# Patient Record
Sex: Female | Born: 1960 | Race: Black or African American | Hispanic: No | Marital: Married | Smoking: Never smoker
Health system: Southern US, Community
[De-identification: ages and names within clinical notes are randomized; demographics above are authoritative.]

## PROBLEM LIST (undated history)

## (undated) DIAGNOSIS — M545 Low back pain, unspecified: Secondary | ICD-10-CM

## (undated) DIAGNOSIS — I639 Cerebral infarction, unspecified: Secondary | ICD-10-CM

## (undated) DIAGNOSIS — Z9889 Other specified postprocedural states: Secondary | ICD-10-CM

## (undated) DIAGNOSIS — E049 Nontoxic goiter, unspecified: Secondary | ICD-10-CM

## (undated) DIAGNOSIS — E7800 Pure hypercholesterolemia, unspecified: Secondary | ICD-10-CM

## (undated) DIAGNOSIS — K8020 Calculus of gallbladder without cholecystitis without obstruction: Secondary | ICD-10-CM

## (undated) DIAGNOSIS — R112 Nausea with vomiting, unspecified: Secondary | ICD-10-CM

## (undated) DIAGNOSIS — G4730 Sleep apnea, unspecified: Secondary | ICD-10-CM

## (undated) DIAGNOSIS — I10 Essential (primary) hypertension: Secondary | ICD-10-CM

## (undated) DIAGNOSIS — K219 Gastro-esophageal reflux disease without esophagitis: Secondary | ICD-10-CM

## (undated) DIAGNOSIS — M797 Fibromyalgia: Secondary | ICD-10-CM

## (undated) DIAGNOSIS — R42 Dizziness and giddiness: Secondary | ICD-10-CM

## (undated) DIAGNOSIS — E669 Obesity, unspecified: Secondary | ICD-10-CM

## (undated) DIAGNOSIS — M1990 Unspecified osteoarthritis, unspecified site: Secondary | ICD-10-CM

## (undated) DIAGNOSIS — E041 Nontoxic single thyroid nodule: Secondary | ICD-10-CM

## (undated) HISTORY — DX: Unspecified osteoarthritis, unspecified site: M19.90

## (undated) HISTORY — PX: OTHER SURGICAL HISTORY: SHX169

## (undated) HISTORY — PX: ABDOMINAL HYSTERECTOMY: SHX81

## (undated) HISTORY — DX: Low back pain, unspecified: M54.50

## (undated) HISTORY — DX: Calculus of gallbladder without cholecystitis without obstruction: K80.20

## (undated) HISTORY — DX: Dizziness and giddiness: R42

## (undated) HISTORY — DX: Cerebral infarction, unspecified: I63.9

## (undated) HISTORY — DX: Low back pain: M54.5

## (undated) HISTORY — PX: KNEE ARTHROSCOPY: SUR90

## (undated) HISTORY — PX: CATARACT EXTRACTION W/ INTRAOCULAR LENS IMPLANT: SHX1309

## (undated) HISTORY — DX: Nontoxic single thyroid nodule: E04.1

## (undated) HISTORY — DX: Nontoxic goiter, unspecified: E04.9

---

## 1998-02-06 ENCOUNTER — Emergency Department (HOSPITAL_COMMUNITY): Admission: EM | Admit: 1998-02-06 | Discharge: 1998-02-06 | Payer: Self-pay | Admitting: Emergency Medicine

## 1998-02-08 ENCOUNTER — Inpatient Hospital Stay (HOSPITAL_COMMUNITY): Admission: RE | Admit: 1998-02-08 | Discharge: 1998-02-09 | Payer: Self-pay | Admitting: General Surgery

## 1998-02-26 ENCOUNTER — Emergency Department (HOSPITAL_COMMUNITY): Admission: EM | Admit: 1998-02-26 | Discharge: 1998-02-26 | Payer: Self-pay | Admitting: Emergency Medicine

## 1998-03-21 ENCOUNTER — Emergency Department (HOSPITAL_COMMUNITY): Admission: EM | Admit: 1998-03-21 | Discharge: 1998-03-21 | Payer: Self-pay | Admitting: Emergency Medicine

## 1998-06-18 ENCOUNTER — Emergency Department (HOSPITAL_COMMUNITY): Admission: EM | Admit: 1998-06-18 | Discharge: 1998-06-18 | Payer: Self-pay | Admitting: Family Medicine

## 1998-07-16 ENCOUNTER — Emergency Department (HOSPITAL_COMMUNITY): Admission: EM | Admit: 1998-07-16 | Discharge: 1998-07-16 | Payer: Self-pay | Admitting: Emergency Medicine

## 1998-09-29 DIAGNOSIS — I639 Cerebral infarction, unspecified: Secondary | ICD-10-CM

## 1998-09-29 HISTORY — DX: Cerebral infarction, unspecified: I63.9

## 1998-11-25 ENCOUNTER — Emergency Department (HOSPITAL_COMMUNITY): Admission: EM | Admit: 1998-11-25 | Discharge: 1998-11-25 | Payer: Self-pay | Admitting: Emergency Medicine

## 1999-02-07 ENCOUNTER — Encounter: Payer: Self-pay | Admitting: Emergency Medicine

## 1999-02-07 ENCOUNTER — Inpatient Hospital Stay (HOSPITAL_COMMUNITY): Admission: EM | Admit: 1999-02-07 | Discharge: 1999-02-08 | Payer: Self-pay | Admitting: Emergency Medicine

## 1999-02-07 ENCOUNTER — Encounter: Payer: Self-pay | Admitting: Family Medicine

## 1999-06-28 ENCOUNTER — Emergency Department (HOSPITAL_COMMUNITY): Admission: EM | Admit: 1999-06-28 | Discharge: 1999-06-28 | Payer: Self-pay | Admitting: Emergency Medicine

## 1999-07-24 ENCOUNTER — Emergency Department (HOSPITAL_COMMUNITY): Admission: EM | Admit: 1999-07-24 | Discharge: 1999-07-24 | Payer: Self-pay | Admitting: Emergency Medicine

## 1999-08-13 ENCOUNTER — Encounter: Admission: RE | Admit: 1999-08-13 | Discharge: 1999-11-11 | Payer: Self-pay | Admitting: *Deleted

## 1999-11-12 ENCOUNTER — Emergency Department (HOSPITAL_COMMUNITY): Admission: EM | Admit: 1999-11-12 | Discharge: 1999-11-12 | Payer: Self-pay

## 1999-11-26 ENCOUNTER — Emergency Department (HOSPITAL_COMMUNITY): Admission: EM | Admit: 1999-11-26 | Discharge: 1999-11-26 | Payer: Self-pay | Admitting: Emergency Medicine

## 1999-12-02 ENCOUNTER — Emergency Department (HOSPITAL_COMMUNITY): Admission: EM | Admit: 1999-12-02 | Discharge: 1999-12-02 | Payer: Self-pay | Admitting: Emergency Medicine

## 1999-12-06 ENCOUNTER — Encounter: Payer: Self-pay | Admitting: Emergency Medicine

## 1999-12-06 ENCOUNTER — Emergency Department (HOSPITAL_COMMUNITY): Admission: EM | Admit: 1999-12-06 | Discharge: 1999-12-06 | Payer: Self-pay | Admitting: Emergency Medicine

## 2000-01-05 ENCOUNTER — Emergency Department (HOSPITAL_COMMUNITY): Admission: EM | Admit: 2000-01-05 | Discharge: 2000-01-05 | Payer: Self-pay | Admitting: Emergency Medicine

## 2000-01-08 ENCOUNTER — Emergency Department (HOSPITAL_COMMUNITY): Admission: EM | Admit: 2000-01-08 | Discharge: 2000-01-08 | Payer: Self-pay | Admitting: Emergency Medicine

## 2000-03-07 ENCOUNTER — Emergency Department (HOSPITAL_COMMUNITY): Admission: EM | Admit: 2000-03-07 | Discharge: 2000-03-07 | Payer: Self-pay | Admitting: Emergency Medicine

## 2000-05-26 ENCOUNTER — Emergency Department (HOSPITAL_COMMUNITY): Admission: EM | Admit: 2000-05-26 | Discharge: 2000-05-26 | Payer: Self-pay | Admitting: *Deleted

## 2001-07-10 ENCOUNTER — Emergency Department (HOSPITAL_COMMUNITY): Admission: EM | Admit: 2001-07-10 | Discharge: 2001-07-10 | Payer: Self-pay

## 2001-08-27 ENCOUNTER — Emergency Department (HOSPITAL_COMMUNITY): Admission: EM | Admit: 2001-08-27 | Discharge: 2001-08-27 | Payer: Self-pay

## 2002-06-13 ENCOUNTER — Emergency Department (HOSPITAL_COMMUNITY): Admission: EM | Admit: 2002-06-13 | Discharge: 2002-06-13 | Payer: Self-pay | Admitting: Emergency Medicine

## 2002-08-13 ENCOUNTER — Emergency Department (HOSPITAL_COMMUNITY): Admission: EM | Admit: 2002-08-13 | Discharge: 2002-08-13 | Payer: Self-pay | Admitting: Emergency Medicine

## 2002-10-30 ENCOUNTER — Emergency Department (HOSPITAL_COMMUNITY): Admission: EM | Admit: 2002-10-30 | Discharge: 2002-10-30 | Payer: Self-pay | Admitting: Emergency Medicine

## 2002-10-30 ENCOUNTER — Encounter: Payer: Self-pay | Admitting: Emergency Medicine

## 2003-02-22 ENCOUNTER — Emergency Department (HOSPITAL_COMMUNITY): Admission: EM | Admit: 2003-02-22 | Discharge: 2003-02-22 | Payer: Self-pay | Admitting: *Deleted

## 2003-03-31 ENCOUNTER — Encounter: Payer: Self-pay | Admitting: Family Medicine

## 2003-03-31 ENCOUNTER — Encounter: Admission: RE | Admit: 2003-03-31 | Discharge: 2003-03-31 | Payer: Self-pay | Admitting: Family Medicine

## 2003-05-10 ENCOUNTER — Encounter: Admission: RE | Admit: 2003-05-10 | Discharge: 2003-05-10 | Payer: Self-pay | Admitting: Infectious Diseases

## 2003-05-11 ENCOUNTER — Encounter: Admission: RE | Admit: 2003-05-11 | Discharge: 2003-05-11 | Payer: Self-pay | Admitting: Internal Medicine

## 2003-05-31 ENCOUNTER — Encounter: Admission: RE | Admit: 2003-05-31 | Discharge: 2003-05-31 | Payer: Self-pay | Admitting: Internal Medicine

## 2003-06-22 ENCOUNTER — Emergency Department (HOSPITAL_COMMUNITY): Admission: EM | Admit: 2003-06-22 | Discharge: 2003-06-22 | Payer: Self-pay | Admitting: *Deleted

## 2003-10-03 ENCOUNTER — Emergency Department (HOSPITAL_COMMUNITY): Admission: EM | Admit: 2003-10-03 | Discharge: 2003-10-03 | Payer: Self-pay | Admitting: Emergency Medicine

## 2003-10-11 ENCOUNTER — Emergency Department (HOSPITAL_COMMUNITY): Admission: EM | Admit: 2003-10-11 | Discharge: 2003-10-12 | Payer: Self-pay | Admitting: Emergency Medicine

## 2003-10-19 ENCOUNTER — Emergency Department (HOSPITAL_COMMUNITY): Admission: AD | Admit: 2003-10-19 | Discharge: 2003-10-19 | Payer: Self-pay | Admitting: Family Medicine

## 2003-11-07 ENCOUNTER — Emergency Department (HOSPITAL_COMMUNITY): Admission: AD | Admit: 2003-11-07 | Discharge: 2003-11-07 | Payer: Self-pay | Admitting: Family Medicine

## 2003-11-15 ENCOUNTER — Emergency Department (HOSPITAL_COMMUNITY): Admission: EM | Admit: 2003-11-15 | Discharge: 2003-11-16 | Payer: Self-pay

## 2003-12-14 ENCOUNTER — Emergency Department (HOSPITAL_COMMUNITY): Admission: EM | Admit: 2003-12-14 | Discharge: 2003-12-14 | Payer: Self-pay | Admitting: Emergency Medicine

## 2003-12-16 ENCOUNTER — Emergency Department (HOSPITAL_COMMUNITY): Admission: EM | Admit: 2003-12-16 | Discharge: 2003-12-17 | Payer: Self-pay | Admitting: Emergency Medicine

## 2004-01-12 ENCOUNTER — Encounter: Admission: RE | Admit: 2004-01-12 | Discharge: 2004-02-05 | Payer: Self-pay | Admitting: Family Medicine

## 2004-02-04 ENCOUNTER — Ambulatory Visit (HOSPITAL_COMMUNITY): Admission: RE | Admit: 2004-02-04 | Discharge: 2004-02-04 | Payer: Self-pay | Admitting: Chiropractor

## 2004-03-21 ENCOUNTER — Emergency Department (HOSPITAL_COMMUNITY): Admission: EM | Admit: 2004-03-21 | Discharge: 2004-03-21 | Payer: Self-pay | Admitting: Emergency Medicine

## 2004-06-19 ENCOUNTER — Ambulatory Visit: Payer: Self-pay | Admitting: *Deleted

## 2004-06-19 ENCOUNTER — Ambulatory Visit: Payer: Self-pay | Admitting: Internal Medicine

## 2004-06-21 ENCOUNTER — Ambulatory Visit: Payer: Self-pay | Admitting: *Deleted

## 2004-07-04 ENCOUNTER — Ambulatory Visit: Payer: Self-pay | Admitting: Internal Medicine

## 2004-07-28 ENCOUNTER — Emergency Department (HOSPITAL_COMMUNITY): Admission: EM | Admit: 2004-07-28 | Discharge: 2004-07-28 | Payer: Self-pay | Admitting: Emergency Medicine

## 2004-07-29 ENCOUNTER — Ambulatory Visit: Payer: Self-pay | Admitting: Internal Medicine

## 2004-08-26 ENCOUNTER — Ambulatory Visit: Payer: Self-pay | Admitting: Internal Medicine

## 2004-09-17 ENCOUNTER — Emergency Department (HOSPITAL_COMMUNITY): Admission: EM | Admit: 2004-09-17 | Discharge: 2004-09-17 | Payer: Self-pay | Admitting: Family Medicine

## 2004-11-07 ENCOUNTER — Ambulatory Visit: Payer: Self-pay | Admitting: Family Medicine

## 2004-11-22 ENCOUNTER — Ambulatory Visit: Payer: Self-pay | Admitting: Family Medicine

## 2004-12-10 ENCOUNTER — Emergency Department (HOSPITAL_COMMUNITY): Admission: EM | Admit: 2004-12-10 | Discharge: 2004-12-10 | Payer: Self-pay | Admitting: Emergency Medicine

## 2004-12-13 ENCOUNTER — Ambulatory Visit: Payer: Self-pay | Admitting: Family Medicine

## 2005-01-05 IMAGING — MR MR CERVICAL SPINE W/O CM
4 of 7 series · 21 of 48 positions shown · non-contrast
Comparison: none

CLINICAL DATA: neck pain, numbness in the right hand and shoulder. 
 MRI OF THE CERVICAL SPINE, WITHOUT CONTRAST
 Multiplanar T1 and T2-weighted imaging was performed.  
 Alignment of the spine is within normal limits.  The patient does not have any significant disc pathology.  The discs bulge minimally at C3-4, C4-5 and C5-6.  None of those levels is there a herniation or any compromise of the canal or foramina.  The patient has a somewhat small canal on a congenital bases.  The subarachnoid space continues to surround the cord at all levels and there is no cord deformity.  The neural foraminal all appear widely patent.  I don?t see any facet pathology or any focal osseous lesion.  
 IMPRESSION 
 Very minimal bulging of the discs at C3-4, 4-5 and 5-6 but without herniation stenosis or neural foraminal compromise.  The facet joints appear. 
 The patient?s spinal canal is small on congenital basis, but there does not appear to be any significant acquired pathology superimposed upon that.

[Series 1: sag loc · axial · 7.0mm · 1.09mm/px · z∈[+0,+140]mm · 3 of 15 slices shown]
[im 3/15]
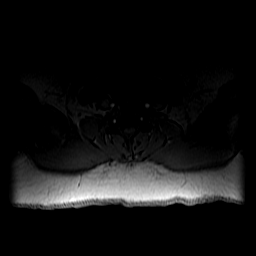
[im 9/15]
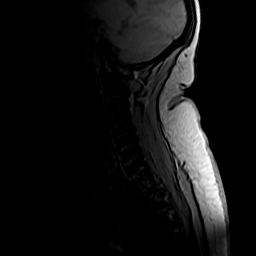
[im 15/15]
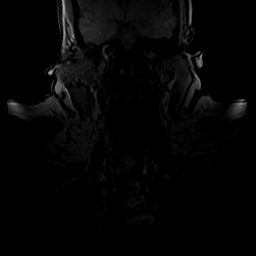

[Series 3: T2 · sagittal · 3.0mm · 0.43mm/px · 6 of 12 slices shown (1 of 2)]
[im 1/12]
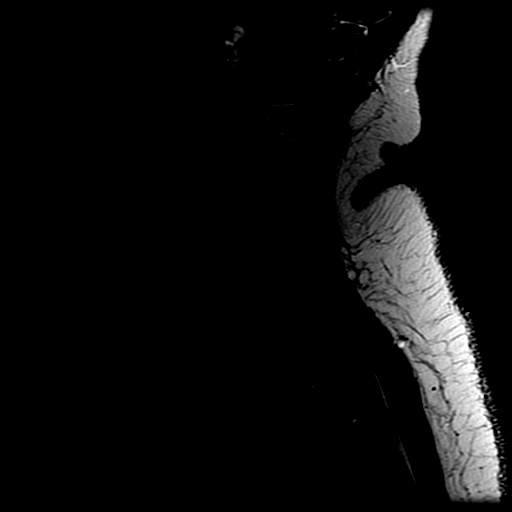
[im 3/12]
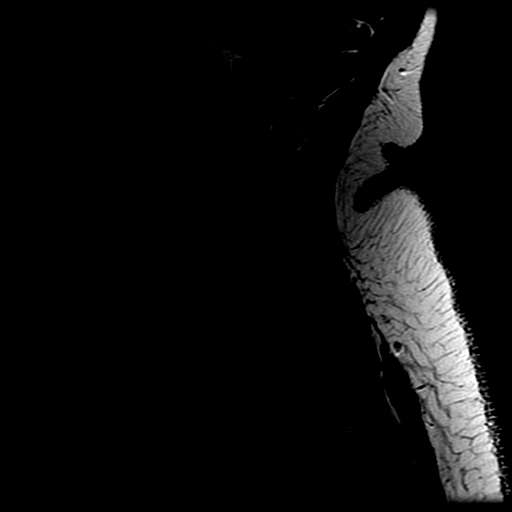
[im 5/12]
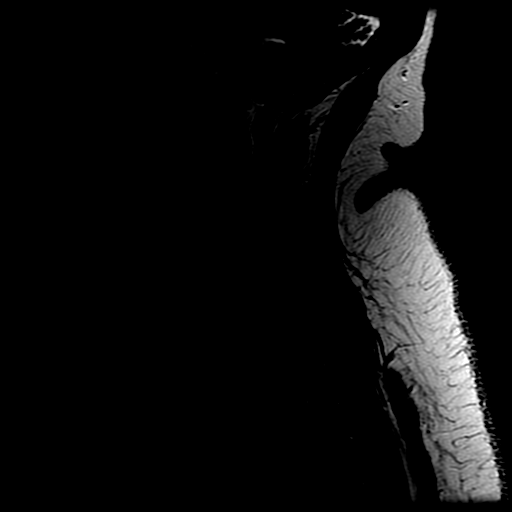
[im 7/12]
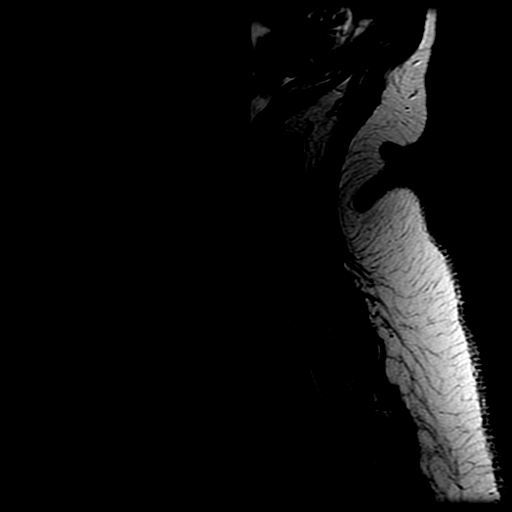
[im 9/12]
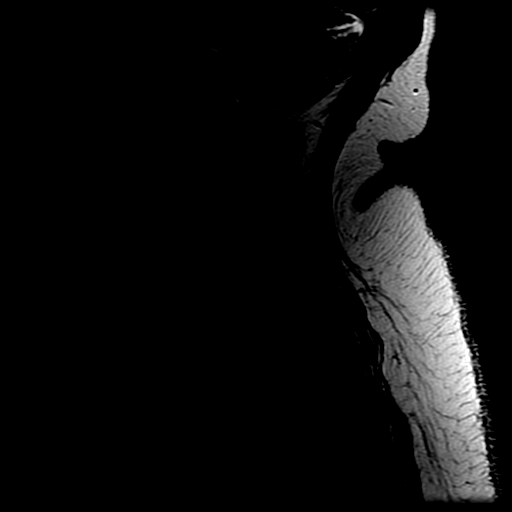
[im 12/12]
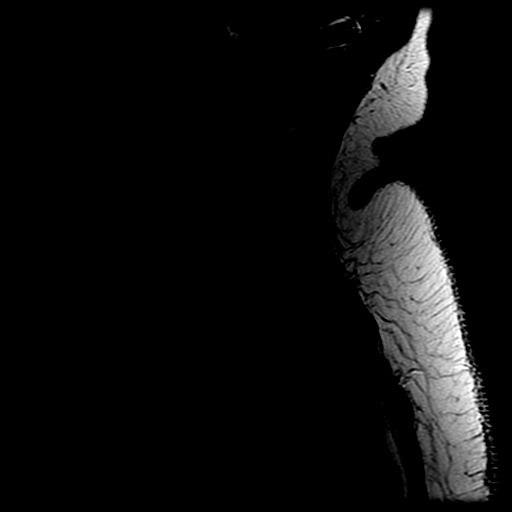

[Series 5: STIR · sagittal · 3.0mm · 0.47mm/px · 3 of 12 slices shown]
[im 3/12]
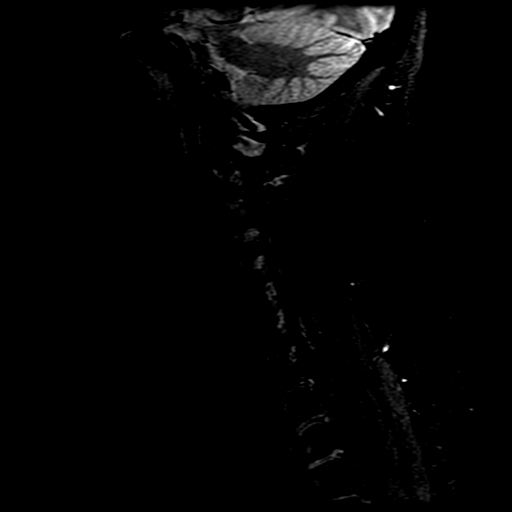
[im 7/12]
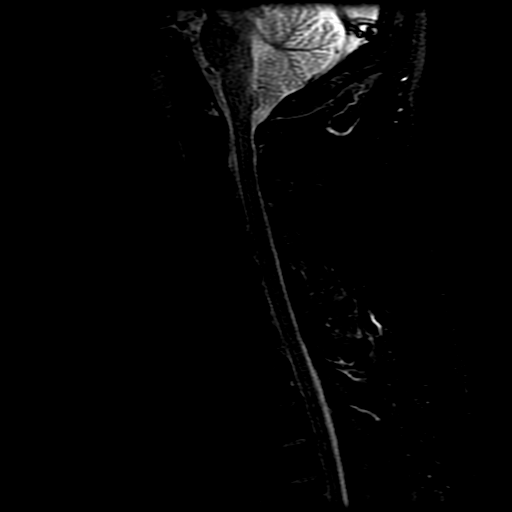
[im 12/12]
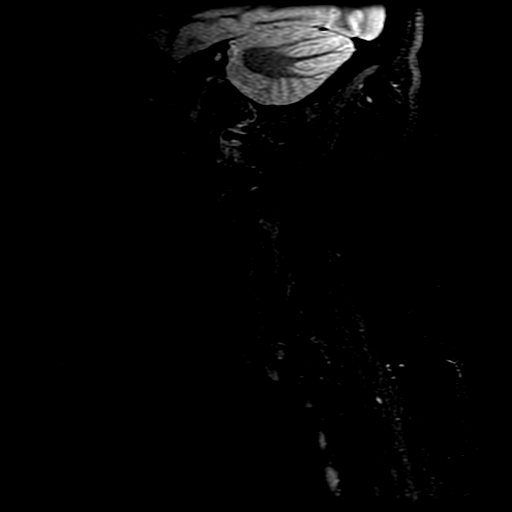

[Series 7: T2 · axial · 3.0mm · 0.39mm/px · z∈[-122,+3]mm · 9 of 20 slices shown (2 of 2)]
[im 1/20]
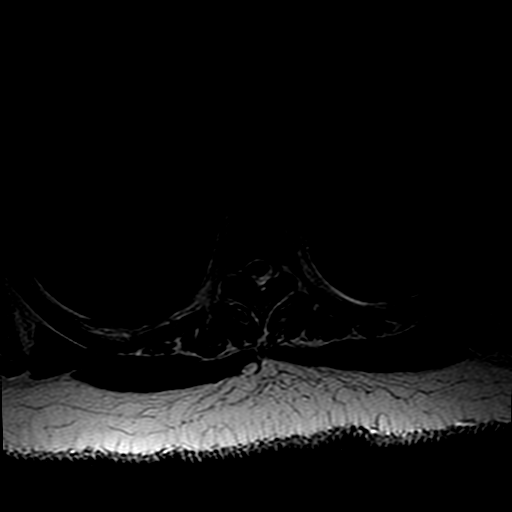
[im 3/20]
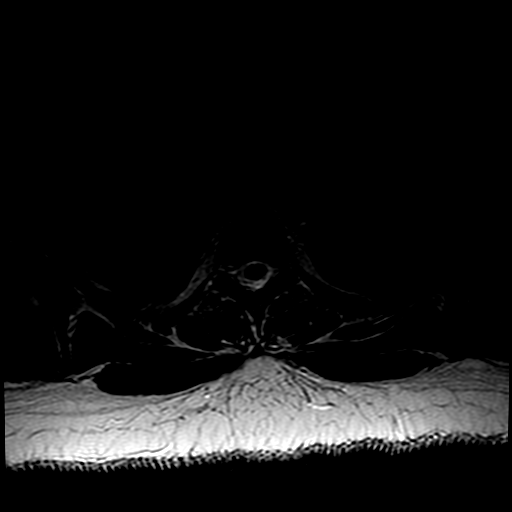
[im 5/20]
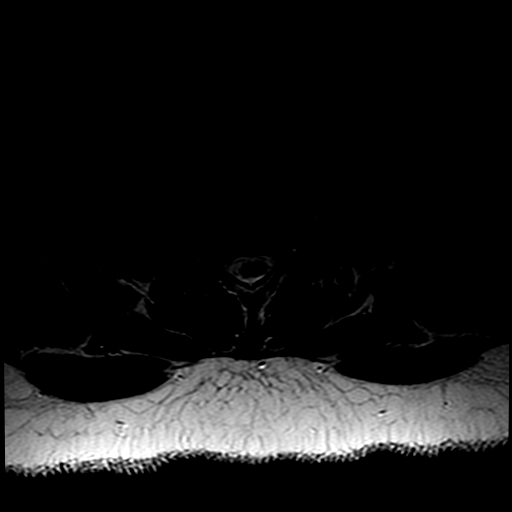
[im 8/20]
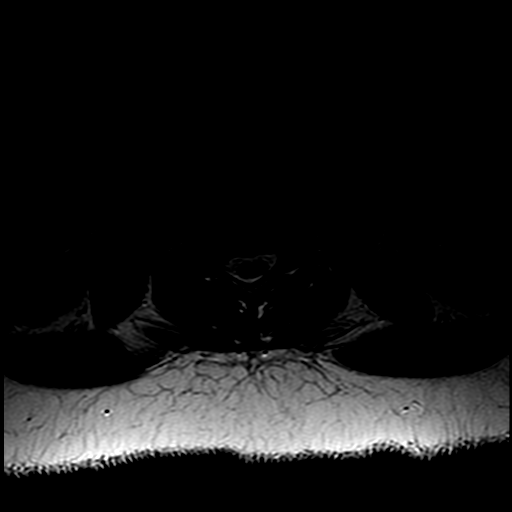
[im 10/20]
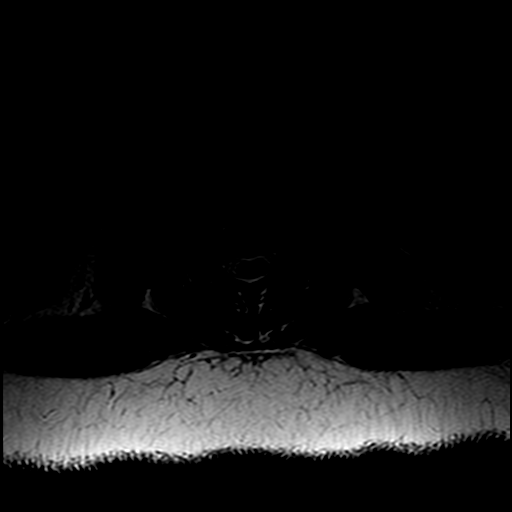
[im 12/20]
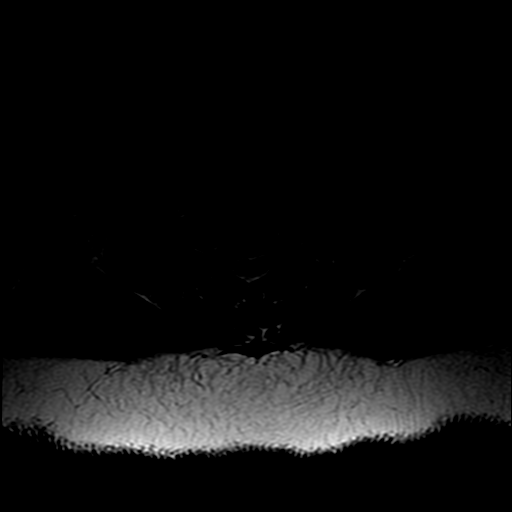
[im 15/20]
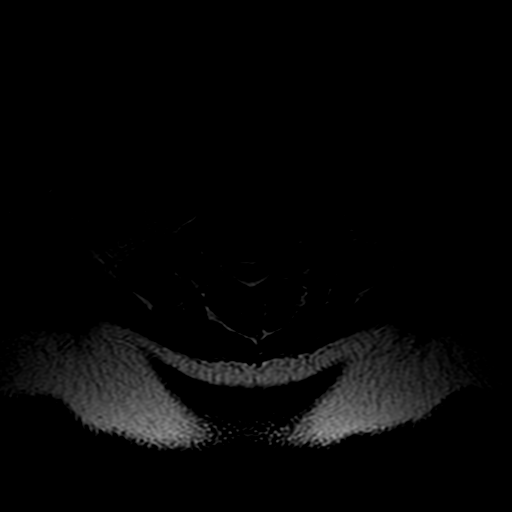
[im 17/20]
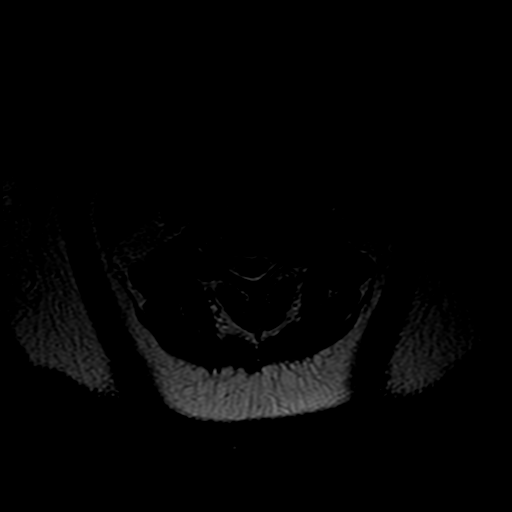
[im 20/20]
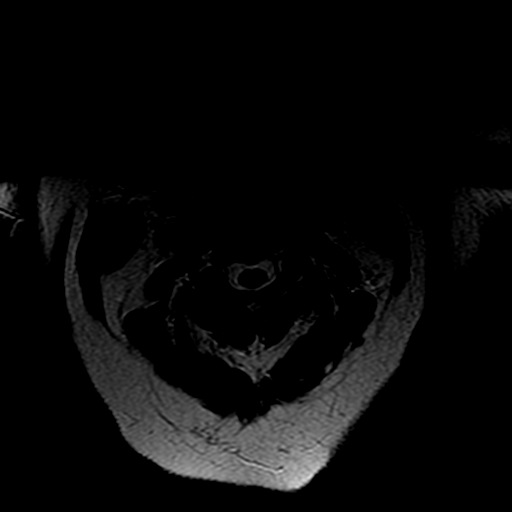

[21 of 48 positions shown; findings below may reference images not displayed]

## 2005-01-11 ENCOUNTER — Emergency Department (HOSPITAL_COMMUNITY): Admission: EM | Admit: 2005-01-11 | Discharge: 2005-01-11 | Payer: Self-pay | Admitting: Emergency Medicine

## 2005-02-17 ENCOUNTER — Ambulatory Visit: Payer: Self-pay | Admitting: Family Medicine

## 2005-05-30 ENCOUNTER — Ambulatory Visit: Payer: Self-pay | Admitting: Family Medicine

## 2005-06-24 ENCOUNTER — Ambulatory Visit: Payer: Self-pay | Admitting: Family Medicine

## 2005-06-25 ENCOUNTER — Ambulatory Visit (HOSPITAL_COMMUNITY): Admission: RE | Admit: 2005-06-25 | Discharge: 2005-06-25 | Payer: Self-pay | Admitting: Family Medicine

## 2005-07-04 ENCOUNTER — Ambulatory Visit: Payer: Self-pay | Admitting: Family Medicine

## 2005-07-06 ENCOUNTER — Emergency Department (HOSPITAL_COMMUNITY): Admission: EM | Admit: 2005-07-06 | Discharge: 2005-07-06 | Payer: Self-pay | Admitting: Emergency Medicine

## 2005-07-12 ENCOUNTER — Emergency Department (HOSPITAL_COMMUNITY): Admission: EM | Admit: 2005-07-12 | Discharge: 2005-07-12 | Payer: Self-pay | Admitting: Emergency Medicine

## 2005-07-31 ENCOUNTER — Encounter: Admission: RE | Admit: 2005-07-31 | Discharge: 2005-07-31 | Payer: Self-pay | Admitting: Family Medicine

## 2005-09-11 ENCOUNTER — Ambulatory Visit: Payer: Self-pay | Admitting: Internal Medicine

## 2005-09-11 DIAGNOSIS — F411 Generalized anxiety disorder: Secondary | ICD-10-CM | POA: Insufficient documentation

## 2005-10-10 ENCOUNTER — Ambulatory Visit: Payer: Self-pay | Admitting: Family Medicine

## 2005-10-13 ENCOUNTER — Ambulatory Visit: Payer: Self-pay | Admitting: Family Medicine

## 2006-01-13 ENCOUNTER — Emergency Department (HOSPITAL_COMMUNITY): Admission: EM | Admit: 2006-01-13 | Discharge: 2006-01-13 | Payer: Self-pay | Admitting: Emergency Medicine

## 2006-02-22 ENCOUNTER — Emergency Department (HOSPITAL_COMMUNITY): Admission: EM | Admit: 2006-02-22 | Discharge: 2006-02-22 | Payer: Self-pay | Admitting: Emergency Medicine

## 2006-05-13 ENCOUNTER — Emergency Department (HOSPITAL_COMMUNITY): Admission: EM | Admit: 2006-05-13 | Discharge: 2006-05-13 | Payer: Self-pay | Admitting: Emergency Medicine

## 2006-10-25 ENCOUNTER — Emergency Department (HOSPITAL_COMMUNITY): Admission: EM | Admit: 2006-10-25 | Discharge: 2006-10-25 | Payer: Self-pay | Admitting: Emergency Medicine

## 2007-01-01 ENCOUNTER — Encounter (INDEPENDENT_AMBULATORY_CARE_PROVIDER_SITE_OTHER): Payer: Self-pay | Admitting: Family Medicine

## 2007-01-01 ENCOUNTER — Ambulatory Visit: Payer: Self-pay | Admitting: Family Medicine

## 2007-01-01 DIAGNOSIS — I10 Essential (primary) hypertension: Secondary | ICD-10-CM | POA: Insufficient documentation

## 2007-01-01 LAB — CONVERTED CEMR LAB
BUN: 14 mg/dL (ref 6–23)
CO2: 21 meq/L (ref 19–32)
Calcium: 9.3 mg/dL (ref 8.4–10.5)
Chloride: 106 meq/L (ref 96–112)
Cholesterol: 168 mg/dL (ref 0–200)
Creatinine, Ser: 0.92 mg/dL (ref 0.40–1.20)
Glucose, Bld: 83 mg/dL (ref 70–99)
Glucose, Urine, Semiquant: NEGATIVE
HDL: 49 mg/dL (ref >39)
LDL Cholesterol: 102 mg/dL — ABNORMAL HIGH (ref 0–99)
Potassium: 4 meq/L (ref 3.5–5.3)
Protein, U semiquant: NEGATIVE
Sodium: 143 meq/L (ref 135–145)
Total CHOL/HDL Ratio: 3.4
Triglycerides: 85 mg/dL (ref <150)
VLDL: 17 mg/dL (ref 0–40)

## 2007-01-20 ENCOUNTER — Encounter (INDEPENDENT_AMBULATORY_CARE_PROVIDER_SITE_OTHER): Payer: Self-pay | Admitting: Family Medicine

## 2007-02-25 ENCOUNTER — Emergency Department (HOSPITAL_COMMUNITY): Admission: EM | Admit: 2007-02-25 | Discharge: 2007-02-25 | Payer: Self-pay | Admitting: Emergency Medicine

## 2007-02-27 ENCOUNTER — Emergency Department (HOSPITAL_COMMUNITY): Admission: EM | Admit: 2007-02-27 | Discharge: 2007-02-27 | Payer: Self-pay | Admitting: Emergency Medicine

## 2007-03-06 ENCOUNTER — Emergency Department (HOSPITAL_COMMUNITY): Admission: EM | Admit: 2007-03-06 | Discharge: 2007-03-07 | Payer: Self-pay | Admitting: Emergency Medicine

## 2007-03-17 DIAGNOSIS — M545 Low back pain, unspecified: Secondary | ICD-10-CM | POA: Insufficient documentation

## 2007-03-17 DIAGNOSIS — J309 Allergic rhinitis, unspecified: Secondary | ICD-10-CM | POA: Insufficient documentation

## 2007-08-07 ENCOUNTER — Encounter (INDEPENDENT_AMBULATORY_CARE_PROVIDER_SITE_OTHER): Payer: Self-pay | Admitting: Emergency Medicine

## 2007-08-07 ENCOUNTER — Ambulatory Visit: Payer: Self-pay | Admitting: Vascular Surgery

## 2007-08-07 ENCOUNTER — Emergency Department (HOSPITAL_COMMUNITY): Admission: EM | Admit: 2007-08-07 | Discharge: 2007-08-07 | Payer: Self-pay | Admitting: Emergency Medicine

## 2008-01-29 IMAGING — CR DG CHEST 2V
2 series · 2 of 2 positions shown · non-contrast
Comparison: none

CLINICAL DATA: Chest and back pain.
 PA AND LATERAL CHEST:

[w chest pa]
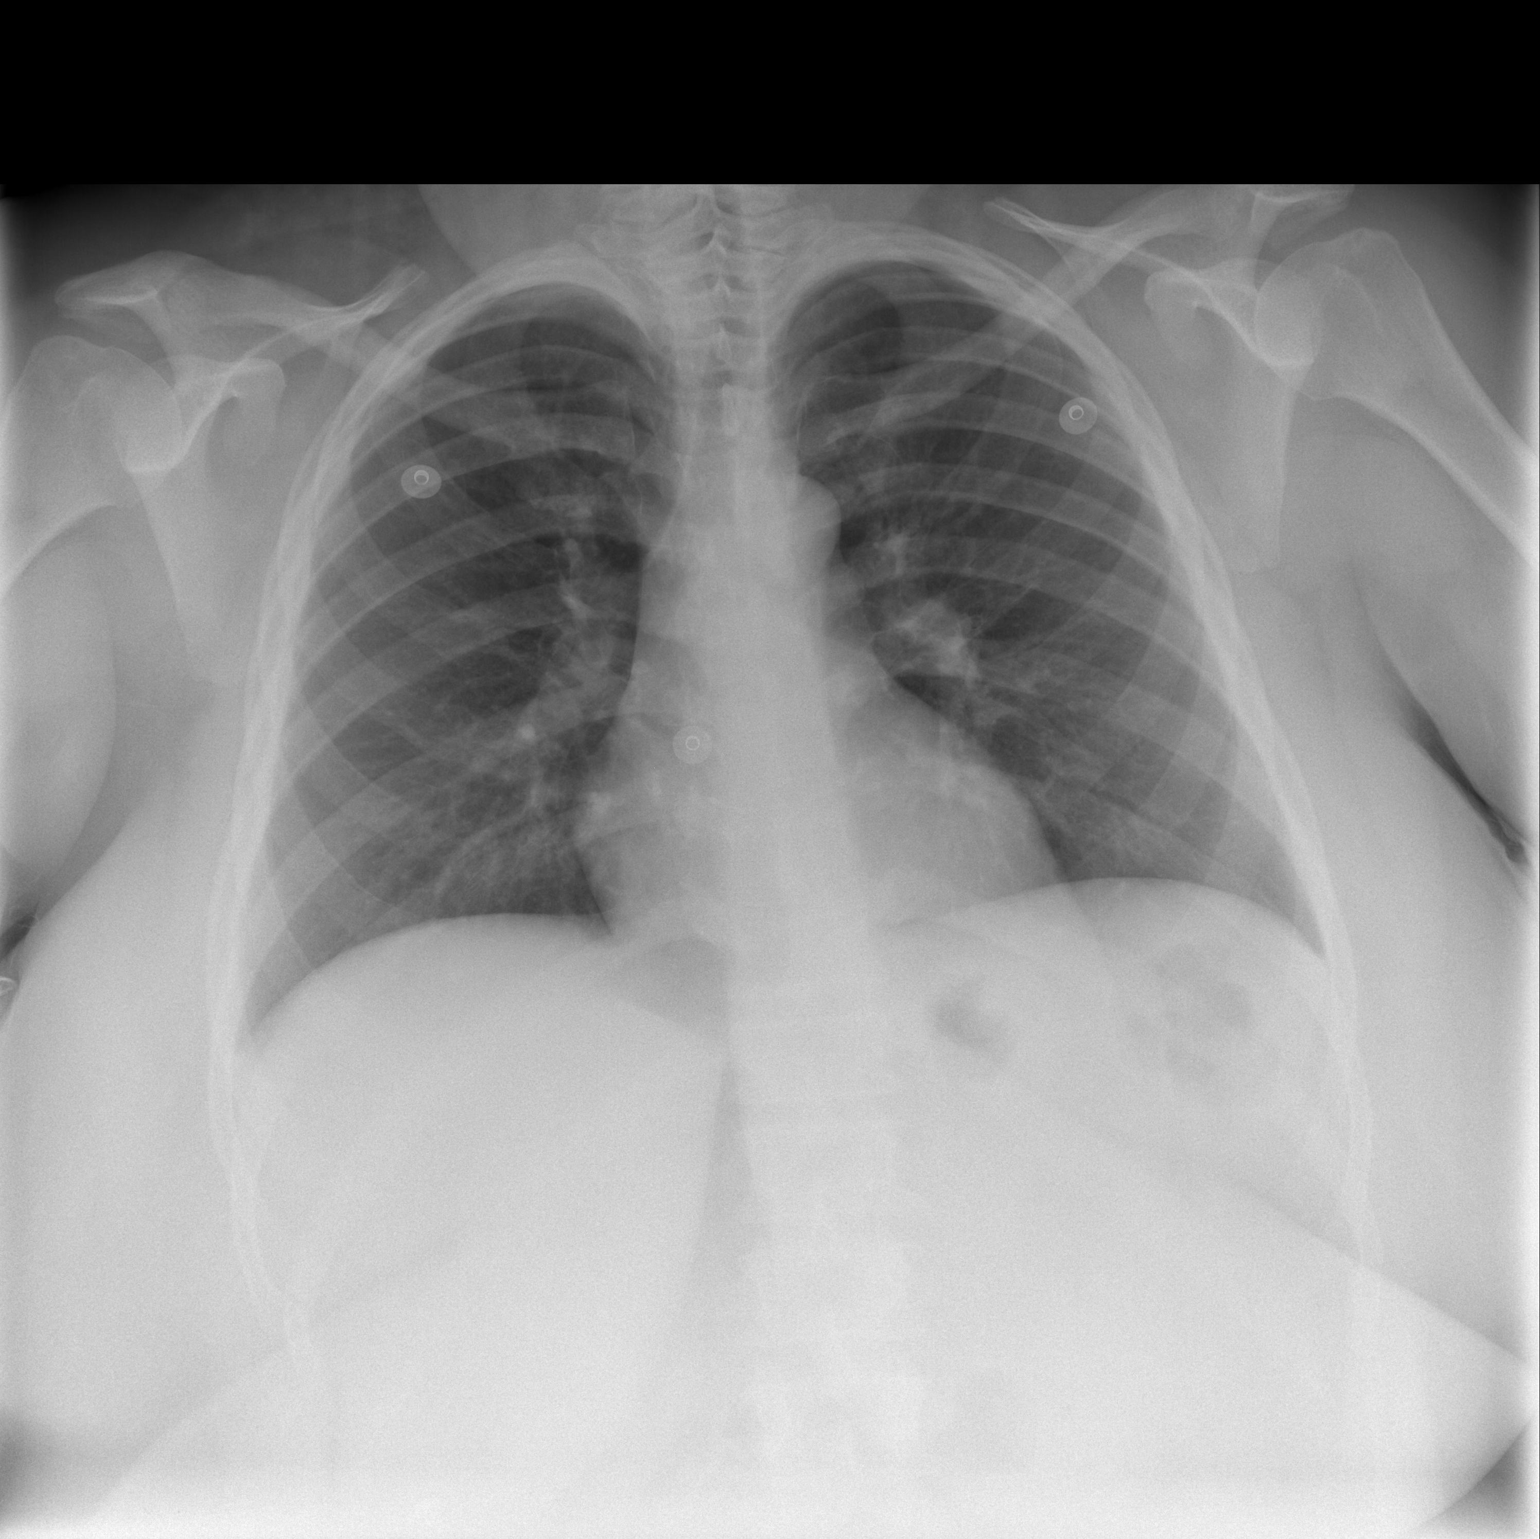

[w chest lat *]
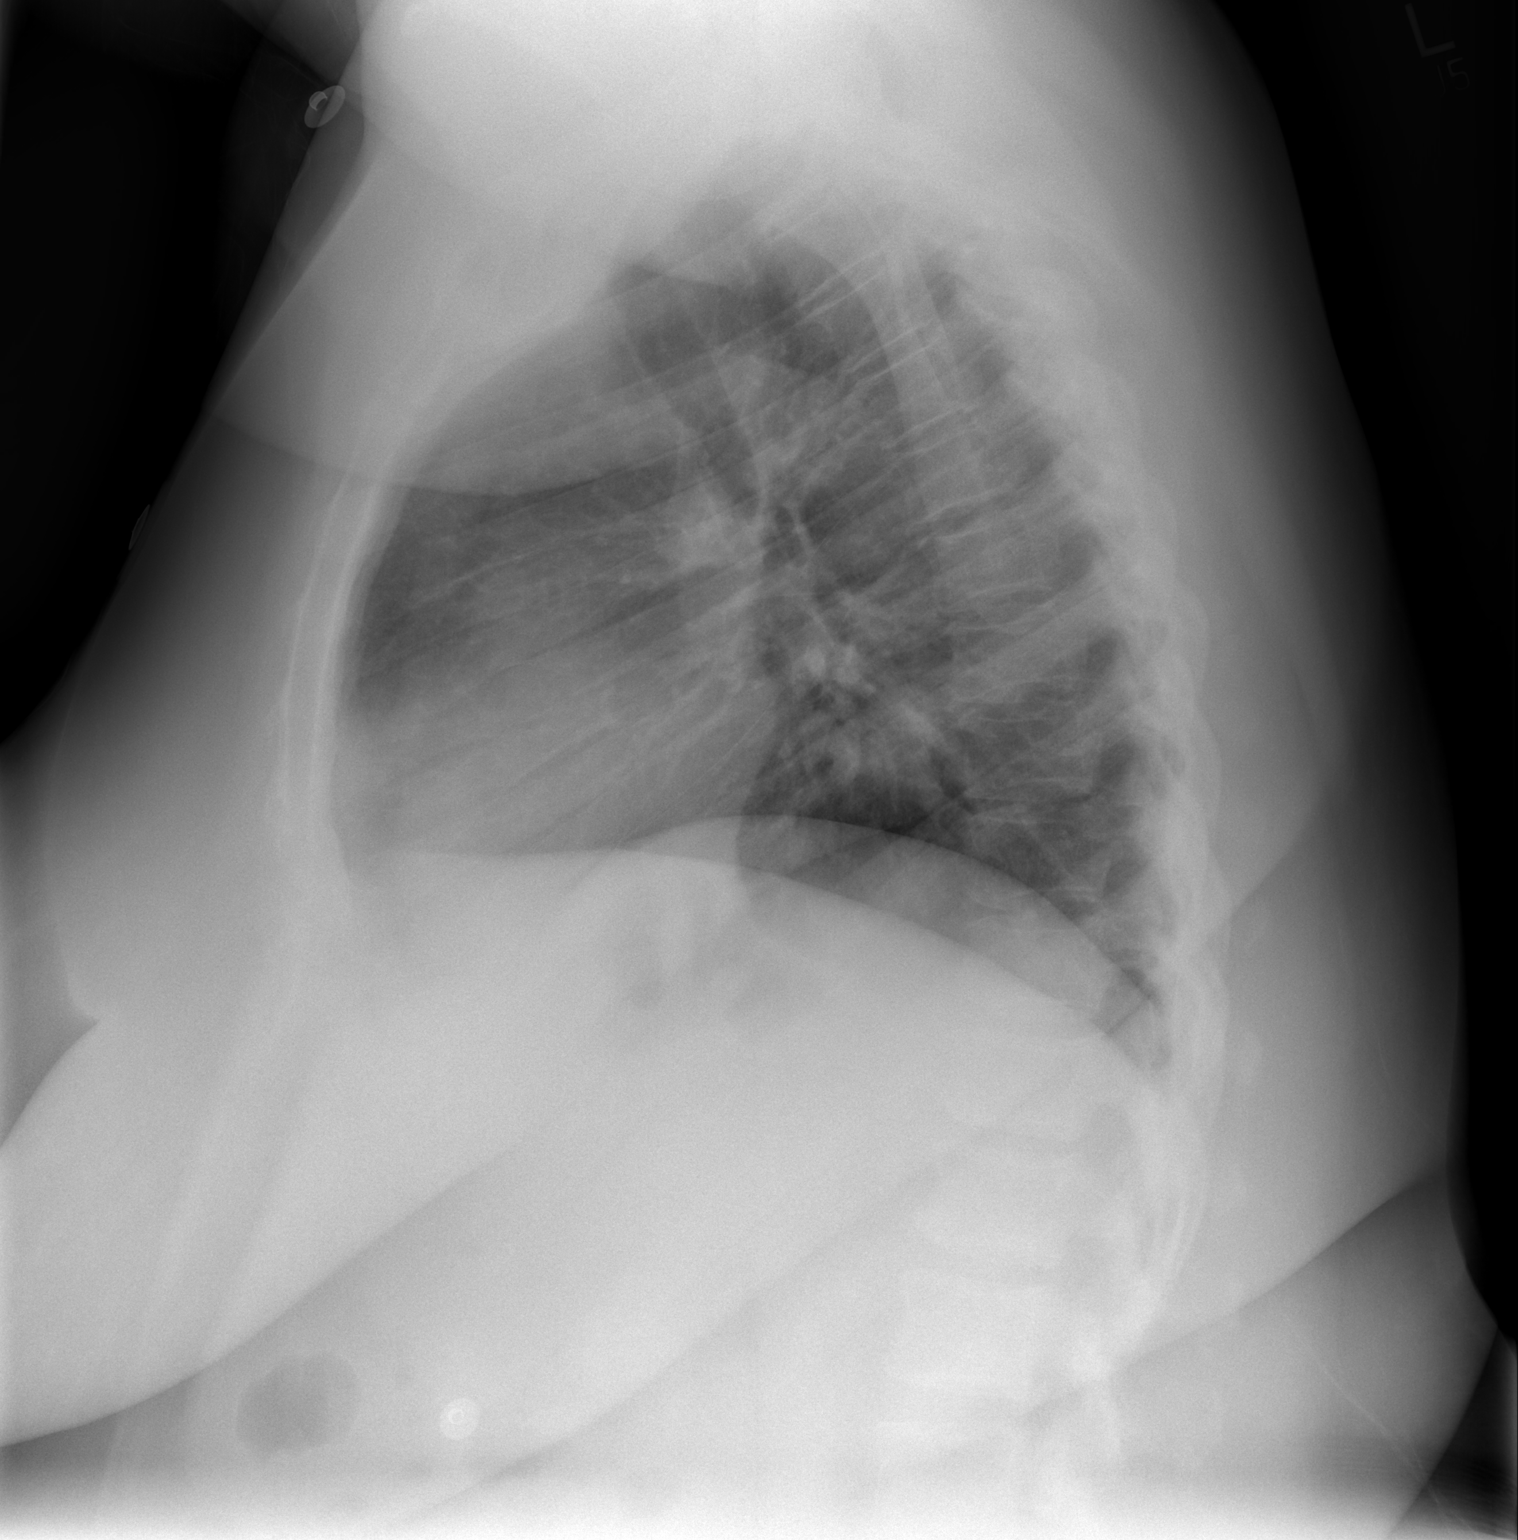

[2 of 2 positions shown; findings below may reference images not displayed]

FINDINGS: Lungs are clear.  Heart size is normal.  No effusion or focal bony abnormality.
IMPRESSION: No acute disease.

## 2008-02-11 ENCOUNTER — Emergency Department (HOSPITAL_COMMUNITY): Admission: EM | Admit: 2008-02-11 | Discharge: 2008-02-11 | Payer: Self-pay | Admitting: Emergency Medicine

## 2008-03-11 ENCOUNTER — Emergency Department (HOSPITAL_COMMUNITY): Admission: EM | Admit: 2008-03-11 | Discharge: 2008-03-11 | Payer: Self-pay | Admitting: Emergency Medicine

## 2008-05-10 ENCOUNTER — Ambulatory Visit: Payer: Self-pay | Admitting: Family Medicine

## 2008-05-10 LAB — CONVERTED CEMR LAB
ALT: 17 units/L (ref 0–35)
AST: 16 units/L (ref 0–37)
Albumin: 4.1 g/dL (ref 3.5–5.2)
Alkaline Phosphatase: 80 units/L (ref 39–117)
BUN: 13 mg/dL (ref 6–23)
Basophils Absolute: 0 10*3/uL (ref 0.0–0.1)
Basophils Relative: 0 % (ref 0–1)
CO2: 24 meq/L (ref 19–32)
Calcium: 9.3 mg/dL (ref 8.4–10.5)
Chloride: 105 meq/L (ref 96–112)
Creatinine, Ser: 0.83 mg/dL (ref 0.40–1.20)
Eosinophils Absolute: 0 10*3/uL (ref 0.0–0.7)
Eosinophils Relative: 0 % (ref 0–5)
Glucose, Bld: 104 mg/dL — ABNORMAL HIGH (ref 70–99)
HCT: 42.5 % (ref 36.0–46.0)
Hemoglobin: 13.8 g/dL (ref 12.0–15.0)
Lymphocytes Relative: 33 % (ref 12–46)
Lymphs Abs: 3.5 10*3/uL (ref 0.7–4.0)
MCHC: 32.5 g/dL (ref 30.0–36.0)
MCV: 87.8 fL (ref 78.0–100.0)
Monocytes Absolute: 0.8 10*3/uL (ref 0.1–1.0)
Monocytes Relative: 7 % (ref 3–12)
Neutro Abs: 6.2 10*3/uL (ref 1.7–7.7)
Neutrophils Relative %: 59 % (ref 43–77)
Platelets: 326 10*3/uL (ref 150–400)
Potassium: 4 meq/L (ref 3.5–5.3)
RBC: 4.84 M/uL (ref 3.87–5.11)
RDW: 15 % (ref 11.5–15.5)
Sodium: 141 meq/L (ref 135–145)
TSH: 1.648 microintl units/mL (ref 0.350–4.50)
Total Bilirubin: 0.5 mg/dL (ref 0.3–1.2)
Total Protein: 7.2 g/dL (ref 6.0–8.3)
WBC: 10.6 10*3/uL — ABNORMAL HIGH (ref 4.0–10.5)

## 2008-05-17 ENCOUNTER — Telehealth (INDEPENDENT_AMBULATORY_CARE_PROVIDER_SITE_OTHER): Payer: Self-pay | Admitting: *Deleted

## 2008-07-29 ENCOUNTER — Inpatient Hospital Stay (HOSPITAL_COMMUNITY): Admission: EM | Admit: 2008-07-29 | Discharge: 2008-07-30 | Payer: Self-pay | Admitting: Emergency Medicine

## 2008-08-02 ENCOUNTER — Telehealth (INDEPENDENT_AMBULATORY_CARE_PROVIDER_SITE_OTHER): Payer: Self-pay | Admitting: *Deleted

## 2008-08-08 ENCOUNTER — Ambulatory Visit (HOSPITAL_COMMUNITY): Admission: RE | Admit: 2008-08-08 | Discharge: 2008-08-08 | Payer: Self-pay | Admitting: Cardiology

## 2008-12-26 ENCOUNTER — Emergency Department (HOSPITAL_COMMUNITY): Admission: EM | Admit: 2008-12-26 | Discharge: 2008-12-26 | Payer: Self-pay | Admitting: Emergency Medicine

## 2009-01-14 ENCOUNTER — Emergency Department (HOSPITAL_COMMUNITY): Admission: EM | Admit: 2009-01-14 | Discharge: 2009-01-15 | Payer: Self-pay | Admitting: Emergency Medicine

## 2009-02-10 IMAGING — CT CT HEAD W/O CM
1 series · 16 of 30 positions shown, 20 images · non-contrast
Comparison: None available

CLINICAL DATA: Severe headache.  Dizziness.

CT HEAD WITHOUT CONTRAST
TECHNIQUE: Contiguous axial images were obtained from the base of
the skull through the vertex without contrast

[Series 2: headseq 4.8 h45s · axial · 0.43mm/px · z∈[-199,-47]mm · 16 of 36 slices shown, 20 images]
[im 2/36  brain]
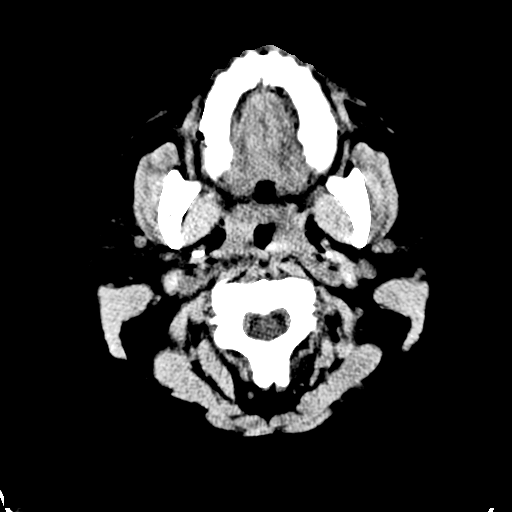
[im 2/36  bone]
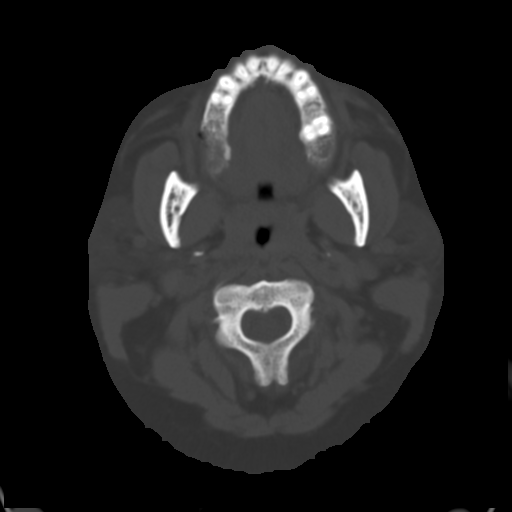
[im 4/36  brain]
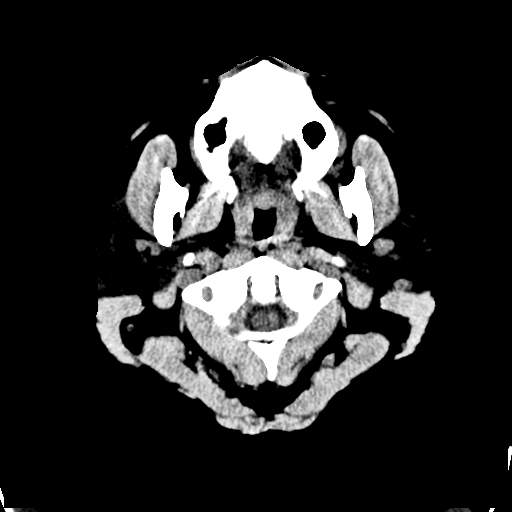
[im 7/36  brain]
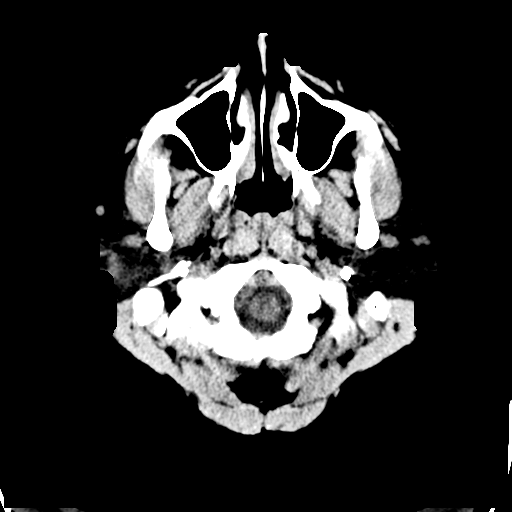
[im 9/36  brain]
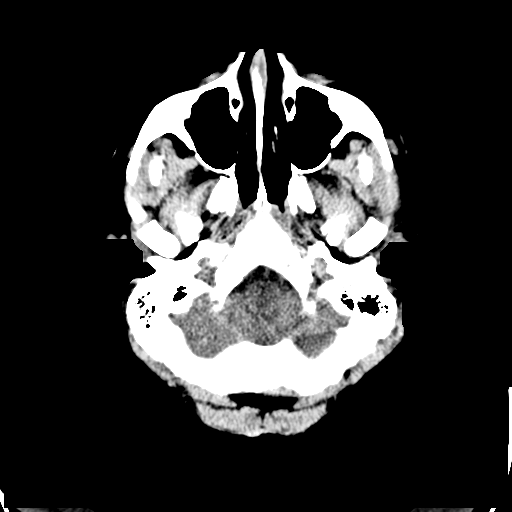
[im 10/36  brain]
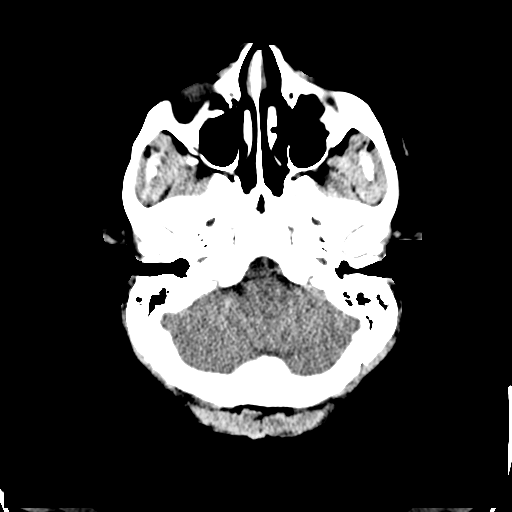
[im 10/36  bone]
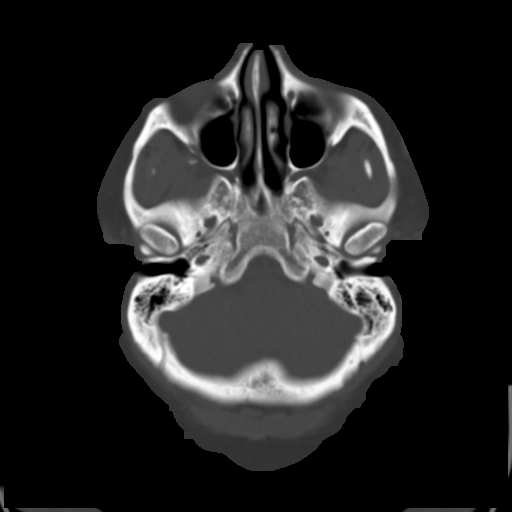
[im 13/36  brain]
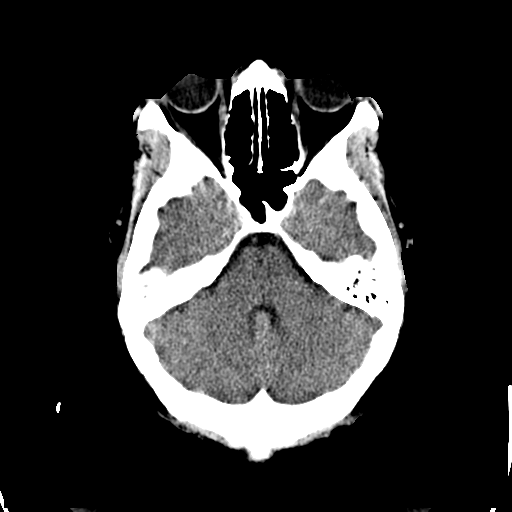
[im 15/36  brain]
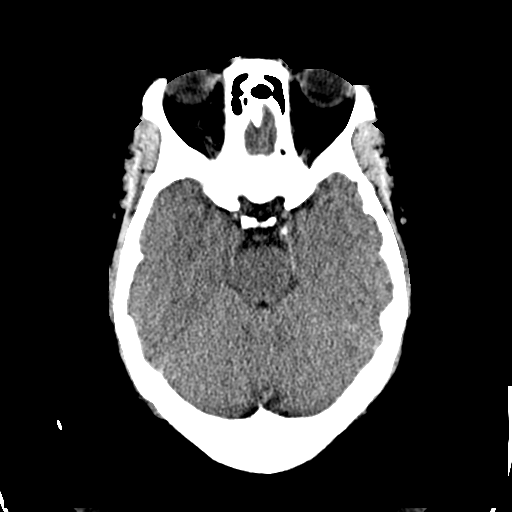
[im 17/36  brain]
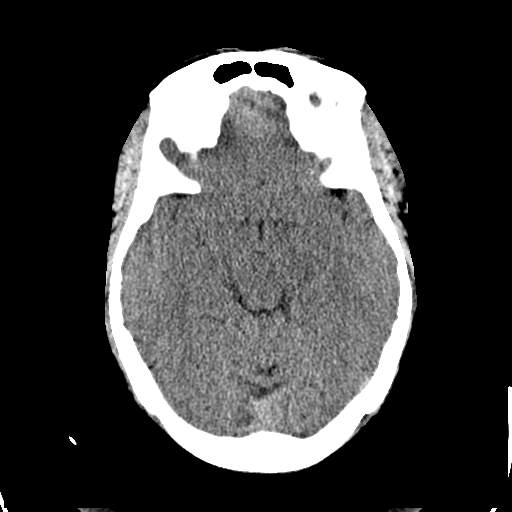
[im 19/36  brain]
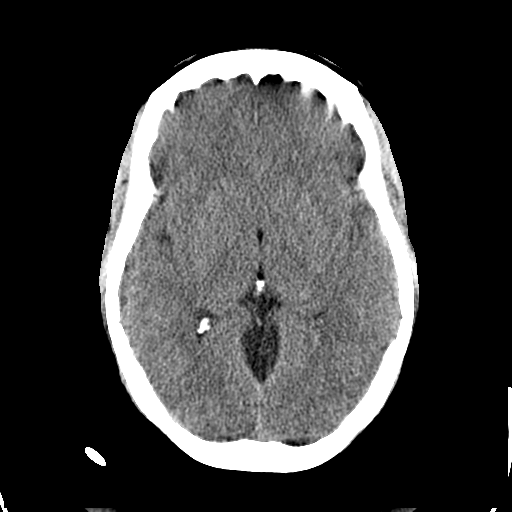
[im 19/36  bone]
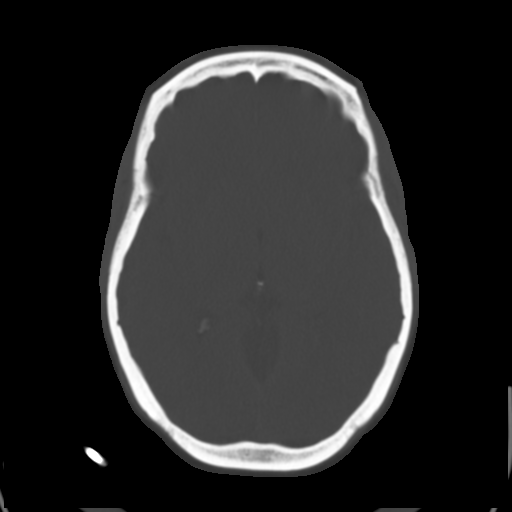
[im 21/36  brain]
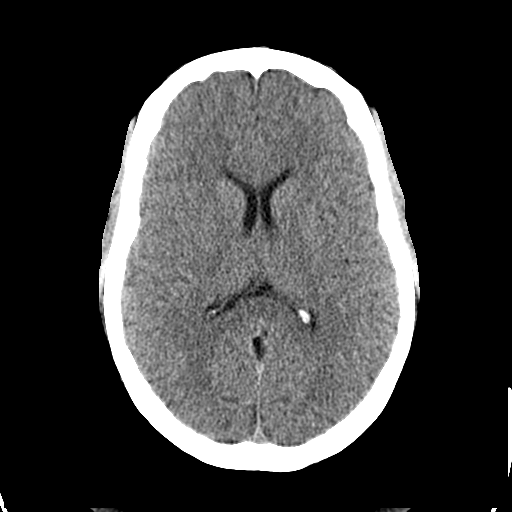
[im 23/36  brain]
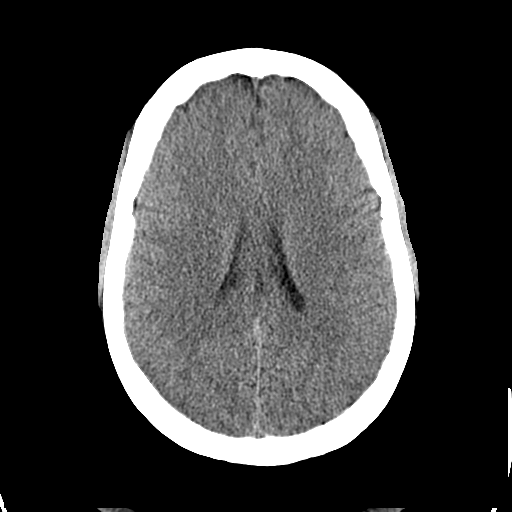
[im 26/36  brain]
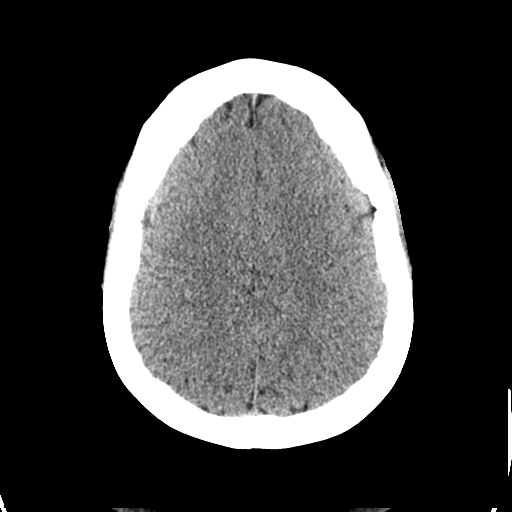
[im 27/36  brain]
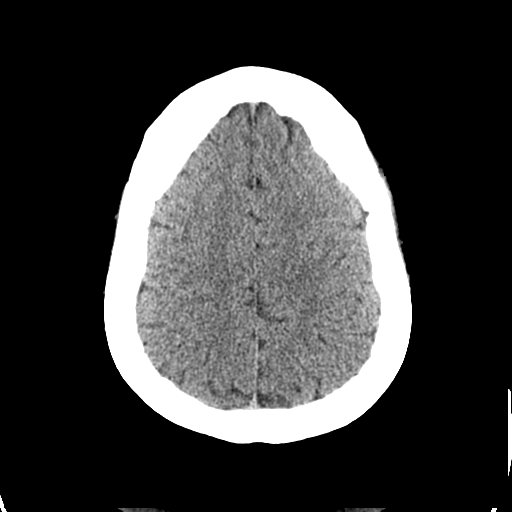
[im 27/36  bone]
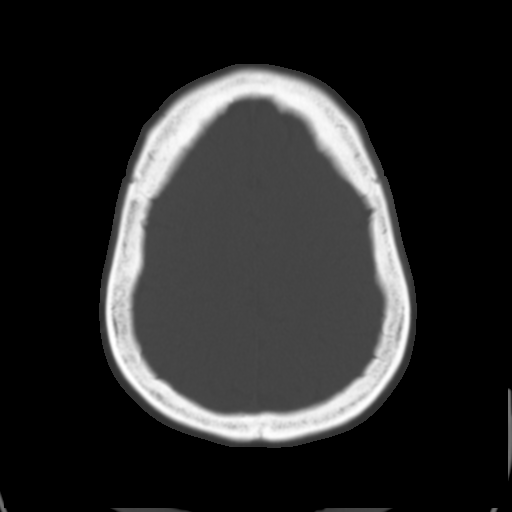
[im 29/36  brain]
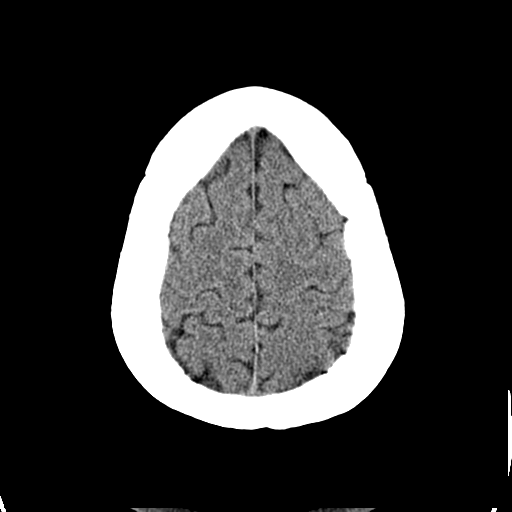
[im 32/36  brain]
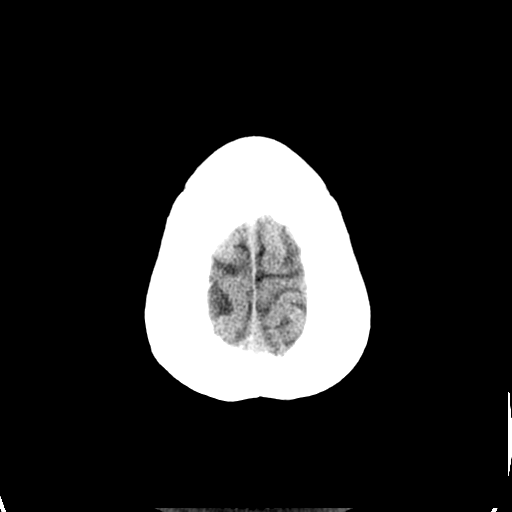
[im 34/36  brain]
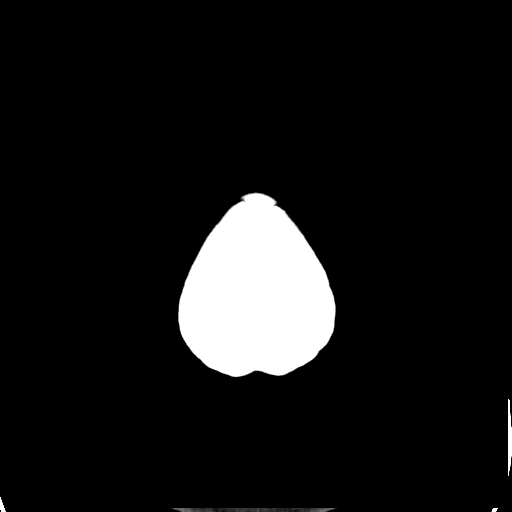

[16 of 30 positions shown; findings below may reference images not displayed]

FINDINGS: There is no evidence of intracranial hemorrhage, brain
edema, or other signs of acute infarction.  There is no evidence of
intracranial mass lesions, or mass effect.  No abnormal extraaxial
fluid collections are identified.  There is no evidence of
hydrocephalus, or other significant intracranial abnormality.  No
skull abnormality identified.
IMPRESSION: Negative non-contrast head CT.

## 2009-03-01 ENCOUNTER — Ambulatory Visit: Payer: Self-pay | Admitting: Nurse Practitioner

## 2009-03-01 DIAGNOSIS — M549 Dorsalgia, unspecified: Secondary | ICD-10-CM | POA: Insufficient documentation

## 2009-03-01 LAB — CONVERTED CEMR LAB
ALT: 21 units/L (ref 0–35)
AST: 18 units/L (ref 0–37)
Albumin: 3.7 g/dL (ref 3.5–5.2)
Alkaline Phosphatase: 68 units/L (ref 39–117)
BUN: 10 mg/dL (ref 6–23)
Basophils Absolute: 0 10*3/uL (ref 0.0–0.1)
Basophils Relative: 0 % (ref 0–1)
Bilirubin Urine: NEGATIVE
Blood in Urine, dipstick: NEGATIVE
CO2: 24 meq/L (ref 19–32)
Calcium: 9 mg/dL (ref 8.4–10.5)
Chloride: 105 meq/L (ref 96–112)
Cholesterol: 161 mg/dL (ref 0–200)
Creatinine, Ser: 0.78 mg/dL (ref 0.40–1.20)
Eosinophils Absolute: 0.1 10*3/uL (ref 0.0–0.7)
Eosinophils Relative: 1 % (ref 0–5)
Glucose, Bld: 74 mg/dL (ref 70–99)
Glucose, Urine, Semiquant: NEGATIVE
HCT: 42.3 % (ref 36.0–46.0)
HDL: 49 mg/dL (ref >39)
Hemoglobin: 13.4 g/dL (ref 12.0–15.0)
Ketones, urine, test strip: NEGATIVE
LDL Cholesterol: 82 mg/dL (ref 0–99)
Lymphocytes Relative: 40 % (ref 12–46)
Lymphs Abs: 3.6 10*3/uL (ref 0.7–4.0)
MCHC: 31.7 g/dL (ref 30.0–36.0)
MCV: 87.9 fL (ref 78.0–100.0)
Microalb, Ur: 0.6 mg/dL (ref 0.00–1.89)
Monocytes Absolute: 0.7 10*3/uL (ref 0.1–1.0)
Monocytes Relative: 8 % (ref 3–12)
Neutro Abs: 4.7 10*3/uL (ref 1.7–7.7)
Neutrophils Relative %: 52 % (ref 43–77)
Nitrite: NEGATIVE
Platelets: 315 10*3/uL (ref 150–400)
Potassium: 4.7 meq/L (ref 3.5–5.3)
Protein, U semiquant: NEGATIVE
RBC: 4.81 M/uL (ref 3.87–5.11)
RDW: 15.9 % — ABNORMAL HIGH (ref 11.5–15.5)
Sodium: 140 meq/L (ref 135–145)
Specific Gravity, Urine: 1.015
TSH: 1.609 microintl units/mL (ref 0.350–4.500)
Total Bilirubin: 0.4 mg/dL (ref 0.3–1.2)
Total CHOL/HDL Ratio: 3.3
Total Protein: 7 g/dL (ref 6.0–8.3)
Triglycerides: 148 mg/dL (ref <150)
Urobilinogen, UA: 1
VLDL: 30 mg/dL (ref 0–40)
WBC Urine, dipstick: NEGATIVE
WBC: 9.1 10*3/uL (ref 4.0–10.5)
pH: 6.5

## 2009-03-02 ENCOUNTER — Encounter (INDEPENDENT_AMBULATORY_CARE_PROVIDER_SITE_OTHER): Payer: Self-pay | Admitting: Nurse Practitioner

## 2009-03-15 ENCOUNTER — Ambulatory Visit: Payer: Self-pay | Admitting: Nurse Practitioner

## 2009-03-21 ENCOUNTER — Ambulatory Visit: Payer: Self-pay | Admitting: Nurse Practitioner

## 2009-03-21 DIAGNOSIS — J029 Acute pharyngitis, unspecified: Secondary | ICD-10-CM | POA: Insufficient documentation

## 2009-03-21 LAB — CONVERTED CEMR LAB: Rapid Strep: NEGATIVE

## 2009-04-12 ENCOUNTER — Emergency Department (HOSPITAL_COMMUNITY): Admission: EM | Admit: 2009-04-12 | Discharge: 2009-04-13 | Payer: Self-pay | Admitting: Emergency Medicine

## 2009-04-12 DIAGNOSIS — S99919A Unspecified injury of unspecified ankle, initial encounter: Secondary | ICD-10-CM

## 2009-04-12 DIAGNOSIS — S99929A Unspecified injury of unspecified foot, initial encounter: Secondary | ICD-10-CM

## 2009-04-12 DIAGNOSIS — S8990XA Unspecified injury of unspecified lower leg, initial encounter: Secondary | ICD-10-CM | POA: Insufficient documentation

## 2009-04-15 ENCOUNTER — Emergency Department (HOSPITAL_COMMUNITY): Admission: EM | Admit: 2009-04-15 | Discharge: 2009-04-15 | Payer: Self-pay | Admitting: Emergency Medicine

## 2009-04-23 ENCOUNTER — Ambulatory Visit: Payer: Self-pay | Admitting: Family Medicine

## 2009-05-12 ENCOUNTER — Emergency Department (HOSPITAL_COMMUNITY): Admission: EM | Admit: 2009-05-12 | Discharge: 2009-05-12 | Payer: Self-pay | Admitting: Emergency Medicine

## 2009-06-13 ENCOUNTER — Encounter (INDEPENDENT_AMBULATORY_CARE_PROVIDER_SITE_OTHER): Payer: Self-pay | Admitting: Family Medicine

## 2009-07-03 ENCOUNTER — Encounter: Payer: Self-pay | Admitting: Internal Medicine

## 2009-07-28 ENCOUNTER — Emergency Department (HOSPITAL_COMMUNITY): Admission: EM | Admit: 2009-07-28 | Discharge: 2009-07-28 | Payer: Self-pay | Admitting: Emergency Medicine

## 2009-10-26 ENCOUNTER — Emergency Department (HOSPITAL_COMMUNITY): Admission: EM | Admit: 2009-10-26 | Discharge: 2009-10-26 | Payer: Self-pay | Admitting: Emergency Medicine

## 2009-11-19 ENCOUNTER — Emergency Department (HOSPITAL_COMMUNITY): Admission: EM | Admit: 2009-11-19 | Discharge: 2009-11-19 | Payer: Self-pay | Admitting: Emergency Medicine

## 2010-01-19 ENCOUNTER — Emergency Department (HOSPITAL_COMMUNITY): Admission: EM | Admit: 2010-01-19 | Discharge: 2010-01-19 | Payer: Self-pay | Admitting: Emergency Medicine

## 2010-01-29 ENCOUNTER — Telehealth: Payer: Self-pay | Admitting: Physician Assistant

## 2010-01-29 ENCOUNTER — Ambulatory Visit: Payer: Self-pay | Admitting: Physician Assistant

## 2010-01-29 DIAGNOSIS — D179 Benign lipomatous neoplasm, unspecified: Secondary | ICD-10-CM | POA: Insufficient documentation

## 2010-01-29 DIAGNOSIS — R519 Headache, unspecified: Secondary | ICD-10-CM | POA: Insufficient documentation

## 2010-01-29 DIAGNOSIS — R51 Headache: Secondary | ICD-10-CM | POA: Insufficient documentation

## 2010-01-29 LAB — CONVERTED CEMR LAB: Rapid HIV Screen: NEGATIVE

## 2010-01-30 ENCOUNTER — Telehealth: Payer: Self-pay | Admitting: Physician Assistant

## 2010-01-31 ENCOUNTER — Ambulatory Visit: Payer: Self-pay | Admitting: Physician Assistant

## 2010-02-01 ENCOUNTER — Encounter: Payer: Self-pay | Admitting: Physician Assistant

## 2010-02-01 LAB — CONVERTED CEMR LAB
ALT: 15 units/L (ref 0–35)
AST: 15 units/L (ref 0–37)
Albumin: 4.2 g/dL (ref 3.5–5.2)
Alkaline Phosphatase: 71 units/L (ref 39–117)
BUN: 17 mg/dL (ref 6–23)
Basophils Absolute: 0 10*3/uL (ref 0.0–0.1)
Basophils Relative: 0 % (ref 0–1)
CO2: 27 meq/L (ref 19–32)
Calcium: 9.8 mg/dL (ref 8.4–10.5)
Chloride: 101 meq/L (ref 96–112)
Cholesterol, target level: 200 mg/dL
Cholesterol: 173 mg/dL (ref 0–200)
Creatinine, Ser: 0.77 mg/dL (ref 0.40–1.20)
Eosinophils Absolute: 0.2 10*3/uL (ref 0.0–0.7)
Eosinophils Relative: 1 % (ref 0–5)
Glucose, Bld: 79 mg/dL (ref 70–99)
HCT: 43.9 % (ref 36.0–46.0)
HDL goal, serum: 40 mg/dL
HDL: 52 mg/dL (ref >39)
Hemoglobin: 14 g/dL (ref 12.0–15.0)
LDL Cholesterol: 101 mg/dL — ABNORMAL HIGH (ref 0–99)
LDL Goal: 160 mg/dL
Lymphocytes Relative: 35 % (ref 12–46)
Lymphs Abs: 3.9 10*3/uL (ref 0.7–4.0)
MCHC: 31.9 g/dL (ref 30.0–36.0)
MCV: 89 fL (ref 78.0–100.0)
Monocytes Absolute: 0.7 10*3/uL (ref 0.1–1.0)
Monocytes Relative: 6 % (ref 3–12)
Neutro Abs: 6.4 10*3/uL (ref 1.7–7.7)
Neutrophils Relative %: 57 % (ref 43–77)
Platelets: 343 10*3/uL (ref 150–400)
Potassium: 5 meq/L (ref 3.5–5.3)
RBC: 4.93 M/uL (ref 3.87–5.11)
RDW: 15.8 % — ABNORMAL HIGH (ref 11.5–15.5)
Sodium: 141 meq/L (ref 135–145)
TSH: 1.462 microintl units/mL (ref 0.350–4.500)
Total Bilirubin: 0.3 mg/dL (ref 0.3–1.2)
Total CHOL/HDL Ratio: 3.3
Total Protein: 7.2 g/dL (ref 6.0–8.3)
Triglycerides: 99 mg/dL (ref <150)
VLDL: 20 mg/dL (ref 0–40)
WBC: 11.2 10*3/uL — ABNORMAL HIGH (ref 4.0–10.5)

## 2010-02-04 ENCOUNTER — Ambulatory Visit (HOSPITAL_COMMUNITY): Admission: RE | Admit: 2010-02-04 | Discharge: 2010-02-04 | Payer: Self-pay | Admitting: Internal Medicine

## 2010-02-06 ENCOUNTER — Encounter: Admission: RE | Admit: 2010-02-06 | Discharge: 2010-02-06 | Payer: Self-pay | Admitting: Internal Medicine

## 2010-02-11 ENCOUNTER — Inpatient Hospital Stay (HOSPITAL_COMMUNITY): Admission: EM | Admit: 2010-02-11 | Discharge: 2010-02-12 | Payer: Self-pay | Admitting: Emergency Medicine

## 2010-02-12 ENCOUNTER — Encounter (INDEPENDENT_AMBULATORY_CARE_PROVIDER_SITE_OTHER): Payer: Self-pay | Admitting: Internal Medicine

## 2010-02-12 ENCOUNTER — Ambulatory Visit: Payer: Self-pay | Admitting: Surgery

## 2010-02-15 ENCOUNTER — Encounter: Payer: Self-pay | Admitting: Physician Assistant

## 2010-02-22 ENCOUNTER — Encounter: Payer: Self-pay | Admitting: Physician Assistant

## 2010-03-12 ENCOUNTER — Ambulatory Visit: Payer: Self-pay | Admitting: Physician Assistant

## 2010-03-12 DIAGNOSIS — I6529 Occlusion and stenosis of unspecified carotid artery: Secondary | ICD-10-CM | POA: Insufficient documentation

## 2010-03-12 DIAGNOSIS — G43709 Chronic migraine without aura, not intractable, without status migrainosus: Secondary | ICD-10-CM

## 2010-03-12 DIAGNOSIS — IMO0002 Reserved for concepts with insufficient information to code with codable children: Secondary | ICD-10-CM | POA: Insufficient documentation

## 2010-03-12 DIAGNOSIS — E739 Lactose intolerance, unspecified: Secondary | ICD-10-CM | POA: Insufficient documentation

## 2010-03-12 DIAGNOSIS — I6350 Cerebral infarction due to unspecified occlusion or stenosis of unspecified cerebral artery: Secondary | ICD-10-CM | POA: Insufficient documentation

## 2010-03-12 DIAGNOSIS — E785 Hyperlipidemia, unspecified: Secondary | ICD-10-CM | POA: Insufficient documentation

## 2010-03-14 IMAGING — CR DG ANKLE COMPLETE 3+V*R*
3 series · 3 of 3 positions shown · non-contrast
Comparison: None

CLINICAL DATA: Ankle injury.  Pain and swelling.

RIGHT ANKLE - COMPLETE 3+ VIEW

[t ankle joint ap right]
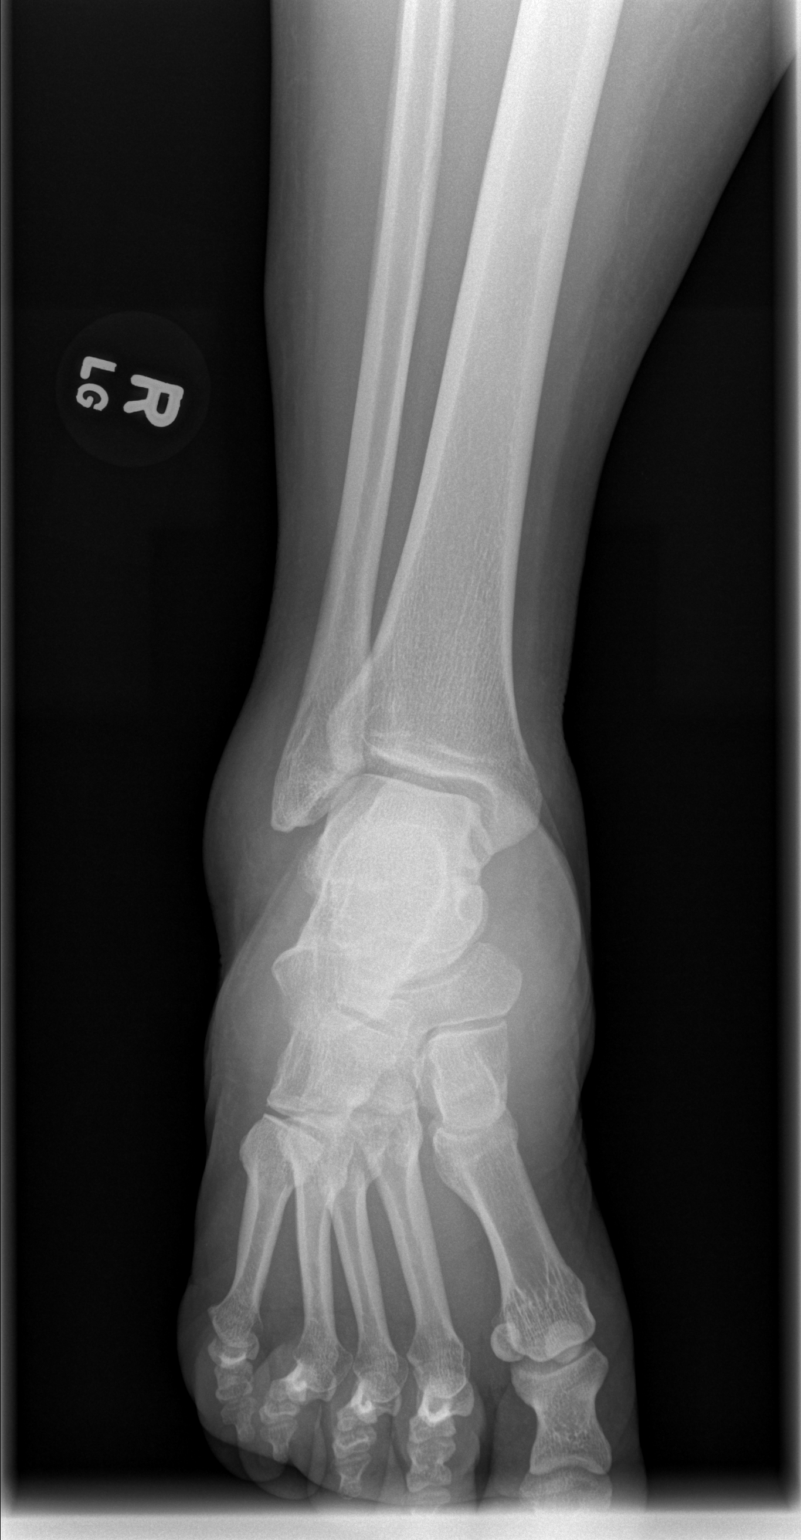

[t ankle joint oblique right]
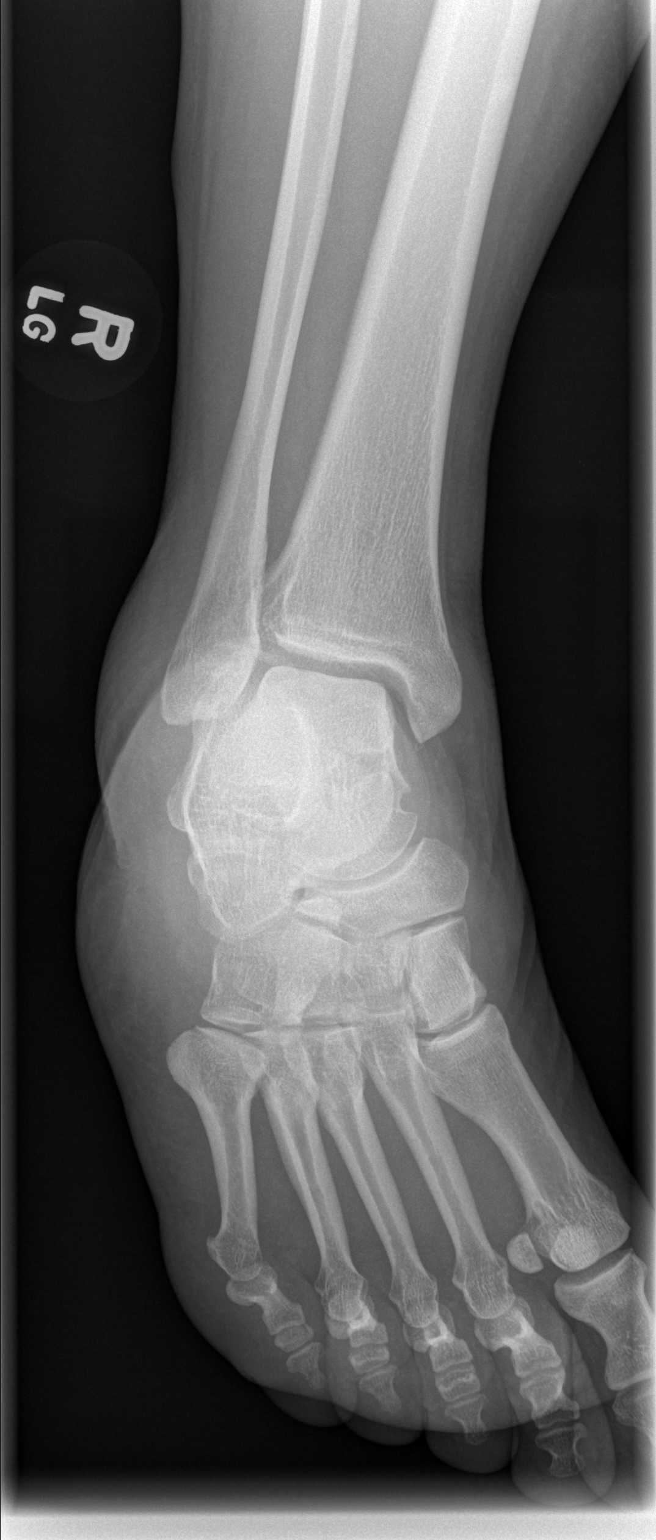

[t ankle joint lat right]
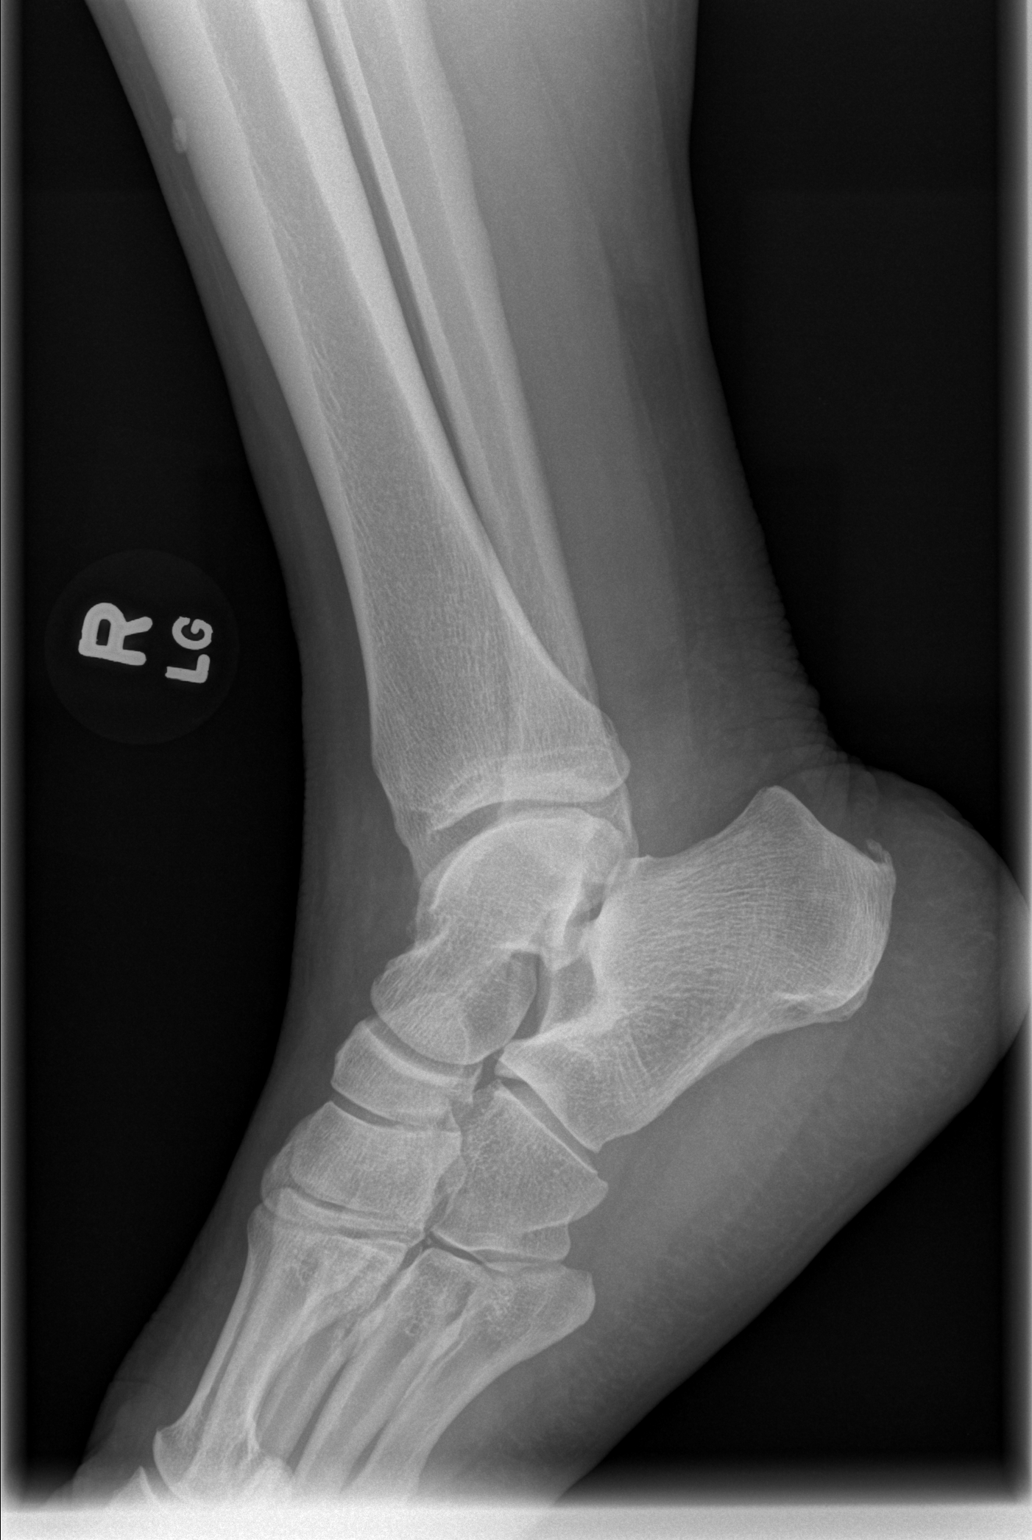

[3 of 3 positions shown; findings below may reference images not displayed]

FINDINGS: Moderate soft tissue swelling is seen overlying the
lateral malleolus.  There is no evidence for or dislocation.  No
other significant bone abnormality identified.
IMPRESSION: Lateral soft tissue swelling.  No evidence of fracture.

## 2010-04-02 ENCOUNTER — Ambulatory Visit: Payer: Self-pay | Admitting: Physician Assistant

## 2010-04-06 ENCOUNTER — Emergency Department (HOSPITAL_COMMUNITY): Admission: EM | Admit: 2010-04-06 | Discharge: 2010-04-06 | Payer: Self-pay | Admitting: Emergency Medicine

## 2010-04-13 IMAGING — CT CT HEAD W/O CM
1 series · 16 of 30 positions shown, 20 images · non-contrast
Comparison: 03/11/2008

CLINICAL DATA: Migraine headaches.

CT HEAD WITHOUT CONTRAST
TECHNIQUE: Contiguous axial images were obtained from the base of
the skull through the vertex without contrast.

[Series 2: head_seq 4.5 h37s st · axial · 0.43mm/px · z∈[+1078,+1204]mm · 16 of 32 slices shown, 20 images]
[im 2/32  brain]
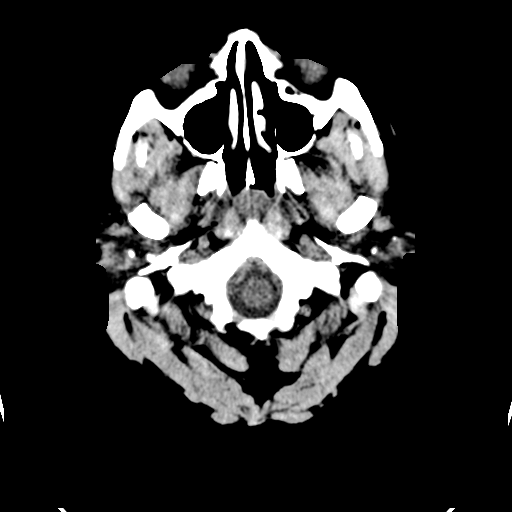
[im 2/32  bone]
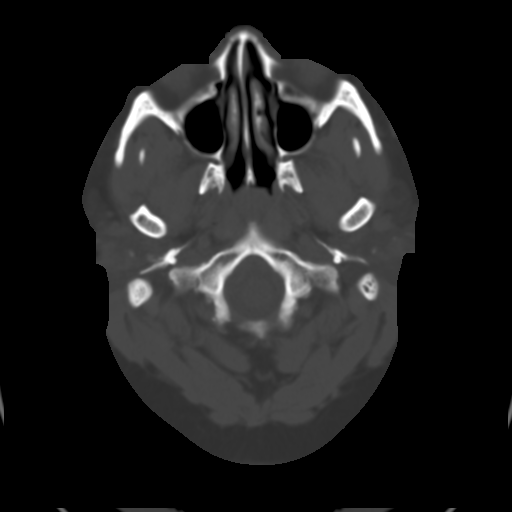
[im 4/32  brain]
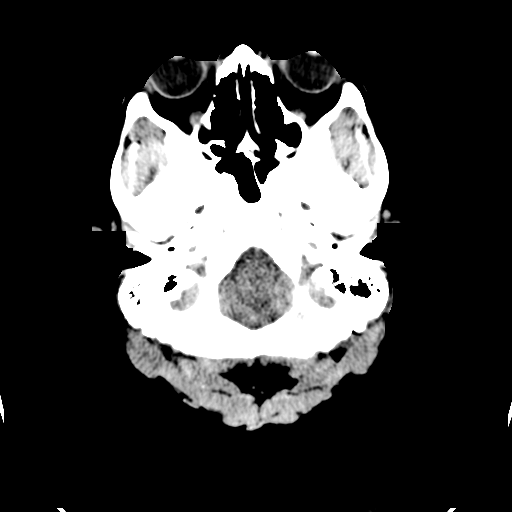
[im 6/32  brain]
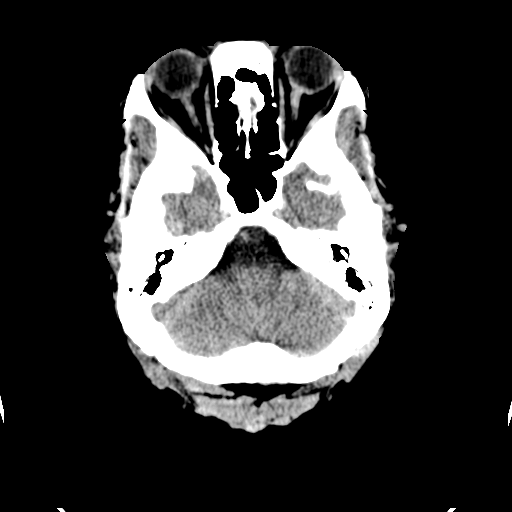
[im 8/32  brain]
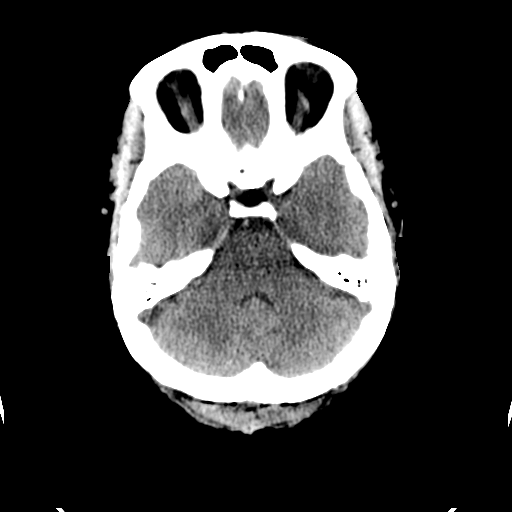
[im 9/32  brain]
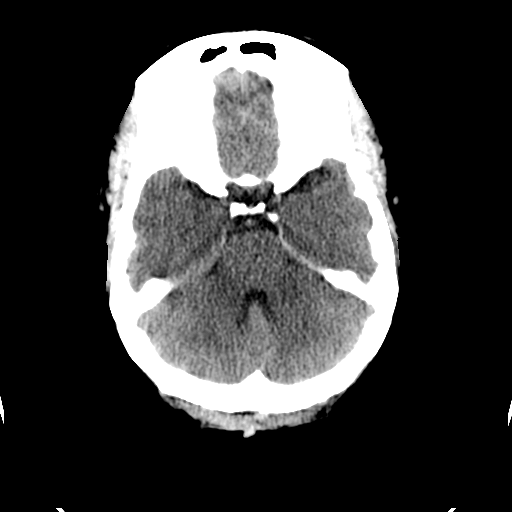
[im 9/32  bone]
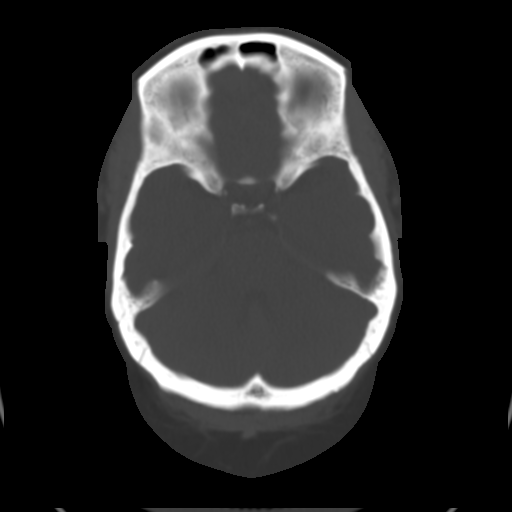
[im 11/32  brain]
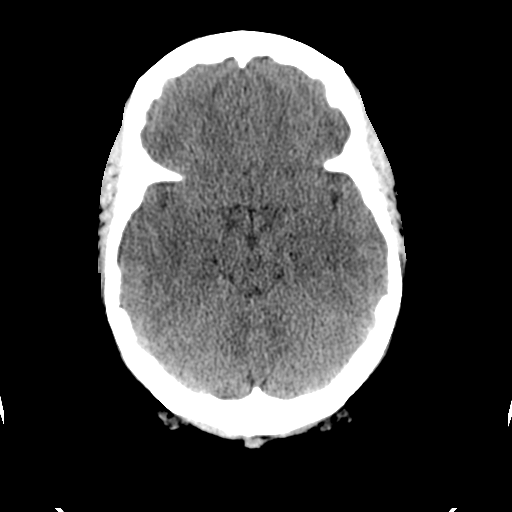
[im 13/32  brain]
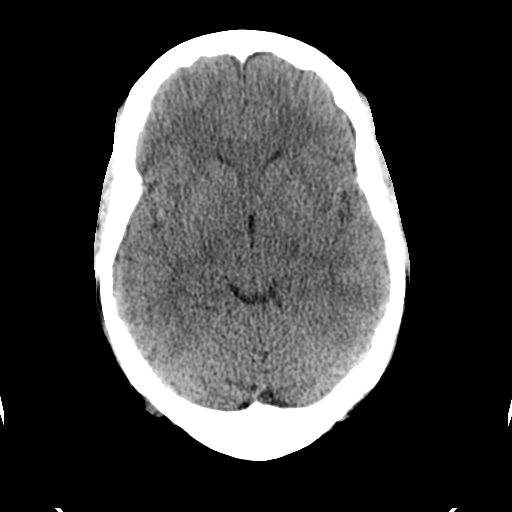
[im 15/32  brain]
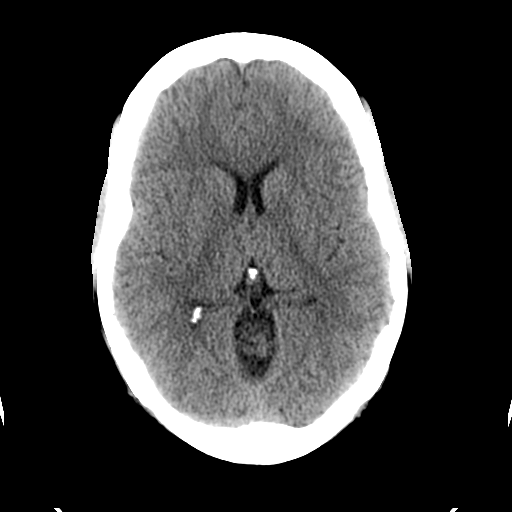
[im 17/32  brain]
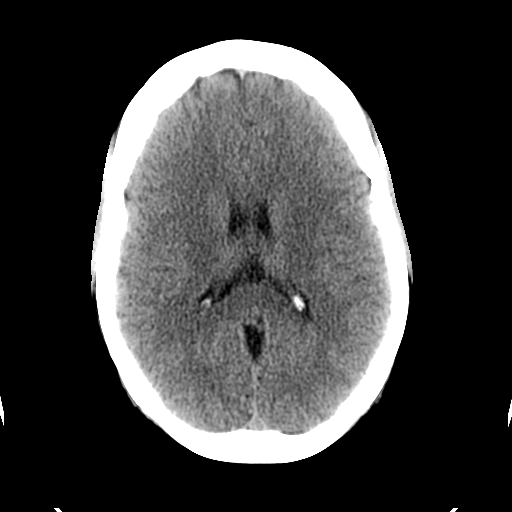
[im 17/32  bone]
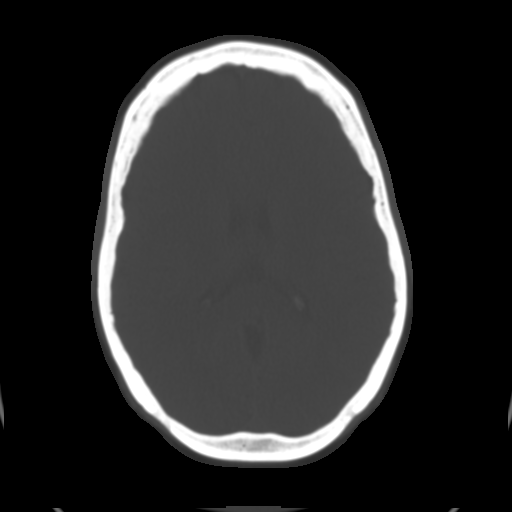
[im 19/32  brain]
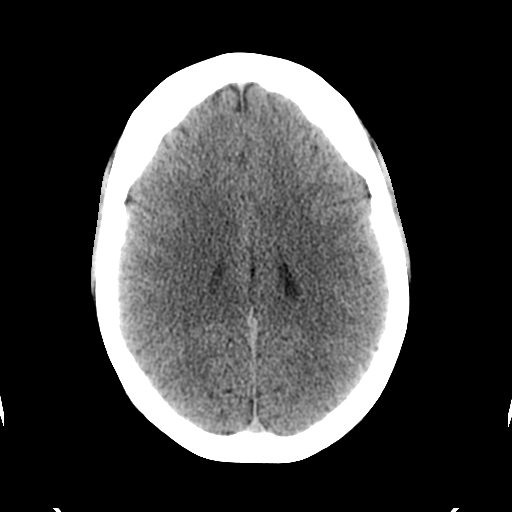
[im 21/32  brain]
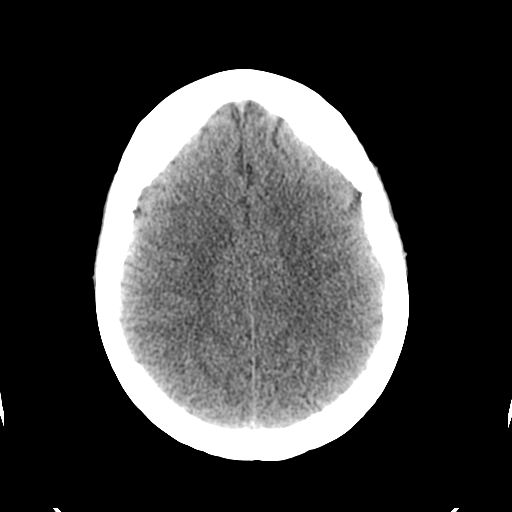
[im 23/32  brain]
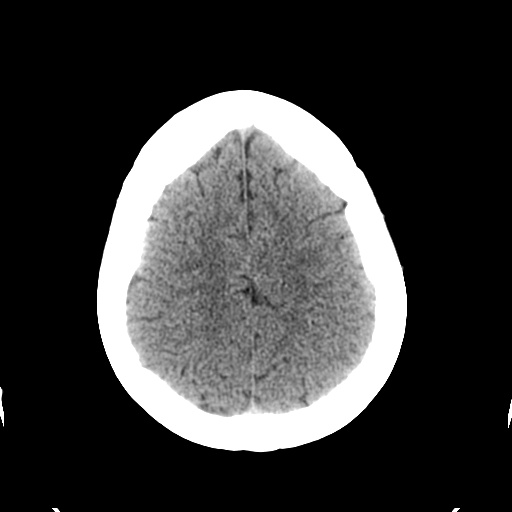
[im 24/32  brain]
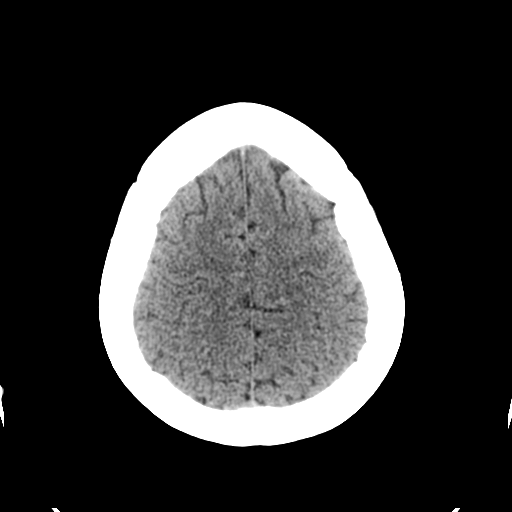
[im 24/32  bone]
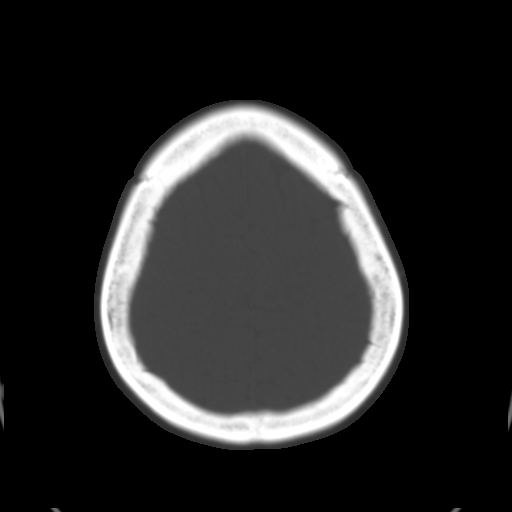
[im 26/32  brain]
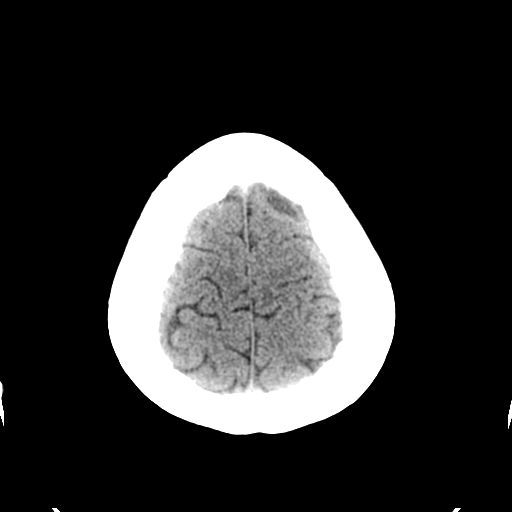
[im 28/32  brain]
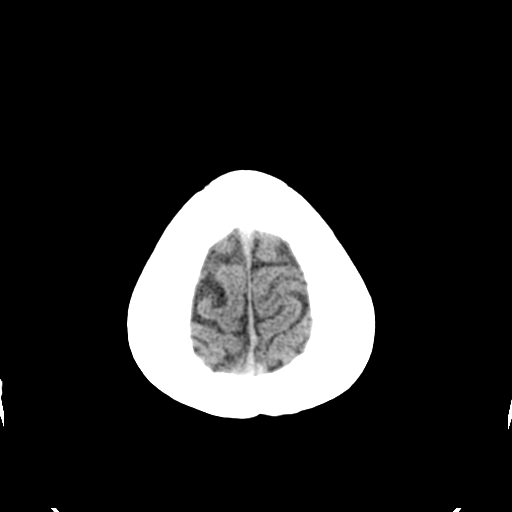
[im 30/32  brain]
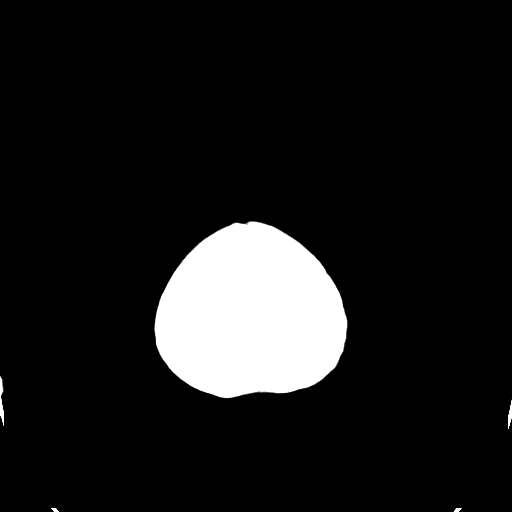

[16 of 30 positions shown; findings below may reference images not displayed]

FINDINGS: There is no evidence for acute infarction, intracranial
hemorrhage, mass lesion, hydrocephalus, or extra-axial fluid.
There is no atrophy or white matter disease.  Calvarium appears
intact. Sinuses and mastoids are clear.  Incidental arachnoid cyst
in the superior vermian cistern is unchanged from priors.
IMPRESSION: Negative.

## 2010-05-04 ENCOUNTER — Emergency Department (HOSPITAL_BASED_OUTPATIENT_CLINIC_OR_DEPARTMENT_OTHER): Admission: EM | Admit: 2010-05-04 | Discharge: 2010-05-04 | Payer: Self-pay | Admitting: Emergency Medicine

## 2010-06-06 ENCOUNTER — Emergency Department (HOSPITAL_COMMUNITY): Admission: EM | Admit: 2010-06-06 | Discharge: 2010-06-06 | Payer: Self-pay | Admitting: Emergency Medicine

## 2010-07-08 ENCOUNTER — Emergency Department (HOSPITAL_COMMUNITY): Admission: EM | Admit: 2010-07-08 | Discharge: 2010-07-08 | Payer: Self-pay | Admitting: Emergency Medicine

## 2010-07-16 ENCOUNTER — Telehealth: Payer: Self-pay | Admitting: Physician Assistant

## 2010-10-19 ENCOUNTER — Encounter: Payer: Self-pay | Admitting: Occupational Therapy

## 2010-10-21 ENCOUNTER — Encounter: Payer: Self-pay | Admitting: Family Medicine

## 2010-10-29 NOTE — Assessment & Plan Note (Signed)
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 in pain?   yes    Location:   headache    Intensity:   5  Vitals Entered By: Dedra Skeens CMA, (January 01, 2007 8:45 AM)                PCP:  Johney Maine  Chief Complaint:  new patient/headache pain.  History of Present Illness: cc: new pt, HA HPI: 1) Ms Battaglia is a new patient.  She has not been to the doctor for a number of years.  Was previously treated for HTN and was on Toprol and HCTZ. Stopped taking about 2 yrs ago b/c she couldn't afford medication.  Has been to ED several times for high BP and bad HA.  States she's never been admitted and is only treated in the ED.  She has a HA today, 5/10, that is not as bad as her BP HA get.  She also says her BP today is not that high for her.  See HTN template for ROS.    2)Spot on arm:  Ms Snowberger states that for a year she's had a soft spot on her right inner arm.  Was not bothering her until recently.  It's now a little painful.  No change in size.    Hypertension History:      She complains of headache, but denies chest pain, palpitations, dyspnea with exertion, orthopnea, peripheral edema, visual symptoms, and neurologic problems.        Positive major cardiovascular risk factors include hypertension.  Negative major cardiovascular risk factors include female age less than 40 years old and non-tobacco-user status.     Current Allergies: NAPROSYN  Past Medical History:    Hypertension    Migraine HA    Bell's Palsy-pt states years ago on left side and still has sequalae(??)  Past Surgical History:    Caesarean section 1980, 1981    Hysterectomy 1995    Arthroscopic knee sx B 1995   Family History:    Hypertension: mom, sister    DM: mother, sister    Colon  cancer-MGM passed away at 47    Mgreat aunt w/ thyroid problems  Social History:    Lives w/ 3 children, youngest 4 mo on 01/01/07.  Oldest 52yo.  Total 5 children-2 biological (28, 26).  Others are adopted.  Separated.    No smoking, EtOH, no drugs.    Runs [employer] w/ [employer].  In classes for Business Administration at [employer].   Risk Factors:  Tobacco use:  never Alcohol use:  no   Review of Systems       The patient complains of peripheral edema.  The patient denies fever, abdominal pain, muscle weakness, and breast masses.         Edema is only occasional and not present today.   Physical Exam  General:     Well-developed,well-nourished,in no acute distress; alert,appropriate and cooperative throughout examination Head:     Normocephalic and atraumatic without obvious abnormalities. No apparent alopecia or balding. Eyes:     No corneal or conjunctival inflammation noted. EOMI. Perrla. Funduscopic exam benign, without hemorrhages, exudates  or papilledema. Vision grossly normal. Mouth:     Oral mucosa and oropharynx without lesions or exudates.  Teeth in good repair. Neck:     No deformities, masses, or tenderness noted. Chest Wall:     No deformities, masses, or tenderness noted. Breasts:     Cystic appearing, thickened breast tissue w/ no mass, nodules,  tenderness, bulging, retraction, inflamation, nipple discharge or skin changes noted.   Lungs:     Normal respiratory effort, chest expands symmetrically. Lungs are clear to auscultation, no crackles or wheezes. Heart:     Normal rate and regular rhythm. S1 and S2 normal without gallop, murmur, click, rub or other extra sounds. Abdomen:     OBESE soft, non-tender, normal bowel sounds, and no masses.   Msk:     normal ROM, no joint tenderness, no joint swelling, no redness over joints, and no joint deformities.   Pulses:     R and L radial,,dorsalis pedis and posterior tibial pulses are full and equal  bilaterally Extremities:     trace left pedal edema and trace right pedal edema. Skin on feet appears "deflated" as if previously distended due to edema.  Dry   Neurologic:     No cranial nerve deficits noted. There is mild left side droop only apparent when pt is asked to smile or puff out cheeks.  Pt states this was secondary to previous Bell's Palsy and is NOT new Skin:     turgor normal  hyperpigmented under breast tissue.   Cervical Nodes:     No lymphadenopathy noted Axillary Nodes:     No palpable lymphadenopathy Psych:     Cognition and judgment appear intact. Alert and cooperative with normal attention span and concentration. No apparent delusions, illusions, hallucinations    Impression & Recommendations:  Problem # 1:  HYPERTENSION (ICD-401.9) Assessment: New Due to pt's severly elevated BP we will start HCTZ 25mg  today and Lisinopril 10 mg daily.  Will titrate Lisinopril up as needed and possibly combine pill.  Using Lisinopril b/c of pt's risk of DM.  May switch to a B blocker as needed.  Pt to return in 2-3 weeks.  Will discuss diet and exercise at next visit.  I wanted to get EKG but pt refuses due to financial reasons.  Will obtain in future once pt is set up w/ Rudell Cobb.  Will be CMP, UA, lipids today. BP today: 201/116  Orders: Basic Met-[facility] ([id number]) UA Glucose/Protein-[facility] (81002) Lipid-[facility] (13086-57846)   Problem # 2:  SYMP SWELL/MASS/LUMP, LOCALIZED SUPERFICIAL (ICD-782.2) Most likely lipoma.  Will continue to watch and remove if needed.    Hypertension Assessment/Plan:      The patient's hypertensive risk group is category A: No risk factors and no target organ damage.  Today's blood pressure is 201/116.     Patient Instructions: 1)  Take one tablet of HCTZ 25mg  daily 2)  Take one tablet of Lisinopril daily.  Wait until Dr Karn Pickler calls you to start.   3)  Please schedule a follow-up appointment in 2-3 weeks.  Laboratory Results   Urine Tests   Date/Time Recieved: January 01, 2007 9:28 AM  Date/Time Reported: January 01, 2007 9:35 AM   Routine Urinalysis   Glucose: negative   (Normal Range: Negative) Protein: negative   (Normal Range: Negative)    Comments: ...................................................................DONNA Sarah [facility]  January 01, 2007 9:35 AM

## 2010-10-29 NOTE — Assessment & Plan Note (Signed)
SummaryBarbaraann Bowman PT/HTN/PAIN AT TOP OF HEAD//[place]   Vital Signs:  Patient profile:   50 year old female Menstrual status:  last cycle 1995 Height:      59 inches Weight:      265 pounds BMI:     53.72 Temp:     98.1 degrees F oral Pulse rate:   86 / minute Pulse rhythm:   regular Resp:     18 per minute BP sitting:   160 / 96  (left arm) Cuff size:   large  Vitals Entered By: Jodi Bowman (Jan 29, 2010 11:39 AM) CC: renew meds... pt says she has been having headache in one spot... pt says this has been going on for a month now....  pt has tried OTC meds... pt says he she feels her head its a sore there.. Is Patient Diabetic? No Pain Assessment Patient in pain? no       Does patient need assistance? Functional Status Self care Ambulation Normal   Primary Care Provider:  Tereso Newcomer, PA-C  CC:  renew meds... pt says she has been having headache in one spot... pt says this has been going on for a month now....  pt has tried OTC meds... pt says he she feels her head its a sore there...  History of Present Illness: First meeting with patient.  Used to see Dr. [name].  Out of BP meds for about a month.  Has been noticing headaches every day for the last 10 days.  Not her usual headache.  No scotoma.  +throbbing.  She does have migraines.  No problems with nausea or photophobia with these headaches.  No changes in vision.  No numbness in face or trouble talking.  Does feel fatigued.  No unilateral weakness or amaurosis fugax symptoms.  Reports recent h/o sinusitis.  She went to ED and was treated with amoxicillin.  Headache got worse after sinus infection.  Still notes some congestion.  Cough is getting better.  No fevers.  Congestion improved.  No dyspnea or sore throats.  No chest pain.  No syncope.  Also notes mass on right upper extremity.  Has been there for years.  But, it is getting bigger and is painful at times.   Problems Prior to Update: 1)  Headache  (ICD-784.0) 2)   Preventive Health Care  (ICD-V70.0) 3)  Lipoma  (ICD-214.9) 4)  Ankle Injury, Right  (ICD-959.7) 5)  Pharyngitis  (ICD-462) 6)  Back Pain  (ICD-724.5) 7)  Screening For Mlig Neop, Breast, Nos  (ICD-V76.10) 8)  Anxiety State Nos  (ICD-300.00) 9)  Low Back Pain  (ICD-724.2) 10)  Allergic Rhinitis  (ICD-477.9) 11)  Hypertension  (ICD-401.9)  Current Medications (verified): 1)  Lisinopril-Hydrochlorothiazide 20-25 Mg  Tabs (Lisinopril-Hydrochlorothiazide) .... Take 1 Tablet By Mouth Every Morning 2)  Ibuprofen 800 Mg Tabs (Ibuprofen) .Marland Kitchen.. 1 Tablet By Mouth Two Times A Day As Needed For Pain  **take With Food** 3)  Amlodipine Besylate 5 Mg Tabs (Amlodipine Besylate) .... One Tablet By Mouth Daily For Blood Pressure  Allergies (verified): 1)  Naprosyn  Past History:  Past Medical History: Last updated: 03/17/2007 Hypertension Migraine HA Bell's Palsy-pt states years ago on left side and still has sequalae Allergic rhinitis Anxiety Low back pain Right lower extremity doppler negative, 07/18/05  Past Surgical History: Last updated: 01/01/2007 Caesarean section 1980, 1981 Hysterectomy 1995 Arthroscopic knee sx B 1995  Physical Exam  General:  alert, well-developed, and well-nourished.   Head:  normocephalic  and atraumatic.   Eyes:  pupils equal, pupils round, and pupils reactive to light.   difficult to visualize fundi bilat Nose:  no external deformity.   Mouth:  pharynx pink and moist.   Neck:  supple.   Lungs:  normal breath sounds, no crackles, and no wheezes.   Heart:  normal rate and regular rhythm.   Abdomen:  soft and non-tender.   Neurologic:  alert & oriented X3, cranial nerves II-XII intact, strength normal in all extremities, heel-to-shin normal, and Romberg negative.   Psych:  normally interactive.     Impression & Recommendations:  Problem # 1:  Preventive Health Care (ICD-V70.0)  had TAH 1995 for fibroids says she had pap in 2009 . . .never been  abnormal past due for mammo will set up CPE and get mammo fasting today . . . 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 Take 1 tablet by mouth every morning    Amlodipine Besylate 5 Mg Tabs (Amlodipine besylate) ..... One tablet by mouth daily for blood pressure  Orders: T-Comprehensive Metabolic Panel ([id number]) T-CBC w/Diff ([id number]) T-TSH ([id number])  Problem # 4:  HEADACHE (ICD-784.0) suspect related to her untreated blood pressure get back on meds patient to let me know if headaches do not resolve  Her updated medication list for this problem includes:    Ibuprofen 800 Mg Tabs (Ibuprofen) .Marland Kitchen... 1 tablet by mouth two times a day as needed for pain  **take with food**  Complete Medication List: 1)  Lisinopril-hydrochlorothiazide 20-25 Mg Tabs (Lisinopril-hydrochlorothiazide) .... Take 1 tablet by mouth every morning 2)  Ibuprofen 800 Mg Tabs (Ibuprofen) .Marland Kitchen.. 1 tablet by mouth two times a day as needed for pain  **take with food** 3)  Amlodipine Besylate 5 Mg Tabs (Amlodipine besylate) .... One tablet by mouth daily for blood pressure  Patient Instructions: 1)  Please schedule a follow-up appointment in 2 weeks with lab for blood pressure check. 2)  Notify me if headaches do not resolve with restarting your blood pressure medicines.  You should feel better in about 1-2 weeks.  3)  Please schedule a follow-up appointment in 1-2 months with Jodi Bowman for CPE.   4)     Prescriptions: AMLODIPINE BESYLATE 5 MG TABS (AMLODIPINE BESYLATE) One tablet by mouth daily for blood pressure  #30 x 6   Entered and Authorized by:   Jodi Newcomer PA-C   Signed by:   Jodi Newcomer PA-C on 01/29/2010   Method used:   Electronically to        [facility]  [address] [phone]* (retail)       [address]       [place], [place]  [address]       Ph: [phone] or [phone]       Fax: [phone]   RxID:   [id number] LISINOPRIL-HYDROCHLOROTHIAZIDE 20-25 MG  TABS (LISINOPRIL-HYDROCHLOROTHIAZIDE) Take 1 tablet by mouth every morning  #90 x 3   Entered and Authorized by:   Jodi Newcomer PA-C   Signed by:   Jodi Newcomer PA-C on 01/29/2010   Method used:  Electronically to        [facility] [address] [phone]* (retail)       [address]       [place], [place]  [address]       Ph: [phone] or [phone]       Fax: [phone]   RxID:   [id number]   Laboratory Results  Date/Time Received:   Other Tests  Rapid HIV: negative

## 2010-10-29 NOTE — Progress Notes (Signed)
Summary: Lab question   Phone Note Call from Patient Call back at [phone]   Summary of Call: The pt suppostly needs to have blood work today to check her liver function, chol and kidney fuction but those labs were not addressed today. She needs to know when she can do it. Biridiana Twardowski Pa-c  Initial call taken by: Manon Hilding,  Jan 29, 2010 3:50 PM  Follow-up for Phone Call        I SPOKE WITH [place]. MS Dungee WAS TO HAVE LABS AT HER VISIT ON 05/03.  SHE IS SCHEDULED TO COME IN ON 05/05. Follow-up by: Leodis Rains,  Jan 30, 2010 12:00 PM

## 2010-10-29 NOTE — Assessment & Plan Note (Signed)
Summary: mini -stroke//gk   Vital Signs:  Patient profile:   50 year old female Menstrual status:  last cycle 1995 Height:      59 inches Weight:      270 pounds BMI:     54.73 Temp:     97.6 degrees F oral Pulse rate:   92 / minute Pulse rhythm:   regular Resp:     18 per minute BP sitting:   177 / 98  (left arm) Cuff size:   large  Vitals Entered By: Armenia Shannon (March 12, 2010 3:41 PM)  CC: pt is here xfu...., Headache  Does patient need assistance? Functional Status Self care Ambulation Normal   Primary Care Provider:  Tereso Newcomer, PA-C  CC:  pt is here xfu.... and Headache.  History of Present Illness: 50 year old female presents for post hospital followup.  She had an episode of left facial numbness and weakness and drooping that occurred about a month ago.  This lasted for several hours and resolved prior to bedtime.  She did have some slurred speech.  The next day the symptoms returned.  She felt that her balance was off and it seemed that her symptoms were worse.  Her family took her to the emergency room.  She is a full work up in the hospital.  Her labs were okay except for a hemoglobin A1c of 6.0.  TSH was normal.  CBC was normal.  Her head CT was negative for intracranial bleed.  MRI and MRA of the head were both normal.  Her carotid Dopplers did demonstrate 40-59% bilateral ICA stenosis.  Her echocardiogram demonstrated mild LVH, EF 55-60% and grade 1 diastolic dysfunction.  The discharge summary indicates that her symptoms had resolved by the time she was discharged.  She was diagnosed with transient ischemic attack.  However, in the office today, the patient notes that her left facial droop and weakness has continued.  She also notes right upper chest and weakness.  This is mild.  There is been no worsening in her symptoms.  Her speech has been normal.  She has had almost daily headaches.  She has a history of migraines.  She describes classic migraines with  antecedent scotoma, unilateral throbbing headache with associated nausea and photophobia.  Problems Prior to Update: 1)  Headache  (ICD-784.0) 2)  Preventive Health Care  (ICD-V70.0) 3)  Lipoma  (ICD-214.9) 4)  Ankle Injury, Right  (ICD-959.7) 5)  Pharyngitis  (ICD-462) 6)  Back Pain  (ICD-724.5) 7)  Screening For Mlig Neop, Breast, Nos  (ICD-V76.10) 8)  Anxiety State Nos  (ICD-300.00) 9)  Low Back Pain  (ICD-724.2) 10)  Allergic Rhinitis  (ICD-477.9) 11)  Hypertension  (ICD-401.9)  Current Medications (verified): 1)  Lisinopril-Hydrochlorothiazide 20-25 Mg  Tabs (Lisinopril-Hydrochlorothiazide) .... Take 1 Tablet By Mouth Every Morning 2)  Ibuprofen 800 Mg Tabs (Ibuprofen) .Marland Kitchen.. 1 Tablet By Mouth Two Times A Day As Needed For Pain  **take With Food** 3)  Amlodipine Besylate 5 Mg Tabs (Amlodipine Besylate) .... 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  MRI/MRA; CT negative  b.  Echo with EF 55-60%   c.  dopplers with 40-59% ICA bilat. - **repeat due 01/2011**  Review of Systems  The patient denies chest pain, syncope, dyspnea on exertion, melena, and hematochezia.    Physical Exam  General:  alert, well-developed, and well-nourished.   Head:  normocephalic and atraumatic.   Neck:  supple.   Lungs:  normal breath sounds.   Heart:  normal rate and regular rhythm.   Neurologic:  Mild right upper extremity weakness noted with strength testing; left upper extremity strength is normal  facial noted on the left; She does not have  involvement of her left forehead; no deviation of her tongue on testing;  alert & oriented X3, cranial nerves II-XII intact, finger-to-nose normal, heel-to-shin normal, and Romberg negative.   Psych:  normally interactive.     Impression & Recommendations:  Problem # 1:  CEREBROVASCULAR ACCIDENT (ICD-434.91) CT neg MRI and MRA of head neg in hosp dopplers with 40-59% ICA stenosis now with residual left facial droop and right arm weakness exam findings not that remarkable with mild right UE weakness and left facial droop she was dx with TIA in hospital but, has undoubtedly had a CVA  spoke with Dr. Anne Hahn at [facility] she likely had a very tiny (small vessel) brain stem stroke due to the fact that she has cross symptoms (left face and right arm) no utility in getting a MRA of the neck or repeat MRI it was missed on MRI b/c it was so small her carotid disease is nonsurgical  at this point, the most important thing is to keep her on ASA and control her BP weakness is mild and do not feel she would benefit from PT   Her updated medication list for this problem includes:    Aspirin 325 Mg Tabs (Aspirin) .Marland Kitchen... Take 1 tablet by mouth once a day  Problem # 2:  HYPERTENSION (ICD-401.9) start metoprolol see below  Her updated medication list for this problem includes:    Lisinopril-hydrochlorothiazide 20-25 Mg Tabs (Lisinopril-hydrochlorothiazide) .Marland Kitchen... Take 1 tablet by mouth every morning    Amlodipine Besylate 5 Mg Tabs (Amlodipine besylate) ..... One tablet by mouth daily for blood pressure    Metoprolol Tartrate 50 Mg Tabs (Metoprolol tartrate) .Marland Kitchen... Take 1/2 by mouth two times a day for one week, then increase to take 1 tablet by mouth two times a day  Problem # 3:  MIGRAINE HEADACHE (ICD-346.90) she has scotoma and symptoms in right frontal region symptoms present for years somewhat worse since her stroke start beta blocker as prophylaxis ECG in hosp was ok with normal  intervals do not think she is a candidate for a triptan at this point will give her percocet to use for the short term advised her of dangers for rebound ha's and she understands  Her updated medication list for this problem includes:    Ibuprofen 800 Mg Tabs (Ibuprofen) .Marland Kitchen... 1 tablet by mouth two times a day as needed for pain  **take with food**    Aspirin 325 Mg Tabs (Aspirin) .Marland Kitchen... Take 1 tablet by mouth once a day    Metoprolol Tartrate 50 Mg Tabs (Metoprolol tartrate) .Marland Kitchen... Take 1/2 by mouth two times a day for one week, then increase to take 1 tablet by mouth two times a day    Percocet 5-325 Mg Tabs (Oxycodone-acetaminophen) .Marland Kitchen... Take 1-2 by mouth once daily only as needed for severe headaches  Problem # 4:  DYSLIPIDEMIA (ICD-272.4) now  on simvastatin goal LDL < 70 check FLP and LFTs in 3 months  Her updated medication list for this problem includes:    Simvastatin 20 Mg Tabs (Simvastatin) .Marland Kitchen... Take 1 tab by mouth at bedtime  Problem # 5:  CAROTID ARTERY STENOSIS, BILATERAL (ICD-433.10) tx cholesterol repeat dopplers in 01/2011  Her updated medication list for this problem includes:    Aspirin 325 Mg Tabs (Aspirin) .Marland Kitchen... Take 1 tablet by mouth once a day  Problem # 6:  GLUCOSE INTOLERANCE (ICD-271.3) hemoglobin a 1C 6.0 in the hospital Discuss referral to dietitian at next visit We'll need to keep an eye on her A1c over time  Complete Medication List: 1)  Lisinopril-hydrochlorothiazide 20-25 Mg Tabs (Lisinopril-hydrochlorothiazide) .... Take 1 tablet by mouth every morning 2)  Ibuprofen 800 Mg Tabs (Ibuprofen) .Marland Kitchen.. 1 tablet by mouth two times a day as needed for pain  **take with food** 3)  Amlodipine Besylate 5 Mg Tabs (Amlodipine besylate) .... One tablet by mouth daily for blood pressure 4)  Aspirin 325 Mg Tabs (Aspirin) .... Take 1 tablet by mouth once a day 5)  Simvastatin 20 Mg Tabs (Simvastatin) .... Take 1 tab by mouth at bedtime 6)  Metoprolol Tartrate 50 Mg  Tabs (Metoprolol tartrate) .... Take 1/2 by mouth two times a day for one week, then increase to take 1 tablet by mouth two times a day 7)  Percocet 5-325 Mg Tabs (Oxycodone-acetaminophen) .... Take 1-2 by mouth once daily only as needed for severe headaches  Patient Instructions: 1)  Make sure you take Aspirin every day. 2)  Start taking Metoprolol for your blood pressure.  You should take 1/2 tab two times a day for a week, then a whole tab two times a day. 3)  Return to the lab for blood pressure check and ECG with the nurse in 2 weeks. 4)  Please schedule a follow-up appointment in 1 month with Khyler Urda for blood pressure. 5)  The metoprolol may prevent headaches from coming on . . . eventually. 6)  Always try Tylenol first. 7)  Take 650 - 1000 mg of tylenol every 4-6 hours as needed for relief of pain or comfort of fever. Avoid taking more than 4000 mg in a 24 hour period( can cause liver damage in higher doses).  8)  If that does not work, try Ibuprofen. 9)  Take 400-600 mg of Ibuprofen (Advil, Motrin) with food every 4-6 hours as needed  for relief of pain or comfort of fever.  10)  If that does not work, you can try Percocet.  Do not take the Percocet with Tylenol because they both contain Tylenol. 11)  Do not take Percocet often as this will only cause your headaches to worsen. Prescriptions: PERCOCET 5-325 MG TABS (OXYCODONE-ACETAMINOPHEN) Take 1-2 by mouth once daily only as needed for severe headaches  #30 x 0   Entered and Authorized by:   Tereso Newcomer PA-C   Signed by:   Tereso Newcomer PA-C on 03/12/2010   Method used:   Print then Give to Patient   RxID:   [id number] METOPROLOL TARTRATE 50 MG TABS (METOPROLOL TARTRATE) Take 1/2 by mouth two times a day for one week, then increase to Take 1 tablet by mouth two times a day  #60 x 5   Entered and Authorized by:   Tereso Newcomer PA-C   Signed by:   Tereso Newcomer PA-C on 03/12/2010   Method used:   Print then Give to Patient  RxID:    [id number]

## 2010-10-29 NOTE — Letter (Signed)
Summary: BASIC NEED'S BAG  BASIC NEED'S BAG   Imported By: Arta Bruce 04/30/2009 11:18:44  _____________________________________________________________________  External Attachment:    Type:   Image     Comment:   External Document

## 2010-10-29 NOTE — Progress Notes (Signed)
Summary: change amlodipine to 90 day supply   Phone Note Call from Patient Call back at [facility] Phone [phone]   Summary of Call: The pt spoke with the [facility] ([place]) and they told her in order to get a discount from her amilodipine she needs to get a refills for 90 pills rather than 30 pills in another words for three months rather than one month. Alben Spittle PA-c Initial call taken by: Manon Hilding,  Jan 30, 2010 8:48 AM  Follow-up for Phone Call        forward to provider Follow-up by: Armenia Shannon,  Jan 31, 2010 8:12 AM  Additional Follow-up for Phone Call Additional follow up Details #1::        Rx in basket to fax to her pharmacy Additional Follow-up by: Brynda Rim,  Jan 31, 2010 2:09 PM    Additional Follow-up for Phone Call Additional follow up Details #2::    fax rx to [facility] on [address] Follow-up by: Armenia Shannon,  Jan 31, 2010 2:49 PM  Prescriptions: AMLODIPINE BESYLATE 5 MG TABS (AMLODIPINE BESYLATE) One tablet by mouth daily for blood pressure  #90 x 3   Entered and Authorized by:   Tereso Newcomer PA-C   Signed by:   Tereso Newcomer PA-C on 01/31/2010   Method used:   Reprint   RxID:   [id number]

## 2010-10-29 NOTE — Letter (Signed)
Summary: *HSN Results Follow up  [facility]  [address] [place], [place] [address]   Phone: [phone]  Fax: [phone]      02/01/2010   BURNETTA KOHLS [address] [place], [place]  [address]   Dear  Ms. Davida Bennetts,                            ____S.Drinkard,FNP   ____D. Gore,FNP       ____B. McPherson,MD   ____V. Rankins,MD    ____E. Mulberry,MD    ____N. Daphine Deutscher, FNP  ____D. Reche Dixon, MD    ____K. Philipp Deputy, MD    __x__S. Alben Spittle, PA-C     This letter is to inform you that your recent test(s):  _______Pap Smear    ___x____Lab Test     _______X-ray    ___x____ is within acceptable limits  _______ requires a medication change  _______ requires a follow-up lab visit  _______ requires a follow-up visit with your provider   Comments:       _________________________________________________________ If you have any questions, please contact our office                     Sincerely,  Tereso Newcomer PA-C [facility]

## 2010-10-29 NOTE — Letter (Addendum)
Summary: TEST ORDER FORM//ULTRASOUND/ APPT DATE %* TIME  TEST ORDER FORM//ULTRASOUND/ APPT DATE %* TIME   Imported By: Arta Bruce 01/31/2010 14:34:00  _____________________________________________________________________  External Attachments:     1. Type:   Image          Comment:   External Document    2. Type:   Image          Comment:   External Document
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
## 2010-10-29 NOTE — Letter (Signed)
Summary: Discharge Summary  Discharge Summary   Imported By: Arta Bruce 03/18/2010 16:16:59  _____________________________________________________________________  External Attachment:    Type:   Image     Comment:   External Document

## 2010-10-29 NOTE — Letter (Signed)
Summary: REFERRAL/ORTHOPEDIC/APPT DATE & TIME  REFERRAL/ORTHOPEDIC/APPT DATE & TIME   Imported By: Arta Bruce 06/13/2009 12:05:52  _____________________________________________________________________  External Attachment:    Type:   Image     Comment:   External Document

## 2010-10-29 NOTE — Assessment & Plan Note (Signed)
Summary: NEEDS ORTHO REF FOR RIGHT ANKLE/////KT   Vital Signs:  Patient profile:   50 year old female Menstrual status:  last cycle 1995 Weight:      264.5 pounds Temp:     98.0 degrees F Pulse rate:   89 / minute Pulse rhythm:   regular Resp:     20 per minute BP sitting:   150 / 96  (left arm) Cuff size:   large  Vitals Entered By: Vesta Mixer CMA (April 23, 2009 11:29 AM) CC: Twisted her right ankle on July 15th.  Went to ED and had an xray they did not see break, but wanted her to see an orthopedic any way Is Patient Diabetic? No Pain Assessment Patient in pain? yes     Location: rt ankle Intensity: 7  Does patient need assistance? Ambulation Normal   Primary Care Provider:  Johney Maine  CC:  Twisted her right ankle on July 15th.  Went to ED and had an xray they did not see break and but wanted her to see an orthopedic any way.  History of Present Illness: CC:  Follow up from ER for right ankle sprain  Pt is a 50 y/o morbidly obese female who presents for an ER follow up from 04/15/09 where she was seen for a rght ankle injury she sustained 3 days prior to evaluation when she says she stepped on an uneven crack and twisted her ankle.  Says "it went out and I kept going straight".  She had neg ankle film in ER, was treated with an ASO, Percocet and Ibuprofen. Was told at ER to get an orthopedic follow-up, but since she was uninsured and came to [facility], she should come here for [place] to get her the appointment. She says the swelling is about "half" better, but the pain is not better.  She prefers not to take the Percocet because it makes her too lethargic.  She is doing just as well with Ibuprofen 800mg  three times a day.  Unable to fully bear weight or walk any distance due to pain.  Not much pain at rest.  Current Medications (verified): 1)  Lisinopril-Hydrochlorothiazide 20-25 Mg  Tabs (Lisinopril-Hydrochlorothiazide) .... Take 1 Tablet By Mouth Every Morning 2)  Ibuprofen  800 Mg Tabs (Ibuprofen) .Marland Kitchen.. 1 Tablet By Mouth Two Times A Day As Needed For Pain  **take With Food** 3)  Amlodipine Besylate 5 Mg Tabs (Amlodipine Besylate) .... One Tablet By Mouth Daily For Blood Pressure  Allergies (verified): 1)  Naprosyn  Physical Exam  General:  Well-developed,well-nourished,in no acute distress; alert,appropriate and cooperative throughout examination Msk:  ambulatory with ASO on right ankle, no assistance wih walking device.  Right anke has mild edema and tenderness over lateral malleolararea.  FROM ankle and 2+ pedal pulses.  No evidence of ecchymosis or abrasion. Extremities:  as documented in MS exam Neurologic:  No cranial nerve deficits noted. Station and gait are normal. Plantar reflexes are down-going bilaterally. DTRs are symmetrical throughout. Sensory, motor and coordinative functions appear intact. Skin:  Intact without suspicious lesions or rashes   Impression & Recommendations:  Problem # 1:  ANKLE INJURY, RIGHT (ICD-959.7)  Continue ankle support, elevation, no full weight bearing and analgesics.   Will make Orthopedic referral with pt understanding she will still need to be responsible tor the cost.  She states understanding  Orders: Orthopedic Referral (Ortho) Orthopedic Referral (Ortho)  Complete Medication List: 1)  Lisinopril-hydrochlorothiazide 20-25 Mg Tabs (Lisinopril-hydrochlorothiazide) .... Take 1 tablet  by mouth every morning 2)  Ibuprofen 800 Mg Tabs (Ibuprofen) .Marland Kitchen.. 1 tablet by mouth two times a day as needed for pain  **take with food** 3)  Amlodipine Besylate 5 Mg Tabs (Amlodipine besylate) .... One tablet by mouth daily for blood pressure

## 2010-10-29 NOTE — Assessment & Plan Note (Signed)
Summary: SORE THROAT/ COUGHING/ GK  Pt had to leave with out being seen.. Had to pick up child from day care............. Tiffany McCoy CMA  July 03, 2009 5:04 PM  Vital Signs:  Patient profile:   50 year old female Menstrual status:  last cycle 1995 Weight:      257.6 pounds Temp:     98.0 degrees F Pulse rate:   97 / minute Pulse rhythm:   regular BP sitting:   137 / 83  (left arm) Cuff size:   large  Vitals Entered By: Vesta Mixer CMA (July 03, 2009 4:09 PM) CC: For past two days has been achy all over, face hurts, nasal congestion, eyes watery, sore throat, chest hurts. Is Patient Diabetic? No Pain Assessment Patient in pain? yes     Location: every where Intensity: 8  Does patient need assistance? Ambulation Normal   CC:  For past two days has been achy all over, face hurts, nasal congestion, eyes watery, sore throat, and chest hurts..  Allergies (verified): 1)  Naprosyn   Complete Medication List: 1)  Lisinopril-hydrochlorothiazide 20-25 Mg Tabs (Lisinopril-hydrochlorothiazide) .... Take 1 tablet by mouth every morning 2)  Ibuprofen 800 Mg Tabs (Ibuprofen) .Marland Kitchen.. 1 tablet by mouth two times a day as needed for pain  **take with food** 3)  Amlodipine Besylate 5 Mg Tabs (Amlodipine besylate) .... One tablet by mouth daily for blood pressure

## 2010-10-29 NOTE — Assessment & Plan Note (Signed)
Summary: FOLLOW UP////BC   Vital Signs:  Patient profile:   50 year old female Menstrual status:  last cycle 1995 Height:      59 inches Weight:      269 pounds BMI:     54.53 Temp:     97.8 degrees F oral Pulse rate:   61 / minute Pulse rhythm:   regular Resp:     18 per minute BP sitting:   136 / 80  (left arm) Cuff size:   large  Vitals Entered By: [place] Shannon (April 02, 2010 9:22 AM)  Serial Vital Signs/Assessments:  Time      Position  BP       Pulse  Resp  Temp     By                     110/60                         Tereso Newcomer PA-C  CC: follow-up visit Is Patient Diabetic? No Pain Assessment Patient in pain? no       Does patient need assistance? Functional Status Self care Ambulation Normal   Primary Care Provider:  Tereso Newcomer, PA-C  CC:  follow-up visit.  History of Present Illness: HTN:  Here for f/u on BP.  Did note some orthostasis after starting metoprolol.  This symptom is getting better.  No syncope.  Lightheadedness is brief.  No near syncope.  No palps.  No chest pain.  No shortness of breath.    CVA;  Left facial droop improving.  Right sided weakness is unchanged.  No functional limitations at home.    Migraines:  Headaches are still daily.  She notes they are less intense.  Taking percocet infrequently.  She has tried amitriptyline in the past without success for prophylaxis.  She states she has tried multiple meds in the past without success.    Current Medications (verified): 1)  Lisinopril-Hydrochlorothiazide 20-25 Mg  Tabs (Lisinopril-Hydrochlorothiazide) .... Take 1 Tablet By Mouth Every Morning 2)  Ibuprofen 800 Mg Tabs (Ibuprofen) .Marland Kitchen.. 1 Tablet By Mouth Two Times A Day As Needed For Pain  **take With Food** 3)  Amlodipine Besylate 5 Mg Tabs (Amlodipine Besylate) .... One Tablet By Mouth Daily For Blood Pressure 4)  Aspirin 325 Mg Tabs (Aspirin) .... Take 1 Tablet By Mouth Once A Day 5)  Simvastatin 20 Mg Tabs (Simvastatin) ....  Take 1 Tab By Mouth At Bedtime 6)  Metoprolol Tartrate 50 Mg Tabs (Metoprolol Tartrate) .... Take 1/2 By Mouth Two Times A Day For One Week, Then Increase To Take 1 Tablet By Mouth Two Times A Day 7)  Percocet 5-325 Mg Tabs (Oxycodone-Acetaminophen) .... Take 1-2 By Mouth Once Daily Only As Needed For Severe Headaches  Allergies (verified): 1)  Naprosyn  Physical Exam  General:  alert, well-developed, and well-nourished.   Head:  normocephalic and atraumatic.   Neck:  supple.   Lungs:  normal breath sounds.   Heart:  normal rate and regular rhythm.   Extremities:  no edema  Neurologic:  alert & oriented X3 and cranial nerves II-XII intact.   Psych:  normally interactive.     Impression & Recommendations:  Problem # 1:  HYPERTENSION (ICD-401.9) Assessment Improved HR 60 on ausc by me symptoms of orthostasis getting better no change in meds  Her updated medication list for this problem includes:    Lisinopril-hydrochlorothiazide 20-25 Mg  Tabs (Lisinopril-hydrochlorothiazide) .Marland Kitchen... Take 1 tablet by mouth every morning    Amlodipine Besylate 5 Mg Tabs (Amlodipine besylate) ..... One tablet by mouth daily for blood pressure    Metoprolol Tartrate 50 Mg Tabs (Metoprolol tartrate) .Marland Kitchen... Take 1/2 by mouth two times a day for one week, then increase to take 1 tablet by mouth two times a day  Problem # 2:  MIGRAINE HEADACHE (ICD-346.90) Assessment: Unchanged will give her more time to see if beta blocker is going to help with prophylaxis  Her updated medication list for this problem includes:    Ibuprofen 800 Mg Tabs (Ibuprofen) .Marland Kitchen... 1 tablet by mouth two times a day as needed for pain  **take with food**    Aspirin 325 Mg Tabs (Aspirin) .Marland Kitchen... Take 1 tablet by mouth once a day    Metoprolol Tartrate 50 Mg Tabs (Metoprolol tartrate) .Marland Kitchen... Take 1/2 by mouth two times a day for one week, then increase to take 1 tablet by mouth two times a day    Percocet 5-325 Mg Tabs  (Oxycodone-acetaminophen) .Marland Kitchen... Take 1-2 by mouth once daily only as needed for severe headaches  Problem # 3:  GLUCOSE INTOLERANCE (ICD-271.3) refer to [facility] for diet ed.  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 Take 1 tablet by mouth once a day  Problem # 5:  CEREBROVASCULAR ACCIDENT (ICD-434.91) stable continue ASA no need for PT at this time  Her updated medication list for this problem includes:    Aspirin 325 Mg Tabs (Aspirin) .Marland Kitchen... Take 1 tablet by mouth once a day  Problem # 6:  DYSLIPIDEMIA (ICD-272.4) repeat FLP at CPE in 2 mos.  Her updated medication list for this problem includes:    Simvastatin 20 Mg Tabs (Simvastatin) .Marland Kitchen... Take 1 tab by mouth at bedtime  Problem # 7:  PREVENTIVE HEALTH CARE (ICD-V70.0) needs CPE . . . will schedule had mammo is s/p TAH . Marland Kitchen . no need for Pap at CPE discuss colo at CPE  Complete Medication List: 1)  Lisinopril-hydrochlorothiazide 20-25 Mg Tabs (Lisinopril-hydrochlorothiazide) .... Take 1 tablet by mouth every morning 2)  Ibuprofen 800 Mg Tabs (Ibuprofen) .Marland Kitchen.. 1 tablet by mouth two times a day as needed for pain  **take with food** 3)  Amlodipine Besylate 5 Mg Tabs (Amlodipine besylate) .... One tablet by mouth daily for blood pressure 4)  Aspirin 325 Mg Tabs (Aspirin) .... Take 1 tablet by mouth once a day 5)  Simvastatin 20 Mg Tabs (Simvastatin) .... Take 1 tab by mouth at bedtime 6)  Metoprolol Tartrate 50 Mg Tabs (Metoprolol tartrate) .... Take 1/2 by mouth two times a day for one week, then increase to take 1 tablet by mouth two times a day 7)  Percocet 5-325 Mg Tabs (Oxycodone-acetaminophen) .... Take 1-2 by mouth once daily only as needed for severe headaches  Patient Instructions: 1)  Cancel f/u visit on July 25. 2)  Schedule follow up with [facility] in 2 months for CPE.  Come fasting for  blood work.  Nothing to eat or drink after midnight except water.  3)  Return sooner if needed. 4)  Continue taking Aspirin 325 mg once daily every day. 5)  Schedule appointment with Druc[facility] Schmidt for diet education for glucose intolerance. Prescriptions: SIMVASTATIN 20 MG TABS (SIMVASTATIN) Take 1 tab by mouth at bedtime  #30 x 5  Entered and Authorized by:   Tereso Newcomer PA-C   Signed by:   Tereso Newcomer PA-C on 04/02/2010   Method used:   Electronically to        [facility]  [address] [phone]* (retail)       [address]       [place], [place]  [address]       Ph: [phone] or [phone]       Fax: [phone]   RxID:   [id number]

## 2010-10-29 NOTE — Progress Notes (Signed)
Summary: Needs office visit  Phone Note Outgoing Call   Summary of Call: Patient went to ED recently with chest pain. She needs to schedule follow up in office to review her symptoms.  Initial call taken by: Brynda Rim,  July 16, 2010 12:02 PM  Follow-up for Phone Call        pt says her orange card is expired and she has appt already to get eligable bit its a month out... Follow-up by: Arm[place] Shannon,  July 16, 2010 12:07 PM  Additional Follow-up for Phone Call Additional follow up Details #1::        return to ed if she has more chest pain sched appt as soon as she is able  Additional Follow-up by: Brynda Rim,  July 16, 2010 12:14 PM    Additional Follow-up for Phone Call Additional follow up Details #2::    pt is aware Follow-up by: Arm[place] Shannon,  July 16, 2010 12:15 PM

## 2010-10-29 NOTE — Miscellaneous (Signed)
  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

## 2010-10-29 NOTE — Assessment & Plan Note (Signed)
Summary: Acute - Pharyngitis   Vital Signs:  Patient profile:   50 year old female Menstrual status:  last cycle 1995 Height:      59 inches Weight:      260.5 pounds BMI:     52.80 Temp:     97.8 degrees F oral Pulse rate:   68 / minute Pulse rhythm:   regular Resp:     18 per minute BP sitting:   140 / 98  (left arm) Cuff size:   large  Vitals Entered By: [place] Shannon (March 21, 2009 2:43 PM)  Primary Care Provider:  Johney Maine  CC:  pt says she has been coughing at night with a runny nose...Marland KitchenMarland Kitchen pt complains about her throat hurting...Marland KitchenMarland Kitchen pt says her symptoms has been going on for three days now...Marland KitchenMarland KitchenMarland Kitchenpt has tried tylenol and fomax but nothing helped.....Marland Kitchen  History of Present Illness:  Pt into the office with sore throat started 3 days ago -fever -ear pain +cough (non-productive) -headache  Reports that she was helping her mother move over the weekend and was exposed to some moldy carpet.  Anticoagulation Management History:      Negative risk factors for bleeding include an age less than 7 years old and no history of CVA/TIA.  The bleeding index is 'low risk'.  Positive CHADS2 values include History of HTN.  Negative CHADS2 values include Age > 49 years old and Prior Stroke/CVA/TIA.    Hypertension History:      She denies headache, chest pain, and palpitations.  she has yet to get the amlodipine as ordered.  she continues to take the lisinopril hctz daily.        Positive major cardiovascular risk factors include hypertension.  Negative major cardiovascular risk factors include female age less than 41 years old and non-tobacco-user status.        Further assessment for target organ damage reveals no history of ASHD, cardiac end-organ damage (CHF/LVH), stroke/TIA, peripheral vascular disease, renal insufficiency, or hypertensive retinopathy.      Current Medications (verified): 1)  Lisinopril-Hydrochlorothiazide 20-25 Mg  Tabs (Lisinopril-Hydrochlorothiazide) ....  Take 1 Tablet By Mouth Every Morning 2)  Ibuprofen 800 Mg Tabs (Ibuprofen) .Marland Kitchen.. 1 Tablet By Mouth Two Times A Day As Needed For Pain  **take With Food** 3)  Amlodipine Besylate 5 Mg Tabs (Amlodipine Besylate) .... One Tablet By Mouth Daily For Blood Pressure  Allergies (verified): 1)  Naprosyn  Review of Systems General:  Denies fever. ENT:  Complains of sore throat; denies earache and nasal congestion. CV:  Denies chest pain or discomfort. Resp:  Denies cough. GI:  Denies abdominal pain, nausea, and vomiting.  Physical Exam  General:  alert.  obese Head:  normocephalic.   Eyes:  pupils round.   Ears:  bil ears with clear fluid  no erythema Mouth:  fair dentition.  no tonsilar enlargment Lungs:  normal breath sounds.   Heart:  normal rate and regular rhythm.   Abdomen:  normal bowel sounds.   Msk:  up to exam table no limits   Impression & Recommendations:  Problem # 1:  PHARYNGITIS (ICD-462)  rapid strep negative will give claritin 10mg  by mouth daily x 5 days (samples given) aleve 220mg  x 2 tablets given in office Her updated medication list for this problem includes:    Ibuprofen 800 Mg Tabs (Ibuprofen) .Marland Kitchen... 1 tablet by mouth two times a day as needed for pain  **take with food**  Orders: Rapid Strep ([address])  Problem #  2:  HYPERTENSION (ICD-401.9) advised pt to take BP meds as ordered Her updated medication list for this problem includes:    Lisinopril-hydrochlorothiazide 20-25 Mg Tabs (Lisinopril-hydrochlorothiazide) .Marland Kitchen... Take 1 tablet by mouth every morning    Amlodipine Besylate 5 Mg Tabs (Amlodipine besylate) ..... One tablet by mouth daily for blood pressure  Complete Medication List: 1)  Lisinopril-hydrochlorothiazide 20-25 Mg Tabs (Lisinopril-hydrochlorothiazide) .... Take 1 tablet by mouth every morning 2)  Ibuprofen 800 Mg Tabs (Ibuprofen) .Marland Kitchen.. 1 tablet by mouth two times a day as needed for pain  **take with food** 3)  Amlodipine Besylate 5 Mg Tabs  (Amlodipine besylate) .... One tablet by mouth daily for blood pressure  Hypertension Assessment/Plan:      The patient's hypertensive risk group is category A: No risk factors and no target organ damage.  Her calculated 10 year risk of coronary heart disease is 5 %.  Today's blood pressure is 140/98.  Her blood pressure goal is < 140/90.  Patient Instructions: 1)  Rapid strep is negative for infection. 2)  Take claritin 10mg  by mouth daily x 5 days. 3)  May take ibuprofen as needed for sore throat 4)  gargle with warm salt water two times a day 5)  Get new blood pressure medication as ordered and then take both daily. 6)  Follow up as needed  Laboratory Results  Date/Time Received: March 21, 2009 3:06 PM   Other Tests  Rapid Strep: negative

## 2010-10-29 NOTE — Progress Notes (Signed)
Summary: TALK TO NURSE Barbaraann Barthel PT)  Phone Note Call from Patient   Caller: Patient Reason for Call: Talk to Nurse Summary of Call: PT NEED TO TALK TO DR Barbaraann Barthel NURSE ABOUT MEDS . Fuller Song HER AT [phone]  [facility] YOU  Initial call taken by: Cheryll Dessert,  May 17, 2008 12:41 PM  Follow-up for Phone Call        Left message on machine for pt to return call to the office Follow-up by: Levon Hedger,  May 17, 2008 2:09 PM  Additional Follow-up for Phone Call Additional follow up Details #1::        Spoke with pt she states that Dr. Barbaraann Barthel prescribed Lisinopril/HCTZ on 05/10/08.  She started taking medication on 05/11/08 and states every morning after eating breakfast she takes her medication and she  has been having nausea and vomiting with a headache that is lasting all day.  I told her that I would get this to her provider. Additional Follow-up by: Levon Hedger,  May 19, 2008 11:01 AM    Additional Follow-up for Phone Call Additional follow up Details #2::    This should not be from her BP med. Follow-up by: Beverley Fiedler MD,  May 19, 2008 1:11 PM  Additional Follow-up for Phone Call Additional follow up Details #3:: Details for Additional Follow-up Action Taken: advised pt that provider did not think that was coming from her medication. pt states she was still having same symptoms. did advised pt that we could schedule her an appt to f/u with Dr Barbaraann Barthel for the problem. Additional Follow-up by: Mikey College CMA,  May 24, 2008 12:14 PM

## 2010-10-29 NOTE — Letter (Signed)
Summary: *HSN Results Follow up  [facility]  [address] [place], [place] [address]   Phone: [phone]  Fax: [phone]      02/15/2010   Jodi Bowman [address] [place], [place]  [address]   Dear  [name],                            ____S.Drinkard,FNP   ____D. Gore,FNP       ____B. McPherson,MD   ____V. Rankins,MD    ____E. Mulberry,MD    ____N. Daphine Deutscher, FNP  ____D. Reche Dixon, MD    ____K. Philipp Deputy, MD    __x__S. 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  This is a benign (not harmful) overgrowth of fat cells.  If you decide you want to have it removed, let me know and we can set you up with someone.       _________________________________________________________ If you have any questions, please contact our office                     Sincerely,  Tereso Newcomer PA-C [facility]

## 2010-12-09 ENCOUNTER — Emergency Department (HOSPITAL_COMMUNITY)
Admission: EM | Admit: 2010-12-09 | Discharge: 2010-12-09 | Disposition: A | Discharge status: Home / Self Care | Payer: Self-pay | Attending: Emergency Medicine | Admitting: Emergency Medicine

## 2010-12-09 DIAGNOSIS — I10 Essential (primary) hypertension: Secondary | ICD-10-CM | POA: Insufficient documentation

## 2010-12-09 DIAGNOSIS — M25519 Pain in unspecified shoulder: Secondary | ICD-10-CM | POA: Insufficient documentation

## 2010-12-09 DIAGNOSIS — Z8673 Personal history of transient ischemic attack (TIA), and cerebral infarction without residual deficits: Secondary | ICD-10-CM | POA: Insufficient documentation

## 2010-12-12 LAB — CBC
HCT: 39.6 % (ref 36.0–46.0)
Hemoglobin: 13.4 g/dL (ref 12.0–15.0)
MCH: 29.9 pg (ref 26.0–34.0)
MCHC: 33.9 g/dL (ref 30.0–36.0)
MCV: 88.1 fL (ref 78.0–100.0)
Platelets: 288 10*3/uL (ref 150–400)
RBC: 4.49 MIL/uL (ref 3.87–5.11)
RDW: 14.9 % (ref 11.5–15.5)
WBC: 10.4 10*3/uL (ref 4.0–10.5)

## 2010-12-12 LAB — DIFFERENTIAL
Basophils Absolute: 0 10*3/uL (ref 0.0–0.1)
Basophils Relative: 1 % (ref 0–1)
Eosinophils Absolute: 0.1 10*3/uL (ref 0.0–0.7)
Eosinophils Relative: 1 % (ref 0–5)
Lymphocytes Relative: 38 % (ref 12–46)
Lymphs Abs: 3.9 10*3/uL (ref 0.7–4.0)
Monocytes Absolute: 1 10*3/uL (ref 0.1–1.0)
Monocytes Relative: 10 % (ref 3–12)
Neutro Abs: 5.3 10*3/uL (ref 1.7–7.7)
Neutrophils Relative %: 51 % (ref 43–77)

## 2010-12-12 LAB — TROPONIN I
Troponin I: 0.03 ng/mL (ref 0.00–0.06)
Troponin I: 0.03 ng/mL (ref 0.00–0.06)

## 2010-12-12 LAB — BASIC METABOLIC PANEL
BUN: 12 mg/dL (ref 6–23)
CO2: 28 mEq/L (ref 19–32)
Calcium: 8.9 mg/dL (ref 8.4–10.5)
Chloride: 105 mEq/L (ref 96–112)
Creatinine, Ser: 0.96 mg/dL (ref 0.4–1.2)
GFR calc Af Amer: >60 mL/min (ref >60)
GFR calc non Af Amer: >60 mL/min (ref >60)
Glucose, Bld: 108 mg/dL — ABNORMAL HIGH (ref 70–99)
Potassium: 3.2 mEq/L — ABNORMAL LOW (ref 3.5–5.1)
Sodium: 139 mEq/L (ref 135–145)

## 2010-12-12 LAB — CK TOTAL AND CKMB (NOT AT ARMC)
CK, MB: 2.7 ng/mL (ref 0.3–4.0)
Relative Index: 1.4 (ref 0.0–2.5)
Total CK: 198 U/L — ABNORMAL HIGH (ref 7–177)

## 2010-12-12 LAB — URINALYSIS, ROUTINE W REFLEX MICROSCOPIC
Bilirubin Urine: NEGATIVE
Glucose, UA: NEGATIVE mg/dL
Hgb urine dipstick: NEGATIVE
Ketones, ur: NEGATIVE mg/dL
Nitrite: NEGATIVE
Protein, ur: NEGATIVE mg/dL
Specific Gravity, Urine: 1.019 (ref 1.005–1.030)
Urobilinogen, UA: 0.2 mg/dL (ref 0.0–1.0)
pH: 5.5 (ref 5.0–8.0)

## 2010-12-12 LAB — D-DIMER, QUANTITATIVE: D-Dimer, Quant: 0.39 ug/mL-FEU (ref 0.00–0.48)

## 2010-12-15 LAB — URINALYSIS, ROUTINE W REFLEX MICROSCOPIC
Bilirubin Urine: NEGATIVE
Glucose, UA: NEGATIVE mg/dL
Hgb urine dipstick: NEGATIVE
Ketones, ur: NEGATIVE mg/dL
Nitrite: NEGATIVE
Protein, ur: NEGATIVE mg/dL
Specific Gravity, Urine: 1.005 (ref 1.005–1.030)
Urobilinogen, UA: 0.2 mg/dL (ref 0.0–1.0)
pH: 6.5 (ref 5.0–8.0)

## 2010-12-15 LAB — CBC
HCT: 38.8 % (ref 36.0–46.0)
Hemoglobin: 13.8 g/dL (ref 12.0–15.0)
MCH: 30.5 pg (ref 26.0–34.0)
MCHC: 35.5 g/dL (ref 30.0–36.0)
MCV: 86 fL (ref 78.0–100.0)
Platelets: 245 10*3/uL (ref 150–400)
RBC: 4.51 MIL/uL (ref 3.87–5.11)
RDW: 14.3 % (ref 11.5–15.5)
WBC: 11.3 10*3/uL — ABNORMAL HIGH (ref 4.0–10.5)

## 2010-12-15 LAB — COMPREHENSIVE METABOLIC PANEL
ALT: 20 U/L (ref 0–35)
AST: 16 U/L (ref 0–37)
Albumin: 3.4 g/dL — ABNORMAL LOW (ref 3.5–5.2)
Alkaline Phosphatase: 63 U/L (ref 39–117)
BUN: 11 mg/dL (ref 6–23)
CO2: 29 mEq/L (ref 19–32)
Calcium: 8.9 mg/dL (ref 8.4–10.5)
Chloride: 100 mEq/L (ref 96–112)
Creatinine, Ser: 0.88 mg/dL (ref 0.4–1.2)
GFR calc Af Amer: >60 mL/min (ref >60)
GFR calc non Af Amer: >60 mL/min (ref >60)
Glucose, Bld: 96 mg/dL (ref 70–99)
Potassium: 3.8 mEq/L (ref 3.5–5.1)
Sodium: 134 mEq/L — ABNORMAL LOW (ref 135–145)
Total Bilirubin: 0.1 mg/dL — ABNORMAL LOW (ref 0.3–1.2)
Total Protein: 6.7 g/dL (ref 6.0–8.3)

## 2010-12-15 LAB — DIFFERENTIAL
Basophils Absolute: 0.1 10*3/uL (ref 0.0–0.1)
Basophils Relative: 1 % (ref 0–1)
Eosinophils Absolute: 0.1 10*3/uL (ref 0.0–0.7)
Eosinophils Relative: 1 % (ref 0–5)
Lymphocytes Relative: 36 % (ref 12–46)
Lymphs Abs: 4.1 10*3/uL — ABNORMAL HIGH (ref 0.7–4.0)
Monocytes Absolute: 0.9 10*3/uL (ref 0.1–1.0)
Monocytes Relative: 8 % (ref 3–12)
Neutro Abs: 6.1 10*3/uL (ref 1.7–7.7)
Neutrophils Relative %: 55 % (ref 43–77)

## 2010-12-15 LAB — POCT CARDIAC MARKERS
CKMB, poc: 1.4 ng/mL (ref 1.0–8.0)
Myoglobin, poc: 87.5 ng/mL (ref 12–200)
Troponin i, poc: <0.05 ng/mL (ref 0.00–0.09)

## 2010-12-15 LAB — LIPASE, BLOOD: Lipase: 38 U/L (ref 11–59)

## 2010-12-16 ENCOUNTER — Telehealth (INDEPENDENT_AMBULATORY_CARE_PROVIDER_SITE_OTHER): Payer: Self-pay | Admitting: Internal Medicine

## 2010-12-16 LAB — BASIC METABOLIC PANEL
BUN: 10 mg/dL (ref 6–23)
BUN: 10 mg/dL (ref 6–23)
CO2: 29 mEq/L (ref 19–32)
CO2: 31 mEq/L (ref 19–32)
Calcium: 8.9 mg/dL (ref 8.4–10.5)
Calcium: 9.1 mg/dL (ref 8.4–10.5)
Chloride: 100 mEq/L (ref 96–112)
Chloride: 101 mEq/L (ref 96–112)
Creatinine, Ser: 0.84 mg/dL (ref 0.4–1.2)
Creatinine, Ser: 0.92 mg/dL (ref 0.4–1.2)
GFR calc Af Amer: >60 mL/min (ref >60)
GFR calc Af Amer: >60 mL/min (ref >60)
GFR calc non Af Amer: >60 mL/min (ref >60)
GFR calc non Af Amer: >60 mL/min (ref >60)
Glucose, Bld: 102 mg/dL — ABNORMAL HIGH (ref 70–99)
Glucose, Bld: 110 mg/dL — ABNORMAL HIGH (ref 70–99)
Potassium: 3.7 mEq/L (ref 3.5–5.1)
Potassium: 3.7 mEq/L (ref 3.5–5.1)
Sodium: 137 mEq/L (ref 135–145)
Sodium: 137 mEq/L (ref 135–145)

## 2010-12-16 LAB — RAPID URINE DRUG SCREEN, HOSP PERFORMED
Amphetamines: NOT DETECTED
Barbiturates: NOT DETECTED
Benzodiazepines: NOT DETECTED
Cocaine: NOT DETECTED
Opiates: POSITIVE — AB
Tetrahydrocannabinol: NOT DETECTED

## 2010-12-16 LAB — COMPREHENSIVE METABOLIC PANEL
ALT: 21 U/L (ref 0–35)
AST: 18 U/L (ref 0–37)
Albumin: 3.4 g/dL — ABNORMAL LOW (ref 3.5–5.2)
Alkaline Phosphatase: 75 U/L (ref 39–117)
BUN: 10 mg/dL (ref 6–23)
CO2: 28 mEq/L (ref 19–32)
Calcium: 9 mg/dL (ref 8.4–10.5)
Chloride: 99 mEq/L (ref 96–112)
Creatinine, Ser: 0.87 mg/dL (ref 0.4–1.2)
GFR calc Af Amer: >60 mL/min (ref >60)
GFR calc non Af Amer: >60 mL/min (ref >60)
Glucose, Bld: 107 mg/dL — ABNORMAL HIGH (ref 70–99)
Potassium: 3.6 mEq/L (ref 3.5–5.1)
Sodium: 136 mEq/L (ref 135–145)
Total Bilirubin: 0.8 mg/dL (ref 0.3–1.2)
Total Protein: 7 g/dL (ref 6.0–8.3)

## 2010-12-16 LAB — URINALYSIS, ROUTINE W REFLEX MICROSCOPIC
Bilirubin Urine: NEGATIVE
Glucose, UA: NEGATIVE mg/dL
Hgb urine dipstick: NEGATIVE
Ketones, ur: NEGATIVE mg/dL
Nitrite: NEGATIVE
Protein, ur: NEGATIVE mg/dL
Specific Gravity, Urine: 1.011 (ref 1.005–1.030)
Urobilinogen, UA: 1 mg/dL (ref 0.0–1.0)
pH: 5 (ref 5.0–8.0)

## 2010-12-16 LAB — PROTIME-INR
INR: 1.03 (ref 0.00–1.49)
Prothrombin Time: 13.4 seconds (ref 11.6–15.2)

## 2010-12-16 LAB — CARDIAC PANEL(CRET KIN+CKTOT+MB+TROPI)
CK, MB: 1.4 ng/mL (ref 0.3–4.0)
CK, MB: 1.4 ng/mL (ref 0.3–4.0)
CK, MB: 1.8 ng/mL (ref 0.3–4.0)
Relative Index: 0.8 (ref 0.0–2.5)
Relative Index: 0.9 (ref 0.0–2.5)
Relative Index: 1.2 (ref 0.0–2.5)
Total CK: 151 U/L (ref 7–177)
Total CK: 159 U/L (ref 7–177)
Total CK: 183 U/L — ABNORMAL HIGH (ref 7–177)
Troponin I: 0.03 ng/mL (ref 0.00–0.06)
Troponin I: 0.03 ng/mL (ref 0.00–0.06)
Troponin I: 0.03 ng/mL (ref 0.00–0.06)

## 2010-12-16 LAB — DIFFERENTIAL
Basophils Absolute: 0 10*3/uL (ref 0.0–0.1)
Basophils Relative: 1 % (ref 0–1)
Eosinophils Absolute: 0.1 10*3/uL (ref 0.0–0.7)
Eosinophils Relative: 1 % (ref 0–5)
Lymphocytes Relative: 35 % (ref 12–46)
Lymphs Abs: 3.6 10*3/uL (ref 0.7–4.0)
Monocytes Absolute: 0.9 10*3/uL (ref 0.1–1.0)
Monocytes Relative: 9 % (ref 3–12)
Neutro Abs: 5.6 10*3/uL (ref 1.7–7.7)
Neutrophils Relative %: 55 % (ref 43–77)

## 2010-12-16 LAB — TSH: TSH: 2.399 u[IU]/mL (ref 0.350–4.500)

## 2010-12-16 LAB — CBC
HCT: 39.6 % (ref 36.0–46.0)
HCT: 42.7 % (ref 36.0–46.0)
Hemoglobin: 13.3 g/dL (ref 12.0–15.0)
Hemoglobin: 14.3 g/dL (ref 12.0–15.0)
MCHC: 33.5 g/dL (ref 30.0–36.0)
MCHC: 33.6 g/dL (ref 30.0–36.0)
MCV: 86.3 fL (ref 78.0–100.0)
MCV: 86.8 fL (ref 78.0–100.0)
Platelets: 248 10*3/uL (ref 150–400)
Platelets: 301 10*3/uL (ref 150–400)
RBC: 4.56 MIL/uL (ref 3.87–5.11)
RBC: 4.95 MIL/uL (ref 3.87–5.11)
RDW: 15.4 % (ref 11.5–15.5)
RDW: 15.6 % — ABNORMAL HIGH (ref 11.5–15.5)
WBC: 10.4 10*3/uL (ref 4.0–10.5)
WBC: 12.9 10*3/uL — ABNORMAL HIGH (ref 4.0–10.5)

## 2010-12-16 LAB — HEPATIC FUNCTION PANEL
ALT: 19 U/L (ref 0–35)
AST: 17 U/L (ref 0–37)
Albumin: 3.2 g/dL — ABNORMAL LOW (ref 3.5–5.2)
Alkaline Phosphatase: 72 U/L (ref 39–117)
Bilirubin, Direct: 0.1 mg/dL (ref 0.0–0.3)
Indirect Bilirubin: 0.6 mg/dL (ref 0.3–0.9)
Total Bilirubin: 0.7 mg/dL (ref 0.3–1.2)
Total Protein: 6.7 g/dL (ref 6.0–8.3)

## 2010-12-16 LAB — HOMOCYSTEINE: Homocysteine: 8.9 umol/L (ref 4.0–15.4)

## 2010-12-16 LAB — HEMOGLOBIN A1C
Hgb A1c MFr Bld: 6 % — ABNORMAL HIGH (ref <5.7)
Mean Plasma Glucose: 126 mg/dL — ABNORMAL HIGH (ref <117)

## 2010-12-16 LAB — LIPID PANEL
Cholesterol: 163 mg/dL (ref 0–200)
HDL: 46 mg/dL (ref >39)
LDL Cholesterol: 99 mg/dL (ref 0–99)
Total CHOL/HDL Ratio: 3.5 RATIO
Triglycerides: 89 mg/dL (ref <150)
VLDL: 18 mg/dL (ref 0–40)

## 2010-12-16 LAB — APTT: aPTT: 35 seconds (ref 24–37)

## 2010-12-18 ENCOUNTER — Encounter (INDEPENDENT_AMBULATORY_CARE_PROVIDER_SITE_OTHER): Payer: Self-pay | Admitting: Internal Medicine

## 2010-12-18 ENCOUNTER — Encounter: Payer: Self-pay | Admitting: Internal Medicine

## 2010-12-26 NOTE — Progress Notes (Signed)
Summary: sinus infection  Phone Note Call from Patient Call back at [facility] Phone [phone]   Summary of Call: requests appt -sinus infection Initial call taken by: Ernestine Mcmurray,  December 16, 2010 8:53 AM  Follow-up for Phone Call        No answer.  Dutch Quint RN  December 16, 2010 4:30 PM  Started Sunday night -- throat is sore, feels like there's "something in there" -makes pt. gag.  Coughing non-productive.  Feels post-nasal drip, sinuses draining, mucus is clear.  Ears feel stopped up, denies fever.  Headache, sinus pressure.  Cough wakes her up at night, has to turn a/c on, sleeps sitting up.  Denies SOB, chest hurts normally, unsure if CP persists because of respiratory symptoms.  Humidifies home, drinking more water.  [facility], was told to contact doctor, pharmacist wouldn't suggest anything OTC.  Tried saline spray, didn't work.  Nothing has worked that she's tried.  Acute appt. 12/18/10 with Dr. Delrae Alfred.  Follow-up by: Dutch Quint RN,  December 17, 2010 5:17 PM  Additional Follow-up for Phone Call Additional follow up Details #1::        In office.  Dutch Quint RN  December 18, 2010 11:08 AM

## 2010-12-26 NOTE — Assessment & Plan Note (Signed)
Summary: Poss. sinus infection   Vital Signs:  Patient profile:   50 year old female Menstrual status:  last cycle 1995 Weight:      280.13 pounds Temp:     98.2 degrees F oral Pulse rate:   71 / minute Pulse rhythm:   regular Resp:     18 per minute BP sitting:   159 / 89  (left arm) Cuff size:   large  Vitals Entered By: Hale Drone CMA (December 18, 2010 10:48 AM) CC: Poss. sinus infection since Sunday... Cough, nasal congestion, sore throat, night sweats, itchy and watery eyes... Denies vomiting and diahrrea.  Is Patient Diabetic? No Pain Assessment Patient in pain? yes       Does patient need assistance? Functional Status Self care Ambulation Normal   Primary Care Provider:  Tereso Newcomer, PA-C  CC:  Poss. sinus infection since Sunday... Cough, nasal congestion, sore throat, night sweats, and itchy and watery eyes... Denies vomiting and diahrrea. .  History of Present Illness: Nasal congestion, pain in face.  Lot of posterior pharyngeal drainage.  Cough is productive of clear mucous.  Lot of eye and nose itching and sneezing.  Eyes are watery.  Ears are plugged up.  No fever.  Has had problems in spring with this before.  Current Medications (verified): 1)  Lisinopril-Hydrochlorothiazide 20-25 Mg  Tabs (Lisinopril-Hydrochlorothiazide) .... Take 1 Tablet By Mouth Every Morning 2)  Ibuprofen 800 Mg Tabs (Ibuprofen) .Marland Kitchen.. 1 Tablet By Mouth Two Times A Day As Needed For Pain  **take With Food** 3)  Amlodipine Besylate 5 Mg Tabs (Amlodipine Besylate) .... One Tablet By Mouth Daily For Blood Pressure 4)  Aspirin 325 Mg Tabs (Aspirin) .... Take 1 Tablet By Mouth Once A Day 5)  Simvastatin 20 Mg Tabs (Simvastatin) .... Take 1 Tab By Mouth At Bedtime 6)  Metoprolol Tartrate 50 Mg Tabs (Metoprolol Tartrate) .... Take 1/2 By Mouth Two Times A Day For One Week, Then Increase To Take 1 Tablet By Mouth Two Times A Day 7)  Percocet 5-325 Mg Tabs (Oxycodone-Acetaminophen) .... Take 1-2 By  Mouth Once Daily Only As Needed For Severe Headaches  Allergies (verified): 1)  Naprosyn  Physical Exam  General:  Obese, NAD Eyes:  Allergic shiners.  No injection currently Ears:  External ear exam shows no significant lesions or deformities.  Otoscopic examination reveals clear canals, tympanic membranes are intact bilaterally without bulging, retraction, inflammation or discharge. Hearing is grossly normal bilaterally. Nose:  Nasal mucosa swollen with clear discharge Mouth:  pharynx pink and moist.   Neck:  No deformities, masses, or tenderness noted. Lungs:  Normal respiratory effort, chest expands symmetrically. Lungs are clear to auscultation, no crackles or wheezes. Heart:  Normal rate and regular rhythm. S1 and S2 normal without gallop, murmur, click, rub or other extra sounds.   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 The following medications were removed from the medication list:    Veramyst 27.5 Mcg/spray Susp (Fluticasone furoate) .Marland Kitchen... 2 sprays each nostril daily Her updated medication list for this problem includes:    Loratadine 10 Mg Tabs (Loratadine) .Marland Kitchen... 1 tab by mouth daily    Fluticasone Propionate 50 Mcg/act Susp (Fluticasone propionate) .Marland Kitchen... 2 sprays each nostril daily  Complete Medication List: 1)  Lisinopril-hydrochlorothiazide 20-25 Mg Tabs (Lisinopril-hydrochlorothiazide) .... Take 1 tablet by mouth every morning 2)  Ibuprofen 800 Mg Tabs (Ibuprofen) .Marland Kitchen.. 1 tablet by mouth two times a  day as needed for pain  **take with food** 3)  Amlodipine Besylate 5 Mg Tabs (Amlodipine besylate) .... One tablet by mouth daily for blood pressure 4)  Aspirin 325 Mg Tabs (Aspirin) .... Take 1 tablet by mouth once a day 5)  Simvastatin 20 Mg Tabs (Simvastatin) .... Take 1 tab by mouth at bedtime 6)  Metoprolol Tartrate 50 Mg Tabs (Metoprolol tartrate) .... Take 1/2 by mouth two times  a day for one week, then increase to take 1 tablet by mouth two times a day 7)  Percocet 5-325 Mg Tabs (Oxycodone-acetaminophen) .... Take 1-2 by mouth once daily only as needed for severe headaches 8)  Loratadine 10 Mg Tabs (Loratadine) .Marland Kitchen.. 1 tab by mouth daily 9)  Fluticasone Propionate 50 Mcg/act Susp (Fluticasone propionate) .... 2 sprays each nostril daily  Patient Instructions: 1)  Needs OV for chronic illnesses Prescriptions: FLUTICASONE PROPIONATE 50 MCG/ACT SUSP (FLUTICASONE PROPIONATE) 2 sprays each nostril daily  #1 x 6   Entered and Authorized by:   Julieanne Manson MD   Signed by:   Julieanne Manson MD on 12/18/2010   Method used:   Faxed to ...       [facility] (retail)       [address] [place], [place]  [address]       Ph: [phone]       Fax: [phone]   RxID:   [id number] VERAMYST 27.5 MCG/SPRAY SUSP (FLUTICASONE FUROATE) 2 sprays each nostril daily  #1 x 6   Entered and Authorized by:   Julieanne Manson MD   Signed by:   Julieanne Manson MD on 12/18/2010   Method used:   Faxed to ...       [facility] (retail)       [address] [place], [place]  [address]       Ph: [phone]       Fax: [phone]   RxID:   [id number] LORATADINE 10 MG TABS (LORATADINE) 1 tab by mouth daily  #30 x 6   Entered and Authorized by:   Julieanne Manson MD   Signed by:   Julieanne Manson MD on 12/18/2010   Method used:   Faxed to ...       [facility] - [facility] (retail)       [address] [place], [place]  [address]       Ph: [phone]       Fax: [phone]   RxID:   [id number] METOPROLOL TARTRATE 50 MG TABS (METOPROLOL TARTRATE) Take 1/2 by mouth two times a day for one week, then increase to Take 1 tablet by mouth two times a day  #60 x 3   Entered and Authorized by:   Julieanne Manson MD   Signed by:    Julieanne Manson MD on 12/18/2010   Method used:   Faxed to ...       [facility] - [facility] (retail)       [address] [place], [place]  [address]       Ph: [phone]       Fax: [phone]   RxID:   [id number] AMLODIPINE BESYLATE 5 MG TABS (AMLODIPINE BESYLATE) One tablet by mouth daily for blood pressure  #90 x 3   Entered and Authorized by:  Julieanne Manson MD   Signed by:   Julieanne Manson MD on 12/18/2010   Method used:   Faxed to ...       [facility] - [facility] (retail)       [address] [place], [place]  [address]       Ph: [phone]       Fax: [phone]   RxID:   [id number] LISINOPRIL-HYDROCHLOROTHIAZIDE 20-25 MG  TABS (LISINOPRIL-HYDROCHLOROTHIAZIDE) Take 1 tablet by mouth every morning  #90 x 3   Entered and Authorized by:   Julieanne Manson MD   Signed by:   Julieanne Manson MD on 12/18/2010   Method used:   Faxed to ...       [facility] (retail)       [address] [place], [place]  [address]       Ph: [phone]       Fax: [phone]   RxID:   [id number]    Orders Added: 1)  Est. Patient Level III [phone]

## 2011-01-06 IMAGING — US US EXTREM UP *R* LTD
1 series · 14 of 22 positions shown · non-contrast
Comparison: 

CLINICAL DATA: Mass in right upper arm

RIGHT UPPER EXTREMITY SOFT TISSUE ULTRASOUND LIMITED
TECHNIQUE: Routine

[Series 1: us extrem up *right* ltd · 0.08mm/px · 14 of 22 slices shown]
[im 1/22]
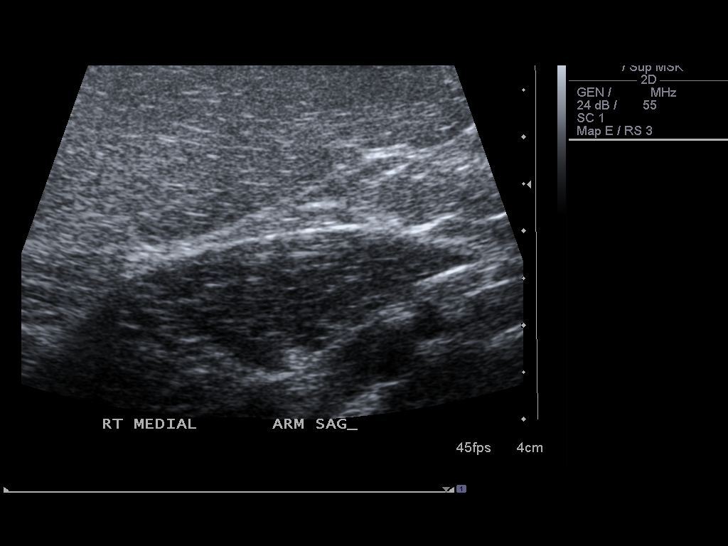
[im 3/22]
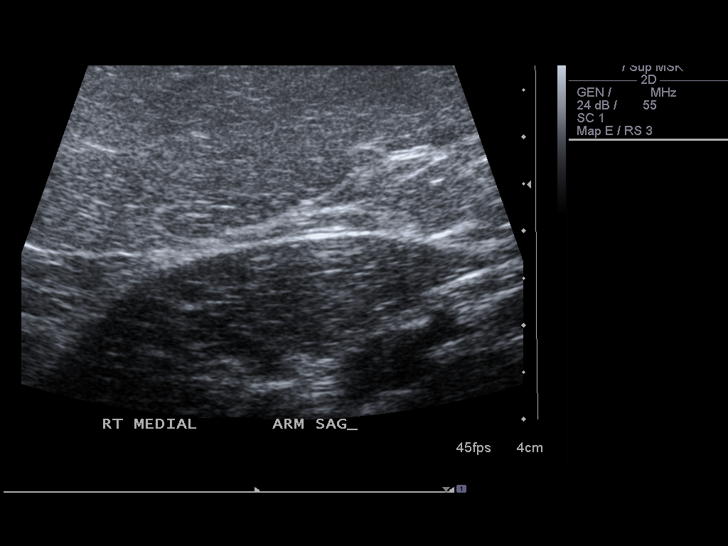
[im 4/22]
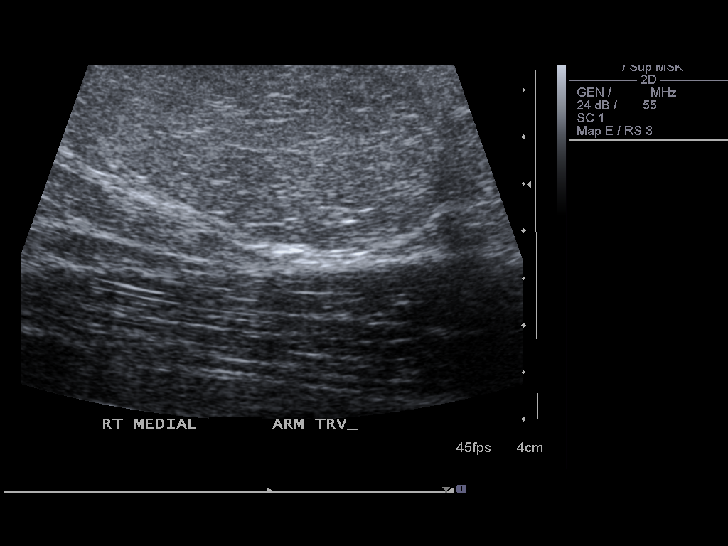
[im 6/22]
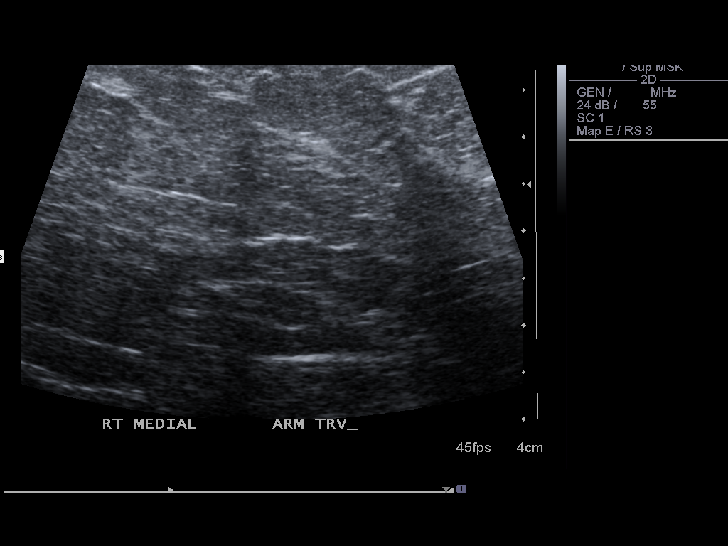
[im 8/22]
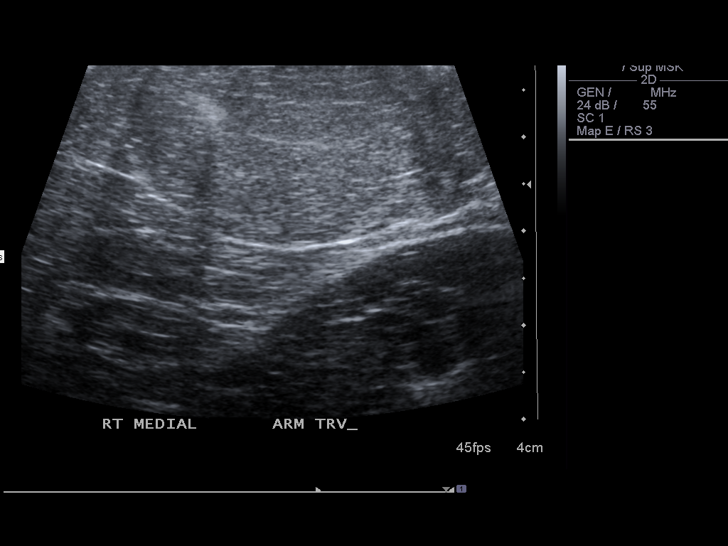
[im 9/22]
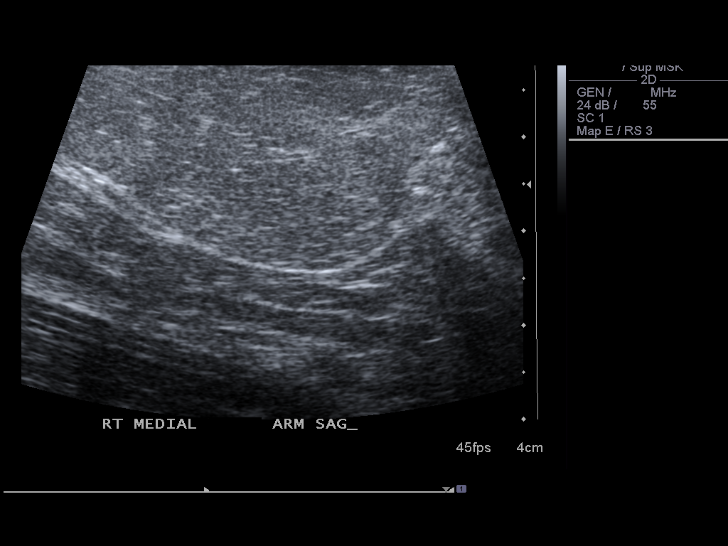
[im 11/22]
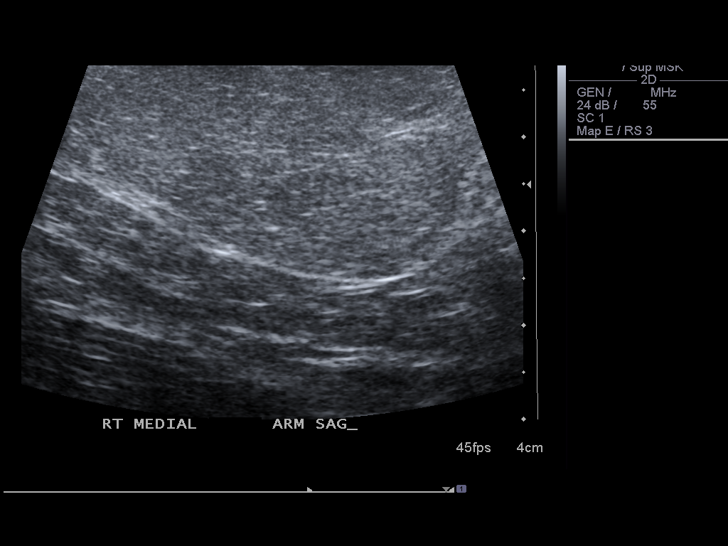
[im 12/22]
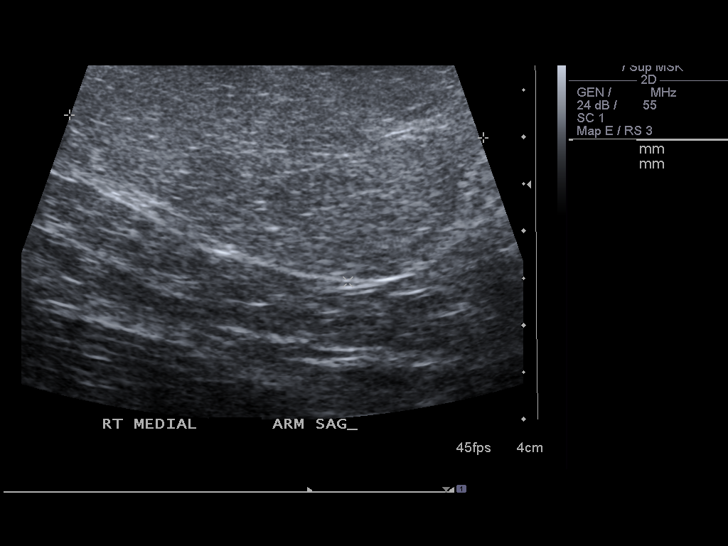
[im 14/22]
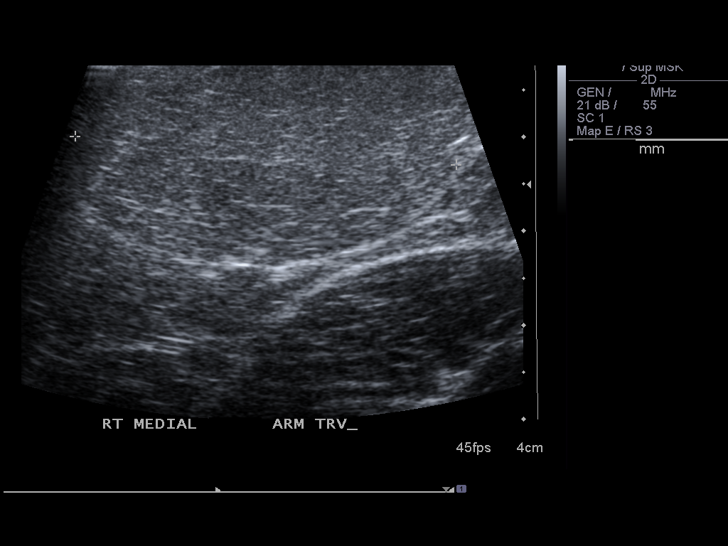
[im 15/22]
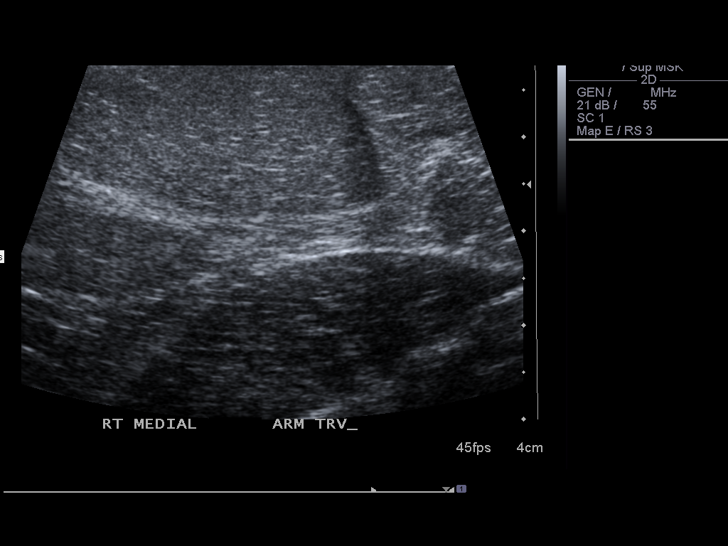
[im 17/22]
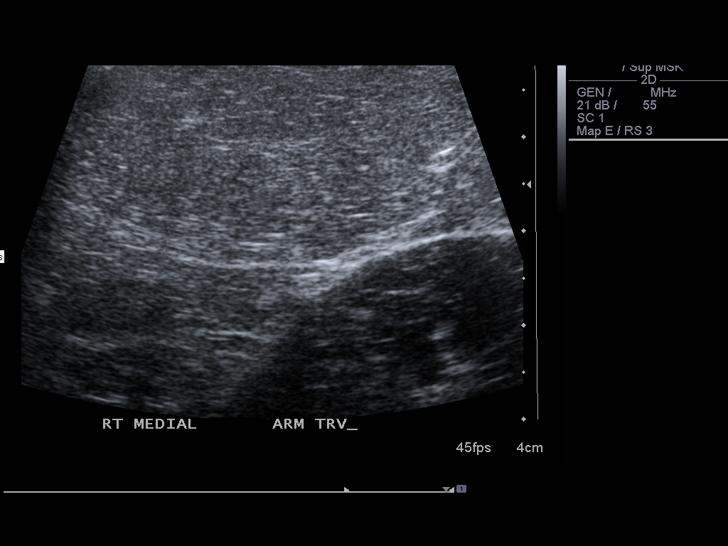
[im 19/22]
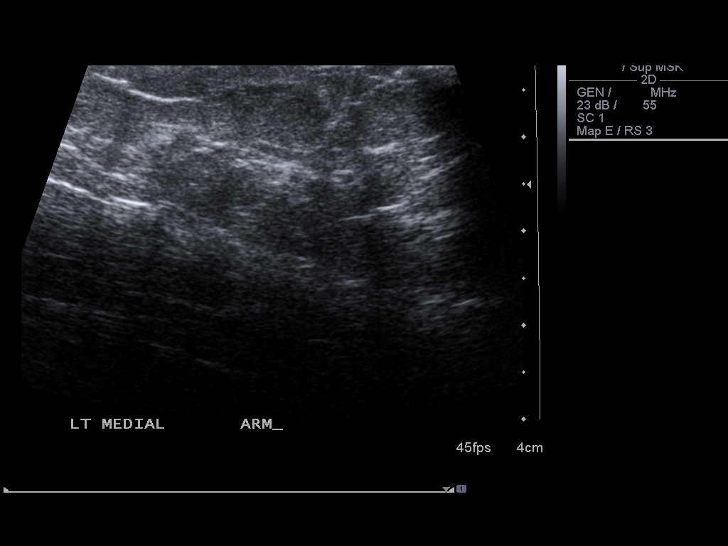
[im 20/22]
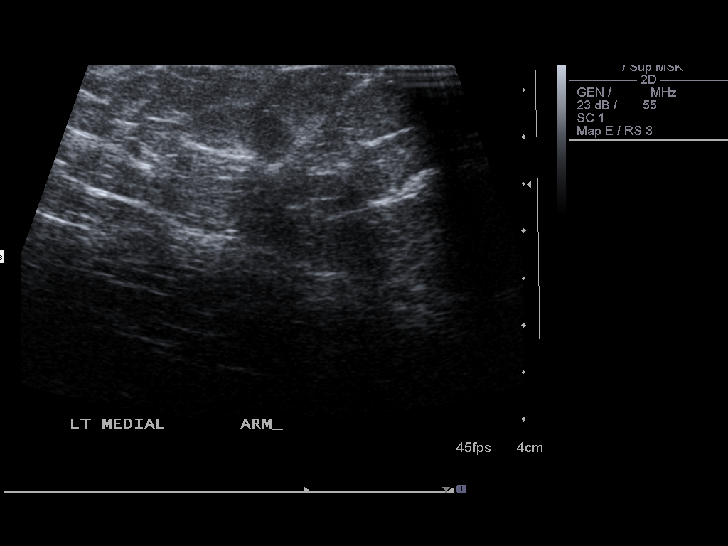
[im 22/22]
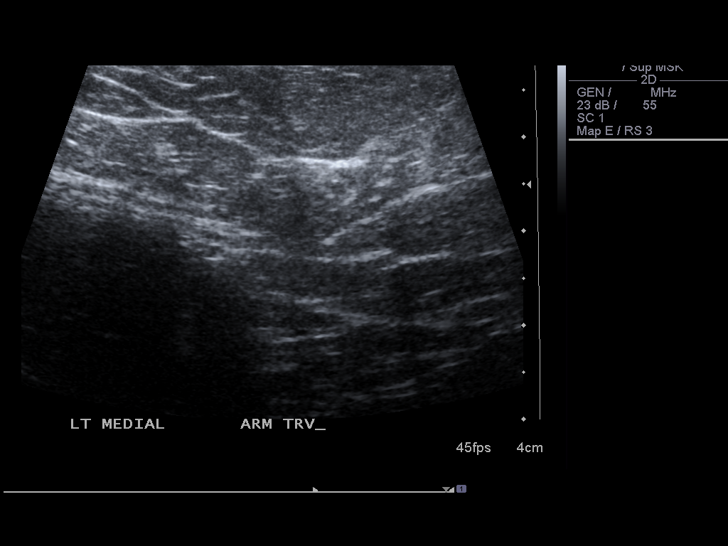

[14 of 22 positions shown; findings below may reference images not displayed]

FINDINGS: There is a hyperechoic lesion in the right upper arm
soft tissues that is fairly well delineated.  Its echo texture is
similar to surrounding subcutaneous fat.  It measures 4.4 x 2.4 x
4.1 cm.  It is most consistent with a lipoma.
IMPRESSION: Echo dense mass in the right upper extremity soft tissues is most
consistent with a lipoma.  See report.

## 2011-01-13 IMAGING — CT CT HEAD W/O CM
1 series · 16 of 30 positions shown, 20 images · non-contrast
Comparison: 05/12/2009

CLINICAL DATA: Headache, dizziness, left facial numbness and
weakness.

CT HEAD WITHOUT CONTRAST
TECHNIQUE: Contiguous axial images were obtained from the base of
the skull through the vertex without contrast

[Series 2: head_seq 4.5 h37s st · axial · 0.43mm/px · z∈[-144,-18]mm · 16 of 32 slices shown, 20 images]
[im 2/32  brain]
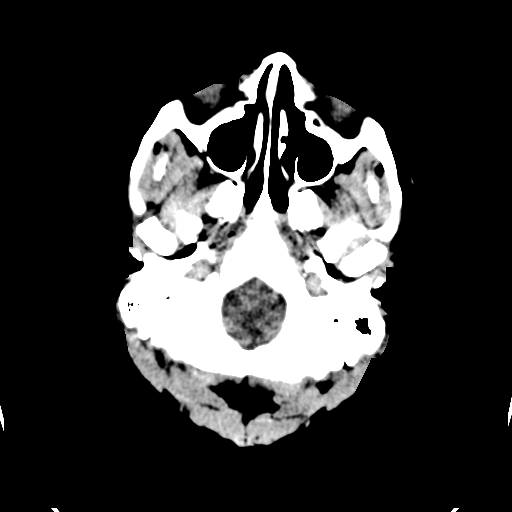
[im 2/32  bone]
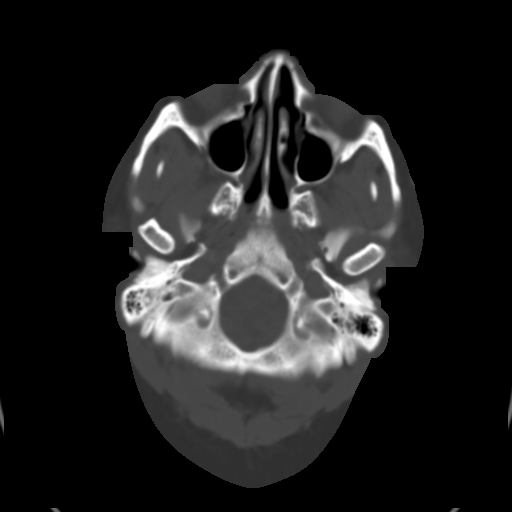
[im 4/32  brain]
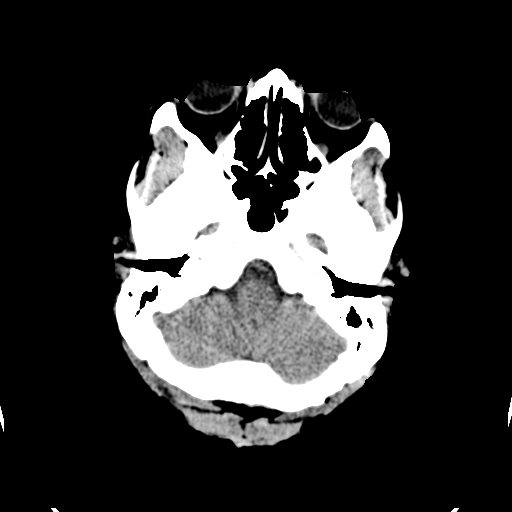
[im 6/32  brain]
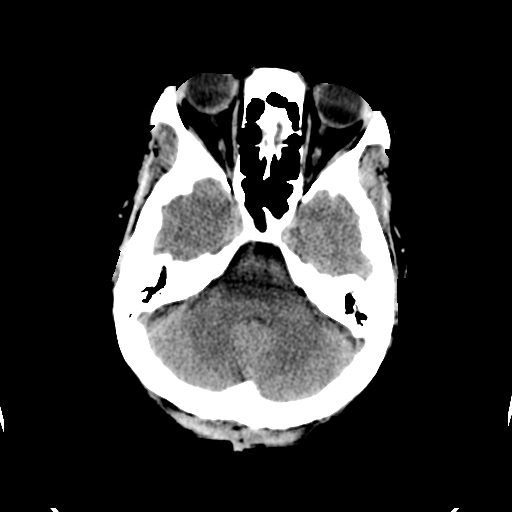
[im 8/32  brain]
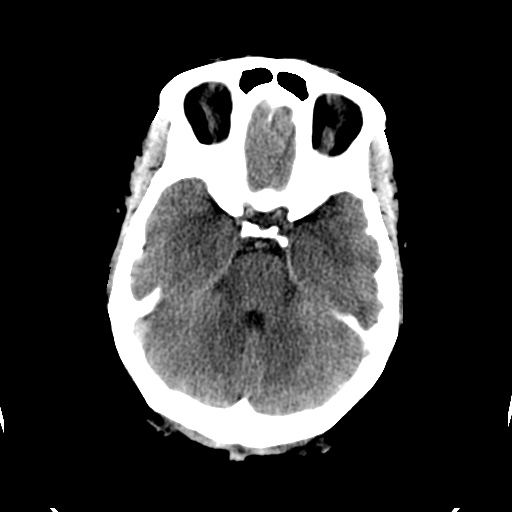
[im 9/32  brain]
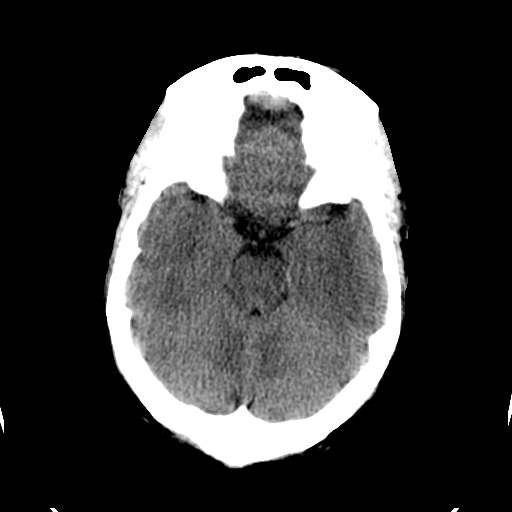
[im 9/32  bone]
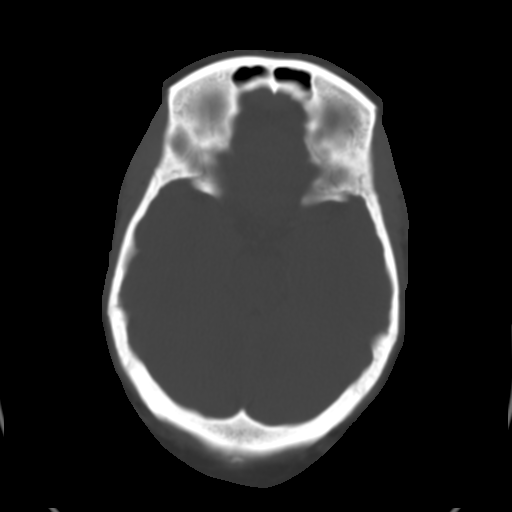
[im 11/32  brain]
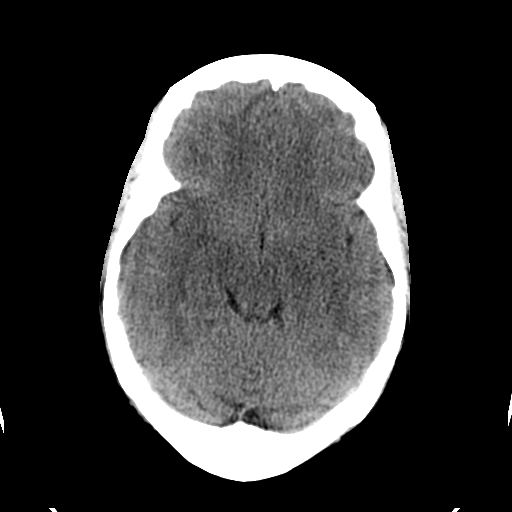
[im 13/32  brain]
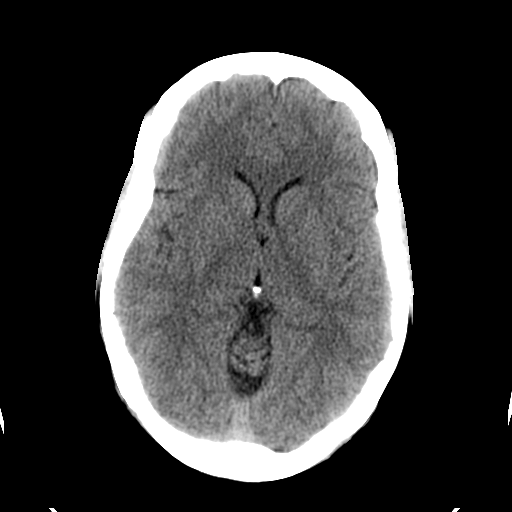
[im 15/32  brain]
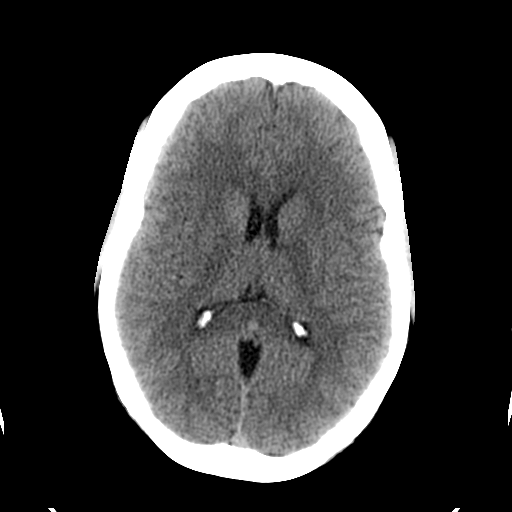
[im 17/32  brain]
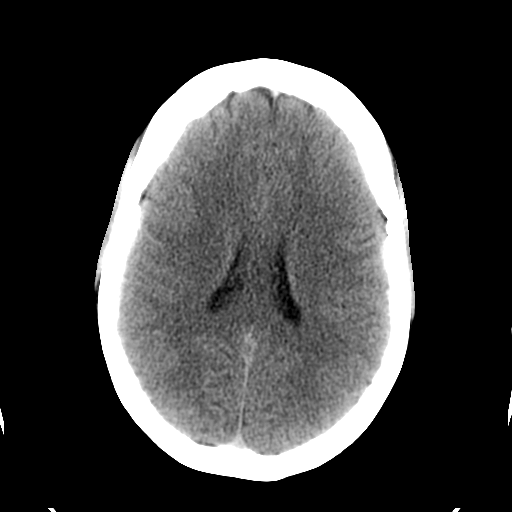
[im 17/32  bone]
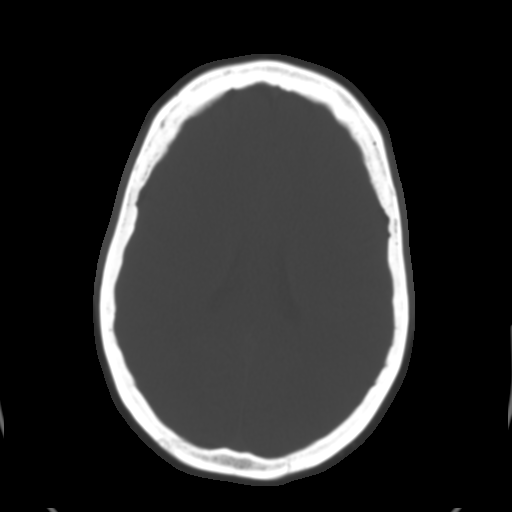
[im 19/32  brain]
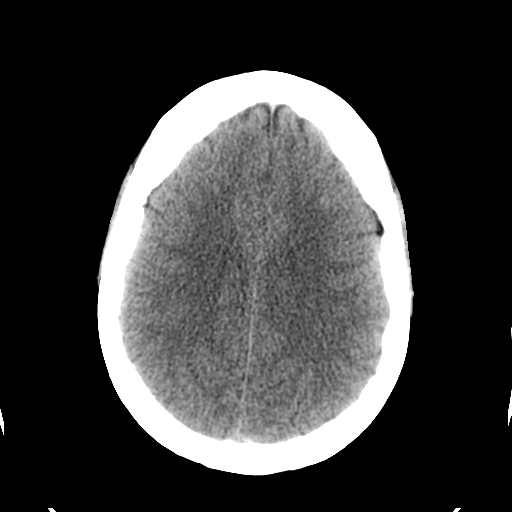
[im 21/32  brain]
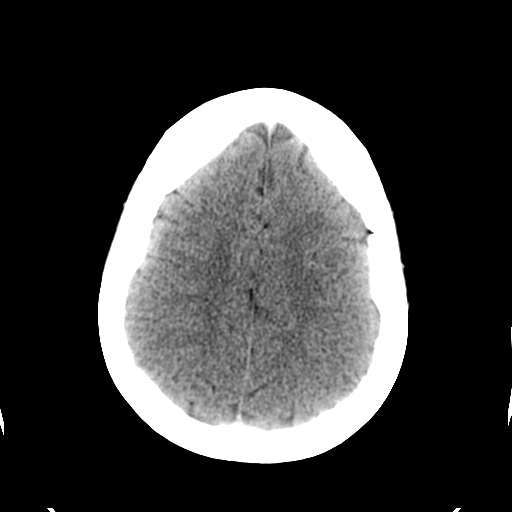
[im 23/32  brain]
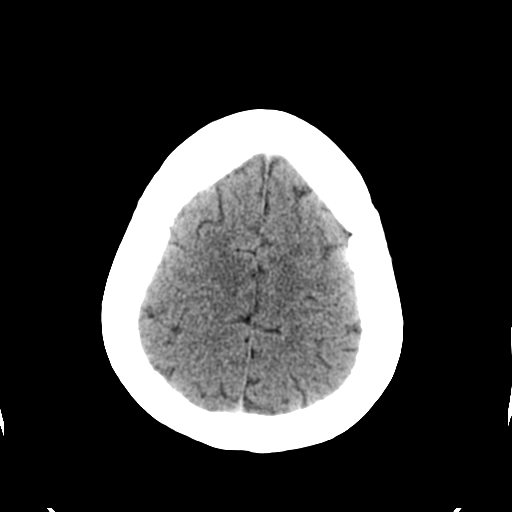
[im 24/32  brain]
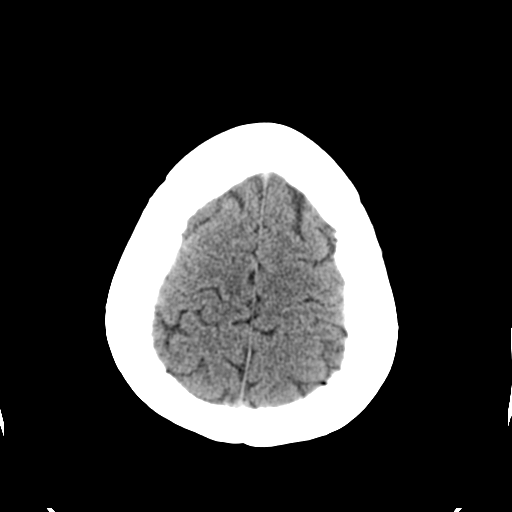
[im 24/32  bone]
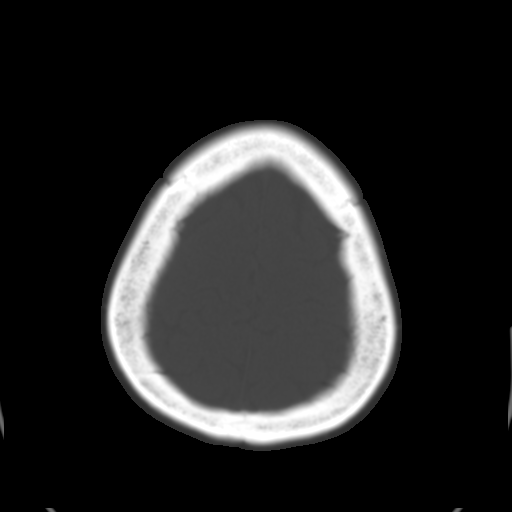
[im 26/32  brain]
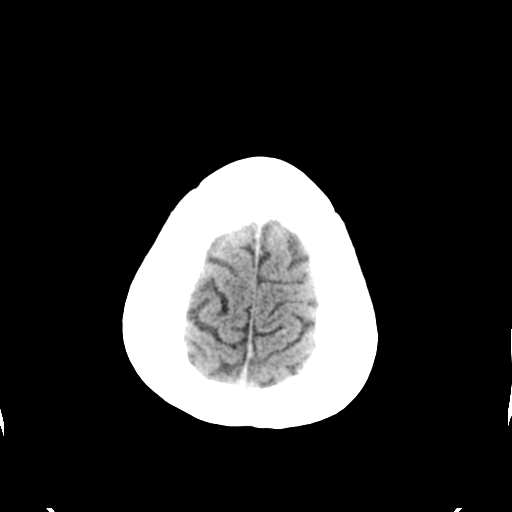
[im 28/32  brain]
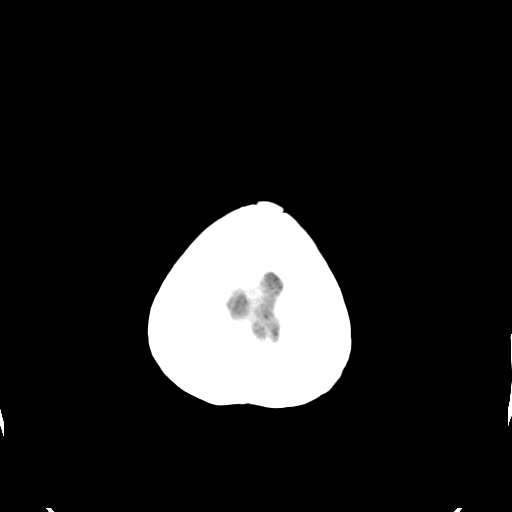
[im 30/32  brain]
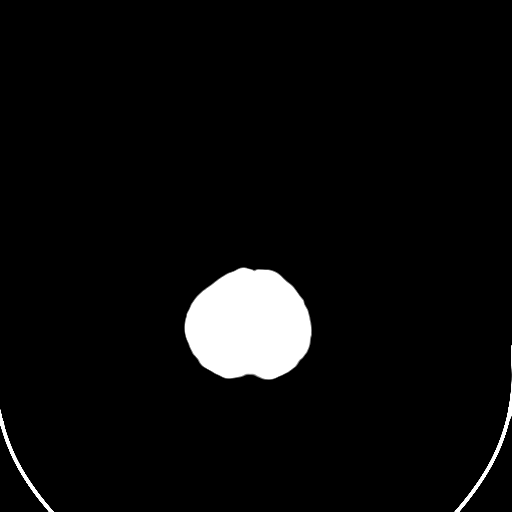

[16 of 30 positions shown; findings below may reference images not displayed]

FINDINGS: The brain has a normal appearance without evidence for
hemorrhage, acute infarction, hydrocephalus, or mass lesion.  There
is no extra axial fluid collection.  The skull and paranasal
sinuses are normal. Incidental superior vermian arachnoid cyst is
stable. Special attention is directed the left mastoid bone where
there is no middle ear or mastoid fluid.  No destructive process is
seen.
IMPRESSION: Normal CT of the head without contrast.  No change from prior
normal study.

## 2011-01-14 IMAGING — MR MR HEAD W/O CM
7 of 10 series · 25 of 48 positions shown · non-contrast
Comparison: Head CTs 02/11/2010 and earlier.

MRI HEAD WITHOUT CONTRAST

CLINICAL DATA: 48-year-old female with left facial numbness,
headache, dizziness.
TECHNIQUE: Multiplanar, multiecho pulse sequences of the brain and
surrounding structures were obtained according to standard protocol
without intravenous contrast.
TECHNIQUE: Angiographic images of the Circle of Willis were
obtained using MRA technique without  intravenous contrast.

[Series 3: T1 · sagittal · 5.0mm · 0.47mm/px · 2 of 24 slices shown]
[im 1/24]
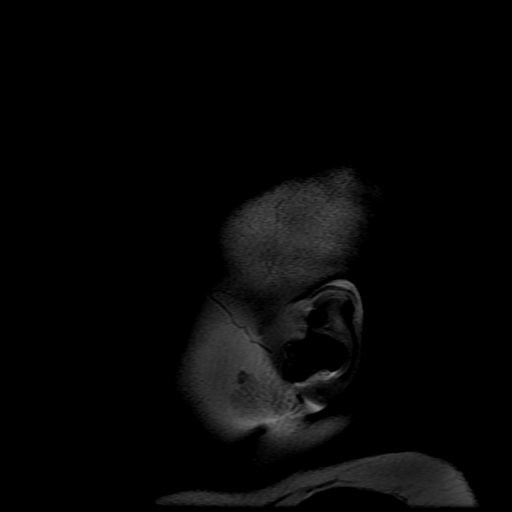
[im 24/24]
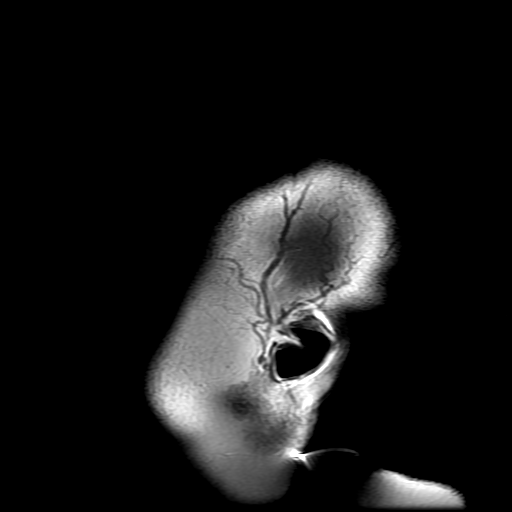

[Series 4: DWI · axial · 5.0mm · 1.09mm/px · z∈[-135,+10]mm · 6 of 60 slices shown (1 of 2)]
[im 1/60]
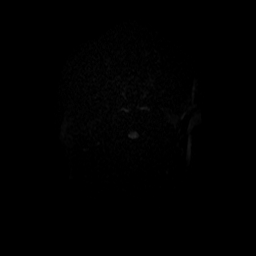
[im 12/60]
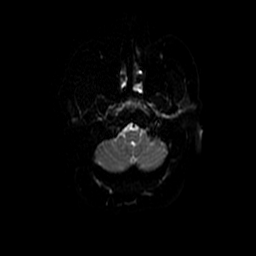
[im 24/60]
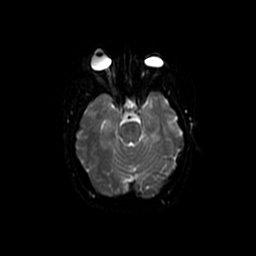
[im 36/60]
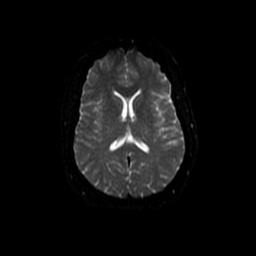
[im 48/60]
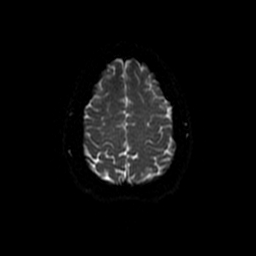
[im 60/60]
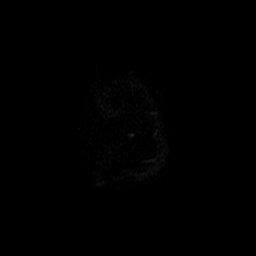

[Series 5: (id) mt fs · axial · 1.4mm · 0.39mm/px · z∈[-113,-76]mm · 4 of 141 slices shown]
[im 9/141]
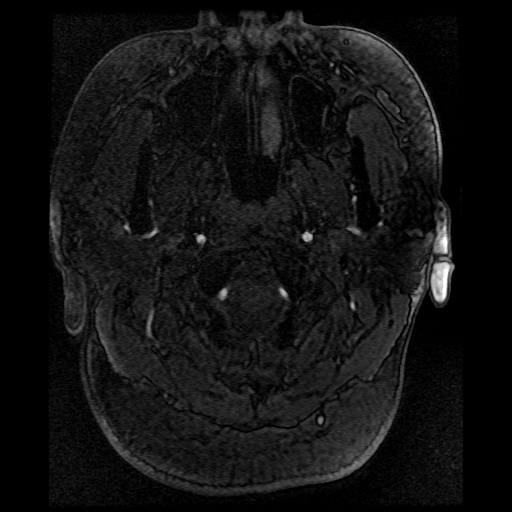
[im 27/141]
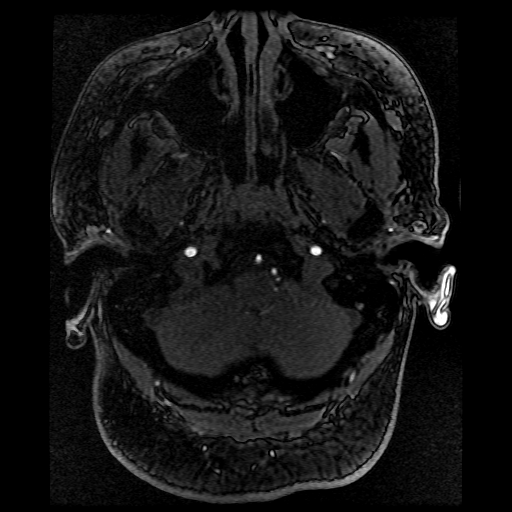
[im 44/141]
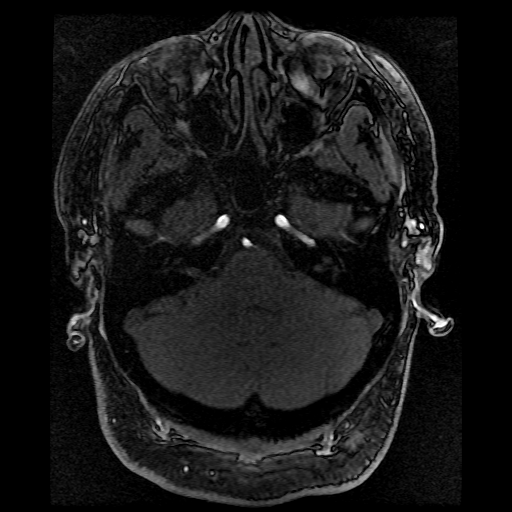
[im 62/141]
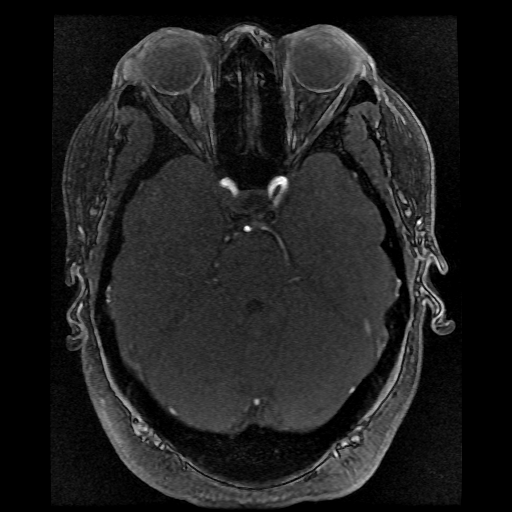

[Series 6: T2 · axial · 5.0mm · 0.43mm/px · z∈[-131,+5]mm · 3 of 22 slices shown (1 of 2)]
[im 1/22]
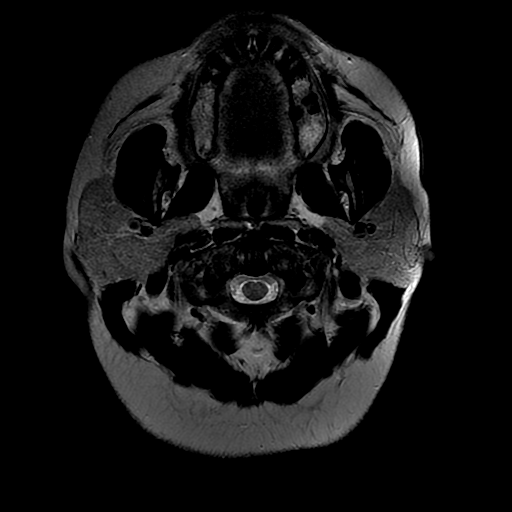
[im 11/22]
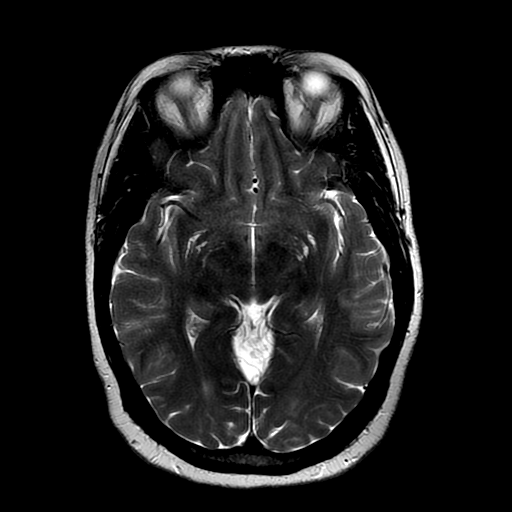
[im 22/22]
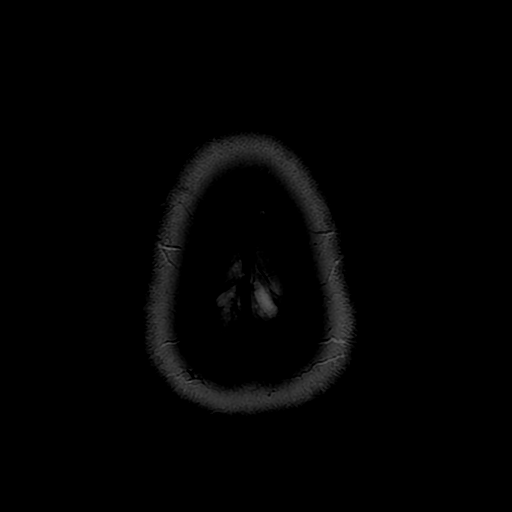

[Series 7: FLAIR · axial · 5.0mm · 0.43mm/px · z∈[-136,+10]mm · 3 of 22 slices shown]
[im 1/22]
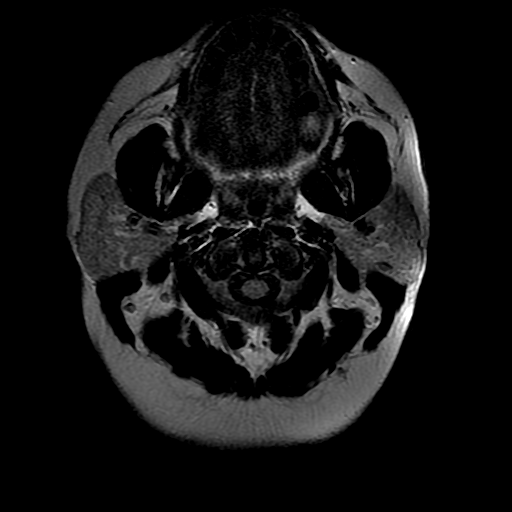
[im 11/22]
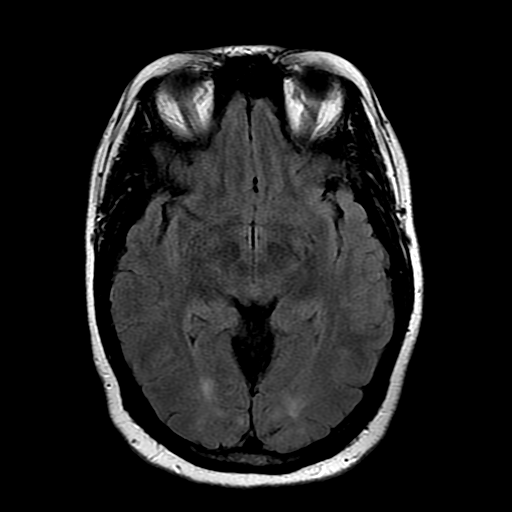
[im 22/22]
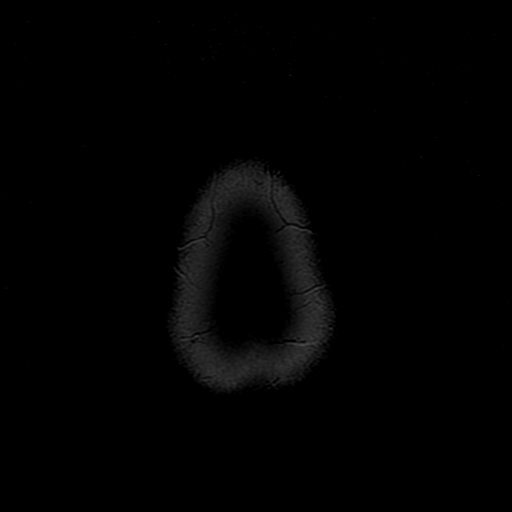

[Series 10: T2 · coronal · 5.0mm · 0.45mm/px · 3 of 24 slices shown (2 of 2)]
[im 1/24]
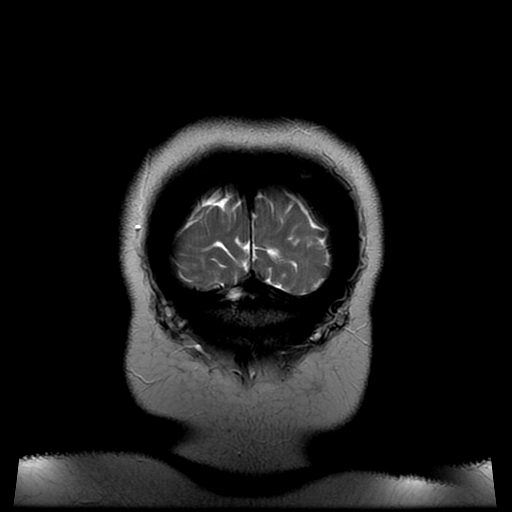
[im 12/24]
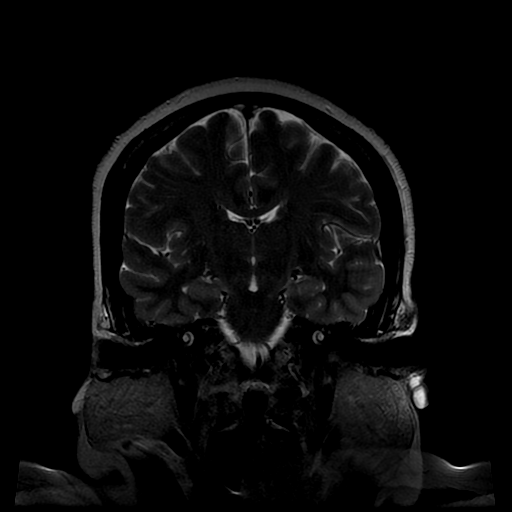
[im 24/24]
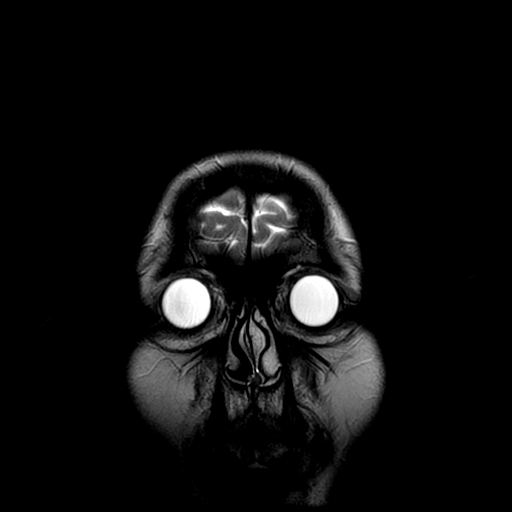

[Series 400: DWI · axial · 5.0mm · 1.09mm/px · z∈[-135,+10]mm · 4 of 30 slices shown (2 of 2)]
[im 1/30]
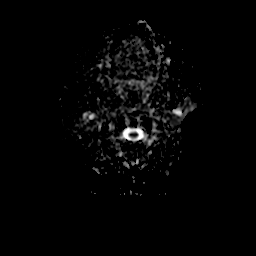
[im 10/30]
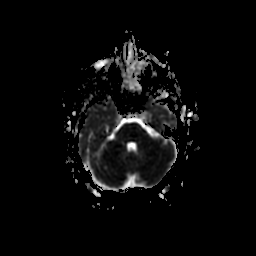
[im 20/30]
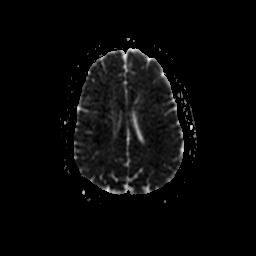
[im 30/30]
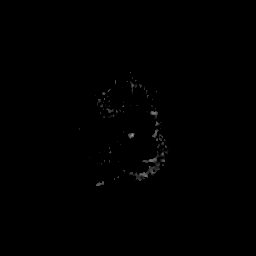

[25 of 48 positions shown; findings below may reference images not displayed]

FINDINGS: Mildly heterogeneous diffusion signal throughout the
brain, no convincing focus of restricted diffusion identified.
Cerebral volume is normal.  No midline shift, ventriculomegaly,
mass effect, evidence of mass lesion, extra-axial collection or
acute intracranial hemorrhage.  Cervicomedullary junction and
pituitary are within normal limits.  Major intracranial vascular
flow voids are preserved.Gray and white matter signal is within
normal limits for age throughout the brain.  Visualized paranasal
sinuses and mastoids are clear.  Visualized orbits and scalp soft
tissues are within normal limits.  Visualized cervical spine is
within normal limits.
IMPRESSION: Normal noncontrast MRI appearance of the brain for age.

MRA HEAD WITHOUT CONTRAST
FINDINGS: Antegrade flow in the posterior circulation.  Mild
dominance of the distal right vertebral artery.  Normal bilateral
PICA and AICA vessels.  Basilar artery, superior cerebellar
arteries and PCA origins are normal.  Normal posterior
communicating arteries.  Normal bilateral PCA branches.

Antegrade flow in both ICA siphons.  Ophthalmic artery and
posterior communicating artery origins are within normal limits.
Carotid termini and visualized ACA branches are within normal
limits.  Anterior communicating artery is diminutive or absent.
MCA origins and visualized branches are within normal limits;
incidental early right MCA branching with short M1 segment.
IMPRESSION: Normal intracranial MRA.

## 2011-02-11 NOTE — H&P (Signed)
Jodi Bowman, Jodi Bowman              ACCOUNT NO.:  [id number]   MEDICAL RECORD NO.:  [id number]          PATIENT TYPE:  INP   LOCATION:                               FACILITY:  [facility]   PHYSICIAN:  Lamar Laundry, MD      DATE OF BIRTH:  26-Dec-1960   DATE OF ADMISSION:  07/29/2008  DATE OF DISCHARGE:  07/30/2008                              HISTORY & PHYSICAL   CHIEF COMPLAINT:  Chest pain.   HISTORY OF PRESENT ILLNESS:  This is a 50 year old female with a history  of hypertension, originally diagnosed in the 52s, who presents to the ER  after experiencing some chest tightness and shortness of breath with  exertion for the past 2 days.  She said she had an episode of some chest  discomfort last night when she was walking around her house.  Subsequently she went to bed and the pain improved.  Then today, once  again, she was walking around her house and specifically going up and  down stairs, when she once again felt the complaint of what she  describes as a chest pressure in the center of her chest; also  accompanied by some increased dyspnea.  She states that this lasted for  approximately an hour and a half before abating.  She states she has  never had a symptom like this before.  She has no previous history of  cardiac disease and never had any reason to have a stress test.  She  denies having any headache, blood in her urine or visual changes  (despite having a high blood pressure in the ER).   ALLERGIES:  NAPROSYN (which causes stomach upset).   MEDICATIONS:  The only medication is lisinopril 40 mg p.o. daily.   PAST MEDICAL HISTORY:  1. Hypertension, which she was originally diagnosed having in the 30s.  2. Migraine.  3. Status post C-section.  4. Status post hysterectomy.  5. Status post bilateral arthroscopic knee surgery.   SOCIAL HISTORY:  She lives in [place].  She is separated.  She has 8  children.  She is currently going to school and obtaining a degree in  business administration.  She denies the use of tobacco, alcohol and  illicit drugs.   FAMILY HISTORY:  Her brother just died in 2023/01/14 of some complication of  a stroke and an MI; he was in his late 39s when this occurred.   REVIEW OF SYSTEMS:  A 12-point review of systems was performed and was  negative other than complaints of chest pain and shortness of breath, as  previously detailed in the HPI.   PHYSICAL EXAMINATION:  VITAL SIGNS:  Temperature 97.3, blood pressure  183/102, pulse 83, respirations 18, saturating well on room air.  GENERAL:  This is a middle-aged [place] female in no acute  distress.  HEENT:  Normocephalic and atraumatic.  Extraocular movements intact.  Moist mucous membranes.  NECK:  No jugular venous distention.  CARDIOVASCULAR:  Regular rate and rhythm.  No murmurs, rubs or gallops.  LUNGS:  Clear to auscultation bilaterally.  No crackles, wheezes or  rhonchi.  ABDOMEN:  Soft.  Morbidly obese.  Nontender and nondistended.  Normal  active bowel sounds.  EXTREMITIES:  No pretibial edema.  NEUROLOGIC:  Awake, alert and oriented x3.  No focal, motor or sensory  deficits.  SKIN:  No rashes.   LABORATORY STUDIES:  WBC 1.5, hemoglobin 12.9, hematocrit 39.3,  platelets 308.  INR 0.9.  Sodium 138, potassium 3.9, chloride 106,  bicarb 27, BUN 10, creatinine 0.8.  LFTs are within normal limits.  Albumin is low at 3.4.  Troponin returned negative at less than 0.05.  Urinalysis was negative. An EKG was performed in the ER, revealing the  patient to be in normal sinus rhythm without any ST or T-wave changes.  A chest x-ray was also performed, and that revealed no acute  cardiopulmonary abnormalities.   ASSESSMENT AND PLAN:  This is a 50 year old female with hypertensive  urgency and chest pain, which was resolved by the time that she came to  the ER.   1. Hypertensive Urgency:  Labetalol 20 mg IV was given in the ER, and      successfully lowered the blood  pressure to 170/80.  The patient      reports that she does not check her blood pressure regularly, but      when she does intermittently check it at outpatient basis for the      last several months it is in the 175/90s range.  Therefore, this      patient has returned back to her uncontrolled baseline.  We will      continue her lisinopril 40 mg p.o. daily.  We will add metoprolol      25 mg p.o. b.i.d., the first dose to be given tonight.      Additionally, we will provide the patient with p.r.n. hydralazine      for systolic blood pressures greater than 170 and titrate her      antihypertensive medicines as necessary to achieve a more      reasonable blood pressure by the time of discharge.  2. Chest Pain:  We will monitor the patient on telemetry.  We will      check serial troponins.  We will provide her with aspirin 325 mg      p.o. daily. and p.r.n. sublingual nitroglycerin.  She will be      n.p.o. after midnight.  She would benefit from some form of stress      test, either conducted at this admission or on an outpatient basis      to evaluate her complaint of exertional shortness of breath and      dyspnea.  3. Prophylaxis:  We will place this patient on subcutaneous heparin.      Additionally we will add Protonix 40 mg p.o. daily, in case this      might have any bearing on affecting her complaints of chest pain.   The patient wishes to be a FULL CODE.      Lamar Laundry, MD  Electronically Signed     HR/MEDQ  D:  07/29/2008  T:  07/29/2008  Job:  [id number]

## 2011-02-14 NOTE — Discharge Summary (Signed)
NAMEMONSERRAT, VIDAURRI              ACCOUNT NO.:  [id number]   MEDICAL RECORD NO.:  [id number]          PATIENT TYPE:  INP   LOCATION:  1443                         FACILITY:  [facility]   PHYSICIAN:  Hind I Elsaid, MD      DATE OF BIRTH:  [birth year]   DATE OF ADMISSION:  07/29/2008  DATE OF DISCHARGE:  07/30/2008                               DISCHARGE SUMMARY   DISCHARGE DIAGNOSES:  1. Uncontrolled hypertension.  2. Atypical chest pain.  3. Obesity.   DISCHARGE MEDICATIONS:  1. Lopressor 25 mg daily.  2. Aspirin 81 mg daily.  3. Lisinopril 40 mg daily.   Appointment was made to see Ritta Slot, MD for outpatient Myoview at  [facility].   HISTORY OF PRESENT ILLNESS:  This is a 50 year old female with a history  of hypertension who presented to the emergency room with chest tightness  and shortness of breath.  Admitted and found to have elevated blood  pressure at the hospital, mainly one systolic blood pressure of 183/102.  The patient admitted to the hospital for further adjustment of her high  blood pressure.  The patient was started on beta-blocker, Lisinopril  with good control with high blood pressure.  cardeology consulted for  evaluation of her chest pain and recommended outpatient Myoview.  The  patient will continue with aspirin and a statin.  At this time we feel  the patient is medically stable to be discharged home.  Follow up with  her primary care physician for further adjustment of high blood pressure  and follow up with Dr Lynnea Ferrier, MD for [facility].      Hind Bosie Helper, MD  Electronically Signed     HIE/MEDQ  D:  08/18/2008  T:  08/18/2008  Job:  [id number]

## 2011-03-08 IMAGING — CR DG THORACIC SPINE 2V
4 series · 4 of 4 positions shown · non-contrast
Comparison: PA and lateral chest 07/29/2008.

CLINICAL DATA: Back pain.

THORACIC SPINE - 2 VIEW

[t t-spine a.p.]
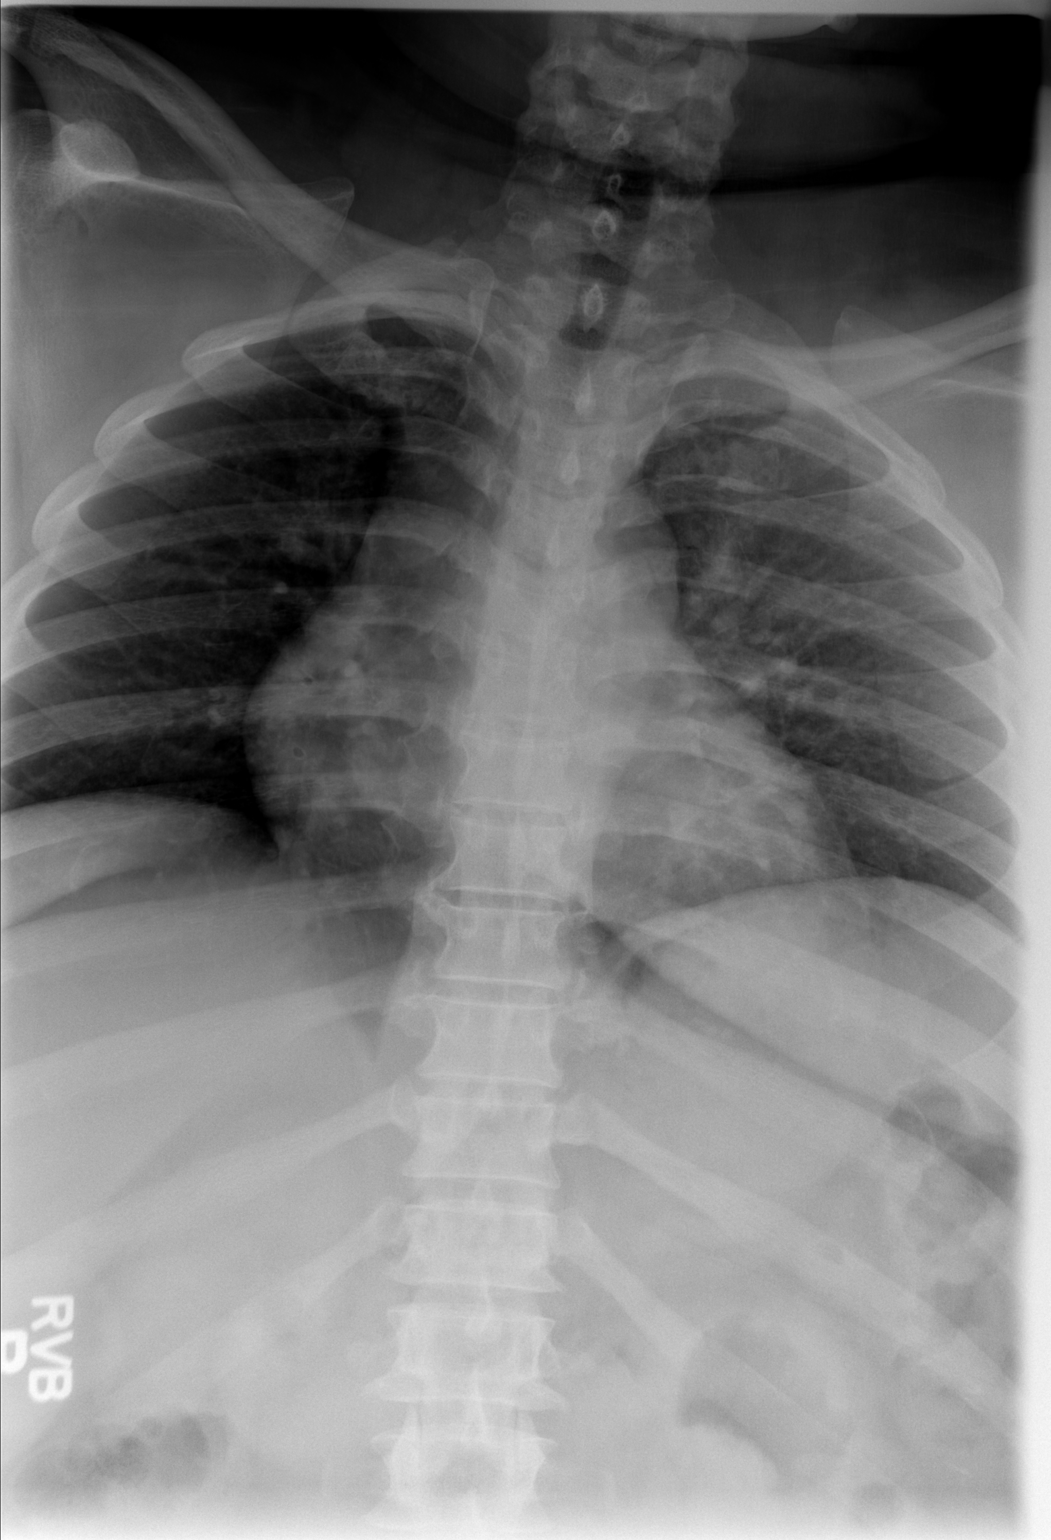

[t t-spine lat *]
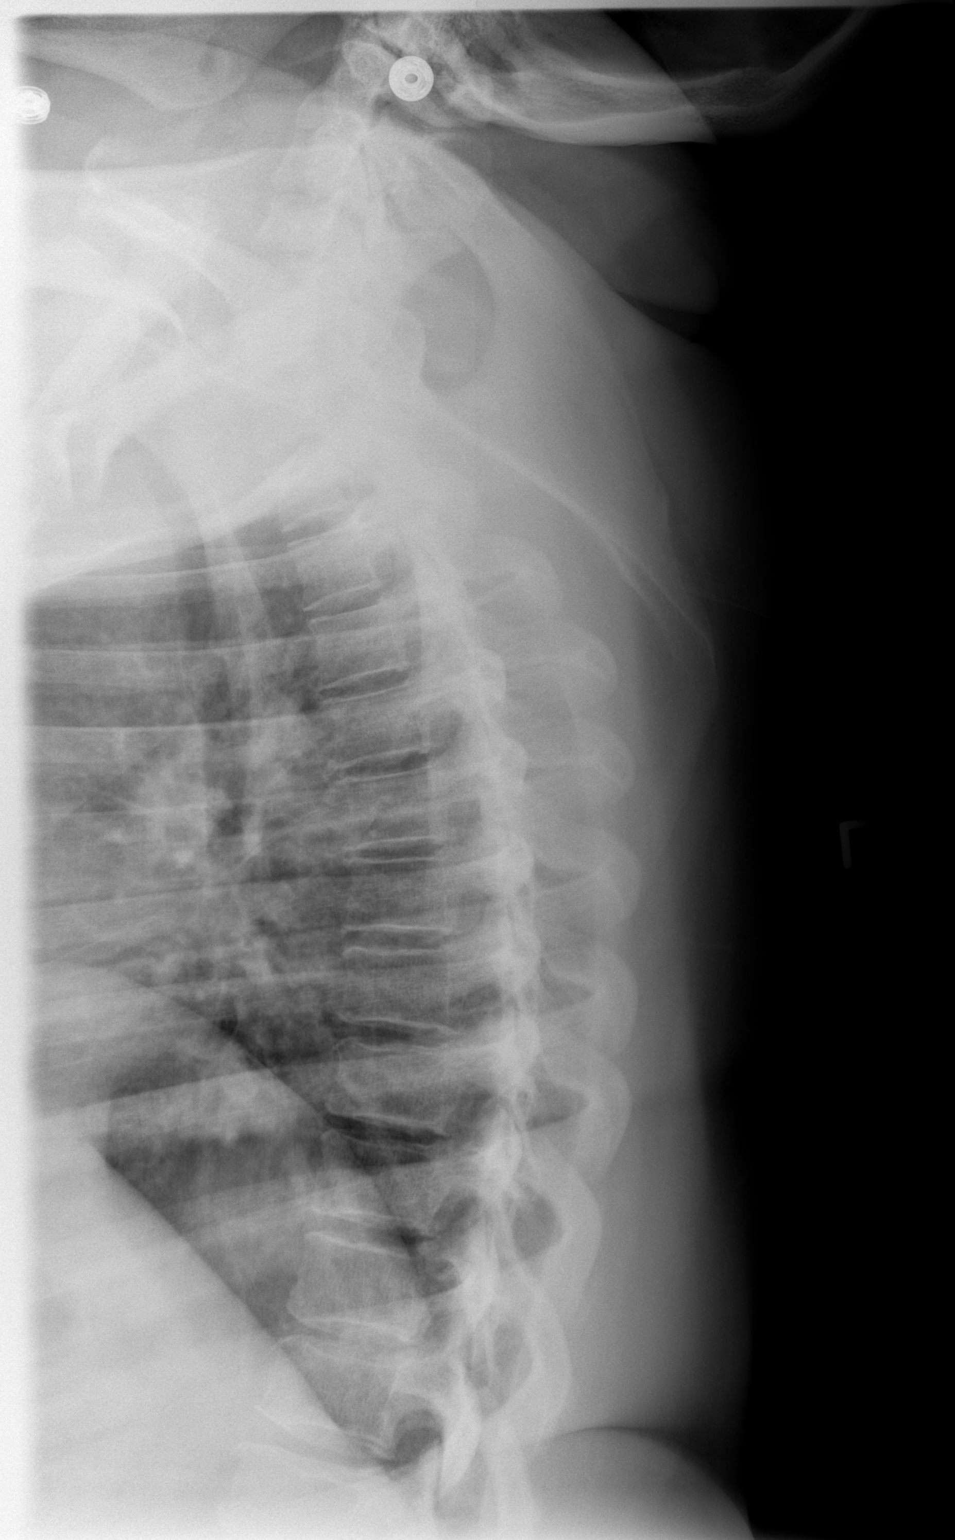

[t swimmers * (1 of 2)]
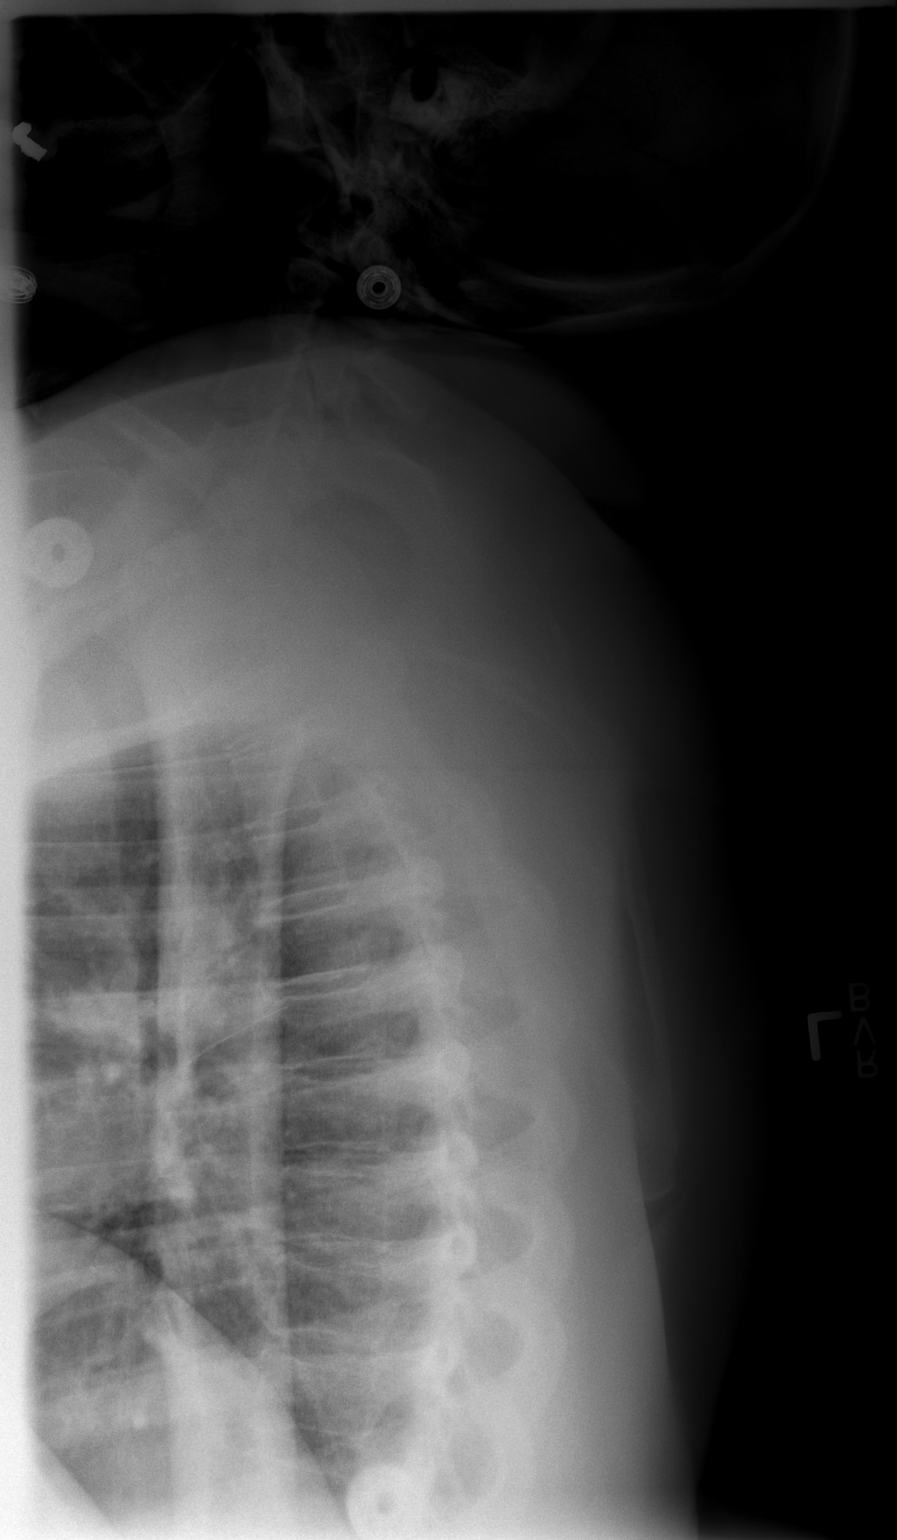

[t swimmers * (2 of 2)]
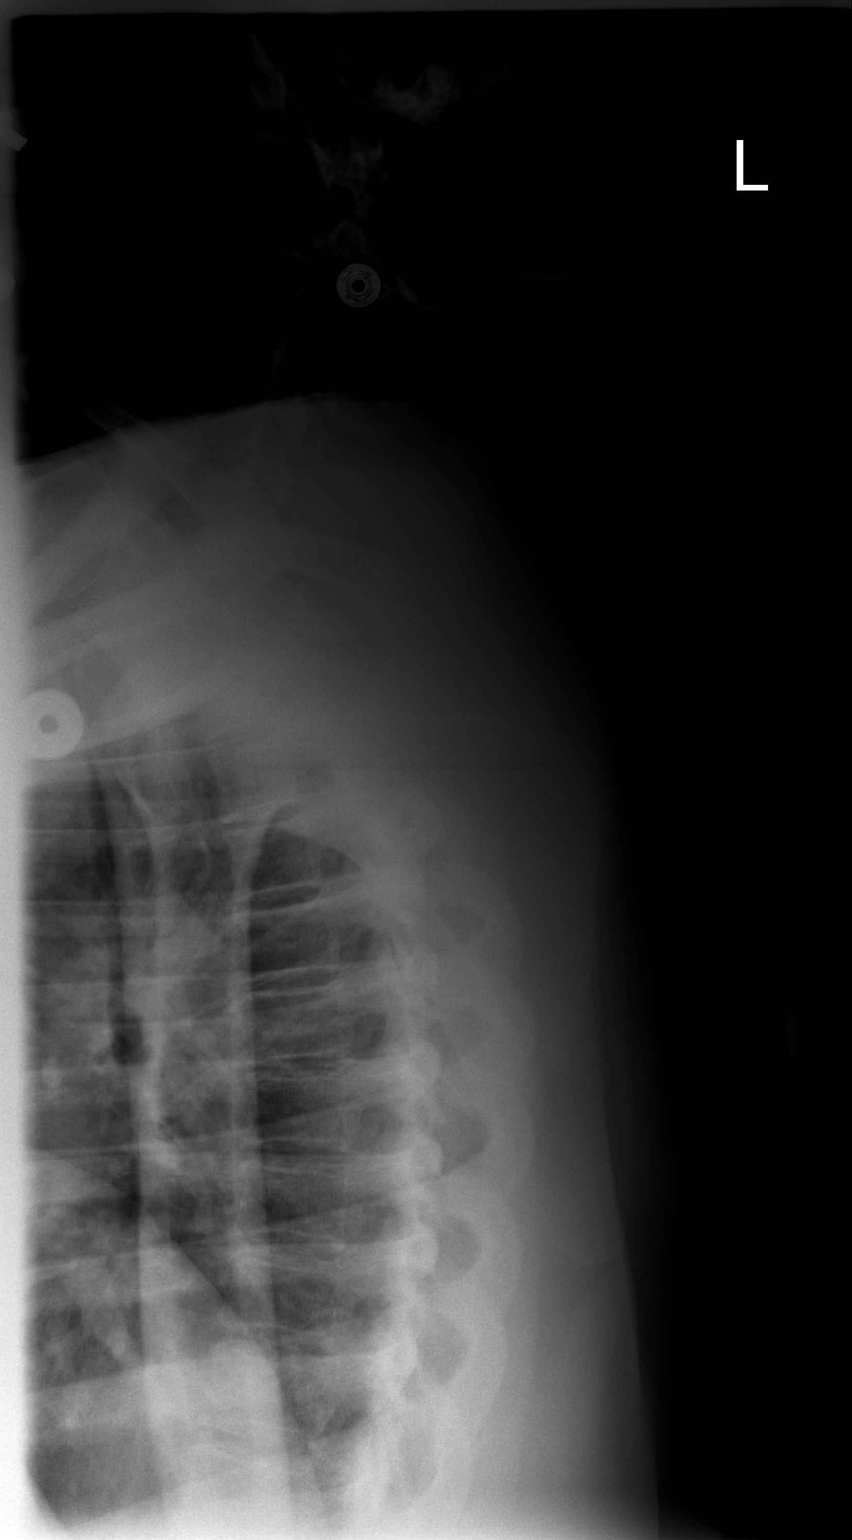

[4 of 4 positions shown; findings below may reference images not displayed]

FINDINGS: Vertebral body height and alignment are normal.  No
notable degenerative disease.
IMPRESSION: No acute finding.

## 2011-03-08 IMAGING — CR DG CHEST 2V
2 series · 2 of 2 positions shown · non-contrast
Comparison: PA and lateral chest 07/29/2008.

CLINICAL DATA: Chest and back pain.

CHEST - 2 VIEW

[w chest pa *]
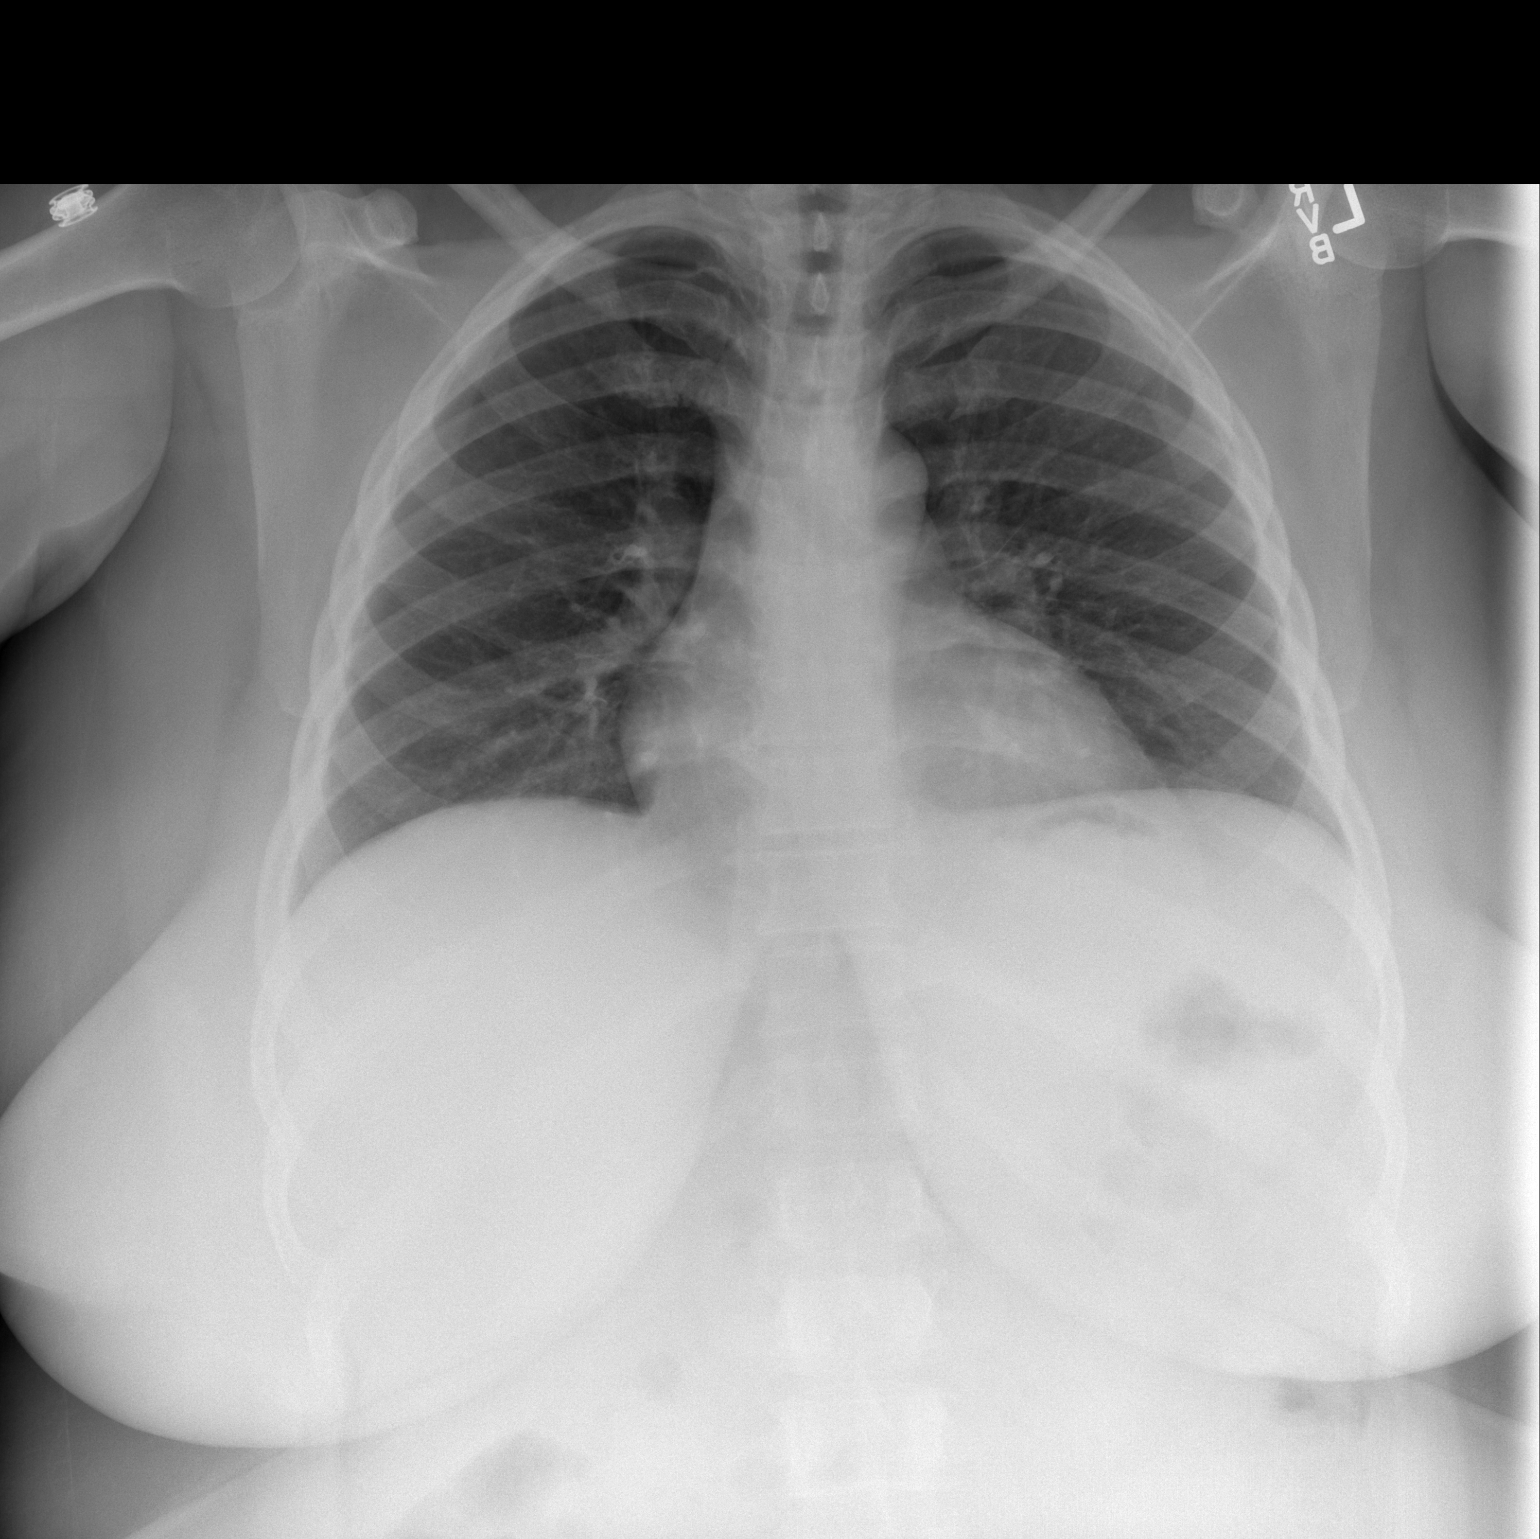

[w chest lat *]
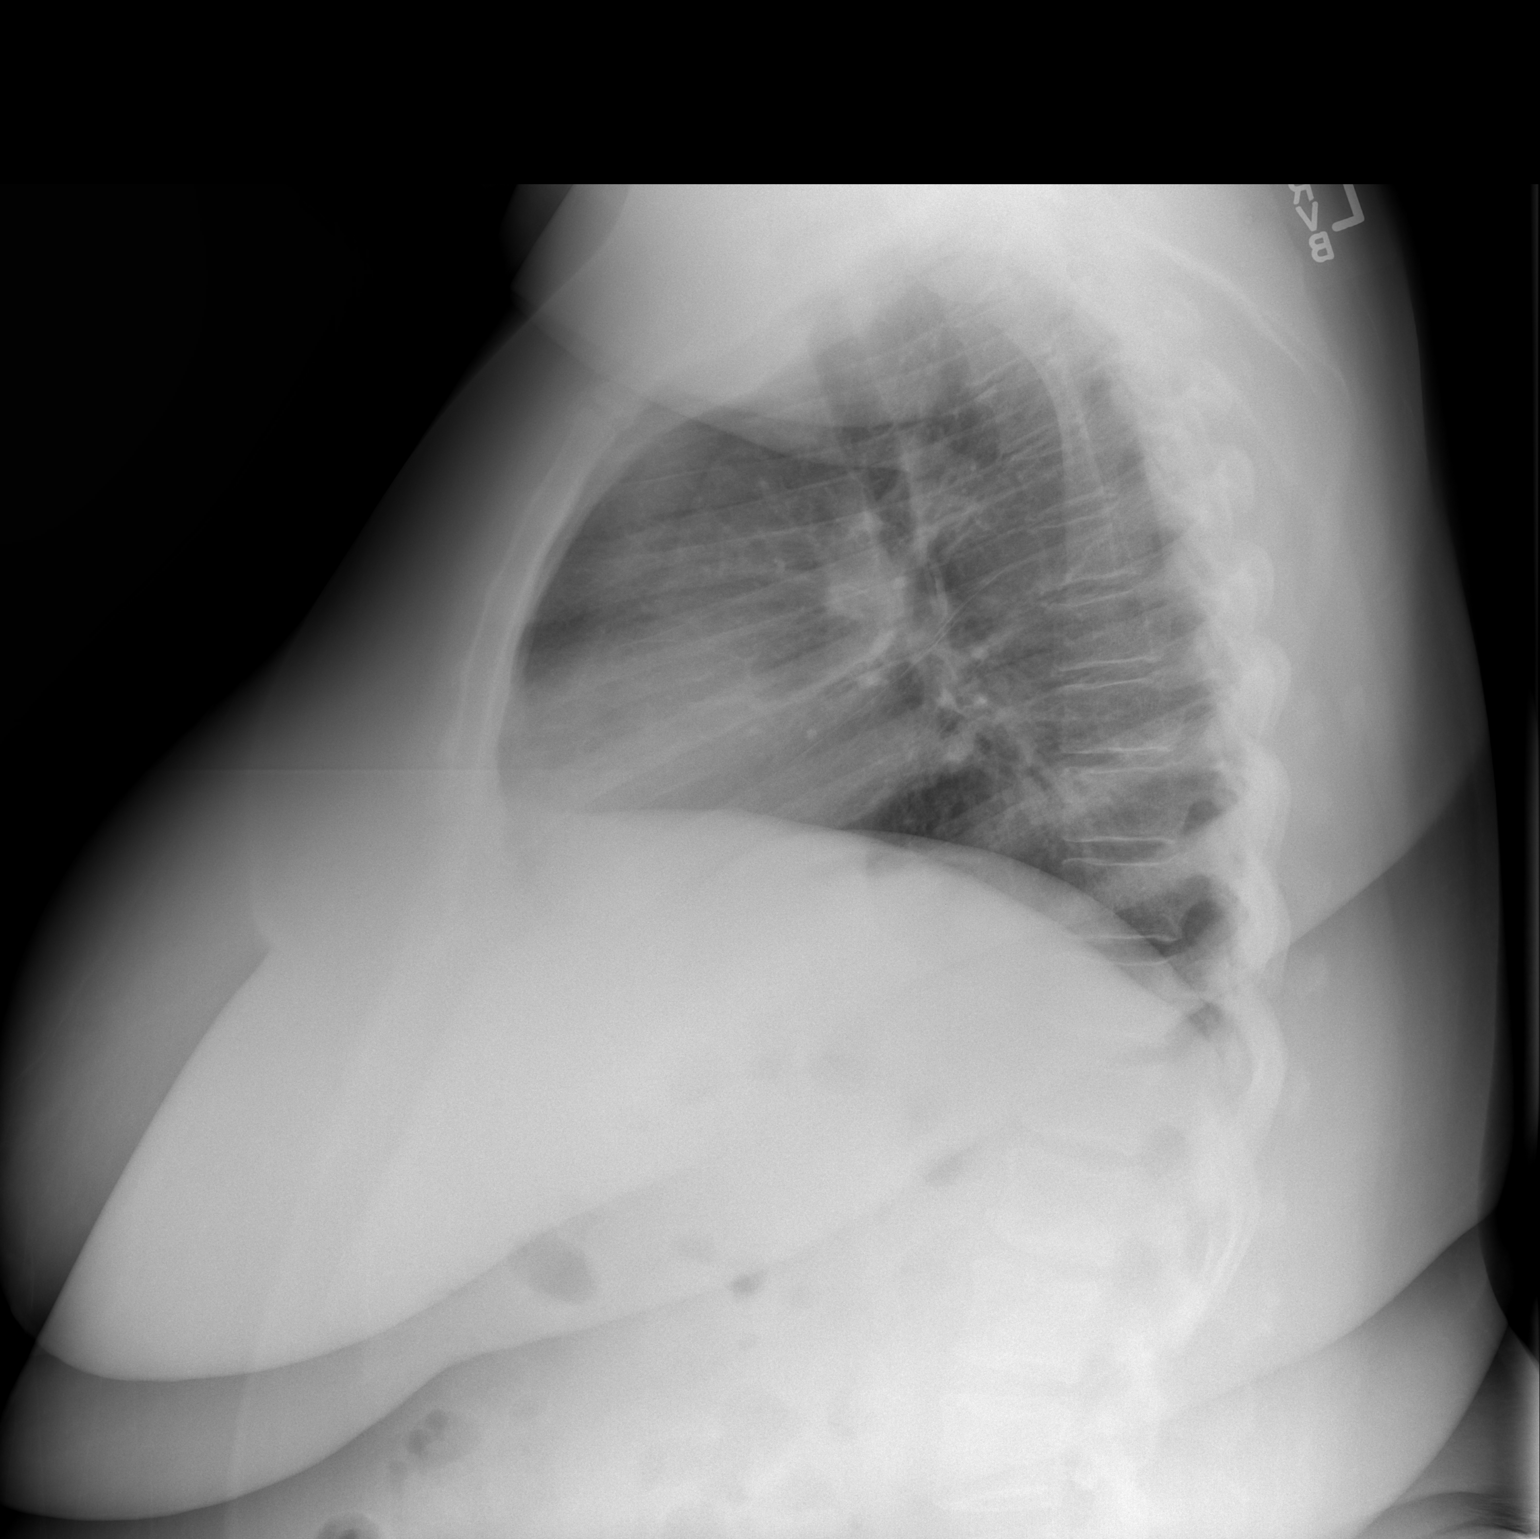

[2 of 2 positions shown; findings below may reference images not displayed]

FINDINGS: Lungs are clear.  No pleural effusion.  Heart size
normal.
IMPRESSION: Negative chest.

## 2011-03-13 ENCOUNTER — Emergency Department (HOSPITAL_COMMUNITY)
Admission: EM | Admit: 2011-03-13 | Discharge: 2011-03-13 | Disposition: A | Discharge status: Home / Self Care | Payer: Self-pay | Attending: Emergency Medicine | Admitting: Emergency Medicine

## 2011-03-13 ENCOUNTER — Emergency Department (HOSPITAL_COMMUNITY): Payer: Self-pay

## 2011-03-13 DIAGNOSIS — E7800 Pure hypercholesterolemia, unspecified: Secondary | ICD-10-CM | POA: Insufficient documentation

## 2011-03-13 DIAGNOSIS — M76899 Other specified enthesopathies of unspecified lower limb, excluding foot: Secondary | ICD-10-CM | POA: Insufficient documentation

## 2011-03-13 DIAGNOSIS — I10 Essential (primary) hypertension: Secondary | ICD-10-CM | POA: Insufficient documentation

## 2011-03-13 DIAGNOSIS — M25559 Pain in unspecified hip: Secondary | ICD-10-CM | POA: Insufficient documentation

## 2011-04-26 ENCOUNTER — Emergency Department (HOSPITAL_COMMUNITY)
Admission: EM | Admit: 2011-04-26 | Discharge: 2011-04-26 | Disposition: A | Discharge status: Home / Self Care | Payer: Self-pay | Attending: Emergency Medicine | Admitting: Emergency Medicine

## 2011-04-26 DIAGNOSIS — E7800 Pure hypercholesterolemia, unspecified: Secondary | ICD-10-CM | POA: Insufficient documentation

## 2011-04-26 DIAGNOSIS — J329 Chronic sinusitis, unspecified: Secondary | ICD-10-CM | POA: Insufficient documentation

## 2011-04-26 DIAGNOSIS — J3489 Other specified disorders of nose and nasal sinuses: Secondary | ICD-10-CM | POA: Insufficient documentation

## 2011-04-26 DIAGNOSIS — Z8673 Personal history of transient ischemic attack (TIA), and cerebral infarction without residual deficits: Secondary | ICD-10-CM | POA: Insufficient documentation

## 2011-04-26 DIAGNOSIS — I10 Essential (primary) hypertension: Secondary | ICD-10-CM | POA: Insufficient documentation

## 2011-05-28 ENCOUNTER — Emergency Department (HOSPITAL_COMMUNITY): Payer: Self-pay

## 2011-05-28 ENCOUNTER — Emergency Department (HOSPITAL_COMMUNITY)
Admission: EM | Admit: 2011-05-28 | Discharge: 2011-05-28 | Disposition: A | Discharge status: Home / Self Care | Payer: Self-pay | Attending: Emergency Medicine | Admitting: Emergency Medicine

## 2011-05-28 DIAGNOSIS — I10 Essential (primary) hypertension: Secondary | ICD-10-CM | POA: Insufficient documentation

## 2011-05-28 DIAGNOSIS — M25569 Pain in unspecified knee: Secondary | ICD-10-CM | POA: Insufficient documentation

## 2011-05-28 DIAGNOSIS — S8990XA Unspecified injury of unspecified lower leg, initial encounter: Secondary | ICD-10-CM | POA: Insufficient documentation

## 2011-05-28 DIAGNOSIS — Z8673 Personal history of transient ischemic attack (TIA), and cerebral infarction without residual deficits: Secondary | ICD-10-CM | POA: Insufficient documentation

## 2011-05-28 DIAGNOSIS — R296 Repeated falls: Secondary | ICD-10-CM | POA: Insufficient documentation

## 2011-05-28 DIAGNOSIS — S99929A Unspecified injury of unspecified foot, initial encounter: Secondary | ICD-10-CM | POA: Insufficient documentation

## 2011-07-01 LAB — CBC
HCT: 39.3
Hemoglobin: 12.9
MCHC: 32.7
MCV: 87.1
Platelets: 308
RBC: 4.52
RDW: 15.3
WBC: 11.5 — ABNORMAL HIGH

## 2011-07-01 LAB — POCT CARDIAC MARKERS
CKMB, poc: <1 — ABNORMAL LOW
Myoglobin, poc: 51.3
Troponin i, poc: <0.05

## 2011-07-01 LAB — DIFFERENTIAL
Basophils Absolute: 0
Basophils Relative: 0
Eosinophils Absolute: 0.1
Eosinophils Relative: 1
Lymphocytes Relative: 35
Lymphs Abs: 4
Monocytes Absolute: 0.9
Monocytes Relative: 8
Neutro Abs: 6.5
Neutrophils Relative %: 56

## 2011-07-01 LAB — URINALYSIS, ROUTINE W REFLEX MICROSCOPIC
Bilirubin Urine: NEGATIVE
Glucose, UA: NEGATIVE
Hgb urine dipstick: NEGATIVE
Ketones, ur: NEGATIVE
Nitrite: NEGATIVE
Protein, ur: NEGATIVE
Specific Gravity, Urine: 1.013
Urobilinogen, UA: 1
pH: 7

## 2011-07-01 LAB — COMPREHENSIVE METABOLIC PANEL
ALT: 23
AST: 18
Albumin: 3.4 — ABNORMAL LOW
Alkaline Phosphatase: 78
BUN: 10
CO2: 27
Calcium: 9.1
Chloride: 106
Creatinine, Ser: 0.88
GFR calc Af Amer: >60
GFR calc non Af Amer: >60
Glucose, Bld: 94
Potassium: 3.9
Sodium: 138
Total Bilirubin: 0.4
Total Protein: 6.9

## 2011-07-01 LAB — PROTIME-INR
INR: 0.9
Prothrombin Time: 12.4

## 2011-07-01 LAB — URINE CULTURE: Colony Count: >=100000

## 2011-07-02 LAB — CARDIAC PANEL(CRET KIN+CKTOT+MB+TROPI)
CK, MB: 1.5
Relative Index: 1.5
Total CK: 100
Troponin I: 0.02

## 2011-07-02 LAB — BASIC METABOLIC PANEL
BUN: 7
CO2: 26
Calcium: 8.7
Chloride: 109
Creatinine, Ser: 0.79
GFR calc Af Amer: >60
GFR calc non Af Amer: >60
Glucose, Bld: 95
Potassium: 3.9
Sodium: 140

## 2011-07-02 LAB — CBC
HCT: 37.1
Hemoglobin: 12.3
MCHC: 33.1
MCV: 88.4
Platelets: 278
RBC: 4.19
RDW: 15.4
WBC: 9.8

## 2011-07-13 ENCOUNTER — Emergency Department (HOSPITAL_COMMUNITY): Payer: Self-pay

## 2011-07-13 ENCOUNTER — Emergency Department (HOSPITAL_COMMUNITY)
Admission: EM | Admit: 2011-07-13 | Discharge: 2011-07-14 | Disposition: A | Discharge status: Home / Self Care | Payer: Self-pay | Attending: Emergency Medicine | Admitting: Emergency Medicine

## 2011-07-13 DIAGNOSIS — W010XXA Fall on same level from slipping, tripping and stumbling without subsequent striking against object, initial encounter: Secondary | ICD-10-CM | POA: Insufficient documentation

## 2011-07-13 DIAGNOSIS — M25559 Pain in unspecified hip: Secondary | ICD-10-CM | POA: Insufficient documentation

## 2011-07-13 DIAGNOSIS — S8990XA Unspecified injury of unspecified lower leg, initial encounter: Secondary | ICD-10-CM | POA: Insufficient documentation

## 2011-07-13 DIAGNOSIS — Y92009 Unspecified place in unspecified non-institutional (private) residence as the place of occurrence of the external cause: Secondary | ICD-10-CM | POA: Insufficient documentation

## 2011-07-13 DIAGNOSIS — I10 Essential (primary) hypertension: Secondary | ICD-10-CM | POA: Insufficient documentation

## 2011-07-13 DIAGNOSIS — Z79899 Other long term (current) drug therapy: Secondary | ICD-10-CM | POA: Insufficient documentation

## 2011-07-13 DIAGNOSIS — M25569 Pain in unspecified knee: Secondary | ICD-10-CM | POA: Insufficient documentation

## 2011-07-13 DIAGNOSIS — E7800 Pure hypercholesterolemia, unspecified: Secondary | ICD-10-CM | POA: Insufficient documentation

## 2011-07-13 DIAGNOSIS — Z8673 Personal history of transient ischemic attack (TIA), and cerebral infarction without residual deficits: Secondary | ICD-10-CM | POA: Insufficient documentation

## 2011-08-23 ENCOUNTER — Emergency Department (HOSPITAL_COMMUNITY)
Admission: EM | Admit: 2011-08-23 | Discharge: 2011-08-23 | Disposition: A | Discharge status: Home / Self Care | Payer: Self-pay | Attending: Emergency Medicine | Admitting: Emergency Medicine

## 2011-08-23 ENCOUNTER — Encounter: Payer: Self-pay | Admitting: *Deleted

## 2011-08-23 DIAGNOSIS — J329 Chronic sinusitis, unspecified: Secondary | ICD-10-CM | POA: Insufficient documentation

## 2011-08-23 DIAGNOSIS — I10 Essential (primary) hypertension: Secondary | ICD-10-CM | POA: Insufficient documentation

## 2011-08-23 HISTORY — DX: Essential (primary) hypertension: I10

## 2011-08-23 HISTORY — DX: Pure hypercholesterolemia, unspecified: E78.00

## 2011-08-23 MED ORDER — METOPROLOL TARTRATE 25 MG PO TABS
50.0000 mg | ORAL_TABLET | Freq: Once | ORAL | Status: AC
  Administered 2011-08-23: 50 mg via ORAL
  Filled 2011-08-23: qty 2

## 2011-08-23 MED ORDER — AZITHROMYCIN 250 MG PO TABS
500.0000 mg | ORAL_TABLET | Freq: Once | ORAL | Status: AC
  Administered 2011-08-23: 500 mg via ORAL
  Filled 2011-08-23: qty 2

## 2011-08-23 MED ORDER — DIPHENHYDRAMINE HCL 25 MG PO CAPS
25.0000 mg | ORAL_CAPSULE | Freq: Once | ORAL | Status: AC
  Administered 2011-08-23: 25 mg via ORAL
  Filled 2011-08-23: qty 1

## 2011-08-23 MED ORDER — AZITHROMYCIN 250 MG PO TABS: 250.0000 mg | ORAL_TABLET | Freq: Every day | ORAL | Status: AC

## 2011-08-23 MED ORDER — METOPROLOL TARTRATE 25 MG PO TABS: 25.0000 mg | ORAL_TABLET | Freq: Once | ORAL | Status: DC

## 2011-08-23 MED ORDER — LISINOPRIL 20 MG PO TABS
20.0000 mg | ORAL_TABLET | Freq: Once | ORAL | Status: AC
  Administered 2011-08-23: 20 mg via ORAL
  Filled 2011-08-23 (×2): qty 1

## 2011-08-23 MED ORDER — LISINOPRIL 10 MG PO TABS: 10.0000 mg | ORAL_TABLET | Freq: Once | ORAL | Status: DC

## 2011-08-23 MED ORDER — ACETAMINOPHEN 500 MG PO TABS
1000.0000 mg | ORAL_TABLET | Freq: Once | ORAL | Status: AC
  Administered 2011-08-23: 1000 mg via ORAL
  Filled 2011-08-23: qty 2

## 2011-08-23 NOTE — ED Provider Notes (Signed)
Medical screening examination/treatment/procedure(s) were performed by non-physician practitioner and as supervising physician I was immediately available for consultation/collaboration.  Olivia Mackie, MD 08/23/11 [phone]

## 2011-08-23 NOTE — ED Notes (Signed)
Pt c/o sinus congestion, sore throat, and chest congestion x 3 weeks. Pt presents w/ hypertension.

## 2011-08-23 NOTE — ED Notes (Signed)
Sent pharmacy a note to send up the lisinopril

## 2011-08-23 NOTE — ED Notes (Signed)
Pt verbalized understanding of discharge instructions. amb indep to discharge windwo

## 2011-08-23 NOTE — ED Provider Notes (Signed)
History     CSN: [id number] Arrival date & time: 08/23/2011  5:01 AM   First MD Initiated Contact with Patient 08/23/11 [phone]      Chief Complaint  Patient presents with  . Sore Throat  . Hypertension  . Nasal Congestion    (Consider location/radiation/quality/duration/timing/severity/associated sxs/prior treatment) HPI  Patient presents to emergency department complaining of a 3 week history of nasal congestion, sore throat, and cough that she states temporarily improved on its own using over-the-counter cough and cold medication but over the last one and half weeks symptoms gradually returned and have worsened. Patient is complaining of ongoing sore throat and sinus congestion. Patient notes that she took sinus decongestants with her last dose 2 days ago. Upon arrival to the ER her blood pressure was noted to be elevated however patient states she has been taking her blood pressure medicines as directed but has not had her morning blood pressure medicine that she normally takes at 6 AM. Patient has no complaints of chest pain, shortness of breath, urinary difficulties or changes, headache, or dizziness. Patient denies any known fever but states she's felt hot and cold. Patient states her sore throat is aggravated by swallowing and cold air. Patient states mild temporary relief with over-the-counter cough and cold suppressants.  Past Medical History  Diagnosis Date  . Hypertension   . Hypercholesteremia   . Migraine     Past Surgical History  Procedure Date  . Abdominal hysterectomy   . Knee arthroscopy     History reviewed. No pertinent family history.  History  Substance Use Topics  . Smoking status: Never Smoker   . Smokeless tobacco: Not on file  . Alcohol Use: No    OB History    Grav Para Term Preterm Abortions TAB SAB Ect Mult Living                  Review of Systems  All other systems reviewed and are negative.    Allergies  Naproxen  Home Medications    Current Outpatient Rx  Name Route Sig Dispense Refill  . AMLODIPINE BESYLATE 5 MG PO TABS Oral Take 5 mg by mouth daily.      . ASPIRIN 81 MG PO CHEW Oral Chew 81 mg by mouth daily.      Marland Kitchen LISINOPRIL-HYDROCHLOROTHIAZIDE 20-25 MG PO TABS Oral Take 1 tablet by mouth daily.      Marland Kitchen METOPROLOL TARTRATE 50 MG PO TABS Oral Take 50 mg by mouth 2 (two) times daily.      Marland Kitchen SIMVASTATIN 20 MG PO TABS Oral Take 20 mg by mouth at bedtime.        BP 214/102  Pulse 85  Temp(Src) 98.6 F (37 C) (Oral)  Resp 20  SpO2 100%  Physical Exam  Vitals reviewed. Constitutional: She is oriented to person, place, and time. She appears well-developed and well-nourished. No distress.       Morbidly obese  HENT:  Head: Normocephalic and atraumatic.  Right Ear: External ear normal.  Left Ear: External ear normal.  Nose: Nose normal.  Mouth/Throat: No oropharyngeal exudate.       Mild erythema of posterior pharynx and tonsils no tonsillar exudate or enlargement. Patent airway. Swallowing secretions well  Eyes: Conjunctivae and EOM are normal. Pupils are equal, round, and reactive to light.  Neck: Normal range of motion. Neck supple.  Cardiovascular: Normal rate, regular rhythm and normal heart sounds.  Exam reveals no gallop and no friction rub.  No murmur heard. Pulmonary/Chest: Effort normal and breath sounds normal. No respiratory distress. She has no wheezes. She has no rales. She exhibits no tenderness.  Abdominal: Soft. She exhibits no distension and no mass. There is no tenderness. There is no rebound and no guarding.  Lymphadenopathy:    She has no cervical adenopathy.  Neurological: She is alert and oriented to person, place, and time. She has normal reflexes.  Skin: Skin is warm and dry. No rash noted. She is not diaphoretic.  Psychiatric: She has a normal mood and affect.    ED Course  Procedures (including critical care time)  By mouth lisinopril and metoprolol. By mouth Benadryl and  Tylenol.  Labs Reviewed - No data to display No results found.   1. Sinusitis   2. Hypertension       MDM  Patient is afebrile and nontoxic-appearing. She has signs symptoms of sinusitis. Patient had no complaints of blood pressure or related symptoms upon arrival to the ER and her blood pressure is significantly decreased when given her morning blood pressure medicine. Patient had been using decongestants in the last 72 hours which likely contributed to her elevated blood pressure upon arrival to the ER as well as the fact that she was due for her morning blood pressure medicines. Patient denies any chest pain, shortness of breath, dizziness, or urinary changes. Patient has primary care doctor that she can followup with for recheck of blood pressure.        Lenon Oms [place], [place] 08/23/11 [phone]

## 2011-09-22 ENCOUNTER — Emergency Department (HOSPITAL_COMMUNITY)
Admission: EM | Admit: 2011-09-22 | Discharge: 2011-09-23 | Disposition: A | Discharge status: Home / Self Care | Payer: Self-pay | Attending: Emergency Medicine | Admitting: Emergency Medicine

## 2011-09-22 ENCOUNTER — Encounter (HOSPITAL_COMMUNITY): Payer: Self-pay | Admitting: *Deleted

## 2011-09-22 ENCOUNTER — Emergency Department (HOSPITAL_COMMUNITY): Payer: Self-pay

## 2011-09-22 ENCOUNTER — Other Ambulatory Visit: Payer: Self-pay

## 2011-09-22 DIAGNOSIS — R10819 Abdominal tenderness, unspecified site: Secondary | ICD-10-CM | POA: Insufficient documentation

## 2011-09-22 DIAGNOSIS — F411 Generalized anxiety disorder: Secondary | ICD-10-CM | POA: Insufficient documentation

## 2011-09-22 DIAGNOSIS — E041 Nontoxic single thyroid nodule: Secondary | ICD-10-CM | POA: Insufficient documentation

## 2011-09-22 DIAGNOSIS — I10 Essential (primary) hypertension: Secondary | ICD-10-CM | POA: Insufficient documentation

## 2011-09-22 DIAGNOSIS — E7800 Pure hypercholesterolemia, unspecified: Secondary | ICD-10-CM | POA: Insufficient documentation

## 2011-09-22 DIAGNOSIS — M549 Dorsalgia, unspecified: Secondary | ICD-10-CM | POA: Insufficient documentation

## 2011-09-22 DIAGNOSIS — R109 Unspecified abdominal pain: Secondary | ICD-10-CM | POA: Insufficient documentation

## 2011-09-22 DIAGNOSIS — Z79899 Other long term (current) drug therapy: Secondary | ICD-10-CM | POA: Insufficient documentation

## 2011-09-22 DIAGNOSIS — R071 Chest pain on breathing: Secondary | ICD-10-CM | POA: Insufficient documentation

## 2011-09-22 DIAGNOSIS — K8020 Calculus of gallbladder without cholecystitis without obstruction: Secondary | ICD-10-CM | POA: Insufficient documentation

## 2011-09-22 DIAGNOSIS — Z7982 Long term (current) use of aspirin: Secondary | ICD-10-CM | POA: Insufficient documentation

## 2011-09-22 LAB — URINALYSIS, ROUTINE W REFLEX MICROSCOPIC
Bilirubin Urine: NEGATIVE
Glucose, UA: NEGATIVE mg/dL
Hgb urine dipstick: NEGATIVE
Ketones, ur: NEGATIVE mg/dL
Leukocytes, UA: NEGATIVE
Nitrite: NEGATIVE
Protein, ur: NEGATIVE mg/dL
Specific Gravity, Urine: 1.019 (ref 1.005–1.030)
Urobilinogen, UA: 1 mg/dL (ref 0.0–1.0)
pH: 7.5 (ref 5.0–8.0)

## 2011-09-22 LAB — POCT I-STAT, CHEM 8
BUN: 13 mg/dL (ref 6–23)
Calcium, Ion: 1.14 mmol/L (ref 1.12–1.32)
Chloride: 106 mEq/L (ref 96–112)
Creatinine, Ser: 0.9 mg/dL (ref 0.50–1.10)
Glucose, Bld: 153 mg/dL — ABNORMAL HIGH (ref 70–99)
HCT: 44 % (ref 36.0–46.0)
Hemoglobin: 15 g/dL (ref 12.0–15.0)
Potassium: 3.7 mEq/L (ref 3.5–5.1)
Sodium: 140 mEq/L (ref 135–145)
TCO2: 25 mmol/L (ref 0–100)

## 2011-09-22 LAB — CBC
HCT: 41.8 % (ref 36.0–46.0)
Hemoglobin: 13.8 g/dL (ref 12.0–15.0)
MCH: 28.1 pg (ref 26.0–34.0)
MCHC: 33 g/dL (ref 30.0–36.0)
MCV: 85.1 fL (ref 78.0–100.0)
Platelets: 316 10*3/uL (ref 150–400)
RBC: 4.91 MIL/uL (ref 3.87–5.11)
RDW: 14.6 % (ref 11.5–15.5)
WBC: 14.5 10*3/uL — ABNORMAL HIGH (ref 4.0–10.5)

## 2011-09-22 LAB — HEPATIC FUNCTION PANEL
ALT: 16 U/L (ref 0–35)
AST: 16 U/L (ref 0–37)
Albumin: 3.7 g/dL (ref 3.5–5.2)
Alkaline Phosphatase: 95 U/L (ref 39–117)
Bilirubin, Direct: <0.1 mg/dL (ref 0.0–0.3)
Total Bilirubin: 0.2 mg/dL — ABNORMAL LOW (ref 0.3–1.2)
Total Protein: 7.9 g/dL (ref 6.0–8.3)

## 2011-09-22 LAB — LIPASE, BLOOD: Lipase: 43 U/L (ref 11–59)

## 2011-09-22 LAB — DIFFERENTIAL
Basophils Absolute: 0 10*3/uL (ref 0.0–0.1)
Basophils Relative: 0 % (ref 0–1)
Eosinophils Absolute: 0.1 10*3/uL (ref 0.0–0.7)
Eosinophils Relative: 1 % (ref 0–5)
Lymphocytes Relative: 39 % (ref 12–46)
Lymphs Abs: 5.6 10*3/uL — ABNORMAL HIGH (ref 0.7–4.0)
Monocytes Absolute: 1 10*3/uL (ref 0.1–1.0)
Monocytes Relative: 7 % (ref 3–12)
Neutro Abs: 7.7 10*3/uL (ref 1.7–7.7)
Neutrophils Relative %: 53 % (ref 43–77)

## 2011-09-22 LAB — POCT I-STAT TROPONIN I: Troponin i, poc: 0 ng/mL (ref 0.00–0.08)

## 2011-09-22 MED ORDER — HYDROMORPHONE HCL PF 1 MG/ML IJ SOLN
1.0000 mg | Freq: Once | INTRAMUSCULAR | Status: AC
  Administered 2011-09-22: 1 mg via INTRAVENOUS
  Filled 2011-09-22: qty 1

## 2011-09-22 MED ORDER — METOCLOPRAMIDE HCL 10 MG PO TABS: 10.0000 mg | ORAL_TABLET | Freq: Four times a day (QID) | ORAL | Status: AC | PRN

## 2011-09-22 MED ORDER — SODIUM CHLORIDE 0.9 % IV SOLN: INTRAVENOUS | Status: DC

## 2011-09-22 MED ORDER — SODIUM CHLORIDE 0.9 % IV BOLUS (SEPSIS)
1000.0000 mL | Freq: Once | INTRAVENOUS | Status: AC
  Administered 2011-09-22: 1000 mL via INTRAVENOUS

## 2011-09-22 MED ORDER — IOHEXOL 350 MG/ML SOLN
100.0000 mL | Freq: Once | INTRAVENOUS | Status: AC | PRN
  Administered 2011-09-22: 100 mL via INTRAVENOUS

## 2011-09-22 MED ORDER — OXYCODONE-ACETAMINOPHEN 5-325 MG PO TABS: 2.0000 | ORAL_TABLET | ORAL | Status: AC | PRN

## 2011-09-22 NOTE — ED Notes (Signed)
OLD AND NEW EKG GIVEN TO DR Gwendolyn Grant

## 2011-09-22 NOTE — ED Provider Notes (Signed)
History     CSN: [id number]  Arrival date & time 09/22/11  9147   First MD Initiated Contact with Patient 09/22/11 2103      Chief Complaint  Patient presents with  . Back Pain    (Consider location/radiation/quality/duration/timing/severity/associated sxs/prior treatment) HPI This 50 year old female has a gradual onset about 3 hours now of waxing and waning severe pain in her entire back chest and abdomen with no associated symptoms and no trauma with a gradual onset over the first hour and a half of the pain reaching its severe intensity the pain is worse with position and palpation and not colicky with her examination is significant for diffuse tenderness to her chest wall upper abdomen and back without peritonitis, her back pain radiates to her entire chest and abdomen with a gradual onset sharp quality constant duration moderately severe without associated symptoms were prior treatment.  Past Medical History  Diagnosis Date  . Hypertension   . Hypercholesteremia   . Migraine     Past Surgical History  Procedure Date  . Abdominal hysterectomy   . Knee arthroscopy     History reviewed. No pertinent family history.  History  Substance Use Topics  . Smoking status: Never Smoker   . Smokeless tobacco: Not on file  . Alcohol Use: No    OB History    Grav Para Term Preterm Abortions TAB SAB Ect Mult Living                  Review of Systems  Constitutional: Negative for fever.       10 Systems reviewed and are negative for acute change except as noted in the HPI.  HENT: Negative for congestion.   Eyes: Negative for discharge and redness.  Respiratory: Negative for cough and shortness of breath.   Cardiovascular: Positive for chest pain.  Gastrointestinal: Positive for abdominal pain. Negative for vomiting.  Genitourinary: Negative for dysuria.  Musculoskeletal: Negative for back pain.  Skin: Negative for rash.  Neurological: Negative for syncope, numbness and  headaches.  Psychiatric/Behavioral:       No behavior change.    Allergies  Naproxen  Home Medications   Current Outpatient Rx  Name Route Sig Dispense Refill  . AMLODIPINE BESYLATE 5 MG PO TABS Oral Take 5 mg by mouth daily.      . ASPIRIN 81 MG PO CHEW Oral Chew 81 mg by mouth daily.      Marland Kitchen LISINOPRIL-HYDROCHLOROTHIAZIDE 20-25 MG PO TABS Oral Take 1 tablet by mouth daily.      Marland Kitchen METOPROLOL TARTRATE 50 MG PO TABS Oral Take 50 mg by mouth 2 (two) times daily.      Marland Kitchen SIMVASTATIN 20 MG PO TABS Oral Take 20 mg by mouth at bedtime.      Marland Kitchen METOCLOPRAMIDE HCL 10 MG PO TABS Oral Take 1 tablet (10 mg total) by mouth every 6 (six) hours as needed (nausea/headache). 6 tablet 0  . OXYCODONE-ACETAMINOPHEN 5-325 MG PO TABS Oral Take 2 tablets by mouth every 4 (four) hours as needed for pain. 20 tablet 0    BP 137/90  Pulse 97  Temp(Src) 98.9 F (37.2 C) (Oral)  Resp 17  Ht 4\' 11"  (1.499 m)  Wt 280 lb (127.007 kg)  BMI 56.55 kg/m2  SpO2 98%  Physical Exam  Nursing note and vitals reviewed. Constitutional:       Awake, alert, nontoxic appearance.  HENT:  Head: Atraumatic.  Eyes: Right eye exhibits no discharge. Left eye  exhibits no discharge.  Neck: Neck supple.  Cardiovascular: Normal rate and regular rhythm.   No murmur heard. Pulmonary/Chest: Effort normal and breath sounds normal. No respiratory distress. She has no wheezes. She has no rales. She exhibits tenderness.  Abdominal: Soft. Bowel sounds are normal. She exhibits no mass. There is tenderness. There is no rebound and no guarding.       Diffuse mild tenderness of the upper abdomen  Musculoskeletal: She exhibits no edema and no tenderness.       Baseline ROM, no obvious new focal weakness. Arms and legs nontender back is diffusely tender to palpation worse with movement of the torso in the ED; intact and symmetric radial and dorsalis pedis pulses with capillary refill less than 2 seconds and normal light touch in all 4  extremities  Neurological: She is alert.       Mental status and motor strength appears baseline for patient and situation.  Skin: No rash noted.  Psychiatric:       Anxious    ED Course  Procedures (including critical care time) This 50 year old female has a gradual onset about 3 hours now of waxing and waning severe pain in her entire back chest and abdomen with no associated symptoms and no trauma with a gradual onset over the first hour and a half of the pain reaching its severe intensity the pain is worse with position and palpation and not colicky with her examination is significant for diffuse tenderness to her chest wall upper abdomen and back without peritonitis  ECG: Sinus rhythm, ventricular rate 97, normal axis, double intervals, nonspecific T wave changes, no significant change noted from comparison October 2011 Labs Reviewed  CBC - Abnormal; Notable for the following:    WBC 14.5 (*)    All other components within normal limits  DIFFERENTIAL - Abnormal; Notable for the following:    Lymphs Abs 5.6 (*)    All other components within normal limits  HEPATIC FUNCTION PANEL - Abnormal; Notable for the following:    Total Bilirubin 0.2 (*)    All other components within normal limits  POCT I-STAT, CHEM 8 - Abnormal; Notable for the following:    Glucose, Bld 153 (*)    All other components within normal limits  URINALYSIS, ROUTINE W REFLEX MICROSCOPIC  LIPASE, BLOOD  POCT I-STAT TROPONIN I  I-STAT TROPONIN I  I-STAT, CHEM 8   Ct Angio Chest W/cm &/or Wo Cm  09/22/2011  *RADIOLOGY REPORT*  Clinical Data:  Chest pain.  Dissection.  Back pain.  CT ANGIOGRAPHY CHEST, ABDOMEN AND PELVIS  Technique:  Multidetector CT imaging through the chest, abdomen and pelvis was performed using the standard protocol during bolus administration of intravenous contrast.  Multiplanar reconstructed images including MIPs were obtained and reviewed to evaluate the vascular anatomy.  Contrast:  OMNIPAQUE IOHEXOL 350 MG/ML IV SOLN  Comparison:  07/08/2010 chest radiograph.  CTA CHEST  Findings:  The aorta appears normal.  There is no evidence of intramural hematoma.  No aortic dissection.  The heart is normal. Mediastinal lipomatosis.  There is no axillary or hilar adenopathy.  No mediastinal adenopathy.  No pericardial or pleural effusion.  Dependent atelectasis is present in the lungs.  There is no airspace disease. The bones appear within normal limits. No central pulmonary embolus identified.  Arterial bolus timing was performed.  Nonspecific right thyroid lobe lesion measures 27 mm.  Follow-up thyroid ultrasound is recommended.   Review of the MIP images confirms the  above findings.  IMPRESSION: 1.  Negative CTA chest. 2.  Nonspecific 27 mm exophytic right thyroid lobe nodule.  Follow- up nonemergent thyroid ultrasound recommended.  CTA ABDOMEN AND PELVIS  Findings:  Liver appears within normal limits.  Cholelithiasis is present with largest gallstone in the gallbladder neck measuring 27 mm.  No calcified gallstone is identified, common bile duct.  No intrahepatic biliary ductal dilation.  No gross inflammatory changes of the gallbladder by CT.  The kidneys demonstrate bilateral renal cystic lesions consistent with simple cysts. Delayed images were not performed.  The adrenal glands appear normal.  Small and large bowel appear within normal limits.  There is no free air or free fluid.  The abdominal aorta and visceral vasculature appears normal and widely patent.  Inferior mesenteric artery is normal.  Normal appendix is identified in the right lower quadrant.  Scar is present in the midline of the abdominal wall inferior to the umbilicus.  Urinary bladder appears normal.  Calcified phleboliths in the anatomic pelvis.  Hysterectomy.  Mild colonic diverticulosis without diverticulitis.  Iliofemoral system appears normal.  No inguinal adenopathy.  There are no aggressive osseous lesions.  Lumbar and  thoracic spinal alignment is anatomic. Fat containing umbilical hernia is present.   Review of the MIP images confirms the above findings.  IMPRESSION: 1.  Negative for acute vascular abnormality. 2.  Cholelithiasis.  No CT evidence of acute cholecystitis. 3.  Bilateral renal cysts. 4.  Hysterectomy with midline abdominal scar.  Original Report Authenticated By: Andreas Newport, M.D.   Dg Chest Port 1 View  09/22/2011  *RADIOLOGY REPORT*  Clinical Data: Back pain.  Chest pain.  Hypertension.  PORTABLE CHEST - 1 VIEW  Comparison: CT day 09/22/2011.  Findings: Low volume chest with bilateral basilar atelectasis. Cardiopericardial silhouette and mediastinal contours normal.  No airspace disease or effusion.  IMPRESSION: Low volume chest.  Original Report Authenticated By: Andreas Newport, M.D.   Ct Angio Abd/pel W/ And/or W/o  09/22/2011  *RADIOLOGY REPORT*  Clinical Data:  Chest pain.  Dissection.  Back pain.  CT ANGIOGRAPHY CHEST, ABDOMEN AND PELVIS  Technique:  Multidetector CT imaging through the chest, abdomen and pelvis was performed using the standard protocol during bolus administration of intravenous contrast.  Multiplanar reconstructed images including MIPs were obtained and reviewed to evaluate the vascular anatomy.  Contrast: OMNIPAQUE IOHEXOL 350 MG/ML IV SOLN  Comparison:  07/08/2010 chest radiograph.  CTA CHEST  Findings:  The aorta appears normal.  There is no evidence of intramural hematoma.  No aortic dissection.  The heart is normal. Mediastinal lipomatosis.  There is no axillary or hilar adenopathy.  No mediastinal adenopathy.  No pericardial or pleural effusion.  Dependent atelectasis is present in the lungs.  There is no airspace disease. The bones appear within normal limits. No central pulmonary embolus identified.  Arterial bolus timing was performed.  Nonspecific right thyroid lobe lesion measures 27 mm.  Follow-up thyroid ultrasound is recommended.   Review of the MIP images  confirms the above findings.  IMPRESSION: 1.  Negative CTA chest. 2.  Nonspecific 27 mm exophytic right thyroid lobe nodule.  Follow- up nonemergent thyroid ultrasound recommended.  CTA ABDOMEN AND PELVIS  Findings:  Liver appears within normal limits.  Cholelithiasis is present with largest gallstone in the gallbladder neck measuring 27 mm.  No calcified gallstone is identified, common bile duct.  No intrahepatic biliary ductal dilation.  No gross inflammatory changes of the gallbladder by CT.  The kidneys demonstrate  bilateral renal cystic lesions consistent with simple cysts. Delayed images were not performed.  The adrenal glands appear normal.  Small and large bowel appear within normal limits.  There is no free air or free fluid.  The abdominal aorta and visceral vasculature appears normal and widely patent.  Inferior mesenteric artery is normal.  Normal appendix is identified in the right lower quadrant.  Scar is present in the midline of the abdominal wall inferior to the umbilicus.  Urinary bladder appears normal.  Calcified phleboliths in the anatomic pelvis.  Hysterectomy.  Mild colonic diverticulosis without diverticulitis.  Iliofemoral system appears normal.  No inguinal adenopathy.  There are no aggressive osseous lesions.  Lumbar and thoracic spinal alignment is anatomic. Fat containing umbilical hernia is present.   Review of the MIP images confirms the above findings.  IMPRESSION: 1.  Negative for acute vascular abnormality. 2.  Cholelithiasis.  No CT evidence of acute cholecystitis. 3.  Bilateral renal cysts. 4.  Hysterectomy with midline abdominal scar.  Original Report Authenticated By: Andreas Newport, M.D.     1. Back pain   2. Chest pain   3. Abdominal pain   4. Thyroid nodule   5. Cholelithiasis       MDM  Pt feels improved after observation and/or treatment in ED.Patient / Family / Caregiver informed of clinical course, understand medical decision-making process, and agree with  plan.I doubt any other EMC precluding discharge at this time including, but not necessarily limited to the following:ACS, dissection.        Hurman Horn, MD 09/23/11 [phone]

## 2011-09-22 NOTE — ED Notes (Signed)
Patient with history of back pain.  Tonight her back pain was worse.  Denies any injury to herback

## 2011-11-07 ENCOUNTER — Other Ambulatory Visit: Payer: Self-pay | Admitting: Internal Medicine

## 2011-11-07 DIAGNOSIS — E041 Nontoxic single thyroid nodule: Secondary | ICD-10-CM

## 2011-11-13 ENCOUNTER — Ambulatory Visit (HOSPITAL_COMMUNITY)
Admission: RE | Admit: 2011-11-13 | Discharge: 2011-11-13 | Disposition: A | Discharge status: Home / Self Care | Payer: Self-pay | Source: Ambulatory Visit | Attending: Internal Medicine | Admitting: Internal Medicine

## 2011-11-13 DIAGNOSIS — E041 Nontoxic single thyroid nodule: Secondary | ICD-10-CM

## 2011-11-13 DIAGNOSIS — E049 Nontoxic goiter, unspecified: Secondary | ICD-10-CM | POA: Insufficient documentation

## 2011-11-24 ENCOUNTER — Other Ambulatory Visit: Payer: Self-pay | Admitting: Internal Medicine

## 2011-11-24 DIAGNOSIS — E042 Nontoxic multinodular goiter: Secondary | ICD-10-CM

## 2011-11-28 ENCOUNTER — Encounter (INDEPENDENT_AMBULATORY_CARE_PROVIDER_SITE_OTHER): Payer: Self-pay | Admitting: General Surgery

## 2011-12-01 ENCOUNTER — Ambulatory Visit (HOSPITAL_COMMUNITY)
Admission: RE | Admit: 2011-12-01 | Discharge: 2011-12-01 | Disposition: A | Discharge status: Home / Self Care | Payer: Self-pay | Source: Ambulatory Visit | Attending: Internal Medicine | Admitting: Internal Medicine

## 2011-12-01 DIAGNOSIS — E042 Nontoxic multinodular goiter: Secondary | ICD-10-CM | POA: Insufficient documentation

## 2011-12-01 HISTORY — PX: OTHER SURGICAL HISTORY: SHX169

## 2011-12-01 NOTE — Procedures (Signed)
Right middle thyroid nodule fna x 4.  Right inferior thyroid nodule fna x 4  No immediate complications

## 2011-12-04 ENCOUNTER — Other Ambulatory Visit (INDEPENDENT_AMBULATORY_CARE_PROVIDER_SITE_OTHER): Payer: Self-pay | Admitting: Surgery

## 2011-12-04 ENCOUNTER — Telehealth (INDEPENDENT_AMBULATORY_CARE_PROVIDER_SITE_OTHER): Payer: Self-pay | Admitting: Surgery

## 2011-12-04 ENCOUNTER — Encounter (INDEPENDENT_AMBULATORY_CARE_PROVIDER_SITE_OTHER): Payer: Self-pay | Admitting: Surgery

## 2011-12-04 ENCOUNTER — Ambulatory Visit (INDEPENDENT_AMBULATORY_CARE_PROVIDER_SITE_OTHER): Payer: PRIVATE HEALTH INSURANCE | Admitting: Surgery

## 2011-12-04 ENCOUNTER — Telehealth (HOSPITAL_COMMUNITY): Payer: Self-pay | Admitting: *Deleted

## 2011-12-04 VITALS — BP 144/90 | HR 64 | Temp 97.0°F | Resp 20 | Ht 59.0 in | Wt 278.8 lb

## 2011-12-04 DIAGNOSIS — R7303 Prediabetes: Secondary | ICD-10-CM

## 2011-12-04 DIAGNOSIS — R7309 Other abnormal glucose: Secondary | ICD-10-CM

## 2011-12-04 DIAGNOSIS — E041 Nontoxic single thyroid nodule: Secondary | ICD-10-CM

## 2011-12-04 DIAGNOSIS — K8020 Calculus of gallbladder without cholecystitis without obstruction: Secondary | ICD-10-CM

## 2011-12-04 NOTE — Patient Instructions (Signed)
Stay on low fat diet until surgery 

## 2011-12-04 NOTE — Progress Notes (Signed)
Jodi Bowman is referred [facility] for [facility]. In addition she has had a recent thyroid ultrasound guided biopsy for thyroid nodule. Results of that are not back. Her problem list is noted and is fairly significant in that she had a stroke when she was in her early 43s. She has been having right upper quadrant pain with eating particular fatty foods and underwent a workup by [facility] which included an ultrasound showing gallstones. She is on multiple medications as listed on this.  I spoke with her and her husband regarding laparoscopic open cholecystectomy including the potential risk of the operation not limited to, bile duct injury bleeding bile leaks. I gave her the booklet on this procedure. And her questions regarding the surgery. She wants to proceed with scheduling cholecystectomy.  Chief Complaint: gallstones  History of Present Illness:  Jodi Bowman is an 51 y.o. female referred today from [facility] for [facility]. In addition she has had a recent thyroid ultrasound guided biopsy for thyroid nodule. Results of that are not back. Her problem list is noted and is fairly significant in that she had a stroke when she was in her early 24s. She has been having right upper quadrant pain with eating particular fatty foods and underwent a workup [facility] which included an ultrasound showing gallstones. She is on multiple medications as listed on this.  I spoke with her and her husband regarding laparoscopic open cholecystectomy including the potential risk of the operation not limited to, bile duct injury bleeding bile leaks. I gave her the booklet on this procedure. And her questions regarding the surgery. She wants to proceed with scheduling cholecystectomy.  Past Medical History  Diagnosis Date  . Hypertension   . Hypercholesteremia   . Migraine   . Lower back pain   . Nontoxic uninodular goiter   . Calculus of gallbladder without mention of cholecystitis  or obstruction   . Arthritis   . Stroke 2000    Past Surgical History  Procedure Date  . Abdominal hysterectomy   . Knee arthroscopy     Current Outpatient Prescriptions  Medication Sig Dispense Refill  . amLODipine (NORVASC) 5 MG tablet Take 5 mg by mouth daily.        Marland Kitchen aspirin 81 MG chewable tablet Chew 81 mg by mouth daily.        . fluticasone (FLONASE) 50 MCG/ACT nasal spray Place 2 sprays into the nose daily.      Marland Kitchen lisinopril-hydrochlorothiazide (PRINZIDE,ZESTORETIC) 20-25 MG per tablet Take 1 tablet by mouth daily.        . metoprolol succinate (TOPROL-XL) 50 MG 24 hr tablet Take 50 mg by mouth daily. Take with or immediately following a meal.      . pravastatin (PRAVACHOL) 40 MG tablet Take 40 mg by mouth daily.       Naproxen Family History  Problem Relation Age of Onset  . Cancer Brother     colon and lung  . Cancer Maternal Grandmother     colon   Social History:   reports that she has never smoked. She does not have any smokeless tobacco history on file. She reports that she does not drink alcohol or use illicit drugs.   REVIEW OF SYSTEMS - PERTINENT POSITIVES ONLY: See problem list  Physical Exam:   Blood pressure 144/90, pulse 64, temperature 97 F (36.1 C), temperature source Temporal, resp. rate 20, height 4\' 11"  (1.499 m), weight 278 lb 12.8 oz (126.463  kg). Body mass index is 56.31 kg/(m^2).  Gen:  WDWN AAfemale NAD  Neurological: Alert and oriented to person, place, and time. Motor and sensory function is grossly intact  Head: Normocephalic and atraumatic.  Eyes: Conjunctivae are normal. Pupils are equal, round, and reactive to light. No scleral icterus.  Neck: Normal range of motion. Neck supple. No tracheal deviation or thyromegaly present.  Cardiovascular:  SR without murmurs or gallops.  No carotid bruits Respiratory: Effort normal.  No respiratory distress. No chest wall tenderness. Breath sounds normal.  No wheezes, rales or rhonchi.  Abdomen:   nontender GU: Musculoskeletal: Normal range of motion. Extremities are nontender. No cyanosis, edema or clubbing noted Lymphadenopathy: No cervical, preauricular, postauricular or axillary adenopathy is present Skin: Skin is warm and dry. No rash noted. No diaphoresis. No erythema. No pallor. Pscyh: Normal mood and affect. Behavior is normal. Judgment and thought content normal.   LABORATORY RESULTS: No results found for this or any previous visit (from the past 48 hour(s)).  RADIOLOGY RESULTS: No results found.  Problem List: Patient Active Problem List  Diagnoses  . LIPOMA  . GLUCOSE INTOLERANCE  . DYSLIPIDEMIA  . ANXIETY STATE NOS  . MIGRAINE HEADACHE  . HYPERTENSION  . CAROTID ARTERY STENOSIS, BILATERAL  . CEREBROVASCULAR ACCIDENT  . PHARYNGITIS  . ALLERGIC RHINITIS  . LOW BACK PAIN  . BACK PAIN  . HEADACHE  . ANKLE INJURY, RIGHT    Assessment & Plan: Lap cholecystitis for symptomatic gallstones The thyroid nodule bx was consistent with nonneoplastic goiter.     Matt B. Daphine Deutscher, MD, [facility] [phone] beeper [phone]  12/04/2011 11:13 AM

## 2011-12-04 NOTE — Telephone Encounter (Signed)
Husband answered cell phone and stated she was at work. Had no problems post procedure except a little sore. Was able to return to work on 12/02/11.

## 2011-12-05 ENCOUNTER — Telehealth (HOSPITAL_COMMUNITY): Payer: Self-pay

## 2011-12-05 NOTE — Telephone Encounter (Signed)
Patient returned call.  She states she is doing well after procedure and is waiting on results.

## 2011-12-20 ENCOUNTER — Emergency Department (HOSPITAL_COMMUNITY)
Admission: EM | Admit: 2011-12-20 | Discharge: 2011-12-20 | Disposition: A | Discharge status: Home / Self Care | Payer: Self-pay | Attending: Emergency Medicine | Admitting: Emergency Medicine

## 2011-12-20 ENCOUNTER — Encounter (HOSPITAL_COMMUNITY): Payer: Self-pay

## 2011-12-20 DIAGNOSIS — E7800 Pure hypercholesterolemia, unspecified: Secondary | ICD-10-CM | POA: Insufficient documentation

## 2011-12-20 DIAGNOSIS — K8020 Calculus of gallbladder without cholecystitis without obstruction: Secondary | ICD-10-CM | POA: Insufficient documentation

## 2011-12-20 DIAGNOSIS — I10 Essential (primary) hypertension: Secondary | ICD-10-CM | POA: Insufficient documentation

## 2011-12-20 DIAGNOSIS — R112 Nausea with vomiting, unspecified: Secondary | ICD-10-CM | POA: Insufficient documentation

## 2011-12-20 DIAGNOSIS — R1011 Right upper quadrant pain: Secondary | ICD-10-CM | POA: Insufficient documentation

## 2011-12-20 LAB — COMPREHENSIVE METABOLIC PANEL
ALT: 26 U/L (ref 0–35)
AST: 38 U/L — ABNORMAL HIGH (ref 0–37)
Albumin: 3.5 g/dL (ref 3.5–5.2)
Alkaline Phosphatase: 83 U/L (ref 39–117)
BUN: 15 mg/dL (ref 6–23)
CO2: 29 mEq/L (ref 19–32)
Calcium: 9.9 mg/dL (ref 8.4–10.5)
Chloride: 103 mEq/L (ref 96–112)
Creatinine, Ser: 1.04 mg/dL (ref 0.50–1.10)
GFR calc Af Amer: 70 mL/min — ABNORMAL LOW (ref >90)
GFR calc non Af Amer: 61 mL/min — ABNORMAL LOW (ref >90)
Glucose, Bld: 94 mg/dL (ref 70–99)
Potassium: 4.1 mEq/L (ref 3.5–5.1)
Sodium: 139 mEq/L (ref 135–145)
Total Bilirubin: 0.5 mg/dL (ref 0.3–1.2)
Total Protein: 7.4 g/dL (ref 6.0–8.3)

## 2011-12-20 LAB — DIFFERENTIAL
Basophils Absolute: 0 10*3/uL (ref 0.0–0.1)
Basophils Relative: 0 % (ref 0–1)
Eosinophils Absolute: 0.2 10*3/uL (ref 0.0–0.7)
Eosinophils Relative: 1 % (ref 0–5)
Lymphocytes Relative: 40 % (ref 12–46)
Lymphs Abs: 4.7 10*3/uL — ABNORMAL HIGH (ref 0.7–4.0)
Monocytes Absolute: 0.8 10*3/uL (ref 0.1–1.0)
Monocytes Relative: 7 % (ref 3–12)
Neutro Abs: 6.1 10*3/uL (ref 1.7–7.7)
Neutrophils Relative %: 52 % (ref 43–77)

## 2011-12-20 LAB — CBC
HCT: 40.1 % (ref 36.0–46.0)
Hemoglobin: 13.1 g/dL (ref 12.0–15.0)
MCH: 27.8 pg (ref 26.0–34.0)
MCHC: 32.7 g/dL (ref 30.0–36.0)
MCV: 85.1 fL (ref 78.0–100.0)
Platelets: 321 10*3/uL (ref 150–400)
RBC: 4.71 MIL/uL (ref 3.87–5.11)
RDW: 15.3 % (ref 11.5–15.5)
WBC: 11.7 10*3/uL — ABNORMAL HIGH (ref 4.0–10.5)

## 2011-12-20 LAB — LIPASE, BLOOD: Lipase: 42 U/L (ref 11–59)

## 2011-12-20 MED ORDER — HYDROMORPHONE HCL PF 1 MG/ML IJ SOLN
1.0000 mg | Freq: Once | INTRAMUSCULAR | Status: AC
  Administered 2011-12-20: 1 mg via INTRAVENOUS
  Filled 2011-12-20: qty 1

## 2011-12-20 MED ORDER — HYDROMORPHONE HCL PF 1 MG/ML IJ SOLN
0.5000 mg | Freq: Once | INTRAMUSCULAR | Status: AC
  Administered 2011-12-20: 0.5 mg via INTRAVENOUS
  Filled 2011-12-20: qty 1

## 2011-12-20 MED ORDER — OXYCODONE-ACETAMINOPHEN 5-325 MG PO TABS
1.0000 | ORAL_TABLET | Freq: Four times a day (QID) | ORAL | Status: AC | PRN
Start: 2011-12-20 — End: 2011-12-30

## 2011-12-20 MED ORDER — ONDANSETRON HCL 4 MG/2ML IJ SOLN
4.0000 mg | Freq: Once | INTRAMUSCULAR | Status: AC
  Administered 2011-12-20: 4 mg via INTRAVENOUS
  Filled 2011-12-20: qty 2

## 2011-12-20 MED ORDER — ONDANSETRON 8 MG PO TBDP: 8.0000 mg | ORAL_TABLET | Freq: Three times a day (TID) | ORAL | Status: AC | PRN

## 2011-12-20 NOTE — Discharge Instructions (Signed)
Please read and follow all provided instructions.  Your diagnoses today include:  1. Cholelithiasis     Tests performed today include:  Blood counts and electrolytes  Blood tests to check liver and kidney function  Blood tests to check pancreas function  Vital signs. See below for your results today.   Medications prescribed:   Percocet (oxycodone/acetaminophen) - narcotic pain medication  You have been prescribed narcotic pain medication such as Vicodin or Percocet: DO NOT drive or perform any activities that require you to be awake and alert because this medicine can make you drowsy. BE VERY CAREFUL not to take multiple medicines containing Tylenol (also called acetaminophen). Doing so can lead to an overdose which can damage your liver and cause liver failure and possibly death.    Zofran (ondansetron) - for nausea and vomiting  Take any prescribed medications only as directed.  Home care instructions:   Follow any educational materials contained in this packet.  Follow-up instructions: Please follow-up with your surgeon as needed.   If you do not have a primary care doctor -- see below for referral information.   Return instructions:  SEEK IMMEDIATE MEDICAL ATTENTION IF:  The pain does not go away or becomes severe   A temperature above 101F develops   Repeated vomiting occurs (multiple episodes)   The pain becomes localized to portions of the abdomen. The right side could possibly be appendicitis. In an adult, the left lower portion of the abdomen could be colitis or diverticulitis.   Blood is being passed in stools or vomit (bright red or black tarry stools)   You develop chest pain, difficulty breathing, dizziness or fainting, or become confused, poorly responsive, or inconsolable (young children)  If you have any other emergent concerns regarding your health  Additional Information: Abdominal (belly) pain can be caused by many things. Your caregiver  performed an examination and possibly ordered blood/urine tests and imaging (CT scan, x-rays, ultrasound). Many cases can be observed and treated at home after initial evaluation in the emergency department. Even though you are being discharged home, abdominal pain can be unpredictable. Therefore, you need a repeated exam if your pain does not resolve, returns, or worsens. Most patients with abdominal pain don't have to be admitted to the hospital or have surgery, but serious problems like appendicitis and gallbladder attacks can start out as nonspecific pain. Many abdominal conditions cannot be diagnosed in one visit, so follow-up evaluations are very important.  Your vital signs today were: BP 136/88  Pulse 88  Temp(Src) 97.9 F (36.6 C) (Oral)  Resp 21  SpO2 100% If your blood pressure (bp) was elevated above 135/85 this visit, please have this repeated by your doctor within one month. -------------- No Primary Care Doctor Call [facility]  [phone] Other agencies that provide inexpensive medical care    [facility]  [phone]    [facility]  [phone]    [facility]  [phone]    [facility]  [phone]    [facility]  [phone]    [facility]  [phone] -------------- RESOURCE GUIDE:  Dental Problems  Patients with Medicaid: [facility] [phone] [address]  [address] Phone:  [phone]                                                      Phone:  [phone]  If unable to pay or uninsured, contact:  [facility] or [facility]. to become qualified for the adult dental clinic.  Chronic Pain Problems Contact [facility]  [phone] Patients need to be referred by their primary care doctor.  Insufficient Money for Medicine Contact United Way:  call "211" or [facility]  [phone].  Psychological Services [facility]  [phone] [facility]  [phone] [facility]   [phone] (emergency services [phone])  Substance Abuse Resources [facility]  [phone] [facility] [phone] [facility] [phone] Floydene Flock [phone] Residential & Outpatient Substance Abuse Program  [phone]  Abuse/Neglect [facility] Child Abuse Hotline [phone] [facility] Child Abuse Hotline 3397876496 (After Hours)  Emergency Shelter [facility] [phone]  Maternity Homes Room at the [place] [facility] [phone] [place] [facility] [phone]  [facility] Resources  [facility]     [employer]                          [facility] Dept. [address] [place]                       [address]      [address]  Blondell Reveal Phone:  [phone]                                   Phone:  [phone]                 Phone:  [phone]  [facility] Phone:  [phone]  [facility] Child Abuse Hotline [phone] (After Hours)

## 2011-12-20 NOTE — ED Notes (Signed)
Patient here with know gallbladder disease and scheduled for surgery 4/8. Patient reports this am developed pain related to same with nausea

## 2011-12-20 NOTE — ED Provider Notes (Signed)
Medical screening examination/treatment/procedure(s) were performed by non-physician practitioner and as supervising physician I was immediately available for consultation/collaboration.  Ethelda Chick, MD 12/20/11 1538

## 2011-12-20 NOTE — ED Provider Notes (Signed)
History     CSN: [id number]  Arrival date & time 12/20/11  1237   First MD Initiated Contact with Patient 12/20/11 1314      Chief Complaint  Patient presents with  . Abdominal Pain    gallbladder    (Consider location/radiation/quality/duration/timing/severity/associated sxs/prior treatment) HPI Comments: Patient with history of stroke and cholelithiasis -- presents with right upper quadrant pain that started approximately 11 AM. It has been associated with nausea and vomiting. No diarrhea. Pain does not radiate. Patient states she has had similar pain due to gallstones in the past that have been worse. She denies fever. Patient saw Dr. Daphine Deutscher on 12/04/2011 and was scheduled for upcoming cholecystectomy on 04/08. No treatments prior to arrival.  Patient is a 51 y.o. female presenting with abdominal pain. The history is provided by the patient.  Abdominal Pain The primary symptoms of the illness include abdominal pain, nausea and vomiting. The primary symptoms of the illness do not include fever, diarrhea or dysuria. The current episode started 1 to 2 hours ago. The onset of the illness was sudden.  Symptoms associated with the illness do not include chills or constipation. Significant associated medical issues include gallstones.    Past Medical History  Diagnosis Date  . Hypertension   . Hypercholesteremia   . Migraine   . Lower back pain   . Nontoxic uninodular goiter   . Calculus of gallbladder without mention of cholecystitis or obstruction   . Arthritis   . Stroke 2000    Past Surgical History  Procedure Date  . Abdominal hysterectomy   . Knee arthroscopy     Family History  Problem Relation Age of Onset  . Cancer Brother     colon and lung  . Cancer Maternal Grandmother     colon    History  Substance Use Topics  . Smoking status: Never Smoker   . Smokeless tobacco: Not on file  . Alcohol Use: No    OB History    Grav Para Term Preterm Abortions TAB  SAB Ect Mult Living                  Review of Systems  Constitutional: Negative for fever and chills.  HENT: Negative for sore throat and rhinorrhea.   Eyes: Negative for redness.  Respiratory: Negative for cough.   Cardiovascular: Negative for chest pain.  Gastrointestinal: Positive for nausea, vomiting and abdominal pain. Negative for diarrhea, constipation and blood in stool.  Genitourinary: Negative for dysuria.  Musculoskeletal: Negative for myalgias.  Skin: Negative for rash.  Neurological: Negative for headaches.    Allergies  Naproxen  Home Medications   Current Outpatient Rx  Name Route Sig Dispense Refill  . AMLODIPINE BESYLATE 5 MG PO TABS Oral Take 5 mg by mouth daily.      . ASPIRIN 81 MG PO CHEW Oral Chew 81 mg by mouth daily.      Marland Kitchen FLUTICASONE PROPIONATE 50 MCG/ACT NA SUSP Nasal Place 2 sprays into the nose daily.    Marland Kitchen LISINOPRIL-HYDROCHLOROTHIAZIDE 20-25 MG PO TABS Oral Take 1 tablet by mouth daily.      Marland Kitchen METOPROLOL SUCCINATE ER 50 MG PO TB24 Oral Take 50 mg by mouth daily. Take with or immediately following a meal.    . PRAVASTATIN SODIUM 40 MG PO TABS Oral Take 40 mg by mouth daily.      BP 136/88  Pulse 88  Temp(Src) 97.9 F (36.6 C) (Oral)  Resp 21  SpO2  100%  Physical Exam  Nursing note and vitals reviewed. Constitutional: She is oriented to person, place, and time. She appears well-developed and well-nourished.  HENT:  Head: Normocephalic and atraumatic.  Eyes: Conjunctivae are normal. Right eye exhibits no discharge. Left eye exhibits no discharge.  Neck: Normal range of motion. Neck supple.  Cardiovascular: Normal rate, regular rhythm and normal heart sounds.   Pulmonary/Chest: Effort normal and breath sounds normal.  Abdominal: Soft. There is tenderness. There is positive Murphy's sign. There is no tenderness at McBurney's point.    Neurological: She is alert and oriented to person, place, and time.  Skin: Skin is warm and dry.    Psychiatric: She has a normal mood and affect.    ED Course  Procedures (including critical care time)  Labs Reviewed  CBC - Abnormal; Notable for the following:    WBC 11.7 (*)    All other components within normal limits  DIFFERENTIAL - Abnormal; Notable for the following:    Lymphs Abs 4.7 (*)    All other components within normal limits  COMPREHENSIVE METABOLIC PANEL - Abnormal; Notable for the following:    AST 38 (*)    GFR calc non Af Amer 61 (*)    GFR calc Af Amer 70 (*)    All other components within normal limits  LIPASE, BLOOD   No results found.   1. Cholelithiasis     1:26 PM Patient seen and examined. Work-up initiated. Medications ordered.   Vital signs reviewed and are as follows: Filed Vitals:   12/20/11 1246  BP: 136/88  Pulse: 88  Temp: 97.9 F (36.6 C)  Resp: 21   3:16 PM Pain well controlled with IV pain medicine. Patient is drinking water in room. Patient is requesting discharge to home. She is out of pain medication and this was refilled. Also given prescription for Zofran.  3:18 PM Patient was discussed with Ethelda Chick, MD  The patient was urged to return to the Emergency Department immediately with worsening of current symptoms, worsening abdominal pain, persistent vomiting, blood noted in stools, fever, or any other concerns. The patient verbalized understanding.   Patient counseled on use of narcotic pain medications. Counseled not to combine these medications with others containing tylenol. Urged not to drink alcohol, drive, or perform any other activities that requires focus while taking these medications. The patient verbalizes understanding and agrees with the plan.  MDM  Patient with RUQ abd pain c/w previous biliary colic. Symptoms controlled after one dose of pain medication. Patient is comfortable. Labs/vitals are not concerning for cholecystitis at this time. Patient tolerating PO's. No indication to repeat US. Patient given  strict return instructions that would indicate cholecystitis.         Renne Crigler, [place] 12/20/11 1521

## 2011-12-23 ENCOUNTER — Encounter (HOSPITAL_COMMUNITY): Payer: Self-pay

## 2011-12-25 ENCOUNTER — Encounter (HOSPITAL_COMMUNITY)
Admission: RE | Admit: 2011-12-25 | Discharge: 2011-12-25 | Disposition: A | Discharge status: Home / Self Care | Payer: Medicaid Other | Source: Ambulatory Visit | Attending: Surgery | Admitting: Surgery

## 2011-12-25 ENCOUNTER — Encounter (HOSPITAL_COMMUNITY): Payer: Self-pay

## 2011-12-25 HISTORY — DX: Sleep apnea, unspecified: G47.30

## 2011-12-25 LAB — SURGICAL PCR SCREEN
MRSA, PCR: NEGATIVE
Staphylococcus aureus: NEGATIVE

## 2011-12-25 NOTE — Patient Instructions (Signed)
20 Jodi Bowman  12/25/2011   Your procedure is scheduled on:  01-05-12  Report to [facility] at 0530  AM.  Call this number if you have problems the morning of surgery: [phone]   Remember:fleets enema night before surgery   Do not eat food or drink liquids:After Midnight.  .  Take these medicines the morning of surgery with A SIP OF WATER: oxycodone if needed,amlodipine, loratadine, toprol    Do not wear jewelry or make up.  Do not wear lotions, powders, or perfumes.Do not wear deodorant.    Do not bring valuables to the hospital.  Contacts, dentures or bridgework may not be worn into surgery.  Leave suitcase in the car. After surgery it may be brought to your room.  For patients admitted to the hospital, checkout time is 11:00 AM the day of discharge.     Special Instructions: CHG Shower Use Special Wash: 1/2 bottle night before surgery and 1/2 bottle morning of surgery.neck down avoid private area, no shaving for 2 days before surgery   Please read over the following fact sheets that you were given: MRSA Information  Cain S[facility] pre op nurse phone number [phone], call if needed

## 2011-12-25 NOTE — Progress Notes (Signed)
12/25/11 1330  OBSTRUCTIVE SLEEP APNEA  Have you ever been diagnosed with sleep apnea through a sleep study? No  Do you snore loudly (loud enough to be heard through closed doors)?  1  Do you often feel tired, fatigued, or sleepy during the daytime? 1  Has anyone observed you stop breathing during your sleep? 0  Do you have, or are you being treated for high blood pressure? 1  BMI more than 35 kg/m2? 1  Age over 52 years old? 1  Neck circumference greater than 40 cm/18 inches? 0  Gender: 0  Obstructive Sleep Apnea Score 5   Score 4 or greater  Updated health history;Results sent to PCP

## 2011-12-29 NOTE — Pre-Procedure Instructions (Signed)
Chest 2 view 09-22-11 epic ekg 09-22-11 epic

## 2012-01-04 ENCOUNTER — Emergency Department (HOSPITAL_COMMUNITY): Payer: Medicaid Other

## 2012-01-04 ENCOUNTER — Inpatient Hospital Stay (HOSPITAL_COMMUNITY)
Admission: EM | Admit: 2012-01-04 | Discharge: 2012-01-06 | DRG: 419 | Disposition: A | Discharge status: Home / Self Care | Payer: Medicaid Other | Attending: Surgery | Admitting: Surgery

## 2012-01-04 ENCOUNTER — Encounter (HOSPITAL_COMMUNITY): Payer: Self-pay | Admitting: *Deleted

## 2012-01-04 DIAGNOSIS — Z8673 Personal history of transient ischemic attack (TIA), and cerebral infarction without residual deficits: Secondary | ICD-10-CM

## 2012-01-04 DIAGNOSIS — E7800 Pure hypercholesterolemia, unspecified: Secondary | ICD-10-CM | POA: Diagnosis present

## 2012-01-04 DIAGNOSIS — M129 Arthropathy, unspecified: Secondary | ICD-10-CM | POA: Diagnosis present

## 2012-01-04 DIAGNOSIS — I10 Essential (primary) hypertension: Secondary | ICD-10-CM | POA: Diagnosis present

## 2012-01-04 DIAGNOSIS — K819 Cholecystitis, unspecified: Secondary | ICD-10-CM

## 2012-01-04 DIAGNOSIS — Z01812 Encounter for preprocedural laboratory examination: Secondary | ICD-10-CM

## 2012-01-04 DIAGNOSIS — K810 Acute cholecystitis: Principal | ICD-10-CM | POA: Diagnosis present

## 2012-01-04 LAB — DIFFERENTIAL
Basophils Absolute: 0 10*3/uL (ref 0.0–0.1)
Basophils Relative: 0 % (ref 0–1)
Eosinophils Absolute: 0.1 10*3/uL (ref 0.0–0.7)
Eosinophils Relative: 1 % (ref 0–5)
Lymphocytes Relative: 35 % (ref 12–46)
Lymphs Abs: 3 10*3/uL (ref 0.7–4.0)
Monocytes Absolute: 0.8 10*3/uL (ref 0.1–1.0)
Monocytes Relative: 9 % (ref 3–12)
Neutro Abs: 4.7 10*3/uL (ref 1.7–7.7)
Neutrophils Relative %: 54 % (ref 43–77)

## 2012-01-04 LAB — COMPREHENSIVE METABOLIC PANEL
ALT: 468 U/L — ABNORMAL HIGH (ref 0–35)
AST: 436 U/L — ABNORMAL HIGH (ref 0–37)
Albumin: 3.6 g/dL (ref 3.5–5.2)
Alkaline Phosphatase: 159 U/L — ABNORMAL HIGH (ref 39–117)
BUN: 10 mg/dL (ref 6–23)
CO2: 29 mEq/L (ref 19–32)
Calcium: 9.3 mg/dL (ref 8.4–10.5)
Chloride: 102 mEq/L (ref 96–112)
Creatinine, Ser: 0.91 mg/dL (ref 0.50–1.10)
GFR calc Af Amer: 83 mL/min — ABNORMAL LOW (ref >90)
GFR calc non Af Amer: 71 mL/min — ABNORMAL LOW (ref >90)
Glucose, Bld: 90 mg/dL (ref 70–99)
Potassium: 4.1 mEq/L (ref 3.5–5.1)
Sodium: 138 mEq/L (ref 135–145)
Total Bilirubin: 2.2 mg/dL — ABNORMAL HIGH (ref 0.3–1.2)
Total Protein: 7.5 g/dL (ref 6.0–8.3)

## 2012-01-04 LAB — CBC
HCT: 39.9 % (ref 36.0–46.0)
Hemoglobin: 13.2 g/dL (ref 12.0–15.0)
MCH: 28.8 pg (ref 26.0–34.0)
MCHC: 33.1 g/dL (ref 30.0–36.0)
MCV: 87.1 fL (ref 78.0–100.0)
Platelets: 323 10*3/uL (ref 150–400)
RBC: 4.58 MIL/uL (ref 3.87–5.11)
RDW: 15.2 % (ref 11.5–15.5)
WBC: 8.6 10*3/uL (ref 4.0–10.5)

## 2012-01-04 LAB — LIPASE, BLOOD: Lipase: 34 U/L (ref 11–59)

## 2012-01-04 MED ORDER — HYDROMORPHONE HCL PF 1 MG/ML IJ SOLN: 1.0000 mg | Freq: Once | INTRAMUSCULAR | Status: DC

## 2012-01-04 MED ORDER — CEFAZOLIN SODIUM-DEXTROSE 2-3 GM-% IV SOLR
2.0000 g | Freq: Once | INTRAVENOUS | Status: DC
  Administered 2012-01-04: 2 g via INTRAVENOUS
  Filled 2012-01-04 (×2): qty 50

## 2012-01-04 MED ORDER — HYDROMORPHONE HCL PF 1 MG/ML IJ SOLN
1.0000 mg | Freq: Once | INTRAMUSCULAR | Status: AC
  Administered 2012-01-04: 1 mg via INTRAVENOUS
  Filled 2012-01-04: qty 1

## 2012-01-04 MED ORDER — SODIUM CHLORIDE 0.9 % IV BOLUS (SEPSIS)
500.0000 mL | Freq: Once | INTRAVENOUS | Status: AC
  Administered 2012-01-04: 500 mL via INTRAVENOUS

## 2012-01-04 NOTE — ED Provider Notes (Signed)
History     CSN: [id number]  Arrival date & time 01/04/12  1526   First MD Initiated Contact with Patient 01/04/12 1721      6:08 PM History reports right upper quadrant pain since Christmas the. States she was diagnosed with cholelithiasis. Is scheduled to have a cholecystectomy tomorrow at 5:30 AM with Dr. Daphine Deutscher. Reports oxycodone it is not helping  the pain and now the pain is unbearable. Symptoms associated with nausea. Denies fever, vomiting, urinary symptoms, diarrhea, vaginal symptoms. Patient is a 51 y.o. female presenting with abdominal pain. The history is provided by the patient.  Abdominal Pain The primary symptoms of the illness include abdominal pain and nausea. The primary symptoms of the illness do not include fever, shortness of breath, vomiting, diarrhea, dysuria or vaginal discharge.  The pain came on gradually. The abdominal pain has been gradually worsening since its onset. The abdominal pain is located in the RUQ. The abdominal pain radiates to the RUQ. The severity of the abdominal pain is 10/10. The abdominal pain is relieved by nothing. The abdominal pain is exacerbated by eating.  Symptoms associated with the illness do not include chills, constipation, urgency, hematuria, frequency or back pain.    Past Medical History  Diagnosis Date  . Hypertension   . Hypercholesteremia   . Lower back pain   . Nontoxic uninodular goiter     sees dr vollmer at [facility]  . Calculus of gallbladder without mention of cholecystitis or obstruction   . Arthritis   . Stroke 2000  . Migraine   . Sleep apnea     STOPBANG=5    Past Surgical History  Procedure Date  . Knee arthroscopy one 1995 and 1 in 1997    both knees done  . Abdominal hysterectomy   . Cesarean section yrs ago    done x 2  . Surgery for endometriosis yrs ago  . Thryoid biopsy December 01, 2011     at [facility]    Family History  Problem Relation Age of Onset  . Cancer Brother     colon and lung  . Cancer  Maternal Grandmother     colon    History  Substance Use Topics  . Smoking status: Never Smoker   . Smokeless tobacco: Never Used  . Alcohol Use: No    OB History    Grav Para Term Preterm Abortions TAB SAB Ect Mult Living                  Review of Systems  Constitutional: Negative for fever and chills.  Respiratory: Negative for shortness of breath.   Cardiovascular: Negative for chest pain.  Gastrointestinal: Positive for nausea and abdominal pain. Negative for vomiting, diarrhea and constipation.  Genitourinary: Negative for dysuria, urgency, frequency, hematuria, flank pain, vaginal discharge and vaginal pain.  Musculoskeletal: Negative for back pain.  All other systems reviewed and are negative.    Allergies  Naproxen  Home Medications   Current Outpatient Rx  Name Route Sig Dispense Refill  . AMLODIPINE BESYLATE 5 MG PO TABS Oral Take 5 mg by mouth every morning.     . ASPIRIN 81 MG PO CHEW Oral Chew 81 mg by mouth daily.     Marland Kitchen LISINOPRIL-HYDROCHLOROTHIAZIDE 20-25 MG PO TABS Oral Take 1 tablet by mouth every morning.     Marland Kitchen LORATADINE 10 MG PO TABS Oral Take 10 mg by mouth every morning.     Marland Kitchen METOPROLOL SUCCINATE ER 50 MG PO  TB24 Oral Take 50 mg by mouth every morning. Take with or immediately following a meal.    . ONDANSETRON HCL 8 MG PO TABS Oral Take 8 mg by mouth every 8 (eight) hours as needed. nausea    . OXYCODONE-ACETAMINOPHEN 5-325 MG PO TABS Oral Take 1 tablet by mouth every 4 (four) hours as needed. pain    . PRAVASTATIN SODIUM 40 MG PO TABS Oral Take 40 mg by mouth at bedtime.     . TRAMADOL HCL 50 MG PO TABS Oral Take 50 mg by mouth every 6 (six) hours as needed. pain      BP 116/73  Pulse 58  Temp(Src) 97.4 F (36.3 C) (Oral)  Resp 18  SpO2 100%  Physical Exam  Vitals reviewed. Constitutional: She is oriented to person, place, and time. Vital signs are normal. She appears well-developed and well-nourished.  HENT:  Head: Normocephalic and  atraumatic.  Eyes: Conjunctivae are normal. Pupils are equal, round, and reactive to light.  Neck: Normal range of motion. Neck supple.  Cardiovascular: Normal rate, regular rhythm and normal heart sounds.  Exam reveals no friction rub.   No murmur heard. Pulmonary/Chest: Effort normal and breath sounds normal. She has no wheezes. She has no rhonchi. She has no rales. She exhibits no tenderness.  Abdominal: Soft. Bowel sounds are normal. She exhibits distension. She exhibits no mass. There is tenderness (RUQ). There is no rebound and no guarding.       Obese  Musculoskeletal: Normal range of motion.  Neurological: She is alert and oriented to person, place, and time. Coordination normal.  Skin: Skin is warm and dry. No rash noted. No erythema. No pallor.    ED Course  Procedures Results for orders placed during the hospital encounter of 01/04/12  CBC      Component Value Range   WBC 8.6  4.0 - 10.5 (K/uL)   RBC 4.58  3.87 - 5.11 (MIL/uL)   Hemoglobin 13.2  12.0 - 15.0 (g/dL)   HCT 96.0  45.4 - 09.8 (%)   MCV 87.1  78.0 - 100.0 (fL)   MCH 28.8  26.0 - 34.0 (pg)   MCHC 33.1  30.0 - 36.0 (g/dL)   RDW 11.9  14.7 - 82.9 (%)   Platelets 323  150 - 400 (K/uL)  DIFFERENTIAL      Component Value Range   Neutrophils Relative 54  43 - 77 (%)   Neutro Abs 4.7  1.7 - 7.7 (K/uL)   Lymphocytes Relative 35  12 - 46 (%)   Lymphs Abs 3.0  0.7 - 4.0 (K/uL)   Monocytes Relative 9  3 - 12 (%)   Monocytes Absolute 0.8  0.1 - 1.0 (K/uL)   Eosinophils Relative 1  0 - 5 (%)   Eosinophils Absolute 0.1  0.0 - 0.7 (K/uL)   Basophils Relative 0  0 - 1 (%)   Basophils Absolute 0.0  0.0 - 0.1 (K/uL)  LIPASE, BLOOD      Component Value Range   Lipase 34  11 - 59 (U/L)  COMPREHENSIVE METABOLIC PANEL      Component Value Range   Sodium 138  135 - 145 (mEq/L)   Potassium 4.1  3.5 - 5.1 (mEq/L)   Chloride 102  96 - 112 (mEq/L)   CO2 29  19 - 32 (mEq/L)   Glucose, Bld 90  70 - 99 (mg/dL)   BUN 10  6 - 23  (mg/dL)   Creatinine, Ser 5.62  0.50 - 1.10 (mg/dL)   Calcium 9.3  8.4 - 45.4 (mg/dL)   Total Protein 7.5  6.0 - 8.3 (g/dL)   Albumin 3.6  3.5 - 5.2 (g/dL)   AST 098 (*) 0 - 37 (U/L)   ALT 468 (*) 0 - 35 (U/L)   Alkaline Phosphatase 159 (*) 39 - 117 (U/L)   Total Bilirubin 2.2 (*) 0.3 - 1.2 (mg/dL)   GFR calc non Af Amer 71 (*) >90 (mL/min)   GFR calc Af Amer 83 (*) >90 (mL/min)   Dg Abd 2 Views  01/04/2012  *RADIOLOGY REPORT*  Clinical Data: Abdominal pain, constipation  ABDOMEN - 2 VIEW  Comparison: 07/14/2011  Findings: There is nonspecific nonobstructive bowel gas pattern. Moderate stool noted in the right colon and transverse colon.  Some stool noted in proximal descending colon.  No free abdominal air.  IMPRESSION: Nonspecific nonobstructive bowel gas pattern.  Moderate stool noted in proximal colon.  Some stool noted in descending colon.  Original Report Authenticated By: Natasha Mead, M.D.     MDM   7:54 PM  Discussed plan with Dr. Ranae Palms. Will Continue to manage patient and likely admit     Thomasene Lot, PA-C 01/04/12 2009

## 2012-01-04 NOTE — ED Notes (Signed)
Pt rates pain at 1 at this time

## 2012-01-04 NOTE — ED Notes (Signed)
Pt reports history of gallstones diagnosed by Dr. Daphine Deutscher- he is doing her surgery tomorrow morning. Pt reports she has been taking pain medication at home- one tablet of oxycodone with last dose around 2PM.  Pt reports right upper abdominal pain with radiation into back.  Pt also taking zofran for nausea.  Pt denies vomiting. Nausea continues.

## 2012-01-04 NOTE — ED Notes (Signed)
Pt states that she has had abdominal pain, nausea and vomiting since Friday 4/5. She states she was in the ED for same symptoms 2 weeks ago and was diagnosed with gallstones. She was prescribed Zofran and oxycodone during this visit. She has taken both prescriptions today but received no relief from the pain. Pt states pain 9 out of 10 in her mid abdomen radiating towards her back. She is scheduled for gallbladder removal tomorrow 4/8.

## 2012-01-04 NOTE — H&P (Signed)
Jodi Bowman is an 51 y.o. female.   Chief Complaint: abdominal pain HPI: patient presents to the [facility] emergency room tonight with 48 hours of her system to epigastric and right upper quadrant abdominal pain and nausea. She has known symptomatic gallstones and is actually scheduled for elective cholecystectomy by Dr. Daphine Deutscher tomorrow morning. The pain however has been persistent for 2 days and not relieved by the pain medication she had at home. She has been medicated in the emergency room with improvement but still some persistent discomfort. She has no fever or chills. No jaundice noted.  Past Medical History  Diagnosis Date  . Hypertension   . Hypercholesteremia   . Lower back pain   . Nontoxic uninodular goiter     sees dr vollmer at [facility]  . Calculus of gallbladder without mention of cholecystitis or obstruction   . Arthritis   . Stroke 2000  . Migraine   . Sleep apnea     STOPBANG=5    Past Surgical History  Procedure Date  . Knee arthroscopy one 1995 and 1 in 1997    both knees done  . Abdominal hysterectomy   . Cesarean section yrs ago    done x 2  . Surgery for endometriosis yrs ago  . Thryoid biopsy December 01, 2011     [facility]    Family History  Problem Relation Age of Onset  . Cancer Brother     colon and lung  . Cancer Maternal Grandmother     colon   Social History:  reports that she has never smoked. She has never used smokeless tobacco. She reports that she does not drink alcohol or use illicit drugs.  Allergies:  Allergies  Allergen Reactions  . Naproxen     REACTION: Makes stomach hurt real bad    Medications Prior to Admission  Medication Dose Route Frequency Provider Last Rate Last Dose  . ceFAZolin (ANCEF) IVPB 2 g/50 mL premix  2 g Intravenous Once Loren Racer, MD      . HYDROmorphone (DILAUDID) injection 1 mg  1 mg Intravenous Once Thomasene Lot, PA-C   1 mg at 01/04/12 1900  . HYDROmorphone (DILAUDID) injection 1 mg  1 mg  Intravenous Once Thomasene Lot, PA-C   1 mg at 01/04/12 2037  . HYDROmorphone (DILAUDID) injection 1 mg  1 mg Intravenous Once Loren Racer, MD      . sodium chloride 0.9 % bolus 500 mL  500 mL Intravenous Once Thomasene Lot, PA-C   500 mL at 01/04/12 1900   Medications Prior to Admission  Medication Sig Dispense Refill  . amLODipine (NORVASC) 5 MG tablet Take 5 mg by mouth every morning.       Marland Kitchen aspirin 81 MG chewable tablet Chew 81 mg by mouth daily.       Marland Kitchen lisinopril-hydrochlorothiazide (PRINZIDE,ZESTORETIC) 20-25 MG per tablet Take 1 tablet by mouth every morning.       . loratadine (CLARITIN) 10 MG tablet Take 10 mg by mouth every morning.       . metoprolol succinate (TOPROL-XL) 50 MG 24 hr tablet Take 50 mg by mouth every morning. Take with or immediately following a meal.      . pravastatin (PRAVACHOL) 40 MG tablet Take 40 mg by mouth at bedtime.       . traMADol (ULTRAM) 50 MG tablet Take 50 mg by mouth every 6 (six) hours as needed. pain      . DISCONTD: fluticasone (FLONASE)  50 MCG/ACT nasal spray Place 2 sprays into the nose daily.      Marland Kitchen DISCONTD: metoprolol (LOPRESSOR) 50 MG tablet Take 50 mg by mouth 2 (two) times daily.        Marland Kitchen DISCONTD: simvastatin (ZOCOR) 20 MG tablet Take 20 mg by mouth at bedtime.          Results for orders placed during the hospital encounter of 01/04/12 (from the past 48 hour(s))  CBC     Status: Normal   Collection Time   01/04/12  6:50 PM      Component Value Range Comment   WBC 8.6  4.0 - 10.5 (K/uL)    RBC 4.58  3.87 - 5.11 (MIL/uL)    Hemoglobin 13.2  12.0 - 15.0 (g/dL)    HCT 09.8  11.9 - 14.7 (%)    MCV 87.1  78.0 - 100.0 (fL)    MCH 28.8  26.0 - 34.0 (pg)    MCHC 33.1  30.0 - 36.0 (g/dL)    RDW 82.9  56.2 - 13.0 (%)    Platelets 323  150 - 400 (K/uL)   DIFFERENTIAL     Status: Normal   Collection Time   01/04/12  6:50 PM      Component Value Range Comment   Neutrophils Relative 54  43 - 77 (%)    Neutro Abs 4.7  1.7 - 7.7  (K/uL)    Lymphocytes Relative 35  12 - 46 (%)    Lymphs Abs 3.0  0.7 - 4.0 (K/uL)    Monocytes Relative 9  3 - 12 (%)    Monocytes Absolute 0.8  0.1 - 1.0 (K/uL)    Eosinophils Relative 1  0 - 5 (%)    Eosinophils Absolute 0.1  0.0 - 0.7 (K/uL)    Basophils Relative 0  0 - 1 (%)    Basophils Absolute 0.0  0.0 - 0.1 (K/uL)   LIPASE, BLOOD     Status: Normal   Collection Time   01/04/12  6:50 PM      Component Value Range Comment   Lipase 34  11 - 59 (U/L)   COMPREHENSIVE METABOLIC PANEL     Status: Abnormal   Collection Time   01/04/12  6:50 PM      Component Value Range Comment   Sodium 138  135 - 145 (mEq/L)    Potassium 4.1  3.5 - 5.1 (mEq/L)    Chloride 102  96 - 112 (mEq/L)    CO2 29  19 - 32 (mEq/L)    Glucose, Bld 90  70 - 99 (mg/dL)    BUN 10  6 - 23 (mg/dL)    Creatinine, Ser 8.65  0.50 - 1.10 (mg/dL)    Calcium 9.3  8.4 - 10.5 (mg/dL)    Total Protein 7.5  6.0 - 8.3 (g/dL)    Albumin 3.6  3.5 - 5.2 (g/dL)    AST 784 (*) 0 - 37 (U/L)    ALT 468 (*) 0 - 35 (U/L)    Alkaline Phosphatase 159 (*) 39 - 117 (U/L)    Total Bilirubin 2.2 (*) 0.3 - 1.2 (mg/dL)    GFR calc non Af Amer 71 (*) >90 (mL/min)    GFR calc Af Amer 83 (*) >90 (mL/min)    US Abdomen Complete  01/04/2012  *RADIOLOGY REPORT*  Clinical Data:  Abdominal pain.  COMPLETE ABDOMINAL ULTRASOUND  Comparison:  None.  Findings:  Gallbladder:  2.5 cm gallstone.  Diffuse echogenicity throughout the gallbladder which may represent sludge.  Gallbladder wall appears thickened measuring up to 5 mm.  Question pericholecystic fluid.  The patient is tender over this region during scanning.  Common bile duct:  7.9 mm.  Liver:  Evaluation limited by habitus.  IVC:  Evaluation limited by habitus.  Pancreas:  Evaluation limited by habitus.  Spleen:  5.9 cm.  Right Kidney:  10.3 cm.  No hydronephrosis.  Right upper pole 2.3 cm cyst.  Left Kidney:  10.5 cm.  No hydronephrosis.  Lower pole 1.8 cm cyst.  Abdominal aorta:  Evaluation limited  by habitus.  IMPRESSION: Markedly abnormal appearance of the gallbladder.  There is at least a 2.5 cm gallstone with diffuse increased echogenicity throughout remainder of gallbladder (sludge versus stones versus abnormal mucosa).  Gallbladder wall thickening as well as tenderness of this region suggesting acute inflammatory process.  Remainder of exam is limited by patient's habitus.  Results discussed with Dr. Ranae Palms.  Original Report Authenticated By: Fuller Canada, M.D.   Dg Abd 2 Views  01/04/2012  *RADIOLOGY REPORT*  Clinical Data: Abdominal pain, constipation  ABDOMEN - 2 VIEW  Comparison: 07/14/2011  Findings: There is nonspecific nonobstructive bowel gas pattern. Moderate stool noted in the right colon and transverse colon.  Some stool noted in proximal descending colon.  No free abdominal air.  IMPRESSION: Nonspecific nonobstructive bowel gas pattern.  Moderate stool noted in proximal colon.  Some stool noted in descending colon.  Original Report Authenticated By: Natasha Mead, M.D.    Review of Systems  Constitutional: Negative for fever, chills and malaise/fatigue.  Respiratory: Negative for cough, sputum production, shortness of breath and wheezing.   Cardiovascular: Negative for chest pain, palpitations and leg swelling.  Gastrointestinal: Positive for nausea and abdominal pain. Negative for vomiting, diarrhea, constipation and blood in stool.  Genitourinary: Negative for dysuria and frequency.  Neurological: Negative for speech change and focal weakness.    Blood pressure 113/67, pulse 65, temperature 97.8 F (36.6 C), temperature source Oral, resp. rate 20, SpO2 99.00%. Physical Exam  General: Alert, morbidly obese African American female, in no distress Skin: Warm and dry without rash or infection. HEENT: No palpable masses or thyromegaly. Sclera nonicteric. Pupils equal round and reactive.  Lymph nodes: No cervical, supraclavicular nodes palpable. Lungs: Breath sounds clear  and equal without increased work of breathing Cardiovascular: Regular rate and rhythm without murmur. No JVD or edema. Peripheral pulses intact. Abdomen: Nondistended. Obese. There is epigastric and right upper quadrant tenderness with guarding.. No masses palpable. No organomegaly. No palpable hernias. Extremities: No edema or joint swelling or deformity. No chronic venous stasis changes. Neurologic: Alert and fully oriented. No gross motor deficits.  Assessment/Plan Cholelithiasis and now has developed apparent acute cholecystitis. The patient is being admitted for pain control and will start IV antibiotics. Dr. Daphine Deutscher will make final determination tomorrow morning but I expect will proceed with cholecystectomy and cholangiogram as scheduled.  Milady Fleener T 01/04/2012, 10:20 PM

## 2012-01-05 ENCOUNTER — Encounter (HOSPITAL_COMMUNITY): Payer: Self-pay | Admitting: Anesthesiology

## 2012-01-05 ENCOUNTER — Encounter (HOSPITAL_COMMUNITY)
Admission: EM | Disposition: A | Discharge status: Home / Self Care | Payer: Self-pay | Source: Home / Self Care | Attending: Surgery

## 2012-01-05 ENCOUNTER — Ambulatory Visit (HOSPITAL_COMMUNITY): Admission: RE | Admit: 2012-01-05 | Payer: Self-pay | Source: Ambulatory Visit | Admitting: Surgery

## 2012-01-05 ENCOUNTER — Inpatient Hospital Stay (HOSPITAL_COMMUNITY): Payer: Medicaid Other

## 2012-01-05 ENCOUNTER — Encounter (HOSPITAL_COMMUNITY): Payer: Self-pay

## 2012-01-05 ENCOUNTER — Inpatient Hospital Stay (HOSPITAL_COMMUNITY): Payer: Medicaid Other | Admitting: Anesthesiology

## 2012-01-05 DIAGNOSIS — K8010 Calculus of gallbladder with chronic cholecystitis without obstruction: Secondary | ICD-10-CM

## 2012-01-05 HISTORY — PX: CHOLECYSTECTOMY: SHX55

## 2012-01-05 SURGERY — LAPAROSCOPIC CHOLECYSTECTOMY WITH INTRAOPERATIVE CHOLANGIOGRAM
Anesthesia: General | Site: Abdomen | Wound class: Contaminated

## 2012-01-05 MED ORDER — IOHEXOL 300 MG/ML  SOLN
INTRAMUSCULAR | Status: AC
  Filled 2012-01-05: qty 1

## 2012-01-05 MED ORDER — MORPHINE SULFATE 2 MG/ML IJ SOLN
1.0000 mg | INTRAMUSCULAR | Status: DC | PRN
  Administered 2012-01-05 (×2): 1 mg via INTRAVENOUS
  Filled 2012-01-05 (×2): qty 1

## 2012-01-05 MED ORDER — LACTATED RINGERS IV SOLN: INTRAVENOUS | Status: DC

## 2012-01-05 MED ORDER — FENTANYL CITRATE 0.05 MG/ML IJ SOLN
25.0000 ug | INTRAMUSCULAR | Status: DC | PRN
  Administered 2012-01-05 (×2): 25 ug via INTRAVENOUS

## 2012-01-05 MED ORDER — BUPIVACAINE LIPOSOME 1.3 % IJ SUSP
20.0000 mL | INTRAMUSCULAR | Status: AC
  Administered 2012-01-05: 20 mL
  Filled 2012-01-05: qty 20

## 2012-01-05 MED ORDER — METOPROLOL SUCCINATE ER 50 MG PO TB24
50.0000 mg | ORAL_TABLET | Freq: Once | ORAL | Status: AC
  Administered 2012-01-05: 50 mg via ORAL
  Filled 2012-01-05: qty 1

## 2012-01-05 MED ORDER — METOPROLOL TARTRATE 1 MG/ML IV SOLN
10.0000 mg | Freq: Four times a day (QID) | INTRAVENOUS | Status: DC
  Filled 2012-01-05 (×6): qty 10

## 2012-01-05 MED ORDER — ACETAMINOPHEN 10 MG/ML IV SOLN
INTRAVENOUS | Status: AC
  Filled 2012-01-05: qty 100

## 2012-01-05 MED ORDER — LIDOCAINE HCL (CARDIAC) 20 MG/ML IV SOLN
INTRAVENOUS | Status: DC | PRN
  Administered 2012-01-05: 100 mg via INTRAVENOUS

## 2012-01-05 MED ORDER — POTASSIUM CHLORIDE IN NACL 20-0.45 MEQ/L-% IV SOLN
INTRAVENOUS | Status: DC
  Administered 2012-01-05: 01:00:00 via INTRAVENOUS
  Filled 2012-01-05 (×4): qty 1000

## 2012-01-05 MED ORDER — NEOSTIGMINE METHYLSULFATE 1 MG/ML IJ SOLN
INTRAMUSCULAR | Status: DC | PRN
  Administered 2012-01-05: 4 mg via INTRAVENOUS

## 2012-01-05 MED ORDER — ROCURONIUM BROMIDE 100 MG/10ML IV SOLN
INTRAVENOUS | Status: DC | PRN
  Administered 2012-01-05: 5 mg via INTRAVENOUS
  Administered 2012-01-05: 10 mg via INTRAVENOUS
  Administered 2012-01-05: 35 mg via INTRAVENOUS

## 2012-01-05 MED ORDER — HEPARIN SODIUM (PORCINE) 5000 UNIT/ML IJ SOLN
INTRAMUSCULAR | Status: AC
  Filled 2012-01-05: qty 1

## 2012-01-05 MED ORDER — ONDANSETRON HCL 4 MG/2ML IJ SOLN: 4.0000 mg | Freq: Four times a day (QID) | INTRAMUSCULAR | Status: DC | PRN

## 2012-01-05 MED ORDER — LACTATED RINGERS IR SOLN
Status: DC | PRN
  Administered 2012-01-05: 1000 mL

## 2012-01-05 MED ORDER — KCL IN DEXTROSE-NACL 20-5-0.45 MEQ/L-%-% IV SOLN
INTRAVENOUS | Status: DC
  Administered 2012-01-05 – 2012-01-06 (×2): via INTRAVENOUS
  Filled 2012-01-05 (×4): qty 1000

## 2012-01-05 MED ORDER — ACETAMINOPHEN 10 MG/ML IV SOLN
INTRAVENOUS | Status: DC | PRN
  Administered 2012-01-05: 1000 mg via INTRAVENOUS

## 2012-01-05 MED ORDER — ONDANSETRON HCL 4 MG/2ML IJ SOLN
INTRAMUSCULAR | Status: DC | PRN
  Administered 2012-01-05: 4 mg via INTRAVENOUS

## 2012-01-05 MED ORDER — MORPHINE SULFATE 2 MG/ML IJ SOLN: 2.0000 mg | INTRAMUSCULAR | Status: DC | PRN

## 2012-01-05 MED ORDER — FENTANYL CITRATE 0.05 MG/ML IJ SOLN
INTRAMUSCULAR | Status: AC
  Filled 2012-01-05: qty 2

## 2012-01-05 MED ORDER — PROPOFOL 10 MG/ML IV BOLUS
INTRAVENOUS | Status: DC | PRN
  Administered 2012-01-05: 180 mg via INTRAVENOUS

## 2012-01-05 MED ORDER — SODIUM CHLORIDE 0.9 % IV SOLN
1.0000 g | Freq: Every day | INTRAVENOUS | Status: DC
  Administered 2012-01-05: 1 g via INTRAVENOUS
  Filled 2012-01-05 (×2): qty 1

## 2012-01-05 MED ORDER — IOHEXOL 300 MG/ML  SOLN
INTRAMUSCULAR | Status: DC | PRN
  Administered 2012-01-05: 3 mL via INTRAVENOUS

## 2012-01-05 MED ORDER — LACTATED RINGERS IV SOLN
INTRAVENOUS | Status: DC | PRN
  Administered 2012-01-05 (×2): via INTRAVENOUS

## 2012-01-05 MED ORDER — HEPARIN SODIUM (PORCINE) 5000 UNIT/ML IJ SOLN
5000.0000 [IU] | Freq: Three times a day (TID) | INTRAMUSCULAR | Status: DC
  Administered 2012-01-05 – 2012-01-06 (×3): 5000 [IU] via SUBCUTANEOUS
  Filled 2012-01-05 (×6): qty 1

## 2012-01-05 MED ORDER — MIDAZOLAM HCL 5 MG/5ML IJ SOLN
INTRAMUSCULAR | Status: DC | PRN
  Administered 2012-01-05: 2 mg via INTRAVENOUS

## 2012-01-05 MED ORDER — FENTANYL CITRATE 0.05 MG/ML IJ SOLN
INTRAMUSCULAR | Status: DC | PRN
  Administered 2012-01-05 (×3): 50 ug via INTRAVENOUS
  Administered 2012-01-05: 100 ug via INTRAVENOUS

## 2012-01-05 MED ORDER — PROMETHAZINE HCL 25 MG/ML IJ SOLN: 6.2500 mg | INTRAMUSCULAR | Status: DC | PRN

## 2012-01-05 MED ORDER — OXYCODONE-ACETAMINOPHEN 5-325 MG PO TABS
1.0000 | ORAL_TABLET | ORAL | Status: DC | PRN
  Administered 2012-01-05 – 2012-01-06 (×3): 2 via ORAL
  Filled 2012-01-05 (×3): qty 2

## 2012-01-05 MED ORDER — SUCCINYLCHOLINE CHLORIDE 20 MG/ML IJ SOLN
INTRAMUSCULAR | Status: DC | PRN
  Administered 2012-01-05: 100 mg via INTRAVENOUS

## 2012-01-05 MED ORDER — GLYCOPYRROLATE 0.2 MG/ML IJ SOLN
INTRAMUSCULAR | Status: DC | PRN
  Administered 2012-01-05: 0.6 mg via INTRAVENOUS

## 2012-01-05 MED ORDER — HEPARIN SODIUM (PORCINE) 5000 UNIT/ML IJ SOLN
5000.0000 [IU] | Freq: Three times a day (TID) | INTRAMUSCULAR | Status: DC
  Administered 2012-01-05: 5000 [IU] via SUBCUTANEOUS
  Filled 2012-01-05 (×5): qty 1

## 2012-01-05 MED ORDER — SODIUM CHLORIDE 0.9 % IR SOLN
Status: DC | PRN
  Administered 2012-01-05: 1000 mL

## 2012-01-05 MED ORDER — METOPROLOL SUCCINATE ER 50 MG PO TB24
50.0000 mg | ORAL_TABLET | ORAL | Status: DC
  Filled 2012-01-05: qty 1

## 2012-01-05 SURGICAL SUPPLY — 46 items
APL SKNCLS STERI-STRIP NONHPOA (GAUZE/BANDAGES/DRESSINGS) ×1
APPLIER CLIP 5 13 M/L LIGAMAX5 (MISCELLANEOUS)
APPLIER CLIP ROT 10 11.4 M/L (STAPLE) ×2
APR CLP MED LRG 11.4X10 (STAPLE) ×1
APR CLP MED LRG 5 ANG JAW (MISCELLANEOUS)
BAG SPEC RTRVL LRG 6X4 10 (ENDOMECHANICALS) ×1
BENZOIN TINCTURE PRP APPL 2/3 (GAUZE/BANDAGES/DRESSINGS) ×2 IMPLANT
CABLE HIGH FREQUENCY MONO STRZ (ELECTRODE) ×2 IMPLANT
CANISTER SUCTION 2500CC (MISCELLANEOUS) ×2 IMPLANT
CATH REDDICK CHOLANGI 4FR 50CM (CATHETERS) ×2 IMPLANT
CLIP APPLIE 5 13 M/L LIGAMAX5 (MISCELLANEOUS) IMPLANT
CLIP APPLIE ROT 10 11.4 M/L (STAPLE) ×1 IMPLANT
CLOTH BEACON ORANGE TIMEOUT ST (SAFETY) ×2 IMPLANT
COVER MAYO STAND STRL (DRAPES) ×2 IMPLANT
COVER SURGICAL LIGHT HANDLE (MISCELLANEOUS) ×4 IMPLANT
DECANTER SPIKE VIAL GLASS SM (MISCELLANEOUS) IMPLANT
DEVICE TROCAR PUNCTURE CLOSURE (ENDOMECHANICALS) ×2 IMPLANT
DRAPE C-ARM 42X72 X-RAY (DRAPES) ×2 IMPLANT
DRAPE LAPAROSCOPIC ABDOMINAL (DRAPES) ×2 IMPLANT
ELECT REM PT RETURN 9FT ADLT (ELECTROSURGICAL) ×2
ELECTRODE REM PT RTRN 9FT ADLT (ELECTROSURGICAL) ×1 IMPLANT
GLOVE BIOGEL M 8.0 STRL (GLOVE) ×2 IMPLANT
GOWN STRL NON-REIN LRG LVL3 (GOWN DISPOSABLE) ×2 IMPLANT
GOWN STRL REIN XL XLG (GOWN DISPOSABLE) ×4 IMPLANT
HEMOSTAT SURGICEL 4X8 (HEMOSTASIS) IMPLANT
IV CATH 14GX2 1/4 (CATHETERS) ×2 IMPLANT
KIT BASIN OR (CUSTOM PROCEDURE TRAY) ×2 IMPLANT
NS IRRIG 1000ML POUR BTL (IV SOLUTION) ×2 IMPLANT
POUCH SPECIMEN RETRIEVAL 10MM (ENDOMECHANICALS) ×2 IMPLANT
SCISSORS LAP 5X35 DISP (ENDOMECHANICALS) ×2 IMPLANT
SET IRRIG TUBING LAPAROSCOPIC (IRRIGATION / IRRIGATOR) ×2 IMPLANT
SLEEVE Z-THREAD 5X100MM (TROCAR) IMPLANT
SOLUTION ANTI FOG 6CC (MISCELLANEOUS) ×2 IMPLANT
SPONGE GAUZE 4X4 12PLY (GAUZE/BANDAGES/DRESSINGS) ×4 IMPLANT
STRIP CLOSURE SKIN 1/2X4 (GAUZE/BANDAGES/DRESSINGS) ×2 IMPLANT
SUT VIC AB 4-0 SH 18 (SUTURE) ×2 IMPLANT
SYR 30ML LL (SYRINGE) ×2 IMPLANT
TAPE CLOTH SURG 4X10 WHT LF (GAUZE/BANDAGES/DRESSINGS) ×2 IMPLANT
TOWEL OR 17X26 10 PK STRL BLUE (TOWEL DISPOSABLE) ×4 IMPLANT
TRAY LAP CHOLE (CUSTOM PROCEDURE TRAY) ×2 IMPLANT
TROCAR BLADELESS OPT 5 75 (ENDOMECHANICALS) ×4 IMPLANT
TROCAR XCEL BLUNT TIP 100MML (ENDOMECHANICALS) ×2 IMPLANT
TROCAR XCEL NON-BLD 11X100MML (ENDOMECHANICALS) IMPLANT
TROCAR Z-THREAD FIOS 11X100 BL (TROCAR) ×2 IMPLANT
TROCAR Z-THREAD FIOS 5X100MM (TROCAR) IMPLANT
TUBING INSUFFLATION 10FT LAP (TUBING) ×2 IMPLANT

## 2012-01-05 NOTE — ED Provider Notes (Signed)
Medical screening examination/treatment/procedure(s) were conducted as a shared visit with non-physician practitioner(s) and myself.  I personally evaluated the patient during the encounter  Surgery to see in ED and admit for acute cholecystitis   Loren Racer, MD 01/05/12 0004

## 2012-01-05 NOTE — Anesthesia Postprocedure Evaluation (Signed)
Anesthesia Post Note  Patient: Jodi Bowman  Procedure(s) Performed: Procedure(s) (LRB): LAPAROSCOPIC CHOLECYSTECTOMY WITH INTRAOPERATIVE CHOLANGIOGRAM (N/A)  Anesthesia type: General  Patient location: PACU  Post pain: Pain level controlled  Post assessment: Post-op Vital signs reviewed  Last Vitals:  Filed Vitals:   01/05/12 1030  BP: 120/68  Pulse: 54  Temp:   Resp: 13    Post vital signs: Reviewed  Level of consciousness: sedated  Complications: No apparent anesthesia complications

## 2012-01-05 NOTE — Op Note (Signed)
[facility] @date @  Procedure: Laparoscopic Cholecystectomy with intraoperative cholangiogram  Surgeon: Wenda Low, MD, FACS Asst:  Jethro Bastos:  General  Drains: None  Findings: Acute cholecystitis with normal IOC  Description of Procedure: The patient was taken to OR 1 and given general anesthesia.  The patient was prepped with PCMX and draped sterilely. A time out was performed.  Access to the abdomen was achieved with [facility] technique.  Port placement included a 12 in the upper midline and 2 fives laterally.    The gallbladder was visualized and the fundus was grasped and the gallbladder was elevated. Traction on the infundibulum allowed for successful demonstration of the critical view. Inflammatory changes were moderate and chronic.  The cystic duct was identified and clipped up on the gallbladder and an incision was made in the cystic duct and the Reddick catheter was inserted after milking the cystic duct of any debris. A dynamic cholangiogram was performed which demonstrated good intrahepatic filling and free flow into the duodenum.    The cystic duct was then triple clipped and divided, the cystic artery was double clipped and divided and then the gallbladder was removed from the gallbladder bed. Removal of the gallbladder from the gallbladder bed was uncomplicated and no bleeding or bile leaks were seen.  The gallbladder was then placed in a bag and brought out through one of the 10 mm trocar sites. The gallbladder bed was inspected and no bleeding or bile leaks were seen.   Laparoscopic visualization was used when closing fascial defects for trocar sites.   Incisions were injected with 20 cc Exparel and closed with 4-0 Vicryl and steristrips on the skin.  Sponge and needle count were correcct.    The patient was taken to the recovery room in satisfactory condition.

## 2012-01-05 NOTE — Anesthesia Preprocedure Evaluation (Signed)
Anesthesia Evaluation  Patient identified by MRN, date of birth, ID band Patient awake    Reviewed: Allergy & Precautions, H&P , NPO status , Patient's Chart, lab work & pertinent test results, reviewed documented beta blocker date and time   Airway Mallampati: III TM Distance: >3 FB Neck ROM: Full    Dental  (+) Teeth Intact, Poor Dentition and Dental Advisory Given   Pulmonary neg pulmonary ROS, sleep apnea ,  breath sounds clear to auscultation  Pulmonary exam normal       Cardiovascular hypertension, Pt. on medications and Pt. on home beta blockers negative cardio ROS  Rhythm:Regular Rate:Normal     Neuro/Psych  Headaches, Anxiety CVA negative neurological ROS     GI/Hepatic negative GI ROS, Neg liver ROS,   Endo/Other  Morbid obesityThyroid goiter  Renal/GU negative Renal ROS  negative genitourinary   Musculoskeletal negative musculoskeletal ROS (+)   Abdominal (+) + obese,   Peds  Hematology negative hematology ROS (+)   Anesthesia Other Findings   Reproductive/Obstetrics negative OB ROS                           Anesthesia Physical Anesthesia Plan  ASA: III  Anesthesia Plan: General   Post-op Pain Management:    Induction: Intravenous  Airway Management Planned: Oral ETT  Additional Equipment:   Intra-op Plan:   Post-operative Plan: Extubation in OR  Informed Consent: I have reviewed the patients History and Physical, chart, labs and discussed the procedure including the risks, benefits and alternatives for the proposed anesthesia with the patient or authorized representative who has indicated his/her understanding and acceptance.   Dental advisory given  Plan Discussed with: CRNA  Anesthesia Plan Comments:         Anesthesia Quick Evaluation

## 2012-01-05 NOTE — Transfer of Care (Signed)
Immediate Anesthesia Transfer of Care Note  Patient: Jodi Bowman  Procedure(s) Performed: Procedure(s) (LRB): LAPAROSCOPIC CHOLECYSTECTOMY WITH INTRAOPERATIVE CHOLANGIOGRAM (N/A)  Patient Location: PACU  Anesthesia Type: General  Level of Consciousness: awake, alert  and oriented  Airway & Oxygen Therapy: Patient Spontanous Breathing and Patient connected to face mask oxygen  Post-op Assessment: Report given to PACU RN and Post -op Vital signs reviewed and stable  Post vital signs: Reviewed and stable  Complications: No apparent anesthesia complications

## 2012-01-06 LAB — COMPREHENSIVE METABOLIC PANEL
ALT: 223 U/L — ABNORMAL HIGH (ref 0–35)
AST: 82 U/L — ABNORMAL HIGH (ref 0–37)
Albumin: 2.9 g/dL — ABNORMAL LOW (ref 3.5–5.2)
Alkaline Phosphatase: 123 U/L — ABNORMAL HIGH (ref 39–117)
BUN: 5 mg/dL — ABNORMAL LOW (ref 6–23)
CO2: 29 mEq/L (ref 19–32)
Calcium: 8.6 mg/dL (ref 8.4–10.5)
Chloride: 104 mEq/L (ref 96–112)
Creatinine, Ser: 0.9 mg/dL (ref 0.50–1.10)
GFR calc Af Amer: 84 mL/min — ABNORMAL LOW (ref >90)
GFR calc non Af Amer: 72 mL/min — ABNORMAL LOW (ref >90)
Glucose, Bld: 100 mg/dL — ABNORMAL HIGH (ref 70–99)
Potassium: 3.9 mEq/L (ref 3.5–5.1)
Sodium: 138 mEq/L (ref 135–145)
Total Bilirubin: 0.5 mg/dL (ref 0.3–1.2)
Total Protein: 6.3 g/dL (ref 6.0–8.3)

## 2012-01-06 LAB — CBC
HCT: 37.8 % (ref 36.0–46.0)
Hemoglobin: 12.1 g/dL (ref 12.0–15.0)
MCH: 28.3 pg (ref 26.0–34.0)
MCHC: 32 g/dL (ref 30.0–36.0)
MCV: 88.3 fL (ref 78.0–100.0)
Platelets: 253 10*3/uL (ref 150–400)
RBC: 4.28 MIL/uL (ref 3.87–5.11)
RDW: 15.6 % — ABNORMAL HIGH (ref 11.5–15.5)
WBC: 11.1 10*3/uL — ABNORMAL HIGH (ref 4.0–10.5)

## 2012-01-06 MED ORDER — OXYCODONE-ACETAMINOPHEN 5-325 MG PO TABS: 1.0000 | ORAL_TABLET | ORAL | Status: AC | PRN

## 2012-01-06 NOTE — Discharge Instructions (Signed)

## 2012-01-06 NOTE — Discharge Summary (Signed)
Physician Discharge Summary  Patient ID: Jodi Bowman MRN: [id number] DOB/AGE: 12-06-1960 51 y.o.  Admit date: 01/04/2012 Discharge date: 01/06/2012  Admission Diagnoses:  cholecystitis  Discharge Diagnoses:  same  Active Problems:  * No active hospital problems. *    Surgery:  Lap cholecystectomy  Discharged Condition: improved  Hospital Course:   Admitted night prior to previously scheduled surgery  Consults: none  Significant Diagnostic Studies: none    Discharge Exam: Blood pressure 102/69, pulse 75, temperature 98.4 F (36.9 C), temperature source Oral, resp. rate 16, height 4\' 11"  (1.499 m), weight 274 lb 8 oz (124.512 kg), SpO2 97.00%. Incisions OK  Disposition: 01-Home or Self Care  Discharge Orders    Future Appointments: Provider: Department: Dept Phone: Center:   01/29/2012 9:00 AM Valarie Merino, MD [facility] [phone] None     Future Orders Please Complete By Expires   Diet general      Increase activity slowly      Discharge instructions      Comments:   followup in the office in 3-4 weeks   No dressing needed        Medication List  As of 01/06/2012 11:02 AM   STOP taking these medications         ondansetron 8 MG tablet      traMADol 50 MG tablet         TAKE these medications         amLODipine 5 MG tablet   Commonly known as: NORVASC   Take 5 mg by mouth every morning.      aspirin 81 MG chewable tablet   Chew 81 mg by mouth daily.      lisinopril-hydrochlorothiazide 20-25 MG per tablet   Commonly known as: PRINZIDE,ZESTORETIC   Take 1 tablet by mouth every morning.      loratadine 10 MG tablet   Commonly known as: CLARITIN   Take 10 mg by mouth every morning.      metoprolol succinate 50 MG 24 hr tablet   Commonly known as: TOPROL-XL   Take 50 mg by mouth every morning. Take with or immediately following a meal.      oxyCODONE-acetaminophen 5-325 MG per tablet   Commonly known as: PERCOCET   Take 1-2 tablets by  mouth every 4 (four) hours as needed.      pravastatin 40 MG tablet   Commonly known as: PRAVACHOL   Take 40 mg by mouth at bedtime.           Follow-up Information    Follow up with Urania Pearlman B, MD in 4 weeks.   Contact information:   [employer] [address] [place] [address] [phone]          Signed: Valarie Merino 01/06/2012, 11:02 AM

## 2012-01-06 NOTE — Progress Notes (Signed)
Pt given d/c instructions with family at bedside. Instructions given about meds, prescriptions, wound care, when to call MD, f/u appt, s/s infection.  Belongings packed, cell phones packed.  Stable at time of d/c.

## 2012-01-20 ENCOUNTER — Encounter (HOSPITAL_COMMUNITY): Payer: Self-pay | Admitting: Surgery

## 2012-01-29 ENCOUNTER — Ambulatory Visit (INDEPENDENT_AMBULATORY_CARE_PROVIDER_SITE_OTHER): Payer: PRIVATE HEALTH INSURANCE | Admitting: Surgery

## 2012-01-29 ENCOUNTER — Encounter (INDEPENDENT_AMBULATORY_CARE_PROVIDER_SITE_OTHER): Payer: Self-pay | Admitting: Surgery

## 2012-01-29 VITALS — BP 134/88 | HR 72 | Temp 97.3°F | Resp 16 | Ht 59.0 in | Wt 274.5 lb

## 2012-01-29 DIAGNOSIS — Z9089 Acquired absence of other organs: Secondary | ICD-10-CM

## 2012-01-29 DIAGNOSIS — Z9049 Acquired absence of other specified parts of digestive tract: Secondary | ICD-10-CM | POA: Insufficient documentation

## 2012-01-29 NOTE — Progress Notes (Signed)
Jodi Bowman 51 y.o.  Body mass index is 55.44 kg/(m^2).  Patient Active Problem List  Diagnoses  . LIPOMA  . GLUCOSE INTOLERANCE  . DYSLIPIDEMIA  . ANXIETY STATE NOS  . MIGRAINE HEADACHE  . HYPERTENSION  . CAROTID ARTERY STENOSIS, BILATERAL  . CEREBROVASCULAR ACCIDENT  . PHARYNGITIS  . ALLERGIC RHINITIS  . LOW BACK PAIN  . BACK PAIN  . HEADACHE  . ANKLE INJURY, RIGHT  . Gallstones  . Thyroid nodule-non neoplastic goiter by needle aspiration    Allergies  Allergen Reactions  . Naproxen Other (See Comments)    Makes stomach cramp badly.    Past Surgical History  Procedure Date  . Knee arthroscopy one 1995 and 1 in 1997    both knees done  . Abdominal hysterectomy   . Cesarean section yrs ago    done x 2  . Surgery for endometriosis yrs ago  . Thryoid biopsy December 01, 2011     at [facility]  . Cholecystectomy 01/05/2012    Procedure: LAPAROSCOPIC CHOLECYSTECTOMY WITH INTRAOPERATIVE CHOLANGIOGRAM;  Surgeon: Valarie Merino, MD;  Location: [facility];  Service: General;  Laterality: N/A;   Yisroel Ramming, MD, MD No diagnosis found.  Doing well.  Incisions healing.  No complaints.  Return prn Matt B. Daphine Deutscher, MD, [facility] [phone] beeper [phone]  01/29/2012 9:02 AM

## 2012-01-29 NOTE — Patient Instructions (Signed)
Return if needed

## 2012-02-12 IMAGING — CR DG HIP (WITH OR WITHOUT PELVIS) 2-3V*L*
3 series · 3 of 3 positions shown · non-contrast
Comparison: None.

CLINICAL DATA: Pain in left hip since [REDACTED].

LEFT HIP - COMPLETE 2+ VIEW

[t pelvis a.p.]
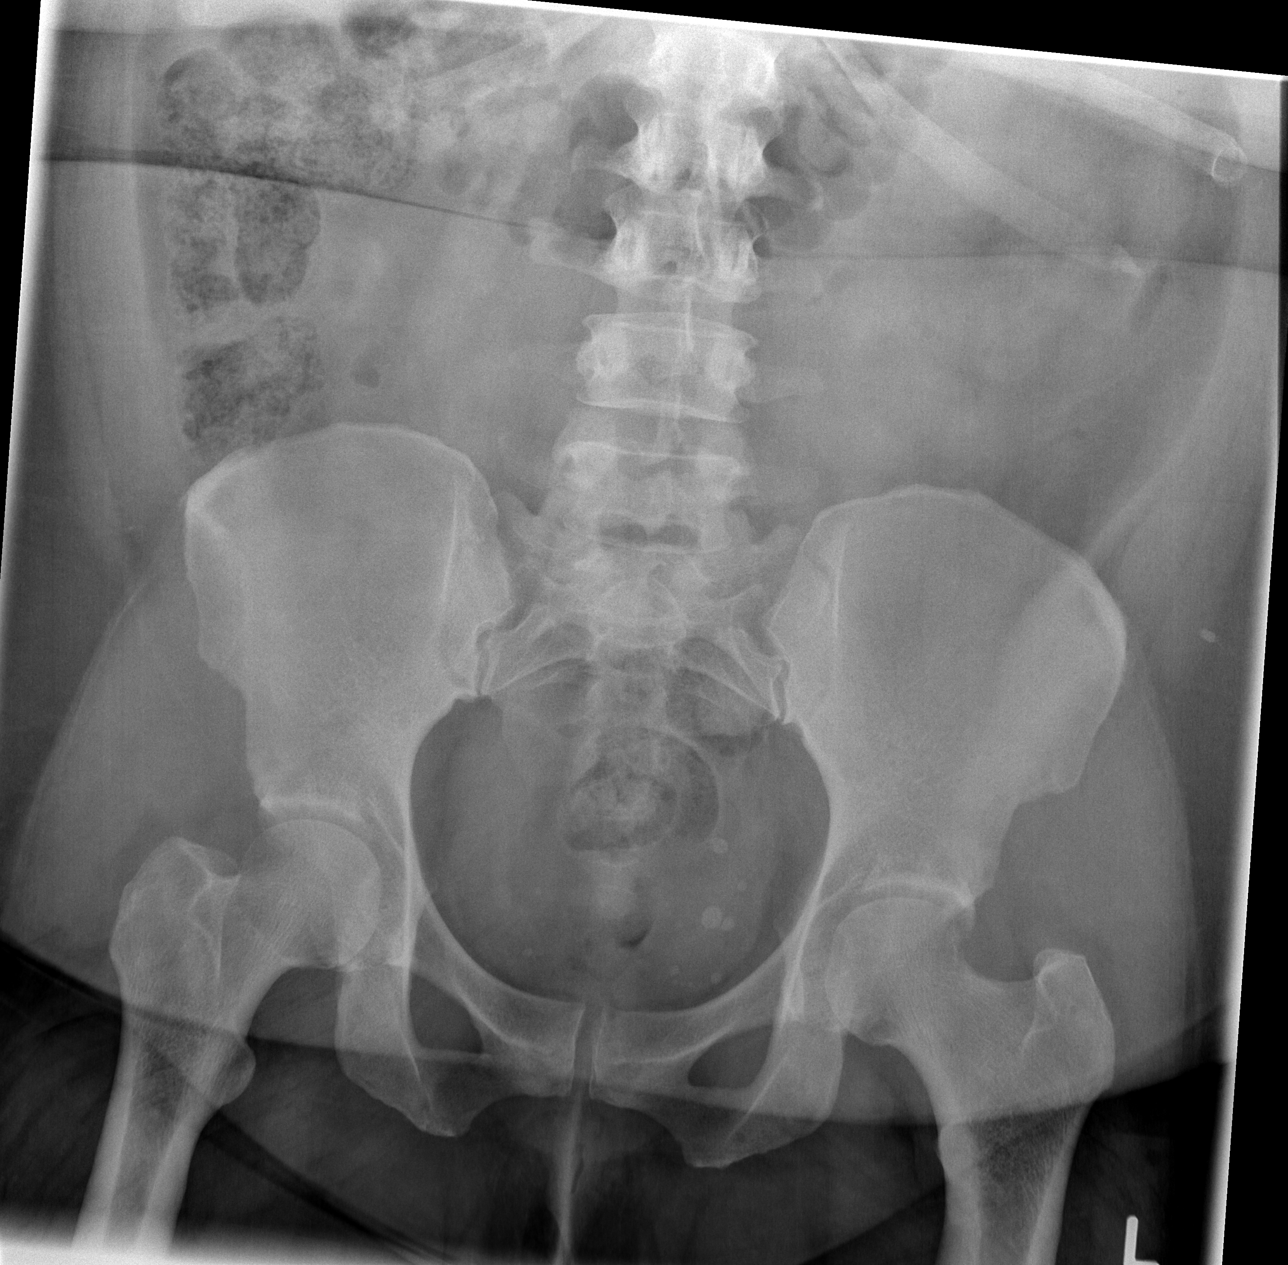

[t hip ap left]
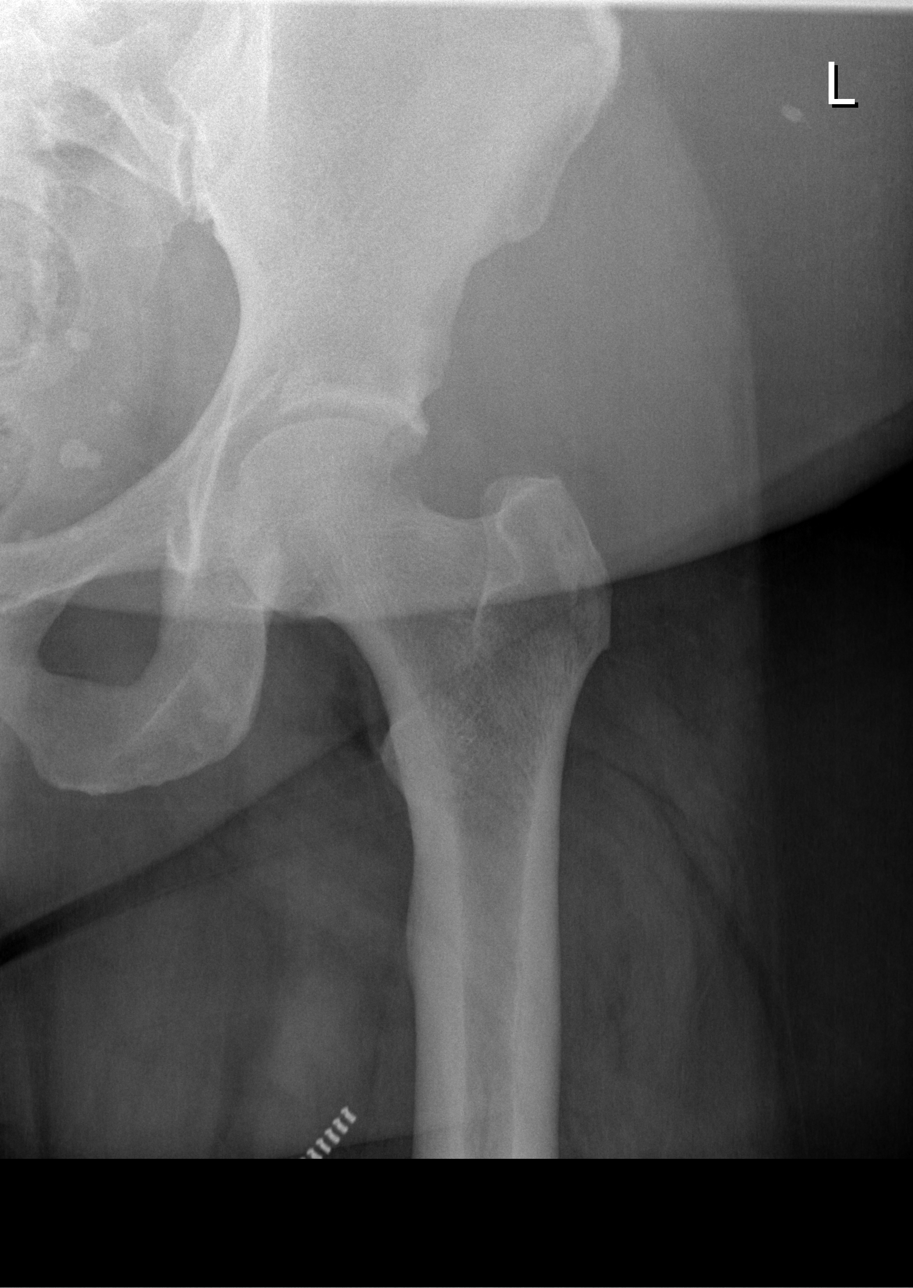

[t hip frog leg left]
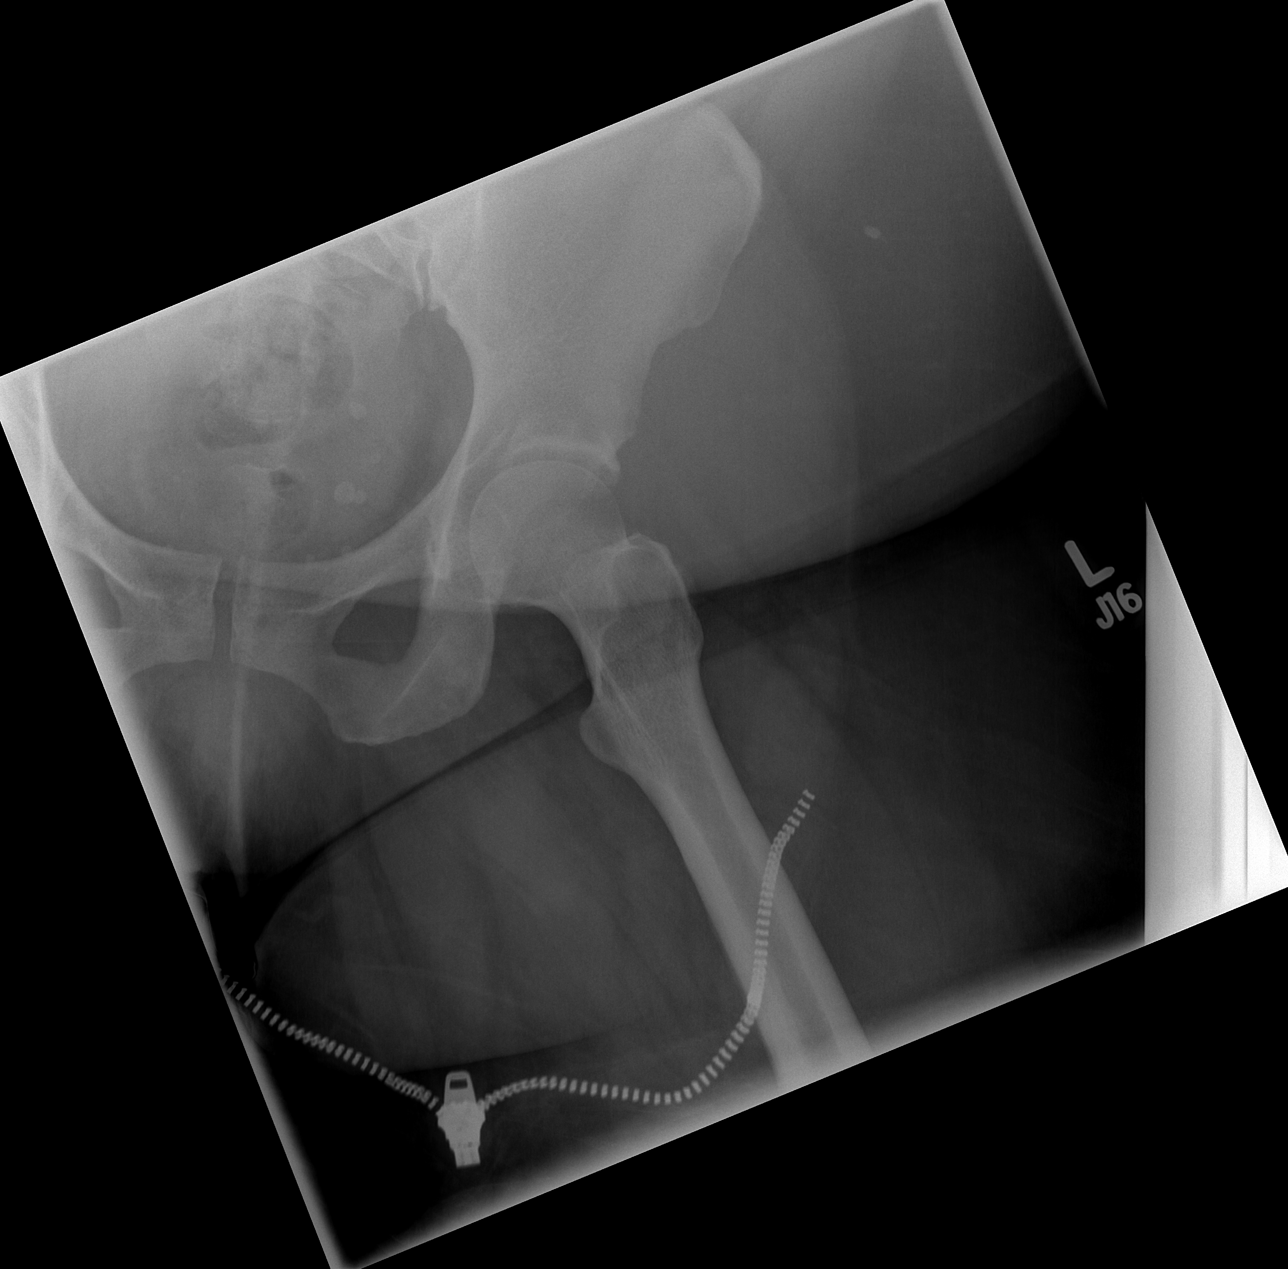

[3 of 3 positions shown; findings below may reference images not displayed]

FINDINGS: Both femoral heads are located.  Sacroiliac joints are
symmetric.  There is mild bilateral hip osteoarthritis.  No acute
fracture.
IMPRESSION: Bilateral hip osteoarthritis. No acute findings.

## 2012-04-28 IMAGING — CR DG KNEE COMPLETE 4+V*R*
4 series · 4 of 4 positions shown · non-contrast
Comparison: None.

CLINICAL DATA: Fall, knee pain.

RIGHT KNEE - COMPLETE 4+ VIEW

[t knee ap right]
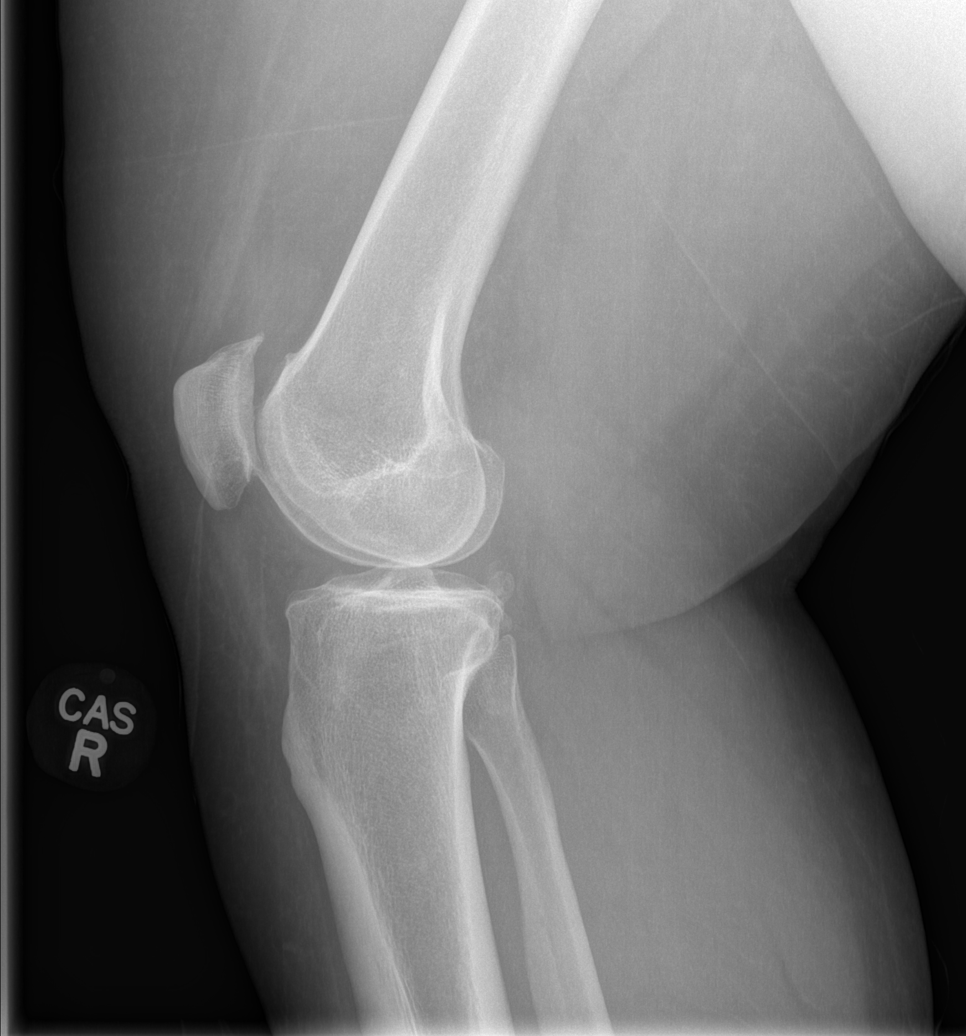

[t knee oblique right (1 of 2)]
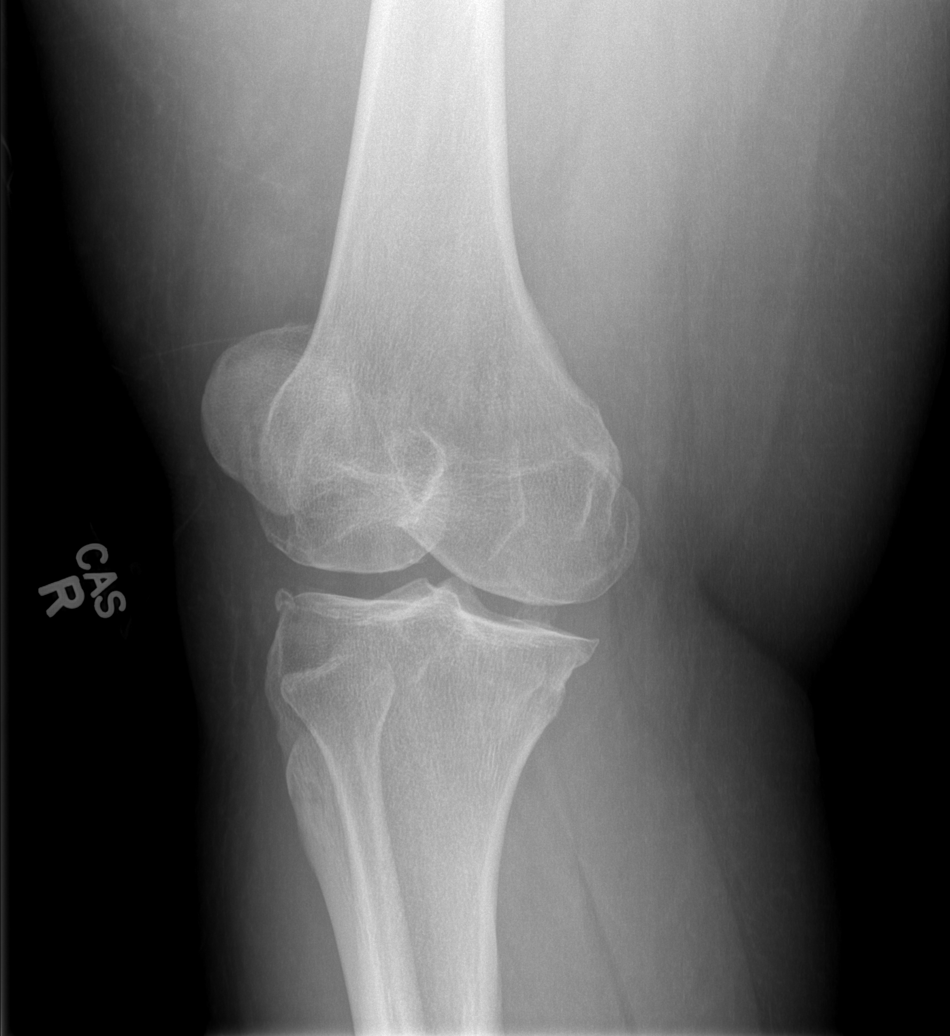

[t knee oblique right (2 of 2)]
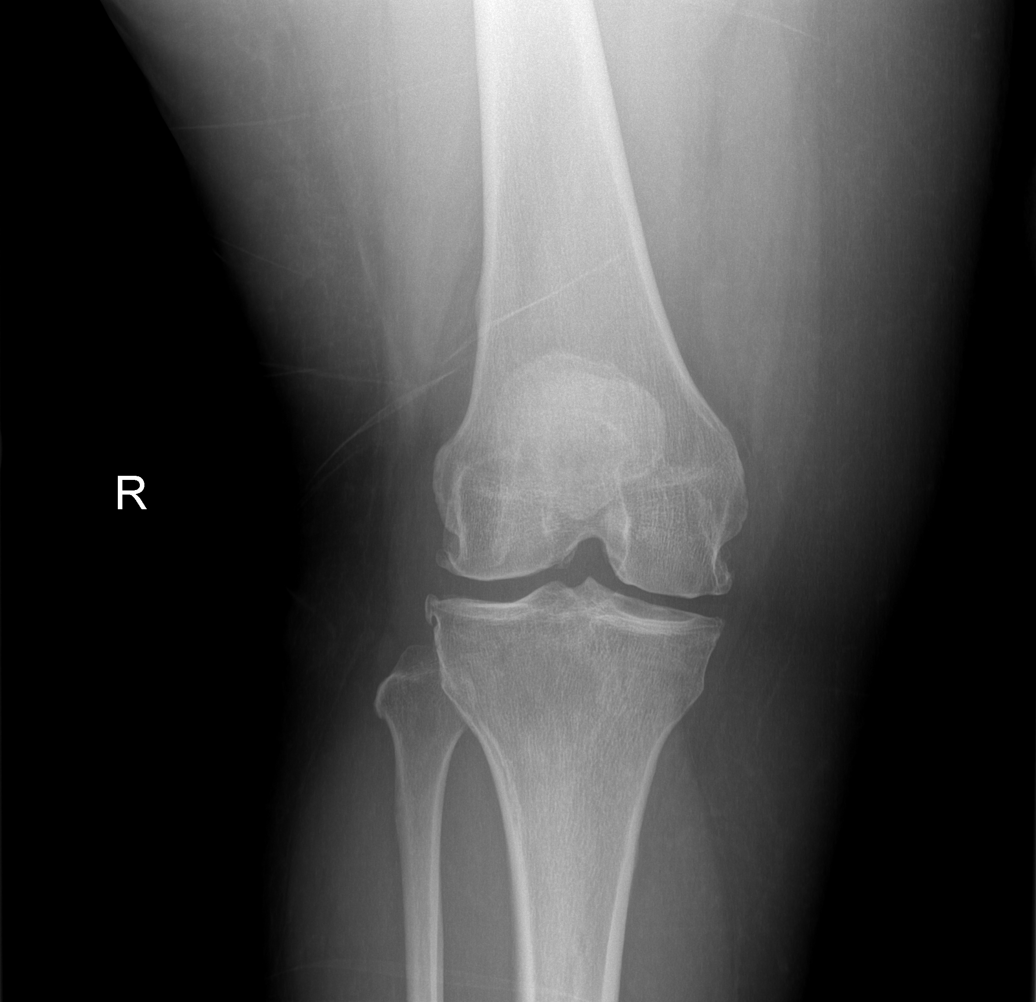

[t knee lat right]
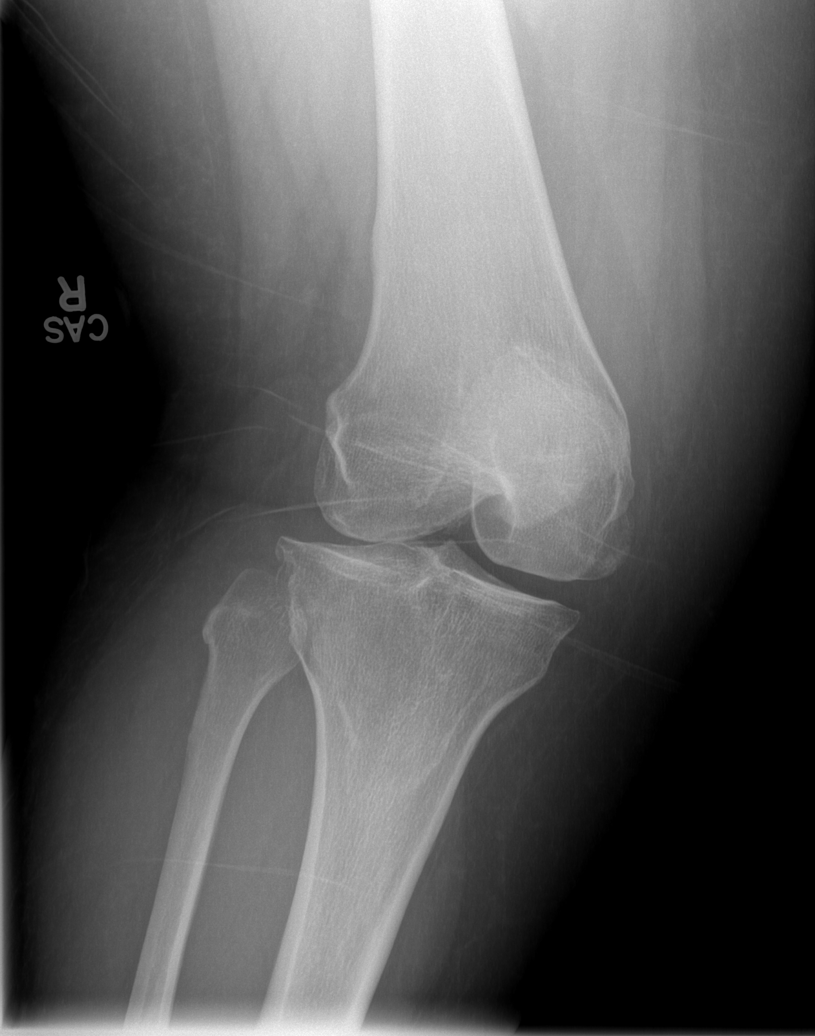

[4 of 4 positions shown; findings below may reference images not displayed]

FINDINGS: Degenerative joint disease changes with in the right knee
involving all three compartments. No acute bony abnormality.
Specifically, no fracture, subluxation, or dislocation.  Soft
tissues are intact.  No joint effusion.
IMPRESSION: Moderate degenerative changes.  No acute findings.

## 2012-05-10 ENCOUNTER — Emergency Department (HOSPITAL_COMMUNITY): Payer: Medicaid Other

## 2012-05-10 ENCOUNTER — Emergency Department (HOSPITAL_COMMUNITY)
Admission: EM | Admit: 2012-05-10 | Discharge: 2012-05-10 | Disposition: A | Discharge status: Home / Self Care | Payer: Medicaid Other | Attending: Emergency Medicine | Admitting: Emergency Medicine

## 2012-05-10 ENCOUNTER — Encounter (HOSPITAL_COMMUNITY): Payer: Self-pay | Admitting: Adult Health

## 2012-05-10 DIAGNOSIS — I10 Essential (primary) hypertension: Secondary | ICD-10-CM | POA: Insufficient documentation

## 2012-05-10 DIAGNOSIS — Z79899 Other long term (current) drug therapy: Secondary | ICD-10-CM | POA: Insufficient documentation

## 2012-05-10 DIAGNOSIS — E7800 Pure hypercholesterolemia, unspecified: Secondary | ICD-10-CM | POA: Insufficient documentation

## 2012-05-10 DIAGNOSIS — M25569 Pain in unspecified knee: Secondary | ICD-10-CM | POA: Insufficient documentation

## 2012-05-10 DIAGNOSIS — Z8739 Personal history of other diseases of the musculoskeletal system and connective tissue: Secondary | ICD-10-CM | POA: Insufficient documentation

## 2012-05-10 DIAGNOSIS — Z8673 Personal history of transient ischemic attack (TIA), and cerebral infarction without residual deficits: Secondary | ICD-10-CM | POA: Insufficient documentation

## 2012-05-10 MED ORDER — HYDROCODONE-ACETAMINOPHEN 5-325 MG PO TABS: 1.0000 | ORAL_TABLET | Freq: Four times a day (QID) | ORAL | Status: AC | PRN

## 2012-05-10 MED ORDER — OXYCODONE-ACETAMINOPHEN 5-325 MG PO TABS
1.0000 | ORAL_TABLET | Freq: Once | ORAL | Status: AC
  Administered 2012-05-10: 1 via ORAL
  Filled 2012-05-10: qty 1

## 2012-05-10 NOTE — ED Provider Notes (Signed)
History/physical exam/procedure(s) were performed by non-physician practitioner and as supervising physician I was immediately available for consultation/collaboration. I have reviewed all notes and am in agreement with care and plan.   Hilario Quarry, MD 05/10/12 2231

## 2012-05-10 NOTE — ED Notes (Addendum)
Pt c/o back pain that feels like there are needles sticking in it and left leg pain that began yesterday. Pt states she has been able to walk however her legs gave out on her yesterday. Pain is located in left leg below the knee that is described as throbbing. No deformity, CMS intact.  No redness or edema. Denies injury

## 2012-05-10 NOTE — Progress Notes (Signed)
Orthopedic Tech Progress Note Patient Details:  Jodi Bowman 1961/03/02 [id number]  Ortho Devices Type of Ortho Device: Knee Immobilizer Ortho Device/Splint Location: left knee Ortho Device/Splint Interventions: Application   Nikki Dom 05/10/2012, 10:49 PM

## 2012-05-10 NOTE — ED Provider Notes (Signed)
History     CSN: [id number]  Arrival date & time 05/10/12  1952   First MD Initiated Contact with Patient 05/10/12 2032      Chief Complaint  Patient presents with  . Leg Pain    (Consider location/radiation/quality/duration/timing/severity/associated sxs/prior treatment) HPI Comments: Patient with a history of arthroscopic knee surgery bilaterally presents emergency department with a chief complaint of left leg pain.  Onset of symptoms began yesterday afternoon after the patient's "knee gave out" causing her to fall.  Pain is described as a throbbing sensation just below the knee rated at a 8/10 in severity without radiation.  Pain exacerbated by ambulation and relieved by nothing. Past surgeries performed by [employer]. Pt denies numbness or tingling of extremities, weakness, hip pain, back pain, loss control of bowel or bladder, hitting her head during the fall, loss of consciousness, syncope, or ankle pain.  Patient is a 51 y.o. female presenting with leg pain. The history is provided by the patient.  Leg Pain     Past Medical History  Diagnosis Date  . Hypertension   . Hypercholesteremia   . Lower back pain   . Nontoxic uninodular goiter     sees dr vollmer at [facility]  . Calculus of gallbladder without mention of cholecystitis or obstruction   . Arthritis   . Stroke 2000  . Migraine   . Sleep apnea     STOPBANG=5    Past Surgical History  Procedure Date  . Knee arthroscopy one 1995 and 1 in 1997    both knees done  . Abdominal hysterectomy   . Cesarean section yrs ago    done x 2  . Surgery for endometriosis yrs ago  . Thryoid biopsy December 01, 2011     [facility]  . Cholecystectomy 01/05/2012    Procedure: LAPAROSCOPIC CHOLECYSTECTOMY WITH INTRAOPERATIVE CHOLANGIOGRAM;  Surgeon: Valarie Merino, MD;  Location: [facility];  Service: General;  Laterality: N/A;    Family History  Problem Relation Age of Onset  . Cancer Brother     colon and lung  . Cancer  Maternal Grandmother     colon    History  Substance Use Topics  . Smoking status: Never Smoker   . Smokeless tobacco: Never Used  . Alcohol Use: No    OB History    Grav Para Term Preterm Abortions TAB SAB Ect Mult Living                  Review of Systems  Constitutional: Negative for fever, diaphoresis and activity change.  HENT: Negative for congestion and neck pain.   Respiratory: Negative for cough.   Genitourinary: Negative for dysuria.  Musculoskeletal: Positive for joint swelling and gait problem. Negative for myalgias.  Skin: Negative for color change and wound.  Neurological: Negative for headaches.  All other systems reviewed and are negative.    Allergies  Naproxen  Home Medications   Current Outpatient Rx  Name Route Sig Dispense Refill  . AMLODIPINE BESYLATE 5 MG PO TABS Oral Take 5 mg by mouth every morning.     . ASPIRIN 81 MG PO CHEW Oral Chew 81 mg by mouth daily.     Marland Kitchen LISINOPRIL-HYDROCHLOROTHIAZIDE 20-25 MG PO TABS Oral Take 1 tablet by mouth every morning.     Marland Kitchen LORATADINE 10 MG PO TABS Oral Take 10 mg by mouth every morning.     Marland Kitchen METOPROLOL SUCCINATE ER 50 MG PO TB24 Oral Take 50 mg by  mouth every morning. Take with or immediately following a meal.    . PRAVASTATIN SODIUM 40 MG PO TABS Oral Take 40 mg by mouth at bedtime.       BP 182/110  Pulse 105  Temp 98.3 F (36.8 C) (Oral)  Resp 18  SpO2 100%  Physical Exam  Nursing note and vitals reviewed. Constitutional: She is oriented to person, place, and time. She appears well-developed and well-nourished. No distress.  HENT:  Head: Normocephalic and atraumatic.  Eyes: Conjunctivae and EOM are normal.  Neck: Normal range of motion.  Cardiovascular:       Intact distal pulses, no pitting edema present.  Pulmonary/Chest: Effort normal.  Musculoskeletal: Normal range of motion.       Left lower extremity with tenderness to palpation inferior to the patella.  No medial or lateral  tenderness.  Pain free range of motion.  Negative anterior drawer sign No pain with internal or external rotation of hips bilaterally.  Neurological: She is alert and oriented to person, place, and time.  Skin: Skin is warm and dry. No rash noted. She is not diaphoretic.       No warmth swelling or erythema of extremity.  Psychiatric: She has a normal mood and affect. Her behavior is normal.    ED Course  Procedures (including critical care time)  Labs Reviewed - No data to display Dg Tibia/fibula Left  05/10/2012  *RADIOLOGY REPORT*  Clinical Data: Status post fall.  Pain.  LEFT TIBIA AND FIBULA - 2 VIEW  Comparison: None.  Findings: There is no acute bony or joint abnormality.  The patient has some degenerative change about the knee.  Soft tissue structures are unremarkable.  IMPRESSION: No acute finding.  Original Report Authenticated By: Bernadene Bell. Maricela Curet, M.D.     No diagnosis found.    MDM  Knee pain  Pain managed in ED. Pt advised to follow up with orthopedics for possible re\re tear of ligaments.  Patient given brace while in ED, but refused crutches stating she has at home.  Conservative therapy recommended and discussed. Patient will be dc home & is agreeable with above plan.         Jaci Carrel, [place] 05/10/12 2222

## 2012-05-30 ENCOUNTER — Encounter (HOSPITAL_COMMUNITY): Payer: Self-pay | Admitting: Nurse Practitioner

## 2012-05-30 ENCOUNTER — Emergency Department (HOSPITAL_COMMUNITY): Payer: Medicaid Other

## 2012-05-30 ENCOUNTER — Emergency Department (HOSPITAL_COMMUNITY)
Admission: EM | Admit: 2012-05-30 | Discharge: 2012-05-30 | Disposition: A | Discharge status: Home / Self Care | Payer: Medicaid Other | Attending: Emergency Medicine | Admitting: Emergency Medicine

## 2012-05-30 DIAGNOSIS — T148XXA Other injury of unspecified body region, initial encounter: Secondary | ICD-10-CM

## 2012-05-30 DIAGNOSIS — M25559 Pain in unspecified hip: Secondary | ICD-10-CM | POA: Insufficient documentation

## 2012-05-30 DIAGNOSIS — I10 Essential (primary) hypertension: Secondary | ICD-10-CM | POA: Insufficient documentation

## 2012-05-30 DIAGNOSIS — G4730 Sleep apnea, unspecified: Secondary | ICD-10-CM | POA: Insufficient documentation

## 2012-05-30 DIAGNOSIS — M129 Arthropathy, unspecified: Secondary | ICD-10-CM | POA: Insufficient documentation

## 2012-05-30 DIAGNOSIS — E7800 Pure hypercholesterolemia, unspecified: Secondary | ICD-10-CM | POA: Insufficient documentation

## 2012-05-30 DIAGNOSIS — Z8673 Personal history of transient ischemic attack (TIA), and cerebral infarction without residual deficits: Secondary | ICD-10-CM | POA: Insufficient documentation

## 2012-05-30 MED ORDER — IBUPROFEN 600 MG PO TABS: 600.0000 mg | ORAL_TABLET | Freq: Four times a day (QID) | ORAL | Status: AC | PRN

## 2012-05-30 MED ORDER — OXYCODONE-ACETAMINOPHEN 5-325 MG PO TABS: 1.0000 | ORAL_TABLET | ORAL | Status: AC | PRN

## 2012-05-30 MED ORDER — RANITIDINE HCL 150 MG PO TABS: 300.0000 mg | ORAL_TABLET | Freq: Two times a day (BID) | ORAL | Status: DC

## 2012-05-30 NOTE — ED Provider Notes (Signed)
History   This chart was scribed for Derwood Kaplan, MD by Melba Coon. The patient was seen in room TR04C/TR04C and the patient's care was started at 6:06PM.    CSN: [id number]  Arrival date & time 05/30/12  1408   None     Chief Complaint  Patient presents with  . Hip Pain    (Consider location/radiation/quality/duration/timing/severity/associated sxs/prior treatment) The history is provided by the patient. No language interpreter was used.   Jodi Bowman is a 51 y.o. female who presents to the Emergency Department complaining of constant, moderate to severe, left hip pain with an onset 2.5 weeks ago. Pt states that she was here at the ED for a fall before onset but left leg imaging was unremarkable. After the ED visit, pt started to c/o hip pain. Ambulation with pain present and is decreased compared to baseline. Walking and sitting aggravates the pain. No HA, fever, neck pain, sore throat, rash, back pain, CP, SOB, abd pain, n/v/d, dysuria, or extremity edema, weakness, numbness, or tingling. Hx of HTN. No other pertinent medical symptoms.  Past Medical History  Diagnosis Date  . Hypertension   . Hypercholesteremia   . Lower back pain   . Nontoxic uninodular goiter     sees dr vollmer at [facility]  . Calculus of gallbladder without mention of cholecystitis or obstruction   . Arthritis   . Stroke 2000  . Migraine   . Sleep apnea     STOPBANG=5    Past Surgical History  Procedure Date  . Knee arthroscopy one 1995 and 1 in 1997    both knees done  . Abdominal hysterectomy   . Cesarean section yrs ago    done x 2  . Surgery for endometriosis yrs ago  . Thryoid biopsy December 01, 2011     at [facility]  . Cholecystectomy 01/05/2012    Procedure: LAPAROSCOPIC CHOLECYSTECTOMY WITH INTRAOPERATIVE CHOLANGIOGRAM;  Surgeon: Valarie Merino, MD;  Location: [facility];  Service: General;  Laterality: N/A;    Family History  Problem Relation Age of Onset  . Cancer Brother     colon  and lung  . Cancer Maternal Grandmother     colon    History  Substance Use Topics  . Smoking status: Never Smoker   . Smokeless tobacco: Never Used  . Alcohol Use: No    OB History    Grav Para Term Preterm Abortions TAB SAB Ect Mult Living                  Review of Systems 10 Systems reviewed and all are negative for acute change except as noted in the HPI.   Allergies  Naproxen  Home Medications   Current Outpatient Rx  Name Route Sig Dispense Refill  . AMLODIPINE BESYLATE 5 MG PO TABS Oral Take 5 mg by mouth at bedtime.     . ASPIRIN 81 MG PO CHEW Oral Chew 81 mg by mouth every morning.    Marland Kitchen LISINOPRIL-HYDROCHLOROTHIAZIDE 20-25 MG PO TABS Oral Take 1 tablet by mouth every morning.     Marland Kitchen LORATADINE 10 MG PO TABS Oral Take 10 mg by mouth every morning.     Marland Kitchen METOPROLOL SUCCINATE ER 50 MG PO TB24 Oral Take 50 mg by mouth at bedtime. Take with or immediately following a meal.    . PRAVASTATIN SODIUM 40 MG PO TABS Oral Take 40 mg by mouth at bedtime.       BP 184/87  Pulse 77  Temp 98.4 F (36.9 C) (Oral)  Resp 22  SpO2 99%  Physical Exam  Nursing note and vitals reviewed. Constitutional: She is oriented to person, place, and time. She appears well-developed and well-nourished.  HENT:  Head: Normocephalic and atraumatic.  Eyes: EOM are normal. Pupils are equal, round, and reactive to light.  Neck: Normal range of motion. Neck supple.  Cardiovascular: Normal rate, normal heart sounds and intact distal pulses.   Pulmonary/Chest: Effort normal and breath sounds normal.  Abdominal: Bowel sounds are normal. She exhibits no distension. There is no tenderness.  Musculoskeletal: Normal range of motion. She exhibits tenderness (Moderate lateral left hip TTP with no ecchymosis; posterior hip and posterior pelvic TTP; positive hip tenderness with active and passive hip flexion; positive hip tenderness with internal rotation; significant positive hip tenderness with external  rotatio). She exhibits no edema.  Neurological: She is alert and oriented to person, place, and time. She has normal strength. No cranial nerve deficit or sensory deficit.  Skin: Skin is warm and dry. No rash noted.  Psychiatric: She has a normal mood and affect.    ED Course  Procedures (including critical care time)  DIAGNOSTIC STUDIES: Oxygen Saturation is 99% on room air, normal by my interpretation.    COORDINATION OF CARE:  6:15PM - left hip imaging results reviewed; results are unremarkable. Pt will be Rx motrin and percocet. Pt ready for d/c.   Labs Reviewed - No data to display Dg Hip Complete Left  05/30/2012  *RADIOLOGY REPORT*  Clinical Data: Left hip pain since a fall on 05/11/2012.  LEFT HIP - COMPLETE 2+ VIEW  Comparison: None.  Findings: Normal appearing left hip, pelvic bones and lower lumbar spine.  No fracture or dislocation seen.  IMPRESSION: No fracture or dislocation.   Original Report Authenticated By: Darrol Angel, M.D.      No diagnosis found.    MDM  Pt comes in with cc of hip pain. Pt had a fall 2 weeks ago  -since then having pain. Exam consistent with right hip related pain, and there is reproducibility with hip rotations. Xray is normal - no fractures and no dislocations. Likely contusion or soft tissue injury from the fall,. RICE treatment advocated, and pcp f/u if the sx dont improve advised.  Medical screening examination/treatment/procedure(s) were performed by me as the supervising physician. Scribe service was utilized for documentation only.      Derwood Kaplan, MD 05/31/12 1444

## 2012-05-30 NOTE — ED Notes (Addendum)
Pt states fell on 8/13 and has been having L hip pain since. Pt reports she was seen here the day of the fall but hip was not evaluated because she only had leg pain at the time. Ambualtory, cms intact

## 2012-06-14 IMAGING — CR DG PELVIS 1-2V
1 series · 1 of 1 positions shown · non-contrast
Comparison: None

CLINICAL DATA: Fall, pain.

PELVIS - 1-2 VIEW

[t pelvis a.p.]
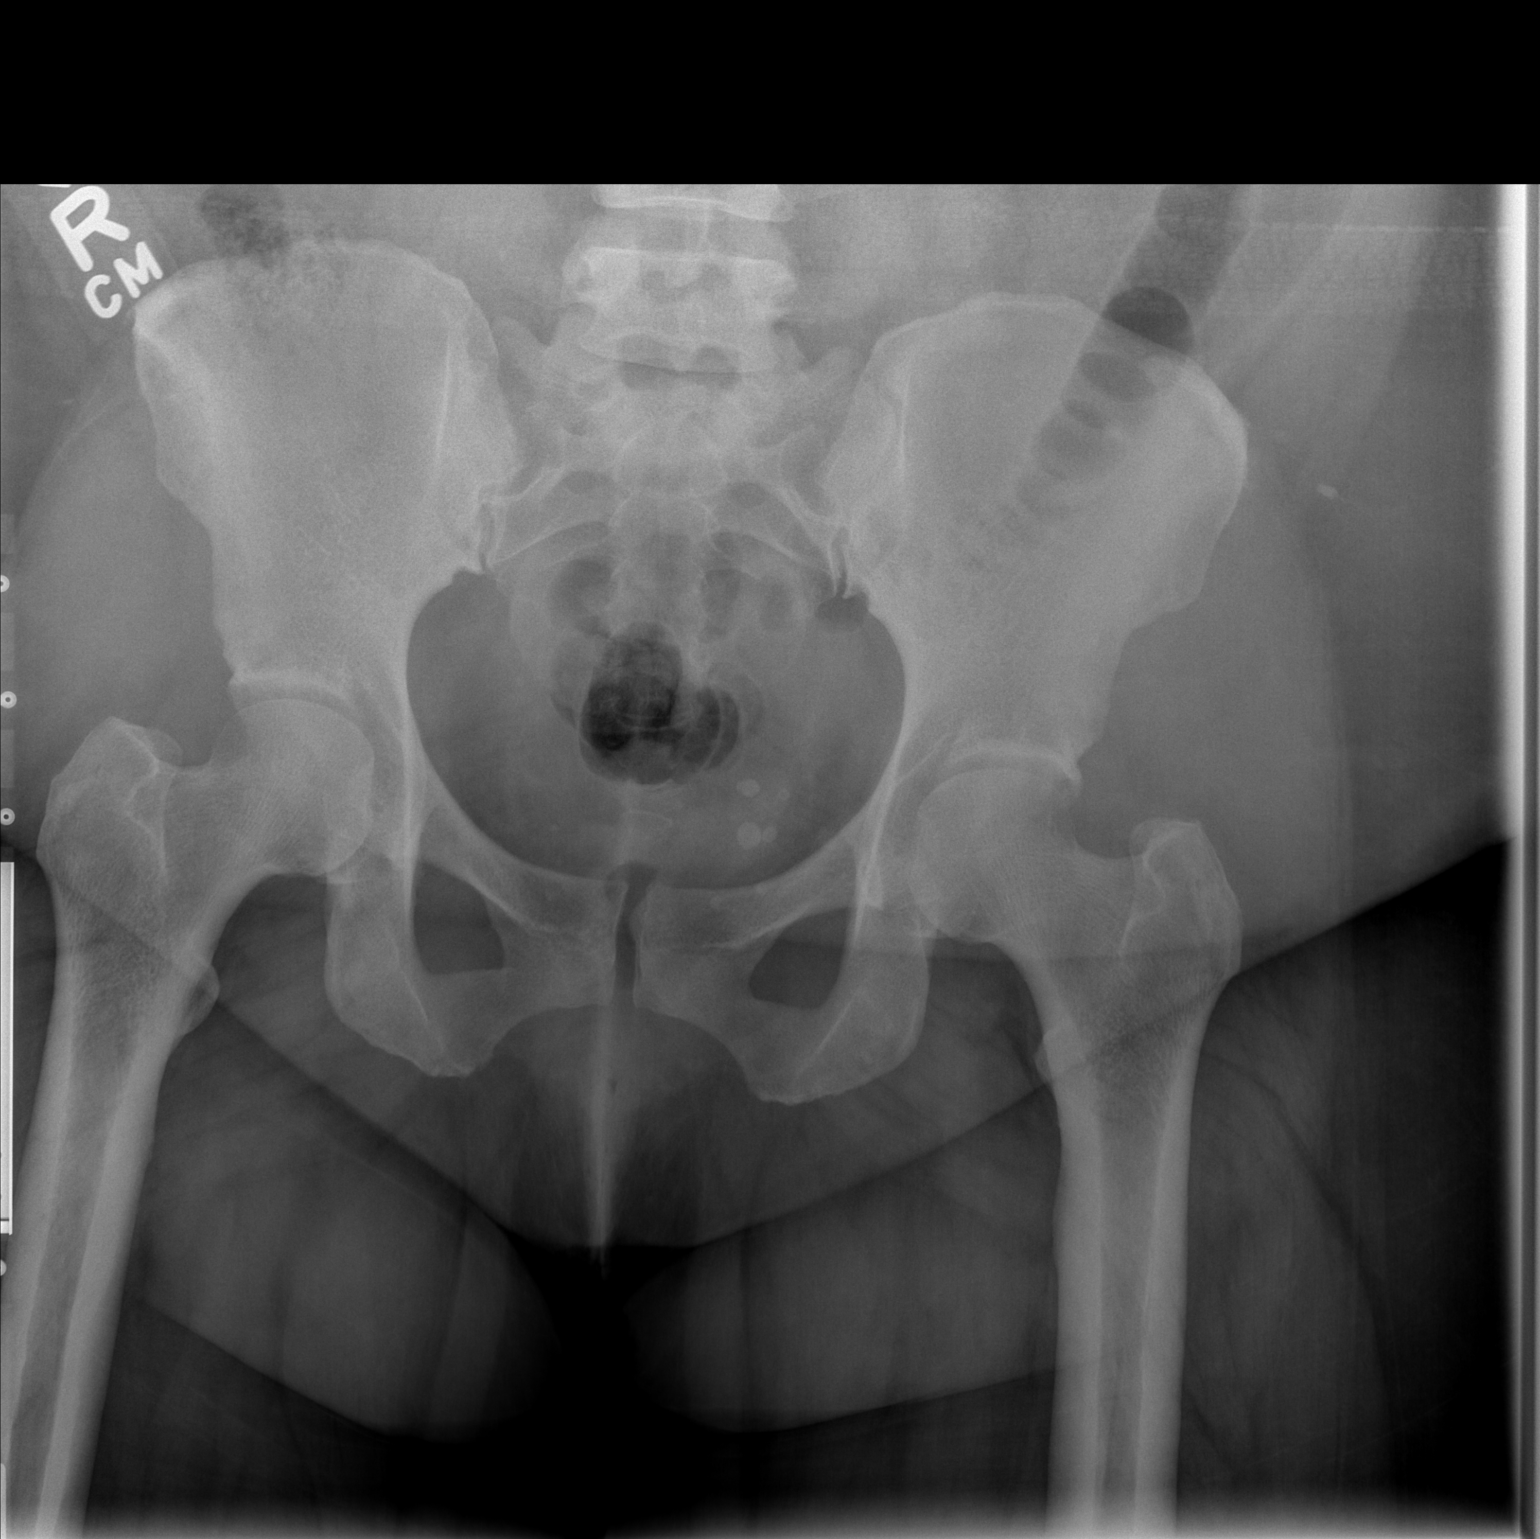

[1 of 1 positions shown; findings below may reference images not displayed]

FINDINGS: No acute bony abnormality.  Specifically, no fracture,
subluxation, or dislocation.  Soft tissues are intact.  SI joints
and hip joints are symmetric and unremarkable.
IMPRESSION: No acute bony abnormality.

## 2012-06-21 ENCOUNTER — Emergency Department (HOSPITAL_COMMUNITY)
Admission: EM | Admit: 2012-06-21 | Discharge: 2012-06-21 | Disposition: A | Discharge status: Home / Self Care | Payer: Medicaid Other | Source: Home / Self Care | Attending: Family Medicine | Admitting: Family Medicine

## 2012-06-21 ENCOUNTER — Encounter (HOSPITAL_COMMUNITY): Payer: Self-pay | Admitting: *Deleted

## 2012-06-21 DIAGNOSIS — J329 Chronic sinusitis, unspecified: Secondary | ICD-10-CM

## 2012-06-21 MED ORDER — SALINE NASAL SPRAY 0.65 % NA SOLN: 1.0000 | NASAL | Status: DC | PRN

## 2012-06-21 MED ORDER — AZITHROMYCIN 250 MG PO TABS: ORAL_TABLET | ORAL | Status: DC

## 2012-06-21 NOTE — ED Provider Notes (Signed)
History     CSN: [id number]  Arrival date & time 06/21/12  1910   First MD Initiated Contact with Patient 06/21/12 2043      Chief Complaint  Patient presents with  . URI    (Consider location/radiation/quality/duration/timing/severity/associated sxs/prior treatment) The history is provided by the patient.   Jodi Bowman is a 51 y.o. female who complains of onset of nasal congestion and sinus symptoms for 3 days.    scratcy sore throat + cough, non productive No pleuritic pain No wheezing + nasal congestion + post-nasal drainage + sinus pain/pressure + voice changes No chest congestion No itchy/red eyes No earache No hemoptysis No SOB No chills/sweats No fever No nausea No vomiting No abdominal pain No diarrhea No skin rashes No fatigue No myalgias No headache  No ill contacts  Past Medical History  Diagnosis Date  . Hypertension   . Hypercholesteremia   . Lower back pain   . Nontoxic uninodular goiter     sees dr vollmer at [facility]  . Calculus of gallbladder without mention of cholecystitis or obstruction   . Arthritis   . Stroke 2000  . Migraine   . Sleep apnea     STOPBANG=5    Past Surgical History  Procedure Date  . Knee arthroscopy one 1995 and 1 in 1997    both knees done  . Abdominal hysterectomy   . Cesarean section yrs ago    done x 2  . Surgery for endometriosis yrs ago  . Thryoid biopsy December 01, 2011     at [facility]  . Cholecystectomy 01/05/2012    Procedure: LAPAROSCOPIC CHOLECYSTECTOMY WITH INTRAOPERATIVE CHOLANGIOGRAM;  Surgeon: Valarie Merino, MD;  Location: [facility];  Service: General;  Laterality: N/A;    Family History  Problem Relation Age of Onset  . Cancer Brother     colon and lung  . Cancer Maternal Grandmother     colon    History  Substance Use Topics  . Smoking status: Never Smoker   . Smokeless tobacco: Never Used  . Alcohol Use: No    OB History    Grav Para Term Preterm Abortions TAB SAB Ect Mult Living              Review of Systems  All other systems reviewed and are negative.    Allergies  Naproxen  Home Medications   Current Outpatient Rx  Name Route Sig Dispense Refill  . AMLODIPINE BESYLATE 5 MG PO TABS Oral Take 5 mg by mouth at bedtime.     . ASPIRIN 81 MG PO CHEW Oral Chew 81 mg by mouth every morning.    . AZITHROMYCIN 250 MG PO TABS  Azithromycin 500mg  on day 1, then 250mg  on days 2-4 6 tablet 0  . LISINOPRIL-HYDROCHLOROTHIAZIDE 20-25 MG PO TABS Oral Take 1 tablet by mouth every morning.     Marland Kitchen LORATADINE 10 MG PO TABS Oral Take 10 mg by mouth every morning.     Marland Kitchen METOPROLOL SUCCINATE ER 50 MG PO TB24 Oral Take 50 mg by mouth at bedtime. Take with or immediately following a meal.    . PRAVASTATIN SODIUM 40 MG PO TABS Oral Take 40 mg by mouth at bedtime.     Marland Kitchen RANITIDINE HCL 150 MG PO TABS Oral Take 2 tablets (300 mg total) by mouth 2 (two) times daily. 60 tablet 0  . SALINE NASAL SPRAY 0.65 % NA SOLN Nasal Place 1 spray into the nose as needed for congestion.  30 mL 12    BP 185/95  Pulse 86  Temp 98.5 F (36.9 C) (Oral)  Resp 18  SpO2 100%  Physical Exam  Nursing note and vitals reviewed. Constitutional: She is oriented to person, place, and time. Vital signs are normal. She appears well-developed and well-nourished. She is active and cooperative.  HENT:  Head: Normocephalic.  Right Ear: Hearing, tympanic membrane, external ear and ear canal normal.  Left Ear: Hearing, tympanic membrane, external ear and ear canal normal.  Nose: No mucosal edema or rhinorrhea. Right sinus exhibits maxillary sinus tenderness. Right sinus exhibits no frontal sinus tenderness. Left sinus exhibits maxillary sinus tenderness. Left sinus exhibits no frontal sinus tenderness.  Mouth/Throat: Uvula is midline, oropharynx is clear and moist and mucous membranes are normal.  Eyes: Conjunctivae normal and EOM are normal. Pupils are equal, round, and reactive to light. No scleral icterus.    Neck: Trachea normal, normal range of motion and full passive range of motion without pain. Neck supple. No spinous process tenderness and no muscular tenderness present. No Brudzinski's sign noted.  Cardiovascular: Normal rate, regular rhythm, normal heart sounds, intact distal pulses and normal pulses.   Pulmonary/Chest: Effort normal and breath sounds normal.  Lymphadenopathy:       Head (right side): No submental, no submandibular, no tonsillar, no preauricular, no posterior auricular and no occipital adenopathy present.       Head (left side): No submental, no submandibular, no tonsillar, no preauricular, no posterior auricular and no occipital adenopathy present.    She has no cervical adenopathy.  Neurological: She is alert and oriented to person, place, and time. No cranial nerve deficit or sensory deficit.  Skin: Skin is warm and dry. No rash noted.  Psychiatric: She has a normal mood and affect. Her speech is normal and behavior is normal. Judgment and thought content normal. Cognition and memory are normal.    ED Course  Procedures (including critical care time)  Labs Reviewed - No data to display No results found.   1. Sinusitis       MDM  Increase fluids, nasal saline spray, azithromycin as prescribed if symptoms are not improved in 5 days.        Johnsie Kindred, NP 06/22/12 1020

## 2012-06-21 NOTE — ED Notes (Signed)
Sinus infection per pt

## 2012-06-25 NOTE — ED Provider Notes (Signed)
Medical screening examination/treatment/procedure(s) were performed by resident physician or non-physician practitioner and as supervising physician I was immediately available for consultation/collaboration.   Barkley Bruns MD.    Linna Hoff, MD 06/25/12 [phone]

## 2012-08-01 ENCOUNTER — Emergency Department (HOSPITAL_COMMUNITY)
Admission: EM | Admit: 2012-08-01 | Discharge: 2012-08-01 | Disposition: A | Discharge status: Home / Self Care | Payer: Medicaid Other | Source: Home / Self Care | Attending: Emergency Medicine | Admitting: Emergency Medicine

## 2012-08-01 ENCOUNTER — Encounter (HOSPITAL_COMMUNITY): Payer: Self-pay | Admitting: Emergency Medicine

## 2012-08-01 DIAGNOSIS — J329 Chronic sinusitis, unspecified: Secondary | ICD-10-CM

## 2012-08-01 MED ORDER — AMOXICILLIN 500 MG PO CAPS: 500.0000 mg | ORAL_CAPSULE | Freq: Three times a day (TID) | ORAL | Status: AC

## 2012-08-01 MED ORDER — GUAIFENESIN-CODEINE 100-10 MG/5ML PO SYRP: 5.0000 mL | ORAL_SOLUTION | Freq: Three times a day (TID) | ORAL | Status: DC | PRN

## 2012-08-01 MED ORDER — FEXOFENADINE HCL 60 MG PO TABS: 60.0000 mg | ORAL_TABLET | Freq: Two times a day (BID) | ORAL | Status: DC

## 2012-08-01 NOTE — ED Provider Notes (Signed)
History     CSN: [id number]  Arrival date & time 08/01/12  1757   First MD Initiated Contact with Patient 08/01/12 1803      Chief Complaint  Patient presents with  . Sinusitis    (Consider location/radiation/quality/duration/timing/severity/associated sxs/prior treatment) HPI Comments: Patient presents to urgent care this evening complaining that for about 7-10 days she's been having a cold and doesn't seem to be able to shake. For the last 3-4 days she expresses that she's been having increasing sinus pressure (points to both maxillary sinuses) also with some postnasal drainage. Denies any fever but both ears also been hurting and now she's having a constant cough sneezing. This is given her a headache. She has tried with some over-the-counter Alka-Seltzer but has not helped  Patient is a 51 y.o. female presenting with sinusitis. The history is provided by the patient.  Sinusitis  This is a new problem. The current episode started more than 1 week ago. The problem has not changed since onset.There has been no fever. The pain is at a severity of 7/10. The pain is moderate. Associated symptoms include congestion, hoarse voice, sinus pressure, swollen glands and cough. Pertinent negatives include no chills, no ear pain and no shortness of breath. She has tried nothing for the symptoms. The treatment provided no relief.    Past Medical History  Diagnosis Date  . Hypertension   . Hypercholesteremia   . Lower back pain   . Nontoxic uninodular goiter     sees dr vollmer at [facility]  . Calculus of gallbladder without mention of cholecystitis or obstruction   . Arthritis   . Stroke 2000  . Migraine   . Sleep apnea     STOPBANG=5    Past Surgical History  Procedure Date  . Knee arthroscopy one 1995 and 1 in 1997    both knees done  . Abdominal hysterectomy   . Cesarean section yrs ago    done x 2  . Surgery for endometriosis yrs ago  . Thryoid biopsy December 01, 2011     at [facility]    . Cholecystectomy 01/05/2012    Procedure: LAPAROSCOPIC CHOLECYSTECTOMY WITH INTRAOPERATIVE CHOLANGIOGRAM;  Surgeon: Valarie Merino, MD;  Location: [facility];  Service: General;  Laterality: N/A;    Family History  Problem Relation Age of Onset  . Cancer Brother     colon and lung  . Cancer Maternal Grandmother     colon    History  Substance Use Topics  . Smoking status: Never Smoker   . Smokeless tobacco: Never Used  . Alcohol Use: No    OB History    Grav Para Term Preterm Abortions TAB SAB Ect Mult Living                  Review of Systems  Constitutional: Positive for activity change. Negative for fever and chills.  HENT: Positive for congestion, hoarse voice, rhinorrhea and sinus pressure. Negative for hearing loss, ear pain, drooling, mouth sores, neck pain, neck stiffness, dental problem and ear discharge.   Respiratory: Positive for cough. Negative for shortness of breath.   Genitourinary: Negative for dysuria.  Skin: Negative for color change.  Neurological: Negative for dizziness.    Allergies  Naproxen  Home Medications   Current Outpatient Rx  Name  Route  Sig  Dispense  Refill  . AMLODIPINE BESYLATE 5 MG PO TABS   Oral   Take 5 mg by mouth at bedtime.          Marland Kitchen  AMOXICILLIN 500 MG PO CAPS   Oral   Take 1 capsule (500 mg total) by mouth 3 (three) times daily.   21 capsule   0   . ASPIRIN 81 MG PO CHEW   Oral   Chew 81 mg by mouth every morning.         . AZITHROMYCIN 250 MG PO TABS      Azithromycin 500mg  on day 1, then 250mg  on days 2-4   6 tablet   0   . FEXOFENADINE HCL 60 MG PO TABS   Oral   Take 1 tablet (60 mg total) by mouth 2 (two) times daily.   15 tablet   0   . GUAIFENESIN-CODEINE 100-10 MG/5ML PO SYRP   Oral   Take 5 mLs by mouth 3 (three) times daily as needed for cough.   120 mL   0   . LISINOPRIL-HYDROCHLOROTHIAZIDE 20-25 MG PO TABS   Oral   Take 1 tablet by mouth every morning.          Marland Kitchen LORATADINE 10 MG  PO TABS   Oral   Take 10 mg by mouth every morning.          Marland Kitchen METOPROLOL SUCCINATE ER 50 MG PO TB24   Oral   Take 50 mg by mouth at bedtime. Take with or immediately following a meal.         . PRAVASTATIN SODIUM 40 MG PO TABS   Oral   Take 40 mg by mouth at bedtime.          Marland Kitchen RANITIDINE HCL 150 MG PO TABS   Oral   Take 2 tablets (300 mg total) by mouth 2 (two) times daily.   60 tablet   0   . SALINE NASAL SPRAY 0.65 % NA SOLN   Nasal   Place 1 spray into the nose as needed for congestion.   30 mL   12     BP 141/82  Pulse 73  Temp 98.2 F (36.8 C) (Oral)  Resp 20  SpO2 96%  Physical Exam  Nursing note and vitals reviewed. Constitutional: Vital signs are normal. She appears well-developed and well-nourished.  Non-toxic appearance. She does not have a sickly appearance.  HENT:  Right Ear: Tympanic membrane normal.  Left Ear: Tympanic membrane normal.  Mouth/Throat: Uvula is midline, oropharynx is clear and moist and mucous membranes are normal.  Eyes: Conjunctivae normal are normal.  Neck: Neck supple. No JVD present.  Cardiovascular: Normal rate.   Pulmonary/Chest: Breath sounds normal. No respiratory distress. She has no decreased breath sounds.  Abdominal: Soft.  Musculoskeletal: She exhibits no tenderness.  Neurological: She is alert.  Skin: No rash noted. No erythema.    ED Course  Procedures (including critical care time)  Labs Reviewed - No data to display No results found.   1. Sinusitis       MDM  Upper respiratory infection coexistent with sinusitis predominantly maxillary. Patient been symptomatic for 7 days. Was prescribed Allegra and antibiotics cycle of amoxicillin. Among other measures the patient was instructed.        Jimmie Molly, MD 08/01/12 1945

## 2012-08-01 NOTE — ED Notes (Signed)
Reports sinus infection for three days now.  Patient states she has used OTC medications.   Reports non productive coughing, sneezing, facial sinus area is sore and headache

## 2012-08-23 IMAGING — CR DG CHEST 1V PORT
1 series · 1 of 1 positions shown · non-contrast
Comparison: CT day 09/22/2011.

CLINICAL DATA: Back pain.  Chest pain.  Hypertension.

PORTABLE CHEST - 1 VIEW

[AP]
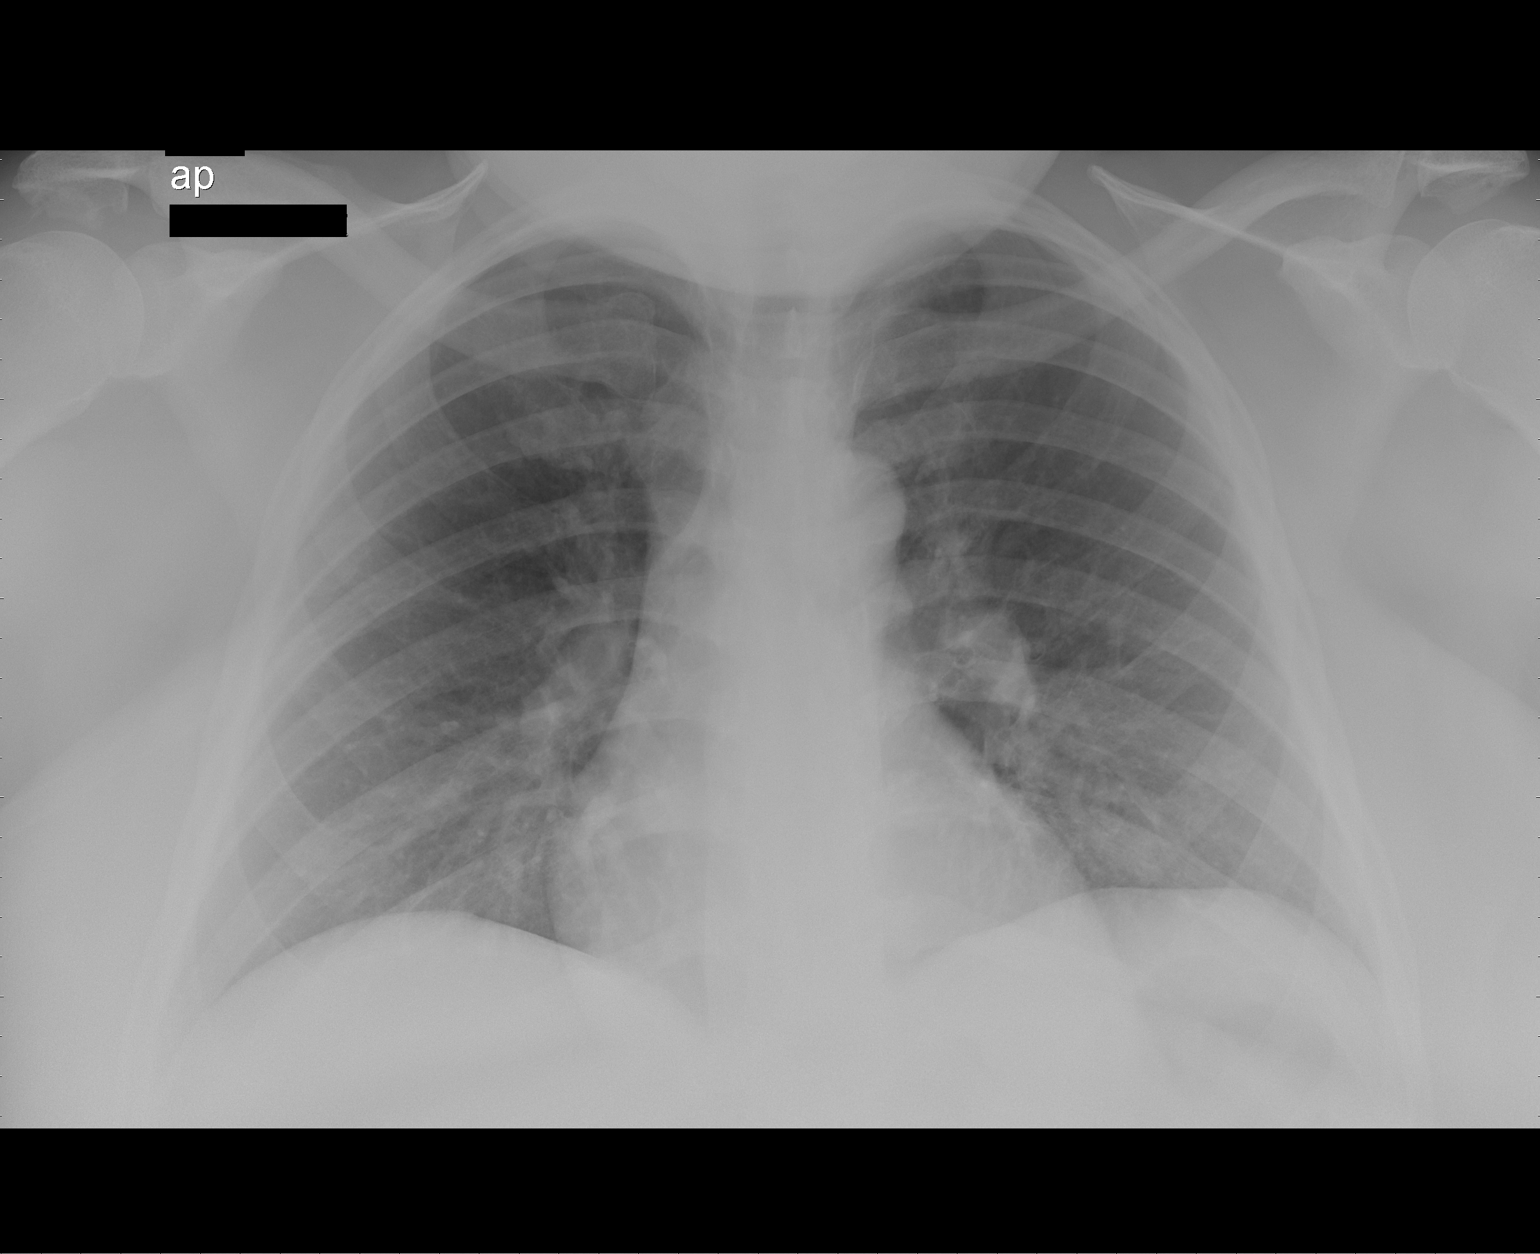

[1 of 1 positions shown; findings below may reference images not displayed]

FINDINGS: Low volume chest with bilateral basilar atelectasis.
Cardiopericardial silhouette and mediastinal contours normal.  No
airspace disease or effusion.
IMPRESSION: Low volume chest.

## 2012-09-17 ENCOUNTER — Other Ambulatory Visit: Payer: Self-pay | Admitting: Gastroenterology

## 2012-09-23 ENCOUNTER — Encounter (HOSPITAL_COMMUNITY): Payer: Self-pay | Admitting: Pharmacy Technician

## 2012-10-08 ENCOUNTER — Ambulatory Visit (HOSPITAL_COMMUNITY)
Admission: RE | Admit: 2012-10-08 | Discharge: 2012-10-08 | Disposition: A | Discharge status: Home / Self Care | Payer: Medicaid Other | Source: Ambulatory Visit | Attending: Gastroenterology | Admitting: Gastroenterology

## 2012-10-08 ENCOUNTER — Encounter (HOSPITAL_COMMUNITY)
Admission: RE | Disposition: A | Discharge status: Home / Self Care | Payer: Self-pay | Source: Ambulatory Visit | Attending: Gastroenterology

## 2012-10-08 ENCOUNTER — Encounter (HOSPITAL_COMMUNITY): Payer: Self-pay

## 2012-10-08 DIAGNOSIS — K620 Anal polyp: Secondary | ICD-10-CM | POA: Insufficient documentation

## 2012-10-08 DIAGNOSIS — D126 Benign neoplasm of colon, unspecified: Secondary | ICD-10-CM | POA: Insufficient documentation

## 2012-10-08 DIAGNOSIS — D3A026 Benign carcinoid tumor of the rectum: Secondary | ICD-10-CM | POA: Insufficient documentation

## 2012-10-08 DIAGNOSIS — Z8673 Personal history of transient ischemic attack (TIA), and cerebral infarction without residual deficits: Secondary | ICD-10-CM | POA: Insufficient documentation

## 2012-10-08 DIAGNOSIS — E7800 Pure hypercholesterolemia, unspecified: Secondary | ICD-10-CM | POA: Insufficient documentation

## 2012-10-08 DIAGNOSIS — G4730 Sleep apnea, unspecified: Secondary | ICD-10-CM | POA: Insufficient documentation

## 2012-10-08 DIAGNOSIS — Z800 Family history of malignant neoplasm of digestive organs: Secondary | ICD-10-CM | POA: Insufficient documentation

## 2012-10-08 DIAGNOSIS — I10 Essential (primary) hypertension: Secondary | ICD-10-CM | POA: Insufficient documentation

## 2012-10-08 HISTORY — PX: COLONOSCOPY: SHX5424

## 2012-10-08 SURGERY — COLONOSCOPY
Anesthesia: Moderate Sedation

## 2012-10-08 MED ORDER — MIDAZOLAM HCL 10 MG/2ML IJ SOLN
INTRAMUSCULAR | Status: DC | PRN
  Administered 2012-10-08: 2 mg via INTRAVENOUS
  Administered 2012-10-08: 1 mg via INTRAVENOUS
  Administered 2012-10-08 (×2): 2 mg via INTRAVENOUS

## 2012-10-08 MED ORDER — FENTANYL CITRATE 0.05 MG/ML IJ SOLN
INTRAMUSCULAR | Status: DC | PRN
  Administered 2012-10-08 (×3): 25 ug via INTRAVENOUS

## 2012-10-08 MED ORDER — SODIUM CHLORIDE 0.9 % IV SOLN: INTRAVENOUS | Status: DC

## 2012-10-08 MED ORDER — FENTANYL CITRATE 0.05 MG/ML IJ SOLN
INTRAMUSCULAR | Status: AC
  Filled 2012-10-08: qty 4

## 2012-10-08 MED ORDER — DIPHENHYDRAMINE HCL 50 MG/ML IJ SOLN
INTRAMUSCULAR | Status: AC
  Filled 2012-10-08: qty 1

## 2012-10-08 MED ORDER — MIDAZOLAM HCL 10 MG/2ML IJ SOLN
INTRAMUSCULAR | Status: AC
  Filled 2012-10-08: qty 4

## 2012-10-08 NOTE — Op Note (Signed)
[facility] [address] [place], [address]   COLONOSCOPY PROCEDURE REPORT  PATIENT: Jodi Bowman, Jodi Bowman  MR#: [id number] BIRTHDATE: 29-May-1961 , 51  yrs. old GENDER: Female ENDOSCOPIST: Jeani Hawking, MD REFERRED BY: PROCEDURE DATE:  10/08/2012 PROCEDURE:   Colonoscopy with snare polypectomy ASA CLASS:   Class III INDICATIONS:Hematochezia and FH of colon cancer. MEDICATIONS: Versed 7 mg IV and Fentanyl 75 mcg IV  DESCRIPTION OF PROCEDURE:   After the risks benefits and alternatives of the procedure were thoroughly explained, informed consent was obtained.  A digital rectal exam revealed no abnormalities of the rectum.   The Pentax Colonoscope [id number] endoscope was introduced through the anus and advanced to the cecum, which was identified by both the appendix and ileocecal valve. No adverse events experienced.   The quality of the prep was good.  The instrument was then slowly withdrawn as the colon was fully examined.      FINDINGS: In the transverse colon a total of 3 polyps were found. They were removed with a cold snare.  In the rectum an 8 mm nodule was removed with a hot snare.  It is suspicious for a carinoid. Extensive cautery was required and prophylactically two hemoclips were deployed to seal the site.  Retroflexed views revealed internal/external hemorrhoids. The time to cecum=2 minutes 0 seconds.  Withdrawal time=15 minutes 0 seconds.  The scope was withdrawn and the procedure completed. COMPLICATIONS: There were no complications.  ENDOSCOPIC IMPRESSION: 1) Polyps.  RECOMMENDATIONS: 1) Await biopsy results. 2) Repeat the colonoscopy in 3-5 years.   eSigned:  Jeani Hawking, MD 10/08/2012 11:58 AM   cc:

## 2012-10-08 NOTE — H&P (Signed)
Reason for Consult: Hematochezia and family history of colon cancer  Jodi Bowman HPI: The patient reported some episodes of hematochezia and a family history of colon cancer.  No bleeding with the prep.  Past Medical History  Diagnosis Date  . Hypertension   . Hypercholesteremia   . Lower back pain   . Nontoxic uninodular goiter     sees dr vollmer at [facility]  . Calculus of gallbladder without mention of cholecystitis or obstruction   . Arthritis   . Stroke 2000  . Migraine   . Sleep apnea     STOPBANG=5    Past Surgical History  Procedure Date  . Knee arthroscopy one 1995 and 1 in 1997    both knees done  . Abdominal hysterectomy   . Cesarean section yrs ago    done x 2  . Surgery for endometriosis yrs ago  . Thryoid biopsy December 01, 2011     at [facility]  . Cholecystectomy 01/05/2012    Procedure: LAPAROSCOPIC CHOLECYSTECTOMY WITH INTRAOPERATIVE CHOLANGIOGRAM;  Surgeon: Valarie Merino, MD;  Location: [facility];  Service: General;  Laterality: N/A;    Family History  Problem Relation Age of Onset  . Cancer Brother     colon and lung  . Cancer Maternal Grandmother     colon    Social History:  reports that she has never smoked. She has never used smokeless tobacco. She reports that she does not drink alcohol or use illicit drugs.  Allergies:  Allergies  Allergen Reactions  . Naproxen Other (See Comments)    Makes stomach cramp and burning badly.    Medications:  Scheduled:  Continuous:   . sodium chloride      No results found for this or any previous visit (from the past 24 hour(s)).   No results found.  ROS:  As stated above in the HPI otherwise negative.  Blood pressure 145/78, pulse 72, temperature 98.4 F (36.9 C), temperature source Oral, resp. rate 14, height 4\' 11"  (1.499 m), weight 281 lb (127.461 kg), SpO2 97.00%.    PE: Gen: NAD, Alert and Oriented HEENT:  [place]/AT, EOMI Neck: Supple, no LAD Lungs: CTA Bilaterally CV: RRR without  M/G/R ABM: Soft, NTND, +BS Ext: No C/C/E  Assessment/Plan: 1) Hematochezia 2) FH of colon cancer.  Plan: 1) Colonoscopy.  Jodi Bowman D 10/08/2012, 2:31 PM

## 2012-10-11 ENCOUNTER — Encounter (HOSPITAL_COMMUNITY): Payer: Self-pay | Admitting: Gastroenterology

## 2012-10-14 IMAGING — US US SOFT TISSUE HEAD/NECK
1 series · 13 of 25 positions shown · non-contrast
Comparison: CT chest 09/22/2011

CLINICAL DATA: Thyroid nodule identified on CT chest

THYROID ULTRASOUND
TECHNIQUE: Ultrasound examination of the thyroid gland and adjacent
soft tissues was performed.

[Series 1: us soft tissue head/neck · 0.09mm/px · 13 of 47 slices shown]
[im 1/47]
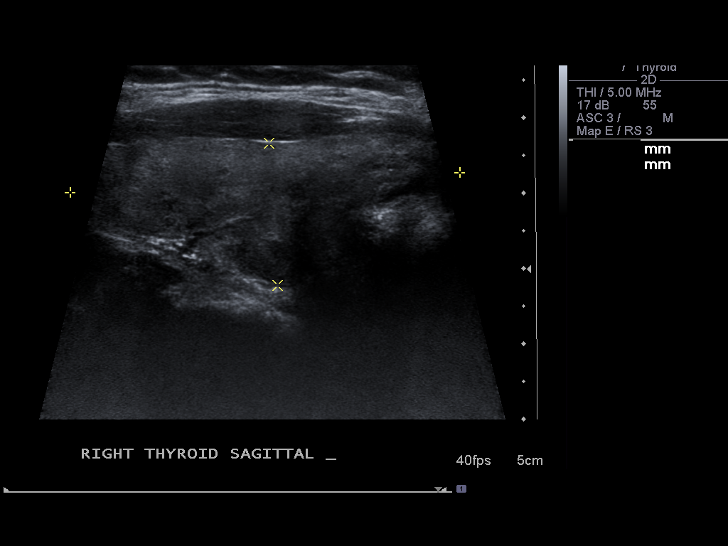
[im 4/47]
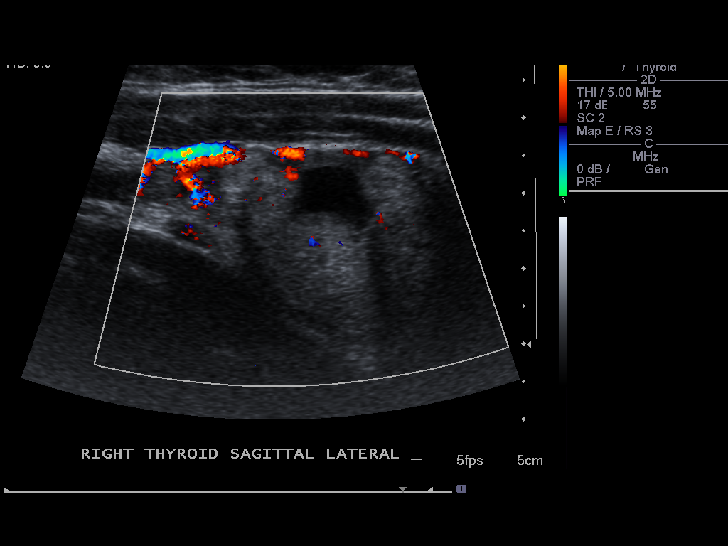
[im 8/47]
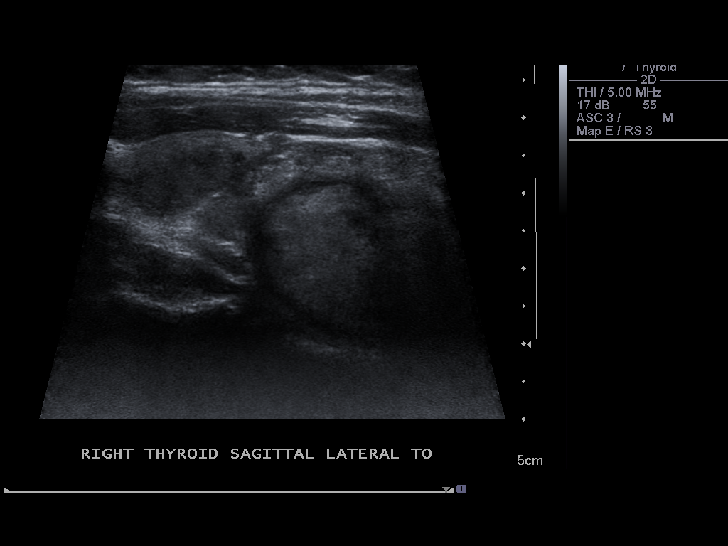
[im 12/47]
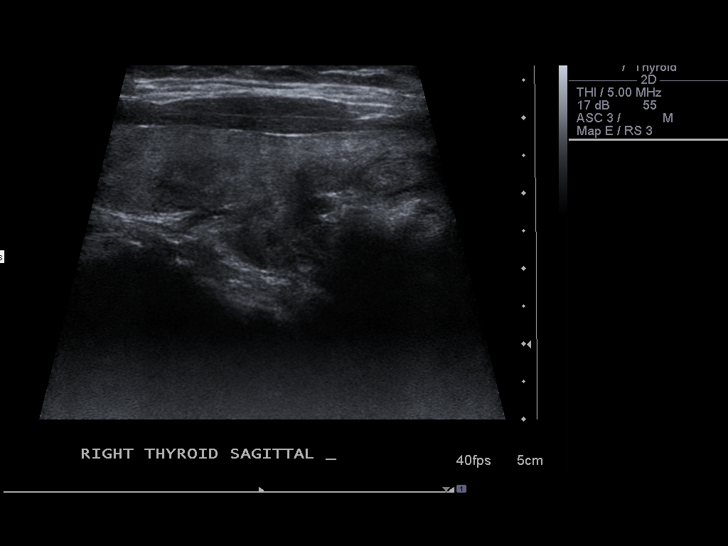
[im 16/47]
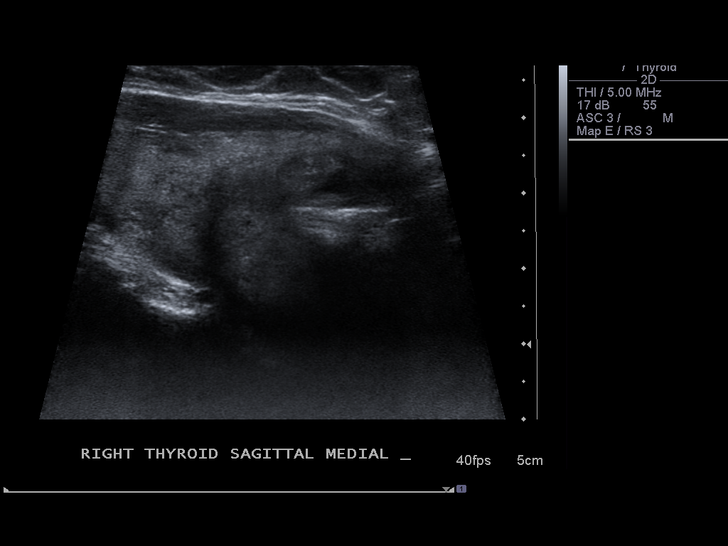
[im 20/47]
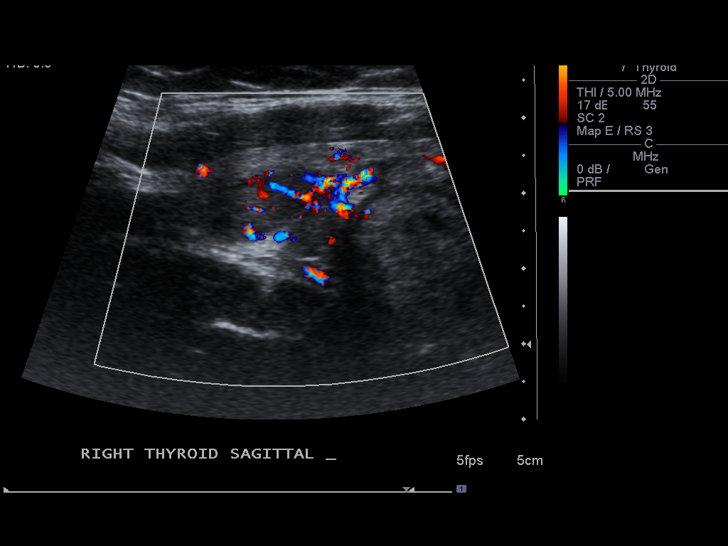
[im 24/47]
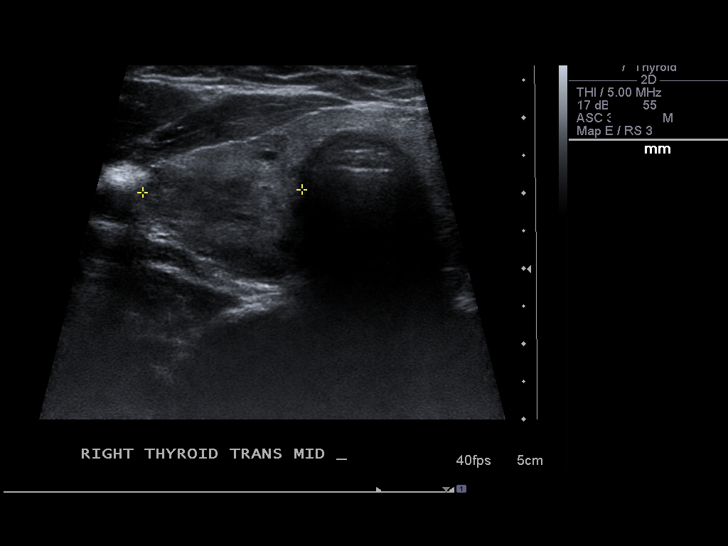
[im 27/47]
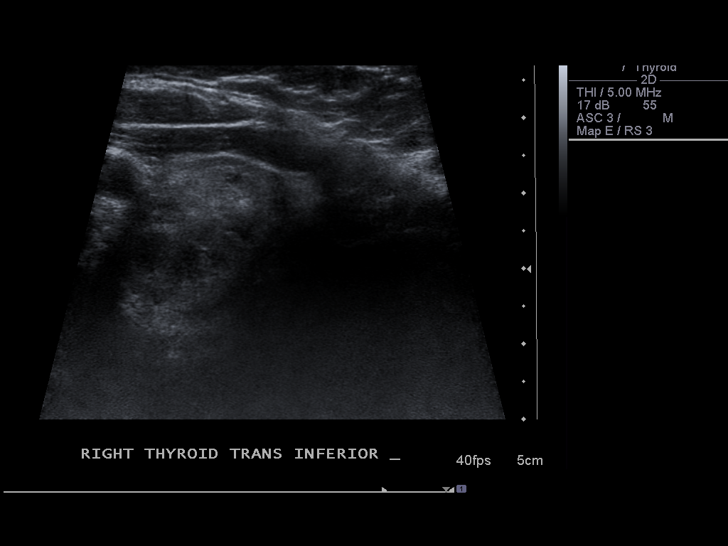
[im 31/47]
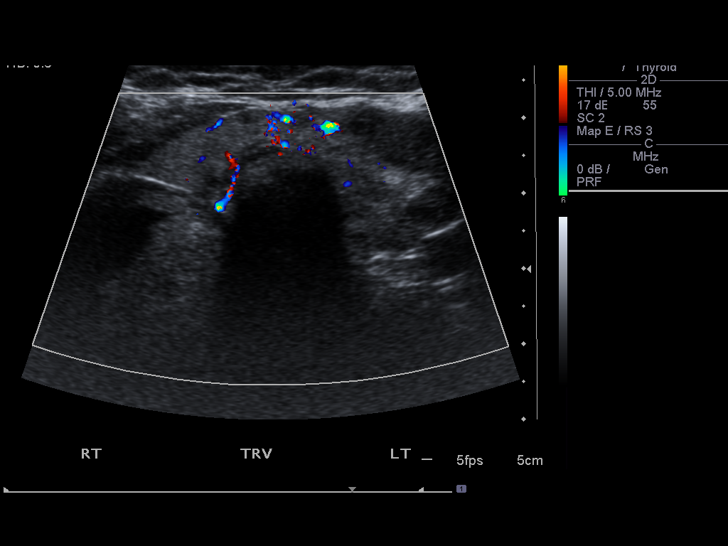
[im 35/47]
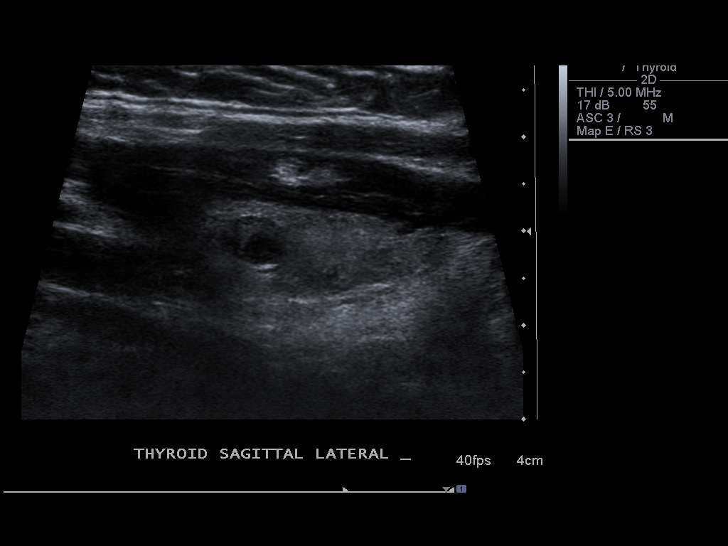
[im 39/47]
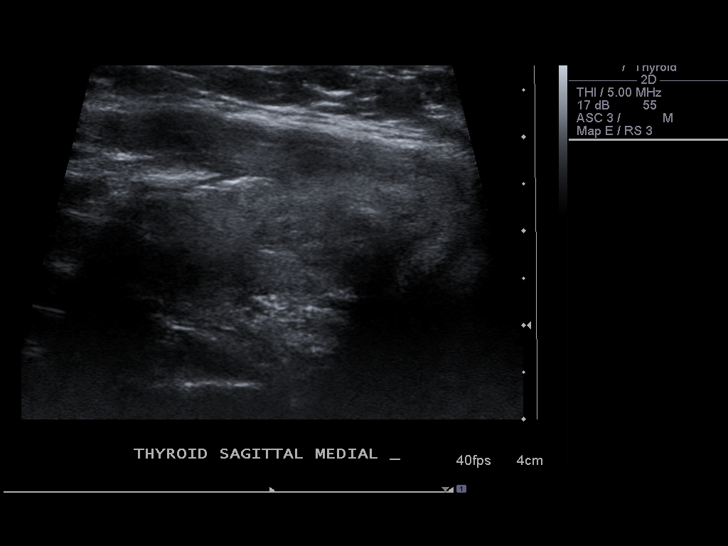
[im 43/47]
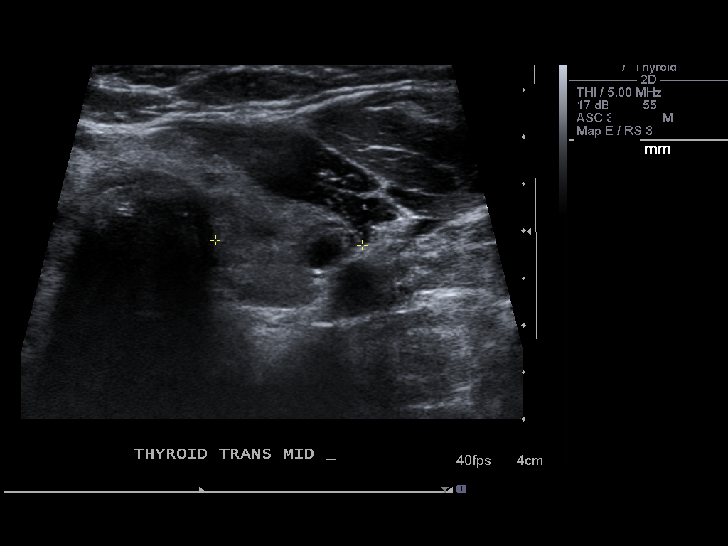
[im 47/47]
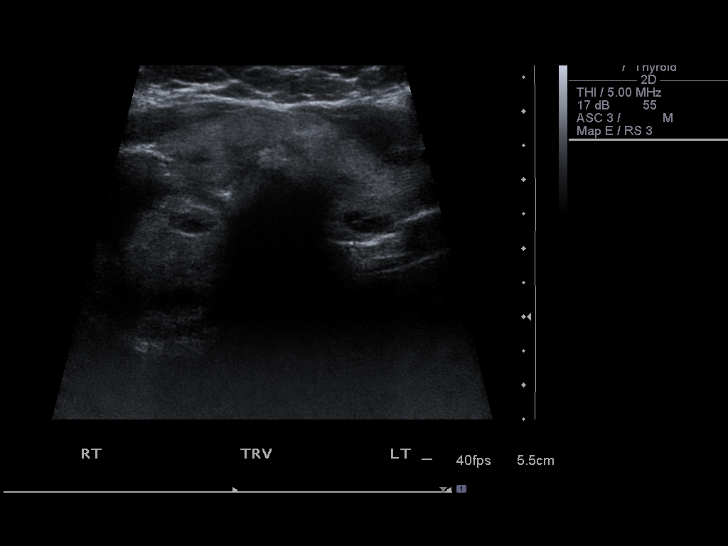

[13 of 25 positions shown; findings below may reference images not displayed]

FINDINGS: Right thyroid lobe:  5.2 x 1.9 x 2.1 cm.  Heterogeneous
echogenicity.
Left thyroid lobe:  4.6 x 1.3 x 1.6 cm.  Heterogeneous
echogenicity.
Isthmus:  4 mm thick

Focal nodules:  Solid mass with small cystic component identified
at inferior pole right lobe 2.9 x 2.8 x 2.4 cm.  Internal blood
flow present within this nodule on color Doppler imaging.
Additional 1.6 x 0.8 x 1.1 cm solid nodule mid to upper right
thyroid lobe.  Small left thyroid nodules identified 8 and 7 mm in
greatest sizes.

Lymphadenopathy:  None identified
IMPRESSION: Bilateral thyroid nodules with a dominant mass at the inferior pole
of the right lobe, 2.9 cm greatest size.  Additional 1.6 cm
diameter solid nodule mid to upper right thyroid lobe.  Findings
meet criteria for tissue diagnosis, reference provided below.
Diagnosis may be performed by ultrasound-guided fine needle
aspiration.

This recommendation follows the consensus statement:  Management of
Thyroid Nodules Detected as US:  Society of [employer]iologists in
800.  Available online at :
[URL]

## 2012-10-21 ENCOUNTER — Other Ambulatory Visit (HOSPITAL_COMMUNITY): Payer: Self-pay | Admitting: Family Medicine

## 2012-10-21 DIAGNOSIS — E049 Nontoxic goiter, unspecified: Secondary | ICD-10-CM

## 2012-11-01 IMAGING — US US THYROID BIOPSY
1 series · 13 of 25 positions shown · non-contrast
Comparison: Ultrasound performed of the thyroid on 11/13/2011

CLINICAL DATA: Incidental finding of thyroid nodules on prior CT
scan.  No prior history of any thyroid abnormalities.

ULTRASOUND GUIDED NEEDLE ASPIRATE BIOPSY OF THE THYROID GLAND x 2

[Series 1: us thyroid biopsy · 0.09mm/px · 13 of 30 slices shown]
[im 1/30]
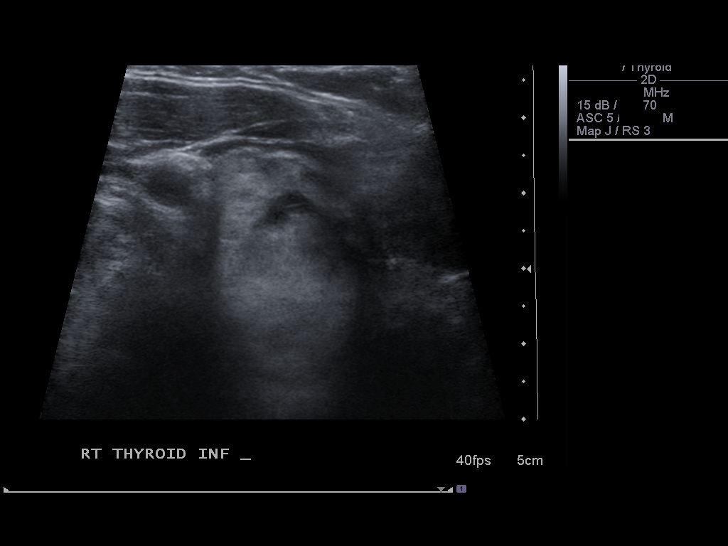
[im 3/30]
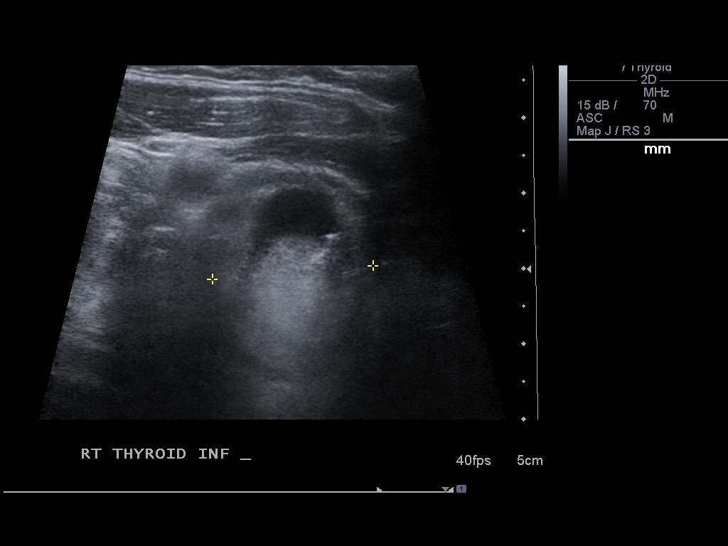
[im 5/30]
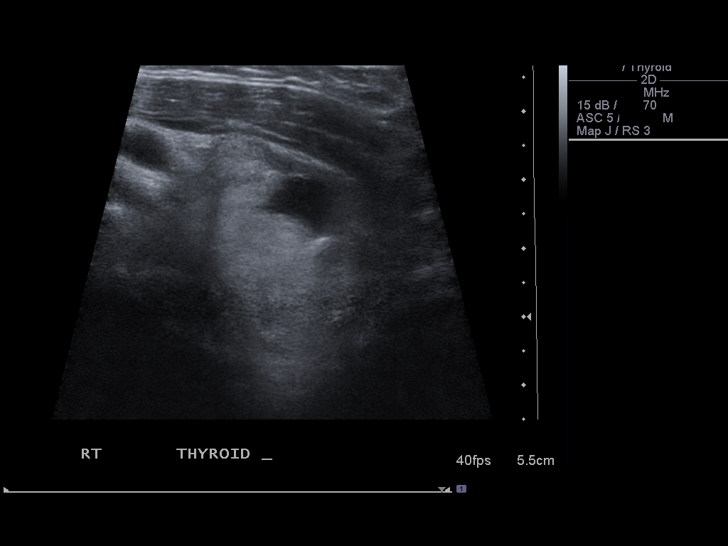
[im 8/30]
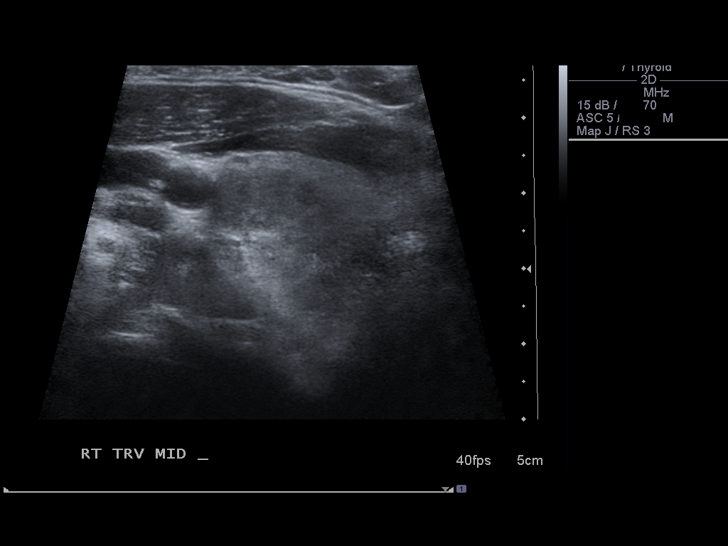
[im 10/30]
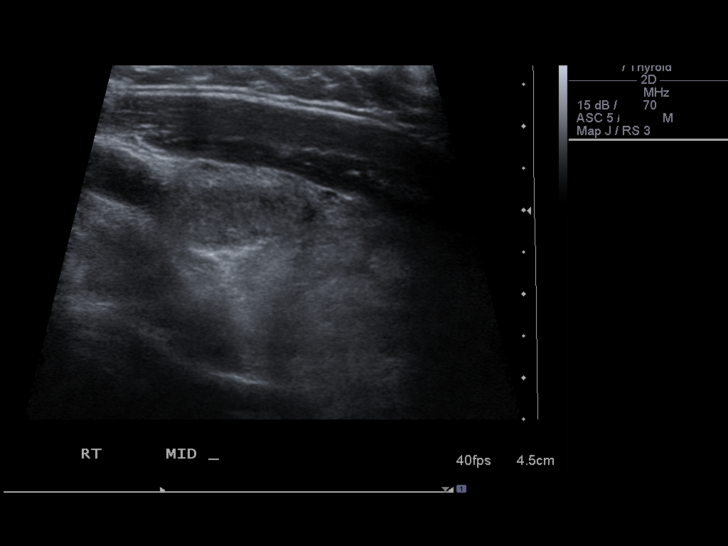
[im 13/30]
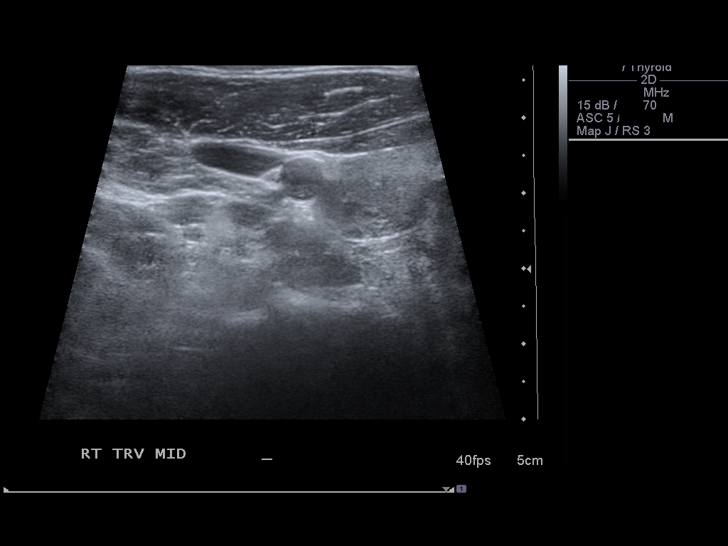
[im 15/30]
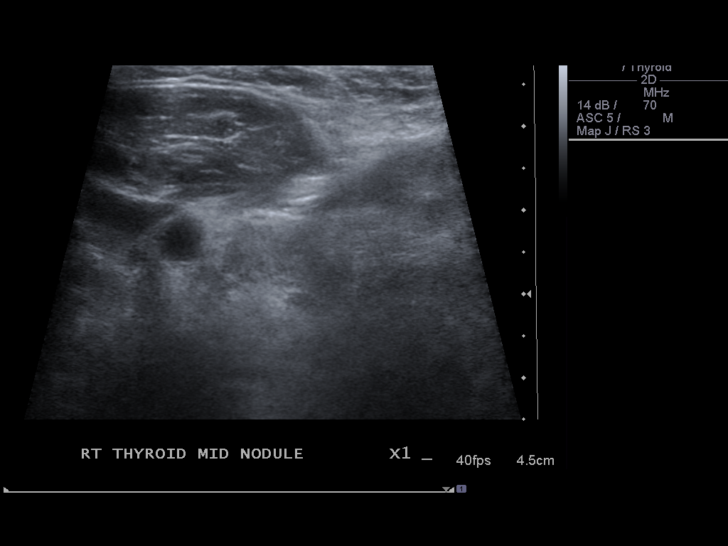
[im 17/30]
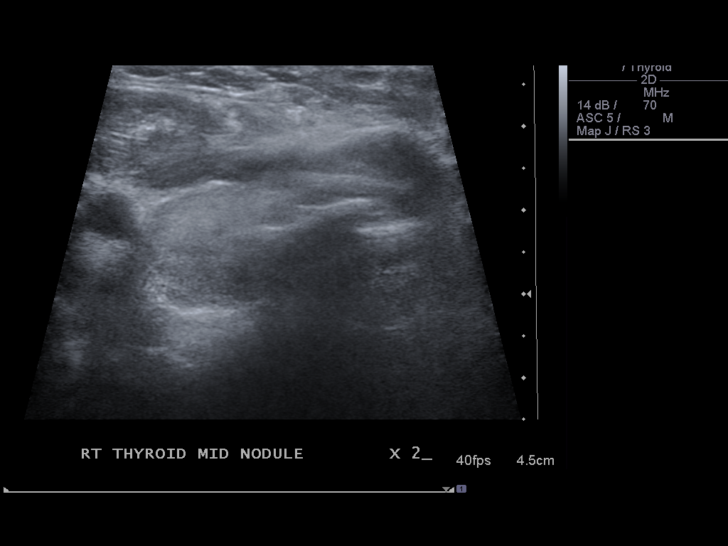
[im 20/30]
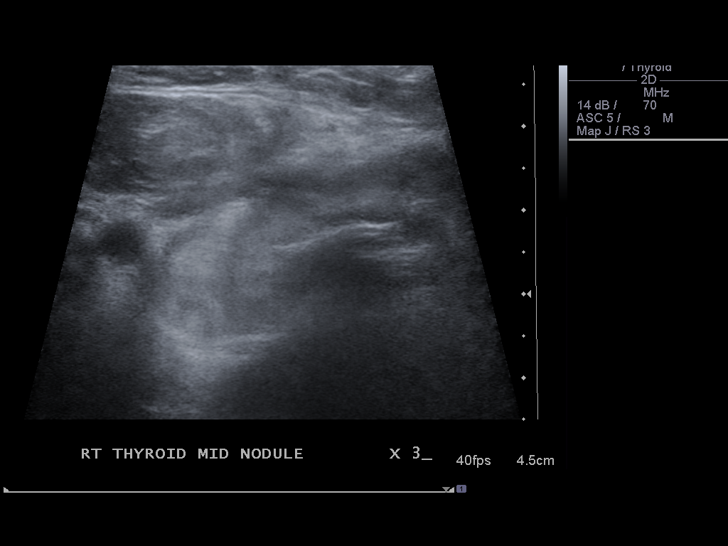
[im 22/30]
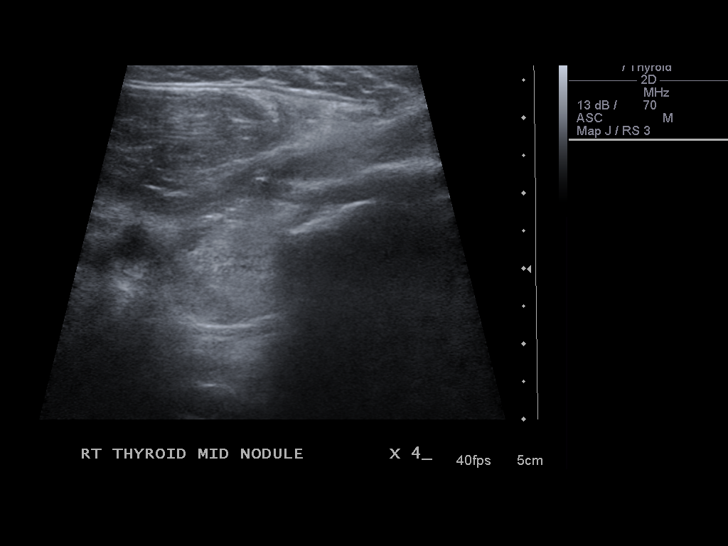
[im 25/30]
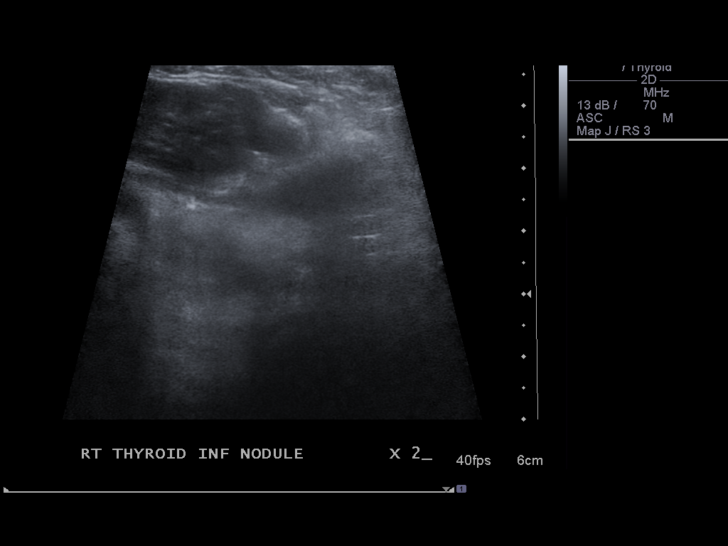
[im 27/30]
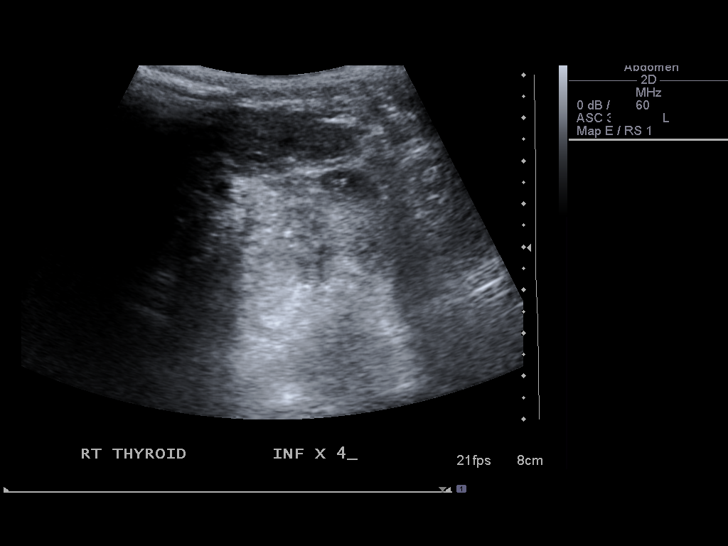
[im 30/30]
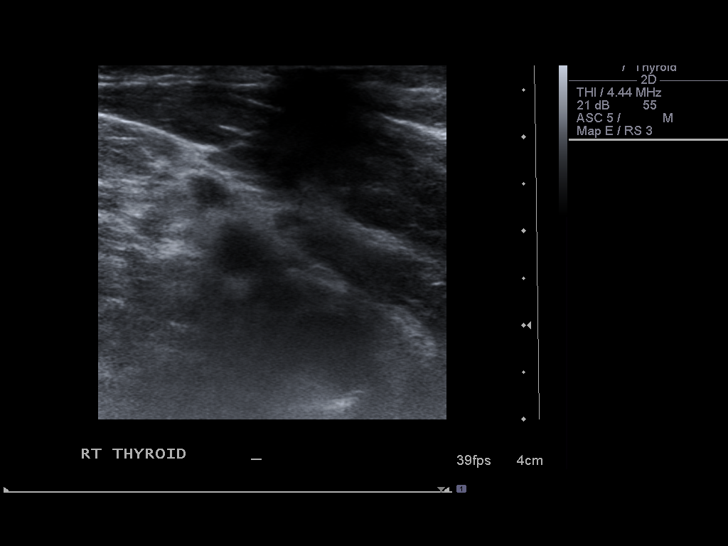

[13 of 25 positions shown; findings below may reference images not displayed]

Thyroid biopsy was thoroughly discussed with the patient and
questions were answered.  The benefits, risks, alternatives, and
complications were also discussed.  The patient understands and
wishes to proceed with the procedure.  Written consent was
obtained.

Ultrasound was performed to localize and mark an adequate site for
the biopsy.  The patient was then prepped and draped in a normal
sterile fashion.  Local anesthesia was provided with 1% lidocaine.
Using direct ultrasound guidance, 4 passes were made using 25 gauge
needles into the nodule within the right middle lobe of the
thyroid.  Ultrasound was used to confirm needle placements on all
occasions.  Specimens were sent to Pathology for analysis.

Local anesthesia was provided with 1% lidocaine over the lower
neck.  Using direct ultrasound guidance, four passes were made
using 22 gauge needles into the nodule within the right inferior
lobe of the thyroid.  Ultrasound was used to confirm needle
placement on all occasions.  The specimens were sent to pathology
for analysis.

Complications:  None immediate
FINDINGS: Right middle lobe thyroid nodule and right inferior
thyroid nodule as seen on CT and ultrasound.
IMPRESSION: Ultrasound guided needle aspirate biopsy performed of the right
middle and right inferior thyroid nodules as described above.

Read by: Jumper, Moneybagg.-NAIROBI

## 2012-11-12 ENCOUNTER — Ambulatory Visit (HOSPITAL_COMMUNITY): Payer: Medicaid Other

## 2012-11-17 ENCOUNTER — Ambulatory Visit (HOSPITAL_COMMUNITY)
Admission: RE | Admit: 2012-11-17 | Discharge: 2012-11-17 | Disposition: A | Discharge status: Home / Self Care | Payer: Medicaid Other | Source: Ambulatory Visit | Attending: Family Medicine | Admitting: Family Medicine

## 2012-11-17 DIAGNOSIS — E049 Nontoxic goiter, unspecified: Secondary | ICD-10-CM

## 2012-11-17 DIAGNOSIS — E042 Nontoxic multinodular goiter: Secondary | ICD-10-CM | POA: Insufficient documentation

## 2012-12-05 IMAGING — CR DG ABDOMEN 2V
3 series · 3 of 3 positions shown · non-contrast
Comparison: 07/14/2011

CLINICAL DATA: Abdominal pain, constipation

ABDOMEN - 2 VIEW

[w abdomen upright]
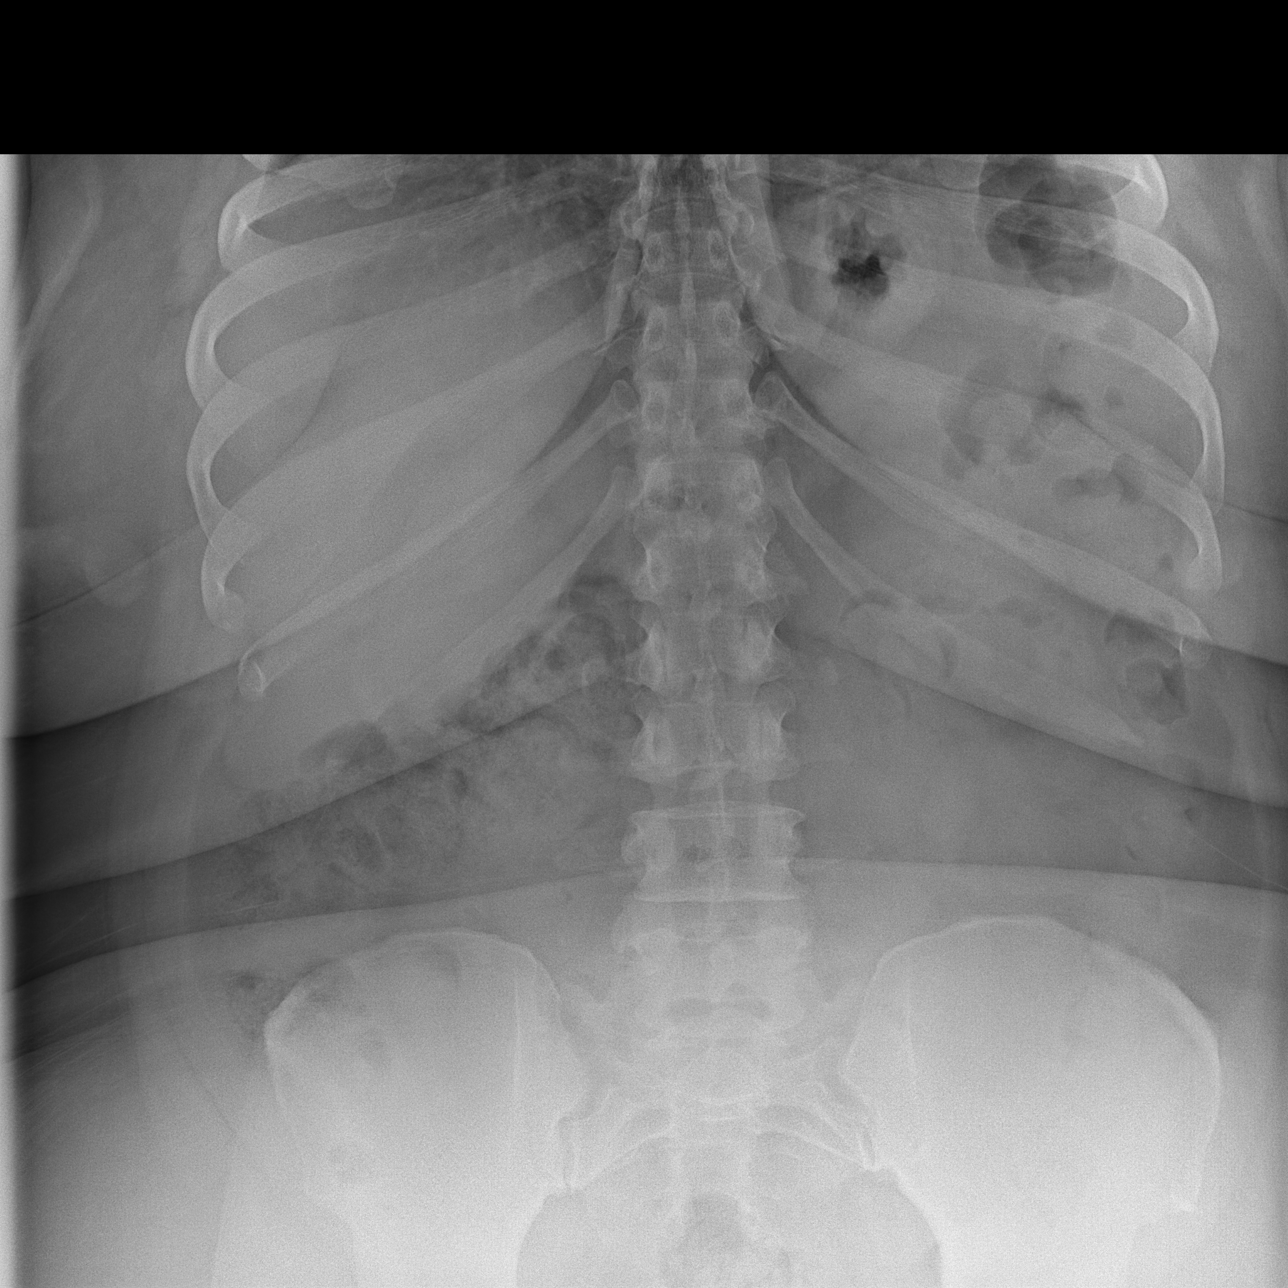

[t abdomen supine (1 of 2)]
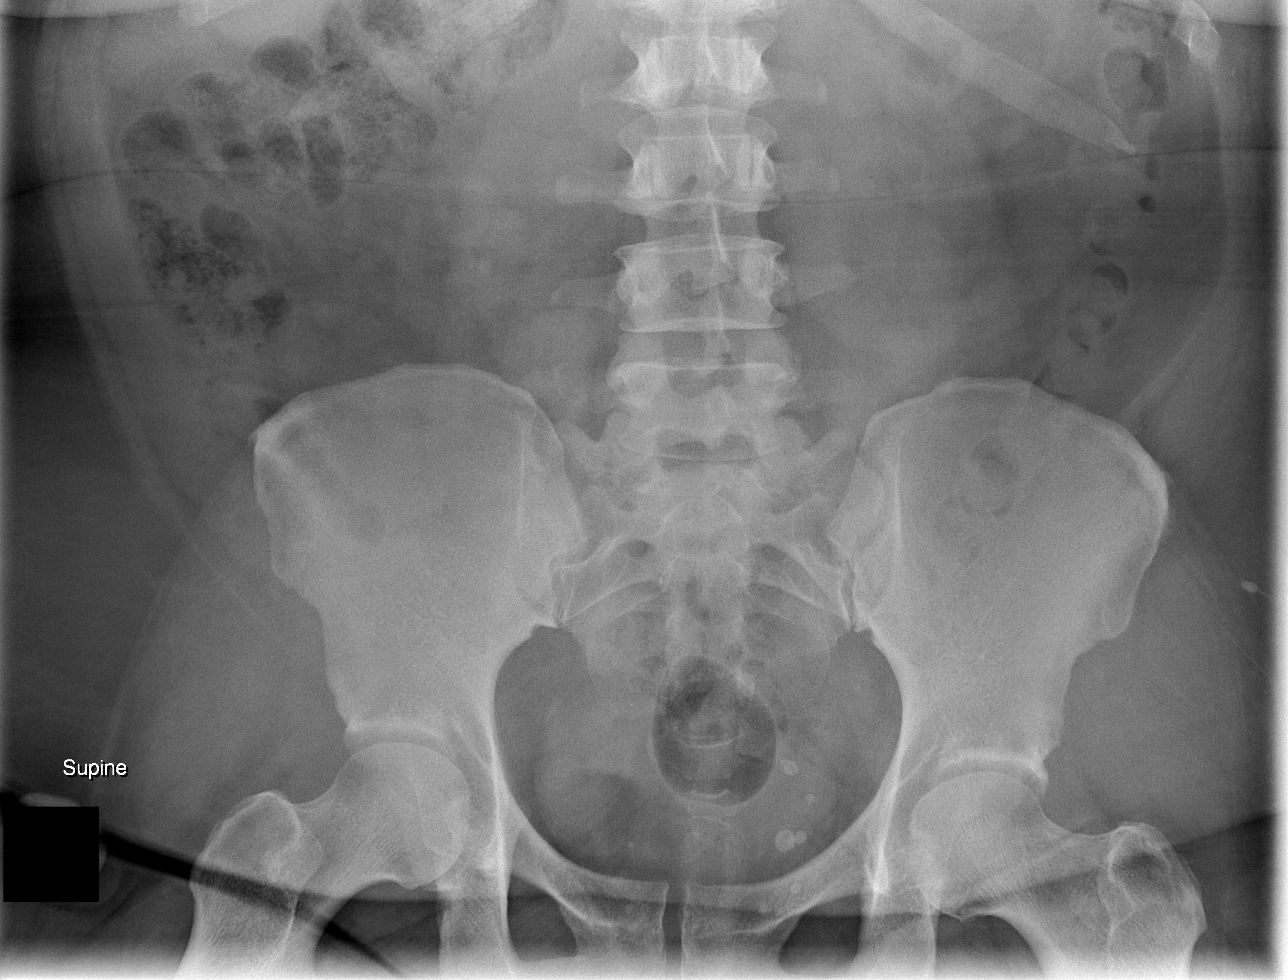

[t abdomen supine (2 of 2)]
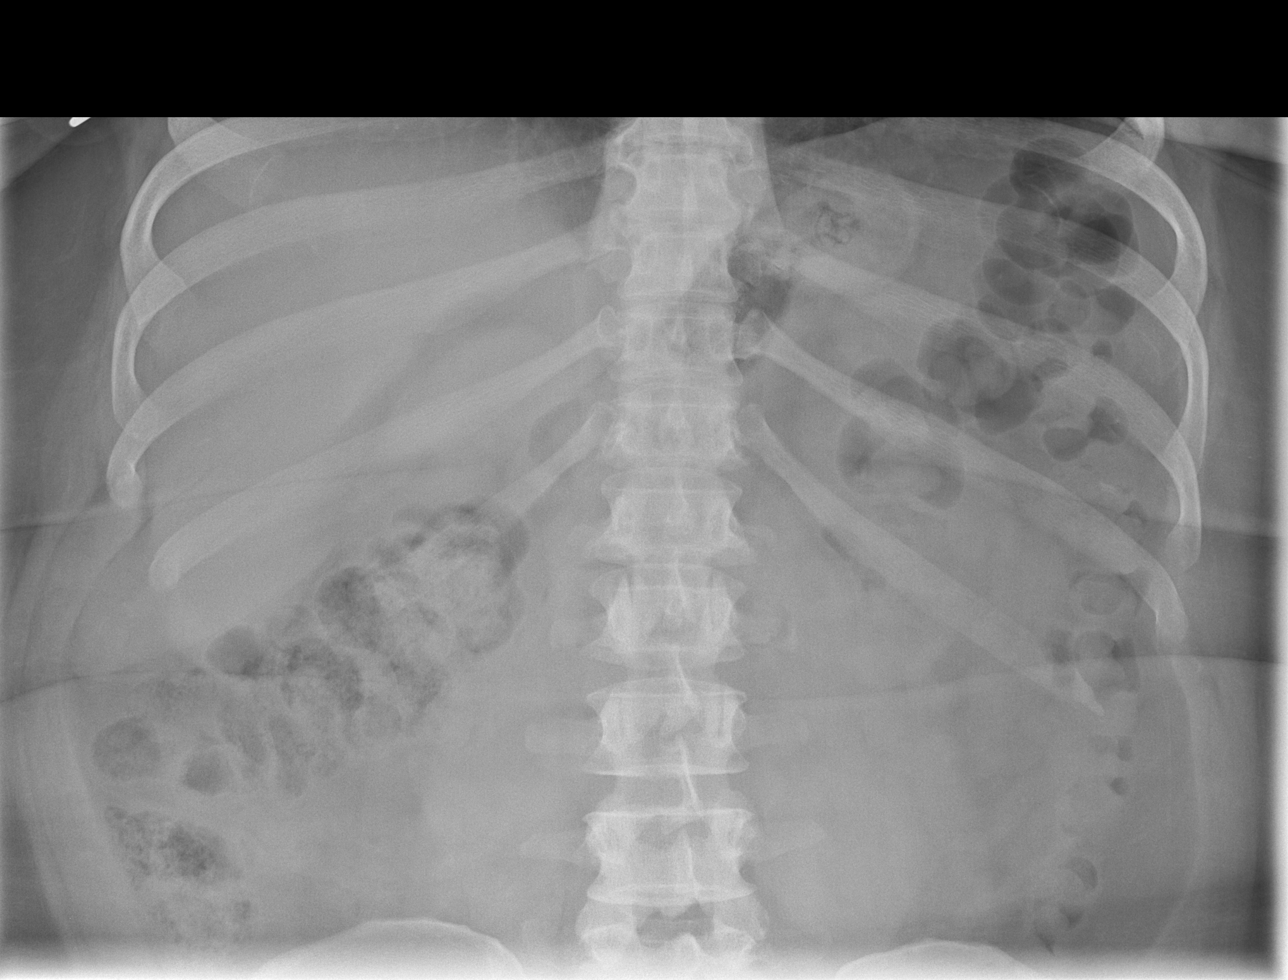

[3 of 3 positions shown; findings below may reference images not displayed]

FINDINGS: There is nonspecific nonobstructive bowel gas pattern.
Moderate stool noted in the right colon and transverse colon.  Some
stool noted in proximal descending colon.  No free abdominal air.
IMPRESSION: Nonspecific nonobstructive bowel gas pattern.  Moderate stool noted
in proximal colon.  Some stool noted in descending colon.

## 2012-12-05 IMAGING — US US ABDOMEN COMPLETE
1 series · 14 of 25 positions shown · non-contrast
Comparison: None.

CLINICAL DATA: Abdominal pain.

COMPLETE ABDOMINAL ULTRASOUND

[Series 1: us abdomen complete · 0.36mm/px · 14 of 69 slices shown]
[im 1/69]
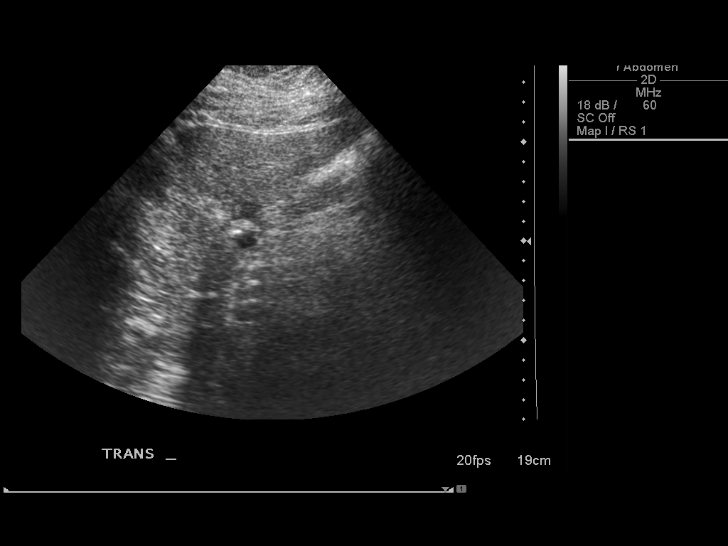
[im 6/69]
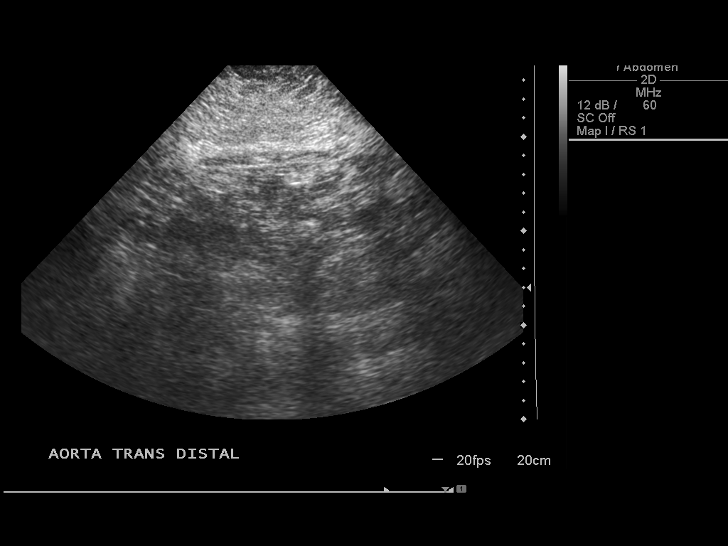
[im 12/69]
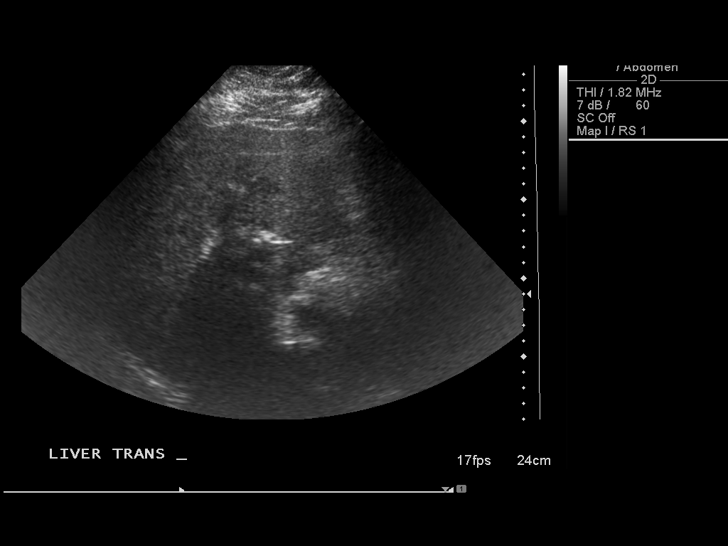
[im 18/69]
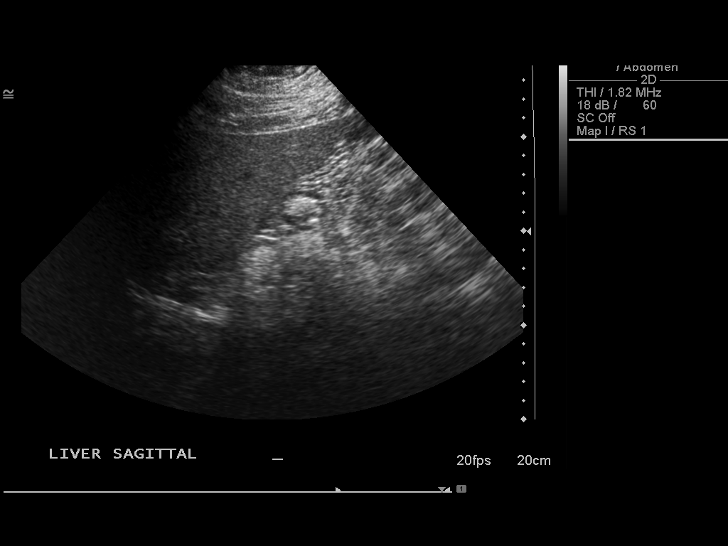
[im 23/69]
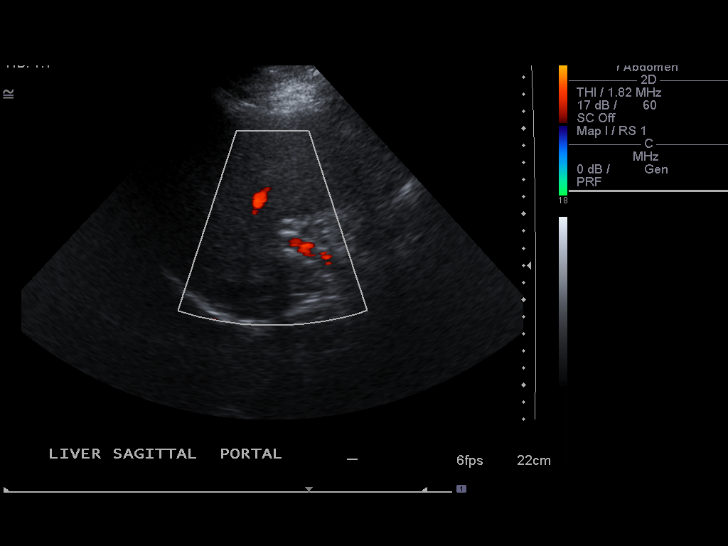
[im 26/69]
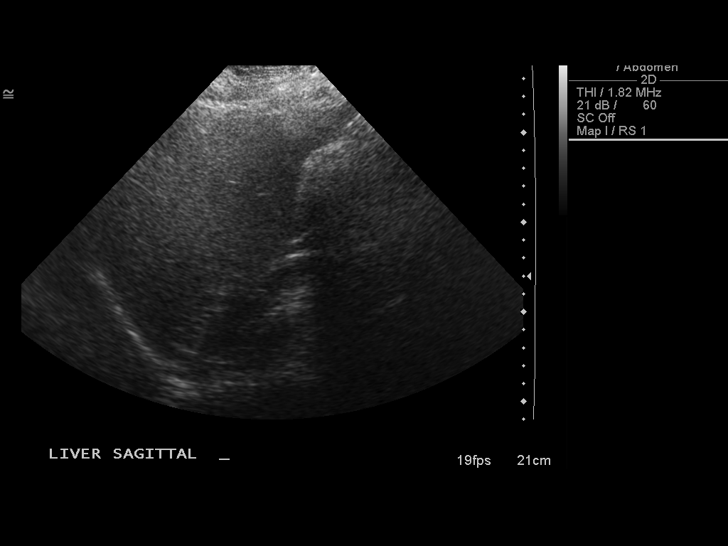
[im 32/69]
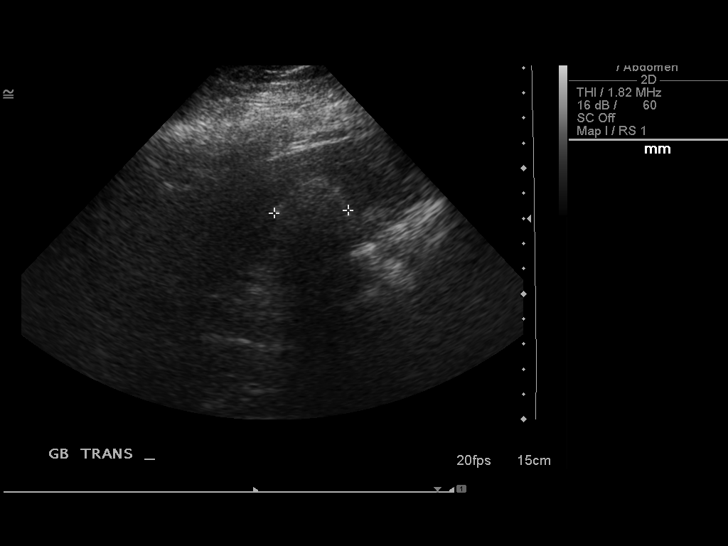
[im 37/69]
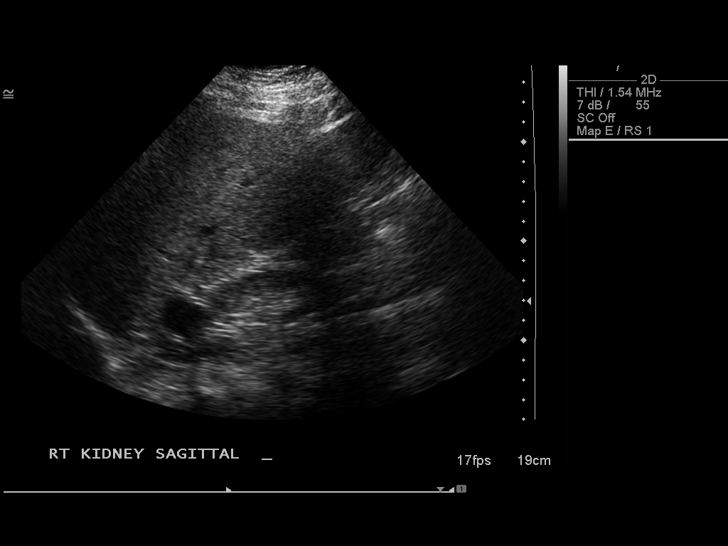
[im 43/69]
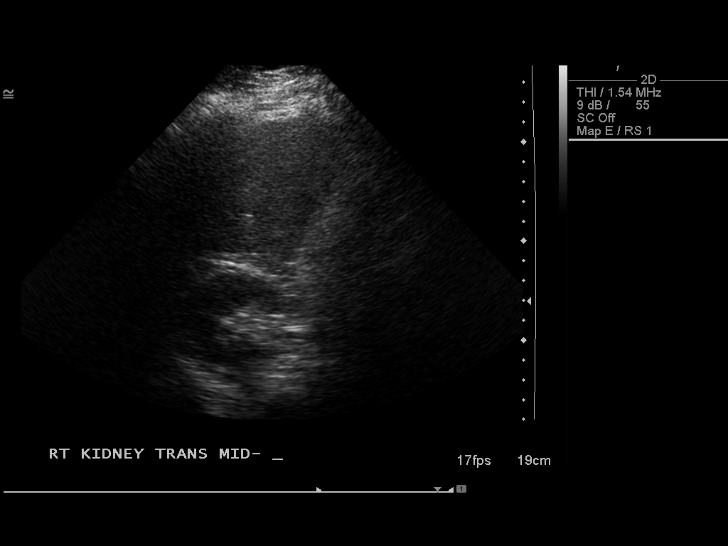
[im 46/69]
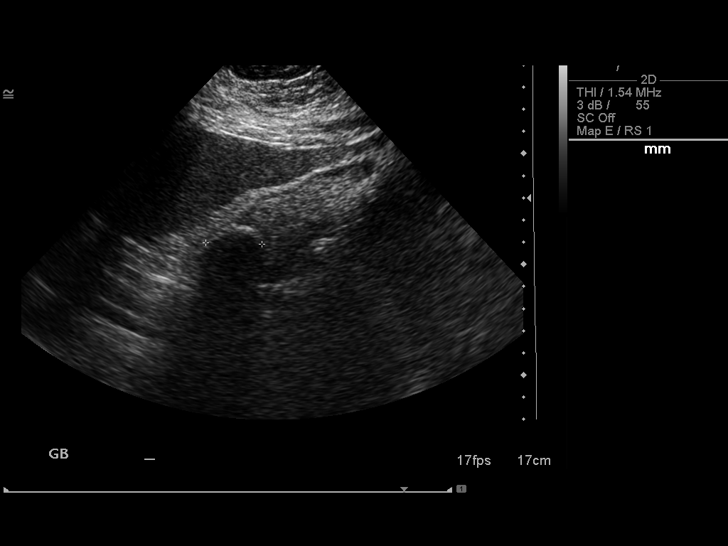
[im 52/69]
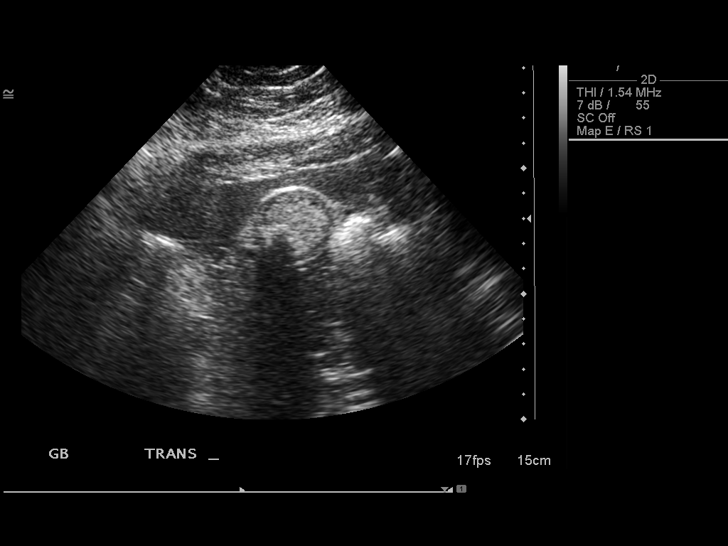
[im 57/69]
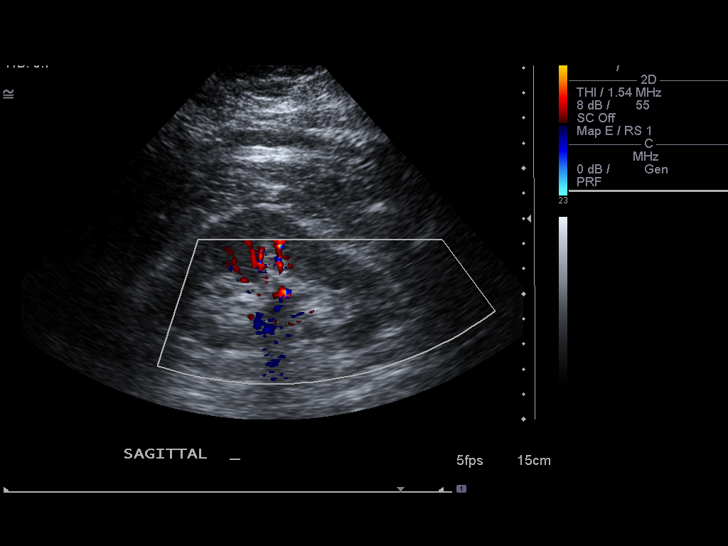
[im 63/69]
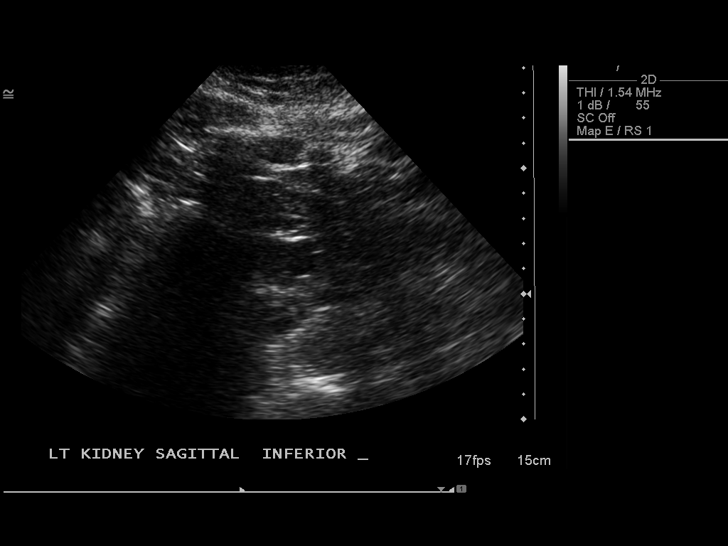
[im 69/69]
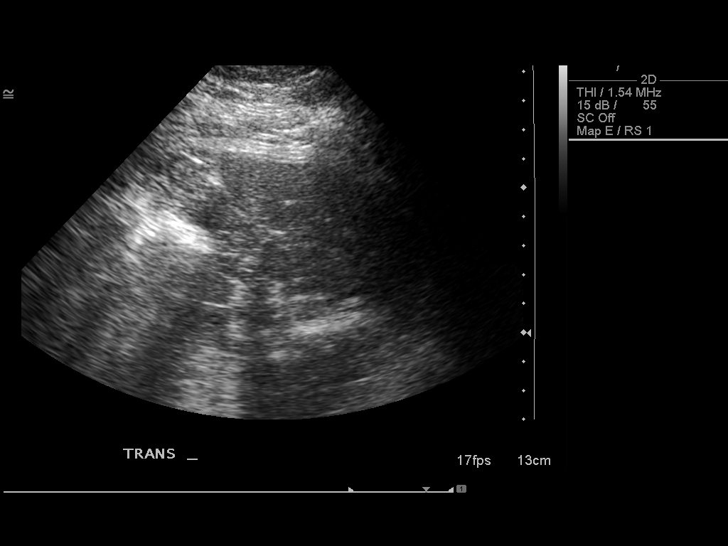

[14 of 25 positions shown; findings below may reference images not displayed]

FINDINGS: Gallbladder:  2.5 cm gallstone.  Diffuse echogenicity throughout
the gallbladder which may represent sludge.  Gallbladder wall
appears thickened measuring up to 5 mm.  Question pericholecystic
fluid.  The patient is tender over this region during scanning.

Common bile duct:  7.9 mm.

Liver:  Evaluation limited by habitus.

IVC:  Evaluation limited by habitus.

Pancreas:  Evaluation limited by habitus.

Spleen:  5.9 cm.

Right Kidney:  10.3 cm.  No hydronephrosis.  Right upper pole
cm cyst.

Left Kidney:  10.5 cm.  No hydronephrosis.  Lower pole 1.8 cm cyst.

Abdominal aorta:  Evaluation limited by habitus.
IMPRESSION: Markedly abnormal appearance of the gallbladder.  There is at least
a 2.5 cm gallstone with diffuse increased echogenicity throughout
remainder of gallbladder (sludge versus stones versus abnormal
mucosa).  Gallbladder wall thickening as well as tenderness of this
region suggesting acute inflammatory process.

Remainder of exam is limited by patient's habitus.

Results discussed with Dr. Jumper.

## 2012-12-06 IMAGING — RF DG CHOLANGIOGRAM OPERATIVE
1 series · 4 of 4 positions shown · non-contrast
Comparison: Abdominal ultrasound 01/04/2012.

CLINICAL DATA: Laparoscopic cholecystectomy.  Question common
bile duct stone.

INTRAOPERATIVE CHOLANGIOGRAM
TECHNIQUE: Cholangiographic images from the C-arm fluoroscopic
device were submitted for interpretation post-operatively.  Please
see the procedural report for the amount of contrast and the
fluoroscopy time utilized.

[Series 1: run · 4 of 113 frames shown]
[frame 9/113]
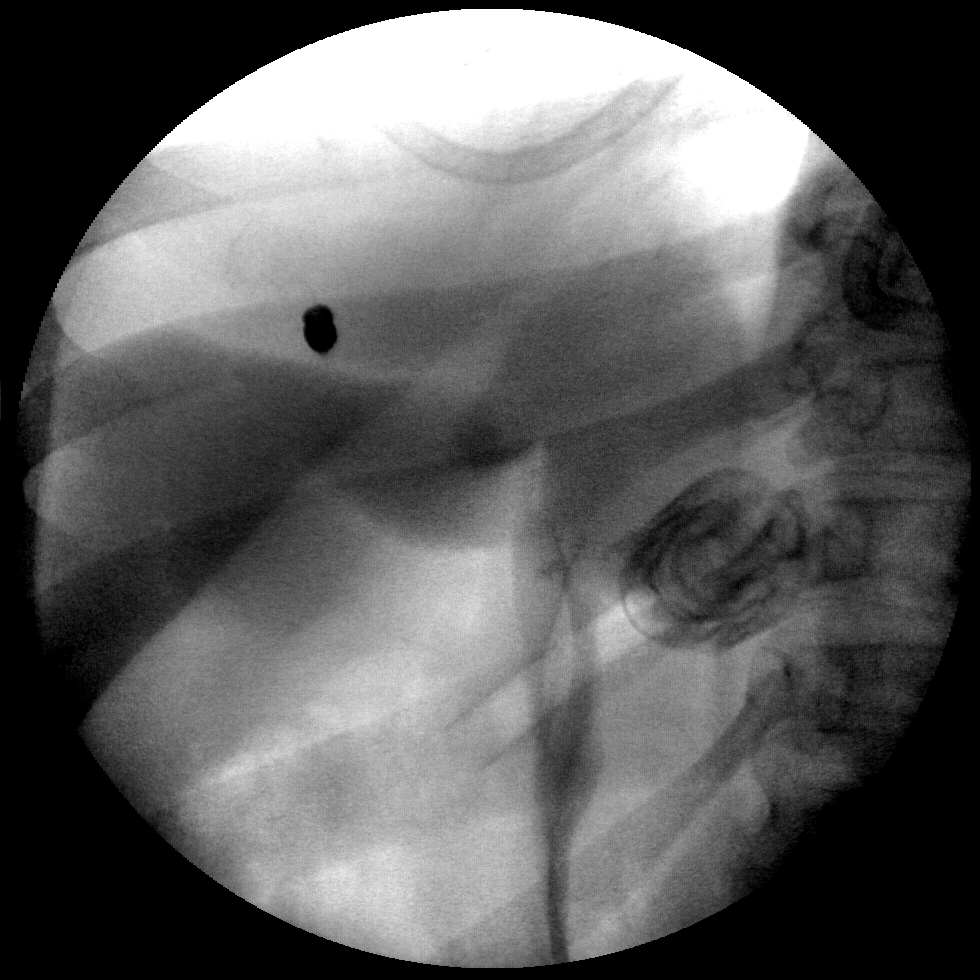
[frame 17/113]
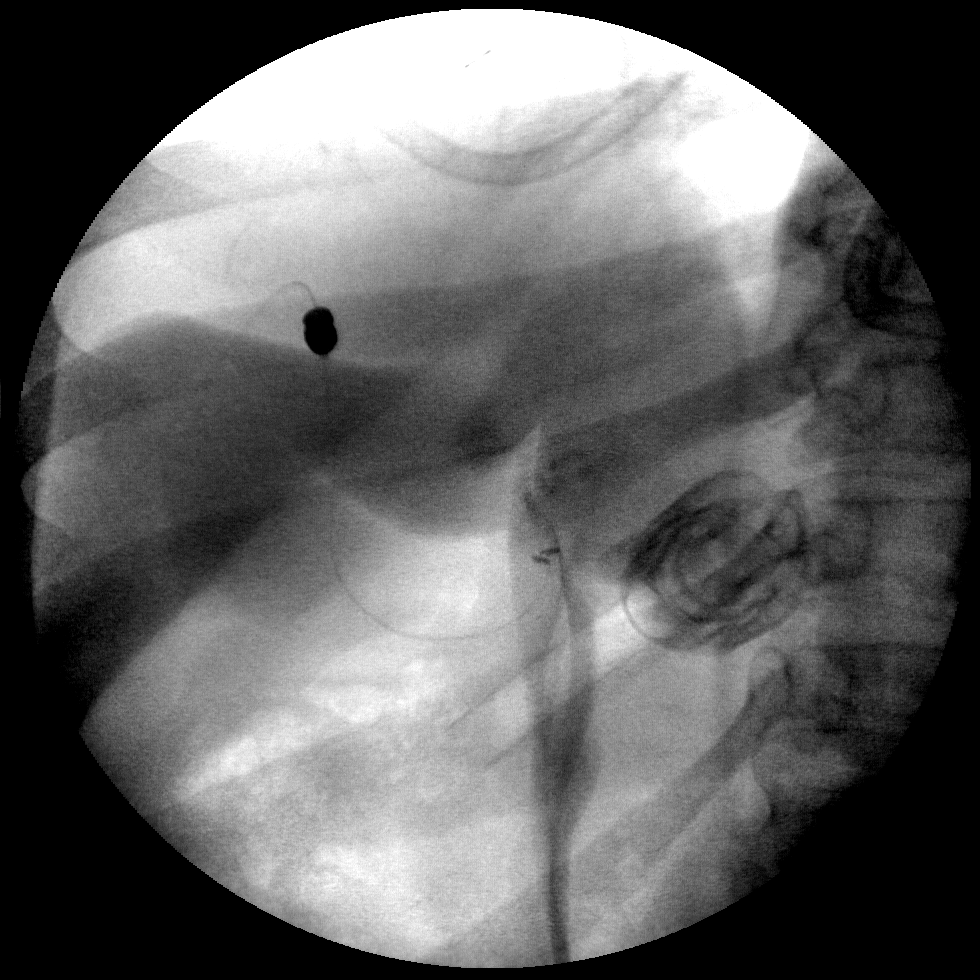
[frame 57/113]
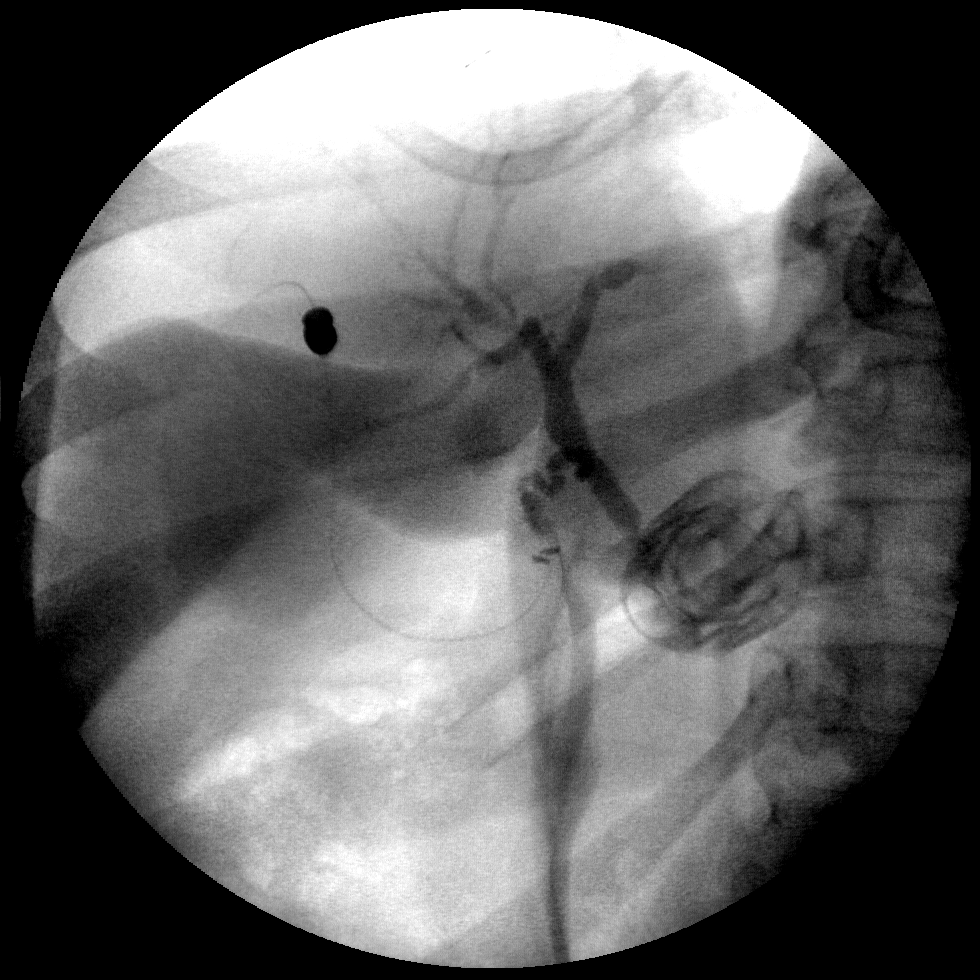
[frame 97/113]
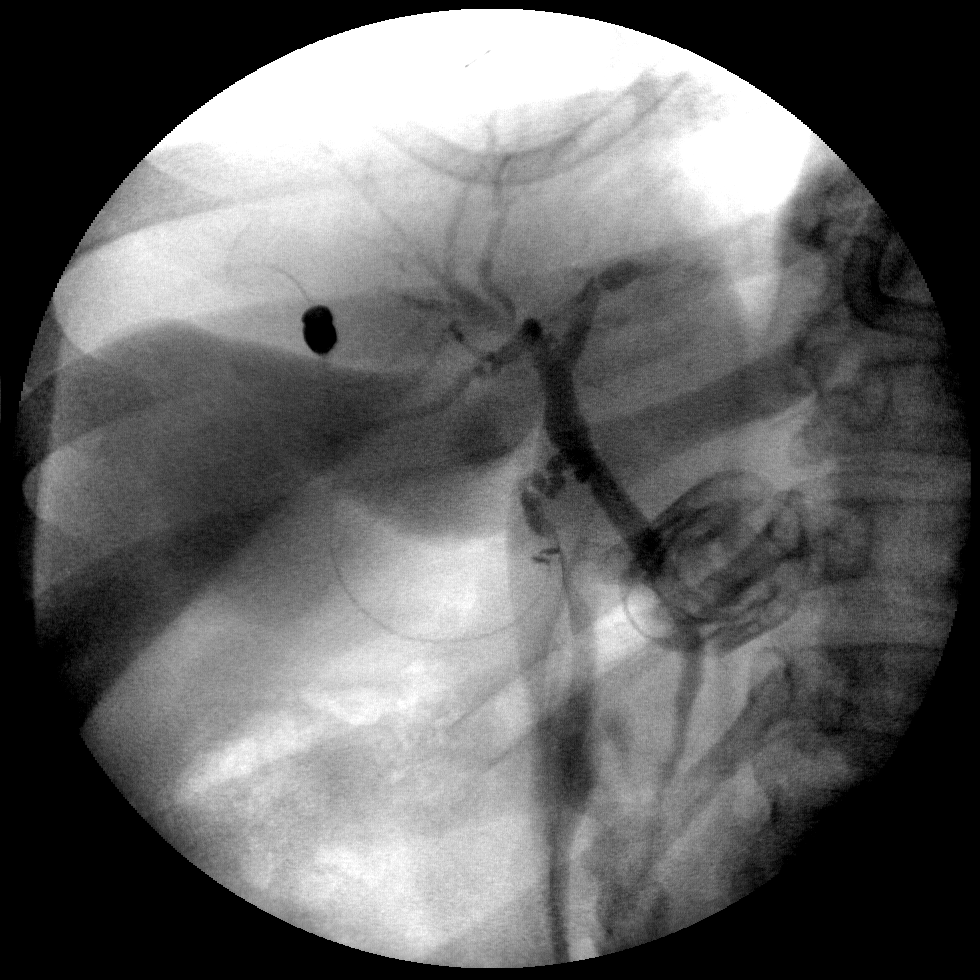

[4 of 4 positions shown; findings below may reference images not displayed]

FINDINGS: Injection of the cystic duct remnant demonstrates no
biliary dilatation or retained calculus.  There is drainage into
the duodenum.  No extravasation is evident.
IMPRESSION: No evidence of retained calculus or ductal obstruction.

## 2013-01-01 ENCOUNTER — Encounter (HOSPITAL_COMMUNITY): Payer: Self-pay | Admitting: *Deleted

## 2013-01-01 ENCOUNTER — Emergency Department (HOSPITAL_COMMUNITY)
Admission: EM | Admit: 2013-01-01 | Discharge: 2013-01-01 | Disposition: A | Discharge status: Home / Self Care | Payer: Medicaid Other | Attending: Emergency Medicine | Admitting: Emergency Medicine

## 2013-01-01 ENCOUNTER — Emergency Department (HOSPITAL_COMMUNITY): Payer: Medicaid Other

## 2013-01-01 DIAGNOSIS — Y939 Activity, unspecified: Secondary | ICD-10-CM | POA: Insufficient documentation

## 2013-01-01 DIAGNOSIS — G43909 Migraine, unspecified, not intractable, without status migrainosus: Secondary | ICD-10-CM | POA: Insufficient documentation

## 2013-01-01 DIAGNOSIS — Z7982 Long term (current) use of aspirin: Secondary | ICD-10-CM | POA: Insufficient documentation

## 2013-01-01 DIAGNOSIS — IMO0002 Reserved for concepts with insufficient information to code with codable children: Secondary | ICD-10-CM | POA: Insufficient documentation

## 2013-01-01 DIAGNOSIS — Z79899 Other long term (current) drug therapy: Secondary | ICD-10-CM | POA: Insufficient documentation

## 2013-01-01 DIAGNOSIS — Z8673 Personal history of transient ischemic attack (TIA), and cerebral infarction without residual deficits: Secondary | ICD-10-CM | POA: Insufficient documentation

## 2013-01-01 DIAGNOSIS — E041 Nontoxic single thyroid nodule: Secondary | ICD-10-CM | POA: Insufficient documentation

## 2013-01-01 DIAGNOSIS — G4730 Sleep apnea, unspecified: Secondary | ICD-10-CM | POA: Insufficient documentation

## 2013-01-01 DIAGNOSIS — I10 Essential (primary) hypertension: Secondary | ICD-10-CM | POA: Insufficient documentation

## 2013-01-01 DIAGNOSIS — M129 Arthropathy, unspecified: Secondary | ICD-10-CM | POA: Insufficient documentation

## 2013-01-01 DIAGNOSIS — Y929 Unspecified place or not applicable: Secondary | ICD-10-CM | POA: Insufficient documentation

## 2013-01-01 DIAGNOSIS — E7800 Pure hypercholesterolemia, unspecified: Secondary | ICD-10-CM | POA: Insufficient documentation

## 2013-01-01 DIAGNOSIS — R079 Chest pain, unspecified: Secondary | ICD-10-CM | POA: Insufficient documentation

## 2013-01-01 DIAGNOSIS — S39012A Strain of muscle, fascia and tendon of lower back, initial encounter: Secondary | ICD-10-CM

## 2013-01-01 DIAGNOSIS — X58XXXA Exposure to other specified factors, initial encounter: Secondary | ICD-10-CM | POA: Insufficient documentation

## 2013-01-01 DIAGNOSIS — Z8719 Personal history of other diseases of the digestive system: Secondary | ICD-10-CM | POA: Insufficient documentation

## 2013-01-01 LAB — TROPONIN I: Troponin I: <0.3 ng/mL (ref <0.30)

## 2013-01-01 LAB — CBC WITH DIFFERENTIAL/PLATELET
Basophils Absolute: 0.1 10*3/uL (ref 0.0–0.1)
Basophils Relative: 0 % (ref 0–1)
Eosinophils Absolute: 0.1 10*3/uL (ref 0.0–0.7)
Eosinophils Relative: 1 % (ref 0–5)
HCT: 39.4 % (ref 36.0–46.0)
Hemoglobin: 13.3 g/dL (ref 12.0–15.0)
Lymphocytes Relative: 37 % (ref 12–46)
Lymphs Abs: 4.6 10*3/uL — ABNORMAL HIGH (ref 0.7–4.0)
MCH: 28.6 pg (ref 26.0–34.0)
MCHC: 33.8 g/dL (ref 30.0–36.0)
MCV: 84.7 fL (ref 78.0–100.0)
Monocytes Absolute: 1.1 10*3/uL — ABNORMAL HIGH (ref 0.1–1.0)
Monocytes Relative: 9 % (ref 3–12)
Neutro Abs: 6.7 10*3/uL (ref 1.7–7.7)
Neutrophils Relative %: 53 % (ref 43–77)
Platelets: 300 10*3/uL (ref 150–400)
RBC: 4.65 MIL/uL (ref 3.87–5.11)
RDW: 14.9 % (ref 11.5–15.5)
WBC: 12.5 10*3/uL — ABNORMAL HIGH (ref 4.0–10.5)

## 2013-01-01 LAB — COMPREHENSIVE METABOLIC PANEL
ALT: 16 U/L (ref 0–35)
AST: 18 U/L (ref 0–37)
Albumin: 3.4 g/dL — ABNORMAL LOW (ref 3.5–5.2)
Alkaline Phosphatase: 75 U/L (ref 39–117)
BUN: 14 mg/dL (ref 6–23)
CO2: 29 mEq/L (ref 19–32)
Calcium: 9.5 mg/dL (ref 8.4–10.5)
Chloride: 102 mEq/L (ref 96–112)
Creatinine, Ser: 0.98 mg/dL (ref 0.50–1.10)
GFR calc Af Amer: 75 mL/min — ABNORMAL LOW (ref >90)
GFR calc non Af Amer: 65 mL/min — ABNORMAL LOW (ref >90)
Glucose, Bld: 88 mg/dL (ref 70–99)
Potassium: 3.6 mEq/L (ref 3.5–5.1)
Sodium: 139 mEq/L (ref 135–145)
Total Bilirubin: 0.2 mg/dL — ABNORMAL LOW (ref 0.3–1.2)
Total Protein: 7.1 g/dL (ref 6.0–8.3)

## 2013-01-01 MED ORDER — OXYCODONE-ACETAMINOPHEN 5-325 MG PO TABS: 2.0000 | ORAL_TABLET | ORAL | Status: DC | PRN

## 2013-01-01 MED ORDER — HYDROMORPHONE HCL PF 1 MG/ML IJ SOLN
1.0000 mg | Freq: Once | INTRAMUSCULAR | Status: AC
  Administered 2013-01-01: 1 mg via INTRAVENOUS
  Filled 2013-01-01: qty 1

## 2013-01-01 MED ORDER — SODIUM CHLORIDE 0.9 % IV BOLUS (SEPSIS)
1000.0000 mL | Freq: Once | INTRAVENOUS | Status: AC
  Administered 2013-01-01: 1000 mL via INTRAVENOUS

## 2013-01-01 MED ORDER — DIAZEPAM 5 MG/ML IJ SOLN
5.0000 mg | Freq: Once | INTRAMUSCULAR | Status: AC
  Administered 2013-01-01: 5 mg via INTRAVENOUS
  Filled 2013-01-01: qty 2

## 2013-01-01 MED ORDER — DIAZEPAM 5 MG PO TABS: 5.0000 mg | ORAL_TABLET | Freq: Four times a day (QID) | ORAL | Status: DC | PRN

## 2013-01-01 NOTE — ED Notes (Signed)
The pt is c/o posterior chest pain through to the anterior chest  For 2 days.  No rash visible.  The pain is worse with movement and inspiration and tender to the touch.  Some associated sob.  No distress

## 2013-01-01 NOTE — ED Provider Notes (Addendum)
History     CSN: [id number]  Arrival date & time 01/01/13  1942   First MD Initiated Contact with Patient 01/01/13 1953      Chief Complaint  Patient presents with  . Chest Pain    (Consider location/radiation/quality/duration/timing/severity/associated sxs/prior treatment) The history is provided by the patient.  Jodi Bowman is a 52 y.o. female history of hypertension, hyperlipidemia, chronic back pain here with worsening back pain and chest pain. The last 2 days she's been having mid thoracic back pain that radiated to the front of her chest. There is no rash but she said it was very severe. No fevers or chills or cough. Denies any history of CAD she does have a history of high cholesterol hypertension. No history of PE or DVT. Took percocet without improvement.    Past Medical History  Diagnosis Date  . Hypertension   . Hypercholesteremia   . Lower back pain   . Nontoxic uninodular goiter     sees dr vollmer at [facility]  . Calculus of gallbladder without mention of cholecystitis or obstruction   . Arthritis   . Stroke 2000  . Migraine   . Sleep apnea     STOPBANG=5    Past Surgical History  Procedure Laterality Date  . Knee arthroscopy  one 1995 and 1 in 1997    both knees done  . Abdominal hysterectomy    . Cesarean section  yrs ago    done x 2  . Surgery for endometriosis  yrs ago  . Thryoid biopsy  December 01, 2011     at [facility]  . Cholecystectomy  01/05/2012    Procedure: LAPAROSCOPIC CHOLECYSTECTOMY WITH INTRAOPERATIVE CHOLANGIOGRAM;  Surgeon: Valarie Merino, MD;  Location: [facility];  Service: General;  Laterality: N/A;  . Colonoscopy  10/08/2012    Procedure: COLONOSCOPY;  Surgeon: Theda Belfast, MD;  Location: [facility];  Service: Endoscopy;  Laterality: N/A;    Family History  Problem Relation Age of Onset  . Cancer Brother     colon and lung  . Cancer Maternal Grandmother     colon    History  Substance Use Topics  . Smoking status: Never  Smoker   . Smokeless tobacco: Never Used  . Alcohol Use: No    OB History   Grav Para Term Preterm Abortions TAB SAB Ect Mult Living                  Review of Systems  Cardiovascular: Positive for chest pain.  Musculoskeletal: Positive for back pain.  All other systems reviewed and are negative.    Allergies  Shrimp and Naproxen  Home Medications   Current Outpatient Rx  Name  Route  Sig  Dispense  Refill  . amLODipine (NORVASC) 5 MG tablet   Oral   Take 5 mg by mouth every morning.          Marland Kitchen aspirin EC 81 MG tablet   Oral   Take 81 mg by mouth daily.         Marland Kitchen lisinopril-hydrochlorothiazide (PRINZIDE,ZESTORETIC) 20-25 MG per tablet   Oral   Take 1 tablet by mouth every morning.          . loratadine (CLARITIN) 10 MG tablet   Oral   Take 10 mg by mouth every morning.          . metoprolol succinate (TOPROL-XL) 50 MG 24 hr tablet   Oral   Take 50 mg by  mouth 2 (two) times daily. Take with or immediately following a meal.         . pravastatin (PRAVACHOL) 40 MG tablet   Oral   Take 40 mg by mouth at bedtime.            BP 197/104  Pulse 90  Temp(Src) 98.4 F (36.9 C) (Oral)  Resp 18  SpO2 97%  Physical Exam  Nursing note and vitals reviewed. Constitutional: She is oriented to person, place, and time. She appears well-developed.  Obese, uncomfortable   HENT:  Head: Normocephalic.  Mouth/Throat: Oropharynx is clear and moist.  Eyes: Conjunctivae are normal. Pupils are equal, round, and reactive to light.  Neck: Normal range of motion. Neck supple.  Cardiovascular: Normal rate, regular rhythm and normal heart sounds.   Pulmonary/Chest: Effort normal and breath sounds normal. No respiratory distress. She has no wheezes. She has no rales. She exhibits no tenderness.  Abdominal: Soft. Bowel sounds are normal. She exhibits no distension. There is no tenderness. There is no rebound and no guarding.  Musculoskeletal: Normal range of motion.  +  tenderness along L parathoracic spine. No midline tenderness. No rash in that area   Neurological: She is alert and oriented to person, place, and time.  Skin: Skin is warm and dry.  Psychiatric: She has a normal mood and affect. Her behavior is normal. Judgment and thought content normal.    ED Course  Procedures (including critical care time)  Labs Reviewed  CBC WITH DIFFERENTIAL - Abnormal; Notable for the following:    WBC 12.5 (*)    Lymphs Abs 4.6 (*)    Monocytes Absolute 1.1 (*)    All other components within normal limits  COMPREHENSIVE METABOLIC PANEL - Abnormal; Notable for the following:    Albumin 3.4 (*)    Total Bilirubin 0.2 (*)    GFR calc non Af Amer 65 (*)    GFR calc Af Amer 75 (*)    All other components within normal limits  TROPONIN I   Dg Chest 2 View  01/01/2013  *RADIOLOGY REPORT*  Clinical Data: Back pain between shoulder blades for 3 days radiating to the chest.  CHEST - 2 VIEW  Comparison: 09/22/2011  Findings: Shallow inspiration. The heart size and pulmonary vascularity are normal. The lungs appear clear and expanded without focal air space disease or consolidation. No blunting of the costophrenic angles.  No pneumothorax.  Mediastinal contours appear intact.  Surgical clips in the upper abdomen.  No significant change since previous study.  IMPRESSION: No evidence of active pulmonary disease.   Original Report Authenticated By: Burman Nieves, M.D.      No diagnosis found.   Date: 01/01/2013  Rate: 90  Rhythm: normal sinus rhythm  QRS Axis: normal  Intervals: normal  ST/T Wave abnormalities: normal  Conduction Disutrbances:none  Narrative Interpretation:   Old EKG Reviewed: none available    MDM  Jodi Bowman is a 52 y.o. female here with back pain, chest pain. Atypical for ACS and symptoms for 2 days so trop x 1 sufficient. No rash to suggest shingles. Not concerned for PE or dissection. Likely muscle strain. Will check trop, CXR. Will  give pain meds and reassess.   10:13 PM Pain improved. Trop neg x 1. Will d/c home on short course of percocet and valium. Patient doesn't want to take NSAIDs as it upsets her stomach. Was hypertensive on arrival, BP on dc was 164/45, no signs of hypertensive urgency.  Richardean Canal, MD 01/01/13 2213  Richardean Canal, MD 01/01/13 2214  Richardean Canal, MD 01/01/13 2215

## 2013-03-11 ENCOUNTER — Encounter (HOSPITAL_COMMUNITY): Payer: Self-pay | Admitting: Emergency Medicine

## 2013-03-11 ENCOUNTER — Emergency Department (HOSPITAL_COMMUNITY)
Admission: EM | Admit: 2013-03-11 | Discharge: 2013-03-12 | Discharge status: Against Medical Advice | Payer: Medicaid Other | Attending: Emergency Medicine | Admitting: Emergency Medicine

## 2013-03-11 DIAGNOSIS — I10 Essential (primary) hypertension: Secondary | ICD-10-CM | POA: Insufficient documentation

## 2013-03-11 DIAGNOSIS — G8929 Other chronic pain: Secondary | ICD-10-CM | POA: Insufficient documentation

## 2013-03-11 DIAGNOSIS — M549 Dorsalgia, unspecified: Secondary | ICD-10-CM | POA: Insufficient documentation

## 2013-03-11 NOTE — ED Notes (Signed)
PT. REPORTED THAT SHE RAN OUT OF HER PRESCRIPTION PERCOCET AND MUSCLE RELAXANT FOR HER CHRONIC BACK PAIN ( RHEUMATOID ARTHRITIS ) , SCHEDULED FOR AN APPOINTMENT WITH RHEUMATOLOGIST ON July 10 . , DENIES INJURY / AMBULATORY.

## 2013-03-12 NOTE — ED Notes (Signed)
Went to check on pt for hourly rounding. Pt not to be found in the room or in the ED. EDP made aware of pt leaving AMA.

## 2013-04-11 IMAGING — CR DG TIBIA/FIBULA 2V*L*
4 series · 4 of 4 positions shown · non-contrast
Comparison: None.

CLINICAL DATA: Status post fall.  Pain.

LEFT TIBIA AND FIBULA - 2 VIEW

[t tib/fib ap left (1 of 2)]
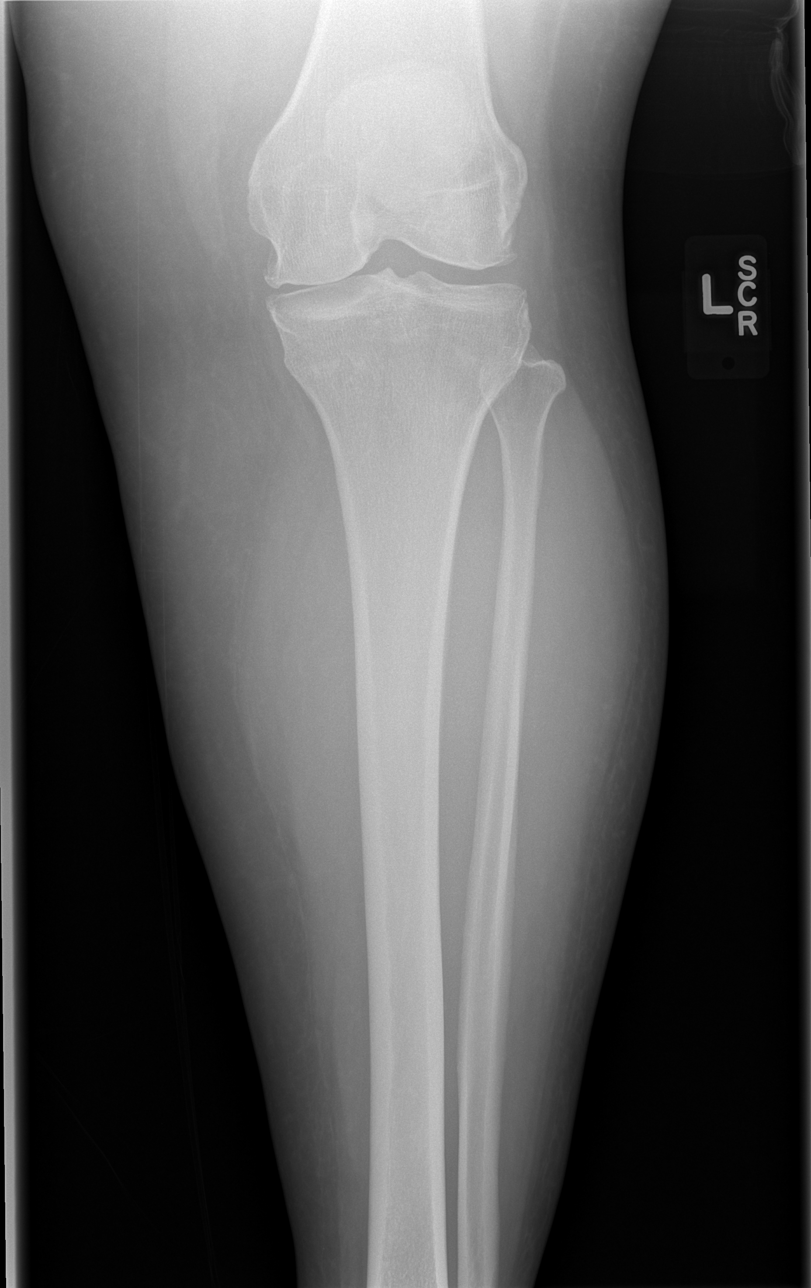

[t tib/fib ap left (2 of 2)]
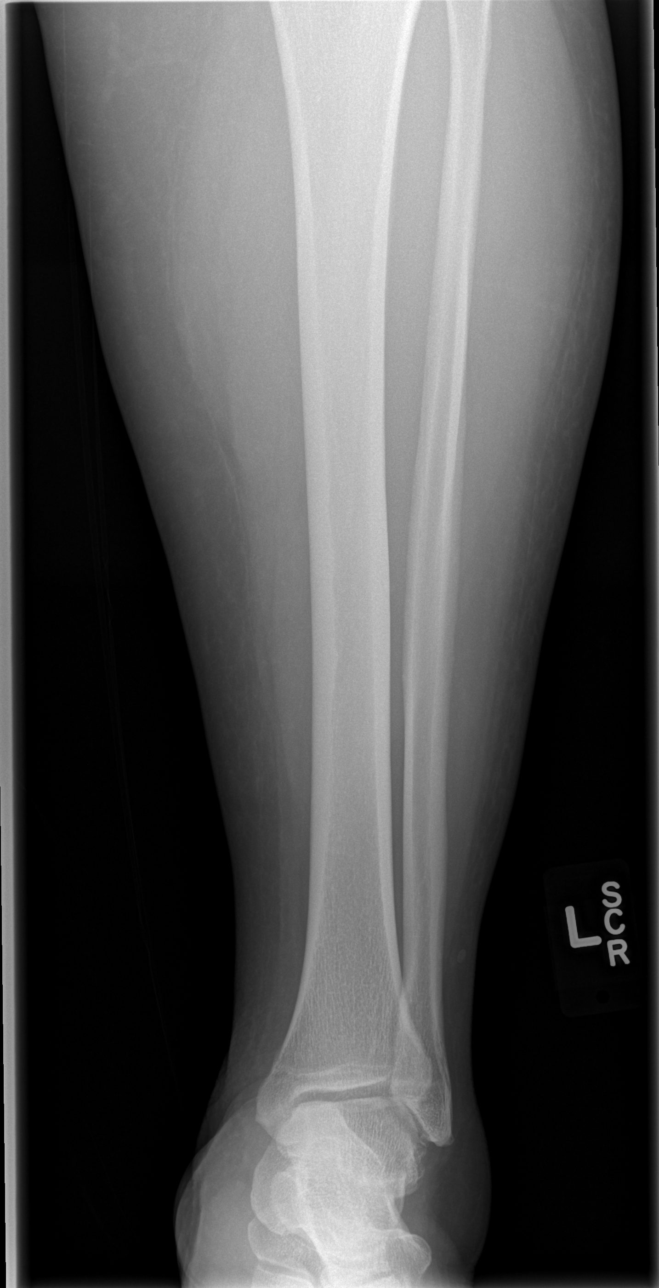

[t tib/fib lat left (1 of 2)]
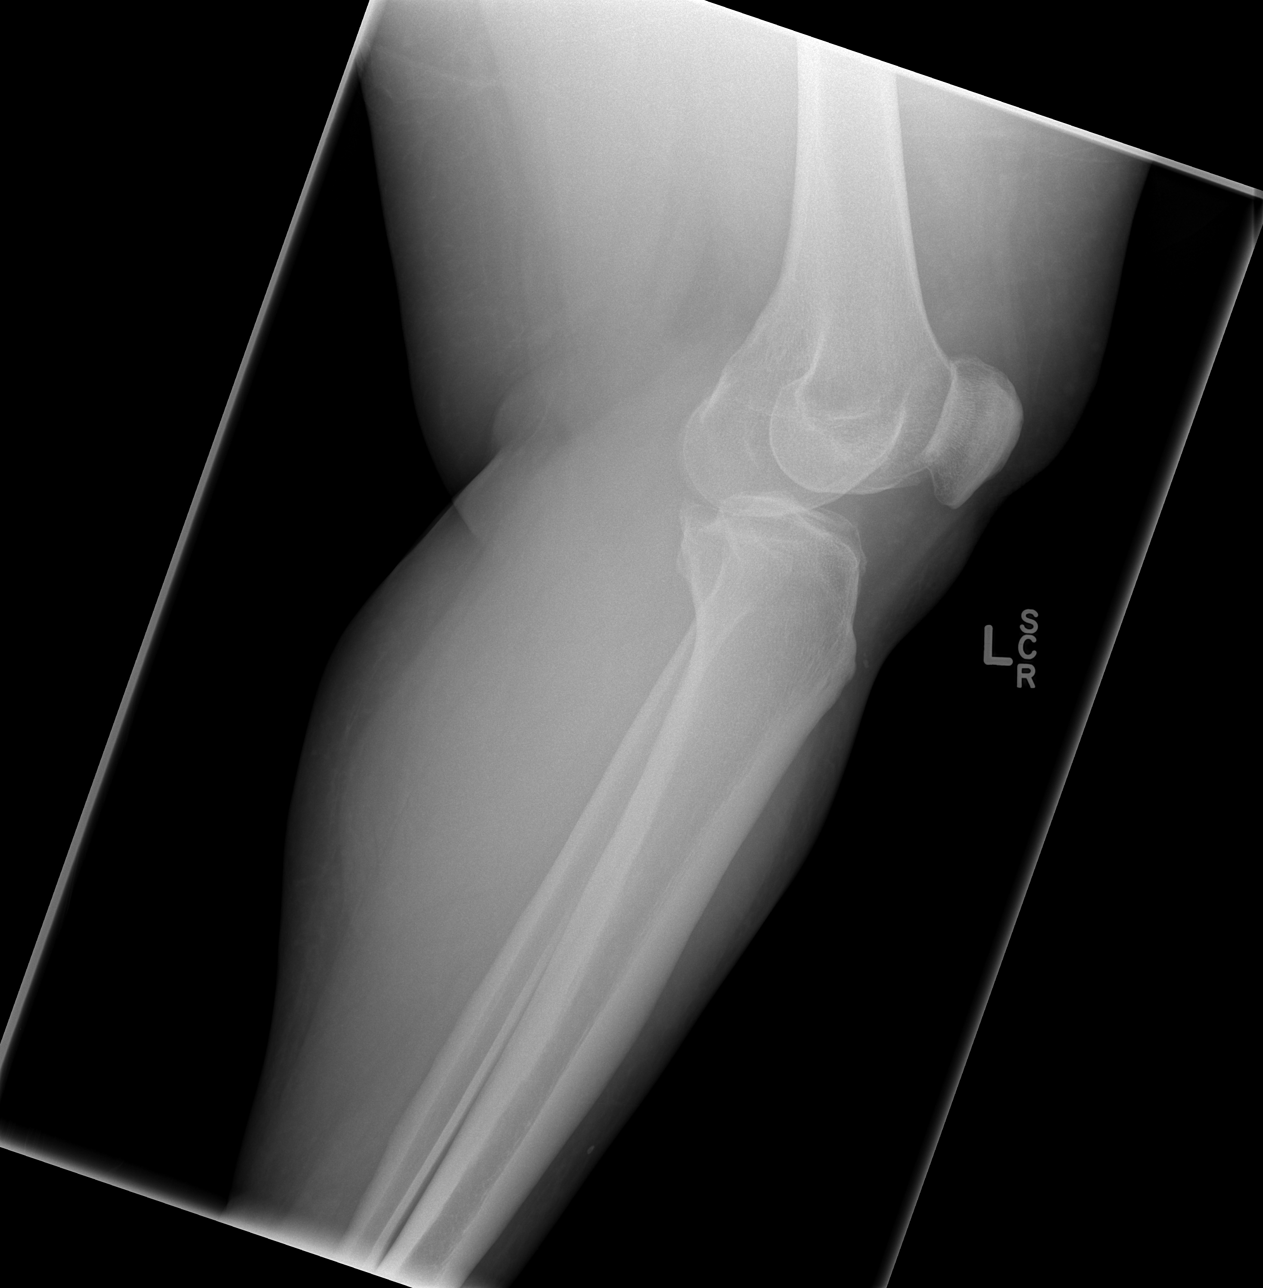

[t tib/fib lat left (2 of 2)]
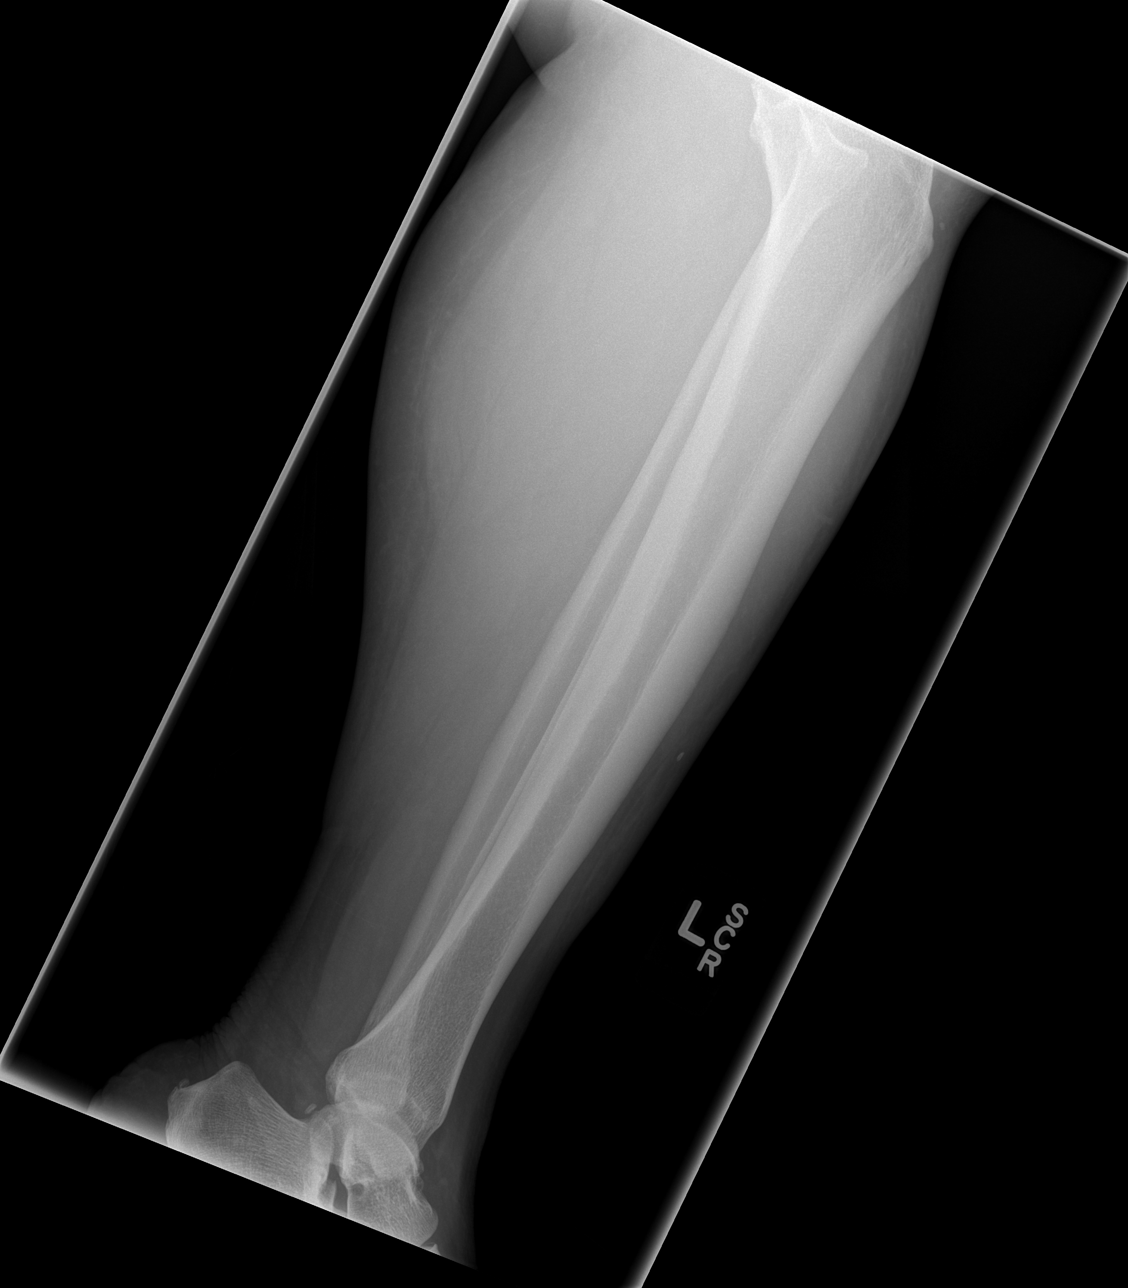

[4 of 4 positions shown; findings below may reference images not displayed]

FINDINGS: There is no acute bony or joint abnormality.  The patient
has some degenerative change about the knee.  Soft tissue
structures are unremarkable.
IMPRESSION: No acute finding.

## 2013-05-01 IMAGING — CR DG HIP (WITH OR WITHOUT PELVIS) 2-3V*L*
3 series · 3 of 3 positions shown · non-contrast
Comparison: None.

CLINICAL DATA: Left hip pain since a fall on 05/11/2012.

LEFT HIP - COMPLETE 2+ VIEW

[t pelvis a.p.]
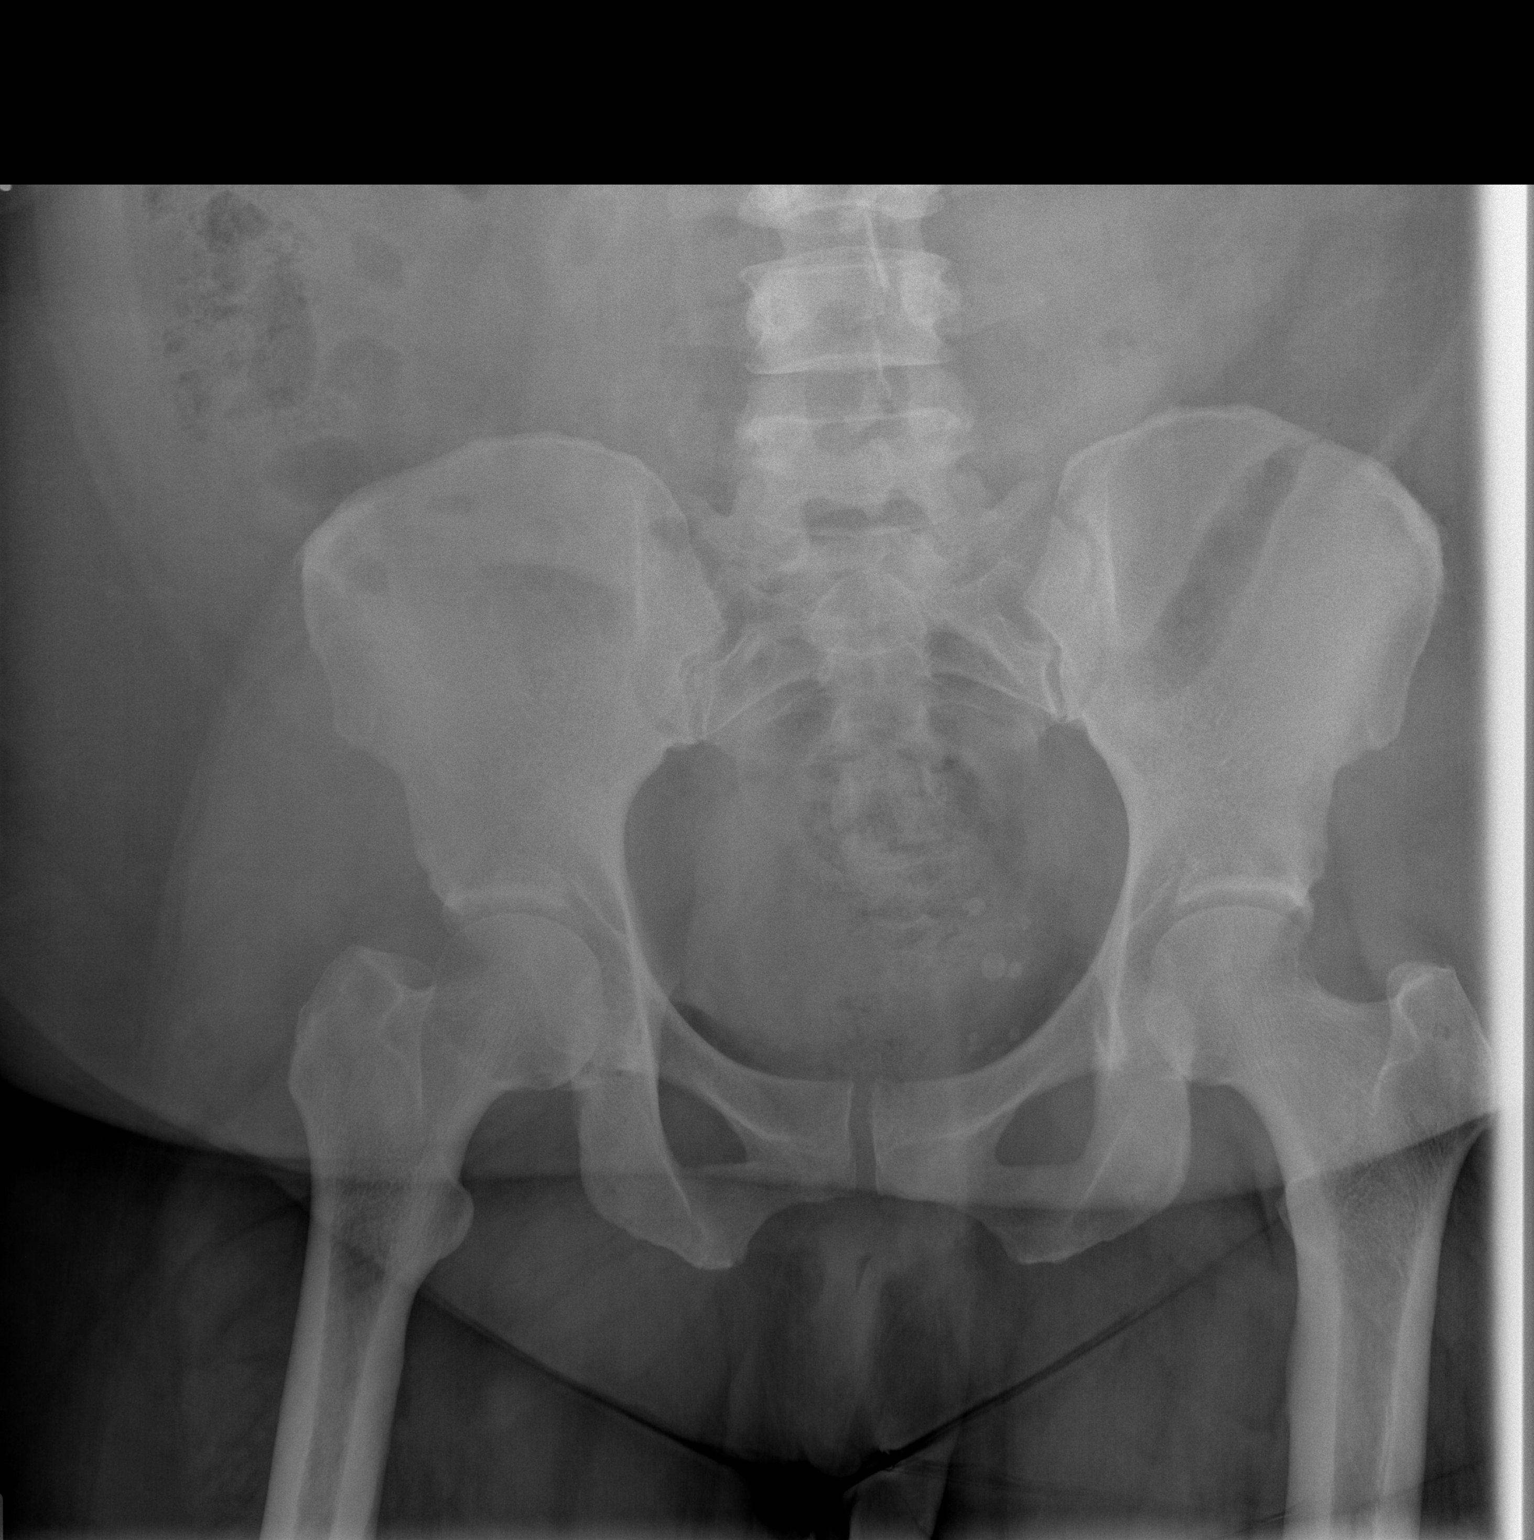

[t hip ap left]
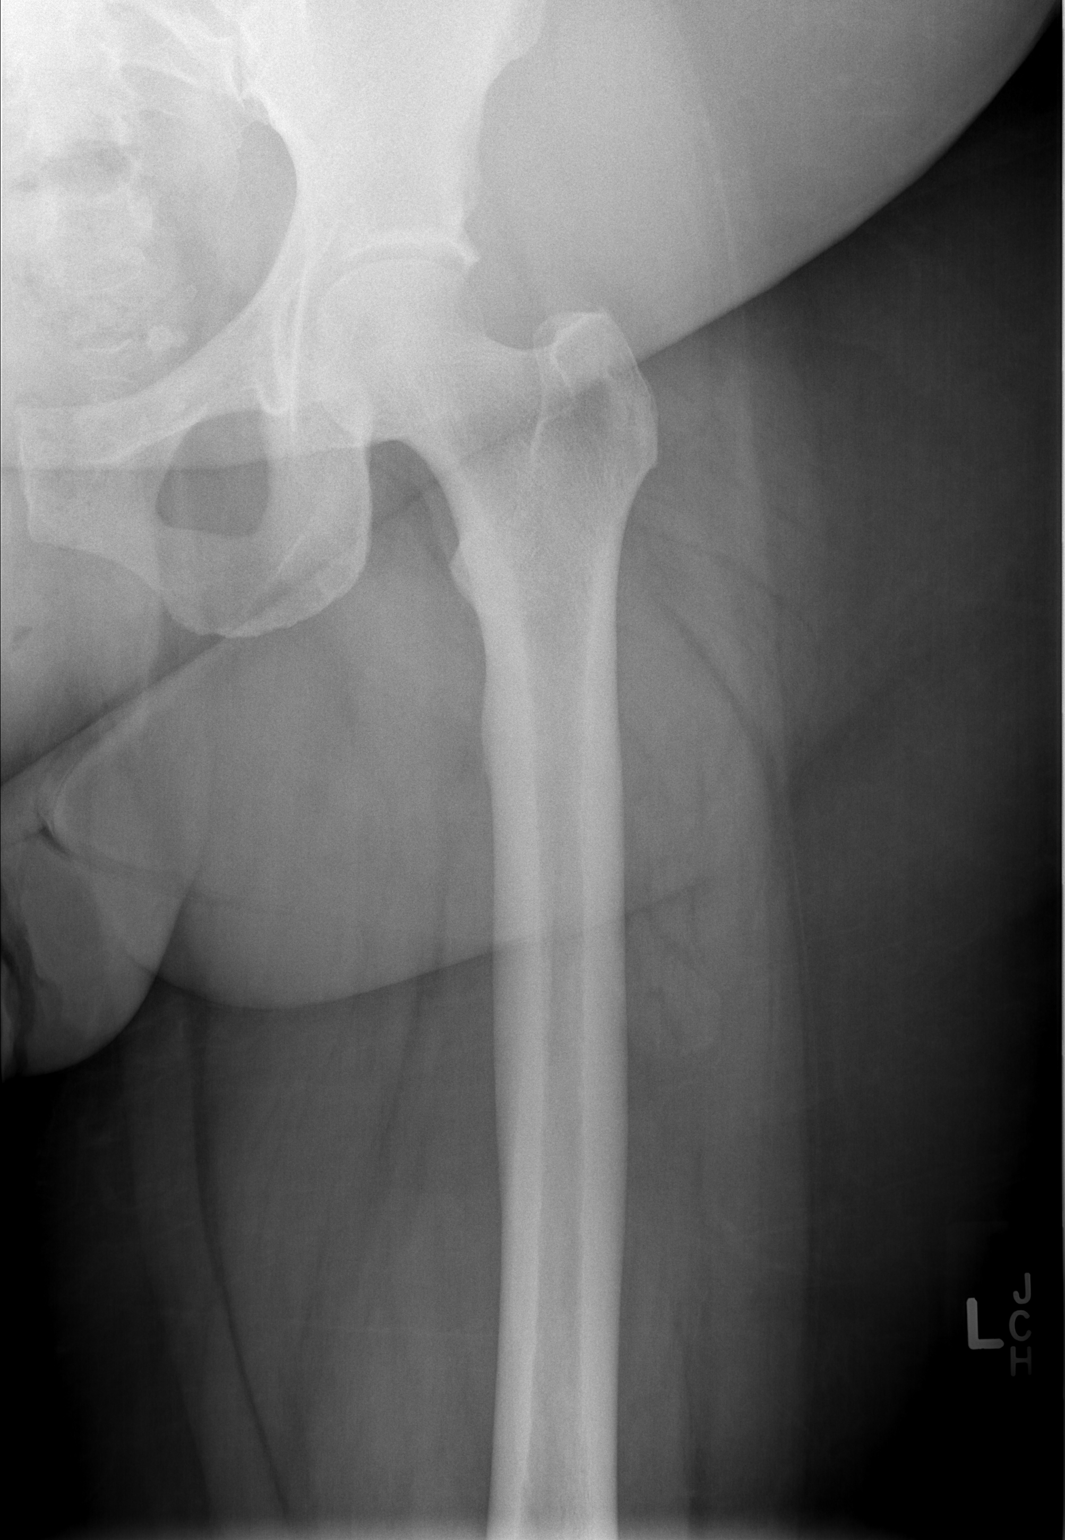

[t hip frog leg left]
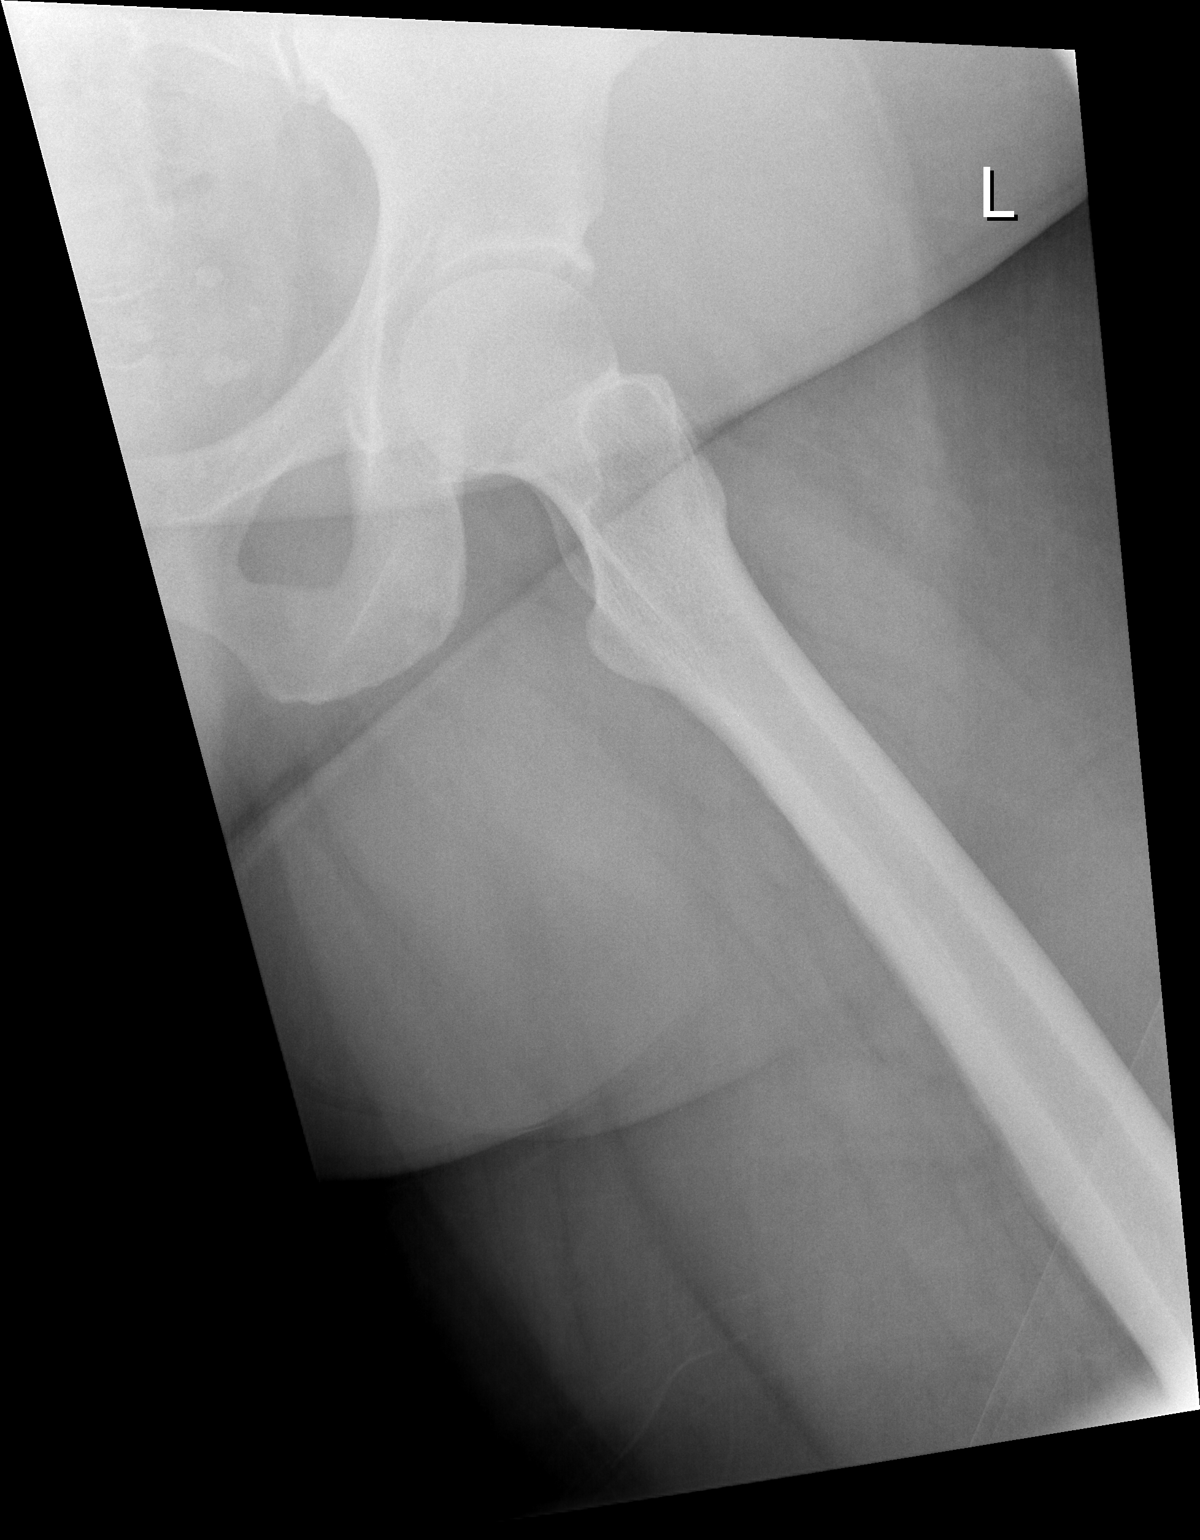

[3 of 3 positions shown; findings below may reference images not displayed]

FINDINGS: Normal appearing left hip, pelvic bones and lower lumbar
spine.  No fracture or dislocation seen.
IMPRESSION: No fracture or dislocation.

## 2013-07-23 ENCOUNTER — Emergency Department (HOSPITAL_COMMUNITY)
Admission: EM | Admit: 2013-07-23 | Discharge: 2013-07-23 | Disposition: A | Discharge status: Home / Self Care | Payer: Medicaid Other | Attending: Emergency Medicine | Admitting: Emergency Medicine

## 2013-07-23 DIAGNOSIS — Z7982 Long term (current) use of aspirin: Secondary | ICD-10-CM | POA: Insufficient documentation

## 2013-07-23 DIAGNOSIS — J329 Chronic sinusitis, unspecified: Secondary | ICD-10-CM

## 2013-07-23 DIAGNOSIS — R490 Dysphonia: Secondary | ICD-10-CM | POA: Insufficient documentation

## 2013-07-23 DIAGNOSIS — Z8673 Personal history of transient ischemic attack (TIA), and cerebral infarction without residual deficits: Secondary | ICD-10-CM | POA: Insufficient documentation

## 2013-07-23 DIAGNOSIS — I10 Essential (primary) hypertension: Secondary | ICD-10-CM | POA: Insufficient documentation

## 2013-07-23 DIAGNOSIS — Z79899 Other long term (current) drug therapy: Secondary | ICD-10-CM | POA: Insufficient documentation

## 2013-07-23 DIAGNOSIS — Z8719 Personal history of other diseases of the digestive system: Secondary | ICD-10-CM | POA: Insufficient documentation

## 2013-07-23 DIAGNOSIS — M129 Arthropathy, unspecified: Secondary | ICD-10-CM | POA: Insufficient documentation

## 2013-07-23 DIAGNOSIS — G43909 Migraine, unspecified, not intractable, without status migrainosus: Secondary | ICD-10-CM | POA: Insufficient documentation

## 2013-07-23 DIAGNOSIS — E7800 Pure hypercholesterolemia, unspecified: Secondary | ICD-10-CM | POA: Insufficient documentation

## 2013-07-23 DIAGNOSIS — M62838 Other muscle spasm: Secondary | ICD-10-CM

## 2013-07-23 MED ORDER — DEXTROMETHORPHAN-GUAIFENESIN 10-200 MG PO CAPS: 1.0000 | ORAL_CAPSULE | Freq: Four times a day (QID) | ORAL | Status: DC

## 2013-07-23 NOTE — ED Provider Notes (Signed)
Medical screening examination/treatment/procedure(s) were performed by non-physician practitioner and as supervising physician I was immediately available for consultation/collaboration.      Gwyneth Sprout, MD 07/23/13 2213

## 2013-07-23 NOTE — ED Provider Notes (Signed)
CSN: [id number]     Arrival date & time 07/23/13  1930 History   None   This chart was scribed for non-physician practitioner, Earley Favor, FNP working with Gwyneth Sprout, MD by Arlan Organ, ED Scribe. This patient was seen in room [facility] and the patient's care was started at 7:42 PM.   Chief Complaint  Patient presents with  . Recurrent Sinusitis  . Spasms   The history is provided by the patient.   HPI Comments: Jodi Bowman is a 52 y.o. female who presents to the Emergency Department complaining of a  Sinus congestion, for the past 3, days, and one day of hoarseness.  She has tried Flonase without success. Pt also states she has been experiencing associated muscle spasms, for, which she has seen her primary care physician and has been taking intermittently.  A muscle relaxer, and oxycodone, which she, states, is only giving her mild relief.  She has not tried any heat or cold. Pt is allergic to naproxen.      Past Medical History  Diagnosis Date  . Hypertension   . Hypercholesteremia   . Lower back pain   . Nontoxic uninodular goiter     sees dr vollmer at [facility]  . Calculus of gallbladder without mention of cholecystitis or obstruction   . Arthritis   . Stroke 2000  . Migraine   . Sleep apnea     STOPBANG=5   Past Surgical History  Procedure Laterality Date  . Knee arthroscopy  one 1995 and 1 in 1997    both knees done  . Abdominal hysterectomy    . Cesarean section  yrs ago    done x 2  . Surgery for endometriosis  yrs ago  . Thryoid biopsy  December 01, 2011     at [facility]  . Cholecystectomy  01/05/2012    Procedure: LAPAROSCOPIC CHOLECYSTECTOMY WITH INTRAOPERATIVE CHOLANGIOGRAM;  Surgeon: Valarie Merino, MD;  Location: [facility];  Service: General;  Laterality: N/A;  . Colonoscopy  10/08/2012    Procedure: COLONOSCOPY;  Surgeon: Theda Belfast, MD;  Location: [facility];  Service: Endoscopy;  Laterality: N/A;   Family History  Problem Relation Age of Onset   . Cancer Brother     colon and lung  . Cancer Maternal Grandmother     colon   History  Substance Use Topics  . Smoking status: Never Smoker   . Smokeless tobacco: Never Used  . Alcohol Use: No   OB History   Grav Para Term Preterm Abortions TAB SAB Ect Mult Living                 Review of Systems  Constitutional: Negative for fever and chills.  HENT: Positive for voice change. Negative for trouble swallowing.   Respiratory: Positive for cough. Negative for shortness of breath and wheezing.   Musculoskeletal: Positive for neck pain. Negative for neck stiffness.  Skin: Negative for rash and wound.  All other systems reviewed and are negative.    Allergies  Shrimp and Naproxen  Home Medications   Current Outpatient Rx  Name  Route  Sig  Dispense  Refill  . amLODipine (NORVASC) 5 MG tablet   Oral   Take 5 mg by mouth every morning.          Marland Kitchen aspirin EC 81 MG tablet   Oral   Take 81 mg by mouth daily.         Marland Kitchen Dextromethorphan-Guaifenesin (CORICIDIN HBP CONGESTION/COUGH)  10-200 MG CAPS   Oral   Take 1 tablet by mouth 4 (four) times daily.   168 each   0   . lisinopril-hydrochlorothiazide (PRINZIDE,ZESTORETIC) 20-25 MG per tablet   Oral   Take 1 tablet by mouth every morning.          . loratadine (CLARITIN) 10 MG tablet   Oral   Take 10 mg by mouth every morning.          . metoprolol succinate (TOPROL-XL) 50 MG 24 hr tablet   Oral   Take 50 mg by mouth 2 (two) times daily. Take with or immediately following a meal.         . pravastatin (PRAVACHOL) 40 MG tablet   Oral   Take 40 mg by mouth at bedtime.           BP 220/109  Pulse 98  Temp(Src) 98.1 F (36.7 C) (Oral)  Resp 20  Wt 272 lb (123.378 kg)  BMI 54.91 kg/m2  SpO2 96%  Physical Exam  Nursing note and vitals reviewed. Constitutional: She is oriented to person, place, and time. She appears well-developed and well-nourished.  HENT:  Head: Normocephalic.  Left Ear:  External ear normal.  Right ear canal reddened and tender.  TM pearly, gray, and mobile Voice is hoarse  Eyes: Pupils are equal, round, and reactive to light.  Neck: Normal range of motion.  Cardiovascular: Normal rate and regular rhythm.   Pulmonary/Chest: Effort normal and breath sounds normal.  Musculoskeletal: Normal range of motion.  Lymphadenopathy:    She has cervical adenopathy.  Neurological: She is alert and oriented to person, place, and time.  Skin: No rash noted. No pallor.    ED Course  Procedures (including critical care time)  DIAGNOSTIC STUDIES: Oxygen Saturation is 96% on ra, Adequate by my interpretation.    COORDINATION OF CARE: 8:28 PM-Discussed treatment plan with pt at bedside and pt agreed to plan.     Labs Review Labs Reviewed - No data to display Imaging Review No results found.  EKG Interpretation   None       MDM   1. Sinusitis   2. Hoarseness   3. Muscle spasms of neck      I personally performed the services described in this documentation, which was scribed in my presence. The recorded information has been reviewed and is accurate.  Arman Filter, NP 07/23/13 2027  Arman Filter, NP 07/23/13 2028

## 2013-07-23 NOTE — ED Notes (Signed)
Pt states she has sinus pain, pressure, cough, sore throat and R ear pain. Pt also states she has muscle spasms in her back. Pt reports she has a hx of fibromyalgia. Pt ambulatory to exam room with steady gait.

## 2013-10-11 ENCOUNTER — Emergency Department (HOSPITAL_COMMUNITY)
Admission: EM | Admit: 2013-10-11 | Discharge: 2013-10-11 | Disposition: A | Discharge status: Home / Self Care | Payer: Medicaid Other | Attending: Emergency Medicine | Admitting: Emergency Medicine

## 2013-10-11 ENCOUNTER — Encounter (HOSPITAL_COMMUNITY): Payer: Self-pay | Admitting: Emergency Medicine

## 2013-10-11 DIAGNOSIS — IMO0001 Reserved for inherently not codable concepts without codable children: Secondary | ICD-10-CM | POA: Insufficient documentation

## 2013-10-11 DIAGNOSIS — Z79899 Other long term (current) drug therapy: Secondary | ICD-10-CM | POA: Insufficient documentation

## 2013-10-11 DIAGNOSIS — Z8673 Personal history of transient ischemic attack (TIA), and cerebral infarction without residual deficits: Secondary | ICD-10-CM | POA: Insufficient documentation

## 2013-10-11 DIAGNOSIS — M546 Pain in thoracic spine: Secondary | ICD-10-CM | POA: Insufficient documentation

## 2013-10-11 DIAGNOSIS — Z9889 Other specified postprocedural states: Secondary | ICD-10-CM | POA: Insufficient documentation

## 2013-10-11 DIAGNOSIS — M25559 Pain in unspecified hip: Secondary | ICD-10-CM | POA: Insufficient documentation

## 2013-10-11 DIAGNOSIS — Z7982 Long term (current) use of aspirin: Secondary | ICD-10-CM | POA: Insufficient documentation

## 2013-10-11 DIAGNOSIS — M549 Dorsalgia, unspecified: Secondary | ICD-10-CM

## 2013-10-11 DIAGNOSIS — Z8719 Personal history of other diseases of the digestive system: Secondary | ICD-10-CM | POA: Insufficient documentation

## 2013-10-11 DIAGNOSIS — M25552 Pain in left hip: Secondary | ICD-10-CM

## 2013-10-11 DIAGNOSIS — M129 Arthropathy, unspecified: Secondary | ICD-10-CM | POA: Insufficient documentation

## 2013-10-11 DIAGNOSIS — M797 Fibromyalgia: Secondary | ICD-10-CM

## 2013-10-11 DIAGNOSIS — I10 Essential (primary) hypertension: Secondary | ICD-10-CM | POA: Insufficient documentation

## 2013-10-11 MED ORDER — LORAZEPAM 2 MG/ML IJ SOLN
1.0000 mg | Freq: Once | INTRAMUSCULAR | Status: AC
  Administered 2013-10-11: 1 mg via INTRAMUSCULAR
  Filled 2013-10-11: qty 1

## 2013-10-11 MED ORDER — HYDROMORPHONE HCL PF 1 MG/ML IJ SOLN
1.0000 mg | Freq: Once | INTRAMUSCULAR | Status: AC
  Administered 2013-10-11: 1 mg via INTRAMUSCULAR
  Filled 2013-10-11: qty 1

## 2013-10-11 NOTE — Discharge Instructions (Signed)
Continue your regular medications. If pain is severe you can double the dose of your oxycodone for one dose. Follow up with your doctor or your specialist. Return if worsening.   Fibromyalgia Fibromyalgia is a disorder that is often misunderstood. It is associated with muscular pains and tenderness that comes and goes. It is often associated with fatigue and sleep disturbances. Though it tends to be long-lasting, fibromyalgia is not life-threatening. CAUSES  The exact cause of fibromyalgia is unknown. People with certain gene types are predisposed to developing fibromyalgia and other conditions. Certain factors can play a role as triggers, such as:  Spine disorders.  Arthritis.  Severe injury (trauma) and other physical stressors.  Emotional stressors. SYMPTOMS   The main symptom is pain and stiffness in the muscles and joints, which can vary over time.  Sleep and fatigue problems. Other related symptoms may include:  Bowel and bladder problems.  Headaches.  Visual problems.  Problems with odors and noises.  Depression or mood changes.  Painful periods (dysmenorrhea).  Dryness of the skin or eyes. DIAGNOSIS  There are no specific tests for diagnosing fibromyalgia. Patients can be diagnosed accurately from the specific symptoms they have. The diagnosis is made by determining that nothing else is causing the problems. TREATMENT  There is no cure. Management includes medicines and an active, healthy lifestyle. The goal is to enhance physical fitness, decrease pain, and improve sleep. HOME CARE INSTRUCTIONS   Only take over-the-counter or prescription medicines as directed by your caregiver. Sleeping pills, tranquilizers, and pain medicines may make your problems worse.  Low-impact aerobic exercise is very important and advised for treatment. At first, it may seem to make pain worse. Gradually increasing your tolerance will overcome this feeling.  Learning relaxation  techniques and how to control stress will help you. Biofeedback, visual imagery, hypnosis, muscle relaxation, yoga, and meditation are all options.  Anti-inflammatory medicines and physical therapy may provide short-term help.  Acupuncture or massage treatments may help.  Take muscle relaxant medicines as suggested by your caregiver.  Avoid stressful situations.  Plan a healthy lifestyle. This includes your diet, sleep, rest, exercise, and friends.  Find and practice a hobby you enjoy.  Join a fibromyalgia support group for interaction, ideas, and sharing advice. This may be helpful. SEEK MEDICAL CARE IF:  You are not having good results or improvement from your treatment. FOR MORE INFORMATION  National Fibromyalgia Association: www.fmaware.[place]: www.arthritis.org Document Released: 09/15/2005 Document Revised: 12/08/2011 Document Reviewed: 12/26/2009 [facility] Patient Information 2014 [place], [place].

## 2013-10-11 NOTE — ED Notes (Signed)
Pt states that she has fibromyalgia and tonight she started having back pain and hip pain. Took percocet, cymbalta and another medication with no relief. Hypertensive.

## 2013-10-11 NOTE — ED Provider Notes (Signed)
CSN: [id number]     Arrival date & time 10/11/13  2011 History   First MD Initiated Contact with Patient 10/11/13 2048     Chief Complaint  Patient presents with  . Back Pain  . Hip Pain   (Consider location/radiation/quality/duration/timing/severity/associated sxs/prior Treatment) HPI Jodi Bowman is a 53 y.o. female who presents to emergency department complaining of pain to the left hip and back. She states she was laying down this evening when her pain began. Patient states that her pain is similar to prior fibromyalgia exacerbations. She denies any injuries. She denies any fever, chills. She states she has had exact same pain in the past. She states she took a oxycodone, Zanaflex, and heard Cymbalta with no pain relief. Patient states that she follows with a fibromyalgia specialist at [facility]. Pt denies weakness or numbness in extremities. She denies chest pain, abdominal pain, urinary symptoms. No n/v/d. No other complaints. States she just need pain control   Past Medical History  Diagnosis Date  . Hypertension   . Hypercholesteremia   . Lower back pain   . Nontoxic uninodular goiter     sees dr vollmer at [facility]  . Calculus of gallbladder without mention of cholecystitis or obstruction   . Arthritis   . Stroke 2000  . Migraine   . Sleep apnea     STOPBANG=5   Past Surgical History  Procedure Laterality Date  . Knee arthroscopy  one 1995 and 1 in 1997    both knees done  . Abdominal hysterectomy    . Cesarean section  yrs ago    done x 2  . Surgery for endometriosis  yrs ago  . Thryoid biopsy  December 01, 2011     [facility]  . Cholecystectomy  01/05/2012    Procedure: LAPAROSCOPIC CHOLECYSTECTOMY WITH INTRAOPERATIVE CHOLANGIOGRAM;  Surgeon: Pedro Earls, MD;  Location: [facility];  Service: General;  Laterality: N/A;  . Colonoscopy  10/08/2012    Procedure: COLONOSCOPY;  Surgeon: Beryle Beams, MD;  Location: [facility];  Service: Endoscopy;  Laterality: N/A;    Family History  Problem Relation Age of Onset  . Cancer Brother     colon and lung  . Cancer Maternal Grandmother     colon   History  Substance Use Topics  . Smoking status: Never Smoker   . Smokeless tobacco: Never Used  . Alcohol Use: No   OB History   Grav Para Term Preterm Abortions TAB SAB Ect Mult Living                 Review of Systems  Constitutional: Negative for fever and chills.  Respiratory: Negative for cough, chest tightness and shortness of breath.   Cardiovascular: Negative for chest pain, palpitations and leg swelling.  Gastrointestinal: Negative for nausea, vomiting, abdominal pain and diarrhea.  Genitourinary: Negative for dysuria, flank pain, vaginal bleeding, vaginal discharge, vaginal pain and pelvic pain.  Musculoskeletal: Positive for arthralgias, back pain and myalgias. Negative for neck pain and neck stiffness.  Skin: Negative for rash.  Neurological: Negative for dizziness, weakness, numbness and headaches.  All other systems reviewed and are negative.    Allergies  Shrimp and Naproxen  Home Medications   Current Outpatient Rx  Name  Route  Sig  Dispense  Refill  . amLODipine (NORVASC) 5 MG tablet   Oral   Take 5 mg by mouth every morning.          Marland Kitchen aspirin EC  81 MG tablet   Oral   Take 81 mg by mouth daily.         . cholecalciferol (VITAMIN D) 1000 UNITS tablet   Oral   Take 5,000 Units by mouth daily.         . DULoxetine (CYMBALTA) 60 MG capsule   Oral   Take 60 mg by mouth daily.         Marland Kitchen lisinopril-hydrochlorothiazide (PRINZIDE,ZESTORETIC) 20-25 MG per tablet   Oral   Take 1 tablet by mouth every morning.          . loratadine (CLARITIN) 10 MG tablet   Oral   Take 10 mg by mouth every morning.          . metoprolol succinate (TOPROL-XL) 50 MG 24 hr tablet   Oral   Take 50 mg by mouth daily. Take with or immediately following a meal.         . oxyCODONE-acetaminophen (PERCOCET/ROXICET) 5-325 MG per  tablet   Oral   Take 1 tablet by mouth every 4 (four) hours as needed for severe pain.         . pravastatin (PRAVACHOL) 40 MG tablet   Oral   Take 40 mg by mouth at bedtime.          Marland Kitchen tiZANidine (ZANAFLEX) 4 MG tablet   Oral   Take 4 mg by mouth every 6 (six) hours as needed for muscle spasms.          BP 184/104  Pulse 98  Temp(Src) 98.4 F (36.9 C) (Oral)  Resp 18  SpO2 100% Physical Exam  Nursing note and vitals reviewed. Constitutional: She is oriented to person, place, and time. She appears well-developed and well-nourished. No distress.  HENT:  Head: Normocephalic.  Eyes: Conjunctivae are normal.  Neck: Neck supple.  Cardiovascular: Normal rate, regular rhythm and normal heart sounds.   Pulmonary/Chest: Effort normal and breath sounds normal. No respiratory distress. She has no wheezes. She has no rales.  Abdominal: Soft. Bowel sounds are normal. She exhibits no distension. There is no tenderness. There is no rebound.  Musculoskeletal: She exhibits no edema.  Midline and perivertebral tenderness around thoracic spine. Tenderness to the left hip, pain with hip ROM. No swelling, erythema, warmth to the touch. Dorsal pedal pulses intact. Full rom of the left hip passively.   Neurological: She is alert and oriented to person, place, and time. No cranial nerve deficit. Coordination normal.  5/5 and equal lower extremity strength. 2+ and equal patellar reflexes bilaterally. Pt able to dorsiflex bilateral toes and feet with good strength against resistance. Equal sensation bilaterally over thighs and lower legs.   Skin: Skin is warm and dry.  Psychiatric: She has a normal mood and affect. Her behavior is normal.    ED Course  Procedures (including critical care time) Labs Review Labs Reviewed - No data to display Imaging Review No results found.  EKG Interpretation   None       MDM   1. Back pain   2. Hip pain, left   3. Fibromyalgia     Patient with  back pain and left hip pain. No injuries. No signs of infection. States pain exactly the same as prior fibromyalgia exacerbation. Will treat her pain emergency department with Dilaudid 1 mg IM, Ativan 1 mg IM. Her blood pressure is elevated. Patient didn't take her blood pressure medications today. She states that her blood pressures always elevated when she is in  pain. She is instructed to followup closely with her primary care doctor to have that rechecked. Pt's pain improved with medications. She is ready for discharge home with her PCP or specialist follow up.   Filed Vitals:   10/11/13 2044 10/11/13 2306  BP: 184/104 180/93  Pulse: 98 100  Temp: 98.4 F (36.9 C)   TempSrc: Oral   Resp: 18 16  SpO2: 100% 99%      Renold Genta, PA-C 10/11/13 2330

## 2013-10-12 ENCOUNTER — Emergency Department (HOSPITAL_COMMUNITY): Payer: Medicaid Other

## 2013-10-12 ENCOUNTER — Encounter (HOSPITAL_COMMUNITY): Payer: Self-pay | Admitting: Emergency Medicine

## 2013-10-12 ENCOUNTER — Emergency Department (HOSPITAL_COMMUNITY)
Admission: EM | Admit: 2013-10-12 | Discharge: 2013-10-12 | Disposition: A | Discharge status: Home / Self Care | Payer: Medicaid Other | Attending: Emergency Medicine | Admitting: Emergency Medicine

## 2013-10-12 DIAGNOSIS — Z96659 Presence of unspecified artificial knee joint: Secondary | ICD-10-CM | POA: Insufficient documentation

## 2013-10-12 DIAGNOSIS — Z8739 Personal history of other diseases of the musculoskeletal system and connective tissue: Secondary | ICD-10-CM | POA: Insufficient documentation

## 2013-10-12 DIAGNOSIS — R209 Unspecified disturbances of skin sensation: Secondary | ICD-10-CM | POA: Insufficient documentation

## 2013-10-12 DIAGNOSIS — Z8719 Personal history of other diseases of the digestive system: Secondary | ICD-10-CM | POA: Insufficient documentation

## 2013-10-12 DIAGNOSIS — R5381 Other malaise: Secondary | ICD-10-CM | POA: Insufficient documentation

## 2013-10-12 DIAGNOSIS — Z8669 Personal history of other diseases of the nervous system and sense organs: Secondary | ICD-10-CM | POA: Insufficient documentation

## 2013-10-12 DIAGNOSIS — M129 Arthropathy, unspecified: Secondary | ICD-10-CM | POA: Insufficient documentation

## 2013-10-12 DIAGNOSIS — I10 Essential (primary) hypertension: Secondary | ICD-10-CM | POA: Insufficient documentation

## 2013-10-12 DIAGNOSIS — Z7982 Long term (current) use of aspirin: Secondary | ICD-10-CM | POA: Insufficient documentation

## 2013-10-12 DIAGNOSIS — R208 Other disturbances of skin sensation: Secondary | ICD-10-CM

## 2013-10-12 DIAGNOSIS — Z862 Personal history of diseases of the blood and blood-forming organs and certain disorders involving the immune mechanism: Secondary | ICD-10-CM | POA: Insufficient documentation

## 2013-10-12 DIAGNOSIS — Z79899 Other long term (current) drug therapy: Secondary | ICD-10-CM | POA: Insufficient documentation

## 2013-10-12 DIAGNOSIS — Z8639 Personal history of other endocrine, nutritional and metabolic disease: Secondary | ICD-10-CM | POA: Insufficient documentation

## 2013-10-12 DIAGNOSIS — R200 Anesthesia of skin: Secondary | ICD-10-CM

## 2013-10-12 DIAGNOSIS — E7800 Pure hypercholesterolemia, unspecified: Secondary | ICD-10-CM | POA: Insufficient documentation

## 2013-10-12 DIAGNOSIS — Z8673 Personal history of transient ischemic attack (TIA), and cerebral infarction without residual deficits: Secondary | ICD-10-CM | POA: Insufficient documentation

## 2013-10-12 DIAGNOSIS — R531 Weakness: Secondary | ICD-10-CM

## 2013-10-12 DIAGNOSIS — R5383 Other fatigue: Secondary | ICD-10-CM

## 2013-10-12 LAB — COMPREHENSIVE METABOLIC PANEL
ALT: 23 U/L (ref 0–35)
AST: 22 U/L (ref 0–37)
Albumin: 3.7 g/dL (ref 3.5–5.2)
Alkaline Phosphatase: 90 U/L (ref 39–117)
BUN: 9 mg/dL (ref 6–23)
CO2: 29 mEq/L (ref 19–32)
Calcium: 9.3 mg/dL (ref 8.4–10.5)
Chloride: 100 mEq/L (ref 96–112)
Creatinine, Ser: 0.92 mg/dL (ref 0.50–1.10)
GFR calc Af Amer: 80 mL/min — ABNORMAL LOW (ref >90)
GFR calc non Af Amer: 69 mL/min — ABNORMAL LOW (ref >90)
Glucose, Bld: 93 mg/dL (ref 70–99)
Potassium: 4.1 mEq/L (ref 3.7–5.3)
Sodium: 141 mEq/L (ref 137–147)
Total Bilirubin: 0.3 mg/dL (ref 0.3–1.2)
Total Protein: 7.7 g/dL (ref 6.0–8.3)

## 2013-10-12 LAB — DIFFERENTIAL
Basophils Absolute: 0 10*3/uL (ref 0.0–0.1)
Basophils Relative: 0 % (ref 0–1)
Eosinophils Absolute: 0.1 10*3/uL (ref 0.0–0.7)
Eosinophils Relative: 1 % (ref 0–5)
Lymphocytes Relative: 41 % (ref 12–46)
Lymphs Abs: 4.1 10*3/uL — ABNORMAL HIGH (ref 0.7–4.0)
Monocytes Absolute: 0.8 10*3/uL (ref 0.1–1.0)
Monocytes Relative: 8 % (ref 3–12)
Neutro Abs: 5 10*3/uL (ref 1.7–7.7)
Neutrophils Relative %: 50 % (ref 43–77)

## 2013-10-12 LAB — CBC
HCT: 43 % (ref 36.0–46.0)
Hemoglobin: 14.3 g/dL (ref 12.0–15.0)
MCH: 28.4 pg (ref 26.0–34.0)
MCHC: 33.3 g/dL (ref 30.0–36.0)
MCV: 85.3 fL (ref 78.0–100.0)
Platelets: 303 10*3/uL (ref 150–400)
RBC: 5.04 MIL/uL (ref 3.87–5.11)
RDW: 15.2 % (ref 11.5–15.5)
WBC: 9.9 10*3/uL (ref 4.0–10.5)

## 2013-10-12 LAB — POCT I-STAT, CHEM 8
BUN: 9 mg/dL (ref 6–23)
Calcium, Ion: 1.18 mmol/L (ref 1.12–1.23)
Chloride: 101 mEq/L (ref 96–112)
Creatinine, Ser: 1 mg/dL (ref 0.50–1.10)
Glucose, Bld: 86 mg/dL (ref 70–99)
HCT: 48 % — ABNORMAL HIGH (ref 36.0–46.0)
Hemoglobin: 16.3 g/dL — ABNORMAL HIGH (ref 12.0–15.0)
Potassium: 4.4 mEq/L (ref 3.7–5.3)
Sodium: 141 mEq/L (ref 137–147)
TCO2: 32 mmol/L (ref 0–100)

## 2013-10-12 LAB — RAPID URINE DRUG SCREEN, HOSP PERFORMED
Amphetamines: NOT DETECTED
Barbiturates: NOT DETECTED
Benzodiazepines: NOT DETECTED
Cocaine: NOT DETECTED
Opiates: NOT DETECTED
Tetrahydrocannabinol: NOT DETECTED

## 2013-10-12 LAB — PROTIME-INR
INR: 0.98 (ref 0.00–1.49)
Prothrombin Time: 12.8 seconds (ref 11.6–15.2)

## 2013-10-12 LAB — URINALYSIS, ROUTINE W REFLEX MICROSCOPIC
Bilirubin Urine: NEGATIVE
Glucose, UA: NEGATIVE mg/dL
Hgb urine dipstick: NEGATIVE
Ketones, ur: NEGATIVE mg/dL
Leukocytes, UA: NEGATIVE
Nitrite: NEGATIVE
Protein, ur: NEGATIVE mg/dL
Specific Gravity, Urine: 1.012 (ref 1.005–1.030)
Urobilinogen, UA: 0.2 mg/dL (ref 0.0–1.0)
pH: 6 (ref 5.0–8.0)

## 2013-10-12 LAB — POCT I-STAT TROPONIN I: Troponin i, poc: 0.02 ng/mL (ref 0.00–0.08)

## 2013-10-12 LAB — ETHANOL: Alcohol, Ethyl (B): <11 mg/dL (ref 0–11)

## 2013-10-12 LAB — TROPONIN I: Troponin I: <0.3 ng/mL (ref <0.30)

## 2013-10-12 LAB — GLUCOSE, CAPILLARY: Glucose-Capillary: 93 mg/dL (ref 70–99)

## 2013-10-12 LAB — APTT: aPTT: 32 seconds (ref 24–37)

## 2013-10-12 MED ORDER — DEXAMETHASONE SODIUM PHOSPHATE 10 MG/ML IJ SOLN
10.0000 mg | Freq: Once | INTRAMUSCULAR | Status: AC
  Administered 2013-10-12: 10 mg via INTRAMUSCULAR
  Filled 2013-10-12: qty 1

## 2013-10-12 MED ORDER — PROCHLORPERAZINE EDISYLATE 5 MG/ML IJ SOLN
10.0000 mg | Freq: Once | INTRAMUSCULAR | Status: AC
  Administered 2013-10-12: 10 mg via INTRAMUSCULAR
  Filled 2013-10-12: qty 2

## 2013-10-12 NOTE — ED Notes (Signed)
Ambulatory to d/c window with steady gait A & O

## 2013-10-12 NOTE — ED Notes (Signed)
Pt c/o left sided facial numbness since 8am; c/o right sided hip down to knee numbness; states for past two wks has been stumbling; minimal deficit noted to left side of face with open/closing eyes and blowing out cheeks; states arms and legs don't feel as strong

## 2013-10-12 NOTE — ED Provider Notes (Signed)
CSN: [id number]     Arrival date & time 10/12/13  1456 History   First MD Initiated Contact with Patient 10/12/13 1514     Chief Complaint  Patient presents with  . Numbness   (Consider location/radiation/quality/duration/timing/severity/associated sxs/prior Treatment) HPI Jodi Bowman is a morbidly obese female with a past medical history of stroke, migraine, hypertension, hypercholesterolemia, fibromyalgia who presents the emergency department with multiple neurologic complaints.  Patient states that this morning she awoke with left-sided facial numbness.  Been ongoing since 8 AM this morning.  She denies any visual changes.  She does endorse some muffled hearing on the left side.  She states she's had this symptom before with migraines however she does not have a headache today and she states that it is more intense.  The patient also complains of numbness in the right anterior thigh.  She states that she feels weak all over.  She says her husband has told her that her balance is off and she has been stumbling around the house for the past 2 weeks.  The patient denies any recent flu vaccination.  She endorses left-sided facial weakness however she denies difficulty with speech,or vertigo.    Past Medical History  Diagnosis Date  . Hypertension   . Hypercholesteremia   . Lower back pain   . Nontoxic uninodular goiter     sees dr vollmer at [facility]  . Calculus of gallbladder without mention of cholecystitis or obstruction   . Arthritis   . Stroke 2000  . Migraine   . Sleep apnea     STOPBANG=5   Past Surgical History  Procedure Laterality Date  . Knee arthroscopy  one 1995 and 1 in 1997    both knees done  . Abdominal hysterectomy    . Cesarean section  yrs ago    done x 2  . Surgery for endometriosis  yrs ago  . Thryoid biopsy  December 01, 2011     at [facility]  . Cholecystectomy  01/05/2012    Procedure: LAPAROSCOPIC CHOLECYSTECTOMY WITH INTRAOPERATIVE CHOLANGIOGRAM;  Surgeon:  Pedro Earls, MD;  Location: [facility];  Service: General;  Laterality: N/A;  . Colonoscopy  10/08/2012    Procedure: COLONOSCOPY;  Surgeon: Beryle Beams, MD;  Location: [facility];  Service: Endoscopy;  Laterality: N/A;   Family History  Problem Relation Age of Onset  . Cancer Brother     colon and lung  . Cancer Maternal Grandmother     colon   History  Substance Use Topics  . Smoking status: Never Smoker   . Smokeless tobacco: Never Used  . Alcohol Use: No   OB History   Grav Para Term Preterm Abortions TAB SAB Ect Mult Living                 Review of Systems  Ten systems reviewed and are negative for acute change, except as noted in the HPI.   Allergies  Shrimp and Naproxen  Home Medications   Current Outpatient Rx  Name  Route  Sig  Dispense  Refill  . amLODipine (NORVASC) 5 MG tablet   Oral   Take 5 mg by mouth every morning.          Marland Kitchen aspirin EC 81 MG tablet   Oral   Take 81 mg by mouth daily.         . cholecalciferol (VITAMIN D) 1000 UNITS tablet   Oral   Take 5,000 Units by mouth daily.         Marland Kitchen  DULoxetine (CYMBALTA) 60 MG capsule   Oral   Take 60 mg by mouth daily.         Marland Kitchen lisinopril-hydrochlorothiazide (PRINZIDE,ZESTORETIC) 20-25 MG per tablet   Oral   Take 1 tablet by mouth every morning.          . loratadine (CLARITIN) 10 MG tablet   Oral   Take 10 mg by mouth every morning.          . metoprolol succinate (TOPROL-XL) 50 MG 24 hr tablet   Oral   Take 50 mg by mouth daily. Take with or immediately following a meal.         . oxyCODONE-acetaminophen (PERCOCET/ROXICET) 5-325 MG per tablet   Oral   Take 1 tablet by mouth every 4 (four) hours as needed for severe pain.         . pravastatin (PRAVACHOL) 40 MG tablet   Oral   Take 40 mg by mouth at bedtime.          Marland Kitchen tiZANidine (ZANAFLEX) 4 MG tablet   Oral   Take 4 mg by mouth every 6 (six) hours as needed for muscle spasms.          BP 159/99  Pulse 90   Temp(Src) 97.8 F (36.6 C)  Resp 20  SpO2 100% Physical Exam  Nursing note and vitals reviewed. Constitutional: No distress.  Morbidly obese female in NAD  HENT:  Head: Normocephalic.  Mouth/Throat: Uvula is midline and oropharynx is clear and moist.  Symmetric rise of the uvula TMs normal bilaterally, no vesicles on the ears or in the ear canals. No hutchison's sign on the nose.   Eyes: Pupils are equal, round, and reactive to light.  Fundoscopic exam:      The right eye shows no arteriolar narrowing, no AV nicking, no exudate, no hemorrhage and no papilledema.       The left eye shows no arteriolar narrowing, no AV nicking, no exudate, no hemorrhage and no papilledema.  Subtle left ptosis and minimal left eye esotropia.  Neurological: She is alert. A cranial nerve deficit and sensory deficit is present. GCS eye subscore is 4. GCS verbal subscore is 5. GCS motor subscore is 6.  Patient with a subtle left-sided facial weakness.  She has mild ptosis of the left eye.  Slight medial angulation of the left eye.  Vision is grossly intact and visual fields are full to confrontation.  No nystagmus.  Patient is able to raise the eyebrows bilaterally.  She has equal sensation to sharp and dull bilaterally on the facial nerves.  However she reports subjective numbness.  Patient have overall weakness of the extremities.  Negative Romberg.  Gait is antalgic.  Patient has slight disequilibrium and balance is poor.  Reflexes are difficult to elicit.     ED Course  Procedures (including critical care time) Labs Review Labs Reviewed - No data to display Imaging Review No results found.  EKG Interpretation   None       MDM   1. Left facial numbness   2. Weakness   3. Numbness of right anterior thigh    Patient here with neurologic symptoms.  Possibility for complex migraine.  However patient may have new onset multiple sclerosis symptoms.  I do not feel that this is likely stroke however  able initiated stroke workup.  The patient hasn't ABCD to score of 5 putting her at moderate risk for stroke.  The patient is seen and shared  visit with Dr. Jeneen Rinks.  Patient reports teeth numbness to Dr. Jeneen Rinks. Takes a daily asa. Stroke in 2000 without residual deficits. I have spoken with Dr. Alexis Goodell (neurology) who recommends MRI.   Patient MRI returned. I reviewed results with Dr. Armida Sans (neurology) who feels the appearance is non emergent, not likely MS. Possible complex migraine vs. Small vessel disease.Will treat for migraine with decadron and compazine. Follow up with Neurology.  Margarita Mail, PA-C 10/13/13 1034

## 2013-10-12 NOTE — Discharge Instructions (Signed)
Migraine Headache A migraine headache is an intense, throbbing pain on one or both sides of your head. A migraine can last for 30 minutes to several hours. CAUSES  The exact cause of a migraine headache is not always known. However, a migraine may be caused when nerves in the brain become irritated and release chemicals that cause inflammation. This causes pain. Certain things may also trigger migraines, such as:  Alcohol.  Smoking.  Stress.  Menstruation.  Aged cheeses.  Foods or drinks that contain nitrates, glutamate, aspartame, or tyramine.  Lack of sleep.  Chocolate.  Caffeine.  Hunger.  Physical exertion.  Fatigue.  Medicines used to treat chest pain (nitroglycerine), birth control pills, estrogen, and some blood pressure medicines. SIGNS AND SYMPTOMS  Pain on one or both sides of your head.  Pulsating or throbbing pain.  Severe pain that prevents daily activities.  Pain that is aggravated by any physical activity.  Nausea, vomiting, or both.  Dizziness.  Pain with exposure to bright lights, loud noises, or activity.  General sensitivity to bright lights, loud noises, or smells. Before you get a migraine, you may get warning signs that a migraine is coming (aura). An aura may include:  Seeing flashing lights.  Seeing bright spots, halos, or zig-zag lines.  Having tunnel vision or blurred vision.  Having feelings of numbness or tingling.  Having trouble talking.  Having muscle weakness. DIAGNOSIS  A migraine headache is often diagnosed based on:  Symptoms.  Physical exam.  A CT scan or MRI of your head. These imaging tests cannot diagnose migraines, but they can help rule out other causes of headaches. TREATMENT Medicines may be given for pain and nausea. Medicines can also be given to help prevent recurrent migraines.  HOME CARE INSTRUCTIONS  Only take over-the-counter or prescription medicines for pain or discomfort as directed by your  health care provider. The use of long-term narcotics is not recommended.  Lie down in a dark, quiet room when you have a migraine.  Keep a journal to find out what may trigger your migraine headaches. For example, write down:  What you eat and drink.  How much sleep you get.  Any change to your diet or medicines.  Limit alcohol consumption.  Quit smoking if you smoke.  Get 7 9 hours of sleep, or as recommended by your health care provider.  Limit stress.  Keep lights dim if bright lights bother you and make your migraines worse. SEEK IMMEDIATE MEDICAL CARE IF:   Your migraine becomes severe.  You have a fever.  You have a stiff neck.  You have vision loss.  You have muscular weakness or loss of muscle control.  You start losing your balance or have trouble walking.  You feel faint or pass out.  You have severe symptoms that are different from your first symptoms. MAKE SURE YOU:   Understand these instructions.  Will watch your condition.  Will get help right away if you are not doing well or get worse. Document Released: 09/15/2005 Document Revised: 07/06/2013 Document Reviewed: 05/23/2013 [employer] Patient Information 2014 [employer].  

## 2013-10-12 NOTE — ED Provider Notes (Signed)
Medical screening examination/treatment/procedure(s) were conducted as a shared visit with non-physician practitioner(s) and myself.  I personally evaluated the patient during the encounter.  EKG Interpretation   None       Pt c/o mid to lower back pain c/w her prior pain, and fibromyalgia. No fever/chills. No trauma or fall. No gu c/o. No numbness/weakness. Spine nt.   Mirna Mires, MD 10/12/13 [phone]

## 2013-10-12 NOTE — ED Notes (Signed)
Pt. Saline lock removed and 2x2 and tape applied.

## 2013-10-15 NOTE — ED Provider Notes (Signed)
Medical screening examination/treatment/procedure(s) were performed by non-physician practitioner and as supervising physician I was immediately available for consultation/collaboration.  EKG Interpretation    Date/Time:  Wednesday October 12 2013 16:42:26 EST Ventricular Rate:  80 PR Interval:  148 QRS Duration: 75 QT Interval:  313 QTC Calculation: 361 R Axis:     Text Interpretation:  Sinus rhythm No ischemic changes. Confirmed by Jeneen Rinks  MD, [place] ([id number]) on 10/12/2013 11:34:04 PM              Tanna Furry, MD 10/15/13 [phone]

## 2013-10-19 IMAGING — US US SOFT TISSUE HEAD/NECK
1 series · 14 of 25 positions shown · non-contrast
Comparison: 11/13/2011.

CLINICAL DATA: Thyroid nodule.

THYROID ULTRASOUND
TECHNIQUE: Ultrasound examination of the thyroid gland and adjacent
soft tissues was performed.

[Series 1: us soft tissue head/neck · 0.08mm/px · 14 of 50 slices shown]
[im 1/50]
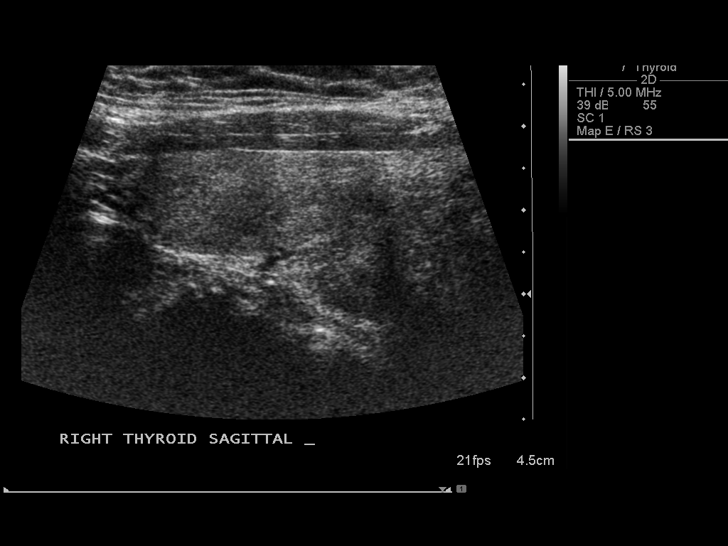
[im 5/50]
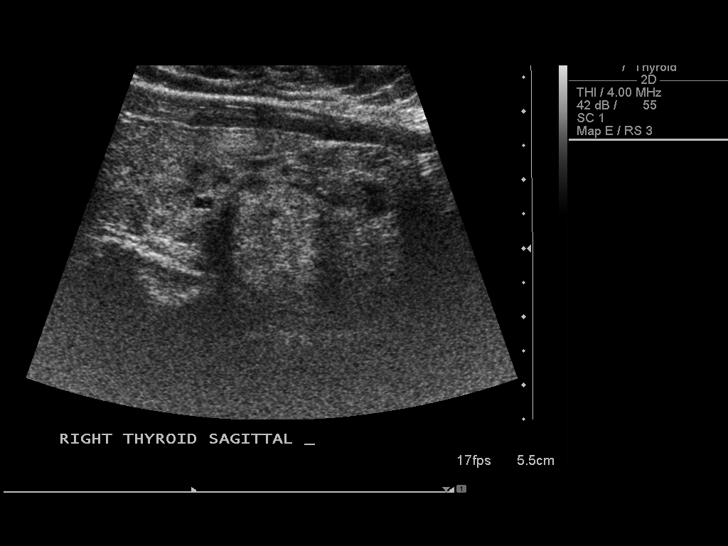
[im 9/50]
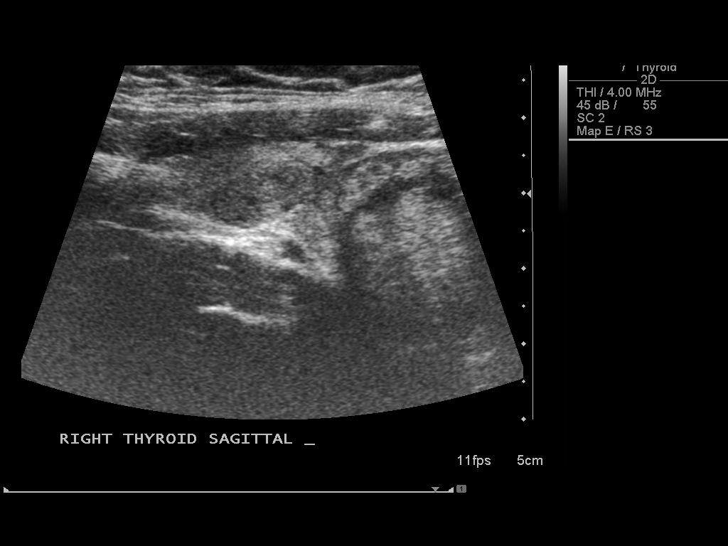
[im 13/50]
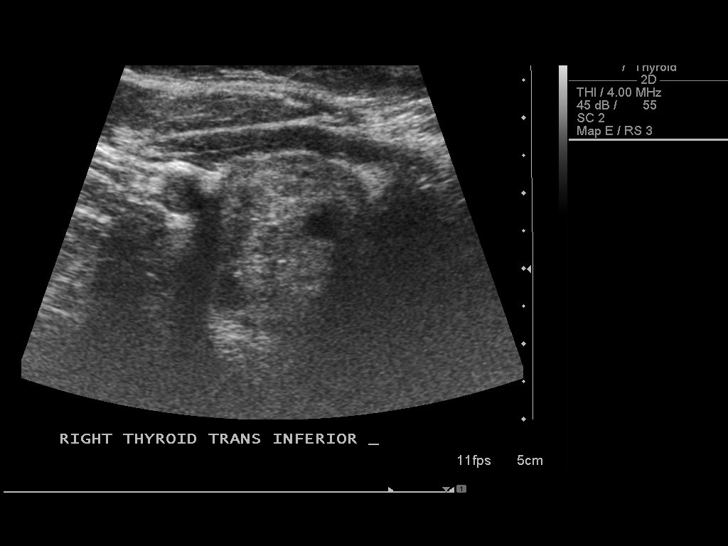
[im 17/50]
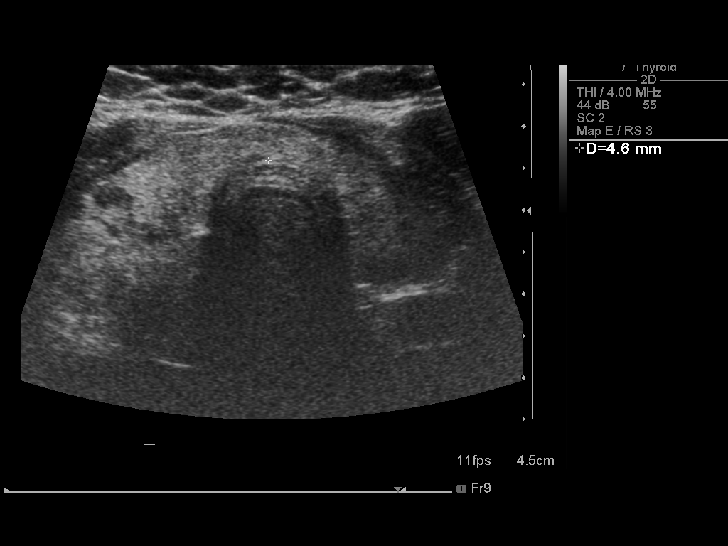
[im 19/50]
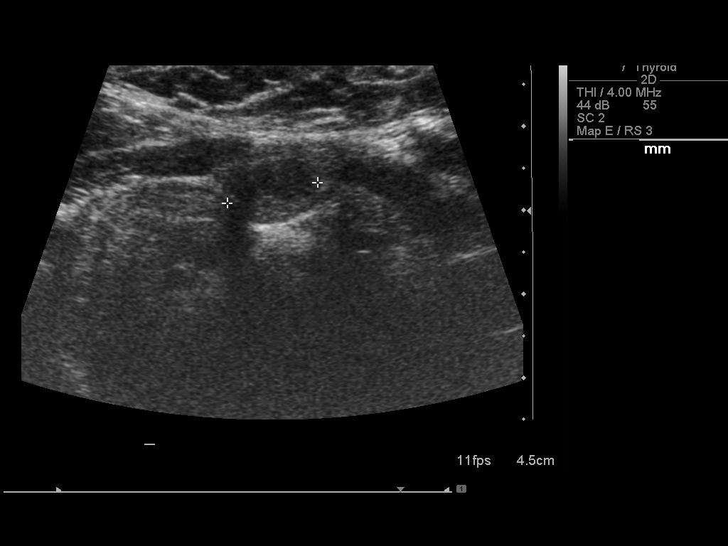
[im 23/50]
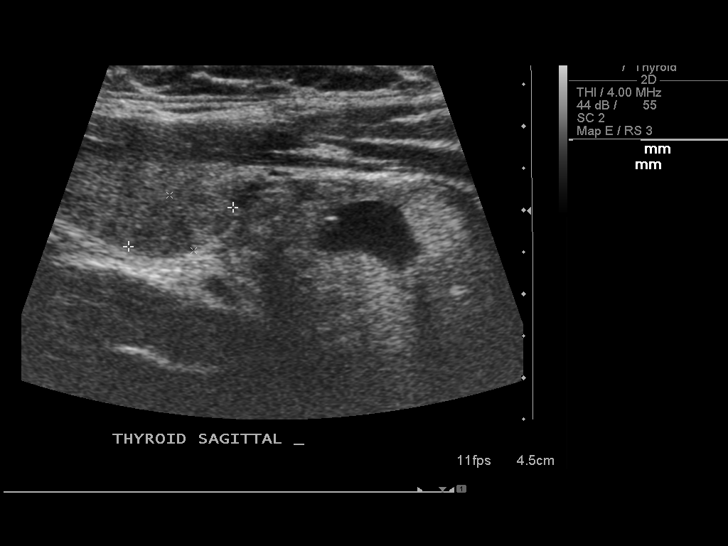
[im 27/50]
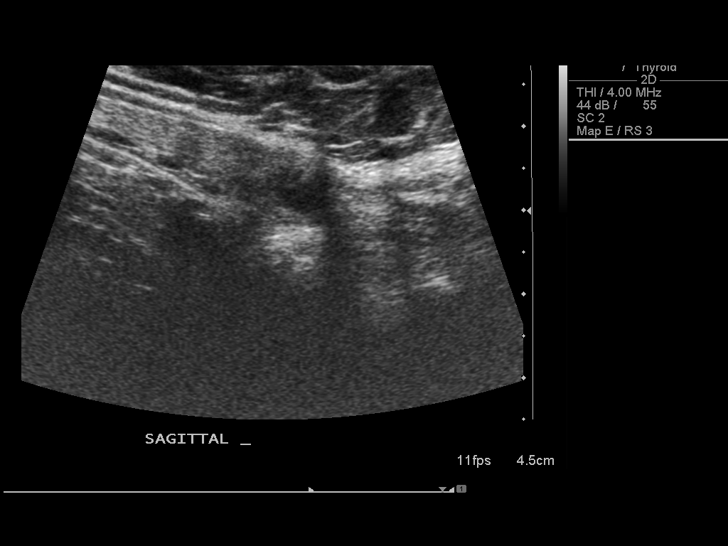
[im 31/50]
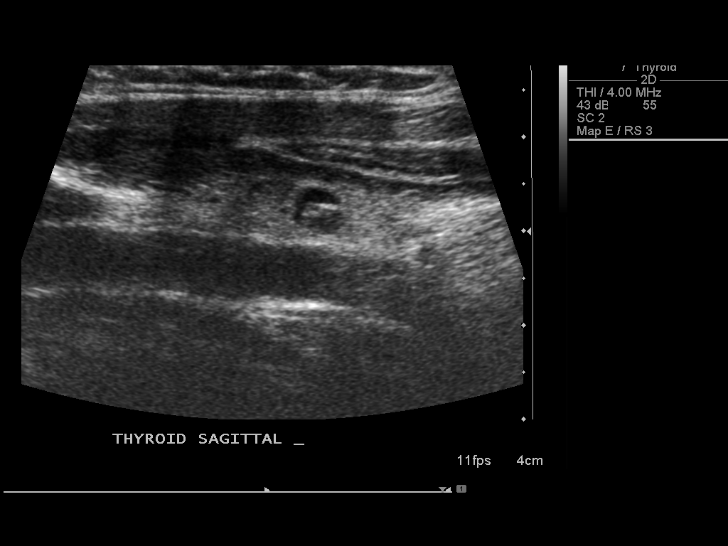
[im 33/50]
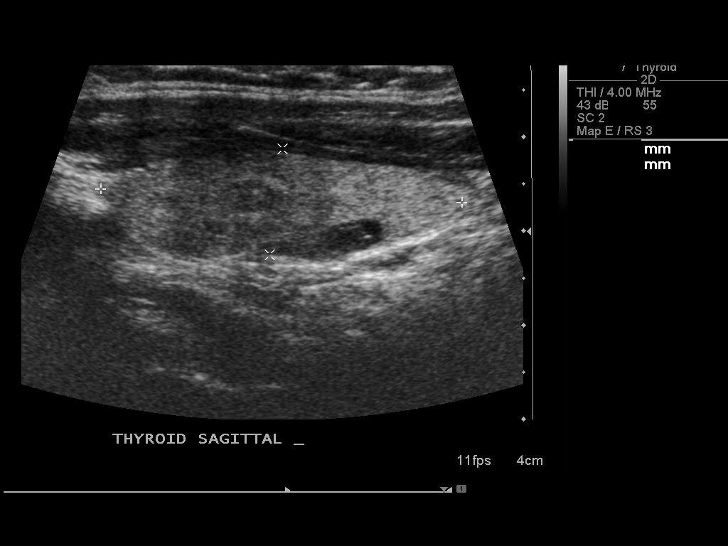
[im 37/50]
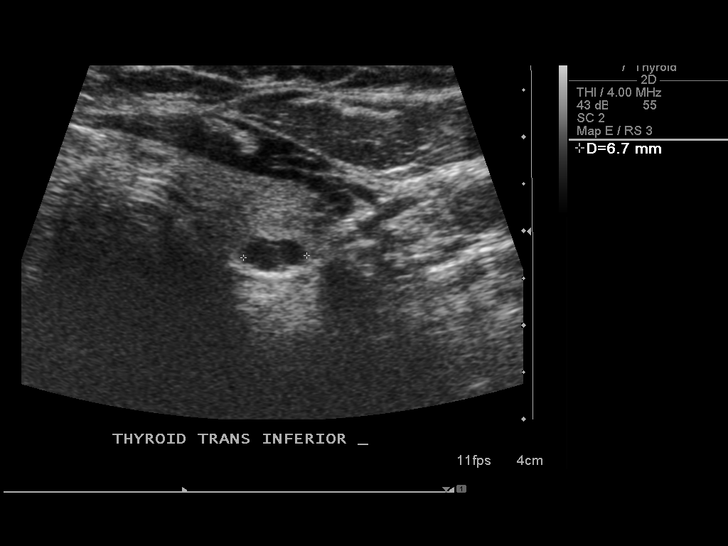
[im 41/50]
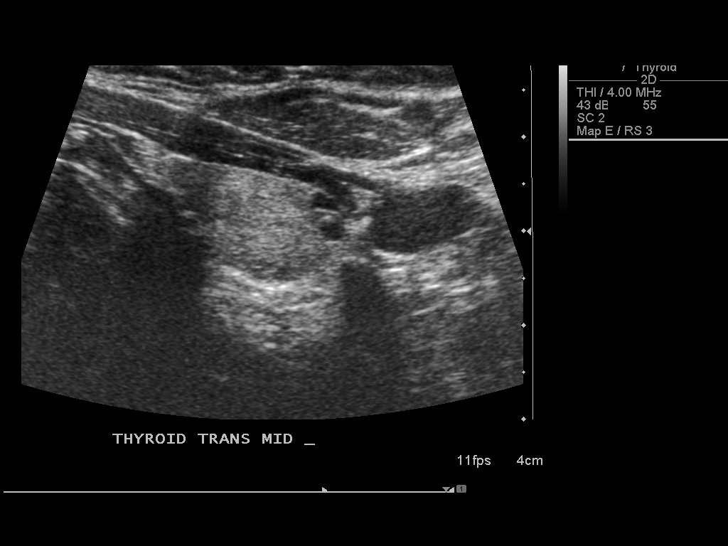
[im 45/50]
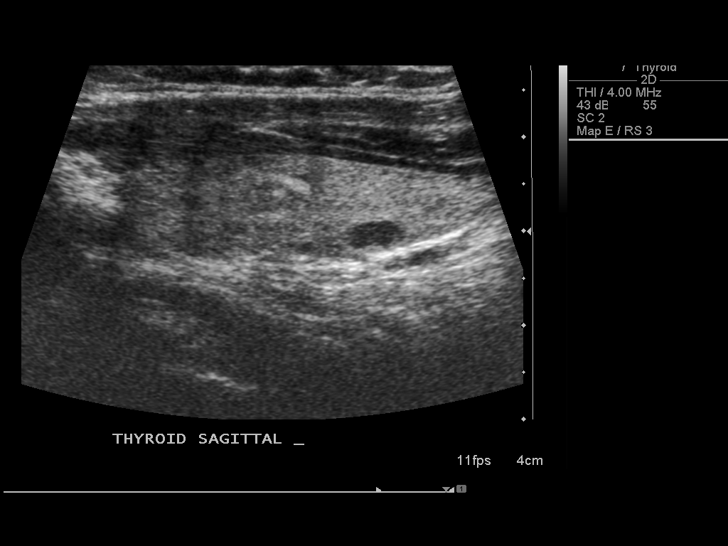
[im 50/50]
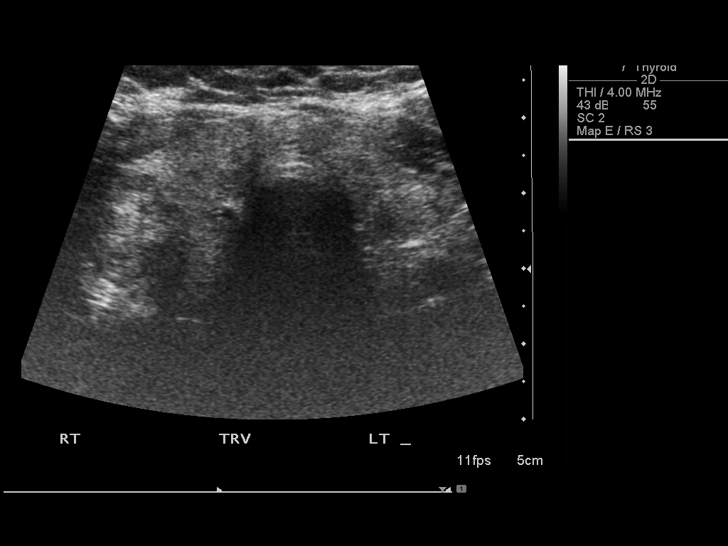

[14 of 25 positions shown; findings below may reference images not displayed]

FINDINGS: Right thyroid lobe:  Measures 6.0 x 2.9 x 2.5 cm (previously 5.2 x
1.9 x 2.1 cm), heterogeneous in echotexture.
Left thyroid lobe:  Measures 3.8 x 1.1 x 1.2 cm (previously 4.6 x
1.3 x 1.6 cm), homogeneous in echotexture.
Isthmus:  Measures 5 mm.

Focal nodules:

Solid nodule with small cystic component, right lower pole, 3.2 x
2.8 x 2.5 cm on image 7 (previously 2.9 x 2.8 x 2.4 cm), stable.

Solid nodule, right upper pole, 1.3 x 0.7 x 1.3 cm on image 24
(previously 1.6 x 0.8 x 1.1 cm), stable.

Additional hypoechoic nodules measure 1.1 cm or less in size in the
isthmus and left thyroid.

Lymphadenopathy:  None visualized.
IMPRESSION: Stable bilateral thyroid nodules.  Thyroid biopsy performed
12/01/2011.

## 2013-10-27 ENCOUNTER — Other Ambulatory Visit (HOSPITAL_COMMUNITY): Payer: Self-pay | Admitting: Cardiology

## 2013-10-27 DIAGNOSIS — R079 Chest pain, unspecified: Secondary | ICD-10-CM

## 2013-11-02 ENCOUNTER — Ambulatory Visit (HOSPITAL_COMMUNITY)
Admission: RE | Admit: 2013-11-02 | Discharge: 2013-11-02 | Disposition: A | Discharge status: Home / Self Care | Payer: Medicaid Other | Source: Ambulatory Visit | Attending: Cardiology | Admitting: Cardiology

## 2013-11-02 DIAGNOSIS — R079 Chest pain, unspecified: Secondary | ICD-10-CM

## 2013-11-02 MED ORDER — TECHNETIUM TC 99M SESTAMIBI GENERIC - CARDIOLITE
30.0000 | Freq: Once | INTRAVENOUS | Status: AC | PRN
  Administered 2013-11-02: 30 via INTRAVENOUS

## 2013-11-02 MED ORDER — REGADENOSON 0.4 MG/5ML IV SOLN
INTRAVENOUS | Status: AC
  Administered 2013-11-02: 0.4 mg via INTRAVENOUS
  Filled 2013-11-02: qty 5

## 2013-11-02 MED ORDER — REGADENOSON 0.4 MG/5ML IV SOLN
0.4000 mg | Freq: Once | INTRAVENOUS | Status: AC
  Administered 2013-11-02: 0.4 mg via INTRAVENOUS

## 2013-11-03 ENCOUNTER — Encounter (HOSPITAL_COMMUNITY)
Admission: RE | Admit: 2013-11-03 | Discharge: 2013-11-03 | Disposition: A | Discharge status: Home / Self Care | Payer: Medicaid Other | Source: Ambulatory Visit | Attending: Cardiology | Admitting: Cardiology

## 2013-11-03 DIAGNOSIS — R079 Chest pain, unspecified: Secondary | ICD-10-CM | POA: Insufficient documentation

## 2013-11-03 MED ORDER — TECHNETIUM TC 99M SESTAMIBI GENERIC - CARDIOLITE
30.0000 | Freq: Once | INTRAVENOUS | Status: AC | PRN
  Administered 2013-11-03: 30 via INTRAVENOUS

## 2013-11-03 MED ORDER — TECHNETIUM TC 99M SESTAMIBI GENERIC - CARDIOLITE
30.0000 | Freq: Once | INTRAVENOUS | Status: AC | PRN
  Administered 2013-11-02: 30 via INTRAVENOUS

## 2013-11-12 ENCOUNTER — Emergency Department (HOSPITAL_COMMUNITY)
Admission: EM | Admit: 2013-11-12 | Discharge: 2013-11-12 | Disposition: A | Discharge status: Home / Self Care | Payer: Medicaid Other | Attending: Emergency Medicine | Admitting: Emergency Medicine

## 2013-11-12 ENCOUNTER — Encounter (HOSPITAL_COMMUNITY): Payer: Self-pay | Admitting: Emergency Medicine

## 2013-11-12 DIAGNOSIS — M129 Arthropathy, unspecified: Secondary | ICD-10-CM | POA: Insufficient documentation

## 2013-11-12 DIAGNOSIS — Z79899 Other long term (current) drug therapy: Secondary | ICD-10-CM | POA: Insufficient documentation

## 2013-11-12 DIAGNOSIS — Z8719 Personal history of other diseases of the digestive system: Secondary | ICD-10-CM | POA: Insufficient documentation

## 2013-11-12 DIAGNOSIS — G43909 Migraine, unspecified, not intractable, without status migrainosus: Secondary | ICD-10-CM | POA: Insufficient documentation

## 2013-11-12 DIAGNOSIS — M546 Pain in thoracic spine: Secondary | ICD-10-CM | POA: Insufficient documentation

## 2013-11-12 DIAGNOSIS — I10 Essential (primary) hypertension: Secondary | ICD-10-CM | POA: Insufficient documentation

## 2013-11-12 DIAGNOSIS — E7800 Pure hypercholesterolemia, unspecified: Secondary | ICD-10-CM | POA: Insufficient documentation

## 2013-11-12 DIAGNOSIS — Z7982 Long term (current) use of aspirin: Secondary | ICD-10-CM | POA: Insufficient documentation

## 2013-11-12 DIAGNOSIS — R109 Unspecified abdominal pain: Secondary | ICD-10-CM | POA: Insufficient documentation

## 2013-11-12 DIAGNOSIS — M549 Dorsalgia, unspecified: Secondary | ICD-10-CM

## 2013-11-12 DIAGNOSIS — G8929 Other chronic pain: Secondary | ICD-10-CM | POA: Insufficient documentation

## 2013-11-12 DIAGNOSIS — Z8673 Personal history of transient ischemic attack (TIA), and cerebral infarction without residual deficits: Secondary | ICD-10-CM | POA: Insufficient documentation

## 2013-11-12 MED ORDER — HYDROMORPHONE HCL PF 1 MG/ML IJ SOLN
1.0000 mg | Freq: Once | INTRAMUSCULAR | Status: AC
  Administered 2013-11-12: 1 mg via INTRAMUSCULAR
  Filled 2013-11-12: qty 1

## 2013-11-12 MED ORDER — LORAZEPAM 2 MG/ML IJ SOLN
1.0000 mg | Freq: Once | INTRAMUSCULAR | Status: AC
  Administered 2013-11-12: 1 mg via INTRAMUSCULAR
  Filled 2013-11-12: qty 1

## 2013-11-12 NOTE — ED Notes (Signed)
Pt c/o low back pain radiating around to abd, pt states pain similar to spasms in the past.

## 2013-11-12 NOTE — Discharge Instructions (Signed)
Heat Therapy Heat therapy can help ease achy, tense, stiff, and tight muscles and joints. Heat should not be used on new injuries. Wait at least 48 hours after the injury before using heat therapy. Heat also should not be used for discomfort or pain that occurs right after doing an activity. If you still have pain or stiffness 3 hours after finishing the activity, then heat therapy may be used. PRECAUTIONS  High heat or prolonged exposure to heat can cause burns. Be careful when using heat therapy to avoid burning your skin. If you have any of the following conditions, do not use heat until you have discussed heat therapy with your caregiver:  Poor circulation.  Healing wounds or scarred skin in the area being treated.  Diabetes, heart disease, or high blood pressure.  Numbness of the area being treated.  Unusual swelling of the area being treated.  Active infections.  Blood clots.  Cancer.  Inability to communicate your response to pain. This can include young children and people with dementia. HOME CARE INSTRUCTIONS Moist heat pack  Soak a clean towel in warm water, and squeeze out the extra water. The water temperature should be comfortable to the skin.  Put the warm, wet towel in a plastic bag.  Place a thin, dry towel between your skin and the bag.  Put the heat pack on the area for 5 minutes, and check your skin. Your skin may be pink, but it should not be red.  Leave the heat pack on the area for a total of 15 to 30 minutes.  Repeat this every 2 to 4 hours while awake. Do not use heat while you are sleeping. Warm water bath  Fill a tub with warm water. The water temperature should be comfortable to the skin.  Place the affected body part in the tub.  Soak the area for 20 to 40 minutes.  Repeat as needed. Hot water bottle  Fill the water bottle half full with hot water.  Press out the extra air. Close the cap tightly.  Place a dry towel between your skin and  the bottle.  Put the bottle on the area for 5 minutes, and check your skin. Your skin may be pink, but it should not be red.  Leave the bottle on the area for a total of 15 to 30 minutes.  Repeat this every 2 to 4 hours while awake. Electric heating pad  Place a dry towel between your skin and the heating pad.  Set the heating pad on low heat.  Put the heating pad on the area for 10 minutes, and check your skin. Your skin may be pink, but it should not be red.  Leave the heating pad on the area for a total of 20 to 40 minutes.  Repeat this every 2 to 4 hours while awake.  Do not lie on the heating pad.  Do not fall asleep while using the heating pad.  Do not use the heating pad near water. Contact with water can result in an electrical shock. SEEK MEDICAL CARE IF:  You have blisters, redness, swelling, or numbness.  You have any new problems.  Your problems are getting worse.  You have any questions or concerns. If you develop any problems, stop using heat therapy until you see your caregiver. MAKE SURE YOU:  Understand these instructions.  Will watch your condition.  Will get help right away if you are not doing well or get worse. Document Released: 12/08/2011  Document Reviewed: 12/08/2011 [facility] Patient Information 2014 [place].

## 2013-11-12 NOTE — ED Provider Notes (Signed)
Medical screening examination/treatment/procedure(s) were performed by non-physician practitioner and as supervising physician I was immediately available for consultation/collaboration.    Teressa Lower, MD 11/12/13 [phone]

## 2013-11-12 NOTE — ED Provider Notes (Signed)
CSN: [id number]     Arrival date & time 11/12/13  0535 History   First MD Initiated Contact with Patient 11/12/13 [phone]     Chief Complaint  Patient presents with  . Back Pain     (Consider location/radiation/quality/duration/timing/severity/associated sxs/prior Treatment) Patient is a 53 y.o. female presenting with back pain. The history is provided by the patient. No language interpreter was used.  Back Pain Location:  Thoracic spine Quality:  Stabbing Radiates to: The pain radiates to the upper abdomen. Associated symptoms: abdominal pain   Associated symptoms: no chest pain and no fever   Associated symptoms comment:  Recurrent symptoms the patient associates with back "spasms". Also has a history of fibromyalgia. She states she take Zanaflex and oxycodone at home and these are not providing any relief. No cough, SOB, fever, nausea or vomiting.    Past Medical History  Diagnosis Date  . Hypertension   . Hypercholesteremia   . Lower back pain   . Nontoxic uninodular goiter     sees dr vollmer at [facility]  . Calculus of gallbladder without mention of cholecystitis or obstruction   . Arthritis   . Stroke 2000  . Migraine   . Sleep apnea     STOPBANG=5   Past Surgical History  Procedure Laterality Date  . Knee arthroscopy  one 1995 and 1 in 1997    both knees done  . Abdominal hysterectomy    . Cesarean section  yrs ago    done x 2  . Surgery for endometriosis  yrs ago  . Thryoid biopsy  December 01, 2011     [facility]  . Cholecystectomy  01/05/2012    Procedure: LAPAROSCOPIC CHOLECYSTECTOMY WITH INTRAOPERATIVE CHOLANGIOGRAM;  Surgeon: Pedro Earls, MD;  Location: [facility];  Service: General;  Laterality: N/A;  . Colonoscopy  10/08/2012    Procedure: COLONOSCOPY;  Surgeon: Beryle Beams, MD;  Location: [facility];  Service: Endoscopy;  Laterality: N/A;   Family History  Problem Relation Age of Onset  . Cancer Brother     colon and lung  . Cancer Maternal Grandmother      colon   History  Substance Use Topics  . Smoking status: Never Smoker   . Smokeless tobacco: Never Used  . Alcohol Use: No   OB History   Grav Para Term Preterm Abortions TAB SAB Ect Mult Living                 Review of Systems  Constitutional: Negative for fever and chills.  Respiratory: Negative.  Negative for cough and shortness of breath.   Cardiovascular: Negative.  Negative for chest pain.  Gastrointestinal: Positive for abdominal pain. Negative for nausea and vomiting.       See HPI.  Genitourinary: Negative.   Musculoskeletal: Positive for back pain.       See HPI.  Skin: Negative.   Neurological: Negative.       Allergies  Shrimp and Naproxen  Home Medications   Current Outpatient Rx  Name  Route  Sig  Dispense  Refill  . amLODipine (NORVASC) 5 MG tablet   Oral   Take 5 mg by mouth every morning.          Marland Kitchen aspirin EC 81 MG tablet   Oral   Take 81 mg by mouth daily.         . DULoxetine (CYMBALTA) 60 MG capsule   Oral   Take 60 mg by mouth daily.         Marland Kitchen  lisinopril-hydrochlorothiazide (PRINZIDE,ZESTORETIC) 20-25 MG per tablet   Oral   Take 1 tablet by mouth every morning.          . loratadine (CLARITIN) 10 MG tablet   Oral   Take 10 mg by mouth every morning.          . metoprolol succinate (TOPROL-XL) 50 MG 24 hr tablet   Oral   Take 50 mg by mouth daily. Take with or immediately following a meal.         . oxycodone (OXY-IR) 5 MG capsule   Oral   Take 5 mg by mouth 2 (two) times daily as needed.         . pravastatin (PRAVACHOL) 40 MG tablet   Oral   Take 40 mg by mouth at bedtime.          Marland Kitchen tiZANidine (ZANAFLEX) 4 MG tablet   Oral   Take 4 mg by mouth every 6 (six) hours as needed for muscle spasms.         Marland Kitchen topiramate (TOPAMAX) 25 MG tablet   Oral   Take 25 mg by mouth daily.         . Vitamin D, Ergocalciferol, (DRISDOL) 50000 UNITS CAPS capsule   Oral   Take 50,000 Units by mouth every 7 (seven)  days.          BP 157/75  Pulse 87  Temp(Src) 97.9 F (36.6 C) (Oral)  Resp 16  SpO2 100% Physical Exam  Constitutional: She is oriented to person, place, and time. She appears well-developed and well-nourished.  HENT:  Head: Normocephalic.  Neck: Normal range of motion. Neck supple.  Cardiovascular: Normal rate and regular rhythm.   Pulmonary/Chest: Effort normal and breath sounds normal. She has no wheezes. She has no rales. She exhibits no tenderness.  Abdominal: Soft. Bowel sounds are normal. There is no tenderness. There is no rebound and no guarding.  Musculoskeletal: Normal range of motion.  Tenderness to mid-thoracic spinal and paraspinal areas with even light palpation. No swelling or palpable spasm.   Neurological: She is alert and oriented to person, place, and time.  Skin: Skin is warm and dry. No rash noted.  Psychiatric: She has a normal mood and affect.    ED Course  Procedures (including critical care time) Labs Review Labs Reviewed - No data to display Imaging Review No results found.  EKG Interpretation   None       MDM   Final diagnoses:  None    1. Recurrent back pain  No new symptoms. No shortness of breath, tachycardia or dyspnea to suggest PE. Abdomen completely non-tender. Treated for chronic pain and can follow up with PCP.     Dewaine Oats, PA-C 11/12/13 [phone]

## 2013-11-12 NOTE — ED Notes (Signed)
Pt states she is having back "spasm" through to abdomen under breast has been going on since yesterday,  Pain 10/10,  Nothing relieves it or makes it worse

## 2013-12-03 IMAGING — CR DG CHEST 2V
2 series · 2 of 2 positions shown · non-contrast
Comparison: 09/22/2011

CLINICAL DATA: Back pain between shoulder blades for 3 days
radiating to the chest.

CHEST - 2 VIEW

[w chest pa]
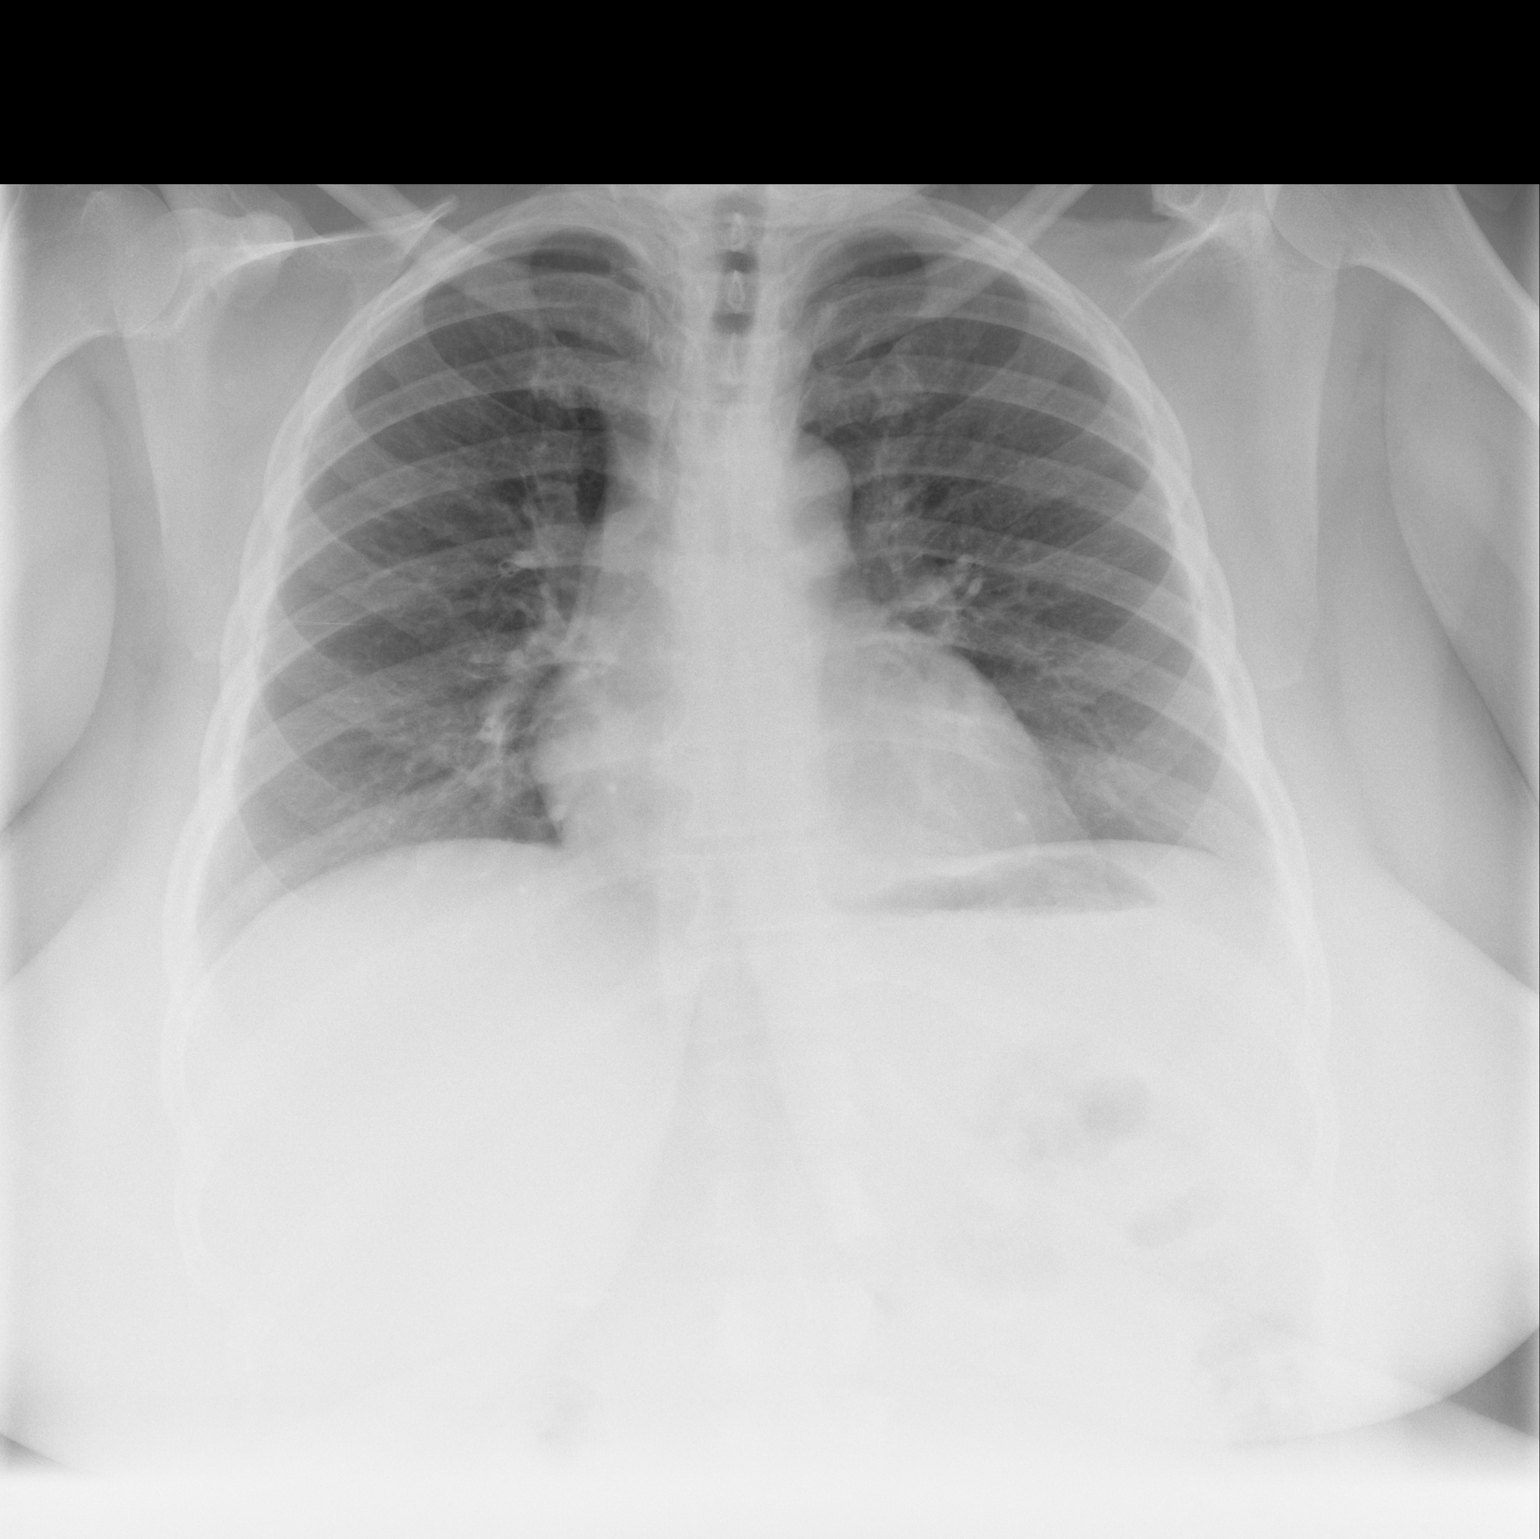

[w chest lat]
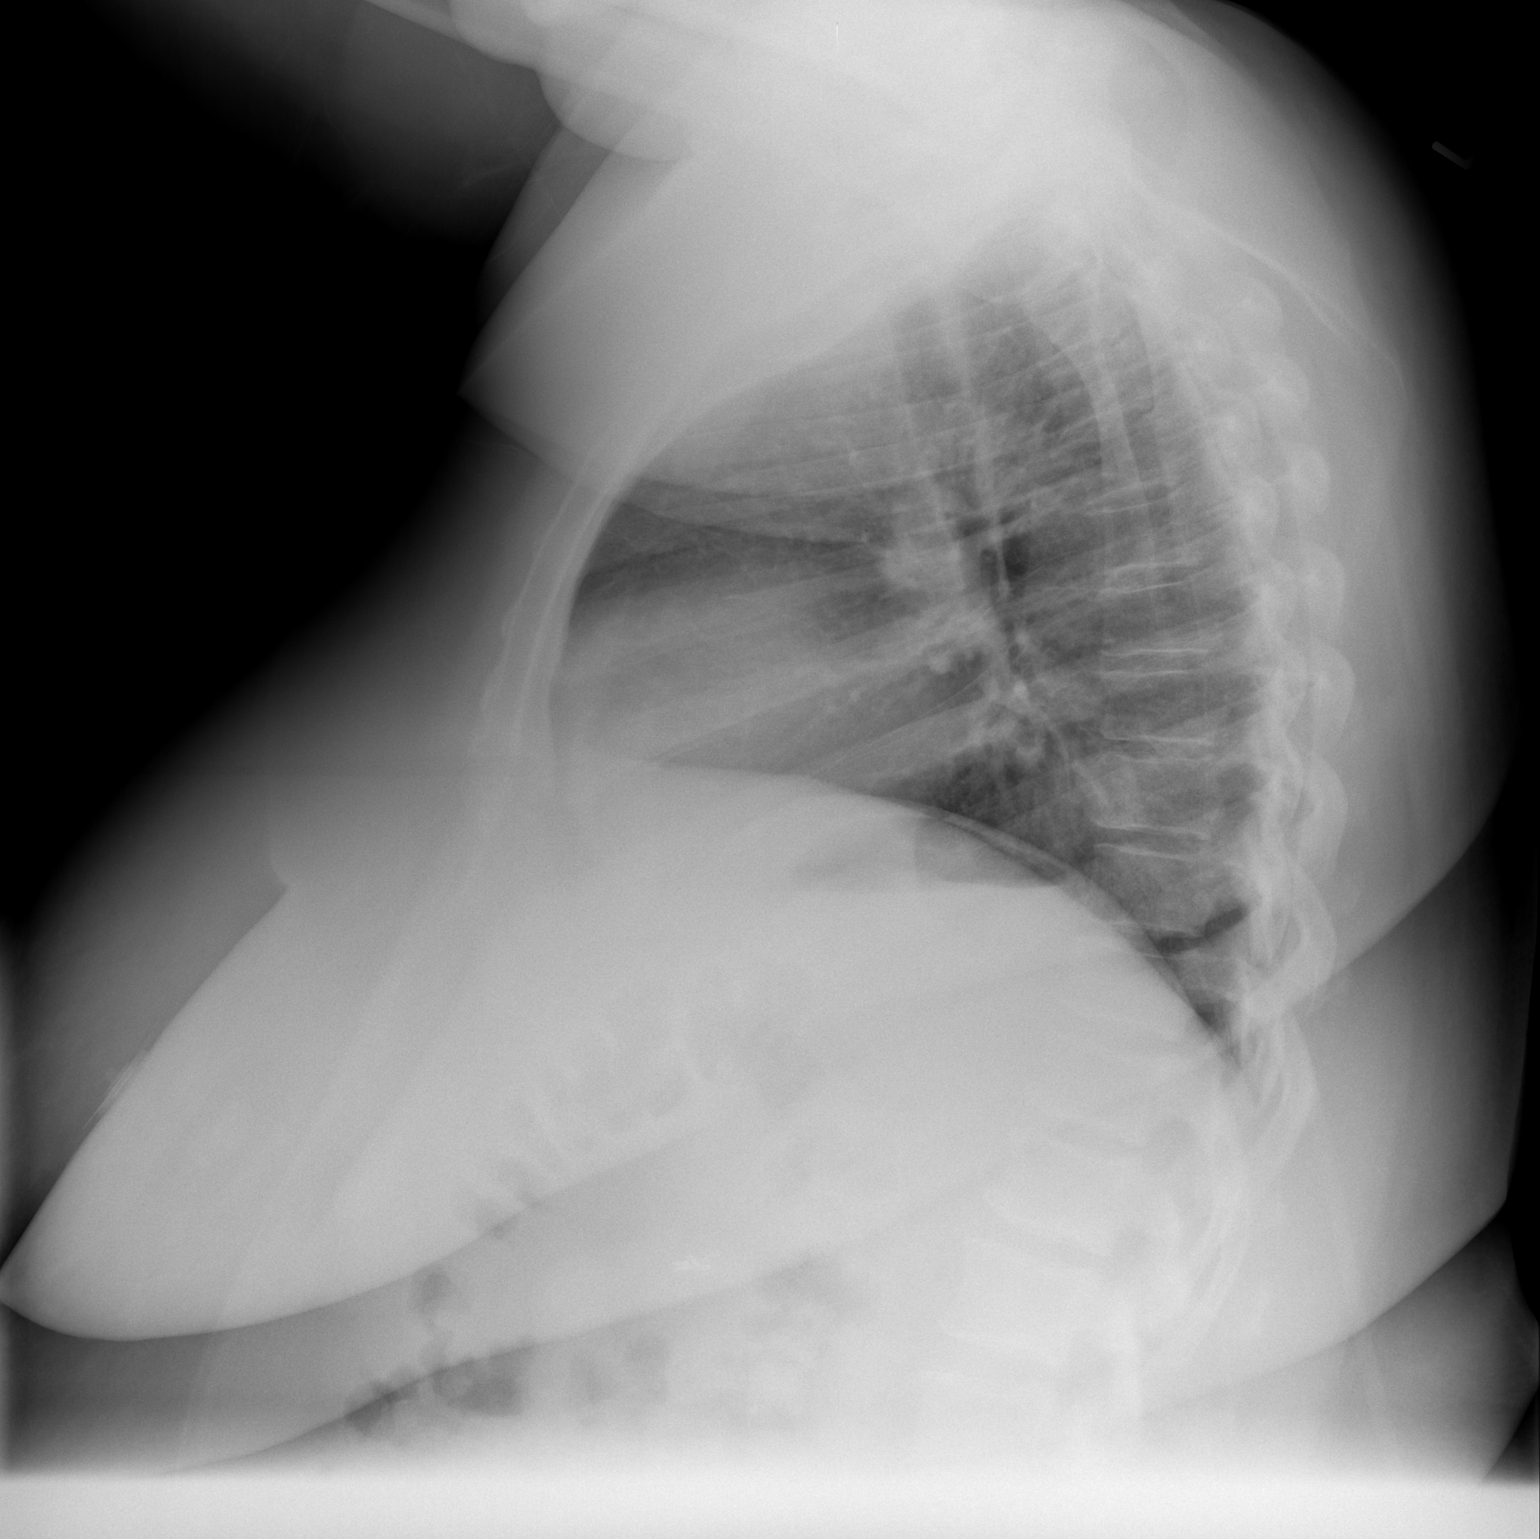

[2 of 2 positions shown; findings below may reference images not displayed]

FINDINGS: Shallow inspiration. The heart size and pulmonary
vascularity are normal. The lungs appear clear and expanded without
focal air space disease or consolidation. No blunting of the
costophrenic angles.  No pneumothorax.  Mediastinal contours appear
intact.  Surgical clips in the upper abdomen.  No significant
change since previous study.
IMPRESSION: No evidence of active pulmonary disease.

## 2013-12-22 ENCOUNTER — Encounter (HOSPITAL_COMMUNITY): Payer: Self-pay | Admitting: Emergency Medicine

## 2013-12-22 ENCOUNTER — Emergency Department (HOSPITAL_COMMUNITY)
Admission: EM | Admit: 2013-12-22 | Discharge: 2013-12-22 | Disposition: A | Discharge status: Home / Self Care | Payer: Medicaid Other | Attending: Emergency Medicine | Admitting: Emergency Medicine

## 2013-12-22 DIAGNOSIS — G8929 Other chronic pain: Secondary | ICD-10-CM | POA: Insufficient documentation

## 2013-12-22 DIAGNOSIS — M545 Low back pain, unspecified: Secondary | ICD-10-CM | POA: Insufficient documentation

## 2013-12-22 DIAGNOSIS — M129 Arthropathy, unspecified: Secondary | ICD-10-CM | POA: Insufficient documentation

## 2013-12-22 DIAGNOSIS — Z79899 Other long term (current) drug therapy: Secondary | ICD-10-CM | POA: Insufficient documentation

## 2013-12-22 DIAGNOSIS — IMO0001 Reserved for inherently not codable concepts without codable children: Secondary | ICD-10-CM | POA: Insufficient documentation

## 2013-12-22 DIAGNOSIS — Z8673 Personal history of transient ischemic attack (TIA), and cerebral infarction without residual deficits: Secondary | ICD-10-CM | POA: Insufficient documentation

## 2013-12-22 DIAGNOSIS — Z7982 Long term (current) use of aspirin: Secondary | ICD-10-CM | POA: Insufficient documentation

## 2013-12-22 DIAGNOSIS — E7800 Pure hypercholesterolemia, unspecified: Secondary | ICD-10-CM | POA: Insufficient documentation

## 2013-12-22 DIAGNOSIS — I10 Essential (primary) hypertension: Secondary | ICD-10-CM | POA: Insufficient documentation

## 2013-12-22 DIAGNOSIS — Z8719 Personal history of other diseases of the digestive system: Secondary | ICD-10-CM | POA: Insufficient documentation

## 2013-12-22 DIAGNOSIS — G43909 Migraine, unspecified, not intractable, without status migrainosus: Secondary | ICD-10-CM | POA: Insufficient documentation

## 2013-12-22 DIAGNOSIS — G4730 Sleep apnea, unspecified: Secondary | ICD-10-CM | POA: Insufficient documentation

## 2013-12-22 HISTORY — DX: Fibromyalgia: M79.7

## 2013-12-22 MED ORDER — HYDROMORPHONE HCL PF 1 MG/ML IJ SOLN
1.0000 mg | Freq: Once | INTRAMUSCULAR | Status: AC
  Administered 2013-12-22: 1 mg via INTRAMUSCULAR
  Filled 2013-12-22: qty 1

## 2013-12-22 NOTE — ED Provider Notes (Signed)
Medical screening examination/treatment/procedure(s) were performed by non-physician practitioner and as supervising physician I was immediately available for consultation/collaboration.   EKG Interpretation None        Blanchie Dessert, MD 12/22/13 1950

## 2013-12-22 NOTE — Discharge Instructions (Signed)
Please continue to follow up with your doctors for your chronic pain.    Chronic Pain Chronic pain can be defined as pain that is off and on and lasts for 3 6 months or longer. Many things cause chronic pain, which can make it difficult to make a diagnosis. There are many treatment options available for chronic pain. However, finding a treatment that works well for you may require trying various approaches until the right one is found. Many people benefit from a combination of two or more types of treatment to control their pain. SYMPTOMS  Chronic pain can occur anywhere in the body and can range from mild to very severe. Some types of chronic pain include:  Headache.  Low back pain.  Cancer pain.  Arthritis pain.  Neurogenic pain. This is pain resulting from damage to nerves. People with chronic pain may also have other symptoms such as:  Depression.  Anger.  Insomnia.  Anxiety. DIAGNOSIS  Your health care provider will help diagnose your condition over time. In many cases, the initial focus will be on excluding possible conditions that could be causing the pain. Depending on your symptoms, your health care provider may order tests to diagnose your condition. Some of these tests may include:   Blood tests.   CT scan.   MRI.   X-rays.   Ultrasounds.   Nerve conduction studies.  You may need to see a specialist.  TREATMENT  Finding treatment that works well may take time. You may be referred to a pain specialist. He or she may prescribe medicine or therapies, such as:   Mindful meditation or yoga.  Shots (injections) of numbing or pain-relieving medicines into the spine or area of pain.  Local electrical stimulation.  Acupuncture.   Massage therapy.   Aroma, color, light, or sound therapy.   Biofeedback.   Working with a physical therapist to keep from getting stiff.   Regular, gentle exercise.   Cognitive or behavioral therapy.   Group  support.  Sometimes, surgery may be recommended.  HOME CARE INSTRUCTIONS   Take all medicines as directed by your health care provider.   Lessen stress in your life by relaxing and doing things such as listening to calming music.   Exercise or be active as directed by your health care provider.   Eat a healthy diet and include things such as vegetables, fruits, fish, and lean meats in your diet.   Keep all follow-up appointments with your health care provider.   Attend a support group with others suffering from chronic pain. SEEK MEDICAL CARE IF:   Your pain gets worse.   You develop a new pain that was not there before.   You cannot tolerate medicines given to you by your health care provider.   You have new symptoms since your last visit with your health care provider.  SEEK IMMEDIATE MEDICAL CARE IF:   You feel weak.   You have decreased sensation or numbness.   You lose control of bowel or bladder function.   Your pain suddenly gets much worse.   You develop shaking.  You develop chills.  You develop confusion.  You develop chest pain.  You develop shortness of breath.  MAKE SURE YOU:  Understand these instructions.  Will watch your condition.  Will get help right away if you are not doing well or get worse. Document Released: 06/07/2002 Document Revised: 05/18/2013 Document Reviewed: 03/11/2013 [facility] Patient Information 2014 [place].

## 2013-12-22 NOTE — ED Notes (Signed)
Pt c/o low back pain with radiating to bilat hips on set yesterday. Pt has taken normal pain medication with no relief.

## 2013-12-22 NOTE — ED Provider Notes (Signed)
CSN: [id number]     Arrival date & time 12/22/13  0035 History   First MD Initiated Contact with Patient 12/22/13 0319     Chief Complaint  Patient presents with  . Back Pain   HPI  History provided by the patient. Patient is a 53 year old female with history of hypertension, hypercholesterolemia, fibromyalgia and chronic low back pain who presents with complaints of uncontrolled low back pain. Patient states that she is having her typical fibromyalgia and low back pain from a bulging disc however her pain was more severe yesterday and not improved with her normal medications. She states that she is on Cymbalta and oxycodone for her pain and symptoms but they were not helping significantly. She does state that she has planned to followup next Monday to receive spinal injections to help with her bulging disc and chronic back pains. She has never received injections before. She did not have any new injuries or trauma to make her pains worse yesterday. The pain is in the diffuse back especially the low back radiating towards the hips. Denies any radiating pain to the lower extremities. No urinary or fecal incontinence, urinary retention or perineal numbness. Denies any fever, chills or sweats.    Past Medical History  Diagnosis Date  . Hypertension   . Hypercholesteremia   . Lower back pain   . Nontoxic uninodular goiter     sees dr vollmer at [facility]  . Calculus of gallbladder without mention of cholecystitis or obstruction   . Arthritis   . Stroke 2000  . Migraine   . Sleep apnea     STOPBANG=5  . Fibromyalgia    Past Surgical History  Procedure Laterality Date  . Knee arthroscopy  one 1995 and 1 in 1997    both knees done  . Abdominal hysterectomy    . Cesarean section  yrs ago    done x 2  . Surgery for endometriosis  yrs ago  . Thryoid biopsy  December 01, 2011     at [facility]  . Cholecystectomy  01/05/2012    Procedure: LAPAROSCOPIC CHOLECYSTECTOMY WITH INTRAOPERATIVE CHOLANGIOGRAM;   Surgeon: Pedro Earls, MD;  Location: [facility];  Service: General;  Laterality: N/A;  . Colonoscopy  10/08/2012    Procedure: COLONOSCOPY;  Surgeon: Beryle Beams, MD;  Location: [facility];  Service: Endoscopy;  Laterality: N/A;   Family History  Problem Relation Age of Onset  . Cancer Brother     colon and lung  . Cancer Maternal Grandmother     colon   History  Substance Use Topics  . Smoking status: Never Smoker   . Smokeless tobacco: Never Used  . Alcohol Use: No   OB History   Grav Para Term Preterm Abortions TAB SAB Ect Mult Living                 Review of Systems  Constitutional: Negative for fever, chills and diaphoresis.  Gastrointestinal: Negative for nausea, vomiting and diarrhea.  Genitourinary: Negative for dysuria, frequency, hematuria and flank pain.  Musculoskeletal: Positive for back pain.  Neurological: Negative for weakness.  All other systems reviewed and are negative.      Allergies  Shrimp and Naproxen  Home Medications   Current Outpatient Rx  Name  Route  Sig  Dispense  Refill  . amLODipine (NORVASC) 5 MG tablet   Oral   Take 5 mg by mouth every morning.          Marland Kitchen aspirin  EC 81 MG tablet   Oral   Take 81 mg by mouth daily.         . DULoxetine (CYMBALTA) 60 MG capsule   Oral   Take 60 mg by mouth daily.         Marland Kitchen lisinopril-hydrochlorothiazide (PRINZIDE,ZESTORETIC) 20-25 MG per tablet   Oral   Take 1 tablet by mouth every morning.          . loratadine (CLARITIN) 10 MG tablet   Oral   Take 10 mg by mouth every morning.          . metoprolol succinate (TOPROL-XL) 50 MG 24 hr tablet   Oral   Take 50 mg by mouth daily. Take with or immediately following a meal.         . oxycodone (OXY-IR) 5 MG capsule   Oral   Take 5 mg by mouth 2 (two) times daily as needed.         . pravastatin (PRAVACHOL) 40 MG tablet   Oral   Take 40 mg by mouth at bedtime.          Marland Kitchen tiZANidine (ZANAFLEX) 4 MG tablet   Oral    Take 4 mg by mouth every 6 (six) hours as needed for muscle spasms.         Marland Kitchen topiramate (TOPAMAX) 25 MG tablet   Oral   Take 25 mg by mouth daily.         . Vitamin D, Ergocalciferol, (DRISDOL) 50000 UNITS CAPS capsule   Oral   Take 50,000 Units by mouth every 7 (seven) days.          BP 159/91  Pulse 90  Temp(Src) 98.6 F (37 C) (Oral)  Resp 20  Ht 4\' 11"  (1.499 m)  Wt 276 lb (125.193 kg)  BMI 55.72 kg/m2  SpO2 97% Physical Exam  Nursing note and vitals reviewed. Constitutional: She is oriented to person, place, and time. She appears well-developed and well-nourished. No distress.  HENT:  Head: Normocephalic.  Cardiovascular: Normal rate and regular rhythm.   Pulmonary/Chest: Effort normal and breath sounds normal. No respiratory distress. She has no wheezes.  Abdominal: Soft.  Obese. No CVA tenderness.  Musculoskeletal: Normal range of motion.       Lumbar back: She exhibits tenderness.       Back:  Neurological: She is alert and oriented to person, place, and time. She has normal strength. She displays a negative Romberg sign. Gait normal.  Skin: Skin is warm and dry. No rash noted.  Psychiatric: She has a normal mood and affect. Her behavior is normal.    ED Course  Procedures   DIAGNOSTIC STUDIES: Oxygen Saturation is 97% on RA.    COORDINATION OF CARE:  Nursing notes reviewed. Vital signs reviewed. Initial pt interview and examination performed.   3:34 AM-patient seen and evaluated. Patient appears uncomfortable and pain with moderate distress. No significant changes in her chronic pain. No new concerning or red flag symptoms. No indications or concerns for cuada equina syndrome.   Patient treated for her pain and encouraged to continue her followup plans with her doctors.  4:30 AM patient feeling much better after medication. She is ready to return home.  Treatment plan initiated: Medications  HYDROmorphone (DILAUDID) injection 1 mg (1 mg  Intramuscular Given 12/22/13 0400)         MDM   Final diagnoses:  Chronic pain        Martie Lee,  PA-C 12/22/13 0435

## 2014-01-02 ENCOUNTER — Emergency Department (HOSPITAL_COMMUNITY)
Admission: EM | Admit: 2014-01-02 | Discharge: 2014-01-03 | Disposition: A | Discharge status: Home / Self Care | Payer: Medicaid Other | Attending: Emergency Medicine | Admitting: Emergency Medicine

## 2014-01-02 DIAGNOSIS — G43909 Migraine, unspecified, not intractable, without status migrainosus: Secondary | ICD-10-CM | POA: Insufficient documentation

## 2014-01-02 DIAGNOSIS — IMO0002 Reserved for concepts with insufficient information to code with codable children: Secondary | ICD-10-CM | POA: Insufficient documentation

## 2014-01-02 DIAGNOSIS — I10 Essential (primary) hypertension: Secondary | ICD-10-CM | POA: Insufficient documentation

## 2014-01-02 DIAGNOSIS — M545 Low back pain, unspecified: Secondary | ICD-10-CM | POA: Insufficient documentation

## 2014-01-02 DIAGNOSIS — G8929 Other chronic pain: Secondary | ICD-10-CM

## 2014-01-02 DIAGNOSIS — Z8739 Personal history of other diseases of the musculoskeletal system and connective tissue: Secondary | ICD-10-CM | POA: Insufficient documentation

## 2014-01-02 DIAGNOSIS — E785 Hyperlipidemia, unspecified: Secondary | ICD-10-CM | POA: Insufficient documentation

## 2014-01-02 DIAGNOSIS — Z8673 Personal history of transient ischemic attack (TIA), and cerebral infarction without residual deficits: Secondary | ICD-10-CM | POA: Insufficient documentation

## 2014-01-02 DIAGNOSIS — Z79899 Other long term (current) drug therapy: Secondary | ICD-10-CM | POA: Insufficient documentation

## 2014-01-02 DIAGNOSIS — Z8719 Personal history of other diseases of the digestive system: Secondary | ICD-10-CM | POA: Insufficient documentation

## 2014-01-02 DIAGNOSIS — M549 Dorsalgia, unspecified: Secondary | ICD-10-CM

## 2014-01-02 NOTE — ED Notes (Signed)
Pt states she has fibromyalgia and a buldging disk and is now having more lower back pain than normal. States she took cymbalta, oxycodone and flexeril at home and is still hurting. Ambulatory. Alert and oriented.

## 2014-01-03 ENCOUNTER — Encounter (HOSPITAL_COMMUNITY): Payer: Self-pay | Admitting: Emergency Medicine

## 2014-01-03 MED ORDER — HYDROMORPHONE HCL PF 1 MG/ML IJ SOLN
1.0000 mg | Freq: Once | INTRAMUSCULAR | Status: AC
  Administered 2014-01-03: 1 mg via INTRAMUSCULAR
  Filled 2014-01-03: qty 1

## 2014-01-03 NOTE — Discharge Instructions (Signed)
Chronic Back Pain   When back pain lasts longer than 3 months, it is called chronic back pain.People with chronic back pain often go through certain periods that are more intense (flare-ups).   CAUSES  Chronic back pain can be caused by wear and tear (degeneration) on different structures in your back. These structures include:   The bones of your spine (vertebrae) and the joints surrounding your spinal cord and nerve roots (facets).   The strong, fibrous tissues that connect your vertebrae (ligaments).  Degeneration of these structures may result in pressure on your nerves. This can lead to constant pain.  HOME CARE INSTRUCTIONS   Avoid bending, heavy lifting, prolonged sitting, and activities which make the problem worse.   Take brief periods of rest throughout the day to reduce your pain. Lying down or standing usually is better than sitting while you are resting.   Take over-the-counter or prescription medicines only as directed by your caregiver.  SEEK IMMEDIATE MEDICAL CARE IF:    You have weakness or numbness in one of your legs or feet.   You have trouble controlling your bladder or bowels.   You have nausea, vomiting, abdominal pain, shortness of breath, or fainting.  Document Released: 10/23/2004 Document Revised: 12/08/2011 Document Reviewed: 08/30/2011  [employer] Patient Information 2014 [employer]

## 2014-01-03 NOTE — ED Provider Notes (Signed)
CSN: [id number]     Arrival date & time 01/02/14  2101 History   First MD Initiated Contact with Patient 01/02/14 2340     Chief Complaint  Patient presents with  . Back Pain     (Consider location/radiation/quality/duration/timing/severity/associated sxs/prior Treatment) Patient is a 53 y.o. female presenting with back pain. The history is provided by the patient. No language interpreter was used.  Back Pain Location:  Lumbar spine Chronicity:  Chronic Associated symptoms: no fever, no headaches and no numbness   Associated symptoms comment:  Patient returns with chronic lower back pain uncontrolled in outpatient setting. No new symptoms and no new injury. No urinary or bowel incontinence.    Past Medical History  Diagnosis Date  . Hypertension   . Hypercholesteremia   . Lower back pain   . Nontoxic uninodular goiter     sees dr vollmer at [facility]  . Calculus of gallbladder without mention of cholecystitis or obstruction   . Arthritis   . Stroke 2000  . Migraine   . Sleep apnea     STOPBANG=5  . Fibromyalgia    Past Surgical History  Procedure Laterality Date  . Knee arthroscopy  one 1995 and 1 in 1997    both knees done  . Abdominal hysterectomy    . Cesarean section  yrs ago    done x 2  . Surgery for endometriosis  yrs ago  . Thryoid biopsy  December 01, 2011     at [facility]  . Cholecystectomy  01/05/2012    Procedure: LAPAROSCOPIC CHOLECYSTECTOMY WITH INTRAOPERATIVE CHOLANGIOGRAM;  Surgeon: Pedro Earls, MD;  Location: [facility];  Service: General;  Laterality: N/A;  . Colonoscopy  10/08/2012    Procedure: COLONOSCOPY;  Surgeon: Beryle Beams, MD;  Location: [facility];  Service: Endoscopy;  Laterality: N/A;   Family History  Problem Relation Age of Onset  . Cancer Brother     colon and lung  . Cancer Maternal Grandmother     colon   History  Substance Use Topics  . Smoking status: Never Smoker   . Smokeless tobacco: Never Used  . Alcohol Use: No   OB  History   Grav Para Term Preterm Abortions TAB SAB Ect Mult Living                 Review of Systems  Constitutional: Negative for fever and chills.  Respiratory: Negative.   Cardiovascular: Negative.   Gastrointestinal: Negative.  Negative for nausea.  Musculoskeletal: Positive for back pain.  Skin: Negative.   Neurological: Negative.  Negative for numbness and headaches.      Allergies  Shrimp and Naproxen  Home Medications   Current Outpatient Rx  Name  Route  Sig  Dispense  Refill  . amLODipine (NORVASC) 10 MG tablet   Oral   Take 10 mg by mouth daily.         . cyclobenzaprine (FLEXERIL) 10 MG tablet   Oral   Take 10 mg by mouth 3 (three) times daily as needed for muscle spasms.         . DULoxetine (CYMBALTA) 60 MG capsule   Oral   Take 60 mg by mouth daily.         . fluticasone (FLONASE) 50 MCG/ACT nasal spray   Each Nare   Place 2 sprays into both nostrils daily.         Marland Kitchen lisinopril-hydrochlorothiazide (PRINZIDE,ZESTORETIC) 20-25 MG per tablet   Oral   Take 1  tablet by mouth every morning.          . loratadine (CLARITIN) 10 MG tablet   Oral   Take 10 mg by mouth every morning.          . metoprolol succinate (TOPROL-XL) 50 MG 24 hr tablet   Oral   Take 50 mg by mouth daily. Take with or immediately following a meal.         . oxycodone (OXY-IR) 5 MG capsule   Oral   Take 5 mg by mouth 2 (two) times daily as needed (for pain).          . pravastatin (PRAVACHOL) 40 MG tablet   Oral   Take 40 mg by mouth at bedtime.          Marland Kitchen tiZANidine (ZANAFLEX) 4 MG tablet   Oral   Take 4 mg by mouth every 6 (six) hours as needed for muscle spasms.         Marland Kitchen topiramate (TOPAMAX) 25 MG tablet   Oral   Take 75 mg by mouth 2 (two) times daily.          . Vitamin D, Ergocalciferol, (DRISDOL) 50000 UNITS CAPS capsule   Oral   Take 50,000 Units by mouth every 7 (seven) days.          BP 142/82  Pulse 84  Temp(Src) 97.8 F (36.6  C) (Oral)  Resp 20  SpO2 99% Physical Exam  Constitutional: She is oriented to person, place, and time. She appears well-developed and well-nourished.  HENT:  Head: Normocephalic.  Neck: Normal range of motion. Neck supple.  Pulmonary/Chest: Effort normal.  Abdominal: Soft. Bowel sounds are normal. There is no tenderness. There is no rebound and no guarding.  Musculoskeletal: Normal range of motion.  Mild lumbar and paralumbar tenderness without swelling or discoloration.  Neurological: She is alert and oriented to person, place, and time. She has normal reflexes. Coordination normal.  Skin: Skin is warm and dry. No rash noted.  Psychiatric: She has a normal mood and affect.    ED Course  Procedures (including critical care time) Labs Review Labs Reviewed - No data to display Imaging Review No results found.   EKG Interpretation None      MDM   Final diagnoses:  Chronic back pain    Patient with chronic back pain without new findings or neurologic red flags. Stable for discharge.     Dewaine Oats, PA-C 01/03/14 1842

## 2014-01-04 NOTE — ED Provider Notes (Signed)
Medical screening examination/treatment/procedure(s) were performed by non-physician practitioner and as supervising physician I was immediately available for consultation/collaboration.   EKG Interpretation None        Osvaldo Shipper, MD 01/04/14 0000

## 2014-01-20 ENCOUNTER — Encounter (HOSPITAL_COMMUNITY): Payer: Self-pay | Admitting: Emergency Medicine

## 2014-01-20 ENCOUNTER — Emergency Department (HOSPITAL_COMMUNITY)
Admission: EM | Admit: 2014-01-20 | Discharge: 2014-01-20 | Disposition: A | Discharge status: Home / Self Care | Payer: Medicaid Other | Attending: Emergency Medicine | Admitting: Emergency Medicine

## 2014-01-20 DIAGNOSIS — Z79899 Other long term (current) drug therapy: Secondary | ICD-10-CM | POA: Insufficient documentation

## 2014-01-20 DIAGNOSIS — I10 Essential (primary) hypertension: Secondary | ICD-10-CM | POA: Insufficient documentation

## 2014-01-20 DIAGNOSIS — Z8739 Personal history of other diseases of the musculoskeletal system and connective tissue: Secondary | ICD-10-CM | POA: Insufficient documentation

## 2014-01-20 DIAGNOSIS — IMO0002 Reserved for concepts with insufficient information to code with codable children: Secondary | ICD-10-CM | POA: Insufficient documentation

## 2014-01-20 DIAGNOSIS — Z8673 Personal history of transient ischemic attack (TIA), and cerebral infarction without residual deficits: Secondary | ICD-10-CM | POA: Insufficient documentation

## 2014-01-20 DIAGNOSIS — Z8719 Personal history of other diseases of the digestive system: Secondary | ICD-10-CM | POA: Insufficient documentation

## 2014-01-20 DIAGNOSIS — G8929 Other chronic pain: Secondary | ICD-10-CM

## 2014-01-20 DIAGNOSIS — G43909 Migraine, unspecified, not intractable, without status migrainosus: Secondary | ICD-10-CM | POA: Insufficient documentation

## 2014-01-20 DIAGNOSIS — M545 Low back pain, unspecified: Secondary | ICD-10-CM | POA: Insufficient documentation

## 2014-01-20 DIAGNOSIS — E7800 Pure hypercholesterolemia, unspecified: Secondary | ICD-10-CM | POA: Insufficient documentation

## 2014-01-20 MED ORDER — HYDROMORPHONE HCL PF 1 MG/ML IJ SOLN
2.0000 mg | Freq: Once | INTRAMUSCULAR | Status: AC
  Administered 2014-01-20: 2 mg via INTRAMUSCULAR
  Filled 2014-01-20: qty 2

## 2014-01-20 NOTE — ED Notes (Signed)
The pt has chronic back pain and has had so for years.  She has intermittent flare-ups.  She is currently having pain.  No new injury

## 2014-01-20 NOTE — ED Provider Notes (Signed)
CSN: [id number]     Arrival date & time 01/20/14  2131 History  This chart was scribed for non-physician practitioner, Charlann Lange, PA-C working with Ephraim Hamburger, MD by Frederich Balding, ED scribe. This patient was seen in room TR06C/TR06C and the patient's care was started at 10:15 PM.   Chief Complaint  Patient presents with  . Back Pain   The history is provided by the patient. No language interpreter was used.   HPI Comments: Jodi Bowman is a 53 y.o. female who presents to the Emergency Department complaining of chronic lower back pain. Pt was evaluated here for the same 16 days ago. Pt has followed up with Dr. Deatra Ina and states she was told there was nothing else that could be done. She is now in pain management and had injections done 01/02/14. Her next one is scheduled in June. Denies abdominal pain, bowel or bladder incontinence.   Past Medical History  Diagnosis Date  . Hypertension   . Hypercholesteremia   . Lower back pain   . Nontoxic uninodular goiter     sees dr vollmer at [facility]  . Calculus of gallbladder without mention of cholecystitis or obstruction   . Arthritis   . Stroke 2000  . Migraine   . Sleep apnea     STOPBANG=5  . Fibromyalgia    Past Surgical History  Procedure Laterality Date  . Knee arthroscopy  one 1995 and 1 in 1997    both knees done  . Abdominal hysterectomy    . Cesarean section  yrs ago    done x 2  . Surgery for endometriosis  yrs ago  . Thryoid biopsy  December 01, 2011     at [facility]  . Cholecystectomy  01/05/2012    Procedure: LAPAROSCOPIC CHOLECYSTECTOMY WITH INTRAOPERATIVE CHOLANGIOGRAM;  Surgeon: Pedro Earls, MD;  Location: [facility];  Service: General;  Laterality: N/A;  . Colonoscopy  10/08/2012    Procedure: COLONOSCOPY;  Surgeon: Beryle Beams, MD;  Location: [facility];  Service: Endoscopy;  Laterality: N/A;   Family History  Problem Relation Age of Onset  . Cancer Brother     colon and lung  . Cancer Maternal  Grandmother     colon   History  Substance Use Topics  . Smoking status: Never Smoker   . Smokeless tobacco: Never Used  . Alcohol Use: No   OB History   Grav Para Term Preterm Abortions TAB SAB Ect Mult Living                 Review of Systems  Gastrointestinal: Negative for abdominal pain.  Genitourinary:       Negative for bowel or bladder incontinence.   Musculoskeletal: Positive for back pain.  All other systems reviewed and are negative.  Allergies  Shrimp and Naproxen  Home Medications   Prior to Admission medications   Medication Sig Start Date End Date Taking? Authorizing Provider  amLODipine (NORVASC) 10 MG tablet Take 10 mg by mouth daily.    Historical Provider, MD  cyclobenzaprine (FLEXERIL) 10 MG tablet Take 10 mg by mouth 3 (three) times daily as needed for muscle spasms.    Historical Provider, MD  DULoxetine (CYMBALTA) 60 MG capsule Take 60 mg by mouth daily.    Historical Provider, MD  fluticasone (FLONASE) 50 MCG/ACT nasal spray Place 2 sprays into both nostrils daily.    Historical Provider, MD  lisinopril-hydrochlorothiazide (PRINZIDE,ZESTORETIC) 20-25 MG per tablet Take 1 tablet by mouth  every morning.     Historical Provider, MD  loratadine (CLARITIN) 10 MG tablet Take 10 mg by mouth every morning.     Historical Provider, MD  metoprolol succinate (TOPROL-XL) 50 MG 24 hr tablet Take 50 mg by mouth daily. Take with or immediately following a meal.    Historical Provider, MD  oxycodone (OXY-IR) 5 MG capsule Take 5 mg by mouth 2 (two) times daily as needed (for pain).     Historical Provider, MD  pravastatin (PRAVACHOL) 40 MG tablet Take 40 mg by mouth at bedtime.     Historical Provider, MD  tiZANidine (ZANAFLEX) 4 MG tablet Take 4 mg by mouth every 6 (six) hours as needed for muscle spasms.    Historical Provider, MD  topiramate (TOPAMAX) 25 MG tablet Take 75 mg by mouth 2 (two) times daily.     Historical Provider, MD  Vitamin D, Ergocalciferol, (DRISDOL)  50000 UNITS CAPS capsule Take 50,000 Units by mouth every 7 (seven) days.    Historical Provider, MD   BP 136/88  Pulse 98  Temp(Src) 97.6 F (36.4 C) (Oral)  Resp 18  SpO2 97%  Physical Exam  Nursing note and vitals reviewed. Constitutional: She is oriented to person, place, and time. She appears well-developed and well-nourished. No distress.  HENT:  Head: Normocephalic and atraumatic.  Eyes: EOM are normal.  Neck: Neck supple. No tracheal deviation present.  Cardiovascular: Normal rate.   Pulmonary/Chest: Effort normal. No respiratory distress.  Abdominal: Soft. There is no tenderness.  Musculoskeletal: Normal range of motion.  No acute swelling of the lower back. Para lumbar tenderness.   Neurological: She is alert and oriented to person, place, and time.  Skin: Skin is warm and dry.  Psychiatric: She has a normal mood and affect. Her behavior is normal.    ED Course  Procedures (including critical care time)  DIAGNOSTIC STUDIES: Oxygen Saturation is 97% on RA, normal by my interpretation.    COORDINATION OF CARE: 10:17 PM-Discussed treatment plan which includes a shot of pain medication in the ED with pt at bedside and pt agreed to plan.   Labs Review Labs Reviewed - No data to display  Imaging Review No results found.   EKG Interpretation None      MDM   Final diagnoses:  None    1. Chronic low back pain  She reports she is following up with PCP and with Pain Management but is not getting any relief of pain. IM pain medication given - discussed no Rxs for home use and patient understands.  I personally performed the services described in this documentation, which was scribed in my presence. The recorded information has been reviewed and is accurate.  Dewaine Oats, PA-C 01/20/14 2244

## 2014-01-20 NOTE — Discharge Instructions (Signed)

## 2014-01-23 ENCOUNTER — Encounter (HOSPITAL_COMMUNITY): Payer: Self-pay | Admitting: Emergency Medicine

## 2014-01-23 ENCOUNTER — Emergency Department (HOSPITAL_COMMUNITY): Payer: Medicaid Other

## 2014-01-23 ENCOUNTER — Emergency Department (HOSPITAL_COMMUNITY)
Admission: EM | Admit: 2014-01-23 | Discharge: 2014-01-23 | Disposition: A | Discharge status: Home / Self Care | Payer: Medicaid Other | Attending: Emergency Medicine | Admitting: Emergency Medicine

## 2014-01-23 DIAGNOSIS — Y9389 Activity, other specified: Secondary | ICD-10-CM | POA: Insufficient documentation

## 2014-01-23 DIAGNOSIS — G43909 Migraine, unspecified, not intractable, without status migrainosus: Secondary | ICD-10-CM | POA: Insufficient documentation

## 2014-01-23 DIAGNOSIS — Z8719 Personal history of other diseases of the digestive system: Secondary | ICD-10-CM | POA: Insufficient documentation

## 2014-01-23 DIAGNOSIS — M549 Dorsalgia, unspecified: Secondary | ICD-10-CM | POA: Insufficient documentation

## 2014-01-23 DIAGNOSIS — E7800 Pure hypercholesterolemia, unspecified: Secondary | ICD-10-CM | POA: Insufficient documentation

## 2014-01-23 DIAGNOSIS — Z79899 Other long term (current) drug therapy: Secondary | ICD-10-CM | POA: Insufficient documentation

## 2014-01-23 DIAGNOSIS — IMO0001 Reserved for inherently not codable concepts without codable children: Secondary | ICD-10-CM | POA: Insufficient documentation

## 2014-01-23 DIAGNOSIS — Z8673 Personal history of transient ischemic attack (TIA), and cerebral infarction without residual deficits: Secondary | ICD-10-CM | POA: Insufficient documentation

## 2014-01-23 DIAGNOSIS — G8929 Other chronic pain: Secondary | ICD-10-CM | POA: Insufficient documentation

## 2014-01-23 DIAGNOSIS — R55 Syncope and collapse: Secondary | ICD-10-CM | POA: Insufficient documentation

## 2014-01-23 DIAGNOSIS — S0993XA Unspecified injury of face, initial encounter: Secondary | ICD-10-CM | POA: Insufficient documentation

## 2014-01-23 DIAGNOSIS — M129 Arthropathy, unspecified: Secondary | ICD-10-CM | POA: Insufficient documentation

## 2014-01-23 DIAGNOSIS — Y9289 Other specified places as the place of occurrence of the external cause: Secondary | ICD-10-CM | POA: Insufficient documentation

## 2014-01-23 DIAGNOSIS — I10 Essential (primary) hypertension: Secondary | ICD-10-CM | POA: Insufficient documentation

## 2014-01-23 DIAGNOSIS — W1809XA Striking against other object with subsequent fall, initial encounter: Secondary | ICD-10-CM | POA: Insufficient documentation

## 2014-01-23 DIAGNOSIS — G4730 Sleep apnea, unspecified: Secondary | ICD-10-CM | POA: Insufficient documentation

## 2014-01-23 DIAGNOSIS — R61 Generalized hyperhidrosis: Secondary | ICD-10-CM | POA: Insufficient documentation

## 2014-01-23 DIAGNOSIS — S199XXA Unspecified injury of neck, initial encounter: Secondary | ICD-10-CM

## 2014-01-23 DIAGNOSIS — R51 Headache: Secondary | ICD-10-CM | POA: Insufficient documentation

## 2014-01-23 DIAGNOSIS — IMO0002 Reserved for concepts with insufficient information to code with codable children: Secondary | ICD-10-CM | POA: Insufficient documentation

## 2014-01-23 DIAGNOSIS — R000 Tachycardia, unspecified: Secondary | ICD-10-CM | POA: Insufficient documentation

## 2014-01-23 LAB — BASIC METABOLIC PANEL
BUN: 15 mg/dL (ref 6–23)
CO2: 26 mEq/L (ref 19–32)
Calcium: 9.3 mg/dL (ref 8.4–10.5)
Chloride: 96 mEq/L (ref 96–112)
Creatinine, Ser: 1.12 mg/dL — ABNORMAL HIGH (ref 0.50–1.10)
GFR calc Af Amer: 63 mL/min — ABNORMAL LOW (ref >90)
GFR calc non Af Amer: 55 mL/min — ABNORMAL LOW (ref >90)
Glucose, Bld: 125 mg/dL — ABNORMAL HIGH (ref 70–99)
Potassium: 3.8 mEq/L (ref 3.7–5.3)
Sodium: 136 mEq/L — ABNORMAL LOW (ref 137–147)

## 2014-01-23 LAB — CBC
HCT: 44.1 % (ref 36.0–46.0)
Hemoglobin: 15.1 g/dL — ABNORMAL HIGH (ref 12.0–15.0)
MCH: 29.7 pg (ref 26.0–34.0)
MCHC: 34.2 g/dL (ref 30.0–36.0)
MCV: 86.6 fL (ref 78.0–100.0)
Platelets: 323 10*3/uL (ref 150–400)
RBC: 5.09 MIL/uL (ref 3.87–5.11)
RDW: 15 % (ref 11.5–15.5)
WBC: 11.6 10*3/uL — ABNORMAL HIGH (ref 4.0–10.5)

## 2014-01-23 LAB — I-STAT TROPONIN, ED: Troponin i, poc: 0.01 ng/mL (ref 0.00–0.08)

## 2014-01-23 LAB — D-DIMER, QUANTITATIVE (NOT AT ARMC): D-Dimer, Quant: 0.46 ug/mL-FEU (ref 0.00–0.48)

## 2014-01-23 MED ORDER — SODIUM CHLORIDE 0.9 % IV BOLUS (SEPSIS)
1000.0000 mL | Freq: Once | INTRAVENOUS | Status: AC
  Administered 2014-01-23: 1000 mL via INTRAVENOUS

## 2014-01-23 MED ORDER — OXYCODONE-ACETAMINOPHEN 5-325 MG PO TABS
1.0000 | ORAL_TABLET | Freq: Once | ORAL | Status: AC
  Administered 2014-01-23: 1 via ORAL
  Filled 2014-01-23: qty 1

## 2014-01-23 NOTE — ED Provider Notes (Signed)
Medical screening examination/treatment/procedure(s) were performed by non-physician practitioner and as supervising physician I was immediately available for consultation/collaboration.   EKG Interpretation None        Ephraim Hamburger, MD 01/23/14 [phone]

## 2014-01-23 NOTE — ED Notes (Signed)
Pt reports continued dizziness. Denies blurred vision. Neuro intact. NAD. Apologized for wait time. Pt made aware of triage process.

## 2014-01-23 NOTE — ED Notes (Signed)
Pt reports that she was going to the bathroom and states that she started feeling hot and dizzy. States that she leaned forward and fell into the tub. Reports a headache at this time. Denies any chest pain or SOB. No neuro deficits.

## 2014-01-23 NOTE — Discharge Instructions (Signed)
Make sure that you drink 6 eight ounce lasts of water per day. We feel that dehydration is likely the cause of your thinking today. Return if you feel worse for any reason or if you have more fainting episodes, or see your doctor

## 2014-01-23 NOTE — ED Notes (Signed)
During orthostatic v/s (lying, sitting, and standing) sign pt stated that she felt short of breath placed pt on 2 liters of oxygen pt also stated that she felt dizzy while standing. No other complaints noted

## 2014-01-23 NOTE — ED Provider Notes (Signed)
CSN: [id number]     Arrival date & time 01/23/14  1513 History   First MD Initiated Contact with Patient 01/23/14 2050     Chief Complaint  Patient presents with  . Loss of Consciousness     (Consider location/radiation/quality/duration/timing/severity/associated sxs/prior Treatment) HPI She complains of headache gradual onset diffuse 4 days ago. She also complains of lightheadedness with standing onset 3 days ago no other associated symptoms no shortness of breath no abdominal pain no chest pain no blood parenchyma . She treated self with Percocet 1 tablet twice daily for chronic back pain no visual changes. No trauma. She reports that today while sitting on the toilet she became sweaty and separate syncopal event causing her fall and strike her upper lip. No other injury. No laceration. No other associated symptoms Past Medical History  Diagnosis Date  . Hypertension   . Hypercholesteremia   . Lower back pain   . Nontoxic uninodular goiter     sees dr vollmer at [facility]  . Calculus of gallbladder without mention of cholecystitis or obstruction   . Arthritis   . Stroke 2000  . Migraine   . Sleep apnea     STOPBANG=5  . Fibromyalgia   had myocardial spect test 1/15, normal had mri brain 1/15 showing no acute abnormality Past Surgical History  Procedure Laterality Date  . Knee arthroscopy  one 1995 and 1 in 1997    both knees done  . Abdominal hysterectomy    . Cesarean section  yrs ago    done x 2  . Surgery for endometriosis  yrs ago  . Thryoid biopsy  December 01, 2011     at [facility]  . Cholecystectomy  01/05/2012    Procedure: LAPAROSCOPIC CHOLECYSTECTOMY WITH INTRAOPERATIVE CHOLANGIOGRAM;  Surgeon: Pedro Earls, MD;  Location: [facility];  Service: General;  Laterality: N/A;  . Colonoscopy  10/08/2012    Procedure: COLONOSCOPY;  Surgeon: Beryle Beams, MD;  Location: [facility];  Service: Endoscopy;  Laterality: N/A;   Family History  Problem Relation Age of Onset  . Cancer  Brother     colon and lung  . Cancer Maternal Grandmother     colon   History  Substance Use Topics  . Smoking status: Never Smoker   . Smokeless tobacco: Never Used  . Alcohol Use: No   OB History   Grav Para Term Preterm Abortions TAB SAB Ect Mult Living                 Review of Systems  Constitutional: Negative.   Respiratory: Negative.   Cardiovascular: Negative.   Gastrointestinal: Negative.   Musculoskeletal: Positive for back pain.  Skin: Negative.   Neurological: Positive for light-headedness and headaches.  Psychiatric/Behavioral: Negative.   All other systems reviewed and are negative.     Allergies  Shrimp and Naproxen  Home Medications   Prior to Admission medications   Medication Sig Start Date End Date Taking? Authorizing Provider  amLODipine (NORVASC) 10 MG tablet Take 10 mg by mouth daily.   Yes Historical Provider, MD  cyclobenzaprine (FLEXERIL) 10 MG tablet Take 10 mg by mouth 3 (three) times daily as needed for muscle spasms.   Yes Historical Provider, MD  DULoxetine (CYMBALTA) 60 MG capsule Take 60 mg by mouth daily.   Yes Historical Provider, MD  fluticasone (FLONASE) 50 MCG/ACT nasal spray Place 2 sprays into both nostrils daily.   Yes Historical Provider, MD  lisinopril-hydrochlorothiazide (PRINZIDE,ZESTORETIC) 20-25 MG per tablet  Take 1 tablet by mouth every morning.    Yes Historical Provider, MD  loratadine (CLARITIN) 10 MG tablet Take 10 mg by mouth every morning.    Yes Historical Provider, MD  metoprolol succinate (TOPROL-XL) 50 MG 24 hr tablet Take 50 mg by mouth daily. Take with or immediately following a meal.   Yes Historical Provider, MD  oxycodone (OXY-IR) 5 MG capsule Take 5 mg by mouth 2 (two) times daily as needed (for pain).    Yes Historical Provider, MD  pravastatin (PRAVACHOL) 40 MG tablet Take 40 mg by mouth at bedtime.    Yes Historical Provider, MD  tiZANidine (ZANAFLEX) 4 MG tablet Take 4 mg by mouth every 6 (six) hours as  needed for muscle spasms.   Yes Historical Provider, MD  topiramate (TOPAMAX) 25 MG tablet Take 75 mg by mouth 2 (two) times daily.    Yes Historical Provider, MD  Vitamin D, Ergocalciferol, (DRISDOL) 50000 UNITS CAPS capsule Take 50,000 Units by mouth every 7 (seven) days.   Yes Historical Provider, MD   BP 157/93  Pulse 68  Temp(Src) 97.5 F (36.4 C) (Oral)  Resp 16  Ht 4\' 11"  (1.499 m)  Wt 276 lb (125.193 kg)  BMI 55.72 kg/m2  SpO2 98% Physical Exam  Nursing note and vitals reviewed. Constitutional: She is oriented to person, place, and time. She appears well-developed and well-nourished.  HENT:  Head: Normocephalic and atraumatic.  Eyes: Conjunctivae are normal. Pupils are equal, round, and reactive to light.  Neck: Neck supple. No tracheal deviation present. No thyromegaly present.  Cardiovascular: Regular rhythm.   No murmur heard. Mild Tachycardic  Pulmonary/Chest: Effort normal and breath sounds normal.  Abdominal: Soft. Bowel sounds are normal. She exhibits no distension. There is no tenderness.  Morbidly obese  Musculoskeletal: Normal range of motion. She exhibits no edema and no tenderness.  Neurological: She is alert and oriented to person, place, and time. She has normal reflexes. Coordination normal.  DTRs symmetric bilaterally at knee jerk ankle jerk and biceps was ordered bilaterally. Motor strength 5 over 5 overall gait is normal. Patient becomes lightheaded upon standing and complains of mild shortness of breath  Skin: Skin is warm and dry. No rash noted.  Psychiatric: She has a normal mood and affect.    ED Course  Procedures (including critical care time) Labs Review Labs Reviewed  CBC - Abnormal; Notable for the following:    WBC 11.6 (*)    Hemoglobin 15.1 (*)    All other components within normal limits  BASIC METABOLIC PANEL - Abnormal; Notable for the following:    Sodium 136 (*)    Glucose, Bld 125 (*)    Creatinine, Ser 1.12 (*)    GFR calc non  Af Amer 55 (*)    GFR calc Af Amer 63 (*)    All other components within normal limits  I-STAT TROPOININ, ED    Imaging Review No results found.   EKG Interpretation   Date/Time:  Monday January 23 2014 15:18:51 EDT Ventricular Rate:  113 PR Interval:  148 QRS Duration: 82 QT Interval:  342 QTC Calculation: 469 R Axis:   11 Text Interpretation:  Sinus tachycardia Otherwise normal ECG SINCE LAST  TRACING HEART RATE HAS INCREASED Confirmed by Winfred Leeds  MD, Kristina Mcnorton [phone])  on 01/23/2014 9:29:01 PM       Results for orders placed during the hospital encounter of 01/23/14  CBC      Result Value Ref Range  WBC 11.6 (*) 4.0 - 10.5 K/uL   RBC 5.09  3.87 - 5.11 MIL/uL   Hemoglobin 15.1 (*) 12.0 - 15.0 g/dL   HCT 44.1  36.0 - 46.0 %   MCV 86.6  78.0 - 100.0 fL   MCH 29.7  26.0 - 34.0 pg   MCHC 34.2  30.0 - 36.0 g/dL   RDW 15.0  11.5 - 15.5 %   Platelets 323  150 - 400 K/uL  BASIC METABOLIC PANEL      Result Value Ref Range   Sodium 136 (*) 137 - 147 mEq/L   Potassium 3.8  3.7 - 5.3 mEq/L   Chloride 96  96 - 112 mEq/L   CO2 26  19 - 32 mEq/L   Glucose, Bld 125 (*) 70 - 99 mg/dL   BUN 15  6 - 23 mg/dL   Creatinine, Ser 1.12 (*) 0.50 - 1.10 mg/dL   Calcium 9.3  8.4 - 10.5 mg/dL   GFR calc non Af Amer 55 (*) >90 mL/min   GFR calc Af Amer 63 (*) >90 mL/min  D-DIMER, QUANTITATIVE      Result Value Ref Range   D-Dimer, Quant 0.46  0.00 - 0.48 ug/mL-FEU  I-STAT TROPOININ, ED      Result Value Ref Range   Troponin i, poc 0.01  0.00 - 0.08 ng/mL   Comment 3           chest xray viewed by me Dg Chest 2 View  01/23/2014   CLINICAL DATA:  Dizziness. Loss of consciousness. Status post fall. Shortness of breath and diaphoresis.  EXAM: CHEST  2 VIEW  COMPARISON:  Chest radiograph performed 01/01/2013  FINDINGS: The lungs are well-aerated and clear. There is no evidence of focal opacification, pleural effusion or pneumothorax.  The heart is normal in size; the mediastinal contour is within  normal limits. No acute osseous abnormalities are seen.  IMPRESSION: No acute cardiopulmonary process seen; no displaced rib fractures identified.   Electronically Signed   By: Garald Balding M.D.   On: 01/23/2014 22:21    10:50 PM after treatment with intravenous fluids, 1 L normal saline patient is asymptomatic. She is no longer lightheaded on standing. No shortness of breath. Feels normal. She is alert and ambulatory on discharge. MDM  Patient exhibiting orthostasis on exam. Likely the cause of her syncope. She is encouraged to drink adequate amounts. Followup with her primary care physician if she continues to have symptoms in a few days. Final diagnoses:  None   Diagnosis #1 syncope      Orlie Dakin, MD 01/23/14 2300

## 2014-02-08 ENCOUNTER — Emergency Department (HOSPITAL_COMMUNITY)
Admission: EM | Admit: 2014-02-08 | Discharge: 2014-02-09 | Disposition: A | Discharge status: Home / Self Care | Payer: Medicaid Other | Attending: Emergency Medicine | Admitting: Emergency Medicine

## 2014-02-08 ENCOUNTER — Encounter (HOSPITAL_COMMUNITY): Payer: Self-pay | Admitting: Emergency Medicine

## 2014-02-08 DIAGNOSIS — Z8719 Personal history of other diseases of the digestive system: Secondary | ICD-10-CM | POA: Insufficient documentation

## 2014-02-08 DIAGNOSIS — IMO0002 Reserved for concepts with insufficient information to code with codable children: Secondary | ICD-10-CM | POA: Insufficient documentation

## 2014-02-08 DIAGNOSIS — Z8673 Personal history of transient ischemic attack (TIA), and cerebral infarction without residual deficits: Secondary | ICD-10-CM | POA: Insufficient documentation

## 2014-02-08 DIAGNOSIS — G8929 Other chronic pain: Secondary | ICD-10-CM

## 2014-02-08 DIAGNOSIS — E7800 Pure hypercholesterolemia, unspecified: Secondary | ICD-10-CM | POA: Insufficient documentation

## 2014-02-08 DIAGNOSIS — Z79899 Other long term (current) drug therapy: Secondary | ICD-10-CM | POA: Insufficient documentation

## 2014-02-08 DIAGNOSIS — M549 Dorsalgia, unspecified: Secondary | ICD-10-CM | POA: Insufficient documentation

## 2014-02-08 DIAGNOSIS — G43909 Migraine, unspecified, not intractable, without status migrainosus: Secondary | ICD-10-CM | POA: Insufficient documentation

## 2014-02-08 DIAGNOSIS — I10 Essential (primary) hypertension: Secondary | ICD-10-CM | POA: Insufficient documentation

## 2014-02-08 DIAGNOSIS — Z8739 Personal history of other diseases of the musculoskeletal system and connective tissue: Secondary | ICD-10-CM | POA: Insufficient documentation

## 2014-02-08 NOTE — ED Notes (Signed)
Pt from home. Has chronic back pain and bulging discs. . States receives shots and pain meds , but neither is helping with back pain. Has an appt with neurologist 02/23/14, but can't wait til then. Reports pain is 10/10. Ambulated to room without difficulty or assistance.

## 2014-02-09 MED ORDER — HYDROMORPHONE HCL PF 1 MG/ML IJ SOLN
2.0000 mg | Freq: Once | INTRAMUSCULAR | Status: AC
  Administered 2014-02-09: 2 mg via INTRAMUSCULAR
  Filled 2014-02-09: qty 2

## 2014-02-09 NOTE — ED Provider Notes (Signed)
Medical screening examination/treatment/procedure(s) were performed by non-physician practitioner and as supervising physician I was immediately available for consultation/collaboration.   EKG Interpretation None       Kalman Drape, MD 02/09/14 [phone]

## 2014-02-09 NOTE — Discharge Instructions (Signed)
Chronic Back Pain   When back pain lasts longer than 3 months, it is called chronic back pain.People with chronic back pain often go through certain periods that are more intense (flare-ups).   CAUSES  Chronic back pain can be caused by wear and tear (degeneration) on different structures in your back. These structures include:   The bones of your spine (vertebrae) and the joints surrounding your spinal cord and nerve roots (facets).   The strong, fibrous tissues that connect your vertebrae (ligaments).  Degeneration of these structures may result in pressure on your nerves. This can lead to constant pain.  HOME CARE INSTRUCTIONS   Avoid bending, heavy lifting, prolonged sitting, and activities which make the problem worse.   Take brief periods of rest throughout the day to reduce your pain. Lying down or standing usually is better than sitting while you are resting.   Take over-the-counter or prescription medicines only as directed by your caregiver.  SEEK IMMEDIATE MEDICAL CARE IF:    You have weakness or numbness in one of your legs or feet.   You have trouble controlling your bladder or bowels.   You have nausea, vomiting, abdominal pain, shortness of breath, or fainting.  Document Released: 10/23/2004 Document Revised: 12/08/2011 Document Reviewed: 08/30/2011  [employer] Patient Information 2014 [employer]

## 2014-02-09 NOTE — ED Provider Notes (Signed)
CSN: [id number]     Arrival date & time 02/08/14  2307 History   First MD Initiated Contact with Patient 02/09/14 0022     Chief Complaint  Patient presents with  . Back Pain     (Consider location/radiation/quality/duration/timing/severity/associated sxs/prior Treatment) HPI  She presents to the emergency department for treatment of her back pain. She has chronic back pain with bulging discs and normally takes Cymbalta and Percocet at home which usually helps relieve her pain. However she reports 1-3 times a month her pain gets too severe and it requires her to take her Zanaflex and other medications. Typically those work but every once in a while these do not work and she has to come to the emergency department for help with her pain. She reports that she came within the past month and received a shot which helped break the cycle. She says that this pain exacerbation is no different than any pain exacerbation she's had before. She has not had falls, does not have any weakness to her lower extremities, has not had any headache, nausea, vomiting, diarrhea, weakness.  Past Medical History  Diagnosis Date  . Hypertension   . Hypercholesteremia   . Lower back pain   . Nontoxic uninodular goiter     sees dr vollmer at [facility]  . Calculus of gallbladder without mention of cholecystitis or obstruction   . Arthritis   . Stroke 2000  . Migraine   . Sleep apnea     STOPBANG=5  . Fibromyalgia    Past Surgical History  Procedure Laterality Date  . Knee arthroscopy  one 1995 and 1 in 1997    both knees done  . Abdominal hysterectomy    . Cesarean section  yrs ago    done x 2  . Surgery for endometriosis  yrs ago  . Thryoid biopsy  December 01, 2011     [facility]  . Cholecystectomy  01/05/2012    Procedure: LAPAROSCOPIC CHOLECYSTECTOMY WITH INTRAOPERATIVE CHOLANGIOGRAM;  Surgeon: Pedro Earls, MD;  Location: [facility];  Service: General;  Laterality: N/A;  . Colonoscopy  10/08/2012   Procedure: COLONOSCOPY;  Surgeon: Beryle Beams, MD;  Location: [facility];  Service: Endoscopy;  Laterality: N/A;   Family History  Problem Relation Age of Onset  . Cancer Brother     colon and lung  . Cancer Maternal Grandmother     colon   History  Substance Use Topics  . Smoking status: Never Smoker   . Smokeless tobacco: Never Used  . Alcohol Use: No   OB History   Grav Para Term Preterm Abortions TAB SAB Ect Mult Living                 Review of Systems  Musculoskeletal: Positive for back pain.  All other systems reviewed and are negative.     Allergies  Shrimp and Naproxen  Home Medications   Prior to Admission medications   Medication Sig Start Date End Date Taking? Authorizing Provider  amLODipine (NORVASC) 10 MG tablet Take 10 mg by mouth daily.   Yes Historical Provider, MD  cyclobenzaprine (FLEXERIL) 10 MG tablet Take 10 mg by mouth 3 (three) times daily as needed for muscle spasms.   Yes Historical Provider, MD  DULoxetine (CYMBALTA) 60 MG capsule Take 60 mg by mouth daily.   Yes Historical Provider, MD  fluticasone (FLONASE) 50 MCG/ACT nasal spray Place 2 sprays into both nostrils daily.   Yes Historical Provider, MD  lisinopril-hydrochlorothiazide (PRINZIDE,ZESTORETIC) 20-25 MG per tablet Take 1 tablet by mouth every morning.    Yes Historical Provider, MD  loratadine (CLARITIN) 10 MG tablet Take 10 mg by mouth every morning.    Yes Historical Provider, MD  metoprolol succinate (TOPROL-XL) 50 MG 24 hr tablet Take 50 mg by mouth daily. Take with or immediately following a meal.   Yes Historical Provider, MD  oxyCODONE (OXY IR/ROXICODONE) 5 MG immediate release tablet Take 5 mg by mouth 2 (two) times daily as needed for severe pain.   Yes Historical Provider, MD  pravastatin (PRAVACHOL) 40 MG tablet Take 40 mg by mouth at bedtime.    Yes Historical Provider, MD  tiZANidine (ZANAFLEX) 4 MG tablet Take 4 mg by mouth every 6 (six) hours as needed for muscle  spasms.   Yes Historical Provider, MD  topiramate (TOPAMAX) 25 MG tablet Take 75 mg by mouth 2 (two) times daily.    Yes Historical Provider, MD  Vitamin D, Ergocalciferol, (DRISDOL) 50000 UNITS CAPS capsule Take 50,000 Units by mouth every 7 (seven) days.   Yes Historical Provider, MD   BP 148/97  Pulse 110  Temp(Src) 98.7 F (37.1 C) (Oral)  Resp 20  Ht 4\' 11"  (1.499 m)  SpO2 100% Physical Exam  Nursing note and vitals reviewed. Constitutional: She appears well-developed and well-nourished. No distress.  HENT:  Head: Normocephalic and atraumatic.  Eyes: Pupils are equal, round, and reactive to light.  Neck: Normal range of motion. Neck supple.  Cardiovascular: Normal rate and regular rhythm.   Pulmonary/Chest: Effort normal.  Abdominal: Soft.  Musculoskeletal:  Pt has equal strength to bilateral lower extremities.  Neurosensory function adequate to both legs Skin color is normal. Skin is warm and moist.  I see no step off deformity, no midline bony tenderness.  Pt is able to ambulate.  No crepitus, laceration, effusion, induration, lesions, swelling.   Pedal pulses are symmetrical and palpable bilaterally  tenderness to palpation of lumbar paraspinel muscles   Neurological: She is alert.  Skin: Skin is warm and dry.    ED Course  Procedures (including critical care time) Labs Review Labs Reviewed - No data to display  Imaging Review No results found.   EKG Interpretation None      MDM   Final diagnoses:  Chronic back pain    Pt given IM injection of pain medications, no prescriptions given and patient understood.  53 y.o.Jodi Bowman's  with back pain. No neurological deficits and normal neuro exam. Patient can walk but states is painful. No loss of bowel or bladder control. No concern for cauda equina. No fever, night sweats, weight loss, h/o cancer, IVDU. RICE protocol and pain medicine indicated and discussed with patient.   Patient Plan 1.  Medications: usual home medications  2. Treatment: rest, drink plenty of fluids, gentle stretching as discussed, alternate ice and heat  3. Follow Up: Please followup with your primary doctor for discussion of your diagnoses and further evaluation after today's visit; if you do not have a primary care doctor use the resource guide provided to find one   Vital signs are stable at discharge. Filed Vitals:   02/08/14 2318  BP: 148/97  Pulse: 110  Temp: 98.7 F (37.1 C)  Resp: 20    Patient/guardian has voiced understanding and agreed to follow-up with the PCP or specialist.         Linus Mako, PA-C 02/09/14 [phone]

## 2014-02-09 NOTE — ED Notes (Signed)
PA at bedside.

## 2014-02-27 ENCOUNTER — Emergency Department (HOSPITAL_COMMUNITY): Payer: Medicaid Other

## 2014-02-27 ENCOUNTER — Encounter (HOSPITAL_COMMUNITY): Payer: Self-pay | Admitting: Emergency Medicine

## 2014-02-27 ENCOUNTER — Emergency Department (HOSPITAL_COMMUNITY)
Admission: EM | Admit: 2014-02-27 | Discharge: 2014-02-28 | Disposition: A | Discharge status: Home / Self Care | Payer: Medicaid Other | Attending: Emergency Medicine | Admitting: Emergency Medicine

## 2014-02-27 DIAGNOSIS — M545 Low back pain, unspecified: Secondary | ICD-10-CM | POA: Insufficient documentation

## 2014-02-27 DIAGNOSIS — M25579 Pain in unspecified ankle and joints of unspecified foot: Secondary | ICD-10-CM | POA: Insufficient documentation

## 2014-02-27 DIAGNOSIS — M25572 Pain in left ankle and joints of left foot: Secondary | ICD-10-CM

## 2014-02-27 DIAGNOSIS — G43909 Migraine, unspecified, not intractable, without status migrainosus: Secondary | ICD-10-CM | POA: Insufficient documentation

## 2014-02-27 DIAGNOSIS — IMO0002 Reserved for concepts with insufficient information to code with codable children: Secondary | ICD-10-CM | POA: Insufficient documentation

## 2014-02-27 DIAGNOSIS — E7800 Pure hypercholesterolemia, unspecified: Secondary | ICD-10-CM | POA: Insufficient documentation

## 2014-02-27 DIAGNOSIS — I10 Essential (primary) hypertension: Secondary | ICD-10-CM | POA: Insufficient documentation

## 2014-02-27 DIAGNOSIS — Z8719 Personal history of other diseases of the digestive system: Secondary | ICD-10-CM | POA: Insufficient documentation

## 2014-02-27 DIAGNOSIS — M129 Arthropathy, unspecified: Secondary | ICD-10-CM | POA: Insufficient documentation

## 2014-02-27 DIAGNOSIS — Z8673 Personal history of transient ischemic attack (TIA), and cerebral infarction without residual deficits: Secondary | ICD-10-CM | POA: Insufficient documentation

## 2014-02-27 DIAGNOSIS — Z79899 Other long term (current) drug therapy: Secondary | ICD-10-CM | POA: Insufficient documentation

## 2014-02-27 DIAGNOSIS — IMO0001 Reserved for inherently not codable concepts without codable children: Secondary | ICD-10-CM | POA: Insufficient documentation

## 2014-02-27 DIAGNOSIS — G4730 Sleep apnea, unspecified: Secondary | ICD-10-CM | POA: Insufficient documentation

## 2014-02-27 LAB — D-DIMER, QUANTITATIVE: D-Dimer, Quant: 0.48 ug/mL-FEU (ref 0.00–0.48)

## 2014-02-27 NOTE — ED Provider Notes (Signed)
CSN: [id number]     Arrival date & time 02/27/14  2133 History  This chart was scribed for a non-physician practitioner, Jamse Mead, working with Veryl Speak, MD by [place] Peace, ED Scribe. The patient was seen in TR05C/TR05C. The patient's care was started at 10:55 PM.   Chief Complaint  Patient presents with  . Ankle Pain    The history is provided by the patient. No language interpreter was used.   HPI Comments: Jodi Bowman is a 53 y.o. female who presents to the Emergency Department complaining of intermittent, medial left ankle and left lower calf pain with associated swelling onset 1 month ago. This current episode started 4 days ago on Thursday. She describes pain as both throbbing tight. She states that sometimes pain area is warm to touch. She further states that the pain is exacerbated by ambulation. She has been soaking her ankle in Epsom salts. Patient denies redness to ankle. She has previous history of ankle injury about 15 years ago. She is not having fever, chills, SOB, difficulty breathing, numbness or weakness of the left lower extremity. Her PCP is Mechele Dawley and she tried to see him today, but she never got a call back from his office about scheduling an appointment.    Past Medical History  Diagnosis Date  . Hypertension   . Hypercholesteremia   . Lower back pain   . Nontoxic uninodular goiter     sees dr vollmer at [facility]  . Calculus of gallbladder without mention of cholecystitis or obstruction   . Arthritis   . Stroke 2000  . Migraine   . Sleep apnea     STOPBANG=5  . Fibromyalgia    Past Surgical History  Procedure Laterality Date  . Knee arthroscopy  one 1995 and 1 in 1997    both knees done  . Abdominal hysterectomy    . Cesarean section  yrs ago    done x 2  . Surgery for endometriosis  yrs ago  . Thryoid biopsy  December 01, 2011     at [facility]  . Cholecystectomy  01/05/2012    Procedure: LAPAROSCOPIC CHOLECYSTECTOMY WITH INTRAOPERATIVE  CHOLANGIOGRAM;  Surgeon: Pedro Earls, MD;  Location: [facility];  Service: General;  Laterality: N/A;  . Colonoscopy  10/08/2012    Procedure: COLONOSCOPY;  Surgeon: Beryle Beams, MD;  Location: [facility];  Service: Endoscopy;  Laterality: N/A;   Family History  Problem Relation Age of Onset  . Cancer Brother     colon and lung  . Cancer Maternal Grandmother     colon   History  Substance Use Topics  . Smoking status: Never Smoker   . Smokeless tobacco: Never Used  . Alcohol Use: No   OB History   Grav Para Term Preterm Abortions TAB SAB Ect Mult Living                 Review of Systems  Constitutional: Negative for fever and chills.  Respiratory: Negative for shortness of breath.   Musculoskeletal: Positive for arthralgias (left ankle pain).  Neurological: Negative for weakness and numbness.      Allergies  Shrimp and Naproxen  Home Medications   Prior to Admission medications   Medication Sig Start Date End Date Taking? Authorizing Provider  amLODipine (NORVASC) 10 MG tablet Take 10 mg by mouth daily.    Historical Provider, MD  cyclobenzaprine (FLEXERIL) 10 MG tablet Take 10 mg by mouth 3 (three) times daily as needed  for muscle spasms.    Historical Provider, MD  DULoxetine (CYMBALTA) 60 MG capsule Take 60 mg by mouth daily.    Historical Provider, MD  fluticasone (FLONASE) 50 MCG/ACT nasal spray Place 2 sprays into both nostrils daily.    Historical Provider, MD  lisinopril-hydrochlorothiazide (PRINZIDE,ZESTORETIC) 20-25 MG per tablet Take 1 tablet by mouth every morning.     Historical Provider, MD  loratadine (CLARITIN) 10 MG tablet Take 10 mg by mouth every morning.     Historical Provider, MD  metoprolol succinate (TOPROL-XL) 50 MG 24 hr tablet Take 50 mg by mouth daily. Take with or immediately following a meal.    Historical Provider, MD  oxyCODONE (OXY IR/ROXICODONE) 5 MG immediate release tablet Take 5 mg by mouth 2 (two) times daily as needed for severe  pain.    Historical Provider, MD  pravastatin (PRAVACHOL) 40 MG tablet Take 40 mg by mouth at bedtime.     Historical Provider, MD  tiZANidine (ZANAFLEX) 4 MG tablet Take 4 mg by mouth every 6 (six) hours as needed for muscle spasms.    Historical Provider, MD  topiramate (TOPAMAX) 25 MG tablet Take 75 mg by mouth 2 (two) times daily.     Historical Provider, MD  Vitamin D, Ergocalciferol, (DRISDOL) 50000 UNITS CAPS capsule Take 50,000 Units by mouth every 7 (seven) days.    Historical Provider, MD   Triage Vitals: BP 160/101  Pulse 100  Temp(Src) 98.2 F (36.8 C) (Oral)  Resp 16  Ht 4\' 11"  (1.499 m)  Wt 282 lb 4 oz (128.028 kg)  BMI 56.98 kg/m2  SpO2 100% Physical Exam  Nursing note and vitals reviewed. Constitutional: She is oriented to person, place, and time. She appears well-developed and well-nourished. No distress.  HENT:  Head: Normocephalic and atraumatic.  Eyes: Conjunctivae and EOM are normal. Right eye exhibits no discharge. Left eye exhibits no discharge.  Neck: Normal range of motion. Neck supple. No tracheal deviation present.  Cardiovascular: Normal rate, regular rhythm and normal heart sounds.  Exam reveals no friction rub.   No murmur heard. Pulses:      Radial pulses are 2+ on the right side, and 2+ on the left side.       Dorsalis pedis pulses are 2+ on the right side, and 2+ on the left side.       Posterior tibial pulses are 2+ on the right side, and 2+ on the left side.  Pulmonary/Chest: Effort normal and breath sounds normal. No respiratory distress. She has no wheezes. She has no rales.  Musculoskeletal: She exhibits edema and tenderness.  Mild swelling identified to the left ankle-medial aspect. Negative ear pain, inflammation, lesions, sores, ecchymosis. Negative warmth upon palpation. Negative red streaks noted. Full range of motion to the left ankle without difficulty-full inversion, eversion, dorsiflexion, plantar flexion. Patient is able to wiggle toes  without difficulty. Full range of motion to remaining left lower extremity.  Lymphadenopathy:    She has no cervical adenopathy.  Neurological: She is alert and oriented to person, place, and time. No cranial nerve deficit. She exhibits normal muscle tone. Coordination normal.  Cranial nerves III-XII grossly intact Strength 5+/5+ to lower extremities bilaterally with resistance applied, equal distribution noted Strength intact to the digits of the left foot Sensation intact with differentiation sharp and dull touch Gait proper, proper balance - negative sway, negative drift, negative step-offs  Skin: Skin is warm and dry. No rash noted. She is not diaphoretic. No erythema.  Psychiatric: She has a normal mood and affect. Her behavior is normal. Thought content normal.    ED Course  Procedures (including critical care time) DIAGNOSTIC STUDIES: Oxygen Saturation is 100% on room air, normal by my interpretation.    COORDINATION OF CARE: 10:59 PM- Treatment plan was discussed with patient who verbalizes understanding and agrees.   11:09 PM Attending physician in assessing patient, Dr. Everlene Balls. As per attending physician recommended d-dimer to be ordered if negative patient can be discharged home if positive patient is to report back to the ED tomorrow for Doppler to be performed of the left lower extremity.   Results for orders placed during the hospital encounter of 02/27/14  D-DIMER, QUANTITATIVE      Result Value Ref Range   D-Dimer, Quant 0.48  0.00 - 0.48 ug/mL-FEU    Labs Review Labs Reviewed  D-DIMER, QUANTITATIVE    Imaging Review Dg Ankle Complete Left  02/27/2014   CLINICAL DATA:  Ankle pain.  No history of trauma.  EXAM: LEFT ANKLE COMPLETE - 3+ VIEW  COMPARISON:  None currently available  FINDINGS: Mild periarticular soft tissue swelling. There is no acute fracture, osseous erosion, or joint malalignment. No significant degenerative joint narrowing. No evidence of joint  effusion.  IMPRESSION: No acute osseous findings.   Electronically Signed   By: Jorje Guild M.D.   On: 02/27/2014 22:28     EKG Interpretation None      MDM   Final diagnoses:  Left ankle pain    Filed Vitals:   02/27/14 2143  BP: 160/101  Pulse: 100  Temp: 98.2 F (36.8 C)  TempSrc: Oral  Resp: 16  Height: 4\' 11"  (1.499 m)  Weight: 282 lb 4 oz (128.028 kg)  SpO2: 100%    I personally performed the services described in this documentation, which was scribed in my presence. The recorded information has been reviewed and is accurate.  Plain film of left ankle negative for acute osseous injury-mild periarticular soft tissue swelling noted. There is no acute fracture, assess erosion or joint malalignment with no significant degenerative joint narrowing. No evidence of joint effusion identified. Patient seen and assessed by attending physician, Dr. Everlene Balls who recommended patient to get d-dimer performed. D-dimer 0.48 - based on age related d-dimer, d-dimer negative. Discussed case with attending physician who continued to recommend that patient come back to the ED to doppler of LLE tomorrow. Doubt septic joint. Doubt DVT. Doubt gout. Doubt compartment syndrome. Doubt ischemia. Suspicion to be swelling secondary to unknown etiology. Patient stable, afebrile. Patient not septic appearing. Patient placed in ASO brace for comfort. Discharged patient. Discussed with patient to rest and stay hydrated. Discussed with patient to rest, ice, and elevate. Discussed with patient to report back to the ED tomorrow for doppler of LLE to be performed - patient understood and agreed to plan. Discussed with patient to closely monitor symptoms and if symptoms are to worsen or change to report back to the ED - strict return instructions given.  Patient agreed to plan of care, understood, all questions answered.   [employer], PA-C 02/28/14 0408

## 2014-02-27 NOTE — ED Notes (Addendum)
Pt reports left ankle pain that radiates to interior aspect left lower leg x 1 month. Denies any recent injury. Denies numbness/tingling. No warmth or redness noted to area. Pt is ambulatory to triage, but states it hurts with ambulation. Pulses intact. NAD. Denies CP, SOB or recent travel.

## 2014-02-27 NOTE — ED Notes (Signed)
PT denies any injury to ankle and pt reports swelling does not change when ankle is elevated.

## 2014-02-28 ENCOUNTER — Ambulatory Visit (HOSPITAL_COMMUNITY): Payer: Medicaid Other | Attending: Family Medicine

## 2014-02-28 NOTE — Progress Notes (Signed)
Orthopedic Tech Progress Note Patient Details:  Jodi Bowman [birth year] [id number]  Ortho Devices Type of Ortho Device: ASO   Katheren Shams 02/28/2014, 12:37 AM

## 2014-02-28 NOTE — Discharge Instructions (Signed)
Please report back to the ED tomorrow for a Doppler to be performed-the order has already been placed Please call your doctor for a followup appointment within 24-48 hours. When you talk to your doctor please let them know that you were seen in the emergency department and have them acquire all of your records so that they can discuss the findings with you and formulate a treatment plan to fully care for your new and ongoing problems. Please call and set-up an appointment with your primary care provider to be seen and re-assessed Please rest and stay hydrated Please call and set-up an appointment with the orthopedics Please avoid any physical or strenuous activity Please continue to keep ankle in brace at all times Please rest, ice, elevate - ice at least 4-5 times per day at 20 minute intervals Please continue to monitor symptoms closely and if symptoms are to worsen or change (fever greater than 101, chills, sweating, nausea, vomiting, chest pain, shortness of breath, difficulty breathing, numbness, tingling, worsening or changes to pain, swelling, to the touch, red streaks, fall, injury) please report back to the ED immediately   Arthralgia Your caregiver has diagnosed you as suffering from an arthralgia. Arthralgia means there is pain in a joint. This can come from many reasons including:  Bruising the joint which causes soreness (inflammation) in the joint.  Wear and tear on the joints which occur as we grow older (osteoarthritis).  Overusing the joint.  Various forms of arthritis.  Infections of the joint. Regardless of the cause of pain in your joint, most of these different pains respond to anti-inflammatory drugs and rest. The exception to this is when a joint is infected, and these cases are treated with antibiotics, if it is a bacterial infection. HOME CARE INSTRUCTIONS   Rest the injured area for as long as directed by your caregiver. Then slowly start using the joint as  directed by your caregiver and as the pain allows. Crutches as directed may be useful if the ankles, knees or hips are involved. If the knee was splinted or casted, continue use and care as directed. If an stretchy or elastic wrapping bandage has been applied today, it should be removed and re-applied every 3 to 4 hours. It should not be applied tightly, but firmly enough to keep swelling down. Watch toes and feet for swelling, bluish discoloration, coldness, numbness or excessive pain. If any of these problems (symptoms) occur, remove the ace bandage and re-apply more loosely. If these symptoms persist, contact your caregiver or return to this location.  For the first 24 hours, keep the injured extremity elevated on pillows while lying down.  Apply ice for 15-20 minutes to the sore joint every couple hours while awake for the first half day. Then 03-04 times per day for the first 48 hours. Put the ice in a plastic bag and place a towel between the bag of ice and your skin.  Wear any splinting, casting, elastic bandage applications, or slings as instructed.  Only take over-the-counter or prescription medicines for pain, discomfort, or fever as directed by your caregiver. Do not use aspirin immediately after the injury unless instructed by your physician. Aspirin can cause increased bleeding and bruising of the tissues.  If you were given crutches, continue to use them as instructed and do not resume weight bearing on the sore joint until instructed. Persistent pain and inability to use the sore joint as directed for more than 2 to 3 days are warning signs  indicating that you should see a caregiver for a follow-up visit as soon as possible. Initially, a hairline fracture (break in bone) may not be evident on X-rays. Persistent pain and swelling indicate that further evaluation, non-weight bearing or use of the joint (use of crutches or slings as instructed), or further X-rays are indicated. X-rays may  sometimes not show a small fracture until a week or 10 days later. Make a follow-up appointment with your own caregiver or one to whom we have referred you. A radiologist (specialist in reading X-rays) may read your X-rays. Make sure you know how you are to obtain your X-ray results. Do not assume everything is normal if you do not hear from [place]. SEEK MEDICAL CARE IF: Bruising, swelling, or pain increases. SEEK IMMEDIATE MEDICAL CARE IF:   Your fingers or toes are numb or blue.  The pain is not responding to medications and continues to stay the same or get worse.  The pain in your joint becomes severe.  You develop a fever over 102 F (38.9 C).  It becomes impossible to move or use the joint. MAKE SURE YOU:   Understand these instructions.  Will watch your condition.  Will get help right away if you are not doing well or get worse. Document Released: 09/15/2005 Document Revised: 12/08/2011 Document Reviewed: 05/03/2008 [facility] Patient Information 2014 [place].

## 2014-02-28 NOTE — ED Notes (Signed)
Ortho contacted for placement of ASO brace

## 2014-02-28 NOTE — ED Provider Notes (Signed)
Medical screening examination/treatment/procedure(s) were performed by non-physician practitioner and as supervising physician I was immediately available for consultation/collaboration.     Veryl Speak, MD 02/28/14 [phone]

## 2014-03-03 ENCOUNTER — Encounter (HOSPITAL_COMMUNITY): Payer: Self-pay | Admitting: Emergency Medicine

## 2014-03-03 ENCOUNTER — Emergency Department (HOSPITAL_COMMUNITY)
Admission: EM | Admit: 2014-03-03 | Discharge: 2014-03-03 | Disposition: A | Discharge status: Home / Self Care | Payer: Medicaid Other | Attending: Emergency Medicine | Admitting: Emergency Medicine

## 2014-03-03 DIAGNOSIS — IMO0002 Reserved for concepts with insufficient information to code with codable children: Secondary | ICD-10-CM | POA: Insufficient documentation

## 2014-03-03 DIAGNOSIS — M545 Low back pain, unspecified: Secondary | ICD-10-CM | POA: Insufficient documentation

## 2014-03-03 DIAGNOSIS — M546 Pain in thoracic spine: Secondary | ICD-10-CM

## 2014-03-03 DIAGNOSIS — I10 Essential (primary) hypertension: Secondary | ICD-10-CM | POA: Insufficient documentation

## 2014-03-03 DIAGNOSIS — G4730 Sleep apnea, unspecified: Secondary | ICD-10-CM | POA: Insufficient documentation

## 2014-03-03 DIAGNOSIS — Z8739 Personal history of other diseases of the musculoskeletal system and connective tissue: Secondary | ICD-10-CM | POA: Insufficient documentation

## 2014-03-03 DIAGNOSIS — Z87442 Personal history of urinary calculi: Secondary | ICD-10-CM | POA: Insufficient documentation

## 2014-03-03 DIAGNOSIS — Z8673 Personal history of transient ischemic attack (TIA), and cerebral infarction without residual deficits: Secondary | ICD-10-CM | POA: Insufficient documentation

## 2014-03-03 DIAGNOSIS — Z79899 Other long term (current) drug therapy: Secondary | ICD-10-CM | POA: Insufficient documentation

## 2014-03-03 DIAGNOSIS — G43909 Migraine, unspecified, not intractable, without status migrainosus: Secondary | ICD-10-CM | POA: Insufficient documentation

## 2014-03-03 DIAGNOSIS — E7800 Pure hypercholesterolemia, unspecified: Secondary | ICD-10-CM | POA: Insufficient documentation

## 2014-03-03 MED ORDER — CYCLOBENZAPRINE HCL 10 MG PO TABS: 10.0000 mg | ORAL_TABLET | Freq: Three times a day (TID) | ORAL | Status: DC | PRN

## 2014-03-03 MED ORDER — PREDNISONE 50 MG PO TABS: 50.0000 mg | ORAL_TABLET | Freq: Every day | ORAL | Status: DC

## 2014-03-03 MED ORDER — MORPHINE SULFATE 4 MG/ML IJ SOLN
6.0000 mg | Freq: Once | INTRAMUSCULAR | Status: AC
  Administered 2014-03-03: 6 mg via INTRAMUSCULAR
  Filled 2014-03-03: qty 2

## 2014-03-03 MED ORDER — PREDNISONE 20 MG PO TABS
60.0000 mg | ORAL_TABLET | Freq: Once | ORAL | Status: AC
  Administered 2014-03-03: 60 mg via ORAL
  Filled 2014-03-03: qty 3

## 2014-03-03 MED ORDER — HYDROCODONE-ACETAMINOPHEN 5-325 MG PO TABS: 1.0000 | ORAL_TABLET | Freq: Four times a day (QID) | ORAL | Status: DC | PRN

## 2014-03-03 MED ORDER — KETOROLAC TROMETHAMINE 60 MG/2ML IM SOLN
60.0000 mg | Freq: Once | INTRAMUSCULAR | Status: AC
  Administered 2014-03-03: 60 mg via INTRAMUSCULAR
  Filled 2014-03-03: qty 2

## 2014-03-03 NOTE — ED Notes (Signed)
Bed: SH70 Expected date:  Expected time:  Means of arrival:  Comments: Pt from [facility]

## 2014-03-03 NOTE — Discharge Instructions (Signed)
Return here as needed. Follow up with your doctor for a recheck. Ice and heat on your back

## 2014-03-03 NOTE — ED Notes (Signed)
Pt c/o upper and lower back pain radiating to hips onset yesterday, denies injury. Pt states this is chronic pain for her

## 2014-03-03 NOTE — ED Provider Notes (Signed)
CSN: [id number]     Arrival date & time 03/03/14  2155 History  This chart was scribed for Jodi Cords, PA, working with Dorie Rank, MD by Steva Colder, ED Scribe. The patient was seen in room WTR6/WTR6 at 10:34 PM.   Chief Complaint  Patient presents with  . Back Pain    The history is provided by the patient. No language interpreter was used.    HPI Comments: Jodi Bowman is a 53 y.o. female who presents to the Emergency Department complaining of one day exacerbation of her chronic back pain.  Pt localizes pain between her shoulder blades and to her bilateral lower back.  Pain is worst on the left side and radiates into hips.  Pain was present on waking yesterday.  Pt denies recent injury.  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  She has been seen by Dr. Doy Hutching in [facility] and has an appointment on 6/24.  Denies numbness, weakness, dizziness, headache, blurred vision, chest pain, shortness of breath, fever, abdominal pain, dysuria, incontinence, rash or syncope.  The patient, states, that palpation and movement make her pain, worse.  The symptoms have been persistent   Past Medical History  Diagnosis Date  . Hypertension   . Hypercholesteremia   . Lower back pain   . Nontoxic uninodular goiter     sees dr vollmer at [facility]  . Calculus of gallbladder without mention of cholecystitis or obstruction   . Arthritis   . Stroke 2000  . Migraine   . Sleep apnea     STOPBANG=5  . Fibromyalgia     Past Surgical History  Procedure Laterality Date  . Knee arthroscopy  one 1995 and 1 in 1997    both knees done  . Abdominal hysterectomy    . Cesarean section  yrs ago    done x 2  . Surgery for endometriosis  yrs ago  . Thryoid biopsy  December 01, 2011     at [facility]  . Cholecystectomy  01/05/2012    Procedure: LAPAROSCOPIC CHOLECYSTECTOMY WITH INTRAOPERATIVE CHOLANGIOGRAM;  Surgeon: Pedro Earls, MD;  Location: [facility];  Service: General;  Laterality:  N/A;  . Colonoscopy  10/08/2012    Procedure: COLONOSCOPY;  Surgeon: Beryle Beams, MD;  Location: [facility];  Service: Endoscopy;  Laterality: N/A;    Family History  Problem Relation Age of Onset  . Cancer Brother     colon and lung  . Cancer Maternal Grandmother     colon    History  Substance Use Topics  . Smoking status: Never Smoker   . Smokeless tobacco: Never Used  . Alcohol Use: No    OB History   Grav Para Term Preterm Abortions TAB SAB Ect Mult Living                   Review of Systems A complete 10 system review of systems was obtained and all systems are negative except as noted in the HPI and PMH.     Allergies  Shrimp and Naproxen  Home Medications   Prior to Admission medications   Medication Sig Start Date End Date Taking? Authorizing Provider  amLODipine (NORVASC) 10 MG tablet Take 10 mg by mouth daily.   Yes Historical Provider, MD  DULoxetine (CYMBALTA) 60 MG capsule Take 60 mg by mouth daily.   Yes Historical Provider, MD  fluticasone (FLONASE) 50 MCG/ACT nasal spray Place 2 sprays into both  nostrils daily.   Yes Historical Provider, MD  lisinopril-hydrochlorothiazide (PRINZIDE,ZESTORETIC) 20-25 MG per tablet Take 1 tablet by mouth every morning.    Yes Historical Provider, MD  loratadine (CLARITIN) 10 MG tablet Take 10 mg by mouth every morning.    Yes Historical Provider, MD  metoprolol succinate (TOPROL-XL) 50 MG 24 hr tablet Take 50 mg by mouth daily. Take with or immediately following a meal.   Yes Historical Provider, MD  oxyCODONE (OXY IR/ROXICODONE) 5 MG immediate release tablet Take 5 mg by mouth 2 (two) times daily as needed for severe pain.   Yes Historical Provider, MD  pravastatin (PRAVACHOL) 40 MG tablet Take 40 mg by mouth at bedtime.    Yes Historical Provider, MD  tiZANidine (ZANAFLEX) 4 MG tablet Take 4 mg by mouth every 6 (six) hours as needed for muscle spasms.   Yes Historical Provider, MD  topiramate (TOPAMAX) 25 MG tablet  Take 75 mg by mouth 2 (two) times daily.    Yes Historical Provider, MD  Vitamin D, Ergocalciferol, (DRISDOL) 50000 UNITS CAPS capsule Take 50,000 Units by mouth every 7 (seven) days.   Yes Historical Provider, MD   BP 126/78  Pulse 68  Temp(Src) 98.1 F (36.7 C) (Oral)  Resp 16  Ht 4\' 11"  (1.499 m)  Wt 276 lb (125.193 kg)  BMI 55.72 kg/m2  SpO2 93%  Physical Exam  Nursing note and vitals reviewed. Constitutional: She is oriented to person, place, and time. She appears well-developed and well-nourished. No distress.  HENT:  Head: Normocephalic and atraumatic.  Neck: Normal range of motion. Neck supple.  Cardiovascular: Normal rate and regular rhythm.   Pulmonary/Chest: Effort normal and breath sounds normal. No respiratory distress.  Musculoskeletal: Normal range of motion.  Paraspinal tenderness in thoracic and lumbar regions.  Neurological: She is alert and oriented to person, place, and time. She has normal reflexes. She exhibits normal muscle tone. Coordination normal.  Normal reflexes. Normal strength in all 4 extremities.  Normal sensation  Skin: Skin is warm and dry.  Psychiatric: She has a normal mood and affect. Her behavior is normal.    ED Course  Procedures (including critical care time)  DIAGNOSTIC STUDIES: Oxygen Saturation is 93% on room air, adequate by my interpretation.    COORDINATION OF CARE: 10:36 PM-Discussed treatment plan which includes pain medication with pt at bedside and pt agreed to plan.    Patient has normal reflexes and normal sensation strength in all 4 extremities.  Patient does not have any history currently of any injury.  Therefore no x-rays were obtained.  Patient is advised to followup with, who she seen in the past for her back issues.  Patient, states she has an appointment upcoming with them   Brent General, PA-C 03/04/14 [phone]

## 2014-03-04 NOTE — ED Provider Notes (Signed)
Medical screening examination/treatment/procedure(s) were performed by non-physician practitioner and as supervising physician I was immediately available for consultation/collaboration.    Dorie Rank, MD 03/04/14 [phone]

## 2014-03-12 ENCOUNTER — Encounter (HOSPITAL_COMMUNITY): Payer: Self-pay | Admitting: Emergency Medicine

## 2014-03-12 ENCOUNTER — Emergency Department (HOSPITAL_COMMUNITY): Payer: Medicaid Other

## 2014-03-12 ENCOUNTER — Observation Stay (HOSPITAL_COMMUNITY)
Admission: EM | Admit: 2014-03-12 | Discharge: 2014-03-13 | Disposition: A | Discharge status: Home / Self Care | Payer: Medicaid Other | Attending: Cardiology | Admitting: Cardiology

## 2014-03-12 DIAGNOSIS — Z8673 Personal history of transient ischemic attack (TIA), and cerebral infarction without residual deficits: Secondary | ICD-10-CM | POA: Insufficient documentation

## 2014-03-12 DIAGNOSIS — G43909 Migraine, unspecified, not intractable, without status migrainosus: Secondary | ICD-10-CM | POA: Insufficient documentation

## 2014-03-12 DIAGNOSIS — IMO0002 Reserved for concepts with insufficient information to code with codable children: Secondary | ICD-10-CM | POA: Insufficient documentation

## 2014-03-12 DIAGNOSIS — E041 Nontoxic single thyroid nodule: Secondary | ICD-10-CM | POA: Insufficient documentation

## 2014-03-12 DIAGNOSIS — R5381 Other malaise: Secondary | ICD-10-CM | POA: Insufficient documentation

## 2014-03-12 DIAGNOSIS — K8020 Calculus of gallbladder without cholecystitis without obstruction: Secondary | ICD-10-CM | POA: Insufficient documentation

## 2014-03-12 DIAGNOSIS — R5383 Other fatigue: Secondary | ICD-10-CM | POA: Insufficient documentation

## 2014-03-12 DIAGNOSIS — G4730 Sleep apnea, unspecified: Secondary | ICD-10-CM | POA: Insufficient documentation

## 2014-03-12 DIAGNOSIS — R0602 Shortness of breath: Secondary | ICD-10-CM | POA: Insufficient documentation

## 2014-03-12 DIAGNOSIS — R0789 Other chest pain: Principal | ICD-10-CM | POA: Diagnosis present

## 2014-03-12 DIAGNOSIS — E7800 Pure hypercholesterolemia, unspecified: Secondary | ICD-10-CM | POA: Insufficient documentation

## 2014-03-12 DIAGNOSIS — Z79899 Other long term (current) drug therapy: Secondary | ICD-10-CM | POA: Insufficient documentation

## 2014-03-12 DIAGNOSIS — M129 Arthropathy, unspecified: Secondary | ICD-10-CM | POA: Insufficient documentation

## 2014-03-12 DIAGNOSIS — R61 Generalized hyperhidrosis: Secondary | ICD-10-CM | POA: Insufficient documentation

## 2014-03-12 DIAGNOSIS — I10 Essential (primary) hypertension: Secondary | ICD-10-CM | POA: Insufficient documentation

## 2014-03-12 LAB — COMPREHENSIVE METABOLIC PANEL
ALT: 20 U/L (ref 0–35)
AST: 22 U/L (ref 0–37)
Albumin: 3.2 g/dL — ABNORMAL LOW (ref 3.5–5.2)
Alkaline Phosphatase: 77 U/L (ref 39–117)
BUN: 7 mg/dL (ref 6–23)
CO2: 26 mEq/L (ref 19–32)
Calcium: 9 mg/dL (ref 8.4–10.5)
Chloride: 95 mEq/L — ABNORMAL LOW (ref 96–112)
Creatinine, Ser: 0.68 mg/dL (ref 0.50–1.10)
GFR calc Af Amer: >90 mL/min (ref >90)
GFR calc non Af Amer: >90 mL/min (ref >90)
Glucose, Bld: 183 mg/dL — ABNORMAL HIGH (ref 70–99)
Potassium: 3.4 mEq/L — ABNORMAL LOW (ref 3.7–5.3)
Sodium: 136 mEq/L — ABNORMAL LOW (ref 137–147)
Total Bilirubin: 0.5 mg/dL (ref 0.3–1.2)
Total Protein: 7 g/dL (ref 6.0–8.3)

## 2014-03-12 LAB — BASIC METABOLIC PANEL
BUN: 9 mg/dL (ref 6–23)
CO2: 24 mEq/L (ref 19–32)
Calcium: 9.3 mg/dL (ref 8.4–10.5)
Chloride: 94 mEq/L — ABNORMAL LOW (ref 96–112)
Creatinine, Ser: 0.86 mg/dL (ref 0.50–1.10)
GFR calc Af Amer: 87 mL/min — ABNORMAL LOW (ref >90)
GFR calc non Af Amer: 75 mL/min — ABNORMAL LOW (ref >90)
Glucose, Bld: 124 mg/dL — ABNORMAL HIGH (ref 70–99)
Potassium: 3.6 mEq/L — ABNORMAL LOW (ref 3.7–5.3)
Sodium: 135 mEq/L — ABNORMAL LOW (ref 137–147)

## 2014-03-12 LAB — CBC WITH DIFFERENTIAL/PLATELET
Basophils Absolute: 0 10*3/uL (ref 0.0–0.1)
Basophils Relative: 0 % (ref 0–1)
Eosinophils Absolute: 0.1 10*3/uL (ref 0.0–0.7)
Eosinophils Relative: 1 % (ref 0–5)
HCT: 36.7 % (ref 36.0–46.0)
Hemoglobin: 12.2 g/dL (ref 12.0–15.0)
Lymphocytes Relative: 32 % (ref 12–46)
Lymphs Abs: 3 10*3/uL (ref 0.7–4.0)
MCH: 29.1 pg (ref 26.0–34.0)
MCHC: 33.2 g/dL (ref 30.0–36.0)
MCV: 87.6 fL (ref 78.0–100.0)
Monocytes Absolute: 0.8 10*3/uL (ref 0.1–1.0)
Monocytes Relative: 8 % (ref 3–12)
Neutro Abs: 5.5 10*3/uL (ref 1.7–7.7)
Neutrophils Relative %: 59 % (ref 43–77)
Platelets: 284 10*3/uL (ref 150–400)
RBC: 4.19 MIL/uL (ref 3.87–5.11)
RDW: 14.6 % (ref 11.5–15.5)
WBC: 9.4 10*3/uL (ref 4.0–10.5)

## 2014-03-12 LAB — D-DIMER, QUANTITATIVE (NOT AT ARMC): D-Dimer, Quant: 0.34 ug/mL-FEU (ref 0.00–0.48)

## 2014-03-12 LAB — I-STAT TROPONIN, ED: Troponin i, poc: 0.01 ng/mL (ref 0.00–0.08)

## 2014-03-12 LAB — MAGNESIUM: Magnesium: 1.5 mg/dL (ref 1.5–2.5)

## 2014-03-12 LAB — CBC
HCT: 39.6 % (ref 36.0–46.0)
Hemoglobin: 13.1 g/dL (ref 12.0–15.0)
MCH: 28.9 pg (ref 26.0–34.0)
MCHC: 33.1 g/dL (ref 30.0–36.0)
MCV: 87.4 fL (ref 78.0–100.0)
Platelets: 300 10*3/uL (ref 150–400)
RBC: 4.53 MIL/uL (ref 3.87–5.11)
RDW: 14.6 % (ref 11.5–15.5)
WBC: 11.5 10*3/uL — ABNORMAL HIGH (ref 4.0–10.5)

## 2014-03-12 LAB — TSH: TSH: 1.36 u[IU]/mL (ref 0.350–4.500)

## 2014-03-12 LAB — PROTIME-INR
INR: 1.05 (ref 0.00–1.49)
Prothrombin Time: 13.5 seconds (ref 11.6–15.2)

## 2014-03-12 LAB — TROPONIN I
Troponin I: <0.3 ng/mL (ref <0.30)
Troponin I: <0.3 ng/mL (ref <0.30)
Troponin I: <0.3 ng/mL (ref <0.30)

## 2014-03-12 LAB — GLUCOSE, CAPILLARY: Glucose-Capillary: 102 mg/dL — ABNORMAL HIGH (ref 70–99)

## 2014-03-12 MED ORDER — ATORVASTATIN CALCIUM 80 MG PO TABS
80.0000 mg | ORAL_TABLET | Freq: Every day | ORAL | Status: DC
  Administered 2014-03-12 – 2014-03-13 (×2): 80 mg via ORAL
  Filled 2014-03-12 (×2): qty 1

## 2014-03-12 MED ORDER — PANTOPRAZOLE SODIUM 40 MG PO TBEC
40.0000 mg | DELAYED_RELEASE_TABLET | Freq: Every day | ORAL | Status: DC
  Administered 2014-03-13: 40 mg via ORAL
  Filled 2014-03-12: qty 1

## 2014-03-12 MED ORDER — FLUTICASONE PROPIONATE 50 MCG/ACT NA SUSP
2.0000 | Freq: Every day | NASAL | Status: DC
  Administered 2014-03-13: 2 via NASAL
  Filled 2014-03-12: qty 16

## 2014-03-12 MED ORDER — NITROGLYCERIN 0.4 MG SL SUBL
0.4000 mg | SUBLINGUAL_TABLET | SUBLINGUAL | Status: AC | PRN
  Administered 2014-03-13 (×3): 0.4 mg via SUBLINGUAL
  Filled 2014-03-12: qty 1

## 2014-03-12 MED ORDER — TOPIRAMATE 25 MG PO TABS
75.0000 mg | ORAL_TABLET | Freq: Two times a day (BID) | ORAL | Status: DC
  Administered 2014-03-12 – 2014-03-13 (×2): 75 mg via ORAL
  Filled 2014-03-12 (×3): qty 3

## 2014-03-12 MED ORDER — ASPIRIN 300 MG RE SUPP: 300.0000 mg | RECTAL | Status: DC

## 2014-03-12 MED ORDER — HEPARIN SODIUM (PORCINE) 5000 UNIT/ML IJ SOLN
5000.0000 [IU] | Freq: Three times a day (TID) | INTRAMUSCULAR | Status: DC
  Administered 2014-03-12 – 2014-03-13 (×2): 5000 [IU] via SUBCUTANEOUS
  Filled 2014-03-12 (×5): qty 1

## 2014-03-12 MED ORDER — SODIUM CHLORIDE 0.9 % IV SOLN
INTRAVENOUS | Status: DC
Start: 2014-03-12 — End: 2014-03-13
  Administered 2014-03-12: 22:00:00 via INTRAVENOUS

## 2014-03-12 MED ORDER — NITROGLYCERIN 0.4 MG SL SUBL
0.4000 mg | SUBLINGUAL_TABLET | SUBLINGUAL | Status: DC | PRN
  Filled 2014-03-12: qty 1

## 2014-03-12 MED ORDER — VITAMIN D (ERGOCALCIFEROL) 1.25 MG (50000 UNIT) PO CAPS: 50000.0000 [IU] | ORAL_CAPSULE | ORAL | Status: DC

## 2014-03-12 MED ORDER — OXYCODONE HCL 5 MG PO TABS
10.0000 mg | ORAL_TABLET | Freq: Two times a day (BID) | ORAL | Status: DC | PRN
  Administered 2014-03-12 – 2014-03-13 (×2): 10 mg via ORAL
  Filled 2014-03-12 (×2): qty 2

## 2014-03-12 MED ORDER — NITROGLYCERIN 2 % TD OINT
0.5000 [in_us] | TOPICAL_OINTMENT | Freq: Four times a day (QID) | TRANSDERMAL | Status: DC
  Administered 2014-03-12 – 2014-03-13 (×3): 0.5 [in_us] via TOPICAL
  Filled 2014-03-12: qty 30

## 2014-03-12 MED ORDER — MORPHINE SULFATE 4 MG/ML IJ SOLN
6.0000 mg | Freq: Once | INTRAMUSCULAR | Status: AC
Start: 2014-03-12 — End: 2014-03-12
  Administered 2014-03-12: 6 mg via INTRAVENOUS
  Filled 2014-03-12: qty 2

## 2014-03-12 MED ORDER — LORATADINE 10 MG PO TABS
10.0000 mg | ORAL_TABLET | Freq: Every morning | ORAL | Status: DC
  Administered 2014-03-13: 10 mg via ORAL
  Filled 2014-03-12: qty 1

## 2014-03-12 MED ORDER — ASPIRIN EC 81 MG PO TBEC
81.0000 mg | DELAYED_RELEASE_TABLET | Freq: Every day | ORAL | Status: DC
  Administered 2014-03-13: 81 mg via ORAL
  Filled 2014-03-12: qty 1

## 2014-03-12 MED ORDER — ASPIRIN 81 MG PO CHEW
324.0000 mg | CHEWABLE_TABLET | ORAL | Status: DC
Start: 2014-03-12 — End: 2014-03-13

## 2014-03-12 MED ORDER — HYDROMORPHONE HCL PF 1 MG/ML IJ SOLN
1.0000 mg | Freq: Once | INTRAMUSCULAR | Status: AC
  Administered 2014-03-12: 1 mg via INTRAVENOUS
  Filled 2014-03-12: qty 1

## 2014-03-12 MED ORDER — TIZANIDINE HCL 4 MG PO TABS
4.0000 mg | ORAL_TABLET | Freq: Four times a day (QID) | ORAL | Status: DC | PRN
  Filled 2014-03-12: qty 1

## 2014-03-12 MED ORDER — METOPROLOL TARTRATE 50 MG PO TABS
50.0000 mg | ORAL_TABLET | Freq: Two times a day (BID) | ORAL | Status: DC
  Administered 2014-03-12 – 2014-03-13 (×2): 50 mg via ORAL
  Filled 2014-03-12 (×3): qty 1

## 2014-03-12 MED ORDER — DULOXETINE HCL 60 MG PO CPEP
60.0000 mg | ORAL_CAPSULE | Freq: Two times a day (BID) | ORAL | Status: DC
  Administered 2014-03-12 – 2014-03-13 (×2): 60 mg via ORAL
  Filled 2014-03-12 (×3): qty 1

## 2014-03-12 MED ORDER — OXYCODONE HCL 10 MG PO TABS: 10.0000 mg | ORAL_TABLET | Freq: Two times a day (BID) | ORAL | Status: DC | PRN

## 2014-03-12 NOTE — Progress Notes (Signed)
2159, pt c/o 8/10 chest pain to upper mid chest that was painful with palpation.  VSS, no EKG changes, applied ntg paste per orders, oxygen applied at 2L per [place].  Pt declined SL ntg.  Pt resting with eyes closed upon reassessment.  Will continue to monitor.

## 2014-03-12 NOTE — ED Notes (Signed)
Transporting patient to new room assignment. 

## 2014-03-12 NOTE — ED Provider Notes (Signed)
CSN: [id number]     Arrival date & time 03/12/14  1335 History   First MD Initiated Contact with Patient 03/12/14 1503     Chief Complaint  Patient presents with  . Chest Pain     (Consider location/radiation/quality/duration/timing/severity/associated sxs/prior Treatment) HPI Comments: 53 year old female with history of high blood pressure, obesity, cholesterol, follows Dr. Terrence Dupont for cardiac risk factors and and and presents with 2 hours of chest squeezing sensation and mild diaphoresis. Patient had mild similar presentation is and is unsure if there's any formal diagnosis made. Patient had a stress test in January which is unremarkable I reviewed.Patient denies blood clot history, active cancer, recent major trauma or surgery, , recent long travel, hemoptysis or oral contraceptives. Patient had mild leg swelling past couple days is improved with mild pain. The blood clot history. Chest tightness nonradiating. No tearing sensation   Patient is a 53 y.o. female presenting with chest pain. The history is provided by the patient.  Chest Pain Associated symptoms: diaphoresis, fatigue and shortness of breath   Associated symptoms: no abdominal pain, no back pain, no fever, no headache and not vomiting     Past Medical History  Diagnosis Date  . Hypertension   . Hypercholesteremia   . Lower back pain   . Nontoxic uninodular goiter     sees dr vollmer at [facility]  . Calculus of gallbladder without mention of cholecystitis or obstruction   . Arthritis   . Stroke 2000  . Migraine   . Sleep apnea     STOPBANG=5  . Fibromyalgia    Past Surgical History  Procedure Laterality Date  . Knee arthroscopy  one 1995 and 1 in 1997    both knees done  . Abdominal hysterectomy    . Cesarean section  yrs ago    done x 2  . Surgery for endometriosis  yrs ago  . Thryoid biopsy  December 01, 2011     at [facility]  . Cholecystectomy  01/05/2012    Procedure: LAPAROSCOPIC CHOLECYSTECTOMY WITH  INTRAOPERATIVE CHOLANGIOGRAM;  Surgeon: Pedro Earls, MD;  Location: [facility];  Service: General;  Laterality: N/A;  . Colonoscopy  10/08/2012    Procedure: COLONOSCOPY;  Surgeon: Beryle Beams, MD;  Location: [facility];  Service: Endoscopy;  Laterality: N/A;   Family History  Problem Relation Age of Onset  . Cancer Brother     colon and lung  . Cancer Maternal Grandmother     colon   History  Substance Use Topics  . Smoking status: Never Smoker   . Smokeless tobacco: Never Used  . Alcohol Use: No   OB History   Grav Para Term Preterm Abortions TAB SAB Ect Mult Living                 Review of Systems  Constitutional: Positive for diaphoresis and fatigue. Negative for fever and chills.  HENT: Negative for congestion.   Eyes: Negative for visual disturbance.  Respiratory: Positive for shortness of breath.   Cardiovascular: Positive for chest pain.  Gastrointestinal: Negative for vomiting and abdominal pain.  Genitourinary: Negative for dysuria and flank pain.  Musculoskeletal: Negative for back pain, neck pain and neck stiffness.  Skin: Negative for rash.  Neurological: Negative for light-headedness and headaches.      Allergies  Shrimp and Naproxen  Home Medications   Prior to Admission medications   Medication Sig Start Date End Date Taking? Authorizing Provider  amLODipine (NORVASC) 10 MG tablet Take 10  mg by mouth daily.   Yes Historical Provider, MD  DULoxetine (CYMBALTA) 60 MG capsule Take 60 mg by mouth 2 (two) times daily.    Yes Historical Provider, MD  fluticasone (FLONASE) 50 MCG/ACT nasal spray Place 2 sprays into both nostrils daily.   Yes Historical Provider, MD  lisinopril-hydrochlorothiazide (PRINZIDE,ZESTORETIC) 20-25 MG per tablet Take 1 tablet by mouth every morning.    Yes Historical Provider, MD  loratadine (CLARITIN) 10 MG tablet Take 10 mg by mouth every morning.    Yes Historical Provider, MD  metoprolol succinate (TOPROL-XL) 50 MG 24 hr  tablet Take 50 mg by mouth daily. Take with or immediately following a meal.   Yes Historical Provider, MD  Oxycodone HCl 10 MG TABS Take 10 mg by mouth 2 (two) times daily as needed (pain).   Yes Historical Provider, MD  pravastatin (PRAVACHOL) 40 MG tablet Take 40 mg by mouth at bedtime.    Yes Historical Provider, MD  tiZANidine (ZANAFLEX) 4 MG tablet Take 4 mg by mouth every 6 (six) hours as needed for muscle spasms.   Yes Historical Provider, MD  topiramate (TOPAMAX) 25 MG tablet Take 75 mg by mouth 2 (two) times daily.    Yes Historical Provider, MD  Vitamin D, Ergocalciferol, (DRISDOL) 50000 UNITS CAPS capsule Take 50,000 Units by mouth every 7 (seven) days.   Yes Historical Provider, MD   BP 140/76  Pulse 99  Temp(Src) 97.6 F (36.4 C) (Oral)  Resp 12  Ht 4\' 11"  (1.499 m)  Wt 276 lb (125.193 kg)  BMI 55.72 kg/m2  SpO2 100% Physical Exam  Nursing note and vitals reviewed. Constitutional: She is oriented to person, place, and time. She appears well-developed and well-nourished.  HENT:  Head: Normocephalic and atraumatic.  Eyes: Conjunctivae are normal. Right eye exhibits no discharge. Left eye exhibits no discharge.  Neck: Normal range of motion. Neck supple. No tracheal deviation present.  Cardiovascular: Normal rate, regular rhythm and intact distal pulses.   Pulmonary/Chest: Effort normal and breath sounds normal.  Abdominal: Soft. She exhibits no distension. There is no tenderness. There is no guarding.  Musculoskeletal: She exhibits no edema and no tenderness.  Neurological: She is alert and oriented to person, place, and time.  Skin: Skin is warm. No rash noted.  Psychiatric: She has a normal mood and affect.    ED Course  Procedures (including critical care time) Labs Review Labs Reviewed  CBC - Abnormal; Notable for the following:    WBC 11.5 (*)    All other components within normal limits  BASIC METABOLIC PANEL - Abnormal; Notable for the following:    Sodium  135 (*)    Potassium 3.6 (*)    Chloride 94 (*)    Glucose, Bld 124 (*)    GFR calc non Af Amer 75 (*)    GFR calc Af Amer 87 (*)    All other components within normal limits  TROPONIN I  D-DIMER, QUANTITATIVE  TROPONIN I  Randolm Idol, ED    Imaging Review Dg Chest Port 1 View  03/12/2014   CLINICAL DATA:  Chest pressure.  Shortness of breath.  EXAM: PORTABLE CHEST - 1 VIEW  COMPARISON:  Chest x-ray 01/23/2014 P  FINDINGS: Mediastinum and hilar structures normal. Mild cardiomegaly with normal pulmonary vascularity. No pulmonary infiltrate. No pleural effusion or pneumothorax. No acute bony abnormality.  IMPRESSION: Mild cardiomegaly. No congestive heart failure. No acute pulmonary disease.   Electronically Signed   By: Marcello Moores  Register   On: 03/12/2014 14:23     EKG Interpretation   Date/Time:  Sunday March 12 2014 13:43:11 EDT Ventricular Rate:  103 PR Interval:  139 QRS Duration: 78 QT Interval:  362 QTC Calculation: 474 R Axis:   -17 Text Interpretation:  Sinus tachycardia Borderline left axis deviation  Borderline T wave abnormalities Similar to previous Confirmed by Aissatou Fronczak   MD, Phuong Moffatt ([id number]) on 03/12/2014 3:08:00 PM     repeat EKG reviewed Heart rate 102, and no acute ST elevation, mild prolonged QT, right axis deviation, T wave flattening lateral leads. MDM   Final diagnoses:  None  Chest pain HTN   Patient with 3 cardiac risk factors presents with chest pain possibly concerning for CAD. EKG similar to previous reviewed. Her troponin negative. With shortness breath and pleuritic component along with mild left leg pain and swelling plantar d-dimer is I. think overall is patient will return blood clot. Her cardiologist was paged as planned for observation or admission. Patient received aspirin and nitroglycerin no significant movement in her pain, narcotics ordered.  Patient pain worsened on recheck. Repeat EKG in troponin unremarkable. Discuss case with the  patient's cardiologist to evaluate an ER and admitted.  The patients results and plan were reviewed and discussed.   Any x-rays performed were personally reviewed by myself.   Differential diagnosis were considered with the presenting HPI.  Medications  nitroGLYCERIN (NITROSTAT) SL tablet 0.4 mg (not administered)  fluticasone (FLONASE) 50 MCG/ACT nasal spray 2 spray (not administered)  topiramate (TOPAMAX) tablet 75 mg (75 mg Oral Given 03/12/14 2203)  Vitamin D (Ergocalciferol) (DRISDOL) capsule 50,000 Units (not administered)  DULoxetine (CYMBALTA) DR capsule 60 mg (60 mg Oral Given 03/12/14 2202)  tiZANidine (ZANAFLEX) tablet 4 mg (not administered)  loratadine (CLARITIN) tablet 10 mg (not administered)  aspirin chewable tablet 324 mg (324 mg Oral Not Given 03/12/14 2009)    Or  aspirin suppository 300 mg ( Rectal See Alternative 03/12/14 2009)  aspirin EC tablet 81 mg (not administered)  nitroGLYCERIN (NITROSTAT) SL tablet 0.4 mg (not administered)  0.9 %  sodium chloride infusion ( Intravenous New Bag/Given 03/12/14 2158)  heparin injection 5,000 Units (5,000 Units Subcutaneous Given 03/12/14 2204)  nitroGLYCERIN (NITROGLYN) 2 % ointment 0.5 inch (0.5 inches Topical Given 03/12/14 2159)  metoprolol (LOPRESSOR) tablet 50 mg (50 mg Oral Given 03/12/14 2203)  atorvastatin (LIPITOR) tablet 80 mg (80 mg Oral Given 03/12/14 2159)  pantoprazole (PROTONIX) EC tablet 40 mg (not administered)  oxyCODONE (Oxy IR/ROXICODONE) immediate release tablet 10 mg (10 mg Oral Given 03/12/14 2203)  morphine 4 MG/ML injection 6 mg (6 mg Intravenous Given 03/12/14 1532)  HYDROmorphone (DILAUDID) injection 1 mg (1 mg Intravenous Given 03/12/14 1721)     Filed Vitals:   03/12/14 1815 03/12/14 1845 03/12/14 1915 03/12/14 2030  BP: 143/68 127/61 134/85 152/73  Pulse: 107 104 104 93  Temp:    98.1 F (36.7 C)  TempSrc:      Resp: 16 22 19 20   Height:    4\' 11"  (1.499 m)  Weight:    270 lb 11.2 oz (122.789 kg)   SpO2: 97% 97% 95% 94%    Admission/ observation were discussed with the admitting physician, patient and/or family and they are comfortable with the plan.     Mariea Clonts, MD 03/13/14 [phone]

## 2014-03-12 NOTE — H&P (Signed)
Jodi Bowman is an 53 y.o. female.   Chief Complaint: Chest pain/palpitations HPI: Patient is 53 year old female with past rectal history significant for hypertension, hypercholesteremia, morbid obesity, degenerative joint disease, sleep apnea, history of fibromyalgia, glucose intolerance, history of CVA, and complaints of retrosternal and left-sided chest pain described as pressure grade 10 over 10 associated with palpitation, dizziness, nausea, diaphoresis and mild shortness of breath. states chest pain also increased with breathing. Denies any cough fever chills. Denies any flulike symptoms. Denies any syncopal episode. Denies history of PND orthopnea but complains of her ankle swelling in the left foot . Denies any recent prolonged travel. EKG done in the ER showed normal sinus rhythm and nonspecific T-wave changes no new acute ischemic changes are noted 2 sets of cardiac enzymes have been negative. D-dimer also has been negative. Patient had nuclear stress test few months ago which was negative for ischemia. Patient denies any history of exertional chest pain.  Past Medical History  Diagnosis Date  . Hypertension   . Hypercholesteremia   . Lower back pain   . Nontoxic uninodular goiter     sees dr vollmer at [facility]  . Calculus of gallbladder without mention of cholecystitis or obstruction   . Arthritis   . Stroke 2000  . Migraine   . Sleep apnea     STOPBANG=5  . Fibromyalgia     Past Surgical History  Procedure Laterality Date  . Knee arthroscopy  one 1995 and 1 in 1997    both knees done  . Abdominal hysterectomy    . Cesarean section  yrs ago    done x 2  . Surgery for endometriosis  yrs ago  . Thryoid biopsy  December 01, 2011     at [facility]  . Cholecystectomy  01/05/2012    Procedure: LAPAROSCOPIC CHOLECYSTECTOMY WITH INTRAOPERATIVE CHOLANGIOGRAM;  Surgeon: Pedro Earls, MD;  Location: [facility];  Service: General;  Laterality: N/A;  . Colonoscopy  10/08/2012    Procedure:  COLONOSCOPY;  Surgeon: Beryle Beams, MD;  Location: [facility];  Service: Endoscopy;  Laterality: N/A;    Family History  Problem Relation Age of Onset  . Cancer Brother     colon and lung  . Cancer Maternal Grandmother     colon   Social History:  reports that she has never smoked. She has never used smokeless tobacco. She reports that she does not drink alcohol or use illicit drugs.  Allergies:  Allergies  Allergen Reactions  . Shrimp [Shellfish Allergy] Anaphylaxis  . Naproxen Other (See Comments)    Makes stomach cramp and burning badly.     (Not in a hospital admission)  Results for orders placed during the hospital encounter of 03/12/14 (from the past 48 hour(s))  CBC     Status: Abnormal   Collection Time    03/12/14  1:58 PM      Result Value Ref Range   WBC 11.5 (*) 4.0 - 10.5 K/uL   RBC 4.53  3.87 - 5.11 MIL/uL   Hemoglobin 13.1  12.0 - 15.0 g/dL   HCT 39.6  36.0 - 46.0 %   MCV 87.4  78.0 - 100.0 fL   MCH 28.9  26.0 - 34.0 pg   MCHC 33.1  30.0 - 36.0 g/dL   RDW 14.6  11.5 - 15.5 %   Platelets 300  150 - 400 K/uL  BASIC METABOLIC PANEL     Status: Abnormal   Collection Time  03/12/14  1:58 PM      Result Value Ref Range   Sodium 135 (*) 137 - 147 mEq/L   Potassium 3.6 (*) 3.7 - 5.3 mEq/L   Chloride 94 (*) 96 - 112 mEq/L   CO2 24  19 - 32 mEq/L   Glucose, Bld 124 (*) 70 - 99 mg/dL   BUN 9  6 - 23 mg/dL   Creatinine, Ser 0.86  0.50 - 1.10 mg/dL   Calcium 9.3  8.4 - 10.5 mg/dL   GFR calc non Af Amer 75 (*) >90 mL/min   GFR calc Af Amer 87 (*) >90 mL/min   Comment: (NOTE)     The eGFR has been calculated using the CKD EPI equation.     This calculation has not been validated in all clinical situations.     eGFR's persistently <90 mL/min signify possible Chronic Kidney     Disease.  TROPONIN I     Status: None   Collection Time    03/12/14  1:58 PM      Result Value Ref Range   Troponin I <0.30  <0.30 ng/mL   Comment:            Due to the  release kinetics of cTnI,     a negative result within the first hours     of the onset of symptoms does not rule out     myocardial infarction with certainty.     If myocardial infarction is still suspected,     repeat the test at appropriate intervals.  D-DIMER, QUANTITATIVE     Status: None   Collection Time    03/12/14  1:58 PM      Result Value Ref Range   D-Dimer, Quant 0.34  0.00 - 0.48 ug/mL-FEU   Comment:            AT THE INHOUSE ESTABLISHED CUTOFF     VALUE OF 0.48 ug/mL FEU,     THIS ASSAY HAS BEEN DOCUMENTED     IN THE LITERATURE TO HAVE     A SENSITIVITY AND NEGATIVE     PREDICTIVE VALUE OF AT LEAST     98 TO 99%.  THE TEST RESULT     SHOULD BE CORRELATED WITH     AN ASSESSMENT OF THE CLINICAL     PROBABILITY OF DVT / VTE.  Randolm Idol, ED     Status: None   Collection Time    03/12/14  2:02 PM      Result Value Ref Range   Troponin i, poc 0.01  0.00 - 0.08 ng/mL   Comment 3            Comment: Due to the release kinetics of cTnI,     a negative result within the first hours     of the onset of symptoms does not rule out     myocardial infarction with certainty.     If myocardial infarction is still suspected,     repeat the test at appropriate intervals.  TROPONIN I     Status: None   Collection Time    03/12/14  4:34 PM      Result Value Ref Range   Troponin I <0.30  <0.30 ng/mL   Comment:            Due to the release kinetics of cTnI,     a negative result within the first hours     of the onset of  symptoms does not rule out     myocardial infarction with certainty.     If myocardial infarction is still suspected,     repeat the test at appropriate intervals.   Dg Chest Port 1 View  03/12/2014   CLINICAL DATA:  Chest pressure.  Shortness of breath.  EXAM: PORTABLE CHEST - 1 VIEW  COMPARISON:  Chest x-ray 01/23/2014 P  FINDINGS: Mediastinum and hilar structures normal. Mild cardiomegaly with normal pulmonary vascularity. No pulmonary infiltrate.  No pleural effusion or pneumothorax. No acute bony abnormality.  IMPRESSION: Mild cardiomegaly. No congestive heart failure. No acute pulmonary disease.   Electronically Signed   By: Marcello Moores  Register   On: 03/12/2014 14:23    Review of Systems  Constitutional: Negative for fever and chills.  HENT: Negative for hearing loss.   Eyes: Negative for blurred vision, double vision and photophobia.  Respiratory: Positive for shortness of breath. Negative for cough, hemoptysis and sputum production.   Cardiovascular: Positive for chest pain and palpitations. Negative for orthopnea, claudication and PND.  Gastrointestinal: Negative for nausea, vomiting, abdominal pain, diarrhea and constipation.  Genitourinary: Negative for dysuria.  Neurological: Positive for dizziness. Negative for headaches.    Blood pressure 151/62, pulse 102, temperature 97.6 F (36.4 C), temperature source Oral, resp. rate 10, height _0  (1.499 m), weight 125.193 kg (276 lb), SpO2 99.00%. Physical Exam  Constitutional: She is oriented to person, place, and time.  HENT:  Head: Normocephalic and atraumatic.  Eyes: Conjunctivae are normal. Pupils are equal, round, and reactive to light. Left eye exhibits no discharge. No scleral icterus.  Neck: Normal range of motion. Neck supple. No JVD present. No tracheal deviation present. No thyromegaly present.  Cardiovascular: Normal rate and regular rhythm.   Murmur (Soft systolic murmur noted no S3 gallop) heard. Respiratory: Effort normal and breath sounds normal. No respiratory distress. She has no wheezes. She has no rales.  GI: Soft. Bowel sounds are normal. She exhibits distension. There is no tenderness. There is no rebound.  Musculoskeletal:  No clubbing cyanosis 1+ ankle edema  Neurological: She is alert and oriented to person, place, and time.     Assessment/Plan Atypical chest pain/palpitation rule out coronary insufficiency/rule out cardiac  arrhythmias Hypertension Glucose intolerance Hypercholesteremia Morbid obesity Obesity hypoventilation syndrome Obstructive sleep apnea History of CVA Degenerative joint disease Fibromyalgia Plan As per orders  Min Tunnell N 03/12/2014, 6:20 PM

## 2014-03-12 NOTE — ED Notes (Signed)
[place] EMS[place] central cp that radiates to left brest that started at 1238. Pt reports sob with cp, upon EMS arrival pt noted to be hyperventilating with RR of 30. Pt given 325 mg of aspirin and also 2 nitro with no relief in cp. EMS noted no change in EKG, pt with hx of same cp and had negative stress test done in 2013. nad noted, axo x4, skin warm and dry,

## 2014-03-13 LAB — CBC
HCT: 38.8 % (ref 36.0–46.0)
Hemoglobin: 12.4 g/dL (ref 12.0–15.0)
MCH: 28.4 pg (ref 26.0–34.0)
MCHC: 32 g/dL (ref 30.0–36.0)
MCV: 88.8 fL (ref 78.0–100.0)
Platelets: 266 10*3/uL (ref 150–400)
RBC: 4.37 MIL/uL (ref 3.87–5.11)
RDW: 14.7 % (ref 11.5–15.5)
WBC: 10 10*3/uL (ref 4.0–10.5)

## 2014-03-13 LAB — BASIC METABOLIC PANEL
BUN: 8 mg/dL (ref 6–23)
CO2: 29 mEq/L (ref 19–32)
Calcium: 9 mg/dL (ref 8.4–10.5)
Chloride: 96 mEq/L (ref 96–112)
Creatinine, Ser: 0.79 mg/dL (ref 0.50–1.10)
GFR calc Af Amer: >90 mL/min (ref >90)
GFR calc non Af Amer: >90 mL/min (ref >90)
Glucose, Bld: 120 mg/dL — ABNORMAL HIGH (ref 70–99)
Potassium: 3.6 mEq/L — ABNORMAL LOW (ref 3.7–5.3)
Sodium: 137 mEq/L (ref 137–147)

## 2014-03-13 LAB — LIPID PANEL
Cholesterol: 155 mg/dL (ref 0–200)
HDL: 46 mg/dL (ref >39)
LDL Cholesterol: 86 mg/dL (ref 0–99)
Total CHOL/HDL Ratio: 3.4 RATIO
Triglycerides: 113 mg/dL (ref <150)
VLDL: 23 mg/dL (ref 0–40)

## 2014-03-13 LAB — HEMOGLOBIN A1C
Hgb A1c MFr Bld: 6.2 % — ABNORMAL HIGH (ref <5.7)
Mean Plasma Glucose: 131 mg/dL — ABNORMAL HIGH (ref <117)

## 2014-03-13 LAB — TROPONIN I
Troponin I: <0.3 ng/mL (ref <0.30)
Troponin I: <0.3 ng/mL (ref <0.30)

## 2014-03-13 MED ORDER — PANTOPRAZOLE SODIUM 40 MG PO TBEC: 40.0000 mg | DELAYED_RELEASE_TABLET | Freq: Every day | ORAL | Status: DC

## 2014-03-13 MED ORDER — NITROGLYCERIN 0.4 MG SL SUBL: 0.4000 mg | SUBLINGUAL_TABLET | SUBLINGUAL | Status: DC | PRN

## 2014-03-13 MED ORDER — ASPIRIN 81 MG PO TBEC: 81.0000 mg | DELAYED_RELEASE_TABLET | Freq: Every day | ORAL | Status: DC

## 2014-03-13 MED ORDER — METOPROLOL SUCCINATE ER 100 MG PO TB24: 100.0000 mg | ORAL_TABLET | Freq: Every day | ORAL | Status: DC

## 2014-03-13 MED ORDER — MORPHINE SULFATE 2 MG/ML IJ SOLN
2.0000 mg | Freq: Four times a day (QID) | INTRAMUSCULAR | Status: DC | PRN
  Administered 2014-03-13: 2 mg via INTRAVENOUS
  Filled 2014-03-13: qty 1

## 2014-03-13 NOTE — Discharge Summary (Signed)
  Discharge summary dictated on 03/13/2014 dictation number is [id number]

## 2014-03-13 NOTE — Progress Notes (Signed)
UR Completed.  [place] Jane [phone] 03/13/2014  

## 2014-03-13 NOTE — Discharge Instructions (Signed)
Chest Pain (Nonspecific) Chest pain has many causes. Your pain could be caused by something serious, such as a heart attack or a blood clot in the lungs. It could also be caused by something less serious, such as a chest bruise or a virus. Follow up with your doctor. More lab tests or other studies may be needed to find the cause of your pain. Most of the time, nonspecific chest pain will improve within 2 to 3 days of rest and mild pain medicine. HOME CARE  For chest bruises, you may put ice on the sore area for 15-20 minutes, 03-04 times a day. Do this only if it makes you feel better.  Put ice in a plastic bag.  Place a towel between the skin and the bag.  Rest for the next 2 to 3 days.  Go back to work if the pain improves.  See your doctor if the pain lasts longer than 1 to 2 weeks.  Only take medicine as told by your doctor.  Quit smoking if you smoke. GET HELP RIGHT AWAY IF:   There is more pain or pain that spreads to the arm, neck, jaw, back, or belly (abdomen).  You have shortness of breath.  You cough more than usual or cough up blood.  You have very bad back or belly pain, feel sick to your stomach (nauseous), or throw up (vomit).  You have very bad weakness.  You pass out (faint).  You have a fever. Any of these problems may be serious and may be an emergency. Do not wait to see if the problems will go away. Get medical help right away. Call your local emergency services 911 in [place] Do not drive yourself to the hospital. MAKE SURE YOU:   Understand these instructions.  Will watch this condition.  Will get help right away if you or your child is not doing well or gets worse. Document Released: 03/03/2008 Document Revised: 12/08/2011 Document Reviewed: 03/03/2008 [facility] Patient Information 2014 [place], [place].  Chest Pain Observation It is often hard to give a specific diagnosis for the cause of chest pain. Among other possibilities your symptoms might be  caused by inadequate oxygen delivery to your heart (angina). Angina that is not treated or evaluated can lead to a heart attack (myocardial infarction) or death. Blood tests, electrocardiograms, and X-rays may have been done to help determine a possible cause of your chest pain. After evaluation and observation, your health care provider has determined that it is unlikely your pain was caused by an unstable condition that requires hospitalization. However, a full evaluation of your pain may need to be completed, with additional diagnostic testing as directed. It is very important to keep your follow-up appointments. Not keeping your follow-up appointments could result in permanent heart damage, disability, or death. If there is any problem keeping your follow-up appointments, you must call your health care provider. HOME CARE INSTRUCTIONS  Due to the slight chance that your pain could be angina, it is important to follow your health care provider's treatment plan and also maintain a healthy lifestyle:  Maintain or work toward achieving a healthy weight.  Stay physically active and exercise regularly.  Decrease your salt intake.  Eat a balanced, healthy diet. Talk to a dietician to learn about heart healthy foods.  Increase your fiber intake by including whole grains, vegetables, fruits, and nuts in your diet.  Avoid situations that cause stress, anger, or depression.  Take medicines as advised by your health  care provider. Report any side effects to your health care provider. Do not stop medicines or adjust the dosages on your own.  Quit smoking. Do not use nicotine patches or gum until you check with your health care provider.  Keep your blood pressure, blood sugar, and cholesterol levels within normal limits.  Limit alcohol intake to no more than 1 drink per day for women that are not pregnant and 2 drinks per day for men.  Do not abuse drugs. SEEK IMMEDIATE MEDICAL CARE IF: You have  severe chest pain or pressure which may include symptoms such as:  You feel pain or pressure in you arms, neck, jaw, or back.  You have severe back or abdominal pain, feel sick to your stomach (nauseous), or throw up (vomit).  You are sweating profusely.  You are having a fast or irregular heartbeat.  You feel short of breath while at rest.  You notice increasing shortness of breath during rest, sleep, or with activity.  You have chest pain that does not get better after rest or after taking your usual medicine.  You wake from sleep with chest pain.  You are unable to sleep because you cannot breathe.  You develop a frequent cough or you are coughing up blood.  You feel dizzy, faint, or experience extreme fatigue.  You develop severe weakness, dizziness, fainting, or chills. Any of these symptoms may represent a serious problem that is an emergency. Do not wait to see if the symptoms will go away. Call your local emergency services (911 in the [place] Do not drive yourself to the hospital. MAKE SURE YOU:  Understand these instructions.  Will watch your condition.  Will get help right away if you are not doing well or get worse. Document Released: 10/18/2010 Document Revised: 05/18/2013 Document Reviewed: 03/17/2013 [facility] Patient Information 2014 [place], [place].   Heart Attack in Women Heart attack (myocardial infarction) is one of the leading causes of sudden, unexpected death in women. A heart attack is damage to the heart that is not reversible. A heart attack usually occurs when a heart (coronary) artery becomes narrowed or blocked. The blockage cuts off blood supply to the heart muscle. When one or more of the coronary arteries becomes blocked, that area of the heart begins to die. This can cause pain felt during a heart attack.  If you think you might be having a heart attack, do not ignore your symptoms. Call your local emergency services (911 in [place] immediately. It  is recommended that you take a 162 mg non-enteric coated aspirin if you do not have an aspirin allergy. Do not drive yourself to the hospital or wait to see if your symptoms go away. Early recognition of heart attack symptoms is critical. The sooner a heart attack is treated, the greater the amount of heart muscle saved. Time is muscle. It can save your life. CAUSES  A heart attack can occur from coronary artery disease (CAD). CAD is a process in which the coronary arteries narrow or become blocked from the development of atherosclerosis. Atherosclerosis is a disease in which plaque builds up on the inside of the coronary arteries. Plaque is made up of fats (lipids), cholesterol, calcium, and fibrous tissue. A heart attack can occur due to:  Plaque buildup that can severely narrow or block the coronary arteries and diminish blood flow.  Plaque that can become unstable and "rupture." Unstable plaque that ruptures within a coronary artery can form a clot and cause a sudden (  acute) blockage of the coronary artery.  A severe tightening (spasm) of the coronary artery. This is a less common cause of a heart attack. When a coronary artery spasms, it cuts off blood flow through the coronary artery. Spasms can occur in coronary arteries that do not have atherosclerosis. RISK FACTORS In women, as the level of estrogen in the blood decreases after menopause, the risk of a heart attack increases. Other risk factors of heart attack in women include:  High blood pressure.  High cholesterol levels.  Menopause.  Smoking.  Obesity.  Diabetes.  Hysterectomy.  Previous heart attack.  Lack of regular exercise.  Family history of heart attacks. SYMPTOMS  In women, heart attack symptoms may be different than those in men. Women may not experience the typical chest discomfort or pain, which is considered the primary heart attack symptom in men. Women may describe a feeling of pressure, ache, or tightness in  the chest. Women may experience new or different physical symptoms 1 month or more before a heart attack. Unusual, unexplained fatigue may be the most frequently identified symptom. Sleep disturbances and weakness in the arms may also be considered warning signs.  Other heart attack symptoms that may occur more often in women are:  Unexplained feelings of nervousness or anxiety.  Discomfort between the shoulder blades.  Tingling in the hands and arms.  Swollen arms.  Headaches. Heart attack symptoms for both men and women include:  Pain or discomfort spreading to the neck, shoulder, arm, or jaw.  Shortness of breath.  Sudden cold sweats.  Pain or discomfort in the abdomen.  Heartburn or indigestion with or without vomiting.  Sudden lightheadedness.  Sudden fainting or blackout. PREVENTION The following healthy lifestyle habits may help decrease your risk of heart attacks:  Quitting smoking.  Keeping your blood pressure, blood sugar, and cholesterol levels within normal limits.  Maintaining a healthy weight.  Staying physically active and exercising regularly.  Decreasing your salt intake.  Eating a diet low in saturated fats and cholesterol.  Increasing your fiber intake by including whole grains, vegetables, and fruits in your diet.  Avoiding situations that cause stress, anger, or depression.  Taking medicine as advised by your caregiver. SEEK IMMEDIATE MEDICAL CARE IF:   You have severe chest pain, especially if the pain is crushing or pressure-like and spreads to the arms, back, neck, or jaw. This is an emergency. Do not wait to see if the pain will go away. Call your local emergency services (911 in [place] immediately. Do not drive yourself to the hospital.  You develop shortness of breath during rest, sleep, or with activity.  You have sudden, unexplained sweating or clammy skin.  You feel nauseous or vomit without cause.  You become lightheaded or  dizzy.  You feel your heart beating rapidly or you notice your heart "skipping" beats. MAKE SURE YOU:   Understand these instructions.  Will watch your condition.  Will get help right away if you are not doing well or get worse. Document Released: 03/13/2008 Document Revised: 03/16/2012 Document Reviewed: 12/18/2011 [facility] Patient Information 2014 [place].

## 2014-03-13 NOTE — Progress Notes (Signed)
  Echocardiogram 2D Echocardiogram has been performed.  Donata Clay 03/13/2014, 11:30 AM

## 2014-03-13 NOTE — Progress Notes (Signed)
Pt had 8/10 cp, BP 111/64 with 74HR. EKG obtained with no new changes. 10 mg oxycodone, 3 sl nitro, and 2 mg morphine given with relief. Dr. Terrence Dupont notified. Will continue to monitor patient.

## 2014-03-13 NOTE — Progress Notes (Signed)
Subjective:  Patient complains of musculoskeletal left-sided chest pain.  No anginal pain.  Denies shortness of breath.  Cardiac enzymes have been negative.  Objective:  Vital Signs in the last 24 hours: Temp:  [97.6 F (36.4 C)-98.1 F (36.7 C)] 97.7 F (36.5 C) (06/15 0500) Pulse Rate:  [68-109] 68 (06/15 0500) Resp:  [10-25] 18 (06/15 0500) BP: (116-153)/(61-85) 116/64 mmHg (06/15 0500) SpO2:  [94 %-100 %] 100 % (06/15 0500) Weight:  [122.789 kg (270 lb 11.2 oz)-125.193 kg (276 lb)] 122.789 kg (270 lb 11.2 oz) (06/14 2030)  Intake/Output from previous day: 06/14 0701 - 06/15 0700 In: -  Out: 800 [Urine:800] Intake/Output from this shift: Total I/O In: 240 [P.O.:240] Out: 1300 [Urine:1300]  Physical Exam: Neck: no adenopathy, no carotid bruit, no JVD and supple, symmetrical, trachea midline Lungs: clear to auscultation bilaterally Heart: regular rate and rhythm, S1, S2 normal, no murmur, click, rub or gallop Abdomen: soft, non-tender; bowel sounds normal; no masses,  no organomegaly  Lab Results:  Recent Labs  03/12/14 2120 03/13/14 0047  WBC 9.4 10.0  HGB 12.2 12.4  PLT 284 266    Recent Labs  03/12/14 2120 03/13/14 0047  NA 136* 137  K 3.4* 3.6*  CL 95* 96  CO2 26 29  GLUCOSE 183* 120*  BUN 7 8  CREATININE 0.68 0.79    Recent Labs  03/13/14 0047 03/13/14 0847  TROPONINI <0.30 <0.30   Hepatic Function Panel  Recent Labs  03/12/14 2120  PROT 7.0  ALBUMIN 3.2*  AST 22  ALT 20  ALKPHOS 77  BILITOT 0.5    Recent Labs  03/13/14 0047  CHOL 155   No results found for this basename: PROTIME,  in the last 72 hours  Imaging: Imaging results have been reviewed and Dg Chest Port 1 View  03/12/2014   CLINICAL DATA:  Chest pressure.  Shortness of breath.  EXAM: PORTABLE CHEST - 1 VIEW  COMPARISON:  Chest x-ray 01/23/2014 P  FINDINGS: Mediastinum and hilar structures normal. Mild cardiomegaly with normal pulmonary vascularity. No pulmonary  infiltrate. No pleural effusion or pneumothorax. No acute bony abnormality.  IMPRESSION: Mild cardiomegaly. No congestive heart failure. No acute pulmonary disease.   Electronically Signed   By: Marcello Moores  Register   On: 03/12/2014 14:23    Cardiac Studies:  Assessment/Plan:  Status post atypical chest pain MI ruled out Status post palpitations no significant cardiac arrhythmias Hypertension  Glucose intolerance  Hypercholesteremia  Morbid obesity  Obesity hypoventilation syndrome  Obstructive sleep apnea  History of CVA  Degenerative joint disease  Fibromyalgia Plan Continue present management Check 2-D echo Possible discharge later this evening  LOS: 1 day    Jodi Bowman N 03/13/2014, 1:09 PM

## 2014-03-14 NOTE — Discharge Summary (Signed)
NAMESUMIKO, Bowman              ACCOUNT NO.:  [id number]  MEDICAL RECORD NO.:  [id number]  LOCATION:  3W33C                        FACILITY:  [facility]  PHYSICIAN:  Daliya Parchment N. Terrence Dupont, M.D. DATE OF BIRTH:  19-Oct-1960  DATE OF ADMISSION:  03/12/2014 DATE OF DISCHARGE:  03/13/2014                              DISCHARGE SUMMARY   ADMITTING DIAGNOSES: 1. Atypical chest pain/palpitation, rule out coronary insufficiency.     Rule out cardiac arrhythmias. 2. Hypertension. 3. Glucose intolerance. 4. Hypercholesteremia. 5. Morbid obesity. 6. Obstructive sleep apnea/obesity hypoventilation syndrome. 7. History of cerebrovascular accident. 8. Degenerative joint disease. 9. Fibromyalgia.  DISCHARGE DIAGNOSES: 1. Status post atypical chest pain/palpitation myocardial infarction     ruled out.  No significant cardiac arrhythmias. Recently negative     nuclear stress test. 2. Hypertension. 3. Glucose intolerance. 4. Hypercholesteremia. 5. Morbid obesity. 6. Obesity hypoventilation syndrome. 7. Obstructive sleep apnea. 8. History of cerebrovascular accident. 9. Degenerative joint disease. 10.Fibromyalgia.  DISCHARGE HOME MEDICATIONS: 1. Aspirin 81 mg 1 tablet daily. 2. Nitrostat 0.4 mg sublingual use as directed. 3. Protonix 40 mg 1 tablet daily. 4. Toprol-XL 100 mg 1 tablet daily. 5. Cymbalta 60 mg twice daily as before. 6. Flonase 2 sprays into both nostrils daily as before. 7. Zestoretic 20/25 mg 1 tablet daily. 8. Claritin 10 mg 1 tablet daily. 9. Oxycodone 10 mg twice daily as needed for pain. 10.Pravastatin 40 mg 1 tablet daily at bedtime. 11.Zanaflex 4 mg q.6 hours as needed for muscle spasm. 12.Topamax 75 mg 2 times daily. 13.Vitamin D 50000 units every week. She has been advised to stop amlodipine.  DIET:  Low salt, low cholesterol, 1800 calories ADA diet.  The patient has been advised to stay off the sweets.  Follow up with me in 1 week.  CONDITION AT DISCHARGE:   Stable.  BRIEF HISTORY AND HOSPITAL COURSE:  [name] is 53 year old female with past medical history significant for hypertension, hypercholesteremia, morbid obesity, degenerative joint disease, sleep apnea, history of fibromyalgia, glucose intolerance, history of CVA complains of retrosternal and left-sided chest pain described as pressure, grade 10/10, associated with palpitation, dizziness, nausea, diaphoresis, and mild shortness of breath.  She states chest pain also increases with breathing.  Denies any cough, fever, chills.  Denies flu- like symptoms.  Denies any syncopal episode.  Denies history of PND or orthopnea, but complains of occasional ankle swelling in the left foot. Denies any recent prolonged travel.  EKG done in the ER showed normal sinus rhythm with nonspecific T-wave changes.  No acute ischemic changes were noted.  Two sets of cardiac enzymes were negative.  D-dimers was negative.  The patient had nuclear stress test 3 months ago which was negative for ischemia.  The patient denies any history of exertional chest pain.  PAST MEDICAL HISTORY:  As above.  PAST SURGICAL HISTORY:  She had knee arthroscopic surgery, had abdominal hysterectomy in the past, had C-section x2 in the past she had surgery for endometriosis in the past, had thyroid biopsy in the past, had cholecystectomy in the past, and had colonoscopy in the past.  PHYSICAL EXAMINATION:  GENERAL:  She was alert, awake, oriented x3. VITAL SIGNS:  Blood pressure  was 151/62, pulse was 102. HEENT:  She was afebrile.  Conjunctivae was pink. NECK:  Supple.  No JVD.  No bruit. LUNGS:  Clear to auscultation without rhonchi or rales. CARDIOVASCULAR:  S1, S2 was normal.  There was soft systolic murmur.  No S3, gallop.  ABDOMEN:  Soft.  Bowel sounds were present.  Nontender. EXTREMITIES: There is no clubbing, cyanosis.  There was 1+ ankle edema, nonpitting. NEUROLOGIC:  Grossly intact.  LABORATORY DATA:   Sodium was 135, potassium 3.6, BUN 9 creatinine 0.86. Four sets of troponin-I were normal.  Cholesterol was 155, triglycerides 113, HDL 46, LDL 86 hemoglobin was 13.1, hematocrit 39.6, white count of 11.5, platelet count 300,000.  EKG showed normal sinus rhythm with nonspecific T-wave changes.  2D echo showed normal LV function with normal wall motion, no segmental wall motion abnormalities were noted. Chest x-ray showed mild cardiomegaly; no congestive heart failure, no acute pulmonary disease.  Her TSH was 1.36.  Hemoglobin A1c was 6.2. Her sugar was 124, repeat fasting sugar was 120.  BRIEF HOSPITAL COURSE:  The patient was admitted to telemetry unit.  MI was ruled out by serial enzymes and EKG.  The patient had vague left- sided chest pain, which improved with movement. Also pain exacerbated by local pressure.  There were no anginal chest pain during the hospital stay. The patient is ambulating in room without any problems.  The patient will be discharged home on above medications and will be followed up in my office in 1 week.  If she continues to have recurrent chest pain, we will evaluate with GI workup as outpatient.     Allegra Lai. Terrence Dupont, M.D.     MNH/MEDQ  D:  03/13/2014  T:  03/14/2014  Job:  [id number]

## 2014-03-21 ENCOUNTER — Other Ambulatory Visit (HOSPITAL_COMMUNITY): Payer: Self-pay | Admitting: Family Medicine

## 2014-03-21 DIAGNOSIS — E041 Nontoxic single thyroid nodule: Secondary | ICD-10-CM

## 2014-03-24 ENCOUNTER — Ambulatory Visit (HOSPITAL_COMMUNITY): Payer: Medicaid Other

## 2014-04-13 ENCOUNTER — Emergency Department (HOSPITAL_COMMUNITY)
Admission: EM | Admit: 2014-04-13 | Discharge: 2014-04-13 | Discharge status: Against Medical Advice | Payer: Medicaid Other | Attending: Emergency Medicine | Admitting: Emergency Medicine

## 2014-04-13 ENCOUNTER — Encounter (HOSPITAL_COMMUNITY): Payer: Self-pay | Admitting: Emergency Medicine

## 2014-04-13 DIAGNOSIS — Z8673 Personal history of transient ischemic attack (TIA), and cerebral infarction without residual deficits: Secondary | ICD-10-CM | POA: Diagnosis not present

## 2014-04-13 DIAGNOSIS — E669 Obesity, unspecified: Secondary | ICD-10-CM | POA: Insufficient documentation

## 2014-04-13 DIAGNOSIS — M549 Dorsalgia, unspecified: Secondary | ICD-10-CM | POA: Diagnosis not present

## 2014-04-13 DIAGNOSIS — I10 Essential (primary) hypertension: Secondary | ICD-10-CM | POA: Insufficient documentation

## 2014-04-13 HISTORY — DX: Obesity, unspecified: E66.9

## 2014-04-13 NOTE — ED Notes (Signed)
Pt. reports chronic low back pain worse last night unrelieved by prescription pain medications , denies injury or strenuous activity , history of fibromyalgia and chronic back pain . Denies urinary discomfort . Ambulatory.

## 2014-04-23 ENCOUNTER — Emergency Department (HOSPITAL_COMMUNITY)
Admission: EM | Admit: 2014-04-23 | Discharge: 2014-04-23 | Disposition: A | Discharge status: Home / Self Care | Payer: Medicaid Other | Attending: Emergency Medicine | Admitting: Emergency Medicine

## 2014-04-23 ENCOUNTER — Encounter (HOSPITAL_COMMUNITY): Payer: Self-pay | Admitting: Emergency Medicine

## 2014-04-23 DIAGNOSIS — I10 Essential (primary) hypertension: Secondary | ICD-10-CM | POA: Diagnosis not present

## 2014-04-23 DIAGNOSIS — E7800 Pure hypercholesterolemia, unspecified: Secondary | ICD-10-CM | POA: Insufficient documentation

## 2014-04-23 DIAGNOSIS — Z7982 Long term (current) use of aspirin: Secondary | ICD-10-CM | POA: Insufficient documentation

## 2014-04-23 DIAGNOSIS — Z8719 Personal history of other diseases of the digestive system: Secondary | ICD-10-CM | POA: Insufficient documentation

## 2014-04-23 DIAGNOSIS — IMO0002 Reserved for concepts with insufficient information to code with codable children: Secondary | ICD-10-CM | POA: Insufficient documentation

## 2014-04-23 DIAGNOSIS — M545 Low back pain, unspecified: Secondary | ICD-10-CM | POA: Insufficient documentation

## 2014-04-23 DIAGNOSIS — R519 Headache, unspecified: Secondary | ICD-10-CM

## 2014-04-23 DIAGNOSIS — R51 Headache: Secondary | ICD-10-CM | POA: Diagnosis present

## 2014-04-23 DIAGNOSIS — Z79899 Other long term (current) drug therapy: Secondary | ICD-10-CM | POA: Insufficient documentation

## 2014-04-23 DIAGNOSIS — M129 Arthropathy, unspecified: Secondary | ICD-10-CM | POA: Diagnosis not present

## 2014-04-23 DIAGNOSIS — Z8673 Personal history of transient ischemic attack (TIA), and cerebral infarction without residual deficits: Secondary | ICD-10-CM | POA: Insufficient documentation

## 2014-04-23 DIAGNOSIS — G8929 Other chronic pain: Secondary | ICD-10-CM | POA: Diagnosis not present

## 2014-04-23 DIAGNOSIS — E669 Obesity, unspecified: Secondary | ICD-10-CM | POA: Diagnosis not present

## 2014-04-23 DIAGNOSIS — M549 Dorsalgia, unspecified: Secondary | ICD-10-CM

## 2014-04-23 MED ORDER — HYDROMORPHONE HCL PF 1 MG/ML IJ SOLN
1.0000 mg | Freq: Once | INTRAMUSCULAR | Status: AC
  Administered 2014-04-23: 1 mg via INTRAVENOUS
  Filled 2014-04-23: qty 1

## 2014-04-23 MED ORDER — DIPHENHYDRAMINE HCL 50 MG/ML IJ SOLN
12.5000 mg | Freq: Once | INTRAMUSCULAR | Status: AC
  Administered 2014-04-23: 12.5 mg via INTRAVENOUS
  Filled 2014-04-23: qty 1

## 2014-04-23 MED ORDER — ONDANSETRON 8 MG PO TBDP
8.0000 mg | ORAL_TABLET | Freq: Once | ORAL | Status: AC
  Administered 2014-04-23: 8 mg via ORAL
  Filled 2014-04-23: qty 1

## 2014-04-23 MED ORDER — SODIUM CHLORIDE 0.9 % IV BOLUS (SEPSIS)
1000.0000 mL | Freq: Once | INTRAVENOUS | Status: AC
  Administered 2014-04-23: 1000 mL via INTRAVENOUS

## 2014-04-23 MED ORDER — METOCLOPRAMIDE HCL 5 MG/ML IJ SOLN
10.0000 mg | Freq: Once | INTRAMUSCULAR | Status: AC
  Administered 2014-04-23: 10 mg via INTRAVENOUS
  Filled 2014-04-23: qty 2

## 2014-04-23 NOTE — ED Provider Notes (Signed)
CSN: [id number]     Arrival date & time 04/23/14  1709 History   First MD Initiated Contact with Patient 04/23/14 1842     Chief Complaint  Patient presents with  . Back Pain  . Migraine     (Consider location/radiation/quality/duration/timing/severity/associated sxs/prior Treatment) HPI Comments: Patient with history of migraine headaches, chronic back pain, fibromyalgia -- presents with complaint of left frontal headache around by similar to previous migraines, and midline lower back pain with radiation into her buttocks similar to previous fibromyalgia and chronic lower back pain. All symptoms became uncontrolled earlier today. Patient takes oxycodone 10 mg tablets, Zanaflex, Cymbalta for her chronic pains. She has been taking these however symptoms are currently uncontrolled. Her headache is associated with photophobia and an episode of vomiting. She denies head injury, fevers, neck pain. Patient has had this same pain multiple times in the past. Her back pain is not associated with dysuria, hematuria, weakness in her legs. She denies all red flag signs and symptoms of lower back pain. She has received injections in her back, last injection was approximately 3 months ago. Patient has had this pain is similar in character and intensity in the same area in the past. The onset of this condition was acute. The course is constant. Aggravating factors: none. Alleviating factors: none.    Patient is a 53 y.o. female presenting with back pain and migraines. The history is provided by the patient and medical records.  Back Pain Associated symptoms: headaches   Associated symptoms: no abdominal pain, no chest pain, no dysuria, no fever, no numbness, no pelvic pain and no weakness   Migraine Associated symptoms include headaches. Pertinent negatives include no abdominal pain, chest pain, congestion, coughing, fever, myalgias, nausea, neck pain, numbness, rash, sore throat, vomiting or weakness.     Past Medical History  Diagnosis Date  . Hypertension   . Hypercholesteremia   . Lower back pain   . Nontoxic uninodular goiter     sees dr vollmer at [facility]  . Calculus of gallbladder without mention of cholecystitis or obstruction   . Arthritis   . Stroke 2000  . Migraine   . Sleep apnea     STOPBANG=5  . Fibromyalgia   . Obesity    Past Surgical History  Procedure Laterality Date  . Knee arthroscopy  one 1995 and 1 in 1997    both knees done  . Abdominal hysterectomy    . Cesarean section  yrs ago    done x 2  . Surgery for endometriosis  yrs ago  . Thryoid biopsy  December 01, 2011     at [facility]  . Cholecystectomy  01/05/2012    Procedure: LAPAROSCOPIC CHOLECYSTECTOMY WITH INTRAOPERATIVE CHOLANGIOGRAM;  Surgeon: Pedro Earls, MD;  Location: [facility];  Service: General;  Laterality: N/A;  . Colonoscopy  10/08/2012    Procedure: COLONOSCOPY;  Surgeon: Beryle Beams, MD;  Location: [facility];  Service: Endoscopy;  Laterality: N/A;   Family History  Problem Relation Age of Onset  . Cancer Brother     colon and lung  . Cancer Maternal Grandmother     colon   History  Substance Use Topics  . Smoking status: Never Smoker   . Smokeless tobacco: Never Used  . Alcohol Use: No   OB History   Grav Para Term Preterm Abortions TAB SAB Ect Mult Living                 Review of Systems  Constitutional: Negative for fever and unexpected weight change.  HENT: Negative for congestion, dental problem, rhinorrhea, sinus pressure and sore throat.   Eyes: Negative for photophobia, discharge, redness and visual disturbance.  Respiratory: Negative for cough and shortness of breath.   Cardiovascular: Negative for chest pain.  Gastrointestinal: Negative for nausea, vomiting, abdominal pain, diarrhea and constipation.       Negative for fecal incontinence.   Genitourinary: Negative for dysuria, hematuria, flank pain, vaginal bleeding, vaginal discharge and pelvic pain.        Negative for urinary incontinence or retention.  Musculoskeletal: Positive for back pain. Negative for gait problem, myalgias, neck pain and neck stiffness.  Skin: Negative for rash.  Neurological: Positive for headaches. Negative for syncope, speech difficulty, weakness, light-headedness and numbness.       Denies saddle paresthesias.  Psychiatric/Behavioral: Negative for confusion.      Allergies  Shrimp and Naproxen  Home Medications   Prior to Admission medications   Medication Sig Start Date End Date Taking? Authorizing Provider  aspirin EC 81 MG EC tablet Take 1 tablet (81 mg total) by mouth daily. 03/13/14  Yes Clent Demark, MD  DULoxetine (CYMBALTA) 60 MG capsule Take 60 mg by mouth 2 (two) times daily.    Yes Historical Provider, MD  fluticasone (FLONASE) 50 MCG/ACT nasal spray Place 2 sprays into both nostrils daily.   Yes Historical Provider, MD  lisinopril-hydrochlorothiazide (PRINZIDE,ZESTORETIC) 20-25 MG per tablet Take 1 tablet by mouth every morning.    Yes Historical Provider, MD  loratadine (CLARITIN) 10 MG tablet Take 10 mg by mouth every morning.    Yes Historical Provider, MD  metoprolol succinate (TOPROL-XL) 100 MG 24 hr tablet Take 1 tablet (100 mg total) by mouth daily. Take with or immediately following a meal. 03/13/14  Yes Clent Demark, MD  nitroGLYCERIN (NITROSTAT) 0.4 MG SL tablet Place 1 tablet (0.4 mg total) under the tongue every 5 (five) minutes x 3 doses as needed for chest pain. 03/13/14  Yes Clent Demark, MD  Oxycodone HCl 10 MG TABS Take 10 mg by mouth 2 (two) times daily as needed (pain).   Yes Historical Provider, MD  pantoprazole (PROTONIX) 40 MG tablet Take 40 mg by mouth at bedtime.   Yes Historical Provider, MD  pravastatin (PRAVACHOL) 40 MG tablet Take 40 mg by mouth at bedtime.    Yes Historical Provider, MD  tiZANidine (ZANAFLEX) 4 MG tablet Take 4 mg by mouth every 6 (six) hours as needed for muscle spasms.   Yes Historical Provider, MD    topiramate (TOPAMAX) 25 MG tablet Take 75 mg by mouth 2 (two) times daily.    Yes Historical Provider, MD  Vitamin D, Ergocalciferol, (DRISDOL) 50000 UNITS CAPS capsule Take 50,000 Units by mouth every 7 (seven) days.   Yes Historical Provider, MD   BP 171/86  Pulse 88  Temp(Src) 98.4 F (36.9 C) (Oral)  Resp 20  SpO2 95% Physical Exam  Nursing note and vitals reviewed. Constitutional: She is oriented to person, place, and time. She appears well-developed and well-nourished.  HENT:  Head: Normocephalic and atraumatic.  Right Ear: Tympanic membrane, external ear and ear canal normal.  Left Ear: Tympanic membrane, external ear and ear canal normal.  Nose: Nose normal.  Mouth/Throat: Uvula is midline, oropharynx is clear and moist and mucous membranes are normal.  Eyes: Conjunctivae, EOM and lids are normal. Pupils are equal, round, and reactive to light. Right eye exhibits no discharge. Left eye  exhibits no discharge. Right eye exhibits no nystagmus. Left eye exhibits no nystagmus.  Neck: Normal range of motion. Neck supple.  Cardiovascular: Normal rate, regular rhythm and normal heart sounds.   Pulmonary/Chest: Effort normal and breath sounds normal.  Abdominal: Soft. There is no tenderness. There is no rebound, no guarding and no CVA tenderness.  Musculoskeletal:       Cervical back: She exhibits normal range of motion, no tenderness and no bony tenderness.       Thoracic back: Normal.       Lumbar back: She exhibits tenderness and bony tenderness. She exhibits normal range of motion.       Back:  No step-off noted with palpation of spine.   Neurological: She is alert and oriented to person, place, and time. She has normal strength and normal reflexes. No cranial nerve deficit or sensory deficit. She displays a negative Romberg sign. Coordination and gait normal. GCS eye subscore is 4. GCS verbal subscore is 5. GCS motor subscore is 6.  5/5 strength in entire lower extremities  bilaterally. No sensation deficit.   Skin: Skin is warm and dry. No rash noted.  Psychiatric: She has a normal mood and affect.    ED Course  Procedures (including critical care time) Labs Review Labs Reviewed - No data to display  Imaging Review No results found.   EKG Interpretation None      Patient seen and examined. Work-up initiated. Medications ordered.   Vital signs reviewed and are as follows: BP 171/86  Pulse 88  Temp(Src) 98.4 F (36.9 C) (Oral)  Resp 20  SpO2 95%  Will re-evaluate.   8:16 PM patient states that her headache is improved to 5/10 in her back pain is improved as well. She would like to go home and take her usual medications for pain. Plan discussed with patient and her husband at bedside who are both in agreement with the plan.  Encouraged patient to follow up with her primary care physician this week.  Encouraged patient to return with worsening symptoms, especially if they are different than her typical chronic pain symptoms. Patient verbalizes understanding and agrees with plan.    MDM   Final diagnoses:  Acute nonintractable headache, unspecified headache type  Chronic back pain   Headache: Exactly same as previous migraines. Patient without high-risk features of headache including: sudden onset/thunderclap HA, no similar headache in past, altered mental status, accompanying seizure, headache with exertion, age > 41, history of immunocompromised, neck or shoulder pain, fever, use of anticoagulation, family history of spontaneous SAH, concomitant drug use, toxic exposure.   Patient has a normal complete neurological exam, normal vital signs, normal level of consciousness, no signs of meningismus, is well-appearing/non-toxic appearing, no signs of trauma, no pain over the temporal arteries.   Imaging with CT/MRI not indicated given history and physical exam findings.   No dangerous or life-threatening conditions suspected or identified by  history, physical exam, and by work-up. No indications for hospitalization identified.    Back pain: Patient with chronic back pain usually controlled on oral oxycodone. Patient's pain tonight is the same as previous back pain without any abnormal features. No neurological deficits. Patient is ambulatory. No warning symptoms of back pain including: loss of bowel or bladder control, night sweats, waking from sleep with back pain, unexplained fevers or weight loss, h/o cancer, IVDU, recent trauma. No concern for cauda equina, epidural abscess, or other serious cause of back pain. Conservative measures such as rest, ice/heat  and pain medicine indicated with PCP follow-up if no improvement with conservative management.     Carlisle Cater, PA-C 04/23/14 2142

## 2014-04-23 NOTE — ED Notes (Signed)
Pt c/o low back pain since last night. Hx fibromyalgia and bulging discs. Pain goes down into legs. Also c/o frontal migraine since this am. +light sensitivity. N/V x2.

## 2014-04-23 NOTE — Discharge Instructions (Signed)
Please read and follow all provided instructions.  Your diagnoses today include:  1. Acute nonintractable headache, unspecified headache type   2. Chronic back pain     Tests performed today include:  Vital signs. See below for your results today.   Medications:  In the Emergency Department you received:  Reglan - antinausea/headache medication  Benadryl - antihistamine to counteract potential side effects of reglan  Dilaudid - narcotic pain medication  Take any prescribed medications only as directed.  Additional information:  Follow any educational materials contained in this packet.  You are having a headache. No specific cause was found today for your headache. It may have been a migraine or other cause of headache. Stress, anxiety, fatigue, and depression are common triggers for headaches.   Your headache today does not appear to be life-threatening or require hospitalization, but often the exact cause of headaches is not determined in the emergency department. Therefore, follow-up with your doctor is very important to find out what may have caused your headache and whether or not you need any further diagnostic testing or treatment.   Sometimes headaches can appear benign (not harmful), but then more serious symptoms can develop which should prompt an immediate re-evaluation by your doctor or the emergency department.  BE VERY CAREFUL not to take multiple medicines containing Tylenol (also called acetaminophen). Doing so can lead to an overdose which can damage your liver and cause liver failure and possibly death.   Follow-up instructions: Please follow-up with your primary care provider in the next 3 days for further evaluation of your symptoms.   Return instructions:   Please return to the Emergency Department if you experience worsening symptoms.  Return if the medications do not resolve your headache, if it recurs, or if you have multiple episodes of vomiting or  cannot keep down fluids.  Return if you have a change from the usual headache.  RETURN IMMEDIATELY IF you:  Develop a sudden, severe headache  Develop confusion or become poorly responsive or faint  Develop a fever above 100.35F or problem breathing  Have a change in speech, vision, swallowing, or understanding  Develop new weakness, numbness, tingling, incoordination in your arms or legs  Have a seizure  Please return if you have any other emergent concerns.  SEEK IMMEDIATE MEDICAL ATTENTION IF YOU HAVE:  New numbness, tingling, weakness, or problem with the use of your arms or legs  Severe back pain not relieved with medications  Loss control of your bowels or bladder  Increasing pain in any areas of the body (such as chest or abdominal pain)  Shortness of breath, dizziness, or fainting.   Worsening nausea (feeling sick to your stomach), vomiting, fever, or sweats  Any other emergent concerns regarding your health     Additional Information:  Your vital signs today were: BP 147/72   Pulse 80   Temp(Src) 98.4 F (36.9 C) (Oral)   Resp 18   SpO2 93% If your blood pressure (BP) was elevated above 135/85 this visit, please have this repeated by your doctor within one month. --------------

## 2014-05-01 NOTE — ED Provider Notes (Signed)
Medical screening examination/treatment/procedure(s) were performed by non-physician practitioner and as supervising physician I was immediately available for consultation/collaboration.   EKG Interpretation None        Tanna Furry, MD 05/01/14 1530

## 2014-05-15 ENCOUNTER — Emergency Department (HOSPITAL_COMMUNITY)
Admission: EM | Admit: 2014-05-15 | Discharge: 2014-05-15 | Disposition: A | Discharge status: Home / Self Care | Payer: Medicaid Other

## 2014-05-19 ENCOUNTER — Ambulatory Visit (HOSPITAL_COMMUNITY)
Admission: RE | Admit: 2014-05-19 | Discharge: 2014-05-19 | Disposition: A | Discharge status: Home / Self Care | Payer: Medicaid Other | Source: Ambulatory Visit | Attending: Family Medicine | Admitting: Family Medicine

## 2014-05-19 DIAGNOSIS — E041 Nontoxic single thyroid nodule: Secondary | ICD-10-CM | POA: Insufficient documentation

## 2014-05-26 ENCOUNTER — Encounter (HOSPITAL_COMMUNITY): Payer: Self-pay | Admitting: Emergency Medicine

## 2014-05-26 ENCOUNTER — Emergency Department (HOSPITAL_COMMUNITY)
Admission: EM | Admit: 2014-05-26 | Discharge: 2014-05-27 | Disposition: A | Discharge status: Home / Self Care | Payer: Medicaid Other | Attending: Emergency Medicine | Admitting: Emergency Medicine

## 2014-05-26 DIAGNOSIS — M129 Arthropathy, unspecified: Secondary | ICD-10-CM | POA: Diagnosis not present

## 2014-05-26 DIAGNOSIS — Z79899 Other long term (current) drug therapy: Secondary | ICD-10-CM | POA: Insufficient documentation

## 2014-05-26 DIAGNOSIS — G43909 Migraine, unspecified, not intractable, without status migrainosus: Secondary | ICD-10-CM | POA: Insufficient documentation

## 2014-05-26 DIAGNOSIS — IMO0001 Reserved for inherently not codable concepts without codable children: Secondary | ICD-10-CM | POA: Diagnosis not present

## 2014-05-26 DIAGNOSIS — I10 Essential (primary) hypertension: Secondary | ICD-10-CM | POA: Diagnosis not present

## 2014-05-26 DIAGNOSIS — Z8673 Personal history of transient ischemic attack (TIA), and cerebral infarction without residual deficits: Secondary | ICD-10-CM | POA: Insufficient documentation

## 2014-05-26 DIAGNOSIS — E7800 Pure hypercholesterolemia, unspecified: Secondary | ICD-10-CM | POA: Insufficient documentation

## 2014-05-26 DIAGNOSIS — Z7982 Long term (current) use of aspirin: Secondary | ICD-10-CM | POA: Insufficient documentation

## 2014-05-26 DIAGNOSIS — R519 Headache, unspecified: Secondary | ICD-10-CM

## 2014-05-26 DIAGNOSIS — IMO0002 Reserved for concepts with insufficient information to code with codable children: Secondary | ICD-10-CM | POA: Diagnosis not present

## 2014-05-26 DIAGNOSIS — E669 Obesity, unspecified: Secondary | ICD-10-CM | POA: Insufficient documentation

## 2014-05-26 DIAGNOSIS — Z8719 Personal history of other diseases of the digestive system: Secondary | ICD-10-CM | POA: Diagnosis not present

## 2014-05-26 DIAGNOSIS — R51 Headache: Secondary | ICD-10-CM

## 2014-05-26 MED ORDER — FENTANYL CITRATE 0.05 MG/ML IJ SOLN
50.0000 ug | Freq: Once | INTRAMUSCULAR | Status: AC
  Administered 2014-05-26: 50 ug via NASAL
  Filled 2014-05-26: qty 2

## 2014-05-26 MED ORDER — DIPHENHYDRAMINE HCL 25 MG PO CAPS
50.0000 mg | ORAL_CAPSULE | Freq: Once | ORAL | Status: AC
  Administered 2014-05-26: 50 mg via ORAL
  Filled 2014-05-26: qty 2

## 2014-05-26 MED ORDER — KETOROLAC TROMETHAMINE 60 MG/2ML IM SOLN
60.0000 mg | Freq: Once | INTRAMUSCULAR | Status: AC
  Administered 2014-05-26: 60 mg via INTRAMUSCULAR
  Filled 2014-05-26: qty 2

## 2014-05-26 MED ORDER — METOCLOPRAMIDE HCL 10 MG PO TABS
10.0000 mg | ORAL_TABLET | Freq: Once | ORAL | Status: AC
  Administered 2014-05-26: 10 mg via ORAL
  Filled 2014-05-26: qty 1

## 2014-05-26 NOTE — ED Notes (Signed)
Patient hooked to cardic  Monitor, blood pressure cuff in place and pulse on on

## 2014-05-26 NOTE — ED Notes (Signed)
Pt reports having fibromyalgia and having increase in chronic pain to her neck, back and arms x 3 days and also having a migraine, n/v. No acute distress noted at triage.

## 2014-05-26 NOTE — ED Provider Notes (Signed)
CSN: [id number]     Arrival date & time 05/26/14  1652 History   First MD Initiated Contact with Patient 05/26/14 2125     Chief Complaint  Patient presents with  . Pain  . Migraine   Ms. Riede is a 53 year old female with past medical history of migraines and fibromyalgia who presents today with 3 days of worsening back pain, shoulder pain. Patient states she also again having a migraine today. Patient states her back pain and shoulder pain is identical to her chronic pain related to her fibromyalgia and she was seen for it yesterday by rheumatology. Patient states she was not given anything for pain at the rheumatology clinic. Patient describes her migraine as a left frontal, throbbing headache and describes having photophobia, nausea, vomiting. She states this migraine feels identical to what she's had in the past.  (Consider location/radiation/quality/duration/timing/severity/associated sxs/prior Treatment) Patient is a 53 y.o. female presenting with migraines.  Migraine This is a chronic problem. The problem occurs intermittently. The problem has been gradually worsening. Associated symptoms include headaches, nausea and vomiting. Pertinent negatives include no abdominal pain, fever, numbness, sore throat, urinary symptoms, visual change or weakness. Nothing aggravates the symptoms. She has tried acetaminophen and oral narcotics for the symptoms. The treatment provided no relief.     Past Medical History  Diagnosis Date  . Hypertension   . Hypercholesteremia   . Lower back pain   . Nontoxic uninodular goiter     sees dr vollmer at [facility]  . Calculus of gallbladder without mention of cholecystitis or obstruction   . Arthritis   . Stroke 2000  . Migraine   . Sleep apnea     STOPBANG=5  . Fibromyalgia   . Obesity    Past Surgical History  Procedure Laterality Date  . Knee arthroscopy  one 1995 and 1 in 1997    both knees done  . Abdominal hysterectomy    . Cesarean  section  yrs ago    done x 2  . Surgery for endometriosis  yrs ago  . Thryoid biopsy  December 01, 2011     at [facility]  . Cholecystectomy  01/05/2012    Procedure: LAPAROSCOPIC CHOLECYSTECTOMY WITH INTRAOPERATIVE CHOLANGIOGRAM;  Surgeon: Pedro Earls, MD;  Location: [facility];  Service: General;  Laterality: N/A;  . Colonoscopy  10/08/2012    Procedure: COLONOSCOPY;  Surgeon: Beryle Beams, MD;  Location: [facility];  Service: Endoscopy;  Laterality: N/A;   Family History  Problem Relation Age of Onset  . Cancer Brother     colon and lung  . Cancer Maternal Grandmother     colon   History  Substance Use Topics  . Smoking status: Never Smoker   . Smokeless tobacco: Never Used  . Alcohol Use: No   OB History   Grav Para Term Preterm Abortions TAB SAB Ect Mult Living                 Review of Systems  Constitutional: Negative for fever.  HENT: Negative for sore throat.   Gastrointestinal: Positive for nausea and vomiting. Negative for abdominal pain.  Neurological: Positive for headaches. Negative for weakness and numbness.  Psychiatric/Behavioral: Negative.       Allergies  Shrimp and Naproxen  Home Medications   Prior to Admission medications   Medication Sig Start Date End Date Taking? Authorizing Provider  aspirin EC 81 MG EC tablet Take 1 tablet (81 mg total) by mouth daily. 03/13/14  Yes Prudencio Burly  Daivd Council, MD  DULoxetine (CYMBALTA) 60 MG capsule Take 60 mg by mouth 2 (two) times daily.    Yes Historical Provider, MD  fluticasone (FLONASE) 50 MCG/ACT nasal spray Place 2 sprays into both nostrils daily.   Yes Historical Provider, MD  lisinopril-hydrochlorothiazide (PRINZIDE,ZESTORETIC) 20-25 MG per tablet Take 1 tablet by mouth every morning.    Yes Historical Provider, MD  loratadine (CLARITIN) 10 MG tablet Take 10 mg by mouth every morning.    Yes Historical Provider, MD  metoprolol succinate (TOPROL-XL) 100 MG 24 hr tablet Take 1 tablet (100 mg total) by mouth daily. Take  with or immediately following a meal. 03/13/14  Yes Clent Demark, MD  nitroGLYCERIN (NITROSTAT) 0.4 MG SL tablet Place 1 tablet (0.4 mg total) under the tongue every 5 (five) minutes x 3 doses as needed for chest pain. 03/13/14  Yes Clent Demark, MD  Oxycodone HCl 10 MG TABS Take 10 mg by mouth 2 (two) times daily as needed (pain).   Yes Historical Provider, MD  pantoprazole (PROTONIX) 40 MG tablet Take 40 mg by mouth at bedtime.   Yes Historical Provider, MD  pravastatin (PRAVACHOL) 40 MG tablet Take 40 mg by mouth at bedtime.    Yes Historical Provider, MD  tiZANidine (ZANAFLEX) 4 MG tablet Take 4 mg by mouth every 6 (six) hours as needed for muscle spasms.   Yes Historical Provider, MD  topiramate (TOPAMAX) 25 MG tablet Take 75 mg by mouth 2 (two) times daily.    Yes Historical Provider, MD  Vitamin D, Ergocalciferol, (DRISDOL) 50000 UNITS CAPS capsule Take 50,000 Units by mouth every 7 (seven) days. Take on wednesdays   Yes Historical Provider, MD   BP 115/59  Pulse 81  Temp(Src) 98.2 F (36.8 C) (Oral)  Resp 20  SpO2 97% Physical Exam  Constitutional: She is oriented to person, place, and time. Vital signs are normal. She appears well-developed and well-nourished. No distress.  Morbidly obese female  HENT:  Head: Normocephalic and atraumatic.  Mouth/Throat: Oropharynx is clear and moist. No oropharyngeal exudate.  Eyes: Right eye exhibits no discharge. Left eye exhibits no discharge. No scleral icterus.  Neck: Normal range of motion.  Cardiovascular: Normal rate, regular rhythm and normal heart sounds.   No murmur heard. Pulmonary/Chest: Effort normal and breath sounds normal. No respiratory distress.  Abdominal: Soft. There is no tenderness.  Musculoskeletal: Normal range of motion. She exhibits no edema and no tenderness.       Arms: Pain/tenderness noted to the entire lumbar region.   Neurological: She is alert and oriented to person, place, and time. No cranial nerve  deficit. Coordination normal.  Skin: Skin is warm and dry. No rash noted. She is not diaphoretic.  Psychiatric: She has a normal mood and affect.    ED Course  Procedures (including critical care time) Labs Review Labs Reviewed - No data to display  Imaging Review No results found.   EKG Interpretation None      MDM   Final diagnoses:  Nonintractable headache, unspecified chronicity pattern, unspecified headache type    53 year old female with fibromyalgia, chronic back pain and chronic migraines presenting to the ER tonight with 3 days of back pain and migraines. Patient describing her migraines as a left frontal, throbbing pain with associated mild nausea and associated photophobia. She states her symptoms are identical to her typical migraines. No concern at this time for a new headache since patient is describing her headache as the same  as typical for her. No associated neurologic deficit. Neuro exam benign. Patient's back pain she describes as being identical to her chronic back pain and fibromyalgia pain, no new pain, no new weakness no new numbness, tingling, saddle anesthesia, urinary or bowel retention/incontinence, cancer, IV drug use, fever.  We will treat patient's pain in the ED.    After Toradol, Reglan, Benadryl patient states her headache is "almost gone" patient states she's feeling much more comfortable and is ready to go home. We will discharge patient at this time. We strongly encouraged patient to follow up with her PCP regarding this issue. Patient is agreeable to this plan. We also encouraged patient to call or return to the ER should her symptoms continue, worsen, persist or should she have any questions or concerns.  Filed Vitals:   05/27/14 0015  BP: 115/59  Pulse: 81  Temp:   Resp:     Signed,  Dahlia Bailiff, PA-C 2:43 AM   This patient seen and discussed with Dr. Virgel Manifold, M.D.    Carrie Mew, PA-C 05/27/14 [phone]

## 2014-05-27 NOTE — ED Provider Notes (Signed)
Medical screening examination/treatment/procedure(s) were conducted as a shared visit with non-physician practitioner(s) and myself.  I personally evaluated the patient during the encounter.   EKG Interpretation None     52yF with HA. History of what she calls migraines. Suspect primary HA today. Consider emergent secondary causes such as bleed, infectious or mass but doubt. There is no history of trauma. Pt has a nonfocal neurological exam. Afebrile and neck supple. No use of blood thinning medication. Consider ocular etiology such as acute angle closure glaucoma but doubt. Pt denies acute change in visual acuity and eye exam unremarkable. Doubt temporal arteritis. No temporal tenderness and temporal artery pulsations palpable. Doubt CO poisoning. No contacts with similar symptoms. Doubt venous thrombosis. Doubt carotid or vertebral arteries dissection. Symptoms improved with meds. Typically takes topamax for her headaches. Does not need refill. Feel that can be safely discharged, but strict return precautions discussed. Outpt fu.   Virgel Manifold, MD 05/27/14 [phone]

## 2014-05-27 NOTE — Discharge Instructions (Signed)

## 2014-05-27 NOTE — ED Provider Notes (Signed)
Medical screening examination/treatment/procedure(s) were conducted as a shared visit with non-physician practitioner(s) and myself.  I personally evaluated the patient during the encounter.   EKG Interpretation None     See other note.   Virgel Manifold, MD 05/27/14 2221

## 2014-06-20 ENCOUNTER — Other Ambulatory Visit: Payer: Self-pay | Admitting: Family Medicine

## 2014-06-20 DIAGNOSIS — E041 Nontoxic single thyroid nodule: Secondary | ICD-10-CM

## 2014-06-25 ENCOUNTER — Encounter (HOSPITAL_COMMUNITY): Payer: Self-pay | Admitting: Emergency Medicine

## 2014-06-25 ENCOUNTER — Emergency Department (HOSPITAL_COMMUNITY)
Admission: EM | Admit: 2014-06-25 | Discharge: 2014-06-25 | Disposition: A | Discharge status: Home / Self Care | Payer: Medicaid Other | Attending: Emergency Medicine | Admitting: Emergency Medicine

## 2014-06-25 DIAGNOSIS — Z8673 Personal history of transient ischemic attack (TIA), and cerebral infarction without residual deficits: Secondary | ICD-10-CM | POA: Diagnosis not present

## 2014-06-25 DIAGNOSIS — E7800 Pure hypercholesterolemia, unspecified: Secondary | ICD-10-CM | POA: Diagnosis not present

## 2014-06-25 DIAGNOSIS — I10 Essential (primary) hypertension: Secondary | ICD-10-CM | POA: Diagnosis not present

## 2014-06-25 DIAGNOSIS — M25559 Pain in unspecified hip: Secondary | ICD-10-CM | POA: Diagnosis not present

## 2014-06-25 DIAGNOSIS — Z79899 Other long term (current) drug therapy: Secondary | ICD-10-CM | POA: Diagnosis not present

## 2014-06-25 DIAGNOSIS — G43909 Migraine, unspecified, not intractable, without status migrainosus: Secondary | ICD-10-CM | POA: Insufficient documentation

## 2014-06-25 DIAGNOSIS — E669 Obesity, unspecified: Secondary | ICD-10-CM | POA: Diagnosis not present

## 2014-06-25 DIAGNOSIS — M797 Fibromyalgia: Secondary | ICD-10-CM

## 2014-06-25 DIAGNOSIS — Z7982 Long term (current) use of aspirin: Secondary | ICD-10-CM | POA: Insufficient documentation

## 2014-06-25 DIAGNOSIS — G8929 Other chronic pain: Secondary | ICD-10-CM | POA: Insufficient documentation

## 2014-06-25 DIAGNOSIS — Z8719 Personal history of other diseases of the digestive system: Secondary | ICD-10-CM | POA: Insufficient documentation

## 2014-06-25 DIAGNOSIS — M545 Low back pain, unspecified: Secondary | ICD-10-CM | POA: Insufficient documentation

## 2014-06-25 DIAGNOSIS — IMO0001 Reserved for inherently not codable concepts without codable children: Secondary | ICD-10-CM | POA: Insufficient documentation

## 2014-06-25 DIAGNOSIS — M129 Arthropathy, unspecified: Secondary | ICD-10-CM | POA: Diagnosis not present

## 2014-06-25 MED ORDER — HYDROMORPHONE HCL 1 MG/ML IJ SOLN
1.0000 mg | Freq: Once | INTRAMUSCULAR | Status: AC
  Administered 2014-06-25: 1 mg via INTRAMUSCULAR
  Filled 2014-06-25: qty 1

## 2014-06-25 MED ORDER — KETOROLAC TROMETHAMINE 60 MG/2ML IM SOLN
60.0000 mg | Freq: Once | INTRAMUSCULAR | Status: AC
  Administered 2014-06-25: 60 mg via INTRAMUSCULAR
  Filled 2014-06-25: qty 2

## 2014-06-25 NOTE — ED Provider Notes (Signed)
CSN: [id number]     Arrival date & time 06/25/14  2127 History   First MD Initiated Contact with Patient 06/25/14 2141     This chart was scribed for non-physician practitioner, Antonietta Breach, PA-C working with No att. providers found by Forrestine Him, ED Scribe. This patient was seen in room WTR8/WTR8 and the patient's care was started at 11:08 PM.   Chief Complaint  Patient presents with  . Back Pain  . Hip Pain   The history is provided by the patient. No language interpreter was used.   HPI Comments: Jodi Bowman is a 53 y.o. female with a PMHx of HTN, hypercholesteremia, bulging disc, and fibromyalgia who presents to the Emergency Department complaining of constant, moderate lower back pain and bilateral hip pain x 1 day. Pt attributes current discomfort to history of Fibromyalgia. Pain is exacerbated with certain movements and ambulation. She has tried prescribed Oxycodone, Lyrica, and Cymbalta without any improvement for symptoms. However, at time she experiences relief temporarily from these medications when pain comes. She denies any fever or chills. No bowel or urinary incontinence. No history of cancer or IV drug use. She is followed by a pain management. Plan for follow up in October. She states that her back pain feels identical to past back pain exacerbations. She denies any new trauma or injury to her back.  Past Medical History  Diagnosis Date  . Hypertension   . Hypercholesteremia   . Lower back pain   . Nontoxic uninodular goiter     sees dr vollmer at [facility]  . Calculus of gallbladder without mention of cholecystitis or obstruction   . Arthritis   . Stroke 2000  . Migraine   . Sleep apnea     STOPBANG=5  . Fibromyalgia   . Obesity    Past Surgical History  Procedure Laterality Date  . Knee arthroscopy  one 1995 and 1 in 1997    both knees done  . Abdominal hysterectomy    . Cesarean section  yrs ago    done x 2  . Surgery for endometriosis  yrs ago  .  Thryoid biopsy  December 01, 2011     at [facility]  . Cholecystectomy  01/05/2012    Procedure: LAPAROSCOPIC CHOLECYSTECTOMY WITH INTRAOPERATIVE CHOLANGIOGRAM;  Surgeon: Pedro Earls, MD;  Location: [facility];  Service: General;  Laterality: N/A;  . Colonoscopy  10/08/2012    Procedure: COLONOSCOPY;  Surgeon: Beryle Beams, MD;  Location: [facility];  Service: Endoscopy;  Laterality: N/A;   Family History  Problem Relation Age of Onset  . Cancer Brother     colon and lung  . Cancer Maternal Grandmother     colon   History  Substance Use Topics  . Smoking status: Never Smoker   . Smokeless tobacco: Never Used  . Alcohol Use: No   OB History   Grav Para Term Preterm Abortions TAB SAB Ect Mult Living                 Review of Systems  Constitutional: Negative for fever and chills.  Genitourinary: Negative for dysuria.  Musculoskeletal: Positive for arthralgias and back pain.  All other systems reviewed and are negative.   Allergies  Shrimp and Naproxen  Home Medications   Prior to Admission medications   Medication Sig Start Date End Date Taking? Authorizing Provider  amLODipine (NORVASC) 10 MG tablet Take 10 mg by mouth daily.  03/01/14  Yes Historical Provider, MD  aspirin EC 81 MG EC tablet Take 1 tablet (81 mg total) by mouth daily. 03/13/14  Yes Clent Demark, MD  cholecalciferol (VITAMIN D) 1000 UNITS tablet Take 1,000 Units by mouth daily.   Yes Historical Provider, MD  DULoxetine (CYMBALTA) 60 MG capsule Take 60 mg by mouth 2 (two) times daily.    Yes Historical Provider, MD  fluticasone (FLONASE) 50 MCG/ACT nasal spray Place 2 sprays into both nostrils daily as needed for allergies.    Yes Historical Provider, MD  lisinopril-hydrochlorothiazide (PRINZIDE,ZESTORETIC) 20-25 MG per tablet Take 1 tablet by mouth every morning.    Yes Historical Provider, MD  loratadine (CLARITIN) 10 MG tablet Take 10 mg by mouth every morning.    Yes Historical Provider, MD  metoprolol succinate  (TOPROL-XL) 100 MG 24 hr tablet Take 1 tablet (100 mg total) by mouth daily. Take with or immediately following a meal. 03/13/14  Yes Clent Demark, MD  nitroGLYCERIN (NITROSTAT) 0.4 MG SL tablet Place 1 tablet (0.4 mg total) under the tongue every 5 (five) minutes x 3 doses as needed for chest pain. 03/13/14  Yes Clent Demark, MD  Oxycodone HCl 10 MG TABS Take 10 mg by mouth 2 (two) times daily as needed (pain).   Yes Historical Provider, MD  pantoprazole (PROTONIX) 40 MG tablet Take 40 mg by mouth at bedtime.   Yes Historical Provider, MD  pravastatin (PRAVACHOL) 40 MG tablet Take 40 mg by mouth at bedtime.    Yes Historical Provider, MD  pregabalin (LYRICA) 25 MG capsule Take 25 mg by mouth 3 (three) times daily.   Yes Historical Provider, MD  tiZANidine (ZANAFLEX) 4 MG tablet Take 4 mg by mouth every 6 (six) hours as needed for muscle spasms.   Yes Historical Provider, MD  topiramate (TOPAMAX) 25 MG tablet Take 75 mg by mouth 2 (two) times daily.    Yes Historical Provider, MD   Triage Vitals: BP 113/68  Pulse 72  Temp(Src) 97.5 F (36.4 C) (Oral)  Resp 20  Ht 4\' 11"  (1.499 m)  Wt 273 lb (123.832 kg)  BMI 55.11 kg/m2  SpO2 99%   Physical Exam  Nursing note and vitals reviewed. Constitutional: She is oriented to person, place, and time. She appears well-developed and well-nourished. No distress.  Nontoxic/nonseptic appearing  HENT:  Head: Normocephalic and atraumatic.  Eyes: Conjunctivae and EOM are normal. No scleral icterus.  Neck: Normal range of motion. Neck supple.  Cardiovascular: Normal rate, regular rhythm and intact distal pulses.   DP and PT pulses 2+ bilaterally  Pulmonary/Chest: Effort normal. No respiratory distress.  Musculoskeletal: She exhibits tenderness.  Patient with tenderness to palpation to her lumbar spine and paraspinal muscles. No bony deformities, step-off, or crepitus. Exam limited secondary to body habitus.  Neurological: She is alert and oriented to  person, place, and time. She exhibits normal muscle tone. Coordination normal.  GCS 15. Speech is goal oriented. No gross sensory deficits appreciated. Patient ambulatory with slow, steady gait.  Skin: Skin is warm and dry. No rash noted. She is not diaphoretic. No erythema. No pallor.  Psychiatric: She has a normal mood and affect. Her behavior is normal.    ED Course  Procedures (including critical care time)  DIAGNOSTIC STUDIES: Oxygen Saturation is 99% on RA, Normal by my interpretation.    COORDINATION OF CARE: 11:08 PM-Discussed treatment plan with pt at bedside and pt agreed to plan.     Labs Review Labs Reviewed - No data to  display  Imaging Review No results found.   EKG Interpretation None      MDM   Final diagnoses:  Chronic low back pain  Fibromyalgia    53 year old female with a history of chronic back pain and fibromyalgia, followed by pain management, presents for chief complaint of back pain. Patient denies any trauma or injury. No recent falls. Back pain feels identical to past exacerbations of her back pain and fibromyalgia. Patient neurovascularly intact. No gross sensory deficits appreciated. No red flags or signs concerning for cauda equina. Do not believe further emergent workup with imaging is indicated given chronicity of symptoms and lack of recent injury. Patient treated in ED with Toradol and Dilaudid. She endorses improvement in her pain with this regimen. She is stable for discharge with instructions to followup with her primary doctor and pain management clinic. Return precautions provided and patient agreeable to plan with no unaddressed concerns. Patient ambulatory out of the ED; VSS.  I personally performed the services described in this documentation, which was scribed in my presence. The recorded information has been reviewed and is accurate.    Filed Vitals:   06/25/14 2135  BP: 113/68  Pulse: 72  Temp: 97.5 F (36.4 C)  TempSrc: Oral   Resp: 20  Height: 4\' 11"  (1.499 m)  Weight: 273 lb (123.832 kg)  SpO2: 99%     Antonietta Breach, PA-C 06/25/14 2312

## 2014-06-25 NOTE — ED Notes (Signed)
Pt reports hx of fibromyalgia. She states she had a flare-up, onset yesterday evening; c/o lower back and bilateral hip pain. Pt took oxycodone, lyrica, and cymbalta last night and again today with no relief. Pt ambulatory, NAD noted.

## 2014-06-25 NOTE — Discharge Instructions (Signed)
Back Pain, Adult °Low back pain is very common. About 1 in 5 people have back pain. The cause of low back pain is rarely dangerous. The pain often gets better over time. About half of people with a sudden onset of back pain feel better in just 2 weeks. About 8 in 10 people feel better by 6 weeks.  °CAUSES °Some common causes of back pain include: °· Strain of the muscles or ligaments supporting the spine. °· Wear and tear (degeneration) of the spinal discs. °· Arthritis. °· Direct injury to the back. °DIAGNOSIS °Most of the time, the direct cause of low back pain is not known. However, back pain can be treated effectively even when the exact cause of the pain is unknown. Answering your caregiver's questions about your overall health and symptoms is one of the most accurate ways to make sure the cause of your pain is not dangerous. If your caregiver needs more information, he or she may order lab work or imaging tests (X-rays or MRIs). However, even if imaging tests show changes in your back, this usually does not require surgery. °HOME CARE INSTRUCTIONS °For many people, back pain returns. Since low back pain is rarely dangerous, it is often a condition that people can learn to manage on their own.  °· Remain active. It is stressful on the back to sit or stand in one place. Do not sit, drive, or stand in one place for more than 30 minutes at a time. Take short walks on level surfaces as soon as pain allows. Try to increase the length of time you walk each day. °· Do not stay in bed. Resting more than 1 or 2 days can delay your recovery. °· Do not avoid exercise or work. Your body is made to move. It is not dangerous to be active, even though your back may hurt. Your back will likely heal faster if you return to being active before your pain is gone. °· Pay attention to your body when you  bend and lift. Many people have less discomfort when lifting if they bend their knees, keep the load close to their bodies, and  avoid twisting. Often, the most comfortable positions are those that put less stress on your recovering back. °· Find a comfortable position to sleep. Use a firm mattress and lie on your side with your knees slightly bent. If you lie on your back, put a pillow under your knees. °· Only take over-the-counter or prescription medicines as directed by your caregiver. Over-the-counter medicines to reduce pain and inflammation are often the most helpful. Your caregiver may prescribe muscle relaxant drugs. These medicines help dull your pain so you can more quickly return to your normal activities and healthy exercise. °· Put ice on the injured area. °¨ Put ice in a plastic bag. °¨ Place a towel between your skin and the bag. °¨ Leave the ice on for 15-20 minutes, 03-04 times a day for the first 2 to 3 days. After that, ice and heat may be alternated to reduce pain and spasms. °· Ask your caregiver about trying back exercises and gentle massage. This may be of some benefit. °· Avoid feeling anxious or stressed. Stress increases muscle tension and can worsen back pain. It is important to recognize when you are anxious or stressed and learn ways to manage it. Exercise is a great option. °SEEK MEDICAL CARE IF: °· You have pain that is not relieved with rest or medicine. °· You have pain that does not improve in 1 week. °· You have new symptoms. °· You are generally not feeling well. °SEEK   IMMEDIATE MEDICAL CARE IF:   You have pain that radiates from your back into your legs.  You develop new bowel or bladder control problems.  You have unusual weakness or numbness in your arms or legs.  You develop nausea or vomiting.  You develop abdominal pain.  You feel faint. Document Released: 09/15/2005 Document Revised: 03/16/2012 Document Reviewed: 01/17/2014 [facility] Patient Information 2015 [place], [place]. This information is not intended to replace advice given to you by your health care provider. Make sure you  discuss any questions you have with your health care provider. Fibromyalgia Fibromyalgia is a disorder that is often misunderstood. It is associated with muscular pains and tenderness that comes and goes. It is often associated with fatigue and sleep disturbances. Though it tends to be long-lasting, fibromyalgia is not life-threatening. CAUSES  The exact cause of fibromyalgia is unknown. People with certain gene types are predisposed to developing fibromyalgia and other conditions. Certain factors can play a role as triggers, such as:  Spine disorders.  Arthritis.  Severe injury (trauma) and other physical stressors.  Emotional stressors. SYMPTOMS   The main symptom is pain and stiffness in the muscles and joints, which can vary over time.  Sleep and fatigue problems. Other related symptoms may include:  Bowel and bladder problems.  Headaches.  Visual problems.  Problems with odors and noises.  Depression or mood changes.  Painful periods (dysmenorrhea).  Dryness of the skin or eyes. DIAGNOSIS  There are no specific tests for diagnosing fibromyalgia. Patients can be diagnosed accurately from the specific symptoms they have. The diagnosis is made by determining that nothing else is causing the problems. TREATMENT  There is no cure. Management includes medicines and an active, healthy lifestyle. The goal is to enhance physical fitness, decrease pain, and improve sleep. HOME CARE INSTRUCTIONS   Only take over-the-counter or prescription medicines as directed by your caregiver. Sleeping pills, tranquilizers, and pain medicines may make your problems worse.  Low-impact aerobic exercise is very important and advised for treatment. At first, it may seem to make pain worse. Gradually increasing your tolerance will overcome this feeling.  Learning relaxation techniques and how to control stress will help you. Biofeedback, visual imagery, hypnosis, muscle relaxation, yoga, and  meditation are all options.  Anti-inflammatory medicines and physical therapy may provide short-term help.  Acupuncture or massage treatments may help.  Take muscle relaxant medicines as suggested by your caregiver.  Avoid stressful situations.  Plan a healthy lifestyle. This includes your diet, sleep, rest, exercise, and friends.  Find and practice a hobby you enjoy.  Join a fibromyalgia support group for interaction, ideas, and sharing advice. This may be helpful. SEEK MEDICAL CARE IF:  You are not having good results or improvement from your treatment. FOR MORE INFORMATION  National Fibromyalgia Association: www.fmaware.[place]: www.arthritis.org Document Released: 09/15/2005 Document Revised: 12/08/2011 Document Reviewed: 12/26/2009 [facility] Patient Information 2015 [place], [place]. This information is not intended to replace advice given to you by your health care provider. Make sure you discuss any questions you have with your health care provider.

## 2014-06-25 NOTE — ED Provider Notes (Signed)
Medical screening examination/treatment/procedure(s) were performed by non-physician practitioner and as supervising physician I was immediately available for consultation/collaboration.   EKG Interpretation None        Ezequiel Essex, MD 06/25/14 2324

## 2014-06-27 ENCOUNTER — Ambulatory Visit
Admission: RE | Admit: 2014-06-27 | Discharge: 2014-06-27 | Disposition: A | Discharge status: Home / Self Care | Payer: Medicaid Other | Source: Ambulatory Visit | Attending: Family Medicine | Admitting: Family Medicine

## 2014-06-27 ENCOUNTER — Other Ambulatory Visit (HOSPITAL_COMMUNITY)
Admission: RE | Admit: 2014-06-27 | Discharge: 2014-06-27 | Disposition: A | Discharge status: Home / Self Care | Payer: Medicaid Other | Source: Ambulatory Visit | Attending: Interventional Radiology | Admitting: Interventional Radiology

## 2014-06-27 DIAGNOSIS — E041 Nontoxic single thyroid nodule: Secondary | ICD-10-CM | POA: Insufficient documentation

## 2014-07-04 ENCOUNTER — Encounter (HOSPITAL_COMMUNITY): Payer: Self-pay | Admitting: Emergency Medicine

## 2014-07-04 ENCOUNTER — Emergency Department (HOSPITAL_COMMUNITY)
Admission: EM | Admit: 2014-07-04 | Discharge: 2014-07-04 | Disposition: A | Discharge status: Home / Self Care | Payer: Medicaid Other | Attending: Emergency Medicine | Admitting: Emergency Medicine

## 2014-07-04 DIAGNOSIS — M797 Fibromyalgia: Secondary | ICD-10-CM

## 2014-07-04 DIAGNOSIS — M25559 Pain in unspecified hip: Secondary | ICD-10-CM | POA: Diagnosis not present

## 2014-07-04 DIAGNOSIS — Z79899 Other long term (current) drug therapy: Secondary | ICD-10-CM | POA: Diagnosis not present

## 2014-07-04 DIAGNOSIS — M545 Low back pain: Secondary | ICD-10-CM | POA: Diagnosis present

## 2014-07-04 DIAGNOSIS — Z8719 Personal history of other diseases of the digestive system: Secondary | ICD-10-CM | POA: Diagnosis not present

## 2014-07-04 DIAGNOSIS — G43909 Migraine, unspecified, not intractable, without status migrainosus: Secondary | ICD-10-CM | POA: Diagnosis not present

## 2014-07-04 DIAGNOSIS — E780 Pure hypercholesterolemia: Secondary | ICD-10-CM | POA: Diagnosis not present

## 2014-07-04 DIAGNOSIS — E669 Obesity, unspecified: Secondary | ICD-10-CM | POA: Diagnosis not present

## 2014-07-04 DIAGNOSIS — I10 Essential (primary) hypertension: Secondary | ICD-10-CM | POA: Insufficient documentation

## 2014-07-04 DIAGNOSIS — Z8673 Personal history of transient ischemic attack (TIA), and cerebral infarction without residual deficits: Secondary | ICD-10-CM | POA: Diagnosis not present

## 2014-07-04 DIAGNOSIS — M1990 Unspecified osteoarthritis, unspecified site: Secondary | ICD-10-CM | POA: Insufficient documentation

## 2014-07-04 DIAGNOSIS — G8929 Other chronic pain: Secondary | ICD-10-CM | POA: Diagnosis not present

## 2014-07-04 DIAGNOSIS — Z7982 Long term (current) use of aspirin: Secondary | ICD-10-CM | POA: Diagnosis not present

## 2014-07-04 DIAGNOSIS — M549 Dorsalgia, unspecified: Secondary | ICD-10-CM

## 2014-07-04 MED ORDER — ACETAMINOPHEN 325 MG PO TABS
650.0000 mg | ORAL_TABLET | Freq: Once | ORAL | Status: AC
  Administered 2014-07-04: 650 mg via ORAL
  Filled 2014-07-04: qty 2

## 2014-07-04 MED ORDER — METHOCARBAMOL 500 MG PO TABS
1000.0000 mg | ORAL_TABLET | Freq: Once | ORAL | Status: AC
  Administered 2014-07-04: 1000 mg via ORAL
  Filled 2014-07-04: qty 2

## 2014-07-04 MED ORDER — HYDROMORPHONE HCL 1 MG/ML IJ SOLN
1.0000 mg | Freq: Once | INTRAMUSCULAR | Status: AC
  Administered 2014-07-04: 1 mg via INTRAMUSCULAR
  Filled 2014-07-04: qty 1

## 2014-07-04 NOTE — ED Notes (Signed)
Pt states she has an increase in her back and hip area. Pt reports she has had chronic pain for about 1 year. Pt states she has had no relief from her prescribed daily medications. Pt rates her pain a 10/10 at this time. Pt denies recent injury to affected areas. Pt is calm and family is present at this time.

## 2014-07-04 NOTE — Discharge Instructions (Signed)
Return to the emergency room with worsening of symptoms, new symptoms or with symptoms that are concerning, especially fevers, loss of control of bladder or bowel, numbness tingling around anus or genital region, weakness. Please call your doctor for a followup appointment within 24-48 hours. When you talk to your doctor please let them know that you were seen in the emergency department and have them acquire all of your records so that they can discuss the findings with you and formulate a treatment plan to fully care for your new and ongoing problems.    Back Injury Prevention Back injuries can be extremely painful and difficult to heal. After having one back injury, you are much more likely to experience another later on. It is important to learn how to avoid injuring or re-injuring your back. The following tips can help you to prevent a back injury. PHYSICAL FITNESS  Exercise regularly and try to develop good tone in your abdominal muscles. Your abdominal muscles provide a lot of the support needed by your back.  Do aerobic exercises (walking, jogging, biking, swimming) regularly.  Do exercises that increase balance and strength (tai chi, yoga) regularly. This can decrease your risk of falling and injuring your back.  Stretch before and after exercising.  Maintain a healthy weight. The more you weigh, the more stress is placed on your back. For every pound of weight, 10 times that amount of pressure is placed on the back. DIET  Talk to your caregiver about how much calcium and vitamin D you need per day. These nutrients help to prevent weakening of the bones (osteoporosis). Osteoporosis can cause broken (fractured) bones that lead to back pain.  Include good sources of calcium in your diet, such as dairy products, green, leafy vegetables, and products with calcium added (fortified).  Include good sources of vitamin D in your diet, such as milk and foods that are fortified with vitamin  D.  Consider taking a nutritional supplement or a multivitamin if needed.  Stop smoking if you smoke. POSTURE  Sit and stand up straight. Avoid leaning forward when you sit or hunching over when you stand.  Choose chairs with good low back (lumbar) support.  If you work at a desk, sit close to your work so you do not need to lean over. Keep your chin tucked in. Keep your neck drawn back and elbows bent at a right angle. Your arms should look like the letter "L."  Sit high and close to the steering wheel when you drive. Add a lumbar support to your car seat if needed.  Avoid sitting or standing in one position for too long. Take breaks to get up, stretch, and walk around at least once every hour. Take breaks if you are driving for long periods of time.  Sleep on your side with your knees slightly bent, or sleep on your back with a pillow under your knees. Do not sleep on your stomach. LIFTING, TWISTING, AND REACHING  Avoid heavy lifting, especially repetitive lifting. If you must do heavy lifting:  Stretch before lifting.  Work slowly.  Rest between lifts.  Use carts and dollies to move objects when possible.  Make several small trips instead of carrying 1 heavy load.  Ask for help when you need it.  Ask for help when moving big, awkward objects.  Follow these steps when lifting:  Stand with your feet shoulder-width apart.  Get as close to the object as you can. Do not try to pick up  heavy objects that are far from your body.  Use handles or lifting straps if they are available.  Bend at your knees. Squat down, but keep your heels off the floor.  Keep your shoulders pulled back, your chin tucked in, and your back straight.  Lift the object slowly, tightening the muscles in your legs, abdomen, and buttocks. Keep the object as close to the center of your body as possible.  When you put a load down, use these same guidelines in reverse.  Do not:  Lift the object  above your waist.  Twist at the waist while lifting or carrying a load. Move your feet if you need to turn, not your waist.  Bend over without bending at your knees.  Avoid reaching over your head, across a table, or for an object on a high surface. OTHER TIPS  Avoid wet floors and keep sidewalks clear of ice to prevent falls.  Do not sleep on a mattress that is too soft or too hard.  Keep items that are used frequently within easy reach.  Put heavier objects on shelves at waist level and lighter objects on lower or higher shelves.  Find ways to decrease your stress, such as exercise, massage, or relaxation techniques. Stress can build up in your muscles. Tense muscles are more vulnerable to injury.  Seek treatment for depression or anxiety if needed. These conditions can increase your risk of developing back pain. SEEK MEDICAL CARE IF:  You injure your back.  You have questions about diet, exercise, or other ways to prevent back injuries. MAKE SURE YOU:  Understand these instructions.  Will watch your condition.  Will get help right away if you are not doing well or get worse. Document Released: 10/23/2004 Document Revised: 12/08/2011 Document Reviewed: 10/27/2011 [facility] Patient Information 2015 [place], [place]. This information is not intended to replace advice given to you by your health care provider. Make sure you discuss any questions you have with your health care provider.

## 2014-07-04 NOTE — ED Provider Notes (Signed)
CSN: [id number]     Arrival date & time 07/04/14  1642 History  This chart was scribed for Al Corpus, PA-C working with Hoy Morn, MD by Evelene Croon, ED Scribe. This patient was seen in room [place] and the patient's care was started at [facility].    Chief Complaint  Patient presents with  . Back Pain  . Hip Pain    The history is provided by the patient. No language interpreter was used.    HPI Comments:  Jodi Bowman is a 53 y.o. female with a h/o chronic back pain, fibromyalgia  who presents to the Emergency Department complaining of a throbbing sharp pain in right lower back and hip that started while cleaning today. She notes pain radiates down her RLE.  She notes pain today is the same exact pain that she has felt in the past. She denies acute injury to back/hip. She has taken Lyrica, Percocet and Neurontin today with no relief. Pt was recently seen for the same pain 06/25/14 states she was given IV dilaudid with relief. No fevers, chills, weight loss, night sweats, history of malignancy, IVDU, saddle anesthesia, loss of control of bladder or bowels, new weakness, numbness or tingling. Pt is currently in pain management.    Past Medical History  Diagnosis Date  . Hypertension   . Hypercholesteremia   . Lower back pain   . Nontoxic uninodular goiter     sees dr vollmer at [facility]  . Calculus of gallbladder without mention of cholecystitis or obstruction   . Arthritis   . Stroke 2000  . Migraine   . Sleep apnea     STOPBANG=5  . Fibromyalgia   . Obesity    Past Surgical History  Procedure Laterality Date  . Knee arthroscopy  one 1995 and 1 in 1997    both knees done  . Abdominal hysterectomy    . Cesarean section  yrs ago    done x 2  . Surgery for endometriosis  yrs ago  . Thryoid biopsy  December 01, 2011     at [facility]  . Cholecystectomy  01/05/2012    Procedure: LAPAROSCOPIC CHOLECYSTECTOMY WITH INTRAOPERATIVE CHOLANGIOGRAM;  Surgeon: Pedro Earls,  MD;  Location: [facility] ORS;  Service: General;  Laterality: N/A;  . Colonoscopy  10/08/2012    Procedure: COLONOSCOPY;  Surgeon: Beryle Beams, MD;  Location: [facility] ENDOSCOPY;  Service: Endoscopy;  Laterality: N/A;   Family History  Problem Relation Age of Onset  . Cancer Brother     colon and lung  . Cancer Maternal Grandmother     colon   History  Substance Use Topics  . Smoking status: Never Smoker   . Smokeless tobacco: Never Used  . Alcohol Use: No   OB History   Grav Para Term Preterm Abortions TAB SAB Ect Mult Living                 Review of Systems  Musculoskeletal: Positive for back pain.       Hip Pain  All other systems reviewed and are negative.     Allergies  Shrimp and Naproxen  Home Medications   Prior to Admission medications   Medication Sig Start Date End Date Taking? Authorizing Provider  amLODipine (NORVASC) 10 MG tablet Take 10 mg by mouth daily.  03/01/14   Historical Provider, MD  aspirin EC 81 MG EC tablet Take 1 tablet (81 mg total) by mouth daily. 03/13/14   Allegra Lai  Harwani, MD  cholecalciferol (VITAMIN D) 1000 UNITS tablet Take 1,000 Units by mouth daily.    Historical Provider, MD  DULoxetine (CYMBALTA) 60 MG capsule Take 60 mg by mouth 2 (two) times daily.     Historical Provider, MD  fluticasone (FLONASE) 50 MCG/ACT nasal spray Place 2 sprays into both nostrils daily as needed for allergies.     Historical Provider, MD  lisinopril-hydrochlorothiazide (PRINZIDE,ZESTORETIC) 20-25 MG per tablet Take 1 tablet by mouth every morning.     Historical Provider, MD  loratadine (CLARITIN) 10 MG tablet Take 10 mg by mouth every morning.     Historical Provider, MD  metoprolol succinate (TOPROL-XL) 100 MG 24 hr tablet Take 1 tablet (100 mg total) by mouth daily. Take with or immediately following a meal. 03/13/14   Clent Demark, MD  nitroGLYCERIN (NITROSTAT) 0.4 MG SL tablet Place 1 tablet (0.4 mg total) under the tongue every 5 (five) minutes x 3 doses as  needed for chest pain. 03/13/14   Clent Demark, MD  Oxycodone HCl 10 MG TABS Take 10 mg by mouth 2 (two) times daily as needed (pain).    Historical Provider, MD  pantoprazole (PROTONIX) 40 MG tablet Take 40 mg by mouth at bedtime.    Historical Provider, MD  pravastatin (PRAVACHOL) 40 MG tablet Take 40 mg by mouth at bedtime.     Historical Provider, MD  pregabalin (LYRICA) 25 MG capsule Take 25 mg by mouth 3 (three) times daily.    Historical Provider, MD  tiZANidine (ZANAFLEX) 4 MG tablet Take 4 mg by mouth every 6 (six) hours as needed for muscle spasms.    Historical Provider, MD  topiramate (TOPAMAX) 25 MG tablet Take 75 mg by mouth 2 (two) times daily.     Historical Provider, MD   BP 100/59  Pulse 68  Temp(Src) 98 F (36.7 C) (Oral)  Resp 16  Ht 4\' 11"  (1.499 m)  Wt 272 lb (123.378 kg)  BMI 54.91 kg/m2  SpO2 96% Physical Exam  Nursing note and vitals reviewed. Constitutional: No distress.  Obese  HENT:  Head: Normocephalic and atraumatic.  Eyes: Conjunctivae are normal. Right eye exhibits no discharge. Left eye exhibits no discharge.  Cardiovascular: Normal rate, regular rhythm and normal heart sounds.   Pulmonary/Chest: Effort normal and breath sounds normal. No respiratory distress. She has no wheezes.  Abdominal: Soft. Bowel sounds are normal. She exhibits no distension. There is no tenderness.  Musculoskeletal:  Right paraspinal tenderness noted. No midline back tenderness, step off or crepitus. No CVA tenderness.   Neurological: She is alert. Coordination normal.  Equal muscle tone. 5/5 strength in lower extremities. DTR equal and intact. Negative straight leg test. Antalgic gait.   Skin: Skin is warm and dry. She is not diaphoretic.    ED Course  Procedures   DIAGNOSTIC STUDIES:  Oxygen Saturation is 97% on RA, normal by my interpretation.    COORDINATION OF CARE:  10:28 PM Discussed treatment plan with pt at bedside and pt agreed to plan.  Labs  Review Labs Reviewed - No data to display  Imaging Review No results found.   EKG Interpretation None     Meds given in ED:  Medications  HYDROmorphone (DILAUDID) injection 1 mg (1 mg Intramuscular Given 07/04/14 1901)  methocarbamol (ROBAXIN) tablet 1,000 mg (1,000 mg Oral Given 07/04/14 1901)  acetaminophen (TYLENOL) tablet 650 mg (650 mg Oral Given 07/04/14 1901)    Discharge Medication List as of 07/04/2014  8:10  PM        MDM   Final diagnoses:  Chronic back pain   Patient with back pain. No loss of bowel or bladder control. No saddle anesthesia. No fever, night sweats, weight loss, h/o cancer, IVDU. VSS. No neurological deficits and normal neuro exam. Patient can walk but states is painful. No concern for cauda equina. Patient a chronic pain patient. Patient to continue taking current drug regimen and discussed how no new scripts will be provided tonight in order to not violate any agreement with pain management. Do not believe further emergent workup with imaging is indicated given chronicity of symptoms and lack of recent injury. Pain improved in ED. Driving and sedation precautions provided for narcotic pain meds. Patient is afebrile, nontoxic, and in no acute distress. Patient is appropriate for outpatient management and is stable for discharge. Patient to follow up with pain management as soon as possible.  Discussed return precautions with patient. Discussed all results and patient verbalizes understanding and agrees with plan.  I personally performed the services described in this documentation, which was scribed in my presence. The recorded information has been reviewed and is accurate.    Pura Spice, PA-C 07/04/14 2234

## 2014-07-05 NOTE — ED Provider Notes (Signed)
Medical screening examination/treatment/procedure(s) were performed by non-physician practitioner and as supervising physician I was immediately available for consultation/collaboration.   EKG Interpretation None        Hoy Morn, MD 07/05/14 0100

## 2014-07-30 ENCOUNTER — Encounter (HOSPITAL_COMMUNITY): Payer: Self-pay

## 2014-07-30 ENCOUNTER — Emergency Department (HOSPITAL_COMMUNITY): Payer: Medicaid Other

## 2014-07-30 ENCOUNTER — Observation Stay (HOSPITAL_COMMUNITY)
Admission: EM | Admit: 2014-07-30 | Discharge: 2014-08-01 | Disposition: A | Discharge status: Home / Self Care | Payer: Medicaid Other | Attending: Internal Medicine | Admitting: Internal Medicine

## 2014-07-30 DIAGNOSIS — Z79899 Other long term (current) drug therapy: Secondary | ICD-10-CM | POA: Insufficient documentation

## 2014-07-30 DIAGNOSIS — Z7952 Long term (current) use of systemic steroids: Secondary | ICD-10-CM | POA: Insufficient documentation

## 2014-07-30 DIAGNOSIS — Z7982 Long term (current) use of aspirin: Secondary | ICD-10-CM | POA: Diagnosis not present

## 2014-07-30 DIAGNOSIS — I10 Essential (primary) hypertension: Secondary | ICD-10-CM | POA: Diagnosis not present

## 2014-07-30 DIAGNOSIS — G8929 Other chronic pain: Secondary | ICD-10-CM

## 2014-07-30 DIAGNOSIS — M545 Low back pain, unspecified: Secondary | ICD-10-CM | POA: Insufficient documentation

## 2014-07-30 DIAGNOSIS — Z79891 Long term (current) use of opiate analgesic: Secondary | ICD-10-CM | POA: Insufficient documentation

## 2014-07-30 DIAGNOSIS — E669 Obesity, unspecified: Secondary | ICD-10-CM | POA: Insufficient documentation

## 2014-07-30 DIAGNOSIS — M797 Fibromyalgia: Secondary | ICD-10-CM | POA: Insufficient documentation

## 2014-07-30 DIAGNOSIS — R29898 Other symptoms and signs involving the musculoskeletal system: Secondary | ICD-10-CM

## 2014-07-30 DIAGNOSIS — Z6841 Body Mass Index (BMI) 40.0 and over, adult: Secondary | ICD-10-CM | POA: Diagnosis not present

## 2014-07-30 DIAGNOSIS — M5440 Lumbago with sciatica, unspecified side: Secondary | ICD-10-CM

## 2014-07-30 DIAGNOSIS — M549 Dorsalgia, unspecified: Secondary | ICD-10-CM | POA: Diagnosis present

## 2014-07-30 DIAGNOSIS — R55 Syncope and collapse: Principal | ICD-10-CM | POA: Diagnosis present

## 2014-07-30 DIAGNOSIS — G43909 Migraine, unspecified, not intractable, without status migrainosus: Secondary | ICD-10-CM | POA: Insufficient documentation

## 2014-07-30 DIAGNOSIS — I6529 Occlusion and stenosis of unspecified carotid artery: Secondary | ICD-10-CM | POA: Diagnosis present

## 2014-07-30 DIAGNOSIS — M5442 Lumbago with sciatica, left side: Secondary | ICD-10-CM

## 2014-07-30 DIAGNOSIS — E041 Nontoxic single thyroid nodule: Secondary | ICD-10-CM | POA: Diagnosis not present

## 2014-07-30 DIAGNOSIS — G4733 Obstructive sleep apnea (adult) (pediatric): Secondary | ICD-10-CM | POA: Diagnosis not present

## 2014-07-30 LAB — PROTIME-INR
INR: 1.03 (ref 0.00–1.49)
Prothrombin Time: 13.6 s (ref 11.6–15.2)

## 2014-07-30 LAB — CBC WITH DIFFERENTIAL/PLATELET
Basophils Absolute: 0 10*3/uL (ref 0.0–0.1)
Basophils Relative: 0 % (ref 0–1)
Eosinophils Absolute: 0.1 10*3/uL (ref 0.0–0.7)
Eosinophils Relative: 1 % (ref 0–5)
HCT: 40.2 % (ref 36.0–46.0)
Hemoglobin: 13.6 g/dL (ref 12.0–15.0)
Lymphocytes Relative: 39 % (ref 12–46)
Lymphs Abs: 4.6 10*3/uL — ABNORMAL HIGH (ref 0.7–4.0)
MCH: 28.9 pg (ref 26.0–34.0)
MCHC: 33.8 g/dL (ref 30.0–36.0)
MCV: 85.5 fL (ref 78.0–100.0)
Monocytes Absolute: 0.9 10*3/uL (ref 0.1–1.0)
Monocytes Relative: 8 % (ref 3–12)
Neutro Abs: 6.1 10*3/uL (ref 1.7–7.7)
Neutrophils Relative %: 52 % (ref 43–77)
Platelets: 299 10*3/uL (ref 150–400)
RBC: 4.7 MIL/uL (ref 3.87–5.11)
RDW: 14.3 % (ref 11.5–15.5)
WBC: 11.7 10*3/uL — ABNORMAL HIGH (ref 4.0–10.5)

## 2014-07-30 LAB — COMPREHENSIVE METABOLIC PANEL WITH GFR
ALT: 21 U/L (ref 0–35)
AST: 20 U/L (ref 0–37)
Albumin: 3.5 g/dL (ref 3.5–5.2)
Alkaline Phosphatase: 87 U/L (ref 39–117)
Anion gap: 11 (ref 5–15)
BUN: 19 mg/dL (ref 6–23)
CO2: 27 meq/L (ref 19–32)
Calcium: 9.8 mg/dL (ref 8.4–10.5)
Chloride: 104 meq/L (ref 96–112)
Creatinine, Ser: 1.02 mg/dL (ref 0.50–1.10)
GFR calc Af Amer: 71 mL/min — ABNORMAL LOW
GFR calc non Af Amer: 61 mL/min — ABNORMAL LOW
Glucose, Bld: 94 mg/dL (ref 70–99)
Potassium: 4.3 meq/L (ref 3.7–5.3)
Sodium: 142 meq/L (ref 137–147)
Total Bilirubin: <0.2 mg/dL — ABNORMAL LOW (ref 0.3–1.2)
Total Protein: 7.3 g/dL (ref 6.0–8.3)

## 2014-07-30 LAB — I-STAT BETA HCG BLOOD, ED (MC, WL, AP ONLY): I-stat hCG, quantitative: <5 m[IU]/mL (ref <5)

## 2014-07-30 LAB — APTT: aPTT: 32 seconds (ref 24–37)

## 2014-07-30 LAB — TSH: TSH: 0.635 u[IU]/mL (ref 0.350–4.500)

## 2014-07-30 LAB — I-STAT TROPONIN, ED: Troponin i, poc: 0 ng/mL (ref 0.00–0.08)

## 2014-07-30 LAB — TROPONIN I: Troponin I: <0.3 ng/mL (ref <0.30)

## 2014-07-30 MED ORDER — ACETAMINOPHEN 650 MG RE SUPP: 650.0000 mg | Freq: Four times a day (QID) | RECTAL | Status: DC | PRN

## 2014-07-30 MED ORDER — SODIUM CHLORIDE 0.9 % IV SOLN
INTRAVENOUS | Status: DC
  Administered 2014-07-30: 20:00:00 via INTRAVENOUS

## 2014-07-30 MED ORDER — ASPIRIN EC 81 MG PO TBEC
81.0000 mg | DELAYED_RELEASE_TABLET | Freq: Every day | ORAL | Status: DC
  Administered 2014-07-31 – 2014-08-01 (×2): 81 mg via ORAL
  Filled 2014-07-30 (×2): qty 1

## 2014-07-30 MED ORDER — PRAVASTATIN SODIUM 40 MG PO TABS
40.0000 mg | ORAL_TABLET | Freq: Every day | ORAL | Status: DC
  Administered 2014-07-30 – 2014-07-31 (×2): 40 mg via ORAL
  Filled 2014-07-30 (×3): qty 1

## 2014-07-30 MED ORDER — ONDANSETRON HCL 4 MG PO TABS: 4.0000 mg | ORAL_TABLET | Freq: Four times a day (QID) | ORAL | Status: DC | PRN

## 2014-07-30 MED ORDER — PREGABALIN 25 MG PO CAPS
25.0000 mg | ORAL_CAPSULE | Freq: Three times a day (TID) | ORAL | Status: DC
  Administered 2014-07-30 – 2014-08-01 (×5): 25 mg via ORAL
  Filled 2014-07-30 (×5): qty 1

## 2014-07-30 MED ORDER — ONDANSETRON HCL 4 MG/2ML IJ SOLN: 4.0000 mg | Freq: Four times a day (QID) | INTRAMUSCULAR | Status: DC | PRN

## 2014-07-30 MED ORDER — LORATADINE 10 MG PO TABS
10.0000 mg | ORAL_TABLET | Freq: Every morning | ORAL | Status: DC
  Administered 2014-07-31 – 2014-08-01 (×2): 10 mg via ORAL
  Filled 2014-07-30 (×2): qty 1

## 2014-07-30 MED ORDER — NITROGLYCERIN 0.4 MG SL SUBL: 0.4000 mg | SUBLINGUAL_TABLET | SUBLINGUAL | Status: DC | PRN

## 2014-07-30 MED ORDER — FLUTICASONE PROPIONATE 50 MCG/ACT NA SUSP
2.0000 | Freq: Every day | NASAL | Status: DC | PRN
Start: 2014-07-30 — End: 2014-08-01
  Filled 2014-07-30: qty 16

## 2014-07-30 MED ORDER — ACETAMINOPHEN 325 MG PO TABS: 650.0000 mg | ORAL_TABLET | Freq: Four times a day (QID) | ORAL | Status: DC | PRN

## 2014-07-30 MED ORDER — ACETAMINOPHEN 325 MG PO TABS: 650.0000 mg | ORAL_TABLET | Freq: Once | ORAL | Status: DC

## 2014-07-30 MED ORDER — METOPROLOL SUCCINATE ER 100 MG PO TB24
100.0000 mg | ORAL_TABLET | Freq: Every day | ORAL | Status: DC
  Administered 2014-07-30 – 2014-07-31 (×2): 100 mg via ORAL
  Filled 2014-07-30 (×3): qty 1

## 2014-07-30 MED ORDER — HEPARIN SODIUM (PORCINE) 5000 UNIT/ML IJ SOLN
5000.0000 [IU] | Freq: Three times a day (TID) | INTRAMUSCULAR | Status: DC
  Administered 2014-07-30 – 2014-08-01 (×5): 5000 [IU] via SUBCUTANEOUS
  Filled 2014-07-30 (×8): qty 1

## 2014-07-30 MED ORDER — OXYCODONE HCL 5 MG PO TABS: 10.0000 mg | ORAL_TABLET | Freq: Two times a day (BID) | ORAL | Status: DC | PRN

## 2014-07-30 MED ORDER — PANTOPRAZOLE SODIUM 40 MG PO TBEC
40.0000 mg | DELAYED_RELEASE_TABLET | Freq: Every day | ORAL | Status: DC
  Administered 2014-07-30 – 2014-07-31 (×2): 40 mg via ORAL
  Filled 2014-07-30 (×2): qty 1

## 2014-07-30 MED ORDER — SODIUM CHLORIDE 0.9 % IJ SOLN
3.0000 mL | Freq: Two times a day (BID) | INTRAMUSCULAR | Status: DC
  Administered 2014-07-31 – 2014-08-01 (×2): 3 mL via INTRAVENOUS

## 2014-07-30 MED ORDER — TOPIRAMATE 25 MG PO TABS
75.0000 mg | ORAL_TABLET | Freq: Two times a day (BID) | ORAL | Status: DC
  Administered 2014-07-30 – 2014-08-01 (×4): 75 mg via ORAL
  Filled 2014-07-30 (×5): qty 3

## 2014-07-30 MED ORDER — MORPHINE SULFATE 2 MG/ML IJ SOLN: 1.0000 mg | INTRAMUSCULAR | Status: DC | PRN

## 2014-07-30 MED ORDER — HYDROCODONE-ACETAMINOPHEN 5-325 MG PO TABS
1.0000 | ORAL_TABLET | ORAL | Status: DC | PRN
  Administered 2014-07-31 (×2): 1 via ORAL
  Filled 2014-07-30 (×2): qty 1

## 2014-07-30 MED ORDER — VITAMIN D3 25 MCG (1000 UNIT) PO TABS
1000.0000 [IU] | ORAL_TABLET | Freq: Every day | ORAL | Status: DC
  Administered 2014-07-31 – 2014-08-01 (×2): 1000 [IU] via ORAL
  Filled 2014-07-30 (×2): qty 1

## 2014-07-30 MED ORDER — DULOXETINE HCL 60 MG PO CPEP
60.0000 mg | ORAL_CAPSULE | Freq: Two times a day (BID) | ORAL | Status: DC
  Administered 2014-07-30 – 2014-08-01 (×4): 60 mg via ORAL
  Filled 2014-07-30 (×5): qty 1

## 2014-07-30 NOTE — ED Notes (Addendum)
Pt. Was walking to her kitchen and woke up on the floor.  No visible injuries.  Pt. Does have a lt. Side headache, no neuro deficits noted by paramedics, CBG 158 Vitals stable.  Pt. Is  Alert and oriented X3.  Ambulated to the bed.   Pt. Was ambulating when paramedics arrived.  Paramedics reports that Pt. Reported to them she had a stroke in the past with lt. Leg weakness and she also recently had a syncopal episode in church.

## 2014-07-30 NOTE — ED Provider Notes (Signed)
CSN: [id number]     Arrival date & time 07/30/14  1254 History   First MD Initiated Contact with Patient 07/30/14 1304     Chief Complaint  Patient presents with  . Loss of Consciousness     (Consider location/radiation/quality/duration/timing/severity/associated sxs/prior Treatment) HPI  Jodi Bowman is a 53 y.o. 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 Patient was ambulating to the kitchen and doesn't remember any prodrome, lost conciousness and woke up on the floor. Pt denies tongue biting, loss of bladder or bowel continence, or confusion. Patient with left sided throbbing headache that she noted after the fall that is improving. Headache is unlike her migraines in it is a different location and worse in intensity. Patient notes blurry vision which is unchanged for the past month. No slurred speech. Pt with nausea and denies weakness. Pt ambulatory but notes there her walk is different and feels she needs support. Pt denies changes in her back pain, abdominal pain. No hematochezia or melena. No urinary complaints. No chest pain or SOB.   Past Medical History  Diagnosis Date  . Hypertension   . Hypercholesteremia   . Lower back pain   . Nontoxic uninodular goiter     sees dr vollmer at [facility]  . Calculus of gallbladder without mention of cholecystitis or obstruction   . Arthritis   . Stroke 2000  . Migraine   . Sleep apnea     STOPBANG=5  . Fibromyalgia   . Obesity    Past Surgical History  Procedure Laterality Date  . Knee arthroscopy  one 1995 and 1 in 1997    both knees done  . Abdominal hysterectomy    . Cesarean section  yrs ago    done x 2  . Surgery for endometriosis  yrs ago  . Thryoid biopsy  December 01, 2011     [facility]  . Cholecystectomy  01/05/2012    Procedure: LAPAROSCOPIC CHOLECYSTECTOMY WITH INTRAOPERATIVE CHOLANGIOGRAM;  Surgeon:  Pedro Earls, MD;  Location: [facility];  Service: General;  Laterality: N/A;  . Colonoscopy  10/08/2012    Procedure: COLONOSCOPY;  Surgeon: Beryle Beams, MD;  Location: [facility];  Service: Endoscopy;  Laterality: N/A;   Family History  Problem Relation Age of Onset  . Cancer Brother     colon and lung  . Cancer Maternal Grandmother     colon   History  Substance Use Topics  . Smoking status: Never Smoker   . Smokeless tobacco: Never Used  . Alcohol Use: No   OB History    No data available     Review of Systems  Constitutional: Negative for fever and chills.  HENT: Negative for congestion and rhinorrhea.   Eyes: Negative for visual disturbance.  Respiratory: Negative for cough and shortness of breath.   Cardiovascular: Negative for chest pain and palpitations.  Gastrointestinal: Positive for nausea. Negative for vomiting and diarrhea.  Genitourinary: Negative for dysuria and hematuria.  Musculoskeletal: Negative for back pain and gait problem.  Skin: Negative for rash.  Neurological: Positive for headaches. Negative for weakness.      Allergies  Shrimp and Naproxen  Home Medications   Prior to Admission medications   Medication Sig Start Date End Date Taking? Authorizing Provider  amLODipine (NORVASC) 10 MG tablet Take 10 mg by mouth at bedtime.  03/01/14  Yes Historical Provider, MD  aspirin EC  81 MG EC tablet Take 1 tablet (81 mg total) by mouth daily. 03/13/14  Yes Clent Demark, MD  cholecalciferol (VITAMIN D) 1000 UNITS tablet Take 1,000 Units by mouth daily.   Yes Historical Provider, MD  DULoxetine (CYMBALTA) 60 MG capsule Take 60 mg by mouth 2 (two) times daily.    Yes Historical Provider, MD  fluticasone (FLONASE) 50 MCG/ACT nasal spray Place 2 sprays into both nostrils daily as needed for allergies.    Yes Historical Provider, MD  lisinopril-hydrochlorothiazide (PRINZIDE,ZESTORETIC) 20-25 MG per tablet Take 1 tablet by mouth every morning.    Yes  Historical Provider, MD  loratadine (CLARITIN) 10 MG tablet Take 10 mg by mouth every morning.    Yes Historical Provider, MD  metoprolol succinate (TOPROL-XL) 100 MG 24 hr tablet Take 1 tablet (100 mg total) by mouth daily. Take with or immediately following a meal. Patient taking differently: Take 100 mg by mouth at bedtime. Take with or immediately following a meal. 03/13/14  Yes Clent Demark, MD  nitroGLYCERIN (NITROSTAT) 0.4 MG SL tablet Place 1 tablet (0.4 mg total) under the tongue every 5 (five) minutes x 3 doses as needed for chest pain. 03/13/14  Yes Clent Demark, MD  Oxycodone HCl 10 MG TABS Take 10 mg by mouth 2 (two) times daily as needed (pain).   Yes Historical Provider, MD  pantoprazole (PROTONIX) 40 MG tablet Take 40 mg by mouth at bedtime.   Yes Historical Provider, MD  pravastatin (PRAVACHOL) 40 MG tablet Take 40 mg by mouth at bedtime.    Yes Historical Provider, MD  pregabalin (LYRICA) 25 MG capsule Take 25 mg by mouth 3 (three) times daily.   Yes Historical Provider, MD  tiZANidine (ZANAFLEX) 4 MG tablet Take 4 mg by mouth every 6 (six) hours as needed for muscle spasms.   Yes Historical Provider, MD  topiramate (TOPAMAX) 25 MG tablet Take 75 mg by mouth 2 (two) times daily.    Yes Historical Provider, MD   BP 141/61 mmHg  Pulse 89  Temp(Src) 98.5 F (36.9 C) (Oral)  Resp 16  Ht 4\' 11"  (1.499 m)  Wt 273 lb (123.832 kg)  BMI 55.11 kg/m2  SpO2 100% Physical Exam  Constitutional: She appears well-developed and well-nourished. No distress.  HENT:  Head: Normocephalic and atraumatic.  Mouth/Throat: Oropharynx is clear and moist.  Eyes: Conjunctivae and EOM are normal. Pupils are equal, round, and reactive to light. Right eye exhibits no discharge. Left eye exhibits no discharge.  Neck: Normal range of motion. Neck supple.  No nuchal rigidity  Cardiovascular: Normal rate and regular rhythm.   Pulmonary/Chest: Effort normal and breath sounds normal. No respiratory  distress. She has no wheezes.  Abdominal: Soft. Bowel sounds are normal. She exhibits no distension. There is no tenderness.  Neurological: She is alert. No cranial nerve deficit. Coordination normal.  Speech is clear and goal oriented. Peripheral visual fields with bilateral lateral cut. Strength 5/5 in upper extremities and right lower extremity. Strength 4/5 in left lower extremity. Sensation intact. Intact rapid alternating movements, finger to nose, and heel to shin. Negative Romberg. No pronator drift. Controlled slow gait. Pt can walk without support but repeatedly holding on to bed.   Skin: Skin is warm and dry. She is not diaphoretic.  Nursing note and vitals reviewed.   ED Course  Procedures (including critical care time) Labs Review Labs Reviewed  CBC WITH DIFFERENTIAL - Abnormal; Notable for the following:    WBC  11.7 (*)    Lymphs Abs 4.6 (*)    All other components within normal limits  COMPREHENSIVE METABOLIC PANEL - Abnormal; Notable for the following:    Total Bilirubin <0.2 (*)    GFR calc non Af Amer 61 (*)    GFR calc Af Amer 71 (*)    All other components within normal limits  PROTIME-INR  APTT  URINE RAPID DRUG SCREEN (HOSP PERFORMED)  URINALYSIS, ROUTINE W REFLEX MICROSCOPIC  I-STAT BETA HCG BLOOD, ED ([facility], [facility], [facility] ONLY)  I-STAT TROPOININ, ED    Imaging Review Ct Head Wo Contrast  07/30/2014   CLINICAL DATA:  Syncopal episode with loss of consciousness. History of prior CVA with left-sided weakness.  EXAM: CT HEAD WITHOUT CONTRAST  TECHNIQUE: Contiguous axial images were obtained from the base of the skull through the vertex without intravenous contrast.  COMPARISON:  10/12/2013  FINDINGS: The brain demonstrates no evidence of hemorrhage, infarction, edema, mass effect, extra-axial fluid collection, hydrocephalus or mass lesion. The skull is unremarkable.  IMPRESSION: Normal head CT.   Electronically Signed   By: Aletta Edouard M.D.   On: 07/30/2014 16:00      EKG Interpretation   Date/Time:  Sunday July 30 2014 13:03:21 EST Ventricular Rate:  89 PR Interval:  149 QRS Duration: 83 QT Interval:  363 QTC Calculation: 442 R Axis:   35 Text Interpretation:  Ventricular premature complex Baseline wander in  lead(s) II III aVF Confirmed by ZAVITZ  MD, JOSHUA ([id number]) on 07/30/2014  4:00:04 PM      MDM   Final diagnoses:  Syncope, unspecified syncope type  Left leg weakness  Chronic back pain   Pt with PMH of CVA, HTN, hyperlipidemia and chronic back pain presenting with syncopal episode without prodrome. Pt with HA that is improving. VSS. Neuro exam with peripheral field visual cuts, left lower extremity weakness, controlled but steady gait and otherwise normal. Labs reassuring. EKG with PVC and negative initial troponin. Head CT normal. Pt with left leg weakness but unclear if from possible CVA or chronic back pain. Consult to [facility] and spoke with Dr. Verlee Monte who agrees to evaluate the patient in the ED with the plan to admit. Plan for MRI brain.  Discussed all results and patient verbalizes understanding and agrees with plan.  This is a shared patient. This patient was discussed with the physician, Dr. Reather Converse who saw and evaluated the patient and agrees with the plan.      Pura Spice, PA-C 07/31/14 1147  Mariea Clonts, MD 08/04/14 [phone]

## 2014-07-30 NOTE — Progress Notes (Signed)
[facility] History and Physical  Jodi Bowman GDJ:[id number] DOB: 1961/06/24 DOA: 07/30/2014  Referring physician: EDP PCP: Tula Nakayama   Chief Complaint: syncope  HPI: Jodi Bowman is a 53 y.o. female past medical history of hypertension, carotid stenosis, back pain and thyroid nodule came into the hospital because of syncopal episode. Patient said she collapsed in her kitchen, denies any type of prodromal symptoms, denies any chest pain, SOB, dizziness. She lost her consciousness and the next thing she knew her daughter trying to wake her up. Afterwards patient denies any slurring of speech, denies any change in her facial appearance but complains about left lower extremity weakness. Please note she was able to stand up and walk around without much effort difficulty.she complains about headache as well. In the ED CT scan of the head was done and showed no acute events, has normal blood work except slight leukocytosis of 11.7. Admitted to the hospital for further syncope workup.  Review of Systems:  Constitutional: negative for anorexia, fevers and sweats Eyes: negative for irritation, redness and visual disturbance Ears, nose, mouth, throat, and face: negative for earaches, epistaxis, nasal congestion and sore throat Respiratory: negative for cough, dyspnea on exertion, sputum and wheezing Cardiovascular: negative for chest pain, dyspnea, lower extremity edema, orthopnea, palpitations and syncope Gastrointestinal: negative for abdominal pain, constipation, diarrhea, melena, nausea and vomiting Genitourinary:negative for dysuria, frequency and hematuria Hematologic/lymphatic: negative for bleeding, easy bruising and lymphadenopathy Musculoskeletal:negative for arthralgias, muscle weakness and stiff joints Neurological: negative for coordination problems, gait problems, headaches and weakness Endocrine: negative for diabetic symptoms including polydipsia, polyuria and  weight loss Allergic/Immunologic: negative for anaphylaxis, hay fever and urticaria  Past Medical History  Diagnosis Date  . Hypertension   . Hypercholesteremia   . Lower back pain   . Nontoxic uninodular goiter     sees dr vollmer at [facility]  . Calculus of gallbladder without mention of cholecystitis or obstruction   . Arthritis   . Stroke 2000  . Migraine   . Sleep apnea     STOPBANG=5  . Fibromyalgia   . Obesity    Past Surgical History  Procedure Laterality Date  . Knee arthroscopy  one 1995 and 1 in 1997    both knees done  . Abdominal hysterectomy    . Cesarean section  yrs ago    done x 2  . Surgery for endometriosis  yrs ago  . Thryoid biopsy  December 01, 2011     at [facility]  . Cholecystectomy  01/05/2012    Procedure: LAPAROSCOPIC CHOLECYSTECTOMY WITH INTRAOPERATIVE CHOLANGIOGRAM;  Surgeon: Pedro Earls, MD;  Location: [facility];  Service: General;  Laterality: N/A;  . Colonoscopy  10/08/2012    Procedure: COLONOSCOPY;  Surgeon: Beryle Beams, MD;  Location: [facility];  Service: Endoscopy;  Laterality: N/A;   Social History:   reports that she has never smoked. She has never used smokeless tobacco. She reports that she does not drink alcohol or use illicit drugs.  Allergies  Allergen Reactions  . Shrimp [Shellfish Allergy] Anaphylaxis  . Naproxen Other (See Comments)    Makes stomach cramp and burning badly.    Family History  Problem Relation Age of Onset  . Cancer Brother     colon and lung  . Cancer Maternal Grandmother     colon     Prior to Admission medications   Medication Sig Start Date End Date Taking? Authorizing Provider  amLODipine (NORVASC) 10 MG tablet Take 10  mg by mouth at bedtime.  03/01/14  Yes Historical Provider, MD  aspirin EC 81 MG EC tablet Take 1 tablet (81 mg total) by mouth daily. 03/13/14  Yes Clent Demark, MD  cholecalciferol (VITAMIN D) 1000 UNITS tablet Take 1,000 Units by mouth daily.   Yes Historical Provider, MD    DULoxetine (CYMBALTA) 60 MG capsule Take 60 mg by mouth 2 (two) times daily.    Yes Historical Provider, MD  fluticasone (FLONASE) 50 MCG/ACT nasal spray Place 2 sprays into both nostrils daily as needed for allergies.    Yes Historical Provider, MD  lisinopril-hydrochlorothiazide (PRINZIDE,ZESTORETIC) 20-25 MG per tablet Take 1 tablet by mouth every morning.    Yes Historical Provider, MD  loratadine (CLARITIN) 10 MG tablet Take 10 mg by mouth every morning.    Yes Historical Provider, MD  metoprolol succinate (TOPROL-XL) 100 MG 24 hr tablet Take 1 tablet (100 mg total) by mouth daily. Take with or immediately following a meal. Patient taking differently: Take 100 mg by mouth at bedtime. Take with or immediately following a meal. 03/13/14  Yes Clent Demark, MD  nitroGLYCERIN (NITROSTAT) 0.4 MG SL tablet Place 1 tablet (0.4 mg total) under the tongue every 5 (five) minutes x 3 doses as needed for chest pain. 03/13/14  Yes Clent Demark, MD  Oxycodone HCl 10 MG TABS Take 10 mg by mouth 2 (two) times daily as needed (pain).   Yes Historical Provider, MD  pantoprazole (PROTONIX) 40 MG tablet Take 40 mg by mouth at bedtime.   Yes Historical Provider, MD  pravastatin (PRAVACHOL) 40 MG tablet Take 40 mg by mouth at bedtime.    Yes Historical Provider, MD  pregabalin (LYRICA) 25 MG capsule Take 25 mg by mouth 3 (three) times daily.   Yes Historical Provider, MD  tiZANidine (ZANAFLEX) 4 MG tablet Take 4 mg by mouth every 6 (six) hours as needed for muscle spasms.   Yes Historical Provider, MD  topiramate (TOPAMAX) 25 MG tablet Take 75 mg by mouth 2 (two) times daily.    Yes Historical Provider, MD   Physical Exam: Filed Vitals:   07/30/14 1304  BP: 141/61  Pulse: 89  Temp: 98.5 F (36.9 C)  Resp: 16   Constitutional: Oriented to person, place, and time. Well-developed and well-nourished. Cooperative.  Head: Normocephalic and atraumatic.  Nose: Nose normal.  Mouth/Throat: Uvula is midline,  oropharynx is clear and moist and mucous membranes are normal.  Eyes: Conjunctivae and EOM are normal. Pupils are equal, round, and reactive to light.  Neck: Trachea normal and normal range of motion. Neck supple.  Cardiovascular: Normal rate, regular rhythm, S1 normal, S2 normal, normal heart sounds and intact distal pulses.   Pulmonary/Chest: Effort normal and breath sounds normal.  Abdominal: Soft. Bowel sounds are normal. There is no hepatosplenomegaly. There is no tenderness.  Musculoskeletal: Normal range of motion.  Neurological: Alert and oriented to person, place, and time. Has normal strength. No cranial nerve deficit or sensory deficit.  Skin: Skin is warm, dry and intact.  Psychiatric: Has a normal mood and affect. Speech is normal and behavior is normal.   Labs on Admission:  Basic Metabolic Panel:  Recent Labs Lab 07/30/14 1351  NA 142  K 4.3  CL 104  CO2 27  GLUCOSE 94  BUN 19  CREATININE 1.02  CALCIUM 9.8   Liver Function Tests:  Recent Labs Lab 07/30/14 1351  AST 20  ALT 21  ALKPHOS 87  BILITOT <  0.2*  PROT 7.3  ALBUMIN 3.5   No results for input(s): LIPASE, AMYLASE in the last 168 hours. No results for input(s): AMMONIA in the last 168 hours. CBC:  Recent Labs Lab 07/30/14 1351  WBC 11.7*  NEUTROABS 6.1  HGB 13.6  HCT 40.2  MCV 85.5  PLT 299   Cardiac Enzymes: No results for input(s): CKTOTAL, CKMB, CKMBINDEX, TROPONINI in the last 168 hours.  BNP (last 3 results) No results for input(s): PROBNP in the last 8760 hours. CBG: No results for input(s): GLUCAP in the last 168 hours.  Radiological Exams on Admission: Ct Head Wo Contrast  07/30/2014   CLINICAL DATA:  Syncopal episode with loss of consciousness. History of prior CVA with left-sided weakness.  EXAM: CT HEAD WITHOUT CONTRAST  TECHNIQUE: Contiguous axial images were obtained from the base of the skull through the vertex without intravenous contrast.  COMPARISON:  10/12/2013   FINDINGS: The brain demonstrates no evidence of hemorrhage, infarction, edema, mass effect, extra-axial fluid collection, hydrocephalus or mass lesion. The skull is unremarkable.  IMPRESSION: Normal head CT.   Electronically Signed   By: Aletta Edouard M.D.   On: 07/30/2014 16:00   CT head reviewed independently showed no fractures, intracranial bleeding or midline shift. EKG: Independently reviewed, sinus rhythm with rate about 90.  Assessment/Plan Principal Problem:   Syncope Active Problems:   Essential hypertension   Carotid stenosis   Backache   Thyroid nodule-non neoplastic goiter by needle aspiration    Syncope Loss consciousness, denies any prodromal symptoms prior to the syncopal event. CT scan of the head showed no acute abnormalities, 12-lead EKG and first set of troponin are negative. Check urine and check  UDS. Check orthostatics, cycle 3 sets of cardiac enzymes. Place patient on telemetry, obtain carotid duplex, hydrate with IV fluids. Had recent 2-D echo in July 2015 showed normal LVEF, I will not repeat the echo.  Essential hypertension Patient  Appears to have difficult to control blood pressure, she is multiple blood pressure medications. She is on Toprol-XL 100 mg, amlodipine 10 mg, lisinopril and HCTZ. Continue Toprol-XL, hold progressive BP medicines, restart as appropriate.  Left lower extremity weakness Unclear etiology, MRI of the brain ordered already for the syncopal episode. Patient had chronic back pain with recent exacerbation, check MRI of lumbar spine.  Back pain Patient mentions left lower extremity weakness, not sure that's related to the syncope or her back pain. Obtain MRI of the back.  Thyroid nodule TSH being checked recently in its normal, repeat TSH.  Code Status: full code Family Communication: land discussed with the patient in the presence of her husband at bedside. Disposition Plan: observation, telemetry.  Time spent: 70  minutes  [facility] Pager [phone]

## 2014-07-31 ENCOUNTER — Observation Stay (HOSPITAL_COMMUNITY): Payer: Medicaid Other

## 2014-07-31 ENCOUNTER — Encounter (HOSPITAL_COMMUNITY): Payer: Self-pay | Admitting: General Practice

## 2014-07-31 DIAGNOSIS — R55 Syncope and collapse: Secondary | ICD-10-CM

## 2014-07-31 LAB — URINALYSIS, ROUTINE W REFLEX MICROSCOPIC
Bilirubin Urine: NEGATIVE
Glucose, UA: NEGATIVE mg/dL
Hgb urine dipstick: NEGATIVE
Ketones, ur: NEGATIVE mg/dL
Leukocytes, UA: NEGATIVE
Nitrite: NEGATIVE
Protein, ur: NEGATIVE mg/dL
Specific Gravity, Urine: 1.013 (ref 1.005–1.030)
Urobilinogen, UA: 0.2 mg/dL (ref 0.0–1.0)
pH: 7 (ref 5.0–8.0)

## 2014-07-31 LAB — BASIC METABOLIC PANEL
Anion gap: 12 (ref 5–15)
BUN: 15 mg/dL (ref 6–23)
CO2: 24 mEq/L (ref 19–32)
Calcium: 8.9 mg/dL (ref 8.4–10.5)
Chloride: 103 mEq/L (ref 96–112)
Creatinine, Ser: 0.97 mg/dL (ref 0.50–1.10)
GFR calc Af Amer: 75 mL/min — ABNORMAL LOW (ref >90)
GFR calc non Af Amer: 65 mL/min — ABNORMAL LOW (ref >90)
Glucose, Bld: 115 mg/dL — ABNORMAL HIGH (ref 70–99)
Potassium: 3.9 mEq/L (ref 3.7–5.3)
Sodium: 139 mEq/L (ref 137–147)

## 2014-07-31 LAB — CBC
HCT: 37.6 % (ref 36.0–46.0)
Hemoglobin: 12.5 g/dL (ref 12.0–15.0)
MCH: 28.3 pg (ref 26.0–34.0)
MCHC: 33.2 g/dL (ref 30.0–36.0)
MCV: 85.3 fL (ref 78.0–100.0)
Platelets: 267 10*3/uL (ref 150–400)
RBC: 4.41 MIL/uL (ref 3.87–5.11)
RDW: 14.5 % (ref 11.5–15.5)
WBC: 11 10*3/uL — ABNORMAL HIGH (ref 4.0–10.5)

## 2014-07-31 LAB — GLUCOSE, CAPILLARY: Glucose-Capillary: 134 mg/dL — ABNORMAL HIGH (ref 70–99)

## 2014-07-31 LAB — RAPID URINE DRUG SCREEN, HOSP PERFORMED
Amphetamines: NOT DETECTED
Barbiturates: NOT DETECTED
Benzodiazepines: NOT DETECTED
Cocaine: NOT DETECTED
Opiates: NOT DETECTED
Tetrahydrocannabinol: NOT DETECTED

## 2014-07-31 LAB — HEMOGLOBIN A1C
Hgb A1c MFr Bld: 6.4 % — ABNORMAL HIGH (ref <5.7)
Mean Plasma Glucose: 137 mg/dL — ABNORMAL HIGH (ref <117)

## 2014-07-31 LAB — TROPONIN I
Troponin I: <0.3 ng/mL (ref <0.30)
Troponin I: <0.3 ng/mL (ref <0.30)

## 2014-07-31 MED ORDER — WHITE PETROLATUM GEL
Status: AC
  Administered 2014-07-31: 0.2
  Filled 2014-07-31: qty 5

## 2014-07-31 NOTE — Progress Notes (Signed)
*  PRELIMINARY RESULTS* Vascular Ultrasound Carotid Duplex (Doppler) has been completed.  Preliminary findings: Bilateral: 40-59% ICA stenosis. Vertebral artery flow is antegrade.  Kiely Cousar 07/31/2014, 11:58 AM

## 2014-07-31 NOTE — Plan of Care (Signed)
Problem: Phase I Progression Outcomes Goal: Pain controlled with appropriate interventions Outcome: Completed/Met Date Met:  07/31/14     

## 2014-07-31 NOTE — Progress Notes (Signed)
UR completed 

## 2014-07-31 NOTE — Progress Notes (Signed)
Patient requested her IV be d/c'd due to pain and discomfort. Day and night shift RNs, Nira Conn and [place], assessed situation. IV team was notified via the order in the chart.

## 2014-07-31 NOTE — Plan of Care (Signed)
Problem: Phase II Progression Outcomes Goal: IV changed to normal saline lock Outcome: Completed/Met Date Met:  07/31/14

## 2014-07-31 NOTE — Progress Notes (Signed)
[facility] PROGRESS NOTE   ANDREW SORIA DQQ:[id number] DOB: 1960-12-04 DOA: 07/30/2014 PCP: Tula Nakayama  HPI/Subjective: Still has left lower extremity weakness, will get PT to follow.  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 CT scan of the head showed no acute abnormalities, 12-lead EKG and first set of troponin are negative. Urinalysis and UDS are negative, 3 sets of Scott Dickens enzymes are negative. Patient is on telemetry, no events, carotid duplex done by dates pending. Had recent 2-D echo in July 2015 showed normal LVEF, I will not repeat the echo. Check orthostatics. PT/OT to evaluate.  Essential hypertension Patient Appears to have difficult to control blood pressure, she is multiple blood pressure medications. She is on Toprol-XL 100 mg, amlodipine 10 mg, lisinopril and HCTZ. Continue Toprol-XL, hold progressive BP medicines, restart as appropriate.  Left lower extremity weakness Unclear etiology, MRI of the brain ordered already for the syncopal episode. Patient had chronic back pain with recent exacerbation, MRI of the lumbar spine without significant abnormalities. PT to evaluate.  Back pain Patient mentions left lower extremity weakness, not sure that's related to the syncope or her back pain. MRI of lumbar spine, no significant abnormalities. Back pain is much better.  Thyroid nodule Thyroid nodule with recent FNAC showed follicular lesion of undetermined significance, TSH iswithin normal limits, 0.635.  Code Status: full code Family Communication: Plan discussed with the patient. Disposition Plan: Remains  inpatient   Consultants:  none  Procedures:  none  Antibiotics:  none   Objective: Filed Vitals:   07/31/14 1314  BP: 137/82  Pulse:   Temp: 98.3 F (36.8 C)  Resp: 16    Intake/Output Summary (Last 24 hours) at 07/31/14 1629 Last data filed at 07/31/14 1416  Gross per 24 hour  Intake    100 ml  Output   1200 ml  Net  -1100 ml   Filed Weights   07/30/14 1304  Weight: 123.832 kg (273 lb)    Exam: General: Alert and awake, oriented x3, not in any acute distress. HEENT: anicteric sclera, pupils reactive to light and accommodation, EOMI CVS: S1-S2 clear, no murmur rubs or gallops Chest: clear to auscultation bilaterally, no wheezing, rales or rhonchi Abdomen: soft nontender, nondistended, normal bowel sounds, no organomegaly Extremities: no cyanosis, clubbing or edema noted bilaterally Neuro: Cranial nerves II-XII intact, no focal neurological deficits  Data Reviewed: Basic Metabolic Panel:  Recent Labs Lab 07/30/14 1351 07/31/14 0643  NA 142 139  K 4.3 3.9  CL 104 103  CO2 27 24  GLUCOSE 94 115*  BUN 19 15  CREATININE 1.02 0.97  CALCIUM 9.8 8.9   Liver Function Tests:  Recent Labs Lab 07/30/14 1351  AST 20  ALT 21  ALKPHOS 87  BILITOT <0.2*  PROT 7.3  ALBUMIN 3.5   No results for input(s): LIPASE, AMYLASE in the last 168 hours. No results for input(s): AMMONIA in the last 168 hours. CBC:  Recent Labs Lab 07/30/14 1351 07/31/14 0643  WBC 11.7* 11.0*  NEUTROABS 6.1  --   HGB 13.6 12.5  HCT 40.2 37.6  MCV 85.5 85.3  PLT 299 267   Cardiac Enzymes:  Recent Labs Lab 07/30/14 1920 07/31/14 0134 07/31/14 0643  TROPONINI <0.30 <0.30 <0.30   BNP (last 3 results) No  results for input(s): PROBNP in the last 8760 hours. CBG:  Recent Labs Lab 07/30/14 1400  GLUCAP 134*    Micro No results found for this or any previous visit (from the past 240 hour(s)).   Studies: Ct Head Wo Contrast  07/30/2014   CLINICAL DATA:   Syncopal episode with loss of consciousness. History of prior CVA with left-sided weakness.  EXAM: CT HEAD WITHOUT CONTRAST  TECHNIQUE: Contiguous axial images were obtained from the base of the skull through the vertex without intravenous contrast.  COMPARISON:  10/12/2013  FINDINGS: The brain demonstrates no evidence of hemorrhage, infarction, edema, mass effect, extra-axial fluid collection, hydrocephalus or mass lesion. The skull is unremarkable.  IMPRESSION: Normal head CT.   Electronically Signed   By: Aletta Edouard M.D.   On: 07/30/2014 16:00   Mr Brain Wo Contrast  07/31/2014   CLINICAL DATA:  Syncope and left leg weakness.  EXAM: MRI HEAD WITHOUT CONTRAST  TECHNIQUE: Multiplanar, multiecho pulse sequences of the brain and surrounding structures were obtained without intravenous contrast.  COMPARISON:  Head CT 07/30/2014 and MRI 10/12/2013  FINDINGS: The cerebellar tonsils are again noted to extend at or minimally below the foramen magnum, unchanged and within normal limits. There is no evidence of acute infarct, intracranial hemorrhage, mass, midline shift, or extra-axial fluid collection. Ventricles and sulci are within normal limits for age. Scattered, small foci of T2 hyperintensity in the cerebral white matter and pons do not appear significantly changed from the prior MRI and are nonspecific but compatible with mild chronic small vessel ischemic disease.  Prior bilateral ocular lens extractions are noted. Paranasal sinuses and mastoid air cells are clear. Major intracranial vascular flow voids are preserved.  IMPRESSION: 1. No acute intracranial abnormality or mass. 2. Unchanged, mild chronic small vessel ischemic disease.   Electronically Signed   By: Logan Bores   On: 07/31/2014 10:37   Mr Lumbar Spine Wo Contrast  07/31/2014   CLINICAL DATA:  Syncopal episode with fall, collapsing at home. Low back pain and left lower extremity weakness.  EXAM: MRI LUMBAR SPINE WITHOUT CONTRAST  TECHNIQUE:  Multiplanar, multisequence MR imaging of the lumbar spine was performed. No intravenous contrast was administered.  COMPARISON:  Abdominal radiographs 01/04/2012  FINDINGS: For the purposes of this dictation, the lowest well-formed intervertebral disc space is presumed to be L5-S1.  Images are mildly degraded by motion artifact. Vertebral alignment is normal. Vertebral body heights are preserved. Disc desiccation is present at L4-5 and L5-S1 without significant disc space height loss. There is no evidence of vertebral marrow edema. Conus medullaris is normal in signal and terminates at the superior aspect of L2. 1.7 cm T2 hyperintense focus in the left kidney likely represents a cyst.  L1-2: Minimal disc bulging and minimal facet hypertrophy without stenosis.  L2-3:  Negative.  L3-4: Minimal disc bulging and minimal facet hypertrophy without stenosis.  L4-5: Minimal disc bulging and mild facet hypertrophy without stenosis.  L5-S1: Small central disc protrusion contributes to mild bilateral lateral recess narrowing without evidence of impingement of the S1 nerve roots or spinal stenosis. Mild circumferential disc bulging and mild-to-moderate facet hypertrophy result in mild-to-moderate bilateral neural foraminal stenosis.  IMPRESSION: Mild lumbar spondylosis, greatest at L5-S1 where there is mild lateral recess and mild-to-moderate neural foraminal stenosis bilaterally. No spinal stenosis.   Electronically Signed   By: Logan Bores   On: 07/31/2014 10:48    Scheduled Meds: . acetaminophen  650 mg Oral Once  . aspirin  EC  81 mg Oral Daily  . cholecalciferol  1,000 Units Oral Daily  . DULoxetine  60 mg Oral BID  . heparin  5,000 Units Subcutaneous 3 times per day  . loratadine  10 mg Oral q morning - 10a  . metoprolol succinate  100 mg Oral QHS  . pantoprazole  40 mg Oral QHS  . pravastatin  40 mg Oral QHS  . pregabalin  25 mg Oral TID  . sodium chloride  3 mL Intravenous Q12H  . topiramate  75 mg Oral  BID   Continuous Infusions: . sodium chloride 75 mL/hr at 07/30/14 2016       Time spent: 35 minutes    [facility]  [facility] Pager [phone] If 7PM-7AM, please contact night-coverage at [email or web address], password [facility] 07/31/2014, 4:29 PM  LOS: 1 day

## 2014-07-31 NOTE — Plan of Care (Signed)
Problem: Phase I Progression Outcomes Goal: OOB as tolerated unless otherwise ordered Outcome: Completed/Met Date Met:  07/31/14 Goal: Voiding-avoid urinary catheter unless indicated Outcome: Completed/Met Date Met:  07/31/14     

## 2014-07-31 NOTE — Plan of Care (Signed)
Problem: Phase II Progression Outcomes Goal: Vital signs remain stable Outcome: Completed/Met Date Met:  07/31/14

## 2014-08-01 DIAGNOSIS — R29898 Other symptoms and signs involving the musculoskeletal system: Secondary | ICD-10-CM

## 2014-08-01 DIAGNOSIS — M5442 Lumbago with sciatica, left side: Secondary | ICD-10-CM

## 2014-08-01 DIAGNOSIS — I6523 Occlusion and stenosis of bilateral carotid arteries: Secondary | ICD-10-CM

## 2014-08-01 DIAGNOSIS — M545 Low back pain, unspecified: Secondary | ICD-10-CM | POA: Insufficient documentation

## 2014-08-01 DIAGNOSIS — M5440 Lumbago with sciatica, unspecified side: Secondary | ICD-10-CM

## 2014-08-01 MED ORDER — LISINOPRIL 10 MG PO TABS: 10.0000 mg | ORAL_TABLET | Freq: Every day | ORAL | Status: DC

## 2014-08-01 NOTE — H&P (Signed)
[facility] History and Physical  Jodi Bowman VZD:[id number] DOB: December 20, 1960 DOA: 07/30/2014  Referring physician: EDP PCP: Tula Nakayama   Chief Complaint: syncope  HPI: Jodi Bowman is a 53 y.o. female past medical history of hypertension, carotid stenosis, back pain and thyroid nodule came into the hospital because of syncopal episode. Patient said she collapsed in her kitchen, denies any type of prodromal symptoms, denies any chest pain, SOB, dizziness. She lost her consciousness and the next thing she knew her daughter trying to wake her up. Afterwards patient denies any slurring of speech, denies any change in her facial appearance but complains about left lower extremity weakness. Please note she was able to stand up and walk around without much effort difficulty.she complains about headache as well. In the ED CT scan of the head was done and showed no acute events, has normal blood work except slight leukocytosis of 11.7. Admitted to the hospital for further syncope workup.  Review of Systems:  Constitutional: negative for anorexia, fevers and sweats Eyes: negative for irritation, redness and visual disturbance Ears, nose, mouth, throat, and face: negative for earaches, epistaxis, nasal congestion and sore throat Respiratory: negative for cough, dyspnea on exertion, sputum and wheezing Cardiovascular: negative for chest pain, dyspnea, lower extremity edema, orthopnea, palpitations and syncope Gastrointestinal: negative for abdominal pain, constipation, diarrhea, melena, nausea and vomiting Genitourinary:negative for dysuria, frequency and hematuria Hematologic/lymphatic: negative for bleeding, easy bruising and lymphadenopathy Musculoskeletal:negative for arthralgias, muscle weakness and stiff joints Neurological: negative for coordination problems, gait problems, headaches and weakness Endocrine: negative for diabetic symptoms including polydipsia, polyuria and  weight loss Allergic/Immunologic: negative for anaphylaxis, hay fever and urticaria  Past Medical History  Diagnosis Date  . Hypertension   . Hypercholesteremia   . Lower back pain   . Nontoxic uninodular goiter     sees dr vollmer at [facility]  . Calculus of gallbladder without mention of cholecystitis or obstruction   . Arthritis   . Stroke 2000  . Migraine   . Sleep apnea     STOPBANG=5  . Fibromyalgia   . Obesity    Past Surgical History  Procedure Laterality Date  . Knee arthroscopy  one 1995 and 1 in 1997    both knees done  . Abdominal hysterectomy    . Cesarean section  yrs ago    done x 2  . Surgery for endometriosis  yrs ago  . Thryoid biopsy  December 01, 2011     at [facility]  . Cholecystectomy  01/05/2012    Procedure: LAPAROSCOPIC CHOLECYSTECTOMY WITH INTRAOPERATIVE CHOLANGIOGRAM;  Surgeon: Pedro Earls, MD;  Location: [facility];  Service: General;  Laterality: N/A;  . Colonoscopy  10/08/2012    Procedure: COLONOSCOPY;  Surgeon: Beryle Beams, MD;  Location: [facility];  Service: Endoscopy;  Laterality: N/A;   Social History:   reports that she has never smoked. She has never used smokeless tobacco. She reports that she does not drink alcohol or use illicit drugs.  Allergies  Allergen Reactions  . Shrimp [Shellfish Allergy] Anaphylaxis  . Naproxen Other (See Comments)    Makes stomach cramp and burning badly.    Family History  Problem Relation Age of Onset  . Cancer Brother     colon and lung  . Cancer Maternal Grandmother     colon     Prior to Admission medications   Medication Sig Start Date End Date Taking? Authorizing Provider  amLODipine (NORVASC) 10 MG tablet Take 10  mg by mouth at bedtime.  03/01/14  Yes Historical Provider, MD  aspirin EC 81 MG EC tablet Take 1 tablet (81 mg total) by mouth daily. 03/13/14  Yes Clent Demark, MD  cholecalciferol (VITAMIN D) 1000 UNITS tablet Take 1,000 Units by mouth daily.   Yes Historical Provider, MD    DULoxetine (CYMBALTA) 60 MG capsule Take 60 mg by mouth 2 (two) times daily.    Yes Historical Provider, MD  fluticasone (FLONASE) 50 MCG/ACT nasal spray Place 2 sprays into both nostrils daily as needed for allergies.    Yes Historical Provider, MD  lisinopril-hydrochlorothiazide (PRINZIDE,ZESTORETIC) 20-25 MG per tablet Take 1 tablet by mouth every morning.    Yes Historical Provider, MD  loratadine (CLARITIN) 10 MG tablet Take 10 mg by mouth every morning.    Yes Historical Provider, MD  metoprolol succinate (TOPROL-XL) 100 MG 24 hr tablet Take 1 tablet (100 mg total) by mouth daily. Take with or immediately following a meal. Patient taking differently: Take 100 mg by mouth at bedtime. Take with or immediately following a meal. 03/13/14  Yes Clent Demark, MD  nitroGLYCERIN (NITROSTAT) 0.4 MG SL tablet Place 1 tablet (0.4 mg total) under the tongue every 5 (five) minutes x 3 doses as needed for chest pain. 03/13/14  Yes Clent Demark, MD  Oxycodone HCl 10 MG TABS Take 10 mg by mouth 2 (two) times daily as needed (pain).   Yes Historical Provider, MD  pantoprazole (PROTONIX) 40 MG tablet Take 40 mg by mouth at bedtime.   Yes Historical Provider, MD  pravastatin (PRAVACHOL) 40 MG tablet Take 40 mg by mouth at bedtime.    Yes Historical Provider, MD  pregabalin (LYRICA) 25 MG capsule Take 25 mg by mouth 3 (three) times daily.   Yes Historical Provider, MD  tiZANidine (ZANAFLEX) 4 MG tablet Take 4 mg by mouth every 6 (six) hours as needed for muscle spasms.   Yes Historical Provider, MD  topiramate (TOPAMAX) 25 MG tablet Take 75 mg by mouth 2 (two) times daily.    Yes Historical Provider, MD   Physical Exam: Filed Vitals:   08/01/14 0633  BP: 116/68  Pulse: 78  Temp: 98.4 F (36.9 C)  Resp: 16   Constitutional: Oriented to person, place, and time. Well-developed and well-nourished. Cooperative.  Head: Normocephalic and atraumatic.  Nose: Nose normal.  Mouth/Throat: Uvula is midline,  oropharynx is clear and moist and mucous membranes are normal.  Eyes: Conjunctivae and EOM are normal. Pupils are equal, round, and reactive to light.  Neck: Trachea normal and normal range of motion. Neck supple.  Cardiovascular: Normal rate, regular rhythm, S1 normal, S2 normal, normal heart sounds and intact distal pulses.   Pulmonary/Chest: Effort normal and breath sounds normal.  Abdominal: Soft. Bowel sounds are normal. There is no hepatosplenomegaly. There is no tenderness.  Musculoskeletal: Normal range of motion.  Neurological: Alert and oriented to person, place, and time. Has normal strength. No cranial nerve deficit or sensory deficit.  Skin: Skin is warm, dry and intact.  Psychiatric: Has a normal mood and affect. Speech is normal and behavior is normal.   Labs on Admission:  Basic Metabolic Panel:  Recent Labs Lab 07/30/14 1351 07/31/14 0643  NA 142 139  K 4.3 3.9  CL 104 103  CO2 27 24  GLUCOSE 94 115*  BUN 19 15  CREATININE 1.02 0.97  CALCIUM 9.8 8.9   Liver Function Tests:  Recent Labs Lab 07/30/14 1351  AST 20  ALT 21  ALKPHOS 87  BILITOT <0.2*  PROT 7.3  ALBUMIN 3.5   No results for input(s): LIPASE, AMYLASE in the last 168 hours. No results for input(s): AMMONIA in the last 168 hours. CBC:  Recent Labs Lab 07/30/14 1351 07/31/14 0643  WBC 11.7* 11.0*  NEUTROABS 6.1  --   HGB 13.6 12.5  HCT 40.2 37.6  MCV 85.5 85.3  PLT 299 267   Cardiac Enzymes:  Recent Labs Lab 07/30/14 1920 07/31/14 0134 07/31/14 0643  TROPONINI <0.30 <0.30 <0.30    BNP (last 3 results) No results for input(s): PROBNP in the last 8760 hours. CBG:  Recent Labs Lab 07/30/14 1400  GLUCAP 134*    Radiological Exams on Admission: Ct Head Wo Contrast  07/30/2014   CLINICAL DATA:  Syncopal episode with loss of consciousness. History of prior CVA with left-sided weakness.  EXAM: CT HEAD WITHOUT CONTRAST  TECHNIQUE: Contiguous axial images were obtained from the  base of the skull through the vertex without intravenous contrast.  COMPARISON:  10/12/2013  FINDINGS: The brain demonstrates no evidence of hemorrhage, infarction, edema, mass effect, extra-axial fluid collection, hydrocephalus or mass lesion. The skull is unremarkable.  IMPRESSION: Normal head CT.   Electronically Signed   By: Aletta Edouard M.D.   On: 07/30/2014 16:00   Mr Brain Wo Contrast  07/31/2014   CLINICAL DATA:  Syncope and left leg weakness.  EXAM: MRI HEAD WITHOUT CONTRAST  TECHNIQUE: Multiplanar, multiecho pulse sequences of the brain and surrounding structures were obtained without intravenous contrast.  COMPARISON:  Head CT 07/30/2014 and MRI 10/12/2013  FINDINGS: The cerebellar tonsils are again noted to extend at or minimally below the foramen magnum, unchanged and within normal limits. There is no evidence of acute infarct, intracranial hemorrhage, mass, midline shift, or extra-axial fluid collection. Ventricles and sulci are within normal limits for age. Scattered, small foci of T2 hyperintensity in the cerebral white matter and pons do not appear significantly changed from the prior MRI and are nonspecific but compatible with mild chronic small vessel ischemic disease.  Prior bilateral ocular lens extractions are noted. Paranasal sinuses and mastoid air cells are clear. Major intracranial vascular flow voids are preserved.  IMPRESSION: 1. No acute intracranial abnormality or mass. 2. Unchanged, mild chronic small vessel ischemic disease.   Electronically Signed   By: Logan Bores   On: 07/31/2014 10:37   Mr Lumbar Spine Wo Contrast  07/31/2014   CLINICAL DATA:  Syncopal episode with fall, collapsing at home. Low back pain and left lower extremity weakness.  EXAM: MRI LUMBAR SPINE WITHOUT CONTRAST  TECHNIQUE: Multiplanar, multisequence MR imaging of the lumbar spine was performed. No intravenous contrast was administered.  COMPARISON:  Abdominal radiographs 01/04/2012  FINDINGS: For the  purposes of this dictation, the lowest well-formed intervertebral disc space is presumed to be L5-S1.  Images are mildly degraded by motion artifact. Vertebral alignment is normal. Vertebral body heights are preserved. Disc desiccation is present at L4-5 and L5-S1 without significant disc space height loss. There is no evidence of vertebral marrow edema. Conus medullaris is normal in signal and terminates at the superior aspect of L2. 1.7 cm T2 hyperintense focus in the left kidney likely represents a cyst.  L1-2: Minimal disc bulging and minimal facet hypertrophy without stenosis.  L2-3:  Negative.  L3-4: Minimal disc bulging and minimal facet hypertrophy without stenosis.  L4-5: Minimal disc bulging and mild facet hypertrophy without stenosis.  L5-S1: Small central disc  protrusion contributes to mild bilateral lateral recess narrowing without evidence of impingement of the S1 nerve roots or spinal stenosis. Mild circumferential disc bulging and mild-to-moderate facet hypertrophy result in mild-to-moderate bilateral neural foraminal stenosis.  IMPRESSION: Mild lumbar spondylosis, greatest at L5-S1 where there is mild lateral recess and mild-to-moderate neural foraminal stenosis bilaterally. No spinal stenosis.   Electronically Signed   By: Logan Bores   On: 07/31/2014 10:48   CT head reviewed independently showed no fractures, intracranial bleeding or midline shift. EKG: Independently reviewed, sinus rhythm with rate about 90.  Assessment/Plan Principal Problem:   Syncope Active Problems:   Essential hypertension   Carotid stenosis   Backache   Thyroid nodule-non neoplastic goiter by needle aspiration   Left leg weakness   Low back pain with radiation    Syncope Loss consciousness, denies any prodromal symptoms prior to the syncopal event. CT scan of the head showed no acute abnormalities, 12-lead EKG and first set of troponin are negative. Check urine and check  UDS. Check orthostatics, cycle  3 sets of cardiac enzymes. Place patient on telemetry, obtain carotid duplex, hydrate with IV fluids. Had recent 2-D echo in July 2015 showed normal LVEF, I will not repeat the echo.  Essential hypertension Patient  Appears to have difficult to control blood pressure, she is multiple blood pressure medications. She is on Toprol-XL 100 mg, amlodipine 10 mg, lisinopril and HCTZ. Continue Toprol-XL, hold progressive BP medicines, restart as appropriate.  Left lower extremity weakness Unclear etiology, MRI of the brain ordered already for the syncopal episode. Patient had chronic back pain with recent exacerbation, check MRI of lumbar spine.  Back pain Patient mentions left lower extremity weakness, not sure that's related to the syncope or her back pain. Obtain MRI of the back.  Thyroid nodule TSH being checked recently in its normal, repeat TSH.  Code Status: full code Family Communication: land discussed with the patient in the presence of her husband at bedside. Disposition Plan: observation, telemetry.  Time spent: 70 minutes  [facility] Pager [phone]

## 2014-08-01 NOTE — Progress Notes (Signed)
Patient had PT orders and is anticipating d/c. MD Elmahi verbal order no PT need for patient at this time

## 2014-08-01 NOTE — Evaluation (Signed)
Physical Therapy Evaluation Patient Details Name: Jodi Bowman MRN: [id number] DOB: 05-Sep-1961 Today's Date: 08/01/2014   History of Present Illness   Jodi Bowman is a 53 y.o. female past medical history of hypertension, carotid stenosis, back pain and thyroid nodule came into the hospital because of syncopal episode.   Daughter found her collapsed on the kitchen floor.  She was able to walk after the event, but has c/o L leg weakness.  Clinical Impression  Pt presents with significant L LE weakness by MMT with notable parasthesias.  This does not significantly impact her ability to be independent in a home-like environment, but is of concern enough to warrant further exploration if it does not resolve soon.  At this time pt does not need PT intervention.  Will sign off at this time.    Follow Up Recommendations No PT follow up    Equipment Recommendations  None recommended by PT    Recommendations for Other Services       Precautions / Restrictions Restrictions Weight Bearing Restrictions: No      Mobility  Bed Mobility Overal bed mobility: Independent                Transfers Overall transfer level: Independent                  Ambulation/Gait Ambulation/Gait assistance: Independent Ambulation Distance (Feet): 200 Feet Assistive device: None Gait Pattern/deviations: Step-through pattern Gait velocity: slow and deliberate   General Gait Details: shorter steps with guarded weight bearing on the L LE as if painful, but pt reports it feels funny and is sore in the ankle.  Gait smooths out as pt becomes more confident accepting weight on the Left.  Stairs            Wheelchair Mobility    Modified Rankin (Stroke Patients Only)       Balance Overall balance assessment: Needs assistance   Sitting balance-Leahy Scale: Good     Standing balance support: No upper extremity supported Standing balance-Leahy Scale: Good                   Standardized Balance Assessment Standardized Balance Assessment : Berg Balance Test Berg Balance Test Sit to Stand: Able to stand without using hands and stabilize independently Standing Unsupported: Able to stand safely 2 minutes Sitting with Back Unsupported but Feet Supported on Floor or Stool: Able to sit safely and securely 2 minutes Stand to Sit: Sits safely with minimal use of hands Transfers: Able to transfer safely, minor use of hands From Standing Position, Turn to Look Behind Over each Shoulder: Looks behind from both sides and weight shifts well         Pertinent Vitals/Pain Pain Assessment: Faces Pain Score: 4  Pain Location: general discomfort Pain Descriptors / Indicators: Dull;Sore Pain Intervention(s): Monitored during session    Home Living Family/patient expects to be discharged to:: Private residence Living Arrangements: Spouse/significant other;Children Available Help at Discharge: Family Type of Home: House Home Access: Stairs to enter Entrance Stairs-Rails: Psychiatric nurse of Steps: 4 Home Layout: One level Home Equipment: Environmental consultant - 2 wheels Additional Comments: Independent    Prior Function Level of Independence: Independent               Hand Dominance        Extremity/Trunk Assessment   Upper Extremity Assessment: Overall WFL for tasks assessed           Lower Extremity Assessment:  RLE deficits/detail;LLE deficits/detail RLE Deficits / Details: WFL, strength 5/5 LLE Deficits / Details: significant weakness, stength grossly 4-/5     Communication   Communication: No difficulties  Cognition Arousal/Alertness: Awake/alert Behavior During Therapy: WFL for tasks assessed/performed Overall Cognitive Status: Within Functional Limits for tasks assessed                      General Comments      Exercises        Assessment/Plan    PT Assessment Patent does not need any further PT services  PT  Diagnosis Other (comment) (L LE weakness and parasthesias)   PT Problem List    PT Treatment Interventions     PT Goals (Current goals can be found in the Care Plan section) Acute Rehab PT Goals PT Goal Formulation: All assessment and education complete, DC therapy    Frequency     Barriers to discharge        Co-evaluation               End of Session   Activity Tolerance: Patient tolerated treatment well Patient left: in chair;with family/visitor present Nurse Communication: Mobility status    Functional Assessment Tool Used: clinical judgement Functional Limitation: Mobility: Walking and moving around Mobility: Walking and Moving Around Current Status ([id number]): At least 1 percent but less than 20 percent impaired, limited or restricted Mobility: Walking and Moving Around Goal Status [phone]): At least 1 percent but less than 20 percent impaired, limited or restricted Mobility: Walking and Moving Around Discharge Status [phone]): At least 1 percent but less than 20 percent impaired, limited or restricted    Time: 1037-1110 PT Time Calculation (min): 33 min   Charges:   PT Evaluation $Initial PT Evaluation Tier I: 1 Procedure PT Treatments $Gait Training: 8-22 mins $Therapeutic Activity: 8-22 mins   PT G Codes:   Functional Assessment Tool Used: clinical judgement Functional Limitation: Mobility: Walking and moving around    [facility] 08/01/2014, 11:28 AM 08/01/2014  Donnella Sham, PT [phone]  (pager)

## 2014-08-01 NOTE — Discharge Summary (Signed)
Physician Discharge Summary  Jodi Bowman ZOX:[id number] DOB: 03/22/1961 DOA: 07/30/2014  PCP: Jodi Bowman  Admit date: 07/30/2014 Discharge date: 08/01/2014  Time spent: 40 minutes  Recommendations for Outpatient Follow-up:  1. Follow-up with primary care physician within one week.  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 female past medical history of hypertension, carotid stenosis, back pain and thyroid nodule came into the hospital because of syncopal episode. Patient said she collapsed in her kitchen, denies any type of prodromal symptoms, denies any chest pain, SOB, dizziness. She lost her consciousness and the next thing she knew her daughter trying to wake her up. Afterwards patient denies any slurring of speech, denies any change in her facial appearance but complains about left lower extremity weakness. Please note she was able to stand up and walk around without much effort difficulty.she complains about headache as well. In the ED CT scan of the head was done and showed no acute events, has normal blood work except slight leukocytosis of 11.7. Admitted to the hospital for further syncope workup.  Hospital Course:   Syncope Lost her consciousness, denies any prodromal symptoms prior to the syncopal event. Denies any jerky movements or confusion after the fall to suggest seizures. CT scan of the head showed no acute abnormalities, 12-lead EKG and first set of troponin are negative. Urinalysis and UDS are negative, 3 sets of cardiac enzymes are negative. Patient is on telemetry, no events, carotid duplex showed bilateral  40-59 bilateral ICA stenosis. Had recent 2-D echo in July 2015 showed normal LVEF, I will not repeat the echo. Orthostatic vitals done patient did not show orthostasis. Patient was not orthostatic or profoundly hypotensive at the time of admission, but on multiple blood pressure medications which is adjusted.  Essential hypertension Patient Appears to have difficult to control blood pressure, she is multiple blood pressure medications. She is on Toprol-XL 100 mg, amlodipine 10 mg, lisinopril and HCTZ. While she was in the hospital she was only on Toprol-XL, amlodipine, lisinopril and HCTZ were held. On discharge discharge only on Toprol-XL and 10 mg of lisinopril. Blood pressure well controlled.  Left lower extremity weakness Unclear etiology, MRI of the brain ordered already for the syncopal episode. Patient had chronic back pain with recent exacerbation, MRI of the lumbar spine without significant abnormalities. PT to evaluate.  Back pain Patient mentions left lower extremity weakness, not sure that's related to the syncope or her back pain. MRI of lumbar spine, no significant abnormalities. Back pain is much better.  Thyroid nodule Thyroid nodule with recent FNAC showed follicular lesion of undetermined significance, TSH iswithin normal limits, 0.635.  OSA On CPAP at home continue CPAP, instructed to check her blood pressure multiple times per day. If she continues to have morning hypertension, CPAP should be checked. It's been about 18 months since last check.  Procedures:  None  Consultations:  None  Discharge Exam: Filed Vitals:   08/01/14 0633  BP: 116/68  Pulse: 78  Temp: 98.4 F (36.9 C)  Resp: 16   General: Alert and awake, oriented x3, not in any acute distress. HEENT: anicteric sclera, pupils reactive  to light and accommodation, EOMI CVS: S1-S2 clear, no murmur rubs or gallops Chest: clear to auscultation bilaterally, no wheezing, rales or rhonchi Abdomen:  soft nontender, nondistended, normal bowel sounds, no organomegaly Extremities: no cyanosis, clubbing or edema noted bilaterally Neuro: Cranial nerves II-XII intact, no focal neurological deficits  Discharge Instructions You were cared for by a hospitalist during your hospital stay. If you have any questions about your discharge medications or the care you received while you were in the hospital after you are discharged, you can call the unit and asked to speak with the hospitalist on call if the hospitalist that took care of you is not available. Once you are discharged, your primary care physician will handle any further medical issues. Please note that NO REFILLS for any discharge medications will be authorized once you are discharged, as it is imperative that you return to your primary care physician (or establish a relationship with a primary care physician if you do not have one) for your aftercare needs so that they can reassess your need for medications and monitor your lab values.  Discharge Instructions    Diet - low sodium heart healthy    Complete by:  As directed      Increase activity slowly    Complete by:  As directed           Current Discharge Medication List    START taking these medications   Details  lisinopril (PRINIVIL) 10 MG tablet Take 1 tablet (10 mg total) by mouth daily. Qty: 30 tablet, Refills: 0      CONTINUE these medications which have NOT CHANGED   Details  aspirin EC 81 MG EC tablet Take 1 tablet (81 mg total) by mouth daily. Qty: 30 tablet, Refills: 3    cholecalciferol (VITAMIN D) 1000 UNITS tablet Take 1,000 Units by mouth daily.    DULoxetine (CYMBALTA) 60 MG capsule Take 60 mg by mouth 2 (two) times daily.     fluticasone (FLONASE) 50 MCG/ACT nasal spray Place 2 sprays into both nostrils daily as needed for allergies.     loratadine (CLARITIN) 10 MG tablet Take 10 mg by mouth every morning.     metoprolol succinate (TOPROL-XL) 100 MG 24 hr  tablet Take 1 tablet (100 mg total) by mouth daily. Take with or immediately following a meal. Qty: 30 tablet, Refills: 3    nitroGLYCERIN (NITROSTAT) 0.4 MG SL tablet Place 1 tablet (0.4 mg total) under the tongue every 5 (five) minutes x 3 doses as needed for chest pain. Qty: 25 tablet, Refills: 12    Oxycodone HCl 10 MG TABS Take 10 mg by mouth 2 (two) times daily as needed (pain).    pantoprazole (PROTONIX) 40 MG tablet Take 40 mg by mouth at bedtime.    pravastatin (PRAVACHOL) 40 MG tablet Take 40 mg by mouth at bedtime.     pregabalin (LYRICA) 25 MG capsule Take 25 mg by mouth 3 (three) times daily.    tiZANidine (ZANAFLEX) 4 MG tablet Take 4 mg by mouth every 6 (six) hours as needed for muscle spasms.    topiramate (TOPAMAX) 25 MG tablet Take 75 mg by mouth 2 (two) times daily.       STOP taking these medications     amLODipine (NORVASC) 10 MG tablet      lisinopril-hydrochlorothiazide (PRINZIDE,ZESTORETIC) 20-25 MG per tablet        Allergies  Allergen Reactions  . Shrimp [Shellfish Allergy] Anaphylaxis  . Naproxen Other (  See Comments)    Makes stomach cramp and burning badly.      The results of significant diagnostics from this hospitalization (including imaging, microbiology, ancillary and laboratory) are listed below for reference.    Significant Diagnostic Studies: Ct Head Wo Contrast  07/30/2014   CLINICAL DATA:  Syncopal episode with loss of consciousness. History of prior CVA with left-sided weakness.  EXAM: CT HEAD WITHOUT CONTRAST  TECHNIQUE: Contiguous axial images were obtained from the base of the skull through the vertex without intravenous contrast.  COMPARISON:  10/12/2013  FINDINGS: The brain demonstrates no evidence of hemorrhage, infarction, edema, mass effect, extra-axial fluid collection, hydrocephalus or mass lesion. The skull is unremarkable.  IMPRESSION: Normal head CT.   Electronically Signed   By: Aletta Edouard M.D.   On: 07/30/2014 16:00    Mr Brain Wo Contrast  07/31/2014   CLINICAL DATA:  Syncope and left leg weakness.  EXAM: MRI HEAD WITHOUT CONTRAST  TECHNIQUE: Multiplanar, multiecho pulse sequences of the brain and surrounding structures were obtained without intravenous contrast.  COMPARISON:  Head CT 07/30/2014 and MRI 10/12/2013  FINDINGS: The cerebellar tonsils are again noted to extend at or minimally below the foramen magnum, unchanged and within normal limits. There is no evidence of acute infarct, intracranial hemorrhage, mass, midline shift, or extra-axial fluid collection. Ventricles and sulci are within normal limits for age. Scattered, small foci of T2 hyperintensity in the cerebral white matter and pons do not appear significantly changed from the prior MRI and are nonspecific but compatible with mild chronic small vessel ischemic disease.  Prior bilateral ocular lens extractions are noted. Paranasal sinuses and mastoid air cells are clear. Major intracranial vascular flow voids are preserved.  IMPRESSION: 1. No acute intracranial abnormality or mass. 2. Unchanged, mild chronic small vessel ischemic disease.   Electronically Signed   By: Logan Bores   On: 07/31/2014 10:37   Mr Lumbar Spine Wo Contrast  07/31/2014   CLINICAL DATA:  Syncopal episode with fall, collapsing at home. Low back pain and left lower extremity weakness.  EXAM: MRI LUMBAR SPINE WITHOUT CONTRAST  TECHNIQUE: Multiplanar, multisequence MR imaging of the lumbar spine was performed. No intravenous contrast was administered.  COMPARISON:  Abdominal radiographs 01/04/2012  FINDINGS: For the purposes of this dictation, the lowest well-formed intervertebral disc space is presumed to be L5-S1.  Images are mildly degraded by motion artifact. Vertebral alignment is normal. Vertebral body heights are preserved. Disc desiccation is present at L4-5 and L5-S1 without significant disc space height loss. There is no evidence of vertebral marrow edema. Conus medullaris is  normal in signal and terminates at the superior aspect of L2. 1.7 cm T2 hyperintense focus in the left kidney likely represents a cyst.  L1-2: Minimal disc bulging and minimal facet hypertrophy without stenosis.  L2-3:  Negative.  L3-4: Minimal disc bulging and minimal facet hypertrophy without stenosis.  L4-5: Minimal disc bulging and mild facet hypertrophy without stenosis.  L5-S1: Small central disc protrusion contributes to mild bilateral lateral recess narrowing without evidence of impingement of the S1 nerve roots or spinal stenosis. Mild circumferential disc bulging and mild-to-moderate facet hypertrophy result in mild-to-moderate bilateral neural foraminal stenosis.  IMPRESSION: Mild lumbar spondylosis, greatest at L5-S1 where there is mild lateral recess and mild-to-moderate neural foraminal stenosis bilaterally. No spinal stenosis.   Electronically Signed   By: Logan Bores   On: 07/31/2014 10:48    Microbiology: No results found for this or any previous visit (from  the past 240 hour(s)).   Labs: Basic Metabolic Panel:  Recent Labs Lab 07/30/14 1351 07/31/14 0643  NA 142 139  K 4.3 3.9  CL 104 103  CO2 27 24  GLUCOSE 94 115*  BUN 19 15  CREATININE 1.02 0.97  CALCIUM 9.8 8.9   Liver Function Tests:  Recent Labs Lab 07/30/14 1351  AST 20  ALT 21  ALKPHOS 87  BILITOT <0.2*  PROT 7.3  ALBUMIN 3.5   No results for input(s): LIPASE, AMYLASE in the last 168 hours. No results for input(s): AMMONIA in the last 168 hours. CBC:  Recent Labs Lab 07/30/14 1351 07/31/14 0643  WBC 11.7* 11.0*  NEUTROABS 6.1  --   HGB 13.6 12.5  HCT 40.2 37.6  MCV 85.5 85.3  PLT 299 267   Cardiac Enzymes:  Recent Labs Lab 07/30/14 1920 07/31/14 0134 07/31/14 0643  TROPONINI <0.30 <0.30 <0.30   BNP: BNP (last 3 results) No results for input(s): PROBNP in the last 8760 hours. CBG:  Recent Labs Lab 07/30/14 1400  GLUCAP 134*       Signed:  Tabathia Knoche A  [facility] 08/01/2014, 10:55 AM

## 2014-08-01 NOTE — Care Management Note (Addendum)
    Page 1 of 1   08/01/2014     12:06:07 PM CARE MANAGEMENT NOTE 08/01/2014  Patient:  Jodi Bowman, Jodi Bowman   Account Number:  [id number]  Date Initiated:  08/01/2014  Documentation initiated by:  Tomi Bamberger  Subjective/Objective Assessment:   dx syncope  admit as observation- lives with family.     Action/Plan:   Anticipated DC Date:  08/01/2014   Anticipated DC Plan:  [place]  CM consult      Choice offered to / List presented to:             Status of service:  Completed, signed off Medicare Important Message given?  NO (If response is "NO", the following Medicare IM given date fields will be blank) Date Medicare IM given:   Medicare IM given by:   Date Additional Medicare IM given:   Additional Medicare IM given by:    Discharge Disposition:  HOME/SELF CARE  Per UR Regulation:  Reviewed for med. necessity/level of care/duration of stay  If discussed at [place] of Stay Meetings, dates discussed:    Comments:  08/11/14 [place], BSN [phone] patient lives with family, pt for dc today.

## 2014-08-01 NOTE — Progress Notes (Signed)
NURSING PROGRESS NOTE  Jodi Bowman [id number] Discharge Data: 08/01/2014 11:15 AM Attending Provider: Verlee Monte, MD WUJ:[id number], PA-C     Lebron Quam to be D/C'd Home per MD order.  Discussed with the patient the After Visit Summary and all questions fully answered. All IV's discontinued with no bleeding noted. All belongings returned to patient for patient to take home.   Last Vital Signs:  Blood pressure 116/68, pulse 78, temperature 98.4 F (36.9 C), temperature source Oral, resp. rate 16, height 4\' 11"  (1.499 m), weight 123.832 kg (273 lb), SpO2 99 %.  Discharge Medication List   Medication List    STOP taking these medications        amLODipine 10 MG tablet  Commonly known as:  NORVASC     lisinopril-hydrochlorothiazide 20-25 MG per tablet  Commonly known as:  PRINZIDE,ZESTORETIC      TAKE these medications        aspirin 81 MG EC tablet  Take 1 tablet (81 mg total) by mouth daily.     cholecalciferol 1000 UNITS tablet  Commonly known as:  VITAMIN D  Take 1,000 Units by mouth daily.     DULoxetine 60 MG capsule  Commonly known as:  CYMBALTA  Take 60 mg by mouth 2 (two) times daily.     fluticasone 50 MCG/ACT nasal spray  Commonly known as:  FLONASE  Place 2 sprays into both nostrils daily as needed for allergies.     lisinopril 10 MG tablet  Commonly known as:  PRINIVIL  Take 1 tablet (10 mg total) by mouth daily.     loratadine 10 MG tablet  Commonly known as:  CLARITIN  Take 10 mg by mouth every morning.     metoprolol succinate 100 MG 24 hr tablet  Commonly known as:  TOPROL-XL  Take 1 tablet (100 mg total) by mouth daily. Take with or immediately following a meal.     nitroGLYCERIN 0.4 MG SL tablet  Commonly known as:  NITROSTAT  Place 1 tablet (0.4 mg total) under the tongue every 5 (five) minutes x 3 doses as needed for chest pain.     Oxycodone HCl 10 MG Tabs  Take 10 mg by mouth 2 (two) times daily as needed (pain).     pantoprazole 40 MG tablet  Commonly known as:  PROTONIX  Take 40 mg by mouth at bedtime.     pravastatin 40 MG tablet  Commonly known as:  PRAVACHOL  Take 40 mg by mouth at bedtime.     pregabalin 25 MG capsule  Commonly known as:  LYRICA  Take 25 mg by mouth 3 (three) times daily.     tiZANidine 4 MG tablet  Commonly known as:  ZANAFLEX  Take 4 mg by mouth every 6 (six) hours as needed for muscle spasms.     topiramate 25 MG tablet  Commonly known as:  TOPAMAX  Take 75 mg by mouth 2 (two) times daily.         Wallie Renshaw, RN

## 2014-09-06 ENCOUNTER — Encounter (HOSPITAL_COMMUNITY): Payer: Self-pay | Admitting: Emergency Medicine

## 2014-09-06 ENCOUNTER — Emergency Department (HOSPITAL_COMMUNITY)
Admission: EM | Admit: 2014-09-06 | Discharge: 2014-09-06 | Disposition: A | Discharge status: Home / Self Care | Payer: Medicaid Other | Attending: Emergency Medicine | Admitting: Emergency Medicine

## 2014-09-06 DIAGNOSIS — Z79899 Other long term (current) drug therapy: Secondary | ICD-10-CM | POA: Insufficient documentation

## 2014-09-06 DIAGNOSIS — E669 Obesity, unspecified: Secondary | ICD-10-CM | POA: Diagnosis not present

## 2014-09-06 DIAGNOSIS — M545 Low back pain, unspecified: Secondary | ICD-10-CM

## 2014-09-06 DIAGNOSIS — Z8673 Personal history of transient ischemic attack (TIA), and cerebral infarction without residual deficits: Secondary | ICD-10-CM | POA: Insufficient documentation

## 2014-09-06 DIAGNOSIS — G43909 Migraine, unspecified, not intractable, without status migrainosus: Secondary | ICD-10-CM | POA: Diagnosis not present

## 2014-09-06 DIAGNOSIS — Z8719 Personal history of other diseases of the digestive system: Secondary | ICD-10-CM | POA: Insufficient documentation

## 2014-09-06 DIAGNOSIS — G8929 Other chronic pain: Secondary | ICD-10-CM | POA: Diagnosis not present

## 2014-09-06 DIAGNOSIS — E780 Pure hypercholesterolemia: Secondary | ICD-10-CM | POA: Diagnosis not present

## 2014-09-06 DIAGNOSIS — M1990 Unspecified osteoarthritis, unspecified site: Secondary | ICD-10-CM | POA: Diagnosis not present

## 2014-09-06 DIAGNOSIS — M797 Fibromyalgia: Secondary | ICD-10-CM | POA: Diagnosis not present

## 2014-09-06 DIAGNOSIS — Z7982 Long term (current) use of aspirin: Secondary | ICD-10-CM | POA: Diagnosis not present

## 2014-09-06 DIAGNOSIS — I10 Essential (primary) hypertension: Secondary | ICD-10-CM | POA: Diagnosis not present

## 2014-09-06 MED ORDER — HYDROMORPHONE HCL 2 MG/ML IJ SOLN
2.0000 mg | Freq: Once | INTRAMUSCULAR | Status: AC
  Administered 2014-09-06: 2 mg via INTRAMUSCULAR
  Filled 2014-09-06: qty 1

## 2014-09-06 NOTE — ED Provider Notes (Signed)
CSN: [id number]     Arrival date & time 09/06/14  2229 History   First MD Initiated Contact with Patient 09/06/14 2312     Chief Complaint  Patient presents with  . Back Pain  . Fibromyalgia   Patient is a 53 y.o. female presenting with back pain. The history is provided by the patient. No language interpreter was used.  Back Pain Associated symptoms: no abdominal pain, no dysuria, no fever, no numbness and no weakness    This chart was scribed for non-physician practitioner Charlann Lange, PA-C,  working with Charlesetta Shanks, MD, by Thea Alken, ED Scribe. This patient was seen in room WTR5/WTR5 and the patient's care was started at 11:15 PM.  Jodi Bowman is a 53 y.o. female who presents to the Emergency Department complaining of back pain and hip that began 4 days ago. Pt states this pain is due to fibromyalgia. She reports she is unable to get an appointment with neurology until next week. Pt has been taking Cymbalta, Lyrica and oxycodone without relief. Pt denies fever. Pt denies bladder and bowel incontinence.    Past Medical History  Diagnosis Date  . Hypertension   . Hypercholesteremia   . Lower back pain   . Nontoxic uninodular goiter     sees dr vollmer at [facility]  . Calculus of gallbladder without mention of cholecystitis or obstruction   . Arthritis   . Stroke 2000  . Migraine   . Sleep apnea     STOPBANG=5  . Fibromyalgia   . Obesity    Past Surgical History  Procedure Laterality Date  . Knee arthroscopy  one 1995 and 1 in 1997    both knees done  . Abdominal hysterectomy    . Cesarean section  yrs ago    done x 2  . Surgery for endometriosis  yrs ago  . Thryoid biopsy  December 01, 2011     [facility]  . Cholecystectomy  01/05/2012    Procedure: LAPAROSCOPIC CHOLECYSTECTOMY WITH INTRAOPERATIVE CHOLANGIOGRAM;  Surgeon: Pedro Earls, MD;  Location: [facility];  Service: General;  Laterality: N/A;  . Colonoscopy  10/08/2012    Procedure: COLONOSCOPY;  Surgeon:  Beryle Beams, MD;  Location: [facility];  Service: Endoscopy;  Laterality: N/A;   Family History  Problem Relation Age of Onset  . Cancer Brother     colon and lung  . Cancer Maternal Grandmother     colon   History  Substance Use Topics  . Smoking status: Never Smoker   . Smokeless tobacco: Never Used  . Alcohol Use: No   OB History    No data available     Review of Systems  Constitutional: Negative for fever and chills.  Cardiovascular: Negative for leg swelling.  Gastrointestinal: Negative for abdominal pain.  Genitourinary: Negative for dysuria, enuresis and difficulty urinating.  Musculoskeletal: Positive for myalgias and back pain.  Skin: Negative for color change.  Neurological: Negative for weakness and numbness.   Allergies  Shrimp and Naproxen  Home Medications   Prior to Admission medications   Medication Sig Start Date End Date Taking? Authorizing Provider  aspirin EC 81 MG EC tablet Take 1 tablet (81 mg total) by mouth daily. 03/13/14   Clent Demark, MD  cholecalciferol (VITAMIN D) 1000 UNITS tablet Take 1,000 Units by mouth daily.    Historical Provider, MD  DULoxetine (CYMBALTA) 60 MG capsule Take 60 mg by mouth 2 (two) times daily.  Historical Provider, MD  fluticasone (FLONASE) 50 MCG/ACT nasal spray Place 2 sprays into both nostrils daily as needed for allergies.     Historical Provider, MD  lisinopril (PRINIVIL) 10 MG tablet Take 1 tablet (10 mg total) by mouth daily. 08/01/14   Verlee Monte, MD  loratadine (CLARITIN) 10 MG tablet Take 10 mg by mouth every morning.     Historical Provider, MD  metoprolol succinate (TOPROL-XL) 100 MG 24 hr tablet Take 1 tablet (100 mg total) by mouth daily. Take with or immediately following a meal. Patient taking differently: Take 100 mg by mouth at bedtime. Take with or immediately following a meal. 03/13/14   Clent Demark, MD  nitroGLYCERIN (NITROSTAT) 0.4 MG SL tablet Place 1 tablet (0.4 mg total) under the  tongue every 5 (five) minutes x 3 doses as needed for chest pain. 03/13/14   Clent Demark, MD  Oxycodone HCl 10 MG TABS Take 10 mg by mouth 2 (two) times daily as needed (pain).    Historical Provider, MD  pantoprazole (PROTONIX) 40 MG tablet Take 40 mg by mouth at bedtime.    Historical Provider, MD  pravastatin (PRAVACHOL) 40 MG tablet Take 40 mg by mouth at bedtime.     Historical Provider, MD  pregabalin (LYRICA) 25 MG capsule Take 25 mg by mouth 3 (three) times daily.    Historical Provider, MD  tiZANidine (ZANAFLEX) 4 MG tablet Take 4 mg by mouth every 6 (six) hours as needed for muscle spasms.    Historical Provider, MD  topiramate (TOPAMAX) 25 MG tablet Take 75 mg by mouth 2 (two) times daily.     Historical Provider, MD   BP 193/99 mmHg  Pulse 93  Temp(Src) 97.8 F (36.6 C) (Oral)  Resp 21  Ht 4\' 11"  (1.499 m)  Wt 270 lb (122.471 kg)  BMI 54.50 kg/m2  SpO2 99% Physical Exam  Constitutional: She is oriented to person, place, and time. She appears well-developed and well-nourished. No distress.  HENT:  Head: Normocephalic and atraumatic.  Eyes: Conjunctivae and EOM are normal.  Neck: Neck supple.  Cardiovascular: Normal rate.   Pulmonary/Chest: Effort normal.  Musculoskeletal: Normal range of motion.  Tender across lower back. Indeterminate.  Swelling due to body habitus reflexes equal in LE. DP intact ambulatory.   Neurological: She is alert and oriented to person, place, and time.  Skin: Skin is warm and dry.  Psychiatric: She has a normal mood and affect. Her behavior is normal.  Nursing note and vitals reviewed.  ED Course  Procedures (including critical care time) DIAGNOSTIC STUDIES: Oxygen Saturation is 99% on RA, normal by my interpretation.    COORDINATION OF CARE: 11:29 PM- Pt advised of plan for treatment including and pt agrees.  Labs Review Labs Reviewed - No data to display  Imaging Review No results found.   EKG Interpretation None      MDM    Final diagnoses:  None    1. Chronic low back pain 2. Fibromyalgia  She returns to the emergency department with uncontrolled pain secondary to her fibromyalgia. No new symptoms. Neurologic follow up in place.   I personally performed the services described in this documentation, which was scribed in my presence. The recorded information has been reviewed and is accurate.    Dewaine Oats, PA-C 09/06/14 [place], MD 09/07/14 [phone]

## 2014-09-06 NOTE — Discharge Instructions (Signed)

## 2014-09-06 NOTE — ED Notes (Signed)
Pt states that she has a hx of fibromyalgia and is on the fourth day of a flare-up. Pt states that she is having pain in her back and hips. Pt states her neurologist was unable to see her until 10/03/14.

## 2014-09-13 IMAGING — CT CT HEAD W/O CM
2 series · 16 of 30 positions shown, 20 images · non-contrast
Comparison: None.

CLINICAL DATA: Left-sided facial numbness

EXAM:
CT HEAD WITHOUT CONTRAST
TECHNIQUE: Contiguous axial images were obtained from the base of the skull
through the vertex without intravenous contrast.

[Series 2: head w/o · axial · non-contrast · 0.48mm/px · z∈[+1407,+1527]mm · 13 of 28 slices shown, 17 images]
[im 2/28  brain]
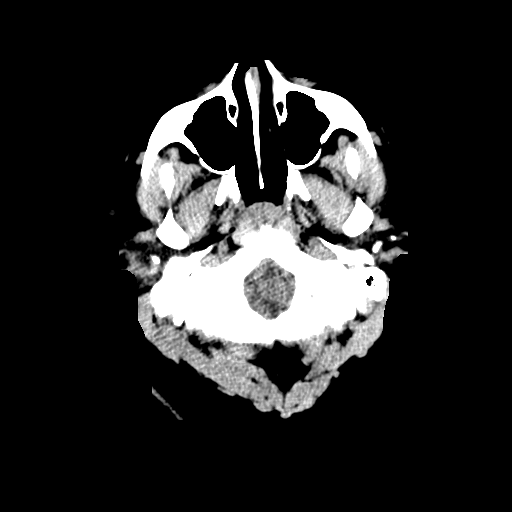
[im 2/28  bone]
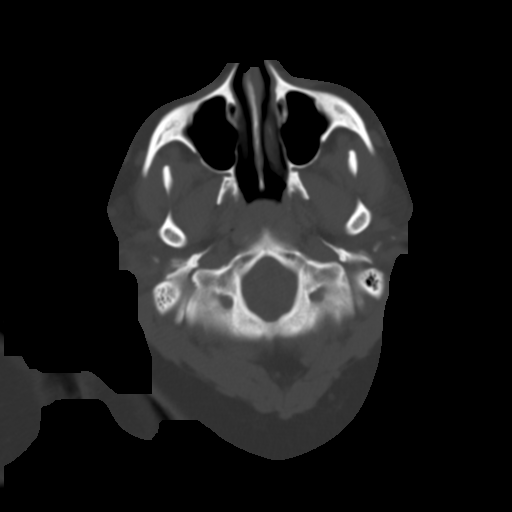
[im 4/28  brain]
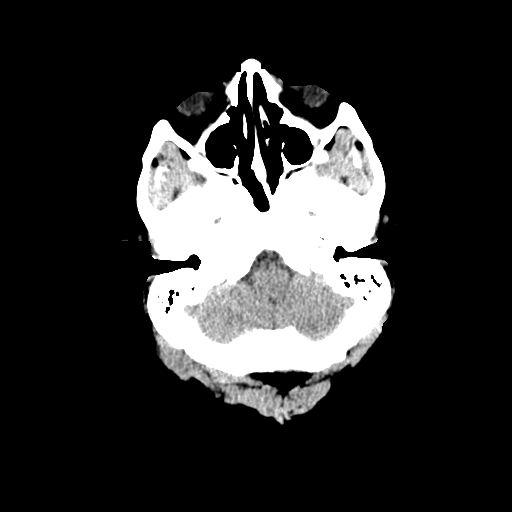
[im 6/28  brain]
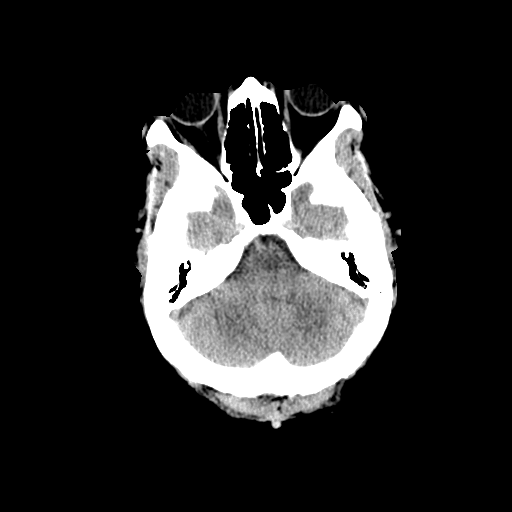
[im 8/28  brain]
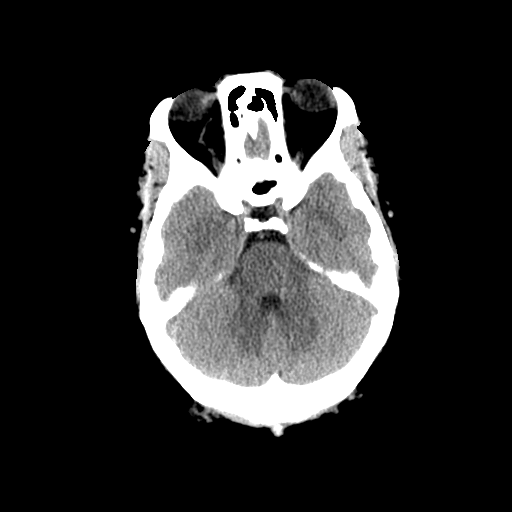
[im 10/28  brain]
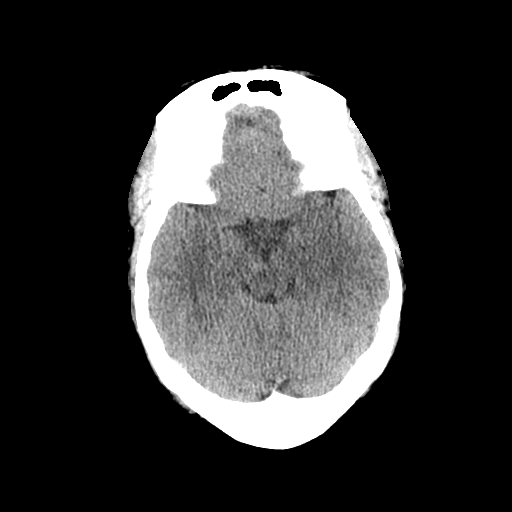
[im 10/28  bone]
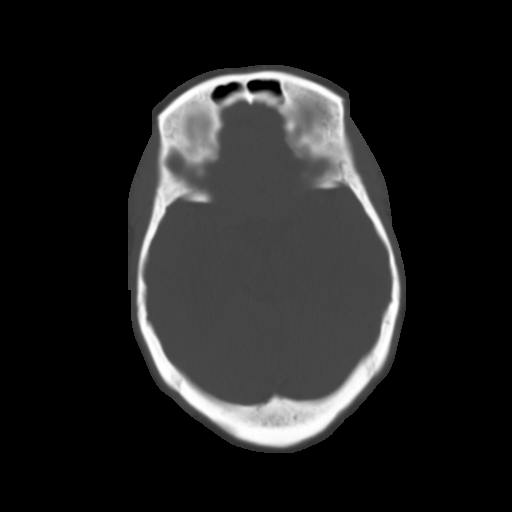
[im 12/28  brain]
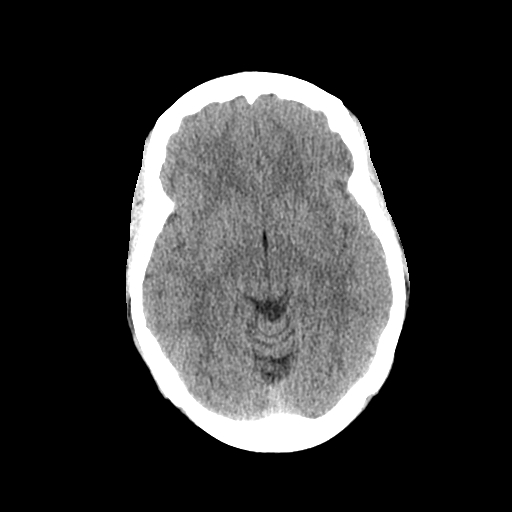
[im 14/28  brain]
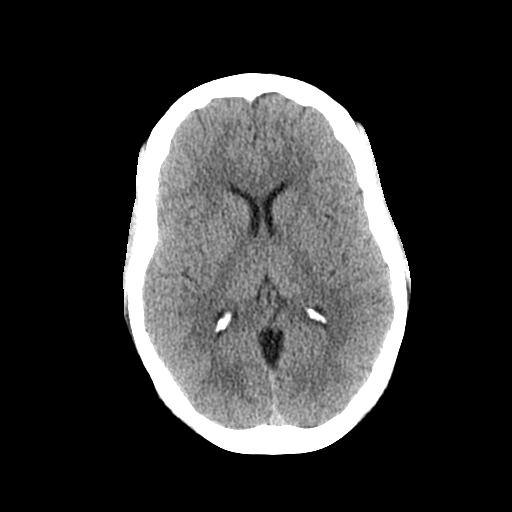
[im 16/28  brain]
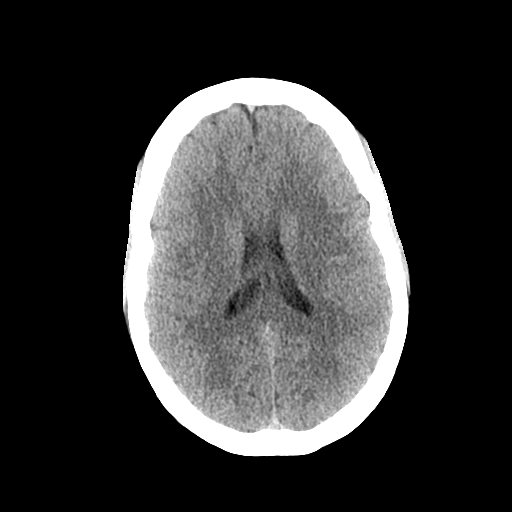
[im 18/28  brain]
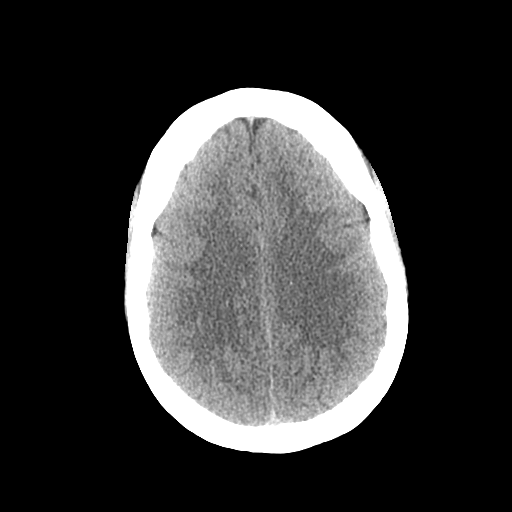
[im 18/28  bone]
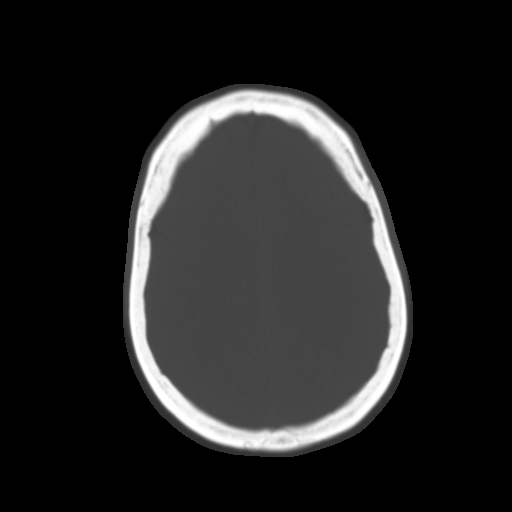
[im 20/28  brain]
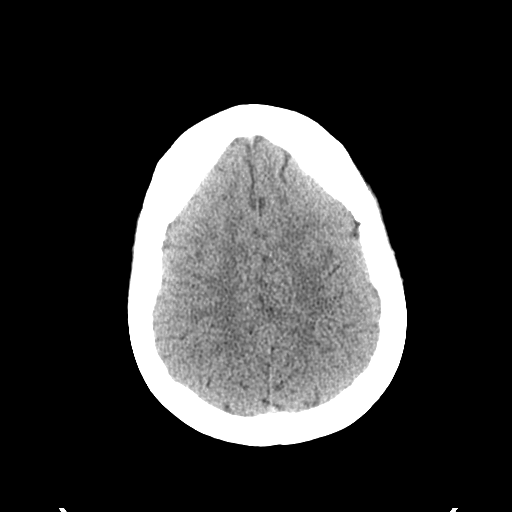
[im 22/28  brain]
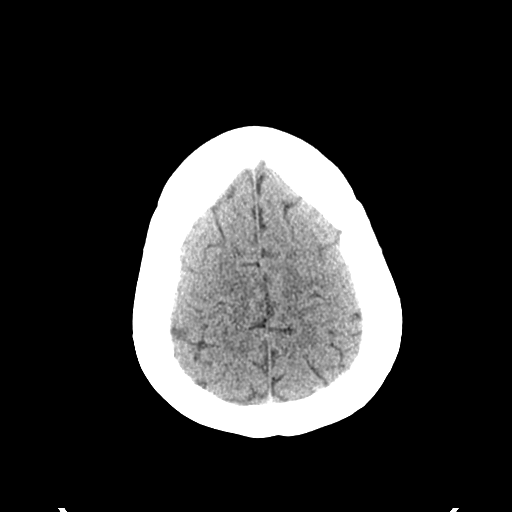
[im 24/28  brain]
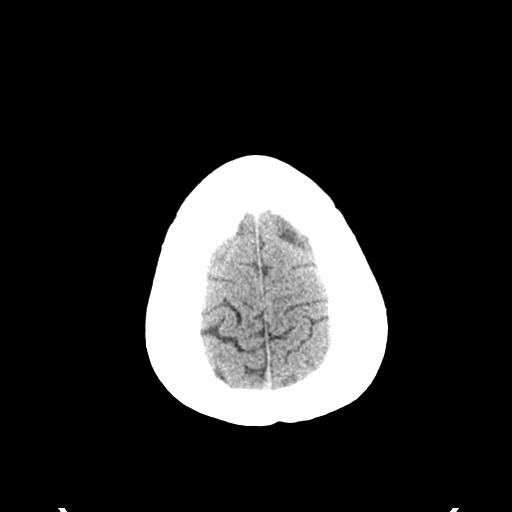
[im 26/28  brain]
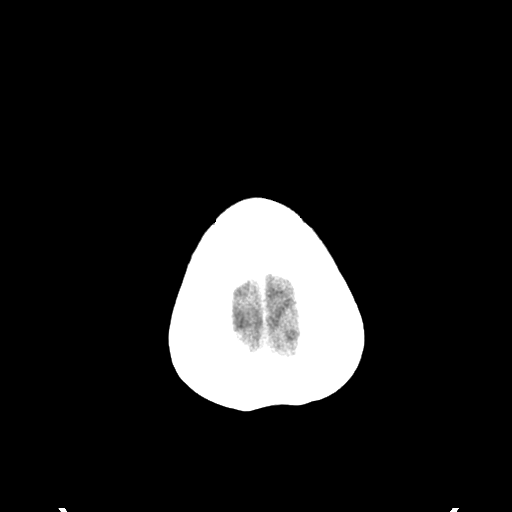
[im 26/28  bone]
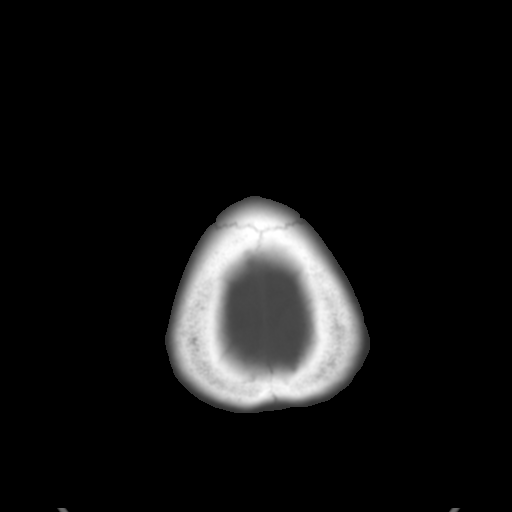

[Series 3: bone windows · axial · 0.48mm/px · z∈[+1407,+1447]mm · 3 of 28 slices shown]
[im 2/28  bone]
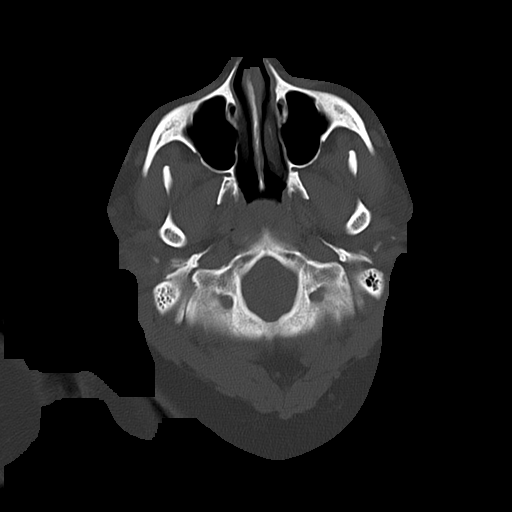
[im 6/28  bone]
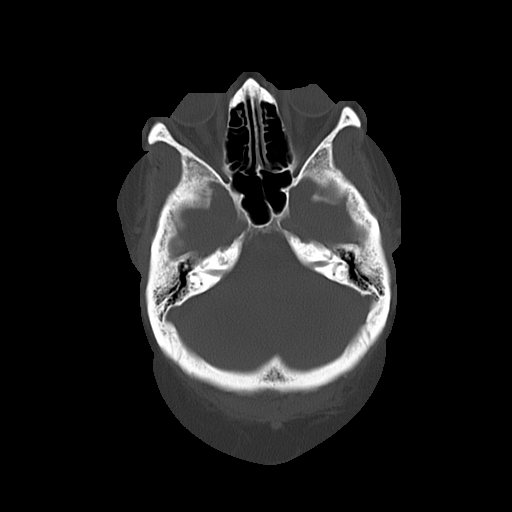
[im 10/28  bone]
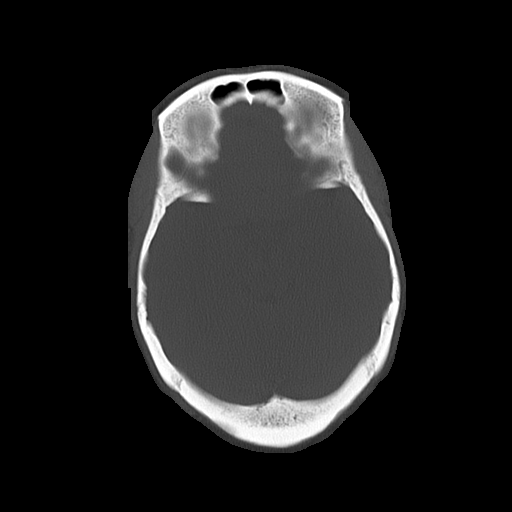

[16 of 30 positions shown; findings below may reference images not displayed]

FINDINGS: Normal mediastinum and cardiac silhouette. Normal pulmonary
vasculature. No evidence of effusion, infiltrate, or pneumothorax.
No acute bony abnormality. Paranasal sinuses and mastoid air cells
are clear.
IMPRESSION: Normal head CT

## 2014-09-13 IMAGING — MR MR HEAD W/O CM
8 of 10 series · 37 of 48 positions shown · non-contrast
Comparison: CT head without contrast from the same day. MRI brain
without contrast 02/12/2010

CLINICAL DATA: Left-sided facial numbness.  Stroke like symptoms.

EXAM:
MRI HEAD WITHOUT CONTRAST
TECHNIQUE: Multiplanar, multiecho pulse sequences of the brain and surrounding
structures were obtained without intravenous contrast.

[Series 3: T1 · sagittal · 5.0mm · 0.47mm/px · 1 of 24 slices shown]
[im 1/24]
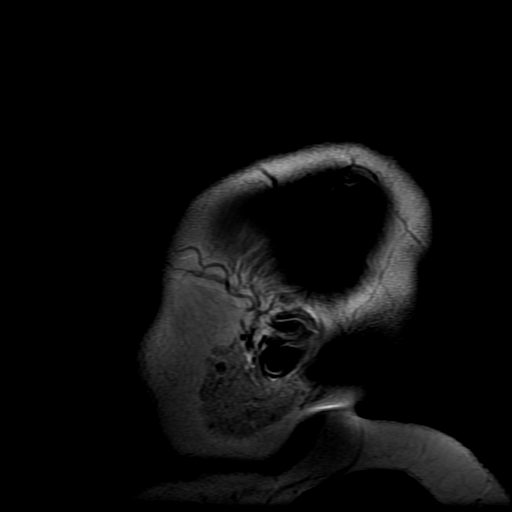

[Series 4: DWI · axial · 5.0mm · 1.09mm/px · z∈[-86,+59]mm · 8 of 60 slices shown (1 of 4)]
[im 1/60]
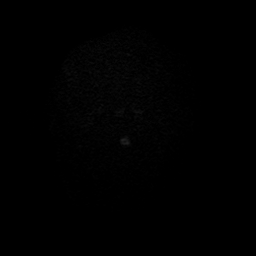
[im 9/60]
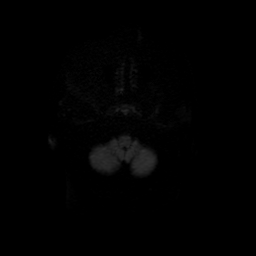
[im 17/60]
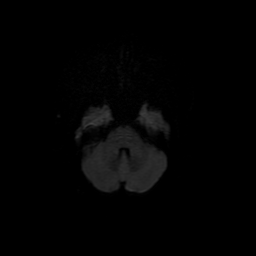
[im 26/60]
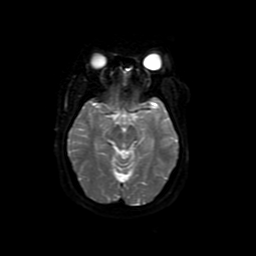
[im 34/60]
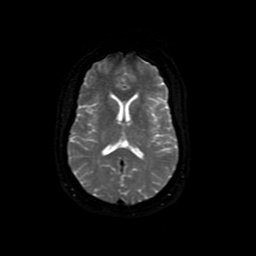
[im 43/60]
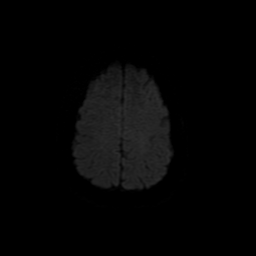
[im 51/60]
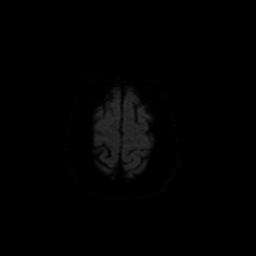
[im 60/60]
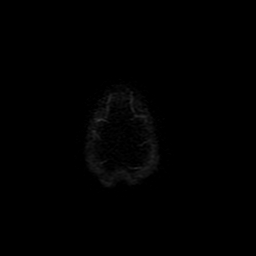

[Series 5: DWI · coronal · 5.0mm · 1.09mm/px · 9 of 70 slices shown (2 of 4)]
[im 1/70]
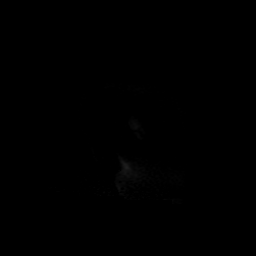
[im 9/70]
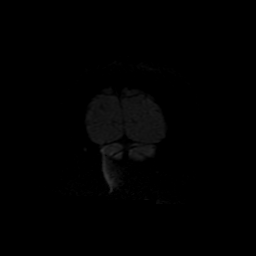
[im 18/70]
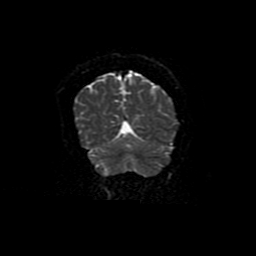
[im 26/70]
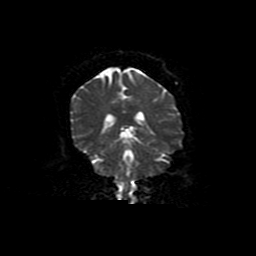
[im 35/70]
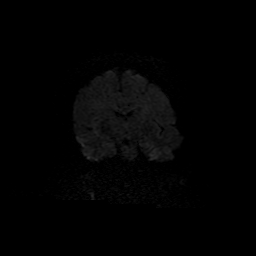
[im 44/70]
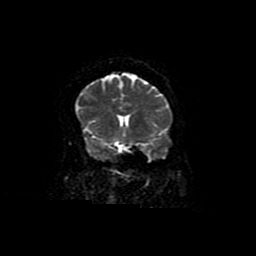
[im 52/70]
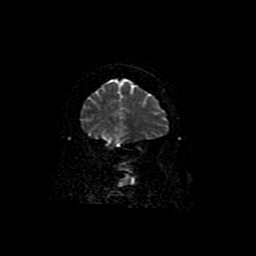
[im 61/70]
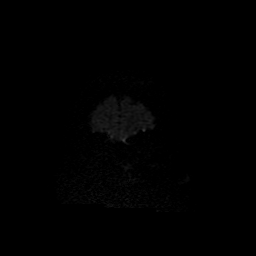
[im 70/70]
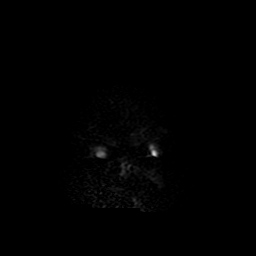

[Series 6: T2 · axial · 5.0mm · 0.43mm/px · z∈[-69,+68]mm · 3 of 22 slices shown (1 of 2)]
[im 1/22]
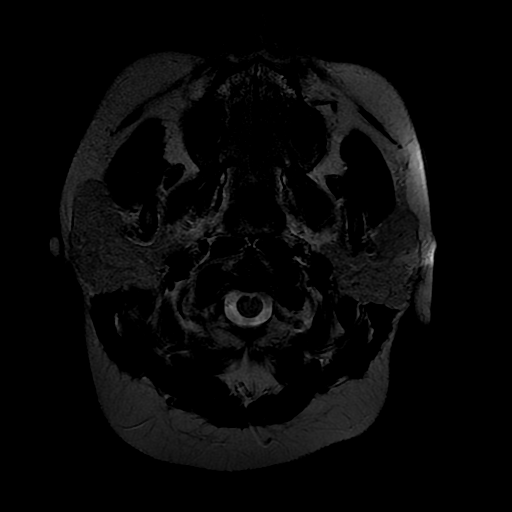
[im 11/22]
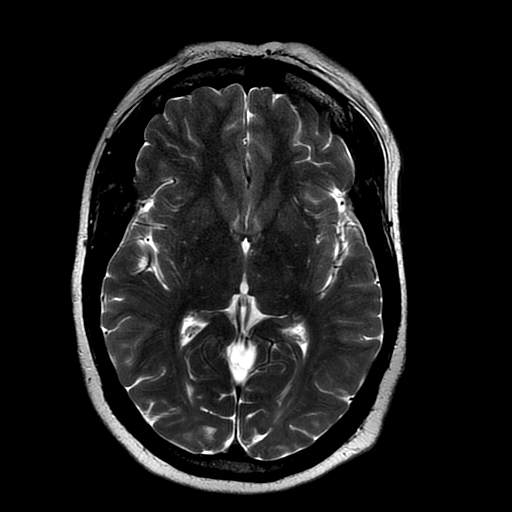
[im 22/22]
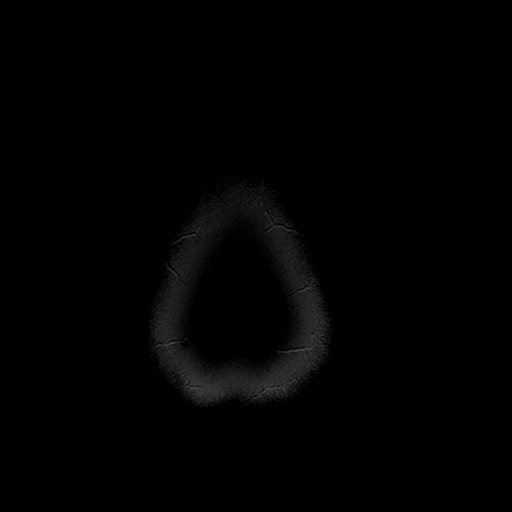

[Series 7: FLAIR · axial · 5.0mm · 0.43mm/px · z∈[-74,+73]mm · 3 of 22 slices shown]
[im 1/22]
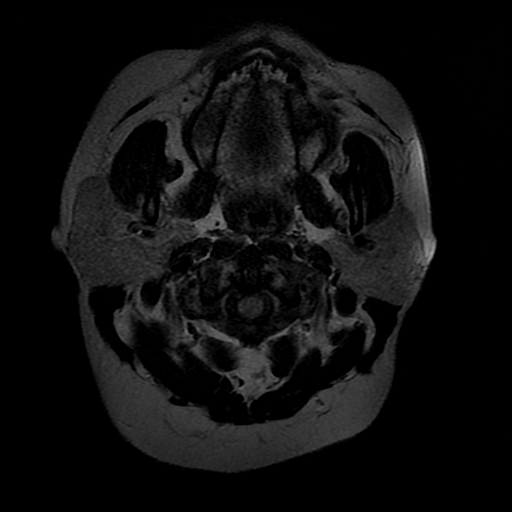
[im 11/22]
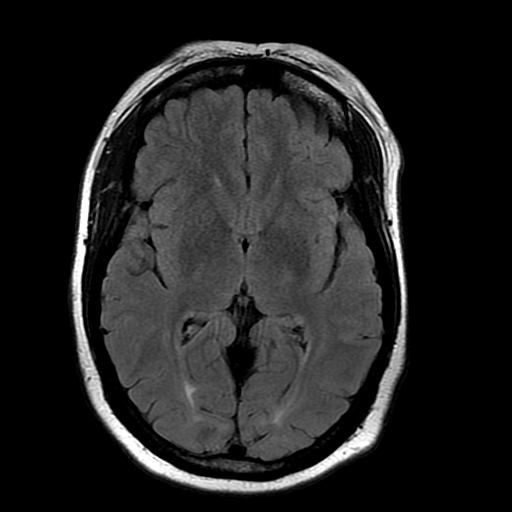
[im 22/22]
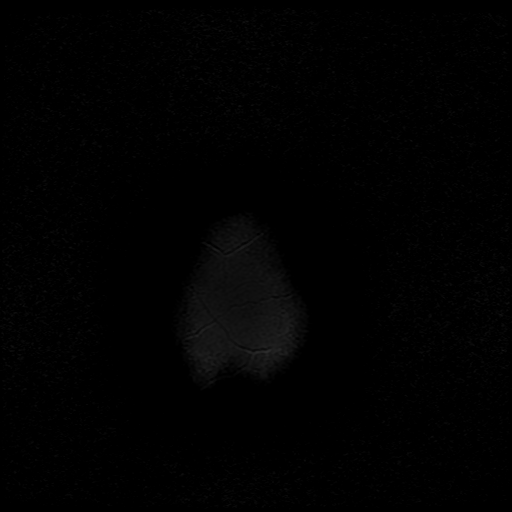

[Series 10: T2 · coronal · 5.0mm · 0.45mm/px · 4 of 27 slices shown (2 of 2)]
[im 1/27]
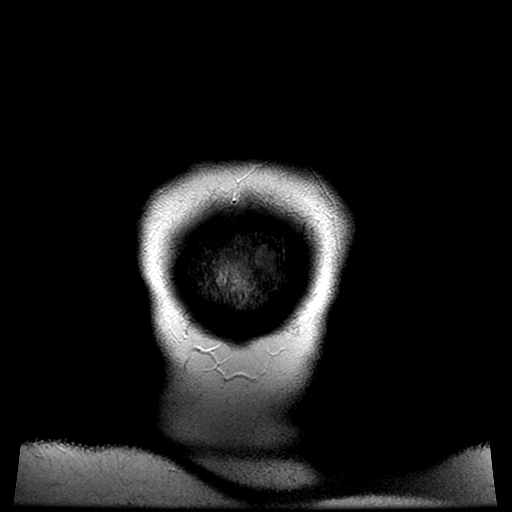
[im 9/27]
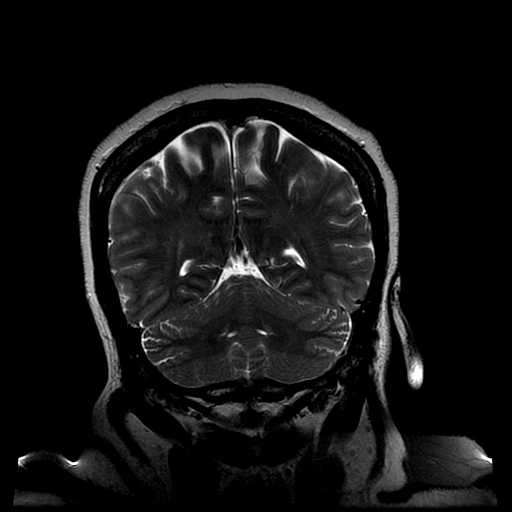
[im 18/27]
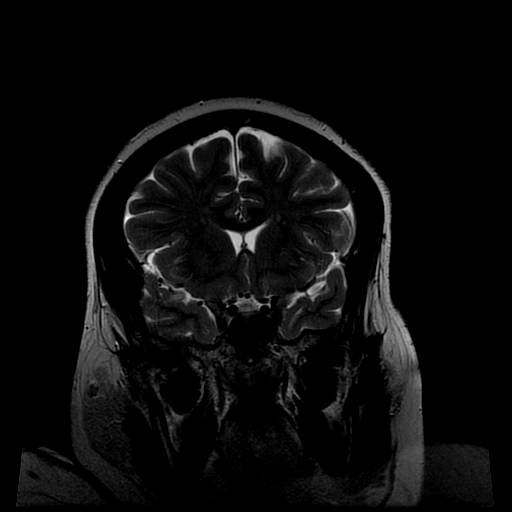
[im 27/27]
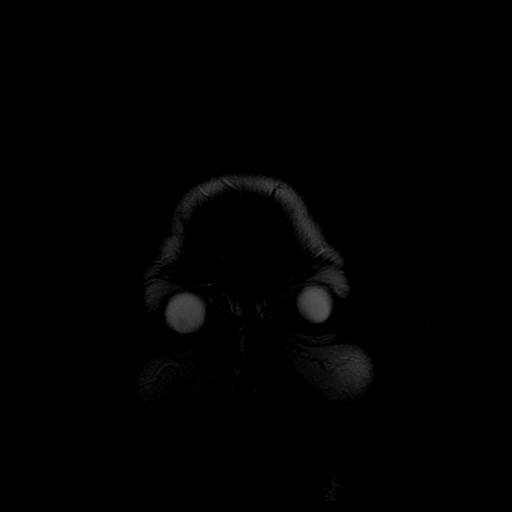

[Series 400: DWI · axial · 5.0mm · 1.09mm/px · z∈[-86,+59]mm · 4 of 28 slices shown (3 of 4)]
[im 1/28]
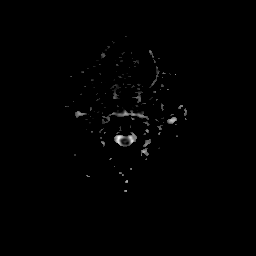
[im 10/28]
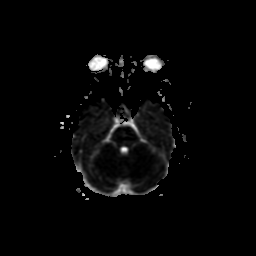
[im 19/28]
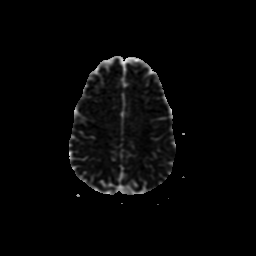
[im 28/28]
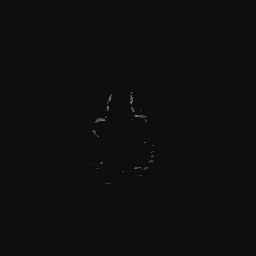

[Series 500: DWI · coronal · 5.0mm · 1.09mm/px · 5 of 35 slices shown (4 of 4)]
[im 1/35]
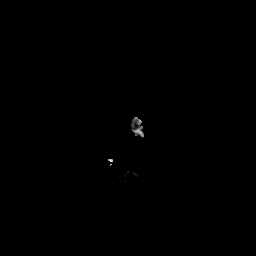
[im 9/35]
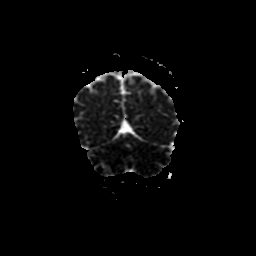
[im 18/35]
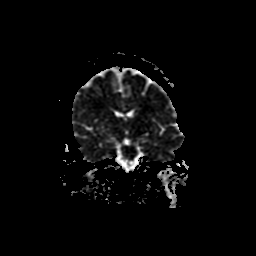
[im 26/35]
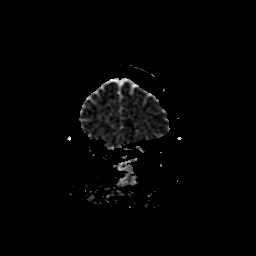
[im 35/35]
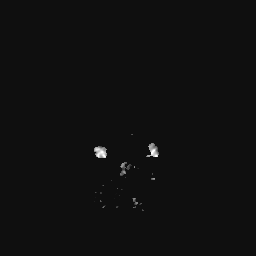

[37 of 48 positions shown; findings below may reference images not displayed]

FINDINGS: Note is again made that the cerebellar tonsils extend to the foramen
magnum without a significant Chiari malformation. The posterior
fossa is within normal limits. Midline structures are otherwise
normal.

No acute infarct, hemorrhage, or mass lesion is present. There is
slight progression of mild periventricular and subcortical white
matter T2 signal hyperintensity bilaterally.

Flow is present in the major intracranial arteries. The patient is
status post bilateral lens extractions. The paranasal sinuses and
mastoid air cells are clear.
IMPRESSION: 1. Slight progression and very mild periventricular and subcortical
white matter disease. The finding is nonspecific but can be seen in
the setting of chronic microvascular ischemia, a demyelinating
process such as multiple sclerosis, vasculitis, complicated migraine
headaches, or as the sequelae of a prior infectious or inflammatory
process.
2. No acute or focal intracranial abnormality to explain the
patient's symptoms otherwise.

## 2014-10-09 ENCOUNTER — Emergency Department (HOSPITAL_COMMUNITY): Payer: Medicaid Other

## 2014-10-09 ENCOUNTER — Encounter (HOSPITAL_COMMUNITY): Payer: Self-pay | Admitting: Emergency Medicine

## 2014-10-09 ENCOUNTER — Emergency Department (HOSPITAL_COMMUNITY)
Admission: EM | Admit: 2014-10-09 | Discharge: 2014-10-10 | Disposition: A | Discharge status: Home / Self Care | Payer: Medicaid Other | Attending: Emergency Medicine | Admitting: Emergency Medicine

## 2014-10-09 DIAGNOSIS — E669 Obesity, unspecified: Secondary | ICD-10-CM | POA: Insufficient documentation

## 2014-10-09 DIAGNOSIS — Z9981 Dependence on supplemental oxygen: Secondary | ICD-10-CM | POA: Insufficient documentation

## 2014-10-09 DIAGNOSIS — M25552 Pain in left hip: Secondary | ICD-10-CM

## 2014-10-09 DIAGNOSIS — M797 Fibromyalgia: Secondary | ICD-10-CM | POA: Insufficient documentation

## 2014-10-09 DIAGNOSIS — Z7982 Long term (current) use of aspirin: Secondary | ICD-10-CM | POA: Diagnosis not present

## 2014-10-09 DIAGNOSIS — I10 Essential (primary) hypertension: Secondary | ICD-10-CM | POA: Diagnosis not present

## 2014-10-09 DIAGNOSIS — E780 Pure hypercholesterolemia: Secondary | ICD-10-CM | POA: Insufficient documentation

## 2014-10-09 DIAGNOSIS — Z8719 Personal history of other diseases of the digestive system: Secondary | ICD-10-CM | POA: Insufficient documentation

## 2014-10-09 DIAGNOSIS — Z79899 Other long term (current) drug therapy: Secondary | ICD-10-CM | POA: Insufficient documentation

## 2014-10-09 DIAGNOSIS — R05 Cough: Secondary | ICD-10-CM | POA: Insufficient documentation

## 2014-10-09 DIAGNOSIS — G8929 Other chronic pain: Secondary | ICD-10-CM | POA: Insufficient documentation

## 2014-10-09 DIAGNOSIS — M1990 Unspecified osteoarthritis, unspecified site: Secondary | ICD-10-CM | POA: Diagnosis not present

## 2014-10-09 DIAGNOSIS — Z8673 Personal history of transient ischemic attack (TIA), and cerebral infarction without residual deficits: Secondary | ICD-10-CM | POA: Insufficient documentation

## 2014-10-09 DIAGNOSIS — G4730 Sleep apnea, unspecified: Secondary | ICD-10-CM | POA: Insufficient documentation

## 2014-10-09 LAB — CBC WITH DIFFERENTIAL/PLATELET
Basophils Absolute: 0 10*3/uL (ref 0.0–0.1)
Basophils Relative: 0 % (ref 0–1)
Eosinophils Absolute: 0.1 10*3/uL (ref 0.0–0.7)
Eosinophils Relative: 1 % (ref 0–5)
HCT: 38.9 % (ref 36.0–46.0)
Hemoglobin: 12.4 g/dL (ref 12.0–15.0)
Lymphocytes Relative: 22 % (ref 12–46)
Lymphs Abs: 1.8 10*3/uL (ref 0.7–4.0)
MCH: 28.4 pg (ref 26.0–34.0)
MCHC: 31.9 g/dL (ref 30.0–36.0)
MCV: 89 fL (ref 78.0–100.0)
Monocytes Absolute: 1.2 10*3/uL — ABNORMAL HIGH (ref 0.1–1.0)
Monocytes Relative: 14 % — ABNORMAL HIGH (ref 3–12)
Neutro Abs: 5.2 10*3/uL (ref 1.7–7.7)
Neutrophils Relative %: 63 % (ref 43–77)
Platelets: 244 10*3/uL (ref 150–400)
RBC: 4.37 MIL/uL (ref 3.87–5.11)
RDW: 14.7 % (ref 11.5–15.5)
WBC: 8.4 10*3/uL (ref 4.0–10.5)

## 2014-10-09 LAB — URINALYSIS, ROUTINE W REFLEX MICROSCOPIC
Bilirubin Urine: NEGATIVE
Glucose, UA: NEGATIVE mg/dL
Hgb urine dipstick: NEGATIVE
Ketones, ur: NEGATIVE mg/dL
Leukocytes, UA: NEGATIVE
Nitrite: NEGATIVE
Protein, ur: NEGATIVE mg/dL
Specific Gravity, Urine: 1.015 (ref 1.005–1.030)
Urobilinogen, UA: 1 mg/dL (ref 0.0–1.0)
pH: 5.5 (ref 5.0–8.0)

## 2014-10-09 LAB — BASIC METABOLIC PANEL
Anion gap: 8 (ref 5–15)
BUN: 11 mg/dL (ref 6–23)
CO2: 30 mmol/L (ref 19–32)
Calcium: 8.4 mg/dL (ref 8.4–10.5)
Chloride: 93 mEq/L — ABNORMAL LOW (ref 96–112)
Creatinine, Ser: 1.11 mg/dL — ABNORMAL HIGH (ref 0.50–1.10)
GFR calc Af Amer: 64 mL/min — ABNORMAL LOW (ref >90)
GFR calc non Af Amer: 55 mL/min — ABNORMAL LOW (ref >90)
Glucose, Bld: 114 mg/dL — ABNORMAL HIGH (ref 70–99)
Potassium: 3.5 mmol/L (ref 3.5–5.1)
Sodium: 131 mmol/L — ABNORMAL LOW (ref 135–145)

## 2014-10-09 MED ORDER — SODIUM CHLORIDE 0.9 % IV BOLUS (SEPSIS)
1000.0000 mL | Freq: Once | INTRAVENOUS | Status: AC
  Administered 2014-10-09: 1000 mL via INTRAVENOUS

## 2014-10-09 MED ORDER — HYDROMORPHONE HCL 1 MG/ML IJ SOLN
1.0000 mg | Freq: Once | INTRAMUSCULAR | Status: DC
  Filled 2014-10-09: qty 1

## 2014-10-09 MED ORDER — HYDROCODONE-ACETAMINOPHEN 5-325 MG PO TABS: 1.0000 | ORAL_TABLET | Freq: Four times a day (QID) | ORAL | Status: DC | PRN

## 2014-10-09 MED ORDER — HYDROMORPHONE HCL 1 MG/ML IJ SOLN
1.0000 mg | Freq: Once | INTRAMUSCULAR | Status: AC
  Administered 2014-10-10: 1 mg via INTRAMUSCULAR

## 2014-10-09 MED ORDER — PREDNISONE 50 MG PO TABS: 50.0000 mg | ORAL_TABLET | Freq: Every day | ORAL | Status: DC

## 2014-10-09 MED ORDER — MORPHINE SULFATE 4 MG/ML IJ SOLN
4.0000 mg | Freq: Once | INTRAMUSCULAR | Status: AC
  Administered 2014-10-09: 4 mg via INTRAVENOUS
  Filled 2014-10-09: qty 1

## 2014-10-09 NOTE — ED Notes (Signed)
Pt c/o left hip pain that radiates down her left leg x 3 days.  Pt also c/o cough over past couple weeks with little yellow phlegm.  Pt states that she has fibromyalgia.

## 2014-10-09 NOTE — Discharge Instructions (Signed)
Return here as needed. Follow up with your doctor. Use ice and heat on the area that is sore.  °

## 2014-10-09 NOTE — ED Notes (Signed)
Urine collected.

## 2014-10-09 NOTE — ED Provider Notes (Signed)
CSN: [id number]     Arrival date & time 10/09/14  1753 History   First MD Initiated Contact with Patient 10/09/14 1941     Chief Complaint  Patient presents with  . Hip Pain  . Leg Pain  . coughing      (Consider location/radiation/quality/duration/timing/severity/associated sxs/prior Treatment) HPI Patient presents to the emergency department with left hip pain that radiates down to her knee.  She is also complaining of some cough with sputum production.  The patient states that she has fibromyalgia and that her pain is always present, but this left hip pain was new.  She denies having back pain other than the fact that she has chronic lower back pains.  Patient states that she has not had any fever, nausea, vomiting, weakness, dizziness, numbness, shortness of breath, abdominal pain, chest pain, rash, or syncope.  The patient states that she did not take any other medications other than her prescribed medications prior to arrival.  Patient states that movement and palpation make the pain worse Past Medical History  Diagnosis Date  . Hypertension   . Hypercholesteremia   . Lower back pain   . Nontoxic uninodular goiter     sees dr vollmer at [facility]  . Calculus of gallbladder without mention of cholecystitis or obstruction   . Arthritis   . Stroke 2000  . Migraine   . Sleep apnea     STOPBANG=5  . Fibromyalgia   . Obesity    Past Surgical History  Procedure Laterality Date  . Knee arthroscopy  one 1995 and 1 in 1997    both knees done  . Abdominal hysterectomy    . Cesarean section  yrs ago    done x 2  . Surgery for endometriosis  yrs ago  . Thryoid biopsy  December 01, 2011     at [facility]  . Cholecystectomy  01/05/2012    Procedure: LAPAROSCOPIC CHOLECYSTECTOMY WITH INTRAOPERATIVE CHOLANGIOGRAM;  Surgeon: Pedro Earls, MD;  Location: [facility];  Service: General;  Laterality: N/A;  . Colonoscopy  10/08/2012    Procedure: COLONOSCOPY;  Surgeon: Beryle Beams, MD;  Location: [facility];  Service: Endoscopy;  Laterality: N/A;   Family History  Problem Relation Age of Onset  . Cancer Brother     colon and lung  . Cancer Maternal Grandmother     colon   History  Substance Use Topics  . Smoking status: Never Smoker   . Smokeless tobacco: Never Used  . Alcohol Use: No   OB History    No data available     Review of Systems  All other systems negative except as documented in the HPI. All pertinent positives and negatives as reviewed in the HPI.  Allergies  Shrimp and Naproxen  Home Medications   Prior to Admission medications   Medication Sig Start Date End Date Taking? Authorizing Provider  aspirin EC 81 MG EC tablet Take 1 tablet (81 mg total) by mouth daily. 03/13/14  Yes Clent Demark, MD  cholecalciferol (VITAMIN D) 1000 UNITS tablet Take 1,000 Units by mouth daily.   Yes Historical Provider, MD  dextromethorphan (DELSYM) 30 MG/5ML liquid Take 15 mg by mouth daily as needed for cough (cough).   Yes Historical Provider, MD  DULoxetine (CYMBALTA) 60 MG capsule Take 60 mg by mouth 2 (two) times daily.    Yes Historical Provider, MD  fluticasone (FLONASE) 50 MCG/ACT nasal spray Place 2 sprays into both nostrils daily as needed for  allergies (allergies).    Yes Historical Provider, MD  lisinopril (PRINIVIL) 10 MG tablet Take 1 tablet (10 mg total) by mouth daily. 08/01/14  Yes Verlee Monte, MD  loratadine (CLARITIN) 10 MG tablet Take 10 mg by mouth every morning.    Yes Historical Provider, MD  metoprolol succinate (TOPROL-XL) 100 MG 24 hr tablet Take 1 tablet (100 mg total) by mouth daily. Take with or immediately following a meal. Patient taking differently: Take 100 mg by mouth at bedtime. Take with or immediately following a meal. 03/13/14  Yes Clent Demark, MD  Oxycodone HCl 10 MG TABS Take 10 mg by mouth 2 (two) times daily as needed (pain).   Yes Historical Provider, MD  pantoprazole (PROTONIX) 40 MG tablet Take 40 mg by mouth at bedtime.   Yes  Historical Provider, MD  pregabalin (LYRICA) 25 MG capsule Take 25 mg by mouth 3 (three) times daily.   Yes Historical Provider, MD  tiZANidine (ZANAFLEX) 4 MG tablet Take 4 mg by mouth every 6 (six) hours as needed for muscle spasms (muscle spasms).    Yes Historical Provider, MD  topiramate (TOPAMAX) 25 MG tablet Take 75 mg by mouth 2 (two) times daily.    Yes Historical Provider, MD  nitroGLYCERIN (NITROSTAT) 0.4 MG SL tablet Place 1 tablet (0.4 mg total) under the tongue every 5 (five) minutes x 3 doses as needed for chest pain. 03/13/14   Clent Demark, MD   BP 137/81 mmHg  Pulse 110  Temp(Src) 99.7 F (37.6 C) (Oral)  Resp 20  SpO2 100% Physical Exam  Constitutional: She is oriented to person, place, and time. She appears well-developed and well-nourished. No distress.  HENT:  Head: Normocephalic and atraumatic.  Mouth/Throat: Oropharynx is clear and moist.  Eyes: Pupils are equal, round, and reactive to light.  Neck: Normal range of motion. Neck supple.  Cardiovascular: Normal rate, regular rhythm and normal heart sounds.  Exam reveals no gallop and no friction rub.   No murmur heard. Pulmonary/Chest: Effort normal and breath sounds normal. No respiratory distress. She has no wheezes.  Musculoskeletal: She exhibits no edema.       Legs: Neurological: She is alert and oriented to person, place, and time. She exhibits normal muscle tone. Coordination normal.  Skin: Skin is warm and dry. No rash noted. No erythema.    ED Course  Procedures (including critical care time) Labs Review Labs Reviewed  CBC WITH DIFFERENTIAL - Abnormal; Notable for the following:    Monocytes Relative 14 (*)    Monocytes Absolute 1.2 (*)    All other components within normal limits  BASIC METABOLIC PANEL - Abnormal; Notable for the following:    Sodium 131 (*)    Chloride 93 (*)    Glucose, Bld 114 (*)    Creatinine, Ser 1.11 (*)    GFR calc non Af Amer 55 (*)    GFR calc Af Amer 64 (*)     All other components within normal limits  URINALYSIS, ROUTINE W REFLEX MICROSCOPIC    Imaging Review Dg Chest 2 View  10/09/2014   CLINICAL DATA:  Productive cough.  Fibromyalgia.  Hypertension.  EXAM: CHEST  2 VIEW  COMPARISON:  03/12/2014  FINDINGS: Low lung volumes are noted. Central peribronchial thickening seen. No evidence of pulmonary airspace disease or pleural effusion. Heart size is normal. No mass or lymphadenopathy identified.  IMPRESSION: Low lung volumes and central peribronchial thickening. No focal airspace disease or consolidation.  Electronically Signed   By: Earle Gell M.D.   On: 10/09/2014 21:49    Patient be treated for radiculopathy versus an IT band irritation.  Patient will be referred back to her primary care Dr. told to return here as needed.  Told to use ice and heat on the area that is sore.  The chest x-ray did not show any signs of pneumonia.  Advised her to rest and increase her fluid intake  MDM   Final diagnoses:  None       Brent General, PA-C 10/14/14 [place], MD 10/18/14 1325

## 2014-11-07 ENCOUNTER — Emergency Department (HOSPITAL_COMMUNITY)
Admission: EM | Admit: 2014-11-07 | Discharge: 2014-11-07 | Disposition: A | Discharge status: Home / Self Care | Payer: Medicaid Other | Attending: Emergency Medicine | Admitting: Emergency Medicine

## 2014-11-07 ENCOUNTER — Encounter (HOSPITAL_COMMUNITY): Payer: Self-pay | Admitting: Emergency Medicine

## 2014-11-07 DIAGNOSIS — M545 Low back pain, unspecified: Secondary | ICD-10-CM

## 2014-11-07 DIAGNOSIS — Z7952 Long term (current) use of systemic steroids: Secondary | ICD-10-CM | POA: Diagnosis not present

## 2014-11-07 DIAGNOSIS — Z8719 Personal history of other diseases of the digestive system: Secondary | ICD-10-CM | POA: Insufficient documentation

## 2014-11-07 DIAGNOSIS — G4730 Sleep apnea, unspecified: Secondary | ICD-10-CM | POA: Insufficient documentation

## 2014-11-07 DIAGNOSIS — G43909 Migraine, unspecified, not intractable, without status migrainosus: Secondary | ICD-10-CM | POA: Diagnosis not present

## 2014-11-07 DIAGNOSIS — Z9981 Dependence on supplemental oxygen: Secondary | ICD-10-CM | POA: Insufficient documentation

## 2014-11-07 DIAGNOSIS — M546 Pain in thoracic spine: Secondary | ICD-10-CM | POA: Insufficient documentation

## 2014-11-07 DIAGNOSIS — G8929 Other chronic pain: Secondary | ICD-10-CM | POA: Insufficient documentation

## 2014-11-07 DIAGNOSIS — M25552 Pain in left hip: Secondary | ICD-10-CM | POA: Insufficient documentation

## 2014-11-07 DIAGNOSIS — Z8673 Personal history of transient ischemic attack (TIA), and cerebral infarction without residual deficits: Secondary | ICD-10-CM | POA: Diagnosis not present

## 2014-11-07 DIAGNOSIS — M25551 Pain in right hip: Secondary | ICD-10-CM | POA: Diagnosis not present

## 2014-11-07 DIAGNOSIS — Z79899 Other long term (current) drug therapy: Secondary | ICD-10-CM | POA: Diagnosis not present

## 2014-11-07 DIAGNOSIS — M1990 Unspecified osteoarthritis, unspecified site: Secondary | ICD-10-CM | POA: Diagnosis not present

## 2014-11-07 DIAGNOSIS — I10 Essential (primary) hypertension: Secondary | ICD-10-CM | POA: Insufficient documentation

## 2014-11-07 DIAGNOSIS — M797 Fibromyalgia: Secondary | ICD-10-CM | POA: Insufficient documentation

## 2014-11-07 DIAGNOSIS — E669 Obesity, unspecified: Secondary | ICD-10-CM | POA: Insufficient documentation

## 2014-11-07 DIAGNOSIS — Z7982 Long term (current) use of aspirin: Secondary | ICD-10-CM | POA: Diagnosis not present

## 2014-11-07 MED ORDER — KETOROLAC TROMETHAMINE 60 MG/2ML IM SOLN
60.0000 mg | Freq: Once | INTRAMUSCULAR | Status: AC
  Administered 2014-11-07: 60 mg via INTRAMUSCULAR
  Filled 2014-11-07: qty 2

## 2014-11-07 NOTE — ED Notes (Signed)
Pt c/o increasingly worsening bilateral lower back and hip pain. Takes multiple medications for nerve pain and pain (oxycodone last taken at 1500, Cymbalta, etc). Endorses numbness in the right leg. Ambulatory with steady gait. Awaiting PA.

## 2014-11-07 NOTE — ED Provider Notes (Signed)
CSN: [id number]     Arrival date & time 11/07/14  1821 History  This chart was scribed for non-physician practitioner, Britt Bottom, NP, working with Ephraim Hamburger, MD, by Jeanell Sparrow, ED Scribe. This patient was seen in room WTR5/WTR5 and the patient's care was started at 8:12 PM.   Chief Complaint  Patient presents with  . Back Pain    bilateral  . Hip Pain    bilateral   The history is provided by the patient. No language interpreter was used.   HPI Comments: Jodi Bowman is a 54 y.o. female with a hx of fibromyalgia who presents to the Emergency Department complaining of constant moderate bilateral lower back pain that started yesterday. She states that her pain in chronic due to her fibromyalgia. She reports that she also has associated constant moderate bilateral hip pain. She reports that the pain started yesterday and has worsened. She states that she has a back injection scheduled in about 2 weeks and needs something for her pain until then. She denies any hx of IV drug use or cancer. She also denies any bowel or bladder incontinence.   Past Medical History  Diagnosis Date  . Hypertension   . Hypercholesteremia   . Lower back pain   . Nontoxic uninodular goiter     sees dr vollmer at [facility]  . Calculus of gallbladder without mention of cholecystitis or obstruction   . Arthritis   . Stroke 2000  . Migraine   . Sleep apnea     STOPBANG=5  . Fibromyalgia   . Obesity    Past Surgical History  Procedure Laterality Date  . Knee arthroscopy  one 1995 and 1 in 1997    both knees done  . Abdominal hysterectomy    . Cesarean section  yrs ago    done x 2  . Surgery for endometriosis  yrs ago  . Thryoid biopsy  December 01, 2011     [facility]  . Cholecystectomy  01/05/2012    Procedure: LAPAROSCOPIC CHOLECYSTECTOMY WITH INTRAOPERATIVE CHOLANGIOGRAM;  Surgeon: Pedro Earls, MD;  Location: [facility];  Service: General;  Laterality: N/A;  . Colonoscopy  10/08/2012   Procedure: COLONOSCOPY;  Surgeon: Beryle Beams, MD;  Location: [facility];  Service: Endoscopy;  Laterality: N/A;   Family History  Problem Relation Age of Onset  . Cancer Brother     colon and lung  . Cancer Maternal Grandmother     colon   History  Substance Use Topics  . Smoking status: Never Smoker   . Smokeless tobacco: Never Used  . Alcohol Use: No   OB History    No data available     Review of Systems  Respiratory: Negative for shortness of breath.   Cardiovascular: Negative for chest pain.  Musculoskeletal: Positive for back pain.    Allergies  Shrimp and Naproxen  Home Medications   Prior to Admission medications   Medication Sig Start Date End Date Taking? Authorizing Provider  aspirin EC 81 MG EC tablet Take 1 tablet (81 mg total) by mouth daily. 03/13/14   Clent Demark, MD  cholecalciferol (VITAMIN D) 1000 UNITS tablet Take 1,000 Units by mouth daily.    Historical Provider, MD  dextromethorphan (DELSYM) 30 MG/5ML liquid Take 15 mg by mouth daily as needed for cough (cough).    Historical Provider, MD  DULoxetine (CYMBALTA) 60 MG capsule Take 60 mg by mouth 2 (two) times daily.  Historical Provider, MD  fluticasone (FLONASE) 50 MCG/ACT nasal spray Place 2 sprays into both nostrils daily as needed for allergies (allergies).     Historical Provider, MD  HYDROcodone-acetaminophen (NORCO/VICODIN) 5-325 MG per tablet Take 1 tablet by mouth every 6 (six) hours as needed for moderate pain. 10/09/14   Resa Miner Lawyer, PA-C  lisinopril (PRINIVIL) 10 MG tablet Take 1 tablet (10 mg total) by mouth daily. 08/01/14   Verlee Monte, MD  loratadine (CLARITIN) 10 MG tablet Take 10 mg by mouth every morning.     Historical Provider, MD  metoprolol succinate (TOPROL-XL) 100 MG 24 hr tablet Take 1 tablet (100 mg total) by mouth daily. Take with or immediately following a meal. Patient taking differently: Take 100 mg by mouth at bedtime. Take with or immediately following  a meal. 03/13/14   Clent Demark, MD  nitroGLYCERIN (NITROSTAT) 0.4 MG SL tablet Place 1 tablet (0.4 mg total) under the tongue every 5 (five) minutes x 3 doses as needed for chest pain. 03/13/14   Clent Demark, MD  Oxycodone HCl 10 MG TABS Take 10 mg by mouth 2 (two) times daily as needed (pain).    Historical Provider, MD  pantoprazole (PROTONIX) 40 MG tablet Take 40 mg by mouth at bedtime.    Historical Provider, MD  predniSONE (DELTASONE) 50 MG tablet Take 1 tablet (50 mg total) by mouth daily. 10/09/14   [place], PA-C  pregabalin (LYRICA) 25 MG capsule Take 25 mg by mouth 3 (three) times daily.    Historical Provider, MD  tiZANidine (ZANAFLEX) 4 MG tablet Take 4 mg by mouth every 6 (six) hours as needed for muscle spasms (muscle spasms).     Historical Provider, MD  topiramate (TOPAMAX) 25 MG tablet Take 75 mg by mouth 2 (two) times daily.     Historical Provider, MD   BP 157/109 mmHg  Pulse 102  Temp(Src) 97.4 F (36.3 C) (Oral)  Resp 17  SpO2 100% Physical Exam  Constitutional: She is oriented to person, place, and time. She appears well-developed and well-nourished. No distress.  HENT:  Head: Normocephalic and atraumatic.  Neck: Neck supple. No tracheal deviation present.  Cardiovascular: Normal rate.   Pulmonary/Chest: Effort normal. No respiratory distress.  Musculoskeletal: Normal range of motion.  TTP in bilateral lumbar and thoracic paraspinals. TTP in bilateral iliac joints.  Neurological: She is alert and oriented to person, place, and time.  5/5 strength in plantar and dorsal flexion.   Skin: Skin is warm and dry.  Psychiatric: She has a normal mood and affect. Her behavior is normal.  Nursing note and vitals reviewed.   ED Course  Procedures (including critical care time) DIAGNOSTIC STUDIES: Oxygen Saturation is 100% on RA, normal by my interpretation.    COORDINATION OF CARE: 8:16 PM- Pt advised of plan for treatment which includes medication and  pt agrees.  Labs Review Labs Reviewed - No data to display  Imaging Review No results found.   EKG Interpretation None      MDM   Final diagnoses:  Bilateral low back pain without sciatica   54 yo with chronic back pain reportedly due to her fibromyalgia, no new injury and no red flags including no neurological deficits, no loss of bowel or bladder control, no fever, night sweats, weight loss, h/o cancer, or  IVDU.  She is able to walk without difficulty. IM shot of toradol given without any undesired effects. Discussed opioid meds not indicated for this  choric pain. Conservative management discussed and pt verbalizes understanding.   I personally performed the services described in this documentation, which was scribed in my presence. The recorded information has been reviewed and is accurate.   Filed Vitals:   11/07/14 1929  BP: 157/109  Pulse: 102  Temp: 97.4 F (36.3 C)  TempSrc: Oral  Resp: 17  SpO2: 100%   Meds given in ED:  Medications  ketorolac (TORADOL) injection 60 mg (60 mg Intramuscular Given 11/07/14 2033)    Discharge Medication List as of 11/07/2014  8:30 PM          Britt Bottom, NP 11/08/14 2155  Ephraim Hamburger, MD 11/08/14 2359

## 2014-11-07 NOTE — Discharge Instructions (Signed)
Please follow the directions provided.  Be sure to follow-up with your primary care provider to ensure you are getting better.  Please take your prescribed medicines as directed.  Don't hesitate to return for any new, worsening or concerning symptoms.   SEEK IMMEDIATE MEDICAL CARE IF:  You have pain that radiates from your back into your legs.  You develop new bowel or bladder control problems.  You have unusual weakness or numbness in your arms or legs.  You develop nausea or vomiting.  You develop abdominal pain.  You feel faint.

## 2014-12-23 ENCOUNTER — Emergency Department (HOSPITAL_COMMUNITY): Payer: Medicaid Other

## 2014-12-23 ENCOUNTER — Emergency Department (HOSPITAL_COMMUNITY)
Admission: EM | Admit: 2014-12-23 | Discharge: 2014-12-23 | Disposition: A | Discharge status: Home / Self Care | Payer: Medicaid Other | Attending: Emergency Medicine | Admitting: Emergency Medicine

## 2014-12-23 DIAGNOSIS — X58XXXA Exposure to other specified factors, initial encounter: Secondary | ICD-10-CM | POA: Insufficient documentation

## 2014-12-23 DIAGNOSIS — Z79899 Other long term (current) drug therapy: Secondary | ICD-10-CM | POA: Diagnosis not present

## 2014-12-23 DIAGNOSIS — G43909 Migraine, unspecified, not intractable, without status migrainosus: Secondary | ICD-10-CM | POA: Diagnosis not present

## 2014-12-23 DIAGNOSIS — Y998 Other external cause status: Secondary | ICD-10-CM | POA: Diagnosis not present

## 2014-12-23 DIAGNOSIS — M797 Fibromyalgia: Secondary | ICD-10-CM | POA: Diagnosis not present

## 2014-12-23 DIAGNOSIS — Z8639 Personal history of other endocrine, nutritional and metabolic disease: Secondary | ICD-10-CM | POA: Insufficient documentation

## 2014-12-23 DIAGNOSIS — S29012A Strain of muscle and tendon of back wall of thorax, initial encounter: Secondary | ICD-10-CM | POA: Diagnosis not present

## 2014-12-23 DIAGNOSIS — M1990 Unspecified osteoarthritis, unspecified site: Secondary | ICD-10-CM | POA: Insufficient documentation

## 2014-12-23 DIAGNOSIS — Y9389 Activity, other specified: Secondary | ICD-10-CM | POA: Diagnosis not present

## 2014-12-23 DIAGNOSIS — Y9289 Other specified places as the place of occurrence of the external cause: Secondary | ICD-10-CM | POA: Insufficient documentation

## 2014-12-23 DIAGNOSIS — S3992XA Unspecified injury of lower back, initial encounter: Secondary | ICD-10-CM | POA: Diagnosis not present

## 2014-12-23 DIAGNOSIS — Z7982 Long term (current) use of aspirin: Secondary | ICD-10-CM | POA: Diagnosis not present

## 2014-12-23 DIAGNOSIS — S24109A Unspecified injury at unspecified level of thoracic spinal cord, initial encounter: Secondary | ICD-10-CM | POA: Diagnosis present

## 2014-12-23 DIAGNOSIS — Z9981 Dependence on supplemental oxygen: Secondary | ICD-10-CM | POA: Insufficient documentation

## 2014-12-23 DIAGNOSIS — I10 Essential (primary) hypertension: Secondary | ICD-10-CM | POA: Diagnosis not present

## 2014-12-23 DIAGNOSIS — Z8673 Personal history of transient ischemic attack (TIA), and cerebral infarction without residual deficits: Secondary | ICD-10-CM | POA: Insufficient documentation

## 2014-12-23 DIAGNOSIS — G4730 Sleep apnea, unspecified: Secondary | ICD-10-CM | POA: Diagnosis not present

## 2014-12-23 DIAGNOSIS — Z7952 Long term (current) use of systemic steroids: Secondary | ICD-10-CM | POA: Diagnosis not present

## 2014-12-23 MED ORDER — KETOROLAC TROMETHAMINE 60 MG/2ML IM SOLN
60.0000 mg | Freq: Once | INTRAMUSCULAR | Status: AC
  Administered 2014-12-23: 60 mg via INTRAMUSCULAR
  Filled 2014-12-23: qty 2

## 2014-12-23 NOTE — Discharge Instructions (Signed)
Rest, apply ice intermittently for the next 24 hours followed by heat. Avoid heavy lifting or hard physical activity.  Muscle Strain A muscle strain is an injury that occurs when a muscle is stretched beyond its normal length. Usually a small number of muscle fibers are torn when this happens. Muscle strain is rated in degrees. First-degree strains have the least amount of muscle fiber tearing and pain. Second-degree and third-degree strains have increasingly more tearing and pain.  Usually, recovery from muscle strain takes 1-2 weeks. Complete healing takes 5-6 weeks.  CAUSES  Muscle strain happens when a sudden, violent force placed on a muscle stretches it too far. This may occur with lifting, sports, or a fall.  RISK FACTORS Muscle strain is especially common in athletes.  SIGNS AND SYMPTOMS At the site of the muscle strain, there may be:  Pain.  Bruising.  Swelling.  Difficulty using the muscle due to pain or lack of normal function. DIAGNOSIS  Your health care provider will perform a physical exam and ask about your medical history. TREATMENT  Often, the best treatment for a muscle strain is resting, icing, and applying cold compresses to the injured area.  HOME CARE INSTRUCTIONS   Use the PRICE method of treatment to promote muscle healing during the first 2-3 days after your injury. The PRICE method involves:  Protecting the muscle from being injured again.  Restricting your activity and resting the injured body part.  Icing your injury. To do this, put ice in a plastic bag. Place a towel between your skin and the bag. Then, apply the ice and leave it on from 15-20 minutes each hour. After the third day, switch to moist heat packs.  Apply compression to the injured area with a splint or elastic bandage. Be careful not to wrap it too tightly. This may interfere with blood circulation or increase swelling.  Elevate the injured body part above the level of your heart as often  as you can.  Only take over-the-counter or prescription medicines for pain, discomfort, or fever as directed by your health care provider.  Warming up prior to exercise helps to prevent future muscle strains. SEEK MEDICAL CARE IF:   You have increasing pain or swelling in the injured area.  You have numbness, tingling, or a significant loss of strength in the injured area. MAKE SURE YOU:   Understand these instructions.  Will watch your condition.  Will get help right away if you are not doing well or get worse. Document Released: 09/15/2005 Document Revised: 07/06/2013 Document Reviewed: 04/14/2013 [facility] Patient Information 2015 [place], [place]. This information is not intended to replace advice given to you by your health care provider. Make sure you discuss any questions you have with your health care provider.  Back Pain, Adult Low back pain is very common. About 1 in 5 people have back pain.The cause of low back pain is rarely dangerous. The pain often gets better over time.About half of people with a sudden onset of back pain feel better in just 2 weeks. About 8 in 10 people feel better by 6 weeks.  CAUSES Some common causes of back pain include:  Strain of the muscles or ligaments supporting the spine.  Wear and tear (degeneration) of the spinal discs.  Arthritis.  Direct injury to the back. DIAGNOSIS Most of the time, the direct cause of low back pain is not known.However, back pain can be treated effectively even when the exact cause of the pain is unknown.Answering  your caregiver's questions about your overall health and symptoms is one of the most accurate ways to make sure the cause of your pain is not dangerous. If your caregiver needs more information, he or she may order lab work or imaging tests (X-rays or MRIs).However, even if imaging tests show changes in your back, this usually does not require surgery. HOME CARE INSTRUCTIONS For many people, back pain  returns.Since low back pain is rarely dangerous, it is often a condition that people can learn to [facility] their own.   Remain active. It is stressful on the back to sit or stand in one place. Do not sit, drive, or stand in one place for more than 30 minutes at a time. Take short walks on level surfaces as soon as pain allows.Try to increase the length of time you walk each day.  Do not stay in bed.Resting more than 1 or 2 days can delay your recovery.  Do not avoid exercise or work.Your body is made to move.It is not dangerous to be active, even though your back may hurt.Your back will likely heal faster if you return to being active before your pain is gone.  Pay attention to your body when you bend and lift. Many people have less discomfortwhen lifting if they bend their knees, keep the load close to their bodies,and avoid twisting. Often, the most comfortable positions are those that put less stress on your recovering back.  Find a comfortable position to sleep. Use a firm mattress and lie on your side with your knees slightly bent. If you lie on your back, put a pillow under your knees.  Only take over-the-counter or prescription medicines as directed by your caregiver. Over-the-counter medicines to reduce pain and inflammation are often the most helpful.Your caregiver may prescribe muscle relaxant drugs.These medicines help dull your pain so you can more quickly return to your normal activities and healthy exercise.  Put ice on the injured area.  Put ice in a plastic bag.  Place a towel between your skin and the bag.  Leave the ice on for 15-20 minutes, 03-04 times a day for the first 2 to 3 days. After that, ice and heat may be alternated to reduce pain and spasms.  Ask your caregiver about trying back exercises and gentle massage. This may be of some benefit.  Avoid feeling anxious or stressed.Stress increases muscle tension and can worsen back pain.It is important to  recognize when you are anxious or stressed and learn ways to manage it.Exercise is a great option. SEEK MEDICAL CARE IF:  You have pain that is not relieved with rest or medicine.  You have pain that does not improve in 1 week.  You have new symptoms.  You are generally not feeling well. SEEK IMMEDIATE MEDICAL CARE IF:   You have pain that radiates from your back into your legs.  You develop new bowel or bladder control problems.  You have unusual weakness or numbness in your arms or legs.  You develop nausea or vomiting.  You develop abdominal pain.  You feel faint. Document Released: 09/15/2005 Document Revised: 03/16/2012 Document Reviewed: 01/17/2014 [facility] Patient Information 2015 [place], [place]. This information is not intended to replace advice given to you by your health care provider. Make sure you discuss any questions you have with your health care provider.
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
## 2015-01-14 ENCOUNTER — Encounter (HOSPITAL_COMMUNITY): Payer: Self-pay

## 2015-01-14 ENCOUNTER — Emergency Department (HOSPITAL_COMMUNITY)
Admission: EM | Admit: 2015-01-14 | Discharge: 2015-01-14 | Disposition: A | Discharge status: Home / Self Care | Payer: Medicaid Other | Attending: Emergency Medicine | Admitting: Emergency Medicine

## 2015-01-14 DIAGNOSIS — I10 Essential (primary) hypertension: Secondary | ICD-10-CM | POA: Diagnosis not present

## 2015-01-14 DIAGNOSIS — M62838 Other muscle spasm: Secondary | ICD-10-CM | POA: Diagnosis not present

## 2015-01-14 DIAGNOSIS — G43909 Migraine, unspecified, not intractable, without status migrainosus: Secondary | ICD-10-CM | POA: Insufficient documentation

## 2015-01-14 DIAGNOSIS — Z79899 Other long term (current) drug therapy: Secondary | ICD-10-CM | POA: Insufficient documentation

## 2015-01-14 DIAGNOSIS — Z7982 Long term (current) use of aspirin: Secondary | ICD-10-CM | POA: Diagnosis not present

## 2015-01-14 DIAGNOSIS — Z7952 Long term (current) use of systemic steroids: Secondary | ICD-10-CM | POA: Diagnosis not present

## 2015-01-14 DIAGNOSIS — M1990 Unspecified osteoarthritis, unspecified site: Secondary | ICD-10-CM | POA: Insufficient documentation

## 2015-01-14 DIAGNOSIS — Z8673 Personal history of transient ischemic attack (TIA), and cerebral infarction without residual deficits: Secondary | ICD-10-CM | POA: Insufficient documentation

## 2015-01-14 DIAGNOSIS — Z8639 Personal history of other endocrine, nutritional and metabolic disease: Secondary | ICD-10-CM | POA: Insufficient documentation

## 2015-01-14 DIAGNOSIS — E669 Obesity, unspecified: Secondary | ICD-10-CM | POA: Diagnosis not present

## 2015-01-14 DIAGNOSIS — Z8669 Personal history of other diseases of the nervous system and sense organs: Secondary | ICD-10-CM | POA: Insufficient documentation

## 2015-01-14 DIAGNOSIS — M79661 Pain in right lower leg: Secondary | ICD-10-CM | POA: Diagnosis present

## 2015-01-14 LAB — I-STAT CHEM 8, ED
BUN: 18 mg/dL (ref 6–23)
Calcium, Ion: 1.16 mmol/L (ref 1.12–1.23)
Chloride: 101 mmol/L (ref 96–112)
Creatinine, Ser: 1.1 mg/dL (ref 0.50–1.10)
Glucose, Bld: 90 mg/dL (ref 70–99)
HCT: 50 % — ABNORMAL HIGH (ref 36.0–46.0)
Hemoglobin: 17 g/dL — ABNORMAL HIGH (ref 12.0–15.0)
Potassium: 4 mmol/L (ref 3.5–5.1)
Sodium: 139 mmol/L (ref 135–145)
TCO2: 24 mmol/L (ref 0–100)

## 2015-01-14 LAB — D-DIMER, QUANTITATIVE: D-Dimer, Quant: <0.27 ug/mL-FEU (ref 0.00–0.48)

## 2015-01-14 MED ORDER — DIAZEPAM 5 MG PO TABS
5.0000 mg | ORAL_TABLET | Freq: Once | ORAL | Status: AC
Start: 2015-01-14 — End: 2015-01-14
  Administered 2015-01-14: 5 mg via ORAL
  Filled 2015-01-14: qty 1
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
## 2015-01-14 NOTE — ED Provider Notes (Signed)
CSN: [id number]     Arrival date & time 01/14/15  1519 History   First MD Initiated Contact with Patient 01/14/15 1535     Chief Complaint  Patient presents with  . Spasms     The history is provided by the patient. No language interpreter was used.   Jodi Bowman presents for evaluation of muscle spasms. She states that she developed muscle spasms and bilateral calves, right greater than left. She also has muscle spasms throughout her right back and right side. She has not had similar symptoms previously. Spasms are waxing and waning and they're worse with movement and position. She drove from [place] two days ago and was in a car for 4 hours. She denies any chest pain, shortness of breath, cough, abdominal pain, nausea, vomiting, dysuria, hematuria. She has no history of DVT, no recent surgery or hospitalization, no oral estrogen use. Symptoms are moderate, constant, worsening.  Past Medical History  Diagnosis Date  . Hypertension   . Hypercholesteremia   . Lower back pain   . Nontoxic uninodular goiter     sees dr vollmer at [facility]  . Calculus of gallbladder without mention of cholecystitis or obstruction   . Arthritis   . Stroke 2000  . Migraine   . Sleep apnea     STOPBANG=5  . Fibromyalgia   . Obesity    Past Surgical History  Procedure Laterality Date  . Knee arthroscopy  one 1995 and 1 in 1997    both knees done  . Abdominal hysterectomy    . Cesarean section  yrs ago    done x 2  . Surgery for endometriosis  yrs ago  . Thryoid biopsy  December 01, 2011     [facility]  . Cholecystectomy  01/05/2012    Procedure: LAPAROSCOPIC CHOLECYSTECTOMY WITH INTRAOPERATIVE CHOLANGIOGRAM;  Surgeon: Pedro Earls, MD;  Location: [facility];  Service: General;  Laterality: N/A;  . Colonoscopy  10/08/2012    Procedure: COLONOSCOPY;  Surgeon: Beryle Beams, MD;  Location: [facility];  Service: Endoscopy;  Laterality: N/A;   Family History  Problem Relation Age of Onset  . Cancer  Brother     colon and lung  . Cancer Maternal Grandmother     colon   History  Substance Use Topics  . Smoking status: Never Smoker   . Smokeless tobacco: Never Used  . Alcohol Use: No   OB History    No data available     Review of Systems  All other systems reviewed and are negative.     Allergies  Shrimp and Naproxen  Home Medications   Prior to Admission medications   Medication Sig Start Date End Date Taking? Authorizing Provider  aspirin EC 81 MG EC tablet Take 1 tablet (81 mg total) by mouth daily. 03/13/14   Charolette Forward, MD  cholecalciferol (VITAMIN D) 1000 UNITS tablet Take 1,000 Units by mouth daily.    Historical Provider, MD  dextromethorphan (DELSYM) 30 MG/5ML liquid Take 15 mg by mouth daily as needed for cough (cough).    Historical Provider, MD  DULoxetine (CYMBALTA) 60 MG capsule Take 60 mg by mouth 2 (two) times daily.     Historical Provider, MD  fluticasone (FLONASE) 50 MCG/ACT nasal spray Place 2 sprays into both nostrils daily as needed for allergies (allergies).     Historical Provider, MD  HYDROcodone-acetaminophen (NORCO/VICODIN) 5-325 MG per tablet Take 1 tablet by mouth every 6 (six) hours as needed for moderate  pain. 10/09/14   Dalia Heading, PA-C  lisinopril (PRINIVIL) 10 MG tablet Take 1 tablet (10 mg total) by mouth daily. 08/01/14   Verlee Monte, MD  loratadine (CLARITIN) 10 MG tablet Take 10 mg by mouth every morning.     Historical Provider, MD  metoprolol succinate (TOPROL-XL) 100 MG 24 hr tablet Take 1 tablet (100 mg total) by mouth daily. Take with or immediately following a meal. Patient taking differently: Take 100 mg by mouth at bedtime. Take with or immediately following a meal. 03/13/14   Charolette Forward, MD  nitroGLYCERIN (NITROSTAT) 0.4 MG SL tablet Place 1 tablet (0.4 mg total) under the tongue every 5 (five) minutes x 3 doses as needed for chest pain. 03/13/14   Charolette Forward, MD  Oxycodone HCl 10 MG TABS Take 10 mg by mouth 2 (two)  times daily as needed (pain).    Historical Provider, MD  pantoprazole (PROTONIX) 40 MG tablet Take 40 mg by mouth at bedtime.    Historical Provider, MD  predniSONE (DELTASONE) 50 MG tablet Take 1 tablet (50 mg total) by mouth daily. 10/09/14   Dalia Heading, PA-C  pregabalin (LYRICA) 25 MG capsule Take 25 mg by mouth 3 (three) times daily.    Historical Provider, MD  tiZANidine (ZANAFLEX) 4 MG tablet Take 4 mg by mouth every 6 (six) hours as needed for muscle spasms (muscle spasms).     Historical Provider, MD  topiramate (TOPAMAX) 25 MG tablet Take 75 mg by mouth 2 (two) times daily.     Historical Provider, MD   BP 146/75 mmHg  Pulse 104  Temp(Src) 97.6 F (36.4 C) (Oral)  Resp 18  SpO2 100% Physical Exam  Constitutional: She is oriented to person, place, and time. She appears well-developed and well-nourished.  HENT:  Head: Normocephalic and atraumatic.  Cardiovascular: Normal rate and regular rhythm.   No murmur heard. Pulmonary/Chest: Effort normal and breath sounds normal. No respiratory distress.  Abdominal: Soft. There is no tenderness. There is no rebound and no guarding.  Obese  Musculoskeletal: She exhibits no edema or tenderness.  Diffuse right-sided back tenderness to palpation without any discrete bony tenderness. Tenderness to palpation over bilateral calves without any appreciable spasm present. No appreciable lower extremity edema. 2+ DP pulses bilaterally.  Neurological: She is alert and oriented to person, place, and time.  Sensation to light touch intact in bilateral lower extremities. 5 out of 5 strength in bilateral lower extremities.  Skin: Skin is warm and dry.  Psychiatric: She has a normal mood and affect. Her behavior is normal.  Nursing note and vitals reviewed.   ED Course  Procedures (including critical care time) Labs Review Labs Reviewed  I-STAT CHEM 8, ED - Abnormal; Notable for the following:    Hemoglobin 17.0 (*)    HCT 50.0 (*)    All  other components within normal limits  D-DIMER, QUANTITATIVE    Imaging Review No results found.   EKG Interpretation None      MDM   Final diagnoses:  Muscle spasms of both lower extremities    Patient here with muscle spasms of legs and side. Patient does not have any history or exam findings concerning for dissection, PE, pneumonia, UTI, renal colic. Discussed with patient muscle cramps, likely secondary to prolonged travel. Recommend oral fluid hydration and continuing her home medications with gentle activity at home.    Quintella Reichert, MD 01/14/15 2040

## 2015-01-14 NOTE — Discharge Instructions (Signed)

## 2015-01-29 IMAGING — CR DG ANKLE COMPLETE 3+V*L*
3 series · 3 of 3 positions shown · non-contrast
Comparison: None currently available

CLINICAL DATA: Ankle pain.  No history of trauma.

EXAM:
LEFT ANKLE COMPLETE - 3+ VIEW

[t ankle joint ap left]
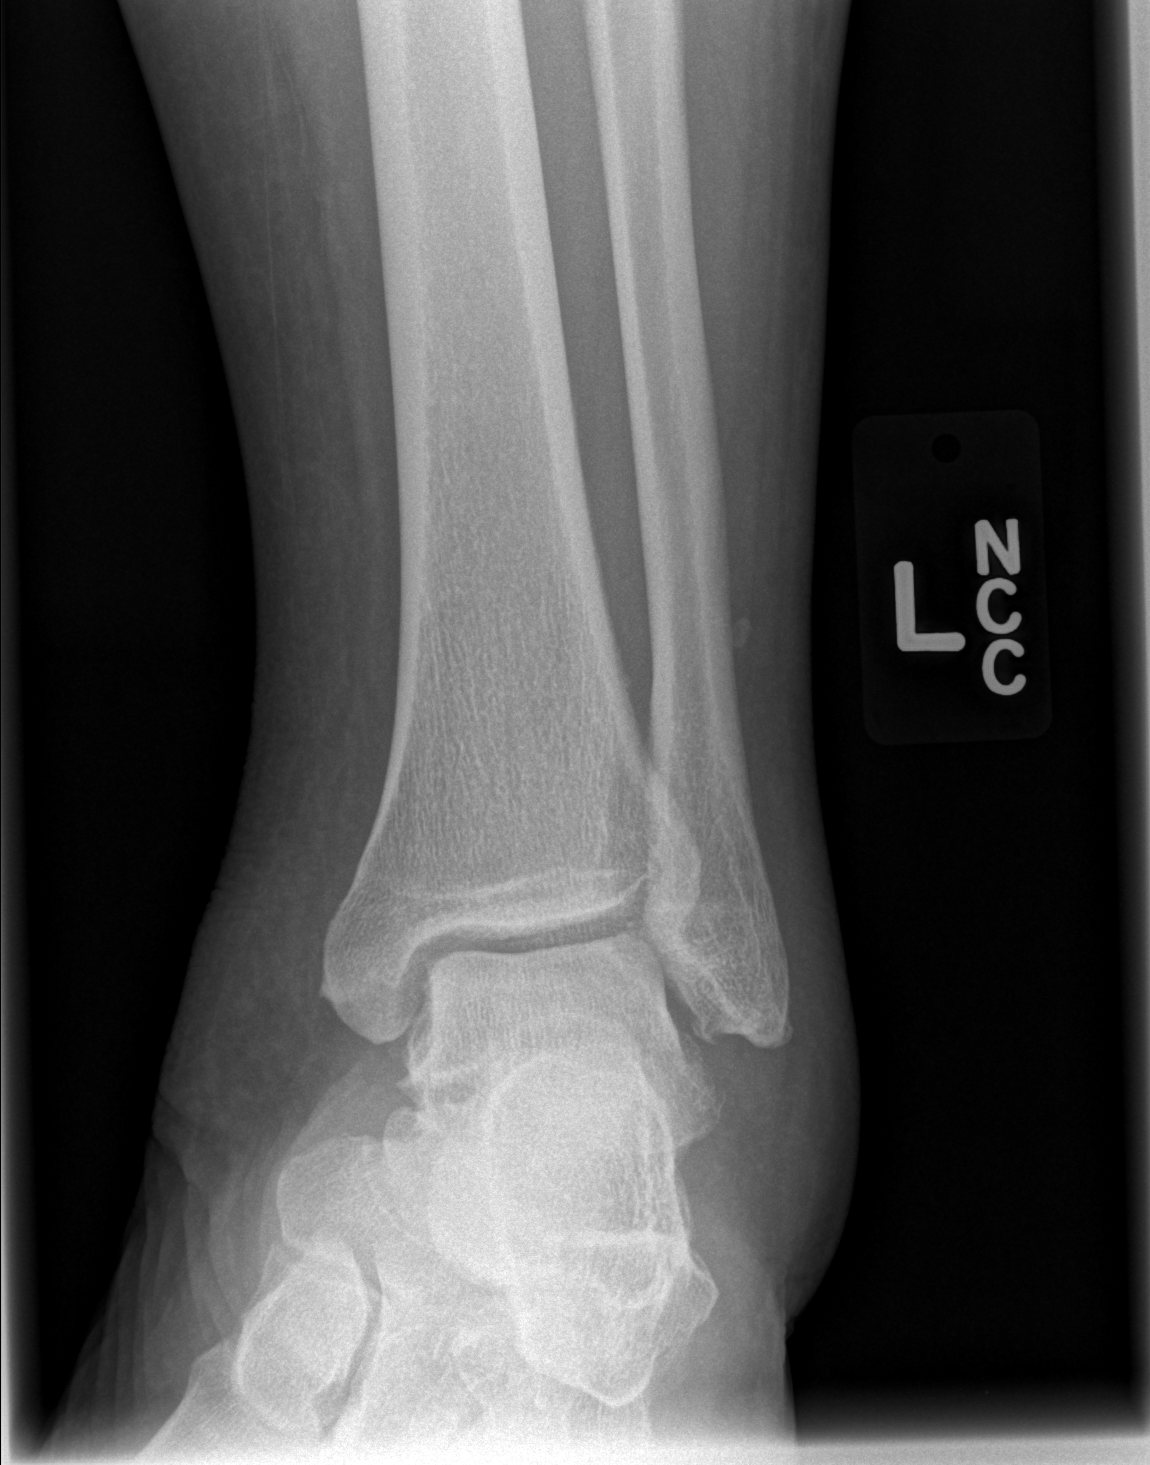

[t ankle joint oblique left]
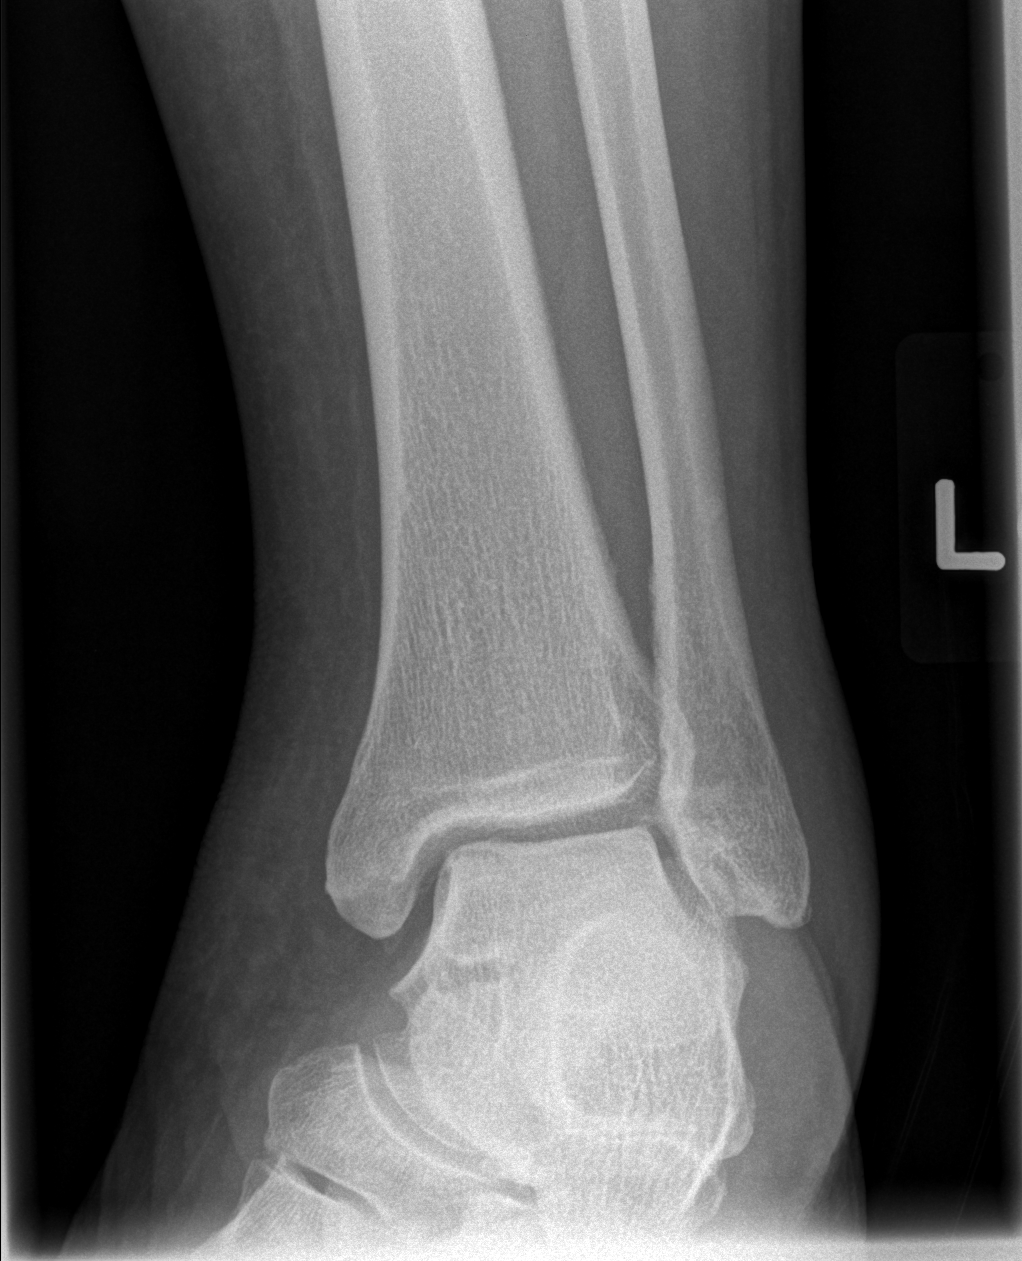

[t ankle joint lat left]
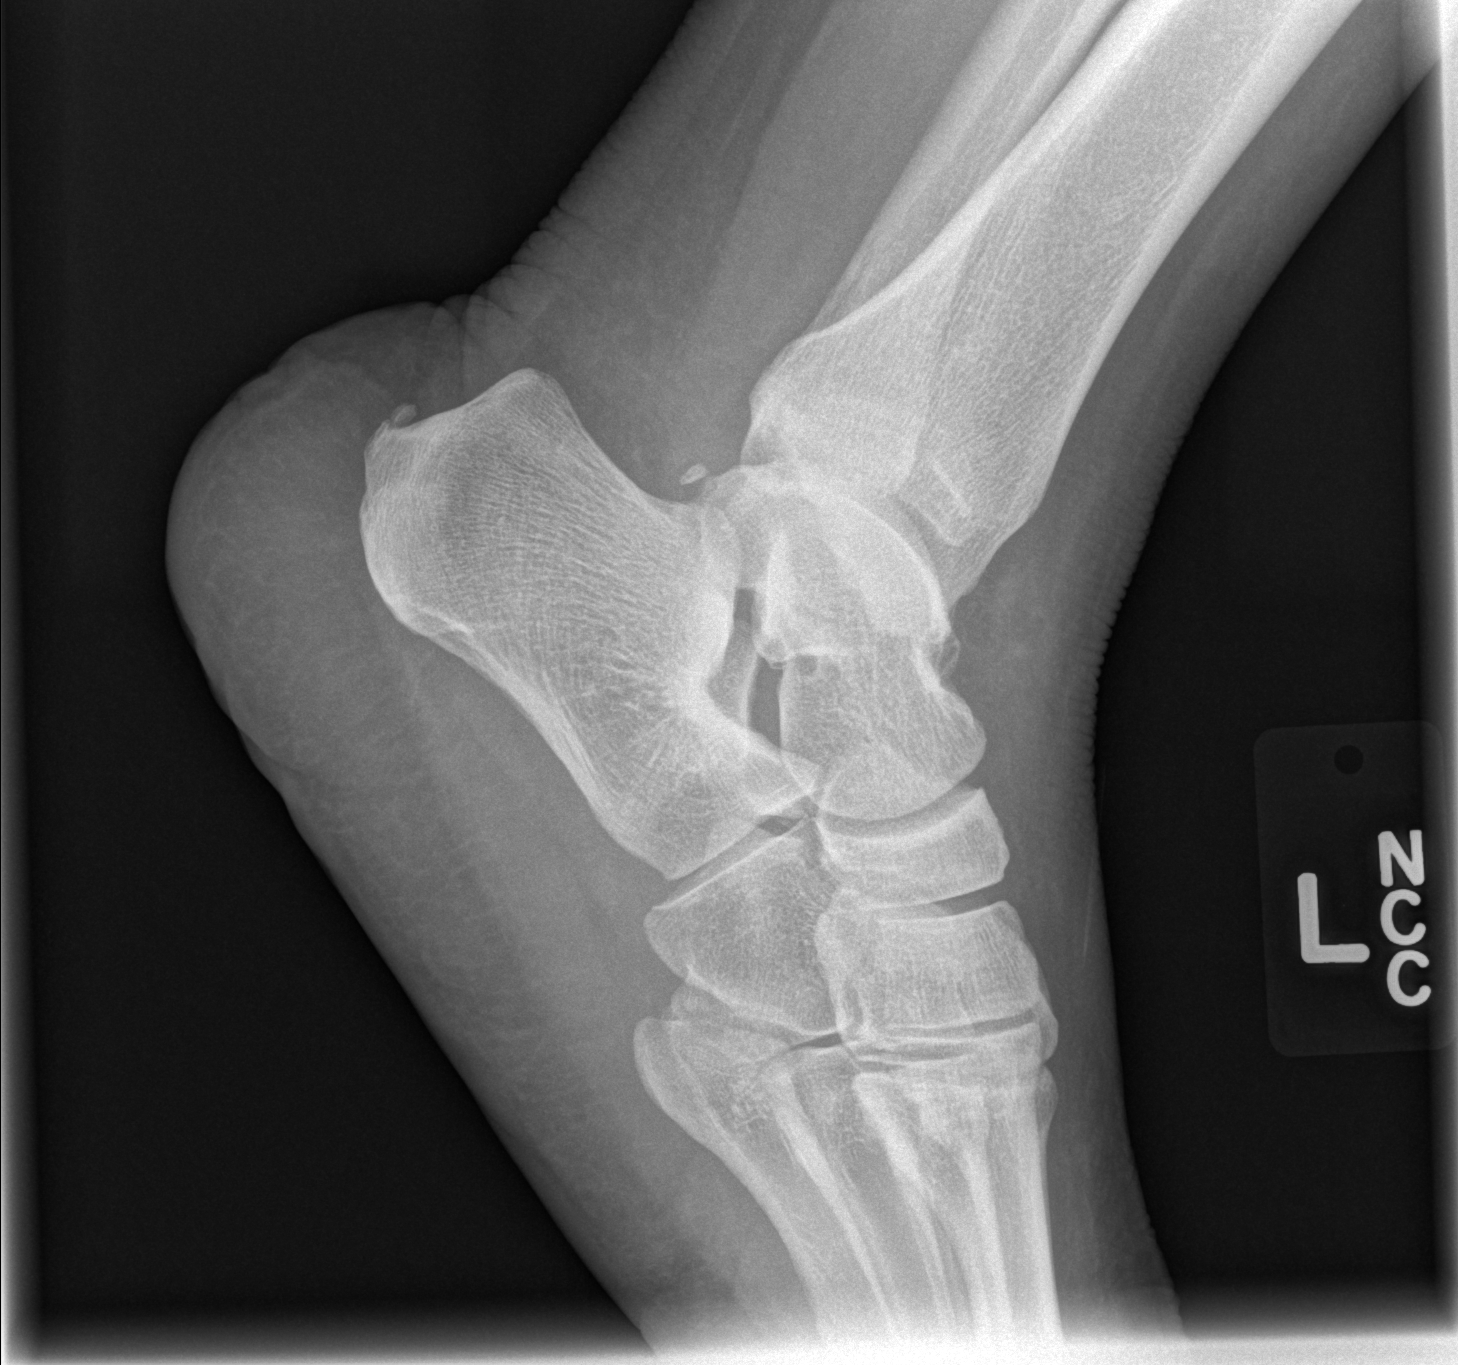

[3 of 3 positions shown; findings below may reference images not displayed]

FINDINGS: Mild periarticular soft tissue swelling. There is no acute fracture,
osseous erosion, or joint malalignment. No significant degenerative
joint narrowing. No evidence of joint effusion.
IMPRESSION: No acute osseous findings.

## 2015-02-05 ENCOUNTER — Other Ambulatory Visit: Payer: Self-pay | Admitting: Cardiology

## 2015-02-05 DIAGNOSIS — R079 Chest pain, unspecified: Secondary | ICD-10-CM

## 2015-02-11 ENCOUNTER — Emergency Department (HOSPITAL_COMMUNITY): Payer: Medicaid Other

## 2015-02-11 ENCOUNTER — Encounter (HOSPITAL_COMMUNITY): Payer: Self-pay | Admitting: Emergency Medicine

## 2015-02-11 ENCOUNTER — Emergency Department (HOSPITAL_COMMUNITY)
Admission: EM | Admit: 2015-02-11 | Discharge: 2015-02-12 | Disposition: A | Discharge status: Home / Self Care | Payer: Medicaid Other | Attending: Emergency Medicine | Admitting: Emergency Medicine

## 2015-02-11 DIAGNOSIS — R0789 Other chest pain: Secondary | ICD-10-CM | POA: Diagnosis not present

## 2015-02-11 DIAGNOSIS — I10 Essential (primary) hypertension: Secondary | ICD-10-CM | POA: Insufficient documentation

## 2015-02-11 DIAGNOSIS — E669 Obesity, unspecified: Secondary | ICD-10-CM | POA: Insufficient documentation

## 2015-02-11 DIAGNOSIS — R110 Nausea: Secondary | ICD-10-CM | POA: Diagnosis not present

## 2015-02-11 DIAGNOSIS — Z8673 Personal history of transient ischemic attack (TIA), and cerebral infarction without residual deficits: Secondary | ICD-10-CM | POA: Insufficient documentation

## 2015-02-11 DIAGNOSIS — R0602 Shortness of breath: Secondary | ICD-10-CM | POA: Insufficient documentation

## 2015-02-11 DIAGNOSIS — Z8639 Personal history of other endocrine, nutritional and metabolic disease: Secondary | ICD-10-CM | POA: Insufficient documentation

## 2015-02-11 DIAGNOSIS — R0781 Pleurodynia: Secondary | ICD-10-CM

## 2015-02-11 DIAGNOSIS — Z8719 Personal history of other diseases of the digestive system: Secondary | ICD-10-CM | POA: Insufficient documentation

## 2015-02-11 DIAGNOSIS — Z8669 Personal history of other diseases of the nervous system and sense organs: Secondary | ICD-10-CM | POA: Insufficient documentation

## 2015-02-11 DIAGNOSIS — Z79899 Other long term (current) drug therapy: Secondary | ICD-10-CM | POA: Insufficient documentation

## 2015-02-11 DIAGNOSIS — M1990 Unspecified osteoarthritis, unspecified site: Secondary | ICD-10-CM | POA: Diagnosis not present

## 2015-02-11 DIAGNOSIS — Z7982 Long term (current) use of aspirin: Secondary | ICD-10-CM | POA: Diagnosis not present

## 2015-02-11 DIAGNOSIS — R079 Chest pain, unspecified: Secondary | ICD-10-CM | POA: Diagnosis present

## 2015-02-11 LAB — CBC
HCT: 40.1 % (ref 36.0–46.0)
Hemoglobin: 13.3 g/dL (ref 12.0–15.0)
MCH: 28.9 pg (ref 26.0–34.0)
MCHC: 33.2 g/dL (ref 30.0–36.0)
MCV: 87 fL (ref 78.0–100.0)
Platelets: 315 10*3/uL (ref 150–400)
RBC: 4.61 MIL/uL (ref 3.87–5.11)
RDW: 15 % (ref 11.5–15.5)
WBC: 12.7 10*3/uL — ABNORMAL HIGH (ref 4.0–10.5)

## 2015-02-11 LAB — BASIC METABOLIC PANEL
Anion gap: 9 (ref 5–15)
BUN: 16 mg/dL (ref 6–20)
CO2: 27 mmol/L (ref 22–32)
Calcium: 9 mg/dL (ref 8.9–10.3)
Chloride: 101 mmol/L (ref 101–111)
Creatinine, Ser: 1.07 mg/dL — ABNORMAL HIGH (ref 0.44–1.00)
GFR calc Af Amer: >60 mL/min (ref >60)
GFR calc non Af Amer: 57 mL/min — ABNORMAL LOW (ref >60)
Glucose, Bld: 98 mg/dL (ref 65–99)
Potassium: 3.7 mmol/L (ref 3.5–5.1)
Sodium: 137 mmol/L (ref 135–145)

## 2015-02-11 LAB — I-STAT TROPONIN, ED
Troponin i, poc: 0 ng/mL (ref 0.00–0.08)
Troponin i, poc: 0.01 ng/mL (ref 0.00–0.08)

## 2015-02-11 LAB — BRAIN NATRIURETIC PEPTIDE: B Natriuretic Peptide: 18.4 pg/mL (ref 0.0–100.0)

## 2015-02-11 IMAGING — CR DG CHEST 1V PORT
1 series · 1 of 1 positions shown · non-contrast
Comparison: Chest x-ray 01/23/2014 P

CLINICAL DATA: Chest pressure.  Shortness of breath.

EXAM:
PORTABLE CHEST - 1 VIEW

[AP]
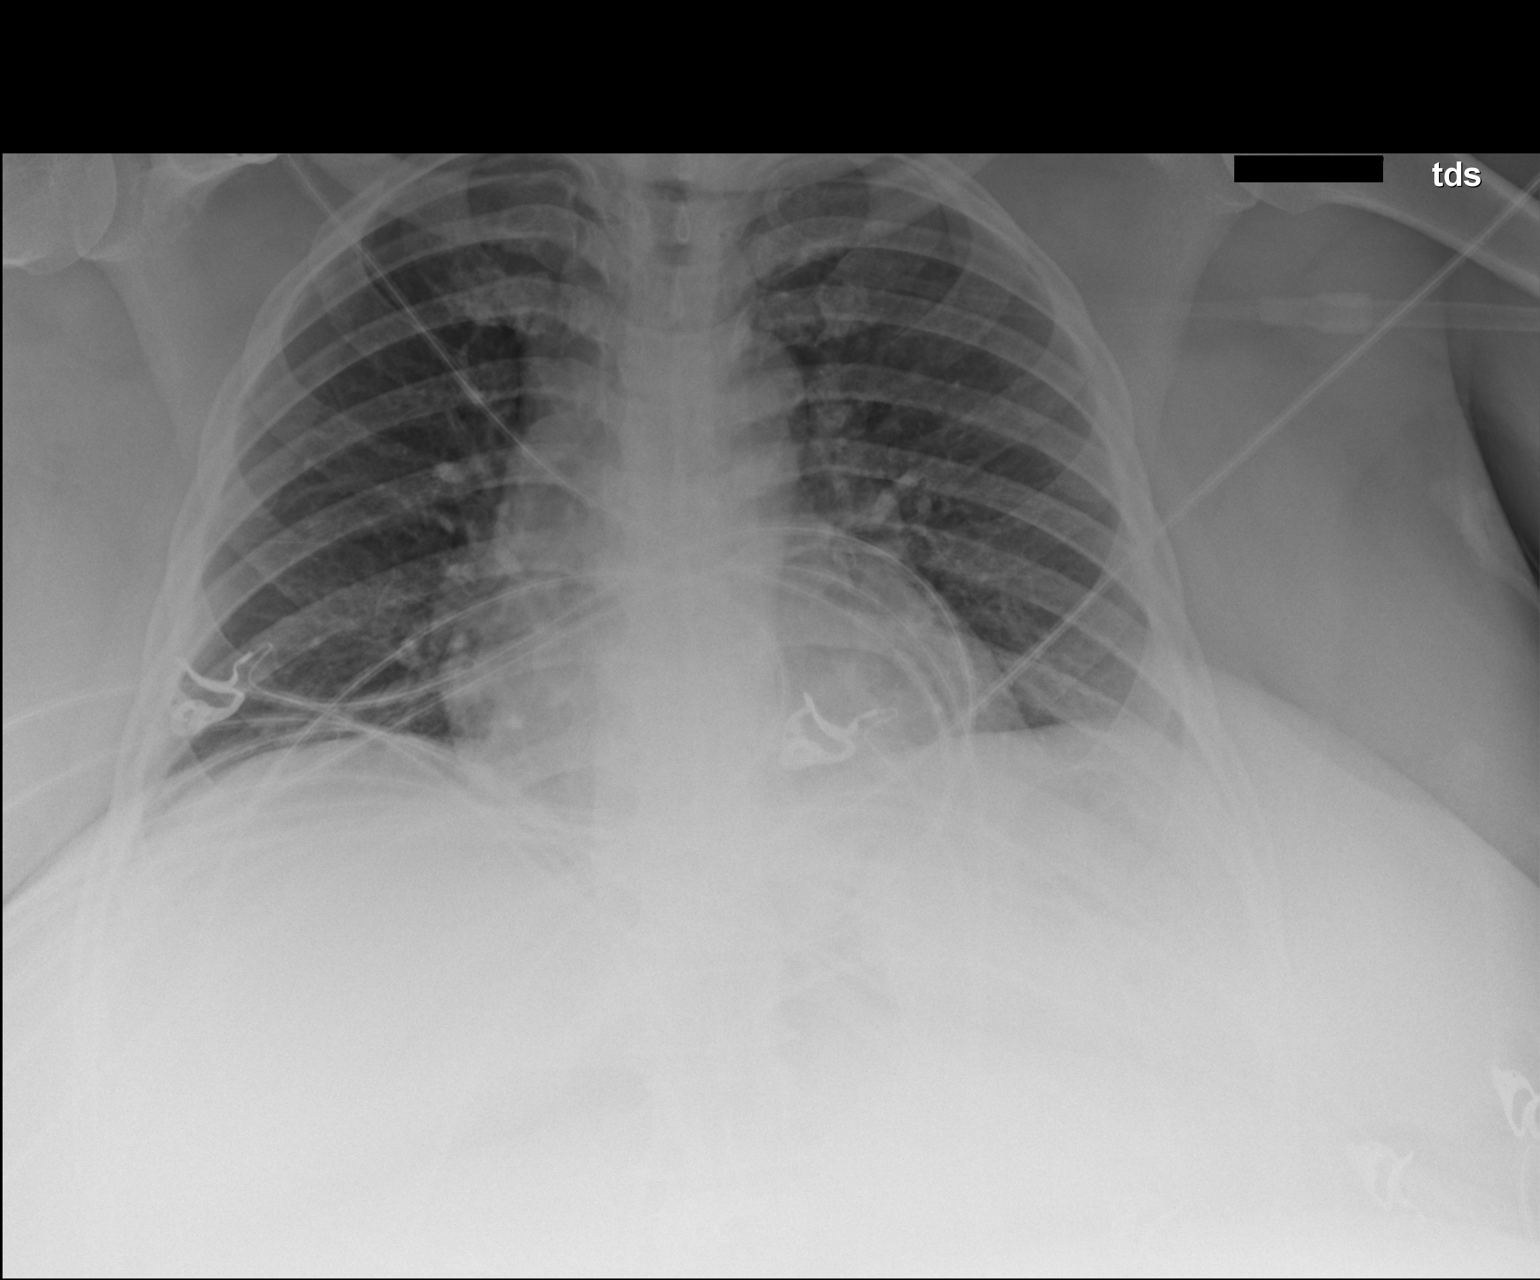

[1 of 1 positions shown; findings below may reference images not displayed]

FINDINGS: Mediastinum and hilar structures normal. Mild cardiomegaly with
normal pulmonary vascularity. No pulmonary infiltrate. No pleural
effusion or pneumothorax. No acute bony abnormality.
IMPRESSION: Mild cardiomegaly. No congestive heart failure. No acute pulmonary
disease.

## 2015-02-11 MED ORDER — MORPHINE SULFATE 4 MG/ML IJ SOLN
4.0000 mg | Freq: Once | INTRAMUSCULAR | Status: AC
  Administered 2015-02-11: 4 mg via INTRAVENOUS
  Filled 2015-02-11: qty 1

## 2015-02-11 MED ORDER — KETOROLAC TROMETHAMINE 30 MG/ML IJ SOLN
30.0000 mg | Freq: Once | INTRAMUSCULAR | Status: AC
  Administered 2015-02-11: 30 mg via INTRAVENOUS
  Filled 2015-02-11: qty 1

## 2015-02-11 MED ORDER — SODIUM CHLORIDE 0.9 % IV BOLUS (SEPSIS)
1000.0000 mL | Freq: Once | INTRAVENOUS | Status: AC
  Administered 2015-02-11: 1000 mL via INTRAVENOUS

## 2015-02-11 NOTE — ED Notes (Signed)
Patient with chest pain, shortness of breath and nausea that started three days ago.  Patient states that she took some nitro at home with no relief.  Patient continues with chest pain and shortness of breath in triage.

## 2015-02-11 NOTE — ED Notes (Signed)
MD at bedside. 

## 2015-02-11 NOTE — ED Provider Notes (Signed)
CSN: [id number]     Arrival date & time 02/11/15  2020 History   First MD Initiated Contact with Patient 02/11/15 2209     Chief Complaint  Patient presents with  . Chest Pain  . Shortness of Breath    (Consider location/radiation/quality/duration/timing/severity/associated sxs/prior Treatment) HPI Comments: Patient is a 54 year old female with a history of hypertension, hyperlipidemia, fibromyalgia and arthritis, CVA in 2000 and, and morbid obesity. She presents to the emergency department today for chest pain which has been present intermittently over the past 3 days. She reports pain in her midsternal region. This is nonradiating and worse with deep breathing. Patient states pain has been constant since 1800 this evening. She reports that she called her cardiologist, Dr. Terrence Dupont, who has scheduled her for a stress test tomorrow and Tuesday for further evaluation of her pain. Patient took a nitroglycerin tablet without relief of her symptoms. She has also taken her daily fibromyalgia medications including oxycodone with little improvement in her pain. Patient denies associated fever, syncope, vomiting, abdominal pain, and leg swelling.  2D echo 1 year ago with LVEF of 55% -  60% PCP - Dr. Deatra Ina   Patient is a 54 y.o. female presenting with chest pain and shortness of breath. The history is provided by the patient. No language interpreter was used.  Chest Pain Associated symptoms: nausea and shortness of breath   Shortness of Breath Associated symptoms: chest pain     Past Medical History  Diagnosis Date  . Hypertension   . Hypercholesteremia   . Lower back pain   . Nontoxic uninodular goiter     sees dr vollmer at [facility]  . Calculus of gallbladder without mention of cholecystitis or obstruction   . Arthritis   . Stroke 2000  . Migraine   . Sleep apnea     STOPBANG=5  . Fibromyalgia   . Obesity    Past Surgical History  Procedure Laterality Date  . Knee arthroscopy   one 1995 and 1 in 1997    both knees done  . Abdominal hysterectomy    . Cesarean section  yrs ago    done x 2  . Surgery for endometriosis  yrs ago  . Thryoid biopsy  December 01, 2011     at [facility]  . Cholecystectomy  01/05/2012    Procedure: LAPAROSCOPIC CHOLECYSTECTOMY WITH INTRAOPERATIVE CHOLANGIOGRAM;  Surgeon: Pedro Earls, MD;  Location: [facility];  Service: General;  Laterality: N/A;  . Colonoscopy  10/08/2012    Procedure: COLONOSCOPY;  Surgeon: Beryle Beams, MD;  Location: [facility];  Service: Endoscopy;  Laterality: N/A;   Family History  Problem Relation Age of Onset  . Cancer Brother     colon and lung  . Cancer Maternal Grandmother     colon   History  Substance Use Topics  . Smoking status: Never Smoker   . Smokeless tobacco: Never Used  . Alcohol Use: No   OB History    No data available      Review of Systems  Respiratory: Positive for shortness of breath.   Cardiovascular: Positive for chest pain.  Gastrointestinal: Positive for nausea.  All other systems reviewed and are negative.   Allergies  Shrimp and Naproxen  Home Medications   Prior to Admission medications   Medication Sig Start Date End Date Taking? Authorizing Provider  amLODipine (NORVASC) 10 MG tablet Take 10 mg by mouth daily. 11/29/14  Yes Historical Provider, MD  aspirin EC 81 MG EC  tablet Take 1 tablet (81 mg total) by mouth daily. 03/13/14  Yes Charolette Forward, MD  cholecalciferol (VITAMIN D) 1000 UNITS tablet Take 1,000 Units by mouth daily.   Yes Historical Provider, MD  DULoxetine (CYMBALTA) 60 MG capsule Take 60 mg by mouth daily.    Yes Historical Provider, MD  fluticasone (FLONASE) 50 MCG/ACT nasal spray Place 2 sprays into both nostrils 2 (two) times daily.    Yes Historical Provider, MD  furosemide (LASIX) 20 MG tablet Take 20 mg by mouth daily. 02/01/15  Yes Historical Provider, MD  lisinopril-hydrochlorothiazide (PRINZIDE,ZESTORETIC) 20-25 MG per tablet Take 1 tablet by mouth daily.  11/29/14  Yes Historical Provider, MD  loratadine (CLARITIN) 10 MG tablet Take 10 mg by mouth daily.    Yes Historical Provider, MD  metoprolol succinate (TOPROL-XL) 50 MG 24 hr tablet Take 50 mg by mouth at bedtime. Take with or immediately following a meal.   Yes Historical Provider, MD  nitroGLYCERIN (NITROSTAT) 0.4 MG SL tablet Place 1 tablet (0.4 mg total) under the tongue every 5 (five) minutes x 3 doses as needed for chest pain. Patient taking differently: Place 0.4 mg under the tongue every 5 (five) minutes as needed for chest pain. Maximum 3 doses 03/13/14  Yes Charolette Forward, MD  Oxycodone HCl 10 MG TABS Take 10 mg by mouth 3 (three) times daily as needed (pain).    Yes Historical Provider, MD  pantoprazole (PROTONIX) 40 MG tablet Take 40 mg by mouth 2 (two) times daily.    Yes Historical Provider, MD  pregabalin (LYRICA) 75 MG capsule Take 75-150 mg by mouth 2 (two) times daily. Take 1 tablet (75 mg) every morning and 2 tablets (150 mg) at bedtime   Yes Historical Provider, MD  tiZANidine (ZANAFLEX) 4 MG capsule Take 4 mg by mouth every 8 (eight) hours as needed for muscle spasms.  01/29/15  Yes Historical Provider, MD  topiramate (TOPAMAX) 100 MG tablet Take 100 mg by mouth 2 (two) times daily. 01/28/15  Yes Historical Provider, MD  HYDROcodone-acetaminophen (NORCO/VICODIN) 5-325 MG per tablet Take 1 tablet by mouth every 6 (six) hours as needed for moderate pain. Patient not taking: Reported on 02/11/2015 10/09/14   Dalia Heading, PA-C  lisinopril (PRINIVIL) 10 MG tablet Take 1 tablet (10 mg total) by mouth daily. Patient not taking: Reported on 02/11/2015 08/01/14   Verlee Monte, MD  metoprolol succinate (TOPROL-XL) 100 MG 24 hr tablet Take 1 tablet (100 mg total) by mouth daily. Take with or immediately following a meal. Patient not taking: Reported on 02/11/2015 03/13/14   Charolette Forward, MD  predniSONE (DELTASONE) 50 MG tablet Take 1 tablet (50 mg total) by mouth daily. Patient not taking:  Reported on 02/11/2015 10/09/14   Dalia Heading, PA-C   BP 152/74 mmHg  Pulse 69  Temp(Src) 98.5 F (36.9 C) (Oral)  Resp 24  Ht 4\' 11"  (1.499 m)  Wt 278 lb (126.1 kg)  BMI 56.12 kg/m2  SpO2 99%   Physical Exam  Constitutional: She is oriented to person, place, and time. She appears well-developed and well-nourished. No distress.  Nontoxic/nonseptic appearing morbidly obese female  HENT:  Head: Normocephalic and atraumatic.  Eyes: Conjunctivae and EOM are normal. No scleral icterus.  Neck: Normal range of motion.  Cardiovascular: Normal rate, regular rhythm and intact distal pulses.   Pulmonary/Chest: Effort normal and breath sounds normal. No respiratory distress. She has no wheezes. She has no rales. She exhibits tenderness.  Lungs clear bilaterally. Chest expansion  symmetric. Pain reproducible on palpation to the sternal region. No crepitus or deformity.  Abdominal: Soft. There is no tenderness.  Soft morbidly obese abdomen without tenderness or peritoneal signs.  Musculoskeletal: Normal range of motion.  Neurological: She is alert and oriented to person, place, and time. She exhibits normal muscle tone. Coordination normal.  Skin: Skin is warm and dry. No rash noted. She is not diaphoretic. No erythema. No pallor.  Psychiatric: She has a normal mood and affect. Her behavior is normal.  Nursing note and vitals reviewed.   ED Course  Procedures (including critical care time) Labs Review Labs Reviewed  CBC - Abnormal; Notable for the following:    WBC 12.7 (*)    All other components within normal limits  BASIC METABOLIC PANEL - Abnormal; Notable for the following:    Creatinine, Ser 1.07 (*)    GFR calc non Af Amer 57 (*)    All other components within normal limits  BRAIN NATRIURETIC PEPTIDE  D-DIMER, QUANTITATIVE  I-STAT TROPOININ, ED  Randolm Idol, ED    Imaging Review Dg Chest 2 View  02/11/2015   CLINICAL DATA:  Chest pain.  Shortness of breath.  EXAM:  CHEST  2 VIEW  COMPARISON:  10/09/2014  FINDINGS: The heart size and mediastinal contours are within normal limits. Both lungs are clear. The visualized skeletal structures are unremarkable.  IMPRESSION: No active cardiopulmonary disease.   Electronically Signed   By: Van Clines M.D.   On: 02/11/2015 21:11     EKG Interpretation   Date/Time:  Sunday Feb 11 2015 20:31:37 EDT Ventricular Rate:  90 PR Interval:  142 QRS Duration: 76 QT Interval:  378 QTC Calculation: 462 R Axis:   1 Text Interpretation:  Normal sinus rhythm Normal ECG Confirmed by  Alvino Chapel  MD, Ovid Curd [phone]) on 02/12/2015 12:21:43 AM      MDM   Final diagnoses:  Pleuritic chest pain    54 year old female presents to the emergency department for further evaluation of chest pain. Chest pain has been intermittent over the past 3 days. Patient does have a history of fibromyalgia. Pain is reproducible on palpation of the chest wall. No crepitus or deformity. Doubt ACS given atypical nature of symptoms, nonischemic EKG, and negative troponin x 2. Patient does have a heart score 3-4 (depending upon suspicion of pain) c/w low to moderate risk of ACS; however she has an appointment tomorrow with Dr. Terrence Dupont for a stress test. Also doubt PE; D dimer negative. She has had significant improvement in her symptoms with morphine and Toradol. No relief from NTG taken PTA.  Given low suspicion for underlying cardiac etiology in conjunction with close follow-up later this morning with her cardiologist, I do not believe the patient requires inpatient monitoring for further evaluation and management. Patient states that she is feeling much better. She is comfortable with discharge. Have stressed to the patient her need to keep her follow-up appointment for a stress test in the morning. Patient verbalizes comfort and understanding with plan. Patient discharged in good condition. Return precautions given. Case discussed with my attending,  Dr. Alvino Chapel who is in agreement with this patient's workup, assessment, management plan, and stability for discharge.   Filed Vitals:   02/11/15 2031 02/11/15 2245 02/11/15 2315  BP: 135/81 139/84 152/74  Pulse: 90 70 69  Temp: 98.5 F (36.9 C)    TempSrc: Oral    Resp: 22 30 24   Height: 4\' 11"  (1.499 m)    Weight: 278  lb (126.1 kg)    SpO2: 100% 98% 99%      Antonietta Breach, PA-C 02/12/15 4462  Davonna Belling, MD 02/14/15 [phone]

## 2015-02-12 ENCOUNTER — Encounter (HOSPITAL_COMMUNITY)
Admission: RE | Admit: 2015-02-12 | Discharge: 2015-02-12 | Disposition: A | Discharge status: Home / Self Care | Payer: Medicaid Other | Source: Ambulatory Visit | Attending: Cardiology | Admitting: Cardiology

## 2015-02-12 DIAGNOSIS — R079 Chest pain, unspecified: Secondary | ICD-10-CM | POA: Diagnosis present

## 2015-02-12 LAB — D-DIMER, QUANTITATIVE (NOT AT ARMC): D-Dimer, Quant: 0.38 ug/mL-FEU (ref 0.00–0.48)

## 2015-02-12 MED ORDER — REGADENOSON 0.4 MG/5ML IV SOLN
0.4000 mg | Freq: Once | INTRAVENOUS | Status: AC
  Administered 2015-02-12: 0.4 mg via INTRAVENOUS

## 2015-02-12 MED ORDER — REGADENOSON 0.4 MG/5ML IV SOLN
INTRAVENOUS | Status: AC
  Administered 2015-02-12: 0.4 mg via INTRAVENOUS
  Filled 2015-02-12: qty 5

## 2015-02-12 NOTE — Discharge Instructions (Signed)

## 2015-02-13 ENCOUNTER — Encounter (HOSPITAL_COMMUNITY)
Admission: RE | Admit: 2015-02-13 | Discharge: 2015-02-13 | Disposition: A | Discharge status: Home / Self Care | Payer: Medicaid Other | Source: Ambulatory Visit | Attending: Cardiology | Admitting: Cardiology

## 2015-02-13 DIAGNOSIS — R079 Chest pain, unspecified: Secondary | ICD-10-CM | POA: Diagnosis not present

## 2015-02-13 MED ORDER — TECHNETIUM TC 99M SESTAMIBI GENERIC - CARDIOLITE
30.0000 | Freq: Once | INTRAVENOUS | Status: AC | PRN
  Administered 2015-02-13: 30 via INTRAVENOUS

## 2015-02-13 MED ORDER — TECHNETIUM TC 99M SESTAMIBI GENERIC - CARDIOLITE
30.0000 | Freq: Once | INTRAVENOUS | Status: AC | PRN
  Administered 2015-02-12: 30 via INTRAVENOUS

## 2015-03-14 ENCOUNTER — Emergency Department (HOSPITAL_COMMUNITY)
Admission: EM | Admit: 2015-03-14 | Discharge: 2015-03-14 | Disposition: A | Discharge status: Home / Self Care | Payer: Medicaid Other | Source: Home / Self Care | Attending: Family Medicine | Admitting: Family Medicine

## 2015-03-14 ENCOUNTER — Encounter (HOSPITAL_COMMUNITY): Payer: Self-pay | Admitting: Emergency Medicine

## 2015-03-14 DIAGNOSIS — L299 Pruritus, unspecified: Secondary | ICD-10-CM | POA: Diagnosis not present

## 2015-03-14 LAB — POCT I-STAT, CHEM 8
BUN: <3 mg/dL — ABNORMAL LOW (ref 6–20)
Calcium, Ion: 1.2 mmol/L (ref 1.12–1.23)
Chloride: 95 mmol/L — ABNORMAL LOW (ref 101–111)
Creatinine, Ser: 0.8 mg/dL (ref 0.44–1.00)
Glucose, Bld: 116 mg/dL — ABNORMAL HIGH (ref 65–99)
HCT: 44 % (ref 36.0–46.0)
Hemoglobin: 15 g/dL (ref 12.0–15.0)
Potassium: 3 mmol/L — ABNORMAL LOW (ref 3.5–5.1)
Sodium: 141 mmol/L (ref 135–145)
TCO2: 29 mmol/L (ref 0–100)

## 2015-03-14 MED ORDER — TRIAMCINOLONE ACETONIDE 40 MG/ML IJ SUSP
40.0000 mg | Freq: Once | INTRAMUSCULAR | Status: AC
  Administered 2015-03-14: 40 mg via INTRAMUSCULAR

## 2015-03-14 MED ORDER — HYDROXYZINE HCL 25 MG PO TABS: 25.0000 mg | ORAL_TABLET | Freq: Four times a day (QID) | ORAL | Status: DC

## 2015-03-14 MED ORDER — METHYLPREDNISOLONE 4 MG PO TBPK: ORAL_TABLET | ORAL | Status: DC

## 2015-03-14 MED ORDER — TRIAMCINOLONE ACETONIDE 40 MG/ML IJ SUSP
INTRAMUSCULAR | Status: AC
  Filled 2015-03-14: qty 1

## 2015-03-14 MED ORDER — METHYLPREDNISOLONE ACETATE 40 MG/ML IJ SUSP
80.0000 mg | Freq: Once | INTRAMUSCULAR | Status: AC
  Administered 2015-03-14: 80 mg via INTRAMUSCULAR

## 2015-03-14 MED ORDER — METHYLPREDNISOLONE ACETATE 80 MG/ML IJ SUSP
INTRAMUSCULAR | Status: AC
  Filled 2015-03-14: qty 1

## 2015-03-14 NOTE — ED Notes (Signed)
Reports itching al over for 3 days.

## 2015-03-14 NOTE — ED Provider Notes (Signed)
CSN: [id number]     Arrival date & time 03/14/15  1300 History   First MD Initiated Contact with Patient 03/14/15 1327     Chief Complaint  Patient presents with  . Pruritis   (Consider location/radiation/quality/duration/timing/severity/associated sxs/prior Treatment) Patient is a 54 y.o. female presenting with rash. The history is provided by the patient.  Rash Location:  Full body Quality: itchiness   Quality: not painful, not red and not scaling   Severity:  Mild Onset quality:  Gradual Duration:  3 days Progression:  Unchanged Chronicity:  New Relieved by:  None tried Worsened by:  Nothing tried Ineffective treatments:  None tried Associated symptoms: no fever, no nausea, no shortness of breath and not vomiting     Past Medical History  Diagnosis Date  . Hypertension   . Hypercholesteremia   . Lower back pain   . Nontoxic uninodular goiter     sees dr vollmer at [facility]  . Calculus of gallbladder without mention of cholecystitis or obstruction   . Arthritis   . Stroke 2000  . Migraine   . Sleep apnea     STOPBANG=5  . Fibromyalgia   . Obesity    Past Surgical History  Procedure Laterality Date  . Knee arthroscopy  one 1995 and 1 in 1997    both knees done  . Abdominal hysterectomy    . Cesarean section  yrs ago    done x 2  . Surgery for endometriosis  yrs ago  . Thryoid biopsy  December 01, 2011     [facility]  . Cholecystectomy  01/05/2012    Procedure: LAPAROSCOPIC CHOLECYSTECTOMY WITH INTRAOPERATIVE CHOLANGIOGRAM;  Surgeon: Pedro Earls, MD;  Location: [facility];  Service: General;  Laterality: N/A;  . Colonoscopy  10/08/2012    Procedure: COLONOSCOPY;  Surgeon: Beryle Beams, MD;  Location: [facility];  Service: Endoscopy;  Laterality: N/A;   Family History  Problem Relation Age of Onset  . Cancer Brother     colon and lung  . Cancer Maternal Grandmother     colon   History  Substance Use Topics  . Smoking status: Never Smoker   . Smokeless  tobacco: Never Used  . Alcohol Use: No   OB History    No data available     Review of Systems  Constitutional: Negative.  Negative for fever.  HENT: Negative.   Respiratory: Negative.  Negative for shortness of breath.   Cardiovascular: Negative.   Gastrointestinal: Negative for nausea and vomiting.  Skin: Positive for rash.    Allergies  Shrimp and Naproxen  Home Medications   Prior to Admission medications   Medication Sig Start Date End Date Taking? Authorizing Provider  amLODipine (NORVASC) 10 MG tablet Take 10 mg by mouth daily. 11/29/14   Historical Provider, MD  aspirin EC 81 MG EC tablet Take 1 tablet (81 mg total) by mouth daily. 03/13/14   Charolette Forward, MD  cholecalciferol (VITAMIN D) 1000 UNITS tablet Take 1,000 Units by mouth daily.    Historical Provider, MD  DULoxetine (CYMBALTA) 60 MG capsule Take 60 mg by mouth daily.     Historical Provider, MD  fluticasone (FLONASE) 50 MCG/ACT nasal spray Place 2 sprays into both nostrils 2 (two) times daily.     Historical Provider, MD  furosemide (LASIX) 20 MG tablet Take 20 mg by mouth daily. 02/01/15   Historical Provider, MD  HYDROcodone-acetaminophen (NORCO/VICODIN) 5-325 MG per tablet Take 1 tablet by mouth every 6 (six)  hours as needed for moderate pain. Patient not taking: Reported on 02/11/2015 10/09/14   Dalia Heading, PA-C  hydrOXYzine (ATARAX/VISTARIL) 25 MG tablet Take 1 tablet (25 mg total) by mouth every 6 (six) hours. For itching 03/14/15   Billy Fischer, MD  lisinopril (PRINIVIL) 10 MG tablet Take 1 tablet (10 mg total) by mouth daily. Patient not taking: Reported on 02/11/2015 08/01/14   Verlee Monte, MD  lisinopril-hydrochlorothiazide (PRINZIDE,ZESTORETIC) 20-25 MG per tablet Take 1 tablet by mouth daily. 11/29/14   Historical Provider, MD  loratadine (CLARITIN) 10 MG tablet Take 10 mg by mouth daily.     Historical Provider, MD  methylPREDNISolone (MEDROL DOSEPAK) 4 MG TBPK tablet follow package directions 03/14/15    Billy Fischer, MD  metoprolol succinate (TOPROL-XL) 100 MG 24 hr tablet Take 1 tablet (100 mg total) by mouth daily. Take with or immediately following a meal. Patient not taking: Reported on 02/11/2015 03/13/14   Charolette Forward, MD  metoprolol succinate (TOPROL-XL) 50 MG 24 hr tablet Take 50 mg by mouth at bedtime. Take with or immediately following a meal.    Historical Provider, MD  nitroGLYCERIN (NITROSTAT) 0.4 MG SL tablet Place 1 tablet (0.4 mg total) under the tongue every 5 (five) minutes x 3 doses as needed for chest pain. Patient taking differently: Place 0.4 mg under the tongue every 5 (five) minutes as needed for chest pain. Maximum 3 doses 03/13/14   Charolette Forward, MD  Oxycodone HCl 10 MG TABS Take 10 mg by mouth 3 (three) times daily as needed (pain).     Historical Provider, MD  pantoprazole (PROTONIX) 40 MG tablet Take 40 mg by mouth 2 (two) times daily.     Historical Provider, MD  predniSONE (DELTASONE) 50 MG tablet Take 1 tablet (50 mg total) by mouth daily. Patient not taking: Reported on 02/11/2015 10/09/14   Dalia Heading, PA-C  pregabalin (LYRICA) 75 MG capsule Take 75-150 mg by mouth 2 (two) times daily. Take 1 tablet (75 mg) every morning and 2 tablets (150 mg) at bedtime    Historical Provider, MD  tiZANidine (ZANAFLEX) 4 MG capsule Take 4 mg by mouth every 8 (eight) hours as needed for muscle spasms.  01/29/15   Historical Provider, MD  topiramate (TOPAMAX) 100 MG tablet Take 100 mg by mouth 2 (two) times daily. 01/28/15   Historical Provider, MD   BP 146/91 mmHg  Pulse 97  Temp(Src) 98.6 F (37 C) (Oral)  Resp 16  SpO2 97% Physical Exam  Constitutional: She is oriented to person, place, and time. She appears well-developed and well-nourished. No distress.  HENT:  Mouth/Throat: Oropharynx is clear and moist.  Neck: Normal range of motion. Neck supple.  Cardiovascular: Normal rate, regular rhythm, normal heart sounds and intact distal pulses.   Pulmonary/Chest: Effort  normal and breath sounds normal.  Lymphadenopathy:    She has no cervical adenopathy.  Neurological: She is alert and oriented to person, place, and time.  Skin: Skin is warm and dry. No rash noted.  Nursing note and vitals reviewed.   ED Course  Procedures (including critical care time) Labs Review Labs Reviewed  POCT I-STAT, CHEM 8 - Abnormal; Notable for the following:    Potassium 3.0 (*)    Chloride 95 (*)    BUN <3 (*)    Glucose, Bld 116 (*)    All other components within normal limits   i-stat wnl. Imaging Review No results found.   MDM   1. Pruritus  of skin       Billy Fischer, MD 03/14/15 [id number]

## 2015-03-14 NOTE — Discharge Instructions (Signed)
Take all of medicine as needed, see your doctor if further problems.

## 2015-04-02 ENCOUNTER — Encounter (HOSPITAL_COMMUNITY): Payer: Self-pay | Admitting: Emergency Medicine

## 2015-04-02 ENCOUNTER — Emergency Department (HOSPITAL_COMMUNITY)
Admission: EM | Admit: 2015-04-02 | Discharge: 2015-04-02 | Disposition: A | Discharge status: Home / Self Care | Payer: Medicaid Other | Attending: Emergency Medicine | Admitting: Emergency Medicine

## 2015-04-02 DIAGNOSIS — Z7951 Long term (current) use of inhaled steroids: Secondary | ICD-10-CM | POA: Insufficient documentation

## 2015-04-02 DIAGNOSIS — M1990 Unspecified osteoarthritis, unspecified site: Secondary | ICD-10-CM | POA: Insufficient documentation

## 2015-04-02 DIAGNOSIS — M797 Fibromyalgia: Secondary | ICD-10-CM | POA: Insufficient documentation

## 2015-04-02 DIAGNOSIS — Z79899 Other long term (current) drug therapy: Secondary | ICD-10-CM | POA: Diagnosis not present

## 2015-04-02 DIAGNOSIS — Z8673 Personal history of transient ischemic attack (TIA), and cerebral infarction without residual deficits: Secondary | ICD-10-CM | POA: Insufficient documentation

## 2015-04-02 DIAGNOSIS — Z8719 Personal history of other diseases of the digestive system: Secondary | ICD-10-CM | POA: Diagnosis not present

## 2015-04-02 DIAGNOSIS — I10 Essential (primary) hypertension: Secondary | ICD-10-CM | POA: Insufficient documentation

## 2015-04-02 DIAGNOSIS — M5441 Lumbago with sciatica, right side: Secondary | ICD-10-CM | POA: Diagnosis not present

## 2015-04-02 DIAGNOSIS — M545 Low back pain: Secondary | ICD-10-CM | POA: Diagnosis present

## 2015-04-02 DIAGNOSIS — Z7982 Long term (current) use of aspirin: Secondary | ICD-10-CM | POA: Insufficient documentation

## 2015-04-02 DIAGNOSIS — G43909 Migraine, unspecified, not intractable, without status migrainosus: Secondary | ICD-10-CM | POA: Diagnosis not present

## 2015-04-02 DIAGNOSIS — E669 Obesity, unspecified: Secondary | ICD-10-CM | POA: Insufficient documentation

## 2015-04-02 MED ORDER — PREDNISONE 20 MG PO TABS: 40.0000 mg | ORAL_TABLET | Freq: Every day | ORAL | Status: DC

## 2015-04-02 MED ORDER — CYCLOBENZAPRINE HCL 10 MG PO TABS: 10.0000 mg | ORAL_TABLET | Freq: Two times a day (BID) | ORAL | Status: DC | PRN

## 2015-04-02 MED ORDER — KETOROLAC TROMETHAMINE 60 MG/2ML IM SOLN
60.0000 mg | Freq: Once | INTRAMUSCULAR | Status: AC
  Administered 2015-04-02: 60 mg via INTRAMUSCULAR
  Filled 2015-04-02: qty 2

## 2015-04-02 MED ORDER — DEXAMETHASONE SODIUM PHOSPHATE 10 MG/ML IJ SOLN
6.0000 mg | Freq: Once | INTRAMUSCULAR | Status: AC
  Administered 2015-04-02: 6 mg via INTRAMUSCULAR
  Filled 2015-04-02: qty 1

## 2015-04-02 NOTE — ED Notes (Signed)
Pt verbalizes understanding of d/c instructions and denies any further needs at this time. 

## 2015-04-02 NOTE — ED Provider Notes (Signed)
CSN: [id number]     Arrival date & time 04/02/15  2232 History   This chart was scribed for non-physician practitioner, Delsa Grana PA-C, working with Pattricia Boss, MD by Altamease Oiler, ED Scribe. This patient was seen in room TR06C/TR06C and the patient's care was started at 11:25 PM.  Chief Complaint  Patient presents with  . Back Pain   The history is provided by the patient. No language interpreter was used.   Jodi Bowman is a 54 y.o. female who presents to the Emergency Department complaining of exacerbation of chronic lower back pain (R>L) with onset 1 week ago. Her pain is usually a 9/10 in severity but is 10+/10 in severity today.  The pain radiates down the right leg. She has taken her home medicines for pain, including Cymbalta, Lyrica, 10 mg oxycodone TID (last does around 9 PM) which have provided insufficient pain relief PTA. The pt has been seen by her neurologist and has injections scheduled for next week. Associated symptoms include numbness near the right buttock, which has been chronic.  She denies any fever, chills, sweats, new numbness, tingling, weakness.  She has not had any bladder or bowel incontinence.  She denies any trauma  Past Medical History  Diagnosis Date  . Hypertension   . Hypercholesteremia   . Lower back pain   . Nontoxic uninodular goiter     sees dr vollmer at [facility]  . Calculus of gallbladder without mention of cholecystitis or obstruction   . Arthritis   . Stroke 2000  . Migraine   . Sleep apnea     STOPBANG=5  . Fibromyalgia   . Obesity    Past Surgical History  Procedure Laterality Date  . Knee arthroscopy  one 1995 and 1 in 1997    both knees done  . Abdominal hysterectomy    . Cesarean section  yrs ago    done x 2  . Surgery for endometriosis  yrs ago  . Thryoid biopsy  December 01, 2011     at [facility]  . Cholecystectomy  01/05/2012    Procedure: LAPAROSCOPIC CHOLECYSTECTOMY WITH INTRAOPERATIVE CHOLANGIOGRAM;  Surgeon: Pedro Earls, MD;  Location: [facility];  Service: General;  Laterality: N/A;  . Colonoscopy  10/08/2012    Procedure: COLONOSCOPY;  Surgeon: Beryle Beams, MD;  Location: [facility];  Service: Endoscopy;  Laterality: N/A;   Family History  Problem Relation Age of Onset  . Cancer Brother     colon and lung  . Cancer Maternal Grandmother     colon   History  Substance Use Topics  . Smoking status: Never Smoker   . Smokeless tobacco: Never Used  . Alcohol Use: No   OB History    No data available     Review of Systems  Constitutional: Negative.   HENT: Negative.   Respiratory: Negative.   Gastrointestinal: Negative for nausea, vomiting, abdominal pain, diarrhea and constipation.  Genitourinary: Negative.   Musculoskeletal: Negative for neck pain and neck stiffness.  Skin: Negative.   Neurological: Negative.       Allergies  Shrimp and Naproxen  Home Medications   Prior to Admission medications   Medication Sig Start Date End Date Taking? Authorizing Provider  amLODipine (NORVASC) 10 MG tablet Take 10 mg by mouth daily. 11/29/14   Historical Provider, MD  aspirin EC 81 MG EC tablet Take 1 tablet (81 mg total) by mouth daily. 03/13/14   Charolette Forward, MD  cholecalciferol (VITAMIN D)  1000 UNITS tablet Take 1,000 Units by mouth daily.    Historical Provider, MD  DULoxetine (CYMBALTA) 60 MG capsule Take 60 mg by mouth daily.     Historical Provider, MD  fluticasone (FLONASE) 50 MCG/ACT nasal spray Place 2 sprays into both nostrils 2 (two) times daily.     Historical Provider, MD  furosemide (LASIX) 20 MG tablet Take 20 mg by mouth daily. 02/01/15   Historical Provider, MD  HYDROcodone-acetaminophen (NORCO/VICODIN) 5-325 MG per tablet Take 1 tablet by mouth every 6 (six) hours as needed for moderate pain. Patient not taking: Reported on 02/11/2015 10/09/14   Dalia Heading, PA-C  hydrOXYzine (ATARAX/VISTARIL) 25 MG tablet Take 1 tablet (25 mg total) by mouth every 6 (six) hours. For  itching 03/14/15   Billy Fischer, MD  lisinopril (PRINIVIL) 10 MG tablet Take 1 tablet (10 mg total) by mouth daily. Patient not taking: Reported on 02/11/2015 08/01/14   Verlee Monte, MD  lisinopril-hydrochlorothiazide (PRINZIDE,ZESTORETIC) 20-25 MG per tablet Take 1 tablet by mouth daily. 11/29/14   Historical Provider, MD  loratadine (CLARITIN) 10 MG tablet Take 10 mg by mouth daily.     Historical Provider, MD  methylPREDNISolone (MEDROL DOSEPAK) 4 MG TBPK tablet follow package directions 03/14/15   Billy Fischer, MD  metoprolol succinate (TOPROL-XL) 100 MG 24 hr tablet Take 1 tablet (100 mg total) by mouth daily. Take with or immediately following a meal. Patient not taking: Reported on 02/11/2015 03/13/14   Charolette Forward, MD  metoprolol succinate (TOPROL-XL) 50 MG 24 hr tablet Take 50 mg by mouth at bedtime. Take with or immediately following a meal.    Historical Provider, MD  nitroGLYCERIN (NITROSTAT) 0.4 MG SL tablet Place 1 tablet (0.4 mg total) under the tongue every 5 (five) minutes x 3 doses as needed for chest pain. Patient taking differently: Place 0.4 mg under the tongue every 5 (five) minutes as needed for chest pain. Maximum 3 doses 03/13/14   Charolette Forward, MD  Oxycodone HCl 10 MG TABS Take 10 mg by mouth 3 (three) times daily as needed (pain).     Historical Provider, MD  pantoprazole (PROTONIX) 40 MG tablet Take 40 mg by mouth 2 (two) times daily.     Historical Provider, MD  predniSONE (DELTASONE) 50 MG tablet Take 1 tablet (50 mg total) by mouth daily. Patient not taking: Reported on 02/11/2015 10/09/14   Dalia Heading, PA-C  pregabalin (LYRICA) 75 MG capsule Take 75-150 mg by mouth 2 (two) times daily. Take 1 tablet (75 mg) every morning and 2 tablets (150 mg) at bedtime    Historical Provider, MD  tiZANidine (ZANAFLEX) 4 MG capsule Take 4 mg by mouth every 8 (eight) hours as needed for muscle spasms.  01/29/15   Historical Provider, MD  topiramate (TOPAMAX) 100 MG tablet Take 100 mg  by mouth 2 (two) times daily. 01/28/15   Historical Provider, MD   Triage Vitals: BP 152/92 mmHg  Pulse 88  Temp(Src) 98.1 F (36.7 C) (Oral)  Resp 14  Ht 4\' 11"  (1.499 m)  Wt 283 lb (128.368 kg)  BMI 57.13 kg/m2  SpO2 97% Physical Exam  Constitutional: She is oriented to person, place, and time. She appears well-developed and well-nourished. No distress.  HENT:  Head: Normocephalic and atraumatic.  Eyes: Conjunctivae and EOM are normal.  Neck: Neck supple. No tracheal deviation present.  Cardiovascular: Normal rate.   Pulmonary/Chest: Effort normal. No respiratory distress.  Musculoskeletal: Normal range of motion.  Antalgic gait  TTP over lumbar spine and paraspinous muscles TTP at right buttock Neurovascularly intact bilaterally  Neurological: She is alert and oriented to person, place, and time. She has normal strength. She is not disoriented. She displays no tremor. No sensory deficit. She exhibits normal muscle tone. She displays no seizure activity. Coordination normal.  Antalgic gait  Skin: Skin is warm, dry and intact. No rash noted. She is not diaphoretic. No cyanosis or erythema. No pallor. Nails show no clubbing.  Psychiatric: She has a normal mood and affect. Her behavior is normal.  Nursing note and vitals reviewed.   ED Course  Procedures  DIAGNOSTIC STUDIES: Oxygen Saturation is 97% on RA, normal by my interpretation.    COORDINATION OF CARE: 11:28 PM Discussed treatment plan which includes Decadron IM and Toradol IM with pt at bedside and pt agreed to plan.  Labs Review Labs Reviewed - No data to display  Imaging Review No results found.   EKG Interpretation None      MDM   Final diagnoses:  None   Pt with exacerbation of chronic back pain, has already taken narcotic pain medicine.  No neurological deficits and normal neuro exam.  Patient can walk but states is painful.  No loss of bowel or bladder control.  No concern for cauda equina.  No fever,  night sweats, weight loss, h/o cancer, IVDU.   Will treat with decadron and IM Toradol for sciatica sx.  I have explained to the pt that I cannot give more narcotics for her chronic pain.  She was urged to contact her physician to get pain management or to expedite her spinal injection.       I personally performed the services described in this documentation, which was scribed in my presence. The recorded information has been reviewed and is accurate.      Delsa Grana, PA-C 04/08/15 0254  Pattricia Boss, MD 04/17/15 [phone]

## 2015-04-02 NOTE — Discharge Instructions (Signed)
You have been seen for worsening of your chronic back pain.  You have been given a toradol and decadron injection, with prescription of prednisone to treat the inflammation in your back and sciatic.  Please contact your neurologist for follow up on your spinal injection procedure.  Take your home narcotic pain medicine as prescribed.  Sciatica Sciatica is pain, weakness, numbness, or tingling along the path of the sciatic nerve. The nerve starts in the lower back and runs down the back of each leg. The nerve controls the muscles in the lower leg and in the back of the knee, while also providing sensation to the back of the thigh, lower leg, and the sole of your foot. Sciatica is a symptom of another medical condition. For instance, nerve damage or certain conditions, such as a herniated disk or bone spur on the spine, pinch or put pressure on the sciatic nerve. This causes the pain, weakness, or other sensations normally associated with sciatica. Generally, sciatica only affects one side of the body. CAUSES   Herniated or slipped disc.  Degenerative disk disease.  A pain disorder involving the narrow muscle in the buttocks (piriformis syndrome).  Pelvic injury or fracture.  Pregnancy.  Tumor (rare). SYMPTOMS  Symptoms can vary from mild to very severe. The symptoms usually travel from the low back to the buttocks and down the back of the leg. Symptoms can include:  Mild tingling or dull aches in the lower back, leg, or hip.  Numbness in the back of the calf or sole of the foot.  Burning sensations in the lower back, leg, or hip.  Sharp pains in the lower back, leg, or hip.  Leg weakness.  Severe back pain inhibiting movement. These symptoms may get worse with coughing, sneezing, laughing, or prolonged sitting or standing. Also, being overweight may worsen symptoms. DIAGNOSIS  Your caregiver will perform a physical exam to look for common symptoms of sciatica. He or she may ask you to  do certain movements or activities that would trigger sciatic nerve pain. Other tests may be performed to find the cause of the sciatica. These may include:  Blood tests.  X-rays.  Imaging tests, such as an MRI or CT scan. TREATMENT  Treatment is directed at the cause of the sciatic pain. Sometimes, treatment is not necessary and the pain and discomfort goes away on its own. If treatment is needed, your caregiver may suggest:  Over-the-counter medicines to relieve pain.  Prescription medicines, such as anti-inflammatory medicine, muscle relaxants, or narcotics.  Applying heat or ice to the painful area.  Steroid injections to lessen pain, irritation, and inflammation around the nerve.  Reducing activity during periods of pain.  Exercising and stretching to strengthen your abdomen and improve flexibility of your spine. Your caregiver may suggest losing weight if the extra weight makes the back pain worse.  Physical therapy.  Surgery to eliminate what is pressing or pinching the nerve, such as a bone spur or part of a herniated disk. HOME CARE INSTRUCTIONS   Only take over-the-counter or prescription medicines for pain or discomfort as directed by your caregiver.  Apply ice to the affected area for 20 minutes, 3-4 times a day for the first 48-72 hours. Then try heat in the same way.  Exercise, stretch, or perform your usual activities if these do not aggravate your pain.  Attend physical therapy sessions as directed by your caregiver.  Keep all follow-up appointments as directed by your caregiver.  Do not wear  high heels or shoes that do not provide proper support.  Check your mattress to see if it is too soft. A firm mattress may lessen your pain and discomfort. SEEK IMMEDIATE MEDICAL CARE IF:   You lose control of your bowel or bladder (incontinence).  You have increasing weakness in the lower back, pelvis, buttocks, or legs.  You have redness or swelling of your  back.  You have a burning sensation when you urinate.  You have pain that gets worse when you lie down or awakens you at night.  Your pain is worse than you have experienced in the past.  Your pain is lasting longer than 4 weeks.  You are suddenly losing weight without reason. MAKE SURE YOU:  Understand these instructions.  Will watch your condition.  Will get help right away if you are not doing well or get worse. Document Released: 09/09/2001 Document Revised: 03/16/2012 Document Reviewed: 01/25/2012 [facility] Patient Information 2015 [place], [place]. This information is not intended to replace advice given to you by your health care provider. Make sure you discuss any questions you have with your health care provider.  Spinal Injection The instructions below tell you what to do to get ready for the spinal shot (injection).  BEFORE THE TEST  Do not take any blood-thinning medicines as told by your doctor.  Tell your doctor if you are taking medicine, supplements, or herbs for arthritis. Do not take any of these herbs:  Garlic.  Ginseng.  Ginkgo.  You must be free from any infection. This includes your skin, teeth, kidney, bladder, or lungs. Tell your doctor if you have a:  Fever.  Runny nose.  Cough.  Tell your doctor if you have had an allergic reaction to:  Contrast dye.  Iodine.  Medicines used to lessen skin pain.  Steroid medicines.  Tell your doctor if you are pregnant or if there is any chance that you may be pregnant. Document Released: 12/12/2008 Document Revised: 01/30/2014 Document Reviewed: 12/12/2008 [facility] Patient Information 2015 [place], [place]. This information is not intended to replace advice given to you by your health care provider. Make sure you discuss any questions you have with your health care provider.

## 2015-04-02 NOTE — ED Notes (Signed)
Pt. reports chronic low back pain worse this week , denies injury or fall .

## 2015-04-20 IMAGING — US US SOFT TISSUE HEAD/NECK
1 series · 13 of 25 positions shown · non-contrast
Comparison: Thyroid ultrasound 11/17/2012; 11/13/2011

CLINICAL DATA: Nontoxic multi nodular goiter

EXAM:
THYROID ULTRASOUND
TECHNIQUE: Ultrasound examination of the thyroid gland and adjacent soft
tissues was performed.

[Series 1: us soft tissue head/neck · 0.07mm/px · 13 of 52 slices shown]
[im 1/52]
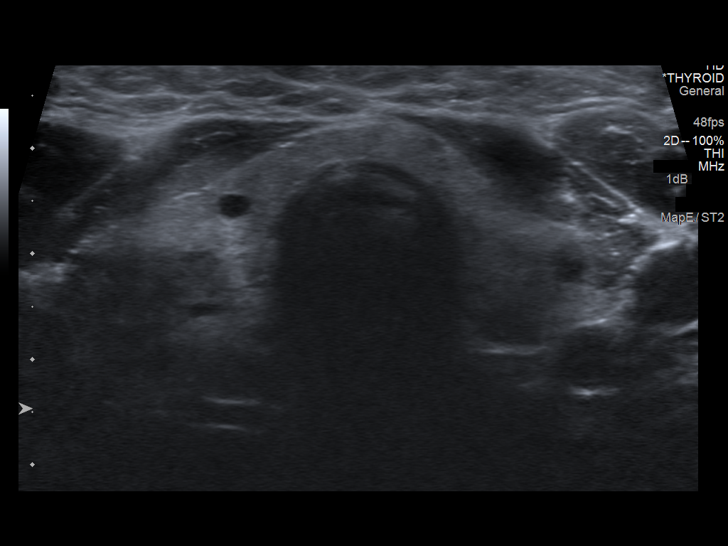
[im 5/52]
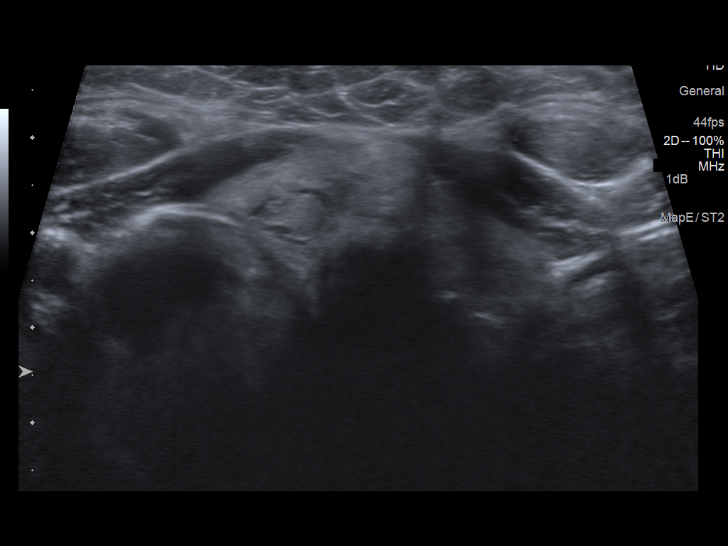
[im 9/52]
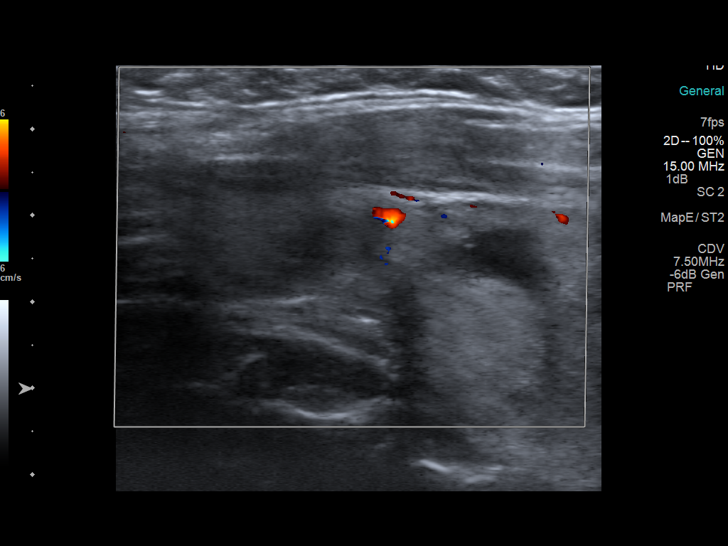
[im 13/52]
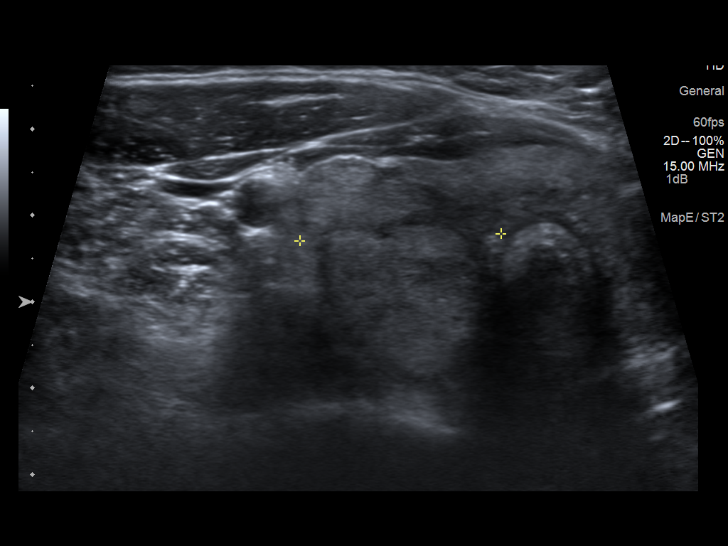
[im 18/52]
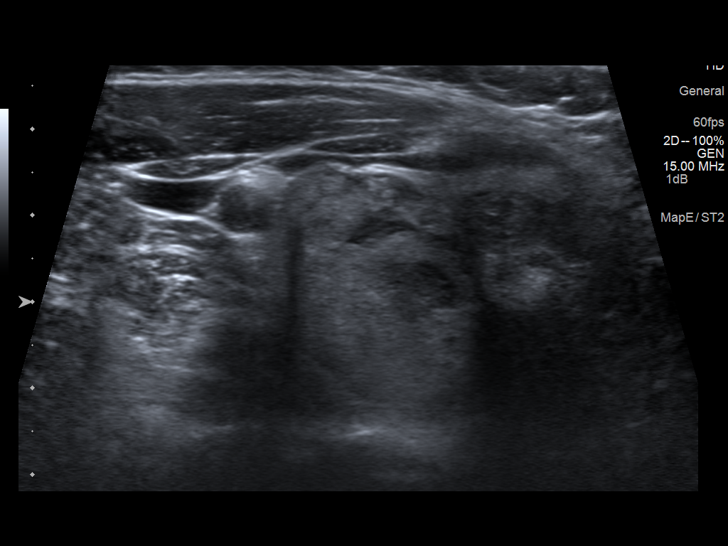
[im 22/52]
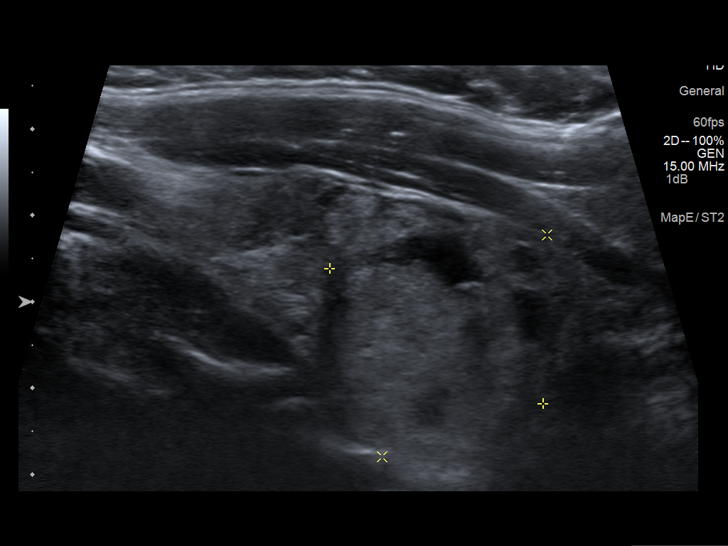
[im 26/52]
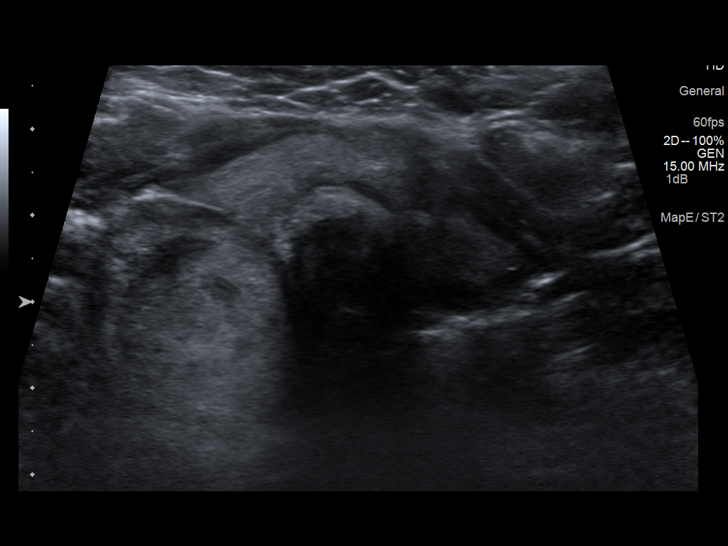
[im 30/52]
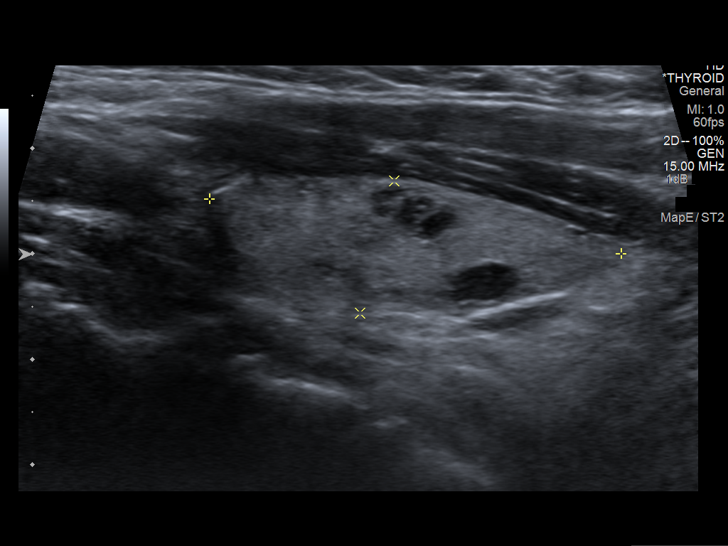
[im 35/52]
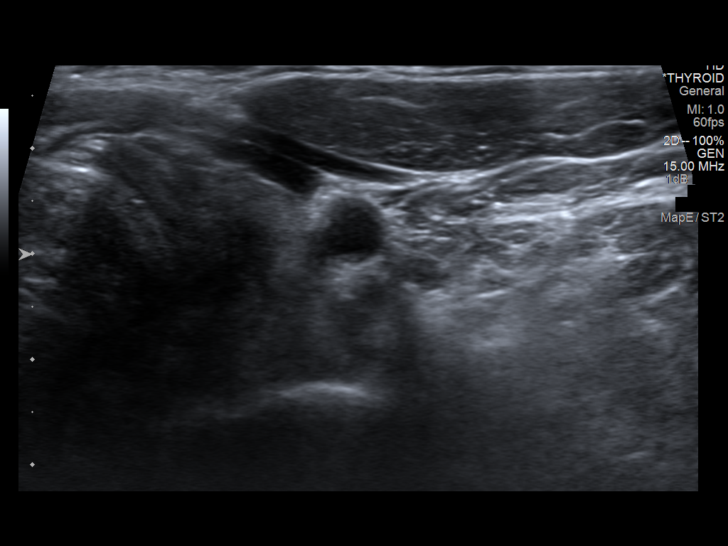
[im 39/52]
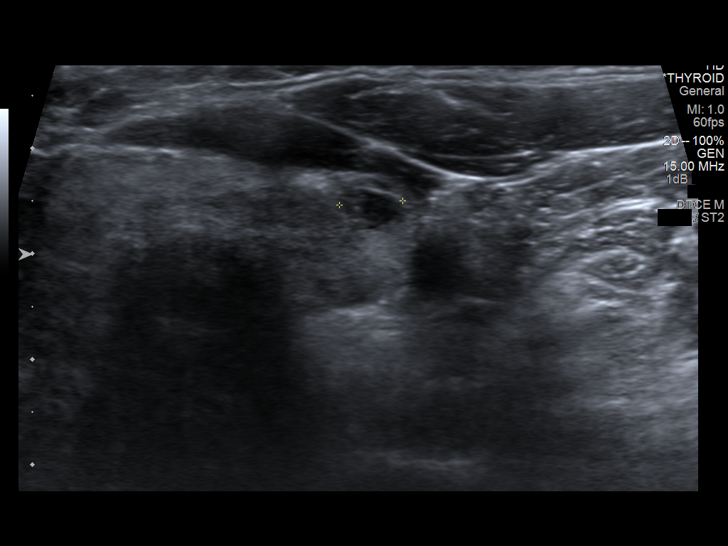
[im 43/52]
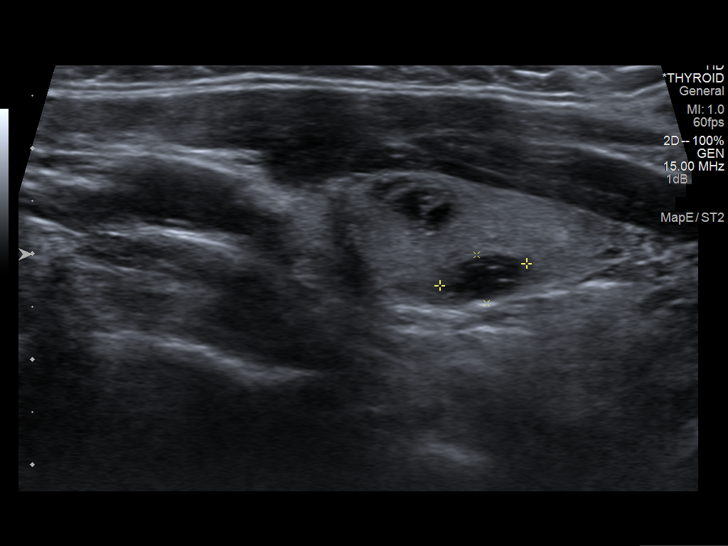
[im 47/52]
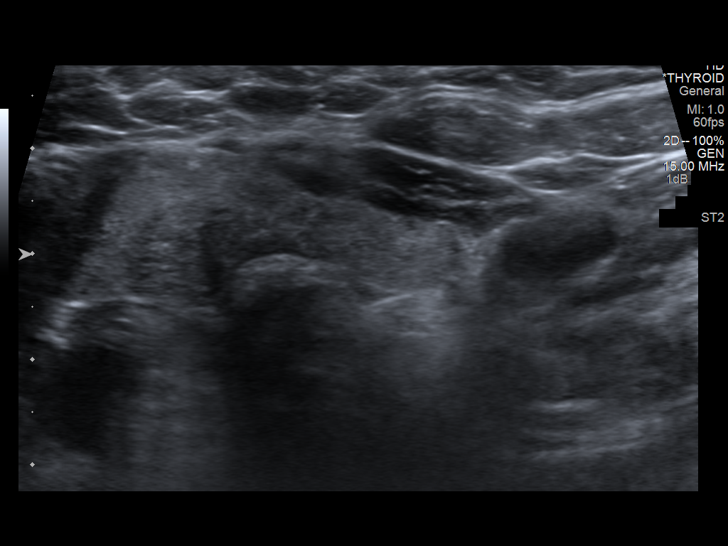
[im 52/52]
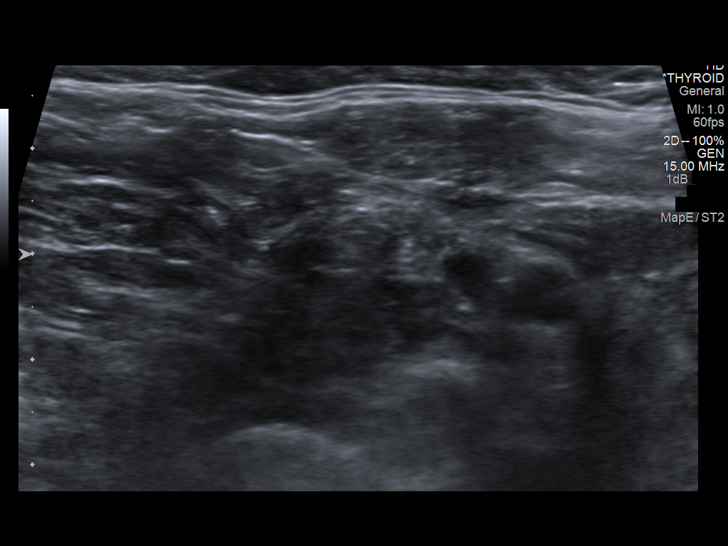

[13 of 25 positions shown; findings below may reference images not displayed]

FINDINGS: There is diffuse heterogeneity of the thyroid parenchymal
echotexture.

Right thyroid lobe

Measurements: Borderline enlarged measuring 5.8 x 2.2 x 2.3 cm.

Right, mid - 1.6 x 0.8 x 1.1 cm - hypoechoic, ill-defined - grossly
unchanged since the [DATE] examination.

Right, inferior - 2.9 x 2.9 x 3.2 cm - mixed echogenic, partially
cystic, predominantly solid - grossly unchanged in size when
compared to the [DATE] examination, previously measuring, 2.5 x
x 3.2 cm, with slight differences attributable to scan plane.

Left thyroid lobe

Measurements: Normal in size measuring 3.9 x 1.3 x 1.5 cm.

Left, mid, anterior - 0.9 x 0.6 x 0.6 cm - mixed echogenic,
predominantly cystic - minimally increased in size in interval,
previously, 0.5 x 0.6 x 0.3 cm

Left, mid, inferior - 0.9 x 0.5 x 0.7 cm - mixed echogenic, cystic,
grossly unchanged, previously, 0.7 cm.

Isthmus

Thickness: Borderline enlarged measuring 0.6 cm in diameter.. No
nodules visualized.

Lymphadenopathy

None visualized.
IMPRESSION: Similar findings of multi nodular goiter. Previous thyroid
ultrasound report states patient underwent a thyroid biopsy on
12/01/2011 though this examination is not readily apparent on the
patient's radiologic timeline. Correlation with prior biopsy results
is recommended. If biopsy not performed, the dominant approximately
3.2 cm complex nodule within the inferior aspect of the right lobe
of the thyroid meets imaging criteria for percutaneous sampling.
This recommendation follows the consensus statement: Management of
Thyroid Nodules Detected at US: Society of Radiologists in
Ultrasound Consensus Conference Statement. Radiology 2445;

## 2015-05-25 ENCOUNTER — Encounter (HOSPITAL_COMMUNITY): Payer: Self-pay | Admitting: Emergency Medicine

## 2015-05-25 DIAGNOSIS — E669 Obesity, unspecified: Secondary | ICD-10-CM | POA: Diagnosis not present

## 2015-05-25 DIAGNOSIS — M1990 Unspecified osteoarthritis, unspecified site: Secondary | ICD-10-CM | POA: Diagnosis not present

## 2015-05-25 DIAGNOSIS — M545 Low back pain: Secondary | ICD-10-CM | POA: Diagnosis present

## 2015-05-25 DIAGNOSIS — Z8673 Personal history of transient ischemic attack (TIA), and cerebral infarction without residual deficits: Secondary | ICD-10-CM | POA: Insufficient documentation

## 2015-05-25 DIAGNOSIS — Z7951 Long term (current) use of inhaled steroids: Secondary | ICD-10-CM | POA: Diagnosis not present

## 2015-05-25 DIAGNOSIS — E782 Mixed hyperlipidemia: Secondary | ICD-10-CM | POA: Diagnosis not present

## 2015-05-25 DIAGNOSIS — Z7982 Long term (current) use of aspirin: Secondary | ICD-10-CM | POA: Diagnosis not present

## 2015-05-25 DIAGNOSIS — I10 Essential (primary) hypertension: Secondary | ICD-10-CM | POA: Insufficient documentation

## 2015-05-25 DIAGNOSIS — Z79899 Other long term (current) drug therapy: Secondary | ICD-10-CM | POA: Insufficient documentation

## 2015-05-25 NOTE — ED Notes (Signed)
Patient here with complaint of lower bilateral back pain. States history of fibromyalgia. Pain is similar to that illness, but has not improved with prescribed medications. Denies acute illness.

## 2015-05-26 ENCOUNTER — Emergency Department (HOSPITAL_COMMUNITY)
Admission: EM | Admit: 2015-05-26 | Discharge: 2015-05-26 | Disposition: A | Discharge status: Home / Self Care | Payer: Medicaid Other | Attending: Emergency Medicine | Admitting: Emergency Medicine

## 2015-05-26 DIAGNOSIS — M5440 Lumbago with sciatica, unspecified side: Secondary | ICD-10-CM

## 2015-05-26 DIAGNOSIS — M545 Low back pain, unspecified: Secondary | ICD-10-CM

## 2015-05-26 MED ORDER — PREDNISONE 20 MG PO TABS: 40.0000 mg | ORAL_TABLET | Freq: Every day | ORAL | Status: DC

## 2015-05-26 MED ORDER — PREDNISONE 20 MG PO TABS
60.0000 mg | ORAL_TABLET | Freq: Once | ORAL | Status: AC
  Administered 2015-05-26: 60 mg via ORAL
  Filled 2015-05-26: qty 3

## 2015-05-26 NOTE — Discharge Instructions (Signed)

## 2015-05-26 NOTE — ED Provider Notes (Signed)
CSN: [id number]     Arrival date & time 05/25/15  2246 History   First MD Initiated Contact with Patient 05/26/15 0002     Chief Complaint  Patient presents with  . Back Pain     (Consider location/radiation/quality/duration/timing/severity/associated sxs/prior Treatment) HPI Comments: Patient with PMH of fibromyalgia presents to the ED with a chief complaint of low back pain that radiates to her right leg.  The pain started today.  She denies any injuries.  States that this sometimes happens when her fibromyalgia flares up.  She has tried taking oxycodone 10s, cymbalta, and lyrica with no relief.  It is aggravated with movement and palpation.  She reports pins and needles sensation in her right leg.  No bowel or bladder symptoms.  The history is provided by the patient. No language interpreter was used.    Past Medical History  Diagnosis Date  . Hypertension   . Hypercholesteremia   . Lower back pain   . Nontoxic uninodular goiter     sees dr vollmer at [facility]  . Calculus of gallbladder without mention of cholecystitis or obstruction   . Arthritis   . Stroke 2000  . Migraine   . Sleep apnea     STOPBANG=5  . Fibromyalgia   . Obesity    Past Surgical History  Procedure Laterality Date  . Knee arthroscopy  one 1995 and 1 in 1997    both knees done  . Abdominal hysterectomy    . Cesarean section  yrs ago    done x 2  . Surgery for endometriosis  yrs ago  . Thryoid biopsy  December 01, 2011     [facility]  . Cholecystectomy  01/05/2012    Procedure: LAPAROSCOPIC CHOLECYSTECTOMY WITH INTRAOPERATIVE CHOLANGIOGRAM;  Surgeon: Pedro Earls, MD;  Location: [facility];  Service: General;  Laterality: N/A;  . Colonoscopy  10/08/2012    Procedure: COLONOSCOPY;  Surgeon: Beryle Beams, MD;  Location: [facility];  Service: Endoscopy;  Laterality: N/A;   Family History  Problem Relation Age of Onset  . Cancer Brother     colon and lung  . Cancer Maternal Grandmother     colon    Social History  Substance Use Topics  . Smoking status: Never Smoker   . Smokeless tobacco: Never Used  . Alcohol Use: No   OB History    No data available     Review of Systems  Constitutional: Negative for fever and chills.  Gastrointestinal:       No bowel incontinence  Genitourinary:       No urinary incontinence  Musculoskeletal: Positive for myalgias, back pain and arthralgias.  Neurological:       No saddle anesthesia      Allergies  Shrimp and Naproxen  Home Medications   Prior to Admission medications   Medication Sig Start Date End Date Taking? Authorizing Provider  amLODipine (NORVASC) 10 MG tablet Take 10 mg by mouth daily. 11/29/14   Historical Provider, MD  aspirin EC 81 MG EC tablet Take 1 tablet (81 mg total) by mouth daily. 03/13/14   Charolette Forward, MD  cholecalciferol (VITAMIN D) 1000 UNITS tablet Take 1,000 Units by mouth daily.    Historical Provider, MD  cyclobenzaprine (FLEXERIL) 10 MG tablet Take 1 tablet (10 mg total) by mouth 2 (two) times daily as needed for muscle spasms. 04/02/15   Delsa Grana, PA-C  DULoxetine (CYMBALTA) 60 MG capsule Take 60 mg by mouth daily.  Historical Provider, MD  fluticasone (FLONASE) 50 MCG/ACT nasal spray Place 2 sprays into both nostrils 2 (two) times daily.     Historical Provider, MD  furosemide (LASIX) 20 MG tablet Take 20 mg by mouth daily. 02/01/15   Historical Provider, MD  HYDROcodone-acetaminophen (NORCO/VICODIN) 5-325 MG per tablet Take 1 tablet by mouth every 6 (six) hours as needed for moderate pain. Patient not taking: Reported on 02/11/2015 10/09/14   Dalia Heading, PA-C  hydrOXYzine (ATARAX/VISTARIL) 25 MG tablet Take 1 tablet (25 mg total) by mouth every 6 (six) hours. For itching 03/14/15   Billy Fischer, MD  lisinopril (PRINIVIL) 10 MG tablet Take 1 tablet (10 mg total) by mouth daily. Patient not taking: Reported on 02/11/2015 08/01/14   Verlee Monte, MD  lisinopril-hydrochlorothiazide  (PRINZIDE,ZESTORETIC) 20-25 MG per tablet Take 1 tablet by mouth daily. 11/29/14   Historical Provider, MD  loratadine (CLARITIN) 10 MG tablet Take 10 mg by mouth daily.     Historical Provider, MD  methylPREDNISolone (MEDROL DOSEPAK) 4 MG TBPK tablet follow package directions 03/14/15   Billy Fischer, MD  metoprolol succinate (TOPROL-XL) 100 MG 24 hr tablet Take 1 tablet (100 mg total) by mouth daily. Take with or immediately following a meal. Patient not taking: Reported on 02/11/2015 03/13/14   Charolette Forward, MD  metoprolol succinate (TOPROL-XL) 50 MG 24 hr tablet Take 50 mg by mouth at bedtime. Take with or immediately following a meal.    Historical Provider, MD  nitroGLYCERIN (NITROSTAT) 0.4 MG SL tablet Place 1 tablet (0.4 mg total) under the tongue every 5 (five) minutes x 3 doses as needed for chest pain. Patient taking differently: Place 0.4 mg under the tongue every 5 (five) minutes as needed for chest pain. Maximum 3 doses 03/13/14   Charolette Forward, MD  Oxycodone HCl 10 MG TABS Take 10 mg by mouth 3 (three) times daily as needed (pain).     Historical Provider, MD  pantoprazole (PROTONIX) 40 MG tablet Take 40 mg by mouth 2 (two) times daily.     Historical Provider, MD  predniSONE (DELTASONE) 20 MG tablet Take 2 tablets (40 mg total) by mouth daily. 05/26/15   Montine Circle, PA-C  pregabalin (LYRICA) 75 MG capsule Take 75-150 mg by mouth 2 (two) times daily. Take 1 tablet (75 mg) every morning and 2 tablets (150 mg) at bedtime    Historical Provider, MD  tiZANidine (ZANAFLEX) 4 MG capsule Take 4 mg by mouth every 8 (eight) hours as needed for muscle spasms.  01/29/15   Historical Provider, MD  topiramate (TOPAMAX) 100 MG tablet Take 100 mg by mouth 2 (two) times daily. 01/28/15   Historical Provider, MD   BP 160/93 mmHg  Pulse 102  Temp(Src) 98.8 F (37.1 C) (Oral)  Resp 18  SpO2 96% Physical Exam  Constitutional: She is oriented to person, place, and time. She appears well-developed and  well-nourished. No distress.  HENT:  Head: Normocephalic and atraumatic.  Eyes: Conjunctivae and EOM are normal. Right eye exhibits no discharge. Left eye exhibits no discharge. No scleral icterus.  Neck: Normal range of motion. Neck supple. No tracheal deviation present.  Cardiovascular: Normal rate, regular rhythm and normal heart sounds.  Exam reveals no gallop and no friction rub.   No murmur heard. Pulmonary/Chest: Effort normal and breath sounds normal. No respiratory distress. She has no wheezes.  Abdominal: Soft. She exhibits no distension. There is no tenderness.  Musculoskeletal: Normal range of motion.  Lumbar paraspinal muscles  tender to palpation, no bony tenderness, step-offs, or gross abnormality or deformity of spine, patient is able to ambulate, moves all extremities  Bilateral great toe extension intact Bilateral plantar/dorsiflexion intact  Neurological: She is alert and oriented to person, place, and time.  Sensation and strength intact bilaterally   Skin: Skin is warm. She is not diaphoretic.  Psychiatric: She has a normal mood and affect. Her behavior is normal. Judgment and thought content normal.  Nursing note and vitals reviewed.   ED Course  Procedures (including critical care time) Labs Review Labs Reviewed - No data to display  Imaging Review No results found. I have personally reviewed and evaluated these images and lab results as part of my medical decision-making.   EKG Interpretation None      MDM   Final diagnoses:  Low back pain with radiation, unspecified laterality    Patient with back pain.  No neurological deficits and normal neuro exam.  Patient is ambulatory.  No loss of bowel or bladder control.  Doubt cauda equina.  Denies fever,  doubt epidural abscess or other lesion. Recommend back exercises, stretching, RICE, and will treat with a short course of prednisone.  Encouraged the patient that there could be a need for additional  workup and/or imaging such as MRI, if the symptoms do not resolve. Patient advised that if the back pain does not resolve, or radiates, this could progress to more serious conditions and is encouraged to follow-up with PCP or orthopedics within 2 weeks.       Montine Circle, PA-C 05/26/15 4332  Everlene Balls, MD 05/26/15 [phone]

## 2015-05-29 IMAGING — US US THYROID BIOPSY
1 series · 14 of 14 positions shown · non-contrast
Comparison: none

CLINICAL DATA: Bx of Right dominant thyroid nodule, enlarging on
serial diagnostic studies

EXAM:
ULTRASOUND-GUIDED THYROID ASPIRATION BIOPSY
TECHNIQUE: The procedure, risks (including but not limited to bleeding,
infection, organ damage ), benefits, and alternatives were explained
to the patient. Questions regarding the procedure were encouraged
and answered. The patient understands and consents to the procedure.

[Series 1: us thyroid biopsy · 0.10mm/px · 14 acquisitions, 14 frames shown]
[im 1/14]
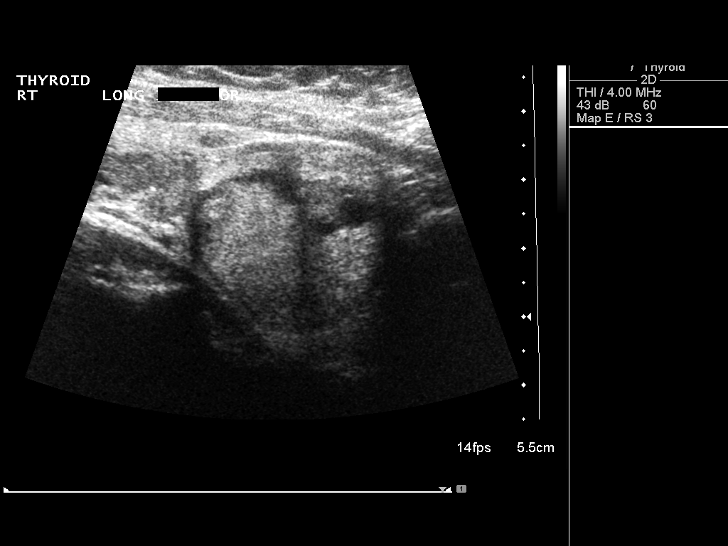
[im 2/14]
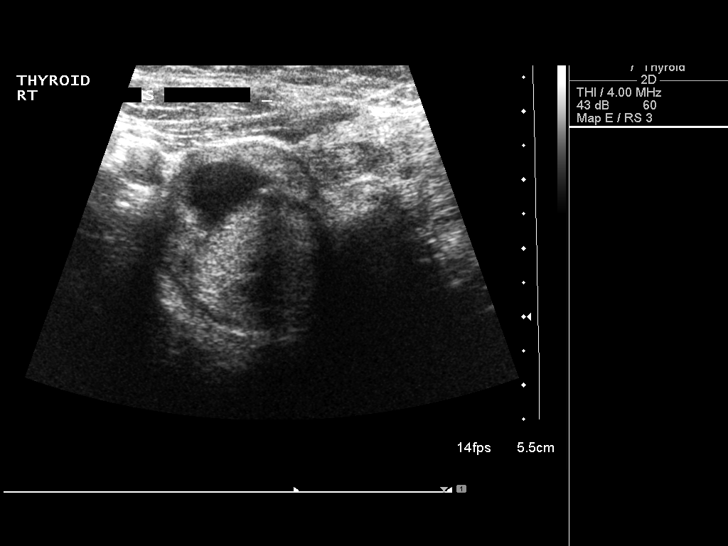
[im 3/14]
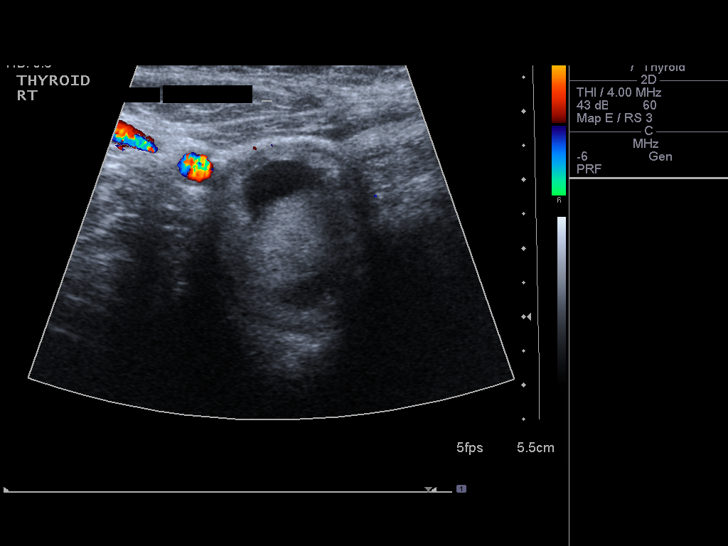
[im 4/14]
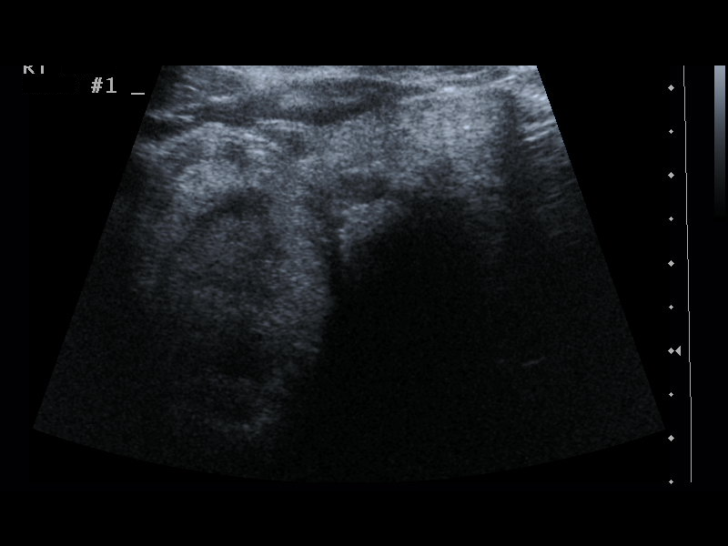
[im 5/14]
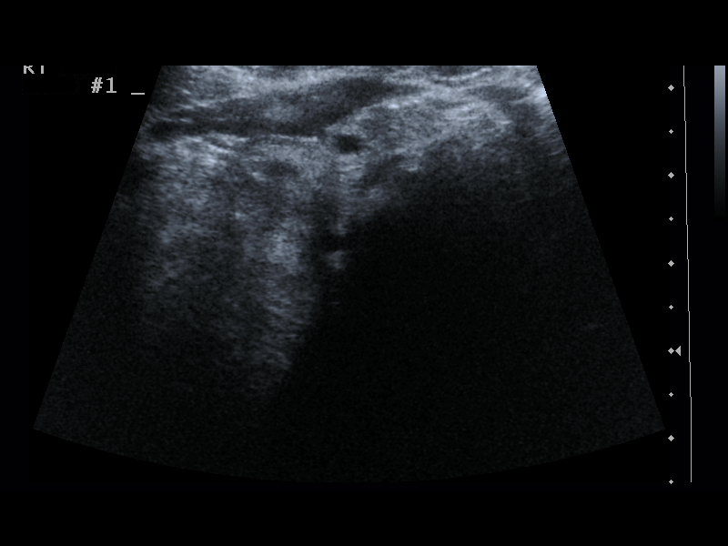
[im 6/14]
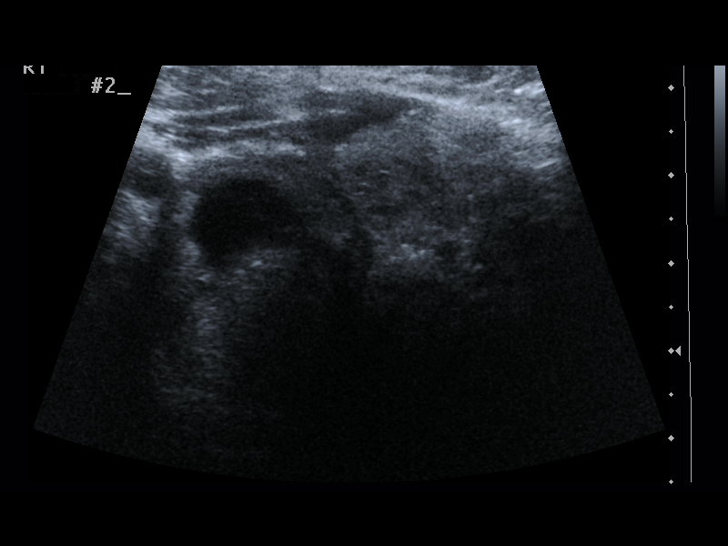
[im 7/14]
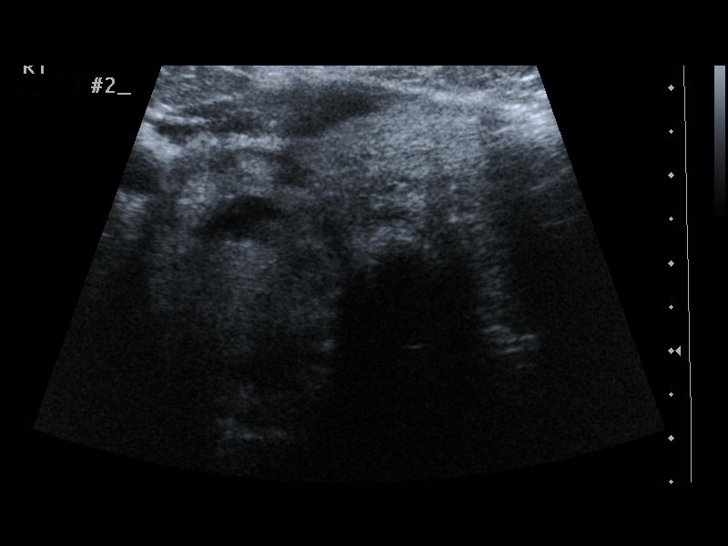
[im 8/14]
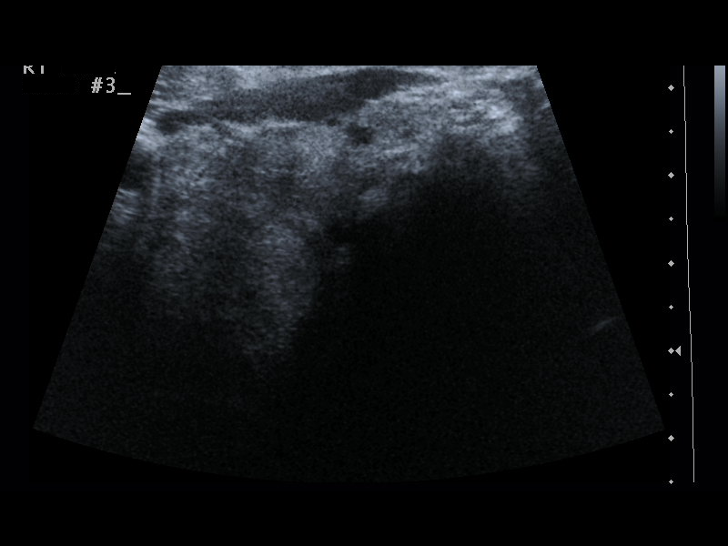
[im 9/14]
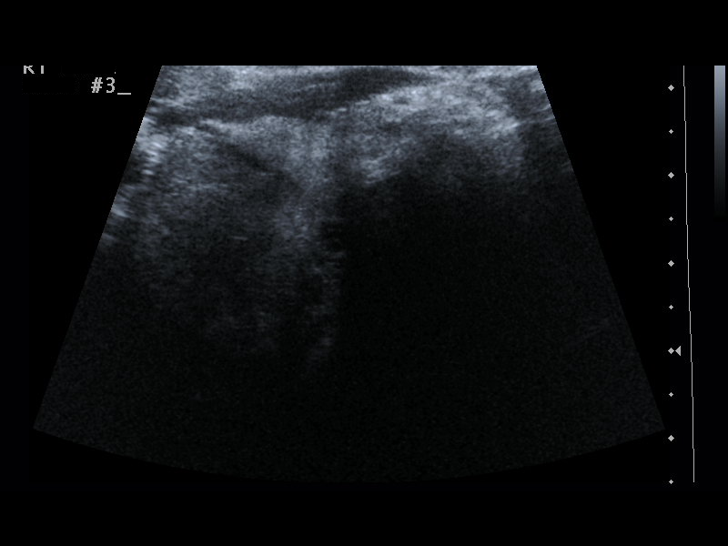
[im 10/14]
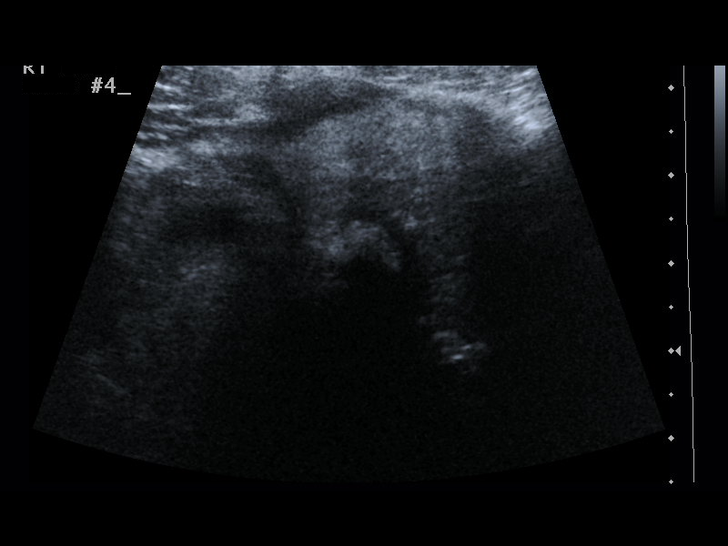
[im 11/14]
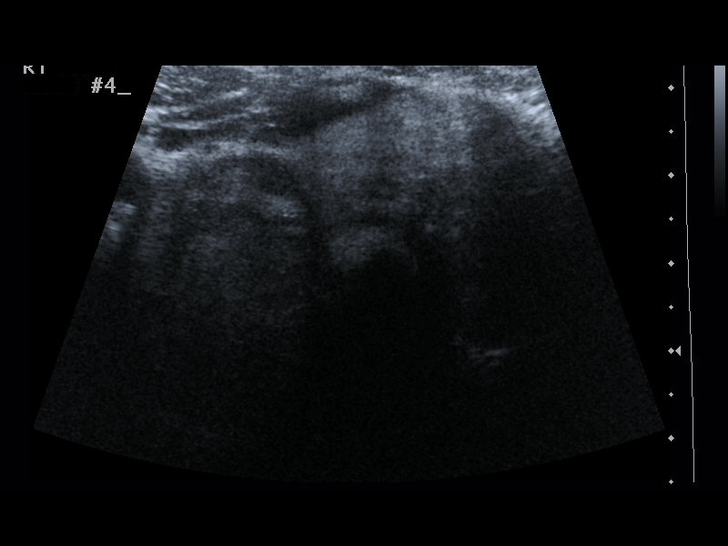
[im 12/14]
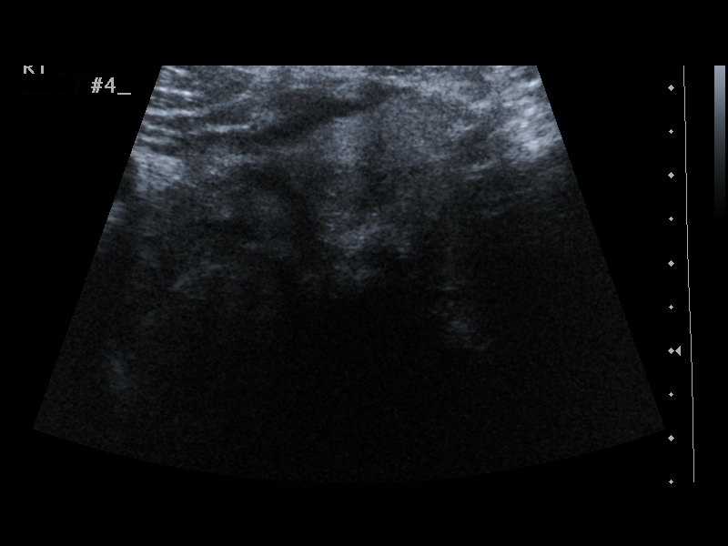
[im 13/14]
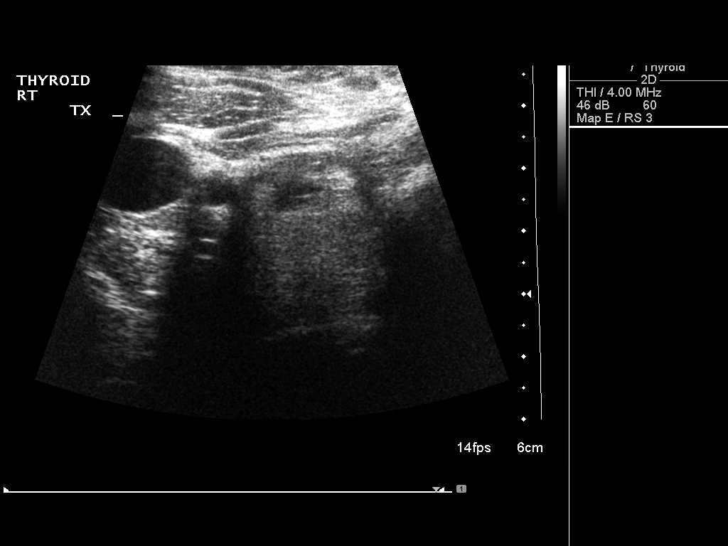
[im 14/14]
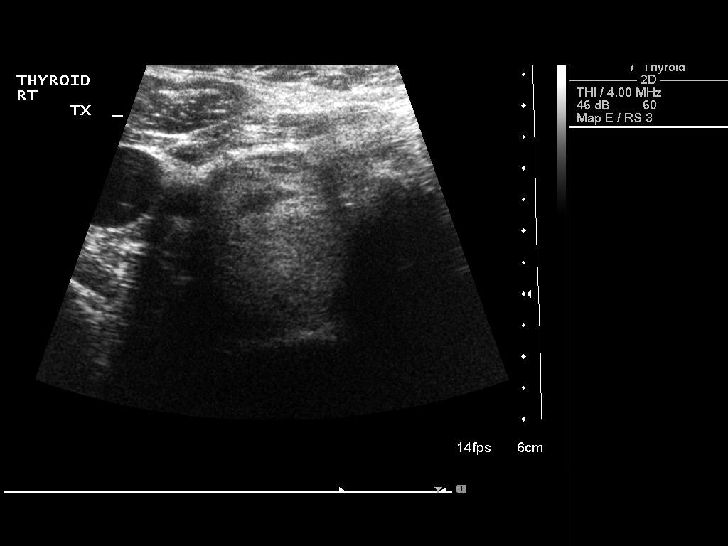

[14 of 14 positions shown; findings below may reference images not displayed]

Survey ultrasound was performed and the dominant lesion in the right
lobe was localized. An appropriate skin entry site was determined.
Skin was marked, then prepped with Betadine, draped in usual sterile
fashion, and infiltrated locally with 1% lidocaine. Under real-time
ultrasound guidance, 4 passes were made into the lesion with 25
gauge needles. The patient tolerated procedure well, with no
immediate complications.
IMPRESSION: 1. Technically successful ultrasound-guided thyroid aspiration
biopsy , enlarging dominant right nodule.

## 2015-06-08 ENCOUNTER — Emergency Department (HOSPITAL_COMMUNITY): Payer: Medicaid Other

## 2015-06-08 ENCOUNTER — Encounter (HOSPITAL_COMMUNITY): Payer: Self-pay

## 2015-06-08 ENCOUNTER — Emergency Department (HOSPITAL_COMMUNITY)
Admission: EM | Admit: 2015-06-08 | Discharge: 2015-06-08 | Disposition: A | Discharge status: Home / Self Care | Payer: Medicaid Other | Attending: Emergency Medicine | Admitting: Emergency Medicine

## 2015-06-08 DIAGNOSIS — I10 Essential (primary) hypertension: Secondary | ICD-10-CM | POA: Diagnosis not present

## 2015-06-08 DIAGNOSIS — G4459 Other complicated headache syndrome: Secondary | ICD-10-CM | POA: Diagnosis not present

## 2015-06-08 DIAGNOSIS — Z7982 Long term (current) use of aspirin: Secondary | ICD-10-CM | POA: Diagnosis not present

## 2015-06-08 DIAGNOSIS — R202 Paresthesia of skin: Secondary | ICD-10-CM | POA: Diagnosis not present

## 2015-06-08 DIAGNOSIS — Z8673 Personal history of transient ischemic attack (TIA), and cerebral infarction without residual deficits: Secondary | ICD-10-CM | POA: Insufficient documentation

## 2015-06-08 DIAGNOSIS — M797 Fibromyalgia: Secondary | ICD-10-CM | POA: Diagnosis not present

## 2015-06-08 DIAGNOSIS — R209 Unspecified disturbances of skin sensation: Secondary | ICD-10-CM

## 2015-06-08 DIAGNOSIS — M1990 Unspecified osteoarthritis, unspecified site: Secondary | ICD-10-CM | POA: Diagnosis not present

## 2015-06-08 DIAGNOSIS — E780 Pure hypercholesterolemia: Secondary | ICD-10-CM | POA: Insufficient documentation

## 2015-06-08 DIAGNOSIS — R51 Headache: Secondary | ICD-10-CM | POA: Diagnosis present

## 2015-06-08 DIAGNOSIS — Z79899 Other long term (current) drug therapy: Secondary | ICD-10-CM | POA: Diagnosis not present

## 2015-06-08 DIAGNOSIS — Z8719 Personal history of other diseases of the digestive system: Secondary | ICD-10-CM | POA: Insufficient documentation

## 2015-06-08 DIAGNOSIS — E669 Obesity, unspecified: Secondary | ICD-10-CM | POA: Insufficient documentation

## 2015-06-08 LAB — CBC
HCT: 43.2 % (ref 36.0–46.0)
Hemoglobin: 13.8 g/dL (ref 12.0–15.0)
MCH: 28 pg (ref 26.0–34.0)
MCHC: 31.9 g/dL (ref 30.0–36.0)
MCV: 87.8 fL (ref 78.0–100.0)
Platelets: 289 10*3/uL (ref 150–400)
RBC: 4.92 MIL/uL (ref 3.87–5.11)
RDW: 15.7 % — ABNORMAL HIGH (ref 11.5–15.5)
WBC: 11.5 10*3/uL — ABNORMAL HIGH (ref 4.0–10.5)

## 2015-06-08 LAB — DIFFERENTIAL
Basophils Absolute: 0 10*3/uL (ref 0.0–0.1)
Basophils Relative: 0 % (ref 0–1)
Eosinophils Absolute: 0 10*3/uL (ref 0.0–0.7)
Eosinophils Relative: 0 % (ref 0–5)
Lymphocytes Relative: 36 % (ref 12–46)
Lymphs Abs: 4.1 10*3/uL — ABNORMAL HIGH (ref 0.7–4.0)
Monocytes Absolute: 0.8 10*3/uL (ref 0.1–1.0)
Monocytes Relative: 7 % (ref 3–12)
Neutro Abs: 6.6 10*3/uL (ref 1.7–7.7)
Neutrophils Relative %: 57 % (ref 43–77)

## 2015-06-08 LAB — I-STAT CHEM 8, ED
BUN: 13 mg/dL (ref 6–20)
Calcium, Ion: 1.13 mmol/L (ref 1.12–1.23)
Chloride: 101 mmol/L (ref 101–111)
Creatinine, Ser: 1 mg/dL (ref 0.44–1.00)
Glucose, Bld: 100 mg/dL — ABNORMAL HIGH (ref 65–99)
HCT: 49 % — ABNORMAL HIGH (ref 36.0–46.0)
Hemoglobin: 16.7 g/dL — ABNORMAL HIGH (ref 12.0–15.0)
Potassium: 3.8 mmol/L (ref 3.5–5.1)
Sodium: 140 mmol/L (ref 135–145)
TCO2: 26 mmol/L (ref 0–100)

## 2015-06-08 LAB — COMPREHENSIVE METABOLIC PANEL
ALT: 22 U/L (ref 14–54)
AST: 19 U/L (ref 15–41)
Albumin: 3.6 g/dL (ref 3.5–5.0)
Alkaline Phosphatase: 93 U/L (ref 38–126)
Anion gap: 9 (ref 5–15)
BUN: 10 mg/dL (ref 6–20)
CO2: 25 mmol/L (ref 22–32)
Calcium: 9 mg/dL (ref 8.9–10.3)
Chloride: 104 mmol/L (ref 101–111)
Creatinine, Ser: 0.92 mg/dL (ref 0.44–1.00)
GFR calc Af Amer: >60 mL/min (ref >60)
GFR calc non Af Amer: >60 mL/min (ref >60)
Glucose, Bld: 104 mg/dL — ABNORMAL HIGH (ref 65–99)
Potassium: 3.9 mmol/L (ref 3.5–5.1)
Sodium: 138 mmol/L (ref 135–145)
Total Bilirubin: 0.7 mg/dL (ref 0.3–1.2)
Total Protein: 7.7 g/dL (ref 6.5–8.1)

## 2015-06-08 LAB — APTT: aPTT: 31 seconds (ref 24–37)

## 2015-06-08 LAB — PROTIME-INR
INR: 1.07 (ref 0.00–1.49)
Prothrombin Time: 14.1 seconds (ref 11.6–15.2)

## 2015-06-08 LAB — CBG MONITORING, ED: Glucose-Capillary: 87 mg/dL (ref 65–99)

## 2015-06-08 LAB — I-STAT TROPONIN, ED: Troponin i, poc: 0.01 ng/mL (ref 0.00–0.08)

## 2015-06-08 MED ORDER — DIPHENHYDRAMINE HCL 50 MG/ML IJ SOLN
25.0000 mg | Freq: Once | INTRAMUSCULAR | Status: AC
  Administered 2015-06-08: 25 mg via INTRAVENOUS
  Filled 2015-06-08: qty 1

## 2015-06-08 MED ORDER — METOCLOPRAMIDE HCL 5 MG/ML IJ SOLN
10.0000 mg | Freq: Once | INTRAMUSCULAR | Status: AC
  Administered 2015-06-08: 10 mg via INTRAVENOUS
  Filled 2015-06-08: qty 2

## 2015-06-08 MED ORDER — DEXAMETHASONE SODIUM PHOSPHATE 10 MG/ML IJ SOLN
10.0000 mg | Freq: Once | INTRAMUSCULAR | Status: AC
  Administered 2015-06-08: 10 mg via INTRAVENOUS
  Filled 2015-06-08: qty 1

## 2015-06-08 NOTE — Code Documentation (Signed)
54yo female arriving to [facility] via private vehicle at 1244.  Patient reports waking up this morning at 0530 with headache and right facial weakness.  She also reports right sided tingling.  Code stroke called on arrival.  Patient to CT.  Stroke team to the bedside.  NIHSS 2, see documentation for details and code stroke times.  Patient with right leg drift and decreased sensation to the left face, arm and leg on exam.  Patient reports going to bed at 2000 last night at her baseline.  Patient is outside the window for treatment with tPA.  No acute stroke treatment at this time.  Bedside handoff with ED RN Dreama.

## 2015-06-08 NOTE — ED Provider Notes (Signed)
CSN: [id number]     Arrival date & time 06/08/15  1244 History   First MD Initiated Contact with Patient 06/08/15 1311     Chief Complaint  Patient presents with  . Numbness  . Headache    An emergency department physician performed an initial assessment on this suspected stroke patient at 1257. (Consider location/radiation/quality/duration/timing/severity/associated sxs/prior Treatment) Patient is a 54 y.o. female presenting with headaches. The history is provided by the patient.  Headache Pain location:  Frontal Quality:  Dull Radiates to:  Does not radiate Onset quality:  Gradual Duration:  1 day Timing:  Constant Progression:  Unchanged Chronicity:  New Similar to prior headaches: no   Relieved by:  Nothing Worsened by:  Nothing Ineffective treatments:  None tried Associated symptoms: facial pain (with asymmetry per Pt) and paresthesias (left face)   Associated symptoms: no vomiting and no weakness     Past Medical History  Diagnosis Date  . Hypertension   . Hypercholesteremia   . Lower back pain   . Nontoxic uninodular goiter     sees dr vollmer at [facility]  . Calculus of gallbladder without mention of cholecystitis or obstruction   . Arthritis   . Stroke 2000  . Migraine   . Sleep apnea     STOPBANG=5  . Fibromyalgia   . Obesity    Past Surgical History  Procedure Laterality Date  . Knee arthroscopy  one 1995 and 1 in 1997    both knees done  . Abdominal hysterectomy    . Cesarean section  yrs ago    done x 2  . Surgery for endometriosis  yrs ago  . Thryoid biopsy  December 01, 2011     at [facility]  . Cholecystectomy  01/05/2012    Procedure: LAPAROSCOPIC CHOLECYSTECTOMY WITH INTRAOPERATIVE CHOLANGIOGRAM;  Surgeon: Pedro Earls, MD;  Location: [facility];  Service: General;  Laterality: N/A;  . Colonoscopy  10/08/2012    Procedure: COLONOSCOPY;  Surgeon: Beryle Beams, MD;  Location: [facility];  Service: Endoscopy;  Laterality: N/A;   Family History   Problem Relation Age of Onset  . Cancer Brother     colon and lung  . Cancer Maternal Grandmother     colon   Social History  Substance Use Topics  . Smoking status: Never Smoker   . Smokeless tobacco: Never Used  . Alcohol Use: No   OB History    No data available     Review of Systems  Gastrointestinal: Negative for vomiting.  Neurological: Positive for headaches and paresthesias (left face). Negative for weakness.  All other systems reviewed and are negative.     Allergies  Shrimp and Naproxen  Home Medications   Prior to Admission medications   Medication Sig Start Date End Date Taking? Authorizing Provider  amLODipine (NORVASC) 10 MG tablet Take 10 mg by mouth daily. 11/29/14  Yes Historical Provider, MD  aspirin EC 81 MG EC tablet Take 1 tablet (81 mg total) by mouth daily. 03/13/14  Yes Charolette Forward, MD  cholecalciferol (VITAMIN D) 1000 UNITS tablet Take 1,000 Units by mouth daily.   Yes Historical Provider, MD  DULoxetine (CYMBALTA) 60 MG capsule Take 60 mg by mouth daily.    Yes Historical Provider, MD  lisinopril-hydrochlorothiazide (PRINZIDE,ZESTORETIC) 20-25 MG per tablet Take 1 tablet by mouth daily. 11/29/14  Yes Historical Provider, MD  loratadine (CLARITIN) 10 MG tablet Take 10 mg by mouth daily.    Yes Historical Provider, MD  metoprolol  succinate (TOPROL-XL) 100 MG 24 hr tablet Take 1 tablet (100 mg total) by mouth daily. Take with or immediately following a meal. Patient taking differently: Take 50-100 mg by mouth See admin instructions. Pt takes 100mg  in am and 50mg  in pm 03/13/14  Yes Charolette Forward, MD  nitroGLYCERIN (NITROSTAT) 0.4 MG SL tablet Place 1 tablet (0.4 mg total) under the tongue every 5 (five) minutes x 3 doses as needed for chest pain. Patient taking differently: Place 0.4 mg under the tongue every 5 (five) minutes as needed for chest pain. Maximum 3 doses 03/13/14  Yes Charolette Forward, MD  pantoprazole (PROTONIX) 40 MG tablet Take 40 mg by mouth  2 (two) times daily.    Yes Historical Provider, MD  pregabalin (LYRICA) 75 MG capsule Take 75-150 mg by mouth 2 (two) times daily. Take 1 tablet (75 mg) every morning and 2 tablets (150 mg) at bedtime   Yes Historical Provider, MD  tiZANidine (ZANAFLEX) 4 MG capsule Take 4 mg by mouth every 8 (eight) hours as needed for muscle spasms.  01/29/15  Yes Historical Provider, MD  topiramate (TOPAMAX) 100 MG tablet Take 100 mg by mouth 2 (two) times daily. 01/28/15  Yes Historical Provider, MD  cyclobenzaprine (FLEXERIL) 10 MG tablet Take 1 tablet (10 mg total) by mouth 2 (two) times daily as needed for muscle spasms. Patient not taking: Reported on 06/08/2015 04/02/15   Delsa Grana, PA-C  HYDROcodone-acetaminophen (NORCO/VICODIN) 5-325 MG per tablet Take 1 tablet by mouth every 6 (six) hours as needed for moderate pain. Patient not taking: Reported on 02/11/2015 10/09/14   Dalia Heading, PA-C  hydrOXYzine (ATARAX/VISTARIL) 25 MG tablet Take 1 tablet (25 mg total) by mouth every 6 (six) hours. For itching Patient not taking: Reported on 06/08/2015 03/14/15   Billy Fischer, MD  lisinopril (PRINIVIL) 10 MG tablet Take 1 tablet (10 mg total) by mouth daily. Patient not taking: Reported on 02/11/2015 08/01/14   Verlee Monte, MD  methylPREDNISolone (MEDROL DOSEPAK) 4 MG TBPK tablet follow package directions Patient not taking: Reported on 06/08/2015 03/14/15   Billy Fischer, MD  predniSONE (DELTASONE) 20 MG tablet Take 2 tablets (40 mg total) by mouth daily. Patient not taking: Reported on 06/08/2015 05/26/15   Montine Circle, PA-C   BP 159/95 mmHg  Pulse 87  Temp(Src) 98 F (36.7 C) (Oral)  Resp 16  Ht 4\' 11"  (1.499 m)  Wt 273 lb (123.832 kg)  BMI 55.11 kg/m2  SpO2 98% Physical Exam  Constitutional: She is oriented to person, place, and time. She appears well-developed and well-nourished. No distress.  HENT:  Head: Normocephalic.  Eyes: Conjunctivae are normal.  Neck: Neck supple. No tracheal deviation  present.  Cardiovascular: Normal rate and regular rhythm.   Pulmonary/Chest: Effort normal. No respiratory distress.  Abdominal: Soft. She exhibits no distension.  Neurological: She is alert and oriented to person, place, and time. She has normal strength. Coordination and gait normal. GCS eye subscore is 4. GCS verbal subscore is 5. GCS motor subscore is 6.  Left lower facial droop, left lower eyelid lower that right, forehead spared, inconsistent sensory exam.  Skin: Skin is warm and dry.  Psychiatric: She has a normal mood and affect.    ED Course  Procedures (including critical care time) Labs Review Labs Reviewed  CBC - Abnormal; Notable for the following:    WBC 11.5 (*)    RDW 15.7 (*)    All other components within normal limits  DIFFERENTIAL -  Abnormal; Notable for the following:    Lymphs Abs 4.1 (*)    All other components within normal limits  COMPREHENSIVE METABOLIC PANEL - Abnormal; Notable for the following:    Glucose, Bld 104 (*)    All other components within normal limits  I-STAT CHEM 8, ED - Abnormal; Notable for the following:    Glucose, Bld 100 (*)    Hemoglobin 16.7 (*)    HCT 49.0 (*)    All other components within normal limits  PROTIME-INR  APTT  I-STAT TROPOININ, ED  CBG MONITORING, ED    Imaging Review Ct Head Wo Contrast  06/08/2015   CLINICAL DATA:  Left sided headache and numbness.  Code stroke.  EXAM: CT HEAD WITHOUT CONTRAST  TECHNIQUE: Contiguous axial images were obtained from the base of the skull through the vertex without contrast.  COMPARISON:  07/31/2014 and 07/30/2014  FINDINGS: Normal appearance of the intracranial structures. No evidence for acute hemorrhage, mass lesion, midline shift, hydrocephalus or large infarct. No acute bony abnormality. The visualized sinuses are clear.  IMPRESSION: Negative head CT.   Electronically Signed   By: Markus Daft M.D.   On: 06/08/2015 13:21   Mr Brain Wo Contrast  06/08/2015   CLINICAL DATA:   54 year old female awoke at 0530 hours today with left facial numbness. Headache. Left facial droop. Initial encounter.  EXAM: MRI HEAD WITHOUT CONTRAST  TECHNIQUE: Multiplanar, multiecho pulse sequences of the brain and surrounding structures were obtained without intravenous contrast.  COMPARISON:  Head CT without contrast 1311 hours today. Brain MRI 07/31/2014, and earlier.  FINDINGS: No restricted diffusion or evidence of acute infarction. Major intracranial vascular flow voids are stable.  No midline shift, mass effect, evidence of mass lesion, ventriculomegaly, extra-axial collection or acute intracranial hemorrhage. Cervicomedullary junction and pituitary are stable. Pearline Cables and white matter signal is stable since 2015, mild nonspecific T2 and FLAIR hyperintensity primarily in the pons. No cortical encephalomalacia or chronic cerebral blood products.  Mastoids and paranasal sinuses remain clear. Orbit and scalp soft tissues are stable.  IMPRESSION: No acute intracranial abnormality.  Stable noncontrast MRI appearance of the brain since 2015, with nonspecific signal changes primarily in the pons.   Electronically Signed   By: Genevie Ann M.D.   On: 06/08/2015 15:36   I have personally reviewed and evaluated these images and lab results as part of my medical decision-making.   EKG Interpretation   Date/Time:  Friday June 08 2015 13:21:03 EDT Ventricular Rate:  97 PR Interval:  148 QRS Duration: 81 QT Interval:  370 QTC Calculation: 470 R Axis:   21 Text Interpretation:  Sinus rhythm Normal ECG Confirmed by Bernarda Erck MD,  Quillian Quince ([id number]) on 06/08/2015 1:29:41 PM      MDM   Final diagnoses:  Other complicated headache syndrome  Facial paresthesia    54 year old female presents with headache that she woke up with this morning, left-sided facial numbness and lower face drooping. No speech, arm, leg difficulty. On arrival she has conflicting neuro findings so code stroke was initiated as patient is  endorsing facial asymmetry compared to her normal appearance with forehead sparing. Neurology saw patient in consultation for code stroke and is concerned for symptoms were consistent with complicated migraine. They recommended MRI for definitive evaluation and discharge was symptomatically care if negative.  Care transferred to Dr. Billy Fischer at 4 PM pending results of MRI and discharge if negative or admit for further stroke workup with ischemic findings. Pt improved. Discharged after  negative MR with plan for OP follow up.   Leo Grosser, MD 06/09/15 [phone]

## 2015-06-08 NOTE — ED Notes (Signed)
Pt states at 0530 started to have left sided HA and left sided facial numbness. Left sided facial droop noted in triage, no weakness to arms or legs at this time. Pt states hx of stroke in 2000 with no deficits from that. States takes a baby asprin a day.

## 2015-06-08 NOTE — ED Notes (Addendum)
Dr. Laneta Simmers at triage, notified secretary to call [facility]

## 2015-06-08 NOTE — Discharge Instructions (Signed)

## 2015-06-08 NOTE — ED Notes (Signed)
Patient transported to CT from triage by RN as a code stroke

## 2015-06-08 NOTE — ED Notes (Signed)
Pt CBG, 87. Nurse was notified. 

## 2015-06-08 NOTE — Consult Note (Signed)
Referring Physician: Laneta Simmers    Chief Complaint: code stroke  HPI:                                                                                                                                         Jodi Bowman is an 54 y.o. female with known stroke risk factors and migraine HA. Patient was last known normal prior to going to bed at 2200 hours on 06-07-2015.  She awoke this AM 0530 and noted left facial numbness.  EMS was called to house due to left facial droop and left sided numbness. Due to LSN being out of window for both IA and tPA Code stroke was canceled.  While in ED she continued to complain of 9/10 HA with left facial numbness and left sided numbness. She states she usually gets Migraine HA to which she takes Topamax and followed by [facility].  It is unlike her to get neurological symptoms with her HA. CT head showed no acute stroke or bleed.    Date last known well: Date: 06/07/2015 Time last known well: Time: 22:00 tPA Given: No: out of window     Past Medical History  Diagnosis Date  . Hypertension   . Hypercholesteremia   . Lower back pain   . Nontoxic uninodular goiter     sees dr vollmer at [facility]  . Calculus of gallbladder without mention of cholecystitis or obstruction   . Arthritis   . Stroke 2000  . Migraine   . Sleep apnea     STOPBANG=5  . Fibromyalgia   . Obesity     Past Surgical History  Procedure Laterality Date  . Knee arthroscopy  one 1995 and 1 in 1997    both knees done  . Abdominal hysterectomy    . Cesarean section  yrs ago    done x 2  . Surgery for endometriosis  yrs ago  . Thryoid biopsy  December 01, 2011     at [facility]  . Cholecystectomy  01/05/2012    Procedure: LAPAROSCOPIC CHOLECYSTECTOMY WITH INTRAOPERATIVE CHOLANGIOGRAM;  Surgeon: Pedro Earls, MD;  Location: [facility];  Service: General;  Laterality: N/A;  . Colonoscopy  10/08/2012    Procedure: COLONOSCOPY;  Surgeon: Beryle Beams, MD;  Location: [facility];   Service: Endoscopy;  Laterality: N/A;    Family History  Problem Relation Age of Onset  . Cancer Brother     colon and lung  . Cancer Maternal Grandmother     colon   Social History:  reports that she has never smoked. She has never used smokeless tobacco. She reports that she does not drink alcohol or use illicit drugs.  Allergies:  Allergies  Allergen Reactions  . Shrimp [Shellfish Allergy] Anaphylaxis  . Naproxen Other (See Comments)    Makes stomach cramp and burning badly.    Medications:  No current facility-administered medications for this encounter.   Current Outpatient Prescriptions  Medication Sig Dispense Refill  . amLODipine (NORVASC) 10 MG tablet Take 10 mg by mouth daily.  3  . aspirin EC 81 MG EC tablet Take 1 tablet (81 mg total) by mouth daily. 30 tablet 3  . cholecalciferol (VITAMIN D) 1000 UNITS tablet Take 1,000 Units by mouth daily.    . cyclobenzaprine (FLEXERIL) 10 MG tablet Take 1 tablet (10 mg total) by mouth 2 (two) times daily as needed for muscle spasms. 20 tablet 0  . DULoxetine (CYMBALTA) 60 MG capsule Take 60 mg by mouth daily.     . fluticasone (FLONASE) 50 MCG/ACT nasal spray Place 2 sprays into both nostrils 2 (two) times daily.     . furosemide (LASIX) 20 MG tablet Take 20 mg by mouth daily.  0  . HYDROcodone-acetaminophen (NORCO/VICODIN) 5-325 MG per tablet Take 1 tablet by mouth every 6 (six) hours as needed for moderate pain. (Patient not taking: Reported on 02/11/2015) 15 tablet 0  . hydrOXYzine (ATARAX/VISTARIL) 25 MG tablet Take 1 tablet (25 mg total) by mouth every 6 (six) hours. For itching 30 tablet 0  . lisinopril (PRINIVIL) 10 MG tablet Take 1 tablet (10 mg total) by mouth daily. (Patient not taking: Reported on 02/11/2015) 30 tablet 0  . lisinopril-hydrochlorothiazide (PRINZIDE,ZESTORETIC) 20-25 MG per tablet Take 1  tablet by mouth daily.  0  . loratadine (CLARITIN) 10 MG tablet Take 10 mg by mouth daily.     . methylPREDNISolone (MEDROL DOSEPAK) 4 MG TBPK tablet follow package directions 21 tablet 0  . metoprolol succinate (TOPROL-XL) 100 MG 24 hr tablet Take 1 tablet (100 mg total) by mouth daily. Take with or immediately following a meal. (Patient not taking: Reported on 02/11/2015) 30 tablet 3  . metoprolol succinate (TOPROL-XL) 50 MG 24 hr tablet Take 50 mg by mouth at bedtime. Take with or immediately following a meal.    . nitroGLYCERIN (NITROSTAT) 0.4 MG SL tablet Place 1 tablet (0.4 mg total) under the tongue every 5 (five) minutes x 3 doses as needed for chest pain. (Patient taking differently: Place 0.4 mg under the tongue every 5 (five) minutes as needed for chest pain. Maximum 3 doses) 25 tablet 12  . Oxycodone HCl 10 MG TABS Take 10 mg by mouth 3 (three) times daily as needed (pain).     . pantoprazole (PROTONIX) 40 MG tablet Take 40 mg by mouth 2 (two) times daily.     . predniSONE (DELTASONE) 20 MG tablet Take 2 tablets (40 mg total) by mouth daily. 10 tablet 0  . pregabalin (LYRICA) 75 MG capsule Take 75-150 mg by mouth 2 (two) times daily. Take 1 tablet (75 mg) every morning and 2 tablets (150 mg) at bedtime    . tiZANidine (ZANAFLEX) 4 MG capsule Take 4 mg by mouth every 8 (eight) hours as needed for muscle spasms.   3  . topiramate (TOPAMAX) 100 MG tablet Take 100 mg by mouth 2 (two) times daily.  3  . [DISCONTINUED] metoprolol (LOPRESSOR) 50 MG tablet Take 50 mg by mouth 2 (two) times daily.      . [DISCONTINUED] simvastatin (ZOCOR) 20 MG tablet Take 20 mg by mouth at bedtime.         ROS:  History obtained from the patient  General ROS: negative for - chills, fatigue, fever, night sweats, weight gain or weight loss Psychological ROS: negative for -  behavioral disorder, hallucinations, memory difficulties, mood swings or suicidal ideation Ophthalmic ROS: negative for - blurry vision, double vision, eye pain or loss of vision ENT ROS: negative for - epistaxis, nasal discharge, oral lesions, sore throat, tinnitus or vertigo Allergy and Immunology ROS: negative for - hives or itchy/watery eyes Hematological and Lymphatic ROS: negative for - bleeding problems, bruising or swollen lymph nodes Endocrine ROS: negative for - galactorrhea, hair pattern changes, polydipsia/polyuria or temperature intolerance Respiratory ROS: negative for - cough, hemoptysis, shortness of breath or wheezing Cardiovascular ROS: negative for - chest pain, dyspnea on exertion, edema or irregular heartbeat Gastrointestinal ROS: negative for - abdominal pain, diarrhea, hematemesis, nausea/vomiting or stool incontinence Genito-Urinary ROS: negative for - dysuria, hematuria, incontinence or urinary frequency/urgency Musculoskeletal ROS: negative for - joint swelling or muscular weakness Neurological ROS: as noted in HPI Dermatological ROS: negative for rash and skin lesion changes  Neurologic Examination:                                                                                                      Blood pressure 144/94, pulse 102, temperature 98 F (36.7 C), temperature source Oral, resp. rate 16, height 4\' 11"  (1.499 m), weight 123.832 kg (273 lb), SpO2 100 %.  HEENT-  Normocephalic, no lesions, without obvious abnormality.  Normal external eye and conjunctiva.  Normal TM's bilaterally.  Normal auditory canals and external ears. Normal external nose, mucus membranes and septum.  Normal pharynx. Cardiovascular- S1, S2 normal, pulses palpable throughout   Lungs- chest clear, no wheezing, rales, normal symmetric air entry Abdomen- normal findings: bowel sounds normal Extremities- no joint deformities, effusion, or inflammation Lymph-no adenopathy  palpable Musculoskeletal-no joint tenderness, deformity or swelling Skin-warm and dry, no hyperpigmentation, vitiligo, or suspicious lesions  Neurological Examination Mental Status: Alert, oriented, thought content appropriate.  Speech fluent without evidence of aphasia.  Able to follow 3 step commands without difficulty. Cranial Nerves: II: Discs flat bilaterally; Visual fields grossly normal, pupils equal, round, reactive to light and accommodation III,IV, VI: ptosis not present, extra-ocular motions intact bilaterally V,VII: smile symmetric, facial light touch sensation decreased on the left--splits midline to tuning fork VIII: hearing normal bilaterally IX,X: uvula rises symmetrically XI: bilateral shoulder shrug XII: midline tongue extension Motor: Right : Upper extremity   5/5    Left:     Upper extremity   5/5  Lower extremity   5/5     Lower extremity   5/5 Tone and bulk:normal tone throughout; no atrophy noted Sensory: Pinprick and light touch decreased on the left splitting midline in trunk Deep Tendon Reflexes: 2+ and symmetric throughout Plantars: Right: downgoing   Left: downgoing Cerebellar: normal finger-to-nose, normal heel-to-shin test Gait: not tested due to acuity       Lab Results: Basic Metabolic Panel: No results for input(s): NA, K, CL, CO2, GLUCOSE, BUN, CREATININE, CALCIUM, MG, PHOS in the last 168 hours.  Liver Function  Tests: No results for input(s): AST, ALT, ALKPHOS, BILITOT, PROT, ALBUMIN in the last 168 hours. No results for input(s): LIPASE, AMYLASE in the last 168 hours. No results for input(s): AMMONIA in the last 168 hours.  CBC: No results for input(s): WBC, NEUTROABS, HGB, HCT, MCV, PLT in the last 168 hours.  Cardiac Enzymes: No results for input(s): CKTOTAL, CKMB, CKMBINDEX, TROPONINI in the last 168 hours.  Lipid Panel: No results for input(s): CHOL, TRIG, HDL, CHOLHDL, VLDL, LDLCALC in the last 168 hours.  CBG: No results for  input(s): GLUCAP in the last 168 hours.  Microbiology: Results for orders placed or performed during the hospital encounter of 12/25/11  Surgical pcr screen     Status: None   Collection Time: 12/25/11  2:23 PM  Result Value Ref Range Status   MRSA, PCR NEGATIVE NEGATIVE Final   Staphylococcus aureus NEGATIVE NEGATIVE Final    Comment:        The Xpert SA Assay (FDA approved for NASAL specimens only), is one component of a comprehensive surveillance program.  It is not intended to diagnose infection nor to guide or monitor treatment.    Coagulation Studies: No results for input(s): LABPROT, INR in the last 72 hours.  Imaging: No results found.     Assessment and plan discussed with with attending physician and they are in agreement.    Etta Quill PA-C [facility] [phone]  06/08/2015, 1:16 PM   Assessment: 54 y.o. female presenting with HA and left sided decreased sensation. Exam does have some functional components.  Differential includes complex migraine HA versus small vessel CVA.   Stroke Risk Factors - hyperlipidemia and hypertension  Recommend: 1) MRI brain--if negative for stroke would not further stroke work up. If positive would continue with stroke work up 2) Treat HA symptomatically.

## 2015-07-01 IMAGING — CT CT HEAD W/O CM
2 series · 16 of 30 positions shown, 18 images · non-contrast
Comparison: 10/12/2013

CLINICAL DATA: Syncopal episode with loss of consciousness. History
of prior CVA with left-sided weakness.

EXAM:
CT HEAD WITHOUT CONTRAST
TECHNIQUE: Contiguous axial images were obtained from the base of the skull
through the vertex without intravenous contrast.

[Series 201: head w/o, idose (1) · axial · non-contrast · 0.42mm/px · z∈[+60,+175]mm · 8 of 31 slices shown, 10 images]
[im 4/31  brain]
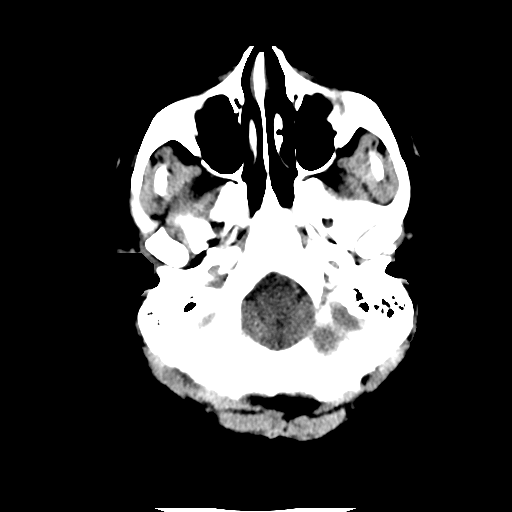
[im 4/31  bone]
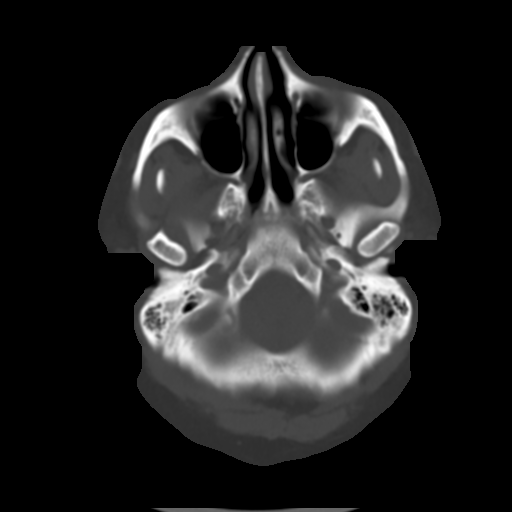
[im 7/31  brain]
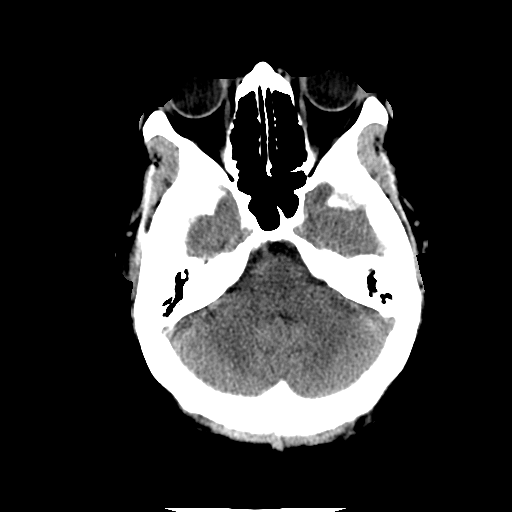
[im 11/31  brain]
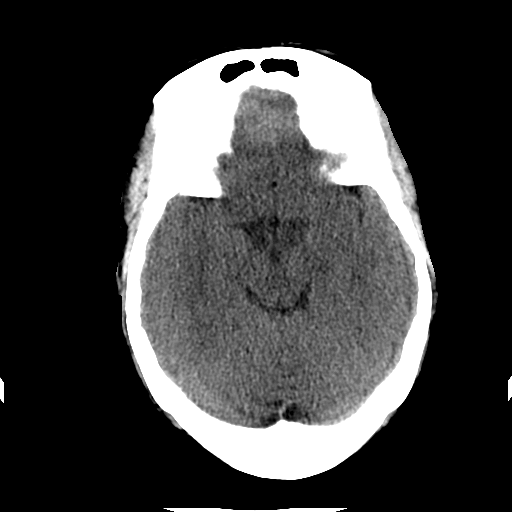
[im 14/31  brain]
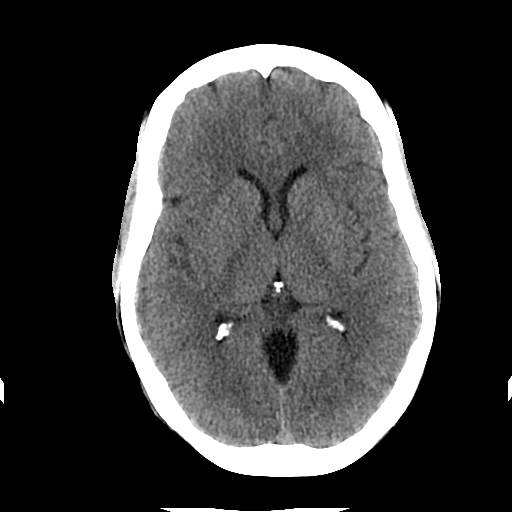
[im 17/31  brain]
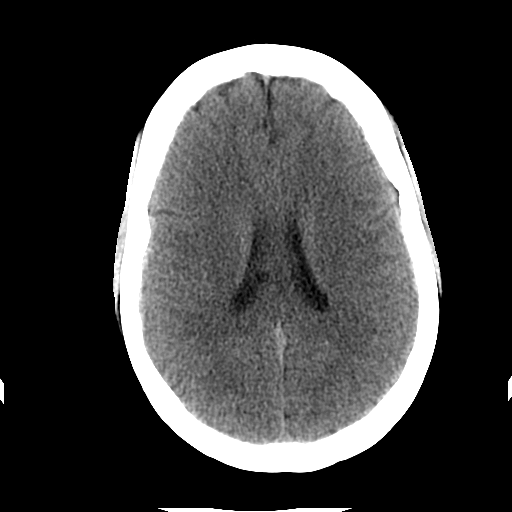
[im 17/31  bone]
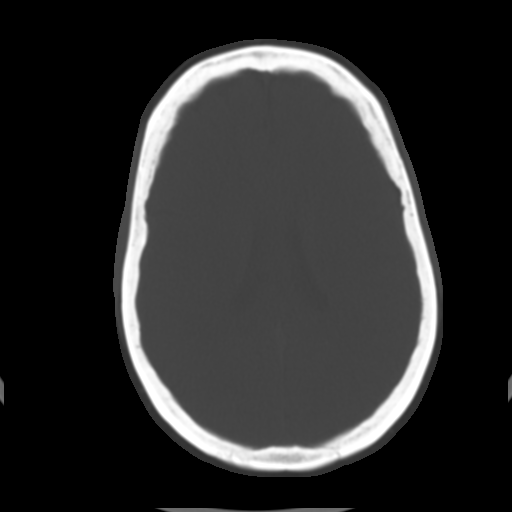
[im 21/31  brain]
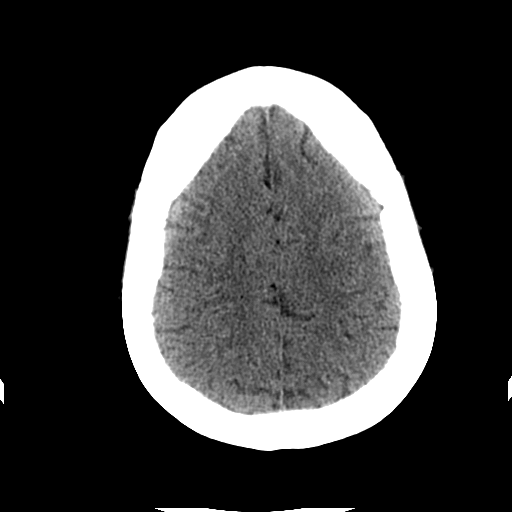
[im 24/31  brain]
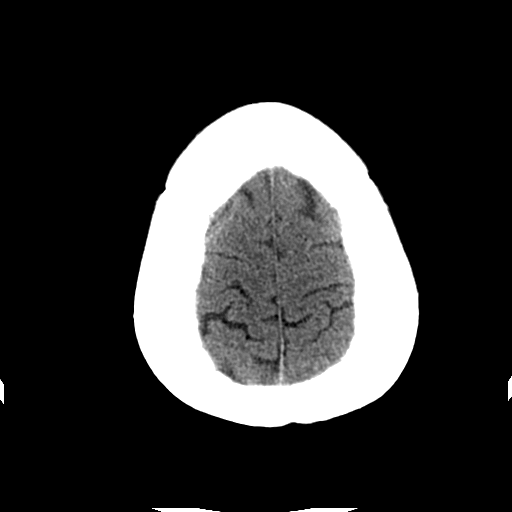
[im 27/31  brain]
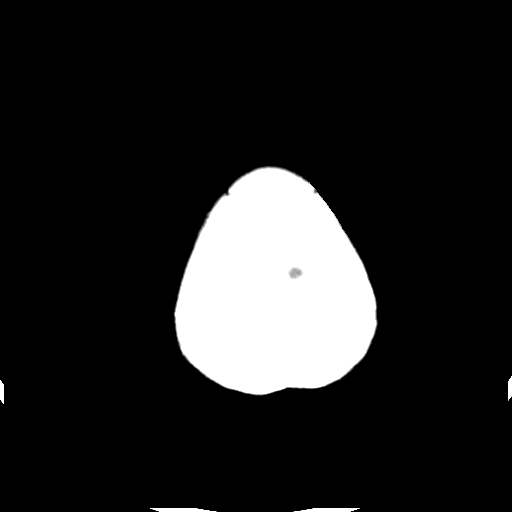

[Series 202: head w/o bone, idose (1) · axial · non-contrast · 0.42mm/px · z∈[+59,+171]mm · 8 of 62 slices shown]
[im 7/62  bone]
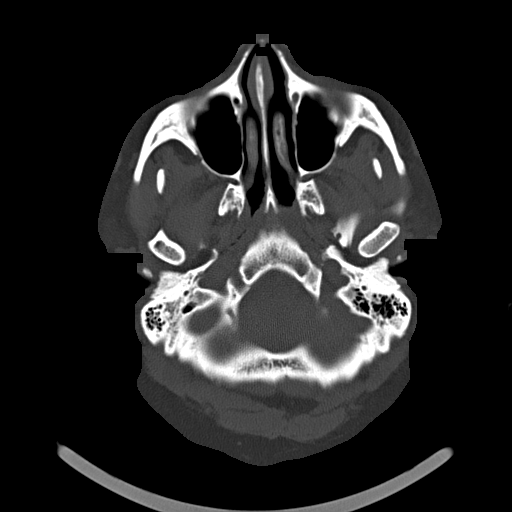
[im 13/62  bone]
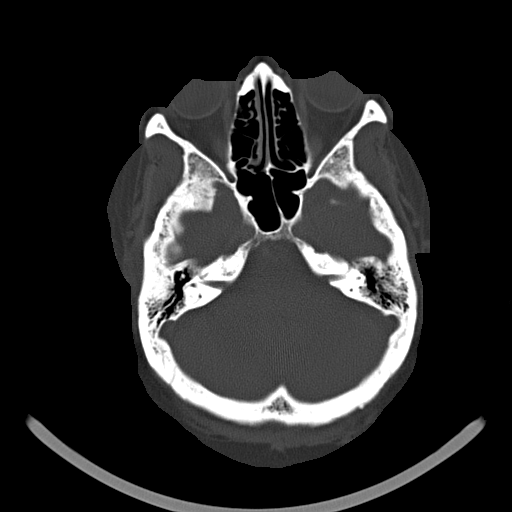
[im 20/62  bone]
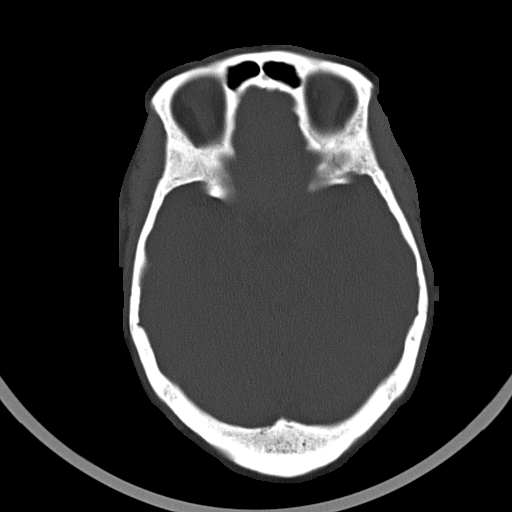
[im 26/62  bone]
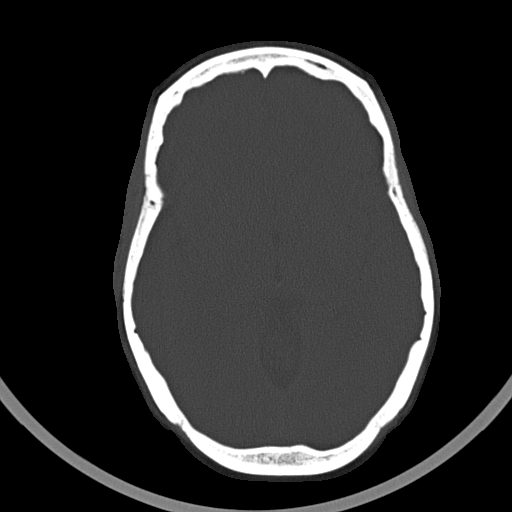
[im 33/62  bone]
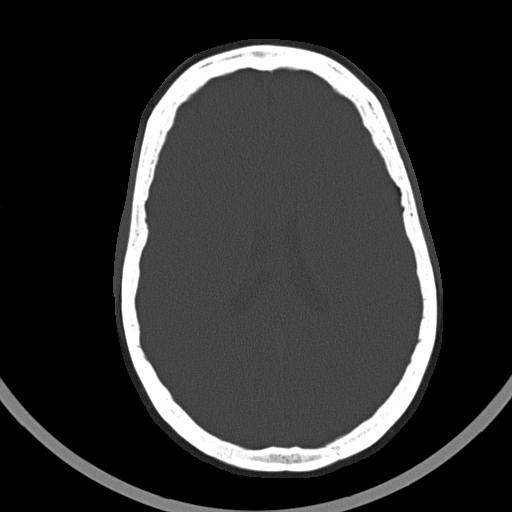
[im 39/62  bone]
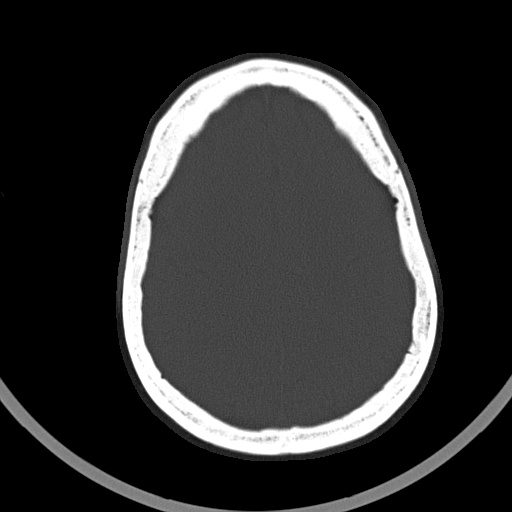
[im 45/62  bone]
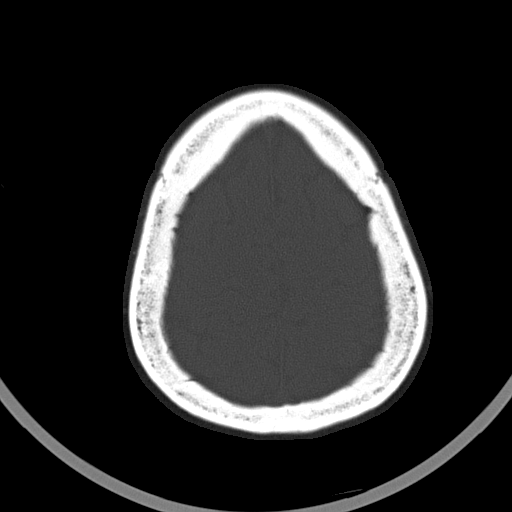
[im 52/62  bone]
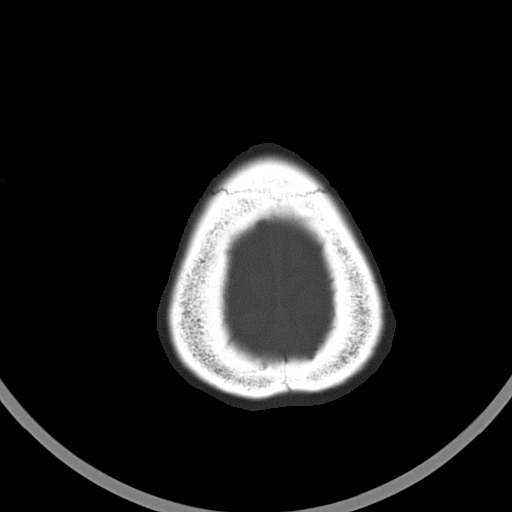

[16 of 30 positions shown; findings below may reference images not displayed]

FINDINGS: The brain demonstrates no evidence of hemorrhage, infarction, edema,
mass effect, extra-axial fluid collection, hydrocephalus or mass
lesion. The skull is unremarkable.
IMPRESSION: Normal head CT.

## 2015-07-02 IMAGING — MR MR LUMBAR SPINE W/O CM
4 of 5 series · 19 of 48 positions shown · non-contrast
Comparison: Abdominal radiographs 01/04/2012

CLINICAL DATA: Syncopal episode with fall, collapsing at home. Low
back pain and left lower extremity weakness.

EXAM:
MRI LUMBAR SPINE WITHOUT CONTRAST
TECHNIQUE: Multiplanar, multisequence MR imaging of the lumbar spine was
performed. No intravenous contrast was administered.

[Series 3: T2 · sagittal · 4.0mm · 0.55mm/px · 4 of 12 slices shown (1 of 2)]
[im 1/12]
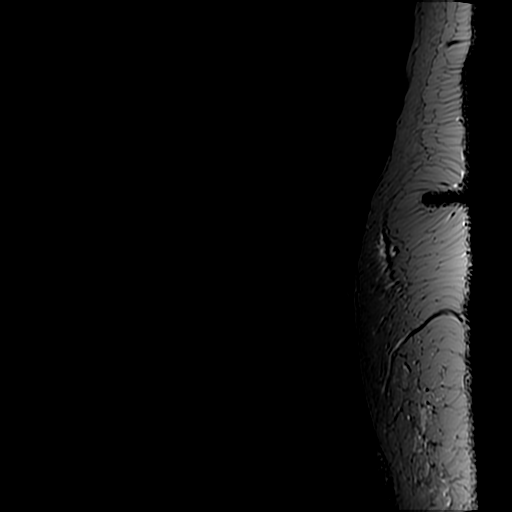
[im 4/12]
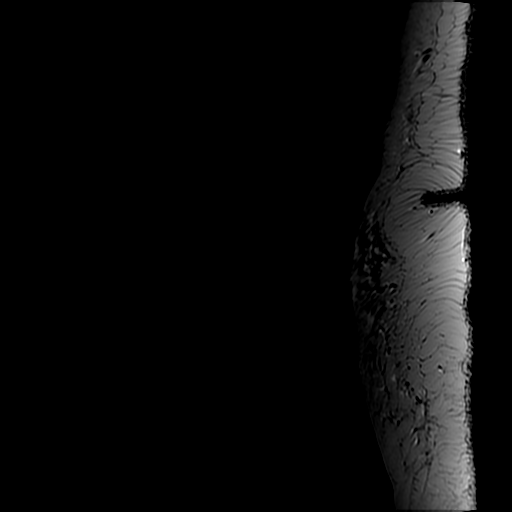
[im 8/12]
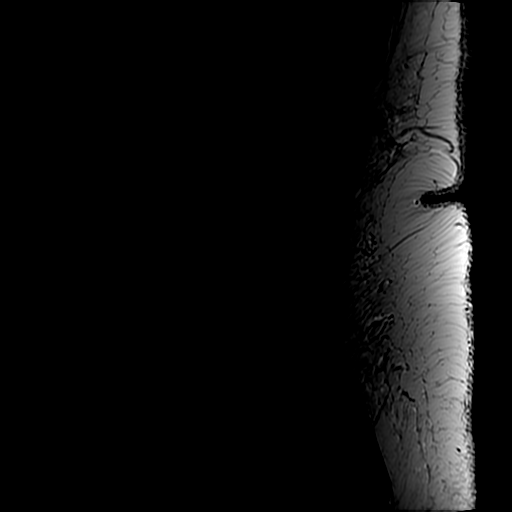
[im 12/12]
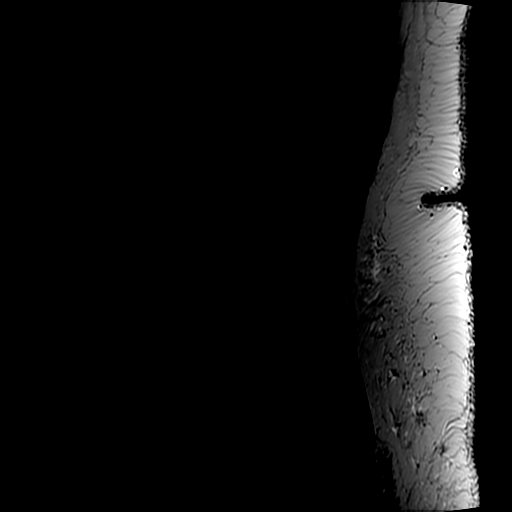

[Series 4: STIR · sagittal · 4.0mm · 0.55mm/px · 3 of 12 slices shown]
[im 1/12]
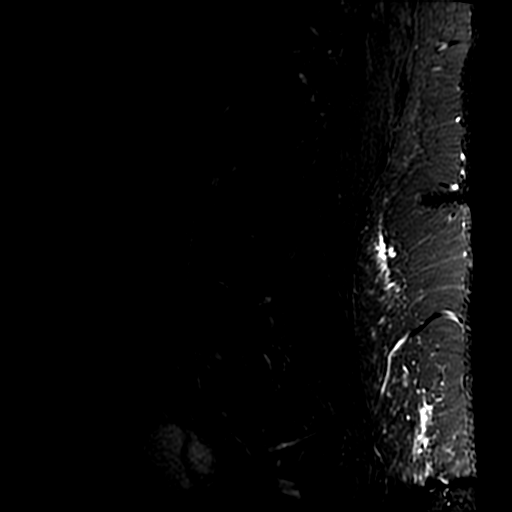
[im 6/12]
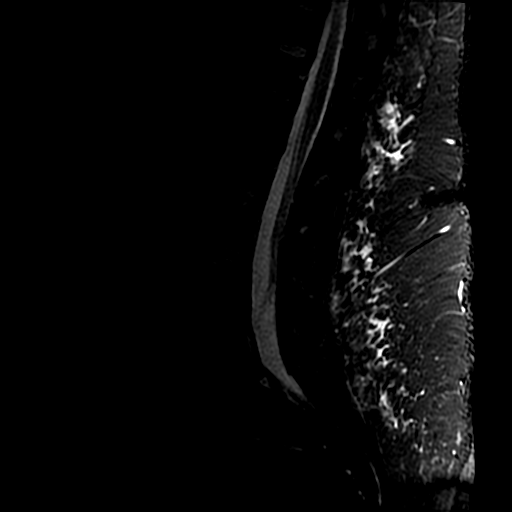
[im 12/12]
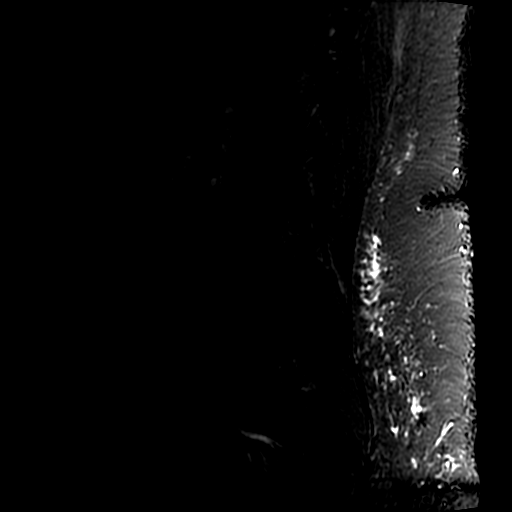

[Series 6: T2 · axial · 4.0mm · 0.39mm/px · z∈[+65,+229]mm · 9 of 42 slices shown (2 of 2)]
[im 3/42]
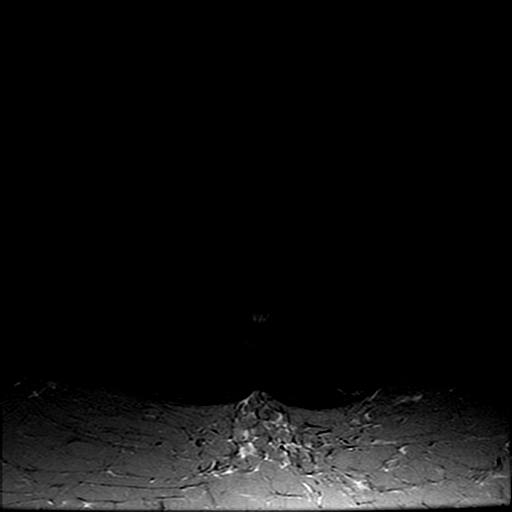
[im 6/42]
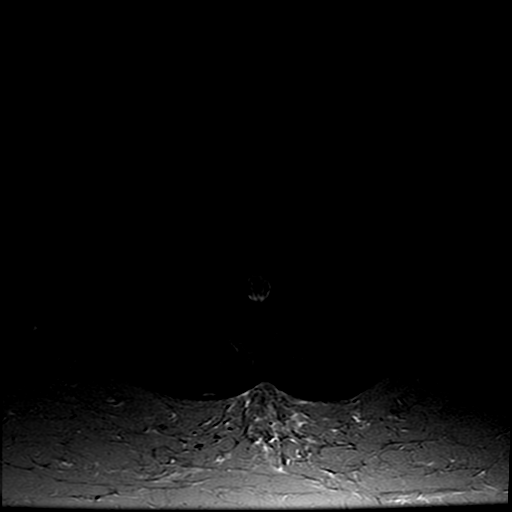
[im 8/42]
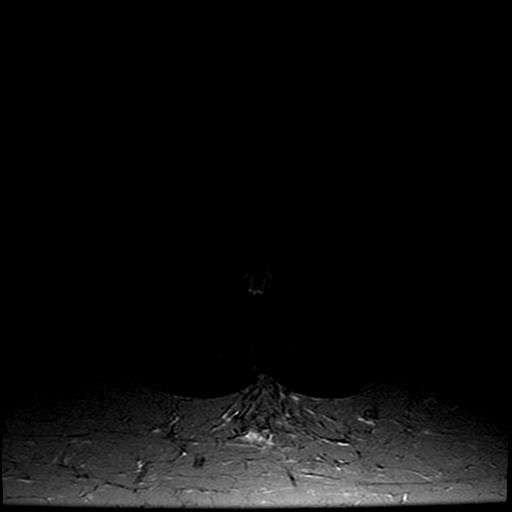
[im 13/42]
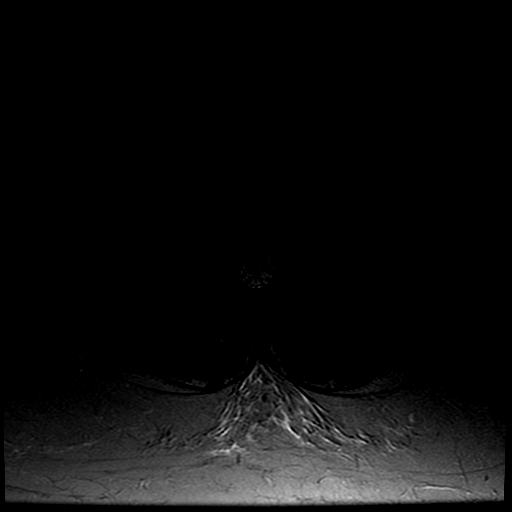
[im 18/42]
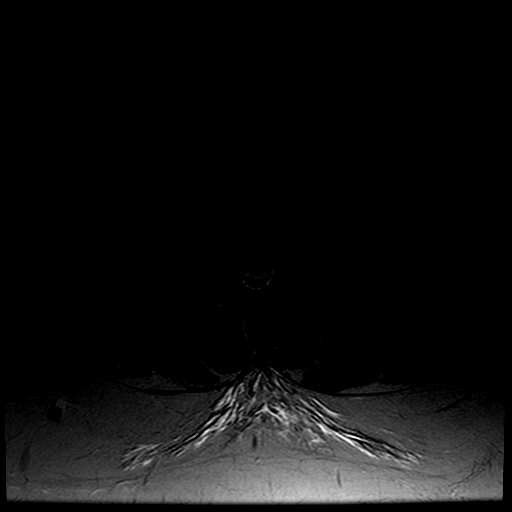
[im 21/42]
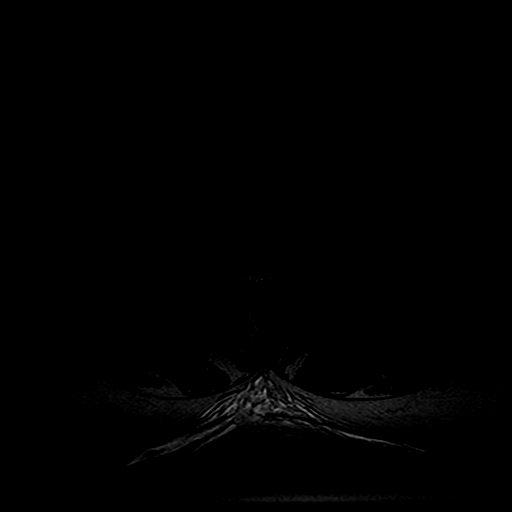
[im 24/42]
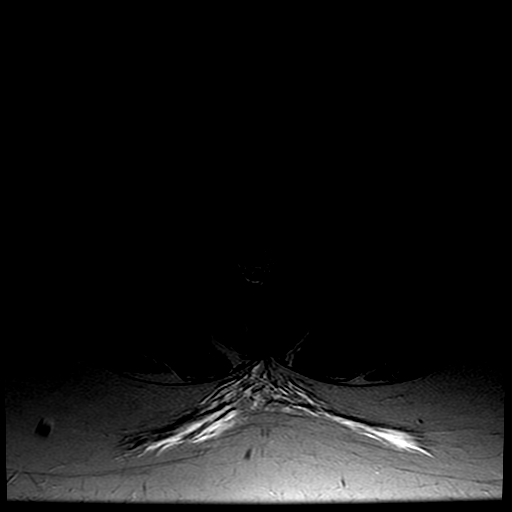
[im 29/42]
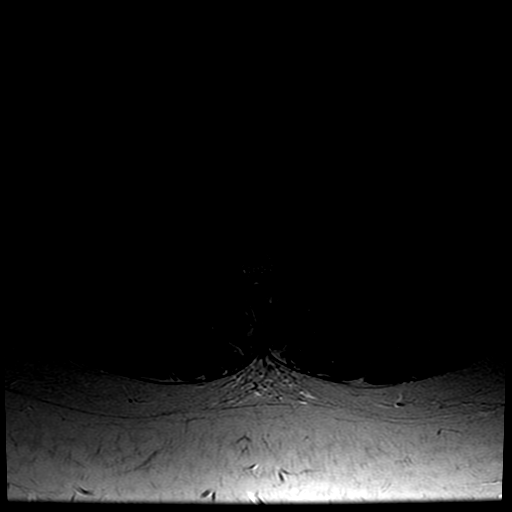
[im 36/42]
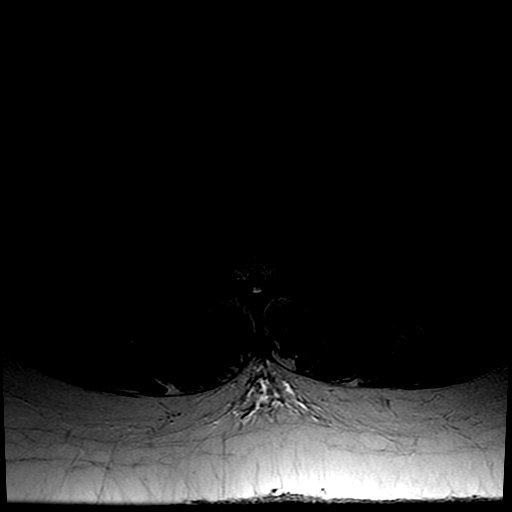

[Series 7: T1 · axial · 4.0mm · 0.39mm/px · z∈[+77,+229]mm · 3 of 42 slices shown]
[im 6/42]
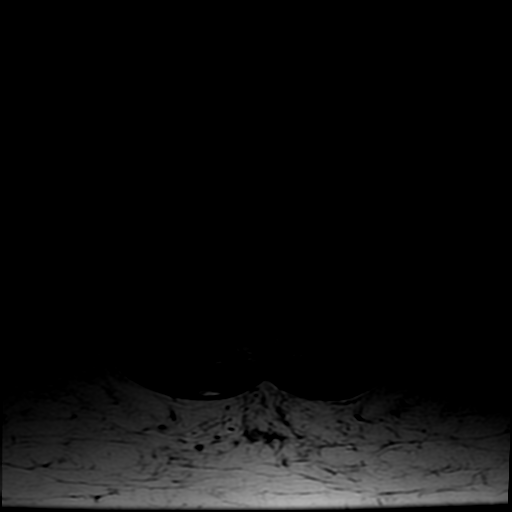
[im 21/42]
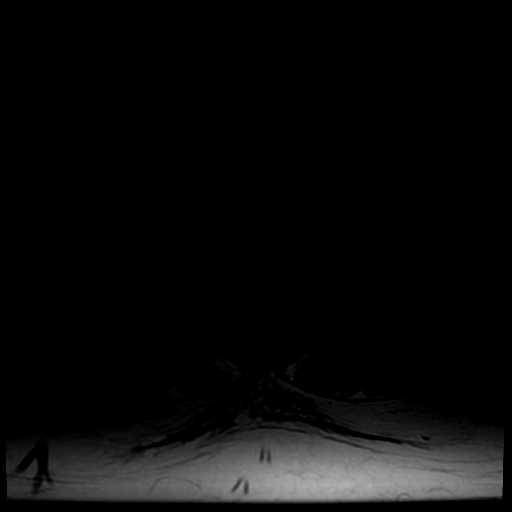
[im 36/42]
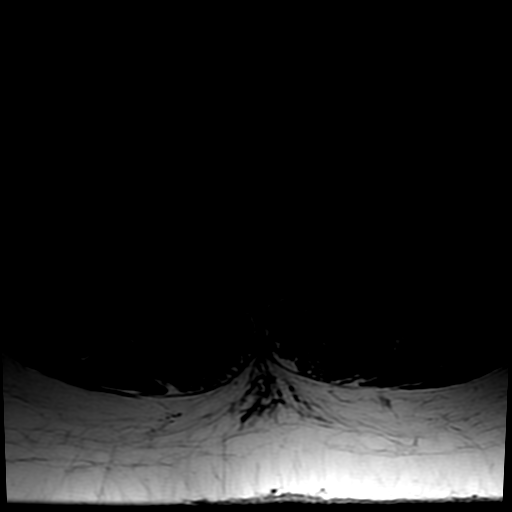

[19 of 48 positions shown; findings below may reference images not displayed]

FINDINGS: For the purposes of this dictation, the lowest well-formed
intervertebral disc space is presumed to be L5-S1.

Images are mildly degraded by motion artifact. Vertebral alignment
is normal. Vertebral body heights are preserved. Disc desiccation is
present at L4-5 and L5-S1 without significant disc space height
loss. There is no evidence of vertebral marrow edema. Conus
medullaris is normal in signal and terminates at the superior aspect
of L2. 1.7 cm T2 hyperintense focus in the left kidney likely
represents a cyst.

L1-2: Minimal disc bulging and minimal facet hypertrophy without
stenosis.

L2-3:  Negative.

L3-4: Minimal disc bulging and minimal facet hypertrophy without
stenosis.

L4-5: Minimal disc bulging and mild facet hypertrophy without
stenosis.

L5-S1: Small central disc protrusion contributes to mild bilateral
lateral recess narrowing without evidence of impingement of the S1
nerve roots or spinal stenosis. Mild circumferential disc bulging
and mild-to-moderate facet hypertrophy result in mild-to-moderate
bilateral neural foraminal stenosis.
IMPRESSION: Mild lumbar spondylosis, greatest at L5-S1 where there is mild
lateral recess and mild-to-moderate neural foraminal stenosis
bilaterally. No spinal stenosis.

## 2015-07-02 IMAGING — MR MR HEAD W/O CM
9 of 11 series · 27 of 48 positions shown · non-contrast
Comparison: Head CT 07/30/2014 and MRI 10/12/2013

CLINICAL DATA: Syncope and left leg weakness.

EXAM:
MRI HEAD WITHOUT CONTRAST
TECHNIQUE: Multiplanar, multiecho pulse sequences of the brain and surrounding
structures were obtained without intravenous contrast.

[Series 2: FLAIR · sagittal · 5.0mm · 0.47mm/px · 1 of 21 slices shown (1 of 2)]
[im 1/21]
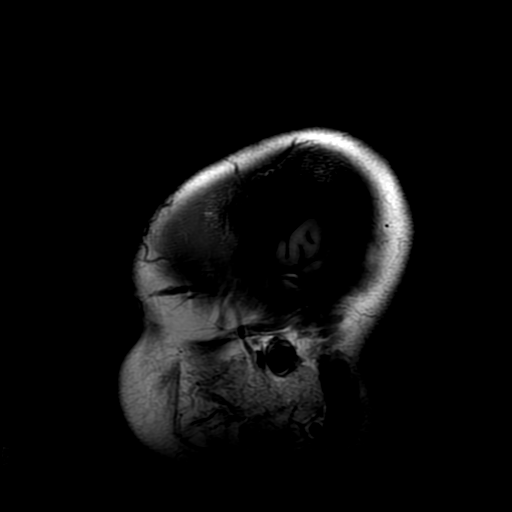

[Series 4: DWI · axial · 5.0mm · 1.02mm/px · z∈[-138,+8]mm · 4 of 62 slices shown (1 of 4)]
[im 1/62]
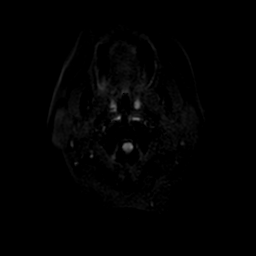
[im 21/62]
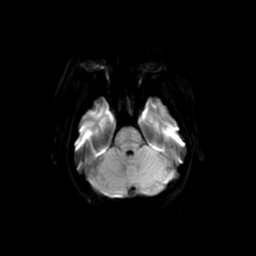
[im 41/62]
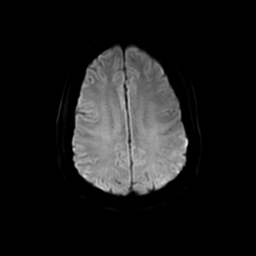
[im 62/62]
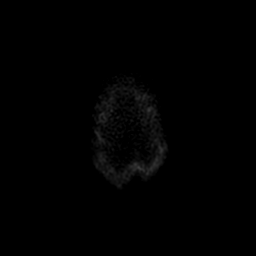

[Series 5: T2 · axial · 5.0mm · 0.43mm/px · z∈[-134,+12]mm · 2 of 26 slices shown]
[im 1/26]
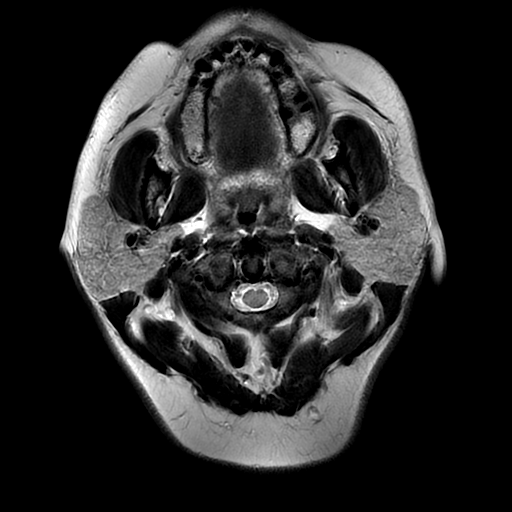
[im 26/26]
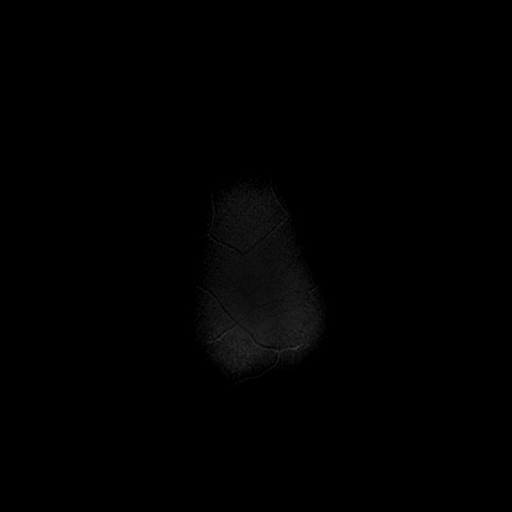

[Series 7: DWI · coronal · 5.0mm · 1.02mm/px · 4 of 66 slices shown (2 of 4)]
[im 1/66]
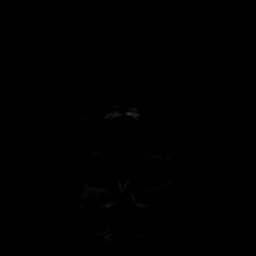
[im 22/66]
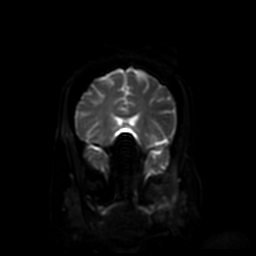
[im 44/66]
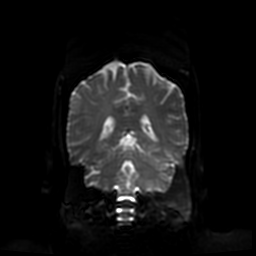
[im 66/66]
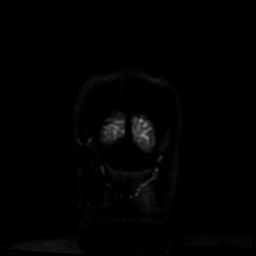

[Series 8: (person_name) · axial · 3.6mm · 0.47mm/px · z∈[-138,+15]mm · 8 of 176 slices shown]
[im 1/176]
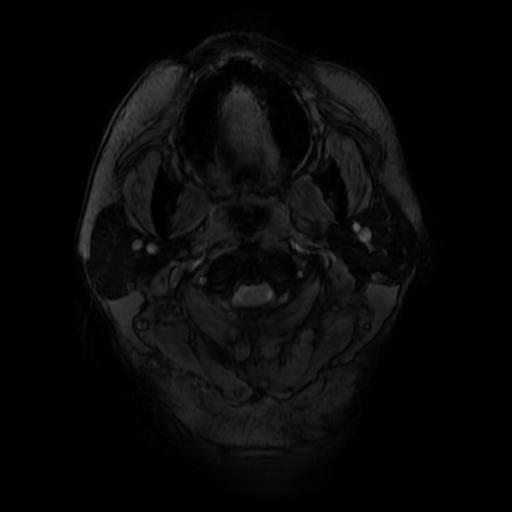
[im 32/176]
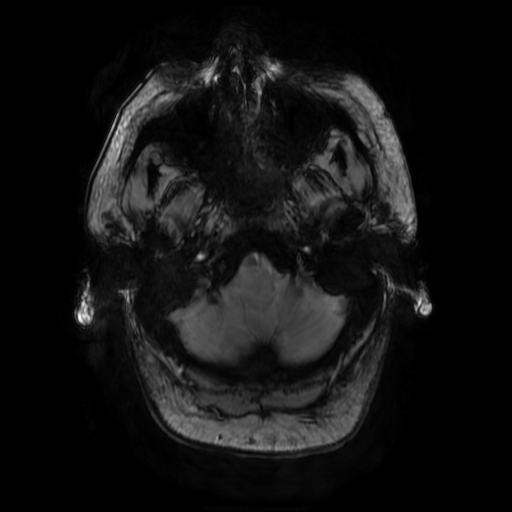
[im 48/176]
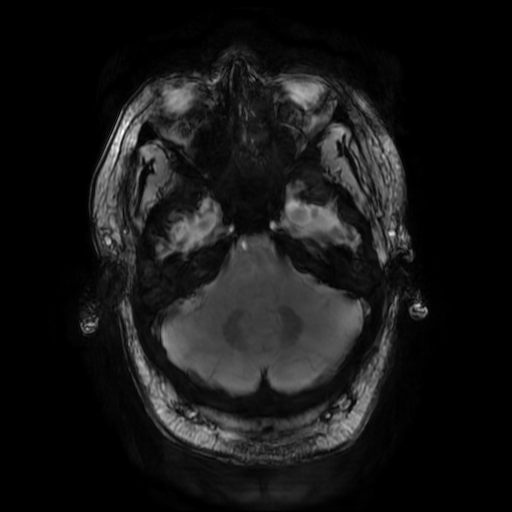
[im 80/176]
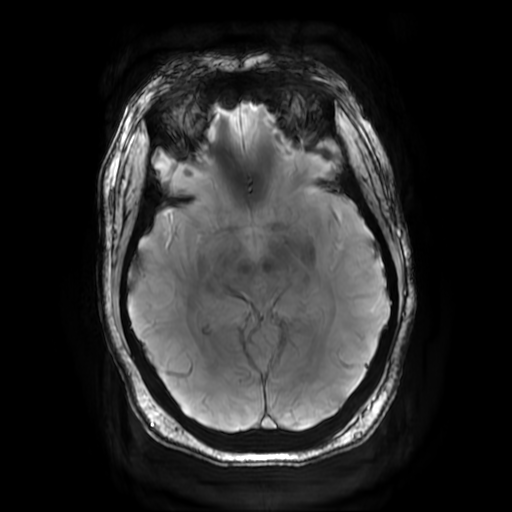
[im 96/176]
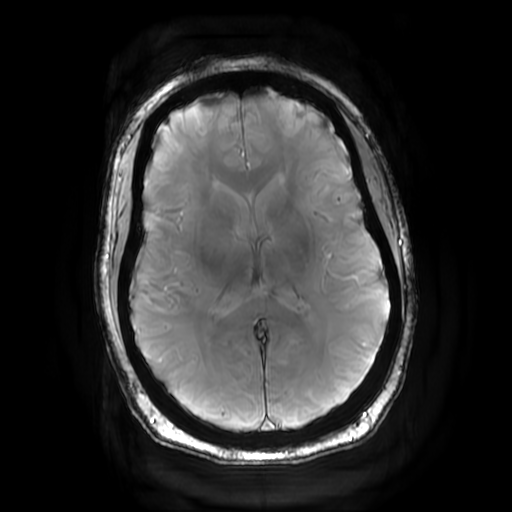
[im 128/176]
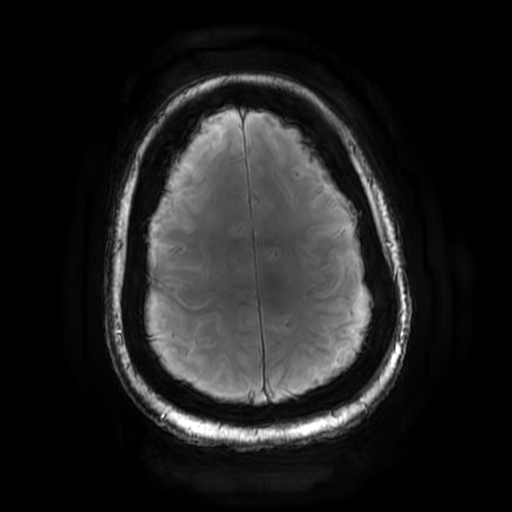
[im 144/176]
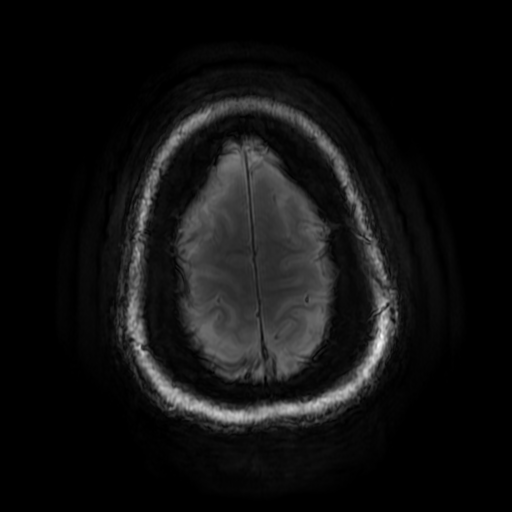
[im 176/176]
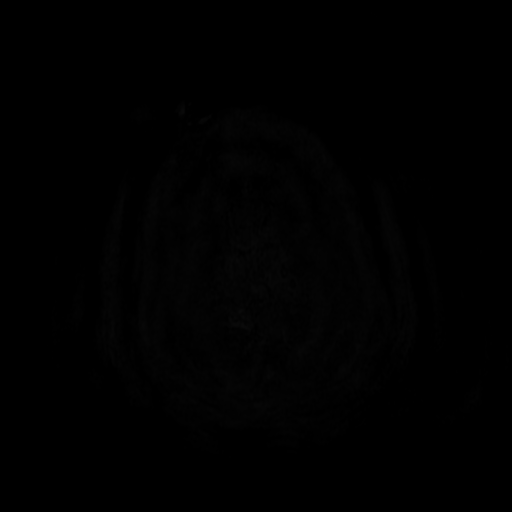

[Series 10: FLAIR · axial · 5.0mm · 0.43mm/px · z∈[-134,+12]mm · 2 of 26 slices shown (2 of 2)]
[im 1/26]
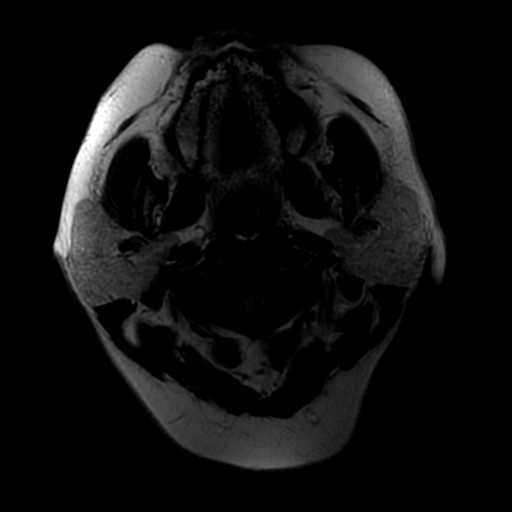
[im 26/26]
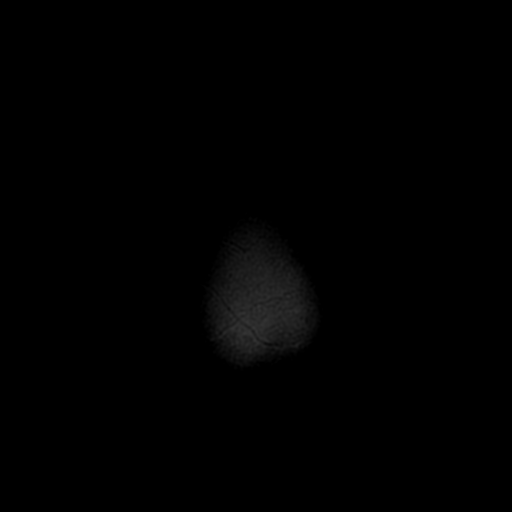

[Series 11: T2 post-contrast · coronal · 5.0mm · 0.47mm/px · 2 of 28 slices shown]
[im 1/28]
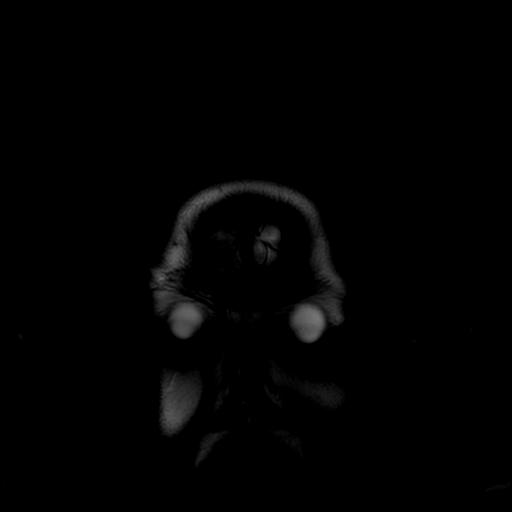
[im 28/28]
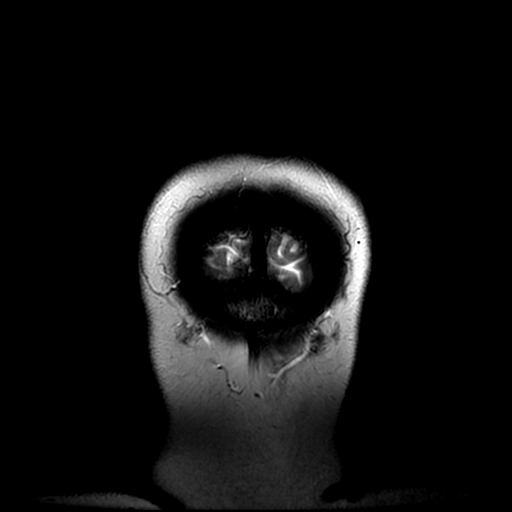

[Series 400: DWI · axial · 5.0mm · 1.02mm/px · z∈[-138,+8]mm · 2 of 31 slices shown (3 of 4)]
[im 1/31]
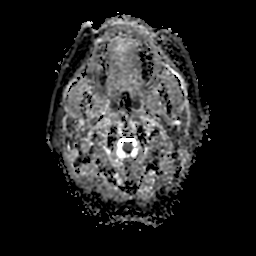
[im 31/31]
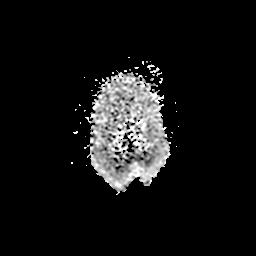

[Series 700: DWI · coronal · 5.0mm · 1.02mm/px · 2 of 32 slices shown (4 of 4)]
[im 1/32]
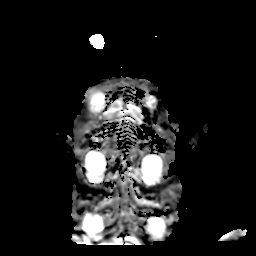
[im 32/32]
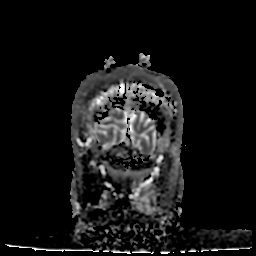

[27 of 48 positions shown; findings below may reference images not displayed]

FINDINGS: The cerebellar tonsils are again noted to extend at or minimally
below the foramen magnum, unchanged and within normal limits. There
is no evidence of acute infarct, intracranial hemorrhage, mass,
midline shift, or extra-axial fluid collection. Ventricles and sulci
are within normal limits for age. Scattered, small foci of T2
hyperintensity in the cerebral white matter and pons do not appear
significantly changed from the prior MRI and are nonspecific but
compatible with mild chronic small vessel ischemic disease.

Prior bilateral ocular lens extractions are noted. Paranasal sinuses
and mastoid air cells are clear. Major intracranial vascular flow
voids are preserved.
IMPRESSION: 1. No acute intracranial abnormality or mass.
2. Unchanged, mild chronic small vessel ischemic disease.

## 2015-08-01 ENCOUNTER — Encounter: Payer: Self-pay | Admitting: Neurology

## 2015-08-01 ENCOUNTER — Ambulatory Visit (INDEPENDENT_AMBULATORY_CARE_PROVIDER_SITE_OTHER): Payer: Medicaid Other | Admitting: Neurology

## 2015-08-01 VITALS — BP 178/85 | HR 90 | Ht 59.0 in | Wt 287.0 lb

## 2015-08-01 DIAGNOSIS — G8929 Other chronic pain: Secondary | ICD-10-CM | POA: Diagnosis not present

## 2015-08-01 DIAGNOSIS — M545 Low back pain, unspecified: Secondary | ICD-10-CM

## 2015-08-01 DIAGNOSIS — M25551 Pain in right hip: Secondary | ICD-10-CM | POA: Insufficient documentation

## 2015-08-01 DIAGNOSIS — R269 Unspecified abnormalities of gait and mobility: Secondary | ICD-10-CM | POA: Diagnosis not present

## 2015-08-01 MED ORDER — DULOXETINE HCL 30 MG PO CPEP: 90.0000 mg | ORAL_CAPSULE | Freq: Every day | ORAL | Status: DC
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
## 2015-08-02 ENCOUNTER — Emergency Department (HOSPITAL_COMMUNITY)
Admission: EM | Admit: 2015-08-02 | Discharge: 2015-08-02 | Discharge status: Against Medical Advice | Payer: Medicaid Other | Attending: Emergency Medicine | Admitting: Emergency Medicine

## 2015-08-02 ENCOUNTER — Encounter (HOSPITAL_COMMUNITY): Payer: Self-pay | Admitting: Emergency Medicine

## 2015-08-02 DIAGNOSIS — I10 Essential (primary) hypertension: Secondary | ICD-10-CM | POA: Diagnosis not present

## 2015-08-02 DIAGNOSIS — M25551 Pain in right hip: Secondary | ICD-10-CM | POA: Diagnosis not present

## 2015-08-02 DIAGNOSIS — E669 Obesity, unspecified: Secondary | ICD-10-CM | POA: Diagnosis not present

## 2015-08-02 NOTE — ED Notes (Signed)
Patient called to treatment room x2, no answer.

## 2015-08-02 NOTE — ED Notes (Addendum)
Patient c/o right hip pain, seen at neurology for back pain yesterday, patient states her pain has come on since then. Patient has taken 2 doses 5mg  Oxycodone (last at 1700), one 4mg  Zanaflex, and 75 mg Lyrica this morning. Patient denies recent fall or injury.

## 2015-08-02 NOTE — ED Notes (Signed)
Pt called for room, no answer from lobby

## 2015-08-02 NOTE — ED Notes (Signed)
Patient 3rd call to treatment room, no answer. Patient D/Cd LWBS at this time

## 2015-09-06 ENCOUNTER — Ambulatory Visit
Admission: RE | Admit: 2015-09-06 | Discharge: 2015-09-06 | Disposition: A | Discharge status: Home / Self Care | Payer: Medicaid Other | Source: Ambulatory Visit | Attending: Neurology | Admitting: Neurology

## 2015-09-06 ENCOUNTER — Telehealth: Payer: Self-pay | Admitting: Neurology

## 2015-09-06 DIAGNOSIS — M545 Low back pain, unspecified: Secondary | ICD-10-CM

## 2015-09-06 DIAGNOSIS — M25551 Pain in right hip: Secondary | ICD-10-CM

## 2015-09-06 DIAGNOSIS — R269 Unspecified abnormalities of gait and mobility: Secondary | ICD-10-CM

## 2015-09-06 DIAGNOSIS — G8929 Other chronic pain: Secondary | ICD-10-CM

## 2015-09-06 NOTE — Telephone Encounter (Signed)
Spoke to patient - aware of results. 

## 2015-09-06 NOTE — Telephone Encounter (Signed)
Please call patient, x-ray of cervical, thoracic, lumbar spine showed degenerative joint disease, no acute abnormalities, X-ray of right hip no acute abnormality

## 2015-09-10 IMAGING — CR DG CHEST 2V
2 series · 2 of 2 positions shown · non-contrast
Comparison: 03/12/2014

CLINICAL DATA: Productive cough.  Fibromyalgia.  Hypertension.

EXAM:
CHEST  2 VIEW

[w chest pa]
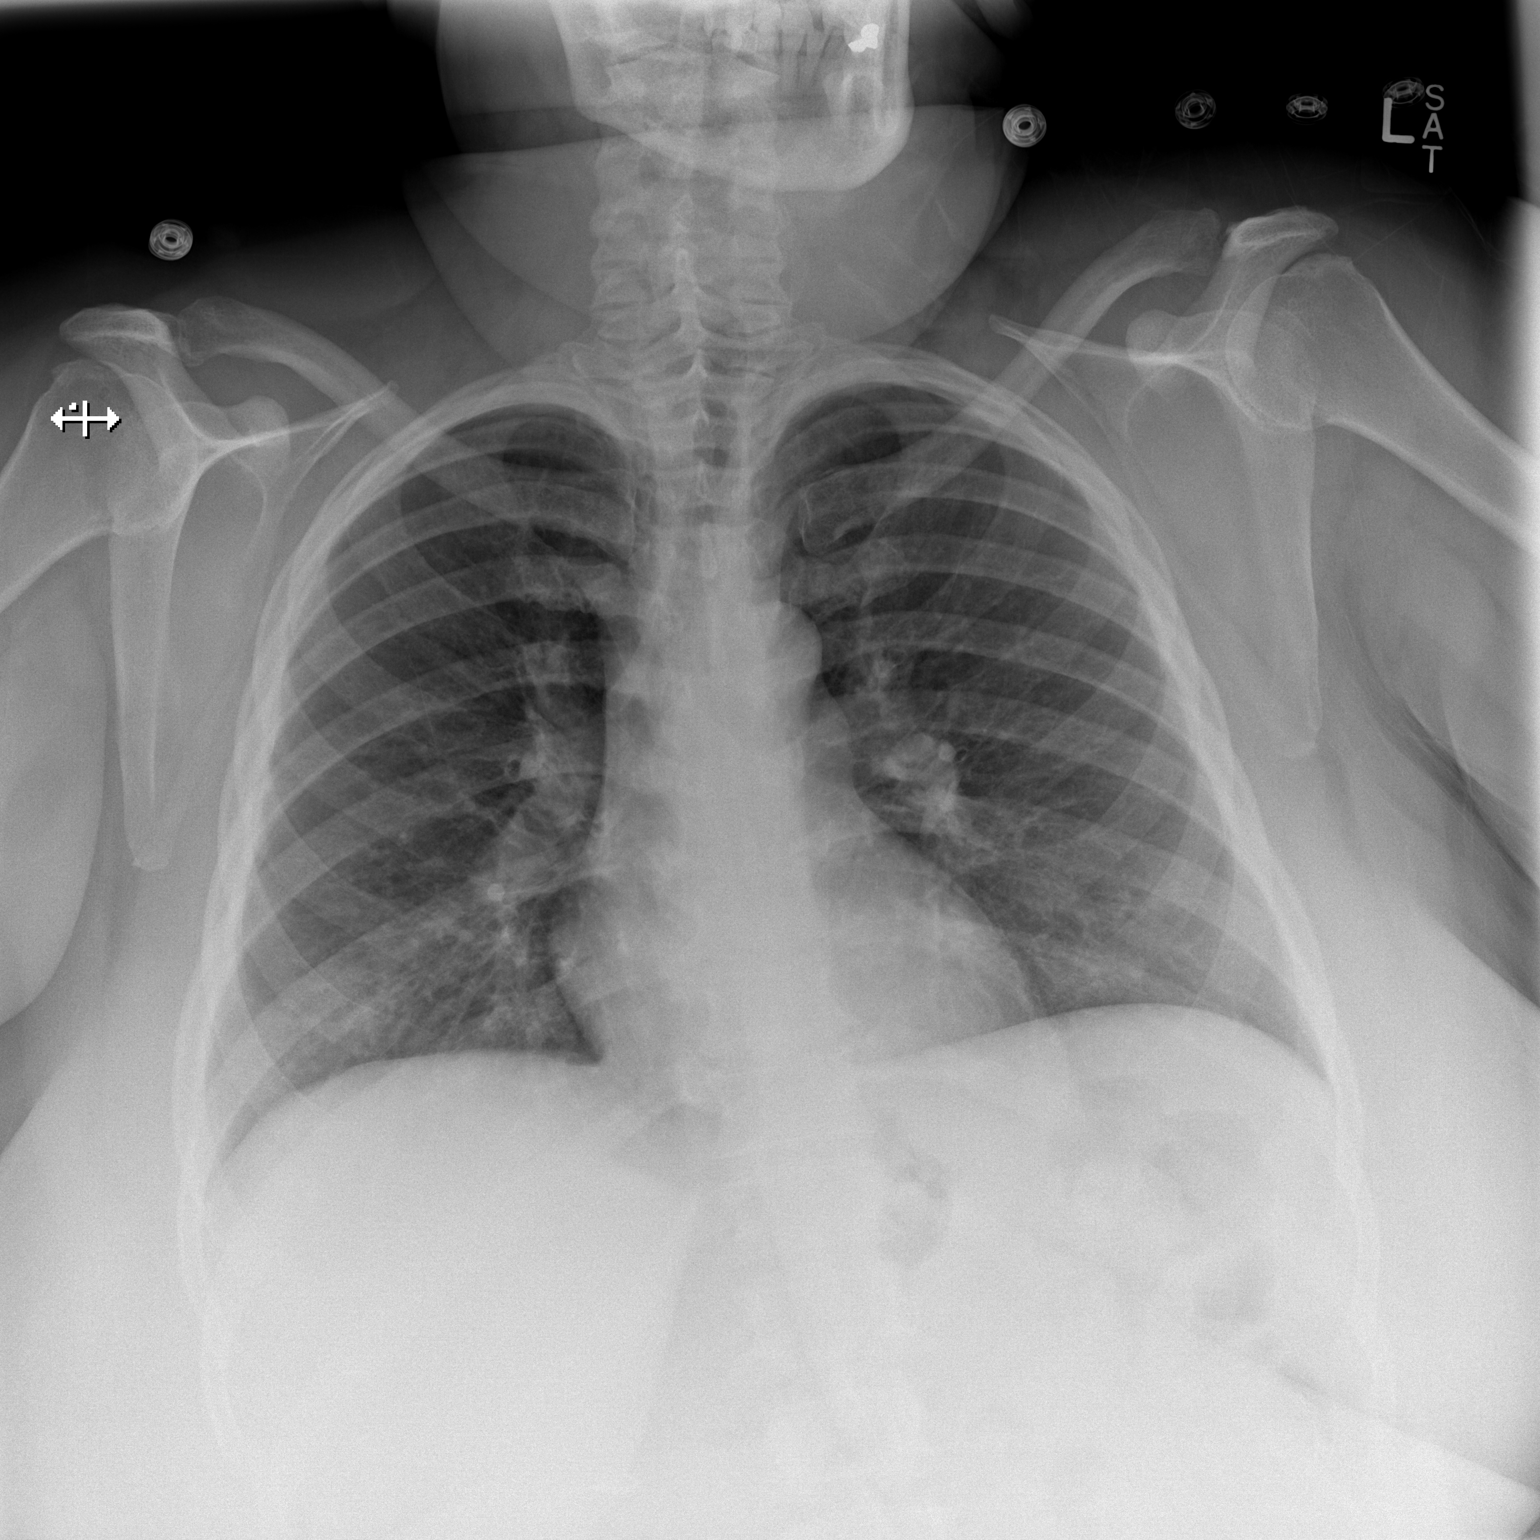

[w chest lat]
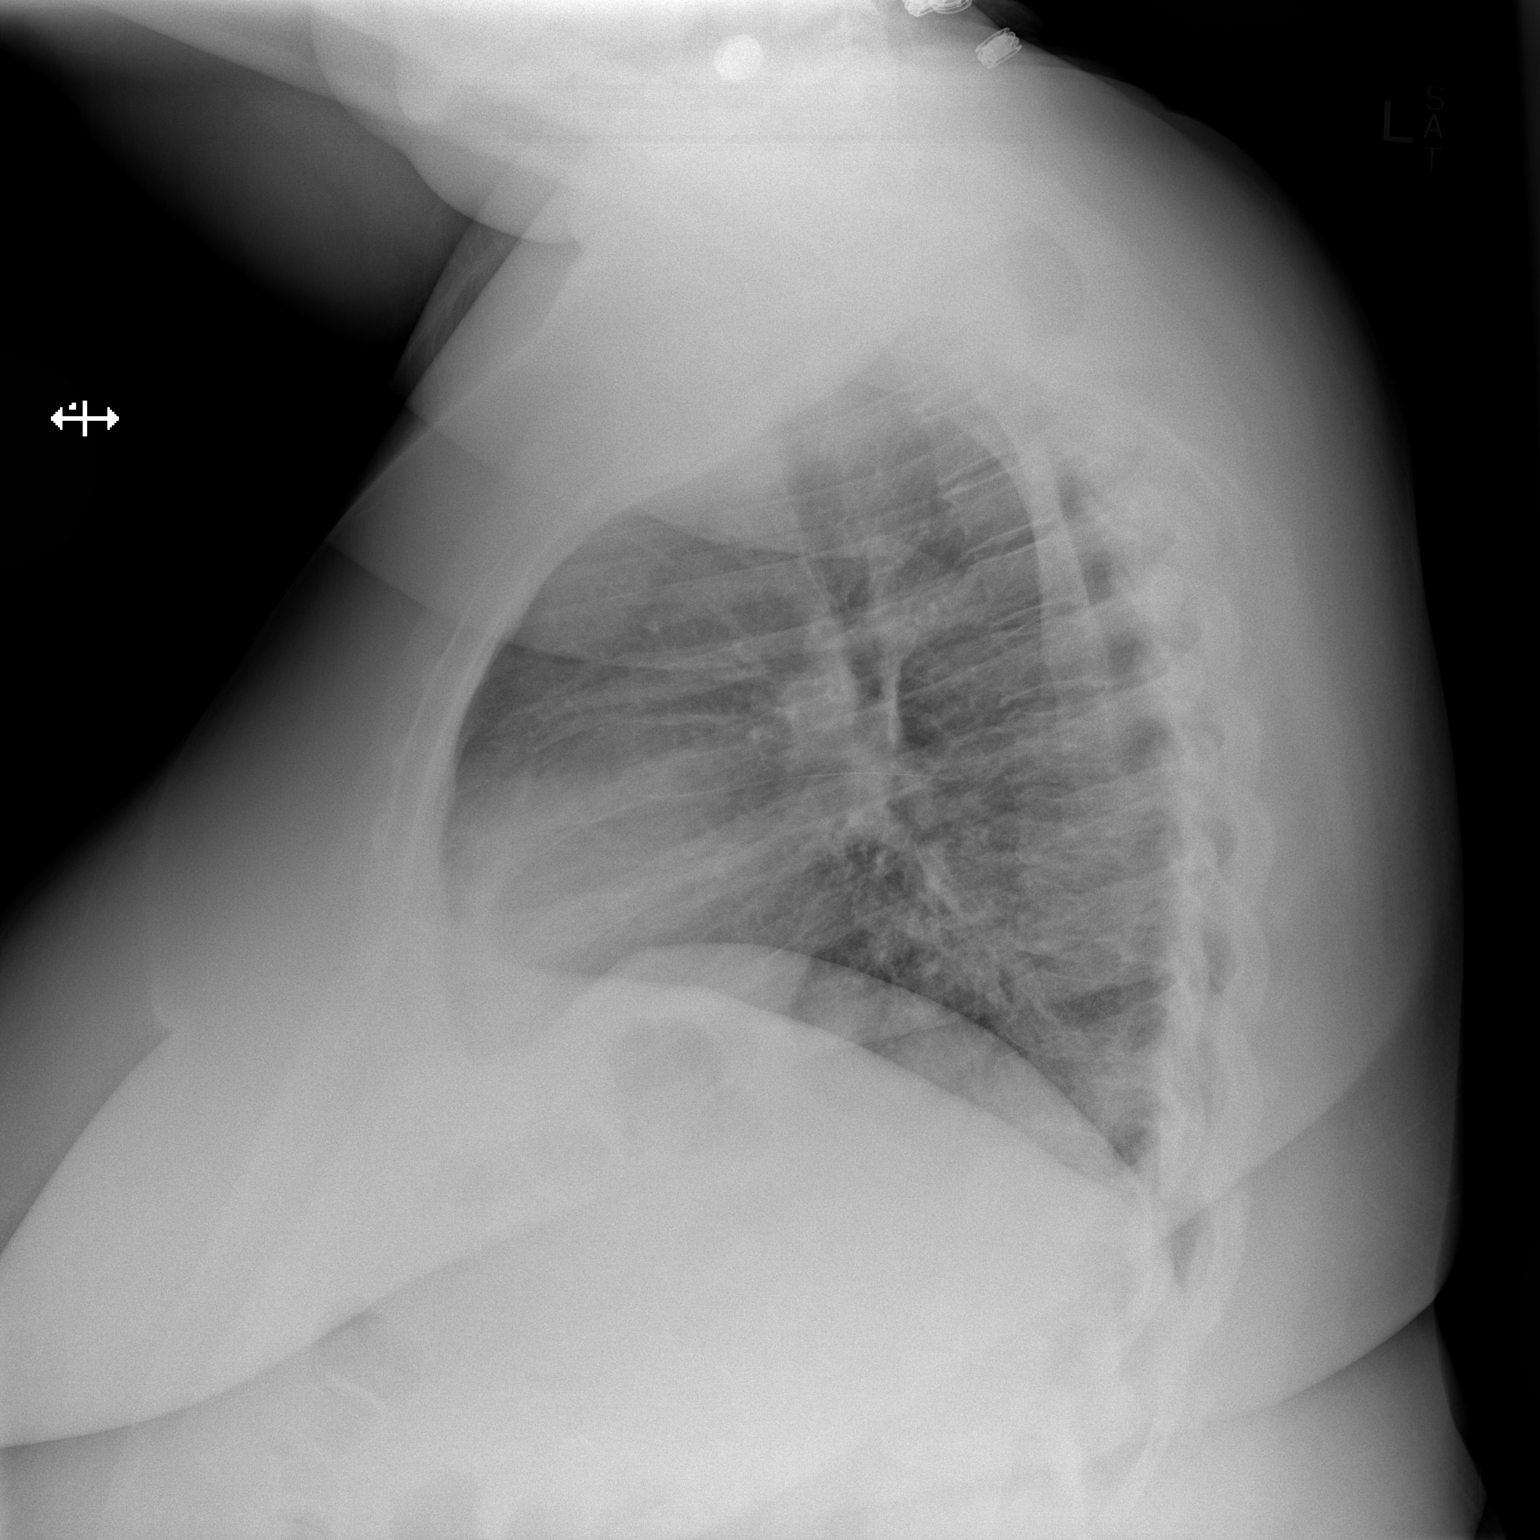

[2 of 2 positions shown; findings below may reference images not displayed]

FINDINGS: Low lung volumes are noted. Central peribronchial thickening seen.
No evidence of pulmonary airspace disease or pleural effusion. Heart
size is normal. No mass or lymphadenopathy identified.
IMPRESSION: Low lung volumes and central peribronchial thickening. No focal
airspace disease or consolidation.

## 2015-10-11 ENCOUNTER — Emergency Department (HOSPITAL_COMMUNITY)
Admission: EM | Admit: 2015-10-11 | Discharge: 2015-10-11 | Discharge status: Against Medical Advice | Payer: Medicaid Other

## 2015-10-14 ENCOUNTER — Emergency Department (HOSPITAL_COMMUNITY)
Admission: EM | Admit: 2015-10-14 | Discharge: 2015-10-14 | Disposition: A | Discharge status: Home / Self Care | Payer: Medicaid Other | Attending: Emergency Medicine | Admitting: Emergency Medicine

## 2015-10-14 ENCOUNTER — Encounter (HOSPITAL_COMMUNITY): Payer: Self-pay | Admitting: *Deleted

## 2015-10-14 DIAGNOSIS — E669 Obesity, unspecified: Secondary | ICD-10-CM | POA: Insufficient documentation

## 2015-10-14 DIAGNOSIS — M545 Low back pain, unspecified: Secondary | ICD-10-CM

## 2015-10-14 DIAGNOSIS — Z8673 Personal history of transient ischemic attack (TIA), and cerebral infarction without residual deficits: Secondary | ICD-10-CM | POA: Diagnosis not present

## 2015-10-14 DIAGNOSIS — M791 Myalgia, unspecified site: Secondary | ICD-10-CM

## 2015-10-14 DIAGNOSIS — M1990 Unspecified osteoarthritis, unspecified site: Secondary | ICD-10-CM | POA: Insufficient documentation

## 2015-10-14 DIAGNOSIS — Z7952 Long term (current) use of systemic steroids: Secondary | ICD-10-CM | POA: Diagnosis not present

## 2015-10-14 DIAGNOSIS — Z79899 Other long term (current) drug therapy: Secondary | ICD-10-CM | POA: Diagnosis not present

## 2015-10-14 DIAGNOSIS — I10 Essential (primary) hypertension: Secondary | ICD-10-CM

## 2015-10-14 DIAGNOSIS — E782 Mixed hyperlipidemia: Secondary | ICD-10-CM | POA: Diagnosis not present

## 2015-10-14 LAB — URINALYSIS, ROUTINE W REFLEX MICROSCOPIC
Bilirubin Urine: NEGATIVE
Glucose, UA: NEGATIVE mg/dL
Hgb urine dipstick: NEGATIVE
Ketones, ur: NEGATIVE mg/dL
Leukocytes, UA: NEGATIVE
Nitrite: NEGATIVE
Protein, ur: NEGATIVE mg/dL
Specific Gravity, Urine: 1.029 (ref 1.005–1.030)
pH: 5.5 (ref 5.0–8.0)

## 2015-10-14 LAB — URINE MICROSCOPIC-ADD ON

## 2015-10-14 MED ORDER — HYDROMORPHONE HCL 1 MG/ML IJ SOLN
1.0000 mg | Freq: Once | INTRAMUSCULAR | Status: AC
  Administered 2015-10-14: 1 mg via INTRAMUSCULAR
  Filled 2015-10-14: qty 1

## 2015-10-14 MED ORDER — CYCLOBENZAPRINE HCL 10 MG PO TABS: 10.0000 mg | ORAL_TABLET | Freq: Two times a day (BID) | ORAL | Status: DC | PRN

## 2015-10-14 MED ORDER — ONDANSETRON 4 MG PO TBDP
8.0000 mg | ORAL_TABLET | Freq: Once | ORAL | Status: AC
  Administered 2015-10-14: 8 mg via ORAL
  Filled 2015-10-14: qty 2

## 2015-10-14 MED ORDER — BUPIVACAINE HCL (PF) 0.25 % IJ SOLN
30.0000 mL | Freq: Once | INTRAMUSCULAR | Status: AC
  Administered 2015-10-14: 30 mL
  Filled 2015-10-14: qty 30

## 2015-10-14 MED ORDER — FENTANYL CITRATE (PF) 100 MCG/2ML IJ SOLN
50.0000 ug | Freq: Once | INTRAMUSCULAR | Status: AC
  Administered 2015-10-14: 50 ug via NASAL

## 2015-10-14 MED ORDER — FENTANYL CITRATE (PF) 100 MCG/2ML IJ SOLN
INTRAMUSCULAR | Status: DC
Start: 2015-10-14 — End: 2015-10-14
  Filled 2015-10-14: qty 2

## 2015-10-14 MED ORDER — OXYCODONE-ACETAMINOPHEN 5-325 MG PO TABS: 1.0000 | ORAL_TABLET | ORAL | Status: DC | PRN

## 2015-10-14 MED ORDER — CYCLOBENZAPRINE HCL 10 MG PO TABS
10.0000 mg | ORAL_TABLET | Freq: Once | ORAL | Status: AC
  Administered 2015-10-14: 10 mg via ORAL
  Filled 2015-10-14: qty 1

## 2015-10-14 NOTE — ED Notes (Signed)
Pt ambulatory w/ steady gait to restroom. 

## 2015-10-14 NOTE — ED Provider Notes (Signed)
CSN: [id number]     Arrival date & time 10/14/15  1233 History   First MD Initiated Contact with Patient 10/14/15 1416     Chief Complaint  Patient presents with  . Back Pain     (Consider location/radiation/quality/duration/timing/severity/associated sxs/prior Treatment) HPI Comments: Jodi Bowman is a(n) 55 y.o. female who presents with c/o of Left back pain. She is a super morbidly obese female who  has a past medical history of Hypertension; Hypercholesteremia; Lower back pain; Nontoxic uninodular goiter; Calculus of gallbladder without mention of cholecystitis or obstruction; Arthritis; Stroke ([place]) (2000); Migraine; Sleep apnea; Fibromyalgia; Obesity; and Goiter. SHe c/o pain that starts in the middle left of her back and radiates around to her flank. She points to the distribution of her Left Latissimus Dorsi. She has had back pain similar to this. The pain is worse with twisting, movement or trying to hold herself upright. SHe has been to physical therapy and was instruicted in home exercise, but discontinued them because they "did not help." SHe has had trigger pint injections in the past that have helped significantly for a short time. SHe has not lost weight. Denies weakness, loss of bowel/bladder function or saddle anesthesia. Denies neck stiffness, headache, rash.  Denies fever or recent procedures to back. She denies urinary sxs, hx of kidney stones, or abdominal sxs. She denies chest pain or sob.  Patient is a 55 y.o. female presenting with back pain. The history is provided by the patient and medical records.  Back Pain Location:  Lumbar spine and thoracic spine Quality:  Aching and cramping Radiates to:  Does not radiate Pain severity:  Severe Pain is:  Unable to specify Onset quality:  Gradual Duration:  2 days Timing:  Constant Progression:  Worsening Relieved by:  Nothing Worsened by:  Twisting, movement and ambulation Ineffective treatments:  NSAIDs Associated  symptoms: no abdominal pain, no bladder incontinence, no bowel incontinence, no chest pain, no dysuria, no fever, no headaches, no leg pain, no numbness and no perianal numbness     Past Medical History  Diagnosis Date  . Hypertension   . Hypercholesteremia   . Lower back pain   . Nontoxic uninodular goiter     sees dr vollmer at [facility]  . Calculus of gallbladder without mention of cholecystitis or obstruction   . Arthritis   . Stroke ([place]) 2000  . Migraine   . Sleep apnea     STOPBANG=5  . Fibromyalgia   . Obesity   . Goiter    Past Surgical History  Procedure Laterality Date  . Knee arthroscopy  one 1995 and 1 in 1997    both knees done  . Abdominal hysterectomy    . Cesarean section  yrs ago    done x 2  . Surgery for endometriosis  yrs ago  . Thryoid biopsy  December 01, 2011     at [facility]  . Cholecystectomy  01/05/2012    Procedure: LAPAROSCOPIC CHOLECYSTECTOMY WITH INTRAOPERATIVE CHOLANGIOGRAM;  Surgeon: Pedro Earls, MD;  Location: [facility];  Service: General;  Laterality: N/A;  . Colonoscopy  10/08/2012    Procedure: COLONOSCOPY;  Surgeon: Beryle Beams, MD;  Location: [facility];  Service: Endoscopy;  Laterality: N/A;   Family History  Problem Relation Age of Onset  . Cancer Brother     colon and lung  . Cancer Maternal Grandmother     colon  . Diabetes Mother   . Hypertension Mother   . Transient ischemic  attack Mother   . Seizures Mother    Social History  Substance Use Topics  . Smoking status: Never Smoker   . Smokeless tobacco: Never Used  . Alcohol Use: 0.0 oz/week    0 Standard drinks or equivalent per week     Comment: Occasionally   OB History    No data available     Review of Systems  Constitutional: Negative for fever.  Cardiovascular: Negative for chest pain.  Gastrointestinal: Negative for abdominal pain and bowel incontinence.  Genitourinary: Negative for bladder incontinence and dysuria.  Musculoskeletal: Positive for back pain.    Neurological: Negative for numbness and headaches.  Ten systems reviewed and are negative for acute change, except as noted in the HPI.      Allergies  Shrimp and Naproxen  Home Medications   Prior to Admission medications   Medication Sig Start Date End Date Taking? Authorizing Provider  amLODipine (NORVASC) 10 MG tablet Take 10 mg by mouth daily. 11/29/14  Yes Historical Provider, MD  cholecalciferol (VITAMIN D) 1000 UNITS tablet Take 1,000 Units by mouth daily.   Yes Historical Provider, MD  DULoxetine (CYMBALTA) 30 MG capsule Take 3 capsules (90 mg total) by mouth daily. 08/01/15  Yes Marcial Pacas, MD  DULoxetine (CYMBALTA) 60 MG capsule Take 60 mg by mouth daily.    Yes Historical Provider, MD  loratadine (CLARITIN) 10 MG tablet Take 10 mg by mouth daily.    Yes Historical Provider, MD  metoprolol succinate (TOPROL-XL) 100 MG 24 hr tablet Take 1 tablet (100 mg total) by mouth daily. Take with or immediately following a meal. Patient taking differently: Take 50-100 mg by mouth See admin instructions. Pt takes 100mg  in am and 50mg  in pm 03/13/14  Yes Charolette Forward, MD  nitroGLYCERIN (NITROSTAT) 0.4 MG SL tablet Place 1 tablet (0.4 mg total) under the tongue every 5 (five) minutes x 3 doses as needed for chest pain. Patient taking differently: Place 0.4 mg under the tongue every 5 (five) minutes as needed for chest pain. Maximum 3 doses 03/13/14  Yes Charolette Forward, MD  pantoprazole (PROTONIX) 40 MG tablet Take 40 mg by mouth 2 (two) times daily.    Yes Historical Provider, MD  pregabalin (LYRICA) 75 MG capsule Take 75-150 mg by mouth 2 (two) times daily. Take 1 tablet (75 mg) every morning and 2 tablets (150 mg) at bedtime   Yes Historical Provider, MD  topiramate (TOPAMAX) 100 MG tablet Take 100 mg by mouth 2 (two) times daily. 01/28/15  Yes Historical Provider, MD  valsartan-hydrochlorothiazide (DIOVAN-HCT) 160-25 MG tablet Take 1 tablet by mouth daily. 09/19/15  Yes Historical Provider, MD   aspirin EC 81 MG EC tablet Take 1 tablet (81 mg total) by mouth daily. 03/13/14   Charolette Forward, MD  cyclobenzaprine (FLEXERIL) 10 MG tablet Take 1 tablet (10 mg total) by mouth 2 (two) times daily as needed for muscle spasms. 04/02/15   Delsa Grana, PA-C  predniSONE (DELTASONE) 20 MG tablet Take 2 tablets (40 mg total) by mouth daily. 05/26/15   Montine Circle, PA-C   BP 160/93 mmHg  Pulse 79  Temp(Src) 98.5 F (36.9 C) (Oral)  Resp 16  Ht 4\' 11"  (1.499 m)  Wt 123.378 kg  BMI 54.91 kg/m2  SpO2 95% Physical Exam  Constitutional: She is oriented to person, place, and time. She appears well-developed and well-nourished. No distress.  HENT:  Head: Normocephalic and atraumatic.  Eyes: Conjunctivae are normal. No scleral icterus.  Neck: Normal  range of motion.  Cardiovascular: Normal rate, regular rhythm and normal heart sounds.  Exam reveals no gallop and no friction rub.   No murmur heard. Pulmonary/Chest: Effort normal and breath sounds normal. No respiratory distress.  Abdominal: Soft. Bowel sounds are normal. She exhibits no distension and no mass. There is no tenderness. There is no guarding and no CVA tenderness.  Musculoskeletal:       Back:  Neurological: She is alert and oriented to person, place, and time.  Skin: Skin is warm and dry. She is not diaphoretic.    ED Course  Procedures (including critical care time) Labs Review Labs Reviewed  URINALYSIS, ROUTINE W REFLEX MICROSCOPIC (NOT AT [facility]) - Abnormal; Notable for the following:    APPearance TURBID (*)    All other components within normal limits  URINE MICROSCOPIC-ADD ON - Abnormal; Notable for the following:    Squamous Epithelial / LPF 6-30 (*)    Bacteria, UA MANY (*)    All other components within normal limits    Imaging Review No results found. I have personally reviewed and evaluated these images and lab results as part of my medical decision-making.   EKG Interpretation None       Trigger Point  Injection Date/Time:10/17/2015 3:34 PM Performed by: Margarita Mail Authorized by: Margarita Mail Consent: Verbal consent obtained. Risks and benefits: risks, benefits and alternatives were discussed Consent given by: patient Patient identity confirmed: provided patient demograhics Preparation: Patient was prepped and draped in the usual sterile fashion. Local anesthesia used: yes Anesthesia: local infiltration Local anesthetic: marcaine 0.25%  Anesthetic total: 10 ml Patient tolerance: Patient tolerated the procedure well with no immediate complications    MDM   Final diagnoses:  Left-sided low back pain without sciatica  Essential hypertension  Trigger point    Patient with musculoskeletal back pain  Negative urine. Afebrile and HDS. Doubt other etiology as it is clearly reproducible without tenderness of the abdomen. Doubt AAA, ACS Pyelo or kidney stone. Pain improved with tx. No redflag sxs.The patient appears reasonably screened and/or stabilized for discharge and I doubt any other medical condition or other [facility] requiring further screening, evaluation, or treatment in the ED at this time prior to discharge.      Margarita Mail, PA-C 10/17/15 [place], MD 10/18/15 [phone]

## 2015-10-14 NOTE — Discharge Instructions (Signed)
SEEK IMMEDIATE MEDICAL ATTENTION IF: New numbness, tingling, weakness, or problem with the use of your arms or legs.  Severe back pain not relieved with medications.  Change in bowel or bladder control.  Increasing pain in any areas of the body (such as chest or abdominal pain).  Shortness of breath, dizziness or fainting.  Nausea (feeling sick to your stomach), vomiting, fever, or sweats.  Trigger Point Injection Trigger points are areas where you have muscle pain. A trigger point injection is a shot given in the trigger point to relieve that pain. A trigger point might feel like a knot in your muscle. It hurts to press on a trigger point. Sometimes the pain spreads out (radiates) to other parts of the body. For example, pressing on a trigger point in your shoulder might cause pain in your arm or neck. You might have one trigger point. Or, you might have more than one. People often have trigger points in their upper back and lower back. They also occur often in the neck and shoulders. Pain from a trigger point lasts for a long time. It can make it hard to keep moving. You might not be able to do the exercise or physical therapy that could help you deal with the pain. A trigger point injection may help. It does not work for everyone. But, it may relieve your pain for a few days or a few months. A trigger point injection does not cure long-lasting (chronic) pain. LET YOUR CAREGIVER KNOW ABOUT:  Any allergies (especially to latex, lidocaine, or steroids).  Blood-thinning medicines that you take. These drugs can lead to bleeding or bruising after an injection. They include:  Aspirin.  Ibuprofen.  Clopidogrel.  Warfarin.  Other medicines you take. This includes all vitamins, herbs, eyedrops, over-the-counter medicines, and creams.  Use of steroids.  Recent infections.  Past problems with numbing medicines.  Bleeding problems.  Surgeries you have had.  Other health problems. RISKS  AND COMPLICATIONS A trigger point injection is a safe treatment. However, problems may develop, such as:  Minor side effects usually go away in 1 to 2 days. These may include:  Soreness.  Bruising.  Stiffness.  More serious problems are rare. But, they may include:  Bleeding under the skin (hematoma).  Skin infection.  Breaking off of the needle under your skin.  Lung puncture.  The trigger point injection may not work for you. BEFORE THE PROCEDURE You may need to stop taking any medicine that thins your blood. This is to prevent bleeding and bruising. Usually these medicines are stopped several days before the injection. No other preparation is needed. PROCEDURE  A trigger point injection can be given in your caregiver's office or in a clinic. Each injection takes 2 minutes or less.  Your caregiver will feel for trigger points. The caregiver may use a marker to circle the area for the injection.  The skin over the trigger point will be washed with a germ-killing (antiseptic) solution.  The caregiver pinches the spot for the injection.  Then, a very thin needle is used for the shot. You may feel pain or a twitching feeling when the needle enters the trigger point.  A numbing solution may be injected into the trigger point. Sometimes a drug to keep down swelling, redness, and warmth (inflammation) is also injected.  Your caregiver moves the needle around the trigger zone until the tightness and twitching goes away.  After the injection, your caregiver may put gentle pressure over the  injection site.  Then it is covered with a bandage. AFTER THE PROCEDURE  You can go right home after the injection.  The bandage can be taken off after a few hours.  You may feel sore and stiff for 1 to 2 days.  Go back to your regular activities slowly. Your caregiver may ask you to stretch your muscles. Do not do anything that takes extra energy for a few days.  Follow your  caregiver's instructions to manage and treat other pain.   This information is not intended to replace advice given to you by your health care provider. Make sure you discuss any questions you have with your health care provider.   Document Released: 09/04/2011 Document Revised: 01/10/2013 Document Reviewed: 09/04/2011 Elsevier Interactive Patient Education [employer].

## 2015-10-14 NOTE — ED Notes (Signed)
Pt c/o mid back pain radiating around the the L rib area, pt denies CP, SOB, n/v/d, pt reports pain onset x 3-4 days, denies injury to the area, denies dysuria

## 2015-10-14 NOTE — ED Notes (Signed)
Pt verbalized understanding of d/c instructions, prescriptions, and follow-up care. No further questions/concerns, VSS, assisted to lobby in wheelchair.  

## 2015-10-15 ENCOUNTER — Encounter (HOSPITAL_COMMUNITY): Payer: Self-pay

## 2015-10-15 DIAGNOSIS — Z79899 Other long term (current) drug therapy: Secondary | ICD-10-CM | POA: Diagnosis not present

## 2015-10-15 DIAGNOSIS — Z8719 Personal history of other diseases of the digestive system: Secondary | ICD-10-CM | POA: Diagnosis not present

## 2015-10-15 DIAGNOSIS — R1012 Left upper quadrant pain: Secondary | ICD-10-CM | POA: Diagnosis not present

## 2015-10-15 DIAGNOSIS — Z8669 Personal history of other diseases of the nervous system and sense organs: Secondary | ICD-10-CM | POA: Diagnosis not present

## 2015-10-15 DIAGNOSIS — I10 Essential (primary) hypertension: Secondary | ICD-10-CM | POA: Insufficient documentation

## 2015-10-15 DIAGNOSIS — Z8673 Personal history of transient ischemic attack (TIA), and cerebral infarction without residual deficits: Secondary | ICD-10-CM | POA: Insufficient documentation

## 2015-10-15 DIAGNOSIS — G43909 Migraine, unspecified, not intractable, without status migrainosus: Secondary | ICD-10-CM | POA: Insufficient documentation

## 2015-10-15 DIAGNOSIS — Z8639 Personal history of other endocrine, nutritional and metabolic disease: Secondary | ICD-10-CM | POA: Diagnosis not present

## 2015-10-15 DIAGNOSIS — M1990 Unspecified osteoarthritis, unspecified site: Secondary | ICD-10-CM | POA: Insufficient documentation

## 2015-10-15 DIAGNOSIS — E669 Obesity, unspecified: Secondary | ICD-10-CM | POA: Diagnosis not present

## 2015-10-15 DIAGNOSIS — R109 Unspecified abdominal pain: Secondary | ICD-10-CM | POA: Diagnosis present

## 2015-10-15 DIAGNOSIS — R609 Edema, unspecified: Secondary | ICD-10-CM | POA: Diagnosis not present

## 2015-10-15 LAB — COMPREHENSIVE METABOLIC PANEL
ALT: 19 U/L (ref 14–54)
AST: 16 U/L (ref 15–41)
Albumin: 3.3 g/dL — ABNORMAL LOW (ref 3.5–5.0)
Alkaline Phosphatase: 66 U/L (ref 38–126)
Anion gap: 8 (ref 5–15)
BUN: 13 mg/dL (ref 6–20)
CO2: 29 mmol/L (ref 22–32)
Calcium: 9 mg/dL (ref 8.9–10.3)
Chloride: 105 mmol/L (ref 101–111)
Creatinine, Ser: 1.14 mg/dL — ABNORMAL HIGH (ref 0.44–1.00)
GFR calc Af Amer: >60 mL/min (ref >60)
GFR calc non Af Amer: 53 mL/min — ABNORMAL LOW (ref >60)
Glucose, Bld: 110 mg/dL — ABNORMAL HIGH (ref 65–99)
Potassium: 4.4 mmol/L (ref 3.5–5.1)
Sodium: 142 mmol/L (ref 135–145)
Total Bilirubin: 0.5 mg/dL (ref 0.3–1.2)
Total Protein: 6.1 g/dL — ABNORMAL LOW (ref 6.5–8.1)

## 2015-10-15 LAB — CBC
HCT: 39.1 % (ref 36.0–46.0)
Hemoglobin: 13 g/dL (ref 12.0–15.0)
MCH: 29.1 pg (ref 26.0–34.0)
MCHC: 33.2 g/dL (ref 30.0–36.0)
MCV: 87.5 fL (ref 78.0–100.0)
Platelets: 286 10*3/uL (ref 150–400)
RBC: 4.47 MIL/uL (ref 3.87–5.11)
RDW: 15.2 % (ref 11.5–15.5)
WBC: 12.1 10*3/uL — ABNORMAL HIGH (ref 4.0–10.5)

## 2015-10-15 NOTE — ED Notes (Signed)
Pt was here yesterday for flank pain and abd pain for the past 3 days and dx with muscle spasms. Given Rx for flexeril and percocet. States the pain is just not going away. States they did a urine sample and it was negative for a UTI. Denies any hx of kidney stones.

## 2015-10-15 NOTE — ED Notes (Signed)
Nurse first rounds: pt updated on wait time-warm blanket given

## 2015-10-16 ENCOUNTER — Emergency Department (HOSPITAL_COMMUNITY): Payer: Medicaid Other

## 2015-10-16 ENCOUNTER — Emergency Department (HOSPITAL_COMMUNITY)
Admission: EM | Admit: 2015-10-16 | Discharge: 2015-10-16 | Disposition: A | Discharge status: Home / Self Care | Payer: Medicaid Other | Attending: Emergency Medicine | Admitting: Emergency Medicine

## 2015-10-16 DIAGNOSIS — R109 Unspecified abdominal pain: Secondary | ICD-10-CM

## 2015-10-16 LAB — URINALYSIS, ROUTINE W REFLEX MICROSCOPIC
Bilirubin Urine: NEGATIVE
Glucose, UA: NEGATIVE mg/dL
Hgb urine dipstick: NEGATIVE
Ketones, ur: NEGATIVE mg/dL
Leukocytes, UA: NEGATIVE
Nitrite: NEGATIVE
Protein, ur: NEGATIVE mg/dL
Specific Gravity, Urine: 1.018 (ref 1.005–1.030)
pH: 5 (ref 5.0–8.0)

## 2015-10-16 MED ORDER — OXYCODONE-ACETAMINOPHEN 5-325 MG PO TABS: 1.0000 | ORAL_TABLET | ORAL | Status: DC | PRN

## 2015-10-16 MED ORDER — ONDANSETRON HCL 4 MG/2ML IJ SOLN
4.0000 mg | Freq: Once | INTRAMUSCULAR | Status: AC
  Administered 2015-10-16: 4 mg via INTRAVENOUS
  Filled 2015-10-16: qty 2

## 2015-10-16 MED ORDER — MORPHINE SULFATE (PF) 4 MG/ML IV SOLN
4.0000 mg | Freq: Once | INTRAVENOUS | Status: AC
  Administered 2015-10-16: 4 mg via INTRAVENOUS
  Filled 2015-10-16: qty 1

## 2015-10-16 MED ORDER — SODIUM CHLORIDE 0.9 % IV BOLUS (SEPSIS)
500.0000 mL | Freq: Once | INTRAVENOUS | Status: AC
  Administered 2015-10-16: 500 mL via INTRAVENOUS

## 2015-10-16 NOTE — ED Provider Notes (Signed)
CSN: [id number]     Arrival date & time 10/15/15  2121 History  By signing my name below, I, Eustaquio Maize, attest that this documentation has been prepared under the direction and in the presence of [name], MD. Electronically Signed: Eustaquio Maize, ED Scribe. 10/16/2015. 12:47 AM.   Chief Complaint  Patient presents with  . Flank Pain  . Abdominal Pain   The history is provided by the patient. No language interpreter was used.     HPI Comments: Jodi Bowman is a 55 y.o. female with hx HTN and HLD who presents to the Emergency Department complaining of gradual onset, waxing and waning, worsening, 10/10, cramping, left flank pain radiating to LUQ x 3 days. Pt was seen in the ED on 10/14/2015 (approximately 2 day ago) for the same symptoms and states she was diagnosed with muscle spasms. She was given Rx for Flexeril and Percocet without relief, prompting her to return today. Denies nausea, vomiting, hematuria, dysuria, fever, chills, or any other associated symptoms. No hx previous similar symptoms or hx kidney stones.   Past Medical History  Diagnosis Date  . Hypertension   . Hypercholesteremia   . Lower back pain   . Nontoxic uninodular goiter     sees dr vollmer at [facility]  . Calculus of gallbladder without mention of cholecystitis or obstruction   . Arthritis   . Stroke ([place]) 2000  . Migraine   . Sleep apnea     STOPBANG=5  . Fibromyalgia   . Obesity   . Goiter    Past Surgical History  Procedure Laterality Date  . Knee arthroscopy  one 1995 and 1 in 1997    both knees done  . Abdominal hysterectomy    . Cesarean section  yrs ago    done x 2  . Surgery for endometriosis  yrs ago  . Thryoid biopsy  December 01, 2011     at [facility]  . Cholecystectomy  01/05/2012    Procedure: LAPAROSCOPIC CHOLECYSTECTOMY WITH INTRAOPERATIVE CHOLANGIOGRAM;  Surgeon: Pedro Earls, MD;  Location: [facility];  Service: General;  Laterality: N/A;  . Colonoscopy  10/08/2012     Procedure: COLONOSCOPY;  Surgeon: Beryle Beams, MD;  Location: [facility];  Service: Endoscopy;  Laterality: N/A;   Family History  Problem Relation Age of Onset  . Cancer Brother     colon and lung  . Cancer Maternal Grandmother     colon  . Diabetes Mother   . Hypertension Mother   . Transient ischemic attack Mother   . Seizures Mother    Social History  Substance Use Topics  . Smoking status: Never Smoker   . Smokeless tobacco: Never Used  . Alcohol Use: 0.0 oz/week    0 Standard drinks or equivalent per week     Comment: Occasionally   OB History    No data available     Review of Systems  Constitutional: Negative for fever and chills.  Respiratory: Negative for shortness of breath.   Cardiovascular: Negative for chest pain.  Gastrointestinal: Positive for abdominal pain (LUQ). Negative for nausea and vomiting.  Genitourinary: Positive for flank pain (Left). Negative for dysuria and hematuria.  Skin: Negative for rash.  All other systems reviewed and are negative.  Allergies  Shrimp and Naproxen  Home Medications   Prior to Admission medications   Medication Sig Start Date End Date Taking? Authorizing Provider  amLODipine (NORVASC) 10 MG tablet Take 10 mg by mouth daily.  11/29/14  Yes Historical Provider, MD  cholecalciferol (VITAMIN D) 1000 UNITS tablet Take 1,000 Units by mouth daily.   Yes Historical Provider, MD  cyclobenzaprine (FLEXERIL) 10 MG tablet Take 1 tablet (10 mg total) by mouth 2 (two) times daily as needed for muscle spasms. 10/14/15  Yes Margarita Mail, PA-C  DULoxetine (CYMBALTA) 30 MG capsule Take 3 capsules (90 mg total) by mouth daily. Patient taking differently: Take 60 mg by mouth daily.  08/01/15  Yes Marcial Pacas, MD  loratadine (CLARITIN) 10 MG tablet Take 10 mg by mouth daily.    Yes Historical Provider, MD  metoprolol succinate (TOPROL-XL) 100 MG 24 hr tablet Take 1 tablet (100 mg total) by mouth daily. Take with or immediately following a  meal. Patient taking differently: Take 50-100 mg by mouth See admin instructions. Pt takes 100mg  in am and 50mg  in pm 03/13/14  Yes Charolette Forward, MD  nitroGLYCERIN (NITROSTAT) 0.4 MG SL tablet Place 1 tablet (0.4 mg total) under the tongue every 5 (five) minutes x 3 doses as needed for chest pain. Patient taking differently: Place 0.4 mg under the tongue every 5 (five) minutes as needed for chest pain. Maximum 3 doses 03/13/14  Yes Charolette Forward, MD  pantoprazole (PROTONIX) 40 MG tablet Take 40 mg by mouth 2 (two) times daily.    Yes Historical Provider, MD  pregabalin (LYRICA) 75 MG capsule Take 75-150 mg by mouth 2 (two) times daily. Take 1 tablet (75 mg) every morning and 2 tablets (150 mg) at bedtime   Yes Historical Provider, MD  topiramate (TOPAMAX) 100 MG tablet Take 100 mg by mouth 2 (two) times daily. 01/28/15  Yes Historical Provider, MD  valsartan-hydrochlorothiazide (DIOVAN-HCT) 160-25 MG tablet Take 1 tablet by mouth daily. 09/19/15  Yes Historical Provider, MD  aspirin EC 81 MG EC tablet Take 1 tablet (81 mg total) by mouth daily. Patient not taking: Reported on 10/16/2015 03/13/14   Charolette Forward, MD  oxyCODONE-acetaminophen (PERCOCET) 5-325 MG tablet Take 1-2 tablets by mouth every 4 (four) hours as needed. 10/16/15   [name], MD  predniSONE (DELTASONE) 20 MG tablet Take 2 tablets (40 mg total) by mouth daily. Patient not taking: Reported on 10/16/2015 05/26/15   Montine Circle, PA-C   Triage Vitals:  BP 188/86 mmHg  Pulse 85  Temp(Src) 97.4 F (36.3 C) (Oral)  Resp 18  Ht 4\' 11"  (1.499 m)  Wt 298 lb 4 oz (135.285 kg)  BMI 60.21 kg/m2  SpO2 98%   Physical Exam  Constitutional: She is oriented to person, place, and time. She appears well-developed and well-nourished. No distress.  Obese  HENT:  Head: Normocephalic and atraumatic.  Cardiovascular: Normal rate, regular rhythm and normal heart sounds.   No murmur heard. Pulmonary/Chest: Effort normal and breath sounds  normal. No respiratory distress. She has no wheezes.  Abdominal: Soft. Bowel sounds are normal. There is tenderness. There is no rebound and no guarding.  Left flank and left upper quadrant tenderness palpation  Musculoskeletal: She exhibits edema.  Neurological: She is alert and oriented to person, place, and time.  Skin: Skin is warm and dry. No rash noted.  Psychiatric: She has a normal mood and affect.  Nursing note and vitals reviewed.   ED Course  Procedures (including critical care time)  DIAGNOSTIC STUDIES: Oxygen Saturation is 98% on RA, normal by my interpretation.    COORDINATION OF CARE: 12:47 AM-Discussed treatment plan which includes CT Renal stone study, UA, CMP, and CBC with  pt at bedside and pt agreed to plan.   Labs Review Labs Reviewed  COMPREHENSIVE METABOLIC PANEL - Abnormal; Notable for the following:    Glucose, Bld 110 (*)    Creatinine, Ser 1.14 (*)    Total Protein 6.1 (*)    Albumin 3.3 (*)    GFR calc non Af Amer 53 (*)    All other components within normal limits  CBC - Abnormal; Notable for the following:    WBC 12.1 (*)    All other components within normal limits  URINALYSIS, ROUTINE W REFLEX MICROSCOPIC (NOT AT [facility]) - Abnormal; Notable for the following:    APPearance HAZY (*)    All other components within normal limits    Imaging Review Ct Renal Stone Study  10/16/2015  CLINICAL DATA:  LEFT flank pain for 3 days. History of cholelithiasis, hypertension. EXAM: CT ABDOMEN AND PELVIS WITHOUT CONTRAST TECHNIQUE: Multidetector CT imaging of the abdomen and pelvis was performed following the standard protocol without IV contrast. COMPARISON:  None. FINDINGS: LUNG BASES: Included view of the lung bases are clear. The visualized heart and pericardium are unremarkable. KIDNEYS/BLADDER: Kidneys are orthotopic, demonstrating normal size and morphology. No nephrolithiasis, hydronephrosis; limited assessment for renal masses on this nonenhanced  examination. 2.5 cm simple cyst exophytic from upper pole of RIGHT kidney. 13 mm cyst exophytic lower pole of LEFT kidney. The unopacified ureters are normal in course and caliber. Urinary bladder is partially distended and unremarkable. SOLID ORGANS: The liver, spleen, pancreas and adrenal glands are unremarkable for this non-contrast examination. Status post cholecystectomy. GASTROINTESTINAL TRACT: The stomach, small and large bowel are normal in course and caliber without inflammatory changes, the sensitivity may be decreased by lack of enteric contrast. Normal appendix. PERITONEUM/RETROPERITONEUM: Aortoiliac vessels are normal in course and caliber, minimal calcific atherosclerosis. 13 mm short axis portal caval lymph node is likely reactive. Small mesenteric lymph nodes. Status post hysterectomy. No intraperitoneal free fluid nor free air. Phleboliths in the pelvis. SOFT TISSUES/ OSSEOUS STRUCTURES: Nonsuspicious. Small to moderate fat containing umbilical hernia. Anterior abdominal wall scarring. Moderate lower lumbar facet arthropathy. IMPRESSION: No urolithiasis, obstructive uropathy nor acute intra-abdominal/ pelvic process. Electronically Signed   By: Elon Alas M.D.   On: 10/16/2015 01:59   I have personally reviewed and evaluated these images and lab results as part of my medical decision-making.   EKG Interpretation None      MDM   Final diagnoses:  Left flank pain     patient presents with persistent left flank pain.. Nontoxic on exam. Afebrile. Tender over the left flank and abdomen in a dermatomal distribution. No evidence of rash. Lab work and urinalysis continued to be reassuring. She has no other associated symptoms. She is given pain and nausea medication. CT scan obtained to rule out kidney stone. This is unremarkable. She also ha a normal caliber aorta and have low suspicion at this time for AAA. Discussed with patient that she may have a prodrome to shingles.   She  discussed with patient  Monitoring for rash. She needs to follow-up with her primary physician as soon as possible for recheck.  After history, exam, and medical workup I feel the patient has been appropriately medically screened and is safe for discharge home. Pertinent diagnoses were discussed with the patient. Patient was given return precautions.  I personally performed the services described in this documentation, which was scribed in my presence. The recorded information has been reviewed and is accurate.  [name], MD 10/16/15 [phone]

## 2015-10-16 NOTE — ED Notes (Signed)
Dr. Horton at bedside. 

## 2015-10-16 NOTE — Discharge Instructions (Signed)
You were seen today for continued flank pain. You have no evidence of kidney stone in your lab work remains unremarkable. Sometimes she can have flank pain prior to the onset of rash related to  Shingles. Monitor for rash. Continue pain medication at home. Follow-up with primary physician.  Flank Pain Flank pain refers to pain that is located on the side of the body between the upper abdomen and the back. The pain may occur over a short period of time (acute) or may be long-term or reoccurring (chronic). It may be mild or severe. Flank pain can be caused by many things. CAUSES  Some of the more common causes of flank pain include:  Muscle strains.   Muscle spasms.   A disease of your spine (vertebral disk disease).   A lung infection (pneumonia).   Fluid around your lungs (pulmonary edema).   A kidney infection.   Kidney stones.   A very painful skin rash caused by the chickenpox virus (shingles).   Gallbladder disease.  [place] care will depend on the cause of your pain. In general,  Rest as directed by your caregiver.  Drink enough fluids to keep your urine clear or pale yellow.  Only take over-the-counter or prescription medicines as directed by your caregiver. Some medicines may help relieve the pain.  Tell your caregiver about any changes in your pain.  Follow up with your caregiver as directed. SEEK IMMEDIATE MEDICAL CARE IF:   Your pain is not controlled with medicine.   You have new or worsening symptoms.  Your pain increases.   You have abdominal pain.   You have shortness of breath.   You have persistent nausea or vomiting.   You have swelling in your abdomen.   You feel faint or pass out.   You have blood in your urine.  You have a fever or persistent symptoms for more than 2-3 days.  You have a fever and your symptoms suddenly get worse. MAKE SURE YOU:   Understand these instructions.  Will watch your  condition.  Will get help right away if you are not doing well or get worse.   This information is not intended to replace advice given to you by your health care provider. Make sure you discuss any questions you have with your health care provider.   Document Released: 11/06/2005 Document Revised: 06/09/2012 Document Reviewed: 04/29/2012 Elsevier Interactive Patient Education [employer].

## 2015-10-25 ENCOUNTER — Emergency Department (HOSPITAL_COMMUNITY)
Admission: EM | Admit: 2015-10-25 | Discharge: 2015-10-25 | Disposition: A | Discharge status: Home / Self Care | Payer: Medicaid Other | Attending: Emergency Medicine | Admitting: Emergency Medicine

## 2015-10-25 ENCOUNTER — Encounter (HOSPITAL_COMMUNITY): Payer: Self-pay | Admitting: *Deleted

## 2015-10-25 ENCOUNTER — Emergency Department (HOSPITAL_COMMUNITY): Payer: Medicaid Other

## 2015-10-25 DIAGNOSIS — E7800 Pure hypercholesterolemia, unspecified: Secondary | ICD-10-CM | POA: Insufficient documentation

## 2015-10-25 DIAGNOSIS — Z7952 Long term (current) use of systemic steroids: Secondary | ICD-10-CM | POA: Diagnosis not present

## 2015-10-25 DIAGNOSIS — M1990 Unspecified osteoarthritis, unspecified site: Secondary | ICD-10-CM | POA: Diagnosis not present

## 2015-10-25 DIAGNOSIS — Z8669 Personal history of other diseases of the nervous system and sense organs: Secondary | ICD-10-CM | POA: Diagnosis not present

## 2015-10-25 DIAGNOSIS — Z79899 Other long term (current) drug therapy: Secondary | ICD-10-CM | POA: Insufficient documentation

## 2015-10-25 DIAGNOSIS — K2970 Gastritis, unspecified, without bleeding: Secondary | ICD-10-CM

## 2015-10-25 DIAGNOSIS — Z8673 Personal history of transient ischemic attack (TIA), and cerebral infarction without residual deficits: Secondary | ICD-10-CM | POA: Diagnosis not present

## 2015-10-25 DIAGNOSIS — M797 Fibromyalgia: Secondary | ICD-10-CM | POA: Diagnosis not present

## 2015-10-25 DIAGNOSIS — G43909 Migraine, unspecified, not intractable, without status migrainosus: Secondary | ICD-10-CM | POA: Insufficient documentation

## 2015-10-25 DIAGNOSIS — R109 Unspecified abdominal pain: Secondary | ICD-10-CM | POA: Diagnosis present

## 2015-10-25 DIAGNOSIS — R1011 Right upper quadrant pain: Secondary | ICD-10-CM

## 2015-10-25 DIAGNOSIS — I10 Essential (primary) hypertension: Secondary | ICD-10-CM | POA: Insufficient documentation

## 2015-10-25 LAB — COMPREHENSIVE METABOLIC PANEL
ALT: 18 U/L (ref 14–54)
AST: 19 U/L (ref 15–41)
Albumin: 3.3 g/dL — ABNORMAL LOW (ref 3.5–5.0)
Alkaline Phosphatase: 71 U/L (ref 38–126)
Anion gap: 9 (ref 5–15)
BUN: 9 mg/dL (ref 6–20)
CO2: 28 mmol/L (ref 22–32)
Calcium: 9.1 mg/dL (ref 8.9–10.3)
Chloride: 103 mmol/L (ref 101–111)
Creatinine, Ser: 1.03 mg/dL — ABNORMAL HIGH (ref 0.44–1.00)
GFR calc Af Amer: >60 mL/min (ref >60)
GFR calc non Af Amer: 60 mL/min — ABNORMAL LOW (ref >60)
Glucose, Bld: 114 mg/dL — ABNORMAL HIGH (ref 65–99)
Potassium: 4.1 mmol/L (ref 3.5–5.1)
Sodium: 140 mmol/L (ref 135–145)
Total Bilirubin: 0.6 mg/dL (ref 0.3–1.2)
Total Protein: 6.5 g/dL (ref 6.5–8.1)

## 2015-10-25 LAB — CBC
HCT: 42 % (ref 36.0–46.0)
Hemoglobin: 13.7 g/dL (ref 12.0–15.0)
MCH: 28.6 pg (ref 26.0–34.0)
MCHC: 32.6 g/dL (ref 30.0–36.0)
MCV: 87.7 fL (ref 78.0–100.0)
Platelets: 280 10*3/uL (ref 150–400)
RBC: 4.79 MIL/uL (ref 3.87–5.11)
RDW: 15.5 % (ref 11.5–15.5)
WBC: 7.6 10*3/uL (ref 4.0–10.5)

## 2015-10-25 LAB — URINALYSIS, ROUTINE W REFLEX MICROSCOPIC
Bilirubin Urine: NEGATIVE
Glucose, UA: NEGATIVE mg/dL
Hgb urine dipstick: NEGATIVE
Ketones, ur: NEGATIVE mg/dL
Leukocytes, UA: NEGATIVE
Nitrite: NEGATIVE
Protein, ur: NEGATIVE mg/dL
Specific Gravity, Urine: 1.015 (ref 1.005–1.030)
pH: 7 (ref 5.0–8.0)

## 2015-10-25 LAB — LIPASE, BLOOD: Lipase: 18 U/L (ref 11–51)

## 2015-10-25 MED ORDER — MORPHINE SULFATE (PF) 4 MG/ML IV SOLN
4.0000 mg | INTRAVENOUS | Status: DC | PRN
  Administered 2015-10-25: 4 mg via INTRAVENOUS
  Filled 2015-10-25 (×2): qty 1

## 2015-10-25 MED ORDER — TRAMADOL HCL 50 MG PO TABS: 50.0000 mg | ORAL_TABLET | Freq: Four times a day (QID) | ORAL | Status: DC | PRN

## 2015-10-25 MED ORDER — SUCRALFATE 1 G PO TABS: 1.0000 g | ORAL_TABLET | Freq: Four times a day (QID) | ORAL | Status: DC

## 2015-10-25 MED ORDER — ONDANSETRON HCL 4 MG/2ML IJ SOLN
4.0000 mg | Freq: Once | INTRAMUSCULAR | Status: AC
  Administered 2015-10-25: 4 mg via INTRAVENOUS
  Filled 2015-10-25: qty 2

## 2015-10-25 MED ORDER — IOHEXOL 300 MG/ML  SOLN
100.0000 mL | Freq: Once | INTRAMUSCULAR | Status: AC | PRN
  Administered 2015-10-25: 100 mL via INTRAVENOUS

## 2015-10-25 NOTE — ED Provider Notes (Signed)
CSN: [id number]     Arrival date & time 10/25/15  0805 History   First MD Initiated Contact with Patient 10/25/15 [phone]     Chief Complaint  Patient presents with  . Abdominal Pain      HPI  Patient presents for evaluation of right-sided abdominal pain. Seen and evaluated a week ago for left upper quadrant abdominal pain. States this is more right-sided right flank. States it is a burning and sharp associated with nausea. No vomiting or diarrhea. No urinary symptoms. No fevers. No fall or injury. No difficulty breathing pleuritic component or shortness of breath.  History of reflux, takes Protonix. History of cholecystectomy. Also has fibromyalgia, chronic pain, frequent abdominal pain evaluations.  Past Medical History  Diagnosis Date  . Hypertension   . Hypercholesteremia   . Lower back pain   . Nontoxic uninodular goiter     sees dr vollmer at [facility]  . Calculus of gallbladder without mention of cholecystitis or obstruction   . Arthritis   . Stroke ([place]) 2000  . Migraine   . Sleep apnea     STOPBANG=5  . Fibromyalgia   . Obesity   . Goiter    Past Surgical History  Procedure Laterality Date  . Knee arthroscopy  one 1995 and 1 in 1997    both knees done  . Abdominal hysterectomy    . Cesarean section  yrs ago    done x 2  . Surgery for endometriosis  yrs ago  . Thryoid biopsy  December 01, 2011     at [facility]  . Cholecystectomy  01/05/2012    Procedure: LAPAROSCOPIC CHOLECYSTECTOMY WITH INTRAOPERATIVE CHOLANGIOGRAM;  Surgeon: Pedro Earls, MD;  Location: [facility];  Service: General;  Laterality: N/A;  . Colonoscopy  10/08/2012    Procedure: COLONOSCOPY;  Surgeon: Beryle Beams, MD;  Location: [facility];  Service: Endoscopy;  Laterality: N/A;   Family History  Problem Relation Age of Onset  . Cancer Brother     colon and lung  . Cancer Maternal Grandmother     colon  . Diabetes Mother   . Hypertension Mother   . Transient ischemic attack Mother   . Seizures  Mother    Social History  Substance Use Topics  . Smoking status: Never Smoker   . Smokeless tobacco: Never Used  . Alcohol Use: 0.0 oz/week    0 Standard drinks or equivalent per week     Comment: Occasionally   OB History    No data available     Review of Systems  Constitutional: Negative for fever, chills, diaphoresis, appetite change and fatigue.  HENT: Negative for mouth sores, sore throat and trouble swallowing.   Eyes: Negative for visual disturbance.  Respiratory: Negative for cough, chest tightness, shortness of breath and wheezing.   Cardiovascular: Negative for chest pain.  Gastrointestinal: Positive for nausea, abdominal pain and abdominal distention. Negative for vomiting and diarrhea.  Endocrine: Negative for polydipsia, polyphagia and polyuria.  Genitourinary: Negative for dysuria, frequency and hematuria.  Musculoskeletal: Negative for gait problem.  Skin: Negative for color change, pallor and rash.  Neurological: Negative for dizziness, syncope, light-headedness and headaches.  Hematological: Does not bruise/bleed easily.  Psychiatric/Behavioral: Negative for behavioral problems and confusion.      Allergies  Shrimp and Naproxen  Home Medications   Prior to Admission medications   Medication Sig Start Date End Date Taking? Authorizing Provider  amLODipine (NORVASC) 10 MG tablet Take 10 mg by mouth daily. 11/29/14  Yes Historical Provider, MD  cholecalciferol (VITAMIN D) 1000 UNITS tablet Take 1,000 Units by mouth daily.   Yes Historical Provider, MD  DULoxetine (CYMBALTA) 30 MG capsule Take 3 capsules (90 mg total) by mouth daily. Patient taking differently: Take 60 mg by mouth daily.  08/01/15  Yes Marcial Pacas, MD  loratadine (CLARITIN) 10 MG tablet Take 10 mg by mouth daily.    Yes Historical Provider, MD  metoprolol succinate (TOPROL-XL) 100 MG 24 hr tablet Take 1 tablet (100 mg total) by mouth daily. Take with or immediately following a meal. Patient  taking differently: Take 50-100 mg by mouth See admin instructions. Pt takes 100mg  in am and 50mg  in pm 03/13/14  Yes Charolette Forward, MD  oxyCODONE-acetaminophen (PERCOCET) 5-325 MG tablet Take 1-2 tablets by mouth every 4 (four) hours as needed. 10/16/15  Yes [name], MD  pantoprazole (PROTONIX) 40 MG tablet Take 40 mg by mouth 2 (two) times daily.    Yes Historical Provider, MD  predniSONE (DELTASONE) 20 MG tablet Take 2 tablets (40 mg total) by mouth daily. 05/26/15  Yes Montine Circle, PA-C  pregabalin (LYRICA) 75 MG capsule Take 75-150 mg by mouth 2 (two) times daily. Take 1 tablet (75 mg) every morning and 2 tablets (150 mg) at bedtime   Yes Historical Provider, MD  topiramate (TOPAMAX) 100 MG tablet Take 100 mg by mouth 2 (two) times daily. 01/28/15  Yes Historical Provider, MD  valsartan-hydrochlorothiazide (DIOVAN-HCT) 160-25 MG tablet Take 1 tablet by mouth daily. 09/19/15  Yes Historical Provider, MD  aspirin EC 81 MG EC tablet Take 1 tablet (81 mg total) by mouth daily. Patient not taking: Reported on 10/16/2015 03/13/14   Charolette Forward, MD  nitroGLYCERIN (NITROSTAT) 0.4 MG SL tablet Place 1 tablet (0.4 mg total) under the tongue every 5 (five) minutes x 3 doses as needed for chest pain. Patient taking differently: Place 0.4 mg under the tongue every 5 (five) minutes as needed for chest pain. Maximum 3 doses 03/13/14   Charolette Forward, MD  sucralfate (CARAFATE) 1 g tablet Take 1 tablet (1 g total) by mouth 4 (four) times daily. 10/25/15   Tanna Furry, MD  traMADol (ULTRAM) 50 MG tablet Take 1 tablet (50 mg total) by mouth every 6 (six) hours as needed. 10/25/15   Tanna Furry, MD   BP 140/79 mmHg  Pulse 95  Temp(Src) 97.5 F (36.4 C) (Oral)  Resp 20  SpO2 97% Physical Exam  Constitutional: She is oriented to person, place, and time. She appears well-developed and well-nourished. No distress.  This is a short, morbidly obese black female. Has a large protuberant abdomen.  HENT:  Head:  Normocephalic.  Eyes: Conjunctivae are normal. Pupils are equal, round, and reactive to light. No scleral icterus.  Neck: Normal range of motion. Neck supple. No thyromegaly present.  Cardiovascular: Normal rate and regular rhythm.  Exam reveals no gallop and no friction rub.   No murmur heard. Pulmonary/Chest: Effort normal and breath sounds normal. No respiratory distress. She has no wheezes. She has no rales.  Abdominal: Soft. Bowel sounds are normal. She exhibits no distension. There is no tenderness. There is no rebound.    Primarily right subcostal upper quadrant pain. Her exam sensitivity is limited by the large girth of her abdominal wall. She is of short stature and over 300 pounds.  Musculoskeletal: Normal range of motion.  Neurological: She is alert and oriented to person, place, and time.  Skin: Skin is warm and dry.  No rash noted.  Psychiatric: She has a normal mood and affect. Her behavior is normal.    ED Course  Procedures (including critical care time) Labs Review Labs Reviewed  COMPREHENSIVE METABOLIC PANEL - Abnormal; Notable for the following:    Glucose, Bld 114 (*)    Creatinine, Ser 1.03 (*)    Albumin 3.3 (*)    GFR calc non Af Amer 60 (*)    All other components within normal limits  URINALYSIS, ROUTINE W REFLEX MICROSCOPIC (NOT AT [facility]) - Abnormal; Notable for the following:    APPearance HAZY (*)    All other components within normal limits  LIPASE, BLOOD  CBC    Imaging Review Ct Abdomen Pelvis W Contrast  10/25/2015  CLINICAL DATA:  Right-sided pain since yesterday. EXAM: CT ABDOMEN AND PELVIS WITH CONTRAST TECHNIQUE: Multidetector CT imaging of the abdomen and pelvis was performed using the standard protocol following bolus administration of intravenous contrast. CONTRAST:  161mL OMNIPAQUE IOHEXOL 300 MG/ML  SOLN COMPARISON:  October 16, 2015 FINDINGS: Normal lung bases. No free air or free fluid. There is a fat containing umbilical hernia. Scar tissue  is seen in the deep subcutaneous tissues at the midline of the lower abdomen and pelvis. No other ventral wall defects are seen. The patient is status post cholecystectomy. The liver, portal vein, spleen, adrenal glands, and pancreas are normal. Bilateral renal cysts are stable. No suspicious solid masses. No hydronephrosis, perinephric stranding, or stones. No ureterectasis or ureteral stones. The abdominal aorta is normal in caliber with no dissection. No adenopathy is identified. The stomach and small bowel are within normal limits. The colon and appendix are normal as well. The patient is status post hysterectomy. The right ovary appears to been retained with no mass. The bladder is normal. No adenopathy or mass in the pelvis. No acute bony abnormalities are seen. There are mild degenerative changes in the spine. Delayed images demonstrate no filling defects within the upper renal collecting systems. IMPRESSION: 1. No acute abnormalities.  See above for incidental findings. Electronically Signed   By: Dorise Bullion III M.D   On: 10/25/2015 11:30   I have personally reviewed and evaluated these images and lab results as part of my medical decision-making.   EKG Interpretation None      MDM   Final diagnoses:  Right upper quadrant pain  Gastritis    Had normal CT abdomen without contrast last week. CT abdomen was obtained today. Indication is abdominal pain of uncertain etiology with limited exam due to morbid obesity. No acute abnormalities noted. Reassuring labs and hepatobiliary enzymes. Plan is home, Protonix twice daily, add Carafate, she does not use alcohol tobacco or anti-inflammatories. She does drink caffeine in excess. I have asked her to limit caffeine. Bland diet small frequent meals primary care follow-up.    Tanna Furry, MD 10/25/15 1233

## 2015-10-25 NOTE — ED Notes (Signed)
Morphine 4mg  pulled at approximately 1019 am for patient but pt then decided not to take medication. This Probation officer returned unopened vial of morphine to [employer], Pharmacist at [phone]. Was unable to return to pyxis.

## 2015-10-25 NOTE — Discharge Instructions (Signed)
Gastritis, Adult Gastritis is soreness and puffiness (inflammation) of the lining of the stomach. If you do not get help, gastritis can cause bleeding and sores (ulcers) in the stomach. HOME CARE   Only take medicine as told by your doctor.  If you were given antibiotic medicines, take them as told. Finish the medicines even if you start to feel better.  Drink enough fluids to keep your pee (urine) clear or pale yellow.  Avoid foods and drinks that make your problems worse. Foods you may want to avoid include:  Caffeine or alcohol.  Chocolate.  Mint.  Garlic and onions.  Spicy foods.  Citrus fruits, including oranges, lemons, or limes.  Food containing tomatoes, including sauce, chili, salsa, and pizza.  Fried and fatty foods.  Eat small meals throughout the day instead of large meals. GET HELP RIGHT AWAY IF:   You have black or dark red poop (stools).  You throw up (vomit) blood. It may look like coffee grounds.  You cannot keep fluids down.  Your belly (abdominal) pain gets worse.  You have a fever.  You do not feel better after 1 week.  You have any other questions or concerns. MAKE SURE YOU:   Understand these instructions.  Will watch your condition.  Will get help right away if you are not doing well or get worse.   This information is not intended to replace advice given to you by your health care provider. Make sure you discuss any questions you have with your health care provider.   Document Released: 03/03/2008 Document Revised: 12/08/2011 Document Reviewed: 10/29/2011 Elsevier Interactive Patient Education 2016 Else[employer] Inc[employer]  Abdominal Pain, Adult Many things can cause abdominal pain. Usually, abdominal pain is not caused by a disease and will improve without treatment. It can often be observed and treated at home. Your health care provider will do a physical exam and possibly order blood tests and X-rays to help determine the seriousness of your  pain. However, in many cases, more time must pass before a clear cause of the pain can be found. Before that point, your health care provider may not know if you need more testing or further treatment. HOME CARE INSTRUCTIONS Monitor your abdominal pain for any changes. The following actions may help to alleviate any discomfort you are experiencing:  Only take over-the-counter or prescription medicines as directed by your health care provider.  Do not take laxatives unless directed to do so by your health care provider.  Try a clear liquid diet (broth, tea, or water) as directed by your health care provider. Slowly move to a bland diet as tolerated. SEEK MEDICAL CARE IF:  You have unexplained abdominal pain.  You have abdominal pain associated with nausea or diarrhea.  You have pain when you urinate or have a bowel movement.  You experience abdominal pain that wakes you in the night.  You have abdominal pain that is worsened or improved by eating food.  You have abdominal pain that is worsened with eating fatty foods.  You have a fever. SEEK IMMEDIATE MEDICAL CARE IF:  Your pain does not go away within 2 hours.  You keep throwing up (vomiting).  Your pain is felt only in portions of the abdomen, such as the right side or the left lower portion of the abdomen.  You pass bloody or black tarry stools. MAKE SURE YOU:  Understand these instructions.  Will watch your condition.  Will get help right away if you are not doing  well or get worse.   This information is not intended to replace advice given to you by your health care provider. Make sure you discuss any questions you have with your health care provider.   Document Released: 06/25/2005 Document Revised: 06/06/2015 Document Reviewed: 05/25/2013 Elsevier Interactive Patient Education 2016 Elsevier Inc.  Abdominal Pain, Adult Many things can cause abdominal pain. Usually, abdominal pain is not caused by a disease and  will improve without treatment. It can often be observed and treated at home. Your health care provider will do a physical exam and possibly order blood tests and X-rays to help determine the seriousness of your pain. However, in many cases, more time must pass before a clear cause of the pain can be found. Before that point, your health care provider may not know if you need more testing or further treatment. HOME CARE INSTRUCTIONS Monitor your abdominal pain for any changes. The following actions may help to alleviate any discomfort you are experiencing:  Only take over-the-counter or prescription medicines as directed by your health care provider.  Do not take laxatives unless directed to do so by your health care provider.  Try a clear liquid diet (broth, tea, or water) as directed by your health care provider. Slowly move to a bland diet as tolerated. SEEK MEDICAL CARE IF:  You have unexplained abdominal pain.  You have abdominal pain associated with nausea or diarrhea.  You have pain when you urinate or have a bowel movement.  You experience abdominal pain that wakes you in the night.  You have abdominal pain that is worsened or improved by eating food.  You have abdominal pain that is worsened with eating fatty foods.  You have a fever. SEEK IMMEDIATE MEDICAL CARE IF:  Your pain does not go away within 2 hours.  You keep throwing up (vomiting).  Your pain is felt only in portions of the abdomen, such as the right side or the left lower portion of the abdomen.  You pass bloody or black tarry stools. MAKE SURE YOU:  Understand these instructions.  Will watch your condition.  Will get help right away if you are not doing well or get worse.   This information is not intended to replace advice given to you by your health care provider. Make sure you discuss any questions you have with your health care provider.   Document Released: 06/25/2005 Document Revised: 06/06/2015  Document Reviewed: 05/25/2013 Elsevier Interactive Patient Education 2016 Elsevier Inc.  Gastritis, Adult Gastritis is soreness and puffiness (inflammation) of the lining of the stomach. If you do not get help, gastritis can cause bleeding and sores (ulcers) in the stomach. HOME CARE   Only take medicine as told by your doctor.  If you were given antibiotic medicines, take them as told. Finish the medicines even if you start to feel better.  Drink enough fluids to keep your pee (urine) clear or pale yellow.  Avoid foods and drinks that make your problems worse. Foods you may want to avoid include:  Caffeine or alcohol.  Chocolate.  Mint.  Garlic and onions.  Spicy foods.  Citrus fruits, including oranges, lemons, or limes.  Food containing tomatoes, including sauce, chili, salsa, and pizza.  Fried and fatty foods.  Eat small meals throughout the day instead of large meals. GET HELP RIGHT AWAY IF:   You have black or dark red poop (stools).  You throw up (vomit) blood. It may look like coffee grounds.  You cannot keep  fluids down.  Your belly (abdominal) pain gets worse.  You have a fever.  You do not feel better after 1 week.  You have any other questions or concerns. MAKE SURE YOU:   Understand these instructions.  Will watch your condition.  Will get help right away if you are not doing well or get worse.   This information is not intended to replace advice given to you by your health care provider. Make sure you discuss any questions you have with your health care provider.   Document Released: 03/03/2008 Document Revised: 12/08/2011 Document Reviewed: 10/29/2011 Elsevier Interactive Patient Education [employer].

## 2015-10-25 NOTE — ED Notes (Signed)
Pt reports rt sided abdominal pain that started yesterday. Pt denies N/V/D.

## 2015-10-26 ENCOUNTER — Encounter (HOSPITAL_COMMUNITY): Payer: Self-pay | Admitting: *Deleted

## 2015-10-26 ENCOUNTER — Emergency Department (HOSPITAL_COMMUNITY)
Admission: EM | Admit: 2015-10-26 | Discharge: 2015-10-26 | Disposition: A | Discharge status: Home / Self Care | Payer: Medicaid Other | Attending: Emergency Medicine | Admitting: Emergency Medicine

## 2015-10-26 DIAGNOSIS — X58XXXA Exposure to other specified factors, initial encounter: Secondary | ICD-10-CM | POA: Insufficient documentation

## 2015-10-26 DIAGNOSIS — E669 Obesity, unspecified: Secondary | ICD-10-CM | POA: Insufficient documentation

## 2015-10-26 DIAGNOSIS — R109 Unspecified abdominal pain: Secondary | ICD-10-CM | POA: Diagnosis present

## 2015-10-26 DIAGNOSIS — Y9289 Other specified places as the place of occurrence of the external cause: Secondary | ICD-10-CM | POA: Insufficient documentation

## 2015-10-26 DIAGNOSIS — Z8669 Personal history of other diseases of the nervous system and sense organs: Secondary | ICD-10-CM | POA: Insufficient documentation

## 2015-10-26 DIAGNOSIS — I10 Essential (primary) hypertension: Secondary | ICD-10-CM | POA: Diagnosis not present

## 2015-10-26 DIAGNOSIS — Z8719 Personal history of other diseases of the digestive system: Secondary | ICD-10-CM | POA: Diagnosis not present

## 2015-10-26 DIAGNOSIS — Z79899 Other long term (current) drug therapy: Secondary | ICD-10-CM | POA: Diagnosis not present

## 2015-10-26 DIAGNOSIS — Z8673 Personal history of transient ischemic attack (TIA), and cerebral infarction without residual deficits: Secondary | ICD-10-CM | POA: Diagnosis not present

## 2015-10-26 DIAGNOSIS — S29012A Strain of muscle and tendon of back wall of thorax, initial encounter: Secondary | ICD-10-CM | POA: Insufficient documentation

## 2015-10-26 DIAGNOSIS — Y9389 Activity, other specified: Secondary | ICD-10-CM | POA: Diagnosis not present

## 2015-10-26 DIAGNOSIS — G43909 Migraine, unspecified, not intractable, without status migrainosus: Secondary | ICD-10-CM | POA: Insufficient documentation

## 2015-10-26 DIAGNOSIS — M797 Fibromyalgia: Secondary | ICD-10-CM | POA: Insufficient documentation

## 2015-10-26 DIAGNOSIS — M1990 Unspecified osteoarthritis, unspecified site: Secondary | ICD-10-CM | POA: Insufficient documentation

## 2015-10-26 DIAGNOSIS — Z9049 Acquired absence of other specified parts of digestive tract: Secondary | ICD-10-CM | POA: Diagnosis not present

## 2015-10-26 DIAGNOSIS — Z9889 Other specified postprocedural states: Secondary | ICD-10-CM | POA: Insufficient documentation

## 2015-10-26 DIAGNOSIS — R112 Nausea with vomiting, unspecified: Secondary | ICD-10-CM | POA: Diagnosis not present

## 2015-10-26 DIAGNOSIS — Y998 Other external cause status: Secondary | ICD-10-CM | POA: Insufficient documentation

## 2015-10-26 DIAGNOSIS — Z90710 Acquired absence of both cervix and uterus: Secondary | ICD-10-CM | POA: Diagnosis not present

## 2015-10-26 DIAGNOSIS — S39012A Strain of muscle, fascia and tendon of lower back, initial encounter: Secondary | ICD-10-CM

## 2015-10-26 LAB — COMPREHENSIVE METABOLIC PANEL
ALT: 22 U/L (ref 14–54)
AST: 20 U/L (ref 15–41)
Albumin: 3.9 g/dL (ref 3.5–5.0)
Alkaline Phosphatase: 76 U/L (ref 38–126)
Anion gap: 12 (ref 5–15)
BUN: 7 mg/dL (ref 6–20)
CO2: 29 mmol/L (ref 22–32)
Calcium: 9.3 mg/dL (ref 8.9–10.3)
Chloride: 96 mmol/L — ABNORMAL LOW (ref 101–111)
Creatinine, Ser: 1.12 mg/dL — ABNORMAL HIGH (ref 0.44–1.00)
GFR calc Af Amer: >60 mL/min (ref >60)
GFR calc non Af Amer: 54 mL/min — ABNORMAL LOW (ref >60)
Glucose, Bld: 119 mg/dL — ABNORMAL HIGH (ref 65–99)
Potassium: 3.6 mmol/L (ref 3.5–5.1)
Sodium: 137 mmol/L (ref 135–145)
Total Bilirubin: 0.6 mg/dL (ref 0.3–1.2)
Total Protein: 7.5 g/dL (ref 6.5–8.1)

## 2015-10-26 LAB — CBC
HCT: 43.3 % (ref 36.0–46.0)
Hemoglobin: 14 g/dL (ref 12.0–15.0)
MCH: 28.2 pg (ref 26.0–34.0)
MCHC: 32.3 g/dL (ref 30.0–36.0)
MCV: 87.3 fL (ref 78.0–100.0)
Platelets: 301 10*3/uL (ref 150–400)
RBC: 4.96 MIL/uL (ref 3.87–5.11)
RDW: 15.4 % (ref 11.5–15.5)
WBC: 9.9 10*3/uL (ref 4.0–10.5)

## 2015-10-26 LAB — LIPASE, BLOOD: Lipase: 21 U/L (ref 11–51)

## 2015-10-26 MED ORDER — DIAZEPAM 5 MG PO TABS: 5.0000 mg | ORAL_TABLET | Freq: Three times a day (TID) | ORAL | Status: DC | PRN

## 2015-10-26 MED ORDER — FENTANYL CITRATE (PF) 100 MCG/2ML IJ SOLN
INTRAMUSCULAR | Status: AC
  Filled 2015-10-26: qty 2

## 2015-10-26 MED ORDER — FENTANYL CITRATE (PF) 100 MCG/2ML IJ SOLN
50.0000 ug | Freq: Once | INTRAMUSCULAR | Status: AC
  Administered 2015-10-26: 50 ug via NASAL

## 2015-10-26 MED ORDER — HYDROCODONE-ACETAMINOPHEN 5-325 MG PO TABS
1.0000 | ORAL_TABLET | Freq: Once | ORAL | Status: AC
  Administered 2015-10-26: 1 via ORAL
  Filled 2015-10-26: qty 1

## 2015-10-26 MED ORDER — HYDROCODONE-ACETAMINOPHEN 5-325 MG PO TABS: 1.0000 | ORAL_TABLET | ORAL | Status: DC | PRN

## 2015-10-26 MED ORDER — KETOROLAC TROMETHAMINE 30 MG/ML IJ SOLN
30.0000 mg | Freq: Once | INTRAMUSCULAR | Status: AC
  Administered 2015-10-26: 30 mg via INTRAVENOUS
  Filled 2015-10-26: qty 1

## 2015-10-26 NOTE — ED Provider Notes (Signed)
CSN: [id number]     Arrival date & time 10/26/15  1829 History   First MD Initiated Contact with Patient 10/26/15 2012     Chief Complaint  Patient presents with  . Abdominal Pain  . Flank Pain     (Consider location/radiation/quality/duration/timing/severity/associated sxs/prior Treatment) HPI Comments: 55yo F w/ PMH including HTN, HLD, obesity, CVA, low back pain who p/w R flank pain. Pt was seen here yesterday for right flank and side pain that began 2 days ago. The pain is sharp, constant, severe, and worse with any movement. She has had some nausea and vomiting today. No diarrhea. Last bowel movement was yesterday and was normal. She had a CT scan which was unremarkable yesterday but she returns today because her pain is worse. No urinary symptoms. No fevers, cough/cold symptoms, chest pain, or shortness of breath. She does have a history of back pain and states that the pain does radiate around to her right mid back.  Patient is a 55 y.o. female presenting with abdominal pain and flank pain. The history is provided by the patient.  Abdominal Pain Flank Pain Associated symptoms include abdominal pain.    Past Medical History  Diagnosis Date  . Hypertension   . Hypercholesteremia   . Lower back pain   . Nontoxic uninodular goiter     sees dr vollmer at [facility]  . Calculus of gallbladder without mention of cholecystitis or obstruction   . Arthritis   . Stroke ([place]) 2000  . Migraine   . Sleep apnea     STOPBANG=5  . Fibromyalgia   . Obesity   . Goiter    Past Surgical History  Procedure Laterality Date  . Knee arthroscopy  one 1995 and 1 in 1997    both knees done  . Abdominal hysterectomy    . Cesarean section  yrs ago    done x 2  . Surgery for endometriosis  yrs ago  . Thryoid biopsy  December 01, 2011     [facility]  . Cholecystectomy  01/05/2012    Procedure: LAPAROSCOPIC CHOLECYSTECTOMY WITH INTRAOPERATIVE CHOLANGIOGRAM;  Surgeon: Pedro Earls, MD;  Location: [facility];  Service: General;  Laterality: N/A;  . Colonoscopy  10/08/2012    Procedure: COLONOSCOPY;  Surgeon: Beryle Beams, MD;  Location: [facility];  Service: Endoscopy;  Laterality: N/A;   Family History  Problem Relation Age of Onset  . Cancer Brother     colon and lung  . Cancer Maternal Grandmother     colon  . Diabetes Mother   . Hypertension Mother   . Transient ischemic attack Mother   . Seizures Mother    Social History  Substance Use Topics  . Smoking status: Never Smoker   . Smokeless tobacco: Never Used  . Alcohol Use: 0.0 oz/week    0 Standard drinks or equivalent per week     Comment: Occasionally   OB History    No data available     Review of Systems  Gastrointestinal: Positive for abdominal pain.  Genitourinary: Positive for flank pain.   10 Systems reviewed and are negative for acute change except as noted in the HPI.    Allergies  Shrimp and Naproxen  Home Medications   Prior to Admission medications   Medication Sig Start Date End Date Taking? Authorizing Provider  amLODipine (NORVASC) 10 MG tablet Take 10 mg by mouth daily. 11/29/14  Yes Historical Provider, MD  Cholecalciferol 50000 units capsule Take 50,000 Units  by mouth every Friday.   Yes Historical Provider, MD  DULoxetine (CYMBALTA) 30 MG capsule Take 3 capsules (90 mg total) by mouth daily. Patient taking differently: Take 60 mg by mouth 2 (two) times daily.  08/01/15  Yes Marcial Pacas, MD  loratadine (CLARITIN) 10 MG tablet Take 10 mg by mouth daily.    Yes Historical Provider, MD  metoprolol succinate (TOPROL-XL) 100 MG 24 hr tablet Take 1 tablet (100 mg total) by mouth daily. Take with or immediately following a meal. Patient taking differently: Take 50-100 mg by mouth See admin instructions. Pt takes 100mg  in am and 50mg  in pm 03/13/14  Yes Charolette Forward, MD  nitroGLYCERIN (NITROSTAT) 0.4 MG SL tablet Place 1 tablet (0.4 mg total) under the tongue every 5 (five) minutes x 3 doses as needed  for chest pain. Patient taking differently: Place 0.4 mg under the tongue every 5 (five) minutes as needed for chest pain. Maximum 3 doses 03/13/14  Yes Charolette Forward, MD  oxyCODONE-acetaminophen (PERCOCET) 5-325 MG tablet Take 1-2 tablets by mouth every 4 (four) hours as needed. 10/16/15  Yes [name], MD  pantoprazole (PROTONIX) 40 MG tablet Take 40 mg by mouth 2 (two) times daily.    Yes Historical Provider, MD  pregabalin (LYRICA) 75 MG capsule Take 75-150 mg by mouth 2 (two) times daily. Take 1 tablet (75 mg) every morning and 2 tablets (150 mg) at bedtime   Yes Historical Provider, MD  sucralfate (CARAFATE) 1 g tablet Take 1 tablet (1 g total) by mouth 4 (four) times daily. 10/25/15  Yes Tanna Furry, MD  topiramate (TOPAMAX) 100 MG tablet Take 100 mg by mouth 2 (two) times daily. 01/28/15  Yes Historical Provider, MD  traMADol (ULTRAM) 50 MG tablet Take 1 tablet (50 mg total) by mouth every 6 (six) hours as needed. 10/25/15  Yes Tanna Furry, MD  valsartan-hydrochlorothiazide (DIOVAN-HCT) 160-25 MG tablet Take 1 tablet by mouth daily. 09/19/15  Yes Historical Provider, MD  diazepam (VALIUM) 5 MG tablet Take 1 tablet (5 mg total) by mouth every 8 (eight) hours as needed for muscle spasms. 10/26/15   Sharlett Iles, MD  HYDROcodone-acetaminophen (NORCO/VICODIN) 5-325 MG tablet Take 1 tablet by mouth every 4 (four) hours as needed for severe pain. 10/26/15   Wenda Overland Zerenity Bowron, MD   BP 177/101 mmHg  Pulse 98  Temp(Src) 97.5 F (36.4 C) (Oral)  Resp 21  SpO2 98% Physical Exam  Constitutional: She is oriented to person, place, and time. She appears well-developed and well-nourished. She appears distressed.  HENT:  Head: Normocephalic and atraumatic.  Moist mucous membranes  Eyes: Conjunctivae are normal. Pupils are equal, round, and reactive to light.  Neck: Neck supple.  Cardiovascular: Normal rate, regular rhythm and normal heart sounds.   No murmur heard. Pulmonary/Chest: Effort  normal and breath sounds normal.  Abdominal: Soft. Bowel sounds are normal. She exhibits no distension. There is no rebound and no guarding.  TTP R lateral upper abdomen around to side  Musculoskeletal: She exhibits no edema.  Tenderness to light palpation of R paraspinal muscles from scapulae to lower thoracic back and along costophrenic angle on R  Neurological: She is alert and oriented to person, place, and time.  Fluent speech  Skin: Skin is warm and dry.  Psychiatric: Judgment normal.  distressed  Nursing note and vitals reviewed.   ED Course  Procedures (including critical care time) Labs Review Labs Reviewed  COMPREHENSIVE METABOLIC PANEL - Abnormal; Notable for the  following:    Chloride 96 (*)    Glucose, Bld 119 (*)    Creatinine, Ser 1.12 (*)    GFR calc non Af Amer 54 (*)    All other components within normal limits  CBC  LIPASE, BLOOD    Imaging Review Ct Abdomen Pelvis W Contrast  10/25/2015  CLINICAL DATA:  Right-sided pain since yesterday. EXAM: CT ABDOMEN AND PELVIS WITH CONTRAST TECHNIQUE: Multidetector CT imaging of the abdomen and pelvis was performed using the standard protocol following bolus administration of intravenous contrast. CONTRAST:  144mL OMNIPAQUE IOHEXOL 300 MG/ML  SOLN COMPARISON:  October 16, 2015 FINDINGS: Normal lung bases. No free air or free fluid. There is a fat containing umbilical hernia. Scar tissue is seen in the deep subcutaneous tissues at the midline of the lower abdomen and pelvis. No other ventral wall defects are seen. The patient is status post cholecystectomy. The liver, portal vein, spleen, adrenal glands, and pancreas are normal. Bilateral renal cysts are stable. No suspicious solid masses. No hydronephrosis, perinephric stranding, or stones. No ureterectasis or ureteral stones. The abdominal aorta is normal in caliber with no dissection. No adenopathy is identified. The stomach and small bowel are within normal limits. The colon  and appendix are normal as well. The patient is status post hysterectomy. The right ovary appears to been retained with no mass. The bladder is normal. No adenopathy or mass in the pelvis. No acute bony abnormalities are seen. There are mild degenerative changes in the spine. Delayed images demonstrate no filling defects within the upper renal collecting systems. IMPRESSION: 1. No acute abnormalities.  See above for incidental findings. Electronically Signed   By: Dorise Bullion III M.D   On: 10/25/2015 11:30   I have personally reviewed and evaluated these lab results as part of my medical decision-making.   EKG Interpretation None     Medications  fentaNYL (SUBLIMAZE) injection 50 mcg (50 mcg Nasal Given 10/26/15 1849)  ketorolac (TORADOL) 30 MG/ML injection 30 mg (30 mg Intravenous Given 10/26/15 2136)  HYDROcodone-acetaminophen (NORCO/VICODIN) 5-325 MG per tablet 1 tablet (1 tablet Oral Given 10/26/15 2136)    MDM   Final diagnoses:  Back strain, initial encounter   Pt p/w ongoing right side radiating into her right back pain despite a normal CT scan yesterday. On exam, she was rolling around in bed and in distress. She was hypertensive at 192/99. She had severe tenderness to light palpation of right paraspinal muscles and around right flank and side. Above labs including CMP are unremarkable and unchanged from yesterday. Given normal CT and normal UA yesterday, I do not feel she needs any further imaging. Because of her exquisite paraspinal muscle tenderness, I suspect back strain with muscle spasm. Gave the patient Norco and Toradol and on reexamination she was comfortable. I discussed supportive care instructions including heat therapy, early range of motion, NSAID course, and muscle relaxants as well as PCP follow-up for physical therapy referral. The patient has no symptoms to suggest cauda equina therefore I do not feel she needs any back imaging. The patient and her husband voiced  understanding of plan as well as return precautions and patient discharged in satisfactory condition.   Sharlett Iles, MD 10/27/15 [phone]

## 2015-10-26 NOTE — ED Notes (Signed)
Pt reports was seen here yesterday for same pain but today it is worse

## 2015-10-26 NOTE — ED Notes (Signed)
Pt reports being seen here yesterday for right side pain that has become more severe today, radiates into her back. Denies any urinary symptoms but reports n/v today.

## 2015-10-31 ENCOUNTER — Ambulatory Visit: Payer: Medicaid Other | Admitting: Neurology

## 2015-11-14 ENCOUNTER — Encounter (HOSPITAL_COMMUNITY): Payer: Self-pay | Admitting: Vascular Surgery

## 2015-11-14 DIAGNOSIS — E669 Obesity, unspecified: Secondary | ICD-10-CM | POA: Insufficient documentation

## 2015-11-14 DIAGNOSIS — Z8673 Personal history of transient ischemic attack (TIA), and cerebral infarction without residual deficits: Secondary | ICD-10-CM | POA: Diagnosis not present

## 2015-11-14 DIAGNOSIS — I10 Essential (primary) hypertension: Secondary | ICD-10-CM | POA: Insufficient documentation

## 2015-11-14 DIAGNOSIS — R109 Unspecified abdominal pain: Secondary | ICD-10-CM | POA: Diagnosis not present

## 2015-11-14 LAB — URINALYSIS, ROUTINE W REFLEX MICROSCOPIC
Bilirubin Urine: NEGATIVE
Glucose, UA: NEGATIVE mg/dL
Hgb urine dipstick: NEGATIVE
Ketones, ur: NEGATIVE mg/dL
Leukocytes, UA: NEGATIVE
Nitrite: NEGATIVE
Protein, ur: NEGATIVE mg/dL
Specific Gravity, Urine: 1.016 (ref 1.005–1.030)
pH: 5.5 (ref 5.0–8.0)

## 2015-11-14 NOTE — ED Notes (Signed)
Pt reports to the ED for eval of right flank pain. Pt was seen by her PCP and was told it could possibly be shingles but she never developed a rash. Pt denies any urinary symptoms or problems, N/V/D, fevers, or chills. Pt A&OX4, resp e/u, and skin warm and dry.

## 2015-11-15 ENCOUNTER — Emergency Department (HOSPITAL_COMMUNITY)
Admission: EM | Admit: 2015-11-15 | Discharge: 2015-11-15 | Disposition: A | Discharge status: Home / Self Care | Payer: Medicaid Other | Attending: Emergency Medicine | Admitting: Emergency Medicine

## 2015-11-15 NOTE — ED Notes (Signed)
Patient called for room assignment without reply.

## 2015-11-22 ENCOUNTER — Telehealth: Payer: Self-pay | Admitting: Neurology

## 2015-11-22 NOTE — Telephone Encounter (Signed)
Spoke to patient - she has been in PT and would like to be in pain management - offered to ask Dr. Krista Blue for a referral but she said she would be going back to her PCP.  Her follow up here was canceled.

## 2015-11-22 NOTE — Telephone Encounter (Signed)
Patient called requesting pain management for back and fibromyalgia, wants to know if Dr. Krista Blue could manage or if she needs to call PCP. Has appointment with Dr. Krista Blue 11/29/15, doesn't feel like she can wait until then to discuss this, is having a lot of pain with her back.  Please call to advise.

## 2015-11-29 ENCOUNTER — Ambulatory Visit: Payer: Medicaid Other | Admitting: Neurology

## 2015-12-25 ENCOUNTER — Other Ambulatory Visit: Payer: Self-pay | Admitting: Family Medicine

## 2015-12-25 DIAGNOSIS — M545 Low back pain: Secondary | ICD-10-CM

## 2015-12-29 ENCOUNTER — Ambulatory Visit
Admission: RE | Admit: 2015-12-29 | Discharge: 2015-12-29 | Disposition: A | Discharge status: Home / Self Care | Payer: Medicaid Other | Source: Ambulatory Visit | Attending: Family Medicine | Admitting: Family Medicine

## 2015-12-29 DIAGNOSIS — M545 Low back pain: Secondary | ICD-10-CM

## 2016-01-11 ENCOUNTER — Encounter (HOSPITAL_COMMUNITY): Payer: Self-pay

## 2016-01-11 DIAGNOSIS — Y9289 Other specified places as the place of occurrence of the external cause: Secondary | ICD-10-CM | POA: Diagnosis not present

## 2016-01-11 DIAGNOSIS — Z8673 Personal history of transient ischemic attack (TIA), and cerebral infarction without residual deficits: Secondary | ICD-10-CM | POA: Insufficient documentation

## 2016-01-11 DIAGNOSIS — I10 Essential (primary) hypertension: Secondary | ICD-10-CM | POA: Diagnosis not present

## 2016-01-11 DIAGNOSIS — Z79899 Other long term (current) drug therapy: Secondary | ICD-10-CM | POA: Diagnosis not present

## 2016-01-11 DIAGNOSIS — Z87448 Personal history of other diseases of urinary system: Secondary | ICD-10-CM | POA: Insufficient documentation

## 2016-01-11 DIAGNOSIS — Y998 Other external cause status: Secondary | ICD-10-CM | POA: Diagnosis not present

## 2016-01-11 DIAGNOSIS — M797 Fibromyalgia: Secondary | ICD-10-CM | POA: Insufficient documentation

## 2016-01-11 DIAGNOSIS — G43909 Migraine, unspecified, not intractable, without status migrainosus: Secondary | ICD-10-CM | POA: Diagnosis not present

## 2016-01-11 DIAGNOSIS — Y9389 Activity, other specified: Secondary | ICD-10-CM | POA: Insufficient documentation

## 2016-01-11 DIAGNOSIS — M1990 Unspecified osteoarthritis, unspecified site: Secondary | ICD-10-CM | POA: Diagnosis not present

## 2016-01-11 DIAGNOSIS — S3992XA Unspecified injury of lower back, initial encounter: Secondary | ICD-10-CM | POA: Insufficient documentation

## 2016-01-11 DIAGNOSIS — W1839XA Other fall on same level, initial encounter: Secondary | ICD-10-CM | POA: Insufficient documentation

## 2016-01-11 NOTE — ED Notes (Signed)
Pt reports lower back pain into coccyx area, pain when sitting. Pain onset 10 days ago. She denies recent fall or injury. She called her doctor on Tuesday and was told to use ice / heat pads and ibuprofen but it has not helped. Hx of fibromyalgia.

## 2016-01-12 ENCOUNTER — Emergency Department (HOSPITAL_COMMUNITY): Payer: Medicaid Other

## 2016-01-12 ENCOUNTER — Emergency Department (HOSPITAL_COMMUNITY)
Admission: EM | Admit: 2016-01-12 | Discharge: 2016-01-12 | Disposition: A | Discharge status: Home / Self Care | Payer: Medicaid Other | Attending: Emergency Medicine | Admitting: Emergency Medicine

## 2016-01-12 DIAGNOSIS — R52 Pain, unspecified: Secondary | ICD-10-CM

## 2016-01-12 DIAGNOSIS — W19XXXA Unspecified fall, initial encounter: Secondary | ICD-10-CM

## 2016-01-12 DIAGNOSIS — M545 Low back pain, unspecified: Secondary | ICD-10-CM

## 2016-01-12 MED ORDER — KETOROLAC TROMETHAMINE 30 MG/ML IJ SOLN
30.0000 mg | Freq: Once | INTRAMUSCULAR | Status: AC
  Administered 2016-01-12: 30 mg via INTRAMUSCULAR
  Filled 2016-01-12: qty 1

## 2016-01-12 MED ORDER — OXYCODONE-ACETAMINOPHEN 5-325 MG PO TABS
2.0000 | ORAL_TABLET | Freq: Once | ORAL | Status: AC
  Administered 2016-01-12: 2 via ORAL
  Filled 2016-01-12: qty 2

## 2016-01-12 MED ORDER — METHYLPREDNISOLONE 4 MG PO TBPK: ORAL_TABLET | ORAL | Status: DC

## 2016-01-12 NOTE — Discharge Instructions (Signed)

## 2016-01-12 NOTE — ED Provider Notes (Signed)
CSN: [id number]     Arrival date & time 01/11/16  2303 History  By signing my name below, I, Jodi Bowman, attest that this documentation has been prepared under the direction and in the presence of [name], MD. Electronically Signed: Hansel Bowman, ED Scribe. 01/12/2016. 1:20 AM.    Chief Complaint  Patient presents with  . Back Pain   The history is provided by the patient. No language interpreter was used.   HPI Comments: Jodi Bowman is a 55 y.o. female with h/o HTN, fibromyalgia on PRN tizanidine and 5 mg oxycodone who presents to the Emergency Department complaining of moderate 10/10 lower back pain with radiation to sacrum and bilateral hips for 10 days. She notes a recent fall onto her buttock 3 weeks ago, but no other trauma or injury. Pt states she has tried ibuprofen, heat and ice with no relief of pain. She also notes no relief of pain with her prescribed oxycodone or tizanidine.  She states her pain is worsened with movement, sitting and ambulation. She notes recent steroid use 3 months ago, but none since. Denies pain with BM or urination. Pt is ambulatory with minimal difficulty. No h/o cancer, IVDU, back surgery. Denies bowel or bladder incontinence, saddle anesthesia, fever, cough, abdominal pain, dysuria, hematuria, frequency, rash. Denies numbness, focal weakness or paresthesia of the lower extremities.    Past Medical History  Diagnosis Date  . Hypertension   . Hypercholesteremia   . Lower back pain   . Nontoxic uninodular goiter     sees dr vollmer at [facility]  . Calculus of gallbladder without mention of cholecystitis or obstruction   . Arthritis   . Stroke ([place]) 2000  . Migraine   . Sleep apnea     STOPBANG=5  . Fibromyalgia   . Obesity   . Goiter    Past Surgical History  Procedure Laterality Date  . Knee arthroscopy  one 1995 and 1 in 1997    both knees done  . Abdominal hysterectomy    . Cesarean section  yrs ago    done x 2  . Surgery for  endometriosis  yrs ago  . Thryoid biopsy  December 01, 2011     at [facility]  . Cholecystectomy  01/05/2012    Procedure: LAPAROSCOPIC CHOLECYSTECTOMY WITH INTRAOPERATIVE CHOLANGIOGRAM;  Surgeon: Pedro Earls, MD;  Location: [facility];  Service: General;  Laterality: N/A;  . Colonoscopy  10/08/2012    Procedure: COLONOSCOPY;  Surgeon: Beryle Beams, MD;  Location: [facility];  Service: Endoscopy;  Laterality: N/A;   Family History  Problem Relation Age of Onset  . Cancer Brother     colon and lung  . Cancer Maternal Grandmother     colon  . Diabetes Mother   . Hypertension Mother   . Transient ischemic attack Mother   . Seizures Mother    Social History  Substance Use Topics  . Smoking status: Never Smoker   . Smokeless tobacco: Never Used  . Alcohol Use: 0.0 oz/week    0 Standard drinks or equivalent per week     Comment: Occasionally   OB History    No data available     Review of Systems  Constitutional: Negative for fever.  Respiratory: Negative for cough.   Gastrointestinal: Negative for abdominal pain.  Genitourinary: Negative for dysuria, frequency and hematuria.  Musculoskeletal: Positive for back pain and arthralgias.  Skin: Negative for rash.  Neurological: Negative for weakness and numbness.  All other systems reviewed and are negative.  Allergies  Shrimp and Naproxen  Home Medications   Prior to Admission medications   Medication Sig Start Date End Date Taking? Authorizing Provider  amLODipine (NORVASC) 10 MG tablet Take 10 mg by mouth daily. 11/29/14  Yes Historical Provider, MD  Cholecalciferol 50000 units capsule Take 50,000 Units by mouth every Friday.   Yes Historical Provider, MD  DULoxetine (CYMBALTA) 30 MG capsule Take 3 capsules (90 mg total) by mouth daily. Patient taking differently: Take 30-60 mg by mouth 2 (two) times daily. Take 60mg  in the morning and 30mg  in the evening 08/01/15  Yes Marcial Pacas, MD  loratadine (CLARITIN) 10 MG tablet Take 10 mg by  mouth daily.    Yes Historical Provider, MD  metoprolol succinate (TOPROL-XL) 100 MG 24 hr tablet Take 1 tablet (100 mg total) by mouth daily. Take with or immediately following a meal. Patient taking differently: Take 50-100 mg by mouth See admin instructions. Pt takes 100mg  in am and 50mg  in pm 03/13/14  Yes Charolette Forward, MD  nitroGLYCERIN (NITROSTAT) 0.4 MG SL tablet Place 1 tablet (0.4 mg total) under the tongue every 5 (five) minutes x 3 doses as needed for chest pain. Patient taking differently: Place 0.4 mg under the tongue every 5 (five) minutes as needed for chest pain. Maximum 3 doses 03/13/14  Yes Charolette Forward, MD  pantoprazole (PROTONIX) 40 MG tablet Take 40 mg by mouth 2 (two) times daily.    Yes Historical Provider, MD  pregabalin (LYRICA) 75 MG capsule Take 75-150 mg by mouth 2 (two) times daily. Take 2 tablets (150 mg) every morning and 1 tablet (75 mg) at bedtime   Yes Historical Provider, MD  sucralfate (CARAFATE) 1 g tablet Take 1 tablet (1 g total) by mouth 4 (four) times daily. 10/25/15  Yes Tanna Furry, MD  topiramate (TOPAMAX) 100 MG tablet Take 100 mg by mouth 2 (two) times daily. 01/28/15  Yes Historical Provider, MD  valsartan-hydrochlorothiazide (DIOVAN-HCT) 160-25 MG tablet Take 1 tablet by mouth daily. 09/19/15  Yes Historical Provider, MD  methylPREDNISolone (MEDROL DOSEPAK) 4 MG TBPK tablet Take as directed on packet 01/12/16   [name], MD   BP 185/116 mmHg  Pulse 95  Temp(Src) 98.7 F (37.1 C) (Oral)  Resp 16  SpO2 98% Physical Exam  Constitutional: She is oriented to person, place, and time. She appears well-developed and well-nourished.  Morbidly obese  HENT:  Head: Normocephalic and atraumatic.  Cardiovascular: Normal rate, regular rhythm and normal heart sounds.   Pulmonary/Chest: Effort normal. No respiratory distress.  Musculoskeletal: She exhibits tenderness.  Tenderness to palpation lower lumbar spine, no step-off or deformity, no tenderness  noted over the coccyx  Neurological: She is alert and oriented to person, place, and time.  Normal range of motion bilateral lower extremities, normal strength, no clonus, normal reflexes  Skin: Skin is warm and dry.  No fluctuance or induration at the gluteal cleft  Psychiatric: She has a normal mood and affect.  Nursing note and vitals reviewed.   ED Course  Procedures (including critical care time) DIAGNOSTIC STUDIES: Oxygen Saturation is 98% on RA, normal by my interpretation.    COORDINATION OF CARE: 1:03 AM Discussed treatment plan with pt at bedside which includes XR and pt agreed to plan.   Labs Review Labs Reviewed - No data to display  Imaging Review Dg Lumbar Spine Complete  01/12/2016  CLINICAL DATA:  [place] 3 weeks ago.  Persistent low  back pain. EXAM: LUMBAR SPINE - COMPLETE 4+ VIEW COMPARISON:  09/06/2015 FINDINGS: There is mild chronic anterior wedging of T12 and L1. The lumbar vertebrae are otherwise normal in height. There is no acute fracture. There is no bone lesion or bony destruction. No significant degenerative changes are evident. IMPRESSION: Negative. Electronically Signed   By: Andreas Newport M.D.   On: 01/12/2016 01:56   Dg Hip Unilat With Pelvis 2-3 Views Left  01/12/2016  CLINICAL DATA:  [place] 3 weeks ago.  Persistent left hip pain. EXAM: DG HIP (WITH OR WITHOUT PELVIS) 2-3V LEFT COMPARISON:  None. FINDINGS: There is no evidence of hip fracture or dislocation. There is no evidence of arthropathy or other focal bone abnormality. IMPRESSION: Negative. Electronically Signed   By: Andreas Newport M.D.   On: 01/12/2016 01:56   I have personally reviewed and evaluated these images and lab results as part of my medical decision-making.   EKG Interpretation None      MDM   Final diagnoses:  Midline low back pain without sciatica  Fall, initial encounter    Patient presents with subacute pain. Onset of pain approximately 10 days ago. States that she fell  2-3 weeks ago. No red flags of back pain. She is neurologically intact. She is obese. Patient given her home dose of oxycodone as well as Toradol and x-rays were  obtained and without evidence of traumatic injury. Discussed with patient that I would not be able to prescribe her any further narcotics. Patient will be placed on a Medrol Dosepak for inflammation.  After history, exam, and medical workup I feel the patient has been appropriately medically screened and is safe for discharge home. Pertinent diagnoses were discussed with the patient. Patient was given return precautions.  I personally performed the services described in this documentation, which was scribed in my presence. The recorded information has been reviewed and is accurate.   [name], MD 01/12/16 [phone]

## 2016-01-12 NOTE — ED Notes (Signed)
Patient presents via wheelchair from the waiting area.  Patient states she is hurting in her tailbone.  Family states she fell 2-3 weeks ago but no c/o pain at that time.  Denies leg numbness

## 2016-01-12 NOTE — ED Notes (Signed)
Patient left at this time with all belongings. 

## 2016-01-12 NOTE — ED Notes (Signed)
EDP at bedside  

## 2016-01-13 IMAGING — DX DG CHEST 2V
2 series · 2 of 2 positions shown · non-contrast
Comparison: 10/09/2014

CLINICAL DATA: Chest pain.  Shortness of breath.

EXAM:
CHEST  2 VIEW

[chest pa]
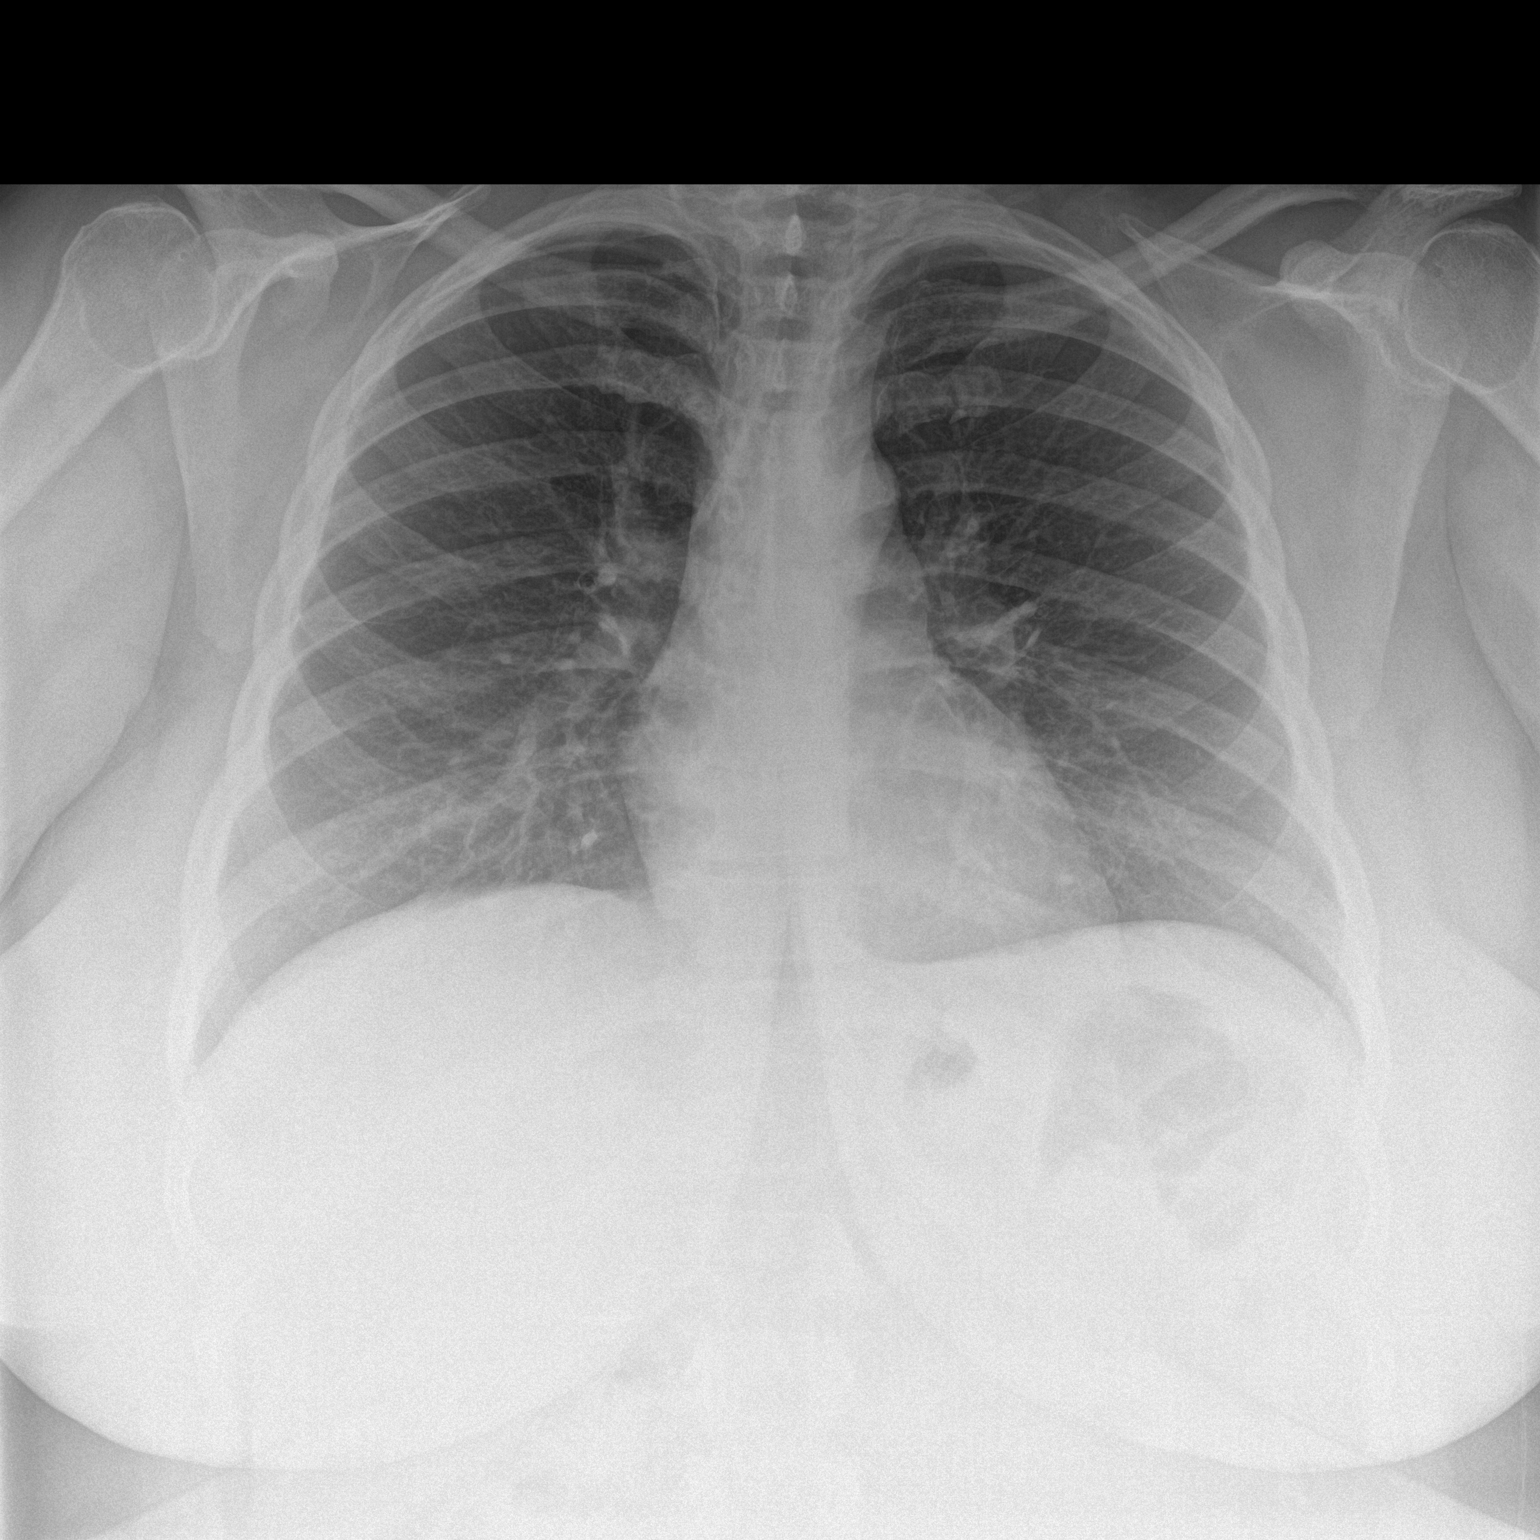

[chest lat]
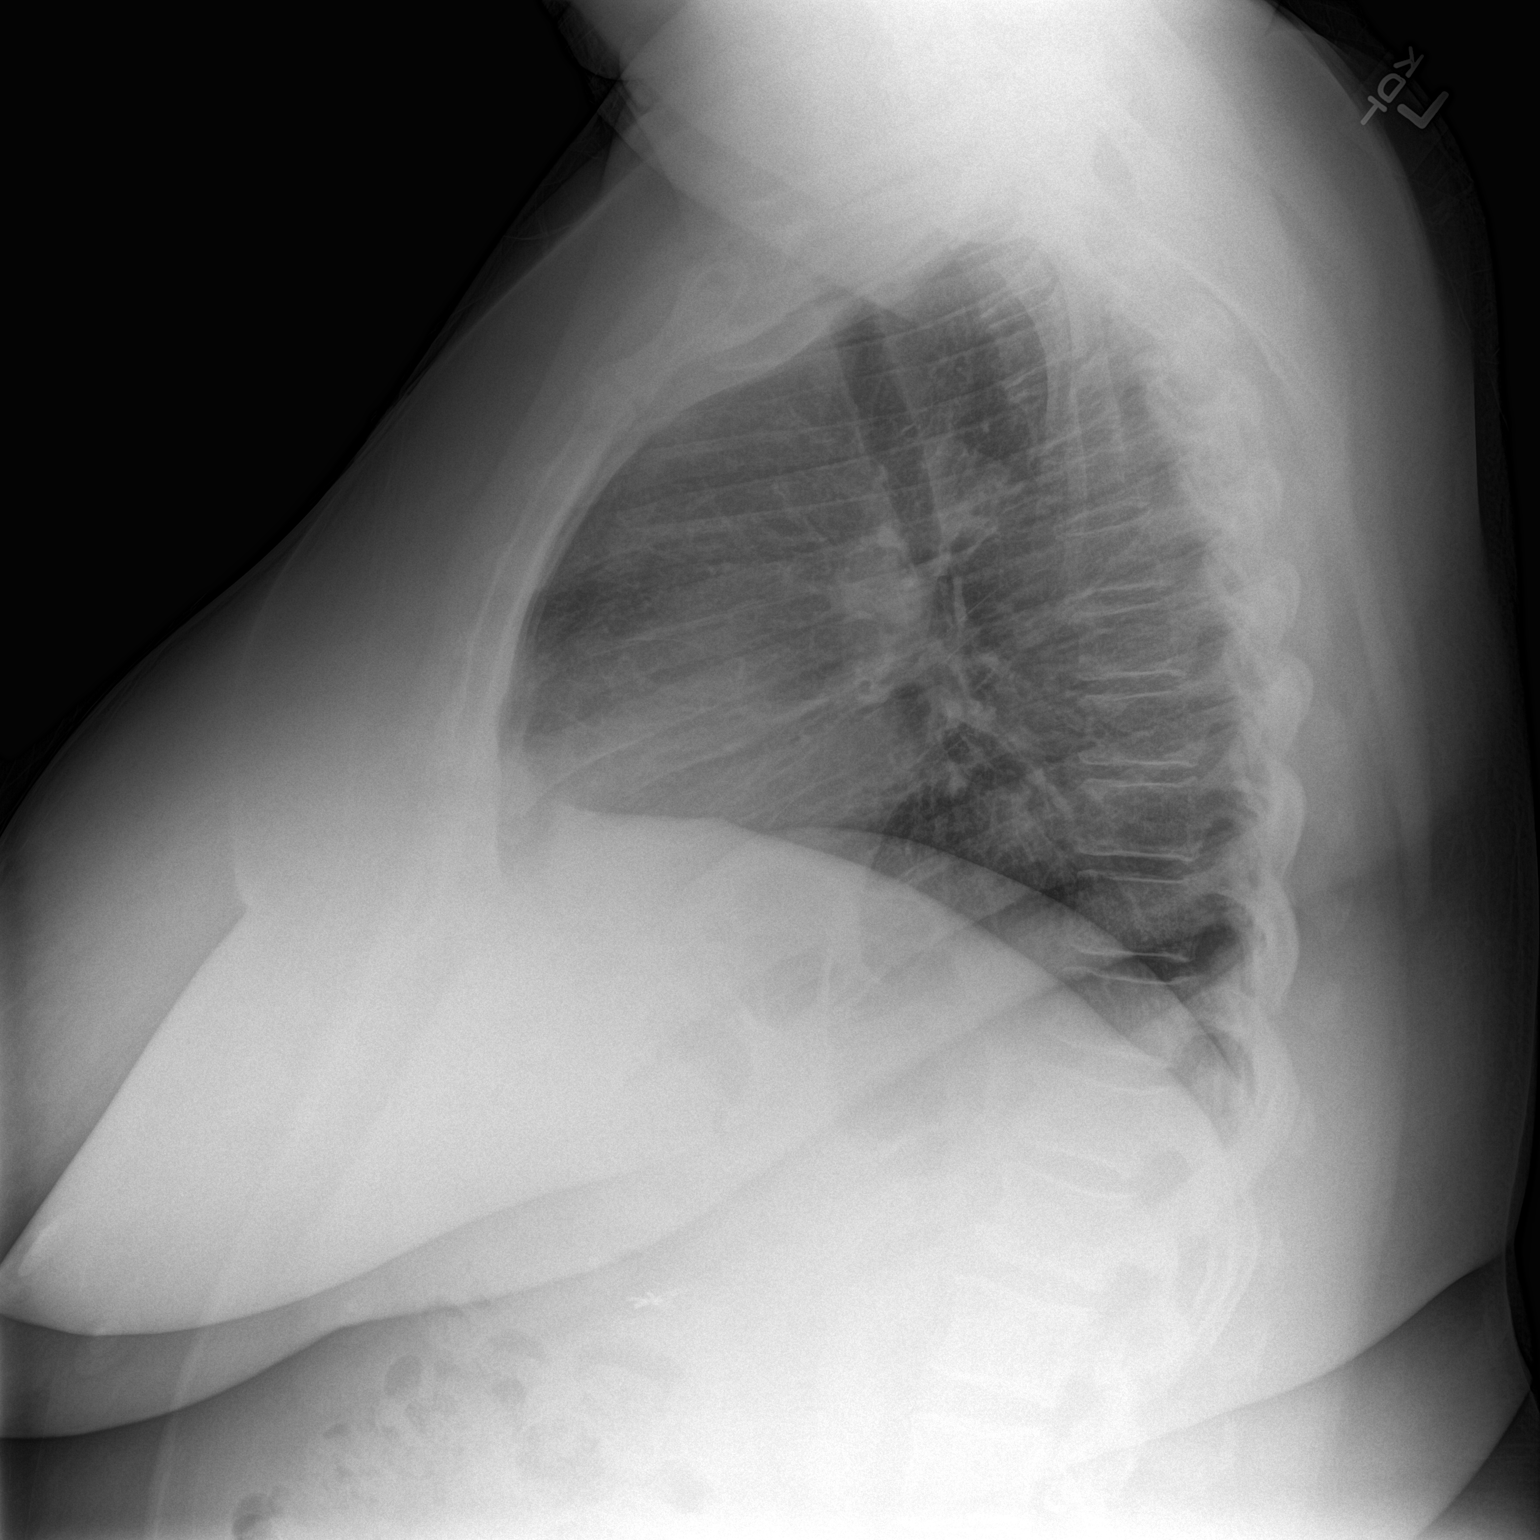

[2 of 2 positions shown; findings below may reference images not displayed]

FINDINGS: The heart size and mediastinal contours are within normal limits.
Both lungs are clear. The visualized skeletal structures are
unremarkable.
IMPRESSION: No active cardiopulmonary disease.

## 2016-05-06 ENCOUNTER — Emergency Department (HOSPITAL_COMMUNITY)
Admission: EM | Admit: 2016-05-06 | Discharge: 2016-05-06 | Disposition: A | Discharge status: Home / Self Care | Payer: Medicaid Other | Attending: Emergency Medicine | Admitting: Emergency Medicine

## 2016-05-06 ENCOUNTER — Encounter (HOSPITAL_COMMUNITY): Payer: Self-pay | Admitting: Emergency Medicine

## 2016-05-06 DIAGNOSIS — I10 Essential (primary) hypertension: Secondary | ICD-10-CM | POA: Diagnosis not present

## 2016-05-06 DIAGNOSIS — G43909 Migraine, unspecified, not intractable, without status migrainosus: Secondary | ICD-10-CM | POA: Diagnosis present

## 2016-05-06 DIAGNOSIS — Z8673 Personal history of transient ischemic attack (TIA), and cerebral infarction without residual deficits: Secondary | ICD-10-CM | POA: Diagnosis not present

## 2016-05-06 DIAGNOSIS — G43109 Migraine with aura, not intractable, without status migrainosus: Secondary | ICD-10-CM

## 2016-05-06 DIAGNOSIS — Z79899 Other long term (current) drug therapy: Secondary | ICD-10-CM | POA: Diagnosis not present

## 2016-05-06 LAB — BASIC METABOLIC PANEL
Anion gap: 5 (ref 5–15)
BUN: 15 mg/dL (ref 6–20)
CO2: 27 mmol/L (ref 22–32)
Calcium: 9.2 mg/dL (ref 8.9–10.3)
Chloride: 109 mmol/L (ref 101–111)
Creatinine, Ser: 0.86 mg/dL (ref 0.44–1.00)
GFR calc Af Amer: >60 mL/min (ref >60)
GFR calc non Af Amer: >60 mL/min (ref >60)
Glucose, Bld: 116 mg/dL — ABNORMAL HIGH (ref 65–99)
Potassium: 3.4 mmol/L — ABNORMAL LOW (ref 3.5–5.1)
Sodium: 141 mmol/L (ref 135–145)

## 2016-05-06 MED ORDER — AMLODIPINE BESYLATE 5 MG PO TABS
10.0000 mg | ORAL_TABLET | Freq: Once | ORAL | Status: AC
  Administered 2016-05-06: 10 mg via ORAL
  Filled 2016-05-06: qty 2

## 2016-05-06 MED ORDER — PROCHLORPERAZINE EDISYLATE 5 MG/ML IJ SOLN
10.0000 mg | Freq: Once | INTRAMUSCULAR | Status: AC
  Administered 2016-05-06: 10 mg via INTRAVENOUS
  Filled 2016-05-06: qty 2

## 2016-05-06 MED ORDER — TOPIRAMATE 25 MG PO TABS
100.0000 mg | ORAL_TABLET | Freq: Once | ORAL | Status: AC
  Administered 2016-05-06: 100 mg via ORAL
  Filled 2016-05-06: qty 4

## 2016-05-06 MED ORDER — DIPHENHYDRAMINE HCL 50 MG/ML IJ SOLN
25.0000 mg | Freq: Once | INTRAMUSCULAR | Status: AC
  Administered 2016-05-06: 25 mg via INTRAVENOUS
  Filled 2016-05-06: qty 1

## 2016-05-06 MED ORDER — SODIUM CHLORIDE 0.9 % IV BOLUS (SEPSIS)
1000.0000 mL | Freq: Once | INTRAVENOUS | Status: AC
  Administered 2016-05-06: 1000 mL via INTRAVENOUS

## 2016-05-06 MED ORDER — DEXAMETHASONE SODIUM PHOSPHATE 10 MG/ML IJ SOLN
10.0000 mg | Freq: Once | INTRAMUSCULAR | Status: AC
  Administered 2016-05-06: 10 mg via INTRAVENOUS
  Filled 2016-05-06: qty 1

## 2016-05-06 MED ORDER — METOPROLOL TARTRATE 25 MG PO TABS
100.0000 mg | ORAL_TABLET | Freq: Once | ORAL | Status: AC
  Administered 2016-05-06: 100 mg via ORAL
  Filled 2016-05-06: qty 4

## 2016-05-06 NOTE — ED Triage Notes (Signed)
Pt. Stated, I've got a migraine headache that started during the night.

## 2016-05-06 NOTE — ED Notes (Signed)
Pt tolerating water and crackers with no problem

## 2016-05-06 NOTE — ED Provider Notes (Signed)
[place] DEPT Provider Note   CSN: [id number] Arrival date & time: 05/06/16  [id number]  First Provider Contact:  None       History   Chief Complaint Chief Complaint  Patient presents with  . Migraine    HPI Jodi Bowman is a 55 y.o. female.  The history is provided by the patient and a relative. No language interpreter was used.  Migraine  This is a new problem. The current episode started 6 to 12 hours ago. The problem occurs constantly. The problem has not changed since onset.Associated symptoms include headaches. Pertinent negatives include no chest pain, no abdominal pain and no shortness of breath. Exacerbated by: light and moving. Nothing relieves the symptoms. She has tried rest and acetaminophen for the symptoms. The treatment provided no relief.    Past Medical History:  Diagnosis Date  . Arthritis   . Calculus of gallbladder without mention of cholecystitis or obstruction   . Fibromyalgia   . Goiter   . Hypercholesteremia   . Hypertension   . Lower back pain   . Migraine   . Nontoxic uninodular goiter    sees dr vollmer at [facility]  . Obesity   . Sleep apnea    STOPBANG=5  . Stroke [facility]) 2000    Patient Active Problem List   Diagnosis Date Noted  . Chronic low back pain 08/01/2015  . Right hip pain 08/01/2015  . Abnormality of gait 08/01/2015  . Left leg weakness   . Low back pain with radiation   . Syncope 07/30/2014  . Atypical chest pain 03/12/2014  . Lap chole [facility] April 2013 01/29/2012  . Gallstones 12/04/2011  . Thyroid nodule-non neoplastic goiter by needle aspiration 12/04/2011  . GLUCOSE INTOLERANCE 03/12/2010  . DYSLIPIDEMIA 03/12/2010  . Chronic migraine 03/12/2010  . Carotid stenosis 03/12/2010  . CEREBROVASCULAR ACCIDENT 03/12/2010  . LIPOMA 01/29/2010  . HEADACHE 01/29/2010  . ANKLE INJURY, RIGHT 04/12/2009  . PHARYNGITIS 03/21/2009  . Backache 03/01/2009  . ALLERGIC RHINITIS 03/17/2007  . LOW BACK PAIN 03/17/2007  .  Essential hypertension 01/01/2007  . ANXIETY STATE NOS 09/11/2005    Past Surgical History:  Procedure Laterality Date  . ABDOMINAL HYSTERECTOMY    . CESAREAN SECTION  yrs ago   done x 2  . CHOLECYSTECTOMY  01/05/2012   Procedure: LAPAROSCOPIC CHOLECYSTECTOMY WITH INTRAOPERATIVE CHOLANGIOGRAM;  Surgeon: Pedro Earls, MD;  Location: [facility] ORS;  Service: General;  Laterality: N/A;  . COLONOSCOPY  10/08/2012   Procedure: COLONOSCOPY;  Surgeon: Beryle Beams, MD;  Location: [facility] ENDOSCOPY;  Service: Endoscopy;  Laterality: N/A;  . KNEE ARTHROSCOPY  one 1995 and 1 in 1997   both knees done  . surgery for endometriosis  yrs ago  . thryoid biopsy  December 01, 2011    at [facility]    OB History    No data available       Home Medications    Prior to Admission medications   Medication Sig Start Date End Date Taking? Authorizing Provider  amLODipine (NORVASC) 10 MG tablet Take 10 mg by mouth daily. 11/29/14   Historical Provider, MD  Cholecalciferol 50000 units capsule Take 50,000 Units by mouth every Friday.    Historical Provider, MD  DULoxetine (CYMBALTA) 30 MG capsule Take 3 capsules (90 mg total) by mouth daily. Patient taking differently: Take 30-60 mg by mouth 2 (two) times daily. Take 60mg  in the morning and 30mg  in the evening 08/01/15   Marcial Pacas, MD  loratadine (CLARITIN) 10 MG tablet Take 10 mg by mouth daily.     Historical Provider, MD  methylPREDNISolone (MEDROL DOSEPAK) 4 MG TBPK tablet Take as directed on packet 01/12/16   [name], MD  metoprolol succinate (TOPROL-XL) 100 MG 24 hr tablet Take 1 tablet (100 mg total) by mouth daily. Take with or immediately following a meal. Patient taking differently: Take 50-100 mg by mouth See admin instructions. Pt takes 100mg  in am and 50mg  in pm 03/13/14   Charolette Forward, MD  nitroGLYCERIN (NITROSTAT) 0.4 MG SL tablet Place 1 tablet (0.4 mg total) under the tongue every 5 (five) minutes x 3 doses as needed for chest pain. Patient taking  differently: Place 0.4 mg under the tongue every 5 (five) minutes as needed for chest pain. Maximum 3 doses 03/13/14   Charolette Forward, MD  pantoprazole (PROTONIX) 40 MG tablet Take 40 mg by mouth 2 (two) times daily.     Historical Provider, MD  pregabalin (LYRICA) 75 MG capsule Take 75-150 mg by mouth 2 (two) times daily. Take 2 tablets (150 mg) every morning and 1 tablet (75 mg) at bedtime    Historical Provider, MD  sucralfate (CARAFATE) 1 g tablet Take 1 tablet (1 g total) by mouth 4 (four) times daily. 10/25/15   Tanna Furry, MD  topiramate (TOPAMAX) 100 MG tablet Take 100 mg by mouth 2 (two) times daily. 01/28/15   Historical Provider, MD  valsartan-hydrochlorothiazide (DIOVAN-HCT) 160-25 MG tablet Take 1 tablet by mouth daily. 09/19/15   Historical Provider, MD    Family History Family History  Problem Relation Age of Onset  . Diabetes Mother   . Hypertension Mother   . Transient ischemic attack Mother   . Seizures Mother   . Cancer Brother     colon and lung  . Cancer Maternal Grandmother     colon    Social History Social History  Substance Use Topics  . Smoking status: Never Smoker  . Smokeless tobacco: Never Used  . Alcohol use 0.0 oz/week     Comment: Occasionally     Allergies   Shrimp [shellfish allergy] and Naproxen   Review of Systems Review of Systems  Constitutional: Negative for fever.  HENT: Negative for congestion and rhinorrhea.   Eyes: Negative for discharge and redness.  Respiratory: Negative for cough and shortness of breath.   Cardiovascular: Negative for chest pain.  Gastrointestinal: Positive for diarrhea, nausea and vomiting. Negative for abdominal pain.  Genitourinary: Negative.   Musculoskeletal: Negative.   Skin: Negative.   Neurological: Positive for numbness (L side of her face) and headaches. Negative for syncope and weakness.  Psychiatric/Behavioral: Negative.      Physical Exam Updated Vital Signs BP (!) 172/107 (BP Location: Left  Arm)   Pulse 89   Temp 100.4 F (38 C) (Oral)   Ht 4\' 11"  (1.499 m)   Wt 120.7 kg   SpO2 98%   BMI 53.73 kg/m   Physical Exam  Constitutional: She is oriented to person, place, and time. She appears distressed (mod).  Overweight  HENT:  Head: Normocephalic and atraumatic.  No TTP along face  Eyes: Conjunctivae and EOM are normal. Pupils are equal, round, and reactive to light.  Neck: Normal range of motion. Neck supple. No tracheal deviation present.  Cardiovascular: Normal rate, regular rhythm, normal heart sounds and intact distal pulses.   Pulmonary/Chest: Effort normal. No stridor. No respiratory distress. She has no wheezes. She has rales.  Abdominal: Soft. She  exhibits no distension. There is no tenderness. There is no rebound and no guarding.  Musculoskeletal: She exhibits no edema.  Neurological: She is alert and oriented to person, place, and time. She has normal strength. A cranial nerve deficit (L CN V entirely) and sensory deficit (as above, no symptoms along extremities or trunk) is present. Coordination and gait normal. GCS eye subscore is 4. GCS verbal subscore is 5. GCS motor subscore is 6.  Skin: Skin is warm and dry. Capillary refill takes less than 2 seconds. She is not diaphoretic.  Psychiatric: She has a normal mood and affect. Her behavior is normal. Thought content normal.  Nursing note and vitals reviewed.    ED Treatments / Results  Labs (all labs ordered are listed, but only abnormal results are displayed) Labs Reviewed  BASIC METABOLIC PANEL - Abnormal; Notable for the following:       Result Value   Potassium 3.4 (*)    Glucose, Bld 116 (*)    All other components within normal limits    EKG  EKG Interpretation None       Radiology No results found.  Procedures Procedures (including critical care time)  Medications Ordered in ED Medications  prochlorperazine (COMPAZINE) injection 10 mg (10 mg Intravenous Given 05/06/16 0908)    diphenhydrAMINE (BENADRYL) injection 25 mg (25 mg Intravenous Given 05/06/16 0909)  dexamethasone (DECADRON) injection 10 mg (10 mg Intravenous Given 05/06/16 0910)  amLODipine (NORVASC) tablet 10 mg (10 mg Oral Given 05/06/16 0938)  metoprolol tartrate (LOPRESSOR) tablet 100 mg (100 mg Oral Given 05/06/16 0911)  topiramate (TOPAMAX) tablet 100 mg (100 mg Oral Given 05/06/16 0938)  sodium chloride 0.9 % bolus 1,000 mL (0 mLs Intravenous Stopped 05/06/16 1212)     Initial Impression / Assessment and Plan / ED Course  I have reviewed the triage vital signs and the nursing notes.  Pertinent labs & imaging results that were available during my care of the patient were reviewed by me and considered in my medical decision making (see chart for details).  Clinical Course   55 year old female with history of stroke and complex migraines presents to the emergency department with headache consistent with prior migraines however more severe per patient. Associated photophobia and nausea and vomiting 3. Patient had a neurological exam consistent with prior episodes of complex migraines including a left-sided face weakness. Otherwise exam was grossly unremarkable for focal neuro deficits. Patient was given Decadron, Compazine, Benadryl addition to fluids. On reassessment patient had unremarkable neurological exam. Patient stated she felt better. Gait was intact. PO challenge was successful. Patient significantly improved. Usual and customary return precautions were discussed for headaches. Due to improvement of symptoms did not think the patient is suffering from its stroke, hemorrhagic or ischemic. Patient did also have elevated blood pressure however improved after initiation of morning blood pressure medication and pain control. Patient had denied any short of breath chest pain. No visual changes noted. Cr WNL. All questions answered. Pt and VS WNL at discharge.   Final Clinical Impressions(s) / ED Diagnoses    Final diagnoses:  Migraine with aura and without status migrainosus, not intractable    New Prescriptions New Prescriptions   No medications on file     Darlina Rumpf, MD 05/07/16 Regan, MD 05/09/16 1615

## 2016-05-06 NOTE — ED Notes (Signed)
Pt states she takes Topamax 3x/day for migraines, has been taking. Also states she took 2 tylenol a hours ago PTA.

## 2016-05-09 IMAGING — MR MR HEAD W/O CM
9 of 10 series · 36 of 48 positions shown · non-contrast
Comparison: Head CT without contrast 2222 hours today. Brain MRI
07/31/2014, and earlier.

CLINICAL DATA: 53-year-old female awoke at 8228 hours today with
left facial numbness. Headache. Left facial droop. Initial
encounter.

EXAM:
MRI HEAD WITHOUT CONTRAST
TECHNIQUE: Multiplanar, multiecho pulse sequences of the brain and surrounding
structures were obtained without intravenous contrast.

[Series 3: DWI · axial · 3.0mm · 1.09mm/px · z∈[-109,+22]mm · 9 of 90 slices shown (1 of 4)]
[im 1/90]
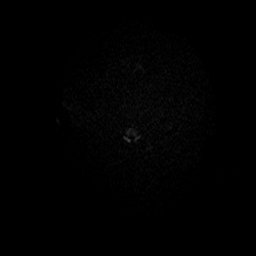
[im 12/90]
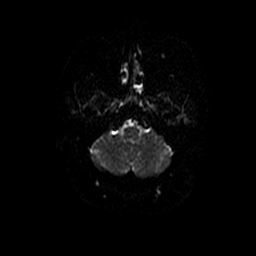
[im 23/90]
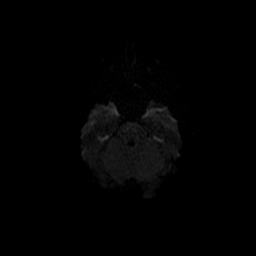
[im 34/90]
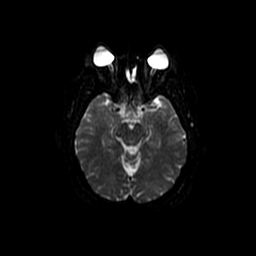
[im 45/90]
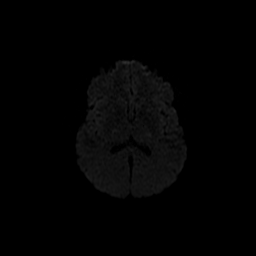
[im 56/90]
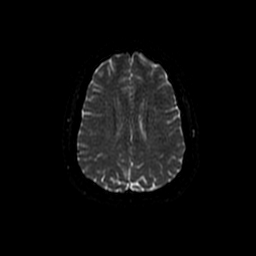
[im 67/90]
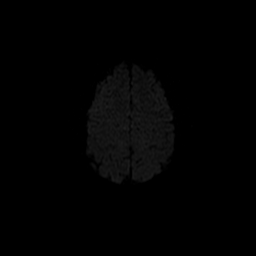
[im 78/90]
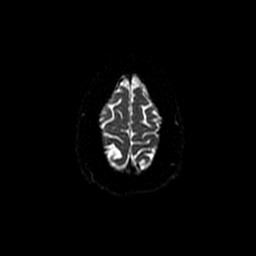
[im 90/90]
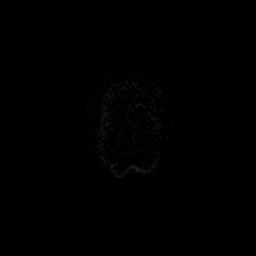

[Series 4: T2 · axial · 5.0mm · 0.43mm/px · z∈[-109,+22]mm · 2 of 23 slices shown (1 of 2)]
[im 1/23]
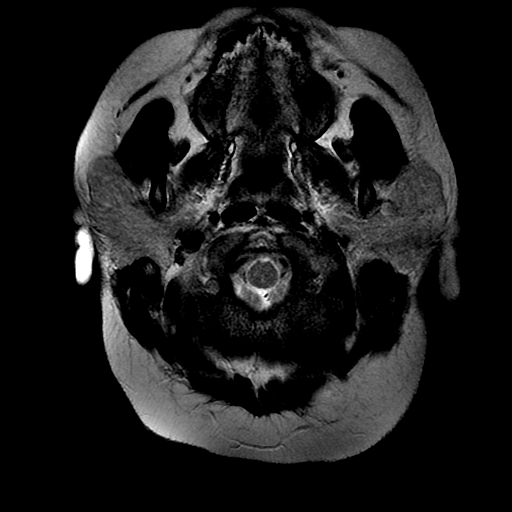
[im 23/23]
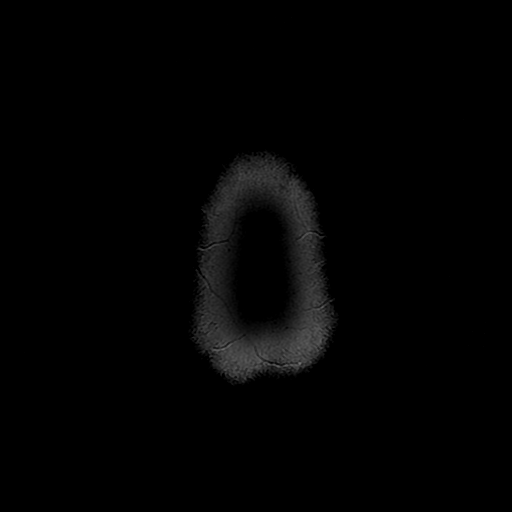

[Series 5: FLAIR · axial · 5.0mm · 0.43mm/px · z∈[-109,+22]mm · 3 of 23 slices shown]
[im 1/23]
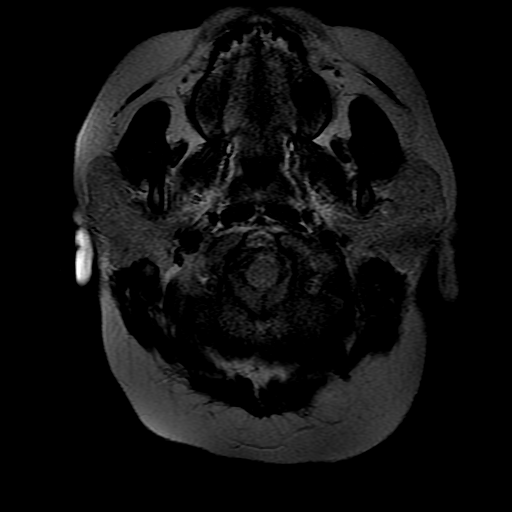
[im 12/23]
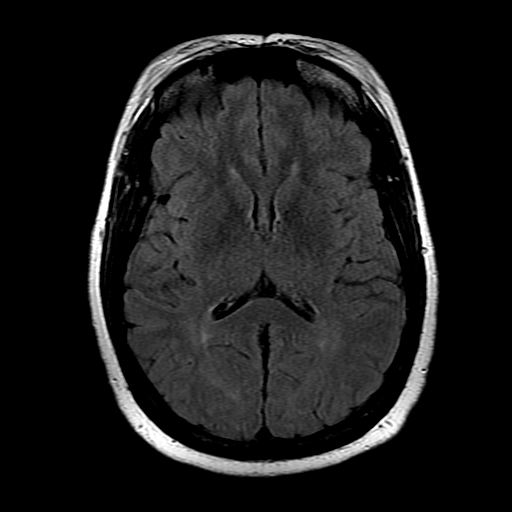
[im 23/23]
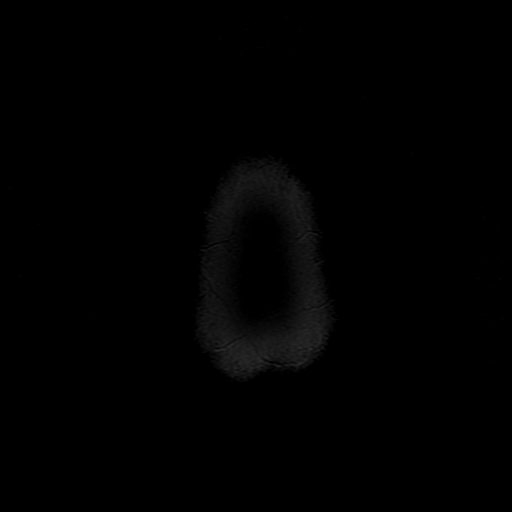

[Series 6: ax mpgr · axial · 5.0mm · 0.43mm/px · 1 of 23 slices shown]
[im 1/23]
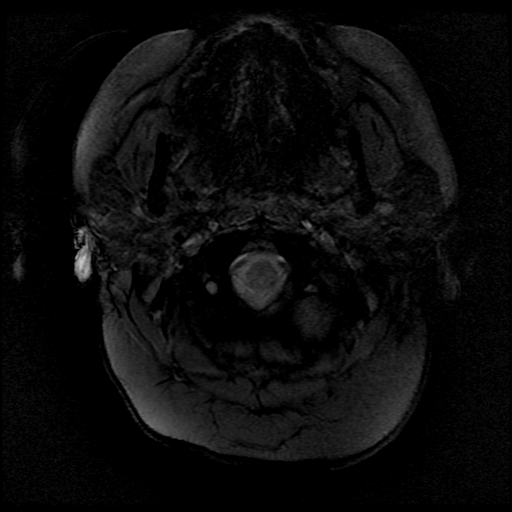

[Series 8: T1 · sagittal · 5.0mm · 0.47mm/px · 3 of 23 slices shown]
[im 1/23]
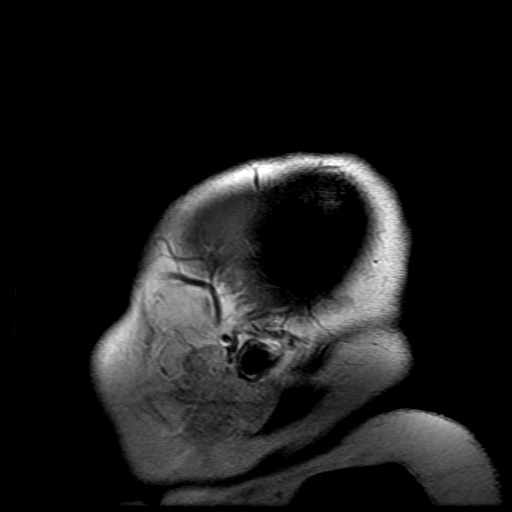
[im 12/23]
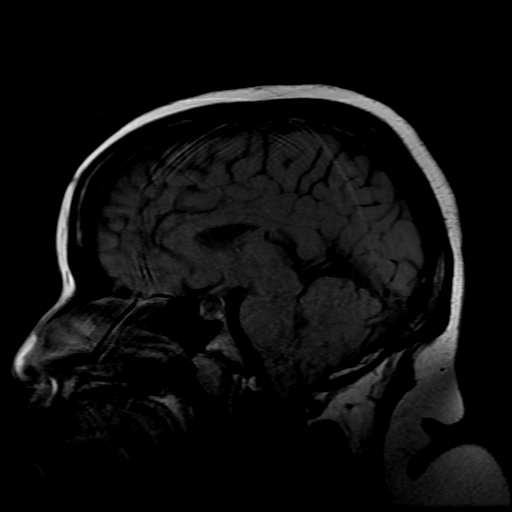
[im 23/23]
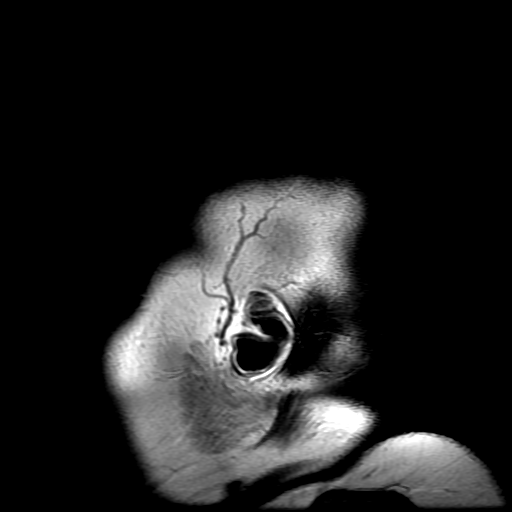

[Series 9: DWI · coronal · 5.0mm · 1.09mm/px · 7 of 62 slices shown (2 of 4)]
[im 1/62]
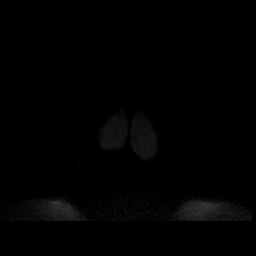
[im 11/62]
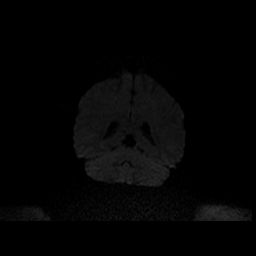
[im 21/62]
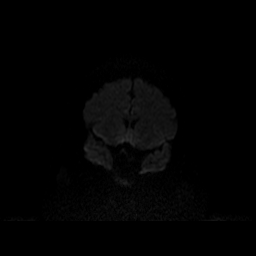
[im 31/62]
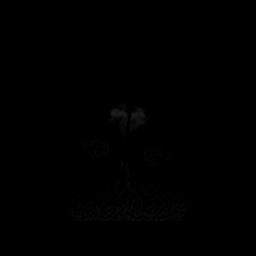
[im 41/62]
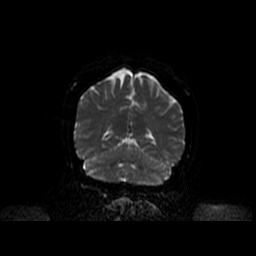
[im 51/62]
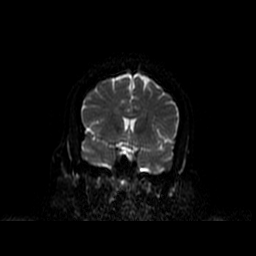
[im 62/62]
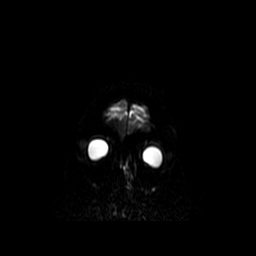

[Series 11: T2 · coronal · 5.0mm · 0.43mm/px · 3 of 26 slices shown (2 of 2)]
[im 1/26]
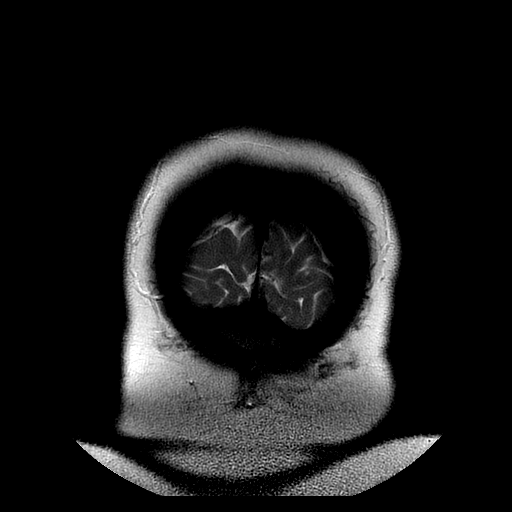
[im 13/26]
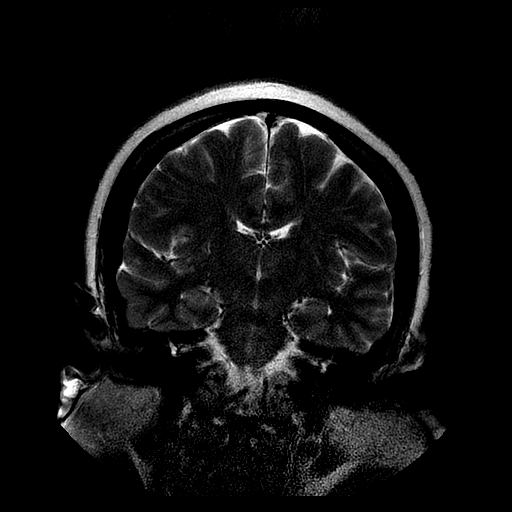
[im 26/26]
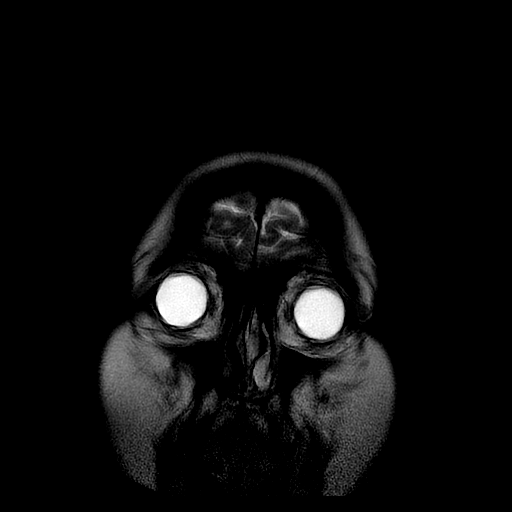

[Series 300: DWI · axial · 3.0mm · 1.09mm/px · z∈[-109,+22]mm · 5 of 45 slices shown (3 of 4)]
[im 1/45]
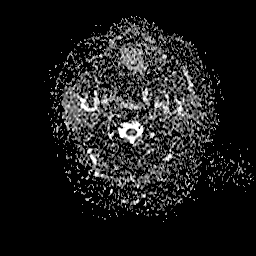
[im 12/45]
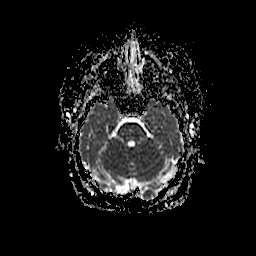
[im 23/45]
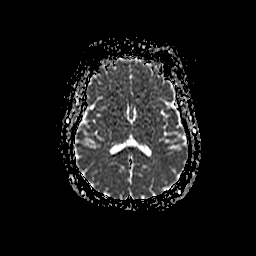
[im 34/45]
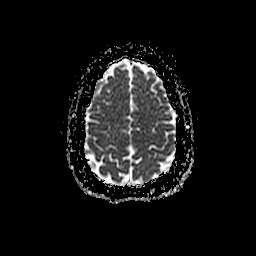
[im 45/45]
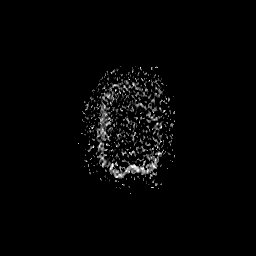

[Series 900: DWI · coronal · 5.0mm · 1.09mm/px · 3 of 31 slices shown (4 of 4)]
[im 1/31]
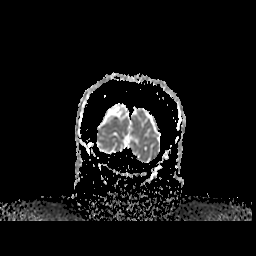
[im 16/31]
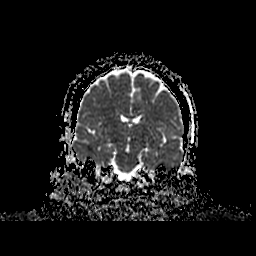
[im 31/31]
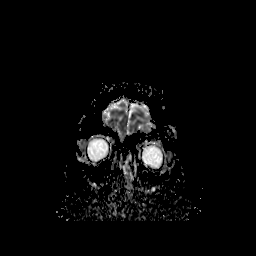

[36 of 48 positions shown; findings below may reference images not displayed]

FINDINGS: No restricted diffusion or evidence of acute infarction. Major
intracranial vascular flow voids are stable.

No midline shift, mass effect, evidence of mass lesion,
ventriculomegaly, extra-axial collection or acute intracranial
hemorrhage. Cervicomedullary junction and pituitary are stable. Gray
and white matter signal is stable since 9120, mild nonspecific T2
and FLAIR hyperintensity primarily in the pons. No cortical
encephalomalacia or chronic cerebral blood products.

Mastoids and paranasal sinuses remain clear. Orbit and scalp soft
tissues are stable.
IMPRESSION: No acute intracranial abnormality.

Stable noncontrast MRI appearance of the brain since 9120, with
nonspecific signal changes primarily in the pons.

## 2016-05-09 IMAGING — CT CT HEAD W/O CM
1 series · 16 of 30 positions shown, 20 images · non-contrast
Comparison: 07/31/2014 and 07/30/2014

CLINICAL DATA: Left sided headache and numbness.  Code stroke.

EXAM:
CT HEAD WITHOUT CONTRAST
TECHNIQUE: Contiguous axial images were obtained from the base of the skull
through the vertex without contrast.

[Series 2: head 5.0 h30s · axial · 0.45mm/px · z∈[-113,+32]mm · 16 of 33 slices shown, 20 images]
[im 2/33  brain]
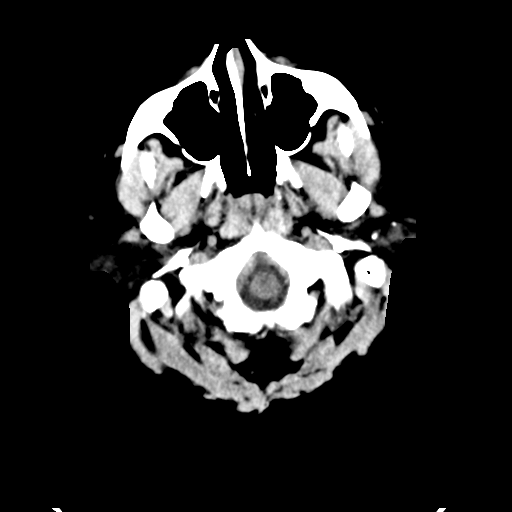
[im 2/33  bone]
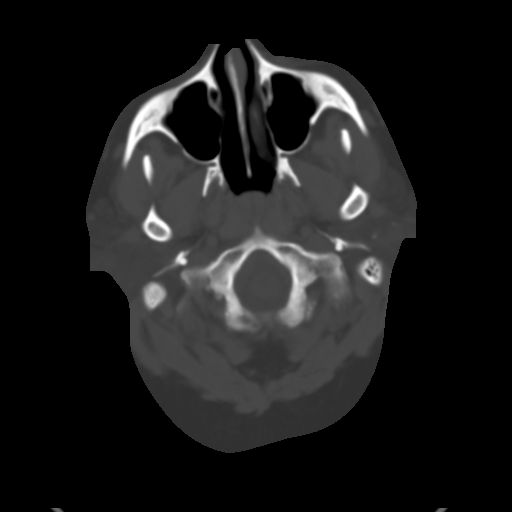
[im 4/33  brain]
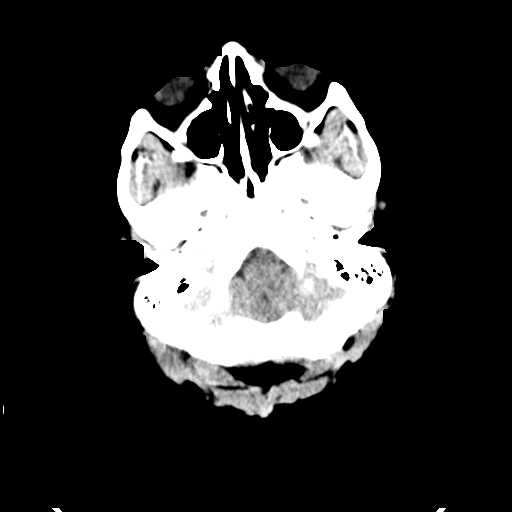
[im 6/33  brain]
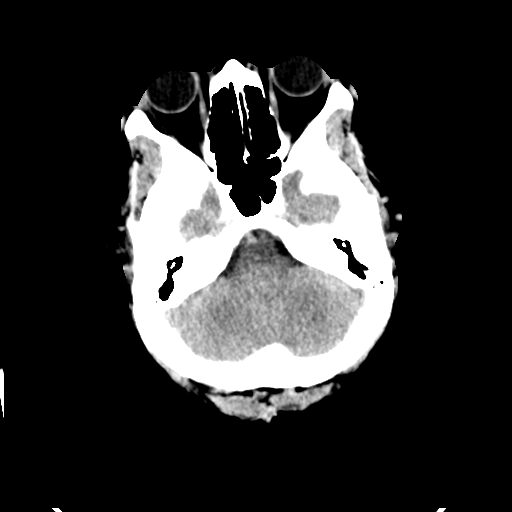
[im 8/33  brain]
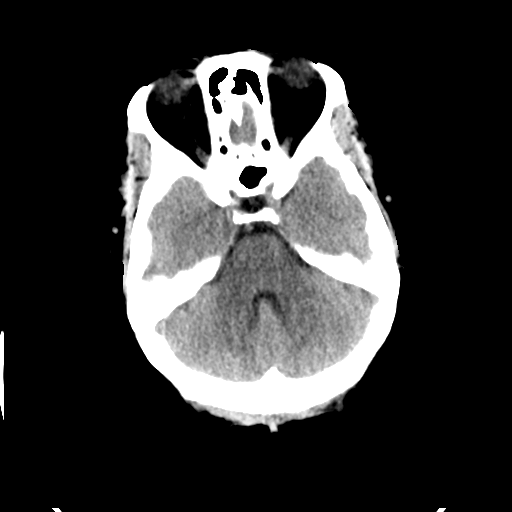
[im 9/33  brain]
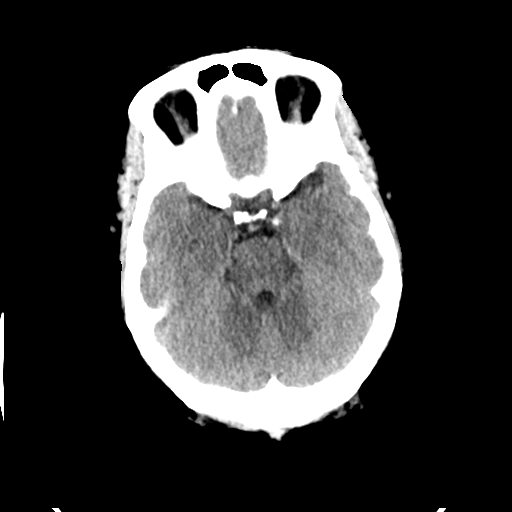
[im 9/33  bone]
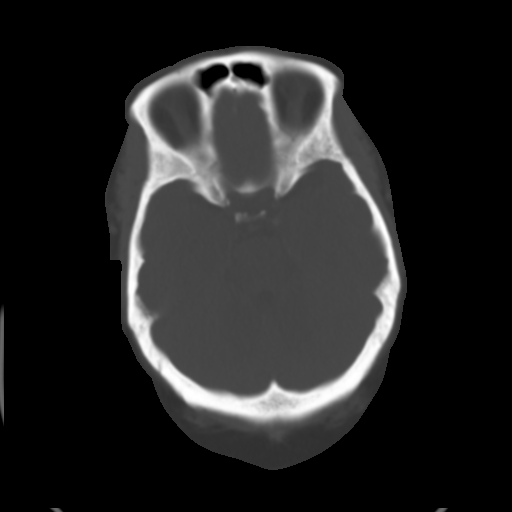
[im 12/33  brain]
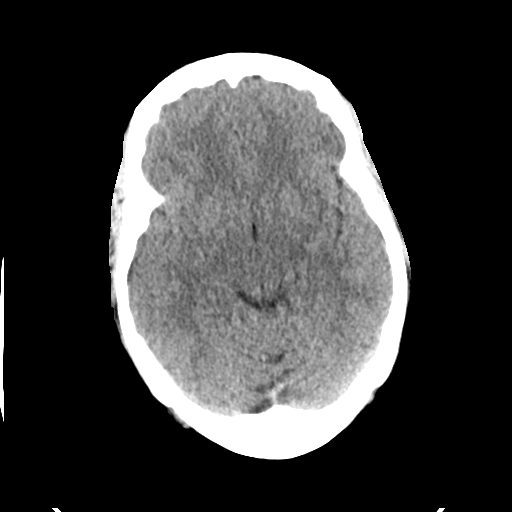
[im 14/33  brain]
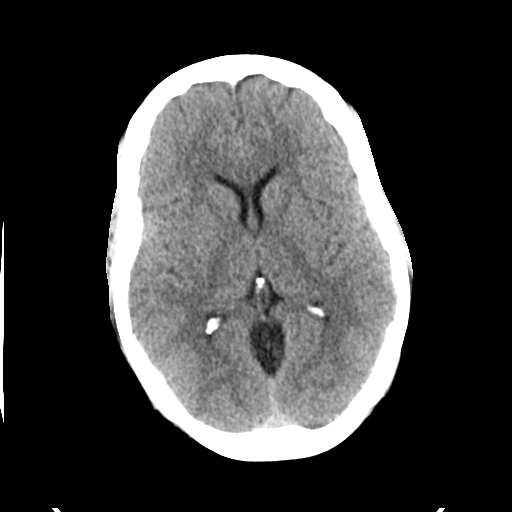
[im 16/33  brain]
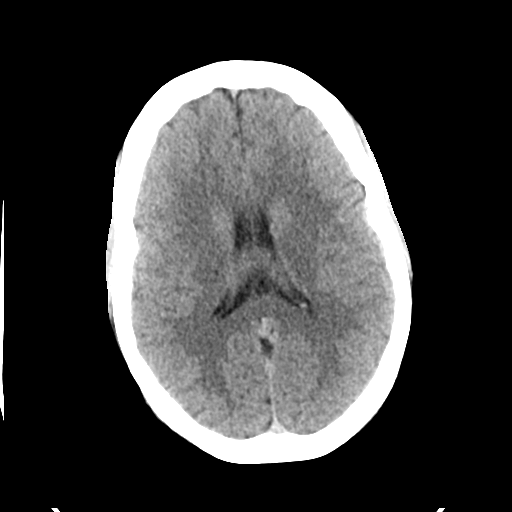
[im 17/33  brain]
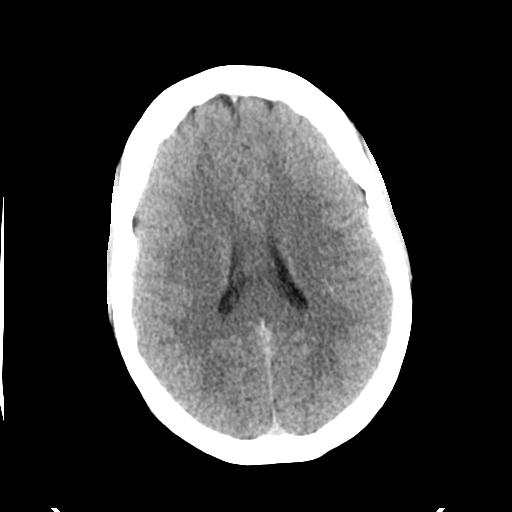
[im 17/33  bone]
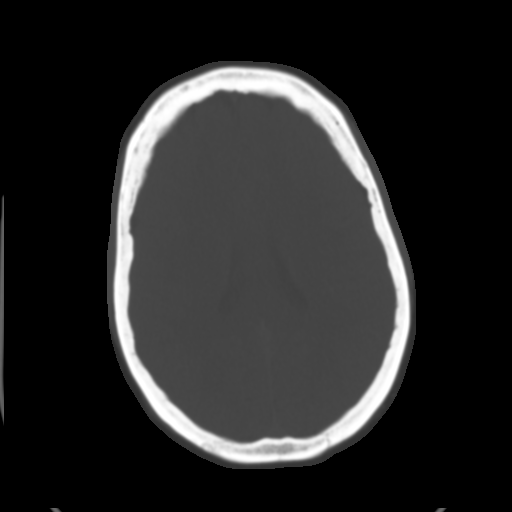
[im 19/33  brain]
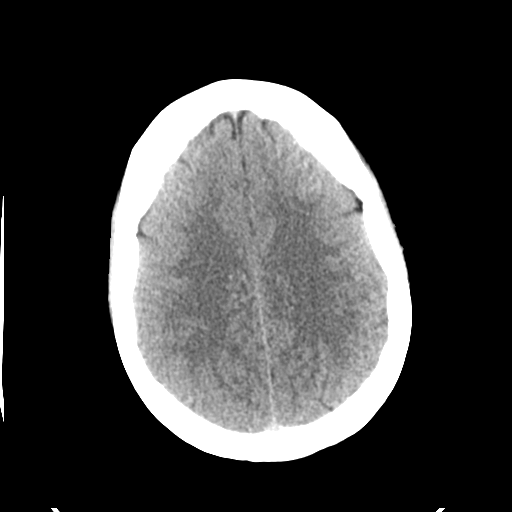
[im 21/33  brain]
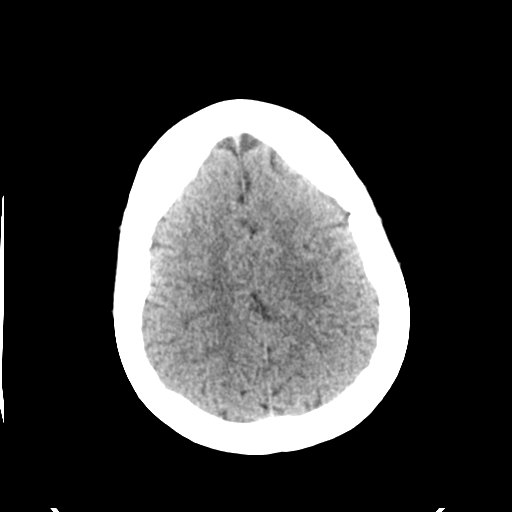
[im 24/33  brain]
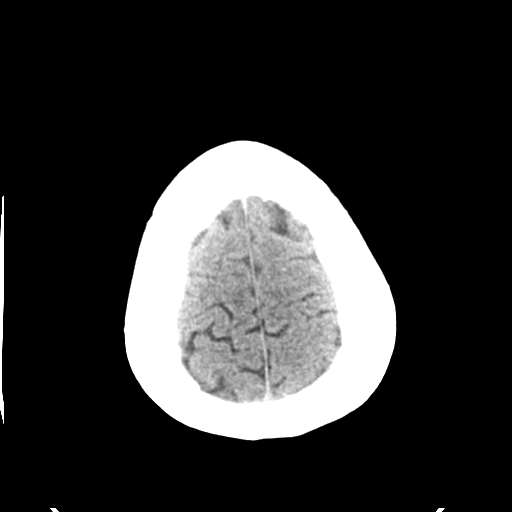
[im 25/33  brain]
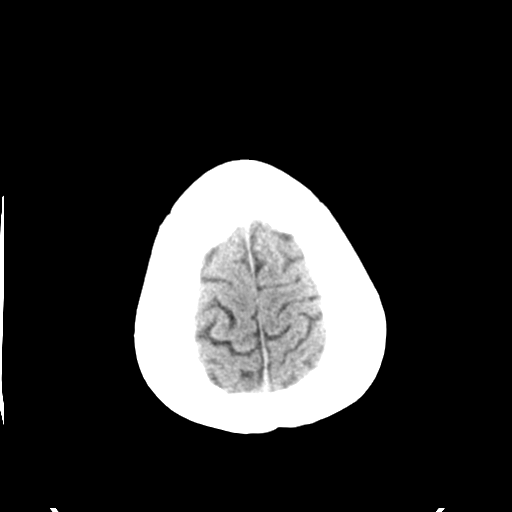
[im 25/33  bone]
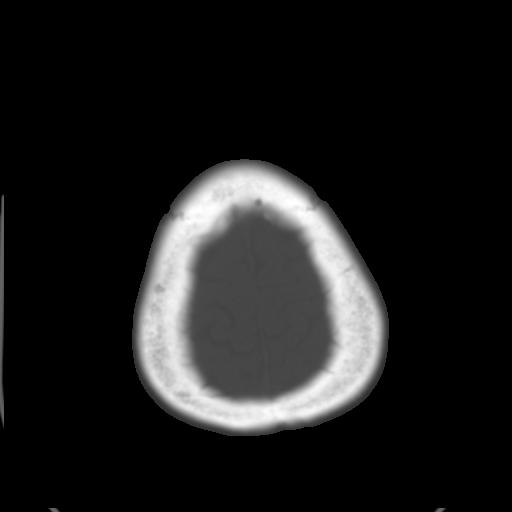
[im 27/33  brain]
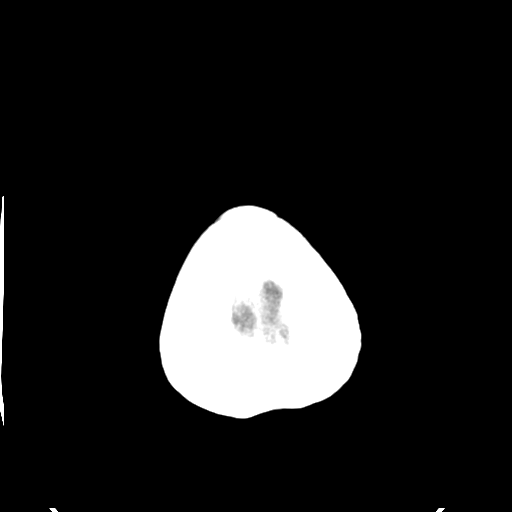
[im 29/33  brain]
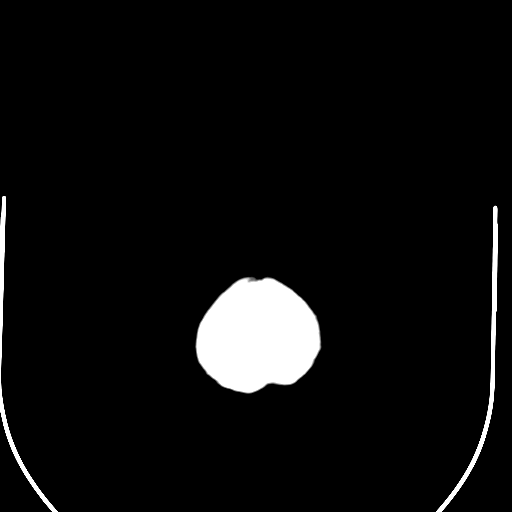
[im 31/33  brain]
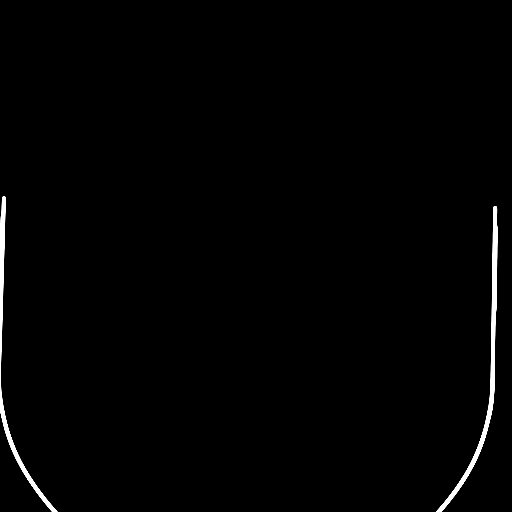

[16 of 30 positions shown; findings below may reference images not displayed]

FINDINGS: Normal appearance of the intracranial structures. No evidence for
acute hemorrhage, mass lesion, midline shift, hydrocephalus or large
infarct. No acute bony abnormality. The visualized sinuses are
clear.
IMPRESSION: Negative head CT.

## 2016-08-07 IMAGING — CR DG LUMBAR SPINE 2-3V
3 series · 3 of 3 positions shown · non-contrast
Comparison: Lumbar spine MRI of July 31, 2014

CLINICAL DATA: Chronic back and hip pain; no recent injury

EXAM:
LUMBAR SPINE - 2-3 VIEW

[t l-spine a.p.]
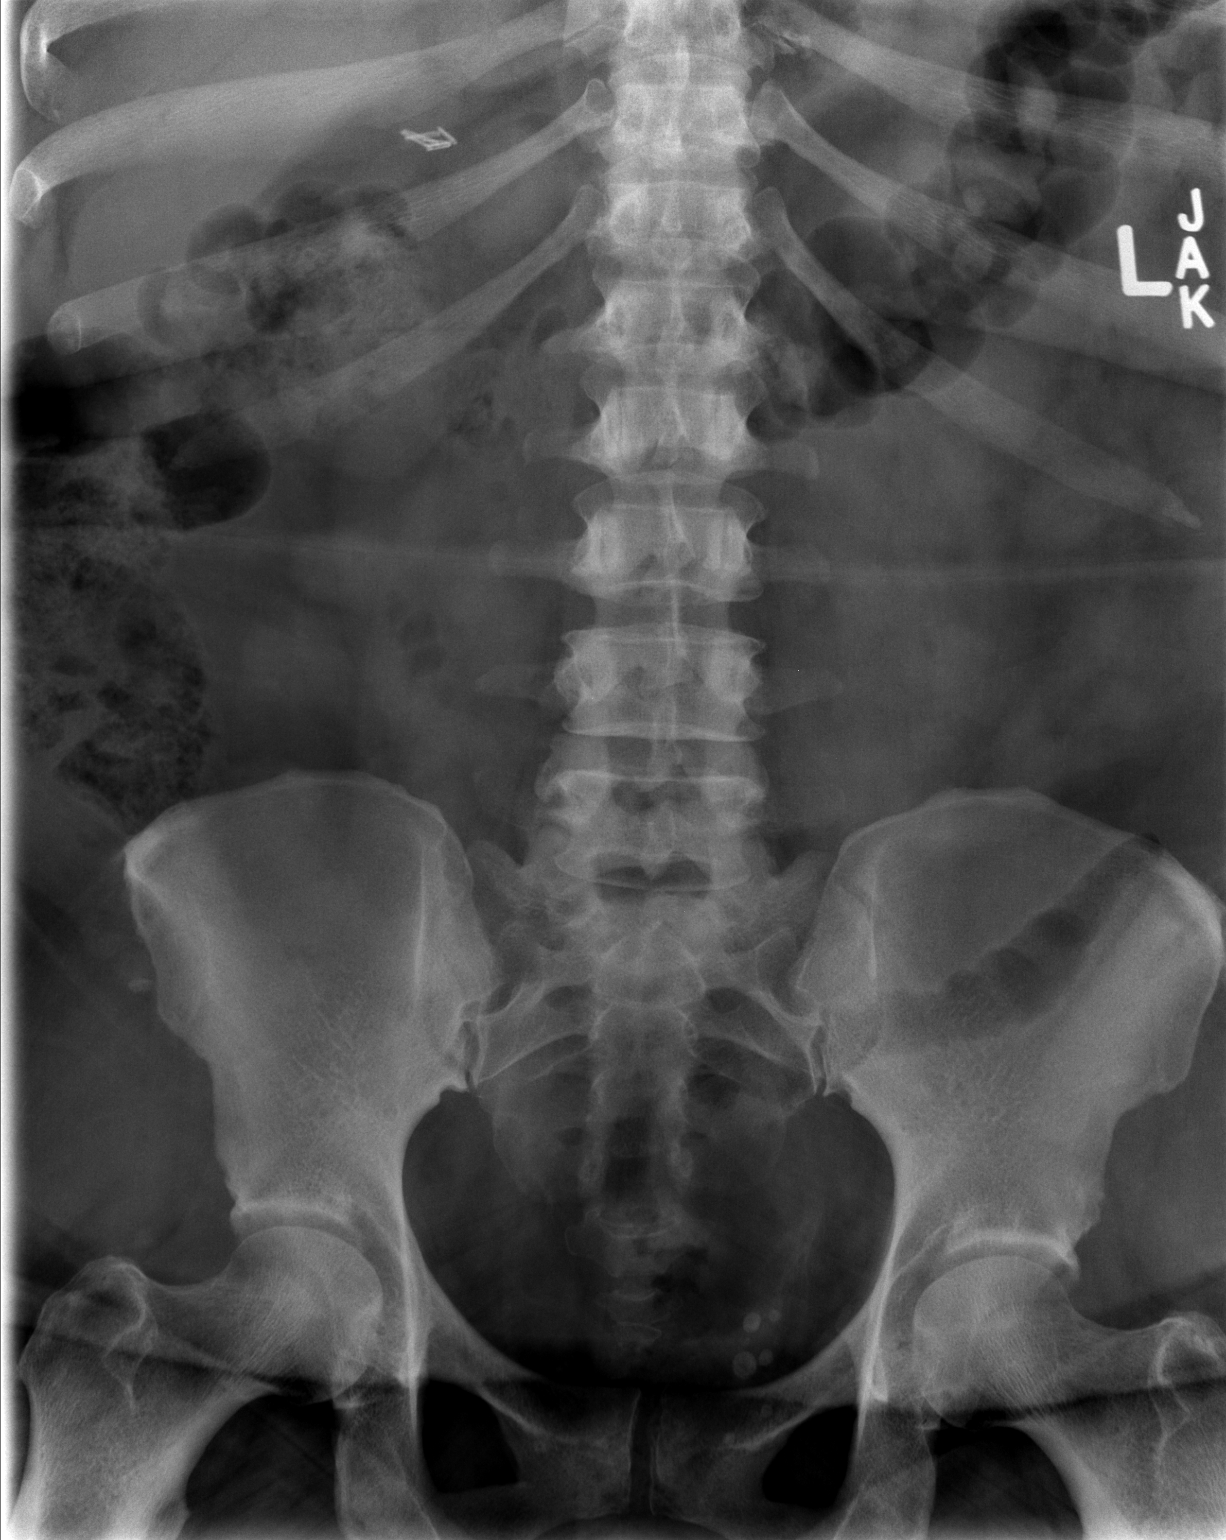

[t l-spine lat]
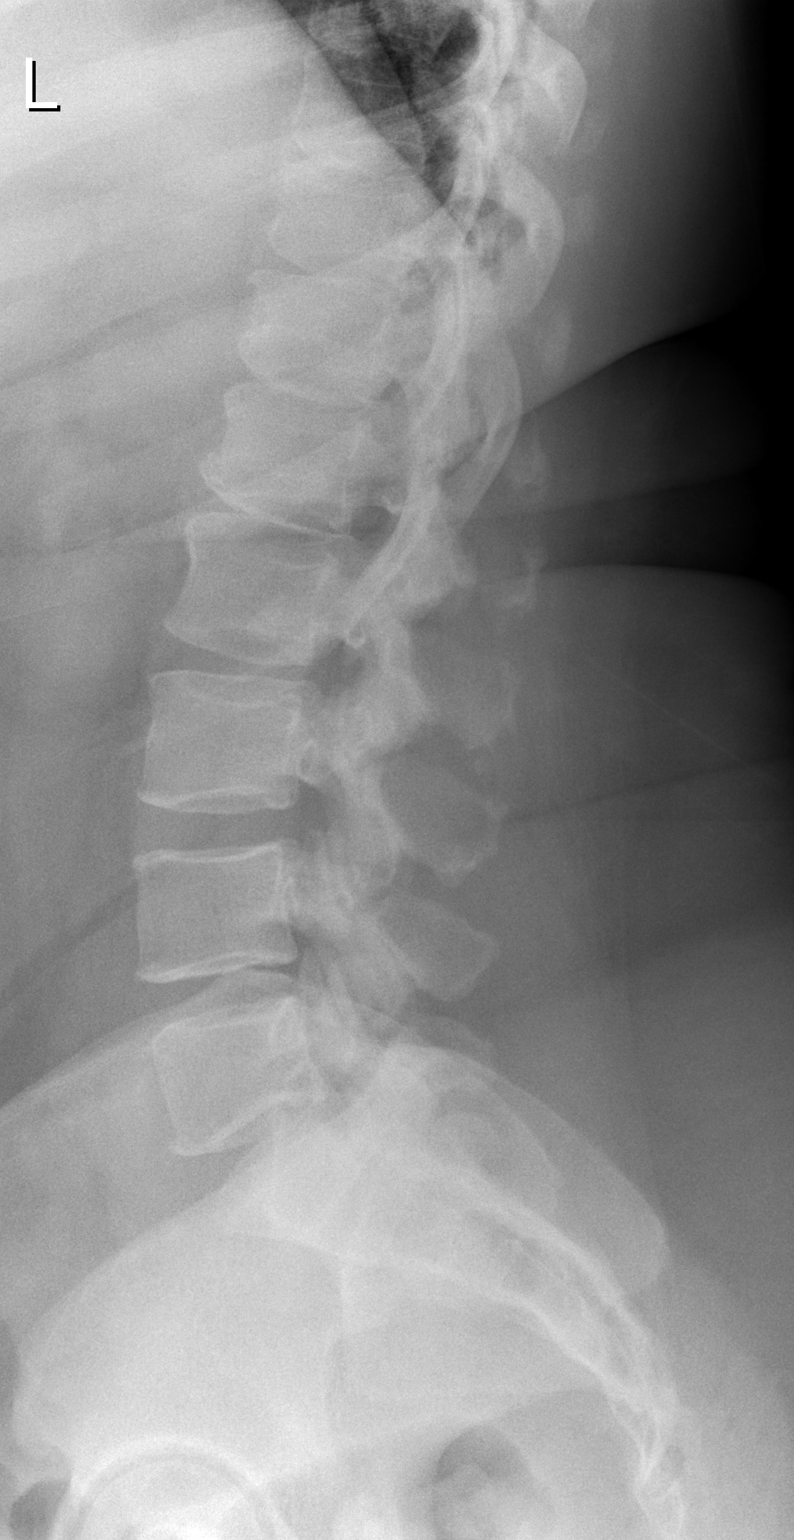

[t l-spine l5-s1 spot]
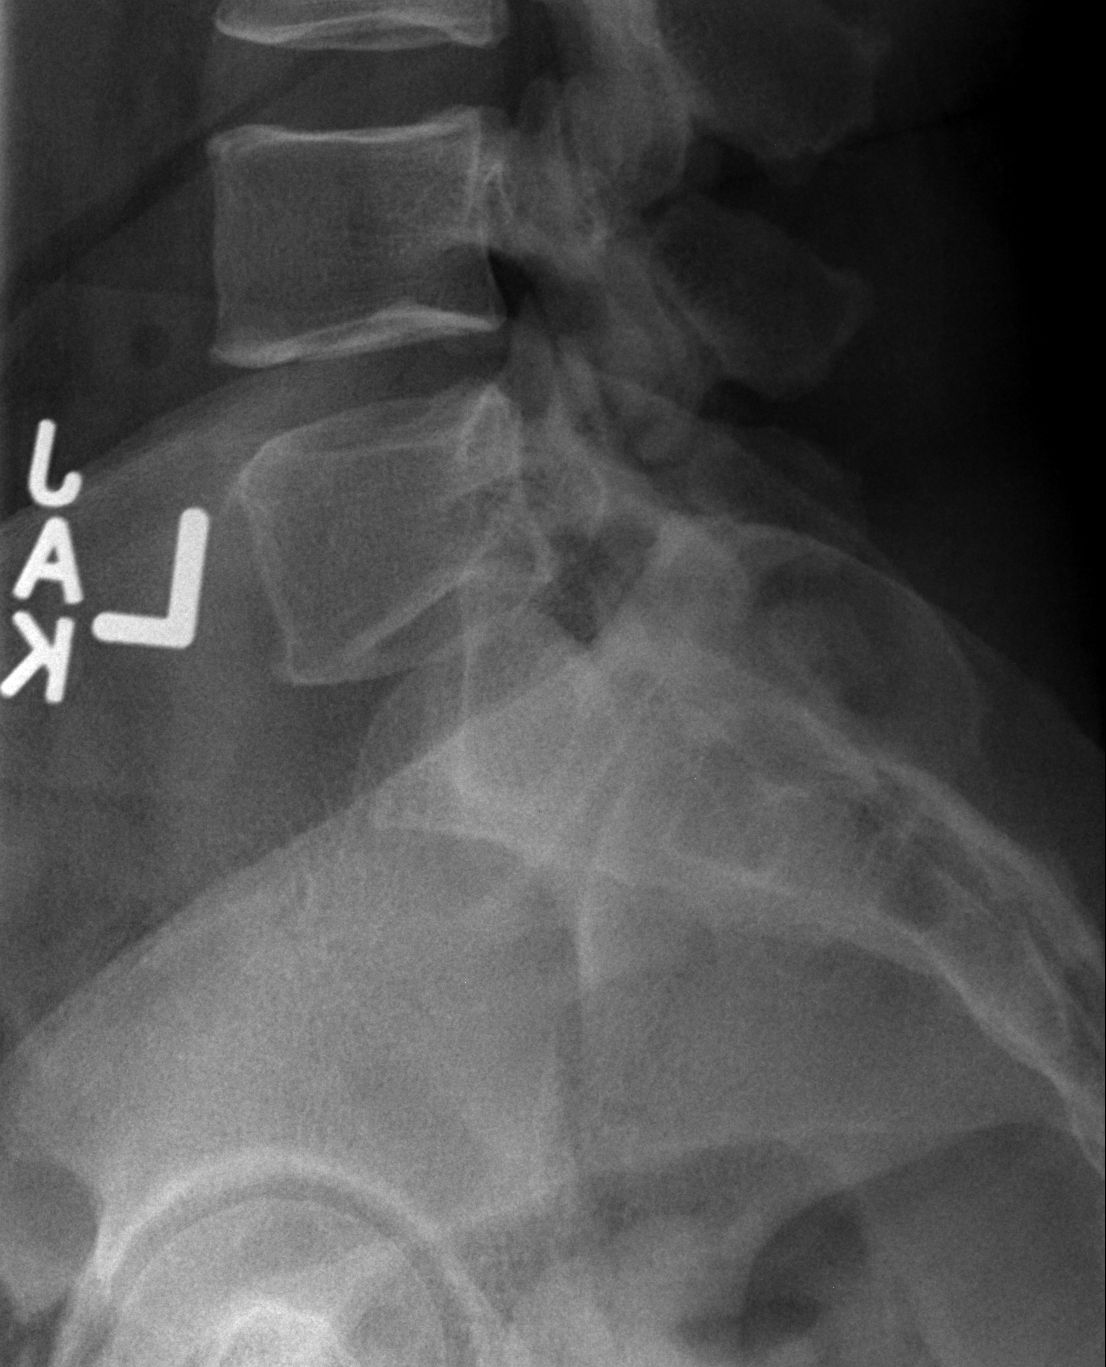

[3 of 3 positions shown; findings below may reference images not displayed]

FINDINGS: The lumbar vertebral bodies are preserved in height. The disc space
heights are well maintained. There is no spondylolisthesis. There is
no significant facet joint hypertrophy. The pedicles and transverse
processes are intact. The observed portions of the sacrum are
unremarkable. There are surgical clips in the gallbladder fossa.
There are phleboliths within the pelvis. The bowel gas pattern is
unremarkable.
IMPRESSION: There is no acute or significant chronic bony abnormality of the
lumbar spine on this radiographic series.

## 2016-08-07 IMAGING — CR DG THORACIC SPINE 3V
3 series · 3 of 3 positions shown · non-contrast
Comparison: Chest radiograph February 11, 2015

CLINICAL DATA: Chronic dorsalgia

EXAM:
THORACIC SPINE - 3 VIEWS

[t t-spine a.p.]
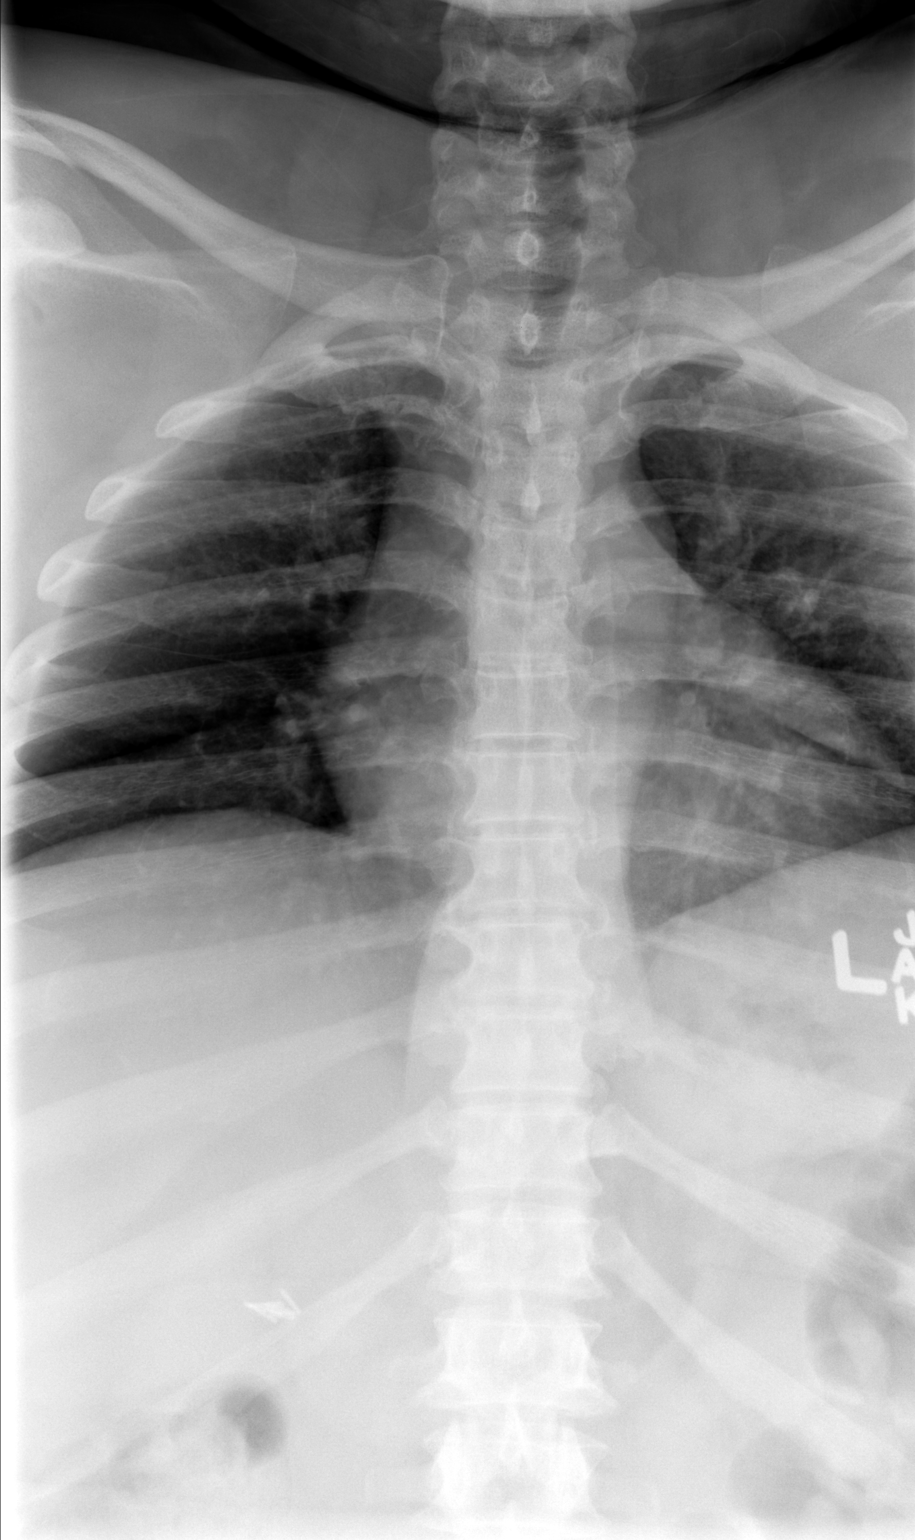

[t t-spine lat *]
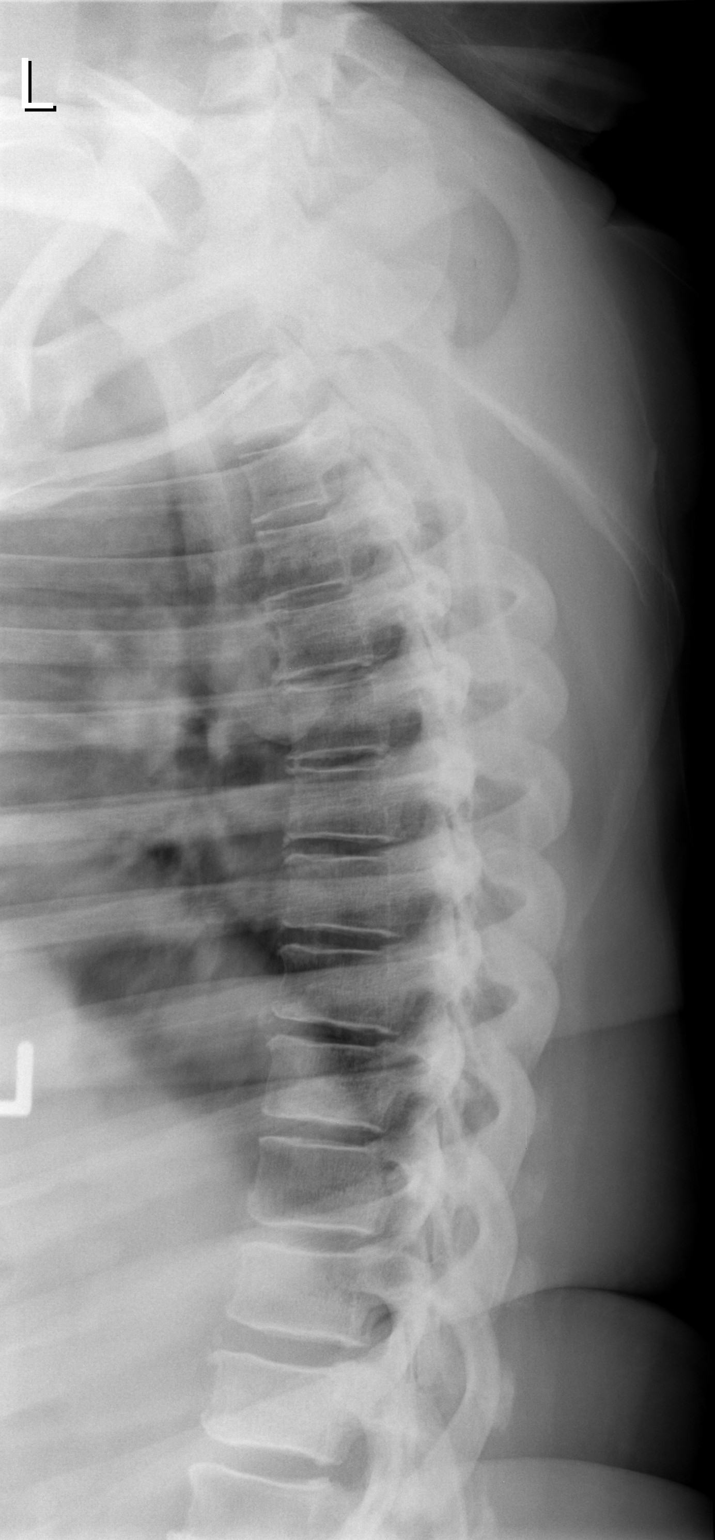

[t swimmers]
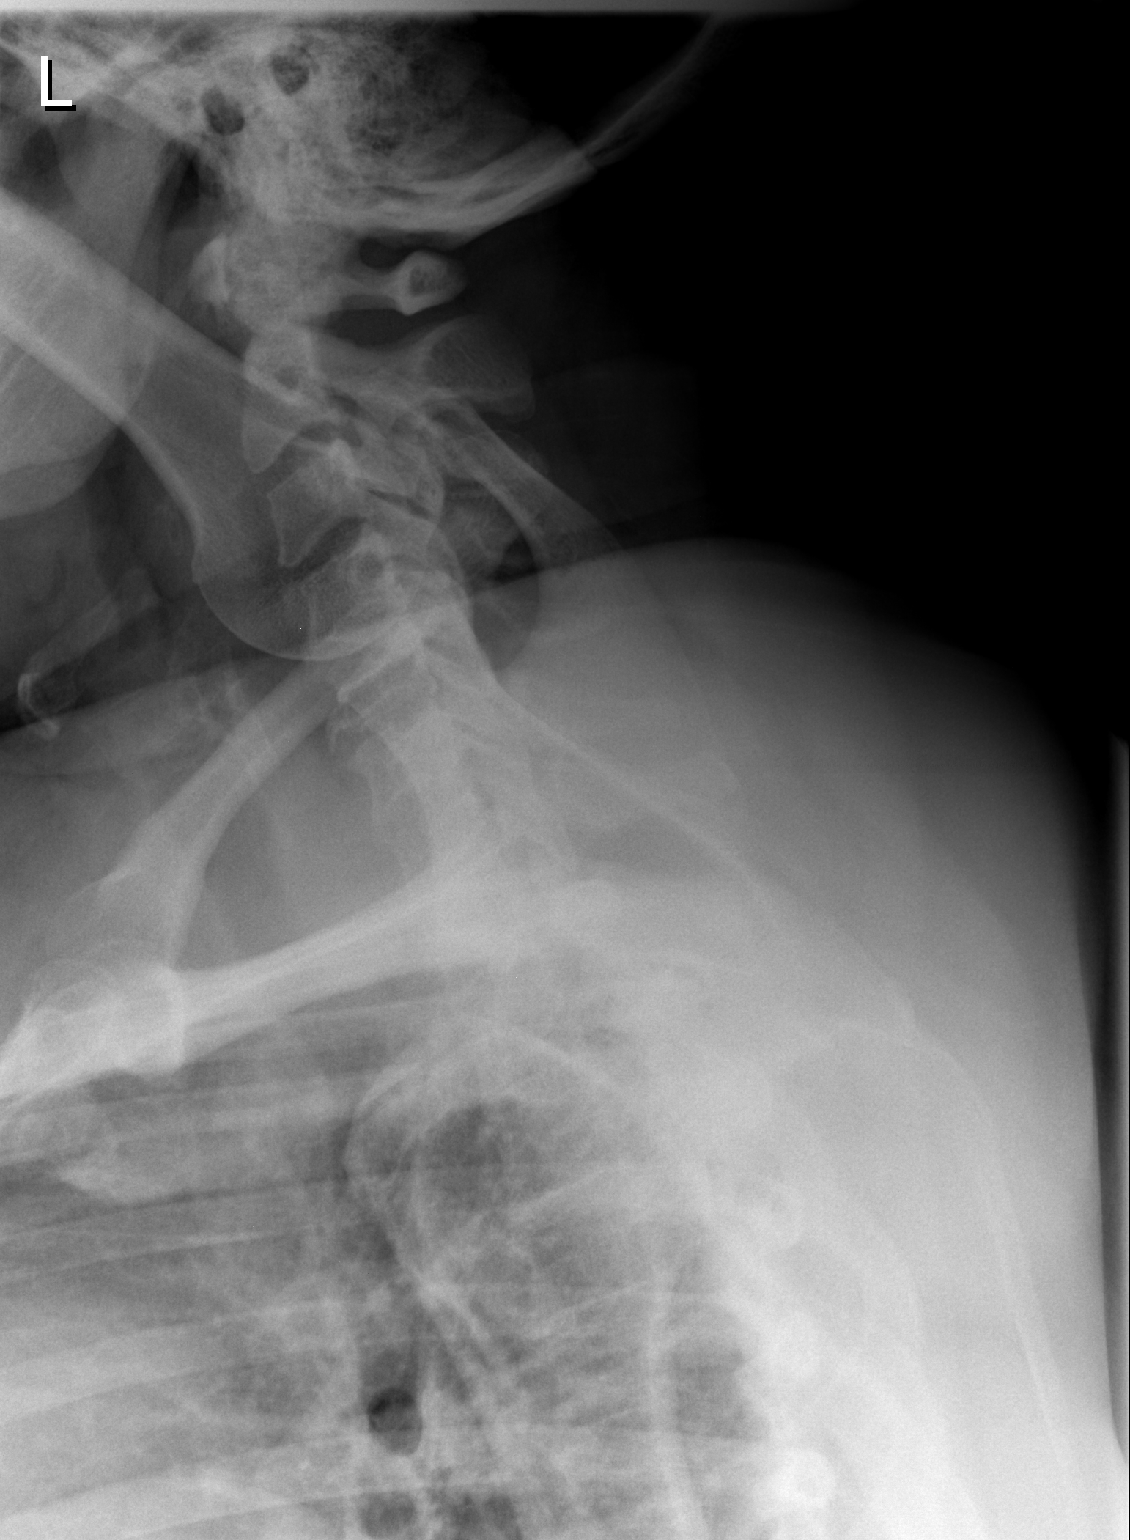

[3 of 3 positions shown; findings below may reference images not displayed]

FINDINGS: Frontal, lateral, and swimmer's views were obtained. There is no
fracture or spondylolisthesis. There is slight disc space narrowing
at several levels. No erosive change or paraspinous lesion.
IMPRESSION: Mild osteoarthritic changes several levels. No fracture or
spondylolisthesis.

## 2016-08-07 IMAGING — CR DG HIP (WITH OR WITHOUT PELVIS) 2-3V*R*
2 series · 2 of 2 positions shown · non-contrast
Comparison: None in PACs

CLINICAL DATA: Chronic bilateral hip pain greater on the right than
on the left also right foot numbness, no recent injury

EXAM:
DG HIP (WITH OR WITHOUT PELVIS) 2-3V RIGHT

[w pelvis *]
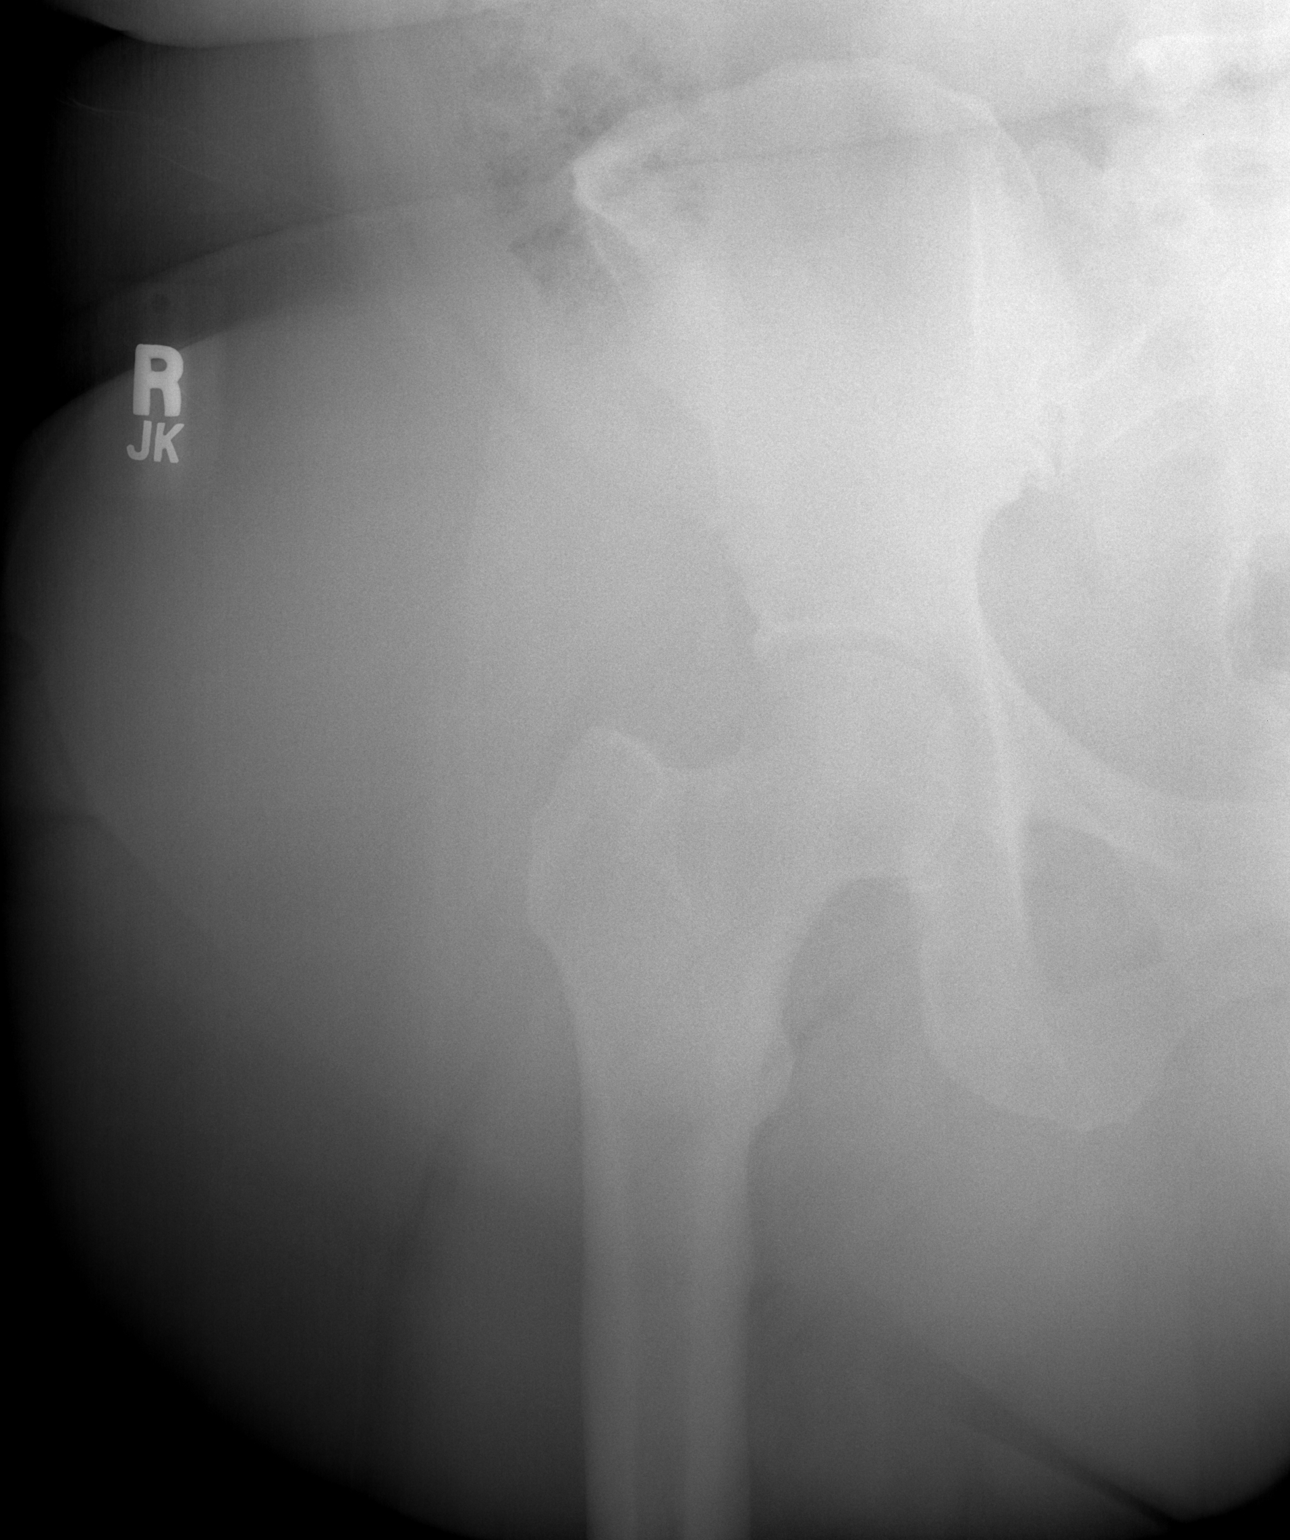

[t hip ap right]
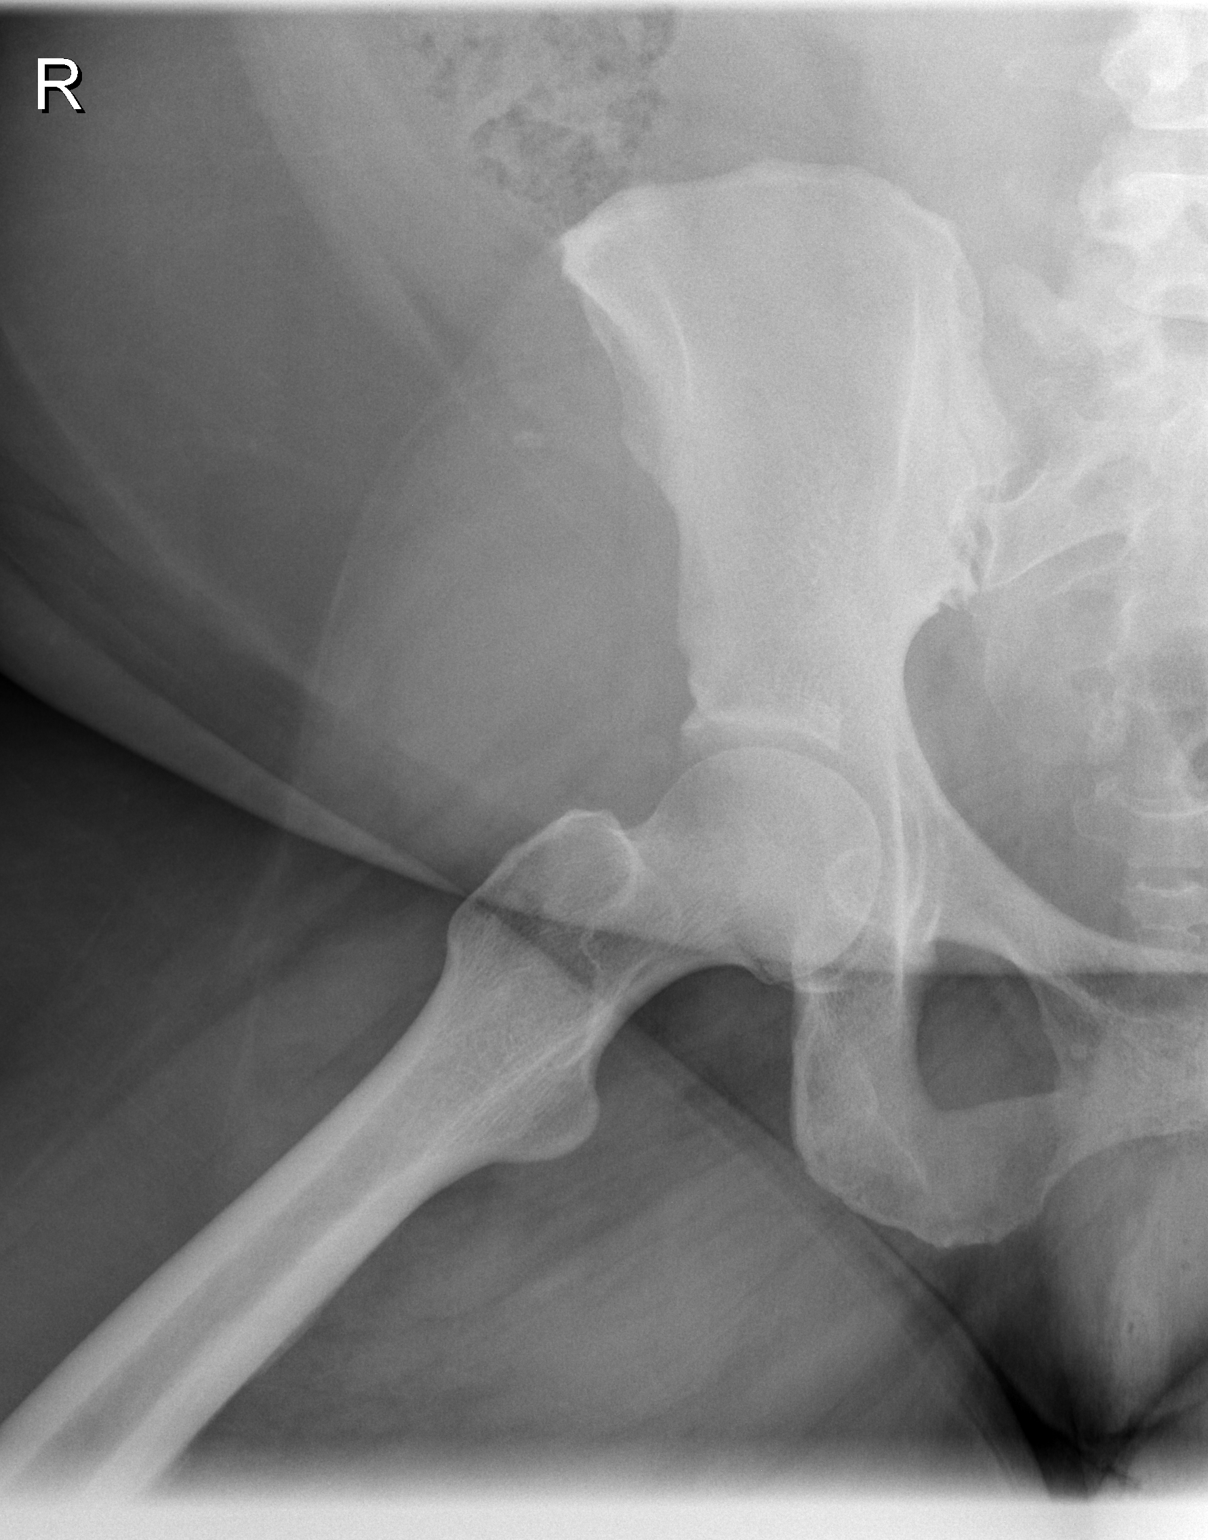

[2 of 2 positions shown; findings below may reference images not displayed]

FINDINGS: AP and frog-leg lateral views of the right hip were limited due to
large amount of scatter effects from overlying pannus. The hip joint
spaces reasonably well maintained. The articular surfaces of the
femoral head and acetabulum remains smoothly rounded. The femoral
neck and intertrochanteric and subtrochanteric regions are normal.
The observed portions of the right hemipelvis are normal.
IMPRESSION: There is no acute or significant chronic bony abnormality of the
right hip.

## 2016-09-16 IMAGING — CT CT RENAL STONE PROTOCOL
2 of 4 series · 16 of 46 positions shown, 18 images · non-contrast
Comparison: None.

CLINICAL DATA: LEFT flank pain for 3 days. History of
cholelithiasis, hypertension.

EXAM:
CT ABDOMEN AND PELVIS WITHOUT CONTRAST
TECHNIQUE: Multidetector CT imaging of the abdomen and pelvis was performed
following the standard protocol without IV contrast.

[Series 2: stone study 5.0 i30f 1 · axial · 0.75mm/px · z∈[-433,-13]mm · 13 of 92 slices shown, 15 images]
[im 4/92  soft-tissue]
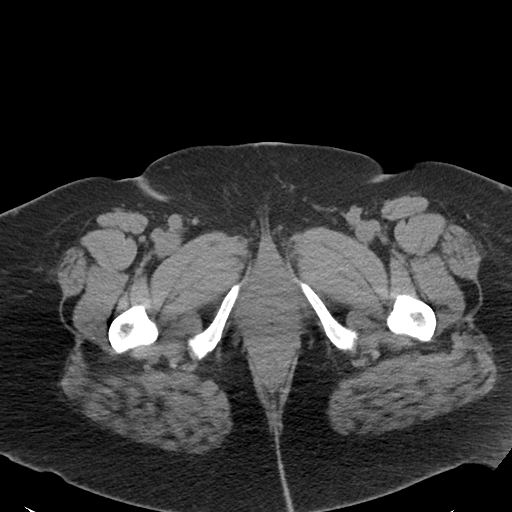
[im 4/92  bone]
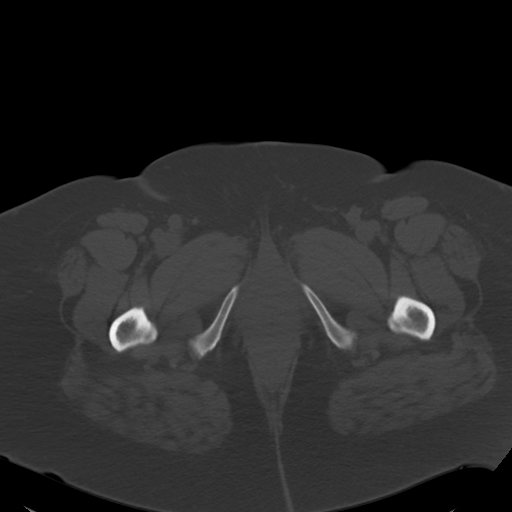
[im 12/92  soft-tissue]
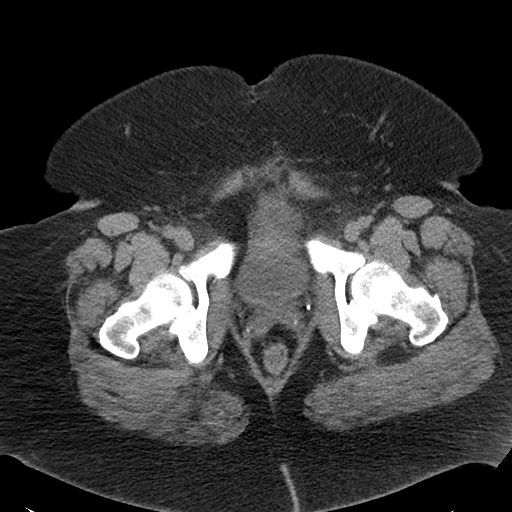
[im 19/92  soft-tissue]
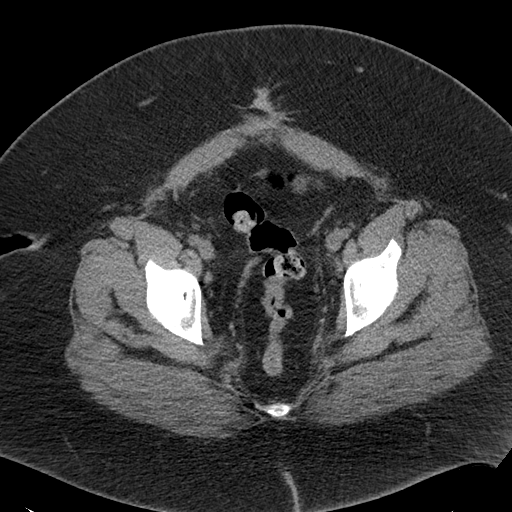
[im 27/92  soft-tissue]
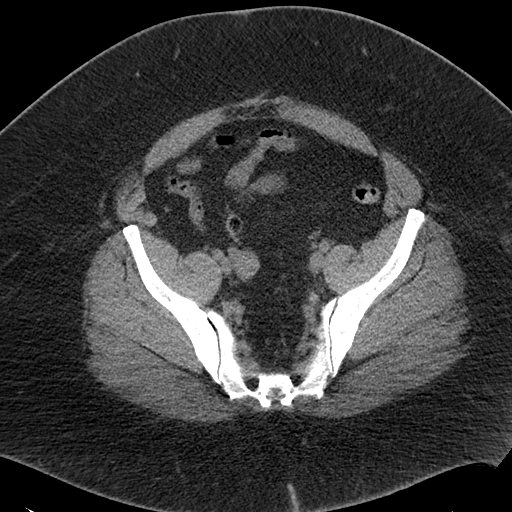
[im 31/92  soft-tissue]
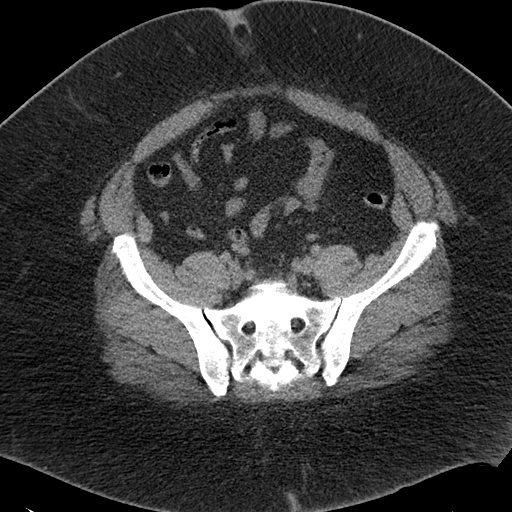
[im 38/92  soft-tissue]
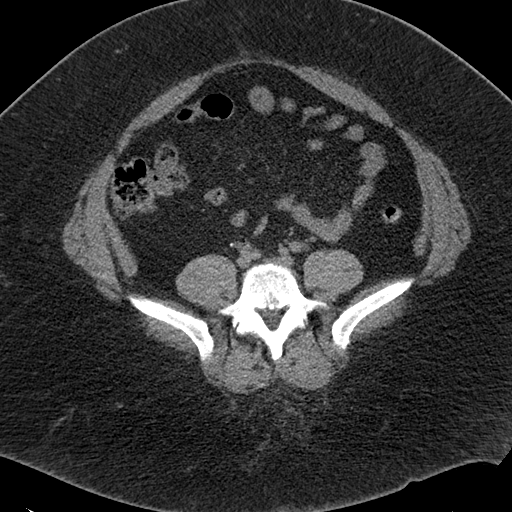
[im 46/92  soft-tissue]
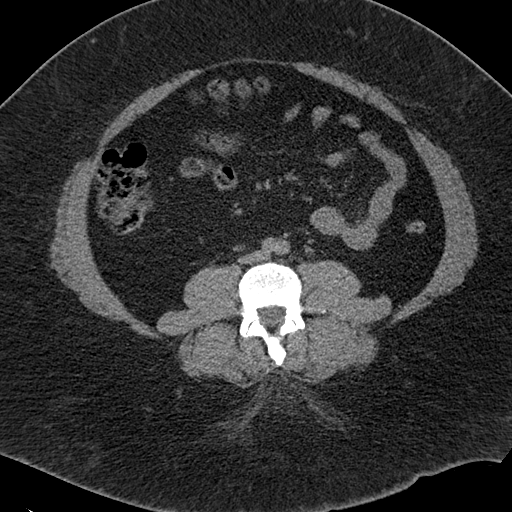
[im 54/92  soft-tissue]
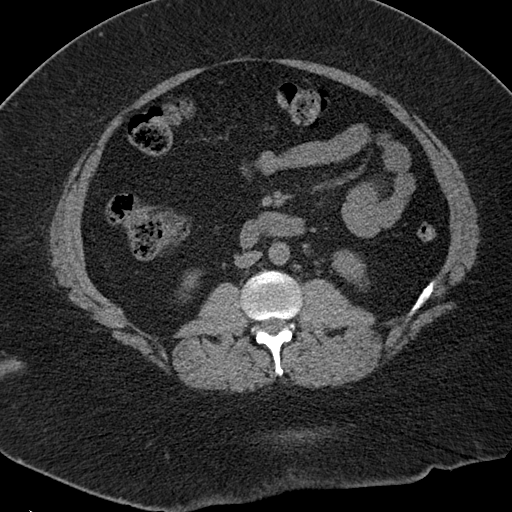
[im 61/92  soft-tissue]
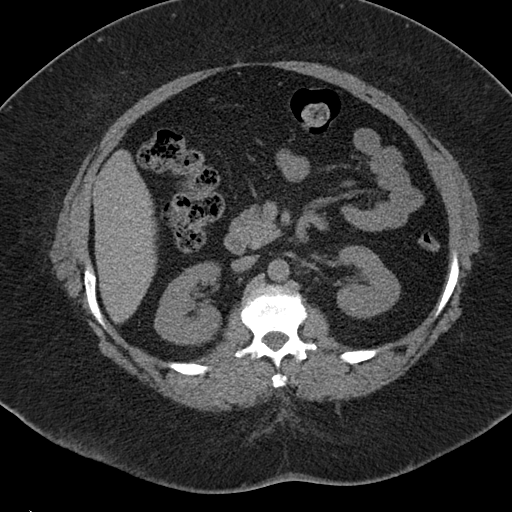
[im 61/92  bone]
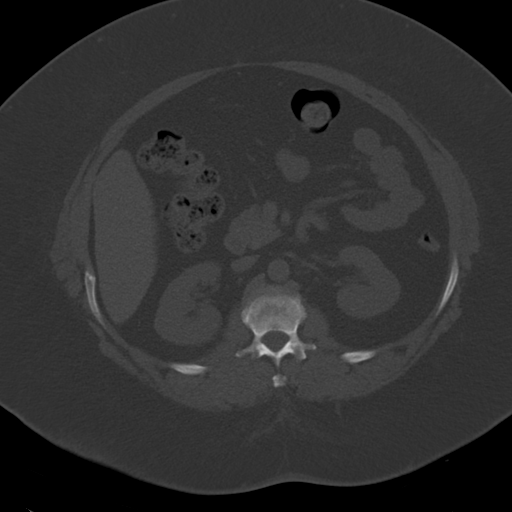
[im 65/92  soft-tissue]
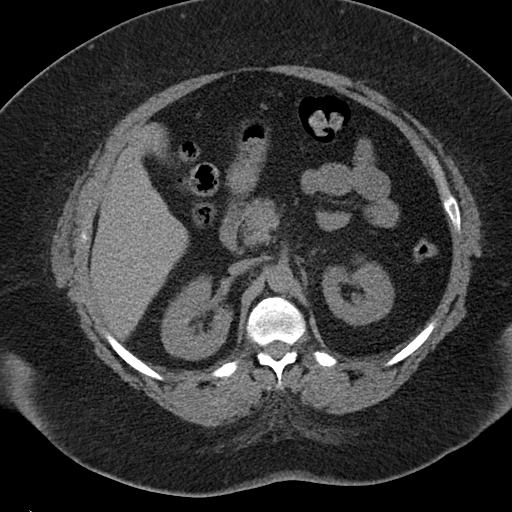
[im 73/92  soft-tissue]
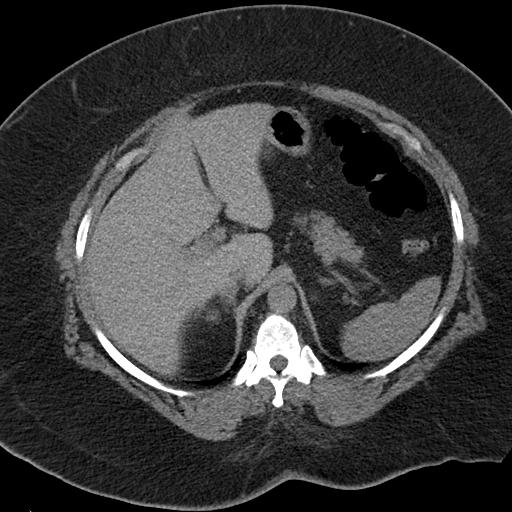
[im 80/92  soft-tissue]
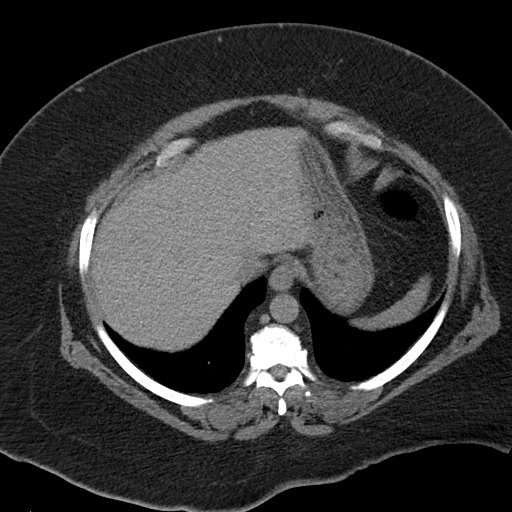
[im 88/92  soft-tissue]
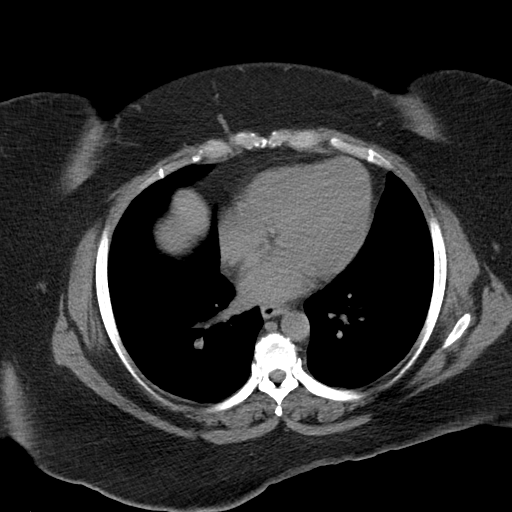

[Series 5: coronal soft tissue · coronal · 0.78mm/px · 3 of 165 slices shown]
[im 55/165  soft-tissue]
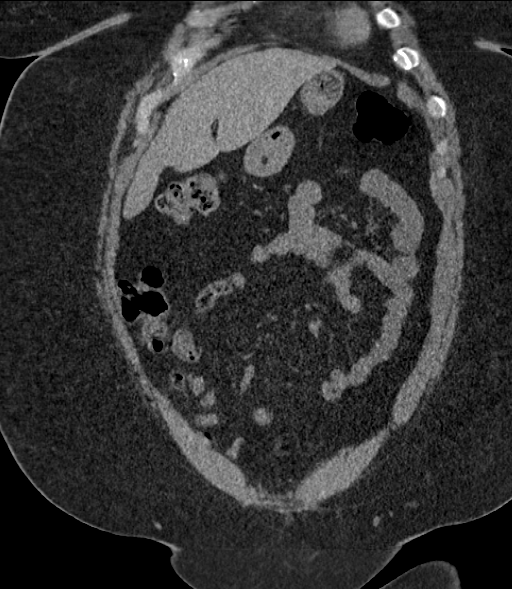
[im 73/165  soft-tissue]
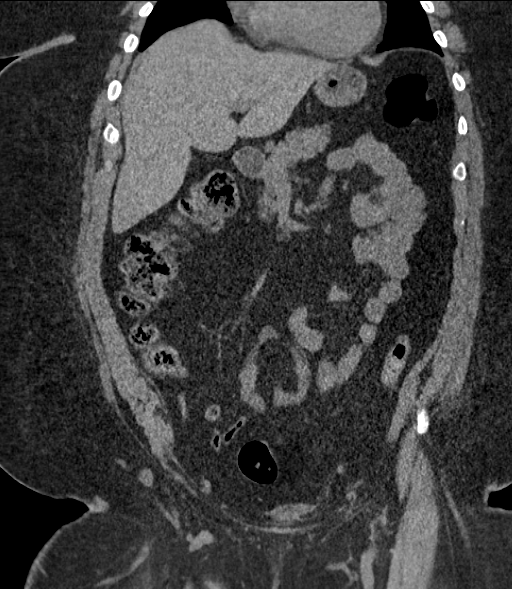
[im 92/165  soft-tissue]
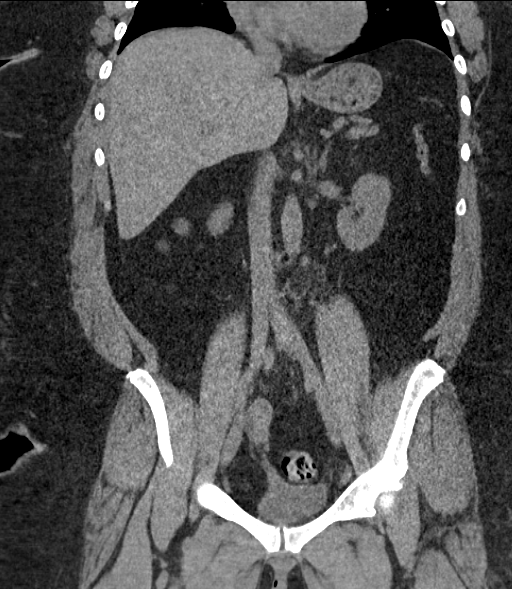

[16 of 46 positions shown; findings below may reference images not displayed]

FINDINGS: LUNG BASES: Included view of the lung bases are clear. The
visualized heart and pericardium are unremarkable.

KIDNEYS/BLADDER: Kidneys are orthotopic, demonstrating normal size
and morphology. No nephrolithiasis, hydronephrosis; limited
assessment for renal masses on this nonenhanced examination. 2.5 cm
simple cyst exophytic from upper pole of RIGHT kidney. 13 mm cyst
exophytic lower pole of LEFT kidney. The unopacified ureters are
normal in course and caliber. Urinary bladder is partially distended
and unremarkable.

SOLID ORGANS: The liver, spleen, pancreas and adrenal glands are
unremarkable for this non-contrast examination. Status post
cholecystectomy.

GASTROINTESTINAL TRACT: The stomach, small and large bowel are
normal in course and caliber without inflammatory changes, the
sensitivity may be decreased by lack of enteric contrast. Normal
appendix.

PERITONEUM/RETROPERITONEUM: Aortoiliac vessels are normal in course
and caliber, minimal calcific atherosclerosis. 13 mm short axis
portal caval lymph node is likely reactive. Small mesenteric lymph
nodes. Status post hysterectomy. No intraperitoneal free fluid nor
free air. Phleboliths in the pelvis.

SOFT TISSUES/ OSSEOUS STRUCTURES: Nonsuspicious. Small to moderate
fat containing umbilical hernia. Anterior abdominal wall scarring.
Moderate lower lumbar facet arthropathy.
IMPRESSION: No urolithiasis, obstructive uropathy nor acute intra-abdominal/
pelvic process.

## 2016-09-19 ENCOUNTER — Encounter (HOSPITAL_COMMUNITY): Payer: Self-pay | Admitting: Emergency Medicine

## 2016-09-19 ENCOUNTER — Emergency Department (HOSPITAL_COMMUNITY)
Admission: EM | Admit: 2016-09-19 | Discharge: 2016-09-20 | Disposition: A | Discharge status: Home / Self Care | Payer: Medicaid Other | Attending: Emergency Medicine | Admitting: Emergency Medicine

## 2016-09-19 DIAGNOSIS — R109 Unspecified abdominal pain: Secondary | ICD-10-CM | POA: Diagnosis present

## 2016-09-19 DIAGNOSIS — Z8673 Personal history of transient ischemic attack (TIA), and cerebral infarction without residual deficits: Secondary | ICD-10-CM | POA: Insufficient documentation

## 2016-09-19 DIAGNOSIS — E876 Hypokalemia: Secondary | ICD-10-CM | POA: Diagnosis not present

## 2016-09-19 DIAGNOSIS — I10 Essential (primary) hypertension: Secondary | ICD-10-CM | POA: Insufficient documentation

## 2016-09-19 DIAGNOSIS — R1012 Left upper quadrant pain: Secondary | ICD-10-CM

## 2016-09-19 LAB — COMPREHENSIVE METABOLIC PANEL
ALT: 23 U/L (ref 14–54)
AST: 25 U/L (ref 15–41)
Albumin: 3.8 g/dL (ref 3.5–5.0)
Alkaline Phosphatase: 70 U/L (ref 38–126)
Anion gap: 18 — ABNORMAL HIGH (ref 5–15)
BUN: 6 mg/dL (ref 6–20)
CO2: 22 mmol/L (ref 22–32)
Calcium: 9.7 mg/dL (ref 8.9–10.3)
Chloride: 98 mmol/L — ABNORMAL LOW (ref 101–111)
Creatinine, Ser: 1.08 mg/dL — ABNORMAL HIGH (ref 0.44–1.00)
GFR calc Af Amer: >60 mL/min (ref >60)
GFR calc non Af Amer: 56 mL/min — ABNORMAL LOW (ref >60)
Glucose, Bld: 140 mg/dL — ABNORMAL HIGH (ref 65–99)
Potassium: 2.8 mmol/L — ABNORMAL LOW (ref 3.5–5.1)
Sodium: 138 mmol/L (ref 135–145)
Total Bilirubin: 0.9 mg/dL (ref 0.3–1.2)
Total Protein: 7.1 g/dL (ref 6.5–8.1)

## 2016-09-19 LAB — CBC
HCT: 44.5 % (ref 36.0–46.0)
Hemoglobin: 15.1 g/dL — ABNORMAL HIGH (ref 12.0–15.0)
MCH: 28.2 pg (ref 26.0–34.0)
MCHC: 33.9 g/dL (ref 30.0–36.0)
MCV: 83.2 fL (ref 78.0–100.0)
Platelets: 277 10*3/uL (ref 150–400)
RBC: 5.35 MIL/uL — ABNORMAL HIGH (ref 3.87–5.11)
RDW: 14.9 % (ref 11.5–15.5)
WBC: 13.5 10*3/uL — ABNORMAL HIGH (ref 4.0–10.5)

## 2016-09-19 LAB — LIPASE, BLOOD: Lipase: 27 U/L (ref 11–51)

## 2016-09-19 MED ORDER — FENTANYL CITRATE (PF) 100 MCG/2ML IJ SOLN
50.0000 ug | Freq: Once | INTRAMUSCULAR | Status: AC
  Administered 2016-09-19: 50 ug via NASAL

## 2016-09-19 MED ORDER — ONDANSETRON 4 MG PO TBDP
ORAL_TABLET | ORAL | Status: AC
  Filled 2016-09-19: qty 1

## 2016-09-19 MED ORDER — POTASSIUM CHLORIDE 2 MEQ/ML IV SOLN
30.0000 meq | Freq: Once | INTRAVENOUS | Status: AC
  Administered 2016-09-20: 30 meq via INTRAVENOUS
  Filled 2016-09-19: qty 15

## 2016-09-19 MED ORDER — SODIUM CHLORIDE 0.9 % IV BOLUS (SEPSIS)
1000.0000 mL | Freq: Once | INTRAVENOUS | Status: AC
  Administered 2016-09-19: 1000 mL via INTRAVENOUS

## 2016-09-19 MED ORDER — HYDROMORPHONE HCL 2 MG/ML IJ SOLN
1.0000 mg | Freq: Once | INTRAMUSCULAR | Status: AC
  Administered 2016-09-19: 1 mg via INTRAVENOUS
  Filled 2016-09-19: qty 1

## 2016-09-19 MED ORDER — ONDANSETRON 4 MG PO TBDP
4.0000 mg | ORAL_TABLET | Freq: Once | ORAL | Status: AC
  Administered 2016-09-19: 4 mg via ORAL

## 2016-09-19 MED ORDER — FENTANYL CITRATE (PF) 100 MCG/2ML IJ SOLN
INTRAMUSCULAR | Status: AC
  Filled 2016-09-19: qty 2

## 2016-09-19 NOTE — ED Provider Notes (Signed)
[facility] DEPT Provider Note   CSN: [id number] Arrival date & time: 09/19/16  2107   By signing my name below, I, Dolores Hoose, attest that this documentation has been prepared under the direction and in the presence of Delora Fuel, MD . Electronically Signed: Dolores Hoose, Scribe. 09/19/2016. 11:36 Pm.  History   Chief Complaint Chief Complaint  Patient presents with  . Abdominal Pain   The history is provided by the patient. No language interpreter was used.    HPI Comments:  Jodi Bowman is a 55 y.o. female with pmhx of HTN and Stroke who presents to the Emergency Department complaining of constant worsening left-sided abdominal pain beginning earlier today. She describes her pain as 10/10 and radiating to her back, exacerbated by palpation and deep inhalation. Pt underwent gastric bypass on 08/04/16 and has been having complications since. She was told a portion of her bowel was inflamed and had a tube placed into her left side to drain gastric juices. She has noticed today that the drainage has changed from a clear liquid to a light red. She reports associated nausea, chills and vomiting. Pt denies any fever. She has lost 30 lbs since her gastric bypass surgery.    Past Medical History:  Diagnosis Date  . Arthritis   . Calculus of gallbladder without mention of cholecystitis or obstruction   . Fibromyalgia   . Goiter   . Hypercholesteremia   . Hypertension   . Lower back pain   . Migraine   . Nontoxic uninodular goiter    sees dr vollmer at [facility]  . Obesity   . Sleep apnea    STOPBANG=5  . Stroke [facility]) 2000    Patient Active Problem List   Diagnosis Date Noted  . Chronic low back pain 08/01/2015  . Right hip pain 08/01/2015  . Abnormality of gait 08/01/2015  . Left leg weakness   . Low back pain with radiation   . Syncope 07/30/2014  . Atypical chest pain 03/12/2014  . Lap chole [facility] April 2013 01/29/2012  . Gallstones 12/04/2011  . Thyroid  nodule-non neoplastic goiter by needle aspiration 12/04/2011  . GLUCOSE INTOLERANCE 03/12/2010  . DYSLIPIDEMIA 03/12/2010  . Chronic migraine 03/12/2010  . Carotid stenosis 03/12/2010  . CEREBROVASCULAR ACCIDENT 03/12/2010  . LIPOMA 01/29/2010  . HEADACHE 01/29/2010  . ANKLE INJURY, RIGHT 04/12/2009  . PHARYNGITIS 03/21/2009  . Backache 03/01/2009  . ALLERGIC RHINITIS 03/17/2007  . LOW BACK PAIN 03/17/2007  . Essential hypertension 01/01/2007  . ANXIETY STATE NOS 09/11/2005    Past Surgical History:  Procedure Laterality Date  . ABDOMINAL HYSTERECTOMY    . CESAREAN SECTION  yrs ago   done x 2  . CHOLECYSTECTOMY  01/05/2012   Procedure: LAPAROSCOPIC CHOLECYSTECTOMY WITH INTRAOPERATIVE CHOLANGIOGRAM;  Surgeon: Pedro Earls, MD;  Location: [facility] ORS;  Service: General;  Laterality: N/A;  . COLONOSCOPY  10/08/2012   Procedure: COLONOSCOPY;  Surgeon: Beryle Beams, MD;  Location: [facility] ENDOSCOPY;  Service: Endoscopy;  Laterality: N/A;  . KNEE ARTHROSCOPY  one 1995 and 1 in 1997   both knees done  . surgery for endometriosis  yrs ago  . thryoid biopsy  December 01, 2011    at [facility]    OB History    No data available       Home Medications    Prior to Admission medications   Medication Sig Start Date End Date Taking? Authorizing Provider  acetaminophen (TYLENOL) 500 MG tablet Take 1,000 mg  by mouth every 6 (six) hours as needed for moderate pain or headache.    Historical Provider, MD  amLODipine (NORVASC) 10 MG tablet Take 10 mg by mouth daily. 11/29/14   Historical Provider, MD  Cholecalciferol 50000 units capsule Take 50,000 Units by mouth every Friday.    Historical Provider, MD  DULoxetine (CYMBALTA) 30 MG capsule Take 3 capsules (90 mg total) by mouth daily. Patient taking differently: Take 30-60 mg by mouth 2 (two) times daily. Take 60mg  in the morning and 30mg  in the evening 08/01/15   Marcial Pacas, MD  loratadine (CLARITIN) 10 MG tablet Take 10 mg by mouth daily.     Historical  Provider, MD  methylPREDNISolone (MEDROL DOSEPAK) 4 MG TBPK tablet Take as directed on packet Patient not taking: Reported on 05/06/2016 01/12/16   [name], MD  metoprolol succinate (TOPROL-XL) 100 MG 24 hr tablet Take 1 tablet (100 mg total) by mouth daily. Take with or immediately following a meal. Patient taking differently: Take 50-100 mg by mouth See admin instructions. Pt takes 100mg  in am and 50mg  in pm 03/13/14   Charolette Forward, MD  nitroGLYCERIN (NITROSTAT) 0.4 MG SL tablet Place 1 tablet (0.4 mg total) under the tongue every 5 (five) minutes x 3 doses as needed for chest pain. Patient taking differently: Place 0.4 mg under the tongue every 5 (five) minutes as needed for chest pain. Maximum 3 doses 03/13/14   Charolette Forward, MD  pantoprazole (PROTONIX) 40 MG tablet Take 40 mg by mouth 2 (two) times daily.     Historical Provider, MD  pregabalin (LYRICA) 75 MG capsule Take 75-150 mg by mouth 2 (two) times daily. Take 2 tablets (150 mg) every morning and 1 tablet (75 mg) at bedtime    Historical Provider, MD  sucralfate (CARAFATE) 1 g tablet Take 1 tablet (1 g total) by mouth 4 (four) times daily. 10/25/15   Tanna Furry, MD  topiramate (TOPAMAX) 100 MG tablet Take 100 mg by mouth 3 (three) times daily.  01/28/15   Historical Provider, MD  valsartan-hydrochlorothiazide (DIOVAN-HCT) 160-25 MG tablet Take 1 tablet by mouth daily. 09/19/15   Historical Provider, MD    Family History Family History  Problem Relation Age of Onset  . Diabetes Mother   . Hypertension Mother   . Transient ischemic attack Mother   . Seizures Mother   . Cancer Brother     colon and lung  . Cancer Maternal Grandmother     colon    Social History Social History  Substance Use Topics  . Smoking status: Never Smoker  . Smokeless tobacco: Never Used  . Alcohol use 0.0 oz/week     Comment: Occasionally     Allergies   Shrimp [shellfish allergy] and Naproxen   Review of Systems Review of Systems    Constitutional: Positive for chills.  Gastrointestinal: Positive for abdominal pain, nausea and vomiting.  All other systems reviewed and are negative.    Physical Exam Updated Vital Signs BP (!) 154/107 (BP Location: Left Arm)   Pulse (!) 130   Temp 98.5 F (36.9 C) (Oral)   Resp 24   Ht 4\' 11"  (1.499 m)   Wt 227 lb (103 kg)   SpO2 100%   BMI 45.85 kg/m   Physical Exam  Constitutional: She is oriented to person, place, and time. She appears well-developed and well-nourished.  Appears uncomfortable  HENT:  Head: Normocephalic and atraumatic.  Eyes: EOM are normal. Pupils are equal, round, and  reactive to light.  Neck: Normal range of motion. Neck supple. No JVD present.  Cardiovascular: Normal rate, regular rhythm and normal heart sounds.   No murmur heard. Pulmonary/Chest: Effort normal and breath sounds normal. She has no wheezes. She has no rales. She exhibits no tenderness.  Abdominal: Soft. She exhibits no distension and no mass. There is tenderness.  Drain present on left lateral abdomen with bloody drainage. Marked tenderness to left abdomen. Bowel sounds decreased.  Musculoskeletal: Normal range of motion. She exhibits no edema.  Lymphadenopathy:    She has no cervical adenopathy.  Neurological: She is alert and oriented to person, place, and time. No cranial nerve deficit. She exhibits normal muscle tone. Coordination normal.  Skin: Skin is warm and dry. No rash noted.  Psychiatric: She has a normal mood and affect. Her behavior is normal. Judgment and thought content normal.  Nursing note and vitals reviewed.    ED Treatments / Results  DIAGNOSTIC STUDIES:  Oxygen Saturation is 100% on RA, normal by my interpretation.    COORDINATION OF CARE:  11:47 PM Discussed treatment plan with pt at bedside which includes potassium, IV fluids, nausea medication and a CTA and pt agreed to plan.  Labs (all labs ordered are listed, but only abnormal results are  displayed) Labs Reviewed  COMPREHENSIVE METABOLIC PANEL - Abnormal; Notable for the following:       Result Value   Potassium 2.8 (*)    Chloride 98 (*)    Glucose, Bld 140 (*)    Creatinine, Ser 1.08 (*)    GFR calc non Af Amer 56 (*)    Anion gap 18 (*)    All other components within normal limits  CBC - Abnormal; Notable for the following:    WBC 13.5 (*)    RBC 5.35 (*)    Hemoglobin 15.1 (*)    All other components within normal limits  DIFFERENTIAL - Abnormal; Notable for the following:    Neutro Abs 9.2 (*)    Monocytes Absolute 1.5 (*)    All other components within normal limits  I-STAT CG4 LACTIC ACID, ED - Abnormal; Notable for the following:    Lactic Acid, Venous 2.60 (*)    All other components within normal limits  LIPASE, BLOOD  I-STAT CG4 LACTIC ACID, ED    Radiology Ct Abdomen Pelvis W Contrast  Result Date: 09/20/2016 CLINICAL DATA:  Abdominal pain. Recent surgical procedures on the stomach. EXAM: CT ABDOMEN AND PELVIS WITH CONTRAST TECHNIQUE: Multidetector CT imaging of the abdomen and pelvis was performed using the standard protocol following bolus administration of intravenous contrast. CONTRAST:  152mL ISOVUE-300 IOPAMIDOL (ISOVUE-300) INJECTION 61% COMPARISON:  10/25/2015 FINDINGS: Lower chest: Minimal posterior base opacities, likely atelectatic. No consolidation. No effusions. Hepatobiliary: Cholecystectomy.  Liver and bile ducts are normal. Pancreas: Unremarkable. No pancreatic ductal dilatation or surrounding inflammatory changes. Spleen: Normal in size without focal abnormality. Adrenals/Urinary Tract: Adrenal glands are unremarkable. 2.6 cm benign cyst of the right upper pole. Small hypodense lesions elsewhere in both kidneys are probably also cysts but not conclusively characterized. The kidneys are otherwise normal, without renal calculi, suspicious parenchymal lesion, or hydronephrosis. Bladder is unremarkable. Stomach/Bowel: Gastric bypass with  anticolic gastrojejunostomy. There is a percutaneous pigtail catheter in the excluded portion of the gastric lumen from a left lateral approach. Anastomoses appear intact. No evidence of anastomotic leak. No bowel obstruction. No extraluminal air. No focal inflammatory changes involving bowel. No significant fluid or air within the lumen of  the excluded portion of the stomach. Vascular/Lymphatic: No significant vascular findings are present. No enlarged abdominal or pelvic lymph nodes. Reproductive: Status post hysterectomy. No adnexal masses. Other: No acute inflammatory changes in the abdomen or pelvis. No ascites. Musculoskeletal: No significant skeletal lesions. Small fat containing umbilical hernia. IMPRESSION: 1. Gastric bypass with unremarkable appearances of the anastomoses. A pigtail catheter resides within the lumen of the excluded portion of the stomach. No fluid or air within the lumen of the excluded portion of the stomach. 2. No bowel obstruction. No extraluminal air. No focal inflammatory changes involving bowel. 3. No acute findings are evident in the abdomen or pelvis. Electronically Signed   By: Andreas Newport M.D.   On: 09/20/2016 01:07    Procedures Procedures (including critical care time)  Medications Ordered in ED Medications  ondansetron (ZOFRAN-ODT) disintegrating tablet 4 mg (4 mg Oral Given 09/19/16 2132)  fentaNYL (SUBLIMAZE) injection 50 mcg (50 mcg Nasal Given 09/19/16 2137)  sodium chloride 0.9 % bolus 1,000 mL (0 mLs Intravenous Stopped 09/20/16 0058)  potassium chloride 30 mEq in sodium chloride 0.9 % 265 mL (KCL MULTIRUN) IVPB (30 mEq Intravenous Given 09/20/16 0102)  HYDROmorphone (DILAUDID) injection 1 mg (1 mg Intravenous Given 09/19/16 2358)  iopamidol (ISOVUE-300) 61 % injection (100 mLs  Contrast Given 09/20/16 0030)  HYDROmorphone (DILAUDID) injection 1 mg (1 mg Intravenous Given 09/20/16 0248)     Initial Impression / Assessment and Plan / ED Course  I  have reviewed the triage vital signs and the nursing notes.  Pertinent labs & imaging results that were available during my care of the patient were reviewed by me and considered in my medical decision making (see chart for details).  Clinical Course    Severe abdominal pain following placement of drain. 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 Earlier today, she had a percutaneous drain placed into a blind pouch of the stomach which had been distended. She is given IV fluids and hydromorphone for pain with good relief. She is sent for CT scan which confirms drain is still located in the blind pouch of the stomach and no other acute process identified. I discussed case with her surgeon, Dr. Volanda Napoleon who feels that she should go home with analgesics and follow-up with him in the next week. I have relayed this information to patient. Laboratory workup did show significant hypokalemia and she's given intravenous and oral potassium and is sent home with prescription for K-Dur.  Final Clinical Impressions(s) / ED Diagnoses   Final diagnoses:  LUQ abdominal pain  Hypokalemia    New Prescriptions New Prescriptions   ONDANSETRON (ZOFRAN) 4 MG TABLET    Take 1 tablet (4 mg total) by mouth every 6 (six) hours as needed for nausea or vomiting.   OXYCODONE-ACETAMINOPHEN (PERCOCET) 5-325 MG TABLET    Take 1 tablet by mouth every 4 (four) hours as needed for moderate pain.   POTASSIUM CHLORIDE SA (K-DUR,KLOR-CON) 20 MEQ TABLET    Take 1 tablet (20 mEq total) by mouth 2 (two) times daily.   I personally performed the services described in this documentation, which was scribed in my presence. The recorded information has been reviewed and is accurate.       Delora Fuel, MD [phone]

## 2016-09-19 NOTE — ED Triage Notes (Signed)
Pt presents to ED for assessment of left-sided abdominal pain after having a drain placed in her upper left abdomen to drain additional gastric juices.  Pt states she had gastric bypass in November and has been having complications ever since.  Pt sts drainage was clear earlier today at facility.  They told her the pain was from muscle spasms due to the surgery.  Pt sts pain has been worsening, now radiating to her back and drainage from bag is now red.

## 2016-09-20 ENCOUNTER — Emergency Department (HOSPITAL_COMMUNITY): Payer: Medicaid Other

## 2016-09-20 LAB — DIFFERENTIAL
Basophils Absolute: 0 10*3/uL (ref 0.0–0.1)
Basophils Relative: 0 %
Eosinophils Absolute: 0 10*3/uL (ref 0.0–0.7)
Eosinophils Relative: 0 %
Lymphocytes Relative: 14 %
Lymphs Abs: 1.8 10*3/uL (ref 0.7–4.0)
Monocytes Absolute: 1.5 10*3/uL — ABNORMAL HIGH (ref 0.1–1.0)
Monocytes Relative: 12 %
Neutro Abs: 9.2 10*3/uL — ABNORMAL HIGH (ref 1.7–7.7)
Neutrophils Relative %: 74 %

## 2016-09-20 LAB — I-STAT CG4 LACTIC ACID, ED
Lactic Acid, Venous: 1.06 mmol/L (ref 0.5–1.9)
Lactic Acid, Venous: 2.6 mmol/L (ref 0.5–1.9)

## 2016-09-20 MED ORDER — IOPAMIDOL (ISOVUE-300) INJECTION 61%
INTRAVENOUS | Status: AC
  Administered 2016-09-20: 100 mL
  Filled 2016-09-20: qty 100

## 2016-09-20 MED ORDER — HYDROMORPHONE HCL 2 MG/ML IJ SOLN
1.0000 mg | Freq: Once | INTRAMUSCULAR | Status: AC
  Administered 2016-09-20: 1 mg via INTRAVENOUS
  Filled 2016-09-20: qty 1

## 2016-09-20 MED ORDER — ONDANSETRON HCL 4 MG PO TABS: 4.0000 mg | ORAL_TABLET | Freq: Four times a day (QID) | ORAL | 0 refills | Status: DC | PRN

## 2016-09-20 MED ORDER — POTASSIUM CHLORIDE CRYS ER 20 MEQ PO TBCR: 20.0000 meq | EXTENDED_RELEASE_TABLET | Freq: Two times a day (BID) | ORAL | 0 refills | Status: DC

## 2016-09-20 MED ORDER — OXYCODONE-ACETAMINOPHEN 5-325 MG PO TABS: 1.0000 | ORAL_TABLET | ORAL | 0 refills | Status: DC | PRN

## 2016-09-20 NOTE — ED Notes (Signed)
Pt understood dc material. NAD noted. Scripts given at dc 

## 2016-09-20 NOTE — Discharge Instructions (Signed)
Your abdominal pain is because the drain is up against the wall of the stomach. This should subside over the next 1-2 days. If pain is not being adequately controlled at home, he may return to emergency department for further evaluation. Otherwise, follow-up with your surgeon.

## 2016-09-25 IMAGING — CT CT ABD-PELV W/ CM
2 of 5 series · 17 of 46 positions shown, 19 images · IV contrast (APPLIED)
Comparison: October 16, 2015

CLINICAL DATA: Right-sided pain since yesterday.

EXAM:
CT ABDOMEN AND PELVIS WITH CONTRAST
TECHNIQUE: Multidetector CT imaging of the abdomen and pelvis was performed
using the standard protocol following bolus administration of
intravenous contrast.
CONTRAST:  100mL OMNIPAQUE IOHEXOL 300 MG/ML  SOLN

[Series 2: abd/ pelvis 5.0 i30f 1 · axial · 0.90mm/px · z∈[+876,+1316]mm · 14 of 98 slices shown, 16 images]
[im 5/98  soft-tissue]
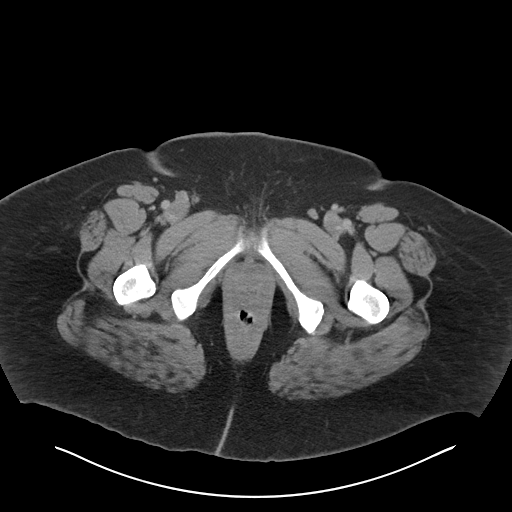
[im 5/98  bone]
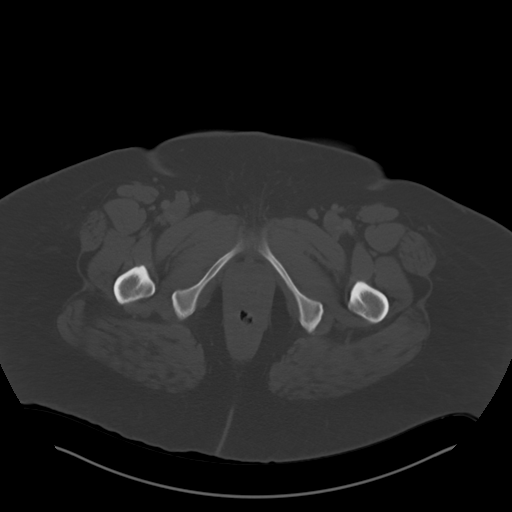
[im 15/98  soft-tissue]
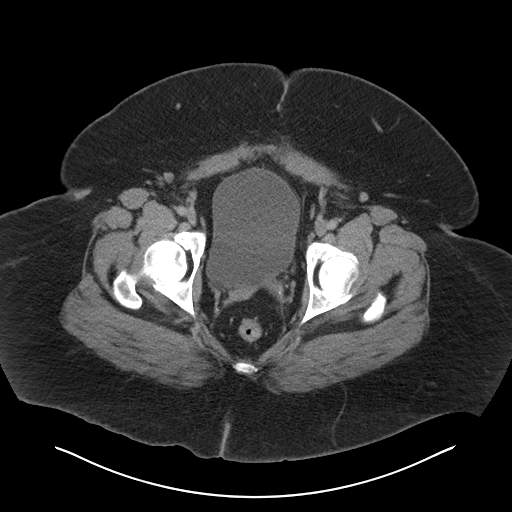
[im 20/98  soft-tissue]
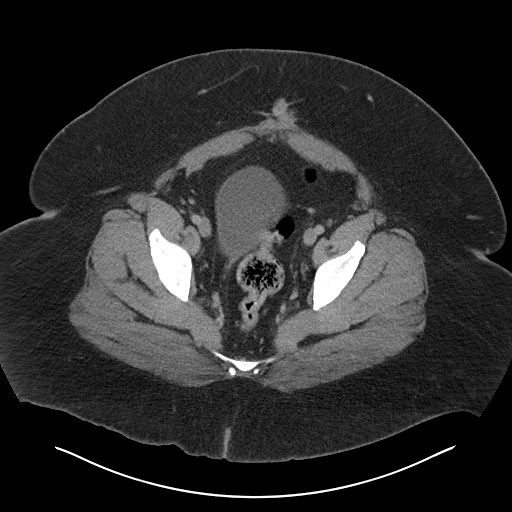
[im 25/98  soft-tissue]
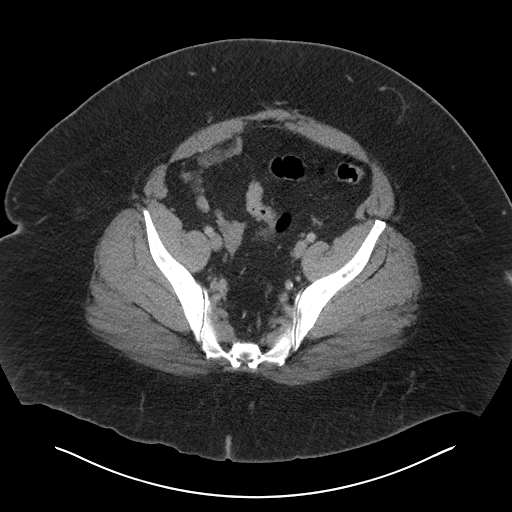
[im 34/98  soft-tissue]
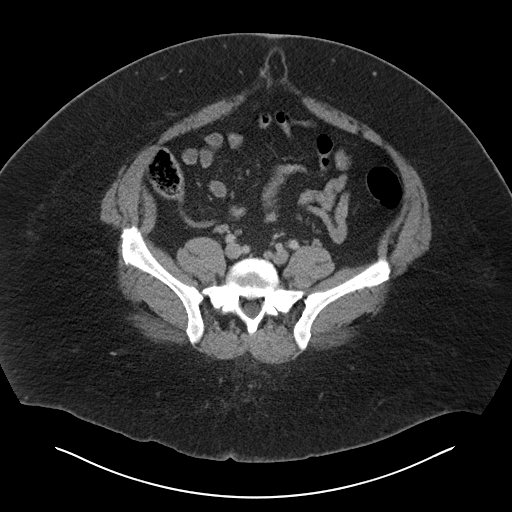
[im 39/98  soft-tissue]
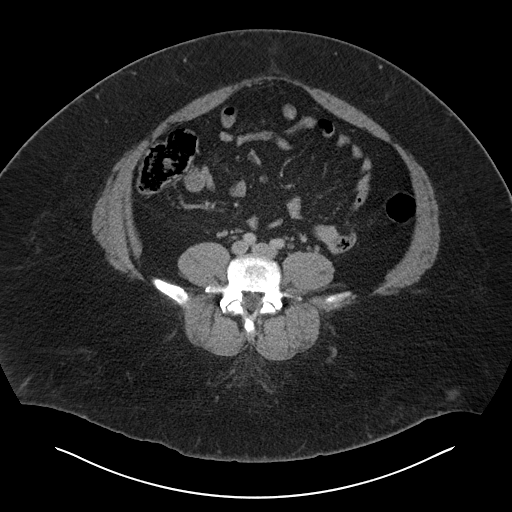
[im 44/98  soft-tissue]
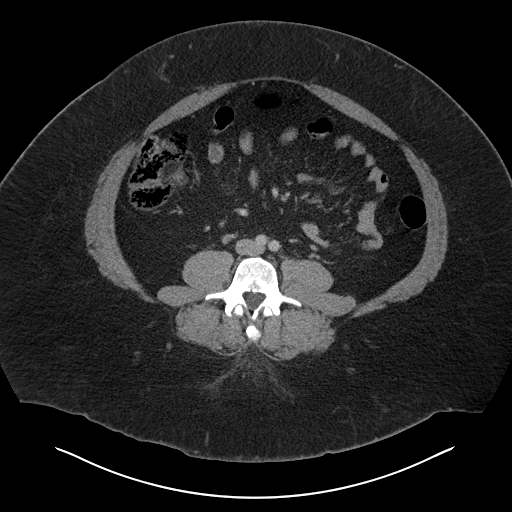
[im 54/98  soft-tissue]
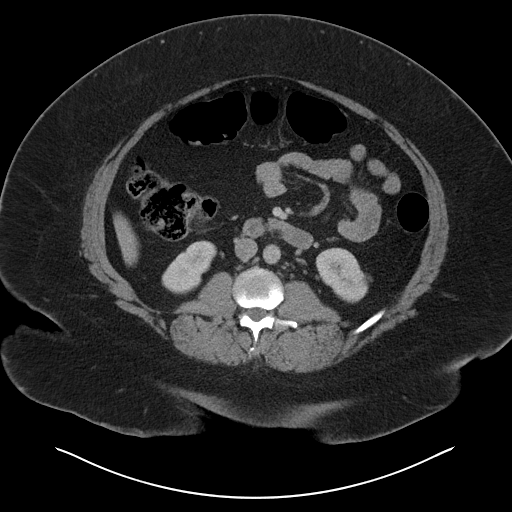
[im 59/98  soft-tissue]
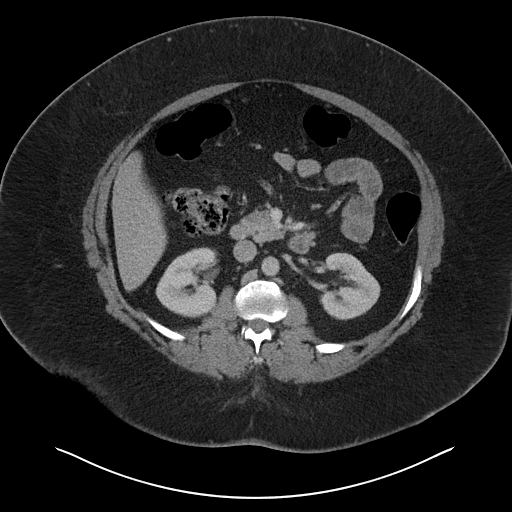
[im 59/98  bone]
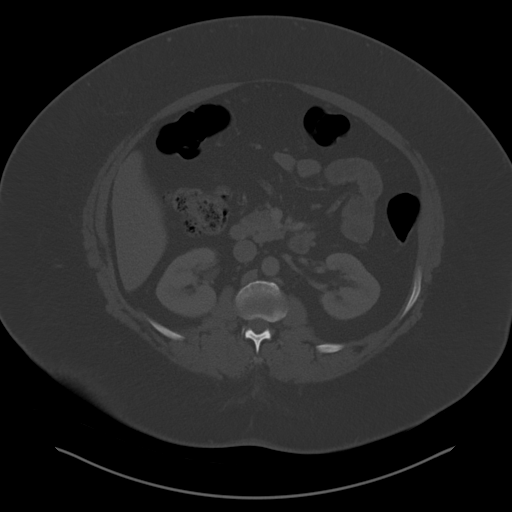
[im 64/98  soft-tissue]
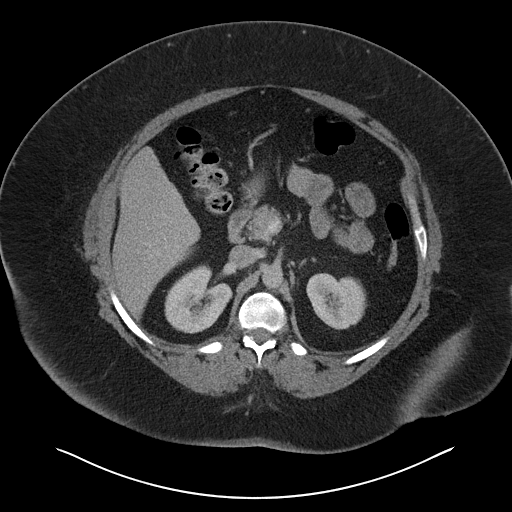
[im 73/98  soft-tissue]
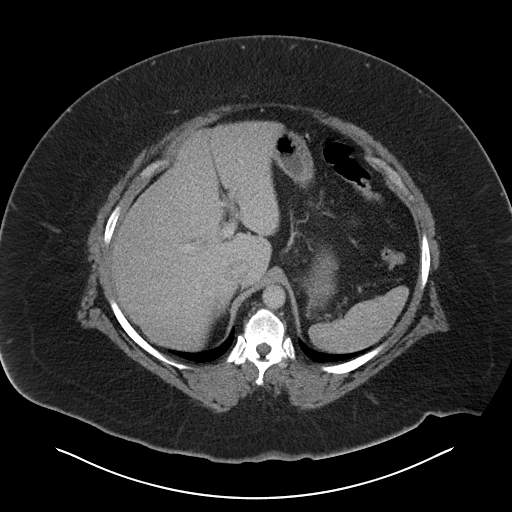
[im 78/98  soft-tissue]
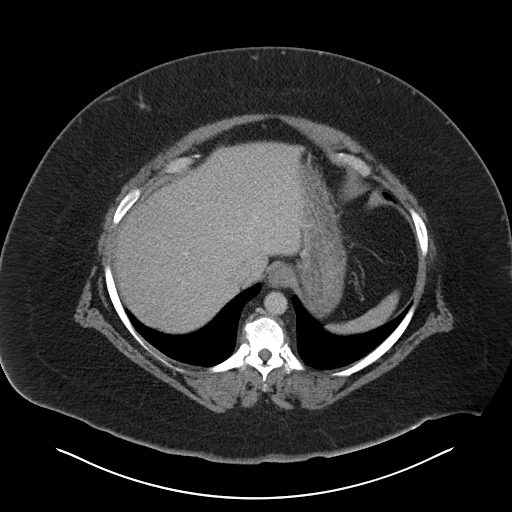
[im 83/98  soft-tissue]
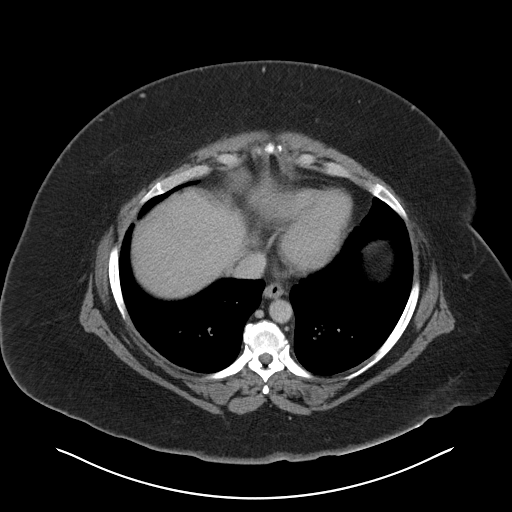
[im 93/98  soft-tissue]
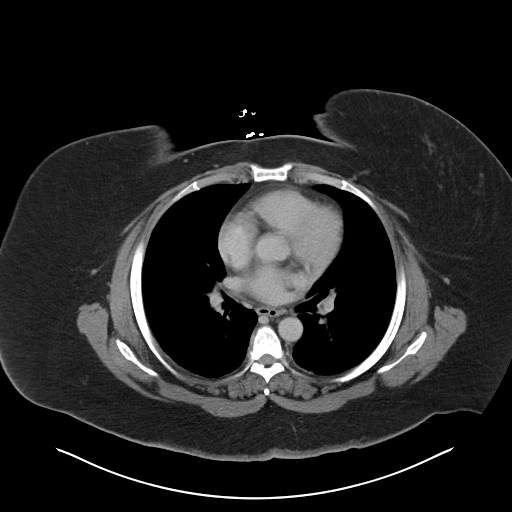

[Series 5: coronal soft tissue · coronal · 0.95mm/px · 3 of 114 slices shown]
[im 38/114  soft-tissue]
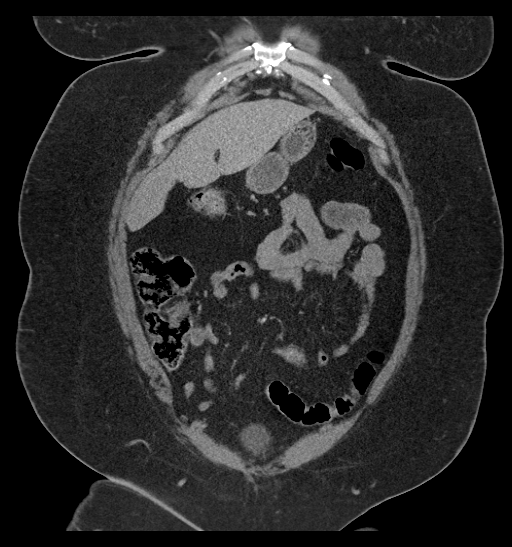
[im 51/114  soft-tissue]
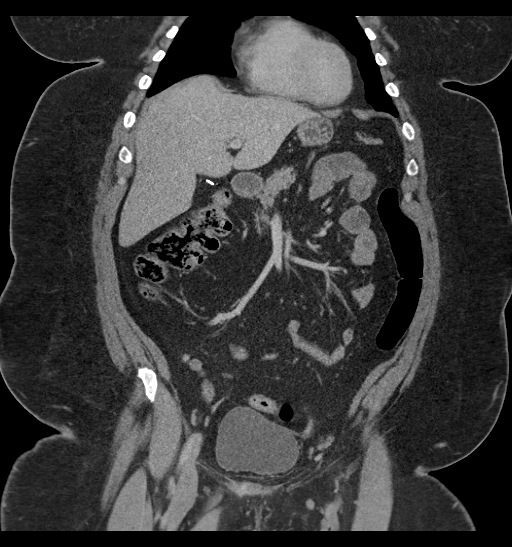
[im 63/114  soft-tissue]
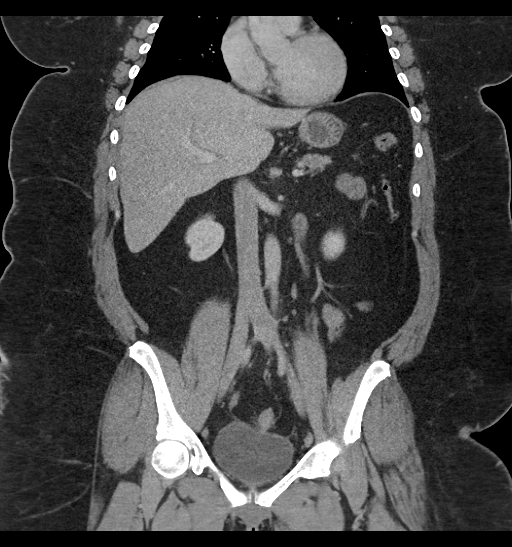

[17 of 46 positions shown; findings below may reference images not displayed]

FINDINGS: Normal lung bases.

No free air or free fluid. There is a fat containing umbilical
hernia. Scar tissue is seen in the deep subcutaneous tissues at the
midline of the lower abdomen and pelvis. No other ventral wall
defects are seen.

The patient is status post cholecystectomy. The liver, portal vein,
spleen, adrenal glands, and pancreas are normal. Bilateral renal
cysts are stable. No suspicious solid masses. No hydronephrosis,
perinephric stranding, or stones. No ureterectasis or ureteral
stones.

The abdominal aorta is normal in caliber with no dissection. No
adenopathy is identified. The stomach and small bowel are within
normal limits. The colon and appendix are normal as well.

The patient is status post hysterectomy. The right ovary appears to
been retained with no mass. The bladder is normal. No adenopathy or
mass in the pelvis.

No acute bony abnormalities are seen. There are mild degenerative
changes in the spine.

Delayed images demonstrate no filling defects within the upper renal
collecting systems.
IMPRESSION: 1. No acute abnormalities.  See above for incidental findings.

## 2016-10-03 ENCOUNTER — Emergency Department (HOSPITAL_COMMUNITY): Payer: Medicaid Other

## 2016-10-03 ENCOUNTER — Emergency Department (HOSPITAL_COMMUNITY)
Admission: EM | Admit: 2016-10-03 | Discharge: 2016-10-04 | Disposition: A | Discharge status: Home / Self Care | Payer: Medicaid Other | Attending: Emergency Medicine | Admitting: Emergency Medicine

## 2016-10-03 ENCOUNTER — Encounter (HOSPITAL_COMMUNITY): Payer: Self-pay

## 2016-10-03 DIAGNOSIS — R1031 Right lower quadrant pain: Secondary | ICD-10-CM | POA: Insufficient documentation

## 2016-10-03 DIAGNOSIS — Z79899 Other long term (current) drug therapy: Secondary | ICD-10-CM | POA: Diagnosis not present

## 2016-10-03 DIAGNOSIS — R109 Unspecified abdominal pain: Secondary | ICD-10-CM | POA: Diagnosis present

## 2016-10-03 DIAGNOSIS — Z8673 Personal history of transient ischemic attack (TIA), and cerebral infarction without residual deficits: Secondary | ICD-10-CM | POA: Diagnosis not present

## 2016-10-03 DIAGNOSIS — I10 Essential (primary) hypertension: Secondary | ICD-10-CM | POA: Diagnosis not present

## 2016-10-03 LAB — CBC WITH DIFFERENTIAL/PLATELET
Basophils Absolute: 0 10*3/uL (ref 0.0–0.1)
Basophils Relative: 0 %
Eosinophils Absolute: 0 10*3/uL (ref 0.0–0.7)
Eosinophils Relative: 0 %
HCT: 39 % (ref 36.0–46.0)
Hemoglobin: 13.4 g/dL (ref 12.0–15.0)
Lymphocytes Relative: 36 %
Lymphs Abs: 3.2 10*3/uL (ref 0.7–4.0)
MCH: 28.1 pg (ref 26.0–34.0)
MCHC: 34.4 g/dL (ref 30.0–36.0)
MCV: 81.8 fL (ref 78.0–100.0)
Monocytes Absolute: 1.2 10*3/uL — ABNORMAL HIGH (ref 0.1–1.0)
Monocytes Relative: 13 %
Neutro Abs: 4.6 10*3/uL (ref 1.7–7.7)
Neutrophils Relative %: 51 %
Platelets: 195 10*3/uL (ref 150–400)
RBC: 4.77 MIL/uL (ref 3.87–5.11)
RDW: 15.8 % — ABNORMAL HIGH (ref 11.5–15.5)
WBC: 9 10*3/uL (ref 4.0–10.5)

## 2016-10-03 LAB — COMPREHENSIVE METABOLIC PANEL
ALT: 62 U/L — ABNORMAL HIGH (ref 14–54)
AST: 57 U/L — ABNORMAL HIGH (ref 15–41)
Albumin: 2.7 g/dL — ABNORMAL LOW (ref 3.5–5.0)
Alkaline Phosphatase: 87 U/L (ref 38–126)
Anion gap: 11 (ref 5–15)
BUN: 14 mg/dL (ref 6–20)
CO2: 21 mmol/L — ABNORMAL LOW (ref 22–32)
Calcium: 8.9 mg/dL (ref 8.9–10.3)
Chloride: 103 mmol/L (ref 101–111)
Creatinine, Ser: 0.76 mg/dL (ref 0.44–1.00)
GFR calc Af Amer: >60 mL/min (ref >60)
GFR calc non Af Amer: >60 mL/min (ref >60)
Glucose, Bld: 97 mg/dL (ref 65–99)
Potassium: 3.7 mmol/L (ref 3.5–5.1)
Sodium: 135 mmol/L (ref 135–145)
Total Bilirubin: 0.7 mg/dL (ref 0.3–1.2)
Total Protein: 6.8 g/dL (ref 6.5–8.1)

## 2016-10-03 LAB — URINALYSIS, ROUTINE W REFLEX MICROSCOPIC
Bilirubin Urine: NEGATIVE
Glucose, UA: NEGATIVE mg/dL
Hgb urine dipstick: NEGATIVE
Ketones, ur: 5 mg/dL — AB
Leukocytes, UA: NEGATIVE
Nitrite: NEGATIVE
Protein, ur: NEGATIVE mg/dL
Specific Gravity, Urine: 1.026 (ref 1.005–1.030)
pH: 5 (ref 5.0–8.0)

## 2016-10-03 LAB — LIPASE, BLOOD: Lipase: 21 U/L (ref 11–51)

## 2016-10-03 MED ORDER — IOPAMIDOL (ISOVUE-300) INJECTION 61%
INTRAVENOUS | Status: AC
  Administered 2016-10-03: 100 mL
  Filled 2016-10-03: qty 100

## 2016-10-03 MED ORDER — OXYCODONE-ACETAMINOPHEN 5-325 MG PO TABS
ORAL_TABLET | ORAL | Status: AC
  Administered 2016-10-04: 1 via ORAL
  Filled 2016-10-03: qty 1

## 2016-10-03 MED ORDER — HYDROMORPHONE HCL 2 MG/ML IJ SOLN
1.0000 mg | Freq: Once | INTRAMUSCULAR | Status: AC
  Administered 2016-10-03: 1 mg via INTRAVENOUS
  Filled 2016-10-03: qty 1

## 2016-10-03 MED ORDER — OXYCODONE-ACETAMINOPHEN 5-325 MG PO TABS
1.0000 | ORAL_TABLET | ORAL | Status: AC | PRN
  Administered 2016-10-03 – 2016-10-04 (×2): 1 via ORAL
  Filled 2016-10-03: qty 1

## 2016-10-03 MED ORDER — ONDANSETRON HCL 4 MG/2ML IJ SOLN
4.0000 mg | Freq: Once | INTRAMUSCULAR | Status: AC
  Administered 2016-10-03: 4 mg via INTRAVENOUS
  Filled 2016-10-03: qty 2

## 2016-10-03 NOTE — ED Notes (Signed)
Patient transported to CT 

## 2016-10-03 NOTE — ED Notes (Signed)
Pt states PICC line placed by [facility]. EDP made aware. XR for placement ordered; labs and meds will be drawn and given after placement verified.

## 2016-10-03 NOTE — ED Provider Notes (Signed)
[facility] DEPT Provider Note   CSN: [id number] Arrival date & time: 10/03/16  1316     History   Chief Complaint Chief Complaint  Patient presents with  . Abdominal Pain    HPI Jodi Bowman is a 56 y.o. female.  Patient presents to the emergency department with chief complaint of abdominal pain. She reports persistent left-sided abdominal pain since having gastric bypass surgery. She had a surgery done in [place]. She had a drain placed in her stomach to drain stomach contents.  He states that this has not helped any. She reports subjective fevers at home, but denies any measured temperature. She reports associated nausea, vomiting, diarrhea. There are no other associated symptoms. Her symptoms are worsened with palpation and movement.  Of note, patient states that she has a PICC line in order to receive heparin due to prolonged immobility.   The history is provided by the patient. No language interpreter was used.    Past Medical History:  Diagnosis Date  . Arthritis   . Calculus of gallbladder without mention of cholecystitis or obstruction   . Fibromyalgia   . Goiter   . Hypercholesteremia   . Hypertension   . Lower back pain   . Migraine   . Nontoxic uninodular goiter    sees dr vollmer at [facility]  . Obesity   . Sleep apnea    STOPBANG=5  . Stroke [facility]) 2000    Patient Active Problem List   Diagnosis Date Noted  . Chronic low back pain 08/01/2015  . Right hip pain 08/01/2015  . Abnormality of gait 08/01/2015  . Left leg weakness   . Low back pain with radiation   . Syncope 07/30/2014  . Atypical chest pain 03/12/2014  . Lap chole [facility] April 2013 01/29/2012  . Gallstones 12/04/2011  . Thyroid nodule-non neoplastic goiter by needle aspiration 12/04/2011  . GLUCOSE INTOLERANCE 03/12/2010  . DYSLIPIDEMIA 03/12/2010  . Chronic migraine 03/12/2010  . Carotid stenosis 03/12/2010  . CEREBROVASCULAR ACCIDENT 03/12/2010  . LIPOMA 01/29/2010  .  HEADACHE 01/29/2010  . ANKLE INJURY, RIGHT 04/12/2009  . PHARYNGITIS 03/21/2009  . Backache 03/01/2009  . ALLERGIC RHINITIS 03/17/2007  . LOW BACK PAIN 03/17/2007  . Essential hypertension 01/01/2007  . ANXIETY STATE NOS 09/11/2005    Past Surgical History:  Procedure Laterality Date  . ABDOMINAL HYSTERECTOMY    . CESAREAN SECTION  yrs ago   done x 2  . CHOLECYSTECTOMY  01/05/2012   Procedure: LAPAROSCOPIC CHOLECYSTECTOMY WITH INTRAOPERATIVE CHOLANGIOGRAM;  Surgeon: Pedro Earls, MD;  Location: [facility];  Service: General;  Laterality: N/A;  . COLONOSCOPY  10/08/2012   Procedure: COLONOSCOPY;  Surgeon: Beryle Beams, MD;  Location: [facility];  Service: Endoscopy;  Laterality: N/A;  . KNEE ARTHROSCOPY  one 1995 and 1 in 1997   both knees done  . surgery for endometriosis  yrs ago  . thryoid biopsy  December 01, 2011    at [facility]    OB History    No data available       Home Medications    Prior to Admission medications   Medication Sig Start Date End Date Taking? Authorizing Provider  acetaminophen (TYLENOL) 500 MG tablet Take 1,000 mg by mouth every 6 (six) hours as needed for moderate pain or headache.    Historical Provider, MD  amLODipine (NORVASC) 10 MG tablet Take 10 mg by mouth daily. 11/29/14   Historical Provider, MD  Cholecalciferol 50000 units capsule Take 50,000 Units  by mouth every Friday.    Historical Provider, MD  DULoxetine (CYMBALTA) 30 MG capsule Take 3 capsules (90 mg total) by mouth daily. Patient taking differently: Take 30-60 mg by mouth 2 (two) times daily. Take 60mg  in the morning and 30mg  in the evening 08/01/15   Marcial Pacas, MD  loratadine (CLARITIN) 10 MG tablet Take 10 mg by mouth daily.     Historical Provider, MD  metoprolol succinate (TOPROL-XL) 100 MG 24 hr tablet Take 1 tablet (100 mg total) by mouth daily. Take with or immediately following a meal. Patient taking differently: Take 50-100 mg by mouth See admin instructions. Pt takes 100mg  in am and  50mg  in pm 03/13/14   Charolette Forward, MD  nitroGLYCERIN (NITROSTAT) 0.4 MG SL tablet Place 1 tablet (0.4 mg total) under the tongue every 5 (five) minutes x 3 doses as needed for chest pain. Patient taking differently: Place 0.4 mg under the tongue every 5 (five) minutes as needed for chest pain. Maximum 3 doses 03/13/14   Charolette Forward, MD  ondansetron (ZOFRAN) 4 MG tablet Take 1 tablet (4 mg total) by mouth every 6 (six) hours as needed for nausea or vomiting. [id number]   Delora Fuel, MD  oxyCODONE-acetaminophen (PERCOCET) 5-325 MG tablet Take 1 tablet by mouth every 4 (four) hours as needed for moderate pain. [id number]   Delora Fuel, MD  pantoprazole (PROTONIX) 40 MG tablet Take 40 mg by mouth 2 (two) times daily.     Historical Provider, MD  potassium chloride SA (K-DUR,KLOR-CON) 20 MEQ tablet Take 1 tablet (20 mEq total) by mouth 2 (two) times daily. [id number]   Delora Fuel, MD  pregabalin (LYRICA) 75 MG capsule Take 75-150 mg by mouth 2 (two) times daily. Take 2 tablets (150 mg) every morning and 1 tablet (75 mg) at bedtime    Historical Provider, MD  sucralfate (CARAFATE) 1 g tablet Take 1 tablet (1 g total) by mouth 4 (four) times daily. 10/25/15   Tanna Furry, MD  topiramate (TOPAMAX) 100 MG tablet Take 100 mg by mouth 3 (three) times daily.  01/28/15   Historical Provider, MD  valsartan-hydrochlorothiazide (DIOVAN-HCT) 160-25 MG tablet Take 1 tablet by mouth daily. 09/19/15   Historical Provider, MD    Family History Family History  Problem Relation Age of Onset  . Diabetes Mother   . Hypertension Mother   . Transient ischemic attack Mother   . Seizures Mother   . Cancer Brother     colon and lung  . Cancer Maternal Grandmother     colon    Social History Social History  Substance Use Topics  . Smoking status: Never Smoker  . Smokeless tobacco: Never Used  . Alcohol use 0.0 oz/week     Comment: Occasionally     Allergies   Shrimp [shellfish allergy] and Naproxen   Review of  Systems Review of Systems  All other systems reviewed and are negative.    Physical Exam Updated Vital Signs BP 143/74 (BP Location: Right Arm)   Pulse 96   Temp 98 F (36.7 C) (Oral)   Resp 18   SpO2 98%   Physical Exam  Constitutional: She is oriented to person, place, and time. She appears well-developed and well-nourished.  HENT:  Head: Normocephalic and atraumatic.  Eyes: Conjunctivae and EOM are normal. Pupils are equal, round, and reactive to light.  Neck: Normal range of motion. Neck supple.  Cardiovascular: Normal rate and regular rhythm.  Exam reveals no gallop and no friction  rub.   No murmur heard. Pulmonary/Chest: Effort normal and breath sounds normal. No respiratory distress. She has no wheezes. She has no rales. She exhibits no tenderness.  Abdominal: Soft. Bowel sounds are normal. She exhibits no distension and no mass. There is tenderness. There is no rebound and no guarding.  Generalized abdominal tenderness, more so in the left upper quadrant, there is a gastric drain with serosanguineous colored discharge  Musculoskeletal: Normal range of motion. She exhibits no edema or tenderness.  Neurological: She is alert and oriented to person, place, and time.  Skin: Skin is warm and dry.  Psychiatric: She has a normal mood and affect. Her behavior is normal. Judgment and thought content normal.  Nursing note and vitals reviewed.    ED Treatments / Results  Labs (all labs ordered are listed, but only abnormal results are displayed) Labs Reviewed  CBC WITH DIFFERENTIAL/PLATELET  COMPREHENSIVE METABOLIC PANEL  LIPASE, BLOOD  URINALYSIS, ROUTINE W REFLEX MICROSCOPIC    EKG  EKG Interpretation None       Radiology No results found.  Procedures Procedures (including critical care time)  Medications Ordered in ED Medications  oxyCODONE-acetaminophen (PERCOCET/ROXICET) 5-325 MG per tablet 1 tablet (1 tablet Oral Given 10/03/16 1400)    oxyCODONE-acetaminophen (PERCOCET/ROXICET) 5-325 MG per tablet (not administered)  HYDROmorphone (DILAUDID) injection 1 mg (not administered)  ondansetron (ZOFRAN) injection 4 mg (not administered)  iopamidol (ISOVUE-300) 61 % injection (not administered)     Initial Impression / Assessment and Plan / ED Course  I have reviewed the triage vital signs and the nursing notes.  Pertinent labs & imaging results that were available during my care of the patient were reviewed by me and considered in my medical decision making (see chart for details).  Clinical Course     Patient with recent gastric bypass. Complaining of acute abdominal pain. Reports fevers at home. Has had associated nausea, vomiting, diarrhea. Is fairly tender to palpation on exam. Will check CT abdomen pelvis. We'll treat pain, and will reassess.  Patient signed out to [facility], NP, who will continue care.  Patient also discussed with Dr. Maryan Rued.  Final Clinical Impressions(s) / ED Diagnoses   Final diagnoses:  None    New Prescriptions New Prescriptions   No medications on file     Montine Circle, PA-C 10/03/16 [place], MD 10/04/16 1319

## 2016-10-03 NOTE — ED Triage Notes (Signed)
Per PT, Pt is coming from home with complaints of abdominal pain at the site of her tube. Pt had gastric bypass surgery and had tube replacement on Tuesday due to pain. Pt reports pain continuing and is not able to sleep. Drainage is serous at this time. No blood noted.

## 2016-10-04 MED ORDER — OXYCODONE-ACETAMINOPHEN 5-325 MG PO TABS: 1.0000 | ORAL_TABLET | Freq: Four times a day (QID) | ORAL | 0 refills | Status: DC | PRN

## 2016-10-04 MED ORDER — OXYCODONE-ACETAMINOPHEN 5-325 MG PO TABS
1.0000 | ORAL_TABLET | Freq: Once | ORAL | Status: DC
  Filled 2016-10-04: qty 1

## 2016-10-04 NOTE — ED Provider Notes (Signed)
CT Scan shows no significant change since last scan 08/2016  Will be given pain control and instructions to FU with surgeon    Junius Creamer, NP 10/04/16 0017    Junius Creamer, NP 10/04/16 [id number]    Blanchie Dessert, MD 10/04/16 1313

## 2016-10-04 NOTE — Discharge Instructions (Signed)
Her CT scan does not show any obstruction or abscess.  He been given a prescription for Percocet for pain control.  Please call your surgeon for follow-up on Monday

## 2016-10-04 NOTE — ED Notes (Signed)
Patient able to ambulate independently  
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
## 2016-12-13 IMAGING — DX DG LUMBAR SPINE COMPLETE 4+V
5 series · 5 of 5 positions shown · non-contrast
Comparison: 09/06/2015

CLINICAL DATA: Fell 3 weeks ago.  Persistent low back pain.

EXAM:
LUMBAR SPINE - COMPLETE 4+ VIEW

[l-spine ap]
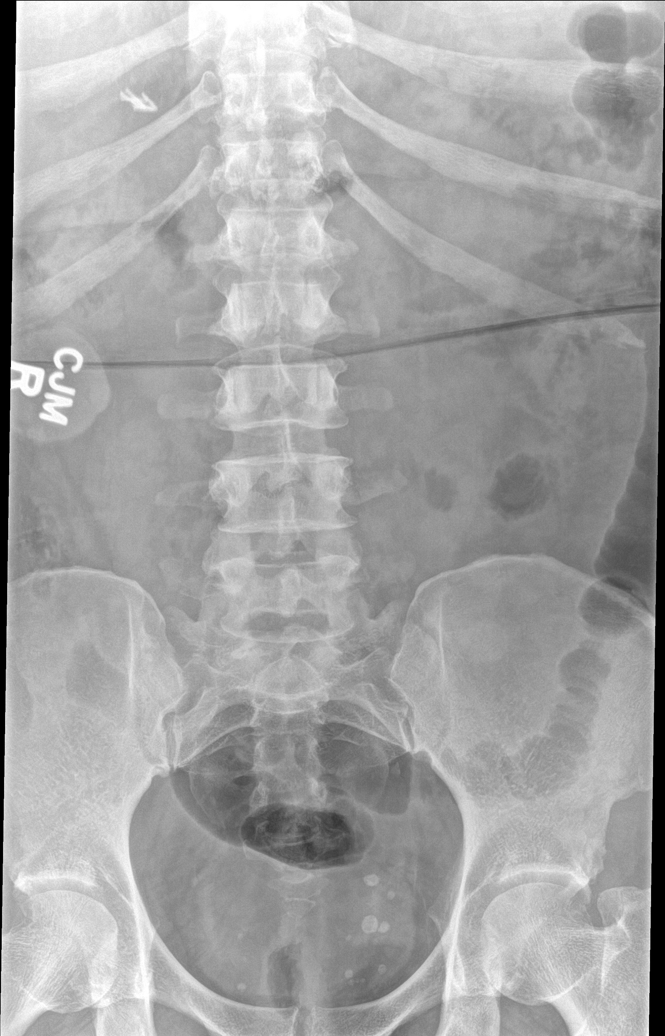

[l-spine obl (1 of 2)]
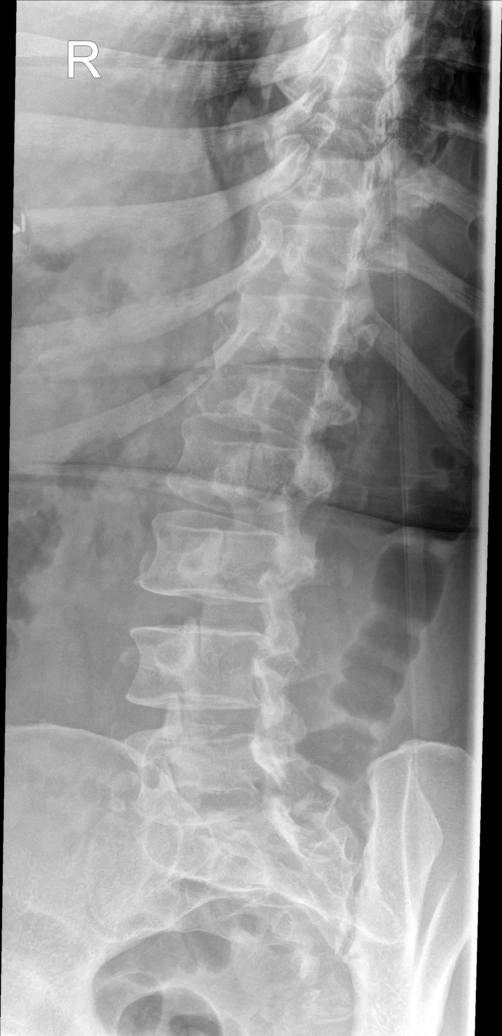

[l-spine obl (2 of 2)]
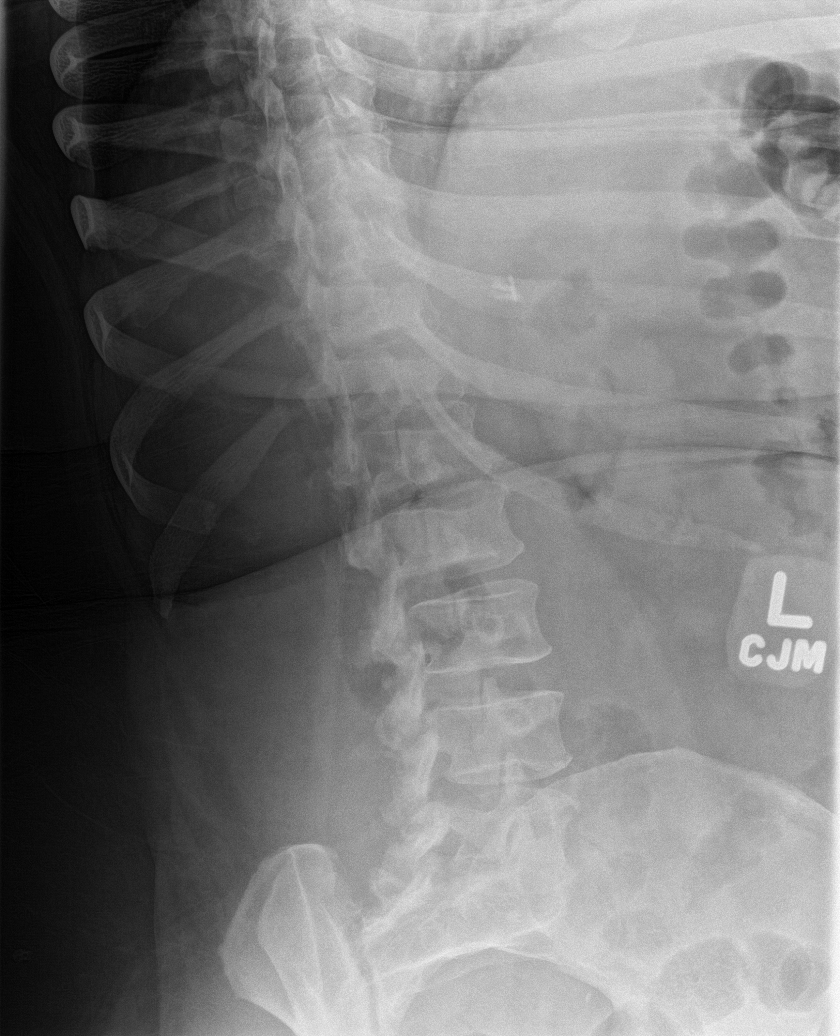

[l-spine lat]
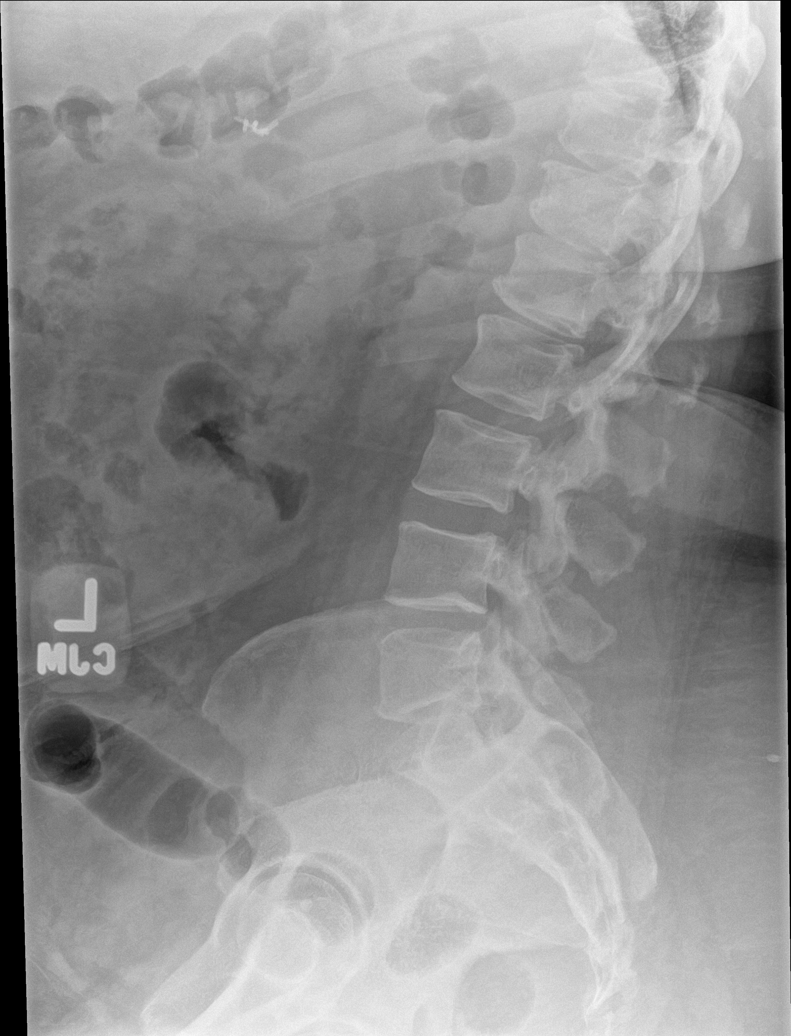

[l-spine spot]
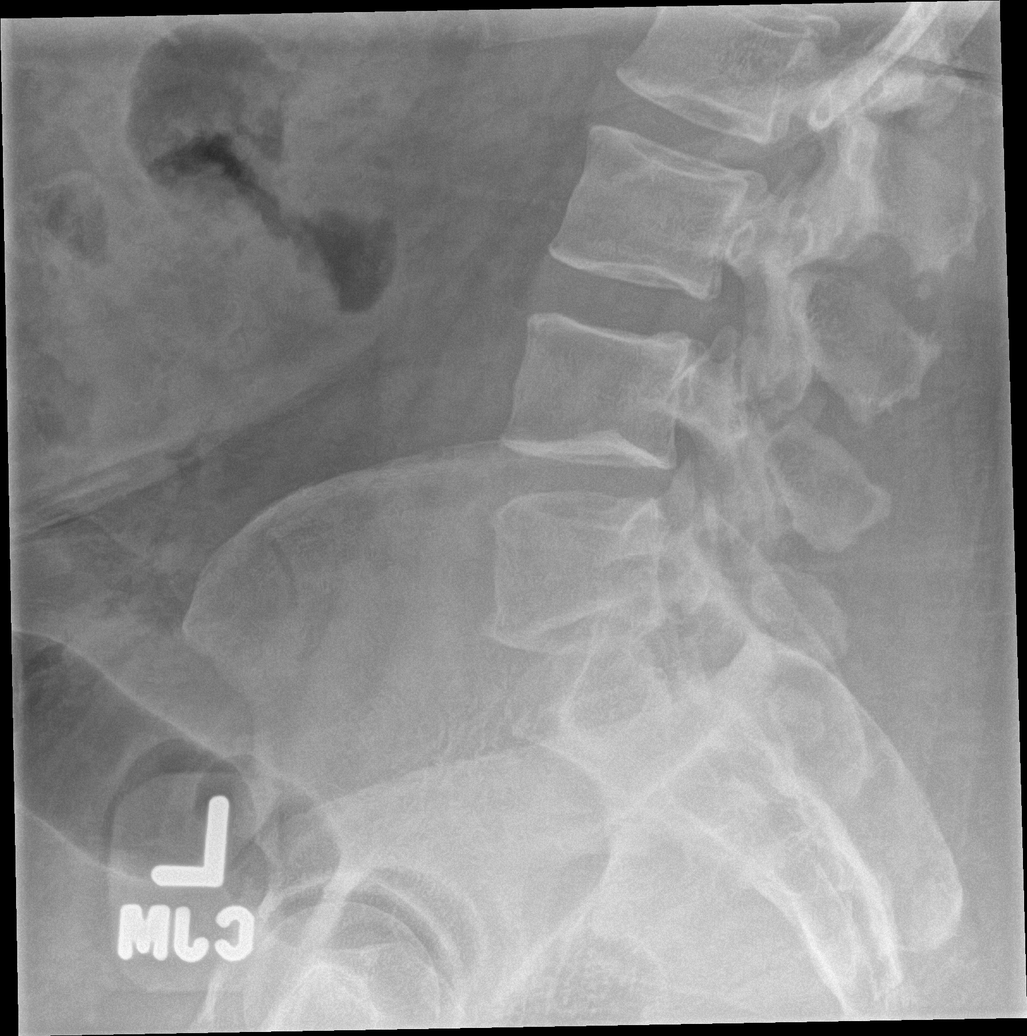

[5 of 5 positions shown; findings below may reference images not displayed]

FINDINGS: There is mild chronic anterior wedging of T12 and L1. The lumbar
vertebrae are otherwise normal in height. There is no acute
fracture. There is no bone lesion or bony destruction. No
significant degenerative changes are evident.
IMPRESSION: Negative.

## 2016-12-13 IMAGING — DX DG HIP (WITH OR WITHOUT PELVIS) 2-3V*L*
3 series · 3 of 3 positions shown · non-contrast
Comparison: None.

CLINICAL DATA: Fell 3 weeks ago.  Persistent left hip pain.

EXAM:
DG HIP (WITH OR WITHOUT PELVIS) 2-3V LEFT

[pelvis ap]
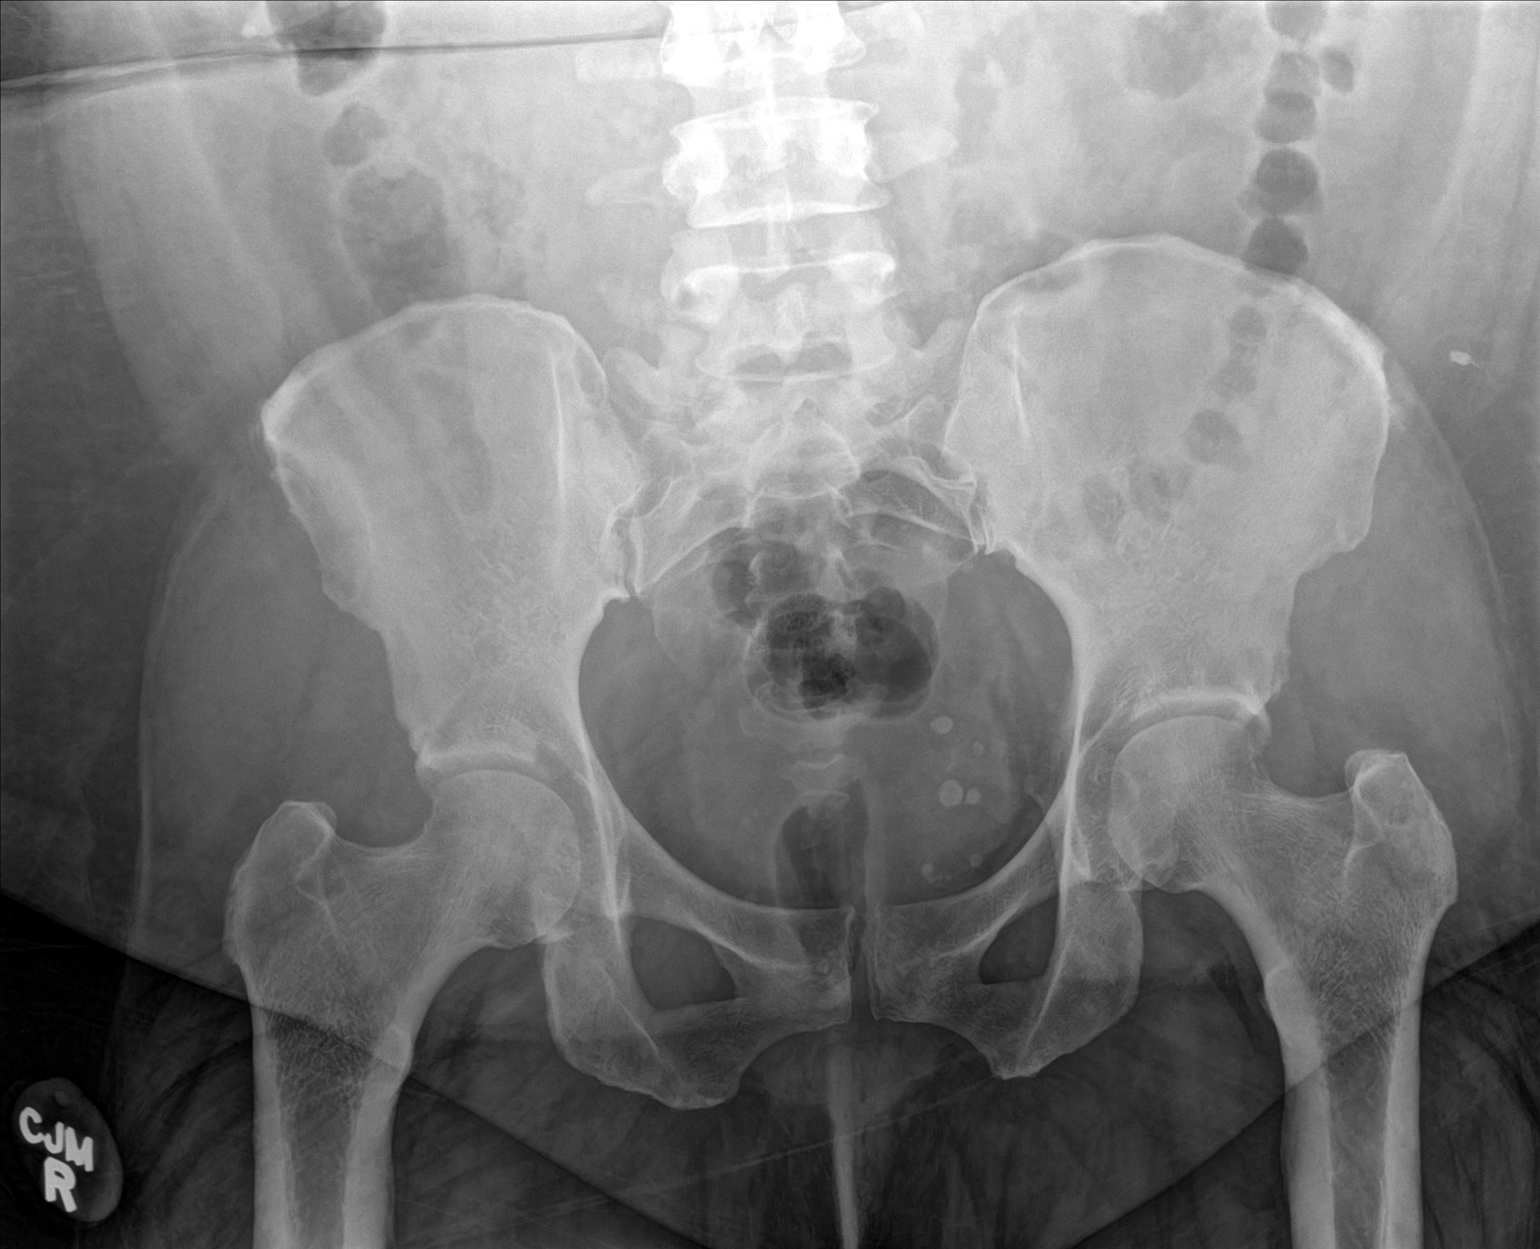

[hip ap]
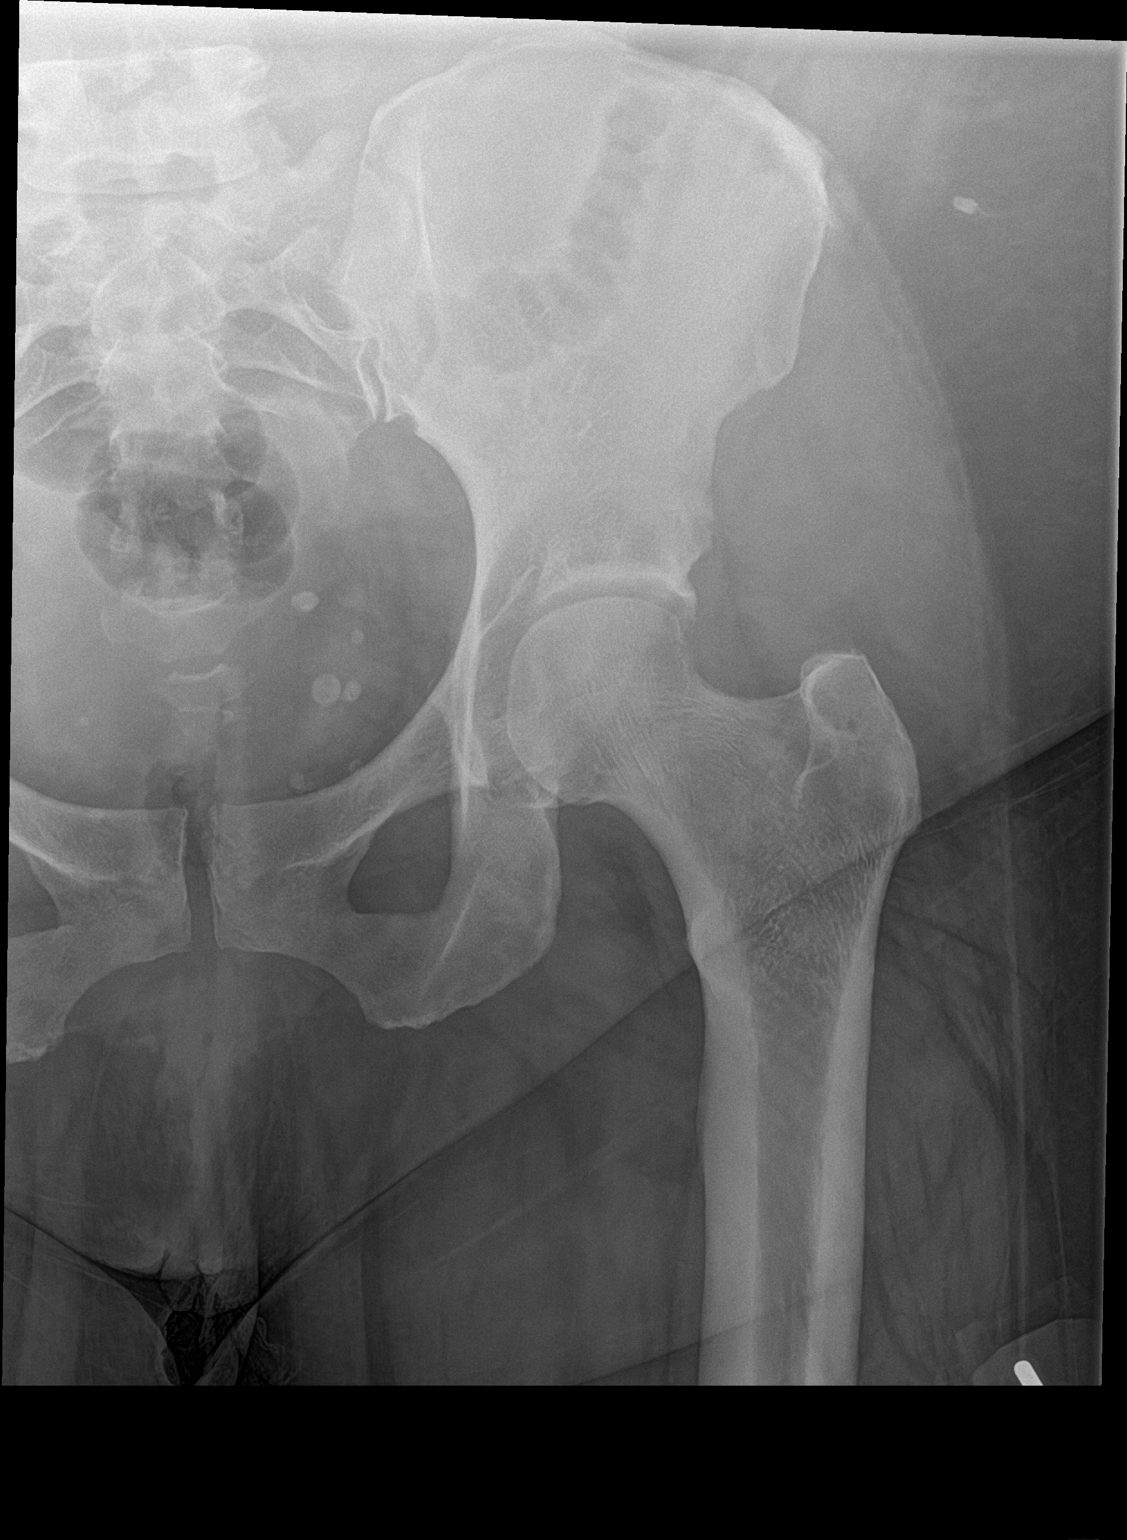

[hip lat]
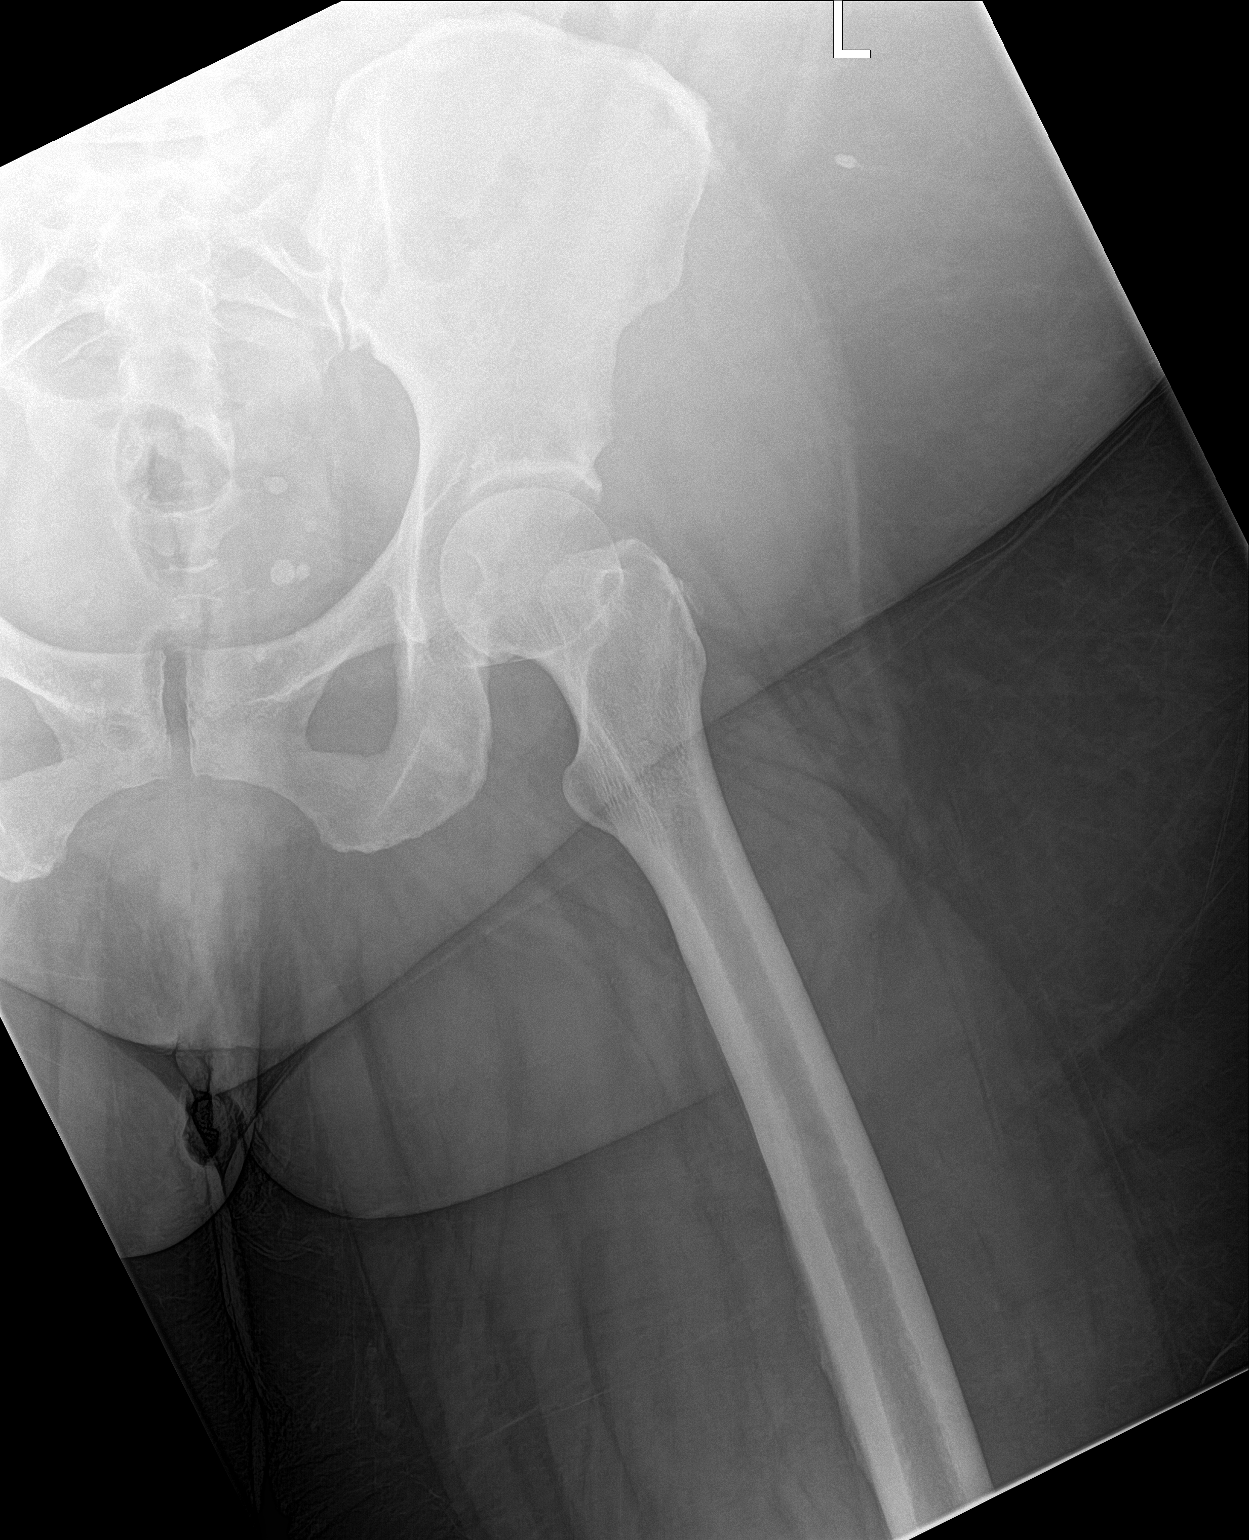

[3 of 3 positions shown; findings below may reference images not displayed]

FINDINGS: There is no evidence of hip fracture or dislocation. There is no
evidence of arthropathy or other focal bone abnormality.
IMPRESSION: Negative.

## 2016-12-25 ENCOUNTER — Other Ambulatory Visit: Payer: Self-pay | Admitting: Physician Assistant

## 2016-12-25 ENCOUNTER — Ambulatory Visit
Admission: RE | Admit: 2016-12-25 | Discharge: 2016-12-25 | Disposition: A | Discharge status: Home / Self Care | Payer: Medicaid Other | Source: Ambulatory Visit | Attending: Physician Assistant | Admitting: Physician Assistant

## 2016-12-25 DIAGNOSIS — M25551 Pain in right hip: Secondary | ICD-10-CM

## 2016-12-25 DIAGNOSIS — M25552 Pain in left hip: Principal | ICD-10-CM

## 2017-02-19 ENCOUNTER — Other Ambulatory Visit: Payer: Self-pay | Admitting: Physician Assistant

## 2017-02-19 DIAGNOSIS — R42 Dizziness and giddiness: Secondary | ICD-10-CM

## 2017-03-04 ENCOUNTER — Ambulatory Visit
Admission: RE | Admit: 2017-03-04 | Discharge: 2017-03-04 | Disposition: A | Discharge status: Home / Self Care | Payer: Medicaid Other | Source: Ambulatory Visit | Attending: Physician Assistant | Admitting: Physician Assistant

## 2017-03-04 DIAGNOSIS — R42 Dizziness and giddiness: Secondary | ICD-10-CM

## 2017-04-30 ENCOUNTER — Ambulatory Visit (INDEPENDENT_AMBULATORY_CARE_PROVIDER_SITE_OTHER): Payer: Medicaid Other | Admitting: Neurology

## 2017-04-30 ENCOUNTER — Encounter: Payer: Self-pay | Admitting: Neurology

## 2017-04-30 ENCOUNTER — Encounter (INDEPENDENT_AMBULATORY_CARE_PROVIDER_SITE_OTHER): Payer: Self-pay

## 2017-04-30 VITALS — BP 165/85 | HR 69 | Ht 59.0 in | Wt 197.0 lb

## 2017-04-30 DIAGNOSIS — M5441 Lumbago with sciatica, right side: Secondary | ICD-10-CM

## 2017-04-30 DIAGNOSIS — R269 Unspecified abnormalities of gait and mobility: Secondary | ICD-10-CM | POA: Diagnosis not present

## 2017-04-30 DIAGNOSIS — R42 Dizziness and giddiness: Secondary | ICD-10-CM | POA: Insufficient documentation

## 2017-04-30 DIAGNOSIS — I679 Cerebrovascular disease, unspecified: Secondary | ICD-10-CM | POA: Diagnosis not present

## 2017-04-30 DIAGNOSIS — G8929 Other chronic pain: Secondary | ICD-10-CM
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
## 2017-04-30 NOTE — Patient Instructions (Signed)

## 2017-09-04 IMAGING — CT CT ABD-PELV W/ CM
2 of 4 series · 8 of 46 positions shown, 9 images · IV contrast (Iodine)
Comparison: 09/20/2016 CT abdomen and pelvis

CLINICAL DATA: 55 y/o F; abdominal pain radiating to the left flank
for 2 weeks. Nausea and diarrhea for the past 2 days.

EXAM:
CT ABDOMEN AND PELVIS WITH CONTRAST
TECHNIQUE: Multidetector CT imaging of the abdomen and pelvis was performed
using the standard protocol following bolus administration of
intravenous contrast.
CONTRAST:  1 1P5EIZ-Y77 IOPAMIDOL (1P5EIZ-Y77) INJECTION 61%

[Series 201: routine, idose (2) · axial · 0.95mm/px · z∈[-487,-147]mm · 5 of 92 slices shown, 6 images]
[im 12/92  soft-tissue]
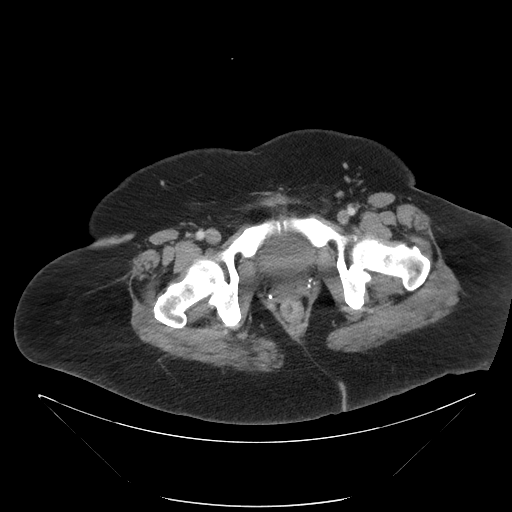
[im 12/92  bone]
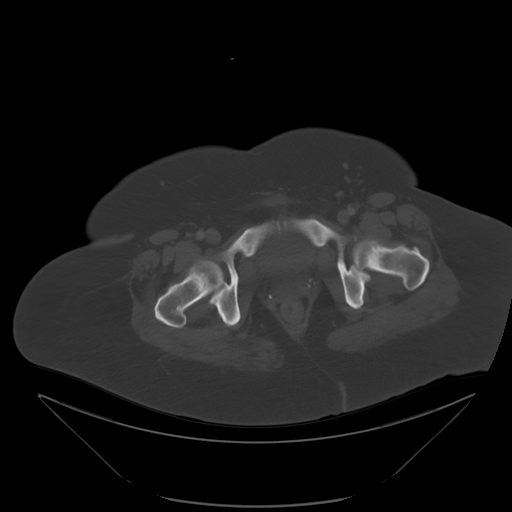
[im 28/92  soft-tissue]
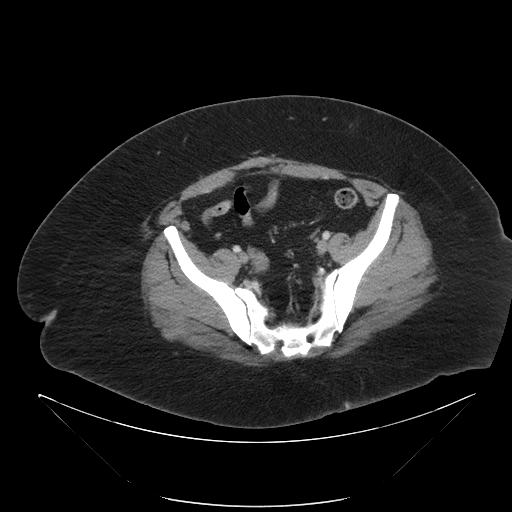
[im 48/92  soft-tissue]
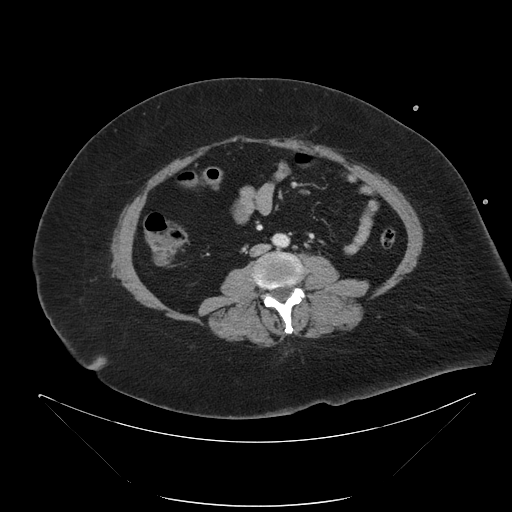
[im 64/92  soft-tissue]
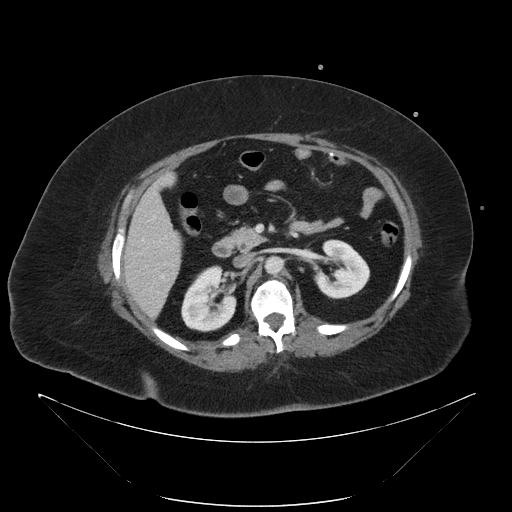
[im 80/92  soft-tissue]
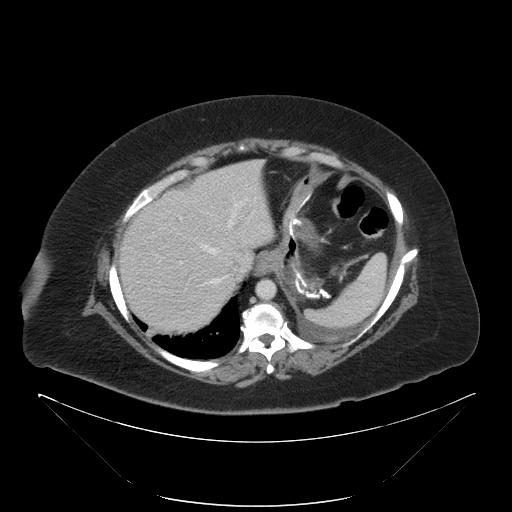

[Series 206: coronals, idose (2) · coronal · 0.45mm/px · 3 of 132 slices shown]
[im 44/132  soft-tissue]
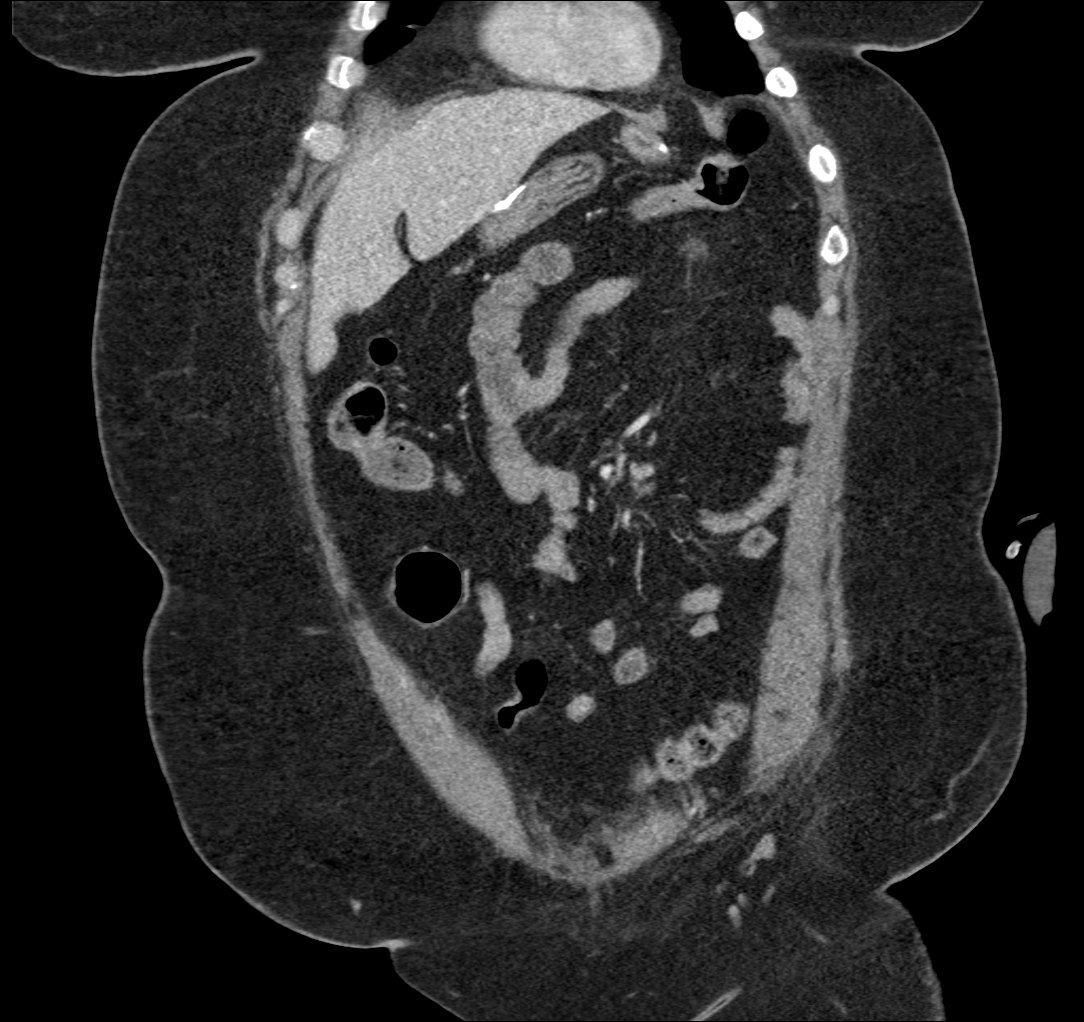
[im 59/132  soft-tissue]
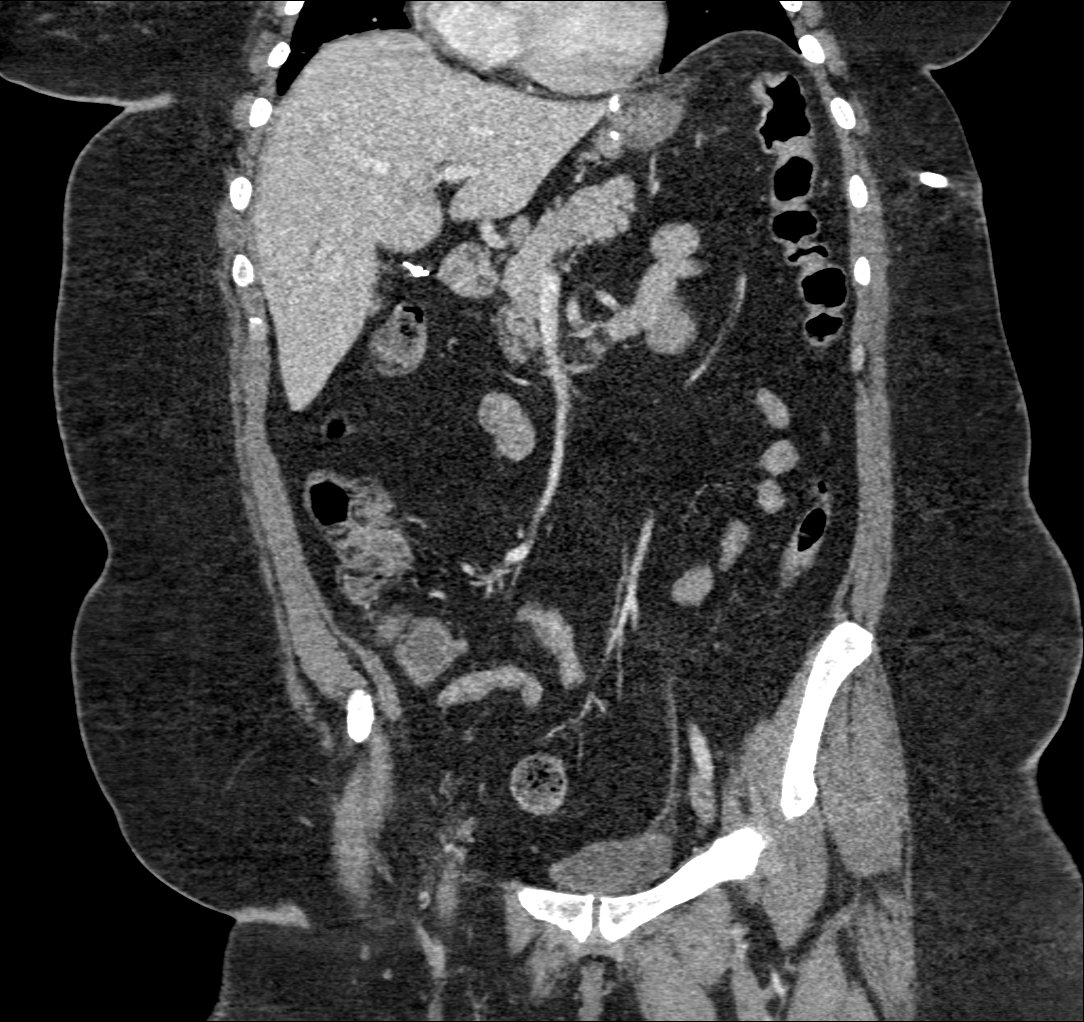
[im 73/132  soft-tissue]
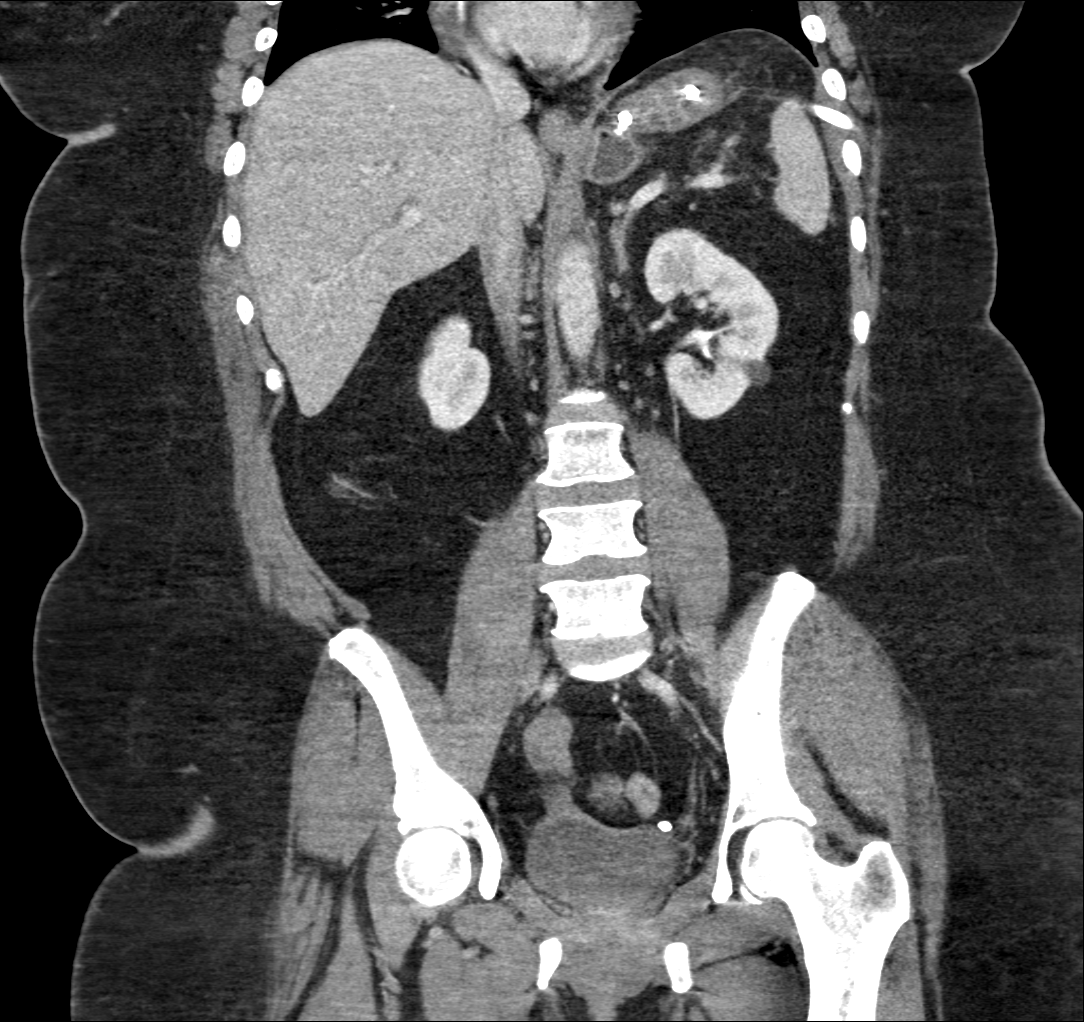

[8 of 46 positions shown; findings below may reference images not displayed]

FINDINGS: Lower chest: Pulmonary nodules in the right lung base measuring up
to 7 mm (series 205 image 1, 3, 8). Small left pleural effusion.

Hepatobiliary: No focal liver abnormality is seen. Status post
cholecystectomy. No biliary dilatation.

Pancreas: Unremarkable. No pancreatic ductal dilatation or
surrounding inflammatory changes.

Spleen: Normal in size without focal abnormality.

Adrenals/Urinary Tract: Stable bilateral renal cysts with simple
features. No hydronephrosis. Unremarkable bladder.

Stomach/Bowel: No obstructive or inflammatory changes of the small
or large bowel. Normal appendix. Status post gastric bypass without
appreciable obstruction of the biliary or enteric limbs and
unremarkable anastomosis in the left anterior upper abdomen. There
is a surgical drain within the excluded gastric cardia of the
biliary limb of the gastric bypass that is unchanged in comparison
with the prior CT. There is increased mild fat stranding along the
course of the catheter in the left upper abdomen, but there is no
free air or extraluminal fluid collection identified.

Vascular/Lymphatic: No significant vascular findings are present. No
enlarged abdominal or pelvic lymph nodes.

Reproductive: Status post hysterectomy. No adnexal masses.

Other: Small paraumbilical hernia containing fat stranding of
midline lower abdominal ventral subcutaneous fat to the abdominal
wall without interval change probably represents a site of prior
surgical intervention. Small scattered calcifications in the flank
subcutaneous fat may be related to prior injection or trauma.

Musculoskeletal: No acute or significant osseous findings.
IMPRESSION: 1. Mild increase in fat stranding surrounding the drainage catheter
in the left upper abdomen as the catheter enters the gastric cardia
which may represent inflammation or leakage. No discrete fluid
collection or pneumoperitoneum is identified.
2. New small left pleural effusion is probably reactive.
3. New pulmonary nodules in the right lung base measuring up to 7
mm, in the absence of primary malignancy these are probably
infectious/inflammatory.
4. Postsurgical changes related to gastric bypass without evidence
for obstructive.

By: Evi Jeffries M.D.

## 2017-09-04 IMAGING — CR DG CHEST 1V PORT
1 series · 1 of 1 positions shown · non-contrast
Comparison: 02/11/2015

CLINICAL DATA: PICC placement

EXAM:
PORTABLE CHEST 1 VIEW

[AP]
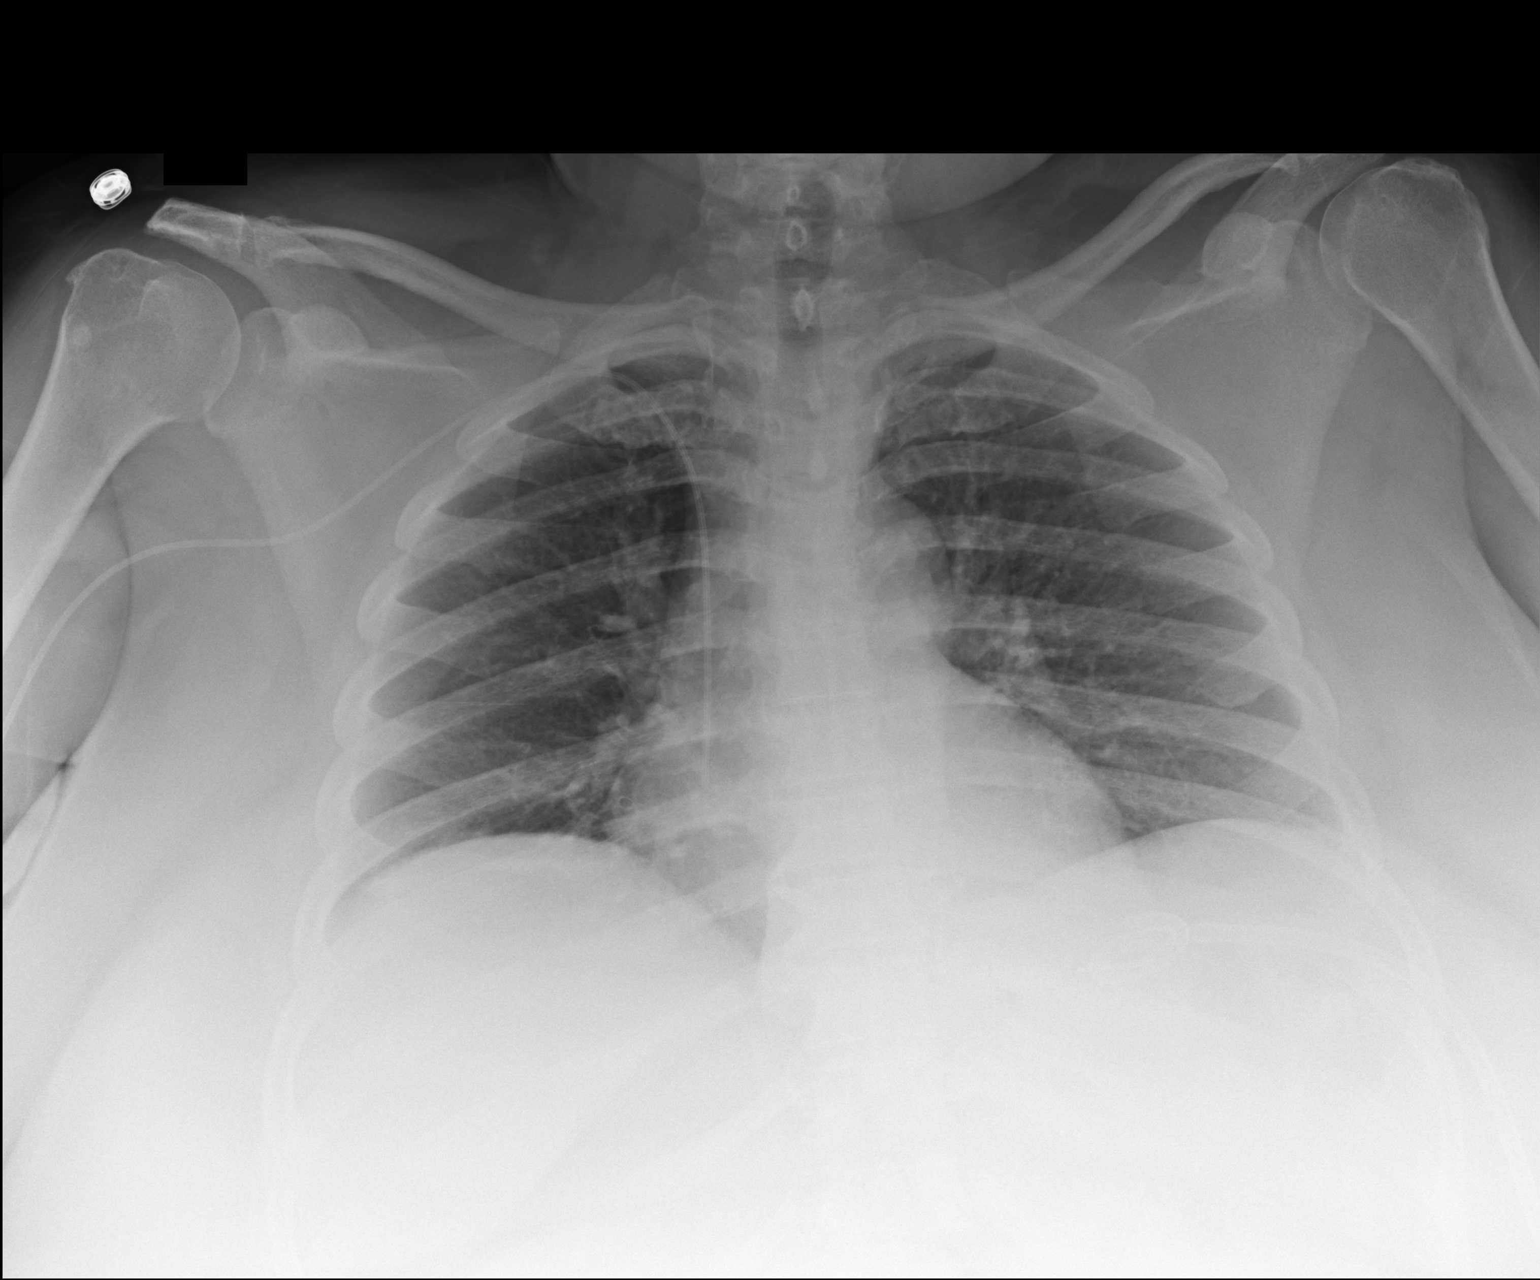

[1 of 1 positions shown; findings below may reference images not displayed]

FINDINGS: Right arm PICC tip in the lower SVC near the cavoatrial junction.

Hypoventilation with mild atelectasis in the bases. Negative for
heart failure or pneumonia.
IMPRESSION: PICC tip in the lower SVC

Hypoventilation with mild bibasilar atelectasis.

## 2017-11-26 IMAGING — DX DG HIP (WITH OR WITHOUT PELVIS) 3-4V BILAT
3 series · 4 of 4 positions shown · non-contrast
Comparison: Coronal and sagittal CT images from an abdominal and
pelvic CT scan October 03, 2016

CLINICAL DATA: Bilateral hip pain for several years with no known
injury.

EXAM:
DG HIP (WITH OR WITHOUT PELVIS) 3-4V BILAT

[dg hips bilat w or w/o pelvis 3-4 views (1 of 3)]
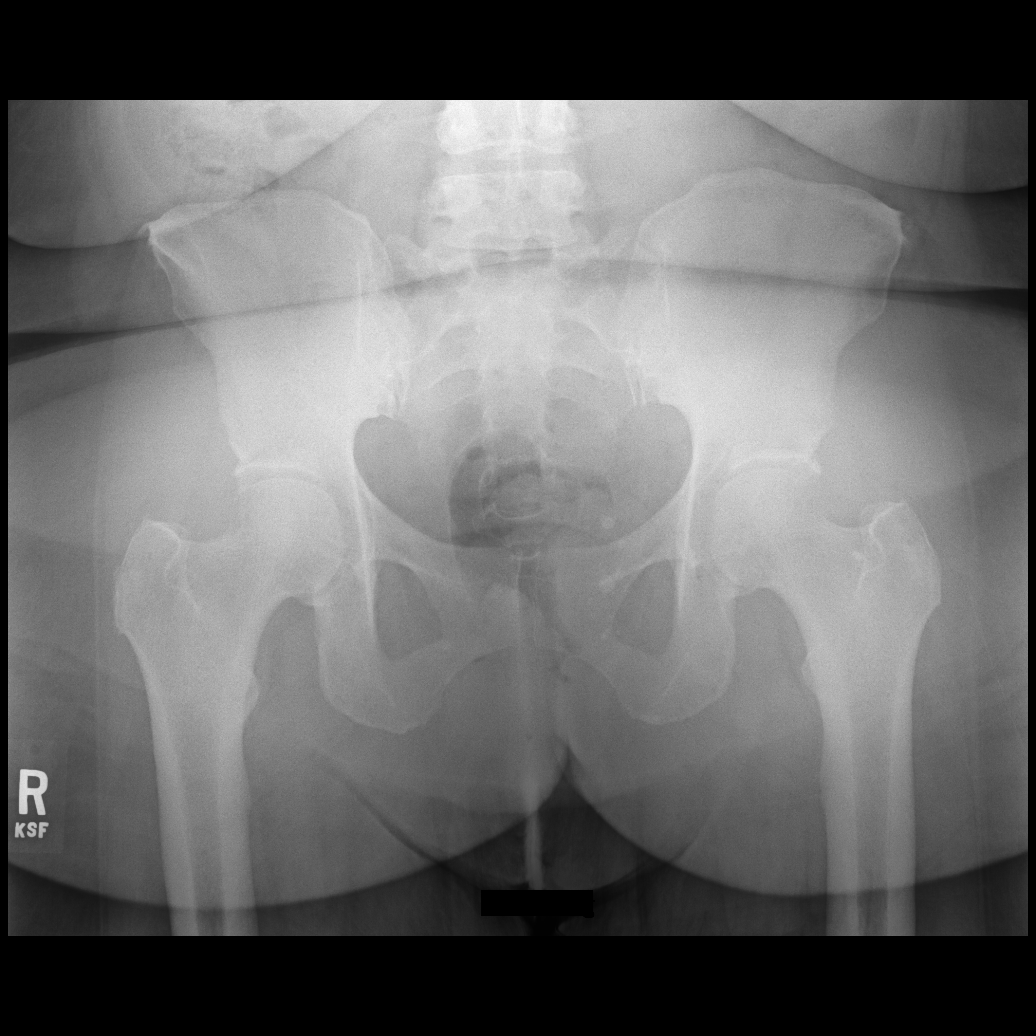

[dg hips bilat w or w/o pelvis 3-4 views (2 of 3)]
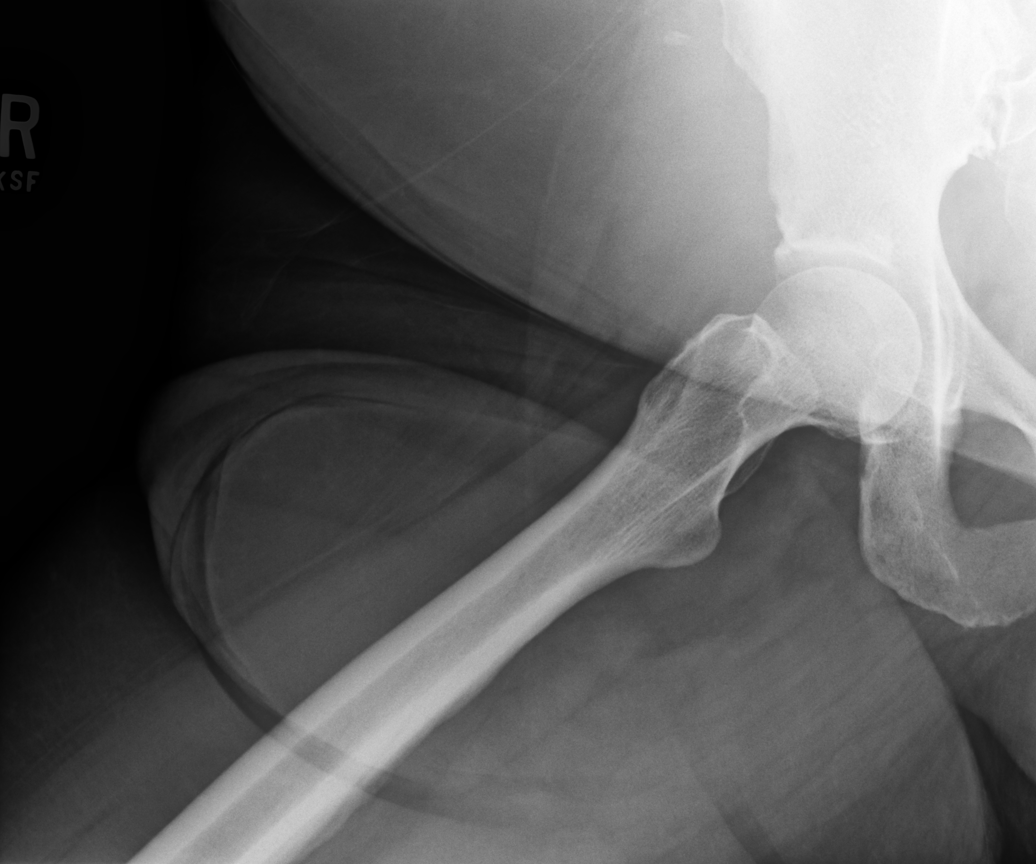

[Series 4: dg hips bilat w or w/o pelvis 3-4 views · non-contrast · left · 0.14mm/px · 2 of 2 slices shown (3 of 3)]
[im 1/2]
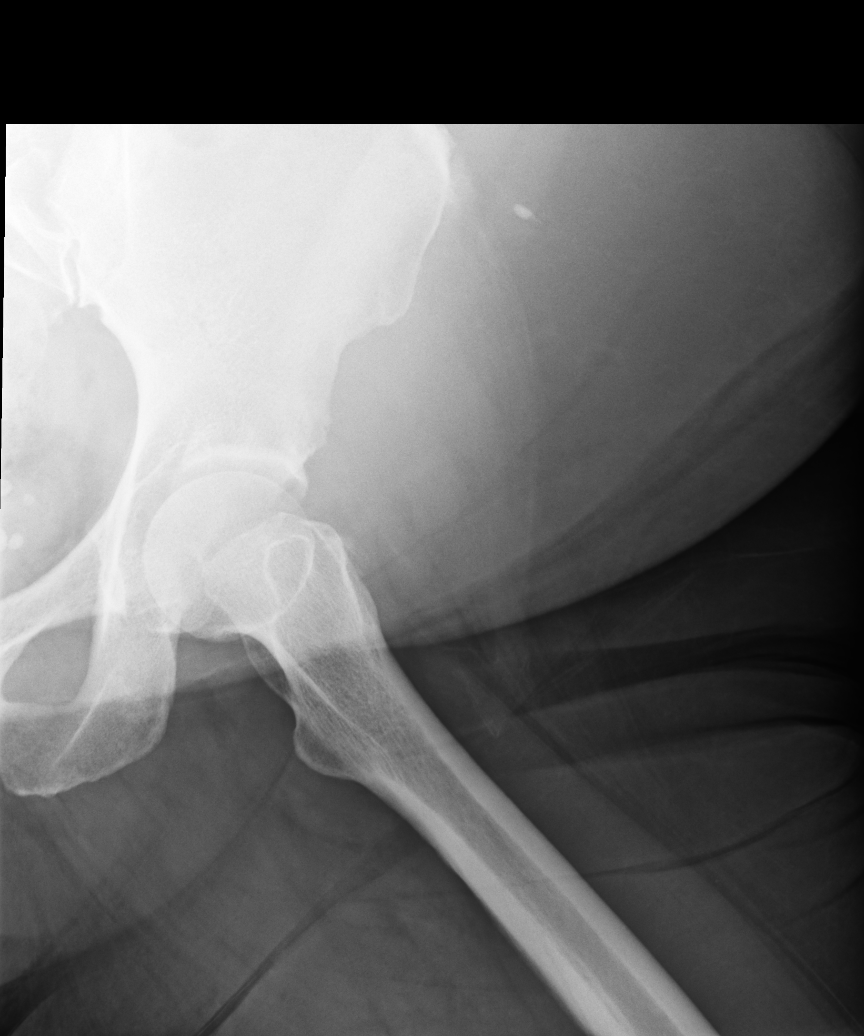
[im 2/2]
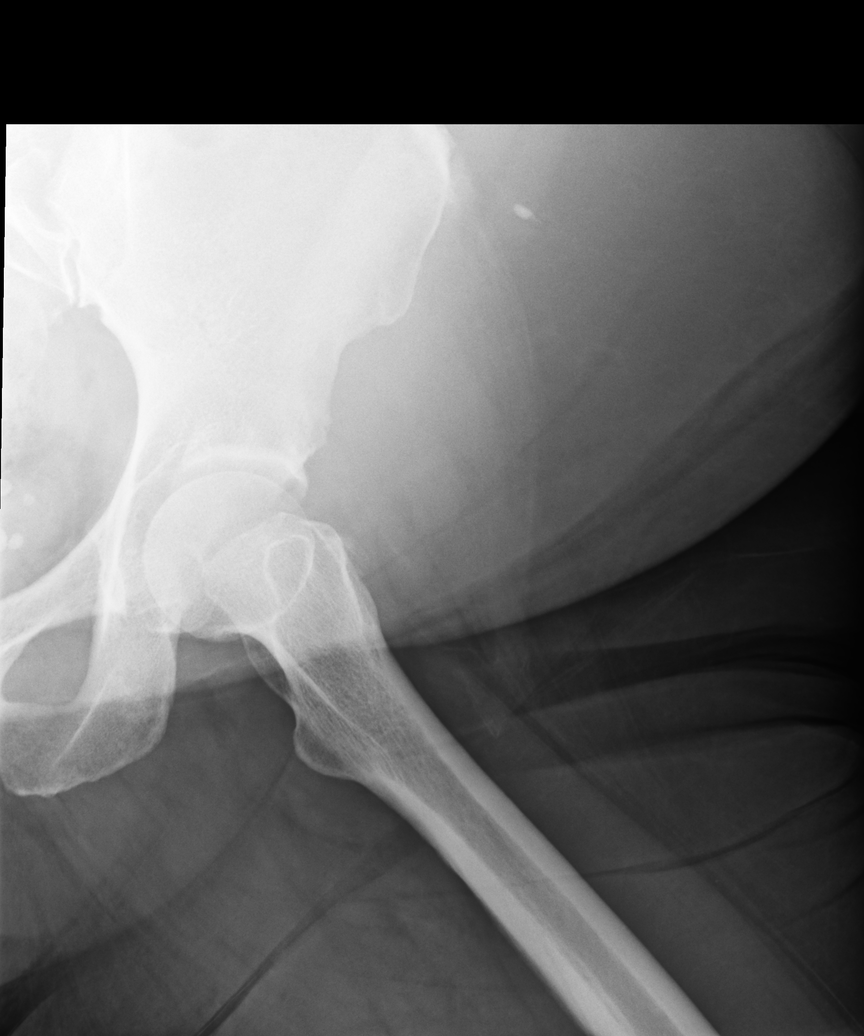

[4 of 4 positions shown; findings below may reference images not displayed]

FINDINGS: The bony pelvis is subjectively adequately mineralized. There is no
lytic nor blastic lesion. The sacrum and SI joints are grossly
normal.

AP and lateral views of both hips reveal preservation of the joint
spaces. The articular surfaces of the femoral heads and acetabuli
remains smoothly rounded. The femoral necks, intertrochanteric, and
subtrochanteric regions are normal.
IMPRESSION: There is no acute or significant chronic bony abnormality of the
pelvis or either hip.

## 2017-12-16 ENCOUNTER — Emergency Department (HOSPITAL_COMMUNITY): Payer: Medicare Other

## 2017-12-16 ENCOUNTER — Inpatient Hospital Stay (HOSPITAL_COMMUNITY)
Admission: EM | Admit: 2017-12-16 | Discharge: 2017-12-18 | DRG: 313 | Disposition: A | Discharge status: Home / Self Care | Payer: Medicare Other | Attending: Family Medicine | Admitting: Family Medicine

## 2017-12-16 ENCOUNTER — Encounter (HOSPITAL_COMMUNITY): Payer: Self-pay | Admitting: Emergency Medicine

## 2017-12-16 DIAGNOSIS — R079 Chest pain, unspecified: Secondary | ICD-10-CM | POA: Diagnosis present

## 2017-12-16 DIAGNOSIS — E876 Hypokalemia: Secondary | ICD-10-CM | POA: Diagnosis not present

## 2017-12-16 DIAGNOSIS — Z8673 Personal history of transient ischemic attack (TIA), and cerebral infarction without residual deficits: Secondary | ICD-10-CM

## 2017-12-16 DIAGNOSIS — M797 Fibromyalgia: Secondary | ICD-10-CM | POA: Diagnosis present

## 2017-12-16 DIAGNOSIS — G4733 Obstructive sleep apnea (adult) (pediatric): Secondary | ICD-10-CM | POA: Diagnosis present

## 2017-12-16 DIAGNOSIS — Z91013 Allergy to seafood: Secondary | ICD-10-CM

## 2017-12-16 DIAGNOSIS — Z888 Allergy status to other drugs, medicaments and biological substances status: Secondary | ICD-10-CM

## 2017-12-16 DIAGNOSIS — I10 Essential (primary) hypertension: Secondary | ICD-10-CM | POA: Diagnosis present

## 2017-12-16 DIAGNOSIS — E785 Hyperlipidemia, unspecified: Secondary | ICD-10-CM | POA: Diagnosis present

## 2017-12-16 DIAGNOSIS — R55 Syncope and collapse: Secondary | ICD-10-CM | POA: Diagnosis present

## 2017-12-16 DIAGNOSIS — Z8249 Family history of ischemic heart disease and other diseases of the circulatory system: Secondary | ICD-10-CM

## 2017-12-16 DIAGNOSIS — R0789 Other chest pain: Principal | ICD-10-CM | POA: Diagnosis present

## 2017-12-16 DIAGNOSIS — Z79899 Other long term (current) drug therapy: Secondary | ICD-10-CM

## 2017-12-16 DIAGNOSIS — E7800 Pure hypercholesterolemia, unspecified: Secondary | ICD-10-CM | POA: Diagnosis present

## 2017-12-16 DIAGNOSIS — N390 Urinary tract infection, site not specified: Secondary | ICD-10-CM | POA: Diagnosis present

## 2017-12-16 DIAGNOSIS — Z9884 Bariatric surgery status: Secondary | ICD-10-CM

## 2017-12-16 DIAGNOSIS — I248 Other forms of acute ischemic heart disease: Secondary | ICD-10-CM | POA: Diagnosis present

## 2017-12-16 DIAGNOSIS — F329 Major depressive disorder, single episode, unspecified: Secondary | ICD-10-CM | POA: Diagnosis present

## 2017-12-16 LAB — CBC
HCT: 39.7 % (ref 36.0–46.0)
Hemoglobin: 13.3 g/dL (ref 12.0–15.0)
MCH: 28.8 pg (ref 26.0–34.0)
MCHC: 33.5 g/dL (ref 30.0–36.0)
MCV: 85.9 fL (ref 78.0–100.0)
Platelets: 257 10*3/uL (ref 150–400)
RBC: 4.62 MIL/uL (ref 3.87–5.11)
RDW: 14.2 % (ref 11.5–15.5)
WBC: 7.8 10*3/uL (ref 4.0–10.5)

## 2017-12-16 LAB — URINALYSIS, ROUTINE W REFLEX MICROSCOPIC
Bacteria, UA: NONE SEEN
Bilirubin Urine: NEGATIVE
Glucose, UA: NEGATIVE mg/dL
Hgb urine dipstick: NEGATIVE
Ketones, ur: 5 mg/dL — AB
Nitrite: POSITIVE — AB
Protein, ur: NEGATIVE mg/dL
Specific Gravity, Urine: 1.023 (ref 1.005–1.030)
pH: 5 (ref 5.0–8.0)

## 2017-12-16 LAB — COMPREHENSIVE METABOLIC PANEL
ALT: 19 U/L (ref 14–54)
AST: 22 U/L (ref 15–41)
Albumin: 3.9 g/dL (ref 3.5–5.0)
Alkaline Phosphatase: 88 U/L (ref 38–126)
Anion gap: 10 (ref 5–15)
BUN: 12 mg/dL (ref 6–20)
CO2: 27 mmol/L (ref 22–32)
Calcium: 9.1 mg/dL (ref 8.9–10.3)
Chloride: 102 mmol/L (ref 101–111)
Creatinine, Ser: 0.82 mg/dL (ref 0.44–1.00)
GFR calc Af Amer: >60 mL/min (ref >60)
GFR calc non Af Amer: >60 mL/min (ref >60)
Glucose, Bld: 114 mg/dL — ABNORMAL HIGH (ref 65–99)
Potassium: 2.5 mmol/L — CL (ref 3.5–5.1)
Sodium: 139 mmol/L (ref 135–145)
Total Bilirubin: 0.5 mg/dL (ref 0.3–1.2)
Total Protein: 7.1 g/dL (ref 6.5–8.1)

## 2017-12-16 LAB — I-STAT TROPONIN, ED: Troponin i, poc: 0 ng/mL (ref 0.00–0.08)

## 2017-12-16 LAB — LIPASE, BLOOD: Lipase: 27 U/L (ref 11–51)

## 2017-12-16 MED ORDER — ONDANSETRON 4 MG PO TBDP
4.0000 mg | ORAL_TABLET | Freq: Once | ORAL | Status: AC | PRN
  Administered 2017-12-16: 4 mg via ORAL
  Filled 2017-12-16: qty 1

## 2017-12-16 MED ORDER — LABETALOL HCL 5 MG/ML IV SOLN
10.0000 mg | Freq: Once | INTRAVENOUS | Status: AC
  Administered 2017-12-16: 10 mg via INTRAVENOUS
  Filled 2017-12-16: qty 4

## 2017-12-16 MED ORDER — MORPHINE SULFATE (PF) 4 MG/ML IV SOLN
4.0000 mg | Freq: Once | INTRAVENOUS | Status: AC
Start: 2017-12-16 — End: 2017-12-16
  Administered 2017-12-16: 4 mg via INTRAVENOUS
  Filled 2017-12-16: qty 1

## 2017-12-16 MED ORDER — METOPROLOL TARTRATE 5 MG/5ML IV SOLN
INTRAVENOUS | Status: AC
  Filled 2017-12-16: qty 5

## 2017-12-16 MED ORDER — IOPAMIDOL (ISOVUE-370) INJECTION 76%
INTRAVENOUS | Status: AC
  Administered 2017-12-16: 100 mL
  Filled 2017-12-16: qty 100

## 2017-12-16 MED ORDER — PANTOPRAZOLE SODIUM 40 MG IV SOLR
40.0000 mg | Freq: Once | INTRAVENOUS | Status: AC
  Administered 2017-12-16: 40 mg via INTRAVENOUS
  Filled 2017-12-16: qty 40

## 2017-12-16 MED ORDER — SODIUM CHLORIDE 0.9 % IV SOLN
1.0000 g | Freq: Once | INTRAVENOUS | Status: AC
  Administered 2017-12-16: 1 g via INTRAVENOUS
  Filled 2017-12-16: qty 10

## 2017-12-16 MED ORDER — HYDROMORPHONE HCL 1 MG/ML IJ SOLN
1.0000 mg | Freq: Once | INTRAMUSCULAR | Status: AC
  Administered 2017-12-16: 1 mg via INTRAVENOUS
  Filled 2017-12-16: qty 1

## 2017-12-16 MED ORDER — POTASSIUM CHLORIDE 10 MEQ/100ML IV SOLN
10.0000 meq | INTRAVENOUS | Status: AC
  Administered 2017-12-16 – 2017-12-17 (×3): 10 meq via INTRAVENOUS
  Filled 2017-12-16: qty 100

## 2017-12-16 MED ORDER — METOPROLOL TARTRATE 5 MG/5ML IV SOLN
5.0000 mg | Freq: Once | INTRAVENOUS | Status: AC
  Administered 2017-12-16: 5 mg via INTRAVENOUS

## 2017-12-16 NOTE — ED Notes (Signed)
Date and time results received: 12/16/17 1620 (use smartphrase ".now" to insert current time)  Test: K+ Critical Value: 2.5  Name of Provider Notified: Morey Hummingbird, RN (triage).  Orders Received? Or Actions Taken?:

## 2017-12-16 NOTE — ED Notes (Addendum)
bladder scan 143ml

## 2017-12-16 NOTE — ED Provider Notes (Signed)
Medical screening examination/treatment/procedure(s) were conducted as a shared visit with non-physician practitioner(s) and myself.  I personally evaluated the patient during the encounter.   EKG Interpretation  Date/Time:  Wednesday December 16 2017 20:41:43 EDT Ventricular Rate:  91 PR Interval:    QRS Duration: 97 QT Interval:  436 QTC Calculation: 537 R Axis:   -10 Text Interpretation:  Sinus rhythm Borderline T wave abnormalities Prolonged QT interval slight t wave flattening compared to previous Confirmed by Charlesetta Shanks [phone]) on 12/16/2017 8:58:02 PM     Been having ongoing problems with abdominal pain for several days.  She had been started on antibiotics for UTI by her PCP yesterday.  She does have history of gastric bypass surgery.  Patient was initially seen by myself on urgent\emergent basis.  She had just been placed in a room and heart rate suddenly went from 80s in sinus rhythm to 160s with hypertension.  She complained of severe pain in her abdomen as well as her chest.  She reported she felt short of breath and had difficult breathing.  Patient was alert and appropriate.  Her mental status was clear.  Heart rate was 160, regular and narrow complex.  She was tachypneic but no respiratory distress.  At that time, crash cart and nursing staff assembled for resuscitation.  Patient however responded positively to a fluid bolus and pain control.  Heart rate resolved to sinus tachycardia low 100s.  No further intervention of shock or airway support other than supplemental oxygen were required.  Patient was given Lopressor 5 mg IV and Dilaudid for pain.  This improved the patient's heart rate but she did remain hypertensive.  We proceeded with diagnostic evaluation including dissection study which was negative.  Additional blood pressure control with labetalol 10 mg IV.  Repeat EKGs and troponin did not show acute ischemic changes.  After completing diagnostic workup plan was for admission  and observation.     Charlesetta Shanks, MD 12/20/17 [phone]

## 2017-12-16 NOTE — ED Notes (Signed)
Pt tried to urinate but was unsuccessful.  Patient has specimen cup with her in lobby.

## 2017-12-16 NOTE — ED Triage Notes (Signed)
Pt c/o abd pains, back pains, dizziness. Reports that she is on antibiotics for UTI.

## 2017-12-16 NOTE — ED Provider Notes (Addendum)
[facility] DEPT Provider Note   CSN: [id number] Arrival date & time: 12/16/17  1351     History   Chief Complaint Chief Complaint  Patient presents with  . Abdominal Pain  . Back Pain  . Dizziness    HPI Jodi Bowman is a 57 y.o. female with a h/o of HTN, CVA, hypercholesteremia, and gastric bypass who presents to the emergency department with a chief complaint of dyspnea with associated chset pain dizziness, lightheadedness, nausea, palpitations, and abdominal pain.  The patient reports that she was seen yesterday by her PCP for dysuria, urinary urgency and frequency, nausea and was started on Bactrim for a UTI.  The patient's husband states that the patient was feeling at her baseline this morning and went to work for the first time in the last 2 months.  The patient states that this afternoon that she had an episode of dizziness, lightheadedness, palpitations, feeling hot all over, nausea, and feeling as if she couldn't breathe that lasted for several hours before resolving spontaneously.   She had a second episode after she was roomed in the ED along with crushing, central, non-radiating chest pain. HR suddenly went from the 80s to the 160s and her BP was elevated to 190/100. She was treated with 1 mg of Dilaudid and 5 mg lopressor and her heart rate slow returned to the 80-90s, but the chest pain has remained constant.  No known aggravating or alleviating factors.  She denies fever, chills, emesis, syncope, diarrhea, weakness, or numbness.  The history is provided by the patient. No language interpreter was used.    Past Medical History:  Diagnosis Date  . Arthritis   . Calculus of gallbladder without mention of cholecystitis or obstruction   . Dizziness   . Fibromyalgia   . Goiter   . Hypercholesteremia   . Hypertension   . Lower back pain   . Migraine   . Nontoxic uninodular goiter    sees dr vollmer at [facility]  . Obesity   .  Sleep apnea    STOPBANG=5  . Stroke [facility]) 2000    Patient Active Problem List   Diagnosis Date Noted  . Vertigo 04/30/2017  . Small vessel disease, cerebrovascular 04/30/2017  . Chronic low back pain 08/01/2015  . Right hip pain 08/01/2015  . Abnormality of gait 08/01/2015  . Left leg weakness   . Low back pain with radiation   . Syncope 07/30/2014  . Atypical chest pain 03/12/2014  . Lap chole [facility] April 2013 01/29/2012  . Gallstones 12/04/2011  . Thyroid nodule-non neoplastic goiter by needle aspiration 12/04/2011  . GLUCOSE INTOLERANCE 03/12/2010  . DYSLIPIDEMIA 03/12/2010  . Chronic migraine 03/12/2010  . Carotid stenosis 03/12/2010  . CEREBROVASCULAR ACCIDENT 03/12/2010  . LIPOMA 01/29/2010  . HEADACHE 01/29/2010  . ANKLE INJURY, RIGHT 04/12/2009  . PHARYNGITIS 03/21/2009  . Backache 03/01/2009  . ALLERGIC RHINITIS 03/17/2007  . LOW BACK PAIN 03/17/2007  . Essential hypertension 01/01/2007  . ANXIETY STATE NOS 09/11/2005    Past Surgical History:  Procedure Laterality Date  . ABDOMINAL HYSTERECTOMY    . CESAREAN SECTION  yrs ago   done x 2  . CHOLECYSTECTOMY  01/05/2012   Procedure: LAPAROSCOPIC CHOLECYSTECTOMY WITH INTRAOPERATIVE CHOLANGIOGRAM;  Surgeon: Pedro Earls, MD;  Location: [facility] ORS;  Service: General;  Laterality: N/A;  . COLONOSCOPY  10/08/2012   Procedure: COLONOSCOPY;  Surgeon: Beryle Beams, MD;  Location: [facility] ENDOSCOPY;  Service: Endoscopy;  Laterality: N/A;  .  KNEE ARTHROSCOPY  one 1995 and 1 in 1997   both knees done  . surgery for endometriosis  yrs ago  . thryoid biopsy  December 01, 2011    at [facility]    OB History    No data available       Home Medications    Prior to Admission medications   Medication Sig Start Date End Date Taking? Authorizing Provider  DULoxetine (CYMBALTA) 60 MG capsule Take 60 mg by mouth daily. 03/16/17  Yes [provider]  EMBEDA 30-1.2 MG CPCR TAKE 1 CAPSULE BY MOUTH EVERY 12 HOURS 11/19/17  Yes [provider]  fluticasone (FLONASE) 50 MCG/ACT nasal spray SPRAY 2 SPRAYS INTO EACH NOSTRIL EVERY DAY PRN ALLERGIES 11/08/17  Yes [provider]  folic acid (FOLVITE) 1 MG tablet Take 1 mg by mouth daily. 10/14/17  Yes [provider]  loratadine (CLARITIN) 10 MG tablet Take 10 mg by mouth daily.    Yes [provider]  LYRICA 25 MG capsule TAKE 1 CAPSULE BY MOUTH THREE TIMES A DAY (INSURANCE DENIED PRIOR AUTH) 11/11/17  Yes [provider]  metoprolol succinate (TOPROL-XL) 50 MG 24 hr tablet TAKE 1 TABLET ER 24HR DAILY ORAL 10/04/17  Yes [provider]  pantoprazole (PROTONIX) 40 MG tablet Take 40 mg by mouth 2 (two) times daily.    Yes [provider]  potassium chloride SA (K-DUR,KLOR-CON) 20 MEQ tablet Take 1 tablet (20 mEq total) by mouth 2 (two) times daily. 19/50/93  Yes Delora Fuel, MD  acetaminophen (TYLENOL) 500 MG tablet Take 1,000 mg by mouth every 6 (six) hours as needed for moderate pain or headache.    [provider]  Cholecalciferol 50000 units capsule Take 50,000 Units by mouth every Friday.    [provider]  metoprolol succinate (TOPROL-XL) 100 MG 24 hr tablet Take 1 tablet (100 mg total) by mouth daily. Take with or immediately following a meal. Patient not taking: Reported on 12/16/2017 03/13/14   Charolette Forward, MD  nitroGLYCERIN (NITROSTAT) 0.4 MG SL tablet Place 1 tablet (0.4 mg total) under the tongue every 5 (five) minutes x 3 doses as needed for chest pain. Patient taking differently: Place 0.4 mg under the tongue every 5 (five) minutes as needed for chest pain. Maximum 3 doses 03/13/14   Charolette Forward, MD  metoprolol (LOPRESSOR) 50 MG tablet Take 50 mg by mouth 2 (two) times daily.    12/04/11  [provider]  simvastatin (ZOCOR) 20 MG tablet Take 20 mg by mouth at bedtime.    12/04/11  [provider]    Family History Family History  Problem Relation Age of Onset  . Diabetes  Mother   . Hypertension Mother   . Transient ischemic attack Mother   . Seizures Mother   . Dementia Mother   . Other Father        MVA  . Cancer Brother        colon and lung  . Cancer Maternal Grandmother        colon    Social History Social History   Tobacco Use  . Smoking status: Never Smoker  . Smokeless tobacco: Never Used  Substance Use Topics  . Alcohol use: Yes    Alcohol/week: 0.0 oz    Comment: Occasionally  . Drug use: No     Allergies   Shrimp [shellfish allergy] and Naproxen   Review of Systems Review of Systems  Constitutional: Negative for activity  change, chills and fever.  HENT: Negative for congestion.   Eyes: Negative for visual disturbance.  Respiratory: Negative for shortness of breath.   Cardiovascular: Positive for chest pain and palpitations.  Gastrointestinal: Positive for abdominal pain and nausea. Negative for blood in stool, constipation and vomiting.  Genitourinary: Positive for dysuria. Negative for vaginal pain.  Musculoskeletal: Negative for back pain and neck pain.  Skin: Negative for rash.  Allergic/Immunologic: Negative for immunocompromised state.  Neurological: Positive for dizziness and light-headedness. Negative for syncope, weakness, numbness and headaches.  Psychiatric/Behavioral: Negative for confusion.   Physical Exam Updated Vital Signs BP (!) 173/89   Pulse 90   Temp (!) 96.5 F (35.8 C) (Rectal)   Resp 16   Ht 4\' 11"  (1.499 m)   Wt 77.1 kg (170 lb)   SpO2 100%   BMI 34.34 kg/m   Physical Exam  Constitutional: She is oriented to person, place, and time. No distress.  Appears distressed   HENT:  Head: Normocephalic.  Eyes: Conjunctivae are normal.  Neck: Normal range of motion. Neck supple.  Cardiovascular: Regular rhythm, normal heart sounds and intact distal pulses. Tachycardia present. Exam reveals no gallop and no friction rub.  No murmur heard. Radial and DP pulses are 2+ and symmetric.    Pulmonary/Chest: Effort normal. No stridor. No respiratory distress. She has no wheezes. She has no rales. She exhibits no tenderness.  Abdominal: Soft. Bowel sounds are normal. She exhibits no distension and no mass. There is tenderness. There is no rebound and no guarding. No hernia.  Tender to palpation to the bilateral lower quadrants Abdomen is soft, nondistended.  No CVA tenderness bilaterally.  Musculoskeletal: Normal range of motion. She exhibits no edema, tenderness or deformity.  Neurological: She is alert and oriented to person, place, and time.  Skin: Skin is warm. Capillary refill takes less than 2 seconds. No rash noted.  Psychiatric: Her behavior is normal.  Nursing note and vitals reviewed.  ED Treatments / Results  Labs (all labs ordered are listed, but only abnormal results are displayed) Labs Reviewed  COMPREHENSIVE METABOLIC PANEL - Abnormal; Notable for the following components:      Result Value   Potassium 2.5 (*)    Glucose, Bld 114 (*)    All other components within normal limits  URINALYSIS, ROUTINE W REFLEX MICROSCOPIC - Abnormal; Notable for the following components:   Ketones, ur 5 (*)    Nitrite POSITIVE (*)    Leukocytes, UA LARGE (*)    Squamous Epithelial / LPF 0-5 (*)    All other components within normal limits  URINE CULTURE  CULTURE, BLOOD (ROUTINE X 2)  CULTURE, BLOOD (ROUTINE X 2)  LIPASE, BLOOD  CBC  BRAIN NATRIURETIC PEPTIDE  I-STAT TROPONIN, ED  I-STAT TROPONIN, ED    EKG  EKG Interpretation  Date/Time:  Wednesday December 16 2017 20:41:43 EDT Ventricular Rate:  91 PR Interval:    QRS Duration: 97 QT Interval:  436 QTC Calculation: 537 R Axis:   -10 Text Interpretation:  Sinus rhythm Borderline T wave abnormalities Prolonged QT interval slight t wave flattening compared to previous Confirmed by Charlesetta Shanks [id number]) on 12/16/2017 8:58:02 PM       Radiology Dg Chest Port 1 View  Result Date: 12/16/2017 CLINICAL DATA:   Dyspnea.  Abdominal pain.  Back pain. EXAM: PORTABLE CHEST 1 VIEW COMPARISON:  Chest radiograph 10/03/2016 FINDINGS: The cardiomediastinal contours are normal. The lungs are clear. Pulmonary vasculature is normal. No consolidation, pleural effusion,  or pneumothorax. No acute osseous abnormalities are seen. Minimal proliferative change at the right acromioclavicular joint. IMPRESSION: No acute abnormality. Electronically Signed   By: Jeb Levering M.D.   On: 12/16/2017 21:32   Ct Angio Chest/abd/pel For Dissection W And/or W/wo  Result Date: 12/16/2017 CLINICAL DATA:  Abdominal pain, back pain and hypertension EXAM: CT ANGIOGRAPHY CHEST, ABDOMEN AND PELVIS TECHNIQUE: Multidetector CT imaging through the chest, abdomen and pelvis was performed using the standard protocol during bolus administration of intravenous contrast. Multiplanar reconstructed images and MIPs were obtained and reviewed to evaluate the vascular anatomy. CONTRAST:  161mL ISOVUE-370 IOPAMIDOL (ISOVUE-370) INJECTION 76% COMPARISON:  None. FINDINGS: CTA CHEST FINDINGS Cardiovascular: The heart size is normal. There is nopericardial effusion. The course and caliber of the thoracic aorta are normal. There is no aortic atherosclerotic calcification. Precontrast images show no aortic intramural hematoma. There is no blood pool, dissection or penetrating ulcer demonstrated on arterial phase postcontrast imaging. Normal variant aortic arch branching pattern with the brachiocephalic and left common carotid arteries sharing a common origin. The proximal arch vessels are widely patent. The central pulmonary arteries are normal. Mediastinum/Nodes: No mediastinal, hilar or axillary lymphadenopathy. The visualized thyroid and thoracic esophageal course are unremarkable. Lungs/Pleura: No pulmonary nodules or masses. No pleural effusion or pneumothorax. No focal airspace consolidation. No focal pleural abnormality. Musculoskeletal: No chest wall abnormality.  No acute osseous findings. Review of the MIP images confirms the above findings. CTA ABDOMEN AND PELVIS FINDINGS VASCULAR Aorta: Normal caliber aorta without aneurysm, dissection, vasculitis or hemodynamically significant stenosis. There is no aortic atherosclerosis. Celiac: No aneurysm, dissection or hemodynamically significant stenosis. Normal branching pattern. SMA: Widely patent without dissection or stenosis. Renals: Single renal arteries bilaterally. No aneurysm, dissection, stenosis or evidence of fibromuscular dysplasia. IMA: Patent without abnormality. Inflow: Minimal atherosclerotic calcification without stenosis or other abnormality. Veins: Normal course and caliber of the major veins. Assessment is otherwise limited by the arterial dominant contrast phase. Review of the MIP images confirms the above findings. NON-VASCULAR Hepatobiliary: Normal hepatic contours and density. No visible biliary dilatation. Status post cholecystectomy. Pancreas: Normal contours without ductal dilatation. No peripancreatic fluid collection. Spleen: Normal arterial phase splenic enhancement pattern. Adrenals/Urinary Tract: --Adrenal glands: Intermediate attenuation right adrenal nodule measuring 1.4 cm. --Right kidney/ureter: Right upper pole renal cyst measures 2.9 cm. --Left kidney/ureter: No hydronephrosis or perinephric stranding. No nephrolithiasis. No obstructing ureteral stones. --Urinary bladder: Unremarkable. Stomach/Bowel: --Stomach/Duodenum: Status post gastric bypass. --Small bowel: No dilatation or inflammation. --Colon: No focal abnormality. --Appendix: Normal. Lymphatic:  No abdominal or pelvic lymphadenopathy. Reproductive: Status post hysterectomy. No adnexal mass. Musculoskeletal. No bony spinal canal stenosis or focal osseous abnormality. Other: None. Review of the MIP images confirms the above findings. IMPRESSION: No acute aortic syndrome or other acute abnormality. Electronically Signed   By: Ulyses Jarred M.D.   On: 12/16/2017 22:15    Procedures .Critical Care Performed by: Joanne Gavel, PA-C Authorized by: Joanne Gavel, PA-C   Critical care provider statement:    Critical care time (minutes):  45   Critical care time was exclusive of:  Separately billable procedures and treating other patients and teaching time   Critical care was necessary to treat or prevent imminent or life-threatening deterioration of the following conditions:  Cardiac failure   Critical care was time spent personally by me on the following activities:  Evaluation of patient's response to treatment, examination of patient, development of treatment plan with patient or surrogate, ordering and review of laboratory  studies, ordering and review of radiographic studies, re-evaluation of patient's condition and review of old charts   I assumed direction of critical care for this patient from another provider in my specialty: no     (including critical care time)  Medications Ordered in ED Medications  potassium chloride 10 mEq in 100 mL IVPB (10 mEq Intravenous New Bag/Given 12/16/17 2041)  metoprolol tartrate (LOPRESSOR) 5 MG/5ML injection (not administered)  cefTRIAXone (ROCEPHIN) 1 g in sodium chloride 0.9 % 100 mL IVPB (not administered)  ondansetron (ZOFRAN-ODT) disintegrating tablet 4 mg (4 mg Oral Given 12/16/17 1441)  metoprolol tartrate (LOPRESSOR) injection 5 mg (5 mg Intravenous Given 12/16/17 2054)  HYDROmorphone (DILAUDID) injection 1 mg (1 mg Intravenous Given 12/16/17 2058)  cefTRIAXone (ROCEPHIN) 1 g in sodium chloride 0.9 % 100 mL IVPB (1 g Intravenous New Bag/Given 12/16/17 2327)  iopamidol (ISOVUE-370) 76 % injection (100 mLs  Contrast Given 12/16/17 2146)  pantoprazole (PROTONIX) injection 40 mg (40 mg Intravenous Given 12/16/17 2313)  morphine 4 MG/ML injection 4 mg (4 mg Intravenous Given 12/16/17 2323)  labetalol (NORMODYNE,TRANDATE) injection 10 mg (10 mg Intravenous Given 12/16/17 2308)      Initial Impression / Assessment and Plan / ED Course  I have reviewed the triage vital signs and the nursing notes.  Pertinent labs & imaging results that were available during my care of the patient were reviewed by me and considered in my medical decision making (see chart for details).     57 year old female with a history of CVA, HTN, hypercholesteremia, and obesity.  The patient endorses 2 episodes of dizziness, lightheadedness, nausea, dyspnea, and palpitations since this afternoon.  Second episode began in the ED with crushing, nonradiating, substernal chest pain.  Initial EKG with sinus tachycardia.  After she was roomed in the ED, heart rate went from the 80s to the 160s.  Tachypneic. Patient was seen and evaluated with Dr. Vallery Ridge, attending physician. Blood pressure was elevated to 190/100.  She was given 5 mg of Lopressor and 1 mg of Dilaudid with minimal improvement in her blood pressure to 180s over 100.   Troponin was negative.  Chest x-ray was unremarkable.  C/A/P dissection study was negative. K 2.5. 40 meq of IV potassium chloride and IV fluid bolus was initiated.  Tracks own given for UTI.   On reevaluation, the patient continues to endorse crushing substernal chest pain.  She was given 10 of labetalol and 40 of pantoprazole.  Repeat EKG with no acute abnormalities. Repeat troponin pending.   Concern for cardiac etiology of Chest Pain. HEART score of 5. Pt does not meet criteria for CP protocol and a further evaluation is recommended. Pt has been re-evaluated prior to consult and VSS, NAD, heart RRR, pain 0/10, lungs CTAB.  Dr. Alcario Drought with the hospitalist team will admit. The patient appears reasonably stabilized for admission considering the current resources, flow, and capabilities available in the ED at this time, and I doubt any other [facility] requiring further screening and/or treatment in the ED prior to admission.  Final Clinical Impressions(s) / ED Diagnoses   Final  diagnoses:  Chest pain, unspecified type  Near syncope  Hypokalemia    ED Discharge Orders    None       Joanne Gavel, PA-C 12/17/17 0015    Olanda Downie A, PA-C 12/17/17 Zebedee Iba, MD 12/20/17 [phone]

## 2017-12-17 ENCOUNTER — Other Ambulatory Visit: Payer: Self-pay

## 2017-12-17 DIAGNOSIS — R55 Syncope and collapse: Secondary | ICD-10-CM | POA: Diagnosis present

## 2017-12-17 DIAGNOSIS — M797 Fibromyalgia: Secondary | ICD-10-CM | POA: Diagnosis present

## 2017-12-17 DIAGNOSIS — Z8673 Personal history of transient ischemic attack (TIA), and cerebral infarction without residual deficits: Secondary | ICD-10-CM | POA: Diagnosis not present

## 2017-12-17 DIAGNOSIS — E785 Hyperlipidemia, unspecified: Secondary | ICD-10-CM | POA: Diagnosis present

## 2017-12-17 DIAGNOSIS — I248 Other forms of acute ischemic heart disease: Secondary | ICD-10-CM | POA: Diagnosis present

## 2017-12-17 DIAGNOSIS — R079 Chest pain, unspecified: Secondary | ICD-10-CM | POA: Diagnosis not present

## 2017-12-17 DIAGNOSIS — Z91013 Allergy to seafood: Secondary | ICD-10-CM | POA: Diagnosis not present

## 2017-12-17 DIAGNOSIS — Z9884 Bariatric surgery status: Secondary | ICD-10-CM | POA: Diagnosis not present

## 2017-12-17 DIAGNOSIS — G4733 Obstructive sleep apnea (adult) (pediatric): Secondary | ICD-10-CM | POA: Diagnosis present

## 2017-12-17 DIAGNOSIS — E7800 Pure hypercholesterolemia, unspecified: Secondary | ICD-10-CM | POA: Diagnosis present

## 2017-12-17 DIAGNOSIS — Z79899 Other long term (current) drug therapy: Secondary | ICD-10-CM | POA: Diagnosis not present

## 2017-12-17 DIAGNOSIS — N390 Urinary tract infection, site not specified: Secondary | ICD-10-CM | POA: Diagnosis present

## 2017-12-17 DIAGNOSIS — Z8249 Family history of ischemic heart disease and other diseases of the circulatory system: Secondary | ICD-10-CM | POA: Diagnosis not present

## 2017-12-17 DIAGNOSIS — E876 Hypokalemia: Secondary | ICD-10-CM | POA: Diagnosis present

## 2017-12-17 DIAGNOSIS — Z888 Allergy status to other drugs, medicaments and biological substances status: Secondary | ICD-10-CM | POA: Diagnosis not present

## 2017-12-17 DIAGNOSIS — F329 Major depressive disorder, single episode, unspecified: Secondary | ICD-10-CM | POA: Diagnosis present

## 2017-12-17 DIAGNOSIS — I10 Essential (primary) hypertension: Secondary | ICD-10-CM

## 2017-12-17 DIAGNOSIS — R0789 Other chest pain: Secondary | ICD-10-CM | POA: Diagnosis present

## 2017-12-17 LAB — TROPONIN I
Troponin I: <0.03 ng/mL (ref <0.03)
Troponin I: 0.03 ng/mL (ref <0.03)
Troponin I: 0.03 ng/mL (ref <0.03)

## 2017-12-17 LAB — BASIC METABOLIC PANEL
Anion gap: 10 (ref 5–15)
BUN: 7 mg/dL (ref 6–20)
CO2: 28 mmol/L (ref 22–32)
Calcium: 8.5 mg/dL — ABNORMAL LOW (ref 8.9–10.3)
Chloride: 106 mmol/L (ref 101–111)
Creatinine, Ser: 0.75 mg/dL (ref 0.44–1.00)
GFR calc Af Amer: >60 mL/min (ref >60)
GFR calc non Af Amer: >60 mL/min (ref >60)
Glucose, Bld: 86 mg/dL (ref 65–99)
Potassium: 2.9 mmol/L — ABNORMAL LOW (ref 3.5–5.1)
Sodium: 144 mmol/L (ref 135–145)

## 2017-12-17 LAB — I-STAT TROPONIN, ED: Troponin i, poc: 0.02 ng/mL (ref 0.00–0.08)

## 2017-12-17 LAB — BRAIN NATRIURETIC PEPTIDE: B Natriuretic Peptide: 82 pg/mL (ref 0.0–100.0)

## 2017-12-17 MED ORDER — POTASSIUM CHLORIDE CRYS ER 20 MEQ PO TBCR
40.0000 meq | EXTENDED_RELEASE_TABLET | Freq: Once | ORAL | Status: AC
  Administered 2017-12-17: 40 meq via ORAL
  Filled 2017-12-17: qty 2

## 2017-12-17 MED ORDER — PANTOPRAZOLE SODIUM 40 MG PO TBEC
40.0000 mg | DELAYED_RELEASE_TABLET | Freq: Two times a day (BID) | ORAL | Status: DC
  Administered 2017-12-17 – 2017-12-18 (×3): 40 mg via ORAL
  Filled 2017-12-17 (×5): qty 1

## 2017-12-17 MED ORDER — SODIUM CHLORIDE 0.9 % IV SOLN
1.0000 g | INTRAVENOUS | Status: DC
  Administered 2017-12-17: 1 g via INTRAVENOUS
  Filled 2017-12-17: qty 1
  Filled 2017-12-17: qty 10

## 2017-12-17 MED ORDER — ACETAMINOPHEN 500 MG PO TABS: 1000.0000 mg | ORAL_TABLET | Freq: Four times a day (QID) | ORAL | Status: DC | PRN

## 2017-12-17 MED ORDER — MORPHINE SULFATE ER 30 MG PO TBCR
30.0000 mg | EXTENDED_RELEASE_TABLET | Freq: Two times a day (BID) | ORAL | Status: DC
  Administered 2017-12-17 – 2017-12-18 (×3): 30 mg via ORAL
  Filled 2017-12-17 (×3): qty 1

## 2017-12-17 MED ORDER — FOLIC ACID 1 MG PO TABS
1.0000 mg | ORAL_TABLET | Freq: Every day | ORAL | Status: DC
  Administered 2017-12-17 – 2017-12-18 (×2): 1 mg via ORAL
  Filled 2017-12-17 (×2): qty 1

## 2017-12-17 MED ORDER — ONDANSETRON HCL 4 MG/2ML IJ SOLN: 4.0000 mg | Freq: Four times a day (QID) | INTRAMUSCULAR | Status: DC | PRN

## 2017-12-17 MED ORDER — PREGABALIN 25 MG PO CAPS
25.0000 mg | ORAL_CAPSULE | Freq: Three times a day (TID) | ORAL | Status: DC
  Administered 2017-12-17 – 2017-12-18 (×5): 25 mg via ORAL
  Filled 2017-12-17 (×5): qty 1

## 2017-12-17 MED ORDER — POTASSIUM CHLORIDE CRYS ER 20 MEQ PO TBCR
20.0000 meq | EXTENDED_RELEASE_TABLET | Freq: Two times a day (BID) | ORAL | Status: DC
  Administered 2017-12-17 (×2): 20 meq via ORAL
  Filled 2017-12-17 (×2): qty 1

## 2017-12-17 MED ORDER — LORATADINE 10 MG PO TABS
10.0000 mg | ORAL_TABLET | Freq: Every day | ORAL | Status: DC
  Administered 2017-12-17 – 2017-12-18 (×2): 10 mg via ORAL
  Filled 2017-12-17 (×2): qty 1

## 2017-12-17 MED ORDER — POTASSIUM CHLORIDE 10 MEQ/100ML IV SOLN
INTRAVENOUS | Status: AC
  Administered 2017-12-17: 10 meq via INTRAVENOUS
  Filled 2017-12-17: qty 300

## 2017-12-17 MED ORDER — MORPHINE-NALTREXONE 30-1.2 MG PO CPCR: 1.0000 | ORAL_CAPSULE | Freq: Two times a day (BID) | ORAL | Status: DC

## 2017-12-17 MED ORDER — AMLODIPINE BESYLATE 5 MG PO TABS
5.0000 mg | ORAL_TABLET | Freq: Every day | ORAL | Status: DC
  Administered 2017-12-17 – 2017-12-18 (×2): 5 mg via ORAL
  Filled 2017-12-17 (×2): qty 1

## 2017-12-17 MED ORDER — MORPHINE SULFATE (PF) 4 MG/ML IV SOLN: 2.0000 mg | INTRAVENOUS | Status: DC | PRN

## 2017-12-17 MED ORDER — ENOXAPARIN SODIUM 40 MG/0.4ML ~~LOC~~ SOLN
40.0000 mg | SUBCUTANEOUS | Status: DC
  Administered 2017-12-17 – 2017-12-18 (×2): 40 mg via SUBCUTANEOUS
  Filled 2017-12-17 (×2): qty 0.4

## 2017-12-17 MED ORDER — METOPROLOL SUCCINATE ER 50 MG PO TB24
50.0000 mg | ORAL_TABLET | Freq: Every day | ORAL | Status: DC
  Administered 2017-12-17 – 2017-12-18 (×2): 50 mg via ORAL
  Filled 2017-12-17 (×2): qty 1

## 2017-12-17 MED ORDER — ACETAMINOPHEN 325 MG PO TABS
650.0000 mg | ORAL_TABLET | ORAL | Status: DC | PRN
  Administered 2017-12-17: 650 mg via ORAL
  Filled 2017-12-17: qty 2

## 2017-12-17 MED ORDER — DULOXETINE HCL 30 MG PO CPEP
60.0000 mg | ORAL_CAPSULE | Freq: Every day | ORAL | Status: DC
  Administered 2017-12-17 – 2017-12-18 (×2): 60 mg via ORAL
  Filled 2017-12-17 (×2): qty 2

## 2017-12-17 NOTE — Consult Note (Signed)
Reason for Consult:chest pain Referring Physician: [facility] hospital[facility]  Jodi Bowman is an 57 y.o. female.  FBX:[id number] is 58 year old female with past medical history significant for hypertension, hyperlipidemia, history of obstructive sleep apnea, morbid obesity status post gastric bypass in the past, depression, history of CVA, fibromyalgia,was admitted yesterday because of weakness associated with palpitation and chest pain described as someone sitting on the chest associated with shortness of breath.. EKG done in the ED showed normal sinus rhythm with nonspecific T wave changes troponin I of onset is minimally elevated. Patient denies any history of exertional chest pain . Denies any nausea vomiting diaphoresis. Denies any history of PND orthopnea leg swelling. Denies exertional dyspnea. Denies any recent cardiac workup patient had nuclear stress test in May 2016 which showed no evidence of reversible ischemia with hyperdynamic LV systolic function.  Past Medical History:  Diagnosis Date  . Arthritis   . Calculus of gallbladder without mention of cholecystitis or obstruction   . Dizziness   . Fibromyalgia   . Goiter   . Hypercholesteremia   . Hypertension   . Lower back pain   . Migraine   . Nontoxic uninodular goiter    sees dr vollmer at [facility]  . Obesity   . Sleep apnea    STOPBANG=5  . Stroke [facility]) 2000    Past Surgical History:  Procedure Laterality Date  . ABDOMINAL HYSTERECTOMY    . CESAREAN SECTION  yrs ago   done x 2  . CHOLECYSTECTOMY  01/05/2012   Procedure: LAPAROSCOPIC CHOLECYSTECTOMY WITH INTRAOPERATIVE CHOLANGIOGRAM;  Surgeon: Pedro Earls, MD;  Location: [facility];  Service: General;  Laterality: N/A;  . COLONOSCOPY  10/08/2012   Procedure: COLONOSCOPY;  Surgeon: Beryle Beams, MD;  Location: [facility];  Service: Endoscopy;  Laterality: N/A;  . KNEE ARTHROSCOPY  one 1995 and 1 in 1997   both knees done  . surgery for endometriosis  yrs ago  . thryoid  biopsy  December 01, 2011    at [facility]    Family History  Problem Relation Age of Onset  . Diabetes Mother   . Hypertension Mother   . Transient ischemic attack Mother   . Seizures Mother   . Dementia Mother   . Other Father        MVA  . Cancer Brother        colon and lung  . Cancer Maternal Grandmother        colon    Social History:  reports that she has never smoked. She has never used smokeless tobacco. She reports that she drinks alcohol. She reports that she does not use drugs.  Allergies:  Allergies  Allergen Reactions  . Shrimp [Shellfish Allergy] Anaphylaxis  . Naproxen Other (See Comments)    Makes stomach cramp and burning badly.    Medications: I have reviewed the patient's current medications.  Results for orders placed or performed during the hospital encounter of 12/16/17 (from the past 48 hour(s))  Lipase, blood     Status: None   Collection Time: 12/16/17  3:35 PM  Result Value Ref Range   Lipase 27 11 - 51 U/L    Comment: Performed at [facility], [place] [address], [place], [place] [address]  Comprehensive metabolic panel     Status: Abnormal   Collection Time: 12/16/17  3:35 PM  Result Value Ref Range   Sodium 139 135 - 145 mmol/L   Potassium 2.5 (LL) 3.5 - 5.1 mmol/L  Comment: CRITICAL RESULT CALLED TO, READ BACK BY AND VERIFIED WITH: TIM SMITH,RN [id number] @ 1620 BY J SCOTTON    Chloride 102 101 - 111 mmol/L   CO2 27 22 - 32 mmol/L   Glucose, Bld 114 (H) 65 - 99 mg/dL   BUN 12 6 - 20 mg/dL   Creatinine, Ser 0.82 0.44 - 1.00 mg/dL   Calcium 9.1 8.9 - 10.3 mg/dL   Total Protein 7.1 6.5 - 8.1 g/dL   Albumin 3.9 3.5 - 5.0 g/dL   AST 22 15 - 41 U/L   ALT 19 14 - 54 U/L   Alkaline Phosphatase 88 38 - 126 U/L   Total Bilirubin 0.5 0.3 - 1.2 mg/dL   GFR calc non Af Amer >60 >60 mL/min   GFR calc Af Amer >60 >60 mL/min    Comment: (NOTE) The eGFR has been calculated using the CKD EPI equation. This calculation has not been validated  in all clinical situations. eGFR's persistently <60 mL/min signify possible Chronic Kidney Disease.    Anion gap 10 5 - 15    Comment: Performed at [facility], [place] [address], [place], [place] [address]  CBC     Status: None   Collection Time: 12/16/17  3:35 PM  Result Value Ref Range   WBC 7.8 4.0 - 10.5 K/uL   RBC 4.62 3.87 - 5.11 MIL/uL   Hemoglobin 13.3 12.0 - 15.0 g/dL   HCT 39.7 36.0 - 46.0 %   MCV 85.9 78.0 - 100.0 fL   MCH 28.8 26.0 - 34.0 pg   MCHC 33.5 30.0 - 36.0 g/dL   RDW 14.2 11.5 - 15.5 %   Platelets 257 150 - 400 K/uL    Comment: Performed at [facility], [place] [address], [place], [place] [address]  Urinalysis, Routine w reflex microscopic     Status: Abnormal   Collection Time: 12/16/17  6:36 PM  Result Value Ref Range   Color, Urine YELLOW YELLOW   APPearance CLEAR CLEAR   Specific Gravity, Urine 1.023 1.005 - 1.030   pH 5.0 5.0 - 8.0   Glucose, UA NEGATIVE NEGATIVE mg/dL   Hgb urine dipstick NEGATIVE NEGATIVE   Bilirubin Urine NEGATIVE NEGATIVE   Ketones, ur 5 (A) NEGATIVE mg/dL   Protein, ur NEGATIVE NEGATIVE mg/dL   Nitrite POSITIVE (A) NEGATIVE   Leukocytes, UA LARGE (A) NEGATIVE   RBC / HPF 0-5 0 - 5 RBC/hpf   WBC, UA TOO NUMEROUS TO COUNT 0 - 5 WBC/hpf   Bacteria, UA NONE SEEN NONE SEEN   Squamous Epithelial / LPF 0-5 (A) NONE SEEN   Mucus PRESENT    Hyaline Casts, UA PRESENT     Comment: Performed at [facility], [place] [address], [place], [place] [address]  I-stat troponin, ED     Status: None   Collection Time: 12/16/17  9:49 PM  Result Value Ref Range   Troponin i, poc 0.00 0.00 - 0.08 ng/mL   Comment 3            Comment: Due to the release kinetics of cTnI, a negative result within the first hours of the onset of symptoms does not rule out myocardial infarction with certainty. If myocardial infarction is still suspected, repeat the test at appropriate intervals.   I-Stat  Troponin, ED (not at [facility])     Status: None   Collection Time: 12/17/17  1:50 AM  Result Value Ref Range   Troponin  i, poc 0.02 0.00 - 0.08 ng/mL   Comment 3            Comment: Due to the release kinetics of cTnI, a negative result within the first hours of the onset of symptoms does not rule out myocardial infarction with certainty. If myocardial infarction is still suspected, repeat the test at appropriate intervals.   Troponin I (q 6hr x 3)     Status: None   Collection Time: 12/17/17  1:53 AM  Result Value Ref Range   Troponin I <0.03 <0.03 ng/mL    Comment: Performed at [facility], [place] [address] [place], [place] [address]  Brain natriuretic peptide     Status: None   Collection Time: 12/17/17  9:13 AM  Result Value Ref Range   B Natriuretic Peptide 82.0 0.0 - 100.0 pg/mL    Comment: Performed at [facility], [place] [address], [place], [place] [address]  Troponin I (q 6hr x 3)     Status: Abnormal   Collection Time: 12/17/17  9:13 AM  Result Value Ref Range   Troponin I 0.03 (HH) <0.03 ng/mL    Comment: CRITICAL RESULT CALLED TO, READ BACK BY AND VERIFIED WITHHal Neer RN AT 1015 12/17/17 MULLINS,T Performed at [facility], [place] [address] [place], [place] [address]   Basic metabolic panel     Status: Abnormal   Collection Time: 12/17/17  9:13 AM  Result Value Ref Range   Sodium 144 135 - 145 mmol/L   Potassium 2.9 (L) 3.5 - 5.1 mmol/L   Chloride 106 101 - 111 mmol/L   CO2 28 22 - 32 mmol/L   Glucose, Bld 86 65 - 99 mg/dL   BUN 7 6 - 20 mg/dL   Creatinine, Ser 0.75 0.44 - 1.00 mg/dL   Calcium 8.5 (L) 8.9 - 10.3 mg/dL   GFR calc non Af Amer >60 >60 mL/min   GFR calc Af Amer >60 >60 mL/min    Comment: (NOTE) The eGFR has been calculated using the CKD EPI equation. This calculation has not been validated in all clinical situations. eGFR's persistently <60 mL/min signify possible Chronic  Kidney Disease.    Anion gap 10 5 - 15    Comment: Performed at [facility], [place] [address] [place], [place] [address]    Dg Chest Port 1 View  Result Date: 12/16/2017 CLINICAL DATA:  Dyspnea.  Abdominal pain.  Back pain. EXAM: PORTABLE CHEST 1 VIEW COMPARISON:  Chest radiograph 10/03/2016 FINDINGS: The cardiomediastinal contours are normal. The lungs are clear. Pulmonary vasculature is normal. No consolidation, pleural effusion, or pneumothorax. No acute osseous abnormalities are seen. Minimal proliferative change at the right acromioclavicular joint. IMPRESSION: No acute abnormality. Electronically Signed   By: Jeb Levering M.D.   On: 12/16/2017 21:32   Ct Angio Chest/abd/pel For Dissection W And/or W/wo  Result Date: 12/16/2017 CLINICAL DATA:  Abdominal pain, back pain and hypertension EXAM: CT ANGIOGRAPHY CHEST, ABDOMEN AND PELVIS TECHNIQUE: Multidetector CT imaging through the chest, abdomen and pelvis was performed using the standard protocol during bolus administration of intravenous contrast. Multiplanar reconstructed images and MIPs were obtained and reviewed to evaluate the vascular anatomy. CONTRAST:  172m ISOVUE-370 IOPAMIDOL (ISOVUE-370) INJECTION 76% COMPARISON:  None. FINDINGS: CTA CHEST FINDINGS Cardiovascular: The heart size is normal. There is nopericardial effusion. The course and caliber of the thoracic aorta are normal. There is no aortic atherosclerotic calcification. Precontrast images show no aortic intramural hematoma.  There is no blood pool, dissection or penetrating ulcer demonstrated on arterial phase postcontrast imaging. Normal variant aortic arch branching pattern with the brachiocephalic and left common carotid arteries sharing a common origin. The proximal arch vessels are widely patent. The central pulmonary arteries are normal. Mediastinum/Nodes: No mediastinal, hilar or axillary lymphadenopathy. The visualized thyroid and thoracic esophageal  course are unremarkable. Lungs/Pleura: No pulmonary nodules or masses. No pleural effusion or pneumothorax. No focal airspace consolidation. No focal pleural abnormality. Musculoskeletal: No chest wall abnormality. No acute osseous findings. Review of the MIP images confirms the above findings. CTA ABDOMEN AND PELVIS FINDINGS VASCULAR Aorta: Normal caliber aorta without aneurysm, dissection, vasculitis or hemodynamically significant stenosis. There is no aortic atherosclerosis. Celiac: No aneurysm, dissection or hemodynamically significant stenosis. Normal branching pattern. SMA: Widely patent without dissection or stenosis. Renals: Single renal arteries bilaterally. No aneurysm, dissection, stenosis or evidence of fibromuscular dysplasia. IMA: Patent without abnormality. Inflow: Minimal atherosclerotic calcification without stenosis or other abnormality. Veins: Normal course and caliber of the major veins. Assessment is otherwise limited by the arterial dominant contrast phase. Review of the MIP images confirms the above findings. NON-VASCULAR Hepatobiliary: Normal hepatic contours and density. No visible biliary dilatation. Status post cholecystectomy. Pancreas: Normal contours without ductal dilatation. No peripancreatic fluid collection. Spleen: Normal arterial phase splenic enhancement pattern. Adrenals/Urinary Tract: --Adrenal glands: Intermediate attenuation right adrenal nodule measuring 1.4 cm. --Right kidney/ureter: Right upper pole renal cyst measures 2.9 cm. --Left kidney/ureter: No hydronephrosis or perinephric stranding. No nephrolithiasis. No obstructing ureteral stones. --Urinary bladder: Unremarkable. Stomach/Bowel: --Stomach/Duodenum: Status post gastric bypass. --Small bowel: No dilatation or inflammation. --Colon: No focal abnormality. --Appendix: Normal. Lymphatic:  No abdominal or pelvic lymphadenopathy. Reproductive: Status post hysterectomy. No adnexal mass. Musculoskeletal. No bony spinal  canal stenosis or focal osseous abnormality. Other: None. Review of the MIP images confirms the above findings. IMPRESSION: No acute aortic syndrome or other acute abnormality. Electronically Signed   By: Ulyses Jarred M.D.   On: 12/16/2017 22:15    Review of Systems  Constitutional: Positive for chills.  HENT: Negative for hearing loss.   Eyes: Positive for blurred vision.  Respiratory: Positive for shortness of breath. Negative for cough, hemoptysis and sputum production.   Cardiovascular: Positive for chest pain and palpitations. Negative for claudication, leg swelling and PND.  Gastrointestinal: Positive for abdominal pain and nausea.  Neurological: Positive for dizziness.   Blood pressure (!) 161/77, pulse 72, temperature 98 F (36.7 C), temperature source Oral, resp. rate (!) 21, height '4\' 11"'  (1.499 m), weight 78.9 kg (174 lb), SpO2 100 %. Physical Exam  Constitutional: She is oriented to person, place, and time.  HENT:  Head: Normocephalic and atraumatic.  Eyes: Pupils are equal, round, and reactive to light. Conjunctivae are normal. Left eye exhibits no discharge. No scleral icterus.  Neck: Normal range of motion. Neck supple. No JVD present. No thyromegaly present.  Cardiovascular: Normal rate and regular rhythm.  Murmur (soft systolic murmur noted) heard. Respiratory: Effort normal and breath sounds normal. No respiratory distress. She has no wheezes. She has no rales.  GI: Soft. Bowel sounds are normal. She exhibits no distension. There is no tenderness.  Musculoskeletal: She exhibits no edema, tenderness or deformity.  Neurological: She is alert and oriented to person, place, and time.    Assessment/Plan: Status post chest pain worrisome for angina MI ruled out Minimally elevated troponin I secondary to demand ischemia Uncontrolled hypertension Hyperlipidemia Hypokalemia Obstructive sleep apnea History of morbid obesity status post gastric  bypass Fibromyalgia History  of CVA in the remote past Depression Possible UTI  Plan Agree with present management Start amlodipine 5 mg daily Will schedule for a Lexiscan Myoview in a.m. Check fasting lipid panel in a.m. Charolette Forward 12/17/2017, 11:49 AM

## 2017-12-17 NOTE — H&P (Addendum)
History and Physical    KELLSEY SANSONE XLK:[id number] DOB: [birth year] DOA: 12/16/2017  PCP: Aletha Halim., PA-C  Patient coming from: Home  I have personally briefly reviewed patient's old medical records in [place]  Chief Complaint: Chest pain  HPI: Jodi Bowman is a 57 y.o. female with medical history significant of HTN, CVA, HLD, gastric bypass.  Patient presents to the ED with c/o CP, SOB, nausea, palpitations.  The patient states that this afternoon that she had an episode of dizziness, lightheadedness, palpitations, feeling hot all over, nausea, and feeling as if she couldn't breathe that lasted for several hours before resolving spontaneously.   She had a second episode after she was roomed in the ED along with crushing, central, non-radiating chest pain. HR suddenly went from the 80s to the 160s and her BP was elevated to 190/100. She was treated with 1 mg of Dilaudid and 5 mg lopressor and her heart rate slow returned to the 80-90s, the CP initially remained constant after this but has slowly improved over the remainder of the evening.  ED Course: CTA of chest, abd, pelvis shows no dissection or other obvious acute pathology.  Review of Systems: As per HPI otherwise 10 point review of systems negative.   Past Medical History:  Diagnosis Date  . Arthritis   . Calculus of gallbladder without mention of cholecystitis or obstruction   . Dizziness   . Fibromyalgia   . Goiter   . Hypercholesteremia   . Hypertension   . Lower back pain   . Migraine   . Nontoxic uninodular goiter    sees dr vollmer at [facility]  . Obesity   . Sleep apnea    STOPBANG=5  . Stroke [facility]) 2000    Past Surgical History:  Procedure Laterality Date  . ABDOMINAL HYSTERECTOMY    . CESAREAN SECTION  yrs ago   done x 2  . CHOLECYSTECTOMY  01/05/2012   Procedure: LAPAROSCOPIC CHOLECYSTECTOMY WITH INTRAOPERATIVE CHOLANGIOGRAM;  Surgeon: Pedro Earls, MD;  Location: [facility] ORS;   Service: General;  Laterality: N/A;  . COLONOSCOPY  10/08/2012   Procedure: COLONOSCOPY;  Surgeon: Beryle Beams, MD;  Location: [facility] ENDOSCOPY;  Service: Endoscopy;  Laterality: N/A;  . KNEE ARTHROSCOPY  one 1995 and 1 in 1997   both knees done  . surgery for endometriosis  yrs ago  . thryoid biopsy  December 01, 2011    at [facility]     reports that she has never smoked. She has never used smokeless tobacco. She reports that she drinks alcohol. She reports that she does not use drugs.  Allergies  Allergen Reactions  . Shrimp [Shellfish Allergy] Anaphylaxis  . Naproxen Other (See Comments)    Makes stomach cramp and burning badly.    Family History  Problem Relation Age of Onset  . Diabetes Mother   . Hypertension Mother   . Transient ischemic attack Mother   . Seizures Mother   . Dementia Mother   . Other Father        MVA  . Cancer Brother        colon and lung  . Cancer Maternal Grandmother        colon     Prior to Admission medications   Medication Sig Start Date End Date Taking? Authorizing Provider  DULoxetine (CYMBALTA) 60 MG capsule Take 60 mg by mouth daily. 03/16/17  Yes [provider]  EMBEDA 30-1.2 MG CPCR TAKE 1 CAPSULE BY  MOUTH EVERY 12 HOURS 11/19/17  Yes [provider]  fluticasone (FLONASE) 50 MCG/ACT nasal spray SPRAY 2 SPRAYS INTO EACH NOSTRIL EVERY DAY PRN ALLERGIES 11/08/17  Yes [provider]  folic acid (FOLVITE) 1 MG tablet Take 1 mg by mouth daily. 10/14/17  Yes [provider]  loratadine (CLARITIN) 10 MG tablet Take 10 mg by mouth daily.    Yes [provider]  LYRICA 25 MG capsule TAKE 1 CAPSULE BY MOUTH THREE TIMES A DAY (INSURANCE DENIED PRIOR AUTH) 11/11/17  Yes [provider]  metoprolol succinate (TOPROL-XL) 50 MG 24 hr tablet TAKE 1 TABLET ER 24HR DAILY ORAL 10/04/17  Yes [provider]  pantoprazole (PROTONIX) 40 MG tablet Take 40 mg by mouth 2 (two) times daily.    Yes [provider]  potassium chloride SA (K-DUR,KLOR-CON) 20 MEQ tablet Take 1 tablet (20 mEq total) by mouth 2 (two) times daily. 40/98/11  Yes Delora Fuel, MD  acetaminophen (TYLENOL) 500 MG tablet Take 1,000 mg by mouth every 6 (six) hours as needed for moderate pain or headache.    [provider]  Cholecalciferol 50000 units capsule Take 50,000 Units by mouth every Friday.    [provider]  metoprolol succinate (TOPROL-XL) 100 MG 24 hr tablet Take 1 tablet (100 mg total) by mouth daily. Take with or immediately following a meal. Patient not taking: Reported on 12/16/2017 03/13/14   Charolette Forward, MD  nitroGLYCERIN (NITROSTAT) 0.4 MG SL tablet Place 1 tablet (0.4 mg total) under the tongue every 5 (five) minutes x 3 doses as needed for chest pain. Patient taking differently: Place 0.4 mg under the tongue every 5 (five) minutes as needed for chest pain. Maximum 3 doses 03/13/14   Charolette Forward, MD  metoprolol (LOPRESSOR) 50 MG tablet Take 50 mg by mouth 2 (two) times daily.    12/04/11  [provider]  simvastatin (ZOCOR) 20 MG tablet Take 20 mg by mouth at bedtime.    12/04/11  [provider]    Physical Exam: Vitals:   12/16/17 2110 12/16/17 2244 12/16/17 2300 12/16/17 2330  BP:  (!) 182/100 (!) 189/96 (!) 173/89  Pulse:  81 86 90  Resp:  16 15 16   Temp: (!) 96.5 F (35.8 C)     TempSrc: Rectal     SpO2:  100% 100% 100%  Weight:      Height:        Constitutional: NAD, calm, comfortable Eyes: PERRL, lids and conjunctivae normal ENMT: Mucous membranes are moist. Posterior pharynx clear of any exudate or lesions.Normal dentition.  Neck: normal, supple, no masses, no thyromegaly Respiratory: clear to auscultation bilaterally, no wheezing, no crackles. Normal respiratory effort. No accessory muscle use.  Cardiovascular: Regular rate and rhythm, no murmurs / rubs / gallops. No extremity edema. 2+ pedal pulses. No carotid bruits.  Abdomen: no  tenderness, no masses palpated. No hepatosplenomegaly. Bowel sounds positive.  Musculoskeletal: no clubbing / cyanosis. No joint deformity upper and lower extremities. Good ROM, no contractures. Normal muscle tone.  Skin: no rashes, lesions, ulcers. No induration Neurologic: CN 2-12 grossly intact. Sensation intact, DTR normal. Strength 5/5 in all 4.  Psychiatric: Normal judgment and insight. Alert and oriented x 3. Normal mood.    Labs on Admission: I have personally reviewed following labs and imaging studies  CBC: Recent Labs  Lab 12/16/17 1535  WBC 7.8  HGB 13.3  HCT 39.7  MCV 85.9  PLT 257   Basic  Metabolic Panel: Recent Labs  Lab 12/16/17 1535  NA 139  K 2.5*  CL 102  CO2 27  GLUCOSE 114*  BUN 12  CREATININE 0.82  CALCIUM 9.1   GFR: Estimated Creatinine Clearance: 67.1 mL/min (by C-G formula based on SCr of 0.82 mg/dL). Liver Function Tests: Recent Labs  Lab 12/16/17 1535  AST 22  ALT 19  ALKPHOS 88  BILITOT 0.5  PROT 7.1  ALBUMIN 3.9   Recent Labs  Lab 12/16/17 1535  LIPASE 27   No results for input(s): AMMONIA in the last 168 hours. Coagulation Profile: No results for input(s): INR, PROTIME in the last 168 hours. Cardiac Enzymes: Recent Labs  Lab 12/17/17 0153  TROPONINI <0.03   BNP (last 3 results) No results for input(s): PROBNP in the last 8760 hours. HbA1C: No results for input(s): HGBA1C in the last 72 hours. CBG: No results for input(s): GLUCAP in the last 168 hours. Lipid Profile: No results for input(s): CHOL, HDL, LDLCALC, TRIG, CHOLHDL, LDLDIRECT in the last 72 hours. Thyroid Function Tests: No results for input(s): TSH, T4TOTAL, FREET4, T3FREE, THYROIDAB in the last 72 hours. Anemia Panel: No results for input(s): VITAMINB12, FOLATE, FERRITIN, TIBC, IRON, RETICCTPCT in the last 72 hours. Urine analysis:    Component Value Date/Time   COLORURINE YELLOW 12/16/2017 1836   APPEARANCEUR CLEAR 12/16/2017 1836   LABSPEC 1.023  12/16/2017 1836   PHURINE 5.0 12/16/2017 1836   GLUCOSEU NEGATIVE 12/16/2017 1836   HGBUR NEGATIVE 12/16/2017 1836   HGBUR negative 03/01/2009 0853   BILIRUBINUR NEGATIVE 12/16/2017 1836   KETONESUR 5 (A) 12/16/2017 1836   PROTEINUR NEGATIVE 12/16/2017 1836   UROBILINOGEN 1.0 10/09/2014 2018   NITRITE POSITIVE (A) 12/16/2017 1836   LEUKOCYTESUR LARGE (A) 12/16/2017 1836    Radiological Exams on Admission: Dg Chest Port 1 View  Result Date: 12/16/2017 CLINICAL DATA:  Dyspnea.  Abdominal pain.  Back pain. EXAM: PORTABLE CHEST 1 VIEW COMPARISON:  Chest radiograph 10/03/2016 FINDINGS: The cardiomediastinal contours are normal. The lungs are clear. Pulmonary vasculature is normal. No consolidation, pleural effusion, or pneumothorax. No acute osseous abnormalities are seen. Minimal proliferative change at the right acromioclavicular joint. IMPRESSION: No acute abnormality. Electronically Signed   By: Jeb Levering M.D.   On: 12/16/2017 21:32   Ct Angio Chest/abd/pel For Dissection W And/or W/wo  Result Date: 12/16/2017 CLINICAL DATA:  Abdominal pain, back pain and hypertension EXAM: CT ANGIOGRAPHY CHEST, ABDOMEN AND PELVIS TECHNIQUE: Multidetector CT imaging through the chest, abdomen and pelvis was performed using the standard protocol during bolus administration of intravenous contrast. Multiplanar reconstructed images and MIPs were obtained and reviewed to evaluate the vascular anatomy. CONTRAST:  155mL ISOVUE-370 IOPAMIDOL (ISOVUE-370) INJECTION 76% COMPARISON:  None. FINDINGS: CTA CHEST FINDINGS Cardiovascular: The heart size is normal. There is nopericardial effusion. The course and caliber of the thoracic aorta are normal. There is no aortic atherosclerotic calcification. Precontrast images show no aortic intramural hematoma. There is no blood pool, dissection or penetrating ulcer demonstrated on arterial phase postcontrast imaging. Normal variant aortic arch branching pattern with the  brachiocephalic and left common carotid arteries sharing a common origin. The proximal arch vessels are widely patent. The central pulmonary arteries are normal. Mediastinum/Nodes: No mediastinal, hilar or axillary lymphadenopathy. The visualized thyroid and thoracic esophageal course are unremarkable. Lungs/Pleura: No pulmonary nodules or masses. No pleural effusion or pneumothorax. No focal airspace consolidation. No focal pleural abnormality. Musculoskeletal: No chest wall abnormality. No acute osseous findings. Review of the MIP images  confirms the above findings. CTA ABDOMEN AND PELVIS FINDINGS VASCULAR Aorta: Normal caliber aorta without aneurysm, dissection, vasculitis or hemodynamically significant stenosis. There is no aortic atherosclerosis. Celiac: No aneurysm, dissection or hemodynamically significant stenosis. Normal branching pattern. SMA: Widely patent without dissection or stenosis. Renals: Single renal arteries bilaterally. No aneurysm, dissection, stenosis or evidence of fibromuscular dysplasia. IMA: Patent without abnormality. Inflow: Minimal atherosclerotic calcification without stenosis or other abnormality. Veins: Normal course and caliber of the major veins. Assessment is otherwise limited by the arterial dominant contrast phase. Review of the MIP images confirms the above findings. NON-VASCULAR Hepatobiliary: Normal hepatic contours and density. No visible biliary dilatation. Status post cholecystectomy. Pancreas: Normal contours without ductal dilatation. No peripancreatic fluid collection. Spleen: Normal arterial phase splenic enhancement pattern. Adrenals/Urinary Tract: --Adrenal glands: Intermediate attenuation right adrenal nodule measuring 1.4 cm. --Right kidney/ureter: Right upper pole renal cyst measures 2.9 cm. --Left kidney/ureter: No hydronephrosis or perinephric stranding. No nephrolithiasis. No obstructing ureteral stones. --Urinary bladder: Unremarkable. Stomach/Bowel:  --Stomach/Duodenum: Status post gastric bypass. --Small bowel: No dilatation or inflammation. --Colon: No focal abnormality. --Appendix: Normal. Lymphatic:  No abdominal or pelvic lymphadenopathy. Reproductive: Status post hysterectomy. No adnexal mass. Musculoskeletal. No bony spinal canal stenosis or focal osseous abnormality. Other: None. Review of the MIP images confirms the above findings. IMPRESSION: No acute aortic syndrome or other acute abnormality. Electronically Signed   By: Ulyses Jarred M.D.   On: 12/16/2017 22:15    EKG: Independently reviewed.  Assessment/Plan Principal Problem:   Chest pain, rule out acute myocardial infarction Active Problems:   Essential hypertension   Hypokalemia    1. CP r/o - 1. CP obs pathway 2. Serial trops 3. Tele monitor 4. NPO 5. Cards eval in AM 2. HTN - 1. Continue home BP meds 2. Add PRN labetalol if needed 3. Hypokalemia - 1. Getting 21meq PO and 7meq IV K 2. Repeat BMP in AM 4. UTI - 1. Rocephin 2. Cultures pending  DVT prophylaxis: Lovenox Code Status: Full Family Communication: No family in room Disposition Plan: Home after admit Consults called: Message put in to P.Trent for routine cards eval in AM Admission status: Place in [place], [place] [facility] Pager [phone]  If 7AM-7PM, please contact day team taking care of patient [email or web address] Password [email or web address]  12/17/2017, 2:54 AM

## 2017-12-17 NOTE — Progress Notes (Signed)
Patient seen and evaluated, chart reviewed, please see EMR for updated orders. Please see full H&P dictated by admitting physician Dr Alcario Drought for same date of service.    CP- Neg stress test in May 2016, discussed with patient primary cardiologist Dr. Terrence Dupont, keep n.p.o. for stress test on 12/18/2017   Patient seen and evaluated, chart reviewed, please see EMR for updated orders. Please see full H&P dictated by admitting physician Dr Alcario Drought for same date of service

## 2017-12-17 NOTE — Progress Notes (Signed)
  [facility] set up to pick patient up at 745 am to be at [facility] by 9am.

## 2017-12-17 NOTE — Care Management Obs Status (Signed)
[place] NOTIFICATION   Patient Details  Name: Jodi Bowman MRN: [id number] Date of Birth: 1960-12-14   Medicare Observation Status Notification Given:  Yes    MahabirJuliann Pulse, RN 12/17/2017, 1:58 PM

## 2017-12-17 NOTE — ED Notes (Signed)
ED TO INPATIENT HANDOFF REPORT  Name/Age/Gender Jodi Bowman 57 y.o. female  Code Status    Code Status Orders  (From admission, onward)        Start     Ordered   12/17/17 0251  Full code  Continuous     12/17/17 0251    Code Status History    Date Active Date Inactive Code Status Order ID Comments User Context   07/30/2014 1848 08/01/2014 1448 Full Code [id number]  Verlee Monte, MD Inpatient   03/12/2014 1957 03/13/2014 2153 Full Code [id number]  Charolette Forward, MD Inpatient   01/05/2012 0006 01/05/2012 1113 Full Code [id number]  Malmfelt, Stephani Police, RN Inpatient      Home/SNF/Other Home\  Chief Complaint Weakness; Dizziness; Light Headed  Level of Care/Admitting Diagnosis ED Disposition    ED Disposition Condition [place] [facility]: [facility] [100102]  Level of Care: Telemetry [5]  Admit to tele based on following criteria: Monitor for Ischemic changes  Diagnosis: Chest pain, rule out acute myocardial infarction [854627]  Admitting Physician: Etta Quill Delaplaine  Attending Physician: Etta Quill [[id number]]  PT Class (Do Not Modify): Observation [104]  PT Acc Code (Do Not Modify): Observation [10022]       Medical History Past Medical History:  Diagnosis Date  . Arthritis   . Calculus of gallbladder without mention of cholecystitis or obstruction   . Dizziness   . Fibromyalgia   . Goiter   . Hypercholesteremia   . Hypertension   . Lower back pain   . Migraine   . Nontoxic uninodular goiter    sees dr vollmer at [facility]  . Obesity   . Sleep apnea    STOPBANG=5  . Stroke [facility]) 2000    Allergies Allergies  Allergen Reactions  . Shrimp [Shellfish Allergy] Anaphylaxis  . Naproxen Other (See Comments)    Makes stomach cramp and burning badly.    IV Location/Drains/Wounds Patient Lines/Drains/Airways Status   Active Line/Drains/Airways    Name:   Placement date:   Placement time:   Site:   Days:   PICC  Double Lumen 10/03/16   10/03/16    1924     440          Labs/Imaging Results for orders placed or performed during the hospital encounter of 12/16/17 (from the past 48 hour(s))  Lipase, blood     Status: None   Collection Time: 12/16/17  3:35 PM  Result Value Ref Range   Lipase 27 11 - 51 U/L    Comment: Performed at [facility], [place] [address], [place], [place] [address]  Comprehensive metabolic panel     Status: Abnormal   Collection Time: 12/16/17  3:35 PM  Result Value Ref Range   Sodium 139 135 - 145 mmol/L   Potassium 2.5 (LL) 3.5 - 5.1 mmol/L    Comment: CRITICAL RESULT CALLED TO, READ BACK BY AND VERIFIED WITH: TIM SMITH,RN [id number] @ 1620 BY J SCOTTON    Chloride 102 101 - 111 mmol/L   CO2 27 22 - 32 mmol/L   Glucose, Bld 114 (H) 65 - 99 mg/dL   BUN 12 6 - 20 mg/dL   Creatinine, Ser 0.82 0.44 - 1.00 mg/dL   Calcium 9.1 8.9 - 10.3 mg/dL   Total Protein 7.1 6.5 - 8.1 g/dL   Albumin 3.9 3.5 - 5.0 g/dL   AST 22 15 - 41 U/L   ALT 19  14 - 54 U/L   Alkaline Phosphatase 88 38 - 126 U/L   Total Bilirubin 0.5 0.3 - 1.2 mg/dL   GFR calc non Af Amer >60 >60 mL/min   GFR calc Af Amer >60 >60 mL/min    Comment: (NOTE) The eGFR has been calculated using the CKD EPI equation. This calculation has not been validated in all clinical situations. eGFR's persistently <60 mL/min signify possible Chronic Kidney Disease.    Anion gap 10 5 - 15    Comment: Performed at [facility], [place] [address], [place], [place] [address]  CBC     Status: None   Collection Time: 12/16/17  3:35 PM  Result Value Ref Range   WBC 7.8 4.0 - 10.5 K/uL   RBC 4.62 3.87 - 5.11 MIL/uL   Hemoglobin 13.3 12.0 - 15.0 g/dL   HCT 39.7 36.0 - 46.0 %   MCV 85.9 78.0 - 100.0 fL   MCH 28.8 26.0 - 34.0 pg   MCHC 33.5 30.0 - 36.0 g/dL   RDW 14.2 11.5 - 15.5 %   Platelets 257 150 - 400 K/uL    Comment: Performed at [facility], [place] [address],  [place], [place] [address]  Urinalysis, Routine w reflex microscopic     Status: Abnormal   Collection Time: 12/16/17  6:36 PM  Result Value Ref Range   Color, Urine YELLOW YELLOW   APPearance CLEAR CLEAR   Specific Gravity, Urine 1.023 1.005 - 1.030   pH 5.0 5.0 - 8.0   Glucose, UA NEGATIVE NEGATIVE mg/dL   Hgb urine dipstick NEGATIVE NEGATIVE   Bilirubin Urine NEGATIVE NEGATIVE   Ketones, ur 5 (A) NEGATIVE mg/dL   Protein, ur NEGATIVE NEGATIVE mg/dL   Nitrite POSITIVE (A) NEGATIVE   Leukocytes, UA LARGE (A) NEGATIVE   RBC / HPF 0-5 0 - 5 RBC/hpf   WBC, UA TOO NUMEROUS TO COUNT 0 - 5 WBC/hpf   Bacteria, UA NONE SEEN NONE SEEN   Squamous Epithelial / LPF 0-5 (A) NONE SEEN   Mucus PRESENT    Hyaline Casts, UA PRESENT     Comment: Performed at [facility], [place] [address] [place],  [address]  I-stat troponin, ED     Status: None   Collection Time: 12/16/17  9:49 PM  Result Value Ref Range   Troponin i, poc 0.00 0.00 - 0.08 ng/mL   Comment 3            Comment: Due to the release kinetics of cTnI, a negative result within the first hours of the onset of symptoms does not rule out myocardial infarction with certainty. If myocardial infarction is still suspected, repeat the test at appropriate intervals.   I-Stat Troponin, ED (not at [facility])     Status: None   Collection Time: 12/17/17  1:50 AM  Result Value Ref Range   Troponin i, poc 0.02 0.00 - 0.08 ng/mL   Comment 3            Comment: Due to the release kinetics of cTnI, a negative result within the first hours of the onset of symptoms does not rule out myocardial infarction with certainty. If myocardial infarction is still suspected, repeat the test at appropriate intervals.   Troponin I (q 6hr x 3)     Status: None   Collection Time: 12/17/17  1:53 AM  Result Value Ref Range   Troponin I <0.03 <0.03 ng/mL    Comment: Performed  at [facility], [place] [address], [place],  [place] [address]   Dg Chest Port 1 View  Result Date: 12/16/2017 CLINICAL DATA:  Dyspnea.  Abdominal pain.  Back pain. EXAM: PORTABLE CHEST 1 VIEW COMPARISON:  Chest radiograph 10/03/2016 FINDINGS: The cardiomediastinal contours are normal. The lungs are clear. Pulmonary vasculature is normal. No consolidation, pleural effusion, or pneumothorax. No acute osseous abnormalities are seen. Minimal proliferative change at the right acromioclavicular joint. IMPRESSION: No acute abnormality. Electronically Signed   By: Jeb Levering M.D.   On: 12/16/2017 21:32   Ct Angio Chest/abd/pel For Dissection W And/or W/wo  Result Date: 12/16/2017 CLINICAL DATA:  Abdominal pain, back pain and hypertension EXAM: CT ANGIOGRAPHY CHEST, ABDOMEN AND PELVIS TECHNIQUE: Multidetector CT imaging through the chest, abdomen and pelvis was performed using the standard protocol during bolus administration of intravenous contrast. Multiplanar reconstructed images and MIPs were obtained and reviewed to evaluate the vascular anatomy. CONTRAST:  137m ISOVUE-370 IOPAMIDOL (ISOVUE-370) INJECTION 76% COMPARISON:  None. FINDINGS: CTA CHEST FINDINGS Cardiovascular: The heart size is normal. There is nopericardial effusion. The course and caliber of the thoracic aorta are normal. There is no aortic atherosclerotic calcification. Precontrast images show no aortic intramural hematoma. There is no blood pool, dissection or penetrating ulcer demonstrated on arterial phase postcontrast imaging. Normal variant aortic arch branching pattern with the brachiocephalic and left common carotid arteries sharing a common origin. The proximal arch vessels are widely patent. The central pulmonary arteries are normal. Mediastinum/Nodes: No mediastinal, hilar or axillary lymphadenopathy. The visualized thyroid and thoracic esophageal course are unremarkable. Lungs/Pleura: No pulmonary nodules or masses. No pleural effusion or pneumothorax. No focal airspace  consolidation. No focal pleural abnormality. Musculoskeletal: No chest wall abnormality. No acute osseous findings. Review of the MIP images confirms the above findings. CTA ABDOMEN AND PELVIS FINDINGS VASCULAR Aorta: Normal caliber aorta without aneurysm, dissection, vasculitis or hemodynamically significant stenosis. There is no aortic atherosclerosis. Celiac: No aneurysm, dissection or hemodynamically significant stenosis. Normal branching pattern. SMA: Widely patent without dissection or stenosis. Renals: Single renal arteries bilaterally. No aneurysm, dissection, stenosis or evidence of fibromuscular dysplasia. IMA: Patent without abnormality. Inflow: Minimal atherosclerotic calcification without stenosis or other abnormality. Veins: Normal course and caliber of the major veins. Assessment is otherwise limited by the arterial dominant contrast phase. Review of the MIP images confirms the above findings. NON-VASCULAR Hepatobiliary: Normal hepatic contours and density. No visible biliary dilatation. Status post cholecystectomy. Pancreas: Normal contours without ductal dilatation. No peripancreatic fluid collection. Spleen: Normal arterial phase splenic enhancement pattern. Adrenals/Urinary Tract: --Adrenal glands: Intermediate attenuation right adrenal nodule measuring 1.4 cm. --Right kidney/ureter: Right upper pole renal cyst measures 2.9 cm. --Left kidney/ureter: No hydronephrosis or perinephric stranding. No nephrolithiasis. No obstructing ureteral stones. --Urinary bladder: Unremarkable. Stomach/Bowel: --Stomach/Duodenum: Status post gastric bypass. --Small bowel: No dilatation or inflammation. --Colon: No focal abnormality. --Appendix: Normal. Lymphatic:  No abdominal or pelvic lymphadenopathy. Reproductive: Status post hysterectomy. No adnexal mass. Musculoskeletal. No bony spinal canal stenosis or focal osseous abnormality. Other: None. Review of the MIP images confirms the above findings. IMPRESSION: No  acute aortic syndrome or other acute abnormality. Electronically Signed   By: KUlyses JarredM.D.   On: 12/16/2017 22:15    Pending Labs Unresulted Labs (From admission, onward)   Start     Ordered   12/17/17 00156 Basic metabolic panel  Tomorrow morning,   R     12/17/17 0249   12/17/17 0251  HIV antibody (Routine Testing)  Once,   R     12/17/17 0251   12/17/17 0053  Troponin I (q 6hr x 3)  Now then every 6 hours,   R     12/17/17 0052   12/16/17 2100  Brain natriuretic peptide  Once,   STAT     12/16/17 2059   12/16/17 2100  Urine culture  STAT,   STAT     12/16/17 2059   12/16/17 2100  Culture, blood (routine x 2)  BLOOD CULTURE X 2,   STAT     12/16/17 2059      Vitals/Pain Today's Vitals   12/16/17 2110 12/16/17 2244 12/16/17 2300 12/16/17 2330  BP:  (!) 182/100 (!) 189/96 (!) 173/89  Pulse:  81 86 90  Resp:  _0 Temp: (!) 96.5 F (35.8 C)     TempSrc: Rectal     SpO2:  100% 100% 100%  Weight:      Height:        Isolation Precautions No active isolations  Medications Medications  potassium chloride 10 mEq in 100 mL IVPB (0 mEq Intravenous Stopped 12/17/17 0057)  metoprolol tartrate (LOPRESSOR) 5 MG/5ML injection (has no administration in time range)  cefTRIAXone (ROCEPHIN) 1 g in sodium chloride 0.9 % 100 mL IVPB (has no administration in time range)  acetaminophen (TYLENOL) tablet 650 mg (has no administration in time range)  ondansetron (ZOFRAN) injection 4 mg (has no administration in time range)  enoxaparin (LOVENOX) injection 40 mg (has no administration in time range)  morphine 2 MG/ML injection 2 mg (has no administration in time range)  potassium chloride SA (K-DUR,KLOR-CON) CR tablet 40 mEq (has no administration in time range)  Morphine-Naltrexone 30-1.2 MG CPCR 1 capsule (has no administration in time range)  pregabalin (LYRICA) capsule 25 mg (has no administration in time range)  metoprolol succinate (TOPROL-XL) 24 hr tablet 50 mg (has no  administration in time range)  pantoprazole (PROTONIX) EC tablet 40 mg (has no administration in time range)  potassium chloride SA (K-DUR,KLOR-CON) CR tablet 20 mEq (has no administration in time range)  loratadine (CLARITIN) tablet 10 mg (has no administration in time range)  folic acid (FOLVITE) tablet 1 mg (has no administration in time range)  DULoxetine (CYMBALTA) DR capsule 60 mg (has no administration in time range)  acetaminophen (TYLENOL) tablet 1,000 mg (has no administration in time range)  ondansetron (ZOFRAN-ODT) disintegrating tablet 4 mg (4 mg Oral Given 12/16/17 1441)  metoprolol tartrate (LOPRESSOR) injection 5 mg (5 mg Intravenous Given 12/16/17 2054)  HYDROmorphone (DILAUDID) injection 1 mg (1 mg Intravenous Given 12/16/17 2058)  cefTRIAXone (ROCEPHIN) 1 g in sodium chloride 0.9 % 100 mL IVPB (0 g Intravenous Stopped 12/17/17 0057)  iopamidol (ISOVUE-370) 76 % injection (100 mLs  Contrast Given 12/16/17 2146)  pantoprazole (PROTONIX) injection 40 mg (40 mg Intravenous Given 12/16/17 2313)  morphine 4 MG/ML injection 4 mg (4 mg Intravenous Given 12/16/17 2323)  labetalol (NORMODYNE,TRANDATE) injection 10 mg (10 mg Intravenous Given 12/16/17 2308)    Mobility walks with person assist

## 2017-12-17 NOTE — Progress Notes (Signed)
CRITICAL VALUE ALERT  Critical Value:  Troponin of .03  Date & Time Notied:  3/21 @ 1025  Provider Notified: Courage, E   Orders Received/Actions taken:

## 2017-12-18 ENCOUNTER — Ambulatory Visit (HOSPITAL_COMMUNITY)
Admit: 2017-12-18 | Discharge: 2017-12-18 | Disposition: A | Discharge status: Home / Self Care | Payer: Medicare Other | Attending: Cardiology | Admitting: Cardiology

## 2017-12-18 DIAGNOSIS — R079 Chest pain, unspecified: Secondary | ICD-10-CM | POA: Insufficient documentation

## 2017-12-18 DIAGNOSIS — E7800 Pure hypercholesterolemia, unspecified: Secondary | ICD-10-CM | POA: Insufficient documentation

## 2017-12-18 DIAGNOSIS — I110 Hypertensive heart disease with heart failure: Secondary | ICD-10-CM

## 2017-12-18 DIAGNOSIS — I509 Heart failure, unspecified: Secondary | ICD-10-CM | POA: Insufficient documentation

## 2017-12-18 DIAGNOSIS — E119 Type 2 diabetes mellitus without complications: Secondary | ICD-10-CM | POA: Insufficient documentation

## 2017-12-18 DIAGNOSIS — Z8673 Personal history of transient ischemic attack (TIA), and cerebral infarction without residual deficits: Secondary | ICD-10-CM | POA: Insufficient documentation

## 2017-12-18 DIAGNOSIS — R0602 Shortness of breath: Secondary | ICD-10-CM | POA: Insufficient documentation

## 2017-12-18 LAB — BASIC METABOLIC PANEL
Anion gap: 7 (ref 5–15)
BUN: 7 mg/dL (ref 6–20)
CO2: 28 mmol/L (ref 22–32)
Calcium: 8.6 mg/dL — ABNORMAL LOW (ref 8.9–10.3)
Chloride: 109 mmol/L (ref 101–111)
Creatinine, Ser: 0.77 mg/dL (ref 0.44–1.00)
GFR calc Af Amer: >60 mL/min (ref >60)
GFR calc non Af Amer: >60 mL/min (ref >60)
Glucose, Bld: 86 mg/dL (ref 65–99)
Potassium: 3.2 mmol/L — ABNORMAL LOW (ref 3.5–5.1)
Sodium: 144 mmol/L (ref 135–145)

## 2017-12-18 LAB — LIPID PANEL
Cholesterol: 101 mg/dL (ref 0–200)
HDL: 39 mg/dL — ABNORMAL LOW (ref >40)
LDL Cholesterol: 51 mg/dL (ref 0–99)
Total CHOL/HDL Ratio: 2.6 RATIO
Triglycerides: 56 mg/dL (ref <150)
VLDL: 11 mg/dL (ref 0–40)

## 2017-12-18 LAB — URINE CULTURE: Culture: <10000 — AB

## 2017-12-18 LAB — HIV ANTIBODY (ROUTINE TESTING W REFLEX): HIV Screen 4th Generation wRfx: NONREACTIVE

## 2017-12-18 LAB — MAGNESIUM: Magnesium: 1.5 mg/dL — ABNORMAL LOW (ref 1.7–2.4)

## 2017-12-18 MED ORDER — TECHNETIUM TC 99M TETROFOSMIN IV KIT
10.0000 | PACK | Freq: Once | INTRAVENOUS | Status: AC | PRN
  Administered 2017-12-18: 10 via INTRAVENOUS

## 2017-12-18 MED ORDER — REGADENOSON 0.4 MG/5ML IV SOLN
INTRAVENOUS | Status: AC
  Administered 2017-12-18: 0.4 mg via INTRAVENOUS
  Filled 2017-12-18: qty 5

## 2017-12-18 MED ORDER — POTASSIUM CHLORIDE CRYS ER 20 MEQ PO TBCR
40.0000 meq | EXTENDED_RELEASE_TABLET | Freq: Once | ORAL | Status: DC
  Filled 2017-12-18: qty 2

## 2017-12-18 MED ORDER — TECHNETIUM TC 99M TETROFOSMIN IV KIT
30.0000 | PACK | Freq: Once | INTRAVENOUS | Status: AC | PRN
  Administered 2017-12-18: 30 via INTRAVENOUS

## 2017-12-18 MED ORDER — REGADENOSON 0.4 MG/5ML IV SOLN
0.4000 mg | Freq: Once | INTRAVENOUS | Status: AC
  Administered 2017-12-18: 0.4 mg via INTRAVENOUS

## 2017-12-18 MED ORDER — METOPROLOL SUCCINATE ER 50 MG PO TB24: ORAL_TABLET | ORAL | 3 refills | Status: DC

## 2017-12-18 MED ORDER — MAGNESIUM SULFATE 2 GM/50ML IV SOLN
2.0000 g | Freq: Once | INTRAVENOUS | Status: AC
  Administered 2017-12-18: 2 g via INTRAVENOUS
  Filled 2017-12-18: qty 50

## 2017-12-18 MED ORDER — SPIRONOLACTONE 25 MG PO TABS: 25.0000 mg | ORAL_TABLET | Freq: Every day | ORAL | 1 refills | Status: DC

## 2017-12-18 MED ORDER — AMLODIPINE BESYLATE 5 MG PO TABS: 5.0000 mg | ORAL_TABLET | Freq: Every day | ORAL | 1 refills | Status: DC

## 2017-12-18 MED ORDER — POTASSIUM CHLORIDE CRYS ER 20 MEQ PO TBCR
40.0000 meq | EXTENDED_RELEASE_TABLET | Freq: Once | ORAL | Status: AC
  Administered 2017-12-18: 40 meq via ORAL

## 2017-12-18 MED ORDER — ASPIRIN EC 81 MG PO TBEC: 81.0000 mg | DELAYED_RELEASE_TABLET | Freq: Every day | ORAL | 2 refills | Status: AC

## 2017-12-18 NOTE — Discharge Instructions (Signed)
Low  Salt diet advised We will up with Dr. Terrence Dupont your Cardiologist for blood pressure recheck within 1 week Please Note some changes to your blood pressure medications You Need repeat BMP and magnesium test with Dr. Terrence Dupont in a week

## 2017-12-18 NOTE — Progress Notes (Signed)
Pt to be discharged to home this afternoon. Discharge instructions including Medications new medications and schedules gone over with Pt. Pt verbalized understanding of all discharge teaching/instructions. Discharge Packet with Prescriptions given to Pt.

## 2017-12-18 NOTE — Discharge Summary (Addendum)
Jodi Bowman, is a 57 y.o. female  DOB 12-07-1960  MRN [id number].  Admission date:  12/16/2017  Admitting Physician  Etta Quill, DO  Discharge Date:  12/18/2017   Primary MD  Aletha Halim., PA-C  Recommendations for primary care physician for things to follow:   Low  Salt diet advised We will up with Dr. Terrence Dupont your Cardiologist for blood pressure recheck within 1 week Please Note some changes to your blood pressure medications You Need repeat BMP and magnesium test with Dr. Terrence Dupont in a week   Admission Diagnosis  Hypokalemia [E87.6] Near syncope [R55] Chest pain, unspecified type [R07.9]   Discharge Diagnosis  Hypokalemia [E87.6] Near syncope [R55] Chest pain, unspecified type [R07.9]    Principal Problem:   Chest pain, rule out acute myocardial infarction Active Problems:   Essential hypertension   Hypokalemia   Acute lower UTI      Past Medical History:  Diagnosis Date  . Arthritis   . Calculus of gallbladder without mention of cholecystitis or obstruction   . Dizziness   . Fibromyalgia   . Goiter   . Hypercholesteremia   . Hypertension   . Lower back pain   . Migraine   . Nontoxic uninodular goiter    sees dr vollmer at [facility]  . Obesity   . Sleep apnea    STOPBANG=5  . Stroke [facility]) 2000    Past Surgical History:  Procedure Laterality Date  . ABDOMINAL HYSTERECTOMY    . CESAREAN SECTION  yrs ago   done x 2  . CHOLECYSTECTOMY  01/05/2012   Procedure: LAPAROSCOPIC CHOLECYSTECTOMY WITH INTRAOPERATIVE CHOLANGIOGRAM;  Surgeon: Pedro Earls, MD;  Location: [facility] ORS;  Service: General;  Laterality: N/A;  . COLONOSCOPY  10/08/2012   Procedure: COLONOSCOPY;  Surgeon: Beryle Beams, MD;  Location: [facility] ENDOSCOPY;  Service: Endoscopy;  Laterality: N/A;  . KNEE ARTHROSCOPY  one 1995 and 1 in 1997   both knees done  . surgery for endometriosis  yrs ago  .  thryoid biopsy  December 01, 2011    at [facility]     HPI  from the history and physical done on the day of admission:     Chief Complaint: Chest pain  HPI: Jodi Bowman is a 57 y.o. female with medical history significant of HTN, CVA, HLD, gastric bypass.  Patient presents to the ED with c/o CP, SOB, nausea, palpitations.  The patient states that this afternoon that she had an episode of dizziness, lightheadedness,palpitations,feeling hot all over, nausea, and feeling as if she couldn't breathe that lasted for several hours before resolving spontaneously.  She had a second episodeafter she was roomed in the ED along with crushing, central, non-radiating chest pain. HR suddenly went from the 80s to the 160s and her BP was elevated to 190/100. She was treated with 1 mg of Dilaudid and 5 mg lopressor and her heart rate slow returned to the 80-90s, the CP initially remained constant after this but has slowly improved  over the remainder of the evening.  ED Course: CTA of chest, abd, pelvis shows no dissection or other obvious acute pathology    Hospital Course:    1)Atypical CP-rule out for ACS by cardiac enzymes and EKG, Lexiscan stress test on 12/18/2017 without reversible ischemia, EF on gated images is preserved at over 55%,, follow-up with Dr. Terrence Dupont as outpatient, metoprolol 50 mg daily and aspirin 81 mg advised  2)HTN-Dr. Terrence Dupont added Aldactone 25 mg daily, continue metoprolol, BP recheck with Dr. Terrence Dupont within a week.  Dr. Terrence Dupont will consider workup for secondary causes of hypertension giving unexplained hypokalemia and hypomagnesemia in a patient with stage II hypertension  3)Hypokalemia/Hypomagnesemia-denies significant GI losses, replaced, recheck BMP and Magnesium with Dr. Terrence Dupont within a week  4)Obesity/OSA-patient is status post previous gastric bypass couple of years ago, CPAP use encouraged  5) possible UTI-on admission UA was suspicious for UTI, patient was empirically  treated with IV Rocephin, urine cultures negative, no further antibiotics indicated at this time  Discharge Condition: stable  Follow UP- Dr Terrence Dupont   Consults obtained - Cardiology  Diet and Activity recommendation:  As advised  Discharge Instructions    Discharge Instructions    Call MD for:  difficulty breathing, headache or visual disturbances   Complete by:  As directed    Call MD for:  persistant dizziness or light-headedness   Complete by:  As directed    Call MD for:  persistant nausea and vomiting   Complete by:  As directed    Call MD for:  redness, tenderness, or signs of infection (pain, swelling, redness, odor or green/yellow discharge around incision site)   Complete by:  As directed    Call MD for:  temperature >100.4   Complete by:  As directed    Diet - low sodium heart healthy   Complete by:  As directed    Discharge instructions   Complete by:  As directed    Low  Salt diet advised We will up with Dr. Terrence Dupont your Cardiologist for blood pressure recheck within 1 week Please Note some changes to your blood pressure medications You Need repeat BMP and magnesium test with Dr. Terrence Dupont in a week   Increase activity slowly   Complete by:  As directed       Discharge Medications     Allergies as of 12/18/2017      Reactions   Shrimp [shellfish Allergy] Anaphylaxis   Naproxen Other (See Comments)   Makes stomach cramp and burning badly.      Medication List    TAKE these medications   acetaminophen 500 MG tablet Commonly known as:  TYLENOL Take 1,000 mg by mouth every 6 (six) hours as needed for moderate pain or headache.   amLODipine 5 MG tablet Commonly known as:  NORVASC Take 1 tablet (5 mg total) by mouth daily. Start taking on:  12/19/2017   aspirin EC 81 MG tablet Take 1 tablet (81 mg total) by mouth daily. With food   Cholecalciferol 50000 units capsule Take 50,000 Units by mouth every Friday.   DULoxetine 60 MG capsule Commonly known  as:  CYMBALTA Take 60 mg by mouth daily.   EMBEDA 30-1.2 MG Cpcr Generic drug:  Morphine-Naltrexone TAKE 1 CAPSULE BY MOUTH EVERY 12 HOURS   fluticasone 50 MCG/ACT nasal spray Commonly known as:  FLONASE SPRAY 2 SPRAYS INTO EACH NOSTRIL EVERY DAY PRN ALLERGIES   folic acid 1 MG tablet Commonly known as:  FOLVITE Take 1  mg by mouth daily.   loratadine 10 MG tablet Commonly known as:  CLARITIN Take 10 mg by mouth daily.   LYRICA 25 MG capsule Generic drug:  pregabalin TAKE 1 CAPSULE BY MOUTH THREE TIMES A DAY (INSURANCE DENIED PRIOR AUTH)   metoprolol succinate 50 MG 24 hr tablet Commonly known as:  TOPROL-XL TAKE 1 TABLET ER 24HR DAILY ORAL   nitroGLYCERIN 0.4 MG SL tablet Commonly known as:  NITROSTAT Place 1 tablet (0.4 mg total) under the tongue every 5 (five) minutes x 3 doses as needed for chest pain. What changed:    when to take this  additional instructions   pantoprazole 40 MG tablet Commonly known as:  PROTONIX Take 40 mg by mouth 2 (two) times daily.   potassium chloride SA 20 MEQ tablet Commonly known as:  K-DUR,KLOR-CON Take 1 tablet (20 mEq total) by mouth 2 (two) times daily.   spironolactone 25 MG tablet Commonly known as:  ALDACTONE Take 1 tablet (25 mg total) by mouth daily.      Major procedures and Radiology Reports - PLEASE review detailed and final reports for all details, in brief -    Nm Myocar Multi W/spect W/wall Motion / Ef  Result Date: 12/18/2017 CLINICAL DATA:  Chest pain. Prior stroke. Diabetes. Hypertension. Hypercholesterolemia. Shortness of breath. Congestive heart failure. EXAM: MYOCARDIAL IMAGING WITH SPECT (REST AND PHARMACOLOGIC-STRESS) GATED LEFT VENTRICULAR WALL MOTION STUDY LEFT VENTRICULAR EJECTION FRACTION TECHNIQUE: Standard myocardial SPECT imaging was performed after resting intravenous injection of 10 mCi Tc-26m tetrofosmin. Subsequently, intravenous infusion of Lexiscan was performed under the supervision of the  Cardiology staff. At peak effect of the drug, 30 mCi Tc-37m tetrofosmin was injected intravenously and standard myocardial SPECT imaging was performed. Quantitative gated imaging was also performed to evaluate left ventricular wall motion, and estimate left ventricular ejection fraction. COMPARISON:  02/12/2015 FINDINGS: Perfusion: Reduced activity in the cardiac apex on both rest and stress images, extending mildly in the anterior wall, favoring mild scarring. No inducible ischemia identified. Wall Motion: Equivocal septal hypokinesis at the cardiac base. Otherwise normal wall motion and wall thickening. Left Ventricular Ejection Fraction: 55 % End diastolic volume 85 ml End systolic volume 38 ml IMPRESSION: 1. Apical scarring or apical thinning, with slight extension into the adjacent anterior wall. No inducible ischemia . 2. Equivocal septal hypokinesis at the cardiac base. 3. Left ventricular ejection fraction 55% 4. Non invasive risk stratification*: Low *2012 Appropriate Use Criteria for Coronary Revascularization Focused Update: J Am Coll Cardiol. 2025;42(7):062-376. http://content.airportbarriers.com.aspx?[email or web address] Electronically Signed   By: Van Clines M.D.   On: 12/18/2017 13:07   Dg Chest Port 1 View  Result Date: 12/16/2017 CLINICAL DATA:  Dyspnea.  Abdominal pain.  Back pain. EXAM: PORTABLE CHEST 1 VIEW COMPARISON:  Chest radiograph 10/03/2016 FINDINGS: The cardiomediastinal contours are normal. The lungs are clear. Pulmonary vasculature is normal. No consolidation, pleural effusion, or pneumothorax. No acute osseous abnormalities are seen. Minimal proliferative change at the right acromioclavicular joint. IMPRESSION: No acute abnormality. Electronically Signed   By: Jeb Levering M.D.   On: 12/16/2017 21:32   Ct Angio Chest/abd/pel For Dissection W And/or W/wo  Result Date: 12/16/2017 CLINICAL DATA:  Abdominal pain, back pain and hypertension EXAM: CT ANGIOGRAPHY CHEST,  ABDOMEN AND PELVIS TECHNIQUE: Multidetector CT imaging through the chest, abdomen and pelvis was performed using the standard protocol during bolus administration of intravenous contrast. Multiplanar reconstructed images and MIPs were obtained and reviewed to evaluate the vascular anatomy. CONTRAST:  1103mL ISOVUE-370  IOPAMIDOL (ISOVUE-370) INJECTION 76% COMPARISON:  None. FINDINGS: CTA CHEST FINDINGS Cardiovascular: The heart size is normal. There is nopericardial effusion. The course and caliber of the thoracic aorta are normal. There is no aortic atherosclerotic calcification. Precontrast images show no aortic intramural hematoma. There is no blood pool, dissection or penetrating ulcer demonstrated on arterial phase postcontrast imaging. Normal variant aortic arch branching pattern with the brachiocephalic and left common carotid arteries sharing a common origin. The proximal arch vessels are widely patent. The central pulmonary arteries are normal. Mediastinum/Nodes: No mediastinal, hilar or axillary lymphadenopathy. The visualized thyroid and thoracic esophageal course are unremarkable. Lungs/Pleura: No pulmonary nodules or masses. No pleural effusion or pneumothorax. No focal airspace consolidation. No focal pleural abnormality. Musculoskeletal: No chest wall abnormality. No acute osseous findings. Review of the MIP images confirms the above findings. CTA ABDOMEN AND PELVIS FINDINGS VASCULAR Aorta: Normal caliber aorta without aneurysm, dissection, vasculitis or hemodynamically significant stenosis. There is no aortic atherosclerosis. Celiac: No aneurysm, dissection or hemodynamically significant stenosis. Normal branching pattern. SMA: Widely patent without dissection or stenosis. Renals: Single renal arteries bilaterally. No aneurysm, dissection, stenosis or evidence of fibromuscular dysplasia. IMA: Patent without abnormality. Inflow: Minimal atherosclerotic calcification without stenosis or other  abnormality. Veins: Normal course and caliber of the major veins. Assessment is otherwise limited by the arterial dominant contrast phase. Review of the MIP images confirms the above findings. NON-VASCULAR Hepatobiliary: Normal hepatic contours and density. No visible biliary dilatation. Status post cholecystectomy. Pancreas: Normal contours without ductal dilatation. No peripancreatic fluid collection. Spleen: Normal arterial phase splenic enhancement pattern. Adrenals/Urinary Tract: --Adrenal glands: Intermediate attenuation right adrenal nodule measuring 1.4 cm. --Right kidney/ureter: Right upper pole renal cyst measures 2.9 cm. --Left kidney/ureter: No hydronephrosis or perinephric stranding. No nephrolithiasis. No obstructing ureteral stones. --Urinary bladder: Unremarkable. Stomach/Bowel: --Stomach/Duodenum: Status post gastric bypass. --Small bowel: No dilatation or inflammation. --Colon: No focal abnormality. --Appendix: Normal. Lymphatic:  No abdominal or pelvic lymphadenopathy. Reproductive: Status post hysterectomy. No adnexal mass. Musculoskeletal. No bony spinal canal stenosis or focal osseous abnormality. Other: None. Review of the MIP images confirms the above findings. IMPRESSION: No acute aortic syndrome or other acute abnormality. Electronically Signed   By: Ulyses Jarred M.D.   On: 12/16/2017 22:15   Micro Results   Recent Results (from the past 240 hour(s))  Culture, blood (routine x 2)     Status: None (Preliminary result)   Collection Time: 12/16/17  9:30 PM  Result Value Ref Range Status   Specimen Description   Final    BLOOD LEFT ARM Performed at [facility] [address] [place], [place] [address]    Special Requests   Final    BOTTLES DRAWN AEROBIC AND ANAEROBIC Blood Culture adequate volume Performed at [facility] [address], [place], [place] [address]    Culture   Final    NO GROWTH 2 DAYS Performed at [facility] [address], [place], [place] [address]    Report Status PENDING  Incomplete  Culture, blood (routine x 2)     Status: None (Preliminary result)   Collection Time: 12/16/17  9:35 PM  Result Value Ref Range Status   Specimen Description   Final    BLOOD RIGHT ARM Performed at [facility] [address], [place],  [address]    Special Requests   Final    BOTTLES DRAWN AEROBIC AND ANAEROBIC Blood Culture adequate volume Performed at [facility], [place] [address], [place], [place] [address]    Culture   Final    NO GROWTH 2 DAYS Performed at [facility] [address], [place], [place] [address]    Report Status PENDING  Incomplete  Urine culture     Status: Abnormal   Collection Time: 12/17/17 12:59 AM  Result Value Ref Range Status   Specimen Description   Final    URINE, RANDOM Performed at [facility] [address], [place], [place] [address]    Special Requests   Final    NONE Performed at [facility], [place] [address], [place], [place] [address]    Culture (A)  Final    <10,000 COLONIES/mL INSIGNIFICANT GROWTH Performed at [facility] [address] [place], [place] [address]    Report Status 12/18/2017 FINAL  Final   Today   Subjective    Jerris Pender today has no new complaints, no f/c, no further chest pains, no palpitations no dizziness,          Patient has been seen and examined prior to discharge   Objective   Blood pressure 138/69, pulse 67, temperature 98.6 F (37 C), temperature source Oral, resp. rate 16, height 4\' 11"  (1.499 m), weight 78.9 kg (174 lb), SpO2 99 %.   Intake/Output Summary (Last 24 hours) at 12/18/2017 1637 Last data filed at 12/18/2017 0221 Gross per 24 hour  Intake 460 ml  Output 900 ml  Net -440 ml   Exam Gen:- Awake Alert,  In no apparent distress  HEENT:- Blum.AT, No sclera icterus Neck-Supple Neck,No JVD,.  Lungs-   CTAB , fair air movement CV- S1, S2 normal Abd-  +ve B.Sounds, Abd Soft, No tenderness,    Extremity/Skin:- No  edema,   intact pulses Psych-affect is appropriate, oriented x3 Neuro-no new focal deficits, no tremors   Data Review   CBC w Diff:  Lab Results  Component Value Date   WBC 7.8 12/16/2017   HGB 13.3 12/16/2017   HCT 39.7 12/16/2017   PLT 257 12/16/2017   LYMPHOPCT 36 10/03/2016   MONOPCT 13 10/03/2016   EOSPCT 0 10/03/2016   BASOPCT 0 10/03/2016    CMP:  Lab Results  Component Value Date   NA 144 12/18/2017   K 3.2 (L) 12/18/2017   CL 109 12/18/2017   CO2 28 12/18/2017   BUN 7 12/18/2017   CREATININE 0.77 12/18/2017   PROT 7.1 12/16/2017   ALBUMIN 3.9 12/16/2017   BILITOT 0.5 12/16/2017   ALKPHOS 88 12/16/2017   AST 22 12/16/2017   ALT 19 12/16/2017   Total Discharge time is about 33 minutes  Roxan Hockey M.D on 12/18/2017 at 4:37 PM  [facility]   Office  [phone]  Voice Recognition Viviann Spare dictation system was used to create this note, attempts have been made to correct errors. Please contact the author with questions and/or clarifications.

## 2017-12-18 NOTE — Progress Notes (Signed)
Subjective:  Patient seen in nuclear medicine department complains of vague chest pain without associated symptoms states overall feels better  Objective:  Vital Signs in the last 24 hours: Temp:  [98 F (36.7 C)-98.5 F (36.9 C)] 98.3 F (36.8 C) (03/22 0507) Pulse Rate:  [58-90] 80 (03/22 1057) Resp:  [16-20] 16 (03/22 1057) BP: (115-180)/(76-102) 150/86 (03/22 1057) SpO2:  [98 %-100 %] 98 % (03/22 0507)  Intake/Output from previous day: 03/21 0701 - 03/22 0700 In: 580 [P.O.:480; IV Piggyback:100] Out: 1850 [Urine:1850] Intake/Output from this shift: No intake/output data recorded.  Physical Exam: Neck: no adenopathy, no carotid bruit, no JVD and supple, symmetrical, trachea midline Lungs: clear to auscultation bilaterally Heart: regular rate and rhythm, regularly irregular rhythm and soft systolic murmur noted Abdomen: soft, non-tender; bowel sounds normal; no masses,  no organomegaly Extremities: extremities normal, atraumatic, no cyanosis or edema  Lab Results: Recent Labs    12/16/17 1535  WBC 7.8  HGB 13.3  PLT 257   Recent Labs    12/17/17 0913 12/18/17 0539  NA 144 144  K 2.9* 3.2*  CL 106 109  CO2 28 28  GLUCOSE 86 86  BUN 7 7  CREATININE 0.75 0.77   Recent Labs    12/17/17 0913 12/17/17 1523  TROPONINI 0.03* 0.03*   Hepatic Function Panel Recent Labs    12/16/17 1535  PROT 7.1  ALBUMIN 3.9  AST 22  ALT 19  ALKPHOS 88  BILITOT 0.5   Recent Labs    12/18/17 0539  CHOL 101   No results for input(s): PROTIME in the last 72 hours.  Imaging: Imaging results have been reviewed and Dg Chest Port 1 View  Result Date: 12/16/2017 CLINICAL DATA:  Dyspnea.  Abdominal pain.  Back pain. EXAM: PORTABLE CHEST 1 VIEW COMPARISON:  Chest radiograph 10/03/2016 FINDINGS: The cardiomediastinal contours are normal. The lungs are clear. Pulmonary vasculature is normal. No consolidation, pleural effusion, or pneumothorax. No acute osseous abnormalities are  seen. Minimal proliferative change at the right acromioclavicular joint. IMPRESSION: No acute abnormality. Electronically Signed   By: Jeb Levering M.D.   On: 12/16/2017 21:32   Ct Angio Chest/abd/pel For Dissection W And/or W/wo  Result Date: 12/16/2017 CLINICAL DATA:  Abdominal pain, back pain and hypertension EXAM: CT ANGIOGRAPHY CHEST, ABDOMEN AND PELVIS TECHNIQUE: Multidetector CT imaging through the chest, abdomen and pelvis was performed using the standard protocol during bolus administration of intravenous contrast. Multiplanar reconstructed images and MIPs were obtained and reviewed to evaluate the vascular anatomy. CONTRAST:  19mL ISOVUE-370 IOPAMIDOL (ISOVUE-370) INJECTION 76% COMPARISON:  None. FINDINGS: CTA CHEST FINDINGS Cardiovascular: The heart size is normal. There is nopericardial effusion. The course and caliber of the thoracic aorta are normal. There is no aortic atherosclerotic calcification. Precontrast images show no aortic intramural hematoma. There is no blood pool, dissection or penetrating ulcer demonstrated on arterial phase postcontrast imaging. Normal variant aortic arch branching pattern with the brachiocephalic and left common carotid arteries sharing a common origin. The proximal arch vessels are widely patent. The central pulmonary arteries are normal. Mediastinum/Nodes: No mediastinal, hilar or axillary lymphadenopathy. The visualized thyroid and thoracic esophageal course are unremarkable. Lungs/Pleura: No pulmonary nodules or masses. No pleural effusion or pneumothorax. No focal airspace consolidation. No focal pleural abnormality. Musculoskeletal: No chest wall abnormality. No acute osseous findings. Review of the MIP images confirms the above findings. CTA ABDOMEN AND PELVIS FINDINGS VASCULAR Aorta: Normal caliber aorta without aneurysm, dissection, vasculitis or hemodynamically significant stenosis. There  is no aortic atherosclerosis. Celiac: No aneurysm, dissection  or hemodynamically significant stenosis. Normal branching pattern. SMA: Widely patent without dissection or stenosis. Renals: Single renal arteries bilaterally. No aneurysm, dissection, stenosis or evidence of fibromuscular dysplasia. IMA: Patent without abnormality. Inflow: Minimal atherosclerotic calcification without stenosis or other abnormality. Veins: Normal course and caliber of the major veins. Assessment is otherwise limited by the arterial dominant contrast phase. Review of the MIP images confirms the above findings. NON-VASCULAR Hepatobiliary: Normal hepatic contours and density. No visible biliary dilatation. Status post cholecystectomy. Pancreas: Normal contours without ductal dilatation. No peripancreatic fluid collection. Spleen: Normal arterial phase splenic enhancement pattern. Adrenals/Urinary Tract: --Adrenal glands: Intermediate attenuation right adrenal nodule measuring 1.4 cm. --Right kidney/ureter: Right upper pole renal cyst measures 2.9 cm. --Left kidney/ureter: No hydronephrosis or perinephric stranding. No nephrolithiasis. No obstructing ureteral stones. --Urinary bladder: Unremarkable. Stomach/Bowel: --Stomach/Duodenum: Status post gastric bypass. --Small bowel: No dilatation or inflammation. --Colon: No focal abnormality. --Appendix: Normal. Lymphatic:  No abdominal or pelvic lymphadenopathy. Reproductive: Status post hysterectomy. No adnexal mass. Musculoskeletal. No bony spinal canal stenosis or focal osseous abnormality. Other: None. Review of the MIP images confirms the above findings. IMPRESSION: No acute aortic syndrome or other acute abnormality. Electronically Signed   By: Ulyses Jarred M.D.   On: 12/16/2017 22:15    Cardiac Studies:  Assessment/Plan:  Recurrent chest pain MI ruled out Minimally elevated troponin I secondary to demand ischemia Uncontrolled hypertension Hyperlipidemia Hypokalemia Obstructive sleep apnea History of morbid obesity status post gastric  bypass Fibromyalgia History of CVA in the remote past Depression Possible UTI Plan And Aldactone 25 mg daily Check nuclear stress test results if no evidence of ischemia okay to discharge from cardiac point of view. May need workup for secondary hypertension if continues to have hypokalemia as outpatient   LOS: 1 day    Charolette Forward 12/18/2017, 11:27 AM

## 2017-12-21 LAB — CULTURE, BLOOD (ROUTINE X 2)
Culture: NO GROWTH
Culture: NO GROWTH
Special Requests: ADEQUATE
Special Requests: ADEQUATE

## 2018-01-27 ENCOUNTER — Emergency Department (HOSPITAL_COMMUNITY)
Admission: EM | Admit: 2018-01-27 | Discharge: 2018-01-28 | Disposition: A | Discharge status: Home / Self Care | Payer: Medicare Other | Attending: Emergency Medicine | Admitting: Emergency Medicine

## 2018-01-27 DIAGNOSIS — M5441 Lumbago with sciatica, right side: Secondary | ICD-10-CM | POA: Diagnosis not present

## 2018-01-27 DIAGNOSIS — I10 Essential (primary) hypertension: Secondary | ICD-10-CM | POA: Diagnosis not present

## 2018-01-27 DIAGNOSIS — Z79899 Other long term (current) drug therapy: Secondary | ICD-10-CM | POA: Diagnosis not present

## 2018-01-27 DIAGNOSIS — Z7982 Long term (current) use of aspirin: Secondary | ICD-10-CM | POA: Insufficient documentation

## 2018-01-27 DIAGNOSIS — M545 Low back pain: Secondary | ICD-10-CM | POA: Diagnosis present

## 2018-01-27 MED ORDER — METHOCARBAMOL 500 MG PO TABS
500.0000 mg | ORAL_TABLET | Freq: Once | ORAL | Status: AC
  Administered 2018-01-27: 500 mg via ORAL
  Filled 2018-01-27: qty 1

## 2018-01-27 MED ORDER — HYDROCODONE-ACETAMINOPHEN 5-325 MG PO TABS
1.0000 | ORAL_TABLET | Freq: Once | ORAL | Status: AC
  Administered 2018-01-27: 1 via ORAL
  Filled 2018-01-27: qty 1

## 2018-01-27 MED ORDER — KETOROLAC TROMETHAMINE 15 MG/ML IJ SOLN
15.0000 mg | Freq: Once | INTRAMUSCULAR | Status: AC
  Administered 2018-01-27: 15 mg via INTRAMUSCULAR
  Filled 2018-01-27: qty 1

## 2018-01-27 MED ORDER — CYCLOBENZAPRINE HCL 5 MG PO TABS: 5.0000 mg | ORAL_TABLET | Freq: Two times a day (BID) | ORAL | 0 refills | Status: AC | PRN

## 2018-01-27 MED ORDER — PREDNISONE 20 MG PO TABS: 40.0000 mg | ORAL_TABLET | Freq: Every day | ORAL | 0 refills | Status: AC

## 2018-01-27 MED ORDER — PREDNISONE 20 MG PO TABS
40.0000 mg | ORAL_TABLET | Freq: Once | ORAL | Status: AC
  Administered 2018-01-27: 40 mg via ORAL
  Filled 2018-01-27: qty 2

## 2018-01-27 NOTE — ED Notes (Signed)
Pt encouraged to provide UA sample. Does not need to go at this time.

## 2018-01-27 NOTE — ED Notes (Signed)
Patient was asked to void; was only able to give a couple of drops (cloudy). Patient given a cup of water and asked to retry shortly.

## 2018-01-27 NOTE — ED Triage Notes (Signed)
Patient c/o right sided back pain. Has chronic back pain and fibromyalgia. States taking hydrocodone, lyrica, cymbalta with no relief.Denies injury.

## 2018-01-27 NOTE — ED Notes (Signed)
See provider assessment 

## 2018-01-27 NOTE — ED Provider Notes (Signed)
Patient placed in Quick Look pathway, seen and evaluated   Chief Complaint: right sided lower back pain  HPI: Patient with hx pertinent for chronic back pain, fibromyalgia presents with acute on chronic right sided lower back pain radiating down the right lower extremity. Denies fever, chills, dysuria, hematuria. She has taken her medications earlier today with modest relief.  ROS: no loss of bowel or bladder function, hx of malignancy or iv drug use, no weakness or numbness.  Physical Exam:   Gen: No distress  Neuro: Awake and Alert  Skin: Warm    Focused Exam: strong odor in the room concerning for uti 5/5 strength in LE bilaterally. ttp at right lower musculature and buttocks.   Initiation of care has begun. The patient has been counseled on the process, plan, and necessity for staying for the completion/evaluation, and the remainder of the medical screening examination    Dossie Der 01/27/18 2033    Gareth Morgan, MD 01/30/18 1221

## 2018-01-27 NOTE — Discharge Instructions (Addendum)
Please see the information and instructions below regarding your visit.  Your diagnoses today include:  1. Acute right-sided low back pain with right-sided sciatica   2. Elevated blood pressure reading in office with diagnosis of hypertension    About diagnosis. Most episodes of acute low back pain are self-limited. Your exam was reassuring today that the source of your pain is not affecting the spinal cord and nerves that originate in the spinal cord.   If you have a history of disc herniation or arthritis in your spine, the nerves exiting the spine on one side get inflamed. This can cause severe pain. We call this radiculopathy. We do not always know what causes the sudden inflammation.  Tests performed today include: See side panel of your discharge paperwork for testing performed today. Vital signs are listed at the bottom of these instructions.   Medications prescribed:    Take any prescribed medications only as prescribed, and any over the counter medications only as directed on the packaging.  You are prescribed prednisone, a steroid. This is a medication to help reduce inflammation in the spine.  Common side effects include upset stomach/nausea. You may take this medicine with food if this occurs. Other side effects include restlessness, difficulty sleeping, and increased sweating. Call your healthcare provider if these do not resolve after finishing the medication.  This medicine may increase your blood sugar so additional careful monitoring is needed of blood sugar if you have diabetes. Call your healthcare provider for any signs/symtpoms of high blood sugar such as confusion, feeling sleepy, more thirst, more hunger, passing urine more often, flushing, fast breathing, or breath that smells like fruit.  You are prescribed Robaxin, a muscle relaxant. Some common side effects of this medication include:  Feeling sleepy.  Dizziness. Take care upon going from a seated to a standing  position.  Dry mouth.  Feeling tired or weak.  Hard stools (constipation).  Upset stomach. These are not all of the side effects that may occur. If you have questions about side effects, call your doctor. Call your primary care provider for medical advice about side effects.  This medication can be sedating. Only take this medication as needed. Please do not combine with alcohol. Do not drive or operate machinery while taking this medication.   This medication can interact with some other medications. Make sure to tell any provider you are taking this medication before they prescribe you a new medication.   You are prescribed Lidoderm patches.  These are placed on the area of discomfort every 24 hours.  Home care instructions:   Low back pain gets worse the longer you stay stationary. Please keep moving and walking as tolerated. There are exercises included in this packet to perform as tolerated for your low back pain.   Apply heat to the areas that are painful. Avoid twisting or bending your trunk to lift something. Do not lift anything above 25 lbs while recovering from this flare of low back pain.  Please follow any educational materials contained in this packet.   Please purchase salon pas patches.  These are over-the-counter.  Follow-up instructions: Please follow-up with your primary care provider in 3 days for further evaluation of your symptoms if they are not completely improved.   Return instructions:  Please return to the Emergency Department if you experience worsening symptoms.  Please return for any fever or chills in the setting of your back pain, weakness in the muscles of the legs, numbness in  your legs and feet that is new or changing, numbness in the area where you wipe, retention of your urine, loss of bowel or bladder control, or problems with walking. Please return if you have any other emergent concerns.  Additional Information:   Your vital signs today  were: BP (!) 171/103 (BP Location: Right Arm) Comment: Has not taken her night dose of BP meds   Pulse 81    Temp 97.6 F (36.4 C)    Resp 20    Ht 4\' 11"  (1.499 m)    Wt 78 kg (172 lb)    SpO2 100%    BMI 34.74 kg/m  If your blood pressure (BP) was elevated on multiple readings during this visit above 130 for the top number or above 80 for the bottom number, please have this repeated by your primary care provider within one month. --------------  Thank you for allowing [place] to participate in your care today.

## 2018-01-27 NOTE — ED Provider Notes (Addendum)
[facility] EMERGENCY DEPARTMENT Provider Note   CSN: [id number] Arrival date & time: 01/27/18  2012     History   Chief Complaint Chief Complaint  Patient presents with  . Back Pain    HPI Jodi Bowman is a 57 y.o. female.  HPI   Patient is a 57 year old female with a history of CVA (residual facial weakness), hypertension, fibromyalgia, low back pain presenting for right-sided low back pain rating down her right leg.  Patient reports that the pain began gradually this morning around 9:30 AM and worsened throughout the day.  Patient reports she has a history of identical pain, but it occurred a couple years ago when she last required evaluation in the emergency department for this.  Patient reports that she is on pain regimen of hydrocodone, Cymbalta, and Lyrica, but she had persistent pain today.  No recent trauma or heavy lifting.  No falls.  Patient denies any fevers, chills, weakness in lower extremities, numbness in lower extremities, saddle anesthesia, loss of bowel or bladder control, overflow incontinence, IVDU, or history of cancer.  Patient denies any dysuria, urgency, frequency, flank pain, vaginal discharge, or abdominal pain.  Patient has history of hysterectomy.  Past Medical History:  Diagnosis Date  . Arthritis   . Calculus of gallbladder without mention of cholecystitis or obstruction   . Dizziness   . Fibromyalgia   . Goiter   . Hypercholesteremia   . Hypertension   . Lower back pain   . Migraine   . Nontoxic uninodular goiter    sees dr vollmer at [facility]  . Obesity   . Sleep apnea    STOPBANG=5  . Stroke [facility]) 2000    Patient Active Problem List   Diagnosis Date Noted  . Chest pain, rule out acute myocardial infarction 12/17/2017  . Hypokalemia 12/17/2017  . Acute lower UTI 12/17/2017  . Vertigo 04/30/2017  . Small vessel disease, cerebrovascular 04/30/2017  . Chronic low back pain 08/01/2015  . Right hip pain 08/01/2015    . Abnormality of gait 08/01/2015  . Left leg weakness   . Low back pain with radiation   . Syncope 07/30/2014  . Atypical chest pain 03/12/2014  . Lap chole [facility] April 2013 01/29/2012  . Gallstones 12/04/2011  . Thyroid nodule-non neoplastic goiter by needle aspiration 12/04/2011  . GLUCOSE INTOLERANCE 03/12/2010  . DYSLIPIDEMIA 03/12/2010  . Chronic migraine 03/12/2010  . Carotid stenosis 03/12/2010  . CEREBROVASCULAR ACCIDENT 03/12/2010  . LIPOMA 01/29/2010  . HEADACHE 01/29/2010  . ANKLE INJURY, RIGHT 04/12/2009  . PHARYNGITIS 03/21/2009  . Backache 03/01/2009  . ALLERGIC RHINITIS 03/17/2007  . LOW BACK PAIN 03/17/2007  . Essential hypertension 01/01/2007  . ANXIETY STATE NOS 09/11/2005    Past Surgical History:  Procedure Laterality Date  . ABDOMINAL HYSTERECTOMY    . CESAREAN SECTION  yrs ago   done x 2  . CHOLECYSTECTOMY  01/05/2012   Procedure: LAPAROSCOPIC CHOLECYSTECTOMY WITH INTRAOPERATIVE CHOLANGIOGRAM;  Surgeon: Pedro Earls, MD;  Location: [facility] ORS;  Service: General;  Laterality: N/A;  . COLONOSCOPY  10/08/2012   Procedure: COLONOSCOPY;  Surgeon: Beryle Beams, MD;  Location: [facility] ENDOSCOPY;  Service: Endoscopy;  Laterality: N/A;  . KNEE ARTHROSCOPY  one 1995 and 1 in 1997   both knees done  . surgery for endometriosis  yrs ago  . thryoid biopsy  December 01, 2011    at [facility]     OB History   None  Home Medications    Prior to Admission medications   Medication Sig Start Date End Date Taking? Authorizing Provider  acetaminophen (TYLENOL) 500 MG tablet Take 1,000 mg by mouth every 6 (six) hours as needed for moderate pain or headache.    [provider]  amLODipine (NORVASC) 5 MG tablet Take 1 tablet (5 mg total) by mouth daily. 12/19/17   Roxan Hockey, MD  aspirin EC 81 MG tablet Take 1 tablet (81 mg total) by mouth daily. With food 12/18/17 12/18/18  Roxan Hockey, MD  Cholecalciferol 50000 units capsule Take 50,000 Units by mouth every  Friday.    [provider]  DULoxetine (CYMBALTA) 60 MG capsule Take 60 mg by mouth daily. 03/16/17   [provider]  EMBEDA 30-1.2 MG CPCR TAKE 1 CAPSULE BY MOUTH EVERY 12 HOURS 11/19/17   [provider]  fluticasone (FLONASE) 50 MCG/ACT nasal spray SPRAY 2 SPRAYS INTO EACH NOSTRIL EVERY DAY PRN ALLERGIES 11/08/17   [provider]  folic acid (FOLVITE) 1 MG tablet Take 1 mg by mouth daily. 10/14/17   [provider]  loratadine (CLARITIN) 10 MG tablet Take 10 mg by mouth daily.     [provider]  LYRICA 25 MG capsule TAKE 1 CAPSULE BY MOUTH THREE TIMES A DAY (INSURANCE DENIED PRIOR AUTH) 11/11/17   [provider]  metoprolol succinate (TOPROL-XL) 50 MG 24 hr tablet TAKE 1 TABLET ER 24HR DAILY ORAL 12/18/17   Roxan Hockey, MD  nitroGLYCERIN (NITROSTAT) 0.4 MG SL tablet Place 1 tablet (0.4 mg total) under the tongue every 5 (five) minutes x 3 doses as needed for chest pain. Patient taking differently: Place 0.4 mg under the tongue every 5 (five) minutes as needed for chest pain. Maximum 3 doses 03/13/14   Charolette Forward, MD  pantoprazole (PROTONIX) 40 MG tablet Take 40 mg by mouth 2 (two) times daily.     [provider]  potassium chloride SA (K-DUR,KLOR-CON) 20 MEQ tablet Take 1 tablet (20 mEq total) by mouth 2 (two) times daily. 53/66/44   Delora Fuel, MD  spironolactone (ALDACTONE) 25 MG tablet Take 1 tablet (25 mg total) by mouth daily. 12/18/17 12/18/18  Roxan Hockey, MD  metoprolol (LOPRESSOR) 50 MG tablet Take 50 mg by mouth 2 (two) times daily.    12/04/11  [provider]  simvastatin (ZOCOR) 20 MG tablet Take 20 mg by mouth at bedtime.    12/04/11  [provider]    Family History Family History  Problem Relation Age of Onset  . Diabetes Mother   . Hypertension Mother   . Transient ischemic attack Mother   . Seizures Mother   . Dementia Mother   . Other Father        MVA  . Cancer Brother         colon and lung  . Cancer Maternal Grandmother        colon    Social History Social History   Tobacco Use  . Smoking status: Never Smoker  . Smokeless tobacco: Never Used  Substance Use Topics  . Alcohol use: Yes    Alcohol/week: 0.0 oz    Comment: Occasionally  . Drug use: No     Allergies   Shrimp [shellfish allergy] and Naproxen   Review of Systems Review of Systems  Constitutional: Negative for chills and fever.  Gastrointestinal: Negative for abdominal pain, nausea and vomiting.  Genitourinary: Negative for dysuria, frequency, pelvic pain and urgency.  Musculoskeletal: Positive  for back pain and myalgias.  Neurological: Negative for weakness and numbness.     Physical Exam Updated Vital Signs BP (!) 171/103 (BP Location: Right Arm) Comment: Has not taken her night dose of BP meds  Pulse 81   Temp 97.6 F (36.4 C)   Resp 20   Ht 4\' 11"  (1.499 m)   Wt 78 kg (172 lb)   SpO2 100%   BMI 34.74 kg/m   Physical Exam  Constitutional: She appears well-developed and well-nourished. No distress.  HENT:  Head: Normocephalic and atraumatic.  Mouth/Throat: Oropharynx is clear and moist.  Eyes: Pupils are equal, round, and reactive to light. Conjunctivae and EOM are normal.  Neck: Normal range of motion. Neck supple.  Cardiovascular: Normal rate, regular rhythm, S1 normal and S2 normal.  No murmur heard. Pulmonary/Chest: Effort normal and breath sounds normal. She has no wheezes. She has no rales.  Abdominal: Soft. She exhibits no distension. There is no tenderness. There is no guarding.  Well-healed, midline laparotomy scar.   Genitourinary:  Genitourinary Comments: Examination performed with RN chaperone, Lexine Baton, present.  Patient exhibits full sensation surrounding rectum.  Patient exhibits normal rectal tone.  Musculoskeletal: Normal range of motion. She exhibits no edema or deformity.  Spine Exam: Inspection/Palpation: Palpation performed with RN  chaperone present.  No midline tenderness of cervical, thoracic, or lumbar spine.  No nodules or bulges of her coccyx.  Patient exhibits diffuse tenderness from sacroiliac joint extending laterally on the right.  No left-sided paraspinal tenderness.  Patient exhibits pain to palpation overlying sciatic notch down the posterior leg up to but not including popliteal region. Strength: 5/5 throughout LE bilaterally (hip flexion/extension, adduction/abduction; knee flexion/extension; foot dorsiflexion/plantarflexion, inversion/eversion; great toe inversion) Sensation: Intact to light touch in proximal and distal LE bilaterally Intact, 2+ DP pulses equal bilaterally. Reflexes: 2+ quadriceps and achilles reflexes Patient exhibits antalgic gait favoring right, but no evidence of muscular weakness or foot drop.  Lymphadenopathy:    She has no cervical adenopathy.  Neurological: She is alert.  Cranial nerves grossly intact. Patient moves extremities symmetrically and with good coordination.  Skin: Skin is warm and dry. No rash noted. No erythema.  Psychiatric: She has a normal mood and affect. Her behavior is normal. Judgment and thought content normal.  Nursing note and vitals reviewed.    ED Treatments / Results  Labs (all labs ordered are listed, but only abnormal results are displayed) Labs Reviewed  URINALYSIS, ROUTINE W REFLEX MICROSCOPIC    EKG None  Radiology No results found.  Procedures Procedures (including critical care time)  Medications Ordered in ED Medications  HYDROcodone-acetaminophen (NORCO/VICODIN) 5-325 MG per tablet 1 tablet (1 tablet Oral Given 01/27/18 2033)  ketorolac (TORADOL) 15 MG/ML injection 15 mg (15 mg Intramuscular Given 01/27/18 2258)  methocarbamol (ROBAXIN) tablet 500 mg (500 mg Oral Given 01/27/18 2258)  predniSONE (DELTASONE) tablet 40 mg (40 mg Oral Given 01/27/18 2258)     Initial Impression / Assessment and Plan / ED Course  I have reviewed the triage  vital signs and the nursing notes.  Pertinent labs & imaging results that were available during my care of the patient were reviewed by me and considered in my medical decision making (see chart for details).  Clinical Course as of Jan 28 2351  Wed Jan 27, 2018  2351 Bladder scan performed at bedside with RN chaperone, Lexine Baton.  Patient has between 57 and 90 mL of urine in the bladder.  Rectal examination performed and  patient exhibits normal rectal tone.   [AM]    Clinical Course User Index [AM] Albesa Seen, PA-C    57 year old female with history of low back pain presenting for acute exacerbation of what patient describes a similar pain from prior.  Patient denies any concerning symptoms suggestive of cauda equina, epidural abscess requiring urgent imaging at this time such as loss of sensation in the lower extremities, lower extremity weakness, loss of bowel or bladder control, saddle anesthesia, urinary retention, fever/chills, IVDU.  Patient initially attempting to give urine specimen ordered in triage, and tried x3 without success.  Due to concerns for urinary retention, bladder scanning performed at bedside, which did not reveal significant retention.  Additionally, rectal examination not concerning for loss of rectal tone.  Exam demonstrated no  weakness on exam today. No preceding injury or trauma to suggest acute fracture. Doubt pelvic or urinary pathology for patient's acute back pain, as patient denies urinary symptoms, has no CVA tenderness, history/pain not consistent with nephrolithiasis, has no vaginal discharge, and has history of hysterectomy. Doubt aortic or renal aneurysm, as patient has intact distal pulses, equal bilaterally, and no abdominal tenderness or pulsatile mass.   Patient had symptomatic improvement with report of pain 5 out of 10 after Toradol, Robaxin, and prednisone.    Due to labile hypertension, patient given one dose of Toradol in ED, but was not prescribed  NSAIDs.  Patient prescribed 5-day course of prednisone.  Patient is not diabetic. Discussed close follow-up with primary care provider during prednisone course, and patient reports she likely will be able to see her primary care provider within 3 days.  Patient given strict return precautions for any symptoms indicating worsening neurologic function in the lower extremities, saddle anesthesia, overflow incontinence, loss of bowel control, or fever or chills.  This is a supervised visit with Dr. Cyril Mourning Ward. Evaluation, management, and discharge planning discussed with this attending physician.  Final Clinical Impressions(s) / ED Diagnoses   Final diagnoses:  Acute right-sided low back pain with right-sided sciatica  Elevated blood pressure reading in office with diagnosis of hypertension    ED Discharge Orders        Ordered    cyclobenzaprine (FLEXERIL) 5 MG tablet  2 times daily PRN     01/27/18 2356    predniSONE (DELTASONE) 20 MG tablet  Daily     01/27/18 2356       Albesa Seen, PA-C 01/28/18 0309    Ward, Delice Bison, DO 01/28/18 0310    Langston Masker B, PA-C 01/28/18 1701    Ward, Delice Bison, DO 01/28/18 2311

## 2018-02-03 IMAGING — MR MR HEAD W/O CM
8 series · 48 of 48 positions shown · non-contrast
Comparison: MR brain 06/08/2015 most recent. Also 10/12/2013 and
02/12/2010

CLINICAL DATA: Vertigo with loss of balance.

EXAM:
MRI HEAD WITHOUT CONTRAST
TECHNIQUE: Multiplanar, multiecho pulse sequences of the brain and surrounding
structures were obtained without intravenous contrast.

[Series 2: T1 · sagittal · 5.0mm · 0.45mm/px · 2 of 21 slices shown]
[im 1/21]
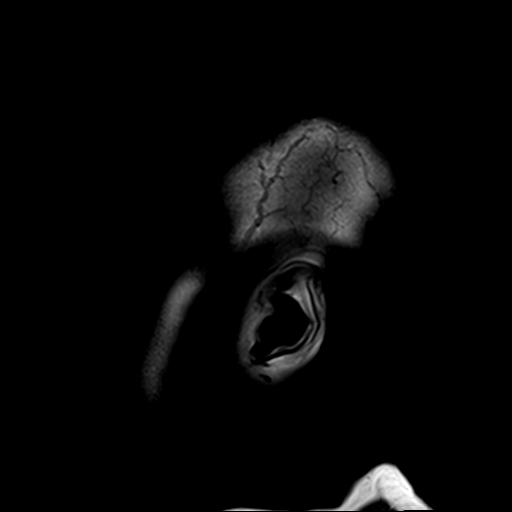
[im 21/21]
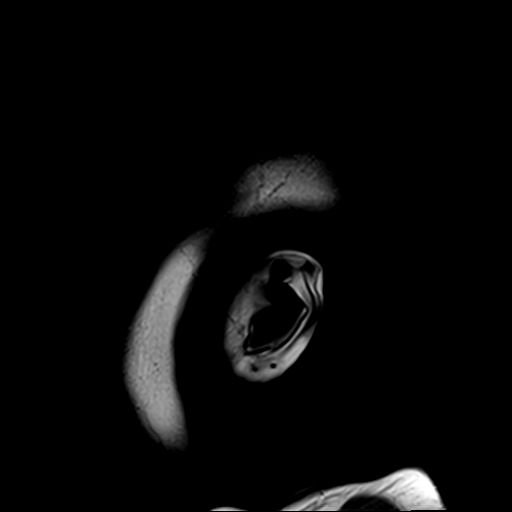

[Series 3: DWI · axial · 3.0mm · 1.80mm/px · z∈[-0,+147]mm · 11 of 98 slices shown (1 of 2)]
[im 1/98]
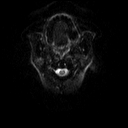
[im 10/98]
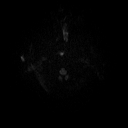
[im 20/98]
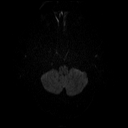
[im 30/98]
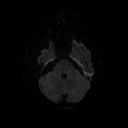
[im 39/98]
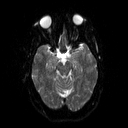
[im 49/98]
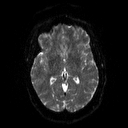
[im 59/98]
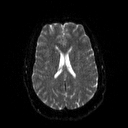
[im 68/98]
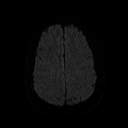
[im 78/98]
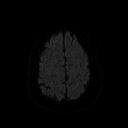
[im 88/98]
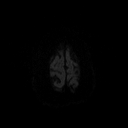
[im 98/98]
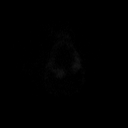

[Series 4: DWI · axial · 3.0mm · 1.80mm/px · z∈[-0,+147]mm · 6 of 49 slices shown (2 of 2)]
[im 1/49]
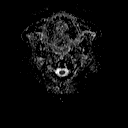
[im 10/49]
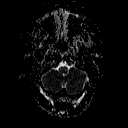
[im 20/49]
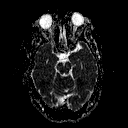
[im 29/49]
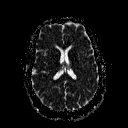
[im 39/49]
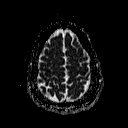
[im 49/49]
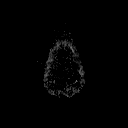

[Series 5: T2 · axial · 5.0mm · 0.51mm/px · z∈[+2,+149]mm · 3 of 22 slices shown (1 of 2)]
[im 1/22]
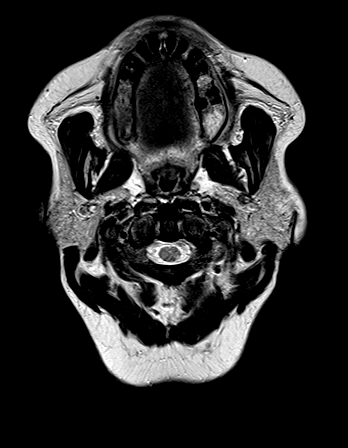
[im 11/22]
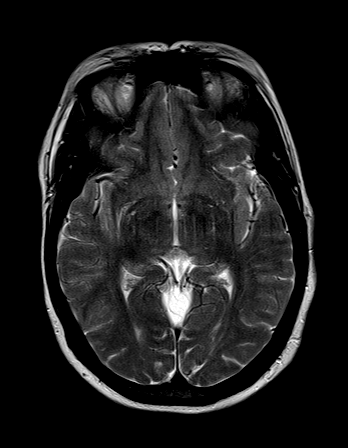
[im 22/22]
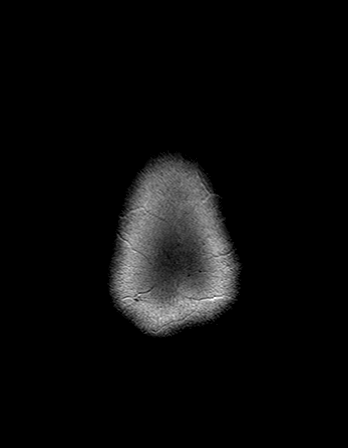

[Series 6: FLAIR · axial · 3.0mm · 0.45mm/px · z∈[+10,+147]mm · 3 of 24 slices shown]
[im 1/24]
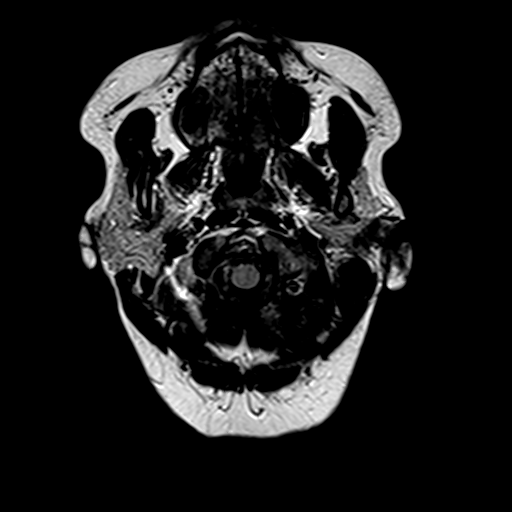
[im 12/24]
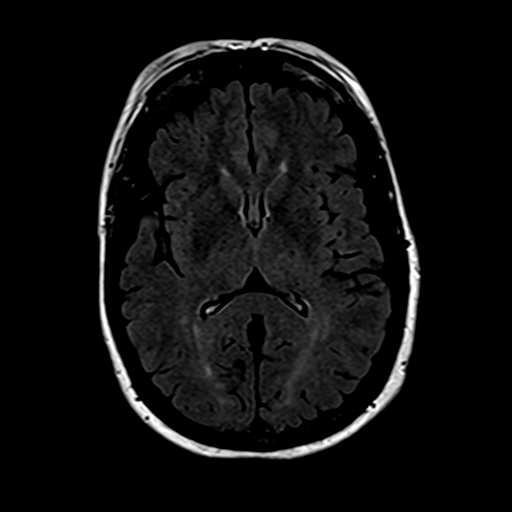
[im 24/24]
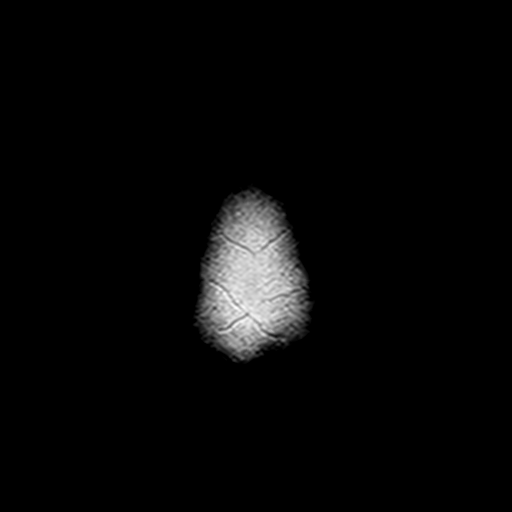

[Series 8: swi_images · axial · 5.0mm · 0.90mm/px · z∈[-1,+143]mm · 3 of 30 slices shown]
[im 1/30]
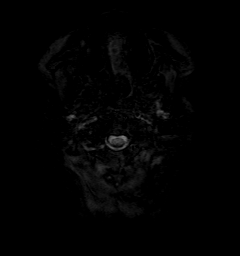
[im 15/30]
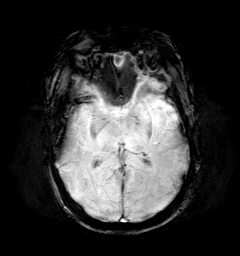
[im 30/30]
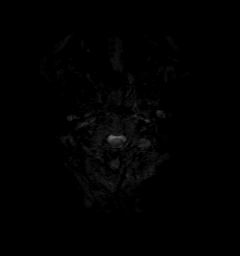

[Series 9: t1_mpr_tra · axial · 1.0mm · 0.72mm/px · z∈[+5,+148]mm · 17 of 144 slices shown]
[im 1/144]
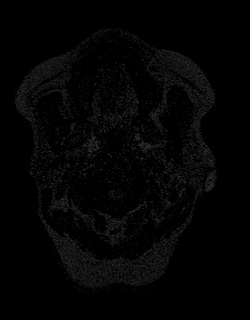
[im 9/144]
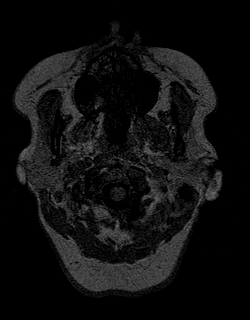
[im 18/144]
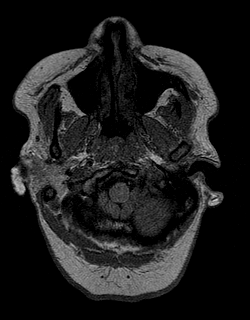
[im 27/144]
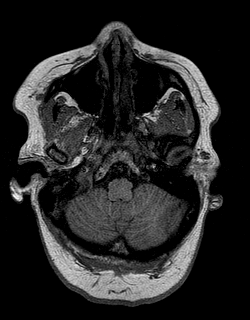
[im 36/144]
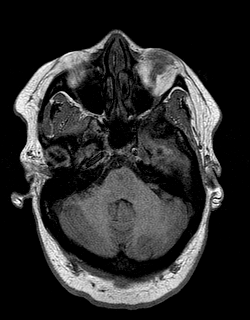
[im 45/144]
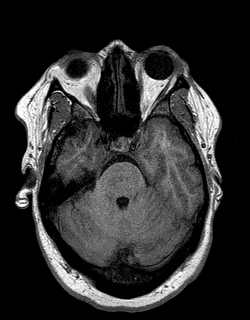
[im 54/144]
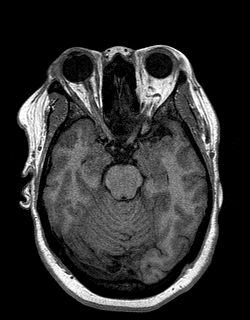
[im 63/144]
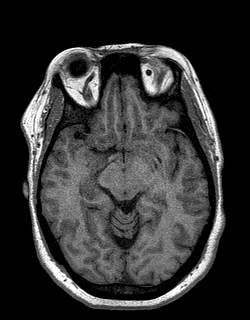
[im 72/144]
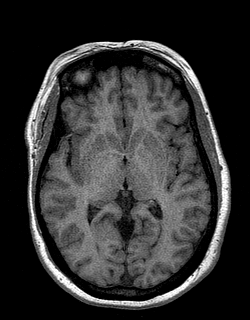
[im 81/144]
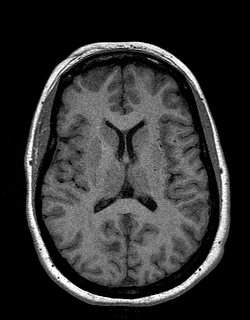
[im 90/144]
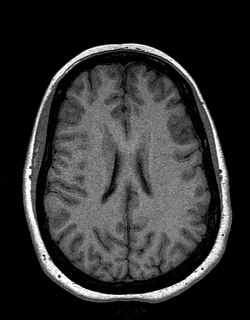
[im 99/144]
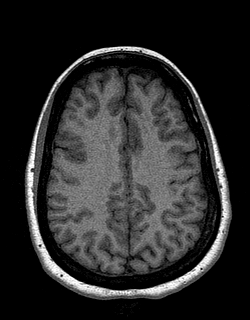
[im 108/144]
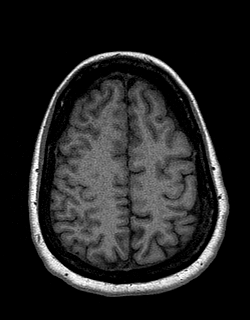
[im 117/144]
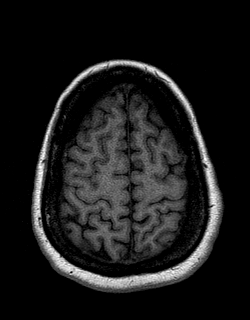
[im 126/144]
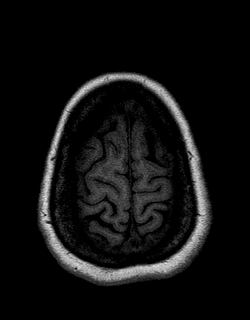
[im 135/144]
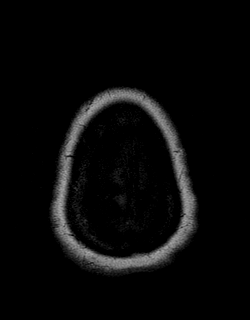
[im 144/144]
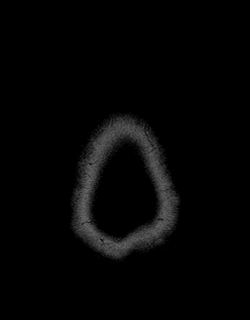

[Series 10: T2 · coronal · 5.0mm · 0.45mm/px · 3 of 27 slices shown (2 of 2)]
[im 1/27]
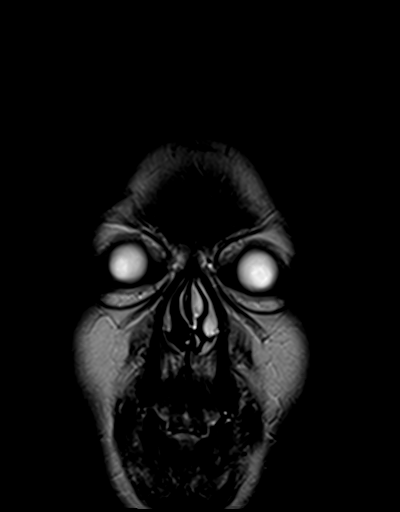
[im 14/27]
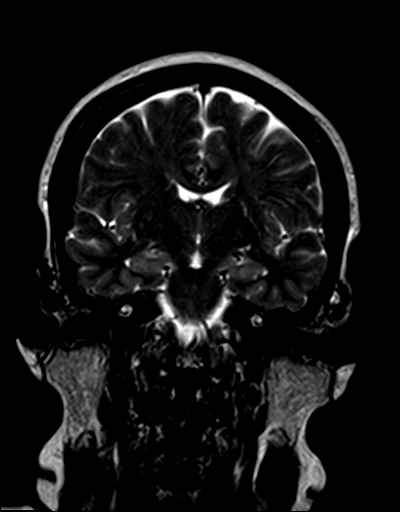
[im 27/27]
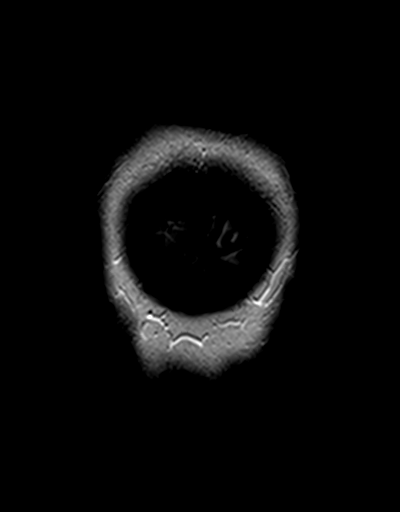

[48 of 48 positions shown; findings below may reference images not displayed]

FINDINGS: Brain: No evidence for acute infarction, hemorrhage, mass lesion,
hydrocephalus, or extra-axial fluid. Normal for age cerebral volume.
Mild subcortical and periventricular T2 and FLAIR hyperintensities,
also affecting the pons, likely chronic microvascular ischemic
change.

Vascular: Flow voids are maintained throughout the carotid, basilar,
and vertebral arteries. There are no areas of chronic hemorrhage.

Skull and upper cervical spine: Unremarkable visualized calvarium,
skullbase, and cervical vertebrae. Pituitary, pineal, cerebellar
tonsils unremarkable. No upper cervical cord lesions.

Sinuses/Orbits: No orbital masses or proptosis. Globes appear
symmetric. Sinuses appear well aerated, without evidence for
air-fluid level. BILATERAL cataract extraction.

Other: No nasopharyngeal pathology or mastoid fluid. Scalp and other
visualized extracranial soft tissues grossly unremarkable.

Compared with priors, there is slight progression of chronic
microvascular ischemic change.
IMPRESSION: Mild progression of small vessel disease from 4406. No acute
intracranial findings.

It is unclear if the suspected chronic microvascular ischemic change
affecting the pons could be contributory to the patient's symptoms.

## 2018-03-06 ENCOUNTER — Inpatient Hospital Stay (HOSPITAL_COMMUNITY)
Admission: EM | Admit: 2018-03-06 | Discharge: 2018-03-10 | DRG: 330 | Disposition: A | Discharge status: Home / Self Care | Payer: Medicare Other | Attending: Surgery | Admitting: Surgery

## 2018-03-06 ENCOUNTER — Other Ambulatory Visit: Payer: Self-pay

## 2018-03-06 ENCOUNTER — Emergency Department (HOSPITAL_COMMUNITY): Payer: Medicare Other | Admitting: Anesthesiology

## 2018-03-06 ENCOUNTER — Encounter (HOSPITAL_COMMUNITY): Payer: Self-pay | Admitting: Emergency Medicine

## 2018-03-06 ENCOUNTER — Encounter (HOSPITAL_COMMUNITY): Admission: EM | Disposition: A | Discharge status: Home / Self Care | Payer: Self-pay | Source: Home / Self Care

## 2018-03-06 ENCOUNTER — Emergency Department (HOSPITAL_COMMUNITY): Payer: Medicare Other

## 2018-03-06 DIAGNOSIS — I10 Essential (primary) hypertension: Secondary | ICD-10-CM | POA: Diagnosis present

## 2018-03-06 DIAGNOSIS — Z8673 Personal history of transient ischemic attack (TIA), and cerebral infarction without residual deficits: Secondary | ICD-10-CM

## 2018-03-06 DIAGNOSIS — R188 Other ascites: Secondary | ICD-10-CM | POA: Diagnosis present

## 2018-03-06 DIAGNOSIS — G43909 Migraine, unspecified, not intractable, without status migrainosus: Secondary | ICD-10-CM | POA: Diagnosis present

## 2018-03-06 DIAGNOSIS — Z7982 Long term (current) use of aspirin: Secondary | ICD-10-CM

## 2018-03-06 DIAGNOSIS — Z9049 Acquired absence of other specified parts of digestive tract: Secondary | ICD-10-CM | POA: Diagnosis not present

## 2018-03-06 DIAGNOSIS — K56609 Unspecified intestinal obstruction, unspecified as to partial versus complete obstruction: Secondary | ICD-10-CM | POA: Diagnosis present

## 2018-03-06 DIAGNOSIS — E041 Nontoxic single thyroid nodule: Secondary | ICD-10-CM | POA: Diagnosis present

## 2018-03-06 DIAGNOSIS — Z886 Allergy status to analgesic agent status: Secondary | ICD-10-CM

## 2018-03-06 DIAGNOSIS — K565 Intestinal adhesions [bands], unspecified as to partial versus complete obstruction: Secondary | ICD-10-CM | POA: Diagnosis present

## 2018-03-06 DIAGNOSIS — Y838 Other surgical procedures as the cause of abnormal reaction of the patient, or of later complication, without mention of misadventure at the time of the procedure: Secondary | ICD-10-CM | POA: Diagnosis present

## 2018-03-06 DIAGNOSIS — K469 Unspecified abdominal hernia without obstruction or gangrene: Secondary | ICD-10-CM | POA: Diagnosis present

## 2018-03-06 DIAGNOSIS — G4730 Sleep apnea, unspecified: Secondary | ICD-10-CM | POA: Diagnosis present

## 2018-03-06 DIAGNOSIS — Z6834 Body mass index (BMI) 34.0-34.9, adult: Secondary | ICD-10-CM

## 2018-03-06 DIAGNOSIS — M545 Low back pain: Secondary | ICD-10-CM | POA: Diagnosis present

## 2018-03-06 DIAGNOSIS — I739 Peripheral vascular disease, unspecified: Secondary | ICD-10-CM | POA: Diagnosis present

## 2018-03-06 DIAGNOSIS — Z5331 Laparoscopic surgical procedure converted to open procedure: Secondary | ICD-10-CM | POA: Diagnosis not present

## 2018-03-06 DIAGNOSIS — F419 Anxiety disorder, unspecified: Secondary | ICD-10-CM | POA: Diagnosis present

## 2018-03-06 DIAGNOSIS — Z91013 Allergy to seafood: Secondary | ICD-10-CM

## 2018-03-06 DIAGNOSIS — M1990 Unspecified osteoarthritis, unspecified site: Secondary | ICD-10-CM | POA: Diagnosis present

## 2018-03-06 DIAGNOSIS — Z79899 Other long term (current) drug therapy: Secondary | ICD-10-CM | POA: Diagnosis not present

## 2018-03-06 DIAGNOSIS — M797 Fibromyalgia: Secondary | ICD-10-CM | POA: Diagnosis present

## 2018-03-06 DIAGNOSIS — E876 Hypokalemia: Secondary | ICD-10-CM | POA: Diagnosis present

## 2018-03-06 DIAGNOSIS — Z9884 Bariatric surgery status: Secondary | ICD-10-CM | POA: Diagnosis not present

## 2018-03-06 DIAGNOSIS — K9589 Other complications of other bariatric procedure: Principal | ICD-10-CM | POA: Diagnosis present

## 2018-03-06 DIAGNOSIS — Z9889 Other specified postprocedural states: Secondary | ICD-10-CM

## 2018-03-06 HISTORY — PX: BOWEL RESECTION: SHX1257

## 2018-03-06 HISTORY — PX: LAPAROTOMY: SHX154

## 2018-03-06 HISTORY — PX: LAPAROSCOPY: SHX197

## 2018-03-06 LAB — CBC
HCT: 39.2 % (ref 36.0–46.0)
Hemoglobin: 12.7 g/dL (ref 12.0–15.0)
MCH: 28.7 pg (ref 26.0–34.0)
MCHC: 32.4 g/dL (ref 30.0–36.0)
MCV: 88.5 fL (ref 78.0–100.0)
Platelets: 280 10*3/uL (ref 150–400)
RBC: 4.43 MIL/uL (ref 3.87–5.11)
RDW: 14.6 % (ref 11.5–15.5)
WBC: 10.4 10*3/uL (ref 4.0–10.5)

## 2018-03-06 LAB — URINALYSIS, ROUTINE W REFLEX MICROSCOPIC
Bilirubin Urine: NEGATIVE
Glucose, UA: NEGATIVE mg/dL
Hgb urine dipstick: NEGATIVE
Ketones, ur: NEGATIVE mg/dL
Leukocytes, UA: NEGATIVE
Nitrite: NEGATIVE
Protein, ur: NEGATIVE mg/dL
Specific Gravity, Urine: 1.018 (ref 1.005–1.030)
pH: 6 (ref 5.0–8.0)

## 2018-03-06 LAB — COMPREHENSIVE METABOLIC PANEL
ALT: 20 U/L (ref 14–54)
AST: 23 U/L (ref 15–41)
Albumin: 3.3 g/dL — ABNORMAL LOW (ref 3.5–5.0)
Alkaline Phosphatase: 83 U/L (ref 38–126)
Anion gap: 10 (ref 5–15)
BUN: 9 mg/dL (ref 6–20)
CO2: 30 mmol/L (ref 22–32)
Calcium: 9.1 mg/dL (ref 8.9–10.3)
Chloride: 106 mmol/L (ref 101–111)
Creatinine, Ser: 0.7 mg/dL (ref 0.44–1.00)
GFR calc Af Amer: >60 mL/min (ref >60)
GFR calc non Af Amer: >60 mL/min (ref >60)
Glucose, Bld: 87 mg/dL (ref 65–99)
Potassium: 2.6 mmol/L — CL (ref 3.5–5.1)
Sodium: 146 mmol/L — ABNORMAL HIGH (ref 135–145)
Total Bilirubin: 0.6 mg/dL (ref 0.3–1.2)
Total Protein: 6.2 g/dL — ABNORMAL LOW (ref 6.5–8.1)

## 2018-03-06 LAB — TROPONIN I: Troponin I: <0.03 ng/mL (ref <0.03)

## 2018-03-06 LAB — LIPASE, BLOOD: Lipase: 23 U/L (ref 11–51)

## 2018-03-06 LAB — I-STAT BETA HCG BLOOD, ED (MC, WL, AP ONLY): I-stat hCG, quantitative: <5 m[IU]/mL (ref <5)

## 2018-03-06 LAB — MAGNESIUM: Magnesium: 1.7 mg/dL (ref 1.7–2.4)

## 2018-03-06 SURGERY — LAPAROSCOPY, DIAGNOSTIC
Anesthesia: General | Site: Abdomen

## 2018-03-06 MED ORDER — FENTANYL CITRATE (PF) 250 MCG/5ML IJ SOLN
INTRAMUSCULAR | Status: AC
  Filled 2018-03-06: qty 5

## 2018-03-06 MED ORDER — ONDANSETRON HCL 4 MG/2ML IJ SOLN
INTRAMUSCULAR | Status: DC | PRN
  Administered 2018-03-06: 4 mg via INTRAVENOUS

## 2018-03-06 MED ORDER — BUPIVACAINE-EPINEPHRINE 0.25% -1:200000 IJ SOLN
INTRAMUSCULAR | Status: AC
  Filled 2018-03-06: qty 1

## 2018-03-06 MED ORDER — DOCUSATE SODIUM 100 MG PO CAPS: 100.0000 mg | ORAL_CAPSULE | Freq: Two times a day (BID) | ORAL | Status: DC

## 2018-03-06 MED ORDER — DEXAMETHASONE SODIUM PHOSPHATE 10 MG/ML IJ SOLN
INTRAMUSCULAR | Status: AC
  Filled 2018-03-06: qty 1

## 2018-03-06 MED ORDER — PROMETHAZINE HCL 25 MG/ML IJ SOLN: 6.2500 mg | INTRAMUSCULAR | Status: DC | PRN

## 2018-03-06 MED ORDER — GABAPENTIN 300 MG PO CAPS: 300.0000 mg | ORAL_CAPSULE | Freq: Two times a day (BID) | ORAL | Status: DC

## 2018-03-06 MED ORDER — SODIUM CHLORIDE 0.9 % IV SOLN
INTRAVENOUS | Status: DC
  Administered 2018-03-06: 23:00:00 via INTRAVENOUS

## 2018-03-06 MED ORDER — SUGAMMADEX SODIUM 200 MG/2ML IV SOLN
INTRAVENOUS | Status: AC
  Filled 2018-03-06: qty 2

## 2018-03-06 MED ORDER — SUGAMMADEX SODIUM 200 MG/2ML IV SOLN
INTRAVENOUS | Status: DC | PRN
  Administered 2018-03-06: 200 mg via INTRAVENOUS

## 2018-03-06 MED ORDER — HYDRALAZINE HCL 20 MG/ML IJ SOLN: 10.0000 mg | INTRAMUSCULAR | Status: DC | PRN

## 2018-03-06 MED ORDER — OXYCODONE HCL 5 MG PO TABS
5.0000 mg | ORAL_TABLET | ORAL | Status: DC | PRN
  Administered 2018-03-08 – 2018-03-10 (×7): 10 mg via ORAL
  Administered 2018-03-10: 5 mg via ORAL
  Administered 2018-03-10: 10 mg via ORAL
  Filled 2018-03-06 (×3): qty 2
  Filled 2018-03-06: qty 1
  Filled 2018-03-06 (×7): qty 2

## 2018-03-06 MED ORDER — METOCLOPRAMIDE HCL 5 MG/ML IJ SOLN
5.0000 mg | Freq: Once | INTRAMUSCULAR | Status: AC
  Administered 2018-03-06: 5 mg via INTRAVENOUS
  Filled 2018-03-06: qty 2

## 2018-03-06 MED ORDER — FAMOTIDINE IN NACL 20-0.9 MG/50ML-% IV SOLN
20.0000 mg | Freq: Two times a day (BID) | INTRAVENOUS | Status: DC
  Administered 2018-03-07 – 2018-03-08 (×4): 20 mg via INTRAVENOUS
  Filled 2018-03-06 (×5): qty 50

## 2018-03-06 MED ORDER — DEXAMETHASONE SODIUM PHOSPHATE 10 MG/ML IJ SOLN
INTRAMUSCULAR | Status: DC | PRN
  Administered 2018-03-06: 5 mg via INTRAVENOUS

## 2018-03-06 MED ORDER — PROPOFOL 10 MG/ML IV BOLUS
INTRAVENOUS | Status: AC
  Filled 2018-03-06: qty 20

## 2018-03-06 MED ORDER — ENOXAPARIN SODIUM 40 MG/0.4ML ~~LOC~~ SOLN
40.0000 mg | SUBCUTANEOUS | Status: DC
  Administered 2018-03-07 – 2018-03-09 (×3): 40 mg via SUBCUTANEOUS
  Filled 2018-03-06 (×3): qty 0.4

## 2018-03-06 MED ORDER — PHENYLEPHRINE 40 MCG/ML (10ML) SYRINGE FOR IV PUSH (FOR BLOOD PRESSURE SUPPORT)
PREFILLED_SYRINGE | INTRAVENOUS | Status: AC
  Filled 2018-03-06: qty 10

## 2018-03-06 MED ORDER — SUCCINYLCHOLINE CHLORIDE 200 MG/10ML IV SOSY
PREFILLED_SYRINGE | INTRAVENOUS | Status: AC
  Filled 2018-03-06: qty 10

## 2018-03-06 MED ORDER — SODIUM CHLORIDE 0.9 % IV BOLUS
1000.0000 mL | Freq: Once | INTRAVENOUS | Status: AC
  Administered 2018-03-06: 1000 mL via INTRAVENOUS

## 2018-03-06 MED ORDER — DIPHENHYDRAMINE HCL 12.5 MG/5ML PO ELIX: 12.5000 mg | ORAL_SOLUTION | Freq: Four times a day (QID) | ORAL | Status: DC | PRN

## 2018-03-06 MED ORDER — DULOXETINE HCL 60 MG PO CPEP: 60.0000 mg | ORAL_CAPSULE | Freq: Every day | ORAL | Status: DC

## 2018-03-06 MED ORDER — ROCURONIUM BROMIDE 10 MG/ML (PF) SYRINGE
PREFILLED_SYRINGE | INTRAVENOUS | Status: AC
  Filled 2018-03-06: qty 5

## 2018-03-06 MED ORDER — SODIUM CHLORIDE 0.9 % IR SOLN
Status: DC | PRN
  Administered 2018-03-06: 1

## 2018-03-06 MED ORDER — 0.9 % SODIUM CHLORIDE (POUR BTL) OPTIME
TOPICAL | Status: DC | PRN
  Administered 2018-03-06: 1000 mL
  Administered 2018-03-06: 3000 mL

## 2018-03-06 MED ORDER — ROCURONIUM BROMIDE 100 MG/10ML IV SOLN
INTRAVENOUS | Status: DC | PRN
  Administered 2018-03-06: 40 mg via INTRAVENOUS
  Administered 2018-03-06 (×3): 10 mg via INTRAVENOUS

## 2018-03-06 MED ORDER — LIDOCAINE HCL (CARDIAC) PF 100 MG/5ML IV SOSY
PREFILLED_SYRINGE | INTRAVENOUS | Status: DC | PRN
  Administered 2018-03-06: 100 mg via INTRAVENOUS
  Administered 2018-03-06 (×2): 50 mg via INTRAVENOUS

## 2018-03-06 MED ORDER — HYDROMORPHONE HCL 2 MG/ML IJ SOLN
INTRAMUSCULAR | Status: AC
  Filled 2018-03-06: qty 1

## 2018-03-06 MED ORDER — HYDROMORPHONE HCL 2 MG/ML IJ SOLN
0.2500 mg | INTRAMUSCULAR | Status: DC | PRN
  Administered 2018-03-06 (×4): 0.5 mg via INTRAVENOUS

## 2018-03-06 MED ORDER — IOHEXOL 300 MG/ML  SOLN
100.0000 mL | Freq: Once | INTRAMUSCULAR | Status: AC | PRN
  Administered 2018-03-06: 100 mL via INTRAVENOUS

## 2018-03-06 MED ORDER — METHOCARBAMOL 500 MG PO TABS: 500.0000 mg | ORAL_TABLET | Freq: Four times a day (QID) | ORAL | Status: DC | PRN

## 2018-03-06 MED ORDER — CEFAZOLIN SODIUM-DEXTROSE 2-4 GM/100ML-% IV SOLN
2.0000 g | INTRAVENOUS | Status: AC
  Administered 2018-03-06: 2 g via INTRAVENOUS
  Filled 2018-03-06: qty 100

## 2018-03-06 MED ORDER — BUPIVACAINE-EPINEPHRINE 0.25% -1:200000 IJ SOLN
INTRAMUSCULAR | Status: DC | PRN
  Administered 2018-03-06: 6 mL

## 2018-03-06 MED ORDER — MIDAZOLAM HCL 2 MG/2ML IJ SOLN
INTRAMUSCULAR | Status: AC
  Filled 2018-03-06: qty 2

## 2018-03-06 MED ORDER — ARTIFICIAL TEARS OPHTHALMIC OINT
TOPICAL_OINTMENT | OPHTHALMIC | Status: AC
  Filled 2018-03-06: qty 3.5

## 2018-03-06 MED ORDER — SODIUM CHLORIDE 0.9 % IV SOLN: Freq: Once | INTRAVENOUS | Status: DC

## 2018-03-06 MED ORDER — LIDOCAINE 2% (20 MG/ML) 5 ML SYRINGE
INTRAMUSCULAR | Status: AC
  Filled 2018-03-06: qty 5

## 2018-03-06 MED ORDER — HYDROMORPHONE HCL 2 MG/ML IJ SOLN
0.5000 mg | INTRAMUSCULAR | Status: DC | PRN
Start: 2018-03-06 — End: 2018-03-09
  Administered 2018-03-06 – 2018-03-08 (×11): 0.5 mg via INTRAVENOUS
  Filled 2018-03-06 (×11): qty 1

## 2018-03-06 MED ORDER — DEXTROSE 5 % IV SOLN
INTRAVENOUS | Status: DC | PRN
  Administered 2018-03-06: 50 ug/min via INTRAVENOUS

## 2018-03-06 MED ORDER — LACTATED RINGERS IV SOLN
INTRAVENOUS | Status: DC | PRN
  Administered 2018-03-06 (×2): via INTRAVENOUS

## 2018-03-06 MED ORDER — MEPERIDINE HCL 50 MG/ML IJ SOLN: 6.2500 mg | INTRAMUSCULAR | Status: DC | PRN

## 2018-03-06 MED ORDER — ACETAMINOPHEN 650 MG RE SUPP: 650.0000 mg | Freq: Four times a day (QID) | RECTAL | Status: DC | PRN

## 2018-03-06 MED ORDER — FENTANYL CITRATE (PF) 100 MCG/2ML IJ SOLN
INTRAMUSCULAR | Status: DC | PRN
  Administered 2018-03-06 (×5): 50 ug via INTRAVENOUS

## 2018-03-06 MED ORDER — ONDANSETRON HCL 4 MG/2ML IJ SOLN
INTRAMUSCULAR | Status: AC
  Filled 2018-03-06: qty 2

## 2018-03-06 MED ORDER — ARTIFICIAL TEARS OPHTHALMIC OINT
TOPICAL_OINTMENT | OPHTHALMIC | Status: DC | PRN
  Administered 2018-03-06: 1 via OPHTHALMIC

## 2018-03-06 MED ORDER — MIDAZOLAM HCL 5 MG/5ML IJ SOLN
INTRAMUSCULAR | Status: DC | PRN
  Administered 2018-03-06 (×2): 1 mg via INTRAVENOUS

## 2018-03-06 MED ORDER — SUCCINYLCHOLINE CHLORIDE 20 MG/ML IJ SOLN
INTRAMUSCULAR | Status: DC | PRN
Start: 2018-03-06 — End: 2018-03-06
  Administered 2018-03-06: 120 mg via INTRAVENOUS

## 2018-03-06 MED ORDER — ACETAMINOPHEN 325 MG PO TABS: 650.0000 mg | ORAL_TABLET | Freq: Four times a day (QID) | ORAL | Status: DC | PRN

## 2018-03-06 MED ORDER — ESMOLOL HCL 100 MG/10ML IV SOLN
INTRAVENOUS | Status: DC | PRN
  Administered 2018-03-06: 5 mg via INTRAVENOUS
  Administered 2018-03-06: 10 mg via INTRAVENOUS

## 2018-03-06 MED ORDER — DIPHENHYDRAMINE HCL 50 MG/ML IJ SOLN
12.5000 mg | Freq: Four times a day (QID) | INTRAMUSCULAR | Status: DC | PRN
Start: 2018-03-06 — End: 2018-03-10
  Administered 2018-03-08: 12.5 mg via INTRAVENOUS
  Filled 2018-03-06: qty 1

## 2018-03-06 MED ORDER — METOPROLOL TARTRATE 5 MG/5ML IV SOLN: 5.0000 mg | Freq: Four times a day (QID) | INTRAVENOUS | Status: DC | PRN

## 2018-03-06 MED ORDER — ONDANSETRON 4 MG PO TBDP
4.0000 mg | ORAL_TABLET | Freq: Once | ORAL | Status: AC | PRN
  Administered 2018-03-06: 4 mg via ORAL
  Filled 2018-03-06: qty 1

## 2018-03-06 MED ORDER — ONDANSETRON 4 MG PO TBDP: 4.0000 mg | ORAL_TABLET | Freq: Four times a day (QID) | ORAL | Status: DC | PRN

## 2018-03-06 MED ORDER — AMLODIPINE BESYLATE 5 MG PO TABS: 5.0000 mg | ORAL_TABLET | Freq: Every day | ORAL | Status: DC

## 2018-03-06 MED ORDER — ONDANSETRON HCL 4 MG/2ML IJ SOLN
4.0000 mg | Freq: Four times a day (QID) | INTRAMUSCULAR | Status: DC | PRN
  Administered 2018-03-07 – 2018-03-08 (×2): 4 mg via INTRAVENOUS
  Filled 2018-03-06 (×2): qty 2

## 2018-03-06 MED ORDER — METOPROLOL SUCCINATE ER 50 MG PO TB24
50.0000 mg | ORAL_TABLET | Freq: Every day | ORAL | Status: DC
  Administered 2018-03-07 – 2018-03-10 (×4): 50 mg via ORAL
  Filled 2018-03-06 (×4): qty 1

## 2018-03-06 MED ORDER — POTASSIUM CHLORIDE 10 MEQ/100ML IV SOLN
10.0000 meq | Freq: Once | INTRAVENOUS | Status: AC
  Administered 2018-03-06: 10 meq via INTRAVENOUS
  Filled 2018-03-06: qty 100

## 2018-03-06 MED ORDER — MORPHINE SULFATE (PF) 4 MG/ML IV SOLN
4.0000 mg | Freq: Once | INTRAVENOUS | Status: AC
  Administered 2018-03-06: 4 mg via INTRAVENOUS
  Filled 2018-03-06: qty 1

## 2018-03-06 MED ORDER — PROPOFOL 10 MG/ML IV BOLUS
INTRAVENOUS | Status: DC | PRN
  Administered 2018-03-06: 150 mg via INTRAVENOUS

## 2018-03-06 MED ORDER — IOHEXOL 300 MG/ML  SOLN: 100.0000 mL | Freq: Once | INTRAMUSCULAR | Status: DC | PRN

## 2018-03-06 SURGICAL SUPPLY — 60 items
ADH SKN CLS APL DERMABOND .7 (GAUZE/BANDAGES/DRESSINGS) ×1
APPLIER CLIP 5 13 M/L LIGAMAX5 (MISCELLANEOUS)
APR CLP MED LRG 5 ANG JAW (MISCELLANEOUS)
BLADE 10 SAFETY STRL DISP (BLADE) ×2 IMPLANT
CANISTER SUCT 3000ML PPV (MISCELLANEOUS) ×2 IMPLANT
CHLORAPREP W/TINT 26ML (MISCELLANEOUS) ×2 IMPLANT
CLIP APPLIE 5 13 M/L LIGAMAX5 (MISCELLANEOUS) IMPLANT
COVER SURGICAL LIGHT HANDLE (MISCELLANEOUS) ×2 IMPLANT
DERMABOND ADVANCED (GAUZE/BANDAGES/DRESSINGS) ×1
DERMABOND ADVANCED .7 DNX12 (GAUZE/BANDAGES/DRESSINGS) ×1 IMPLANT
DRAPE LAPAROSCOPIC ABDOMINAL (DRAPES) IMPLANT
DRAPE WARM FLUID 44X44 (DRAPE) ×4 IMPLANT
DRSG OPSITE POSTOP 4X10 (GAUZE/BANDAGES/DRESSINGS) IMPLANT
DRSG OPSITE POSTOP 4X8 (GAUZE/BANDAGES/DRESSINGS) ×2 IMPLANT
ELECT REM PT RETURN 9FT ADLT (ELECTROSURGICAL) ×2
ELECTRODE REM PT RTRN 9FT ADLT (ELECTROSURGICAL) ×1 IMPLANT
GAUZE SPONGE 4X4 12PLY STRL (GAUZE/BANDAGES/DRESSINGS) ×4 IMPLANT
GLOVE BIO SURGEON STRL SZ 6 (GLOVE) ×2 IMPLANT
GLOVE BIOGEL PI IND STRL 6.5 (GLOVE) ×1 IMPLANT
GLOVE BIOGEL PI IND STRL 7.0 (GLOVE) ×1 IMPLANT
GLOVE BIOGEL PI IND STRL 8 (GLOVE) ×1 IMPLANT
GLOVE BIOGEL PI INDICATOR 6.5 (GLOVE) ×1
GLOVE BIOGEL PI INDICATOR 7.0 (GLOVE) ×1
GLOVE BIOGEL PI INDICATOR 8 (GLOVE) ×1
GLOVE ECLIPSE 7.5 STRL STRAW (GLOVE) ×2 IMPLANT
GLOVE INDICATOR 6.5 STRL GRN (GLOVE) ×2 IMPLANT
GLOVE SURG SS PI 7.0 STRL IVOR (GLOVE) ×2 IMPLANT
GOWN STRL REUS W/ TWL LRG LVL3 (GOWN DISPOSABLE) ×4 IMPLANT
GOWN STRL REUS W/TWL LRG LVL3 (GOWN DISPOSABLE) ×8
KIT BASIN OR (CUSTOM PROCEDURE TRAY) ×2 IMPLANT
KIT TURNOVER KIT B (KITS) ×2 IMPLANT
LIGASURE IMPACT 36 18CM CVD LR (INSTRUMENTS) ×2 IMPLANT
NS IRRIG 1000ML POUR BTL (IV SOLUTION) ×2 IMPLANT
PACK GENERAL/GYN (CUSTOM PROCEDURE TRAY) ×2 IMPLANT
PAD ARMBOARD 7.5X6 YLW CONV (MISCELLANEOUS) ×4 IMPLANT
RELOAD PROXIMATE 75MM BLUE (ENDOMECHANICALS) ×4 IMPLANT
SCISSORS LAP 5X35 DISP (ENDOMECHANICALS) ×2 IMPLANT
SET IRRIG TUBING LAPAROSCOPIC (IRRIGATION / IRRIGATOR) ×2 IMPLANT
SLEEVE ENDOPATH XCEL 5M (ENDOMECHANICALS) ×2 IMPLANT
SPECIMEN JAR MEDIUM (MISCELLANEOUS) ×2 IMPLANT
SPONGE LAP 18X18 X RAY DECT (DISPOSABLE) ×2 IMPLANT
STAPLER GUN LINEAR PROX 60 (STAPLE) ×2 IMPLANT
STAPLER PROXIMATE 75MM BLUE (STAPLE) ×2 IMPLANT
STAPLER VISISTAT 35W (STAPLE) ×2 IMPLANT
SUCTION POOLE TIP (SUCTIONS) ×2 IMPLANT
SUT MNCRL AB 4-0 PS2 18 (SUTURE) ×2 IMPLANT
SUT PDS AB 1 TP1 96 (SUTURE) ×4 IMPLANT
SUT SILK 3 0 SH CR/8 (SUTURE) ×4 IMPLANT
SUT SILK 3 0 TIES 10X30 (SUTURE) ×2 IMPLANT
TOWEL OR 17X24 6PK STRL BLUE (TOWEL DISPOSABLE) ×2 IMPLANT
TOWEL OR 17X26 10 PK STRL BLUE (TOWEL DISPOSABLE) ×2 IMPLANT
TRAY FOL W/BAG SLVR 16FR STRL (SET/KITS/TRAYS/PACK) ×1 IMPLANT
TRAY FOLEY MTR SLVR 16FR STAT (SET/KITS/TRAYS/PACK) ×2 IMPLANT
TRAY FOLEY W/BAG SLVR 16FR LF (SET/KITS/TRAYS/PACK) ×2
TRAY LAPAROSCOPIC MC (CUSTOM PROCEDURE TRAY) ×2 IMPLANT
TROCAR XCEL BLUNT TIP 100MML (ENDOMECHANICALS) ×2 IMPLANT
TROCAR XCEL NON-BLD 5MMX100MML (ENDOMECHANICALS) ×6 IMPLANT
TUBING 1/4  WITH 7/8  ADAPTER (MISCELLANEOUS) ×2 IMPLANT
TUBING INSUFFLATION (TUBING) ×2 IMPLANT
YANKAUER SUCT BULB TIP NO VENT (SUCTIONS) ×2 IMPLANT

## 2018-03-06 NOTE — H&P (Signed)
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 Volanda Napoleon at [facility] in November 2017; in addition to previous surgeries listed below who developed severe upper mid abdominal pain around 7pm yesterday. The pain is "gripping" in quality and comes in waves but never fully resolves. Early this morning she started to have multiple episodes of yellow-mucoid emesis. This did not relieve the pain. Last bowel movement was this morning and normal. Some relief with narcotics in the ER but still uncomfortable. CT shows bowel obstruction.   Allergies  Allergen Reactions  . Shrimp [Shellfish Allergy] Anaphylaxis  . Naproxen Other (See Comments)    Makes stomach cramp and burning badly.    Past Medical History:  Diagnosis Date  . Arthritis   . Calculus of gallbladder without mention of cholecystitis or obstruction   . Dizziness   . Fibromyalgia   . Goiter   . Hypercholesteremia   . Hypertension   . Lower back pain   . Migraine   . Nontoxic uninodular goiter    sees dr vollmer at [facility]  . Obesity   . Sleep apnea    STOPBANG=5  . Stroke [facility]) 2000    Past Surgical History:  Procedure Laterality Date  . ABDOMINAL HYSTERECTOMY    . CESAREAN SECTION  yrs ago   done x 2  . CHOLECYSTECTOMY  01/05/2012   Procedure: LAPAROSCOPIC CHOLECYSTECTOMY WITH INTRAOPERATIVE CHOLANGIOGRAM;  Surgeon: Pedro Earls, MD;  Location: [facility] ORS;  Service: General;  Laterality: N/A;  . COLONOSCOPY  10/08/2012   Procedure: COLONOSCOPY;  Surgeon: Beryle Beams, MD;  Location: [facility] ENDOSCOPY;  Service: Endoscopy;  Laterality: N/A;  . KNEE ARTHROSCOPY  one 1995 and 1 in 1997   both knees done  . surgery for endometriosis  yrs ago  . thryoid biopsy  December 01, 2011    at [facility]    Family History  Problem Relation Age of Onset  . Diabetes Mother   . Hypertension Mother   . Transient ischemic  attack Mother   . Seizures Mother   . Dementia Mother   . Other Father        MVA  . Cancer Brother        colon and lung  . Cancer Maternal Grandmother        colon    Social History   Socioeconomic History  . Marital status: Married    Spouse name: Not on file  . Number of children: 2  . Years of education: 3  . Highest education level: Not on file  Occupational History  . Occupation: Unemployed  Social Needs  . Financial resource strain: Not on file  . Food insecurity:    Worry: Not on file    Inability: Not on file  . Transportation needs:    Medical: Not on file    Non-medical: Not on file  Tobacco Use  . Smoking status: Never Smoker  . Smokeless tobacco: Never Used  Substance and Sexual Activity  . Alcohol use: Yes    Alcohol/week: 0.0 oz    Comment: Occasionally  . Drug use: No  . Sexual activity: Not on file  Lifestyle  . Physical activity:    Days per week: Not on file    Minutes per session: Not on file  . Stress: Not on file  Relationships  . Social connections:  Talks on phone: Not on file    Gets together: Not on file    Attends religious service: Not on file    Active member of club or organization: Not on file    Attends meetings of clubs or organizations: Not on file    Relationship status: Not on file  Other Topics Concern  . Not on file  Social History Narrative   Lives at home with family.   Right-handed.   No caffeine use.    No current facility-administered medications on file prior to encounter.    Current Outpatient Medications on File Prior to Encounter  Medication Sig Dispense Refill  . amLODipine (NORVASC) 5 MG tablet Take 1 tablet (5 mg total) by mouth daily. 30 tablet 1  . aspirin EC 81 MG tablet Take 1 tablet (81 mg total) by mouth daily. With food 30 tablet 2  . Cholecalciferol 50000 units capsule Take 50,000 Units by mouth every Friday.    . DULoxetine (CYMBALTA) 60 MG capsule Take 60 mg by mouth daily.    .  fluticasone (FLONASE) 50 MCG/ACT nasal spray SPRAY 2 SPRAYS INTO EACH NOSTRIL EVERY DAY PRN ALLERGIES  0  . folic acid (FOLVITE) 1 MG tablet Take 1 mg by mouth daily.  11  . loratadine (CLARITIN) 10 MG tablet Take 10 mg by mouth daily.     Marland Kitchen LYRICA 25 MG capsule TAKE 1 CAPSULE BY MOUTH THREE TIMES A DAY (INSURANCE DENIED PRIOR AUTH)  3  . metoprolol succinate (TOPROL-XL) 50 MG 24 hr tablet TAKE 1 TABLET ER 24HR DAILY ORAL 30 tablet 3  . nitroGLYCERIN (NITROSTAT) 0.4 MG SL tablet Place 1 tablet (0.4 mg total) under the tongue every 5 (five) minutes x 3 doses as needed for chest pain. (Patient taking differently: Place 0.4 mg under the tongue every 5 (five) minutes as needed for chest pain. Maximum 3 doses) 25 tablet 12  . pantoprazole (PROTONIX) 40 MG tablet Take 40 mg by mouth 2 (two) times daily.     . potassium chloride SA (K-DUR,KLOR-CON) 20 MEQ tablet Take 1 tablet (20 mEq total) by mouth 2 (two) times daily. 20 tablet 0  . spironolactone (ALDACTONE) 25 MG tablet Take 1 tablet (25 mg total) by mouth daily. 30 tablet 1  . [DISCONTINUED] metoprolol (LOPRESSOR) 50 MG tablet Take 50 mg by mouth 2 (two) times daily.      . [DISCONTINUED] simvastatin (ZOCOR) 20 MG tablet Take 20 mg by mouth at bedtime.        Review of Systems: a complete, 10pt review of systems was completed with pertinent positives and negatives as documented in the HPI  Physical Exam: Vitals:   03/06/18 1515 03/06/18 1530  BP:    Pulse: 68 70  Resp: 12 18  Temp:    SpO2: 100% 98%   Gen: A&Ox3, no distress  Head: normocephalic, atraumatic Eyes: extraocular motions intact, anicteric.  Neck: supple without mass or thyromegaly Chest: unlabored respirations, symmetrical air entry, clear bilaterally   Cardiovascular: RRR with palpable distal pulses, no pedal edema Abdomen: soft, distended, tender in central abdomen with involuntary guarding Extremities: warm, without edema, no deformities  Neuro: grossly intact Psych:  appropriate mood and affect, normal insight  Skin: warm and dry   CBC Latest Ref Rng & Units 03/06/2018 12/16/2017 10/03/2016  WBC 4.0 - 10.5 K/uL 10.4 7.8 9.0  Hemoglobin 12.0 - 15.0 g/dL 12.7 13.3 13.4  Hematocrit 36.0 - 46.0 % 39.2 39.7 39.0  Platelets 150 -  400 K/uL 280 257 195    CMP Latest Ref Rng & Units 03/06/2018 12/18/2017 12/17/2017  Glucose 65 - 99 mg/dL 87 86 86  BUN 6 - 20 mg/dL 9 7 7   Creatinine 0.44 - 1.00 mg/dL 0.70 0.77 0.75  Sodium 135 - 145 mmol/L 146(H) 144 144  Potassium 3.5 - 5.1 mmol/L 2.6(LL) 3.2(L) 2.9(L)  Chloride 101 - 111 mmol/L 106 109 106  CO2 22 - 32 mmol/L 30 28 28   Calcium 8.9 - 10.3 mg/dL 9.1 8.6(L) 8.5(L)  Total Protein 6.5 - 8.1 g/dL 6.2(L) - -  Total Bilirubin 0.3 - 1.2 mg/dL 0.6 - -  Alkaline Phos 38 - 126 U/L 83 - -  AST 15 - 41 U/L 23 - -  ALT 14 - 54 U/L 20 - -    Lab Results  Component Value Date   INR 1.07 06/08/2015   INR 1.03 07/30/2014   INR 1.05 03/12/2014    Imaging: Ct Abdomen Pelvis W Contrast  Result Date: 03/06/2018 CLINICAL DATA:  Abdominal pain and vomiting. EXAM: CT ABDOMEN AND PELVIS WITH CONTRAST TECHNIQUE: Multidetector CT imaging of the abdomen and pelvis was performed using the standard protocol following bolus administration of intravenous contrast. CONTRAST:  122mL OMNIPAQUE IOHEXOL 300 MG/ML  SOLN COMPARISON:  10/03/2016 FINDINGS: Lower Chest: No acute findings. Hepatobiliary: No hepatic masses identified. Prior cholecystectomy. No evidence of biliary obstruction. Pancreas:  No mass or inflammatory changes. Spleen: Within normal limits in size and appearance. Adrenals/Urinary Tract: No masses identified. Several bilateral renal cysts again noted. No evidence of hydronephrosis. Stomach/Bowel: Previous gastric bypass surgery again noted. Moderate dilatation of efferent small bowel loops are seen in the left abdomen, with transition point in the entero-enteric anastomosis. This is likely due to anastomotic stricture or adhesion.  No evidence of focal inflammatory process. Mild ascites seen in the right perihepatic space and paracolic gutter. No evidence of free intraperitoneal air or pneumatosis. Normal appendix visualized. Vascular/Lymphatic: No pathologically enlarged lymph nodes. No abdominal aortic aneurysm. Reproductive: Prior hysterectomy noted. Adnexal regions are unremarkable in appearance. Other:  None. Musculoskeletal:  No suspicious bone lesions identified. IMPRESSION: Previous gastric bypass surgery, with small bowel obstruction involving the efferent loop likely due to stricture or adhesion at the enteroenteric anastomosis. Mild ascites.  No evidence of focal inflammatory process or abscess. Electronically Signed   By: Earle Gell M.D.   On: 03/06/2018 15:07      A/P: gastric bypass patient with acute onset pain and obstruction on CT. To Or for diagnostic laparoscopy possible laparotomy possible bowel resection. I discussed risks of surgery including bleeding, pain, scarring, injury to intraabdominal structures, abdominal catastrophe, blood clot/PE, pneumonia, heart attack, stroke, death. She expressed understanding. Questions welcomed and answered to her satisfaction.   Romana Juniper, MD [facility], [place] Pager [phone]

## 2018-03-06 NOTE — Anesthesia Preprocedure Evaluation (Signed)
Anesthesia Evaluation  Patient identified by MRN, date of birth, ID band Patient awake    Reviewed: Allergy & Precautions, H&P , NPO status , Patient's Chart, lab work & pertinent test results, reviewed documented beta blocker date and time   Airway Mallampati: III  TM Distance: >3 FB Neck ROM: Full    Dental  (+) Teeth Intact, Poor Dentition, Dental Advisory Given   Pulmonary neg pulmonary ROS, sleep apnea ,    Pulmonary exam normal breath sounds clear to auscultation       Cardiovascular hypertension, Pt. on medications and Pt. on home beta blockers + Peripheral Vascular Disease   Rhythm:Regular Rate:Normal     Neuro/Psych  Headaches, Anxiety CVA    GI/Hepatic negative GI ROS, Neg liver ROS,   Endo/Other  Morbid obesityThyroid goiter  Renal/GU negative Renal ROS     Musculoskeletal  (+) Arthritis , Fibromyalgia -  Abdominal (+) + obese,   Peds  Hematology negative hematology ROS (+)   Anesthesia Other Findings   Reproductive/Obstetrics negative OB ROS                             Anesthesia Physical  Anesthesia Plan  ASA: III  Anesthesia Plan: General   Post-op Pain Management:    Induction: Intravenous, Rapid sequence and Cricoid pressure planned  PONV Risk Score and Plan: 4 or greater and Ondansetron, Dexamethasone, Treatment may vary due to age or medical condition and Midazolam  Airway Management Planned: Oral ETT  Additional Equipment:   Intra-op Plan:   Post-operative Plan: Extubation in OR  Informed Consent: I have reviewed the patients History and Physical, chart, labs and discussed the procedure including the risks, benefits and alternatives for the proposed anesthesia with the patient or authorized representative who has indicated his/her understanding and acceptance.   Dental advisory given  Plan Discussed with: CRNA  Anesthesia Plan Comments:          Anesthesia Quick Evaluation

## 2018-03-06 NOTE — Anesthesia Procedure Notes (Signed)
Procedure Name: Intubation Date/Time: 03/06/2018 5:23 PM Performed by: Suzy Bouchard, CRNA Pre-anesthesia Checklist: Patient identified, Emergency Drugs available, Suction available, Patient being monitored and Timeout performed Patient Re-evaluated:Patient Re-evaluated prior to induction Oxygen Delivery Method: Circle system utilized Preoxygenation: Pre-oxygenation with 100% oxygen Induction Type: IV induction, Rapid sequence and Cricoid Pressure applied Laryngoscope Size: Miller and 2 Grade View: Grade I Tube type: Oral Tube size: 7.5 mm Number of attempts: 1 Airway Equipment and Method: Stylet Placement Confirmation: positive ETCO2,  ETT inserted through vocal cords under direct vision and breath sounds checked- equal and bilateral Secured at: 23 cm Tube secured with: Tape Dental Injury: Teeth and Oropharynx as per pre-operative assessment  Comments: Oropharynx clear on DL. +BBS

## 2018-03-06 NOTE — Transfer of Care (Signed)
Immediate Anesthesia Transfer of Care Note  Patient: Jodi Bowman  Procedure(s) Performed: LAPAROSCOPY DIAGNOSTIC WITH LYSIS OF ADHESIONS (N/A Abdomen) EXPLORATORY LAPAROTOMY, RESECTION OF DISTAL ROUX, CLOSURE OF INTERNAL HERNIA (N/A Abdomen) SMALL BOWEL ANASTAMOSIS (N/A Abdomen)  Patient Location: PACU  Anesthesia Type:General  Level of Consciousness: awake  Airway & Oxygen Therapy: Patient Spontanous Breathing and Patient connected to nasal cannula oxygen  Post-op Assessment: Report given to RN and Post -op Vital signs reviewed and stable  Post vital signs: Reviewed and stable  Last Vitals:  Vitals Value Taken Time  BP 149/101 03/06/2018  8:12 PM  Temp    Pulse 91 03/06/2018  8:14 PM  Resp 13 03/06/2018  8:14 PM  SpO2 95 % 03/06/2018  8:14 PM  Vitals shown include unvalidated device data.  Last Pain:  Vitals:   03/06/18 1532  TempSrc:   PainSc: 6          Complications: No apparent anesthesia complications

## 2018-03-06 NOTE — ED Notes (Signed)
Patient transported to CT 

## 2018-03-06 NOTE — ED Notes (Signed)
Lab called regarding critical Potassium result; MD notified

## 2018-03-06 NOTE — ED Triage Notes (Signed)
Pt to ER for evaluation of epigastric abdominal pain states pain began prior to vomiting. Pt actively vomiting at this time. Denies diarrhea.

## 2018-03-06 NOTE — ED Provider Notes (Addendum)
[facility] EMERGENCY DEPARTMENT Provider Note   CSN: [id number] Arrival date & time: 03/06/18  1123     History   Chief Complaint Chief Complaint  Patient presents with  . Abdominal Pain    HPI Jodi Bowman is a 57 y.o. female.  HPI complains of epigastric abdominal pain gradual onset 7 PM last night, nonradiating accompanied by nausea and vomiting.  She reports vomiting possibly 6 times since onset of pain.  Pain is exacerbated by sitting up, improved with lying down.  She denies shortness of breath.  She admits to nausea presently.  She denies any fever denies urinary symptoms.  She is never had similar discomfort before.  Denies chest pain.  No treatment prior to coming here.  Last bowel movement yesterday, normal Past Medical History:  Diagnosis Date  . Arthritis   . Calculus of gallbladder without mention of cholecystitis or obstruction   . Dizziness   . Fibromyalgia   . Goiter   . Hypercholesteremia   . Hypertension   . Lower back pain   . Migraine   . Nontoxic uninodular goiter    sees dr vollmer at [facility]  . Obesity   . Sleep apnea    STOPBANG=5  . Stroke [facility]) 2000  Fibromyalgia  Patient Active Problem List   Diagnosis Date Noted  . Chest pain, rule out acute myocardial infarction 12/17/2017  . Hypokalemia 12/17/2017  . Acute lower UTI 12/17/2017  . Vertigo 04/30/2017  . Small vessel disease, cerebrovascular 04/30/2017  . Chronic low back pain 08/01/2015  . Right hip pain 08/01/2015  . Abnormality of gait 08/01/2015  . Left leg weakness   . Low back pain with radiation   . Syncope 07/30/2014  . Atypical chest pain 03/12/2014  . Lap chole [facility] April 2013 01/29/2012  . Gallstones 12/04/2011  . Thyroid nodule-non neoplastic goiter by needle aspiration 12/04/2011  . GLUCOSE INTOLERANCE 03/12/2010  . DYSLIPIDEMIA 03/12/2010  . Chronic migraine 03/12/2010  . Carotid stenosis 03/12/2010  . CEREBROVASCULAR ACCIDENT 03/12/2010  .  LIPOMA 01/29/2010  . HEADACHE 01/29/2010  . ANKLE INJURY, RIGHT 04/12/2009  . PHARYNGITIS 03/21/2009  . Backache 03/01/2009  . ALLERGIC RHINITIS 03/17/2007  . LOW BACK PAIN 03/17/2007  . Essential hypertension 01/01/2007  . ANXIETY STATE NOS 09/11/2005    Past Surgical History:  Procedure Laterality Date  . ABDOMINAL HYSTERECTOMY    . CESAREAN SECTION  yrs ago   done x 2  . CHOLECYSTECTOMY  01/05/2012   Procedure: LAPAROSCOPIC CHOLECYSTECTOMY WITH INTRAOPERATIVE CHOLANGIOGRAM;  Surgeon: Pedro Earls, MD;  Location: [facility];  Service: General;  Laterality: N/A;  . COLONOSCOPY  10/08/2012   Procedure: COLONOSCOPY;  Surgeon: Beryle Beams, MD;  Location: [facility] ENDOSCOPY;  Service: Endoscopy;  Laterality: N/A;  . KNEE ARTHROSCOPY  one 1995 and 1 in 1997   both knees done  . surgery for endometriosis  yrs ago  . thryoid biopsy  December 01, 2011    at [facility]     OB History   None      Home Medications    Prior to Admission medications   Medication Sig Start Date End Date Taking? Authorizing Provider  acetaminophen (TYLENOL) 500 MG tablet Take 1,000 mg by mouth every 6 (six) hours as needed for moderate pain or headache.    [provider]  amLODipine (NORVASC) 5 MG tablet Take 1 tablet (5 mg total) by mouth daily. 12/19/17   Roxan Hockey, MD  aspirin EC  81 MG tablet Take 1 tablet (81 mg total) by mouth daily. With food 12/18/17 12/18/18  Roxan Hockey, MD  Cholecalciferol 50000 units capsule Take 50,000 Units by mouth every Friday.    [provider]  DULoxetine (CYMBALTA) 60 MG capsule Take 60 mg by mouth daily. 03/16/17   [provider]  EMBEDA 30-1.2 MG CPCR TAKE 1 CAPSULE BY MOUTH EVERY 12 HOURS 11/19/17   [provider]  fluticasone (FLONASE) 50 MCG/ACT nasal spray SPRAY 2 SPRAYS INTO EACH NOSTRIL EVERY DAY PRN ALLERGIES 11/08/17   [provider]  folic acid (FOLVITE) 1 MG tablet Take 1 mg by mouth daily. 10/14/17   [provider]  loratadine (CLARITIN) 10 MG tablet Take 10 mg by mouth daily.     [provider]  LYRICA 25 MG capsule TAKE 1 CAPSULE BY MOUTH THREE TIMES A DAY (INSURANCE DENIED PRIOR AUTH) 11/11/17   [provider]  metoprolol succinate (TOPROL-XL) 50 MG 24 hr tablet TAKE 1 TABLET ER 24HR DAILY ORAL 12/18/17   Roxan Hockey, MD  nitroGLYCERIN (NITROSTAT) 0.4 MG SL tablet Place 1 tablet (0.4 mg total) under the tongue every 5 (five) minutes x 3 doses as needed for chest pain. Patient taking differently: Place 0.4 mg under the tongue every 5 (five) minutes as needed for chest pain. Maximum 3 doses 03/13/14   Charolette Forward, MD  pantoprazole (PROTONIX) 40 MG tablet Take 40 mg by mouth 2 (two) times daily.     [provider]  potassium chloride SA (K-DUR,KLOR-CON) 20 MEQ tablet Take 1 tablet (20 mEq total) by mouth 2 (two) times daily. 25/42/70   Delora Fuel, MD  spironolactone (ALDACTONE) 25 MG tablet Take 1 tablet (25 mg total) by mouth daily. 12/18/17 12/18/18  Roxan Hockey, MD  metoprolol (LOPRESSOR) 50 MG tablet Take 50 mg by mouth 2 (two) times daily.    12/04/11  [provider]  simvastatin (ZOCOR) 20 MG tablet Take 20 mg by mouth at bedtime.    12/04/11  [provider]    Family History Family History  Problem Relation Age of Onset  . Diabetes Mother   . Hypertension Mother   . Transient ischemic attack Mother   . Seizures Mother   . Dementia Mother   . Other Father        MVA  . Cancer Brother        colon and lung  . Cancer Maternal Grandmother        colon    Social History Social History   Tobacco Use  . Smoking status: Never Smoker  . Smokeless tobacco: Never Used  Substance Use Topics  . Alcohol use: Yes    Alcohol/week: 0.0 oz    Comment: Occasionally  . Drug use: No     Allergies   Shrimp [shellfish allergy] and Naproxen   Review of Systems Review of Systems  Constitutional: Negative.   HENT: Negative.     Respiratory: Negative.   Cardiovascular: Negative.   Gastrointestinal: Positive for abdominal pain, nausea and vomiting.  Musculoskeletal: Negative.   Skin: Negative.   Neurological: Negative.   Psychiatric/Behavioral: Negative.   All other systems reviewed and are negative.    Physical Exam Updated Vital Signs BP (!) 171/84 (BP Location: Right Arm)   Pulse 64   Temp 97.7 F (36.5 C) (Oral)   Resp 18   Ht 4\' 11"  (1.499 m)   Wt 78 kg (172 lb)   SpO2 99%  BMI 34.74 kg/m   Physical Exam  Constitutional: She appears well-developed and well-nourished. She appears distressed.  Alert appears moderately uncomfortable.  Glasgow Coma Score 15  HENT:  Head: Normocephalic and atraumatic.  Eyes: Pupils are equal, round, and reactive to light. Conjunctivae are normal.  Neck: Neck supple. No tracheal deviation present. No thyromegaly present.  Cardiovascular: Normal rate and regular rhythm.  No murmur heard. Pulmonary/Chest: Effort normal and breath sounds normal.  Abdominal: Soft. Bowel sounds are normal. She exhibits no distension and no mass. There is tenderness. There is no guarding.  Tender at epigastrium and right upper quadrant  Musculoskeletal: Normal range of motion. She exhibits no edema or tenderness.  Neurological: She is alert. Coordination normal.  Skin: Skin is warm and dry. No rash noted.  Psychiatric: She has a normal mood and affect.  Nursing note and vitals reviewed.    ED Treatments / Results  Labs (all labs ordered are listed, but only abnormal results are displayed) Labs Reviewed  LIPASE, BLOOD  COMPREHENSIVE METABOLIC PANEL  CBC  URINALYSIS, ROUTINE W REFLEX MICROSCOPIC  TROPONIN I  I-STAT BETA HCG BLOOD, ED ([facility], [facility], [facility] ONLY)  I-STAT BETA HCG BLOOD, ED ([facility], [facility], [facility] ONLY)    EKG EKG Interpretation  Date/Time:  Saturday March 06 2018 12:20:17 EDT Ventricular Rate:  70 PR Interval:    QRS Duration: 87 QT Interval:  421 QTC Calculation: 455 R  Axis:   40 Text Interpretation:  Sinus rhythm Borderline abnrm T, anterolateral leads No significant change since last tracing Confirmed by Orlie Dakin [phone]) on 03/06/2018 12:40:34 PM   Radiology No results found.  Procedures Procedures (including critical care time)  Medications Ordered in ED Medications  morphine 4 MG/ML injection 4 mg (has no administration in time range)  metoCLOPramide (REGLAN) injection 5 mg (has no administration in time range)  sodium chloride 0.9 % bolus 1,000 mL (has no administration in time range)  ondansetron (ZOFRAN-ODT) disintegrating tablet 4 mg (4 mg Oral Given 03/06/18 1143)   Results for orders placed or performed during the hospital encounter of 03/06/18  Lipase, blood  Result Value Ref Range   Lipase 23 11 - 51 U/L  Comprehensive metabolic panel  Result Value Ref Range   Sodium 146 (H) 135 - 145 mmol/L   Potassium 2.6 (LL) 3.5 - 5.1 mmol/L   Chloride 106 101 - 111 mmol/L   CO2 30 22 - 32 mmol/L   Glucose, Bld 87 65 - 99 mg/dL   BUN 9 6 - 20 mg/dL   Creatinine, Ser 0.70 0.44 - 1.00 mg/dL   Calcium 9.1 8.9 - 10.3 mg/dL   Total Protein 6.2 (L) 6.5 - 8.1 g/dL   Albumin 3.3 (L) 3.5 - 5.0 g/dL   AST 23 15 - 41 U/L   ALT 20 14 - 54 U/L   Alkaline Phosphatase 83 38 - 126 U/L   Total Bilirubin 0.6 0.3 - 1.2 mg/dL   GFR calc non Af Amer >60 >60 mL/min   GFR calc Af Amer >60 >60 mL/min   Anion gap 10 5 - 15  CBC  Result Value Ref Range   WBC 10.4 4.0 - 10.5 K/uL   RBC 4.43 3.87 - 5.11 MIL/uL   Hemoglobin 12.7 12.0 - 15.0 g/dL   HCT 39.2 36.0 - 46.0 %   MCV 88.5 78.0 - 100.0 fL   MCH 28.7 26.0 - 34.0 pg   MCHC 32.4 30.0 - 36.0 g/dL   RDW 14.6 11.5 -  15.5 %   Platelets 280 150 - 400 K/uL  Urinalysis, Routine w reflex microscopic  Result Value Ref Range   Color, Urine YELLOW YELLOW   APPearance HAZY (A) CLEAR   Specific Gravity, Urine 1.018 1.005 - 1.030   pH 6.0 5.0 - 8.0   Glucose, UA NEGATIVE NEGATIVE mg/dL   Hgb urine dipstick  NEGATIVE NEGATIVE   Bilirubin Urine NEGATIVE NEGATIVE   Ketones, ur NEGATIVE NEGATIVE mg/dL   Protein, ur NEGATIVE NEGATIVE mg/dL   Nitrite NEGATIVE NEGATIVE   Leukocytes, UA NEGATIVE NEGATIVE  Troponin I  Result Value Ref Range   Troponin I <0.03 <0.03 ng/mL  Magnesium  Result Value Ref Range   Magnesium 1.7 1.7 - 2.4 mg/dL  I-Stat beta hCG blood, ED  Result Value Ref Range   I-stat hCG, quantitative <5.0 <5 mIU/mL   Comment 3           Ct Abdomen Pelvis W Contrast  Result Date: 03/06/2018 CLINICAL DATA:  Abdominal pain and vomiting. EXAM: CT ABDOMEN AND PELVIS WITH CONTRAST TECHNIQUE: Multidetector CT imaging of the abdomen and pelvis was performed using the standard protocol following bolus administration of intravenous contrast. CONTRAST:  158mL OMNIPAQUE IOHEXOL 300 MG/ML  SOLN COMPARISON:  10/03/2016 FINDINGS: Lower Chest: No acute findings. Hepatobiliary: No hepatic masses identified. Prior cholecystectomy. No evidence of biliary obstruction. Pancreas:  No mass or inflammatory changes. Spleen: Within normal limits in size and appearance. Adrenals/Urinary Tract: No masses identified. Several bilateral renal cysts again noted. No evidence of hydronephrosis. Stomach/Bowel: Previous gastric bypass surgery again noted. Moderate dilatation of efferent small bowel loops are seen in the left abdomen, with transition point in the entero-enteric anastomosis. This is likely due to anastomotic stricture or adhesion. No evidence of focal inflammatory process. Mild ascites seen in the right perihepatic space and paracolic gutter. No evidence of free intraperitoneal air or pneumatosis. Normal appendix visualized. Vascular/Lymphatic: No pathologically enlarged lymph nodes. No abdominal aortic aneurysm. Reproductive: Prior hysterectomy noted. Adnexal regions are unremarkable in appearance. Other:  None. Musculoskeletal:  No suspicious bone lesions identified. IMPRESSION: Previous gastric bypass surgery, with  small bowel obstruction involving the efferent loop likely due to stricture or adhesion at the enteroenteric anastomosis. Mild ascites.  No evidence of focal inflammatory process or abscess. Electronically Signed   By: Earle Gell M.D.   On: 03/06/2018 15:07    Initial Impression / Assessment and Plan / ED Course  I have reviewed the triage vital signs and the nursing notes.  Pertinent labs & imaging results that were available during my care of the patient were reviewed by me and considered in my medical decision making (see chart for details).     3:45 PM pain and nausea controlled after treatment with intravenous morphine and IV Reglan  Work remarkable for hypokalemia.  IV supplementation is ordered.  Patient to be maintained n.p.o..  I consulted Dr. Kae Heller from surgical service who will come to evaluate patient for admission  Final Clinical Impressions(s) / ED Diagnoses  Diagnoses #1 small bowel obstruction Final diagnoses:  None  #2 hypokalemia  ED Discharge Orders    None       Orlie Dakin, MD 03/06/18 [place], MD 03/06/18 1601

## 2018-03-06 NOTE — Op Note (Addendum)
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 The patient's abdomen was prepped and draped in the usual sterile fashion. Peritoneal access was gained using an optical entry in the right upper quadrant. Insufflation to 15 mmHg ensued without incident. Inspection confirmed no injury from our entry. There was dilated proximal small bowel in the left upper quadrant and a small amount of slightly murky ascites present. Under direct visualization 2 more 5 m trochars were inserted after infiltration with local. Omental adhesions to the anterior abdominal wall were taken down sharply. There were also adhesions of the dilated small bowel and anastomosis to the anterior abdominal wall which were taken down sharply.  At this point the ileocecal valve was identified and the small bowel was run from distal to proximal until we reached the jejunojejunostomy. The Roux limb up to the level of the jejunojejunostomy was very dilated, however the BP limb appeared to be of normal caliber. The mesenteric defect at the  jejunojejunostomy was widely patent and bowel was herniating in and out of this throughout the case. The gastrojejunostomy was inspected and appeared viable and widely patent. The candycane faces to the patient's left and there is continuous mesentery between this and the distal end of the BP limb which is quite long but of normal caliber and did not appear obstructed.. At this point the case was converted to open, a midline laparotomy was created sharply and the soft tissues were dissected with cautery until linea alba could be identified and divided. A retractor was placed within the abdomen and then the bowel was once again run to establish our anatomy. The Roux limb is about 25 cm long originally, and the common channel was about 200 cm long originally. The BP limb is very long.  The obstruction was clearly secondary to a stricture of the Roux limb just proximal to the jejunojejunostomy. The anastomosis itself was widely patent. The strictured segment of the Roux limb was approximately 5 cm long and this was resected using 2 fires of the blue load linear cutting stapler, leaving the original jejunojejunostomy intact. A new enteroenterostomy was created between the distal Roux limb and the common channel approximately 10 cm distal from the original JJ on the common channel using a 88mm blue load linear cutting stapler. The common enterotomy was closed with a TX 60 blue. This staple line was oversewn with interrupted 3-0 silk's. A 3-0 silk was also placed at the apex of the staple line to reduce tension on the anastomosis. Interrupted 3-0 silk sutures were then used to close the mesenteric defect between the Roux limb and the common channel. Interrupted 3-0 silk sutures were also used to close the very wide mesenteric defect at the location of the original JJ. On completion the anastomosis was  widely patent and hemostatic and appeared well perfused. The original JJ also remained widely patent and appeared well  perfused and healthy. The bowel was once again run to confirm appropriate anatomy. An NG tube was inserted by the CRNA, this tube goes past the gastrojejunostomy and sits within the Roux limb several centimeters proximal to the new small bowel anastomosis. The abdomen was then irrigated with warm sterile saline and hemostasis confirmed. The omentum was brought back down over the small bowel. The fascia was then closed with running looped #1 PDS starting at either end and tying centrally. The wound was irrigated and hemostasis ensured. Finally the midline wound as well as the two remaining 5 mm trocar sites were closed with staples and a sterile dressing was applied.  The patient was then awakened, extubated and taken to PACU in stable condition.   All counts were correct at the completion of the case.

## 2018-03-07 LAB — CBC
HCT: 39.6 % (ref 36.0–46.0)
Hemoglobin: 12.8 g/dL (ref 12.0–15.0)
MCH: 29 pg (ref 26.0–34.0)
MCHC: 32.3 g/dL (ref 30.0–36.0)
MCV: 89.8 fL (ref 78.0–100.0)
Platelets: 255 10*3/uL (ref 150–400)
RBC: 4.41 MIL/uL (ref 3.87–5.11)
RDW: 14.6 % (ref 11.5–15.5)
WBC: 20.5 10*3/uL — ABNORMAL HIGH (ref 4.0–10.5)

## 2018-03-07 LAB — PHOSPHORUS: Phosphorus: 4 mg/dL (ref 2.5–4.6)

## 2018-03-07 LAB — BASIC METABOLIC PANEL
Anion gap: 8 (ref 5–15)
BUN: 6 mg/dL (ref 6–20)
CO2: 30 mmol/L (ref 22–32)
Calcium: 8.5 mg/dL — ABNORMAL LOW (ref 8.9–10.3)
Chloride: 107 mmol/L (ref 101–111)
Creatinine, Ser: 0.65 mg/dL (ref 0.44–1.00)
GFR calc Af Amer: >60 mL/min (ref >60)
GFR calc non Af Amer: >60 mL/min (ref >60)
Glucose, Bld: 134 mg/dL — ABNORMAL HIGH (ref 65–99)
Potassium: 2.7 mmol/L — CL (ref 3.5–5.1)
Sodium: 145 mmol/L (ref 135–145)

## 2018-03-07 LAB — MAGNESIUM: Magnesium: 1.4 mg/dL — ABNORMAL LOW (ref 1.7–2.4)

## 2018-03-07 MED ORDER — KCL IN DEXTROSE-NACL 20-5-0.45 MEQ/L-%-% IV SOLN
INTRAVENOUS | Status: DC
  Administered 2018-03-07 – 2018-03-09 (×4): via INTRAVENOUS
  Filled 2018-03-07 (×6): qty 1000

## 2018-03-07 MED ORDER — POTASSIUM CHLORIDE 10 MEQ/100ML IV SOLN
10.0000 meq | INTRAVENOUS | Status: AC
  Administered 2018-03-07 (×2): 10 meq via INTRAVENOUS
  Filled 2018-03-07 (×2): qty 100

## 2018-03-07 NOTE — Anesthesia Postprocedure Evaluation (Signed)
Anesthesia Post Note  Patient: Jodi Bowman  Procedure(s) Performed: LAPAROSCOPY DIAGNOSTIC WITH LYSIS OF ADHESIONS (N/A Abdomen) EXPLORATORY LAPAROTOMY, RESECTION OF DISTAL ROUX, CLOSURE OF INTERNAL HERNIA (N/A Abdomen) SMALL BOWEL ANASTAMOSIS (N/A Abdomen)     Patient location during evaluation: PACU Anesthesia Type: General Level of consciousness: sedated and patient cooperative Pain management: pain level controlled Vital Signs Assessment: post-procedure vital signs reviewed and stable Respiratory status: spontaneous breathing Cardiovascular status: stable Anesthetic complications: no    Last Vitals:  Vitals:   03/07/18 1705 03/07/18 2131  BP: (!) 145/80 (!) 142/75  Pulse: 80 74  Resp: 16 16  Temp: 37.6 C 36.8 C  SpO2: 99% 100%    Last Pain:  Vitals:   03/07/18 2131  TempSrc: Oral  PainSc:                  Nolon Nations

## 2018-03-07 NOTE — Progress Notes (Signed)
0130 -lower portion of honeycomb dressing saturated with bloody drainage and moderate amount of  drainage had leaked onto patient's gown and linens. Dressing reinforced with ABD pads.Linens and gown changed.

## 2018-03-07 NOTE — Progress Notes (Signed)
[facility] Office:  [phone] General Surgery Progress Note   LOS: 1 day  POD -  1 Day Post-Op  Chief Complaint: Abdominal pain  Assessment and Plan: 1. LAPAROSCOPY DIAGNOSTIC WITH LYSIS OF ADHESIONS EXPLORATORY LAPAROTOMY, RESECTION OF DISTAL ROUX, CLOSURE OF INTERNAL HERNIA, SMALL BOWEL ANASTAMOSIS - 03/06/2018 - C. Connor  For stricture/internal hernia  WBC - 20,500 - 03/07/2018  NGT removed, to keep NPO for at least another day.  2.  RYGB - 2017 - at [facility]  Successful weight loss of about 125 pounds 3.  Hypokalemia  Replace K+ and add K+ to IVF 4. HTN 5. Fibromyalgia 6.  On chronic disability for her back and can't stand for long periods of time 7. DVT prophylaxis - Lovenox   Active Problems:   S/P exploratory laparotomy   Bowel obstruction (HCC)  Subjective:  Doing okay.  Pain controlled.  Objective:   Vitals:   03/07/18 0221 03/07/18 0604  BP: (!) 162/90 (!) 160/87  Pulse: (!) 102 94  Resp: 13 13  Temp: 98.5 F (36.9 C) 99.3 F (37.4 C)  SpO2: 100% 100%     Intake/Output from previous day:  06/08 0701 - 06/09 0700 In: 2787.5 [I.V.:2437.5] Out: 2345 [Urine:2295; Blood:50]  Intake/Output this shift:  No intake/output data recorded.   Physical Exam:   General: WN AA F who is alert and oriented.    HEENT: Normal. Pupils equal. .   Lungs: Clear  IS = 1,200 cc   Abdomen: Soft.  Quiet.   Wound: Dressed   Lab Results:    Recent Labs    03/06/18 1203 03/07/18 0515  WBC 10.4 20.5*  HGB 12.7 12.8  HCT 39.2 39.6  PLT 280 255    BMET   Recent Labs    03/06/18 1203 03/07/18 0515  NA 146* 145  K 2.6* 2.7*  CL 106 107  CO2 30 30  GLUCOSE 87 134*  BUN 9 6  CREATININE 0.70 0.65  CALCIUM 9.1 8.5*    PT/INR  No results for input(s): LABPROT, INR in the last 72 hours.  ABG  No results for input(s): PHART, HCO3 in the last 72 hours.  Invalid input(s): PCO2, PO2   Studies/Results:  Ct Abdomen Pelvis W Contrast  Result Date:  03/06/2018 CLINICAL DATA:  Abdominal pain and vomiting. EXAM: CT ABDOMEN AND PELVIS WITH CONTRAST TECHNIQUE: Multidetector CT imaging of the abdomen and pelvis was performed using the standard protocol following bolus administration of intravenous contrast. CONTRAST:  134mL OMNIPAQUE IOHEXOL 300 MG/ML  SOLN COMPARISON:  10/03/2016 FINDINGS: Lower Chest: No acute findings. Hepatobiliary: No hepatic masses identified. Prior cholecystectomy. No evidence of biliary obstruction. Pancreas:  No mass or inflammatory changes. Spleen: Within normal limits in size and appearance. Adrenals/Urinary Tract: No masses identified. Several bilateral renal cysts again noted. No evidence of hydronephrosis. Stomach/Bowel: Previous gastric bypass surgery again noted. Moderate dilatation of efferent small bowel loops are seen in the left abdomen, with transition point in the entero-enteric anastomosis. This is likely due to anastomotic stricture or adhesion. No evidence of focal inflammatory process. Mild ascites seen in the right perihepatic space and paracolic gutter. No evidence of free intraperitoneal air or pneumatosis. Normal appendix visualized. Vascular/Lymphatic: No pathologically enlarged lymph nodes. No abdominal aortic aneurysm. Reproductive: Prior hysterectomy noted. Adnexal regions are unremarkable in appearance. Other:  None. Musculoskeletal:  No suspicious bone lesions identified. IMPRESSION: Previous gastric bypass surgery, with small bowel obstruction involving the efferent loop likely due to stricture or adhesion  at the enteroenteric anastomosis. Mild ascites.  No evidence of focal inflammatory process or abscess. Electronically Signed   By: Earle Gell M.D.   On: 03/06/2018 15:07     Anti-infectives:   Anti-infectives (From admission, onward)   Start     Dose/Rate Route Frequency Ordered Stop   03/07/18 0600  ceFAZolin (ANCEF) IVPB 2g/100 mL premix     2 g 200 mL/hr over 30 Minutes Intravenous To Surgery  03/06/18 1612 03/06/18 1729      Alphonsa Overall, MD, FACS Pager: [facility] Office: [phone] 03/07/2018

## 2018-03-08 ENCOUNTER — Encounter (HOSPITAL_COMMUNITY): Payer: Self-pay | Admitting: Surgery

## 2018-03-08 LAB — BASIC METABOLIC PANEL
Anion gap: 5 (ref 5–15)
BUN: <5 mg/dL — ABNORMAL LOW (ref 6–20)
CO2: 31 mmol/L (ref 22–32)
Calcium: 8.3 mg/dL — ABNORMAL LOW (ref 8.9–10.3)
Chloride: 108 mmol/L (ref 101–111)
Creatinine, Ser: 0.76 mg/dL (ref 0.44–1.00)
GFR calc Af Amer: >60 mL/min (ref >60)
GFR calc non Af Amer: >60 mL/min (ref >60)
Glucose, Bld: 94 mg/dL (ref 65–99)
Potassium: 3 mmol/L — ABNORMAL LOW (ref 3.5–5.1)
Sodium: 144 mmol/L (ref 135–145)

## 2018-03-08 LAB — CBC
HCT: 36.7 % (ref 36.0–46.0)
Hemoglobin: 11.5 g/dL — ABNORMAL LOW (ref 12.0–15.0)
MCH: 28.8 pg (ref 26.0–34.0)
MCHC: 31.3 g/dL (ref 30.0–36.0)
MCV: 91.8 fL (ref 78.0–100.0)
Platelets: 212 10*3/uL (ref 150–400)
RBC: 4 MIL/uL (ref 3.87–5.11)
RDW: 15 % (ref 11.5–15.5)
WBC: 14.4 10*3/uL — ABNORMAL HIGH (ref 4.0–10.5)

## 2018-03-08 MED ORDER — POTASSIUM CHLORIDE CRYS ER 20 MEQ PO TBCR
40.0000 meq | EXTENDED_RELEASE_TABLET | Freq: Two times a day (BID) | ORAL | Status: AC
  Administered 2018-03-08 (×2): 40 meq via ORAL
  Filled 2018-03-08 (×2): qty 2

## 2018-03-08 NOTE — Progress Notes (Signed)
Patient ID: Jodi Bowman, female   DOB: 1961/09/09, 57 y.o.   MRN: [id number]    2 Days Post-Op  Subjective: Pt feels well.  Sitting up in a chair. Walked yesterday in the halls.  Minimal nausea, but passing lots of flatus.  Objective: Vital signs in last 24 hours: Temp:  [97.8 F (36.6 C)-99.6 F (37.6 C)] 98.4 F (36.9 C) (06/10 0622) Pulse Rate:  [74-89] 75 (06/10 0622) Resp:  [15-17] 16 (06/10 0622) BP: (140-148)/(69-83) 144/80 (06/10 0622) SpO2:  [97 %-100 %] 100 % (06/10 0622)    Intake/Output from previous day: 06/09 0701 - 06/10 0700 In: 2760.4 [I.V.:2660.4; IV Piggyback:100] Out: 2925 [Urine:2925] Intake/Output this shift: No intake/output data recorded.  PE: Heart: regular Lungs: CTAB Abd: soft, +BS, obese, but ND, midline incision intact with staples.  Some old bloody drainage on honeycomb dressing.  Lap incisions covered with tape and gauze.  Lab Results:  Recent Labs    03/07/18 0515 03/08/18 0606  WBC 20.5* 14.4*  HGB 12.8 11.5*  HCT 39.6 36.7  PLT 255 212   BMET Recent Labs    03/06/18 1203 03/07/18 0515  NA 146* 145  K 2.6* 2.7*  CL 106 107  CO2 30 30  GLUCOSE 87 134*  BUN 9 6  CREATININE 0.70 0.65  CALCIUM 9.1 8.5*   PT/INR No results for input(s): LABPROT, INR in the last 72 hours. CMP     Component Value Date/Time   NA 145 03/07/2018 0515   K 2.7 (LL) 03/07/2018 0515   CL 107 03/07/2018 0515   CO2 30 03/07/2018 0515   GLUCOSE 134 (H) 03/07/2018 0515   BUN 6 03/07/2018 0515   CREATININE 0.65 03/07/2018 0515   CALCIUM 8.5 (L) 03/07/2018 0515   PROT 6.2 (L) 03/06/2018 1203   ALBUMIN 3.3 (L) 03/06/2018 1203   AST 23 03/06/2018 1203   ALT 20 03/06/2018 1203   ALKPHOS 83 03/06/2018 1203   BILITOT 0.6 03/06/2018 1203   GFRNONAA >60 03/07/2018 0515   GFRAA >60 03/07/2018 0515   Lipase     Component Value Date/Time   LIPASE 23 03/06/2018 1203       Studies/Results: Ct Abdomen Pelvis W Contrast  Result Date:  03/06/2018 CLINICAL DATA:  Abdominal pain and vomiting. EXAM: CT ABDOMEN AND PELVIS WITH CONTRAST TECHNIQUE: Multidetector CT imaging of the abdomen and pelvis was performed using the standard protocol following bolus administration of intravenous contrast. CONTRAST:  131mL OMNIPAQUE IOHEXOL 300 MG/ML  SOLN COMPARISON:  10/03/2016 FINDINGS: Lower Chest: No acute findings. Hepatobiliary: No hepatic masses identified. Prior cholecystectomy. No evidence of biliary obstruction. Pancreas:  No mass or inflammatory changes. Spleen: Within normal limits in size and appearance. Adrenals/Urinary Tract: No masses identified. Several bilateral renal cysts again noted. No evidence of hydronephrosis. Stomach/Bowel: Previous gastric bypass surgery again noted. Moderate dilatation of efferent small bowel loops are seen in the left abdomen, with transition point in the entero-enteric anastomosis. This is likely due to anastomotic stricture or adhesion. No evidence of focal inflammatory process. Mild ascites seen in the right perihepatic space and paracolic gutter. No evidence of free intraperitoneal air or pneumatosis. Normal appendix visualized. Vascular/Lymphatic: No pathologically enlarged lymph nodes. No abdominal aortic aneurysm. Reproductive: Prior hysterectomy noted. Adnexal regions are unremarkable in appearance. Other:  None. Musculoskeletal:  No suspicious bone lesions identified. IMPRESSION: Previous gastric bypass surgery, with small bowel obstruction involving the efferent loop likely due to stricture or adhesion at the enteroenteric anastomosis. Mild ascites.  No evidence of focal inflammatory process or abscess. Electronically Signed   By: Earle Gell M.D.   On: 03/06/2018 15:07    Anti-infectives: Anti-infectives (From admission, onward)   Start     Dose/Rate Route Frequency Ordered Stop   03/07/18 0600  ceFAZolin (ANCEF) IVPB 2g/100 mL premix     2 g 200 mL/hr over 30 Minutes Intravenous To Surgery 03/06/18  1612 03/06/18 1729       Assessment/Plan  POD 2, s/p lap converted to open LOA, SBR of distal roux limb, and closure of mesenteric defect of original JJ, Dr. Kae Heller 03-06-18 -patient doing well -adv to clear liquids -mobilize and pulm toilet -WBC down to 14 from 20K -original RYGB in 2017 at [facility]  HTN -on home meds  Hypokalemia -replace K today -check labs in am  FEN - clear liquids, decrease IVFs to 75cc/hr VTE - SCDs/Lovenox ID - none   LOS: 2 days    Henreitta Cea , [facility] 03/08/2018, 8:18 AM Pager: [phone]

## 2018-03-09 LAB — BASIC METABOLIC PANEL
Anion gap: 6 (ref 5–15)
BUN: <5 mg/dL — ABNORMAL LOW (ref 6–20)
CO2: 30 mmol/L (ref 22–32)
Calcium: 8.3 mg/dL — ABNORMAL LOW (ref 8.9–10.3)
Chloride: 107 mmol/L (ref 101–111)
Creatinine, Ser: 0.8 mg/dL (ref 0.44–1.00)
GFR calc Af Amer: >60 mL/min (ref >60)
GFR calc non Af Amer: >60 mL/min (ref >60)
Glucose, Bld: 84 mg/dL (ref 65–99)
Potassium: 3.7 mmol/L (ref 3.5–5.1)
Sodium: 143 mmol/L (ref 135–145)

## 2018-03-09 LAB — CBC
HCT: 33.9 % — ABNORMAL LOW (ref 36.0–46.0)
Hemoglobin: 10.7 g/dL — ABNORMAL LOW (ref 12.0–15.0)
MCH: 28.9 pg (ref 26.0–34.0)
MCHC: 31.6 g/dL (ref 30.0–36.0)
MCV: 91.6 fL (ref 78.0–100.0)
Platelets: 205 10*3/uL (ref 150–400)
RBC: 3.7 MIL/uL — ABNORMAL LOW (ref 3.87–5.11)
RDW: 14.6 % (ref 11.5–15.5)
WBC: 10.3 10*3/uL (ref 4.0–10.5)

## 2018-03-09 MED ORDER — FAMOTIDINE 20 MG PO TABS
20.0000 mg | ORAL_TABLET | Freq: Every day | ORAL | Status: DC
  Administered 2018-03-09 – 2018-03-10 (×2): 20 mg via ORAL
  Filled 2018-03-09 (×2): qty 1

## 2018-03-09 NOTE — Care Management Important Message (Signed)
Important Message  Patient Details  Name: Jodi Bowman MRN: [id number] Date of Birth: 10/15/1960   Medicare Important Message Given:  Yes    Kinnick Maus 03/09/2018, 1:32 PM

## 2018-03-09 NOTE — Progress Notes (Signed)
Pt has no iv access, declined new iv insertion, PA aware. Pt educated on importance of having patent iv access and agreeable to emergency insertion if need arises

## 2018-03-09 NOTE — Progress Notes (Signed)
Patient ID: Jodi Bowman, female   DOB: [birth year], 59 y.o.   MRN: [id number]    3 Days Post-Op  Subjective: Pt doing well.  Ambulating.  Tolerated clears yesterday.  Still passing lots of flatus, no BM yet.  Objective: Vital signs in last 24 hours: Temp:  [97.6 F (36.4 C)-99.1 F (37.3 C)] 98.6 F (37 C) (06/11 0541) Pulse Rate:  [66-76] 71 (06/11 0541) Resp:  [15-18] 18 (06/11 0541) BP: (138-147)/(72-90) 138/81 (06/11 0541) SpO2:  [99 %-100 %] 100 % (06/11 0541)    Intake/Output from previous day: 06/10 0701 - 06/11 0700 In: 1411 [P.O.:300; I.V.:1111] Out: 2500 [Urine:2500] Intake/Output this shift: No intake/output data recorded.  PE: Heart: regular Lungs: CTAB Abd: soft, appropriately tender, abdominal binder in place, +BS, incisions are intact.  Lab Results:  Recent Labs    03/08/18 0606 03/09/18 0458  WBC 14.4* 10.3  HGB 11.5* 10.7*  HCT 36.7 33.9*  PLT 212 205   BMET Recent Labs    03/08/18 0606 03/09/18 0458  NA 144 143  K 3.0* 3.7  CL 108 107  CO2 31 30  GLUCOSE 94 84  BUN <5* <5*  CREATININE 0.76 0.80  CALCIUM 8.3* 8.3*   PT/INR No results for input(s): LABPROT, INR in the last 72 hours. CMP     Component Value Date/Time   NA 143 03/09/2018 0458   K 3.7 03/09/2018 0458   CL 107 03/09/2018 0458   CO2 30 03/09/2018 0458   GLUCOSE 84 03/09/2018 0458   BUN <5 (L) 03/09/2018 0458   CREATININE 0.80 03/09/2018 0458   CALCIUM 8.3 (L) 03/09/2018 0458   PROT 6.2 (L) 03/06/2018 1203   ALBUMIN 3.3 (L) 03/06/2018 1203   AST 23 03/06/2018 1203   ALT 20 03/06/2018 1203   ALKPHOS 83 03/06/2018 1203   BILITOT 0.6 03/06/2018 1203   GFRNONAA >60 03/09/2018 0458   GFRAA >60 03/09/2018 0458   Lipase     Component Value Date/Time   LIPASE 23 03/06/2018 1203       Studies/Results: No results found.  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 Kae Heller 03-06-18 -patient doing well -adv to bariatric fulls -mobilize and pulm toilet -WBC normalized today -change IV  pepcid to oral -original RYGB in 2017 at [facility]  HTN -on home meds  Hypokalemia -3.7 today  FEN - fulls/SLIV VTE - SCDs/Lovenox ID - none   LOS: 3 days    Henreitta Cea , [facility] 03/09/2018, 7:28 AM Pager: [phone]

## 2018-03-10 LAB — BASIC METABOLIC PANEL
Anion gap: 5 (ref 5–15)
BUN: <5 mg/dL — ABNORMAL LOW (ref 6–20)
CO2: 32 mmol/L (ref 22–32)
Calcium: 8.7 mg/dL — ABNORMAL LOW (ref 8.9–10.3)
Chloride: 104 mmol/L (ref 101–111)
Creatinine, Ser: 0.86 mg/dL (ref 0.44–1.00)
GFR calc Af Amer: >60 mL/min (ref >60)
GFR calc non Af Amer: >60 mL/min (ref >60)
Glucose, Bld: 86 mg/dL (ref 65–99)
Potassium: 3.5 mmol/L (ref 3.5–5.1)
Sodium: 141 mmol/L (ref 135–145)

## 2018-03-10 MED ORDER — OXYCODONE HCL 5 MG PO TABS: 5.0000 mg | ORAL_TABLET | Freq: Four times a day (QID) | ORAL | 0 refills | Status: DC | PRN

## 2018-03-10 MED ORDER — NICOTINE 21 MG/24HR TD PT24: 21.0000 mg | MEDICATED_PATCH | Freq: Every day | TRANSDERMAL | Status: DC

## 2018-03-10 MED ORDER — DOCUSATE SODIUM 100 MG PO CAPS
100.0000 mg | ORAL_CAPSULE | Freq: Two times a day (BID) | ORAL | Status: DC
  Administered 2018-03-10: 100 mg via ORAL
  Filled 2018-03-10: qty 1

## 2018-03-10 MED ORDER — POTASSIUM CHLORIDE CRYS ER 20 MEQ PO TBCR
20.0000 meq | EXTENDED_RELEASE_TABLET | Freq: Two times a day (BID) | ORAL | Status: DC
  Administered 2018-03-10: 20 meq via ORAL
  Filled 2018-03-10: qty 1

## 2018-03-10 NOTE — Discharge Summary (Signed)
[facility]/Trauma Discharge Summary   Patient ID: Jodi Bowman MRN: [id number] DOB/AGE: 06/19/1961 57 y.o.  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 S/P exploratory laparotomy 03/06/2018  . Bowel obstruction ([facility]) 03/06/2018  . Chest pain, rule out acute myocardial infarction 12/17/2017  . Hypokalemia 12/17/2017  . Acute lower UTI 12/17/2017  . Vertigo 04/30/2017  . Small vessel disease, cerebrovascular 04/30/2017  . Chronic low back pain 08/01/2015  . Right hip pain 08/01/2015  . Abnormality of gait 08/01/2015  . Left leg weakness   . Low back pain with radiation   . Syncope 07/30/2014  . Atypical chest pain 03/12/2014  . Lap chole [facility] April 2013 01/29/2012  . Gallstones 12/04/2011  . Thyroid nodule-non neoplastic goiter by needle aspiration 12/04/2011  . GLUCOSE INTOLERANCE 03/12/2010  . DYSLIPIDEMIA 03/12/2010  . Chronic migraine 03/12/2010  . Carotid stenosis 03/12/2010  . CEREBROVASCULAR ACCIDENT 03/12/2010  . LIPOMA 01/29/2010  . HEADACHE 01/29/2010  . ANKLE INJURY, RIGHT 04/12/2009  . PHARYNGITIS 03/21/2009  . Backache 03/01/2009  . ALLERGIC RHINITIS 03/17/2007  . LOW BACK PAIN 03/17/2007  . Essential hypertension 01/01/2007  . ANXIETY STATE NOS 09/11/2005    Consultants none  Imaging: No results found.  Procedures Dr. Kae Bowman (03/06/18) - Diagnostic laparoscopy with laparoscopic lysis of adhesions, laparotomy with resection of distal roux limb and creation of small bowel anastamosis, closure of mesenteric defect of the original jejunojejunostomy  HPI: Very nice 57yo woman who is s/p lap roux en y gastric bypass (antecolic; op note does not mention  any closure of mesenteric defects) by Dr. Volanda Bowman at [facility] in November 2017; in addition to previous surgeries listed below who developed severe upper mid abdominal pain around 7pm yesterday. The pain is "gripping" in quality and comes in waves but never fully resolves. Early this morning she started to have multiple episodes of yellow-mucoid emesis. This did not relieve the pain. Last bowel movement was this morning and normal. Some relief with narcotics in the ER but still uncomfortable. CT shows bowel obstruction.   Hospital Course:  Patient was admitted and underwent procedure listed above.  Tolerated procedure well and was transferred to the floor.  Diet was advanced as tolerated.  Electrolytes were replaced as needed. On POD#4, the patient was voiding well, tolerating diet, ambulating well, pain well controlled, vital signs stable, incisions c/d/i and felt stable for discharge home.  Patient will follow up in our office as outlined below and knows to call with questions or concerns.    Patient was discharged in good condition.  The [place] Substance controlled database was reviewed prior to prescribing narcotic pain medication to this patient.  I did not see nor take part in this patient's care. All of the information provided in this discharge summary was taken from the patient's EMR.   Physical Exam: Please see progress noted from Jodi Re, PA-C from 06/12 for PE  Allergies as of 03/10/2018      Reactions   Shrimp [shellfish Allergy] Anaphylaxis   Naproxen Other (See Comments)   Makes stomach cramp and burning badly.      Medication List    STOP taking these medications   HYDROcodone-acetaminophen 10-325 MG tablet Commonly known as:  NORCO  TAKE these medications   amLODipine 5 MG tablet Commonly known as:  NORVASC Take 1 tablet (5 mg total) by mouth daily.   aspirin EC 81 MG tablet Take 1 tablet (81 mg total) by mouth daily. With food   Cholecalciferol 50000  units capsule Take 50,000 Units by mouth every Friday.   DULoxetine 60 MG capsule Commonly known as:  CYMBALTA Take 60 mg by mouth daily.   fluticasone 50 MCG/ACT nasal spray Commonly known as:  FLONASE SPRAY 2 SPRAYS INTO EACH NOSTRIL DAILY AS NEEDED FOR ALLERGIES   folic acid 1 MG tablet Commonly known as:  FOLVITE Take 1 mg by mouth daily.   loratadine 10 MG tablet Commonly known as:  CLARITIN Take 10 mg by mouth daily.   LYRICA 25 MG capsule Generic drug:  pregabalin TAKE 1 CAPSULE BY MOUTH THREE TIMES A DAY (INSURANCE DENIED PRIOR AUTH)   metoprolol succinate 50 MG 24 hr tablet Commonly known as:  TOPROL-XL TAKE 1 TABLET ER 24HR DAILY ORAL What changed:    how much to take  how to take this  when to take this  additional instructions   nitroGLYCERIN 0.4 MG SL tablet Commonly known as:  NITROSTAT Place 1 tablet (0.4 mg total) under the tongue every 5 (five) minutes x 3 doses as needed for chest pain. What changed:    when to take this  additional instructions   oxyCODONE 5 MG immediate release tablet Commonly known as:  Oxy IR/ROXICODONE Take 1 tablet (5 mg total) by mouth every 6 (six) hours as needed for moderate pain.   pantoprazole 40 MG tablet Commonly known as:  PROTONIX Take 40 mg by mouth 2 (two) times daily.   potassium chloride SA 20 MEQ tablet Commonly known as:  K-DUR,KLOR-CON Take 1 tablet (20 mEq total) by mouth 2 (two) times daily.   spironolactone 25 MG tablet Commonly known as:  ALDACTONE Take 1 tablet (25 mg total) by mouth daily.        Follow-up Information    [facility], [place]. Go on 03/22/2018.   Specialty:  General Surgery Why:  Appointment for staple removal scheduled for 10:00 AM. Please arrive 30 min prior to appointment time for check in. Bring photo ID and insurance information.  Contact information: [place] [address] [phone]        Jodi Riley, MD. Go on  03/31/2018.   Specialty:  General Surgery Why:  Your appointment is scheduled for 2:50 PM. Please arrive 15 min prior to appointment time for check in. Bring photo ID and insurance information.  Contact information: [address] [place] [address] [phone]           Signed: [facility] 03/10/2018, 3:28 PM Pager: [phone] Consults: [phone] Mon-Fri 7:00 am-4:30 pm Sat-Sun 7:00 am-11:30 am

## 2018-03-10 NOTE — Discharge Instructions (Signed)
[facility]      [facility] [phone]  OPEN ABDOMINAL SURGERY: POST OP INSTRUCTIONS  Always review your discharge instruction sheet given to you by the facility where your surgery was performed.  IF YOU HAVE DISABILITY OR FAMILY LEAVE FORMS, YOU MUST BRING THEM TO THE OFFICE FOR PROCESSING.  PLEASE DO NOT GIVE THEM TO YOUR DOCTOR.  1. A prescription for pain medication may be given to you upon discharge.  Take your pain medication as prescribed, if needed.  If narcotic pain medicine is not needed, then you may take acetaminophen (Tylenol) or ibuprofen (Advil) as needed. 2. Take your usually prescribed medications unless otherwise directed. 3. If you need a refill on your pain medication, please contact your pharmacy. They will contact our office to request authorization.  Prescriptions will not be filled after 5pm or on week-ends. 4. You should follow a light diet the first few days after arrival home, such as soup and crackers, pudding, etc.unless your doctor has advised otherwise. A high-fiber, low fat diet can be resumed as tolerated.   Be sure to include lots of fluids daily. Most patients will experience some swelling and bruising on the chest and neck area.  Ice packs will help.  Swelling and bruising can take several days to resolve 5. Most patients will experience some swelling and bruising in the area of the incision. Ice pack will help. Swelling and bruising can take several days to resolve..  6. It is common to experience some constipation if taking pain medication after surgery.  Increasing fluid intake and taking a stool softener will usually help or prevent this problem from occurring.  A mild laxative (Milk of Magnesia or Miralax) should be taken according to package directions if there are no bowel movements after 48 hours. 7.  You may have steri-strips (small skin tapes) in place directly over the incision.  These strips should be left on the skin for 7-10 days.  If your  surgeon used skin glue on the incision, you may shower in 24 hours.  The glue will flake off over the next 2-3 weeks.  Any sutures or staples will be removed at the office during your follow-up visit. You may find that a light gauze bandage over your incision may keep your staples from being rubbed or pulled. You may shower and replace the bandage daily. 8. ACTIVITIES:  You may resume regular (light) daily activities beginning the next day--such as daily self-care, walking, climbing stairs--gradually increasing activities as tolerated.  You may have sexual intercourse when it is comfortable.  Refrain from any heavy lifting or straining until approved by your doctor. a. You may drive when you no longer are taking prescription pain medication, you can comfortably wear a seatbelt, and you can safely maneuver your car and apply brakes  9. You should see your doctor in the office for a follow-up appointment approximately two weeks after your surgery.  Make sure that you call for this appointment within a day or two after you arrive home to insure a convenient appointment time.  WHEN TO CALL YOUR DOCTOR: 1. Fever over 101.0 2. Inability to urinate 3. Nausea and/or vomiting 4. Extreme swelling or bruising 5. Continued bleeding from incision. 6. Increased pain, redness, or drainage from the incision. 7. Difficulty swallowing or breathing 8. Muscle cramping or spasms. 9. Numbness or tingling in hands or feet or around lips.  The clinic staff is available to answer your questions during regular business hours.  Please   don't hesitate to call and ask to speak to one of the nurses if you have concerns.  For further questions, please visit [email or web address]   

## 2018-03-10 NOTE — Plan of Care (Addendum)
Patient is anxious to got home and advance her diet.  Dr. Ree Kida and agreed to advance.  Patient educated that Dr. Is looking for a BM before DC to home.  Furthermore, pain meds should be used sparingly  - since they would prolong this process and her goal of going home. Instead, drinking plenty of fluids, frequent ambulation and/or ice could be used  to assist with her discomfort. Patient agreed and voiced understanding to [place].

## 2018-03-10 NOTE — Progress Notes (Signed)
[facility] Progress Note  4 Days Post-Op  Subjective: CC: wants to eat Jodi Bowman tolerated FLD. Denies nausea or vomiting. Passing flatus, no BM yet. Had some abdominal pain this AM after using a straw, knows that she should not use straws moving forward. Pain overall is improving. Ambulating.   Objective: Vital signs in last 24 hours: Temp:  [98.5 F (36.9 C)-99 F (37.2 C)] 99 F (37.2 C) (06/12 0513) Pulse Rate:  [65-75] 75 (06/12 0934) Resp:  [18] 18 (06/12 0513) BP: (128-151)/(58-82) 148/82 (06/12 0934) SpO2:  [99 %-100 %] 100 % (06/12 0513)    Intake/Output from previous day: 06/11 0701 - 06/12 0700 In: 440 [P.O.:440] Out: 850 [Urine:850] Intake/Output this shift: No intake/output data recorded.  PE: Gen:  Alert, NAD, pleasant Card:  Regular rate and rhythm, pedal pulses 2+ BL Pulm:  Normal effort, clear to auscultation bilaterally Abd: Soft, appropriately tender, non-distended, bowel sounds present in all 4 quadrants, no HSM, incisions C/D/I with staples present Skin: warm and dry, no rashes  Psych: A&Ox3   Lab Results:  Recent Labs    03/08/18 0606 03/09/18 0458  WBC 14.4* 10.3  HGB 11.5* 10.7*  HCT 36.7 33.9*  PLT 212 205   BMET Recent Labs    03/08/18 0606 03/09/18 0458  NA 144 143  K 3.0* 3.7  CL 108 107  CO2 31 30  GLUCOSE 94 84  BUN <5* <5*  CREATININE 0.76 0.80  CALCIUM 8.3* 8.3*   PT/INR No results for input(s): LABPROT, INR in the last 72 hours. CMP     Component Value Date/Time   NA 143 03/09/2018 0458   K 3.7 03/09/2018 0458   CL 107 03/09/2018 0458   CO2 30 03/09/2018 0458   GLUCOSE 84 03/09/2018 0458   BUN <5 (L) 03/09/2018 0458   CREATININE 0.80 03/09/2018 0458   CALCIUM 8.3 (L) 03/09/2018 0458   PROT 6.2 (L) 03/06/2018 1203   ALBUMIN 3.3 (L) 03/06/2018 1203   AST 23 03/06/2018 1203   ALT 20 03/06/2018 1203   ALKPHOS 83 03/06/2018 1203   BILITOT 0.6 03/06/2018 1203   GFRNONAA >60 03/09/2018 0458   GFRAA  >60 03/09/2018 0458   Lipase     Component Value Date/Time   LIPASE 23 03/06/2018 1203       Studies/Results: No results found.  Anti-infectives: Anti-infectives (From admission, onward)   Start     Dose/Rate Route Frequency Ordered Stop   03/07/18 0600  ceFAZolin (ANCEF) IVPB 2g/100 mL premix     2 g 200 mL/hr over 30 Minutes Intravenous To Surgery 03/06/18 1612 03/06/18 1729       Assessment/Plan HTN - home meds Hx of CVA Migraine  POD 4, s/p lap converted to open LOA, SBR of distal roux limb, and closure of mesenteric defect of original JJ, Dr. Kae Heller 03-06-18 -Jodi Bowman doing well -adv to bariatric fulls -mobilize and pulm toilet -WBC normalized today -change IV  pepcid to oral -original RYGB in 2017 at [facility] Hypokalemia -3.7 yesterday, recheck today  FEN -bariatric advanced VTE -SCDs/Lovenox ID -none  Plan: recheck BMET, advance diet. Possibly home later today vs tomorrow depending on labs and bowel function    LOS: 4 days    [name] , [facility] 03/10/2018, 10:39 AM Pager: [phone] Consults: [phone] Mon-Fri 7:00 am-4:30 pm Sat-Sun 7:00 am-11:30 am

## 2018-03-10 NOTE — Progress Notes (Signed)
Pt is discharged to go home.  Discharge instructions and prescription given. 

## 2018-06-26 LAB — GLUCOSE, POCT (MANUAL RESULT ENTRY): POC Glucose: 123 mg/dl — AB (ref 70–99)

## 2018-09-04 ENCOUNTER — Emergency Department (HOSPITAL_COMMUNITY): Payer: Medicare Other

## 2018-09-04 ENCOUNTER — Other Ambulatory Visit: Payer: Self-pay

## 2018-09-04 ENCOUNTER — Encounter (HOSPITAL_COMMUNITY): Payer: Self-pay | Admitting: Emergency Medicine

## 2018-09-04 ENCOUNTER — Emergency Department (HOSPITAL_COMMUNITY)
Admission: EM | Admit: 2018-09-04 | Discharge: 2018-09-05 | Disposition: A | Discharge status: Home / Self Care | Payer: Medicare Other | Attending: Emergency Medicine | Admitting: Emergency Medicine

## 2018-09-04 DIAGNOSIS — Z9884 Bariatric surgery status: Secondary | ICD-10-CM | POA: Insufficient documentation

## 2018-09-04 DIAGNOSIS — Z8673 Personal history of transient ischemic attack (TIA), and cerebral infarction without residual deficits: Secondary | ICD-10-CM | POA: Diagnosis not present

## 2018-09-04 DIAGNOSIS — I10 Essential (primary) hypertension: Secondary | ICD-10-CM | POA: Insufficient documentation

## 2018-09-04 DIAGNOSIS — R6883 Chills (without fever): Secondary | ICD-10-CM | POA: Insufficient documentation

## 2018-09-04 DIAGNOSIS — M545 Low back pain: Secondary | ICD-10-CM | POA: Diagnosis not present

## 2018-09-04 DIAGNOSIS — Z79899 Other long term (current) drug therapy: Secondary | ICD-10-CM | POA: Insufficient documentation

## 2018-09-04 DIAGNOSIS — M25551 Pain in right hip: Secondary | ICD-10-CM | POA: Insufficient documentation

## 2018-09-04 DIAGNOSIS — R52 Pain, unspecified: Secondary | ICD-10-CM

## 2018-09-04 LAB — CBC
HCT: 38 % (ref 36.0–46.0)
Hemoglobin: 11.8 g/dL — ABNORMAL LOW (ref 12.0–15.0)
MCH: 27.8 pg (ref 26.0–34.0)
MCHC: 31.1 g/dL (ref 30.0–36.0)
MCV: 89.6 fL (ref 80.0–100.0)
Platelets: 238 10*3/uL (ref 150–400)
RBC: 4.24 MIL/uL (ref 3.87–5.11)
RDW: 14.7 % (ref 11.5–15.5)
WBC: 8.3 10*3/uL (ref 4.0–10.5)
nRBC: 0 % (ref 0.0–0.2)

## 2018-09-04 LAB — BASIC METABOLIC PANEL
Anion gap: 11 (ref 5–15)
BUN: 12 mg/dL (ref 6–20)
CO2: 22 mmol/L (ref 22–32)
Calcium: 8.6 mg/dL — ABNORMAL LOW (ref 8.9–10.3)
Chloride: 106 mmol/L (ref 98–111)
Creatinine, Ser: 0.69 mg/dL (ref 0.44–1.00)
GFR calc Af Amer: >60 mL/min (ref >60)
GFR calc non Af Amer: >60 mL/min (ref >60)
Glucose, Bld: 94 mg/dL (ref 70–99)
Potassium: 3.1 mmol/L — ABNORMAL LOW (ref 3.5–5.1)
Sodium: 139 mmol/L (ref 135–145)

## 2018-09-04 MED ORDER — LIDOCAINE 5 % EX PTCH
1.0000 | MEDICATED_PATCH | Freq: Once | CUTANEOUS | Status: DC
  Administered 2018-09-05: 1 via TRANSDERMAL
  Filled 2018-09-04: qty 1

## 2018-09-04 MED ORDER — OXYCODONE-ACETAMINOPHEN 5-325 MG PO TABS
2.0000 | ORAL_TABLET | Freq: Once | ORAL | Status: AC
  Administered 2018-09-05: 2 via ORAL
  Filled 2018-09-04: qty 2

## 2018-09-04 MED ORDER — POTASSIUM CHLORIDE CRYS ER 20 MEQ PO TBCR
40.0000 meq | EXTENDED_RELEASE_TABLET | Freq: Once | ORAL | Status: AC
  Administered 2018-09-05: 40 meq via ORAL
  Filled 2018-09-04: qty 2

## 2018-09-04 MED ORDER — KETOROLAC TROMETHAMINE 15 MG/ML IJ SOLN
15.0000 mg | Freq: Once | INTRAMUSCULAR | Status: AC
  Administered 2018-09-04: 15 mg via INTRAVENOUS
  Filled 2018-09-04: qty 1

## 2018-09-04 MED ORDER — IOHEXOL 300 MG/ML  SOLN
100.0000 mL | Freq: Once | INTRAMUSCULAR | Status: AC | PRN
  Administered 2018-09-04: 100 mL via INTRAVENOUS

## 2018-09-04 MED ORDER — PANTOPRAZOLE SODIUM 40 MG PO TBEC
40.0000 mg | DELAYED_RELEASE_TABLET | Freq: Every day | ORAL | Status: DC
  Administered 2018-09-04: 40 mg via ORAL
  Filled 2018-09-04: qty 1

## 2018-09-04 NOTE — ED Notes (Signed)
Delay in lab draw pt enroute to CT 

## 2018-09-04 NOTE — ED Triage Notes (Signed)
Pt c/o right hip pain and numbness x 3 days. Denies injury/trauma. Ambulatory.

## 2018-09-04 NOTE — ED Provider Notes (Signed)
[facility] EMERGENCY DEPARTMENT Provider Note   CSN: [id number] Arrival date & time: 09/04/18  2120     History   Chief Complaint Chief Complaint  Patient presents with  . Hip Pain    HPI Jodi Bowman is a 57 y.o. female.  2 days of hip pain.  Patient denies any trauma to the area.  Endorses multiple chills.  Denies any history of gout. 02/2018 CT abdominal pelvis did not note any arthritic changes.    Hip Pain  This is a new problem. The current episode started 2 days ago. The problem occurs constantly. The problem has been rapidly worsening. Pertinent negatives include no chest pain, no abdominal pain, no headaches and no shortness of breath. The symptoms are aggravated by bending, stress and exertion. Nothing relieves the symptoms. She has tried acetaminophen for the symptoms. The treatment provided no (Patient tried home oxycodone) relief.    Past Medical History:  Diagnosis Date  . Arthritis   . Calculus of gallbladder without mention of cholecystitis or obstruction   . Dizziness   . Fibromyalgia   . Goiter   . Hypercholesteremia   . Hypertension   . Lower back pain   . Migraine   . Nontoxic uninodular goiter    sees dr vollmer at [employer]  . Obesity   . Sleep apnea    STOPBANG=5  . Stroke [facility]) 2000    Patient Active Problem List   Diagnosis Date Noted  . S/P exploratory laparotomy 03/06/2018  . Bowel obstruction ([place]) 03/06/2018  . Chest pain, rule out acute myocardial infarction 12/17/2017  . Hypokalemia 12/17/2017  . Acute lower UTI 12/17/2017  . Vertigo 04/30/2017  . Small vessel disease, cerebrovascular 04/30/2017  . Chronic low back pain 08/01/2015  . Right hip pain 08/01/2015  . Abnormality of gait 08/01/2015  . Left leg weakness   . Low back pain with radiation   . Syncope 07/30/2014  . Atypical chest pain 03/12/2014  . Lap chole [facility] April 2013 01/29/2012  . Gallstones 12/04/2011  . Thyroid nodule-non neoplastic  goiter by needle aspiration 12/04/2011  . GLUCOSE INTOLERANCE 03/12/2010  . DYSLIPIDEMIA 03/12/2010  . Chronic migraine 03/12/2010  . Carotid stenosis 03/12/2010  . CEREBROVASCULAR ACCIDENT 03/12/2010  . LIPOMA 01/29/2010  . HEADACHE 01/29/2010  . ANKLE INJURY, RIGHT 04/12/2009  . PHARYNGITIS 03/21/2009  . Backache 03/01/2009  . ALLERGIC RHINITIS 03/17/2007  . LOW BACK PAIN 03/17/2007  . Essential hypertension 01/01/2007  . ANXIETY STATE NOS 09/11/2005    Past Surgical History:  Procedure Laterality Date  . ABDOMINAL HYSTERECTOMY    . BOWEL RESECTION N/A 03/06/2018   Procedure: SMALL BOWEL ANASTAMOSIS;  Surgeon: Clovis Riley, MD;  Location: [place];  Service: General;  Laterality: N/A;  . CESAREAN SECTION  yrs ago   done x 2  . CHOLECYSTECTOMY  01/05/2012   Procedure: LAPAROSCOPIC CHOLECYSTECTOMY WITH INTRAOPERATIVE CHOLANGIOGRAM;  Surgeon: Pedro Earls, MD;  Location: [facility];  Service: General;  Laterality: N/A;  . COLONOSCOPY  10/08/2012   Procedure: COLONOSCOPY;  Surgeon: Beryle Beams, MD;  Location: [facility];  Service: Endoscopy;  Laterality: N/A;  . KNEE ARTHROSCOPY  one 1995 and 1 in 1997   both knees done  . LAPAROSCOPY N/A 03/06/2018   Procedure: LAPAROSCOPY DIAGNOSTIC WITH LYSIS OF ADHESIONS;  Surgeon: Clovis Riley, MD;  Location: [place];  Service: General;  Laterality: N/A;  . LAPAROTOMY N/A 03/06/2018   Procedure: EXPLORATORY LAPAROTOMY, RESECTION OF DISTAL  ROUX, CLOSURE OF INTERNAL HERNIA;  Surgeon: Clovis Riley, MD;  Location: [place];  Service: General;  Laterality: N/A;  . surgery for endometriosis  yrs ago  . thryoid biopsy  December 01, 2011    at [facility]     OB History   None      Home Medications    Prior to Admission medications   Medication Sig Start Date End Date Taking? Authorizing Provider  aMILoride (MIDAMOR) 5 MG tablet Take 5 mg by mouth daily.   Yes [provider]  amLODipine (NORVASC) 5 MG tablet Take 1 tablet (5 mg total) by  mouth daily. 12/19/17  Yes Emokpae, Courage, MD  aspirin EC 81 MG tablet Take 1 tablet (81 mg total) by mouth daily. With food 12/18/17 12/18/18 Yes Roxan Hockey, MD  Cholecalciferol 50000 units capsule Take 50,000 Units by mouth every Friday.   Yes [provider]  DULoxetine (CYMBALTA) 60 MG capsule Take 60 mg by mouth daily. 03/16/17  Yes [provider]  fluticasone (FLONASE) 50 MCG/ACT nasal spray Place 1 spray into both nostrils daily as needed for allergies.  11/08/17  Yes [provider]  folic acid (FOLVITE) 1 MG tablet Take 1 mg by mouth daily. 10/14/17  Yes [provider]  HYDROcodone-acetaminophen (NORCO/VICODIN) 5-325 MG tablet Take 2 tablets by mouth every 6 (six) hours as needed for moderate pain.   Yes [provider]  loratadine (CLARITIN) 10 MG tablet Take 10 mg by mouth daily.    Yes [provider]  LYRICA 25 MG capsule Take 25 mg by mouth 3 (three) times daily.  11/11/17  Yes [provider]  metoprolol succinate (TOPROL-XL) 50 MG 24 hr tablet TAKE 1 TABLET ER 24HR DAILY ORAL Patient taking differently: Take 50 mg by mouth daily.  12/18/17  Yes Emokpae, Courage, MD  nitroGLYCERIN (NITROSTAT) 0.4 MG SL tablet Place 1 tablet (0.4 mg total) under the tongue every 5 (five) minutes x 3 doses as needed for chest pain. Patient taking differently: Place 0.4 mg under the tongue every 5 (five) minutes as needed for chest pain. Maximum 3 doses 03/13/14  Yes Charolette Forward, MD  pantoprazole (PROTONIX) 40 MG tablet Take 40 mg by mouth 2 (two) times daily.    Yes [provider]  potassium chloride SA (K-DUR,KLOR-CON) 20 MEQ tablet Take 1 tablet (20 mEq total) by mouth 2 (two) times daily. 20/35/59  Yes Delora Fuel, MD  oxyCODONE (OXY IR/ROXICODONE) 5 MG immediate release tablet Take 1 tablet (5 mg total) by mouth every 6 (six) hours as needed for moderate pain. Patient not taking: Reported on 09/04/2018 03/10/18   Kalman Drape, PA  spironolactone (ALDACTONE) 25 MG tablet Take 1 tablet (25 mg total) by mouth daily. Patient not taking: Reported on 09/04/2018 12/18/17 12/18/18  Roxan Hockey, MD  metoprolol (LOPRESSOR) 50 MG tablet Take 50 mg by mouth 2 (two) times daily.    12/04/11  [provider]  simvastatin (ZOCOR) 20 MG tablet Take 20 mg by mouth at bedtime.    12/04/11  [provider]    Family History Family History  Problem Relation Age of Onset  . Diabetes Mother   . Hypertension Mother   . Transient ischemic attack Mother   . Seizures Mother   . Dementia Mother   . Other Father        MVA  . Cancer Brother        colon and lung  . Cancer  Maternal Grandmother        colon    Social History Social History   Tobacco Use  . Smoking status: Never Smoker  . Smokeless tobacco: Never Used  Substance Use Topics  . Alcohol use: Yes    Alcohol/week: 0.0 standard drinks    Comment: Occasionally  . Drug use: No     Allergies   Shrimp [shellfish allergy] and Naproxen   Review of Systems Review of Systems  Constitutional: Positive for chills and fever.  Respiratory: Negative for shortness of breath.   Cardiovascular: Negative for chest pain.  Gastrointestinal: Negative for abdominal pain.  Musculoskeletal: Positive for back pain.       Back pain is chronic  Neurological: Negative for headaches.  All other systems reviewed and are negative.    Physical Exam Updated Vital Signs BP (!) 189/81   Pulse (!) 59   Temp 98 F (36.7 C) (Oral)   Resp 16   SpO2 98%   Physical Exam  Constitutional: She is oriented to person, place, and time. She appears well-developed and well-nourished.  HENT:  Head: Normocephalic and atraumatic.  Eyes: No scleral icterus.  Neck: No JVD present.  Cardiovascular: Normal rate and regular rhythm.  Pulmonary/Chest: Effort normal and breath sounds normal.  Abdominal: Soft. Bowel sounds are normal.  Musculoskeletal:       Right hip:  She exhibits tenderness and bony tenderness. She exhibits no swelling, no deformity and no laceration.       Left hip: Normal.       Lumbar back: She exhibits tenderness.  Neurological: She is alert and oriented to person, place, and time.  Skin: Skin is warm and dry. Capillary refill takes less than 2 seconds.  Psychiatric: She has a normal mood and affect. Her behavior is normal. Thought content normal.     ED Treatments / Results  Labs (all labs ordered are listed, but only abnormal results are displayed) Labs Reviewed  CBC - Abnormal; Notable for the following components:      Result Value   Hemoglobin 11.8 (*)    All other components within normal limits  BASIC METABOLIC PANEL - Abnormal; Notable for the following components:   Potassium 3.1 (*)    Calcium 8.6 (*)    All other components within normal limits  SEDIMENTATION RATE  C-REACTIVE PROTEIN    EKG None  Radiology Dg Hip Unilat With Pelvis 2-3 Views Right  Result Date: 09/04/2018 CLINICAL DATA:  Right hip pain. EXAM: DG HIP (WITH OR WITHOUT PELVIS) 2-3V RIGHT COMPARISON:  09/06/2015 FINDINGS: There is no evidence of hip fracture or dislocation. There is no evidence of arthropathy or other focal bone abnormality. IMPRESSION: Negative. Electronically Signed   By: Misty Stanley M.D.   On: 09/04/2018 23:10    Procedures Procedures (including critical care time)  Medications Ordered in ED Medications  pantoprazole (PROTONIX) EC tablet 40 mg (40 mg Oral Given 09/04/18 2217)  potassium chloride SA (K-DUR,KLOR-CON) CR tablet 40 mEq (has no administration in time range)  lidocaine (LIDODERM) 5 % 1 patch (has no administration in time range)  oxyCODONE-acetaminophen (PERCOCET/ROXICET) 5-325 MG per tablet 2 tablet (has no administration in time range)  ketorolac (TORADOL) 15 MG/ML injection 15 mg (15 mg Intravenous Given 09/04/18 2217)     Initial Impression / Assessment and Plan / ED Course  I have reviewed the triage  vital signs and the nursing notes.  Pertinent labs & imaging results that were available during my care  of the patient were reviewed by me and considered in my medical decision making (see chart for details).     Patient presenting with 2 days of acutely worsening right hip pain.  No history of trauma, concern for possible gout versus pseudogout versus septic joint. Less likely AVN.  Will obtain plain films to rule out any type of bony abnormality.  Obtain inflammatory markers to look for signs of inflammation.  Pending these results, consider CT pelvis with contrast to observe enhancing fluid.  Also consider joint aspiration. -Pelvis x-ray -CBC, CMP, ESR, CRP -Toradol x1, PPI  Interval update Continued right hip pain.  CBC, BMP within normal limits.  ESR/CRP pending.  Hip x-ray shows no bony abnormalities. Await inflammatory markers.  Treat pain with oxycodone, lidocaine. Will obtain CT pelvis with contast to observe for effusion and further assess for septic joint. HTN improved with improved pain control.    Final Clinical Impressions(s) / ED Diagnoses   Final diagnoses:  Pain    ED Discharge Orders    None       Bonnita Hollow, MD 09/04/18 2357    Blanchie Dessert, MD 09/06/18 0030

## 2018-09-04 NOTE — ED Notes (Signed)
Main lab called they need a gold top for the CRP

## 2018-09-04 NOTE — ED Notes (Signed)
Patient transported to Xray,.

## 2018-09-05 LAB — SEDIMENTATION RATE: Sed Rate: 4 mm/hr (ref 0–22)

## 2018-09-05 LAB — C-REACTIVE PROTEIN: CRP: <0.8 mg/dL (ref <1.0)

## 2018-09-05 MED ORDER — OXYCODONE-ACETAMINOPHEN 5-325 MG PO TABS: 1.0000 | ORAL_TABLET | ORAL | 0 refills | Status: DC | PRN

## 2018-09-05 MED ORDER — CELECOXIB 100 MG PO CAPS: 100.0000 mg | ORAL_CAPSULE | Freq: Two times a day (BID) | ORAL | 0 refills | Status: DC

## 2018-09-05 NOTE — ED Notes (Signed)
Patient currently at CT scan .  

## 2018-09-05 NOTE — Discharge Instructions (Addendum)
Your x-rays and blood test showed no evidence of infection in your hip joint.  Because of your hip pain is not clear, but it may be related to your ongoing back problems.  Please follow-up with your primary care provider

## 2018-09-05 NOTE — ED Provider Notes (Signed)
Care assumed from Dr. Maryan Rued.  Patient with right hip pain question septic joint pending CT of hip and sed rate and CRP.  CT scan shows no findings to suggest joint infection.  CRP and sedimentation rate are normal.  She did get good pain relief in the emergency department from ketorolac and oxycodone-acetaminophen.  She has GI intolerance for NSAIDs. she is discharged with prescriptions for celecoxib and oxycodone-acetaminophen.  Results for orders placed or performed during the hospital encounter of 09/04/18  CBC  Result Value Ref Range   WBC 8.3 4.0 - 10.5 K/uL   RBC 4.24 3.87 - 5.11 MIL/uL   Hemoglobin 11.8 (L) 12.0 - 15.0 g/dL   HCT 38.0 36.0 - 46.0 %   MCV 89.6 80.0 - 100.0 fL   MCH 27.8 26.0 - 34.0 pg   MCHC 31.1 30.0 - 36.0 g/dL   RDW 14.7 11.5 - 15.5 %   Platelets 238 150 - 400 K/uL   nRBC 0.0 0.0 - 0.2 %  Basic metabolic panel  Result Value Ref Range   Sodium 139 135 - 145 mmol/L   Potassium 3.1 (L) 3.5 - 5.1 mmol/L   Chloride 106 98 - 111 mmol/L   CO2 22 22 - 32 mmol/L   Glucose, Bld 94 70 - 99 mg/dL   BUN 12 6 - 20 mg/dL   Creatinine, Ser 0.69 0.44 - 1.00 mg/dL   Calcium 8.6 (L) 8.9 - 10.3 mg/dL   GFR calc non Af Amer >60 >60 mL/min   GFR calc Af Amer >60 >60 mL/min   Anion gap 11 5 - 15  C-reactive protein  Result Value Ref Range   CRP <0.8 <1.0 mg/dL  Sedimentation rate  Result Value Ref Range   Sed Rate 4 0 - 22 mm/hr   Ct Pelvis W Contrast  Result Date: 09/05/2018 CLINICAL DATA:  57 year old female with swelling hips. No known trauma. EXAM: CT PELVIS WITH CONTRAST TECHNIQUE: Multidetector CT imaging of the pelvis was performed using the standard protocol following the bolus administration of intravenous contrast. CONTRAST:  127mL OMNIPAQUE IOHEXOL 300 MG/ML  SOLN COMPARISON:  Radiograph dated 09/04/2018 and CT of the abdomen pelvis dated 03/06/2018 FINDINGS: Urinary Tract:  No abnormality visualized. Bowel: Postsurgical changes of bowel resection with  anastomotic suture in the left lower abdomen. No bowel dilatation or active inflammation in the pelvis. The appendix is normal. Vascular/Lymphatic: No pathologically enlarged lymph nodes. No significant vascular abnormality seen. Reproductive: Hysterectomy. No pelvic mass. The right ovary is unremarkable. The left ovary is not visualized. Other:  Midline vertical anterior pelvic wall incisional scar. Musculoskeletal: No suspicious bone lesions identified. IMPRESSION: No acute findings.  No fracture or fluid collection. Electronically Signed   By: Anner Crete M.D.   On: 09/05/2018 00:19   Dg Hip Unilat With Pelvis 2-3 Views Right  Result Date: 09/04/2018 CLINICAL DATA:  Right hip pain. EXAM: DG HIP (WITH OR WITHOUT PELVIS) 2-3V RIGHT COMPARISON:  09/06/2015 FINDINGS: There is no evidence of hip fracture or dislocation. There is no evidence of arthropathy or other focal bone abnormality. IMPRESSION: Negative. Electronically Signed   By: Misty Stanley M.D.   On: 09/04/2018 23:10   Images viewed by me.    Delora Fuel, MD 18/84/16 [phone]

## 2018-10-05 ENCOUNTER — Encounter (HOSPITAL_COMMUNITY): Payer: Self-pay | Admitting: Emergency Medicine

## 2018-10-05 ENCOUNTER — Emergency Department (HOSPITAL_COMMUNITY)
Admission: EM | Admit: 2018-10-05 | Discharge: 2018-10-06 | Disposition: A | Discharge status: Home / Self Care | Payer: Medicare Other | Attending: Emergency Medicine | Admitting: Emergency Medicine

## 2018-10-05 ENCOUNTER — Emergency Department (HOSPITAL_COMMUNITY): Payer: Medicare Other

## 2018-10-05 DIAGNOSIS — R1013 Epigastric pain: Secondary | ICD-10-CM | POA: Diagnosis not present

## 2018-10-05 DIAGNOSIS — Z79899 Other long term (current) drug therapy: Secondary | ICD-10-CM | POA: Diagnosis not present

## 2018-10-05 DIAGNOSIS — R0602 Shortness of breath: Secondary | ICD-10-CM | POA: Insufficient documentation

## 2018-10-05 DIAGNOSIS — J40 Bronchitis, not specified as acute or chronic: Secondary | ICD-10-CM | POA: Diagnosis not present

## 2018-10-05 DIAGNOSIS — Z7982 Long term (current) use of aspirin: Secondary | ICD-10-CM | POA: Insufficient documentation

## 2018-10-05 DIAGNOSIS — R0789 Other chest pain: Secondary | ICD-10-CM | POA: Insufficient documentation

## 2018-10-05 DIAGNOSIS — I10 Essential (primary) hypertension: Secondary | ICD-10-CM | POA: Insufficient documentation

## 2018-10-05 DIAGNOSIS — R079 Chest pain, unspecified: Secondary | ICD-10-CM | POA: Diagnosis present

## 2018-10-05 LAB — I-STAT TROPONIN, ED: Troponin i, poc: 0.02 ng/mL (ref 0.00–0.08)

## 2018-10-05 LAB — I-STAT BETA HCG BLOOD, ED (MC, WL, AP ONLY): I-stat hCG, quantitative: <5 m[IU]/mL (ref <5)

## 2018-10-05 LAB — CBC
HCT: 37.5 % (ref 36.0–46.0)
Hemoglobin: 11.9 g/dL — ABNORMAL LOW (ref 12.0–15.0)
MCH: 29 pg (ref 26.0–34.0)
MCHC: 31.7 g/dL (ref 30.0–36.0)
MCV: 91.5 fL (ref 80.0–100.0)
Platelets: 191 10*3/uL (ref 150–400)
RBC: 4.1 MIL/uL (ref 3.87–5.11)
RDW: 14.9 % (ref 11.5–15.5)
WBC: 9.5 10*3/uL (ref 4.0–10.5)
nRBC: 0 % (ref 0.0–0.2)

## 2018-10-05 LAB — BASIC METABOLIC PANEL
Anion gap: 7 (ref 5–15)
BUN: 9 mg/dL (ref 6–20)
CO2: 25 mmol/L (ref 22–32)
Calcium: 8.4 mg/dL — ABNORMAL LOW (ref 8.9–10.3)
Chloride: 108 mmol/L (ref 98–111)
Creatinine, Ser: 0.85 mg/dL (ref 0.44–1.00)
GFR calc Af Amer: >60 mL/min (ref >60)
GFR calc non Af Amer: >60 mL/min (ref >60)
Glucose, Bld: 102 mg/dL — ABNORMAL HIGH (ref 70–99)
Potassium: 3.2 mmol/L — ABNORMAL LOW (ref 3.5–5.1)
Sodium: 140 mmol/L (ref 135–145)

## 2018-10-05 MED ORDER — ONDANSETRON 4 MG PO TBDP
4.0000 mg | ORAL_TABLET | Freq: Once | ORAL | Status: AC
  Administered 2018-10-05: 4 mg via ORAL
  Filled 2018-10-05: qty 1

## 2018-10-05 NOTE — ED Triage Notes (Signed)
Pt reports sudden onset substernal chest pain.  Reports as she was washing dishes she began to fell nauses, dizzy, sweaty and then the chest pain started.

## 2018-10-06 ENCOUNTER — Emergency Department (HOSPITAL_COMMUNITY): Payer: Medicare Other

## 2018-10-06 LAB — I-STAT TROPONIN, ED: Troponin i, poc: 0.02 ng/mL (ref 0.00–0.08)

## 2018-10-06 LAB — HEPATIC FUNCTION PANEL
ALT: 21 U/L (ref 0–44)
AST: 21 U/L (ref 15–41)
Albumin: 3 g/dL — ABNORMAL LOW (ref 3.5–5.0)
Alkaline Phosphatase: 64 U/L (ref 38–126)
Bilirubin, Direct: 0.2 mg/dL (ref 0.0–0.2)
Indirect Bilirubin: 0.2 mg/dL — ABNORMAL LOW (ref 0.3–0.9)
Total Bilirubin: 0.4 mg/dL (ref 0.3–1.2)
Total Protein: 5.9 g/dL — ABNORMAL LOW (ref 6.5–8.1)

## 2018-10-06 LAB — D-DIMER, QUANTITATIVE (NOT AT ARMC): D-Dimer, Quant: 0.54 ug/mL-FEU — ABNORMAL HIGH (ref 0.00–0.50)

## 2018-10-06 LAB — LIPASE, BLOOD: Lipase: 21 U/L (ref 11–51)

## 2018-10-06 MED ORDER — FAMOTIDINE 20 MG PO TABS: 20.0000 mg | ORAL_TABLET | Freq: Two times a day (BID) | ORAL | 0 refills | Status: DC

## 2018-10-06 MED ORDER — IOPAMIDOL (ISOVUE-370) INJECTION 76%
75.0000 mL | Freq: Once | INTRAVENOUS | Status: AC | PRN
  Administered 2018-10-06: 100 mL via INTRAVENOUS

## 2018-10-06 MED ORDER — ACETAMINOPHEN 500 MG PO TABS
1000.0000 mg | ORAL_TABLET | Freq: Once | ORAL | Status: AC
  Administered 2018-10-06: 1000 mg via ORAL
  Filled 2018-10-06: qty 2

## 2018-10-06 MED ORDER — ALBUTEROL SULFATE HFA 108 (90 BASE) MCG/ACT IN AERS
2.0000 | INHALATION_SPRAY | Freq: Once | RESPIRATORY_TRACT | Status: AC
  Administered 2018-10-06: 2 via RESPIRATORY_TRACT
  Filled 2018-10-06: qty 6.7

## 2018-10-06 MED ORDER — IOPAMIDOL (ISOVUE-370) INJECTION 76%
INTRAVENOUS | Status: AC
  Filled 2018-10-06: qty 100

## 2018-10-06 MED ORDER — MORPHINE SULFATE (PF) 4 MG/ML IV SOLN
4.0000 mg | Freq: Once | INTRAVENOUS | Status: AC
  Administered 2018-10-06: 4 mg via INTRAVENOUS
  Filled 2018-10-06: qty 1

## 2018-10-06 MED ORDER — LIDOCAINE VISCOUS HCL 2 % MT SOLN
15.0000 mL | Freq: Once | OROMUCOSAL | Status: AC
  Administered 2018-10-06: 15 mL via ORAL
  Filled 2018-10-06: qty 15

## 2018-10-06 MED ORDER — HYDROCODONE-HOMATROPINE 5-1.5 MG/5ML PO SYRP: 5.0000 mL | ORAL_SOLUTION | Freq: Four times a day (QID) | ORAL | 0 refills | Status: DC | PRN

## 2018-10-06 MED ORDER — BENZONATATE 100 MG PO CAPS: 100.0000 mg | ORAL_CAPSULE | Freq: Three times a day (TID) | ORAL | 0 refills | Status: DC | PRN

## 2018-10-06 MED ORDER — ALUM & MAG HYDROXIDE-SIMETH 200-200-20 MG/5ML PO SUSP
30.0000 mL | Freq: Once | ORAL | Status: AC
  Administered 2018-10-06: 30 mL via ORAL
  Filled 2018-10-06: qty 30

## 2018-10-06 NOTE — ED Provider Notes (Signed)
[facility] EMERGENCY DEPARTMENT Provider Note   CSN: [id number] Arrival date & time: 10/05/18  2024     History   Chief Complaint Chief Complaint  Patient presents with  . Chest Pain  . Dizziness    HPI Jodi Bowman is a 58 y.o. female.  HPI   58 yo F with h/o HTN, HLD, CVA, h/o bowel obstruction sp Ex-Lap, here with chest pain and dizziness. Pt states that over the past 2 days, she has had cough, nasal congestion, and sore throat. She saw her PCP on Monday and was prescribed a Z-Pack. Today, she reports intermittent nausea throughout the day along with intermittent achy, cramp like epigastric and chest pain. She stood up to go to the kitchen today and states that her pain worsened along with dizziness, and she broke out in a sweat. Denies syncope. She sat down and her sx improved. Since then, she's had continues aching, gnawing chest pain, general fatigue, and now fever. Her cough has persisted as well. Denies any alleviating factors. No palpitations. Denies known h/o CAD.  Past Medical History:  Diagnosis Date  . Arthritis   . Calculus of gallbladder without mention of cholecystitis or obstruction   . Dizziness   . Fibromyalgia   . Goiter   . Hypercholesteremia   . Hypertension   . Lower back pain   . Migraine   . Nontoxic uninodular goiter    sees dr vollmer at [facility]  . Obesity   . Sleep apnea    STOPBANG=5  . Stroke [facility]) 2000    Patient Active Problem List   Diagnosis Date Noted  . S/P exploratory laparotomy 03/06/2018  . Bowel obstruction ([place]) 03/06/2018  . Chest pain, rule out acute myocardial infarction 12/17/2017  . Hypokalemia 12/17/2017  . Acute lower UTI 12/17/2017  . Vertigo 04/30/2017  . Small vessel disease, cerebrovascular 04/30/2017  . Chronic low back pain 08/01/2015  . Right hip pain 08/01/2015  . Abnormality of gait 08/01/2015  . Left leg weakness   . Low back pain with radiation   . Syncope 07/30/2014  .  Atypical chest pain 03/12/2014  . Lap chole [facility] April 2013 01/29/2012  . Gallstones 12/04/2011  . Thyroid nodule-non neoplastic goiter by needle aspiration 12/04/2011  . GLUCOSE INTOLERANCE 03/12/2010  . DYSLIPIDEMIA 03/12/2010  . Chronic migraine 03/12/2010  . Carotid stenosis 03/12/2010  . CEREBROVASCULAR ACCIDENT 03/12/2010  . LIPOMA 01/29/2010  . HEADACHE 01/29/2010  . ANKLE INJURY, RIGHT 04/12/2009  . PHARYNGITIS 03/21/2009  . Backache 03/01/2009  . ALLERGIC RHINITIS 03/17/2007  . LOW BACK PAIN 03/17/2007  . Essential hypertension 01/01/2007  . ANXIETY STATE NOS 09/11/2005    Past Surgical History:  Procedure Laterality Date  . ABDOMINAL HYSTERECTOMY    . BOWEL RESECTION N/A 03/06/2018   Procedure: SMALL BOWEL ANASTAMOSIS;  Surgeon: Clovis Riley, MD;  Location: [place];  Service: General;  Laterality: N/A;  . CESAREAN SECTION  yrs ago   done x 2  . CHOLECYSTECTOMY  01/05/2012   Procedure: LAPAROSCOPIC CHOLECYSTECTOMY WITH INTRAOPERATIVE CHOLANGIOGRAM;  Surgeon: Pedro Earls, MD;  Location: [facility];  Service: General;  Laterality: N/A;  . COLONOSCOPY  10/08/2012   Procedure: COLONOSCOPY;  Surgeon: Beryle Beams, MD;  Location: [facility];  Service: Endoscopy;  Laterality: N/A;  . KNEE ARTHROSCOPY  one 1995 and 1 in 1997   both knees done  . LAPAROSCOPY N/A 03/06/2018   Procedure: LAPAROSCOPY DIAGNOSTIC WITH LYSIS OF ADHESIONS;  Surgeon:  Clovis Riley, MD;  Location: [place];  Service: General;  Laterality: N/A;  . LAPAROTOMY N/A 03/06/2018   Procedure: EXPLORATORY LAPAROTOMY, RESECTION OF DISTAL ROUX, CLOSURE OF INTERNAL HERNIA;  Surgeon: Clovis Riley, MD;  Location: [place];  Service: General;  Laterality: N/A;  . surgery for endometriosis  yrs ago  . thryoid biopsy  December 01, 2011    at [facility]     OB History   No obstetric history on file.      Home Medications    Prior to Admission medications   Medication Sig Start Date End Date Taking? Authorizing Provider    aMILoride (MIDAMOR) 5 MG tablet Take 5 mg by mouth daily.    [provider]  amLODipine (NORVASC) 5 MG tablet Take 1 tablet (5 mg total) by mouth daily. 12/19/17   Roxan Hockey, MD  aspirin EC 81 MG tablet Take 1 tablet (81 mg total) by mouth daily. With food 12/18/17 12/18/18  Roxan Hockey, MD  benzonatate (TESSALON) 100 MG capsule Take 1 capsule (100 mg total) by mouth 3 (three) times daily as needed for cough. 10/06/18   Duffy Bruce, MD  celecoxib (CELEBREX) 100 MG capsule Take 1 capsule (100 mg total) by mouth 2 (two) times daily. 75/6/43   Delora Fuel, MD  Cholecalciferol 50000 units capsule Take 50,000 Units by mouth every Friday.    [provider]  DULoxetine (CYMBALTA) 60 MG capsule Take 60 mg by mouth daily. 03/16/17   [provider]  famotidine (PEPCID) 20 MG tablet Take 1 tablet (20 mg total) by mouth 2 (two) times daily for 5 days. 10/06/18 10/11/18  Duffy Bruce, MD  fluticasone (FLONASE) 50 MCG/ACT nasal spray Place 1 spray into both nostrils daily as needed for allergies.  11/08/17   [provider]  folic acid (FOLVITE) 1 MG tablet Take 1 mg by mouth daily. 10/14/17   [provider]  HYDROcodone-acetaminophen (NORCO/VICODIN) 5-325 MG tablet Take 2 tablets by mouth every 6 (six) hours as needed for moderate pain.    [provider]  HYDROcodone-homatropine (HYCODAN) 5-1.5 MG/5ML syrup Take 5 mLs by mouth every 6 (six) hours as needed for cough. 10/06/18   Duffy Bruce, MD  loratadine (CLARITIN) 10 MG tablet Take 10 mg by mouth daily.     [provider]  LYRICA 25 MG capsule Take 25 mg by mouth 3 (three) times daily.  11/11/17   [provider]  metoprolol succinate (TOPROL-XL) 50 MG 24 hr tablet TAKE 1 TABLET ER 24HR DAILY ORAL Patient taking differently: Take 50 mg by mouth daily.  12/18/17   Roxan Hockey, MD  nitroGLYCERIN (NITROSTAT) 0.4 MG SL tablet Place 1 tablet (0.4 mg total) under the tongue  every 5 (five) minutes x 3 doses as needed for chest pain. Patient taking differently: Place 0.4 mg under the tongue every 5 (five) minutes as needed for chest pain. Maximum 3 doses 03/13/14   Charolette Forward, MD  oxyCODONE-acetaminophen (PERCOCET) 5-325 MG tablet Take 1-2 tablets by mouth every 4 (four) hours as needed for moderate pain. 32/9/51   Delora Fuel, MD  pantoprazole (PROTONIX) 40 MG tablet Take 40 mg by mouth 2 (two) times daily.     [provider]  potassium chloride SA (K-DUR,KLOR-CON) 20 MEQ tablet Take 1 tablet (20 mEq total) by mouth 2 (two) times daily. 88/41/66   Delora Fuel, MD  metoprolol (LOPRESSOR) 50 MG tablet Take 50 mg by mouth 2 (two) times daily.  12/04/11  [provider]  simvastatin (ZOCOR) 20 MG tablet Take 20 mg by mouth at bedtime.    12/04/11  [provider]    Family History Family History  Problem Relation Age of Onset  . Diabetes Mother   . Hypertension Mother   . Transient ischemic attack Mother   . Seizures Mother   . Dementia Mother   . Other Father        MVA  . Cancer Brother        colon and lung  . Cancer Maternal Grandmother        colon    Social History Social History   Tobacco Use  . Smoking status: Never Smoker  . Smokeless tobacco: Never Used  Substance Use Topics  . Alcohol use: Yes    Alcohol/week: 0.0 standard drinks    Comment: Occasionally  . Drug use: No     Allergies   Shrimp [shellfish allergy] and Naproxen   Review of Systems Review of Systems  Constitutional: Positive for fatigue. Negative for chills and fever.  HENT: Negative for congestion, rhinorrhea and sore throat.   Eyes: Negative for visual disturbance.  Respiratory: Positive for shortness of breath. Negative for cough and wheezing.   Cardiovascular: Positive for chest pain. Negative for leg swelling.  Gastrointestinal: Negative for abdominal pain, diarrhea, nausea and vomiting.  Genitourinary: Negative for dysuria, flank  pain, vaginal bleeding and vaginal discharge.  Musculoskeletal: Negative for neck pain.  Skin: Negative for rash.  Allergic/Immunologic: Negative for immunocompromised state.  Neurological: Positive for weakness. Negative for syncope and headaches.  Hematological: Does not bruise/bleed easily.  All other systems reviewed and are negative.    Physical Exam Updated Vital Signs BP 133/77   Pulse 84   Temp 100.3 F (37.9 C) (Oral)   Resp (!) 21   Ht 4\' 11"  (1.499 m)   Wt 74.4 kg   SpO2 96%   BMI 33.12 kg/m   Physical Exam Vitals signs and nursing note reviewed.  Constitutional:      General: She is not in acute distress.    Appearance: She is well-developed.  HENT:     Head: Normocephalic and atraumatic.  Eyes:     Conjunctiva/sclera: Conjunctivae normal.  Neck:     Musculoskeletal: Neck supple.  Cardiovascular:     Rate and Rhythm: Normal rate and regular rhythm.     Heart sounds: Normal heart sounds. No murmur. No friction rub.  Pulmonary:     Effort: Pulmonary effort is normal. No respiratory distress.     Breath sounds: Normal breath sounds. No wheezing or rales.  Abdominal:     General: There is no distension.     Palpations: Abdomen is soft.     Tenderness: There is no abdominal tenderness.     Comments: Mild epigastric TTP. No rebound or guarding.  Skin:    General: Skin is warm.     Capillary Refill: Capillary refill takes less than 2 seconds.  Neurological:     Mental Status: She is alert and oriented to person, place, and time.     Motor: No abnormal muscle tone.      ED Treatments / Results  Labs (all labs ordered are listed, but only abnormal results are displayed) Labs Reviewed  BASIC METABOLIC PANEL - Abnormal; Notable for the following components:      Result Value   Potassium 3.2 (*)    Glucose, Bld 102 (*)    Calcium 8.4 (*)  All other components within normal limits  CBC - Abnormal; Notable for the following components:   Hemoglobin  11.9 (*)    All other components within normal limits  D-DIMER, QUANTITATIVE (NOT AT [facility]) - Abnormal; Notable for the following components:   D-Dimer, Quant 0.54 (*)    All other components within normal limits  HEPATIC FUNCTION PANEL - Abnormal; Notable for the following components:   Total Protein 5.9 (*)    Albumin 3.0 (*)    Indirect Bilirubin 0.2 (*)    All other components within normal limits  LIPASE, BLOOD  I-STAT TROPONIN, ED  I-STAT BETA HCG BLOOD, ED ([facility], [facility], [facility] ONLY)  I-STAT TROPONIN, ED    EKG EKG Interpretation  Date/Time:  Tuesday October 05 2018 20:31:57 EST Ventricular Rate:  82 PR Interval:  138 QRS Duration: 76 QT Interval:  384 QTC Calculation: 448 R Axis:   23 Text Interpretation:  Normal sinus rhythm with sinus arrhythmia Nonspecific T wave abnormality Abnormal ECG Since last EKG, TWI more evident in lead III, aVF Otherwise no significant change Confirmed by Duffy Bruce [phone]) on 10/06/2018 1:05:12 AM   Radiology Dg Chest 2 View  Result Date: 10/05/2018 CLINICAL DATA:  Central chest pain with dyspnea and cough. EXAM: CHEST - 2 VIEW COMPARISON:  None. FINDINGS: The heart size and mediastinal contours are within normal limits. Both lungs are clear. The visualized skeletal structures are unremarkable. IMPRESSION: No active cardiopulmonary disease. Electronically Signed   By: Ashley Royalty M.D.   On: 10/05/2018 20:59   Ct Angio Chest Pe W Or Wo Contrast  Result Date: 10/06/2018 CLINICAL DATA:  Intermediate probability pulmonary for embolism. Nausea and dizziness. EXAM: CT ANGIOGRAPHY CHEST CT ABDOMEN AND PELVIS WITH CONTRAST TECHNIQUE: Multidetector CT imaging of the chest was performed using the standard protocol during bolus administration of intravenous contrast. Multiplanar CT image reconstructions and MIPs were obtained to evaluate the vascular anatomy. Multidetector CT imaging of the abdomen and pelvis was performed using the standard protocol during bolus  administration of intravenous contrast. CONTRAST:  100cc ISOVUE-370 IOPAMIDOL (ISOVUE-370) INJECTION 76% COMPARISON:  03/06/2018 abdominal CT.  09/22/2011 chest CT FINDINGS: CTA CHEST FINDINGS Cardiovascular: Satisfactory opacification of the pulmonary arteries to the segmental level. No evidence of pulmonary embolism. Normal heart size. No pericardial effusion. Mediastinum/Nodes: Bilateral thyroid nodules measuring up to 2.9 cm on the right, stable 1,012. Left nodule measures 2 cm and is new from prior Mildly enlarged hilar lymph nodes, likely reactive in this setting. Lungs/Pleura: Generalized bronchitic airway thickening with mild mucoid impaction in the lower lobes. Mild atelectasis. Musculoskeletal: No chest wall abnormality. No acute osseous findings. Review of the MIP images confirms the above findings. CT ABDOMEN and PELVIS FINDINGS Hepatobiliary: No focal liver abnormality.Cholecystectomy which accounts for chronic intrahepatic bile duct prominence. Pancreas: Unremarkable. Spleen: Unremarkable. Adrenals/Urinary Tract: Negative adrenals. No hydronephrosis or stone. Small bilateral renal cysts. Unremarkable bladder. Stomach/Bowel: No obstruction. No appendicitis. Unremarkable gastric bypass Vascular/Lymphatic: No acute vascular abnormality. No mass or adenopathy. Reproductive:Hysterectomy and possible left oophorectomy. Other: No ascites or pneumoperitoneum. Musculoskeletal: No acute abnormalities. Review of the MIP images confirms the above findings. IMPRESSION: Chest CTA: 1. Bronchitis. 2. Negative for pulmonary embolism. 3. Bilateral thyroid nodules, stable on the right and new on the left (2 cm) since 2012. Suggest thyroid ultrasound. Abdomen: 1. No acute finding. 2. Cholecystectomy, hysterectomy, and gastric bypass. Electronically Signed   By: Monte Fantasia M.D.   On: 10/06/2018 04:16   Ct Abdomen Pelvis W Contrast  Result Date: 10/06/2018 CLINICAL DATA:  Intermediate probability pulmonary for  embolism. Nausea and dizziness. EXAM: CT ANGIOGRAPHY CHEST CT ABDOMEN AND PELVIS WITH CONTRAST TECHNIQUE: Multidetector CT imaging of the chest was performed using the standard protocol during bolus administration of intravenous contrast. Multiplanar CT image reconstructions and MIPs were obtained to evaluate the vascular anatomy. Multidetector CT imaging of the abdomen and pelvis was performed using the standard protocol during bolus administration of intravenous contrast. CONTRAST:  100cc ISOVUE-370 IOPAMIDOL (ISOVUE-370) INJECTION 76% COMPARISON:  03/06/2018 abdominal CT.  09/22/2011 chest CT FINDINGS: CTA CHEST FINDINGS Cardiovascular: Satisfactory opacification of the pulmonary arteries to the segmental level. No evidence of pulmonary embolism. Normal heart size. No pericardial effusion. Mediastinum/Nodes: Bilateral thyroid nodules measuring up to 2.9 cm on the right, stable 1,012. Left nodule measures 2 cm and is new from prior Mildly enlarged hilar lymph nodes, likely reactive in this setting. Lungs/Pleura: Generalized bronchitic airway thickening with mild mucoid impaction in the lower lobes. Mild atelectasis. Musculoskeletal: No chest wall abnormality. No acute osseous findings. Review of the MIP images confirms the above findings. CT ABDOMEN and PELVIS FINDINGS Hepatobiliary: No focal liver abnormality.Cholecystectomy which accounts for chronic intrahepatic bile duct prominence. Pancreas: Unremarkable. Spleen: Unremarkable. Adrenals/Urinary Tract: Negative adrenals. No hydronephrosis or stone. Small bilateral renal cysts. Unremarkable bladder. Stomach/Bowel: No obstruction. No appendicitis. Unremarkable gastric bypass Vascular/Lymphatic: No acute vascular abnormality. No mass or adenopathy. Reproductive:Hysterectomy and possible left oophorectomy. Other: No ascites or pneumoperitoneum. Musculoskeletal: No acute abnormalities. Review of the MIP images confirms the above findings. IMPRESSION: Chest CTA: 1.  Bronchitis. 2. Negative for pulmonary embolism. 3. Bilateral thyroid nodules, stable on the right and new on the left (2 cm) since 2012. Suggest thyroid ultrasound. Abdomen: 1. No acute finding. 2. Cholecystectomy, hysterectomy, and gastric bypass. Electronically Signed   By: Monte Fantasia M.D.   On: 10/06/2018 04:16    Procedures Procedures (including critical care time)  Medications Ordered in ED Medications  albuterol (PROVENTIL HFA;VENTOLIN HFA) 108 (90 Base) MCG/ACT inhaler 2 puff (has no administration in time range)  ondansetron (ZOFRAN-ODT) disintegrating tablet 4 mg (4 mg Oral Given 10/05/18 2040)  alum & mag hydroxide-simeth (MAALOX/MYLANTA) 200-200-20 MG/5ML suspension 30 mL (30 mLs Oral Given 10/06/18 0151)    And  lidocaine (XYLOCAINE) 2 % viscous mouth solution 15 mL (15 mLs Oral Given 10/06/18 0151)  morphine 4 MG/ML injection 4 mg (4 mg Intravenous Given 10/06/18 0148)  acetaminophen (TYLENOL) tablet 1,000 mg (1,000 mg Oral Given 10/06/18 0150)  morphine 4 MG/ML injection 4 mg (4 mg Intravenous Given 10/06/18 0315)  iopamidol (ISOVUE-370) 76 % injection 75 mL (100 mLs Intravenous Contrast Given 10/06/18 0325)     Initial Impression / Assessment and Plan / ED Course  I have reviewed the triage vital signs and the nursing notes.  Pertinent labs & imaging results that were available during my care of the patient were reviewed by me and considered in my medical decision making (see chart for details).  Clinical Course as of Oct 07 435  Wed Oct 06, 2018  0222 58 yo F with h/o HTN, HLD, CVA, h/o bowel obstruction sp Ex-Lap here with cough, mild shortness of breath, and pleuritic chest pain.  Patient currently on azithromycin for possible bronchitis.  She appears mildly febrile here.  I suspect this is the etiology of her generalized weakness and chills, as well as mild orthostatic symptoms.  She had no actual syncope.  EKG is nonischemic and troponins are negative x2 despite constant  symptoms, making ACS unlikely.  D-dimer mildly elevated, so CT angios obtained and is negative.  Given her mild epigastric associated tenderness, does suspect this is due to abdominal wall pain, CT scan obtained given her extensive intra-abdominal history, and is fortunately negative.  Her lab work is otherwise very reassuring.  She has mild hypokalemia which is chronic.  EKG intervals are normal.  Will discharge with additional supportive care for likely bronchitis/early pneumonia, antitussives, and outpatient follow-up.   [CI]    Clinical Course User Index [CI] Duffy Bruce, MD    Final Clinical Impressions(s) / ED Diagnoses   Final diagnoses:  Atypical chest pain  Bronchitis    ED Discharge Orders         Ordered    HYDROcodone-homatropine (HYCODAN) 5-1.5 MG/5ML syrup  Every 6 hours PRN     10/06/18 0433    benzonatate (TESSALON) 100 MG capsule  3 times daily PRN     10/06/18 0433    famotidine (PEPCID) 20 MG tablet  2 times daily     10/06/18 0433           Duffy Bruce, MD 10/06/18 [phone]

## 2018-11-05 ENCOUNTER — Emergency Department (HOSPITAL_COMMUNITY)
Admission: EM | Admit: 2018-11-05 | Discharge: 2018-11-05 | Disposition: A | Discharge status: Home / Self Care | Payer: Medicare Other | Attending: Emergency Medicine | Admitting: Emergency Medicine

## 2018-11-05 ENCOUNTER — Encounter (HOSPITAL_COMMUNITY): Payer: Self-pay | Admitting: Emergency Medicine

## 2018-11-05 DIAGNOSIS — Z7982 Long term (current) use of aspirin: Secondary | ICD-10-CM | POA: Insufficient documentation

## 2018-11-05 DIAGNOSIS — Z79899 Other long term (current) drug therapy: Secondary | ICD-10-CM | POA: Insufficient documentation

## 2018-11-05 DIAGNOSIS — I10 Essential (primary) hypertension: Secondary | ICD-10-CM | POA: Diagnosis not present

## 2018-11-05 DIAGNOSIS — M5441 Lumbago with sciatica, right side: Secondary | ICD-10-CM

## 2018-11-05 DIAGNOSIS — R030 Elevated blood-pressure reading, without diagnosis of hypertension: Secondary | ICD-10-CM

## 2018-11-05 DIAGNOSIS — M545 Low back pain: Secondary | ICD-10-CM | POA: Diagnosis present

## 2018-11-05 MED ORDER — LIDOCAINE 5 % EX PTCH: 1.0000 | MEDICATED_PATCH | CUTANEOUS | 0 refills | Status: DC

## 2018-11-05 MED ORDER — LIDOCAINE 5 % EX PTCH
1.0000 | MEDICATED_PATCH | CUTANEOUS | Status: DC
  Administered 2018-11-05: 1 via TRANSDERMAL
  Filled 2018-11-05: qty 1

## 2018-11-05 MED ORDER — DIAZEPAM 5 MG PO TABS
5.0000 mg | ORAL_TABLET | Freq: Once | ORAL | Status: AC
  Administered 2018-11-05: 5 mg via ORAL
  Filled 2018-11-05: qty 1

## 2018-11-05 MED ORDER — DIAZEPAM 2 MG PO TABS: 2.0000 mg | ORAL_TABLET | Freq: Two times a day (BID) | ORAL | 0 refills | Status: AC

## 2018-11-05 MED ORDER — PREDNISONE 20 MG PO TABS
40.0000 mg | ORAL_TABLET | Freq: Once | ORAL | Status: AC
  Administered 2018-11-05: 40 mg via ORAL
  Filled 2018-11-05: qty 2

## 2018-11-05 NOTE — ED Notes (Signed)
Pt. Ambulatory without this techs assistance. Stated the pain in her back was not unbearable but is currently rating it as being an 8/10.

## 2018-11-05 NOTE — ED Provider Notes (Signed)
[place] EMERGENCY DEPARTMENT Provider Note   CSN: [id number] Arrival date & time: 11/05/18  1604     History   Chief Complaint Chief Complaint  Patient presents with  . Back Pain    HPI Jodi Bowman is a 58 y.o. female.  HPI  Patient is a 58 year old female with a history of chronic low back pain, hyperlipidemia, hypertension, fibromyalgia, arthritis presenting for worsening low back pain.  Patient reports it is primarily in the paraspinal regions, particular on the right side.  Patient reports that most of her back pain affects her right side typically.  She reports that the pain is radiating down her right leg.  Patient reports it worsened today.  Patient reports she has chronic numbness in her right foot particularly affecting the first 3 digits.  This is unchanged today.  She denies any weakness in bilateral lower extremities.  She denies any saddle anesthesia, loss of bowel or bladder control, urinary retention with overflow incontinence, fever or chills, history of IVDU, or cancer history.  Patient denies any recent trauma.  Patient is followed by a pain clinic in [place], Dr. Franki Monte.  She reports that 11 days ago she had a discogram to assess her disc levels.  She also had radiographs just this week.  She has been referred to neurosurgery.  Past Medical History:  Diagnosis Date  . Arthritis   . Calculus of gallbladder without mention of cholecystitis or obstruction   . Dizziness   . Fibromyalgia   . Goiter   . Hypercholesteremia   . Hypertension   . Lower back pain   . Migraine   . Nontoxic uninodular goiter    sees dr vollmer at [facility]  . Obesity   . Sleep apnea    STOPBANG=5  . Stroke [facility]) 2000    Patient Active Problem List   Diagnosis Date Noted  . S/P exploratory laparotomy 03/06/2018  . Bowel obstruction ([place]) 03/06/2018  . Chest pain, rule out acute myocardial infarction 12/17/2017  . Hypokalemia 12/17/2017  . Acute  lower UTI 12/17/2017  . Vertigo 04/30/2017  . Small vessel disease, cerebrovascular 04/30/2017  . Chronic low back pain 08/01/2015  . Right hip pain 08/01/2015  . Abnormality of gait 08/01/2015  . Left leg weakness   . Low back pain with radiation   . Syncope 07/30/2014  . Atypical chest pain 03/12/2014  . Lap chole [facility] April 2013 01/29/2012  . Gallstones 12/04/2011  . Thyroid nodule-non neoplastic goiter by needle aspiration 12/04/2011  . GLUCOSE INTOLERANCE 03/12/2010  . DYSLIPIDEMIA 03/12/2010  . Chronic migraine 03/12/2010  . Carotid stenosis 03/12/2010  . CEREBROVASCULAR ACCIDENT 03/12/2010  . LIPOMA 01/29/2010  . HEADACHE 01/29/2010  . ANKLE INJURY, RIGHT 04/12/2009  . PHARYNGITIS 03/21/2009  . Backache 03/01/2009  . ALLERGIC RHINITIS 03/17/2007  . LOW BACK PAIN 03/17/2007  . Essential hypertension 01/01/2007  . ANXIETY STATE NOS 09/11/2005    Past Surgical History:  Procedure Laterality Date  . ABDOMINAL HYSTERECTOMY    . BOWEL RESECTION N/A 03/06/2018   Procedure: SMALL BOWEL ANASTAMOSIS;  Surgeon: Clovis Riley, MD;  Location: [place];  Service: General;  Laterality: N/A;  . CESAREAN SECTION  yrs ago   done x 2  . CHOLECYSTECTOMY  01/05/2012   Procedure: LAPAROSCOPIC CHOLECYSTECTOMY WITH INTRAOPERATIVE CHOLANGIOGRAM;  Surgeon: Pedro Earls, MD;  Location: [facility];  Service: General;  Laterality: N/A;  . COLONOSCOPY  10/08/2012   Procedure: COLONOSCOPY;  Surgeon: Beryle Beams,  MD;  Location: [facility];  Service: Endoscopy;  Laterality: N/A;  . KNEE ARTHROSCOPY  one 1995 and 1 in 1997   both knees done  . LAPAROSCOPY N/A 03/06/2018   Procedure: LAPAROSCOPY DIAGNOSTIC WITH LYSIS OF ADHESIONS;  Surgeon: Clovis Riley, MD;  Location: [place];  Service: General;  Laterality: N/A;  . LAPAROTOMY N/A 03/06/2018   Procedure: EXPLORATORY LAPAROTOMY, RESECTION OF DISTAL ROUX, CLOSURE OF INTERNAL HERNIA;  Surgeon: Clovis Riley, MD;  Location: [place];  Service: General;   Laterality: N/A;  . surgery for endometriosis  yrs ago  . thryoid biopsy  December 01, 2011    at [facility]     OB History   No obstetric history on file.      Home Medications    Prior to Admission medications   Medication Sig Start Date End Date Taking? Authorizing Provider  aMILoride (MIDAMOR) 5 MG tablet Take 5 mg by mouth daily.    [provider]  amLODipine (NORVASC) 5 MG tablet Take 1 tablet (5 mg total) by mouth daily. 12/19/17   Roxan Hockey, MD  aspirin EC 81 MG tablet Take 1 tablet (81 mg total) by mouth daily. With food 12/18/17 12/18/18  Roxan Hockey, MD  benzonatate (TESSALON) 100 MG capsule Take 1 capsule (100 mg total) by mouth 3 (three) times daily as needed for cough. 10/06/18   Duffy Bruce, MD  celecoxib (CELEBREX) 100 MG capsule Take 1 capsule (100 mg total) by mouth 2 (two) times daily. 95/2/84   Delora Fuel, MD  Cholecalciferol 50000 units capsule Take 50,000 Units by mouth every Friday.    [provider]  diazepam (VALIUM) 2 MG tablet Take 1 tablet (2 mg total) by mouth 2 (two) times daily for 10 days. 11/05/18 11/15/18  Langston Masker B, PA-C  DULoxetine (CYMBALTA) 60 MG capsule Take 60 mg by mouth daily. 03/16/17   [provider]  famotidine (PEPCID) 20 MG tablet Take 1 tablet (20 mg total) by mouth 2 (two) times daily for 5 days. 10/06/18 10/11/18  Duffy Bruce, MD  fluticasone (FLONASE) 50 MCG/ACT nasal spray Place 1 spray into both nostrils daily as needed for allergies.  11/08/17   [provider]  folic acid (FOLVITE) 1 MG tablet Take 1 mg by mouth daily. 10/14/17   [provider]  HYDROcodone-acetaminophen (NORCO/VICODIN) 5-325 MG tablet Take 2 tablets by mouth every 6 (six) hours as needed for moderate pain.    [provider]  HYDROcodone-homatropine (HYCODAN) 5-1.5 MG/5ML syrup Take 5 mLs by mouth every 6 (six) hours as needed for cough. 10/06/18   Duffy Bruce, MD  lidocaine (LIDODERM) 5 % Place 1 patch  onto the skin daily. Remove & Discard patch within 12 hours or as directed by MD 11/05/18   Langston Masker B, PA-C  loratadine (CLARITIN) 10 MG tablet Take 10 mg by mouth daily.     [provider]  LYRICA 25 MG capsule Take 25 mg by mouth 3 (three) times daily.  11/11/17   [provider]  metoprolol succinate (TOPROL-XL) 50 MG 24 hr tablet TAKE 1 TABLET ER 24HR DAILY ORAL Patient taking differently: Take 50 mg by mouth daily.  12/18/17   Roxan Hockey, MD  nitroGLYCERIN (NITROSTAT) 0.4 MG SL tablet Place 1 tablet (0.4 mg total) under the tongue every 5 (five) minutes x 3 doses as needed for chest pain. Patient taking differently: Place 0.4 mg under the tongue every 5 (five) minutes as needed for chest  pain. Maximum 3 doses 03/13/14   Charolette Forward, MD  oxyCODONE-acetaminophen (PERCOCET) 5-325 MG tablet Take 1-2 tablets by mouth every 4 (four) hours as needed for moderate pain. 89/3/81   Delora Fuel, MD  pantoprazole (PROTONIX) 40 MG tablet Take 40 mg by mouth 2 (two) times daily.     [provider]  potassium chloride SA (K-DUR,KLOR-CON) 20 MEQ tablet Take 1 tablet (20 mEq total) by mouth 2 (two) times daily. 01/75/10   Delora Fuel, MD  metoprolol (LOPRESSOR) 50 MG tablet Take 50 mg by mouth 2 (two) times daily.    12/04/11  [provider]  simvastatin (ZOCOR) 20 MG tablet Take 20 mg by mouth at bedtime.    12/04/11  [provider]    Family History Family History  Problem Relation Age of Onset  . Diabetes Mother   . Hypertension Mother   . Transient ischemic attack Mother   . Seizures Mother   . Dementia Mother   . Other Father        MVA  . Cancer Brother        colon and lung  . Cancer Maternal Grandmother        colon    Social History Social History   Tobacco Use  . Smoking status: Never Smoker  . Smokeless tobacco: Never Used  Substance Use Topics  . Alcohol use: Yes    Alcohol/week: 0.0 standard drinks    Comment:  Occasionally  . Drug use: No     Allergies   Shrimp [shellfish allergy] and Naproxen   Review of Systems Review of Systems  Gastrointestinal: Negative for abdominal pain.  Musculoskeletal: Positive for arthralgias and back pain. Negative for gait problem and neck pain.  Allergic/Immunologic: Negative for immunocompromised state.  Neurological: Positive for numbness. Negative for weakness.       Unchanged numbness in right toes from baseline x months per patient.      Physical Exam Updated Vital Signs BP (!) 148/70 (BP Location: Right Arm)   Pulse 86   Temp 98.3 F (36.8 C) (Oral)   Resp 17   SpO2 100%   Physical Exam Vitals signs and nursing note reviewed.  Constitutional:      General: She is not in acute distress.    Appearance: She is well-developed. She is not diaphoretic.     Comments: Sitting comfortably in bed.  HENT:     Head: Normocephalic and atraumatic.  Eyes:     General:        Right eye: No discharge.        Left eye: No discharge.     Conjunctiva/sclera: Conjunctivae normal.     Comments: EOMs normal to gross examination.  Neck:     Musculoskeletal: Normal range of motion.  Cardiovascular:     Rate and Rhythm: Normal rate and regular rhythm.     Comments: Intact, 2+ DP pulses bilaterally. Abdominal:     General: There is no distension.  Musculoskeletal:     Comments: Spine Exam: Inspection/Palpation: No midline tenderness of cervical, thoracic, or lumbar spine.  Patient has diffuse paraspinal muscular tenderness of the entire lumbar spine, particularly on right.  Patient is also reporting pain in bilateral hips with palpation.  Strength: 5/5 throughout LE bilaterally (hip flexion/extension, adduction/abduction; knee flexion/extension; foot dorsiflexion/plantarflexion, inversion/eversion; great toe inversion) Sensation: Intact to light touch in proximal and distal LE bilaterally Reflexes: 2+ quadriceps and achilles reflexes Patient has antalgic  gait, favoring right.   Skin:  General: Skin is warm and dry.  Neurological:     Mental Status: She is alert.     Comments: Cranial nerves intact to gross observation. Patient moves extremities without difficulty.  Psychiatric:        Behavior: Behavior normal.        Thought Content: Thought content normal.        Judgment: Judgment normal.      ED Treatments / Results  Labs (all labs ordered are listed, but only abnormal results are displayed) Labs Reviewed - No data to display  EKG None  Radiology No results found.  Procedures Procedures (including critical care time)  Medications Ordered in ED Medications  diazepam (VALIUM) tablet 5 mg (5 mg Oral Given 11/05/18 1740)  predniSONE (DELTASONE) tablet 40 mg (40 mg Oral Given 11/05/18 1739)     Initial Impression / Assessment and Plan / ED Course  I have reviewed the triage vital signs and the nursing notes.  Pertinent labs & imaging results that were available during my care of the patient were reviewed by me and considered in my medical decision making (see chart for details).  Clinical Course as of Nov 06 2  Fri Nov 05, 2018  1740 Patient verbally verified a safe ride from the ED. Proceeded with prescribing valium for pain/relaxtion/muscle relaxation in the ED.   [AM]    Clinical Course User Index [AM] Albesa Seen, PA-C    Patient nontoxic-appearing, afebrile, neurovascular intact in bilateral lower extremities.  Examination consistent with patient's report of her chronic pain and numbness in right foot.  No red flag signs today.  No cauda equina symptoms. Patient did have discogram 11 days ago, and details of this procedure were reviewed.  Patient did have needles inserted into the spine, but she is not having any signs or symptoms of discitis.    Patient has chronic pain management.  They have performed imaging just this week.  She has had no interim trauma.  Do not feel that further imaging is needed.   Patient is on chronic opioid pain therapy.  She is not on any other sedative medications.  Will prescribe a short course of low-dose Valium for her symptoms in addition to lidocaine patch.  She was given explicit return precautions for any increasing pain, bilateral weakness or numbness, sudden visual loss of bowel bladder control or fever or chills with her symptoms.  Patient is in understanding and agrees with the plan of care.  This is a supervised visit with Dr. Quintella Reichert. Evaluation, management, and discharge planning discussed with this attending physician.  Final Clinical Impressions(s) / ED Diagnoses   Final diagnoses:  Acute right-sided low back pain with right-sided sciatica  Elevated blood pressure reading    ED Discharge Orders         Ordered    diazepam (VALIUM) 2 MG tablet  2 times daily     11/05/18 2035    lidocaine (LIDODERM) 5 %  Every 24 hours     11/05/18 2035           Tamala Julian 11/06/18 0005    Quintella Reichert, MD 11/06/18 1504

## 2018-11-05 NOTE — Discharge Instructions (Signed)
Please see the information and instructions below regarding your visit.  Your diagnoses today include:  1. Acute right-sided low back pain with right-sided sciatica    About diagnosis. Most episodes of acute low back pain are self-limited. Your exam was reassuring today that the source of your pain is not affecting the spinal cord and nerves that originate in the spinal cord.   If you have a history of disc herniation or arthritis in your spine, the nerves exiting the spine on one side get inflamed. This can cause severe pain. We call this radiculopathy. We do not always know what causes the sudden inflammation.  Tests performed today include: See side panel of your discharge paperwork for testing performed today. Vital signs are listed at the bottom of these instructions.   Medications prescribed:    Take any prescribed medications only as prescribed, and any over the counter medications only as directed on the packaging.  You may take the Valium twice a day.  This medication can make you sleepy so please do not drive, drink alcohol or operate machinery while taking this medication.  It is meant to be muscle relaxing.  Please do not take any extra pain medication while taking this medication.  Home care instructions:   Low back pain gets worse the longer you stay stationary. Please keep moving and walking as tolerated. There are exercises included in this packet to perform as tolerated for your low back pain.   Apply heat to the areas that are painful. Avoid twisting or bending your trunk to lift something. Do not lift anything above 25 lbs while recovering from this flare of low back pain.  Please follow any educational materials contained in this packet.   Follow-up instructions: Please follow-up with your pain management physician for further evaluation of your symptoms if they are not completely improved.   Return instructions:  Please return to the Emergency Department if you  experience worsening symptoms.  Please return for any fever or chills in the setting of your back pain, weakness in the muscles of the legs, numbness in your legs and feet that is new or changing, numbness in the area where you wipe, retention of your urine, loss of bowel or bladder control, or problems with walking. Please return if you have any other emergent concerns.  Additional Information:   Your vital signs today were: BP (!) 148/70 (BP Location: Right Arm)    Pulse 86    Temp 98.3 F (36.8 C) (Oral)    Resp 17    SpO2 100%  If your blood pressure (BP) was elevated on multiple readings during this visit above 130 for the top number or above 80 for the bottom number, please have this repeated by your primary care provider within one month. --------------  Thank you for allowing [place] to participate in your care today.

## 2018-11-05 NOTE — ED Notes (Signed)
Pt ambulatory with no assistance. Pt stated her pain had not worsened nor improved. Observed by Yetta Flock, PA.

## 2018-11-05 NOTE — ED Triage Notes (Signed)
Pt. Stated, I have chronic back pain and its just worse today.

## 2018-11-17 IMAGING — CT CT ANGIO CHEST-ABD-PELV FOR DISSECTION W/ AND WO/W CM
2 of 7 series · 14 of 46 positions shown, 16 images · IV contrast (iopamidol)
Comparison: None.

CLINICAL DATA: Abdominal pain, back pain and hypertension

EXAM:
CT ANGIOGRAPHY CHEST, ABDOMEN AND PELVIS
TECHNIQUE: Multidetector CT imaging through the chest, abdomen and pelvis was
performed using the standard protocol during bolus administration of
intravenous contrast. Multiplanar reconstructed images and MIPs were
obtained and reviewed to evaluate the vascular anatomy.
CONTRAST:  100mL [id number] IOPAMIDOL (K0JBJI-DE9) INJECTION 76%

[Series 5: axial arterial · axial · arterial · 0.86mm/px · z∈[+985,+1483]mm · 11 of 190 slices shown, 13 images]
[im 12/190  soft-tissue]
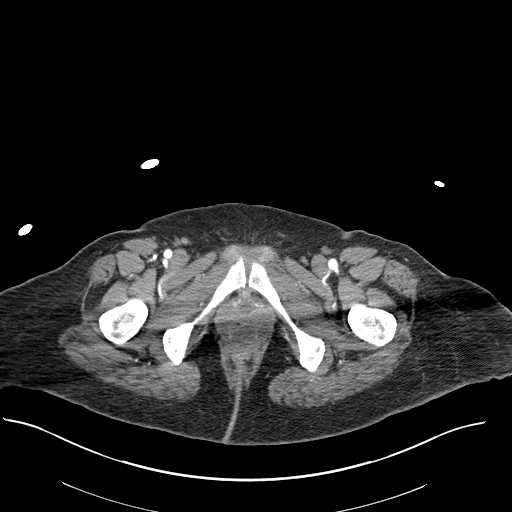
[im 12/190  bone]
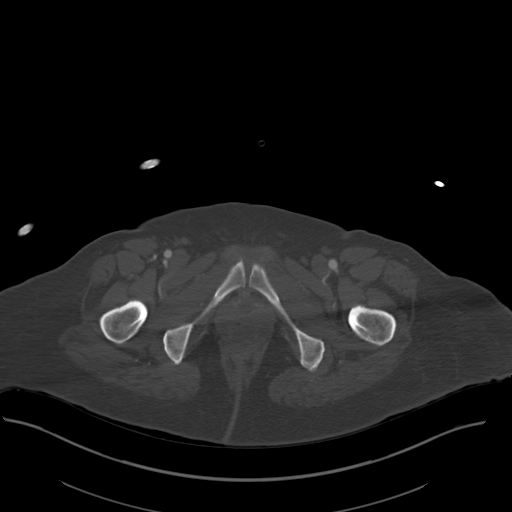
[im 36/190  soft-tissue]
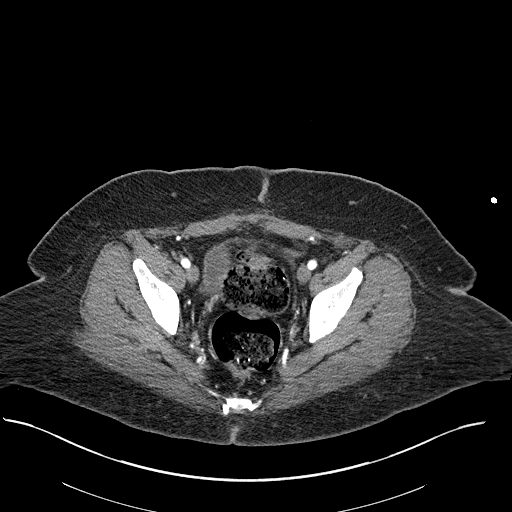
[im 48/190  soft-tissue]
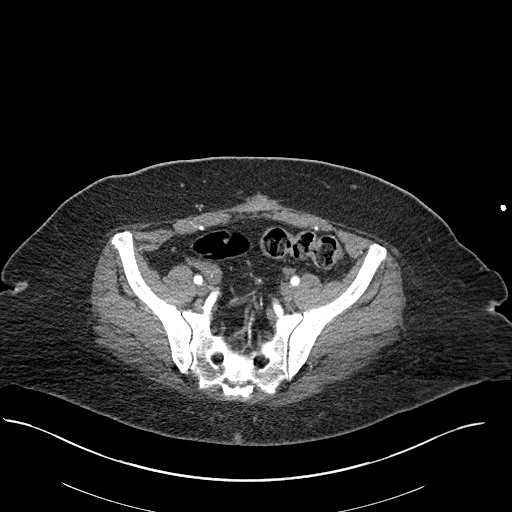
[im 60/190  soft-tissue]
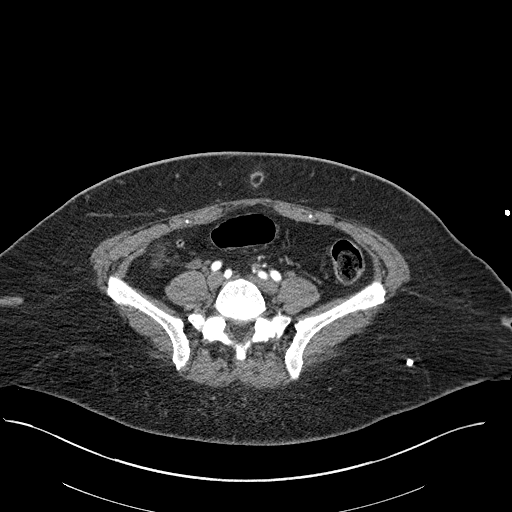
[im 83/190  soft-tissue]
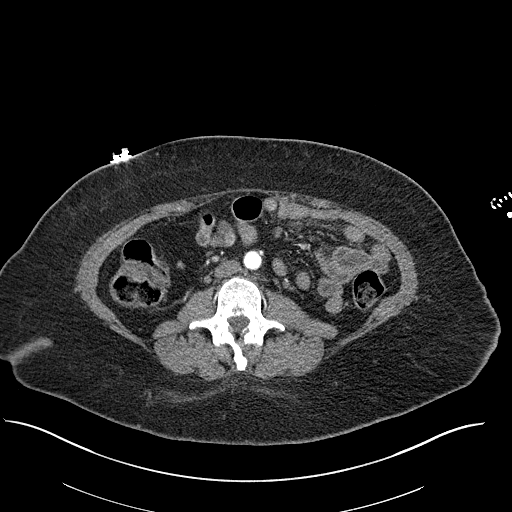
[im 95/190  soft-tissue]
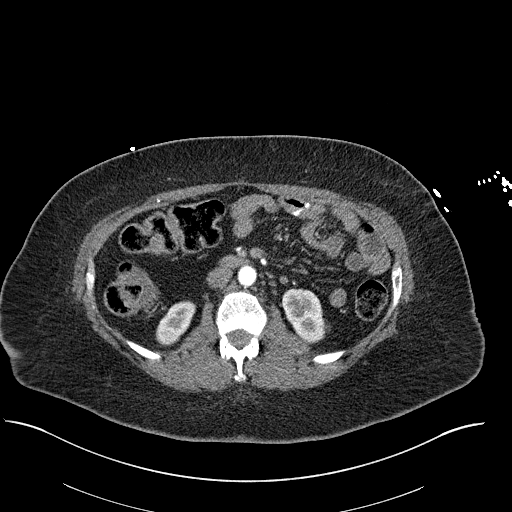
[im 107/190  soft-tissue]
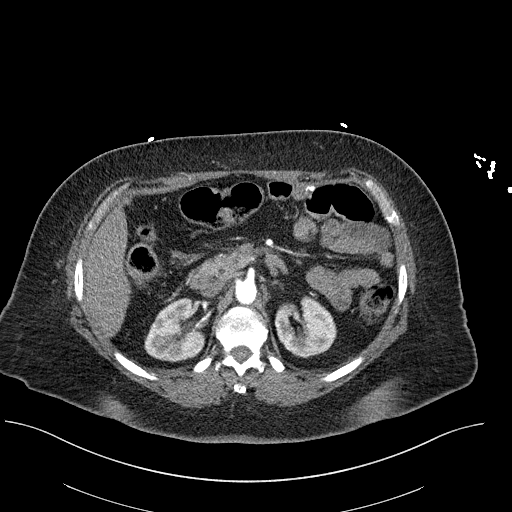
[im 130/190  soft-tissue]
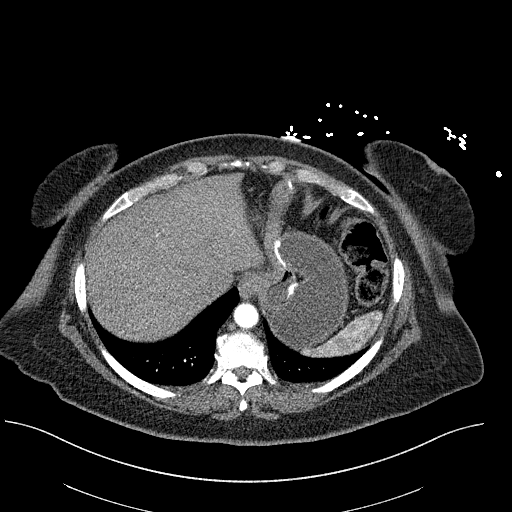
[im 142/190  soft-tissue]
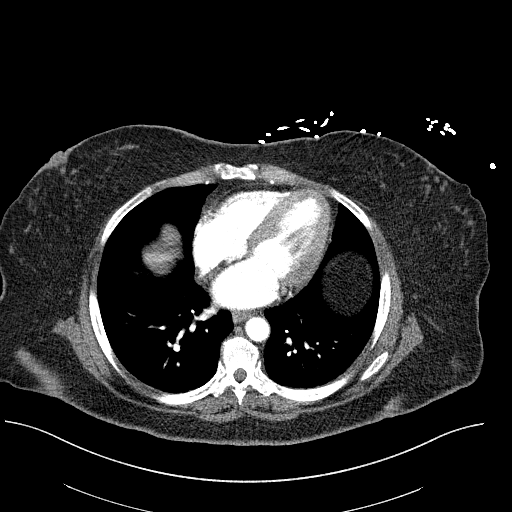
[im 142/190  bone]
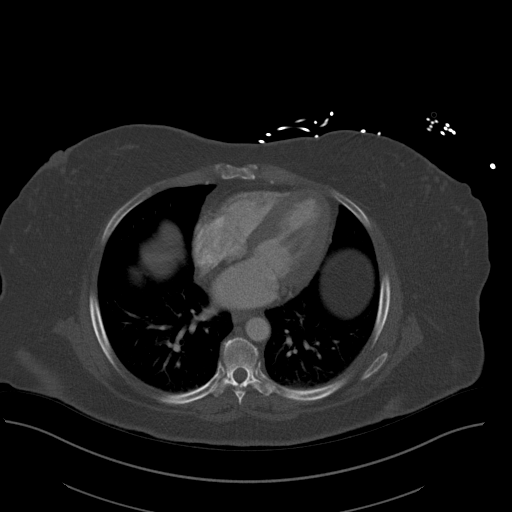
[im 154/190  soft-tissue]
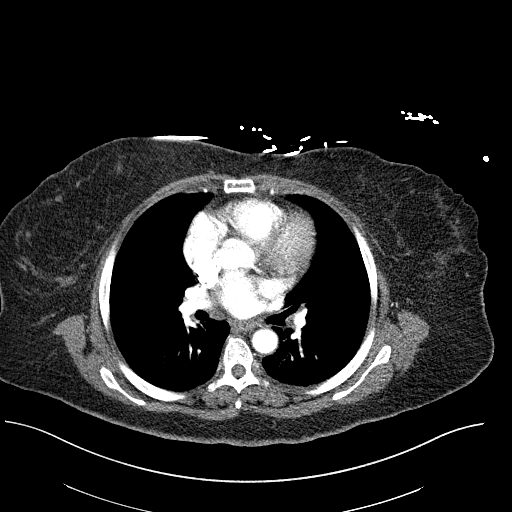
[im 178/190  soft-tissue]
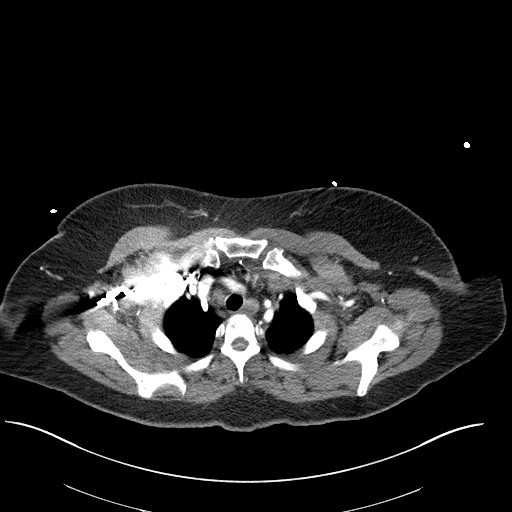

[Series 8: coronals · coronal · 0.81mm/px · 3 of 152 slices shown]
[im 38/152  soft-tissue]
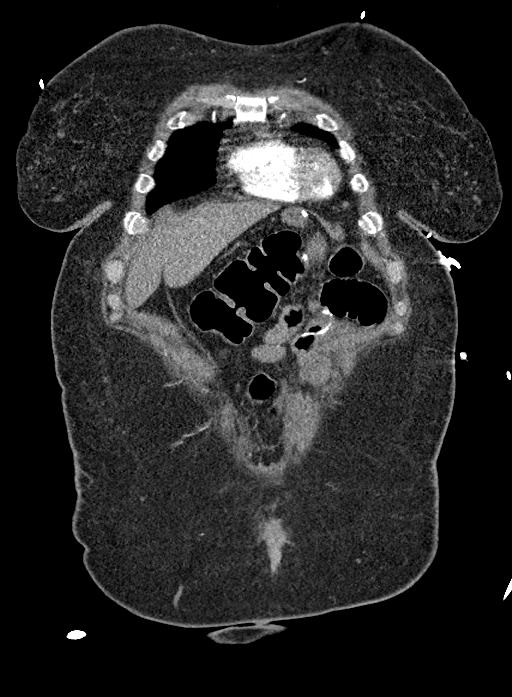
[im 76/152  soft-tissue]
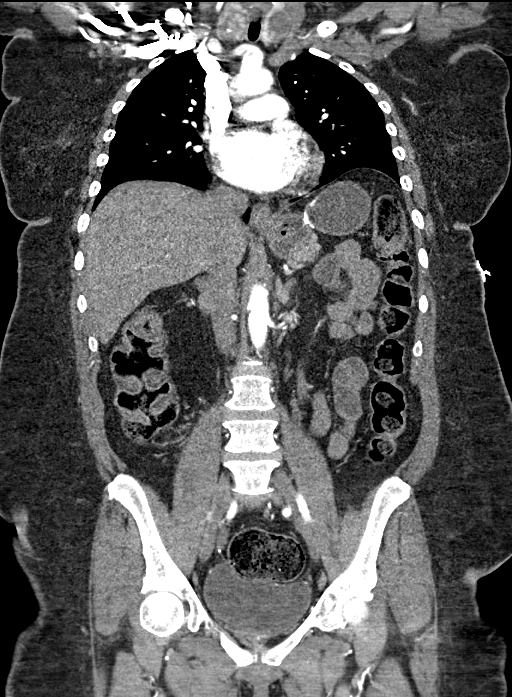
[im 114/152  soft-tissue]
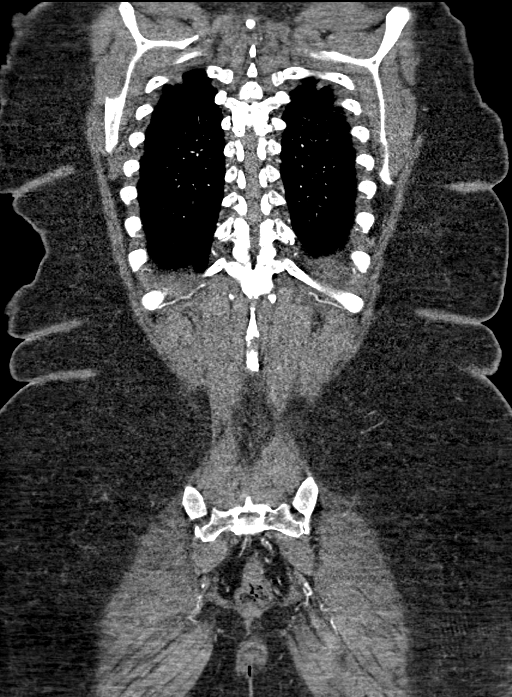

[14 of 46 positions shown; findings below may reference images not displayed]

FINDINGS: CTA CHEST FINDINGS

Cardiovascular: The heart size is normal. There is nopericardial
effusion.

The course and caliber of the thoracic aorta are normal. There is no
aortic atherosclerotic calcification. Precontrast images show no
aortic intramural hematoma. There is no blood pool, dissection or
penetrating ulcer demonstrated on arterial phase postcontrast
imaging. Normal variant aortic arch branching pattern with the
brachiocephalic and left common carotid arteries sharing a common
origin. The proximal arch vessels are widely patent.

The central pulmonary arteries are normal.

Mediastinum/Nodes: No mediastinal, hilar or axillary
lymphadenopathy. The visualized thyroid and thoracic esophageal
course are unremarkable.

Lungs/Pleura: No pulmonary nodules or masses. No pleural effusion or
pneumothorax. No focal airspace consolidation. No focal pleural
abnormality.

Musculoskeletal: No chest wall abnormality. No acute osseous
findings.

Review of the MIP images confirms the above findings.

CTA ABDOMEN AND PELVIS FINDINGS

VASCULAR

Aorta: Normal caliber aorta without aneurysm, dissection, vasculitis
or hemodynamically significant stenosis. There is no aortic
atherosclerosis.

Celiac: No aneurysm, dissection or hemodynamically significant
stenosis. Normal branching pattern.

SMA: Widely patent without dissection or stenosis.

Renals: Single renal arteries bilaterally. No aneurysm, dissection,
stenosis or evidence of fibromuscular dysplasia.

IMA: Patent without abnormality.

Inflow: Minimal atherosclerotic calcification without stenosis or
other abnormality.

Veins: Normal course and caliber of the major veins. Assessment is
otherwise limited by the arterial dominant contrast phase.

Review of the MIP images confirms the above findings.

NON-VASCULAR

Hepatobiliary: Normal hepatic contours and density. No visible
biliary dilatation. Status post cholecystectomy.

Pancreas: Normal contours without ductal dilatation. No
peripancreatic fluid collection.

Spleen: Normal arterial phase splenic enhancement pattern.

Adrenals/Urinary Tract:

--Adrenal glands: Intermediate attenuation right adrenal nodule
measuring 1.4 cm.

--Right kidney/ureter: Right upper pole renal cyst measures 2.9 cm.

--Left kidney/ureter: No hydronephrosis or perinephric stranding. No
nephrolithiasis. No obstructing ureteral stones.

--Urinary bladder: Unremarkable.

Stomach/Bowel:

--Stomach/Duodenum: Status post gastric bypass.

--Small bowel: No dilatation or inflammation.

--Colon: No focal abnormality.

--Appendix: Normal.

Lymphatic:  No abdominal or pelvic lymphadenopathy.

Reproductive: Status post hysterectomy. No adnexal mass.

Musculoskeletal. No bony spinal canal stenosis or focal osseous
abnormality.

Other: None.

Review of the MIP images confirms the above findings.
IMPRESSION: No acute aortic syndrome or other acute abnormality.

## 2018-11-17 IMAGING — DX DG CHEST 1V PORT
1 series · 1 of 1 positions shown · non-contrast
Comparison: Chest radiograph 10/03/2016

CLINICAL DATA: Dyspnea.  Abdominal pain.  Back pain.

EXAM:
PORTABLE CHEST 1 VIEW

[chest ap]
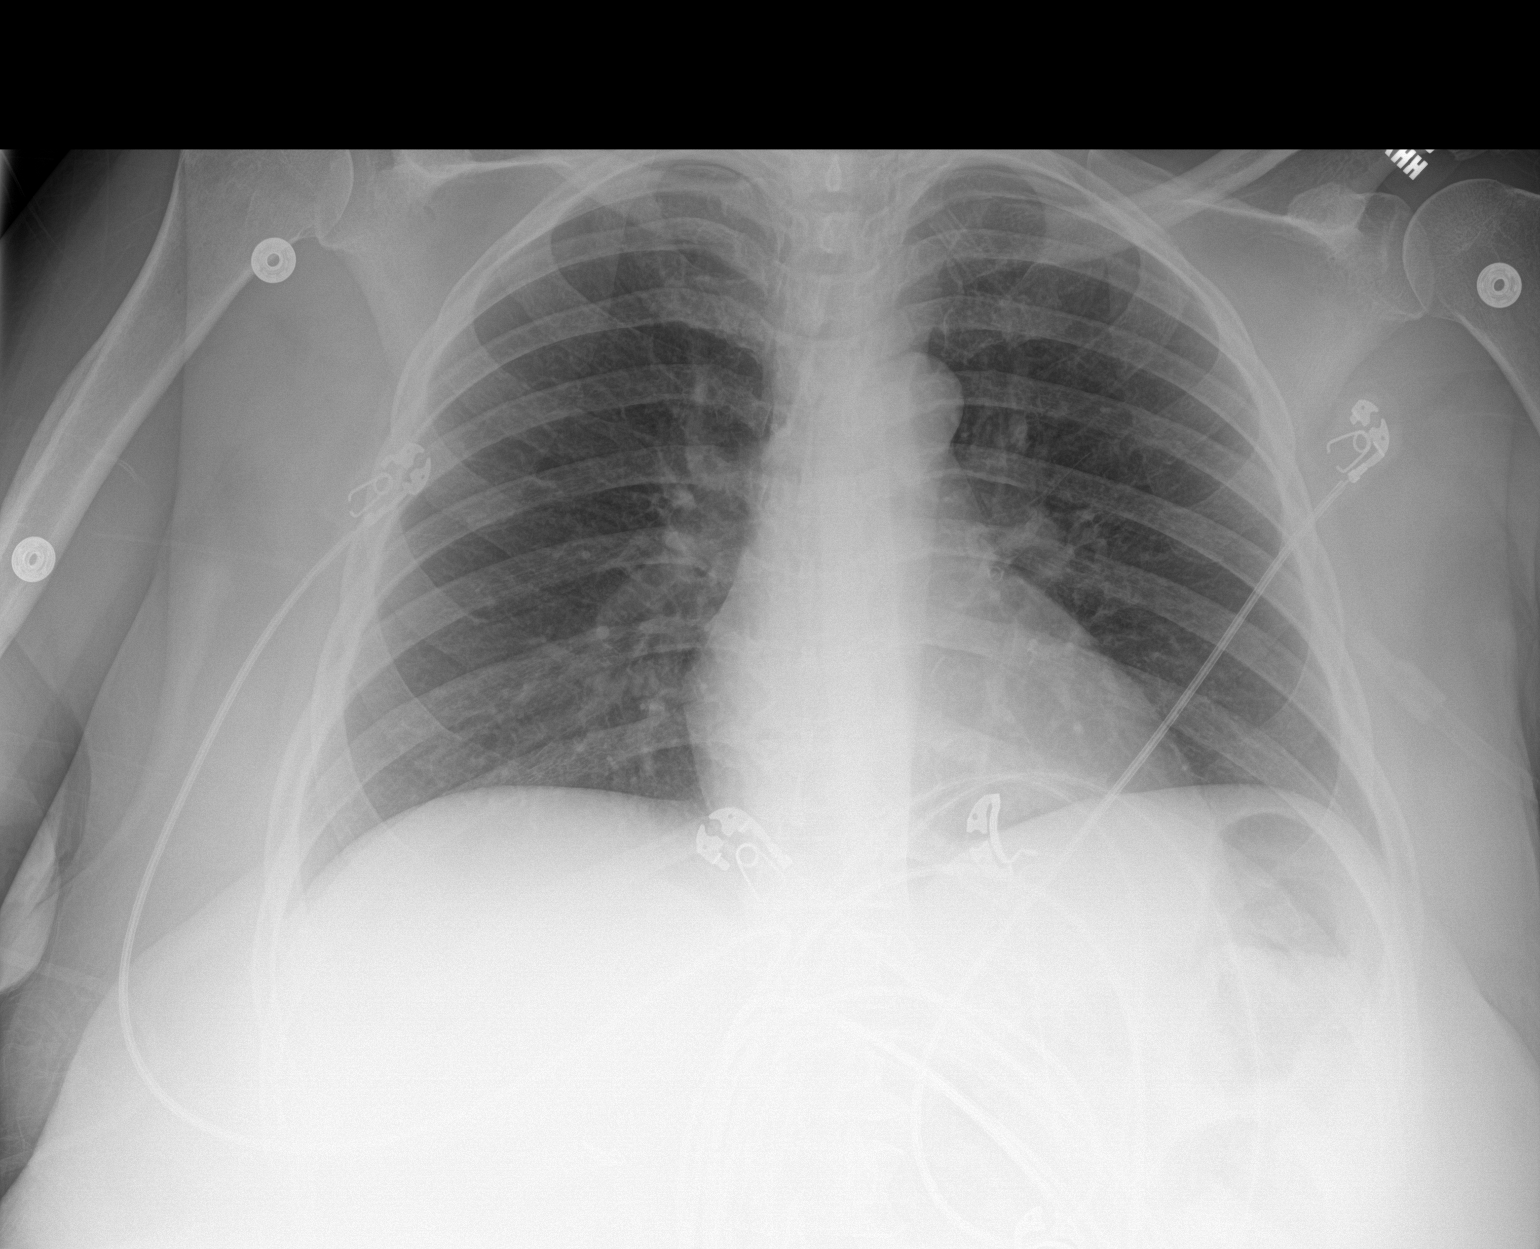

[1 of 1 positions shown; findings below may reference images not displayed]

FINDINGS: The cardiomediastinal contours are normal. The lungs are clear.
Pulmonary vasculature is normal. No consolidation, pleural effusion,
or pneumothorax. No acute osseous abnormalities are seen. Minimal
proliferative change at the right acromioclavicular joint.
IMPRESSION: No acute abnormality.

## 2019-01-30 ENCOUNTER — Emergency Department (HOSPITAL_COMMUNITY)
Admission: EM | Admit: 2019-01-30 | Discharge: 2019-01-30 | Disposition: A | Discharge status: Home / Self Care | Payer: Medicare Other | Attending: Emergency Medicine | Admitting: Emergency Medicine

## 2019-01-30 ENCOUNTER — Other Ambulatory Visit: Payer: Self-pay

## 2019-01-30 ENCOUNTER — Encounter (HOSPITAL_COMMUNITY): Payer: Self-pay | Admitting: Emergency Medicine

## 2019-01-30 DIAGNOSIS — I10 Essential (primary) hypertension: Secondary | ICD-10-CM | POA: Diagnosis not present

## 2019-01-30 DIAGNOSIS — M545 Low back pain: Secondary | ICD-10-CM | POA: Diagnosis present

## 2019-01-30 DIAGNOSIS — M5441 Lumbago with sciatica, right side: Secondary | ICD-10-CM | POA: Diagnosis not present

## 2019-01-30 DIAGNOSIS — G8929 Other chronic pain: Secondary | ICD-10-CM

## 2019-01-30 DIAGNOSIS — Z79899 Other long term (current) drug therapy: Secondary | ICD-10-CM | POA: Insufficient documentation

## 2019-01-30 MED ORDER — DICLOFENAC SODIUM 1 % TD GEL: 4.0000 g | Freq: Four times a day (QID) | TRANSDERMAL | 0 refills | Status: DC

## 2019-01-30 MED ORDER — METHOCARBAMOL 500 MG PO TABS: 500.0000 mg | ORAL_TABLET | Freq: Two times a day (BID) | ORAL | 0 refills | Status: DC

## 2019-01-30 MED ORDER — HYDROMORPHONE HCL 1 MG/ML IJ SOLN
1.0000 mg | Freq: Once | INTRAMUSCULAR | Status: AC
  Administered 2019-01-30: 1 mg via INTRAMUSCULAR
  Filled 2019-01-30: qty 1

## 2019-01-30 NOTE — ED Provider Notes (Signed)
[place] EMERGENCY DEPARTMENT Provider Note   CSN: [id number] Arrival date & time: 01/30/19  2019    History   Chief Complaint No chief complaint on file.   HPI Jodi Bowman is a 58 y.o. female.     HPI  Jodi Bowman is a 58 y.o. female, with a history of chronic back pain, HTN, hypercholesterolemia, fibromyalgia, presenting to the ED with chronic lower back pain with acute flare starting today.  Pain is severe, sharp, radiating down the right leg, consistent with previous flares.  She is under the care of pain management and regularly takes hydrocodone for pain.  She is also managed by Dr. Annette Stable, neurosurgeon, and has an appointment set up for May 5. Denies fever/chills, nausea/vomiting, numbness, weakness, changes in bowel or bladder function, abdominal pain, falls/trauma, or any other complaints.   Past Medical History:  Diagnosis Date  . Arthritis   . Calculus of gallbladder without mention of cholecystitis or obstruction   . Dizziness   . Fibromyalgia   . Goiter   . Hypercholesteremia   . Hypertension   . Lower back pain   . Migraine   . Nontoxic uninodular goiter    sees dr vollmer at [facility]  . Obesity   . Sleep apnea    STOPBANG=5  . Stroke [facility]) 2000    Patient Active Problem List   Diagnosis Date Noted  . S/P exploratory laparotomy 03/06/2018  . Bowel obstruction ([place]) 03/06/2018  . Chest pain, rule out acute myocardial infarction 12/17/2017  . Hypokalemia 12/17/2017  . Acute lower UTI 12/17/2017  . Vertigo 04/30/2017  . Small vessel disease, cerebrovascular 04/30/2017  . Chronic low back pain 08/01/2015  . Right hip pain 08/01/2015  . Abnormality of gait 08/01/2015  . Left leg weakness   . Low back pain with radiation   . Syncope 07/30/2014  . Atypical chest pain 03/12/2014  . Lap chole [facility] April 2013 01/29/2012  . Gallstones 12/04/2011  . Thyroid nodule-non neoplastic goiter by needle aspiration 12/04/2011  .  GLUCOSE INTOLERANCE 03/12/2010  . DYSLIPIDEMIA 03/12/2010  . Chronic migraine 03/12/2010  . Carotid stenosis 03/12/2010  . CEREBROVASCULAR ACCIDENT 03/12/2010  . LIPOMA 01/29/2010  . HEADACHE 01/29/2010  . ANKLE INJURY, RIGHT 04/12/2009  . PHARYNGITIS 03/21/2009  . Backache 03/01/2009  . ALLERGIC RHINITIS 03/17/2007  . LOW BACK PAIN 03/17/2007  . Essential hypertension 01/01/2007  . ANXIETY STATE NOS 09/11/2005    Past Surgical History:  Procedure Laterality Date  . ABDOMINAL HYSTERECTOMY    . BOWEL RESECTION N/A 03/06/2018   Procedure: SMALL BOWEL ANASTAMOSIS;  Surgeon: Clovis Riley, MD;  Location: [place];  Service: General;  Laterality: N/A;  . CESAREAN SECTION  yrs ago   done x 2  . CHOLECYSTECTOMY  01/05/2012   Procedure: LAPAROSCOPIC CHOLECYSTECTOMY WITH INTRAOPERATIVE CHOLANGIOGRAM;  Surgeon: Pedro Earls, MD;  Location: [facility];  Service: General;  Laterality: N/A;  . COLONOSCOPY  10/08/2012   Procedure: COLONOSCOPY;  Surgeon: Beryle Beams, MD;  Location: [facility];  Service: Endoscopy;  Laterality: N/A;  . KNEE ARTHROSCOPY  one 1995 and 1 in 1997   both knees done  . LAPAROSCOPY N/A 03/06/2018   Procedure: LAPAROSCOPY DIAGNOSTIC WITH LYSIS OF ADHESIONS;  Surgeon: Clovis Riley, MD;  Location: [place];  Service: General;  Laterality: N/A;  . LAPAROTOMY N/A 03/06/2018   Procedure: EXPLORATORY LAPAROTOMY, RESECTION OF DISTAL ROUX, CLOSURE OF INTERNAL HERNIA;  Surgeon: Clovis Riley, MD;  Location: [facility];  Service: General;  Laterality: N/A;  . surgery for endometriosis  yrs ago  . thryoid biopsy  December 01, 2011    at [facility]     OB History   No obstetric history on file.      Home Medications    Prior to Admission medications   Medication Sig Start Date End Date Taking? Authorizing Provider  aMILoride (MIDAMOR) 5 MG tablet Take 5 mg by mouth daily.    [provider]  amLODipine (NORVASC) 5 MG tablet Take 1 tablet (5 mg total) by mouth daily. 12/19/17    Roxan Hockey, MD  benzonatate (TESSALON) 100 MG capsule Take 1 capsule (100 mg total) by mouth 3 (three) times daily as needed for cough. 10/06/18   Duffy Bruce, MD  celecoxib (CELEBREX) 100 MG capsule Take 1 capsule (100 mg total) by mouth 2 (two) times daily. 68/0/88   Delora Fuel, MD  Cholecalciferol 50000 units capsule Take 50,000 Units by mouth every Friday.    [provider]  diclofenac sodium (VOLTAREN) 1 % GEL Apply 4 g topically 4 (four) times daily. 01/30/19   Signe Tackitt C, PA-C  DULoxetine (CYMBALTA) 60 MG capsule Take 60 mg by mouth daily. 03/16/17   [provider]  famotidine (PEPCID) 20 MG tablet Take 1 tablet (20 mg total) by mouth 2 (two) times daily for 5 days. 10/06/18 10/11/18  Duffy Bruce, MD  fluticasone (FLONASE) 50 MCG/ACT nasal spray Place 1 spray into both nostrils daily as needed for allergies.  11/08/17   [provider]  folic acid (FOLVITE) 1 MG tablet Take 1 mg by mouth daily. 10/14/17   [provider]  HYDROcodone-acetaminophen (NORCO/VICODIN) 5-325 MG tablet Take 2 tablets by mouth every 6 (six) hours as needed for moderate pain.    [provider]  HYDROcodone-homatropine (HYCODAN) 5-1.5 MG/5ML syrup Take 5 mLs by mouth every 6 (six) hours as needed for cough. 10/06/18   Duffy Bruce, MD  lidocaine (LIDODERM) 5 % Place 1 patch onto the skin daily. Remove & Discard patch within 12 hours or as directed by MD 11/05/18   Langston Masker B, PA-C  loratadine (CLARITIN) 10 MG tablet Take 10 mg by mouth daily.     [provider]  LYRICA 25 MG capsule Take 25 mg by mouth 3 (three) times daily.  11/11/17   [provider]  methocarbamol (ROBAXIN) 500 MG tablet Take 1 tablet (500 mg total) by mouth 2 (two) times daily. 01/30/19   Evalin Shawhan C, PA-C  metoprolol succinate (TOPROL-XL) 50 MG 24 hr tablet TAKE 1 TABLET ER 24HR DAILY ORAL Patient taking differently: Take 50 mg by mouth daily.  12/18/17   Roxan Hockey, MD  nitroGLYCERIN (NITROSTAT) 0.4 MG SL tablet Place 1 tablet (0.4 mg total) under the tongue every 5 (five) minutes x 3 doses as needed for chest pain. Patient taking differently: Place 0.4 mg under the tongue every 5 (five) minutes as needed for chest pain. Maximum 3 doses 03/13/14   Charolette Forward, MD  oxyCODONE-acetaminophen (PERCOCET) 5-325 MG tablet Take 1-2 tablets by mouth every 4 (four) hours as needed for moderate pain. 11/0/31   Delora Fuel, MD  pantoprazole (PROTONIX) 40 MG tablet Take 40 mg by mouth 2 (two) times daily.     [provider]  potassium chloride SA (K-DUR,KLOR-CON) 20 MEQ tablet Take 1 tablet (20 mEq total) by mouth 2 (two) times daily. 59/45/85   Delora Fuel, MD  metoprolol (  LOPRESSOR) 50 MG tablet Take 50 mg by mouth 2 (two) times daily.    12/04/11  [provider]  simvastatin (ZOCOR) 20 MG tablet Take 20 mg by mouth at bedtime.    12/04/11  [provider]    Family History Family History  Problem Relation Age of Onset  . Diabetes Mother   . Hypertension Mother   . Transient ischemic attack Mother   . Seizures Mother   . Dementia Mother   . Other Father        MVA  . Cancer Brother        colon and lung  . Cancer Maternal Grandmother        colon    Social History Social History   Tobacco Use  . Smoking status: Never Smoker  . Smokeless tobacco: Never Used  Substance Use Topics  . Alcohol use: Yes    Alcohol/week: 0.0 standard drinks    Comment: Occasionally  . Drug use: No     Allergies   Shrimp [shellfish allergy] and Naproxen   Review of Systems Review of Systems  Constitutional: Negative for chills and fever.  Respiratory: Negative for shortness of breath.   Cardiovascular: Negative for chest pain.  Gastrointestinal: Negative for abdominal pain, diarrhea, nausea and vomiting.  Genitourinary: Negative for difficulty urinating.  Musculoskeletal: Positive for back pain.  Neurological: Negative for  weakness and numbness.  All other systems reviewed and are negative.    Physical Exam Updated Vital Signs BP 135/74 (BP Location: Right Arm)   Pulse 69   Temp 98.3 F (36.8 C) (Oral)   Resp 16   SpO2 100%   Physical Exam Vitals signs and nursing note reviewed.  Constitutional:      General: She is not in acute distress.    Appearance: She is well-developed. She is not diaphoretic.  HENT:     Head: Normocephalic and atraumatic.     Mouth/Throat:     Mouth: Mucous membranes are moist.     Pharynx: Oropharynx is clear.  Eyes:     Conjunctiva/sclera: Conjunctivae normal.  Neck:     Musculoskeletal: Neck supple.  Cardiovascular:     Rate and Rhythm: Normal rate and regular rhythm.     Pulses: Normal pulses.          Posterior tibial pulses are 2+ on the right side and 2+ on the left side.     Comments: Tactile temperature in the extremities appropriate and equal bilaterally. Pulmonary:     Effort: Pulmonary effort is normal. No respiratory distress.  Abdominal:     Palpations: Abdomen is soft.     Tenderness: There is no abdominal tenderness. There is no guarding.  Musculoskeletal:     Right lower leg: No edema.     Left lower leg: No edema.     Comments: Tenderness throughout the right lumbar musculature.  No point tenderness over the spine.  No swelling, deformity, step-off, or noted instability.  Lymphadenopathy:     Cervical: No cervical adenopathy.  Skin:    General: Skin is warm and dry.  Neurological:     Mental Status: She is alert.     Comments: Sensation grossly intact to light touch in the lower extremities bilaterally. No saddle anesthesias. Strength 5/5 in the bilateral lower extremities. Slow, antalgic gait, but steady and able to ambulate without assistance.  Psychiatric:        Mood and Affect: Mood and affect normal.  Speech: Speech normal.        Behavior: Behavior normal.      ED Treatments / Results  Labs (all labs ordered are listed,  but only abnormal results are displayed) Labs Reviewed - No data to display  EKG None  Radiology No results found.  Procedures Procedures (including critical care time)  Medications Ordered in ED Medications  HYDROmorphone (DILAUDID) injection 1 mg (1 mg Intramuscular Given 01/30/19 2204)     Initial Impression / Assessment and Plan / ED Course  I have reviewed the triage vital signs and the nursing notes.  Pertinent labs & imaging results that were available during my care of the patient were reviewed by me and considered in my medical decision making (see chart for details).        Patient presents with acute on chronic lower back pain.  She has no evidence of neurovascular compromise.  My suspicion for surgical emergencies, such as cauda equina, is low based on presentation and physical exam.  Patient has close follow-up with her neurosurgeon already scheduled. We discussed multiple options, including nonnarcotic oral medications and topicals; decided on a mutually acceptable option together. Return precautions discussed.  Patient voices understanding of these instructions, accepts the plan, and is comfortable with discharge.  Final Clinical Impressions(s) / ED Diagnoses   Final diagnoses:  Chronic right-sided low back pain with right-sided sciatica    ED Discharge Orders         Ordered    methocarbamol (ROBAXIN) 500 MG tablet  2 times daily     01/30/19 2152    diclofenac sodium (VOLTAREN) 1 % GEL  4 times daily     01/30/19 2152           Layla Maw 01/30/19 2255    Sherwood Gambler, MD 02/02/19 1147

## 2019-01-30 NOTE — Discharge Instructions (Signed)
°  Robaxin: Robaxin is a muscle relaxer and can help relieve stiff muscles or muscle spasms.  Do not drive or perform other dangerous activities while taking the Robaxin.  Diclofenac gel: This is a topical anti-inflammatory medication and can be applied directly to the painful region.  Do not use on the face or genitals.  This medication may be used as an alternative to oral anti-inflammatory medications, such as ibuprofen or naproxen.  May continue to take your previous pain management regimen.  Follow-up with the neurosurgical specialist, as planned.

## 2019-01-30 NOTE — ED Triage Notes (Signed)
Reports chronic back pain, states it is really bad today, located lower back and down both legs, has surgery for it planned. Took Hydrocodone 1.5 hours ago.

## 2019-01-30 NOTE — ED Notes (Signed)
Pt ambulatory down the hall and back to room.

## 2019-01-30 NOTE — ED Notes (Signed)
Discharge instructions and prescriptions discussed with Pt. Pt verbalized understanding. Pt stable and ambulatory.   

## 2019-02-05 IMAGING — CT CT ABD-PELV W/ CM
2 of 4 series · 17 of 46 positions shown, 19 images · IV contrast (Omni 300)
Comparison: 10/03/2016

CLINICAL DATA: Abdominal pain and vomiting.

EXAM:
CT ABDOMEN AND PELVIS WITH CONTRAST
TECHNIQUE: Multidetector CT imaging of the abdomen and pelvis was performed
using the standard protocol following bolus administration of
intravenous contrast.
CONTRAST:  100mL OMNIPAQUE IOHEXOL 300 MG/ML  SOLN

[Series 3: a/p w/ 5mm · axial · 0.68mm/px · z∈[+691,+1106]mm · 14 of 91 slices shown, 16 images]
[im 4/91  soft-tissue]
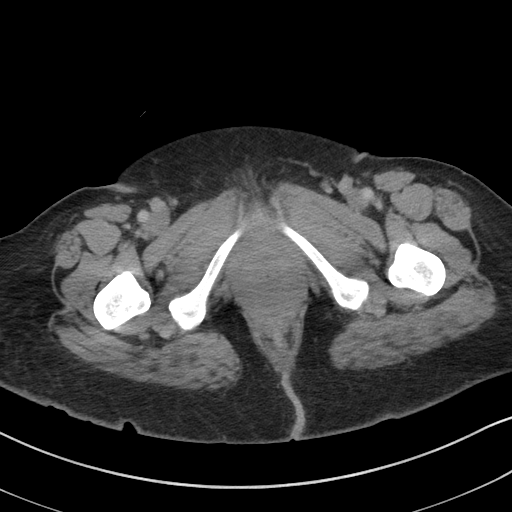
[im 4/91  bone]
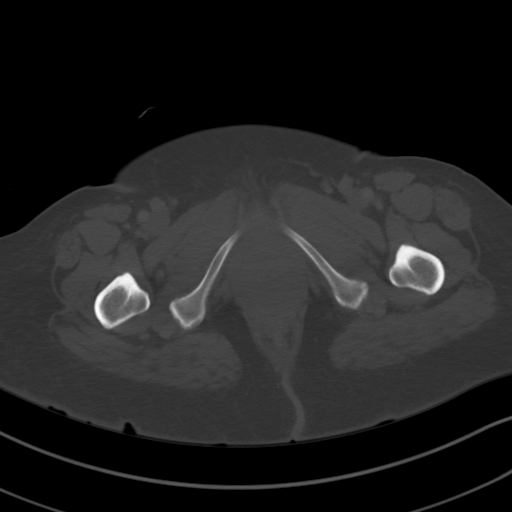
[im 12/91  soft-tissue]
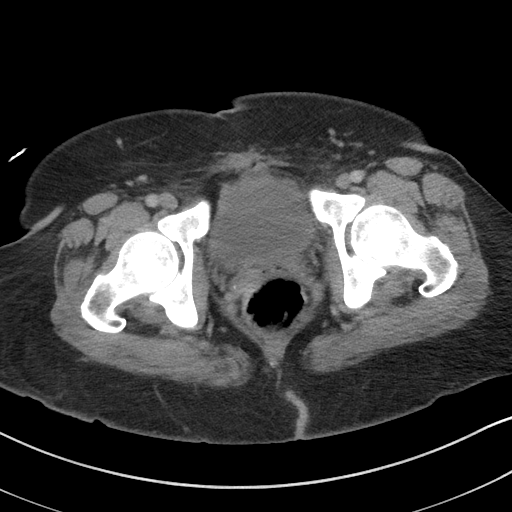
[im 16/91  soft-tissue]
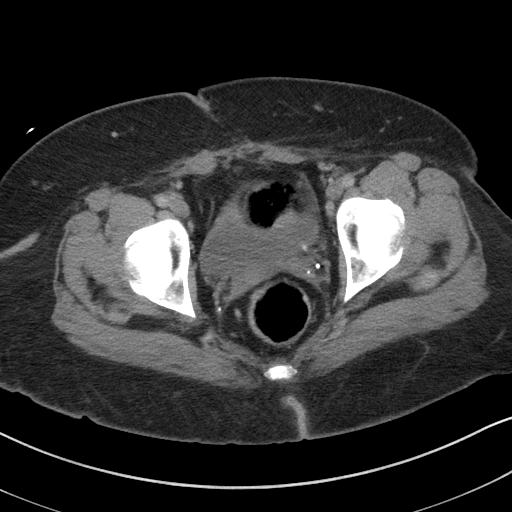
[im 24/91  soft-tissue]
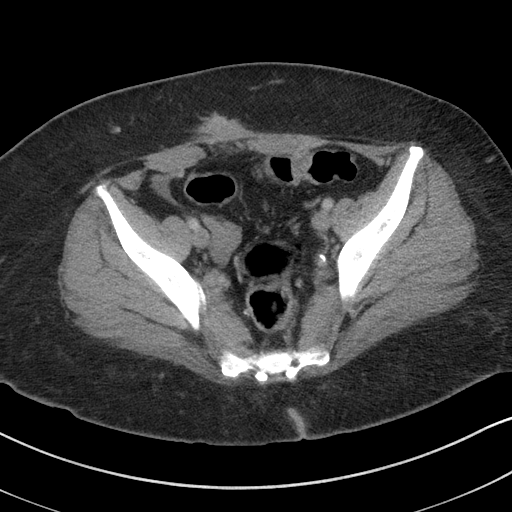
[im 32/91  soft-tissue]
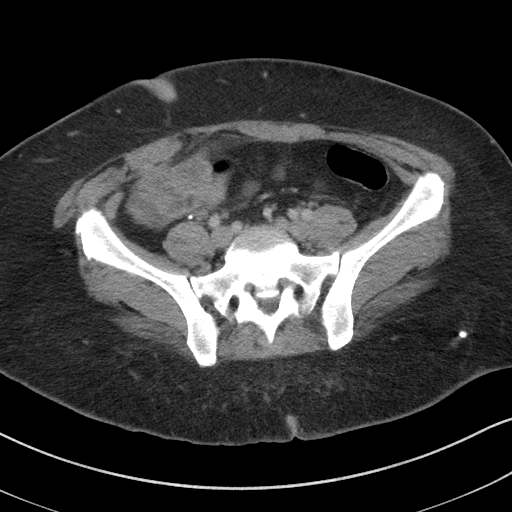
[im 36/91  soft-tissue]
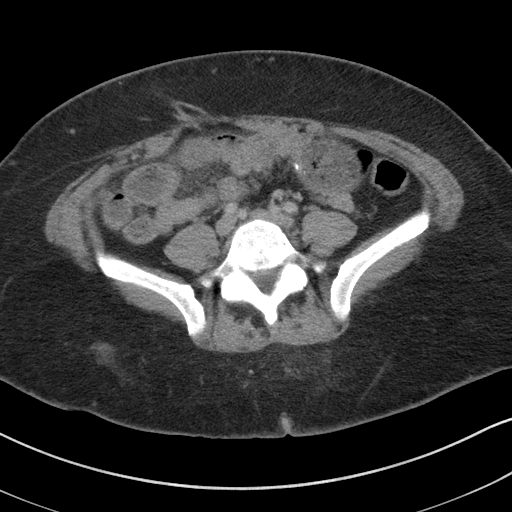
[im 44/91  soft-tissue]
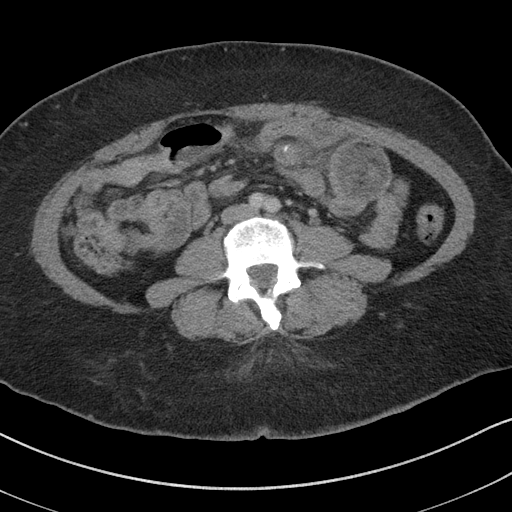
[im 47/91  soft-tissue]
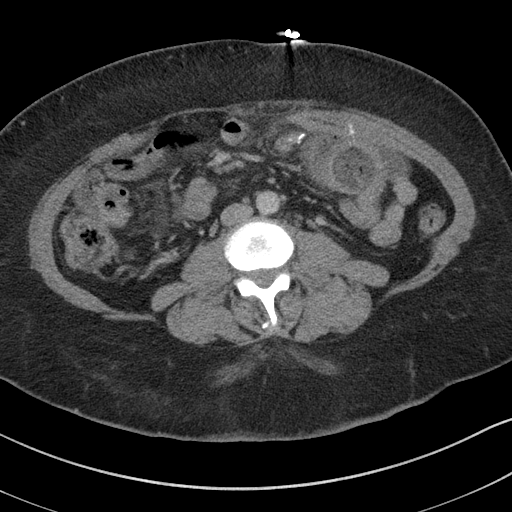
[im 55/91  soft-tissue]
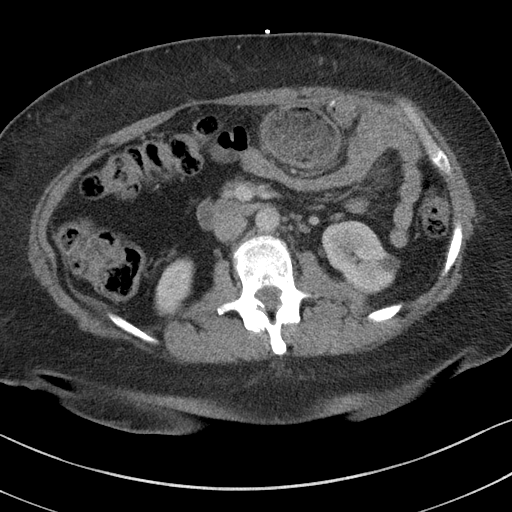
[im 55/91  bone]
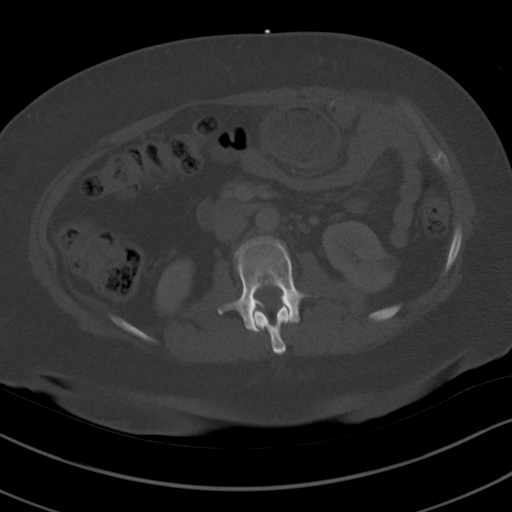
[im 59/91  soft-tissue]
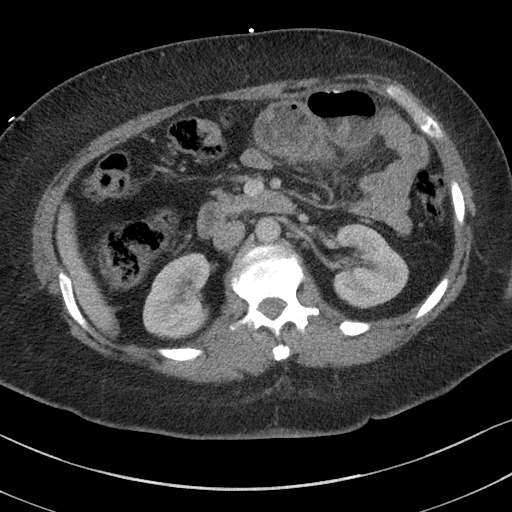
[im 67/91  soft-tissue]
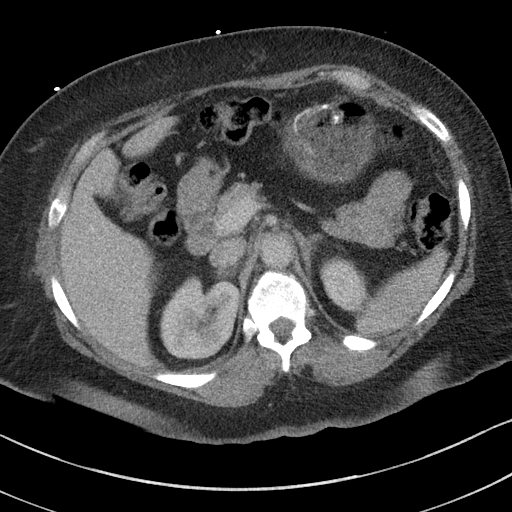
[im 75/91  soft-tissue]
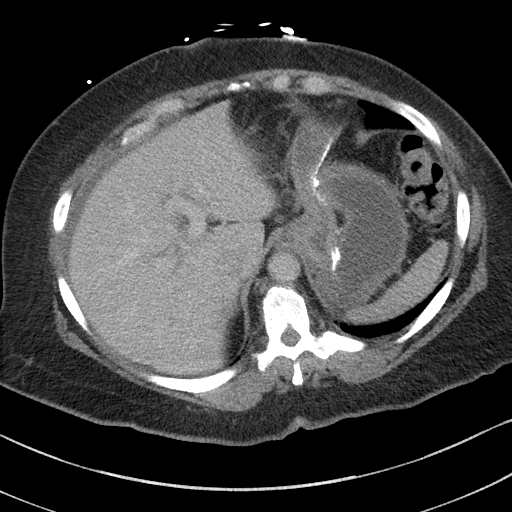
[im 79/91  soft-tissue]
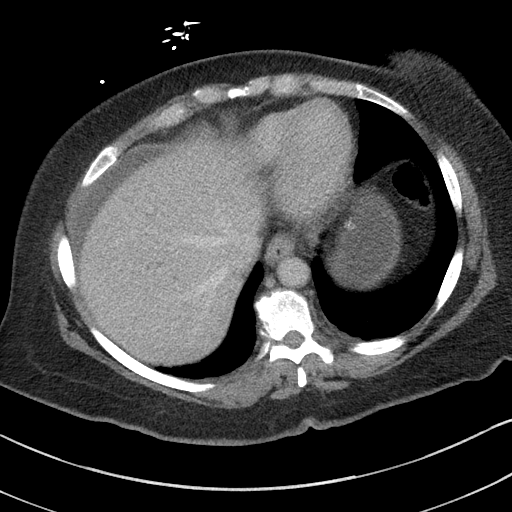
[im 87/91  soft-tissue]
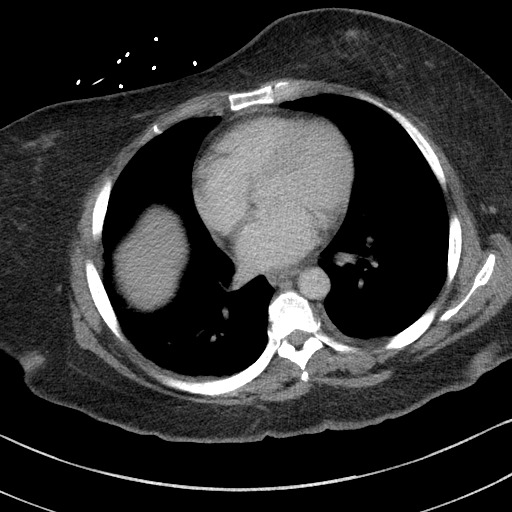

[Series 6: a/p w/ cor · coronal · 0.88mm/px · 3 of 151 slices shown]
[im 51/151  soft-tissue]
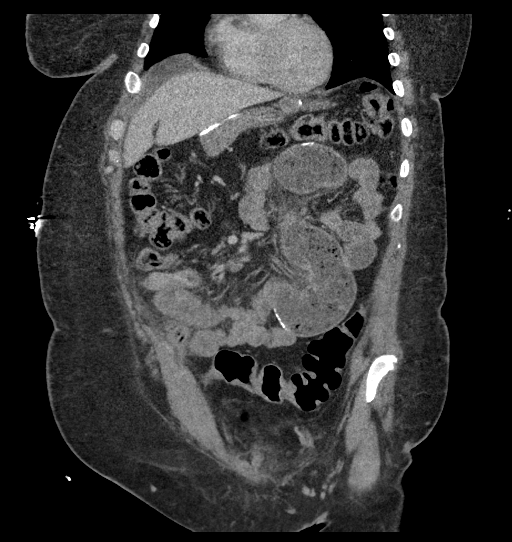
[im 67/151  soft-tissue]
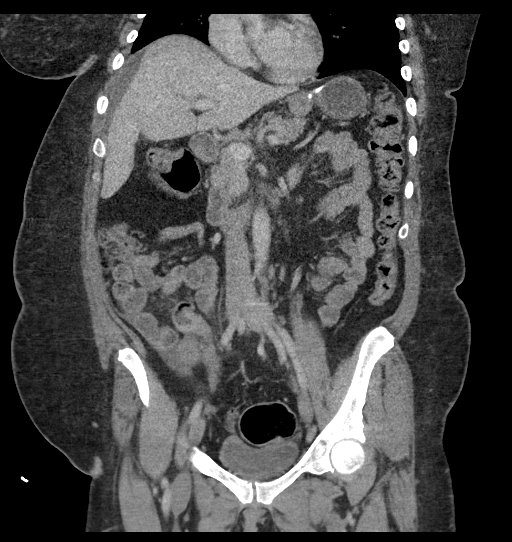
[im 84/151  soft-tissue]
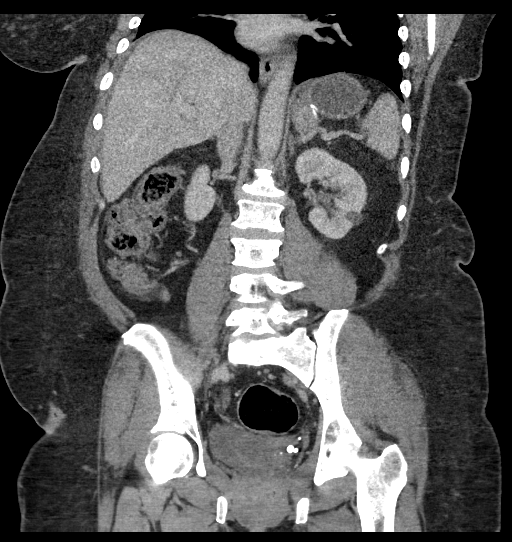

[17 of 46 positions shown; findings below may reference images not displayed]

FINDINGS: Lower Chest: No acute findings.

Hepatobiliary: No hepatic masses identified. Prior cholecystectomy.
No evidence of biliary obstruction.

Pancreas:  No mass or inflammatory changes.

Spleen: Within normal limits in size and appearance.

Adrenals/Urinary Tract: No masses identified. Several bilateral
renal cysts again noted. No evidence of hydronephrosis.

Stomach/Bowel: Previous gastric bypass surgery again noted. Moderate
dilatation of efferent small bowel loops are seen in the left
abdomen, with transition point in the entero-enteric anastomosis.
This is likely due to anastomotic stricture or adhesion. No evidence
of focal inflammatory process. Mild ascites seen in the right
perihepatic space and paracolic gutter. No evidence of free
intraperitoneal air or pneumatosis. Normal appendix visualized.

Vascular/Lymphatic: No pathologically enlarged lymph nodes. No
abdominal aortic aneurysm.

Reproductive: Prior hysterectomy noted. Adnexal regions are
unremarkable in appearance.

Other:  None.

Musculoskeletal:  No suspicious bone lesions identified.
IMPRESSION: Previous gastric bypass surgery, with small bowel obstruction
involving the efferent loop likely due to stricture or adhesion at
the enteroenteric anastomosis.

Mild ascites.  No evidence of focal inflammatory process or abscess.

## 2019-02-16 ENCOUNTER — Other Ambulatory Visit: Payer: Self-pay | Admitting: Neurosurgery

## 2019-03-01 NOTE — Progress Notes (Signed)
[id number] [place],  - [place] [address] [place] [address] Phone: [phone] Fax: [phone]      Your procedure is scheduled on June 5  Report to [facility] Main Entrance "A" at 1030 A.M., and check in at the Admitting office.  Call this number if you have problems the morning of surgery:  [phone]  Call [phone] if you have any questions prior to your surgery date Monday-Friday 8am-4pm    Remember:  Do not eat or drink after midnight.    Take these medicines the morning of surgery with A SIP OF WATER  amLODipine (NORVASC) DULoxetine (CYMBALTA fluticasone (FLONASE HYDROcodone-acetaminophen (NORCO)  If needed  loratadine (CLARITIN)  LYRICA metoprolol succinate (TOPROL-XL) pantoprazole (PROTONIX)  Follow your surgeon's instructions on when to stop Aspirin.  If no instructions were given by your surgeon then you will need to call the office to get those instructions.    7 days prior to surgery STOP taking any Aleve, Naproxen, Ibuprofen, Motrin, Advil, Goody's, BC's, all herbal medications, fish oil, and all vitamins.    The Morning of Surgery  Do not wear jewelry, make-up or nail polish.  Do not wear lotions, powders, or perfumes/colognes, or deodorant  Do not shave 48 hours prior to surgery.  Men may shave face and neck.  Do not bring valuables to the hospital.  [facility] is not responsible for any belongings or valuables.  If you are a smoker, DO NOT Smoke 24 hours prior to surgery IF you wear a CPAP at night please bring your mask, tubing, and machine the morning of surgery   Remember that you must have someone to transport you home after your surgery, and remain with you for 24 hours if you are discharged the same day.   Contacts, glasses, hearing aids, dentures or bridgework may not be worn into surgery.    Leave your suitcase in the car.  After surgery it may be  brought to your room.  For patients admitted to the hospital, discharge time will be determined by your treatment team.  Patients discharged the day of surgery will not be allowed to drive home.    Special instructions:   [place]- Preparing For Surgery  Before surgery, you can play an important role. Because skin is not sterile, your skin needs to be as free of germs as possible. You can reduce the number of germs on your skin by washing with CHG (chlorahexidine gluconate) Soap before surgery.  CHG is an antiseptic cleaner which kills germs and bonds with the skin to continue killing germs even after washing.    Oral Hygiene is also important to reduce your risk of infection.  Remember - BRUSH YOUR TEETH THE MORNING OF SURGERY WITH YOUR REGULAR TOOTHPASTE  Please do not use if you have an allergy to CHG or antibacterial soaps. If your skin becomes reddened/irritated stop using the CHG.  Do not shave (including legs and underarms) for at least 48 hours prior to first CHG shower. It is OK to shave your face.  Please follow these instructions carefully.   1. Shower the NIGHT BEFORE SURGERY and the MORNING OF SURGERY with CHG Soap.   2. If you chose to wash your hair, wash your hair first as usual with your normal shampoo.  3. After you shampoo, rinse your hair and body thoroughly to remove the shampoo.  4. Use CHG as you would any other  liquid soap. You can apply CHG directly to the skin and wash gently with a scrungie or a clean washcloth.   5. Apply the CHG Soap to your body ONLY FROM THE NECK DOWN.  Do not use on open wounds or open sores. Avoid contact with your eyes, ears, mouth and genitals (private parts). Wash Face and genitals (private parts)  with your normal soap.   6. Wash thoroughly, paying special attention to the area where your surgery will be performed.  7. Thoroughly rinse your body with warm water from the neck down.  8. DO NOT shower/wash with your normal soap  after using and rinsing off the CHG Soap.  9. Pat yourself dry with a CLEAN TOWEL.  10. Wear CLEAN PAJAMAS to bed the night before surgery, wear comfortable clothes the morning of surgery  11. Place CLEAN SHEETS on your bed the night of your first shower and DO NOT SLEEP WITH PETS.    Day of Surgery:  Do not apply any deodorants/lotions.  Please wear clean clothes to the hospital/surgery center.   Remember to brush your teeth WITH YOUR REGULAR TOOTHPASTE.   Please read over the following fact sheets that you were given.

## 2019-03-02 ENCOUNTER — Encounter (HOSPITAL_COMMUNITY)
Admission: RE | Admit: 2019-03-02 | Discharge: 2019-03-02 | Disposition: A | Discharge status: Home / Self Care | Payer: Medicare Other | Source: Ambulatory Visit | Attending: Neurosurgery | Admitting: Neurosurgery

## 2019-03-02 ENCOUNTER — Encounter (HOSPITAL_COMMUNITY): Payer: Self-pay

## 2019-03-02 ENCOUNTER — Other Ambulatory Visit (HOSPITAL_COMMUNITY)
Admission: RE | Admit: 2019-03-02 | Discharge: 2019-03-02 | Disposition: A | Discharge status: Home / Self Care | Payer: Medicare Other | Source: Ambulatory Visit | Attending: Neurosurgery | Admitting: Neurosurgery

## 2019-03-02 ENCOUNTER — Other Ambulatory Visit: Payer: Self-pay

## 2019-03-02 DIAGNOSIS — Z01812 Encounter for preprocedural laboratory examination: Secondary | ICD-10-CM | POA: Insufficient documentation

## 2019-03-02 DIAGNOSIS — Z1159 Encounter for screening for other viral diseases: Secondary | ICD-10-CM | POA: Insufficient documentation

## 2019-03-02 HISTORY — DX: Other specified postprocedural states: R11.2

## 2019-03-02 HISTORY — DX: Other specified postprocedural states: Z98.890

## 2019-03-02 HISTORY — DX: Gastro-esophageal reflux disease without esophagitis: K21.9

## 2019-03-02 LAB — CBC WITH DIFFERENTIAL/PLATELET
Abs Immature Granulocytes: 0.02 10*3/uL (ref 0.00–0.07)
Basophils Absolute: 0 10*3/uL (ref 0.0–0.1)
Basophils Relative: 1 %
Eosinophils Absolute: 0.1 10*3/uL (ref 0.0–0.5)
Eosinophils Relative: 1 %
HCT: 39 % (ref 36.0–46.0)
Hemoglobin: 12.4 g/dL (ref 12.0–15.0)
Immature Granulocytes: 0 %
Lymphocytes Relative: 51 %
Lymphs Abs: 4.1 10*3/uL — ABNORMAL HIGH (ref 0.7–4.0)
MCH: 29.7 pg (ref 26.0–34.0)
MCHC: 31.8 g/dL (ref 30.0–36.0)
MCV: 93.3 fL (ref 80.0–100.0)
Monocytes Absolute: 0.6 10*3/uL (ref 0.1–1.0)
Monocytes Relative: 7 %
Neutro Abs: 3.2 10*3/uL (ref 1.7–7.7)
Neutrophils Relative %: 40 %
Platelets: 214 10*3/uL (ref 150–400)
RBC: 4.18 MIL/uL (ref 3.87–5.11)
RDW: 14.8 % (ref 11.5–15.5)
WBC: 8 10*3/uL (ref 4.0–10.5)
nRBC: 0 % (ref 0.0–0.2)

## 2019-03-02 LAB — TYPE AND SCREEN
ABO/RH(D): O POS
Antibody Screen: NEGATIVE

## 2019-03-02 LAB — ABO/RH: ABO/RH(D): O POS

## 2019-03-02 LAB — SURGICAL PCR SCREEN
MRSA, PCR: NEGATIVE
Staphylococcus aureus: POSITIVE — AB

## 2019-03-02 NOTE — Progress Notes (Signed)
PCP - Jeanelle Malling PA-C Cardiologist - Dr. Charolette Forward  Chest x-ray - denies EKG - 10/06/2018 Stress Test - 12/18/17 ECHO - 03/13/14 Cardiac Cath - denies  Sleep Study - prior to gastric bypass surgery CPAP - per patient no longer uses due to weight loss surgery in 2017  Blood Thinner Instructions: N/A Aspirin Instructions: LD 02/21/2019  Anesthesia review: YES, heart hx,stroke hx  Coronavirus Screening  Have you experienced the following symptoms:  Cough yes/no: No Fever (>100.39F)  yes/no: No Runny nose yes/no: No Sore throat yes/no: No Difficulty breathing/shortness of breath  yes/no: No  Have you or a family member traveled in the last 14 days and where? yes/no: No  If the patient indicates "YES" to the above questions, their PAT will be rescheduled to limit the exposure to others and, the surgeon will be notified. THE PATIENT WILL NEED TO BE ASYMPTOMATIC FOR 14 DAYS.   If the patient is not experiencing any of these symptoms, the PAT nurse will instruct them to NOT bring anyone with them to their appointment since they may have these symptoms or traveled as well.   Please remind your patients and families that hospital visitation restrictions are in effect and the importance of the restrictions.   Patient denies shortness of breath, fever, cough and chest pain at PAT appointment  Patient verbalized understanding of instructions that were given to them at the PAT appointment. Patient was also instructed that they will need to review over the PAT instructions again at home before surgery.

## 2019-03-03 LAB — NOVEL CORONAVIRUS, NAA (HOSP ORDER, SEND-OUT TO REF LAB; TAT 18-24 HRS): SARS-CoV-2, NAA: NOT DETECTED

## 2019-03-03 NOTE — Anesthesia Preprocedure Evaluation (Addendum)
Anesthesia Evaluation  Patient identified by MRN, date of birth, ID band Patient awake    Reviewed: Allergy & Precautions, NPO status , Patient's Chart, lab work & pertinent test results, reviewed documented beta blocker date and time   History of Anesthesia Complications (+) PONV  Airway Mallampati: II  TM Distance: >3 FB Neck ROM: Full    Dental  (+) Edentulous Upper, Missing,    Pulmonary sleep apnea ,    Pulmonary exam normal        Cardiovascular hypertension, Pt. on home beta blockers and Pt. on medications negative cardio ROS Normal cardiovascular exam     Neuro/Psych  Headaches, Anxiety CVA (2000)    GI/Hepatic Neg liver ROS, GERD  Medicated and Controlled,  Endo/Other  negative endocrine ROS  Renal/GU negative Renal ROS     Musculoskeletal  (+) Arthritis , Fibromyalgia -  Abdominal   Peds  Hematology negative hematology ROS (+)   Anesthesia Other Findings Day of surgery medications reviewed with the patient.  Reproductive/Obstetrics                           Anesthesia Physical Anesthesia Plan  ASA: III  Anesthesia Plan: General   Post-op Pain Management:    Induction: Intravenous  PONV Risk Score and Plan: 4 or greater and Treatment may vary due to age or medical condition, Ondansetron, Dexamethasone and Midazolam  Airway Management Planned: Oral ETT  Additional Equipment:   Intra-op Plan:   Post-operative Plan: Extubation in OR  Informed Consent: I have reviewed the patients History and Physical, chart, labs and discussed the procedure including the risks, benefits and alternatives for the proposed anesthesia with the patient or authorized representative who has indicated his/her understanding and acceptance.     Dental advisory given  Plan Discussed with: CRNA  Anesthesia Plan Comments: (Follows with Dr. Terrence Dupont  For hx of Chest pain, HTN, HLD, CVA. Last seen  01/17/19, doing well per OV note, no changes at that time. Nuclear stress 2019 was low risk, no ischemia.   Nuclear stress 12/18/17: 1. Apical scarring or apical thinning, with slight extension into the adjacent anterior wall. No inducible ischemia 2. Equivocal septal hypokinesis at the cardiac base. 3. Left ventricular ejection fraction 55% 4. Non invasive risk stratification*: Low  TTE 03/13/2014: - Left ventricle: The cavity size was normal. Systolic function was  normal. The estimated ejection fraction was in the range of 55%  to 60%. Wall motion was normal; there were no regional wall  motion abnormalities. Left ventricular diastolic function  parameters were normal.  - Atrial septum: No defect or patent foramen ovale was identified.  )      Anesthesia Quick Evaluation

## 2019-03-04 ENCOUNTER — Other Ambulatory Visit: Payer: Self-pay

## 2019-03-04 ENCOUNTER — Encounter (HOSPITAL_COMMUNITY)
Admission: RE | Disposition: A | Discharge status: Rehabilitation Facility | Payer: Self-pay | Source: Home / Self Care | Attending: Neurosurgery

## 2019-03-04 ENCOUNTER — Inpatient Hospital Stay (HOSPITAL_COMMUNITY)
Admission: RE | Admit: 2019-03-04 | Discharge: 2019-03-08 | DRG: 454 | Disposition: A | Discharge status: Rehabilitation Facility | Payer: Medicare Other | Attending: Neurosurgery | Admitting: Neurosurgery

## 2019-03-04 ENCOUNTER — Inpatient Hospital Stay (HOSPITAL_COMMUNITY): Payer: Medicare Other | Admitting: Anesthesiology

## 2019-03-04 ENCOUNTER — Encounter (HOSPITAL_COMMUNITY): Payer: Self-pay

## 2019-03-04 ENCOUNTER — Inpatient Hospital Stay (HOSPITAL_COMMUNITY): Payer: Medicare Other | Admitting: Physician Assistant

## 2019-03-04 ENCOUNTER — Inpatient Hospital Stay (HOSPITAL_COMMUNITY): Payer: Medicare Other

## 2019-03-04 DIAGNOSIS — E860 Dehydration: Secondary | ICD-10-CM | POA: Diagnosis not present

## 2019-03-04 DIAGNOSIS — Z7982 Long term (current) use of aspirin: Secondary | ICD-10-CM

## 2019-03-04 DIAGNOSIS — R339 Retention of urine, unspecified: Secondary | ICD-10-CM | POA: Diagnosis not present

## 2019-03-04 DIAGNOSIS — I10 Essential (primary) hypertension: Secondary | ICD-10-CM | POA: Diagnosis not present

## 2019-03-04 DIAGNOSIS — Z1159 Encounter for screening for other viral diseases: Secondary | ICD-10-CM

## 2019-03-04 DIAGNOSIS — M48061 Spinal stenosis, lumbar region without neurogenic claudication: Secondary | ICD-10-CM | POA: Diagnosis present

## 2019-03-04 DIAGNOSIS — Z90710 Acquired absence of both cervix and uterus: Secondary | ICD-10-CM | POA: Diagnosis not present

## 2019-03-04 DIAGNOSIS — M797 Fibromyalgia: Secondary | ICD-10-CM | POA: Diagnosis present

## 2019-03-04 DIAGNOSIS — H60392 Other infective otitis externa, left ear: Secondary | ICD-10-CM | POA: Diagnosis not present

## 2019-03-04 DIAGNOSIS — Z4789 Encounter for other orthopedic aftercare: Secondary | ICD-10-CM | POA: Diagnosis not present

## 2019-03-04 DIAGNOSIS — I951 Orthostatic hypotension: Secondary | ICD-10-CM | POA: Diagnosis not present

## 2019-03-04 DIAGNOSIS — D72829 Elevated white blood cell count, unspecified: Secondary | ICD-10-CM | POA: Diagnosis not present

## 2019-03-04 DIAGNOSIS — Z7951 Long term (current) use of inhaled steroids: Secondary | ICD-10-CM

## 2019-03-04 DIAGNOSIS — R338 Other retention of urine: Secondary | ICD-10-CM | POA: Diagnosis not present

## 2019-03-04 DIAGNOSIS — Z79899 Other long term (current) drug therapy: Secondary | ICD-10-CM | POA: Diagnosis not present

## 2019-03-04 DIAGNOSIS — M5117 Intervertebral disc disorders with radiculopathy, lumbosacral region: Secondary | ICD-10-CM | POA: Diagnosis present

## 2019-03-04 DIAGNOSIS — Z6829 Body mass index (BMI) 29.0-29.9, adult: Secondary | ICD-10-CM | POA: Diagnosis not present

## 2019-03-04 DIAGNOSIS — M4807 Spinal stenosis, lumbosacral region: Secondary | ICD-10-CM | POA: Diagnosis present

## 2019-03-04 DIAGNOSIS — K5900 Constipation, unspecified: Secondary | ICD-10-CM | POA: Diagnosis not present

## 2019-03-04 DIAGNOSIS — E785 Hyperlipidemia, unspecified: Secondary | ICD-10-CM | POA: Diagnosis not present

## 2019-03-04 DIAGNOSIS — M48062 Spinal stenosis, lumbar region with neurogenic claudication: Secondary | ICD-10-CM | POA: Diagnosis not present

## 2019-03-04 DIAGNOSIS — N9989 Other postprocedural complications and disorders of genitourinary system: Secondary | ICD-10-CM | POA: Diagnosis not present

## 2019-03-04 DIAGNOSIS — M545 Low back pain: Secondary | ICD-10-CM | POA: Diagnosis present

## 2019-03-04 DIAGNOSIS — G894 Chronic pain syndrome: Secondary | ICD-10-CM | POA: Diagnosis not present

## 2019-03-04 DIAGNOSIS — M5416 Radiculopathy, lumbar region: Secondary | ICD-10-CM | POA: Diagnosis not present

## 2019-03-04 DIAGNOSIS — Z8673 Personal history of transient ischemic attack (TIA), and cerebral infarction without residual deficits: Secondary | ICD-10-CM | POA: Diagnosis not present

## 2019-03-04 DIAGNOSIS — D62 Acute posthemorrhagic anemia: Secondary | ICD-10-CM | POA: Diagnosis not present

## 2019-03-04 DIAGNOSIS — M1990 Unspecified osteoarthritis, unspecified site: Secondary | ICD-10-CM | POA: Diagnosis not present

## 2019-03-04 DIAGNOSIS — E7800 Pure hypercholesterolemia, unspecified: Secondary | ICD-10-CM | POA: Diagnosis present

## 2019-03-04 DIAGNOSIS — K219 Gastro-esophageal reflux disease without esophagitis: Secondary | ICD-10-CM | POA: Diagnosis not present

## 2019-03-04 DIAGNOSIS — H6692 Otitis media, unspecified, left ear: Secondary | ICD-10-CM | POA: Diagnosis not present

## 2019-03-04 DIAGNOSIS — Z981 Arthrodesis status: Secondary | ICD-10-CM | POA: Diagnosis not present

## 2019-03-04 DIAGNOSIS — M5116 Intervertebral disc disorders with radiculopathy, lumbar region: Secondary | ICD-10-CM | POA: Diagnosis present

## 2019-03-04 DIAGNOSIS — Z419 Encounter for procedure for purposes other than remedying health state, unspecified: Secondary | ICD-10-CM

## 2019-03-04 DIAGNOSIS — N179 Acute kidney failure, unspecified: Secondary | ICD-10-CM | POA: Diagnosis not present

## 2019-03-04 DIAGNOSIS — G4730 Sleep apnea, unspecified: Secondary | ICD-10-CM | POA: Diagnosis not present

## 2019-03-04 HISTORY — PX: POSTERIOR LUMBAR FUSION: SHX6036

## 2019-03-04 LAB — GLUCOSE, CAPILLARY: Glucose-Capillary: 151 mg/dL — ABNORMAL HIGH (ref 70–99)

## 2019-03-04 SURGERY — POSTERIOR LUMBAR FUSION 2 LEVEL
Anesthesia: General | Site: Back

## 2019-03-04 MED ORDER — CEFAZOLIN SODIUM-DEXTROSE 2-4 GM/100ML-% IV SOLN
INTRAVENOUS | Status: AC
  Filled 2019-03-04: qty 100

## 2019-03-04 MED ORDER — PROPOFOL 10 MG/ML IV BOLUS
INTRAVENOUS | Status: AC
  Filled 2019-03-04: qty 20

## 2019-03-04 MED ORDER — PROMETHAZINE HCL 25 MG/ML IJ SOLN: 6.2500 mg | INTRAMUSCULAR | Status: DC | PRN

## 2019-03-04 MED ORDER — AMILORIDE HCL 5 MG PO TABS
5.0000 mg | ORAL_TABLET | Freq: Every day | ORAL | Status: DC
  Administered 2019-03-05 – 2019-03-08 (×4): 5 mg via ORAL
  Filled 2019-03-04 (×4): qty 1

## 2019-03-04 MED ORDER — CHOLECALCIFEROL 1.25 MG (50000 UT) PO CAPS: 50000.0000 [IU] | ORAL_CAPSULE | ORAL | Status: DC

## 2019-03-04 MED ORDER — DEXAMETHASONE SODIUM PHOSPHATE 10 MG/ML IJ SOLN
INTRAMUSCULAR | Status: AC
  Filled 2019-03-04: qty 1

## 2019-03-04 MED ORDER — CEFAZOLIN SODIUM-DEXTROSE 1-4 GM/50ML-% IV SOLN
1.0000 g | Freq: Three times a day (TID) | INTRAVENOUS | Status: AC
  Administered 2019-03-04 (×2): 1 g via INTRAVENOUS
  Filled 2019-03-04 (×3): qty 50

## 2019-03-04 MED ORDER — POTASSIUM CHLORIDE CRYS ER 20 MEQ PO TBCR
20.0000 meq | EXTENDED_RELEASE_TABLET | Freq: Two times a day (BID) | ORAL | Status: DC
  Administered 2019-03-04 – 2019-03-08 (×9): 20 meq via ORAL
  Filled 2019-03-04 (×9): qty 1

## 2019-03-04 MED ORDER — SODIUM CHLORIDE 0.9% FLUSH
3.0000 mL | Freq: Two times a day (BID) | INTRAVENOUS | Status: DC
  Administered 2019-03-04 – 2019-03-07 (×3): 3 mL via INTRAVENOUS

## 2019-03-04 MED ORDER — HYDROCODONE-ACETAMINOPHEN 10-325 MG PO TABS
1.0000 | ORAL_TABLET | ORAL | Status: DC | PRN
  Administered 2019-03-04 – 2019-03-08 (×12): 1 via ORAL
  Filled 2019-03-04 (×12): qty 1

## 2019-03-04 MED ORDER — VITAMIN D (ERGOCALCIFEROL) 1.25 MG (50000 UNIT) PO CAPS
50000.0000 [IU] | ORAL_CAPSULE | ORAL | Status: DC
  Administered 2019-03-07: 50000 [IU] via ORAL
  Filled 2019-03-04: qty 1

## 2019-03-04 MED ORDER — OXYCODONE HCL 5 MG/5ML PO SOLN: 5.0000 mg | Freq: Once | ORAL | Status: DC | PRN

## 2019-03-04 MED ORDER — PREGABALIN 25 MG PO CAPS
25.0000 mg | ORAL_CAPSULE | Freq: Three times a day (TID) | ORAL | Status: DC
  Administered 2019-03-04 – 2019-03-08 (×7): 25 mg via ORAL
  Filled 2019-03-04 (×9): qty 1

## 2019-03-04 MED ORDER — FENTANYL CITRATE (PF) 250 MCG/5ML IJ SOLN
INTRAMUSCULAR | Status: AC
  Filled 2019-03-04: qty 5

## 2019-03-04 MED ORDER — LIDOCAINE 2% (20 MG/ML) 5 ML SYRINGE
INTRAMUSCULAR | Status: DC | PRN
  Administered 2019-03-04: 60 mg via INTRAVENOUS

## 2019-03-04 MED ORDER — MIDAZOLAM HCL 2 MG/2ML IJ SOLN
INTRAMUSCULAR | Status: AC
  Filled 2019-03-04: qty 2

## 2019-03-04 MED ORDER — HYDROMORPHONE HCL 1 MG/ML IJ SOLN
0.2500 mg | INTRAMUSCULAR | Status: DC | PRN
  Administered 2019-03-04 (×2): 0.5 mg via INTRAVENOUS

## 2019-03-04 MED ORDER — FENTANYL CITRATE (PF) 250 MCG/5ML IJ SOLN
INTRAMUSCULAR | Status: DC | PRN
  Administered 2019-03-04 (×2): 25 ug via INTRAVENOUS
  Administered 2019-03-04 (×2): 50 ug via INTRAVENOUS

## 2019-03-04 MED ORDER — HYDROMORPHONE HCL 1 MG/ML IJ SOLN
INTRAMUSCULAR | Status: AC
  Filled 2019-03-04: qty 1

## 2019-03-04 MED ORDER — THROMBIN 20000 UNITS EX SOLR
CUTANEOUS | Status: DC | PRN
  Administered 2019-03-04: 20 mL via TOPICAL

## 2019-03-04 MED ORDER — CEFAZOLIN SODIUM-DEXTROSE 2-4 GM/100ML-% IV SOLN
2.0000 g | INTRAVENOUS | Status: AC
  Administered 2019-03-04: 2 g via INTRAVENOUS

## 2019-03-04 MED ORDER — DEXAMETHASONE SODIUM PHOSPHATE 10 MG/ML IJ SOLN: 10.0000 mg | INTRAMUSCULAR | Status: DC

## 2019-03-04 MED ORDER — SODIUM CHLORIDE 0.9 % IV SOLN
INTRAVENOUS | Status: DC | PRN
  Administered 2019-03-04: 25 ug/min via INTRAVENOUS

## 2019-03-04 MED ORDER — MIDAZOLAM HCL 5 MG/5ML IJ SOLN
INTRAMUSCULAR | Status: DC | PRN
  Administered 2019-03-04: 1 mg via INTRAVENOUS

## 2019-03-04 MED ORDER — CHLORHEXIDINE GLUCONATE CLOTH 2 % EX PADS: 6.0000 | MEDICATED_PAD | Freq: Once | CUTANEOUS | Status: DC

## 2019-03-04 MED ORDER — BISACODYL 10 MG RE SUPP: 10.0000 mg | Freq: Every day | RECTAL | Status: DC | PRN

## 2019-03-04 MED ORDER — DIAZEPAM 5 MG PO TABS: 5.0000 mg | ORAL_TABLET | Freq: Four times a day (QID) | ORAL | Status: DC | PRN

## 2019-03-04 MED ORDER — ONDANSETRON HCL 4 MG/2ML IJ SOLN: 4.0000 mg | Freq: Four times a day (QID) | INTRAMUSCULAR | Status: DC | PRN

## 2019-03-04 MED ORDER — LOSARTAN POTASSIUM 50 MG PO TABS
100.0000 mg | ORAL_TABLET | Freq: Every day | ORAL | Status: DC
  Administered 2019-03-05 – 2019-03-08 (×4): 100 mg via ORAL
  Filled 2019-03-04 (×4): qty 2

## 2019-03-04 MED ORDER — NITROGLYCERIN 0.4 MG SL SUBL: 0.4000 mg | SUBLINGUAL_TABLET | SUBLINGUAL | Status: DC | PRN

## 2019-03-04 MED ORDER — MENTHOL 3 MG MT LOZG: 1.0000 | LOZENGE | OROMUCOSAL | Status: DC | PRN

## 2019-03-04 MED ORDER — BUPIVACAINE HCL (PF) 0.25 % IJ SOLN
INTRAMUSCULAR | Status: DC | PRN
  Administered 2019-03-04: 20 mL

## 2019-03-04 MED ORDER — SODIUM CHLORIDE 0.9 % IV SOLN
INTRAVENOUS | Status: DC | PRN
  Administered 2019-03-04: 500 mL

## 2019-03-04 MED ORDER — ONDANSETRON HCL 4 MG PO TABS: 4.0000 mg | ORAL_TABLET | Freq: Four times a day (QID) | ORAL | Status: DC | PRN

## 2019-03-04 MED ORDER — FLUTICASONE PROPIONATE 50 MCG/ACT NA SUSP
1.0000 | Freq: Every day | NASAL | Status: DC | PRN
  Filled 2019-03-04: qty 16

## 2019-03-04 MED ORDER — ACETAMINOPHEN 10 MG/ML IV SOLN
INTRAVENOUS | Status: AC
  Filled 2019-03-04: qty 100

## 2019-03-04 MED ORDER — LACTATED RINGERS IV SOLN
INTRAVENOUS | Status: DC
  Administered 2019-03-04: 10:00:00 via INTRAVENOUS

## 2019-03-04 MED ORDER — AMLODIPINE BESYLATE 5 MG PO TABS
5.0000 mg | ORAL_TABLET | Freq: Every day | ORAL | Status: DC
  Administered 2019-03-05 – 2019-03-08 (×4): 5 mg via ORAL
  Filled 2019-03-04 (×4): qty 1

## 2019-03-04 MED ORDER — SUGAMMADEX SODIUM 200 MG/2ML IV SOLN
INTRAVENOUS | Status: DC | PRN
  Administered 2019-03-04: 133.4 mg via INTRAVENOUS

## 2019-03-04 MED ORDER — OXYCODONE HCL 5 MG PO TABS: 5.0000 mg | ORAL_TABLET | Freq: Once | ORAL | Status: DC | PRN

## 2019-03-04 MED ORDER — SODIUM CHLORIDE 0.9 % IV SOLN
250.0000 mL | INTRAVENOUS | Status: DC
  Administered 2019-03-06: 250 mL via INTRAVENOUS

## 2019-03-04 MED ORDER — ASPIRIN EC 81 MG PO TBEC
81.0000 mg | DELAYED_RELEASE_TABLET | Freq: Every day | ORAL | Status: DC
  Administered 2019-03-04 – 2019-03-08 (×5): 81 mg via ORAL
  Filled 2019-03-04 (×5): qty 1

## 2019-03-04 MED ORDER — LACTATED RINGERS IV SOLN
INTRAVENOUS | Status: DC | PRN
  Administered 2019-03-04: 11:00:00 via INTRAVENOUS

## 2019-03-04 MED ORDER — FLEET ENEMA 7-19 GM/118ML RE ENEM: 1.0000 | ENEMA | Freq: Once | RECTAL | Status: DC | PRN

## 2019-03-04 MED ORDER — PROPOFOL 10 MG/ML IV BOLUS
INTRAVENOUS | Status: DC | PRN
  Administered 2019-03-04: 110 mg via INTRAVENOUS

## 2019-03-04 MED ORDER — ACETAMINOPHEN 325 MG PO TABS
650.0000 mg | ORAL_TABLET | ORAL | Status: DC | PRN
  Administered 2019-03-05: 650 mg via ORAL
  Filled 2019-03-04: qty 2

## 2019-03-04 MED ORDER — THROMBIN 5000 UNITS EX SOLR
CUTANEOUS | Status: AC
  Filled 2019-03-04: qty 5000

## 2019-03-04 MED ORDER — OXYCODONE HCL 5 MG PO TABS
10.0000 mg | ORAL_TABLET | ORAL | Status: DC | PRN
  Administered 2019-03-04 – 2019-03-06 (×3): 10 mg via ORAL
  Filled 2019-03-04 (×2): qty 2

## 2019-03-04 MED ORDER — SODIUM CHLORIDE 0.9% FLUSH: 3.0000 mL | INTRAVENOUS | Status: DC | PRN

## 2019-03-04 MED ORDER — SUCCINYLCHOLINE CHLORIDE 20 MG/ML IJ SOLN
INTRAMUSCULAR | Status: DC | PRN
  Administered 2019-03-04: 80 mg via INTRAVENOUS

## 2019-03-04 MED ORDER — DEXAMETHASONE SODIUM PHOSPHATE 4 MG/ML IJ SOLN
INTRAMUSCULAR | Status: DC | PRN
  Administered 2019-03-04: 5 mg via INTRAVENOUS

## 2019-03-04 MED ORDER — PHENOL 1.4 % MT LIQD: 1.0000 | OROMUCOSAL | Status: DC | PRN

## 2019-03-04 MED ORDER — DULOXETINE HCL 60 MG PO CPEP
60.0000 mg | ORAL_CAPSULE | Freq: Every day | ORAL | Status: DC
  Administered 2019-03-04 – 2019-03-06 (×3): 60 mg via ORAL
  Filled 2019-03-04 (×3): qty 1

## 2019-03-04 MED ORDER — FOLIC ACID 1 MG PO TABS
1.0000 mg | ORAL_TABLET | Freq: Every day | ORAL | Status: DC
  Administered 2019-03-04 – 2019-03-08 (×5): 1 mg via ORAL
  Filled 2019-03-04 (×5): qty 1

## 2019-03-04 MED ORDER — LORATADINE 10 MG PO TABS
10.0000 mg | ORAL_TABLET | Freq: Every day | ORAL | Status: DC
  Administered 2019-03-05 – 2019-03-08 (×4): 10 mg via ORAL
  Filled 2019-03-04 (×4): qty 1

## 2019-03-04 MED ORDER — HYDROCODONE-ACETAMINOPHEN 10-325 MG PO TABS: 1.0000 | ORAL_TABLET | Freq: Four times a day (QID) | ORAL | Status: DC | PRN

## 2019-03-04 MED ORDER — ACETAMINOPHEN 10 MG/ML IV SOLN
1000.0000 mg | Freq: Once | INTRAVENOUS | Status: DC | PRN
  Administered 2019-03-04: 1000 mg via INTRAVENOUS

## 2019-03-04 MED ORDER — PANTOPRAZOLE SODIUM 40 MG PO TBEC
40.0000 mg | DELAYED_RELEASE_TABLET | Freq: Two times a day (BID) | ORAL | Status: DC
  Administered 2019-03-04 – 2019-03-08 (×9): 40 mg via ORAL
  Filled 2019-03-04 (×9): qty 1

## 2019-03-04 MED ORDER — METOPROLOL SUCCINATE ER 50 MG PO TB24
50.0000 mg | ORAL_TABLET | Freq: Every day | ORAL | Status: DC
  Administered 2019-03-07 – 2019-03-08 (×2): 50 mg via ORAL
  Filled 2019-03-04 (×2): qty 1

## 2019-03-04 MED ORDER — POLYETHYLENE GLYCOL 3350 17 G PO PACK: 17.0000 g | PACK | Freq: Every day | ORAL | Status: DC | PRN

## 2019-03-04 MED ORDER — OXYCODONE HCL 5 MG PO TABS
ORAL_TABLET | ORAL | Status: AC
  Filled 2019-03-04: qty 2

## 2019-03-04 MED ORDER — 0.9 % SODIUM CHLORIDE (POUR BTL) OPTIME
TOPICAL | Status: DC | PRN
  Administered 2019-03-04: 12:00:00 1000 mL

## 2019-03-04 MED ORDER — BUPIVACAINE HCL (PF) 0.25 % IJ SOLN
INTRAMUSCULAR | Status: AC
  Filled 2019-03-04: qty 30

## 2019-03-04 MED ORDER — VANCOMYCIN HCL 1 G IV SOLR
INTRAVENOUS | Status: DC | PRN
  Administered 2019-03-04: 1000 mg

## 2019-03-04 MED ORDER — THROMBIN 20000 UNITS EX SOLR
CUTANEOUS | Status: AC
  Filled 2019-03-04: qty 20000

## 2019-03-04 MED ORDER — VANCOMYCIN HCL 1000 MG IV SOLR
INTRAVENOUS | Status: AC
  Filled 2019-03-04: qty 1000

## 2019-03-04 MED ORDER — HYDROMORPHONE HCL 1 MG/ML IJ SOLN: 1.0000 mg | INTRAMUSCULAR | Status: DC | PRN

## 2019-03-04 MED ORDER — ROCURONIUM BROMIDE 50 MG/5ML IV SOSY
PREFILLED_SYRINGE | INTRAVENOUS | Status: DC | PRN
  Administered 2019-03-04 (×2): 20 mg via INTRAVENOUS
  Administered 2019-03-04: 40 mg via INTRAVENOUS

## 2019-03-04 MED ORDER — ONDANSETRON HCL 4 MG/2ML IJ SOLN
INTRAMUSCULAR | Status: DC | PRN
  Administered 2019-03-04: 4 mg via INTRAVENOUS

## 2019-03-04 MED ORDER — ACETAMINOPHEN 650 MG RE SUPP: 650.0000 mg | RECTAL | Status: DC | PRN

## 2019-03-04 SURGICAL SUPPLY — 70 items
ADH SKN CLS APL DERMABOND .7 (GAUZE/BANDAGES/DRESSINGS) ×1
APL SKNCLS STERI-STRIP NONHPOA (GAUZE/BANDAGES/DRESSINGS) ×1
BAG DECANTER FOR FLEXI CONT (MISCELLANEOUS) ×2 IMPLANT
BENZOIN TINCTURE PRP APPL 2/3 (GAUZE/BANDAGES/DRESSINGS) ×2 IMPLANT
BLADE CLIPPER SURG (BLADE) IMPLANT
BUR CUTTER 7.0 ROUND (BURR) IMPLANT
BUR MATCHSTICK NEURO 3.0 LAGG (BURR) ×2 IMPLANT
CANISTER SUCT 3000ML PPV (MISCELLANEOUS) ×2 IMPLANT
CAP LCK SPNE (Orthopedic Implant) ×6 IMPLANT
CAP LOCK SPINE RADIUS (Orthopedic Implant) IMPLANT
CAP LOCKING (Orthopedic Implant) ×12 IMPLANT
CARTRIDGE OIL MAESTRO DRILL (MISCELLANEOUS) ×1 IMPLANT
CLSR STERI-STRIP ANTIMIC 1/2X4 (GAUZE/BANDAGES/DRESSINGS) ×1 IMPLANT
CONT SPEC 4OZ CLIKSEAL STRL BL (MISCELLANEOUS) ×2 IMPLANT
COVER BACK TABLE 60X90IN (DRAPES) ×2 IMPLANT
COVER WAND RF STERILE (DRAPES) ×2 IMPLANT
DECANTER SPIKE VIAL GLASS SM (MISCELLANEOUS) ×2 IMPLANT
DERMABOND ADVANCED (GAUZE/BANDAGES/DRESSINGS) ×1
DERMABOND ADVANCED .7 DNX12 (GAUZE/BANDAGES/DRESSINGS) ×1 IMPLANT
DEVICE INTERBODY ELEVATE 23X9 (Cage) ×2 IMPLANT
DEVICE INTERBODY ELEVATE 9X23 (Cage) ×2 IMPLANT
DIFFUSER DRILL AIR PNEUMATIC (MISCELLANEOUS) ×2 IMPLANT
DRAPE C-ARM 42X72 X-RAY (DRAPES) ×4 IMPLANT
DRAPE HALF SHEET 40X57 (DRAPES) ×1 IMPLANT
DRAPE LAPAROTOMY 100X72X124 (DRAPES) ×2 IMPLANT
DRAPE SURG 17X23 STRL (DRAPES) ×8 IMPLANT
DRSG OPSITE POSTOP 4X6 (GAUZE/BANDAGES/DRESSINGS) ×2 IMPLANT
DURAPREP 26ML APPLICATOR (WOUND CARE) ×2 IMPLANT
ELECT REM PT RETURN 9FT ADLT (ELECTROSURGICAL) ×2
ELECTRODE REM PT RTRN 9FT ADLT (ELECTROSURGICAL) ×1 IMPLANT
EVACUATOR 1/8 PVC DRAIN (DRAIN) IMPLANT
GAUZE 4X4 16PLY RFD (DISPOSABLE) IMPLANT
GAUZE SPONGE 4X4 12PLY STRL (GAUZE/BANDAGES/DRESSINGS) IMPLANT
GLOVE BIO SURGEON STRL SZ 6.5 (GLOVE) ×1 IMPLANT
GLOVE BIOGEL PI IND STRL 6.5 (GLOVE) IMPLANT
GLOVE BIOGEL PI IND STRL 7.0 (GLOVE) IMPLANT
GLOVE BIOGEL PI IND STRL 7.5 (GLOVE) IMPLANT
GLOVE BIOGEL PI INDICATOR 6.5 (GLOVE) ×2
GLOVE BIOGEL PI INDICATOR 7.0 (GLOVE) ×2
GLOVE BIOGEL PI INDICATOR 7.5 (GLOVE) ×3
GLOVE ECLIPSE 9.0 STRL (GLOVE) ×4 IMPLANT
GLOVE EXAM NITRILE XL STR (GLOVE) IMPLANT
GLOVE SURG SS PI 6.0 STRL IVOR (GLOVE) ×1 IMPLANT
GLOVE SURG SS PI 7.0 STRL IVOR (GLOVE) ×6 IMPLANT
GOWN STRL REUS W/ TWL LRG LVL3 (GOWN DISPOSABLE) IMPLANT
GOWN STRL REUS W/ TWL XL LVL3 (GOWN DISPOSABLE) ×2 IMPLANT
GOWN STRL REUS W/TWL 2XL LVL3 (GOWN DISPOSABLE) IMPLANT
GOWN STRL REUS W/TWL LRG LVL3 (GOWN DISPOSABLE) ×10
GOWN STRL REUS W/TWL XL LVL3 (GOWN DISPOSABLE) ×4
KIT BASIN OR (CUSTOM PROCEDURE TRAY) ×2 IMPLANT
KIT TURNOVER KIT B (KITS) ×2 IMPLANT
MILL MEDIUM DISP (BLADE) ×2 IMPLANT
NEEDLE HYPO 22GX1.5 SAFETY (NEEDLE) ×2 IMPLANT
NS IRRIG 1000ML POUR BTL (IV SOLUTION) ×2 IMPLANT
OIL CARTRIDGE MAESTRO DRILL (MISCELLANEOUS) ×2
PACK LAMINECTOMY NEURO (CUSTOM PROCEDURE TRAY) ×2 IMPLANT
ROD 5.5X60MM GREEN (Rod) ×2 IMPLANT
SCREW 5.75 X 635 (Screw) ×2 IMPLANT
SCREW 5.75X40M (Screw) ×4 IMPLANT
SPONGE LAP 4X18 RFD (DISPOSABLE) ×1 IMPLANT
SPONGE SURGIFOAM ABS GEL 100 (HEMOSTASIS) ×2 IMPLANT
STRIP CLOSURE SKIN 1/2X4 (GAUZE/BANDAGES/DRESSINGS) ×4 IMPLANT
SUT VIC AB 0 CT1 18XCR BRD8 (SUTURE) ×2 IMPLANT
SUT VIC AB 0 CT1 8-18 (SUTURE) ×4
SUT VIC AB 2-0 CT1 18 (SUTURE) ×3 IMPLANT
SUT VIC AB 3-0 SH 8-18 (SUTURE) ×4 IMPLANT
TOWEL GREEN STERILE (TOWEL DISPOSABLE) ×2 IMPLANT
TOWEL GREEN STERILE FF (TOWEL DISPOSABLE) ×2 IMPLANT
TRAY FOLEY MTR SLVR 16FR STAT (SET/KITS/TRAYS/PACK) ×2 IMPLANT
WATER STERILE IRR 1000ML POUR (IV SOLUTION) ×2 IMPLANT

## 2019-03-04 NOTE — Plan of Care (Signed)

## 2019-03-04 NOTE — Anesthesia Postprocedure Evaluation (Signed)
Anesthesia Post Note  Patient: Jodi Bowman  Procedure(s) Performed: POSTERIOR LUMBAR INTERBODY FUSION - LUMBAR FOUR-LUMBAR FIVE - LUMBAR FIVE-SACRAL ONE - (N/A Back)     Patient location during evaluation: PACU Anesthesia Type: General Level of consciousness: awake and alert Pain management: pain level controlled Vital Signs Assessment: post-procedure vital signs reviewed and stable Respiratory status: spontaneous breathing, nonlabored ventilation and respiratory function stable Cardiovascular status: blood pressure returned to baseline and stable Postop Assessment: no apparent nausea or vomiting Anesthetic complications: no    Last Vitals:  Vitals:   03/04/19 1447 03/04/19 1514  BP: 111/67 (!) 150/74  Pulse: (!) 49 (!) 45  Resp: 12 16  Temp: 36.6 C   SpO2: 100% 100%    Last Pain:  Vitals:   03/04/19 1445  TempSrc:   PainSc: [place]

## 2019-03-04 NOTE — Anesthesia Procedure Notes (Addendum)
Procedure Name: Intubation Date/Time: 03/04/2019 10:37 AM Performed by: Glynda Jaeger, CRNA Pre-anesthesia Checklist: Patient identified, Patient being monitored, Timeout performed, Emergency Drugs available and Suction available Patient Re-evaluated:Patient Re-evaluated prior to induction Oxygen Delivery Method: Circle System Utilized Preoxygenation: Pre-oxygenation with 100% oxygen Induction Type: IV induction Laryngoscope Size: Mac and 4 Grade View: Grade I Tube type: Oral Tube size: 7.5 mm Number of attempts: 1 Airway Equipment and Method: Stylet Placement Confirmation: ETT inserted through vocal cords under direct vision,  positive ETCO2 and breath sounds checked- equal and bilateral Secured at: 21 cm Tube secured with: Tape Dental Injury: Teeth and Oropharynx as per pre-operative assessment  Comments: RSI

## 2019-03-04 NOTE — Evaluation (Signed)
Physical Therapy Evaluation Patient Details Name: Jodi Bowman MRN: [id number] DOB: 06/29/1961 Today's Date: 03/04/2019   History of Present Illness  Pt is a 58 y/o female s/p L4-S1 PLIF. PMH includes fibromyalgia, HTN, migraines, sleep apnea, and CVA.   Clinical Impression  Patient is s/p above surgery resulting in the deficits listed below (see PT Problem List). Pt limited this session secondary to pain and dizziness. Required mod A to stand using RW and mod A for bed mobility tasks. Educated about back precautions and generalized walking program. Feel pt will progress well once pain controlled, however, if pt unable to progress as needed, may require short term SNF. Patient will benefit from skilled PT to increase their independence and safety with mobility (while adhering to their precautions) to allow discharge to the venue listed below.     Follow Up Recommendations Supervision for mobility/OOB;Other (comment)(TBD pending mobility progression)    Equipment Recommendations  Rolling walker with 5" wheels    Recommendations for Other Services       Precautions / Restrictions Precautions Precautions: Back Precaution Booklet Issued: No Precaution Comments: Verbally reviewed back precautions.  Restrictions Weight Bearing Restrictions: No      Mobility  Bed Mobility Overal bed mobility: Needs Assistance Bed Mobility: Rolling;Sidelying to Sit;Sit to Sidelying Rolling: Mod assist Sidelying to sit: Mod assist     Sit to sidelying: Mod assist General bed mobility comments: Mod A for assist with bed mobility tasks. Required assist for trunk elevation and LE assist.   Transfers Overall transfer level: Needs assistance Equipment used: Rolling walker (2 wheeled) Transfers: Sit to/from Stand Sit to Stand: Mod assist         General transfer comment: Mod A for lift assist and steadying to stand. Cues for safe hand placement. Pt with increased pain and dizziness in standing,  so further mobility deferred.   Ambulation/Gait                Stairs            Wheelchair Mobility    Modified Rankin (Stroke Patients Only)       Balance Overall balance assessment: Needs assistance Sitting-balance support: Bilateral upper extremity supported;Feet supported Sitting balance-Leahy Scale: Poor Sitting balance - Comments: Pt bracing in sitting with UEs secondary to pain.    Standing balance support: Bilateral upper extremity supported Standing balance-Leahy Scale: Poor Standing balance comment: Reliant on BUE support                              Pertinent Vitals/Pain Pain Assessment: 0-10 Pain Score: 8  Pain Location: back Pain Descriptors / Indicators: Aching;Operative site guarding Pain Intervention(s): Limited activity within patient's tolerance;Monitored during session;Repositioned    Home Living Family/patient expects to be discharged to:: Private residence Living Arrangements: Spouse/significant other;Children Available Help at Discharge: Family;Available 24 hours/day Type of Home: House Home Access: Stairs to enter Entrance Stairs-Rails: None Entrance Stairs-Number of Steps: 4 Home Layout: One level Home Equipment: Cane - single point      Prior Function Level of Independence: Independent with assistive device(s)         Comments: Reports use of cane for mobility.      Hand Dominance        Extremity/Trunk Assessment   Upper Extremity Assessment Upper Extremity Assessment: Defer to OT evaluation    Lower Extremity Assessment Lower Extremity Assessment: Generalized weakness    Cervical / Trunk Assessment Cervical /  Trunk Assessment: Other exceptions Cervical / Trunk Exceptions: s/p PLIF   Communication   Communication: No difficulties  Cognition Arousal/Alertness: Awake/alert Behavior During Therapy: WFL for tasks assessed/performed Overall Cognitive Status: Within Functional Limits for tasks  assessed                                        General Comments General comments (skin integrity, edema, etc.): Educated about generalized walking program.     Exercises     Assessment/Plan    PT Assessment Patient needs continued PT services  PT Problem List Decreased strength;Decreased balance;Decreased activity tolerance;Decreased mobility;Decreased knowledge of use of DME;Decreased knowledge of precautions;Pain       PT Treatment Interventions DME instruction;Gait training;Stair training;Therapeutic activities;Functional mobility training;Therapeutic exercise;Balance training;Patient/family education    PT Goals (Current goals can be found in the Care Plan section)  Acute Rehab PT Goals Patient Stated Goal: to decrease pain  PT Goal Formulation: With patient Time For Goal Achievement: 03/18/19 Potential to Achieve Goals: Good    Frequency Min 5X/week   Barriers to discharge        Co-evaluation               AM-PAC PT "6 Clicks" Mobility  Outcome Measure Help needed turning from your back to your side while in a flat bed without using bedrails?: A Lot Help needed moving from lying on your back to sitting on the side of a flat bed without using bedrails?: A Lot Help needed moving to and from a bed to a chair (including a wheelchair)?: A Lot Help needed standing up from a chair using your arms (e.g., wheelchair or bedside chair)?: A Lot Help needed to walk in hospital room?: A Lot Help needed climbing 3-5 steps with a railing? : A Lot 6 Click Score: 12    End of Session Equipment Utilized During Treatment: Gait belt;Back brace Activity Tolerance: Patient limited by pain;Treatment limited secondary to medical complications (Comment)(dizziness) Patient left: in bed;with call bell/phone within reach;with bed alarm set Nurse Communication: Mobility status PT Visit Diagnosis: Other abnormalities of gait and mobility (R26.89);Difficulty in walking,  not elsewhere classified (R26.2);Muscle weakness (generalized) (M62.81);Pain Pain - part of body: (back)    Time: 2836-6294 PT Time Calculation (min) (ACUTE ONLY): 23 min   Charges:   PT Evaluation $PT Eval Moderate Complexity: 1 Mod PT Treatments $Therapeutic Activity: 8-22 mins        Leighton Ruff, PT, DPT  Acute Rehabilitation Services  Pager: [phone] Office: [phone]   Rudean Hitt 03/04/2019, 5:22 PM

## 2019-03-04 NOTE — Progress Notes (Signed)
Orthopedic Tech Progress Note Patient Details:  Jodi Bowman 1961-01-21 [id number] Patient was before fit before surgery. She has brace. Patient ID: Jodi Bowman, female   DOB: 1960/11/18, 57 y.o.   MRN: [id number]   [name] 03/04/2019, 4:34 PM

## 2019-03-04 NOTE — Transfer of Care (Signed)
Immediate Anesthesia Transfer of Care Note  Patient: Jodi Bowman  Procedure(s) Performed: POSTERIOR LUMBAR INTERBODY FUSION - LUMBAR FOUR-LUMBAR FIVE - LUMBAR FIVE-SACRAL ONE - (N/A Back)  Patient Location: PACU  Anesthesia Type:General  Level of Consciousness: awake, patient cooperative and responds to stimulation  Airway & Oxygen Therapy: Patient Spontanous Breathing and Patient connected to face mask oxygen  Post-op Assessment: Report given to RN, Post -op Vital signs reviewed and stable and Patient moving all extremities X 4  Post vital signs: Reviewed and stable  Last Vitals:  Vitals Value Taken Time  BP 158/77 03/04/2019  2:02 PM  Temp    Pulse 59 03/04/2019  2:03 PM  Resp 14 03/04/2019  2:03 PM  SpO2 100 % 03/04/2019  2:03 PM  Vitals shown include unvalidated device data.  Last Pain:  Vitals:   03/04/19 0958  TempSrc:   PainSc: 7       Patients Stated Pain Goal: 3 (69/50/72 2575)  Complications: No apparent anesthesia complications

## 2019-03-04 NOTE — H&P (Signed)
Jodi Bowman is an 58 y.o. female.   Chief Complaint: Back pain HPI: 58 year old female with chronic intractable back pain failing all conservative management.  Work-up demonstrates evidence of marked disc generation with the space collapse and significant foraminal stenosis at L4-5 and L5-S1.  Patient presents now for two-level lumbar decompression and fusion in hopes of improving her symptoms.  Past Medical History:  Diagnosis Date  . Arthritis   . Calculus of gallbladder without mention of cholecystitis or obstruction   . Dizziness   . Fibromyalgia   . GERD (gastroesophageal reflux disease)   . Goiter   . Hypercholesteremia   . Hypertension   . Lower back pain   . Migraine   . Nontoxic uninodular goiter    sees dr vollmer at [facility]  . Obesity   . PONV (postoperative nausea and vomiting)   . Sleep apnea    STOPBANG=5  . Stroke [facility]) 2000    Past Surgical History:  Procedure Laterality Date  . ABDOMINAL HYSTERECTOMY    . BOWEL RESECTION N/A 03/06/2018   Procedure: SMALL BOWEL ANASTAMOSIS;  Surgeon: Clovis Riley, MD;  Location: [place];  Service: General;  Laterality: N/A;  . CATARACT EXTRACTION W/ INTRAOCULAR LENS IMPLANT Bilateral   . CESAREAN SECTION  yrs ago   done x 2  . CHOLECYSTECTOMY  01/05/2012   Procedure: LAPAROSCOPIC CHOLECYSTECTOMY WITH INTRAOPERATIVE CHOLANGIOGRAM;  Surgeon: Pedro Earls, MD;  Location: [facility];  Service: General;  Laterality: N/A;  . COLONOSCOPY  10/08/2012   Procedure: COLONOSCOPY;  Surgeon: Beryle Beams, MD;  Location: [facility];  Service: Endoscopy;  Laterality: N/A;  . KNEE ARTHROSCOPY  one 1995 and 1 in 1997   both knees done  . LAPAROSCOPY N/A 03/06/2018   Procedure: LAPAROSCOPY DIAGNOSTIC WITH LYSIS OF ADHESIONS;  Surgeon: Clovis Riley, MD;  Location: [place];  Service: General;  Laterality: N/A;  . LAPAROTOMY N/A 03/06/2018   Procedure: EXPLORATORY LAPAROTOMY, RESECTION OF DISTAL ROUX, CLOSURE OF INTERNAL HERNIA;   Surgeon: Clovis Riley, MD;  Location: [place];  Service: General;  Laterality: N/A;  . surgery for endometriosis  yrs ago  . thryoid biopsy  December 01, 2011    at [facility]    Family History  Problem Relation Age of Onset  . Diabetes Mother   . Hypertension Mother   . Transient ischemic attack Mother   . Seizures Mother   . Dementia Mother   . Other Father        MVA  . Cancer Brother        colon and lung  . Cancer Maternal Grandmother        colon   Social History:  reports that she has never smoked. She has never used smokeless tobacco. She reports previous alcohol use. She reports that she does not use drugs.  Allergies:  Allergies  Allergen Reactions  . Shrimp [Shellfish Allergy] Anaphylaxis  . Naproxen Other (See Comments)    Makes stomach cramp and burning badly.    Medications Prior to Admission  Medication Sig Dispense Refill  . aMILoride (MIDAMOR) 5 MG tablet Take 5 mg by mouth daily.    Marland Kitchen amLODipine (NORVASC) 5 MG tablet Take 1 tablet (5 mg total) by mouth daily. 30 tablet 1  . aspirin EC 81 MG tablet Take 81 mg by mouth daily.    . Cholecalciferol 50000 units capsule Take 50,000 Units by mouth 3 (three) times a week.     . DULoxetine (  CYMBALTA) 60 MG capsule Take 60 mg by mouth daily.    . folic acid (FOLVITE) 1 MG tablet Take 1 mg by mouth daily.  11  . HYDROcodone-acetaminophen (NORCO) 10-325 MG tablet Take 1 tablet by mouth every 6 (six) hours as needed for moderate pain.     Marland Kitchen loratadine (CLARITIN) 10 MG tablet Take 10 mg by mouth daily.     Marland Kitchen losartan (COZAAR) 100 MG tablet Take 100 mg by mouth daily.    Marland Kitchen LYRICA 25 MG capsule Take 25 mg by mouth 3 (three) times daily.   3  . metoprolol succinate (TOPROL-XL) 50 MG 24 hr tablet TAKE 1 TABLET ER 24HR DAILY ORAL (Patient taking differently: Take 50 mg by mouth daily. ) 30 tablet 3  . nitroGLYCERIN (NITROSTAT) 0.4 MG SL tablet Place 1 tablet (0.4 mg total) under the tongue every 5 (five) minutes x 3 doses as needed  for chest pain. (Patient taking differently: Place 0.4 mg under the tongue every 5 (five) minutes as needed for chest pain. Maximum 3 doses) 25 tablet 12  . pantoprazole (PROTONIX) 40 MG tablet Take 40 mg by mouth 2 (two) times daily.     . potassium chloride SA (K-DUR,KLOR-CON) 20 MEQ tablet Take 1 tablet (20 mEq total) by mouth 2 (two) times daily. 20 tablet 0  . triamcinolone cream (KENALOG) 0.1 % Apply 1 application topically 2 (two) times daily as needed for rash.    . benzonatate (TESSALON) 100 MG capsule Take 1 capsule (100 mg total) by mouth 3 (three) times daily as needed for cough. (Patient not taking: Reported on 03/01/2019) 21 capsule 0  . celecoxib (CELEBREX) 100 MG capsule Take 1 capsule (100 mg total) by mouth 2 (two) times daily. (Patient not taking: Reported on 03/01/2019) 30 capsule 0  . diclofenac sodium (VOLTAREN) 1 % GEL Apply 4 g topically 4 (four) times daily. (Patient not taking: Reported on 03/01/2019) 100 g 0  . famotidine (PEPCID) 20 MG tablet Take 1 tablet (20 mg total) by mouth 2 (two) times daily for 5 days. (Patient not taking: Reported on 03/01/2019) 10 tablet 0  . fluticasone (FLONASE) 50 MCG/ACT nasal spray Place 1 spray into both nostrils daily as needed for allergies.   0  . HYDROcodone-homatropine (HYCODAN) 5-1.5 MG/5ML syrup Take 5 mLs by mouth every 6 (six) hours as needed for cough. (Patient not taking: Reported on 03/01/2019) 100 mL 0  . lidocaine (LIDODERM) 5 % Place 1 patch onto the skin daily. Remove & Discard patch within 12 hours or as directed by MD (Patient not taking: Reported on 03/01/2019) 30 patch 0  . methocarbamol (ROBAXIN) 500 MG tablet Take 1 tablet (500 mg total) by mouth 2 (two) times daily. (Patient not taking: Reported on 03/01/2019) 20 tablet 0  . oxyCODONE-acetaminophen (PERCOCET) 5-325 MG tablet Take 1-2 tablets by mouth every 4 (four) hours as needed for moderate pain. (Patient not taking: Reported on 03/01/2019) 20 tablet 0    No results found for this  or any previous visit (from the past 48 hour(s)). No results found.  Pertinent items noted in HPI and remainder of comprehensive ROS otherwise negative.  Blood pressure (!) 197/77, pulse (!) 47, temperature 97.9 F (36.6 C), temperature source Oral, resp. rate 18, height 4\' 11"  (1.499 m), weight 66.7 kg, SpO2 100 %.  Patient is awake and alert.  She is oriented and appropriate.  Cranial nerve function intact.  Speech fluent.  Judgment insight intact.  Examination head ears  eyes nose throat some is unremarkable.  Chest and abdomen are obese but otherwise benign.  Extremities free from injury deformity.  Motor exam of the extremities reveals intact motor strength bilateral.  Sensory examination with some mild distal sensory loss in L5 and S1 dermatomes.  Deep tender because are hypoactive in both patella and absent in both Achilles.  Gait is antalgic.  Posture is mildly flexed.   Assessment/Plan L4-5, L5S1 degenerative disc disease with disc space collapse and severe foraminal stenosis.  Plan bilateral L4-5 and L5-S1 decompressive laminotomies and foraminotomies followed by posterior lumbar interbody fusion utilizing interbody cages, locally harvested autograft, and augmented with posterior lateral arthrodesis utilizing segmental pedicle screw fixation and local autografting.  Risks and benefits been explained.  Patient wishes to proceed.  Mallie Mussel A Rebbeca Sheperd 03/04/2019, 9:57 AM

## 2019-03-04 NOTE — Brief Op Note (Signed)
03/04/2019  1:49 PM  PATIENT:  Jodi Bowman  58 y.o. female  PRE-OPERATIVE DIAGNOSIS:  Degenerative disk disease - stenosis  POST-OPERATIVE DIAGNOSIS:  Degenerative disk disease - stenosis  PROCEDURE:  Procedure(s): POSTERIOR LUMBAR INTERBODY FUSION - LUMBAR FOUR-LUMBAR FIVE - LUMBAR FIVE-SACRAL ONE - (N/A)  SURGEON:  Surgeon(s) and Role:    * Earnie Larsson, MD - Primary  PHYSICIAN ASSISTANT:   ASSISTANTSReinaldo Meeker, NP   ANESTHESIA:   general  EBL:  175 mL   BLOOD ADMINISTERED:none  DRAINS: none   LOCAL MEDICATIONS USED:  MARCAINE     SPECIMEN:  No Specimen  DISPOSITION OF SPECIMEN:  N/A  COUNTS:  YES  TOURNIQUET:  * No tourniquets in log *  DICTATION: .Dragon Dictation  PLAN OF CARE: Admit to inpatient   PATIENT DISPOSITION:  PACU - hemodynamically stable.   Delay start of Pharmacological VTE agent (>24hrs) due to surgical blood loss or risk of bleeding: yes

## 2019-03-04 NOTE — Op Note (Signed)
Date of procedure: 03/04/2019  Date of dictation: Same  Service: Neurosurgery  Preoperative diagnosis: L4-5, L5-S1 degenerative disc disease with foraminal stenosis and chronic back and radiculopathy  Postoperative diagnosis: Same  Procedure Name: Bilateral L4-5 and L5-S1 decompressive laminotomies and foraminotomies,, more than would be required for simple interbody fusion alone.  L4-5, L5-S1 posterior lumbar interbody fusion utilizing interbody cages and locally harvested autograft  L4-5 S1 posterior lateral arthrodesis utilizing segmental pedicle screw fixation and local autograft   Surgeon:Verley Pariseau A.Veneta Sliter, M.D.  Asst. Surgeon: Reinaldo Meeker, NP  Anesthesia: General  Indication: 58 year old female with intractable back pain and bilateral lower extremity symptoms failing conservative management.  Work-up demonstrates evidence of significant disc generation with disc space collapse and broad-based disc bulging with associated foraminal stenosis.  Patient is failed all efforts of conservative management including chronic pain management.  She presents now for two-level lumbar decompression and fusion in hopes of improving her symptoms.  Operative note: After induction anesthesia, patient position prone onto Wilson frame and appropriate padded.  Lumbar region prepped and draped sterilely.  Incision made overlying L4-5 and S1.  Dissection performed bilaterally.  Retractor placed.  Fluoroscopy used.  Levels confirmed.  Laminotomies then performed using Leksell Camille Bal to remove the inferior aspect of the lamina of L4, the entire inferior facet of L4 and the majority of the superior facet of L5 and the superior aspect the L5 lamina.  This was then repeated at L5-S1.  The bridge of bone across the L5 lamina was so thin that it eccentrically cracked and I resected it completely completing a total laminectomy of L5.  Epidural was was coagulated and cut.  Bilateral discectomies then performed at  L4-5 and L5-S1.  The space was then prepared for interbody fusion.  With distractor placed the patient's right side the space cleaned of all soft tissue.  At L4-5 a 9 mm standard expandable cage was packed with locally harvested autograft and packed into place.  This is then expanded to its full extent.  Distractor removed patient's right side.  The space prepared for the inner body fusion.  Morselized autograft was then packed into the interspace.  A second cage was then impacted in place and expanded to its full extent.  Procedure then repeated at L5-S1 utilizing 9 mm extra lordotic cage and local autografting.  Pedicles at L4-5 and S1 were identified using surface landmarks and intraoperative fluoroscopy superficial bone around the pedicles and removed his high-speed drill produce pedicles then probed using pedicle all each pedicle tract was then probed and found to be solid within the bone.  Each pedicle tract was then tapped and then 5.75 mm radius bran screws from [employer] were placed bilaterally at L4-5 and S1.  Final images reveal good position of the cages and the hardware at the proper upper levels normal alignment of spine.  Gelfoam was placed topically over the laminotomy defects.  Transverse processes and sacral ala were decorticated using high-speed drill.  Morselized autograft was packed posterior laterally.  Short segment of titanium rods and contoured and placed over the screws at L4-5 and S1.  Lockincaps placed over the screws.  Locking caps then engaged with a construct under compression.  Vancomycin powder placed in the deep wound space.  Wounds and closed in layers of Vicryl sutures.  Steri-Strips and sterile dressing were applied.  No apparent complications.  Patient tolerated the procedure well and she returns to the recovery room postop.

## 2019-03-05 LAB — CBC
HCT: 30.6 % — ABNORMAL LOW (ref 36.0–46.0)
Hemoglobin: 9.9 g/dL — ABNORMAL LOW (ref 12.0–15.0)
MCH: 29.8 pg (ref 26.0–34.0)
MCHC: 32.4 g/dL (ref 30.0–36.0)
MCV: 92.2 fL (ref 80.0–100.0)
Platelets: 184 10*3/uL (ref 150–400)
RBC: 3.32 MIL/uL — ABNORMAL LOW (ref 3.87–5.11)
RDW: 14.4 % (ref 11.5–15.5)
WBC: 15.9 10*3/uL — ABNORMAL HIGH (ref 4.0–10.5)
nRBC: 0 % (ref 0.0–0.2)

## 2019-03-05 MED ORDER — SODIUM CHLORIDE 0.9 % IV BOLUS
500.0000 mL | Freq: Once | INTRAVENOUS | Status: AC
  Administered 2019-03-05: 500 mL via INTRAVENOUS

## 2019-03-05 NOTE — Progress Notes (Addendum)
Presyncopal episode with dizziness and diaphoresis- now resolved.  While up to chair to change bed, patient began to get diaphoretic with eyes closing.  Lift patient back to bed x 3 nurses.  After in bed, began to feel better, BP 123/60, HR 58 Sat 99% RA.  Blood sugar 151.   Afebrile.  Patient now denies complaints, eating a [place] sandwich, drinking Coke.  Instructions to remain in bed tonight.  Oxygen 2 liters on for comfort.  Continue assessments.

## 2019-03-05 NOTE — Evaluation (Signed)
Occupational Therapy Evaluation Patient Details Name: Jodi Bowman MRN: [id number] DOB: 1961-05-10 Today's Date: 03/05/2019    History of Present Illness Pt is a 58 y/o female s/p L4-S1 PLIF. PMH includes fibromyalgia, HTN, migraines, sleep apnea, and CVA.    Clinical Impression   Pt admitted with the above diagnoses and presents with below problem list. Pt will benefit from continued acute OT to address the below listed deficits and maximize independence with basic ADLs prior to d/c to venue below. PTA pt was mod I with ADLs, used a cane for mobility. Pt currently mod-max A with LB ADLs, +2 for safety for functional mobility. Pt with syncopal episode while turning to sit on toilet after walking into bathroom. Recliner brought to pt and she completed stand-pivot to transfer to the recliner. Vitals assessed: bp 111/60, HR 58, O2 on RA 100. Nursing notified. Pt reported feeling better at end of session but still a bit lightheaded. Depending on pt progress may need to consider ST SNF for rehab prior to heading home. Will follow and update recommendations as needed.      Follow Up Recommendations  Home health OT;Supervision/Assistance - 24 hour    Equipment Recommendations  3 in 1 bedside commode    Recommendations for Other Services       Precautions / Restrictions Precautions Precautions: Back Precaution Booklet Issued: No Required Braces or Orthoses: Spinal Brace Spinal Brace: Lumbar corset;Applied in sitting position Restrictions Weight Bearing Restrictions: No Other Position/Activity Restrictions: monitor HR- has been braycardic this admission. syncopal episode walking in the bathroom on OT eval      Mobility Bed Mobility Overal bed mobility: Needs Assistance Bed Mobility: Rolling;Sidelying to Sit Rolling: Mod assist Sidelying to sit: Mod assist       General bed mobility comments: Mod A for assist with bed mobility tasks. Required assist for trunk elevation and LE  assist.   Transfers Overall transfer level: Needs assistance Equipment used: Rolling walker (2 wheeled) Transfers: Sit to/from [facility] Sit to Stand: Mod assist Stand pivot transfers: Mod assist       General transfer comment: Mod A to steady and assit during powerup. Cues for hand placment and upright posture. Assist to control descent into sitting position    Balance Overall balance assessment: Needs assistance Sitting-balance support: Bilateral upper extremity supported;Feet supported Sitting balance-Leahy Scale: Poor Sitting balance - Comments: Pt bracing in sitting with UEs secondary to pain.    Standing balance support: Bilateral upper extremity supported Standing balance-Leahy Scale: Poor Standing balance comment: Reliant on BUE support                            ADL either performed or assessed with clinical judgement   ADL Overall ADL's : Needs assistance/impaired Eating/Feeding: Set up;Sitting   Grooming: Set up;Minimal assistance;Sitting   Upper Body Bathing: Moderate assistance;Sitting   Lower Body Bathing: Maximal assistance;Sit to/from stand   Upper Body Dressing : Moderate assistance;Sitting   Lower Body Dressing: Maximal assistance;Sit to/from stand   Toilet Transfer: Moderate assistance;+2 for safety/equipment;Ambulation;RW(3n1 over toilet)   Toileting- Clothing Manipulation and Hygiene: Maximal assistance;Sit to/from stand   Tub/ Banker: Moderate assistance;+2 for safety/equipment;Ambulation;3 in 1;Rolling walker   Functional mobility during ADLs: Moderate assistance;+2 for safety/equipment;Rolling walker General ADL Comments: Pt completed bed mobility, ambualted to bathroom where she had a syncopal episode, Completed toilet transfer then SPT to recliner and vitals assessed.      Vision  Perception     Praxis      Pertinent Vitals/Pain Pain Assessment: Faces Faces Pain Scale: Hurts even  more Pain Location: back during transfers/mobility Pain Descriptors / Indicators: Aching;Operative site guarding;Grimacing;Guarding Pain Intervention(s): Premedicated before session;Limited activity within patient's tolerance;Monitored during session;Repositioned     Hand Dominance     Extremity/Trunk Assessment Upper Extremity Assessment Upper Extremity Assessment: Generalized weakness   Lower Extremity Assessment Lower Extremity Assessment: Defer to PT evaluation   Cervical / Trunk Assessment Cervical / Trunk Assessment: Other exceptions Cervical / Trunk Exceptions: s/p PLIF    Communication Communication Communication: No difficulties   Cognition Arousal/Alertness: Awake/alert Behavior During Therapy: WFL for tasks assessed/performed Overall Cognitive Status: Within Functional Limits for tasks assessed                                     General Comments       Exercises     Shoulder Instructions      Home Living Family/patient expects to be discharged to:: Private residence Living Arrangements: Spouse/significant other Available Help at Discharge: Family;Available 24 hours/day Type of Home: House Home Access: Stairs to enter [employer] of Steps: 4 Entrance Stairs-Rails: None Home Layout: One level     Bathroom Shower/Tub: Teacher, early years/pre: Standard     Home Equipment: Cane - single point          Prior Functioning/Environment Level of Independence: Independent with assistive device(s)        Comments: Reports use of cane for mobility.         OT Problem List: Decreased strength;Decreased activity tolerance;Impaired balance (sitting and/or standing);Decreased knowledge of use of DME or AE;Decreased knowledge of precautions;Cardiopulmonary status limiting activity;Pain      OT Treatment/Interventions: Self-care/ADL training;DME and/or AE instruction;Therapeutic activities;Patient/family education;Balance  training    OT Goals(Current goals can be found in the care plan section) Acute Rehab OT Goals Patient Stated Goal: to decrease pain  OT Goal Formulation: With patient Time For Goal Achievement: 03/19/19 Potential to Achieve Goals: Good ADL Goals Pt Will Perform Grooming: with set-up;sitting Pt Will Perform Upper Body Bathing: with set-up;sitting Pt Will Perform Lower Body Bathing: sit to/from stand;with min guard assist Pt Will Perform Upper Body Dressing: with set-up;sitting Pt Will Perform Lower Body Dressing: with min guard assist;sit to/from stand Pt Will Transfer to Toilet: with min guard assist;ambulating Pt Will Perform Toileting - Clothing Manipulation and hygiene: with min guard assist;with set-up;sitting/lateral leans;sit to/from stand Pt Will Perform Tub/Shower Transfer: with min guard assist;ambulating;3 in 1;rolling walker Additional ADL Goal #1: Pt will complete bed mobility at supervision level to prepare for OOB ADLs.  OT Frequency: Min 2X/week   Barriers to D/C:            Co-evaluation              AM-PAC OT "6 Clicks" Daily Activity     Outcome Measure Help from another person eating meals?: None Help from another person taking care of personal grooming?: A Lot Help from another person toileting, which includes using toliet, bedpan, or urinal?: A Lot Help from another person bathing (including washing, rinsing, drying)?: A Lot Help from another person to put on and taking off regular upper body clothing?: A Lot Help from another person to put on and taking off regular lower body clothing?: A Lot 6 Click Score: 14   End of Session  Equipment Utilized During Treatment: Rolling walker;Back brace Nurse Communication: Mobility status;Precautions(syncopal episode, vitals)  Activity Tolerance: Other (comment)(syncopal episode in bathroom while turning to sit on toilet) Patient left: in chair;with call bell/phone within reach  OT Visit Diagnosis: Unsteadiness  on feet (R26.81);Muscle weakness (generalized) (M62.81);Pain                Time: 8473-0856 OT Time Calculation (min): 27 min Charges:  OT General Charges $OT Visit: 1 Visit OT Evaluation $OT Eval Low Complexity: [place], OT Acute Rehabilitation Services Pager: [phone] Office: [phone]   Hortencia Pilar 03/05/2019, 11:50 AM

## 2019-03-05 NOTE — Plan of Care (Signed)

## 2019-03-05 NOTE — Plan of Care (Signed)

## 2019-03-05 NOTE — Progress Notes (Addendum)
Pt's HR 57. Meyran, NP paged regarding metoprolol. Awaiting response. Will continue to monitor.

## 2019-03-05 NOTE — Progress Notes (Signed)
Physical Therapy Treatment Patient Details Name: Jodi Bowman MRN: [id number] DOB: 1960-11-18 Today's Date: 03/05/2019    History of Present Illness Pt is a 58 y/o female s/p L4-S1 PLIF. PMH includes fibromyalgia, HTN, migraines, sleep apnea, and CVA.     PT Comments    Pt has been hypotensive and has had a low HR, and asked to be returned to bed upon standing with PT at [facility].  Her light headed feelings are mainly attached to standing but was not feeling quite right in just sitting upright in chair.  Has reasonable fair sit balance today but is definitely not functioning without hands on help for all movement.  Dense vc's are needed as well, possibly due to not feeling well.  Anticipate her need for around the clock care at home which per her report is available, and if not will need to go to SNF.   Follow Up Recommendations  Home health PT;Supervision for mobility/OOB;Supervision/Assistance - 24 hour     Equipment Recommendations  Rolling walker with 5" wheels    Recommendations for Other Services       Precautions / Restrictions Precautions Precautions: Back;Fall Precaution Booklet Issued: No Precaution Comments: Verbally reviewed back precautions.  Required Braces or Orthoses: Spinal Brace Spinal Brace: Lumbar corset;Applied in sitting position Restrictions Weight Bearing Restrictions: No Other Position/Activity Restrictions: monitor HR- has been braycardic this admission. syncopal episode walking in the bathroom on OT eval    Mobility  Bed Mobility Overal bed mobility: Needs Assistance Bed Mobility: Sit to Sidelying         Sit to sidelying: Mod assist General bed mobility comments: assisting for lifting legs onto bed and to reposition her  Transfers Overall transfer level: Needs assistance Equipment used: Rolling walker (2 wheeled);1 person hand held assist Transfers: Sit to/from Stand Sit to Stand: Min assist         General transfer comment: pt asked PT  not to assist too much so she can control the speed of the activity due to pain  Ambulation/Gait Ambulation/Gait assistance: Min assist Gait Distance (Feet): 4 Feet Assistive device: Rolling walker (2 wheeled);1 person hand held assist Gait Pattern/deviations: Step-to pattern;Decreased stride length;Wide base of support;Trunk flexed;Shuffle Gait velocity: reduced Gait velocity interpretation: <1.8 ft/sec, indicate of risk for recurrent falls General Gait Details: sidesteps to the bed   Stairs             Wheelchair Mobility    Modified Rankin (Stroke Patients Only)       Balance Overall balance assessment: Needs assistance Sitting-balance support: Feet supported;Bilateral upper extremity supported Sitting balance-Leahy Scale: Fair Sitting balance - Comments: poor in the chair but fair bedside   Standing balance support: Bilateral upper extremity supported;During functional activity Standing balance-Leahy Scale: Poor                              Cognition Arousal/Alertness: Awake/alert Behavior During Therapy: WFL for tasks assessed/performed Overall Cognitive Status: Within Functional Limits for tasks assessed                                 General Comments: pt is very specific about feeling light headed with standing      Exercises      General Comments General comments (skin integrity, edema, etc.): pt was assisted back to bed with restoration of posture in bed using pillows to  support it      Pertinent Vitals/Pain Pain Assessment: 0-10 Pain Score: 4  Pain Location: spine during STS efforts Pain Descriptors / Indicators: Operative site guarding Pain Intervention(s): Limited activity within patient's tolerance;Monitored during session;Premedicated before session;Repositioned;RN gave pain meds during session    Home Living                      Prior Function            PT Goals (current goals can now be found in  the care plan section) Acute Rehab PT Goals Patient Stated Goal: to get stronger Progress towards PT goals: Progressing toward goals    Frequency    Min 5X/week      PT Plan Current plan remains appropriate    Co-evaluation              AM-PAC PT "6 Clicks" Mobility   Outcome Measure  Help needed turning from your back to your side while in a flat bed without using bedrails?: A Little Help needed moving from lying on your back to sitting on the side of a flat bed without using bedrails?: A Little Help needed moving to and from a bed to a chair (including a wheelchair)?: A Little Help needed standing up from a chair using your arms (e.g., wheelchair or bedside chair)?: A Little Help needed to walk in hospital room?: A Little Help needed climbing 3-5 steps with a railing? : A Lot 6 Click Score: 17    End of Session Equipment Utilized During Treatment: Gait belt;Oxygen;Back brace Activity Tolerance: Patient limited by fatigue;Treatment limited secondary to medical complications (Comment);Patient limited by pain Patient left: in bed;with call bell/phone within reach;with nursing/sitter in room Nurse Communication: Mobility status PT Visit Diagnosis: Other abnormalities of gait and mobility (R26.89);Difficulty in walking, not elsewhere classified (R26.2);Muscle weakness (generalized) (M62.81);Pain Pain - Right/Left: (central) Pain - part of body: (back)     Time: 3151-7616 PT Time Calculation (min) (ACUTE ONLY): 30 min  Charges:  $Gait Training: 8-22 mins $Therapeutic Activity: 8-22 mins                    Ramond Dial 03/05/2019, 4:56 PM  Mee Hives, PT MS Acute Rehab Dept. Number: [place] and [place]

## 2019-03-05 NOTE — Progress Notes (Signed)
Subjective: The patient is alert and pleasant.  Her back is sore.  She looks well.  Objective: Vital signs in last 24 hours: Temp:  [97.5 F (36.4 C)-98 F (36.7 C)] 97.6 F (36.4 C) (06/06 0802) Pulse Rate:  [45-68] 57 (06/06 0802) Resp:  [12-18] 15 (06/06 0802) BP: (107-197)/(52-77) 123/60 (06/06 0802) SpO2:  [100 %] 100 % (06/06 0802) Weight:  [66.7 kg] 66.7 kg (06/05 0949) Estimated body mass index is 29.69 kg/m as calculated from the following:   Height as of this encounter: 4\' 11"  (1.499 m).   Weight as of this encounter: 66.7 kg.   Intake/Output from previous day: 06/05 0701 - 06/06 0700 In: 1311.8 [I.V.:1211.8; IV Piggyback:100] Out: 1000 [Urine:825; Blood:175] Intake/Output this shift: No intake/output data recorded.  Physical exam the patient is alert and pleasant.  She is moving her lower extremities well.  Lab Results: Recent Labs    03/02/19 0856  WBC 8.0  HGB 12.4  HCT 39.0  PLT 214   BMET No results for input(s): NA, K, CL, CO2, GLUCOSE, BUN, CREATININE, CALCIUM in the last 72 hours.  Studies/Results: Dg Lumbar Spine 2-3 Views  Result Date: 03/04/2019 CLINICAL DATA:  Surgical posterior fusion of L4-5 and L5-S1. EXAM: DG C-ARM 61-120 MIN; LUMBAR SPINE - 2-3 VIEW FLUOROSCOPY TIME:  40 seconds. COMPARISON:  Radiographs of November 04, 2018. FINDINGS: Two intraoperative fluoroscopic images of the lower lumbar spine demonstrate the patient be status post surgical posterior fusion of L4-5 and L5-S1 with bilateral intrapedicular screw placement and interbody fusion. IMPRESSION: Fluoroscopic guidance was provided during surgical posterior fusion of L4-5 and L5-S1. Electronically Signed   By: Marijo Conception M.D.   On: 03/04/2019 13:38   Dg C-arm 1-60 Min  Result Date: 03/04/2019 CLINICAL DATA:  Surgical posterior fusion of L4-5 and L5-S1. EXAM: DG C-ARM 61-120 MIN; LUMBAR SPINE - 2-3 VIEW FLUOROSCOPY TIME:  40 seconds. COMPARISON:  Radiographs of November 04, 2018.  FINDINGS: Two intraoperative fluoroscopic images of the lower lumbar spine demonstrate the patient be status post surgical posterior fusion of L4-5 and L5-S1 with bilateral intrapedicular screw placement and interbody fusion. IMPRESSION: Fluoroscopic guidance was provided during surgical posterior fusion of L4-5 and L5-S1. Electronically Signed   By: Marijo Conception M.D.   On: 03/04/2019 13:38    Assessment/Plan: Postop day #1: The patient is doing well.  We will mobilize her with PT.  LOS: 1 day     Jodi Bowman 03/05/2019, 8:45 AM

## 2019-03-05 NOTE — Progress Notes (Signed)
BP 98/54. HR 57. Pt stating she feels dizzy. Meyran, NP aware. New orders to follow. Will continue to monitor.

## 2019-03-05 NOTE — Progress Notes (Signed)
Per Meyran, NP metoprolol not admin. Will continue to monitor.

## 2019-03-05 NOTE — Progress Notes (Signed)
PT Cancellation Note  Patient Details Name: Jodi Bowman MRN: [id number] DOB: 1961-07-03   Cancelled Treatment:    Reason Eval/Treat Not Completed: Pain limiting ability to participate;Fatigue/lethargy limiting ability to participate.  Pt was in pain in the AM, then pain with fatigue from OT visit when PT returned.  Will follow up as time allows.   Ramond Dial 03/05/2019, 1:19 PM  Mee Hives, PT MS Acute Rehab Dept. Number: [place] and [place]

## 2019-03-06 DIAGNOSIS — R338 Other retention of urine: Secondary | ICD-10-CM | POA: Diagnosis not present

## 2019-03-06 DIAGNOSIS — N9989 Other postprocedural complications and disorders of genitourinary system: Secondary | ICD-10-CM | POA: Diagnosis not present

## 2019-03-06 DIAGNOSIS — I951 Orthostatic hypotension: Secondary | ICD-10-CM | POA: Diagnosis not present

## 2019-03-06 LAB — CBC
HCT: 28 % — ABNORMAL LOW (ref 36.0–46.0)
Hemoglobin: 9.1 g/dL — ABNORMAL LOW (ref 12.0–15.0)
MCH: 29.6 pg (ref 26.0–34.0)
MCHC: 32.5 g/dL (ref 30.0–36.0)
MCV: 91.2 fL (ref 80.0–100.0)
Platelets: 157 10*3/uL (ref 150–400)
RBC: 3.07 MIL/uL — ABNORMAL LOW (ref 3.87–5.11)
RDW: 14.3 % (ref 11.5–15.5)
WBC: 12.9 10*3/uL — ABNORMAL HIGH (ref 4.0–10.5)
nRBC: 0 % (ref 0.0–0.2)

## 2019-03-06 LAB — BASIC METABOLIC PANEL
Anion gap: 8 (ref 5–15)
BUN: 9 mg/dL (ref 6–20)
CO2: 24 mmol/L (ref 22–32)
Calcium: 7.7 mg/dL — ABNORMAL LOW (ref 8.9–10.3)
Chloride: 108 mmol/L (ref 98–111)
Creatinine, Ser: 0.65 mg/dL (ref 0.44–1.00)
GFR calc Af Amer: >60 mL/min (ref >60)
GFR calc non Af Amer: >60 mL/min (ref >60)
Glucose, Bld: 99 mg/dL (ref 70–99)
Potassium: 3.8 mmol/L (ref 3.5–5.1)
Sodium: 140 mmol/L (ref 135–145)

## 2019-03-06 MED ORDER — DULOXETINE HCL 30 MG PO CPEP
30.0000 mg | ORAL_CAPSULE | Freq: Every day | ORAL | Status: DC
  Administered 2019-03-07 – 2019-03-08 (×2): 30 mg via ORAL
  Filled 2019-03-06 (×2): qty 1

## 2019-03-06 MED ORDER — SODIUM CHLORIDE 0.9 % IV SOLN
INTRAVENOUS | Status: AC
  Administered 2019-03-06: 18:00:00 via INTRAVENOUS

## 2019-03-06 MED ORDER — SODIUM CHLORIDE 0.9 % IV BOLUS
1000.0000 mL | Freq: Once | INTRAVENOUS | Status: AC
  Administered 2019-03-06: 1000 mL via INTRAVENOUS

## 2019-03-06 NOTE — Progress Notes (Signed)
Patient ID: ENZLEY KITCHENS, female   DOB: 02/23/1961, 58 y.o.   MRN: [id number] Subjective: Patient reports significant back soreness.  No real leg pain or numbness tingling or weakness.  Had a dizzy spell this morning.  Denies chest pain or headache.  Objective: Vital signs in last 24 hours: Temp:  [97.5 F (36.4 C)-98.6 F (37 C)] 97.6 F (36.4 C) (06/07 0755) Pulse Rate:  [57-79] 79 (06/07 0755) Resp:  [14-17] 14 (06/07 0412) BP: (98-119)/(54-64) 105/61 (06/07 0755) SpO2:  [99 %-100 %] 99 % (06/07 0755)  Intake/Output from previous day: 06/06 0701 - 06/07 0700 In: 758.9 [P.O.:240; IV Piggyback:518.9] Out: 1000 [Urine:1000] Intake/Output this shift: No intake/output data recorded.  Neurologic: Grossly normal to in bed exam  Lab Results: Lab Results  Component Value Date   WBC 15.9 (H) 03/05/2019   HGB 9.9 (L) 03/05/2019   HCT 30.6 (L) 03/05/2019   MCV 92.2 03/05/2019   PLT 184 03/05/2019   Lab Results  Component Value Date   INR 1.07 06/08/2015   BMET Lab Results  Component Value Date   NA 140 10/05/2018   K 3.2 (L) 10/05/2018   CL 108 10/05/2018   CO2 25 10/05/2018   GLUCOSE 102 (H) 10/05/2018   BUN 9 10/05/2018   CREATININE 0.85 10/05/2018   CALCIUM 8.4 (L) 10/05/2018    Studies/Results: Dg Lumbar Spine 2-3 Views  Result Date: 03/04/2019 CLINICAL DATA:  Surgical posterior fusion of L4-5 and L5-S1. EXAM: DG C-ARM 61-120 MIN; LUMBAR SPINE - 2-3 VIEW FLUOROSCOPY TIME:  40 seconds. COMPARISON:  Radiographs of November 04, 2018. FINDINGS: Two intraoperative fluoroscopic images of the lower lumbar spine demonstrate the patient be status post surgical posterior fusion of L4-5 and L5-S1 with bilateral intrapedicular screw placement and interbody fusion. IMPRESSION: Fluoroscopic guidance was provided during surgical posterior fusion of L4-5 and L5-S1. Electronically Signed   By: Marijo Conception M.D.   On: 03/04/2019 13:38   Dg C-arm 1-60 Min  Result Date:  03/04/2019 CLINICAL DATA:  Surgical posterior fusion of L4-5 and L5-S1. EXAM: DG C-ARM 61-120 MIN; LUMBAR SPINE - 2-3 VIEW FLUOROSCOPY TIME:  40 seconds. COMPARISON:  Radiographs of November 04, 2018. FINDINGS: Two intraoperative fluoroscopic images of the lower lumbar spine demonstrate the patient be status post surgical posterior fusion of L4-5 and L5-S1 with bilateral intrapedicular screw placement and interbody fusion. IMPRESSION: Fluoroscopic guidance was provided during surgical posterior fusion of L4-5 and L5-S1. Electronically Signed   By: Marijo Conception M.D.   On: 03/04/2019 13:38    Assessment/Plan: Possible near syncopal spell this morning.  We will check some labs.  She has no chest pain.  She looks good right now.  New pain control.  Mobilize as tolerated.  Estimated body mass index is 29.69 kg/m as calculated from the following:   Height as of this encounter: 4\' 11"  (1.499 m).   Weight as of this encounter: 66.7 kg.    LOS: 2 days    Eustace Moore 03/06/2019, 8:48 AM

## 2019-03-06 NOTE — Progress Notes (Signed)
PT Cancellation Note  Patient Details Name: Jodi Bowman MRN: [id number] DOB: 11-10-1960   Cancelled Treatment:    Reason Eval/Treat Not Completed: Medical issues which prohibited therapy. Pt with syncopal episode this AM and again with hypotension at lunch time. Will hold therapy until tomorrow.   Leighton Roach, PT  Acute Rehab Services  Pager [phone] Office [place] 03/06/2019, 2:03 PM

## 2019-03-06 NOTE — Progress Notes (Signed)
Was told in handoff pt had another syncopal episode this morning while getting up to the bedside commode. Was told that she felt dizzy and broke out in a sweat.Pt is now back in bed resting. Will continue to monitor pt.

## 2019-03-06 NOTE — Progress Notes (Signed)
MD paged regarding last set of VS. Will continue to monitor pt.

## 2019-03-06 NOTE — Consult Note (Signed)
Medical Consultation   Jodi Bowman  NTZ:[id number]  DOB: 1961/03/15  DOA: 03/04/2019  PCP: Aletha Halim., PA-C   Requesting physician: Dr. Trenton Gammon from neurosurgery  Reason for consultation: There is static hypotension   History of Present Illness: Jodi Bowman is an 58 y.o. female admitted with chronic intractable back pain failing all conservative management for level lumbar decompression and fusion in hopes of improving her symptoms.  She has a past medical history significant for arthritis, dizziness, fibromyalgia, gastroesophageal reflux disease, hypertension, low back pain, migraine, obesity, postoperative nausea and vomiting, and sleep apnea.  Reports a long history of dizzy spells but today had an episode of dizziness where her orthostatics were noted to be positive she had a lying blood pressure of 675 systolic and when she stood it dropped to 80.  She was given a liter of normal saline repeat orthostatics this afternoon revealed a blood pressure of 916 systolic that dropped to 384 systolic however her heart rate did get up to 130.  And is on multiple home medications including Cymbalta, Norco, Dean, losartan, Lyrica, Robaxin, metoprolol, oxycodone acetaminophen, and Hycodan cough syrup which all can contribute combined to give the patient orthostatic hypotension.  The most significant of these is Cymbalta.  Patient denies any episodes of syncope associated with her dizzy spells.  This afternoon when she stood up she felt less well but did not drop her blood pressure is much her heart rate did go up.  He had no associated nausea vomiting or headache or blurry vision.  She has no weakness on one side or the other.  I was asked to further evaluate the patient.   Review of Systems:  As per HPI otherwise 10 point review of systems negative.   Past Medical History: Past Medical History:  Diagnosis Date  . Arthritis   . Calculus of gallbladder without mention of  cholecystitis or obstruction   . Dizziness   . Fibromyalgia   . GERD (gastroesophageal reflux disease)   . Goiter   . Hypercholesteremia   . Hypertension   . Lower back pain   . Migraine   . Nontoxic uninodular goiter    sees dr vollmer at [facility]  . Obesity   . PONV (postoperative nausea and vomiting)   . Sleep apnea    STOPBANG=5  . Stroke [facility]) 2000    Past Surgical History: Past Surgical History:  Procedure Laterality Date  . ABDOMINAL HYSTERECTOMY    . BOWEL RESECTION N/A 03/06/2018   Procedure: SMALL BOWEL ANASTAMOSIS;  Surgeon: Clovis Riley, MD;  Location: [place];  Service: General;  Laterality: N/A;  . CATARACT EXTRACTION W/ INTRAOCULAR LENS IMPLANT Bilateral   . CESAREAN SECTION  yrs ago   done x 2  . CHOLECYSTECTOMY  01/05/2012   Procedure: LAPAROSCOPIC CHOLECYSTECTOMY WITH INTRAOPERATIVE CHOLANGIOGRAM;  Surgeon: Pedro Earls, MD;  [facility];  Service: General;  Laterality: N/A;  . COLONOSCOPY  10/08/2012   Procedure: COLONOSCOPY;  Surgeon: Beryle Beams, MD;  [facility];  Service: Endoscopy;  Laterality: N/A;  . KNEE ARTHROSCOPY  one 1995 and 1 in 1997   both knees done  . LAPAROSCOPY N/A 03/06/2018   Procedure: LAPAROSCOPY DIAGNOSTIC WITH LYSIS OF ADHESIONS;  Surgeon: Clovis Riley, MD;  Location: [place];  Service: General;  Laterality: N/A;  . LAPAROTOMY N/A 03/06/2018   Procedure: EXPLORATORY LAPAROTOMY, RESECTION OF DISTAL  ROUX, CLOSURE OF INTERNAL HERNIA;  Surgeon: Clovis Riley, MD;  Location: [place];  Service: General;  Laterality: N/A;  . POSTERIOR LUMBAR FUSION  03/04/2019  . surgery for endometriosis  yrs ago  . thryoid biopsy  December 01, 2011    [facility]     Allergies:   Allergies  Allergen Reactions  . Shrimp [Shellfish Allergy] Anaphylaxis  . Naproxen Other (See Comments)    Makes stomach cramp and burning badly.     Social History:  reports that she has never smoked. She has never used smokeless tobacco. She  reports previous alcohol use. She reports that she does not use drugs.   Family History: Family History  Problem Relation Age of Onset  . Diabetes Mother   . Hypertension Mother   . Transient ischemic attack Mother   . Seizures Mother   . Dementia Mother   . Other Father        MVA  . Cancer Brother        colon and lung  . Cancer Maternal Grandmother        colon    Physical Exam: Vitals:   03/06/19 1236 03/06/19 1254 03/06/19 1515 03/06/19 1553  BP: (!) 84/54  128/67 134/66  Pulse: (!) 105  84 84  Resp:    16  Temp:    98.5 F (36.9 C)  TempSrc:    Oral  SpO2: (!) 75% 98%  100%  Weight:      Height:        Constitutional:  Alert and awake, oriented x3, not in any acute distress.  Chronically ill-appearing Eyes: PERLA, EOMI, irises appear normal, anicteric sclera,  ENMT: external ears and nose appear normal, normal hearing            Lips appears normal, oropharynx mucosa, tongue, posterior pharynx appear normal  Neck: neck appears normal, no masses, normal ROM, no thyromegaly, no JVD  CVS: S1-S2 clear, no murmur rubs or gallops, no LE edema, normal pedal pulses  Respiratory:  clear to auscultation bilaterally, no wheezing, rales or rhonchi. Respiratory effort normal. No accessory muscle use.  Abdomen: soft nontender, nondistended, normal bowel sounds, no hepatosplenomegaly, no hernias  Musculoskeletal: : no cyanosis, clubbing or edema noted bilaterally, back wound clean and dry without any exudate Neuro: Cranial nerves II-XII intact, strength, sensation, reflexes Psych: judgement and insight appear normal, stable mood and affect, mental status Skin: no rashes or lesions or ulcers, no induration or nodules    Data reviewed:  I have personally reviewed following labs and imaging studies Labs:  CBC: Recent Labs  Lab 03/02/19 0856 03/05/19 1424 03/06/19 1112  WBC 8.0 15.9* 12.9*  NEUTROABS 3.2  --   --   HGB 12.4 9.9* 9.1*  HCT 39.0 30.6* 28.0*  MCV 93.3 92.2  91.2  PLT 214 184 409    Basic Metabolic Panel: Recent Labs  Lab 03/06/19 1112  NA 140  K 3.8  CL 108  CO2 24  GLUCOSE 99  BUN 9  CREATININE 0.65  CALCIUM 7.7*   GFR Estimated Creatinine Clearance: 62.9 mL/min (by C-G formula based on SCr of 0.65 mg/dL). CBG: Recent Labs  Lab 03/04/19 2314  GLUCAP 151*   Urinalysis    Component Value Date/Time   COLORURINE YELLOW 03/06/2018 1251   APPEARANCEUR HAZY (A) 03/06/2018 1251   LABSPEC 1.018 03/06/2018 1251   PHURINE 6.0 03/06/2018 1251   GLUCOSEU NEGATIVE 03/06/2018 1251   HGBUR NEGATIVE 03/06/2018 1251  HGBUR negative 03/01/2009 0853   BILIRUBINUR NEGATIVE 03/06/2018 1251   KETONESUR NEGATIVE 03/06/2018 1251   PROTEINUR NEGATIVE 03/06/2018 1251   UROBILINOGEN 1.0 10/09/2014 2018   NITRITE NEGATIVE 03/06/2018 1251   LEUKOCYTESUR NEGATIVE 03/06/2018 1251     Microbiology Recent Results (from the past 240 hour(s))  Surgical pcr screen     Status: Abnormal   Collection Time: 03/02/19  8:56 AM  Result Value Ref Range Status   MRSA, PCR NEGATIVE NEGATIVE Final   Staphylococcus aureus POSITIVE (A) NEGATIVE Final    Comment: (NOTE) The Xpert SA Assay (FDA approved for NASAL specimens in patients 64 years of age and older), is one component of a comprehensive surveillance program. It is not intended to diagnose infection nor to guide or monitor treatment. Performed at [facility], [place] [address], [place], [place] [address]   Novel Coronavirus, NAA (hospital order; send-out to ref lab)     Status: None   Collection Time: 03/02/19  9:22 AM  Result Value Ref Range Status   SARS-CoV-2, NAA NOT DETECTED NOT DETECTED Final    Comment: (NOTE) This test was developed and its performance characteristics determined by [employer]. This test has not been FDA cleared or approved. This test has been authorized by FDA under an Emergency Use Authorization (EUA). This test is only authorized for the duration of  time the declaration that circumstances exist justifying the authorization of the emergency use of in vitro diagnostic tests for detection of SARS-CoV-2 virus and/or diagnosis of COVID-19 infection under section 564(b)(1) of the Act, 21 U.S.C. 702OVZ-8(H)(8), unless the authorization is terminated or revoked sooner. When diagnostic testing is negative, the possibility of a false negative result should be considered in the context of a patient's recent exposures and the presence of clinical signs and symptoms consistent with COVID-19. An individual without symptoms of COVID-19 and who is not shedding SARS-CoV-2 virus would expect to have a negative (not detected) result in this assay. Performed  At: [facility] [address] [place], [place] [id number] Rush Farmer MD IN:[id number]    [place]  Final    Comment: Performed at [facility], [place] [address], [place], [place] [address]       Inpatient Medications:   Scheduled Meds: . aMILoride  5 mg Oral Daily  . amLODipine  5 mg Oral Daily  . aspirin EC  81 mg Oral Daily  . [START ON 03/07/2019] DULoxetine  30 mg Oral Daily  . folic acid  1 mg Oral Daily  . loratadine  10 mg Oral Daily  . losartan  100 mg Oral Daily  . metoprolol succinate  50 mg Oral Daily  . pantoprazole  40 mg Oral BID  . potassium chloride SA  20 mEq Oral BID  . pregabalin  25 mg Oral TID  . sodium chloride flush  3 mL Intravenous Q12H  . [START ON 03/07/2019] Vitamin D (Ergocalciferol)  50,000 Units Oral Q M,W,F   Continuous Infusions: . sodium chloride    . sodium chloride 250 mL/hr at 03/06/19 1741     Radiological Exams on Admission: No results found.  Impression/Recommendations Active Problems:   Orthostatic hypotension   Postoperative urinary retention   Lumbar foraminal stenosis   1.  Orthostatic hypotension: Patient is on multiple medications which combined and even alone can cause her to have  orthostatic hypotension.  Also mentions that she has been having difficulty with urinating.  Urinary retention can cause hypotension  as well.  We will give the patient another liter of normal saline over the next 4 hours.  Would recommend rechecking orthostatics in a.m.  Going to decrease her duloxetine dose to 30 mg daily.  I would like to stop it however stopping it would cause withdrawal symptoms.  2.  Postoperative urinary retention: She has had to have in and out caths done.  To start her on a bladder training schedule.  She will toilet every 6 hours we will check a bladder scan after toileting and if there is more than 250 mL of urine in the bladder will in and out cath her.  This should help with some retraining.  3.  Lumbar foraminal stenosis status post fusion 2 days ago.  Management per neurosurgery.  Thank you for this consultation.  Our [facility] hospitalist team will follow the patient with you.   Time Spent: 50 minutes  Lady Deutscher M.D. [facility] 03/06/2019, 6:55 PM

## 2019-03-07 ENCOUNTER — Encounter (HOSPITAL_COMMUNITY): Payer: Self-pay | Admitting: *Deleted

## 2019-03-07 DIAGNOSIS — I951 Orthostatic hypotension: Secondary | ICD-10-CM

## 2019-03-07 DIAGNOSIS — M48061 Spinal stenosis, lumbar region without neurogenic claudication: Secondary | ICD-10-CM

## 2019-03-07 DIAGNOSIS — R338 Other retention of urine: Secondary | ICD-10-CM

## 2019-03-07 DIAGNOSIS — N9989 Other postprocedural complications and disorders of genitourinary system: Secondary | ICD-10-CM

## 2019-03-07 MED ORDER — SODIUM CHLORIDE 0.9 % IV SOLN
INTRAVENOUS | Status: AC
  Administered 2019-03-07: 10:00:00 via INTRAVENOUS

## 2019-03-07 MED ORDER — SODIUM CHLORIDE 0.9 % IV BOLUS
500.0000 mL | Freq: Once | INTRAVENOUS | Status: AC
  Administered 2019-03-07: 500 mL via INTRAVENOUS

## 2019-03-07 NOTE — Progress Notes (Signed)
Postop day 3.  Patient's pain reasonably well controlled.  Still with some orthostatic hypotension.  Afebrile.  Vital signs are stable.  Awake and alert.  Oriented and appropriate.  Motor and sensory function intact.  Wound clean and dry.  Progressing slowly following two-level lumbar decompression and fusion.  Continue current observation.  Probable discharge home tomorrow.

## 2019-03-07 NOTE — TOC Transition Note (Addendum)
Inpatient Rehabilitation Admissions Coordinator  Therapy has changed their recommendations from [facility] to CIR. Please place an order for an inpt rehab consult if pt would like to be considered for admit.  Danne Baxter, RN, MSN Rehab Admissions Coordinator [phone] 03/07/2019 7:14 PM

## 2019-03-07 NOTE — Progress Notes (Signed)
Progress Note   Jodi Bowman  ONG:[id number]  DOB: December 05, 1960  DOA: 03/04/2019  PCP: Aletha Halim., PA-C   Requesting physician: Dr. Trenton Gammon from neurosurgery  Reason for consultation: There is static hypotension   History of Present Illness: Jodi Bowman is an 58 y.o. female admitted with chronic intractable back pain failing all conservative management for level lumbar decompression and fusion in hopes of improving her symptoms.  She has a past medical history significant for arthritis, dizziness, fibromyalgia, gastroesophageal reflux disease, hypertension, low back pain, migraine, obesity, postoperative nausea and vomiting, and sleep apnea.  Reports a long history of dizzy spells but today had an episode of dizziness where her orthostatics were noted to be positive she had a lying blood pressure of 440 systolic and when she stood it dropped to 80.  She was given a liter of normal saline repeat orthostatics this afternoon revealed a blood pressure of 102 systolic that dropped to 725 systolic however her heart rate did get up to 130.  And is on multiple home medications including Cymbalta, Norco, Dean, losartan, Lyrica, Robaxin, metoprolol, oxycodone acetaminophen, and Hycodan cough syrup which all can contribute combined to give the patient orthostatic hypotension.  The most significant of these is Cymbalta.  Patient denies any episodes of syncope associated with her dizzy spells.  This afternoon when she stood up she felt less well but did not drop her blood pressure is much her heart rate did go up.  She had no associated nausea vomiting or headache or blurry vision.  She has no weakness on one side or the other.  I was asked to further evaluate the patient.  Review of Systems:  As per HPI otherwise 10 point review of systems negative.   Past Medical History: Past Medical History:  Diagnosis Date  . Arthritis   . Calculus of gallbladder without mention of  cholecystitis or obstruction   . Dizziness   . Fibromyalgia   . GERD (gastroesophageal reflux disease)   . Goiter   . Hypercholesteremia   . Hypertension   . Lower back pain   . Migraine   . Nontoxic uninodular goiter    sees dr vollmer at [facility]  . Obesity   . PONV (postoperative nausea and vomiting)   . Sleep apnea    STOPBANG=5  . Stroke [facility]) 2000    Past Surgical History: Past Surgical History:  Procedure Laterality Date  . ABDOMINAL HYSTERECTOMY    . BOWEL RESECTION N/A 03/06/2018   Procedure: SMALL BOWEL ANASTAMOSIS;  Surgeon: Clovis Riley, MD;  Location: [place];  Service: General;  Laterality: N/A;  . CATARACT EXTRACTION W/ INTRAOCULAR LENS IMPLANT Bilateral   . CESAREAN SECTION  yrs ago   done x 2  . CHOLECYSTECTOMY  01/05/2012   Procedure: LAPAROSCOPIC CHOLECYSTECTOMY WITH INTRAOPERATIVE CHOLANGIOGRAM;  Surgeon: Pedro Earls, MD;  [facility];  Service: General;  Laterality: N/A;  . COLONOSCOPY  10/08/2012   Procedure: COLONOSCOPY;  Surgeon: Beryle Beams, MD;  [facility];  Service: Endoscopy;  Laterality: N/A;  . KNEE ARTHROSCOPY  one 1995 and 1 in 1997   both knees done  . LAPAROSCOPY N/A 03/06/2018   Procedure: LAPAROSCOPY DIAGNOSTIC WITH LYSIS OF ADHESIONS;  Surgeon: Clovis Riley, MD;  Location: [place];  Service: General;  Laterality: N/A;  . LAPAROTOMY N/A 03/06/2018   Procedure: EXPLORATORY LAPAROTOMY, RESECTION OF DISTAL ROUX,  CLOSURE OF INTERNAL HERNIA;  Surgeon: Clovis Riley, MD;  Location: [place];  Service: General;  Laterality: N/A;  . POSTERIOR LUMBAR FUSION  03/04/2019  . surgery for endometriosis  yrs ago  . thryoid biopsy  December 01, 2011    [facility]     Allergies:   Allergies  Allergen Reactions  . Shrimp [Shellfish Allergy] Anaphylaxis  . Naproxen Other (See Comments)    Makes stomach cramp and burning badly.     Social History:  reports that she has never smoked. She has never used smokeless tobacco. She  reports previous alcohol use. She reports that she does not use drugs.   Family History: Family History  Problem Relation Age of Onset  . Diabetes Mother   . Hypertension Mother   . Transient ischemic attack Mother   . Seizures Mother   . Dementia Mother   . Other Father        MVA  . Cancer Brother        colon and lung  . Cancer Maternal Grandmother        colon    Physical Exam: Vitals:   03/07/19 0811 03/07/19 0814 03/07/19 0822 03/07/19 0824  BP: 134/67 100/66 119/73   Pulse: (!) 121 (!) 123 (!) 131 97  Resp:      Temp:      TempSrc:      SpO2: (!) 73%  100%   Weight:      Height:        General:  Pleasantly resting in bed, No acute distress. HEENT:  Normocephalic atraumatic.  Sclerae nonicteric, noninjected.  Extraocular movements intact bilaterally. Neck:  Without mass or deformity.  Trachea is midline. Lungs:  Clear to auscultate bilaterally without rhonchi, wheeze, or rales. Heart:  Regular rate and rhythm.  Without murmurs, rubs, or gallops. Abdomen:  Soft, nontender, nondistended.  Without guarding or rebound. Extremities: Without cyanosis, clubbing, edema, or obvious deformity. Vascular:  Dorsalis pedis and posterior tibial pulses palpable bilaterally. Skin:  Warm and dry, no erythema, no ulcerations.  Data reviewed:  I have personally reviewed following labs and imaging studies Labs:  CBC: Recent Labs  Lab 03/02/19 0856 03/05/19 1424 03/06/19 1112  WBC 8.0 15.9* 12.9*  NEUTROABS 3.2  --   --   HGB 12.4 9.9* 9.1*  HCT 39.0 30.6* 28.0*  MCV 93.3 92.2 91.2  PLT 214 184 786    Basic Metabolic Panel: Recent Labs  Lab 03/06/19 1112  NA 140  K 3.8  CL 108  CO2 24  GLUCOSE 99  BUN 9  CREATININE 0.65  CALCIUM 7.7*   GFR Estimated Creatinine Clearance: 62.9 mL/min (by C-G formula based on SCr of 0.65 mg/dL). CBG: Recent Labs  Lab 03/04/19 2314  GLUCAP 151*   Urinalysis    Component Value Date/Time   COLORURINE YELLOW 03/06/2018 1251    APPEARANCEUR HAZY (A) 03/06/2018 1251   LABSPEC 1.018 03/06/2018 1251   PHURINE 6.0 03/06/2018 1251   GLUCOSEU NEGATIVE 03/06/2018 1251   HGBUR NEGATIVE 03/06/2018 1251   HGBUR negative 03/01/2009 0853   BILIRUBINUR NEGATIVE 03/06/2018 1251   KETONESUR NEGATIVE 03/06/2018 1251   PROTEINUR NEGATIVE 03/06/2018 1251   UROBILINOGEN 1.0 10/09/2014 2018   NITRITE NEGATIVE 03/06/2018 1251   LEUKOCYTESUR NEGATIVE 03/06/2018 1251     Microbiology Recent Results (from the past 240 hour(s))  Surgical pcr screen     Status: Abnormal   Collection Time: 03/02/19  8:56 AM  Result Value Ref  Range Status   MRSA, PCR NEGATIVE NEGATIVE Final   Staphylococcus aureus POSITIVE (A) NEGATIVE Final    Comment: (NOTE) The Xpert SA Assay (FDA approved for NASAL specimens in patients 84 years of age and older), is one component of a comprehensive surveillance program. It is not intended to diagnose infection nor to guide or monitor treatment. Performed at [facility], [place] [address] [place], [place] [address]   Novel Coronavirus, NAA (hospital order; send-out to ref lab)     Status: None   Collection Time: 03/02/19  9:22 AM  Result Value Ref Range Status   SARS-CoV-2, NAA NOT DETECTED NOT DETECTED Final    Comment: (NOTE) This test was developed and its performance characteristics determined by [employer]. This test has not been FDA cleared or approved. This test has been authorized by FDA under an Emergency Use Authorization (EUA). This test is only authorized for the duration of time the declaration that circumstances exist justifying the authorization of the emergency use of in vitro diagnostic tests for detection of SARS-CoV-2 virus and/or diagnosis of COVID-19 infection under section 564(b)(1) of the Act, 21 U.S.C. 240XBD-5(H)(2), unless the authorization is terminated or revoked sooner. When diagnostic testing is negative, the possibility of a false negative result should  be considered in the context of a patient's recent exposures and the presence of clinical signs and symptoms consistent with COVID-19. An individual without symptoms of COVID-19 and who is not shedding SARS-CoV-2 virus would expect to have a negative (not detected) result in this assay. Performed  At: [facility] [address] [place], [place] [id number] Rush Farmer MD HD:[id number]    [place]  Final    Comment: Performed at [facility], [place] [address] [place], [place] [address]       Inpatient Medications:   Scheduled Meds: . aMILoride  5 mg Oral Daily  . amLODipine  5 mg Oral Daily  . aspirin EC  81 mg Oral Daily  . DULoxetine  30 mg Oral Daily  . folic acid  1 mg Oral Daily  . loratadine  10 mg Oral Daily  . losartan  100 mg Oral Daily  . metoprolol succinate  50 mg Oral Daily  . pantoprazole  40 mg Oral BID  . potassium chloride SA  20 mEq Oral BID  . pregabalin  25 mg Oral TID  . sodium chloride flush  3 mL Intravenous Q12H  . Vitamin D (Ergocalciferol)  50,000 Units Oral Q M,W,F   Continuous Infusions: . sodium chloride 250 mL (03/06/19 2106)     Radiological Exams on Admission: No results found.  Impression/Recommendations Active Problems:   Lumbar foraminal stenosis   Orthostatic hypotension   Postoperative urinary retention   Orthostatic hypotension, transient likely multifactorial in etiology: Dehydration w/ poor p.o. intake perioperatively, improving Pharmacy with multiple narcotic medications, ongoing Postop urinary retention, improving Polypharmacy ongoing concern is primary etiology New IV fluids, urine output somewhat poor over the past 48 hours likely in setting of dehydration New IV fluids until urine output increases appropriately Previously decreased dosing of duloxetine dose to 30 mg daily Follow temps closely with narcotic medication administration  Postoperative urinary retention:  Continue  scheduled toilet training with attempted urination every 6 hours we will check a bladder scan after toileting and if there is more than 250 mL of urine in the bladder will in and out cath her.    Leukocytosis, likely reactive perioperatively as above  Downtrending  without intervention, follow along clinically no current complaints of fevers, chills, urinary symptoms, shortness of breath, cough nausea, vomiting, diarrhea, constipation  Lumbar foraminal stenosis status post fusion with neurosurgery.  -Prior to primary, pain well controlled, PT OT ongoing; may benefit from placement per discussion with PT today  Obesity, morbid Continue improving diet and exercise regimen as discussed at bedside  Thank you for this consultation.  Our [facility] hospitalist team will follow the patient with you.  Time Spent: 35 minutes  Little Ishikawa M.D. [facility] 03/07/2019, 2:41 PM

## 2019-03-07 NOTE — Care Management Important Message (Signed)
Important Message  Patient Details  Name: Jodi Bowman MRN: [id number] Date of Birth: 02/04/1961   Medicare Important Message Given:  Yes    Memory Argue 03/07/2019, 4:29 PM

## 2019-03-07 NOTE — Progress Notes (Signed)
Physical Therapy Treatment Patient Details Name: Jodi Bowman MRN: [id number] DOB: Feb 06, 1961 Today's Date: 03/07/2019    History of Present Illness Pt is a 58 y/o female s/p L4-S1 PLIF. PMH includes fibromyalgia, HTN, migraines, sleep apnea, and CVA.     PT Comments    Pt continues to be severly limited due to dizziness obtained orthostatic vitals listed below.  Based on her slow progression will inform supervising PT of need for change in recommendations to CIR for aggressive rehab to allow her to return home at a modified independent level.  She is currently requiring min assistance for bed mobility, transfers, and gt training.  Pt has support from her husband in the evenings but he is not around during the day.  With admission to CIR she should be able to return home independently with DME.    BP supine - 112/54 BP seated - 117/75 BP standing - 100/63 BP after 3 min - 108/71 BP after gt training - 114/64  Pt reports dizziness through all mobility.      Follow Up Recommendations  CIR;Supervision for mobility/OOB;Supervision/Assistance - 24 hour     Equipment Recommendations  Rolling walker with 5" wheels    Recommendations for Other Services Rehab consult     Precautions / Restrictions Precautions Precautions: Back;Fall Precaution Booklet Issued: No Precaution Comments: Verbally reviewed back precautions.   Pt unable to recall and appeared as if this was the first time she was hearing this information Required Braces or Orthoses: Spinal Brace(educated on application in sitting.  Pt able to teach back method) Spinal Brace: Lumbar corset;Applied in sitting position Restrictions Weight Bearing Restrictions: No    Mobility  Bed Mobility Overal bed mobility: Needs Assistance Bed Mobility: Rolling;Sidelying to Sit Rolling: Min guard Sidelying to sit: Min assist       General bed mobility comments: Cues for spinal precautions with min assistance to advance LEs and  elevate trunk into sitting edge of bed.    Transfers Overall transfer level: Needs assistance Equipment used: Rolling walker (2 wheeled) Transfers: Sit to/from Stand Sit to Stand: Min assist         General transfer comment: Pt attempted to stand unassisted but unable to clear her hips to achieve standing.  Require min assistance to boost into standing.    Ambulation/Gait Ambulation/Gait assistance: Min assist Gait Distance (Feet): 12 Feet Assistive device: Rolling walker (2 wheeled) Gait Pattern/deviations: Step-through pattern;Trunk flexed;Antalgic Gait velocity: very slow cadence   General Gait Details: Slow steps with cues for scapular retraction and upper trunk control.  Pt required cues for sequencing and RW safety.     Stairs             Wheelchair Mobility    Modified Rankin (Stroke Patients Only)       Balance     Sitting balance-Leahy Scale: Fair       Standing balance-Leahy Scale: Poor                              Cognition Arousal/Alertness: Awake/alert Behavior During Therapy: WFL for tasks assessed/performed Overall Cognitive Status: Within Functional Limits for tasks assessed                                 General Comments: pt is very specific about feeling light headed with standing      Exercises  General Comments        Pertinent Vitals/Pain Pain Assessment: 0-10 Pain Score: 4  Pain Location: low back Pain Descriptors / Indicators: Operative site guarding Pain Intervention(s): Monitored during session;Repositioned    Home Living                      Prior Function            PT Goals (current goals can now be found in the care plan section) Acute Rehab PT Goals Patient Stated Goal: to get stronger Potential to Achieve Goals: Good Progress towards PT goals: Progressing toward goals(slowly due to continued dizziness)    Frequency    Min 5X/week      PT Plan Discharge  plan needs to be updated    Co-evaluation              AM-PAC PT "6 Clicks" Mobility   Outcome Measure  Help needed turning from your back to your side while in a flat bed without using bedrails?: A Little Help needed moving from lying on your back to sitting on the side of a flat bed without using bedrails?: A Little Help needed moving to and from a bed to a chair (including a wheelchair)?: A Little Help needed standing up from a chair using your arms (e.g., wheelchair or bedside chair)?: A Little Help needed to walk in hospital room?: A Little Help needed climbing 3-5 steps with a railing? : A Lot 6 Click Score: 17    End of Session Equipment Utilized During Treatment: Gait belt;Back brace Activity Tolerance: Treatment limited secondary to medical complications (Comment)(continues to report dizziness with mobility) Patient left: in chair;with call bell/phone within reach Nurse Communication: Mobility status PT Visit Diagnosis: Other abnormalities of gait and mobility (R26.89);Difficulty in walking, not elsewhere classified (R26.2);Muscle weakness (generalized) (M62.81);Pain Pain - Right/Left: (central) Pain - part of body: (back)     Time: 9417-4081 PT Time Calculation (min) (ACUTE ONLY): 25 min  Charges:  $Gait Training: 8-22 mins $Therapeutic Activity: 8-22 mins                     Governor Rooks, PTA Acute Rehabilitation Services Pager [phone] Office [phone]     Christelle Igoe Eli Hose 03/07/2019, 12:05 PM

## 2019-03-07 NOTE — TOC Initial Note (Signed)
Transition of Care [facility]) - Initial/Assessment Note    Patient Details  Name: Jodi Bowman MRN: [id number] Date of Birth: [birth year]  [facility]) CM/SW Contact:    Ninfa Meeker, RN Phone Number: 03/07/2019, 10:34 AM  Clinical Narrative:    58 yr old female s/p L4-5,5-6 Lumbar fusion. Case manager spoke with patient via telephone to discuss home health and DME needs at discharge.PAtient says she lives with her husband and he is available after work.   Choice for [facility] was offered, Referral called to Neoma Laming, [facility]).               Expected Discharge Plan: [place] Barriers to Discharge: No Barriers Identified   Patient Goals and CMS Choice Patient states their goals for this hospitalization and ongoing recovery are:: get better CMS Medicare.gov Compare Post Acute Care list provided to:: (discussed via phone due to [place] 19 CM working remotely) Choice offered to / list presented to : Patient  Expected Discharge Plan and Services Expected Discharge Plan: [place]   Discharge Planning Services: CM Consult Post Acute Care Choice: Durable Medical Equipment, Home Health Living arrangements for the past 2 months: Single Family Home                 DME Arranged: Environmental consultant youth DME Agency: [employer] Date DME Agency Contacted: 03/07/19 Time DME Agency Contacted: [phone] Representative spoke with at DME Agency: [place]: PT [place]: [place] ([place]) Date [place]: 03/07/19 Time [place]: 1027 Representative spoke with at [place]: Neoma Laming  Prior Living Arrangements/Services Living arrangements for the past 2 months: [place] with:: Spouse Patient language and need for interpreter reviewed:: No Do you feel safe going back to the place where you live?: Yes      Need for Family Participation in Patient Care: Yes  (Comment) Care giver support system in place?: Yes (comment)   Criminal Activity/Legal Involvement Pertinent to Current Situation/Hospitalization: No - Comment as needed  Activities of Daily Living Home Assistive Devices/Equipment: Cane (specify quad or straight) ADL Screening (condition at time of admission) Patient's cognitive ability adequate to safely complete daily activities?: Yes Is the patient deaf or have difficulty hearing?: No Does the patient have difficulty seeing, even when wearing glasses/contacts?: No Does the patient have difficulty concentrating, remembering, or making decisions?: No Patient able to express need for assistance with ADLs?: Yes Does the patient have difficulty dressing or bathing?: No Independently performs ADLs?: Yes (appropriate for developmental age) Does the patient have difficulty walking or climbing stairs?: Yes Weakness of Legs: Both Weakness of Arms/Hands: None  Permission Sought/Granted Permission sought to share information with : Case Manager                Emotional Assessment       Orientation: : Oriented to Self, Oriented to Situation, Oriented to Place, Oriented to  Time Alcohol / Substance Use: Not Applicable    Admission diagnosis:  Degenerative disk disease - stenosis Patient Active Problem List   Diagnosis Date Noted  . Orthostatic hypotension 03/06/2019  . Postoperative urinary retention 03/06/2019  . Lumbar foraminal stenosis 03/04/2019  . S/P exploratory laparotomy 03/06/2018  . Bowel obstruction ([place]) 03/06/2018  . Chest pain, rule out acute myocardial infarction 12/17/2017  . Hypokalemia 12/17/2017  . Acute lower UTI 12/17/2017  . Vertigo 04/30/2017  . Small vessel disease,  cerebrovascular 04/30/2017  . Chronic low back pain 08/01/2015  . Right hip pain 08/01/2015  . Abnormality of gait 08/01/2015  . Left leg weakness   . Low back pain with radiation   . Syncope 07/30/2014  . Atypical chest pain 03/12/2014   . Lap chole [facility] April 2013 01/29/2012  . Gallstones 12/04/2011  . Thyroid nodule-non neoplastic goiter by needle aspiration 12/04/2011  . GLUCOSE INTOLERANCE 03/12/2010  . DYSLIPIDEMIA 03/12/2010  . Chronic migraine 03/12/2010  . Carotid stenosis 03/12/2010  . CEREBROVASCULAR ACCIDENT 03/12/2010  . LIPOMA 01/29/2010  . HEADACHE 01/29/2010  . ANKLE INJURY, RIGHT 04/12/2009  . PHARYNGITIS 03/21/2009  . Backache 03/01/2009  . ALLERGIC RHINITIS 03/17/2007  . LOW BACK PAIN 03/17/2007  . Essential hypertension 01/01/2007  . ANXIETY STATE NOS 09/11/2005   PCP:  Aletha Halim., PA-C Pharmacy:   [id number] - [place], [place] [address] [place] [address] Phone: [phone] Fax: [phone]     Social Determinants of Health (SDOH) Interventions    Readmission Risk Interventions No flowsheet data found.

## 2019-03-07 NOTE — Plan of Care (Signed)

## 2019-03-08 ENCOUNTER — Other Ambulatory Visit: Payer: Self-pay

## 2019-03-08 ENCOUNTER — Encounter (HOSPITAL_COMMUNITY): Payer: Self-pay

## 2019-03-08 ENCOUNTER — Inpatient Hospital Stay (HOSPITAL_COMMUNITY)
Admission: RE | Admit: 2019-03-08 | Discharge: 2019-03-16 | DRG: 560 | Disposition: A | Discharge status: Home Health | Payer: Medicare Other | Source: Intra-hospital | Attending: Physical Medicine & Rehabilitation | Admitting: Physical Medicine & Rehabilitation

## 2019-03-08 DIAGNOSIS — Z800 Family history of malignant neoplasm of digestive organs: Secondary | ICD-10-CM | POA: Diagnosis not present

## 2019-03-08 DIAGNOSIS — Z981 Arthrodesis status: Secondary | ICD-10-CM | POA: Diagnosis not present

## 2019-03-08 DIAGNOSIS — I10 Essential (primary) hypertension: Secondary | ICD-10-CM | POA: Diagnosis present

## 2019-03-08 DIAGNOSIS — Z886 Allergy status to analgesic agent status: Secondary | ICD-10-CM

## 2019-03-08 DIAGNOSIS — Z91013 Allergy to seafood: Secondary | ICD-10-CM | POA: Diagnosis not present

## 2019-03-08 DIAGNOSIS — H6692 Otitis media, unspecified, left ear: Secondary | ICD-10-CM | POA: Diagnosis not present

## 2019-03-08 DIAGNOSIS — Z4789 Encounter for other orthopedic aftercare: Principal | ICD-10-CM

## 2019-03-08 DIAGNOSIS — Z7951 Long term (current) use of inhaled steroids: Secondary | ICD-10-CM | POA: Diagnosis not present

## 2019-03-08 DIAGNOSIS — K219 Gastro-esophageal reflux disease without esophagitis: Secondary | ICD-10-CM | POA: Diagnosis present

## 2019-03-08 DIAGNOSIS — G894 Chronic pain syndrome: Secondary | ICD-10-CM | POA: Diagnosis not present

## 2019-03-08 DIAGNOSIS — R339 Retention of urine, unspecified: Secondary | ICD-10-CM | POA: Diagnosis present

## 2019-03-08 DIAGNOSIS — K5900 Constipation, unspecified: Secondary | ICD-10-CM | POA: Diagnosis present

## 2019-03-08 DIAGNOSIS — M5416 Radiculopathy, lumbar region: Secondary | ICD-10-CM | POA: Diagnosis present

## 2019-03-08 DIAGNOSIS — Z8249 Family history of ischemic heart disease and other diseases of the circulatory system: Secondary | ICD-10-CM

## 2019-03-08 DIAGNOSIS — G4730 Sleep apnea, unspecified: Secondary | ICD-10-CM | POA: Diagnosis present

## 2019-03-08 DIAGNOSIS — I951 Orthostatic hypotension: Secondary | ICD-10-CM

## 2019-03-08 DIAGNOSIS — H60392 Other infective otitis externa, left ear: Secondary | ICD-10-CM

## 2019-03-08 DIAGNOSIS — Z833 Family history of diabetes mellitus: Secondary | ICD-10-CM | POA: Diagnosis not present

## 2019-03-08 DIAGNOSIS — M48062 Spinal stenosis, lumbar region with neurogenic claudication: Secondary | ICD-10-CM | POA: Diagnosis not present

## 2019-03-08 DIAGNOSIS — E785 Hyperlipidemia, unspecified: Secondary | ICD-10-CM | POA: Diagnosis present

## 2019-03-08 DIAGNOSIS — E7800 Pure hypercholesterolemia, unspecified: Secondary | ICD-10-CM | POA: Diagnosis present

## 2019-03-08 DIAGNOSIS — Z801 Family history of malignant neoplasm of trachea, bronchus and lung: Secondary | ICD-10-CM

## 2019-03-08 DIAGNOSIS — M797 Fibromyalgia: Secondary | ICD-10-CM

## 2019-03-08 DIAGNOSIS — D62 Acute posthemorrhagic anemia: Secondary | ICD-10-CM

## 2019-03-08 DIAGNOSIS — N179 Acute kidney failure, unspecified: Secondary | ICD-10-CM | POA: Diagnosis present

## 2019-03-08 DIAGNOSIS — Z7982 Long term (current) use of aspirin: Secondary | ICD-10-CM | POA: Diagnosis not present

## 2019-03-08 DIAGNOSIS — Z8673 Personal history of transient ischemic attack (TIA), and cerebral infarction without residual deficits: Secondary | ICD-10-CM | POA: Diagnosis not present

## 2019-03-08 DIAGNOSIS — M1990 Unspecified osteoarthritis, unspecified site: Secondary | ICD-10-CM | POA: Diagnosis present

## 2019-03-08 DIAGNOSIS — M7989 Other specified soft tissue disorders: Secondary | ICD-10-CM | POA: Diagnosis not present

## 2019-03-08 MED ORDER — METOPROLOL SUCCINATE ER 50 MG PO TB24
50.0000 mg | ORAL_TABLET | Freq: Every day | ORAL | Status: DC
  Administered 2019-03-09 – 2019-03-16 (×7): 50 mg via ORAL
  Filled 2019-03-08 (×8): qty 1

## 2019-03-08 MED ORDER — PREGABALIN 25 MG PO CAPS
25.0000 mg | ORAL_CAPSULE | Freq: Three times a day (TID) | ORAL | Status: DC
  Administered 2019-03-08 – 2019-03-09 (×3): 25 mg via ORAL
  Filled 2019-03-08 (×2): qty 1

## 2019-03-08 MED ORDER — SORBITOL 70 % SOLN
30.0000 mL | Freq: Every day | Status: DC | PRN
  Administered 2019-03-10: 30 mL via ORAL
  Filled 2019-03-08: qty 30

## 2019-03-08 MED ORDER — HYDROCODONE-ACETAMINOPHEN 10-325 MG PO TABS: 1.0000 | ORAL_TABLET | ORAL | 0 refills | Status: DC | PRN

## 2019-03-08 MED ORDER — ACETAMINOPHEN 650 MG RE SUPP: 650.0000 mg | RECTAL | Status: DC | PRN

## 2019-03-08 MED ORDER — HYDROCODONE-ACETAMINOPHEN 10-325 MG PO TABS
1.0000 | ORAL_TABLET | ORAL | Status: DC | PRN
  Administered 2019-03-08 – 2019-03-16 (×33): 1 via ORAL
  Filled 2019-03-08 (×33): qty 1

## 2019-03-08 MED ORDER — AMILORIDE HCL 5 MG PO TABS
5.0000 mg | ORAL_TABLET | Freq: Every day | ORAL | Status: DC
  Administered 2019-03-09 – 2019-03-16 (×8): 5 mg via ORAL
  Filled 2019-03-08 (×8): qty 1

## 2019-03-08 MED ORDER — AMLODIPINE BESYLATE 5 MG PO TABS
5.0000 mg | ORAL_TABLET | Freq: Every day | ORAL | Status: DC
  Administered 2019-03-09 – 2019-03-16 (×7): 5 mg via ORAL
  Filled 2019-03-08 (×8): qty 1

## 2019-03-08 MED ORDER — ASPIRIN EC 81 MG PO TBEC
81.0000 mg | DELAYED_RELEASE_TABLET | Freq: Every day | ORAL | Status: DC
  Administered 2019-03-09 – 2019-03-16 (×8): 81 mg via ORAL
  Filled 2019-03-08 (×8): qty 1

## 2019-03-08 MED ORDER — ONDANSETRON HCL 4 MG PO TABS: 4.0000 mg | ORAL_TABLET | Freq: Four times a day (QID) | ORAL | Status: DC | PRN

## 2019-03-08 MED ORDER — DULOXETINE HCL 30 MG PO CPEP
30.0000 mg | ORAL_CAPSULE | Freq: Every day | ORAL | Status: DC
  Administered 2019-03-09 – 2019-03-16 (×8): 30 mg via ORAL
  Filled 2019-03-08 (×8): qty 1

## 2019-03-08 MED ORDER — OXYCODONE HCL 10 MG PO TABS: 10.0000 mg | ORAL_TABLET | ORAL | 0 refills | Status: DC | PRN

## 2019-03-08 MED ORDER — FOLIC ACID 1 MG PO TABS
1.0000 mg | ORAL_TABLET | Freq: Every day | ORAL | Status: DC
  Administered 2019-03-09 – 2019-03-16 (×8): 1 mg via ORAL
  Filled 2019-03-08 (×8): qty 1

## 2019-03-08 MED ORDER — PANTOPRAZOLE SODIUM 40 MG PO TBEC
40.0000 mg | DELAYED_RELEASE_TABLET | Freq: Two times a day (BID) | ORAL | Status: DC
  Administered 2019-03-08 – 2019-03-16 (×16): 40 mg via ORAL
  Filled 2019-03-08 (×16): qty 1

## 2019-03-08 MED ORDER — BISACODYL 10 MG RE SUPP: 10.0000 mg | Freq: Every day | RECTAL | 0 refills | Status: DC | PRN

## 2019-03-08 MED ORDER — ACETAMINOPHEN 325 MG PO TABS: 650.0000 mg | ORAL_TABLET | ORAL | Status: DC | PRN

## 2019-03-08 MED ORDER — POLYETHYLENE GLYCOL 3350 17 G PO PACK: 17.0000 g | PACK | Freq: Every day | ORAL | 0 refills | Status: AC | PRN

## 2019-03-08 MED ORDER — ONDANSETRON HCL 4 MG PO TABS: 4.0000 mg | ORAL_TABLET | Freq: Four times a day (QID) | ORAL | 0 refills | Status: DC | PRN

## 2019-03-08 MED ORDER — LORATADINE 10 MG PO TABS
10.0000 mg | ORAL_TABLET | Freq: Every day | ORAL | Status: DC
  Administered 2019-03-09 – 2019-03-16 (×8): 10 mg via ORAL
  Filled 2019-03-08 (×8): qty 1

## 2019-03-08 MED ORDER — ONDANSETRON HCL 4 MG/2ML IJ SOLN: 4.0000 mg | Freq: Four times a day (QID) | INTRAMUSCULAR | Status: DC | PRN

## 2019-03-08 MED ORDER — POLYETHYLENE GLYCOL 3350 17 G PO PACK: 17.0000 g | PACK | Freq: Every day | ORAL | Status: DC | PRN

## 2019-03-08 MED ORDER — BISACODYL 10 MG RE SUPP
10.0000 mg | Freq: Every day | RECTAL | Status: DC | PRN
  Administered 2019-03-09: 10 mg via RECTAL
  Filled 2019-03-08: qty 1

## 2019-03-08 MED ORDER — NITROGLYCERIN 0.4 MG SL SUBL
0.4000 mg | SUBLINGUAL_TABLET | SUBLINGUAL | Status: DC | PRN
  Administered 2019-03-13 (×2): 0.4 mg via SUBLINGUAL
  Filled 2019-03-08: qty 1

## 2019-03-08 MED ORDER — LOSARTAN POTASSIUM 50 MG PO TABS
100.0000 mg | ORAL_TABLET | Freq: Every day | ORAL | Status: DC
  Administered 2019-03-09 – 2019-03-16 (×8): 100 mg via ORAL
  Filled 2019-03-08 (×8): qty 2

## 2019-03-08 MED ORDER — POTASSIUM CHLORIDE CRYS ER 20 MEQ PO TBCR
20.0000 meq | EXTENDED_RELEASE_TABLET | Freq: Two times a day (BID) | ORAL | Status: DC
  Administered 2019-03-08 – 2019-03-16 (×16): 20 meq via ORAL
  Filled 2019-03-08 (×16): qty 1

## 2019-03-08 MED ORDER — FLUTICASONE PROPIONATE 50 MCG/ACT NA SUSP
1.0000 | Freq: Every day | NASAL | Status: DC | PRN
  Filled 2019-03-08: qty 16

## 2019-03-08 MED ORDER — METHOCARBAMOL 500 MG PO TABS
500.0000 mg | ORAL_TABLET | Freq: Four times a day (QID) | ORAL | Status: DC | PRN
  Administered 2019-03-08 – 2019-03-16 (×11): 500 mg via ORAL
  Filled 2019-03-08 (×11): qty 1

## 2019-03-08 MED ORDER — VITAMIN D (ERGOCALCIFEROL) 1.25 MG (50000 UNIT) PO CAPS
50000.0000 [IU] | ORAL_CAPSULE | ORAL | Status: DC
  Administered 2019-03-09 – 2019-03-16 (×4): 50000 [IU] via ORAL
  Filled 2019-03-08 (×4): qty 1

## 2019-03-08 MED FILL — Heparin Sodium (Porcine) Inj 1000 Unit/ML: INTRAMUSCULAR | Qty: 30 | Status: AC

## 2019-03-08 MED FILL — Sodium Chloride IV Soln 0.9%: INTRAVENOUS | Qty: 1000 | Status: AC

## 2019-03-08 NOTE — Progress Notes (Signed)
Inpatient Rehabilitation Admissions Coordinator  Inpatient Rehab Consult received. I met with patient at the bedside for rehabilitation assessment. We discussed goals and expectations of an inpatient rehab admission.  Patient prefers an inpt rehab admission and is a candidate to admit. I contacted Dr. Annette Stable by phone and will make the arrangements to admit today.  Danne Baxter, RN, MSN Rehab Admissions Coordinator [phone] 03/08/2019 10:10 AM

## 2019-03-08 NOTE — Progress Notes (Signed)
All questions and concerns addressed, Pt not in distress, gave report to Seth Bake, Therapist, sports. Pt transported to [address] with belongings.

## 2019-03-08 NOTE — TOC Transition Note (Signed)
Transition of Care [facility]) - CM/SW Discharge Note Marvetta Gibbons RN,BSN Transitions of Care Cross Coverage 5N - RN Case Manager [phone]  Patient Details  Name: NAHOMY LIMBURG MRN: [id number] Date of Birth: 11/08/1960  Transition of Care [facility]) CM/SW Contact:  Dawayne Patricia, RN Phone Number: 03/08/2019, 2:17 PM   Clinical Narrative:    Per Pamala Hurry with IP rehab- pt has bed available and would like to to go to IP rehab- plan to admit to CIR today.    Final next level of care: IP Rehab Facility Barriers to Discharge: No Barriers Identified   Patient Goals and CMS Choice Patient states their goals for this hospitalization and ongoing recovery are:: get better CMS Medicare.gov Compare Post Acute Care list provided to:: (discussed via phone due to [place] 19 CM working remotely) Choice offered to / list presented to : Patient  Discharge Placement   [facility] rehab                     Discharge Plan and Services   Discharge Planning Services: CM Consult Post Acute Care Choice: Durable Medical Equipment, Home Health          DME Arranged: Gilford Rile youth DME Agency: [employer] Date DME Agency Contacted: 03/07/19 Time DME Agency Contacted: [phone] Representative spoke with at DME Agency: [place]: PT [place]: [place] ([place]) Date [place]: 03/07/19 Time [place]: 1027 Representative spoke with at [place]: [place] ([place]) Interventions     Readmission Risk Interventions Readmission Risk Prevention Plan 03/08/2019  Transportation Screening Complete  PCP or Specialist Appt within 5-7 Days Complete  Home Care Screening Complete  Medication Review (RN CM) Complete  Some recent data might be hidden

## 2019-03-08 NOTE — Progress Notes (Signed)
Meredith Staggers, MD  Physician  Physical Medicine and Rehabilitation  PMR Pre-admission  Signed  Date of Service:  03/08/2019 10:29 AM       Related encounter: Admission (Discharged) from 03/04/2019 in [facility]         Show:Clear all '[x]' Manual'[x]' Template'[x]' Copied  Added by: '[x]' Cristina Gong, RN'[x]' Meredith Staggers, MD  '[]' Hover for details PMR Admission Coordinator Pre-Admission Assessment  Patient: Jodi Bowman is an 58 y.o., female MRN: [id number] DOB: [birth year] Height: '4\' 11"'  (149.9 cm) Weight: 66.7 kg  Insurance Information HMO:     PPO:      PCP:      IPA:      80/20:      OTHER: no HMO PRIMARY: Medicare a and b      Policy#: [id number]      Subscriber: pt Benefits:  Phone #: passport one online     Name: 6/9 Eff. Date: a 09/29/2016   B 09/29/2017  Deduct: $1408      Out of Pocket Max: none      Life Max: none CIR: 100%      SNF: 20 full days Outpatient: 80%     Co-Pay: 20% Home Health: 100%      Co-Pay: none DME: 80%     Co-Pay: 205 Providers: pt choice  SECONDARY: Medicaid      Policy#: [id number]      Subscriber: pt Passport one online 6/9 [id number]  Medicaid Application Date:       Case Manager:  Disability Application Date:       Case Worker:   The Data Collection Information Summary for patients in Inpatient Rehabilitation Facilities with attached Privacy Act [place] Records was provided and verbally reviewed with: Patient  Emergency Contact Information         Contact Information    Name Relation Home Work Mobile   [place] Spouse [phone]  [phone]      Current Medical History  Patient Admitting Diagnosis: lumbar stenosis and radiculopathy  History of Present Illness: 58 year old right-handed female with history of hypertension, hyperlipidemia, fibromyalgia, 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 Presented 03/04/2019 with chronic  intractable back pain rating to lower extremities failing all conservative management. Work-up demonstrated evidence of marked disc degeneration with disc space collapse and significant foraminal stenosis with radiculopathy at L4-5 and L5-S1. Underwent bilateral L4-5, L5-S1 decompressive laminotomies and foraminotomies with posterior lumbar interbody fusion 03/04/2019 per Dr. Annette Stable. Hospital course pain management. Back brace when out of bed applied in sitting position. Acute blood loss anemia 9.1. Patient with intermittent bouts of orthostatic hypotension transient likely multifactorial and monitored that has improved. She did initially receive some IV fluids. Postoperative urinary retention improved with routine toileting schedule.    Patient's medical record from [facility] has been reviewed by the rehabilitation admission coordinator and physician.  Past Medical History      Past Medical History:  Diagnosis Date   Arthritis    Calculus of gallbladder without mention of cholecystitis or obstruction    Dizziness    Fibromyalgia    GERD (gastroesophageal reflux disease)    Goiter    Hypercholesteremia    Hypertension    Lower back pain    Migraine    Nontoxic uninodular goiter    sees dr vollmer at [facility]   Obesity    PONV (postoperative nausea and vomiting)    Sleep apnea  STOPBANG=5   Stroke ([place]) 2000    Family History   family history includes Cancer in her brother and maternal grandmother; Dementia in her mother; Diabetes in her mother; Hypertension in her mother; Other in her father; Seizures in her mother; Transient ischemic attack in her mother.  Prior Rehab/Hospitalizations Has the patient had prior rehab or hospitalizations prior to admission? Yes  Has the patient had major surgery during 100 days prior to admission? Yes             Current Medications  Current Facility-Administered Medications:    0.9 %   sodium chloride infusion, 250 mL, Intravenous, Continuous, Pool, Mallie Mussel, MD, Last Rate: 1 mL/hr at 03/06/19 2106, 250 mL at 03/06/19 2106   acetaminophen (TYLENOL) tablet 650 mg, 650 mg, Oral, Q4H PRN, 650 mg at 03/05/19 1757 **OR** acetaminophen (TYLENOL) suppository 650 mg, 650 mg, Rectal, Q4H PRN, Earnie Larsson, MD   aMILoride [facility]) tablet 5 mg, 5 mg, Oral, Daily, Pool, Mallie Mussel, MD, 5 mg at 03/08/19 3536   amLODipine (NORVASC) tablet 5 mg, 5 mg, Oral, Daily, Pool, Mallie Mussel, MD, 5 mg at 03/08/19 1000   aspirin EC tablet 81 mg, 81 mg, Oral, Daily, Earnie Larsson, MD, 81 mg at 03/08/19 0933   bisacodyl (DULCOLAX) suppository 10 mg, 10 mg, Rectal, Daily PRN, Earnie Larsson, MD   DULoxetine (CYMBALTA) DR capsule 30 mg, 30 mg, Oral, Daily, Lady Deutscher, MD, 30 mg at 03/08/19 0933   fluticasone (FLONASE) 50 MCG/ACT nasal spray 1 spray, 1 spray, Each Nare, Daily PRN, Earnie Larsson, MD   folic acid (FOLVITE) tablet 1 mg, 1 mg, Oral, Daily, Pool, Mallie Mussel, MD, 1 mg at 03/08/19 0933   HYDROcodone-acetaminophen (NORCO) 10-325 MG per tablet 1 tablet, 1 tablet, Oral, Q4H PRN, Earnie Larsson, MD, 1 tablet at 03/08/19 0650   HYDROmorphone (DILAUDID) injection 1 mg, 1 mg, Intravenous, Q2H PRN, Earnie Larsson, MD   loratadine (CLARITIN) tablet 10 mg, 10 mg, Oral, Daily, Pool, Mallie Mussel, MD, 10 mg at 03/08/19 0933   losartan (COZAAR) tablet 100 mg, 100 mg, Oral, Daily, Pool, Mallie Mussel, MD, 100 mg at 03/08/19 1443   menthol-cetylpyridinium (CEPACOL) lozenge 3 mg, 1 lozenge, Oral, PRN **OR** phenol (CHLORASEPTIC) mouth spray 1 spray, 1 spray, Mouth/Throat, PRN, Earnie Larsson, MD   metoprolol succinate (TOPROL-XL) 24 hr tablet 50 mg, 50 mg, Oral, Daily, Pool, Mallie Mussel, MD, 50 mg at 03/08/19 1000   nitroGLYCERIN (NITROSTAT) SL tablet 0.4 mg, 0.4 mg, Sublingual, Q5 min PRN, Earnie Larsson, MD   ondansetron (ZOFRAN) tablet 4 mg, 4 mg, Oral, Q6H PRN **OR** ondansetron (ZOFRAN) injection 4 mg, 4 mg, Intravenous, Q6H PRN, Pool, Mallie Mussel,  MD   oxyCODONE (Oxy IR/ROXICODONE) immediate release tablet 10 mg, 10 mg, Oral, Q3H PRN, Earnie Larsson, MD, 10 mg at 03/06/19 0512   pantoprazole (PROTONIX) EC tablet 40 mg, 40 mg, Oral, BID, Pool, Mallie Mussel, MD, 40 mg at 03/08/19 0933   polyethylene glycol (MIRALAX / GLYCOLAX) packet 17 g, 17 g, Oral, Daily PRN, Pool, Mallie Mussel, MD   potassium chloride SA (K-DUR) CR tablet 20 mEq, 20 mEq, Oral, BID, Pool, Mallie Mussel, MD, 20 mEq at 03/08/19 0933   pregabalin (LYRICA) capsule 25 mg, 25 mg, Oral, TID, Pool, Mallie Mussel, MD, 25 mg at 03/08/19 0933   sodium chloride flush (NS) 0.9 % injection 3 mL, 3 mL, Intravenous, Q12H, Pool, Mallie Mussel, MD, 3 mL at 03/07/19 2146   sodium chloride flush (NS) 0.9 % injection 3 mL, 3 mL, Intravenous, PRN, Earnie Larsson, MD  sodium phosphate (FLEET) 7-19 GM/118ML enema 1 enema, 1 enema, Rectal, Once PRN, Earnie Larsson, MD   Vitamin D (Ergocalciferol) (DRISDOL) capsule 50,000 Units, 50,000 Units, Oral, Q M,W,F, Earnie Larsson, MD, 50,000 Units at 03/07/19 1019  Patients Current Diet:     Diet Order                  Diet regular Room service appropriate? Yes; Fluid consistency: Thin  Diet effective now               Precautions / Restrictions Precautions Precautions: Back, Fall Precaution Booklet Issued: No Precaution Comments: Verbally reviewed back precautions.   Pt unable to recall and appeared as if this was the first time she was hearing this information Spinal Brace: Lumbar corset, Applied in sitting position Restrictions Weight Bearing Restrictions: No Other Position/Activity Restrictions: monitor HR- has been braycardic this admission. syncopal episode walking in the bathroom on OT eval   Has the patient had 2 or more falls or a fall with injury in the past year? Yes patient states has fallen at least 3 times over past 6 months when legs give out  Prior Activity Level Limited Community (1-2x/wk): Mod I with cane; drives; disabled due to back  isssues  Prior Functional Level Self Care: Did the patient need help bathing, dressing, using the toilet or eating? Independent  Indoor Mobility: Did the patient need assistance with walking from room to room (with or without device)? Independent  Stairs: Did the patient need assistance with internal or external stairs (with or without device)? Independent  Functional Cognition: Did the patient need help planning regular tasks such as shopping or remembering to take medications? [place] Devices/Equipment: Cane (specify quad or straight) Home Equipment: Cane - single point  Prior Device Use: Indicate devices/aids used by the patient prior to current illness, exacerbation or injury? cane  Current Functional Level Cognition  Overall Cognitive Status: Within Functional Limits for tasks assessed Orientation Level: Oriented X4 General Comments: pt is very specific about feeling light headed with standing    Extremity Assessment (includes Sensation/Coordination)  Upper Extremity Assessment: Generalized weakness  Lower Extremity Assessment: Defer to PT evaluation    ADLs  Overall ADL's : Needs assistance/impaired Eating/Feeding: Set up, Sitting Grooming: Set up, Minimal assistance, Sitting Upper Body Bathing: Moderate assistance, Sitting Lower Body Bathing: Maximal assistance, Sit to/from stand Upper Body Dressing : Moderate assistance, Sitting Lower Body Dressing: Maximal assistance, Sit to/from stand Toilet Transfer: Moderate assistance, +2 for safety/equipment, Ambulation, RW(3n1 over toilet) Toileting- Clothing Manipulation and Hygiene: Maximal assistance, Sit to/from stand Tub/ Shower Transfer: Moderate assistance, +2 for safety/equipment, Ambulation, 3 in 1, Rolling walker Functional mobility during ADLs: Moderate assistance, +2 for safety/equipment, Rolling walker General ADL Comments: Pt completed bed mobility,  ambualted to bathroom where she had a syncopal episode, Completed toilet transfer then SPT to recliner and vitals assessed.     Mobility  Overal bed mobility: Needs Assistance Bed Mobility: Rolling, Sidelying to Sit Rolling: Min guard Sidelying to sit: Min assist Sit to sidelying: Mod assist General bed mobility comments: Cues for spinal precautions with min assistance to advance LEs and elevate trunk into sitting edge of bed.      Transfers  Overall transfer level: Needs assistance Equipment used: Rolling walker (2 wheeled) Transfers: Sit to/from Stand Sit to Stand: Min assist Stand pivot transfers: Mod assist General transfer comment: Pt attempted to stand unassisted but unable to clear her hips  to achieve standing.  Require min assistance to boost into standing.      Ambulation / Gait / Stairs / Wheelchair Mobility  Ambulation/Gait Ambulation/Gait assistance: Herbalist (Feet): 12 Feet Assistive device: Rolling walker (2 wheeled) Gait Pattern/deviations: Step-through pattern, Trunk flexed, Antalgic General Gait Details: Slow steps with cues for scapular retraction and upper trunk control.  Pt required cues for sequencing and RW safety.   Gait velocity: very slow cadence Gait velocity interpretation: <1.8 ft/sec, indicate of risk for recurrent falls    Posture / Balance Dynamic Sitting Balance Sitting balance - Comments: poor in the chair but fair bedside Balance Overall balance assessment: Needs assistance Sitting-balance support: Feet supported, Bilateral upper extremity supported Sitting balance-Leahy Scale: Fair Sitting balance - Comments: poor in the chair but fair bedside Standing balance support: Bilateral upper extremity supported, During functional activity Standing balance-Leahy Scale: Poor Standing balance comment: Reliant on BUE support     Special needs/care consideration BiPAP/CPAP  N/a CPM  N/a Continuous Drip IV  N/a Dialysis n/a Life  Vest  N/a Oxygen  N/a Special Bed  N/a Trach Size  N/a Wound Vac n/a Skin surgical incision Bowel mgmt:  LBM 6/5 Bladder mgmt: continent Diabetic mgmt: n/a Behavioral consideration  N/a Chemo/radiation  N/a   Previous Home Environment  Living Arrangements: Spouse/significant other, Children(and 65 year old daughter; has 8 children)  Lives With: Spouse, Daughter(84 year old daughter) Available Help at Discharge: (Spouse works 10 am until 43 pm; 15 year old at home) Type of Home: House Home Layout: One level Home Access: Stairs to enter Entrance Stairs-Rails: None Technical brewer of Steps: 4 Bathroom Shower/Tub: Optometrist: Yes How Accessible: Accessible via walker Home Care Services: No  Discharge Living Setting Plans for Discharge Living Setting: Patient's home, Lives with (comment)(spouse and 22 year old daughter) Type of Home at Discharge: House Discharge Home Layout: One level Discharge Home Access: Stairs to enter Entrance Stairs-Rails: None Entrance Stairs-Number of Steps: 4 Discharge Bathroom Shower/Tub: Tub/shower unit Discharge Bathroom Toilet: Standard Discharge Bathroom Accessibility: Yes How Accessible: Accessible via walker Does the patient have any problems obtaining your medications?: No  Social/Family/Support Systems Patient Roles: Spouse, Parent(8 children ages 2 to 58) Contact Information: spouse , Gwyndolyn Saxon Anticipated Caregiver: spouse and adult children Anticipated Caregiver's Contact Information: see above Ability/Limitations of Caregiver: spouse works dietary at [employer] 10 am until 7 pm Caregiver Availability: Intermittent Discharge Plan Discussed with Primary Caregiver: Yes Is Caregiver In Agreement with Plan?: Yes Does Caregiver/Family have Issues with Lodging/Transportation while Pt is in Rehab?: No  Goals/Additional Needs Patient/Family Goal for Rehab: Mod I with PT  and OT Expected length of stay: ELOS 7 to 10 days Pt/Family Agrees to Admission and willing to participate: Yes Program Orientation Provided & Reviewed with Pt/Caregiver Including Roles  & Responsibilities: Yes  Decrease burden of Care through IP rehab admission: n/a  Possible need for SNF placement upon discharge: no  Patient Condition: I have reviewed medical records from [facility] , spoken with  patient. I met with patient at the bedside for inpatient rehabilitation assessment.  Patient will benefit from ongoing PT and OT, can actively participate in 3 hours of therapy a day 5 days of the week, and can make measurable gains during the admission.  Patient will also benefit from the coordinated team approach during an Inpatient Acute Rehabilitation admission.  The patient will receive intensive therapy as well as Rehabilitation physician, nursing, social  worker, and care management interventions.  Due to bladder management, bowel management, safety, skin/wound care, disease management, medication administration, pain management, patient education and 1 the patient requires 24 hour a day rehabilitation nursing.  The patient is currently min assist with mobility and basic ADLs.  Discharge setting and therapy post discharge at home with home health is anticipated.  Patient has agreed to participate in the Acute Inpatient Rehabilitation Program and will admit today.  Preadmission Screen Completed By:  Cleatrice Burke RN MSN, 03/08/2019 10:29 AM ______________________________________________________________________   Discussed status with Dr. Naaman Plummer  on  03/08/2019 at  1036 and received approval for admission today.  Admission Coordinator:  Cleatrice Burke, RN, time  6154 Date  03/08/2019   Assessment/Plan: Diagnosis: lumbar stenosis with radic 1. Does the need for close, 24 hr/day Medical supervision in concert with the patient's rehab needs make it unreasonable for this  patient to be served in a less intensive setting? Yes 2. Co-Morbidities requiring supervision/potential complications: htn, fms, prior cva 3. Due to bladder management, bowel management, safety, skin/wound care, disease management, medication administration, pain management and patient education, does the patient require 24 hr/day rehab nursing? Yes 4. Does the patient require coordinated care of a physician, rehab nurse, PT (1-2 hrs/day, 5 days/week) and OT (1-2 hrs/day, 5 days/week) to address physical and functional deficits in the context of the above medical diagnosis(es)? Yes Addressing deficits in the following areas: balance, endurance, locomotion, strength, transferring, bowel/bladder control, bathing, dressing, feeding, grooming, toileting and psychosocial support 5. Can the patient actively participate in an intensive therapy program of at least 3 hrs of therapy 5 days a week? Yes 6. The potential for patient to make measurable gains while on inpatient rehab is excellent 7. Anticipated functional outcomes upon discharge from inpatients are: modified independent PT, modified independent OT, n/a SLP 8. Estimated rehab length of stay to reach the above functional goals is: 7-10 days 9. Anticipated D/C setting: Home 10. Anticipated post D/C treatments: [place] therapy 11. Overall Rehab/Functional Prognosis: excellent  MD Signature: Meredith Staggers, MD, [facility] 03/08/2019         Revision History

## 2019-03-08 NOTE — Plan of Care (Signed)
  Problem: Clinical Measurements: Goal: Ability to maintain clinical measurements within normal limits will improve Outcome: Progressing   Problem: Activity: Goal: Risk for activity intolerance will decrease Outcome: Progressing   Problem: Nutrition: Goal: Adequate nutrition will be maintained Outcome: Progressing   Problem: Coping: Goal: Level of anxiety will decrease Outcome: Progressing   Problem: Pain Managment: Goal: General experience of comfort will improve Outcome: Progressing   Problem: Skin Integrity: Goal: Risk for impaired skin integrity will decrease Outcome: Progressing

## 2019-03-08 NOTE — Discharge Summary (Signed)
Physician Discharge Summary  Patient ID: Jodi Bowman MRN: [id number] DOB/AGE: Feb 10, 1961 58 y.o.  Admit date: 03/04/2019 Discharge date: 03/08/2019  Admission Diagnoses:  L4-5, L5-S1 degenerative disc disease with foraminal stenosis and chronic back and radiculopathy  Discharge Diagnoses:  Active Problems:   Lumbar foraminal stenosis   Orthostatic hypotension   Postoperative urinary retention   Discharged Condition: good  Hospital Course: Patient underwent posterior lumbar decompression and fusion at L4-5 and L5-S1 on 03/04/2019 with Dr. Annette Stable of Neurosurgery. She suffered from orthostatic hypotension in the immediate post operative period, but this has improved over the past couple of days. She has worked with PT and OT, who are recommending CIR. She is agreeable to this plan of care and is ready for transfer.  Consults: rehabilitation medicine  Significant Diagnostic Studies: radiology: Dg Lumbar Spine 2-3 Views  Result Date: 03/04/2019 CLINICAL DATA:  Surgical posterior fusion of L4-5 and L5-S1. EXAM: DG C-ARM 61-120 MIN; LUMBAR SPINE - 2-3 VIEW FLUOROSCOPY TIME:  40 seconds. COMPARISON:  Radiographs of November 04, 2018. FINDINGS: Two intraoperative fluoroscopic images of the lower lumbar spine demonstrate the patient be status post surgical posterior fusion of L4-5 and L5-S1 with bilateral intrapedicular screw placement and interbody fusion. IMPRESSION: Fluoroscopic guidance was provided during surgical posterior fusion of L4-5 and L5-S1. Electronically Signed   By: Marijo Conception M.D.   On: 03/04/2019 13:38   Dg C-arm 1-60 Min  Result Date: 03/04/2019 CLINICAL DATA:  Surgical posterior fusion of L4-5 and L5-S1. EXAM: DG C-ARM 61-120 MIN; LUMBAR SPINE - 2-3 VIEW FLUOROSCOPY TIME:  40 seconds. COMPARISON:  Radiographs of November 04, 2018. FINDINGS: Two intraoperative fluoroscopic images of the lower lumbar spine demonstrate the patient be status post surgical posterior fusion of L4-5 and  L5-S1 with bilateral intrapedicular screw placement and interbody fusion. IMPRESSION: Fluoroscopic guidance was provided during surgical posterior fusion of L4-5 and L5-S1. Electronically Signed   By: Marijo Conception M.D.   On: 03/04/2019 13:38     Treatments: surgery: Bilateral L4-5 and L5-S1 decompressive laminotomies and foraminotomies,, more than would be required for simple interbody fusion alone.  L4-5, L5-S1 posterior lumbar interbody fusion utilizing interbody cages and locally harvested autograft  L4-5 S1 posterior lateral arthrodesis utilizing segmental pedicle screw fixation and local autograft  Discharge Exam: Blood pressure (!) 110/56, pulse 72, temperature 97.9 F (36.6 C), temperature source Oral, resp. rate 16, height 4\' 11"  (1.499 m), weight 66.7 kg, SpO2 100 %.   Alert and oriented x 4 PERRLA MAE, Strength 5/5 BUE, BLE CN II-XII grossly intact Abdomen soft Incision clean, dry, and intact; Honeycomb removed and steri strips are in place  Disposition: Discharge disposition: [place] Not Defined        Allergies as of 03/08/2019      Reactions   Shrimp [shellfish Allergy] Anaphylaxis   Naproxen Other (See Comments)   Makes stomach cramp and burning badly.      Medication List    STOP taking these medications   benzonatate 100 MG capsule Commonly known as:  TESSALON   celecoxib 100 MG capsule Commonly known as:  CeleBREX   diclofenac sodium 1 % Gel Commonly known as:  VOLTAREN   famotidine 20 MG tablet Commonly known as:  PEPCID   HYDROcodone-homatropine 5-1.5 MG/5ML syrup Commonly known as:  HYCODAN   lidocaine 5 % Commonly known as:  LIDODERM   methocarbamol 500 MG tablet Commonly known as:  ROBAXIN   metoprolol succinate 50 MG  24 hr tablet Commonly known as:  TOPROL-XL   oxyCODONE-acetaminophen 5-325 MG tablet Commonly known as:  Percocet     TAKE these medications   aMILoride 5 MG tablet Commonly known  as:  MIDAMOR Take 5 mg by mouth daily.   amLODipine 5 MG tablet Commonly known as:  NORVASC Take 1 tablet (5 mg total) by mouth daily.   aspirin EC 81 MG tablet Take 81 mg by mouth daily.   bisacodyl 10 MG suppository Commonly known as:  DULCOLAX Place 1 suppository (10 mg total) rectally daily as needed for moderate constipation.   Cholecalciferol 1.25 MG (50000 UT) capsule Take 50,000 Units by mouth 3 (three) times a week.   DULoxetine 60 MG capsule Commonly known as:  CYMBALTA Take 60 mg by mouth daily.   fluticasone 50 MCG/ACT nasal spray Commonly known as:  FLONASE Place 1 spray into both nostrils daily as needed for allergies.   folic acid 1 MG tablet Commonly known as:  FOLVITE Take 1 mg by mouth daily.   HYDROcodone-acetaminophen 10-325 MG tablet Commonly known as:  NORCO Take 1 tablet by mouth every 4 (four) hours as needed for moderate pain ((score 4 to 6)). What changed:    when to take this  reasons to take this   loratadine 10 MG tablet Commonly known as:  CLARITIN Take 10 mg by mouth daily.   losartan 100 MG tablet Commonly known as:  COZAAR Take 100 mg by mouth daily.   Lyrica 25 MG capsule Generic drug:  pregabalin Take 25 mg by mouth 3 (three) times daily.   nitroGLYCERIN 0.4 MG SL tablet Commonly known as:  NITROSTAT Place 1 tablet (0.4 mg total) under the tongue every 5 (five) minutes x 3 doses as needed for chest pain. What changed:    when to take this  additional instructions   ondansetron 4 MG tablet Commonly known as:  ZOFRAN Take 1 tablet (4 mg total) by mouth every 6 (six) hours as needed for nausea or vomiting.   Oxycodone HCl 10 MG Tabs Take 1 tablet (10 mg total) by mouth every 3 (three) hours as needed for severe pain ((score 7 to 10)).   pantoprazole 40 MG tablet Commonly known as:  PROTONIX Take 40 mg by mouth 2 (two) times daily.   polyethylene glycol 17 g packet Commonly known as:  MIRALAX / GLYCOLAX Take 17 g  by mouth daily as needed for mild constipation.   potassium chloride SA 20 MEQ tablet Commonly known as:  K-DUR Take 1 tablet (20 mEq total) by mouth 2 (two) times daily.   triamcinolone cream 0.1 % Commonly known as:  KENALOG Apply 1 application topically 2 (two) times daily as needed for rash.            Durable Medical Equipment  (From admission, onward)         Start     Ordered   03/07/19 1020  For home use only DME Walker rolling  Once    Question:  Patient needs a walker to treat with the following condition  Answer:  S/P lumbar fusion   03/07/19 1020   03/04/19 1514  DME Walker rolling  Once    Question:  Patient needs a walker to treat with the following condition  Answer:  Lumbar foraminal stenosis   03/04/19 1513   03/04/19 1514  DME 3 n 1  Once     03/04/19 1513  Follow-up [place], [facility] Follow up.   Why:  A representative from [facility] will contact you to arrange start date and time for your therapy. Contact information: [place] [address] [phone]        Earnie Larsson, MD. Schedule an appointment as soon as possible for a visit in 2 week(s).   Specialty:  Neurosurgery Contact information: [address] [place] [address] [place] [address] [phone]           Signed: Patricia Nettle 03/08/2019, 11:03 AM

## 2019-03-08 NOTE — Progress Notes (Signed)
Patient arrived to room with nurse. Patient was in 7/10 pain but the previous nurse just gave patient pain medication. Patient is alert and oriented and resting comfortably in bed. All needs are met this time.

## 2019-03-08 NOTE — PMR Pre-admission (Signed)
PMR Admission Coordinator Pre-Admission Assessment  Patient: Jodi Bowman is an 58 y.o., female MRN: [id number] DOB: 1960-10-12 Height: '4\' 11"'  (149.9 cm) Weight: 66.7 kg  Insurance Information HMO:     PPO:      PCP:      IPA:      80/20:      OTHER: no HMO PRIMARY: Medicare a and b      Policy#: [id number]      Subscriber: pt Benefits:  Phone #: passport one online     Name: 6/9 Eff. Date: a 09/29/2016   B 09/29/2017  Deduct: $1408      Out of Pocket Max: none      Life Max: none CIR: 100%      SNF: 20 full days Outpatient: 80%     Co-Pay: 20% Home Health: 100%      Co-Pay: none DME: 80%     Co-Pay: 205 Providers: pt choice  SECONDARY: Medicaid      Policy#: [id number]      Subscriber: pt Passport one online 6/9 [id number]  Medicaid Application Date:       Case Manager:  Disability Application Date:       Case Worker:   The "Data Collection Information Summary" for patients in Inpatient Rehabilitation Facilities with attached "Privacy Act [place] Records" was provided and verbally reviewed with: Patient  Emergency Contact Information Contact Information    Name Relation Home Work Mobile   [place] Spouse [phone]  [phone]      Current Medical History  Patient Admitting Diagnosis: lumbar stenosis and radiculopathy  History of Present Illness: 58 year old right-handed female with history of hypertension, hyperlipidemia, fibromyalgia, 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  Presented 03/04/2019 with chronic intractable back pain rating to lower extremities failing all conservative management.  Work-up demonstrated evidence of marked disc degeneration with disc space collapse and significant foraminal stenosis with radiculopathy at L4-5 and L5-S1.  Underwent bilateral L4-5, L5-S1 decompressive laminotomies and foraminotomies with posterior lumbar interbody fusion 03/04/2019 per Dr. Annette Stable.  Hospital course pain management.  Back brace when out of bed  applied in sitting position.  Acute blood loss anemia 9.1.  Patient with intermittent bouts of orthostatic hypotension transient likely multifactorial and monitored that has improved.  She did initially receive some IV fluids.  Postoperative urinary retention improved with routine toileting schedule.     Patient's medical record from [facility] has been reviewed by the rehabilitation admission coordinator and physician.  Past Medical History  Past Medical History:  Diagnosis Date  . Arthritis   . Calculus of gallbladder without mention of cholecystitis or obstruction   . Dizziness   . Fibromyalgia   . GERD (gastroesophageal reflux disease)   . Goiter   . Hypercholesteremia   . Hypertension   . Lower back pain   . Migraine   . Nontoxic uninodular goiter    sees dr vollmer at [facility]  . Obesity   . PONV (postoperative nausea and vomiting)   . Sleep apnea    STOPBANG=5  . Stroke [facility]) 2000    Family History   family history includes Cancer in her brother and maternal grandmother; Dementia in her mother; Diabetes in her mother; Hypertension in her mother; Other in her father; Seizures in her mother; Transient ischemic attack in her mother.  Prior Rehab/Hospitalizations Has the patient had prior rehab or hospitalizations prior to admission? Yes  Has the patient had major surgery during 100 days prior to admission?  Yes   Current Medications  Current Facility-Administered Medications:  .  0.9 %  sodium chloride infusion, 250 mL, Intravenous, Continuous, Pool, Henry, MD, Last Rate: 1 mL/hr at 03/06/19 2106, 250 mL at 03/06/19 2106 .  acetaminophen (TYLENOL) tablet 650 mg, 650 mg, Oral, Q4H PRN, 650 mg at 03/05/19 1757 **OR** acetaminophen (TYLENOL) suppository 650 mg, 650 mg, Rectal, Q4H PRN, Earnie Larsson, MD .  aMILoride [facility]) tablet 5 mg, 5 mg, Oral, Daily, Pool, Mallie Mussel, MD, 5 mg at 03/08/19 4174 .  amLODipine (NORVASC) tablet 5 mg, 5 mg, Oral, Daily, Pool, Henry, MD,  5 mg at 03/08/19 1000 .  aspirin EC tablet 81 mg, 81 mg, Oral, Daily, Earnie Larsson, MD, 81 mg at 03/08/19 0933 .  bisacodyl (DULCOLAX) suppository 10 mg, 10 mg, Rectal, Daily PRN, Pool, Mallie Mussel, MD .  DULoxetine (CYMBALTA) DR capsule 30 mg, 30 mg, Oral, Daily, Lady Deutscher, MD, 30 mg at 03/08/19 0933 .  fluticasone (FLONASE) 50 MCG/ACT nasal spray 1 spray, 1 spray, Each Nare, Daily PRN, Earnie Larsson, MD .  folic acid (FOLVITE) tablet 1 mg, 1 mg, Oral, Daily, Pool, Mallie Mussel, MD, 1 mg at 03/08/19 0933 .  HYDROcodone-acetaminophen (NORCO) 10-325 MG per tablet 1 tablet, 1 tablet, Oral, Q4H PRN, Earnie Larsson, MD, 1 tablet at 03/08/19 0650 .  HYDROmorphone (DILAUDID) injection 1 mg, 1 mg, Intravenous, Q2H PRN, Earnie Larsson, MD .  loratadine (CLARITIN) tablet 10 mg, 10 mg, Oral, Daily, Earnie Larsson, MD, 10 mg at 03/08/19 0933 .  losartan (COZAAR) tablet 100 mg, 100 mg, Oral, Daily, Pool, Mallie Mussel, MD, 100 mg at 03/08/19 0814 .  menthol-cetylpyridinium (CEPACOL) lozenge 3 mg, 1 lozenge, Oral, PRN **OR** phenol (CHLORASEPTIC) mouth spray 1 spray, 1 spray, Mouth/Throat, PRN, Pool, Henry, MD .  metoprolol succinate (TOPROL-XL) 24 hr tablet 50 mg, 50 mg, Oral, Daily, Pool, Henry, MD, 50 mg at 03/08/19 1000 .  nitroGLYCERIN (NITROSTAT) SL tablet 0.4 mg, 0.4 mg, Sublingual, Q5 min PRN, Pool, Mallie Mussel, MD .  ondansetron (ZOFRAN) tablet 4 mg, 4 mg, Oral, Q6H PRN **OR** ondansetron (ZOFRAN) injection 4 mg, 4 mg, Intravenous, Q6H PRN, Earnie Larsson, MD .  oxyCODONE (Oxy IR/ROXICODONE) immediate release tablet 10 mg, 10 mg, Oral, Q3H PRN, Earnie Larsson, MD, 10 mg at 03/06/19 0512 .  pantoprazole (PROTONIX) EC tablet 40 mg, 40 mg, Oral, BID, Earnie Larsson, MD, 40 mg at 03/08/19 0933 .  polyethylene glycol (MIRALAX / GLYCOLAX) packet 17 g, 17 g, Oral, Daily PRN, Pool, Mallie Mussel, MD .  potassium chloride SA (K-DUR) CR tablet 20 mEq, 20 mEq, Oral, BID, Earnie Larsson, MD, 20 mEq at 03/08/19 0933 .  pregabalin (LYRICA) capsule 25 mg, 25 mg,  Oral, TID, Earnie Larsson, MD, 25 mg at 03/08/19 0933 .  sodium chloride flush (NS) 0.9 % injection 3 mL, 3 mL, Intravenous, Q12H, Earnie Larsson, MD, 3 mL at 03/07/19 2146 .  sodium chloride flush (NS) 0.9 % injection 3 mL, 3 mL, Intravenous, PRN, Earnie Larsson, MD .  sodium phosphate (FLEET) 7-19 GM/118ML enema 1 enema, 1 enema, Rectal, Once PRN, Earnie Larsson, MD .  Vitamin D (Ergocalciferol) (DRISDOL) capsule 50,000 Units, 50,000 Units, Oral, Q M,W,F, Earnie Larsson, MD, 50,000 Units at 03/07/19 1019  Patients Current Diet:  Diet Order            Diet regular Room service appropriate? Yes; Fluid consistency: Thin  Diet effective now              Precautions / Restrictions Precautions Precautions:  Back, Fall Precaution Booklet Issued: No Precaution Comments: Verbally reviewed back precautions.   Pt unable to recall and appeared as if this was the first time she was hearing this information Spinal Brace: Lumbar corset, Applied in sitting position Restrictions Weight Bearing Restrictions: No Other Position/Activity Restrictions: monitor HR- has been braycardic this admission. syncopal episode walking in the bathroom on OT eval   Has the patient had 2 or more falls or a fall with injury in the past year? Yes patient states has fallen at least 3 times over past 6 months when legs give out  Prior Activity Level Limited Community (1-2x/wk): Mod I with cane; drives; disabled due to back isssues  Prior Functional Level Self Care: Did the patient need help bathing, dressing, using the toilet or eating? Independent  Indoor Mobility: Did the patient need assistance with walking from room to room (with or without device)? Independent  Stairs: Did the patient need assistance with internal or external stairs (with or without device)? Independent  Functional Cognition: Did the patient need help planning regular tasks such as shopping or remembering to take medications? [place] / [place] Devices/Equipment: Cane (specify quad or straight) Home Equipment: Cane - single point  Prior Device Use: Indicate devices/aids used by the patient prior to current illness, exacerbation or injury? cane  Current Functional Level Cognition  Overall Cognitive Status: Within Functional Limits for tasks assessed Orientation Level: Oriented X4 General Comments: pt is very specific about feeling light headed with standing    Extremity Assessment (includes Sensation/Coordination)  Upper Extremity Assessment: Generalized weakness  Lower Extremity Assessment: Defer to PT evaluation    ADLs  Overall ADL's : Needs assistance/impaired Eating/Feeding: Set up, Sitting Grooming: Set up, Minimal assistance, Sitting Upper Body Bathing: Moderate assistance, Sitting Lower Body Bathing: Maximal assistance, Sit to/from stand Upper Body Dressing : Moderate assistance, Sitting Lower Body Dressing: Maximal assistance, Sit to/from stand Toilet Transfer: Moderate assistance, +2 for safety/equipment, Ambulation, RW(3n1 over toilet) Toileting- Clothing Manipulation and Hygiene: Maximal assistance, Sit to/from stand Tub/ Shower Transfer: Moderate assistance, +2 for safety/equipment, Ambulation, 3 in 1, Rolling walker Functional mobility during ADLs: Moderate assistance, +2 for safety/equipment, Rolling walker General ADL Comments: Pt completed bed mobility, ambualted to bathroom where she had a syncopal episode, Completed toilet transfer then SPT to recliner and vitals assessed.     Mobility  Overal bed mobility: Needs Assistance Bed Mobility: Rolling, Sidelying to Sit Rolling: Min guard Sidelying to sit: Min assist Sit to sidelying: Mod assist General bed mobility comments: Cues for spinal precautions with min assistance to advance LEs and elevate trunk into sitting edge of bed.      Transfers  Overall transfer level: Needs assistance Equipment used: Rolling walker (2  wheeled) Transfers: Sit to/from Stand Sit to Stand: Min assist Stand pivot transfers: Mod assist General transfer comment: Pt attempted to stand unassisted but unable to clear her hips to achieve standing.  Require min assistance to boost into standing.      Ambulation / Gait / Stairs / Wheelchair Mobility  Ambulation/Gait Ambulation/Gait assistance: Herbalist (Feet): 12 Feet Assistive device: Rolling walker (2 wheeled) Gait Pattern/deviations: Step-through pattern, Trunk flexed, Antalgic General Gait Details: Slow steps with cues for scapular retraction and upper trunk control.  Pt required cues for sequencing and RW safety.   Gait velocity: very slow cadence Gait velocity interpretation: <1.8 ft/sec, indicate of risk for recurrent falls    Posture / Balance Dynamic Sitting  Balance Sitting balance - Comments: poor in the chair but fair bedside Balance Overall balance assessment: Needs assistance Sitting-balance support: Feet supported, Bilateral upper extremity supported Sitting balance-Leahy Scale: Fair Sitting balance - Comments: poor in the chair but fair bedside Standing balance support: Bilateral upper extremity supported, During functional activity Standing balance-Leahy Scale: Poor Standing balance comment: Reliant on BUE support     Special needs/care consideration BiPAP/CPAP  N/a CPM  N/a Continuous Drip IV  N/a Dialysis n/a Life Vest  N/a Oxygen  N/a Special Bed  N/a Trach Size  N/a Wound Vac n/a Skin surgical incision Bowel mgmt:  LBM 6/5 Bladder mgmt: continent Diabetic mgmt: n/a Behavioral consideration  N/a Chemo/radiation  N/a   Previous Home Environment  Living Arrangements: Spouse/significant other, Children(and 78 year old daughter; has 8 children)  Lives With: Spouse, Daughter(58 year old daughter) Available Help at Discharge: (Spouse works 10 am until 67 pm; 60 year old at home) Type of Home: House Home Layout: One level Home Access:  Stairs to enter Entrance Stairs-Rails: None Technical brewer of Steps: 4 Bathroom Shower/Tub: Optometrist: Yes How Accessible: Accessible via walker Home Care Services: No  Discharge Living Setting Plans for Discharge Living Setting: Patient's home, Lives with (comment)(spouse and 53 year old daughter) Type of Home at Discharge: House Discharge Home Layout: One level Discharge Home Access: Stairs to enter Entrance Stairs-Rails: None Entrance Stairs-Number of Steps: 4 Discharge Bathroom Shower/Tub: Tub/shower unit Discharge Bathroom Toilet: Standard Discharge Bathroom Accessibility: Yes How Accessible: Accessible via walker Does the patient have any problems obtaining your medications?: No  Social/Family/Support Systems Patient Roles: Spouse, Parent(8 children ages 14 to 66) Contact Information: spouse , Gwyndolyn Saxon Anticipated Caregiver: spouse and adult children Anticipated Caregiver's Contact Information: see above Ability/Limitations of Caregiver: spouse works dietary at [employer] 10 am until 7 pm Caregiver Availability: Intermittent Discharge Plan Discussed with Primary Caregiver: Yes Is Caregiver In Agreement with Plan?: Yes Does Caregiver/Family have Issues with Lodging/Transportation while Pt is in Rehab?: No  Goals/Additional Needs Patient/Family Goal for Rehab: Mod I with PT and OT Expected length of stay: ELOS 7 to 10 days Pt/Family Agrees to Admission and willing to participate: Yes Program Orientation Provided & Reviewed with Pt/Caregiver Including Roles  & Responsibilities: Yes  Decrease burden of Care through IP rehab admission: n/a  Possible need for SNF placement upon discharge: no  Patient Condition: I have reviewed medical records from [facility] , spoken with  patient. I met with patient at the bedside for inpatient rehabilitation assessment.  Patient will benefit from ongoing PT and  OT, can actively participate in 3 hours of therapy a day 5 days of the week, and can make measurable gains during the admission.  Patient will also benefit from the coordinated team approach during an Inpatient Acute Rehabilitation admission.  The patient will receive intensive therapy as well as Rehabilitation physician, nursing, social worker, and care management interventions.  Due to bladder management, bowel management, safety, skin/wound care, disease management, medication administration, pain management, patient education and 1 the patient requires 24 hour a day rehabilitation nursing.  The patient is currently min assist with mobility and basic ADLs.  Discharge setting and therapy post discharge at home with home health is anticipated.  Patient has agreed to participate in the Acute Inpatient Rehabilitation Program and will admit today.  Preadmission Screen Completed By:  Cleatrice Burke RN MSN, 03/08/2019 10:29 AM ______________________________________________________________________   Discussed status with Dr.  Naaman Plummer  on  03/08/2019 at  1036 and received approval for admission today.  Admission Coordinator:  Cleatrice Burke, RN, time  6384 Date  03/08/2019   Assessment/Plan: Diagnosis: lumbar stenosis with radic 1. Does the need for close, 24 hr/day Medical supervision in concert with the patient's rehab needs make it unreasonable for this patient to be served in a less intensive setting? Yes 2. Co-Morbidities requiring supervision/potential complications: htn, fms, prior cva 3. Due to bladder management, bowel management, safety, skin/wound care, disease management, medication administration, pain management and patient education, does the patient require 24 hr/day rehab nursing? Yes 4. Does the patient require coordinated care of a physician, rehab nurse, PT (1-2 hrs/day, 5 days/week) and OT (1-2 hrs/day, 5 days/week) to address physical and functional deficits in the context of  the above medical diagnosis(es)? Yes Addressing deficits in the following areas: balance, endurance, locomotion, strength, transferring, bowel/bladder control, bathing, dressing, feeding, grooming, toileting and psychosocial support 5. Can the patient actively participate in an intensive therapy program of at least 3 hrs of therapy 5 days a week? Yes 6. The potential for patient to make measurable gains while on inpatient rehab is excellent 7. Anticipated functional outcomes upon discharge from inpatients are: modified independent PT, modified independent OT, n/a SLP 8. Estimated rehab length of stay to reach the above functional goals is: 7-10 days 9. Anticipated D/C setting: Home 10. Anticipated post D/C treatments: [place] therapy 11. Overall Rehab/Functional Prognosis: excellent  MD Signature: Meredith Staggers, MD, [place] [facility] 03/08/2019

## 2019-03-08 NOTE — H&P (Signed)
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  Presented 03/04/2019 with chronic intractable back pain rating to lower extremities failing all conservative management.  Work-up demonstrated evidence of marked disc degeneration with disc space collapse and significant foraminal stenosis with radiculopathy at L4-5 and L5-S1.  Underwent bilateral L4-5, L5-S1 decompressive laminotomies and foraminotomies with posterior lumbar interbody fusion 03/04/2019 per Dr. Annette Stable.  Hospital course pain management.  Back brace when out of bed applied in sitting position.  Acute blood loss anemia 9.1.  Patient with intermittent bouts of orthostatic hypotension transient likely multifactorial and monitored that has improved.  She did initially receive some IV fluids.  Postoperative urinary retention improved with routine toileting schedule.  Therapy evaluations completed patient was admitted for a comprehensive rehab program.  Review of Systems  Constitutional: Negative for chills and fever.  HENT: Negative for hearing loss.   Eyes: Negative for blurred vision and double vision.  Respiratory: Negative for cough and shortness of breath.   Cardiovascular: Positive for leg swelling. Negative for chest pain and palpitations.  Gastrointestinal: Positive for constipation. Negative for heartburn, nausea and vomiting.       GERD  Genitourinary: Negative for flank pain and hematuria.  Musculoskeletal: Positive for back pain and myalgias.  Skin: Negative for rash.  Neurological: Positive for dizziness.  All other systems reviewed and are negative.  Past Medical History:  Diagnosis Date  . Arthritis   . Calculus of gallbladder without mention of cholecystitis or obstruction   . Dizziness   . Fibromyalgia   . GERD  (gastroesophageal reflux disease)   . Goiter   . Hypercholesteremia   . Hypertension   . Lower back pain   . Migraine   . Nontoxic uninodular goiter    sees dr vollmer at [facility]  . Obesity   . PONV (postoperative nausea and vomiting)   . Sleep apnea    STOPBANG=5  . Stroke [facility]) 2000   Past Surgical History:  Procedure Laterality Date  . ABDOMINAL HYSTERECTOMY    . BOWEL RESECTION N/A 03/06/2018   Procedure: SMALL BOWEL ANASTAMOSIS;  Surgeon: Clovis Riley, MD;  Location: [place];  Service: General;  Laterality: N/A;  . CATARACT EXTRACTION W/ INTRAOCULAR LENS IMPLANT Bilateral   . CESAREAN SECTION  yrs ago   done x 2  . CHOLECYSTECTOMY  01/05/2012   Procedure: LAPAROSCOPIC CHOLECYSTECTOMY WITH INTRAOPERATIVE CHOLANGIOGRAM;  Surgeon: Pedro Earls, MD;  Location: [facility];  Service: General;  Laterality: N/A;  . COLONOSCOPY  10/08/2012   Procedure: COLONOSCOPY;  Surgeon: Beryle Beams, MD;  Location: [facility];  Service: Endoscopy;  Laterality: N/A;  . KNEE ARTHROSCOPY  one 1995 and 1 in 1997   both knees done  . LAPAROSCOPY N/A 03/06/2018   Procedure: LAPAROSCOPY DIAGNOSTIC WITH LYSIS OF ADHESIONS;  Surgeon: Clovis Riley, MD;  Location: [place];  Service: General;  Laterality: N/A;  . LAPAROTOMY N/A 03/06/2018   Procedure: EXPLORATORY LAPAROTOMY, RESECTION OF DISTAL ROUX, CLOSURE OF INTERNAL HERNIA;  Surgeon: Clovis Riley, MD;  Location: [place];  Service: General;  Laterality: N/A;  . POSTERIOR LUMBAR FUSION  03/04/2019  . surgery for endometriosis  yrs ago  . thryoid biopsy  December 01, 2011    at [facility]   Family History  Problem Relation Age of Onset  . Diabetes  Mother   . Hypertension Mother   . Transient ischemic attack Mother   . Seizures Mother   . Dementia Mother   . Other Father        MVA  . Cancer Brother        colon and lung  . Cancer Maternal Grandmother        colon   Social History:  reports that she has never smoked. She has never used smokeless  tobacco. She reports previous alcohol use. She reports that she does not use drugs. Allergies:  Allergies  Allergen Reactions  . Shrimp [Shellfish Allergy] Anaphylaxis  . Naproxen Other (See Comments)    Makes stomach cramp and burning badly.   Medications Prior to Admission  Medication Sig Dispense Refill  . aMILoride (MIDAMOR) 5 MG tablet Take 5 mg by mouth daily.    Marland Kitchen amLODipine (NORVASC) 5 MG tablet Take 1 tablet (5 mg total) by mouth daily. 30 tablet 1  . aspirin EC 81 MG tablet Take 81 mg by mouth daily.    . Cholecalciferol 50000 units capsule Take 50,000 Units by mouth 3 (three) times a week.     . DULoxetine (CYMBALTA) 60 MG capsule Take 60 mg by mouth daily.    . folic acid (FOLVITE) 1 MG tablet Take 1 mg by mouth daily.  11  . HYDROcodone-acetaminophen (NORCO) 10-325 MG tablet Take 1 tablet by mouth every 6 (six) hours as needed for moderate pain.     Marland Kitchen loratadine (CLARITIN) 10 MG tablet Take 10 mg by mouth daily.     Marland Kitchen losartan (COZAAR) 100 MG tablet Take 100 mg by mouth daily.    Marland Kitchen LYRICA 25 MG capsule Take 25 mg by mouth 3 (three) times daily.   3  . metoprolol succinate (TOPROL-XL) 50 MG 24 hr tablet TAKE 1 TABLET ER 24HR DAILY ORAL (Patient taking differently: Take 50 mg by mouth daily. ) 30 tablet 3  . nitroGLYCERIN (NITROSTAT) 0.4 MG SL tablet Place 1 tablet (0.4 mg total) under the tongue every 5 (five) minutes x 3 doses as needed for chest pain. (Patient taking differently: Place 0.4 mg under the tongue every 5 (five) minutes as needed for chest pain. Maximum 3 doses) 25 tablet 12  . pantoprazole (PROTONIX) 40 MG tablet Take 40 mg by mouth 2 (two) times daily.     . potassium chloride SA (K-DUR,KLOR-CON) 20 MEQ tablet Take 1 tablet (20 mEq total) by mouth 2 (two) times daily. 20 tablet 0  . triamcinolone cream (KENALOG) 0.1 % Apply 1 application topically 2 (two) times daily as needed for rash.    . benzonatate (TESSALON) 100 MG capsule Take 1 capsule (100 mg total) by  mouth 3 (three) times daily as needed for cough. (Patient not taking: Reported on 03/01/2019) 21 capsule 0  . celecoxib (CELEBREX) 100 MG capsule Take 1 capsule (100 mg total) by mouth 2 (two) times daily. (Patient not taking: Reported on 03/01/2019) 30 capsule 0  . diclofenac sodium (VOLTAREN) 1 % GEL Apply 4 g topically 4 (four) times daily. (Patient not taking: Reported on 03/01/2019) 100 g 0  . famotidine (PEPCID) 20 MG tablet Take 1 tablet (20 mg total) by mouth 2 (two) times daily for 5 days. (Patient not taking: Reported on 03/01/2019) 10 tablet 0  . fluticasone (FLONASE) 50 MCG/ACT nasal spray Place 1 spray into both nostrils daily as needed for allergies.   0  . HYDROcodone-homatropine (HYCODAN) 5-1.5 MG/5ML syrup Take 5 mLs  by mouth every 6 (six) hours as needed for cough. (Patient not taking: Reported on 03/01/2019) 100 mL 0  . lidocaine (LIDODERM) 5 % Place 1 patch onto the skin daily. Remove & Discard patch within 12 hours or as directed by MD (Patient not taking: Reported on 03/01/2019) 30 patch 0  . methocarbamol (ROBAXIN) 500 MG tablet Take 1 tablet (500 mg total) by mouth 2 (two) times daily. (Patient not taking: Reported on 03/01/2019) 20 tablet 0  . oxyCODONE-acetaminophen (PERCOCET) 5-325 MG tablet Take 1-2 tablets by mouth every 4 (four) hours as needed for moderate pain. (Patient not taking: Reported on 03/01/2019) 20 tablet 0    Drug Regimen Review Drug regimen was reviewed and remains appropriate with no significant issues identified  Home: Home Living Family/patient expects to be discharged to:: Private residence Living Arrangements: Spouse/significant other Available Help at Discharge: Family, Available 24 hours/day Type of Home: House Home Access: Stairs to enter [employer] of Steps: 4 Entrance Stairs-Rails: None Home Layout: One level Bathroom Shower/Tub: Chiropodist: Standard Home Equipment: Cane - single point   Functional History: Prior  Function Level of Independence: Independent with assistive device(s) Comments: Reports use of cane for mobility.   Functional Status:  Mobility: Bed Mobility Overal bed mobility: Needs Assistance Bed Mobility: Rolling, Sidelying to Sit Rolling: Min guard Sidelying to sit: Min assist Sit to sidelying: Mod assist General bed mobility comments: Cues for spinal precautions with min assistance to advance LEs and elevate trunk into sitting edge of bed.   Transfers Overall transfer level: Needs assistance Equipment used: Rolling walker (2 wheeled) Transfers: Sit to/from Stand Sit to Stand: Min assist Stand pivot transfers: Mod assist General transfer comment: Pt attempted to stand unassisted but unable to clear her hips to achieve standing.  Require min assistance to boost into standing.   Ambulation/Gait Ambulation/Gait assistance: Min assist Gait Distance (Feet): 12 Feet Assistive device: Rolling walker (2 wheeled) Gait Pattern/deviations: Step-through pattern, Trunk flexed, Antalgic General Gait Details: Slow steps with cues for scapular retraction and upper trunk control.  Pt required cues for sequencing and RW safety.   Gait velocity: very slow cadence Gait velocity interpretation: <1.8 ft/sec, indicate of risk for recurrent falls    ADL: ADL Overall ADL's : Needs assistance/impaired Eating/Feeding: Set up, Sitting Grooming: Set up, Minimal assistance, Sitting Upper Body Bathing: Moderate assistance, Sitting Lower Body Bathing: Maximal assistance, Sit to/from stand Upper Body Dressing : Moderate assistance, Sitting Lower Body Dressing: Maximal assistance, Sit to/from stand Toilet Transfer: Moderate assistance, +2 for safety/equipment, Ambulation, RW(3n1 over toilet) Toileting- Clothing Manipulation and Hygiene: Maximal assistance, Sit to/from stand Tub/ Shower Transfer: Moderate assistance, +2 for safety/equipment, Ambulation, 3 in 1, Rolling walker Functional mobility during  ADLs: Moderate assistance, +2 for safety/equipment, Rolling walker General ADL Comments: Pt completed bed mobility, ambualted to bathroom where she had a syncopal episode, Completed toilet transfer then SPT to recliner and vitals assessed.   Cognition: Cognition Overall Cognitive Status: Within Functional Limits for tasks assessed Orientation Level: Oriented X4 Cognition Arousal/Alertness: Awake/alert Behavior During Therapy: WFL for tasks assessed/performed Overall Cognitive Status: Within Functional Limits for tasks assessed General Comments: pt is very specific about feeling light headed with standing  Physical Exam: Blood pressure (!) 124/56, pulse 84, temperature 98.4 F (36.9 C), temperature source Oral, resp. rate 15, height 4\' 11"  (1.499 m), weight 66.7 kg, SpO2 99 %. Physical Exam  Constitutional: No distress.  Fatigued appearing  HENT:  Head: Normocephalic and atraumatic.  Eyes:  Pupils are equal, round, and reactive to light. EOM are normal.  Neck: Normal range of motion. Tracheal deviation present. No thyromegaly present.  Cardiovascular: Normal rate and regular rhythm.  No murmur heard. Respiratory: Effort normal. No respiratory distress. She has no wheezes.  GI: Soft. She exhibits no distension.  Musculoskeletal:     Comments: LB TTP.   Neurological:  Drowsy, oriented x 3. Follows basic commands. Reasonable insight and awareness. UE 4/5. LE: 3/5 HF, KE and 4/5 ADF/PF.   Skin: Skin is warm. She is not diaphoretic.  Back incision dressed  Psychiatric: She has a normal mood and affect. Her behavior is normal.    Results for orders placed or performed during the hospital encounter of 03/04/19 (from the past 48 hour(s))  CBC     Status: Abnormal   Collection Time: 03/06/19 11:12 AM  Result Value Ref Range   WBC 12.9 (H) 4.0 - 10.5 K/uL   RBC 3.07 (L) 3.87 - 5.11 MIL/uL   Hemoglobin 9.1 (L) 12.0 - 15.0 g/dL   HCT 28.0 (L) 36.0 - 46.0 %   MCV 91.2 80.0 - 100.0 fL    MCH 29.6 26.0 - 34.0 pg   MCHC 32.5 30.0 - 36.0 g/dL   RDW 14.3 11.5 - 15.5 %   Platelets 157 150 - 400 K/uL   nRBC 0.0 0.0 - 0.2 %    Comment: Performed at [facility], [place] [address] [place], [place] [address]  Basic metabolic panel     Status: Abnormal   Collection Time: 03/06/19 11:12 AM  Result Value Ref Range   Sodium 140 135 - 145 mmol/L   Potassium 3.8 3.5 - 5.1 mmol/L   Chloride 108 98 - 111 mmol/L   CO2 24 22 - 32 mmol/L   Glucose, Bld 99 70 - 99 mg/dL   BUN 9 6 - 20 mg/dL   Creatinine, Ser 0.65 0.44 - 1.00 mg/dL   Calcium 7.7 (L) 8.9 - 10.3 mg/dL   GFR calc non Af Amer >60 >60 mL/min   GFR calc Af Amer >60 >60 mL/min   Anion gap 8 5 - 15    Comment: Performed at [facility], [place] [address] [place], [place] [address]   No results found.     Medical Problem List and Plan: 1.  Decreased functional mobility secondary to lumbar stenosis with radiculopathy.  Status post L4- L5-S1 decompression posterior lumbar interbody fusion 03/04/2019.  Back brace when out of bed  -admit to inpatient rehab 2.  Antithrombotics: -DVT/anticoagulation: SCDs.  Check vascular study  -antiplatelet therapy: Aspirin 81 mg daily 3. Pain Management: Cymbalta 30 mg daily, Lyrica 25 mg 3 times daily, hydrocodone as needed.   -having ongoing radicular pain. May need further titration of lyrica 4. Mood: Provide emotional support  -antipsychotic agents: N/A 5. Neuropsych: This patient is capable of making decisions on her own behalf. 6. Skin/Wound Care: Routine skin checks 7. Fluids/Electrolytes/Nutrition: Routine in and outs with follow-up chemistries 8.  Acute blood loss anemia.  Follow-up CBC 9.  Transient orthostasis improved.  Norvasc 5 mg daily, midamor 5 mg daily, Cozaar 100 mg daily, Toprol-XL 50 mg daily.  Monitor with increased mobility 10.  Urinary retention.  Improving.  Routine toileting schedule  -check PVR's x 3, I/C as needed 11.  Constipation.  Laxative assistance        Cathlyn Parsons, PA-C 03/08/2019

## 2019-03-08 NOTE — H&P (Signed)
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  Presented 03/04/2019 with chronic intractable back pain rating to lower extremities failing all conservative management.  Work-up demonstrated evidence of marked disc degeneration with disc space collapse and significant foraminal stenosis with radiculopathy at L4-5 and L5-S1.  Underwent bilateral L4-5, L5-S1 decompressive laminotomies and foraminotomies with posterior lumbar interbody fusion 03/04/2019 per Dr. Annette Stable.  Hospital course pain management.  Back brace when out of bed applied in sitting position.  Acute blood loss anemia 9.1.  Patient with intermittent bouts of orthostatic hypotension transient likely multifactorial and monitored that has improved.  She did initially receive some IV fluids.  Postoperative urinary retention improved with routine toileting schedule.  Therapy evaluations completed patient was admitted for a comprehensive rehab program.   Review of Systems  Constitutional: Negative for chills and fever.  HENT: Negative for hearing loss.   Eyes: Negative for blurred vision and double vision.  Respiratory: Negative for cough and shortness of breath.   Cardiovascular: Positive for leg swelling. Negative for chest pain and palpitations.  Gastrointestinal: Positive for constipation. Negative for heartburn, nausea and vomiting.       GERD  Genitourinary: Negative for flank pain and hematuria.  Musculoskeletal: Positive for back pain and myalgias.  Skin: Negative for rash.  Neurological: Positive for dizziness.  All other systems reviewed and are negative.       Past Medical History:  Diagnosis Date  . Arthritis    . Calculus of gallbladder without mention of cholecystitis or obstruction    . Dizziness    . Fibromyalgia     . GERD (gastroesophageal reflux disease)    . Goiter    . Hypercholesteremia    . Hypertension    . Lower back pain    . Migraine    . Nontoxic uninodular goiter      sees dr vollmer at [facility]  . Obesity    . PONV (postoperative nausea and vomiting)    . Sleep apnea      STOPBANG=5  . Stroke [facility]) 2000         Past Surgical History:  Procedure Laterality Date  . ABDOMINAL HYSTERECTOMY      . BOWEL RESECTION N/A 03/06/2018    Procedure: SMALL BOWEL ANASTAMOSIS;  Surgeon: Clovis Riley, MD;  Location: [place];  Service: General;  Laterality: N/A;  . CATARACT EXTRACTION W/ INTRAOCULAR LENS IMPLANT Bilateral    . CESAREAN SECTION   yrs ago    done x 2  . CHOLECYSTECTOMY   01/05/2012    Procedure: LAPAROSCOPIC CHOLECYSTECTOMY WITH INTRAOPERATIVE CHOLANGIOGRAM;  Surgeon: Pedro Earls, MD;  Location: [facility];  Service: General;  Laterality: N/A;  . COLONOSCOPY   10/08/2012    Procedure: COLONOSCOPY;  Surgeon: Beryle Beams, MD;  Location: [facility];  Service: Endoscopy;  Laterality: N/A;  . KNEE ARTHROSCOPY   one 1995 and 1 in 1997    both knees done  . LAPAROSCOPY N/A 03/06/2018    Procedure: LAPAROSCOPY DIAGNOSTIC WITH LYSIS OF ADHESIONS;  Surgeon: Clovis Riley, MD;  Location: [place];  Service: General;  Laterality: N/A;  . LAPAROTOMY N/A 03/06/2018    Procedure: EXPLORATORY LAPAROTOMY, RESECTION OF DISTAL ROUX, CLOSURE OF INTERNAL HERNIA;  Surgeon: Clovis Riley, MD;  Location: [place];  Service:  General;  Laterality: N/A;  . POSTERIOR LUMBAR FUSION   03/04/2019  . surgery for endometriosis   yrs ago  . thryoid biopsy   December 01, 2011     at [facility]         Family History  Problem Relation Age of Onset  . Diabetes Mother    . Hypertension Mother    . Transient ischemic attack Mother    . Seizures Mother    . Dementia Mother    . Other Father          MVA  . Cancer Brother          colon and lung  . Cancer Maternal Grandmother          colon    Social History:   reports that she has never smoked. She has never used smokeless tobacco. She reports previous alcohol use. She reports that she does not use drugs. Allergies:       Allergies  Allergen Reactions  . Shrimp [Shellfish Allergy] Anaphylaxis  . Naproxen Other (See Comments)      Makes stomach cramp and burning badly.          Medications Prior to Admission  Medication Sig Dispense Refill  . aMILoride (MIDAMOR) 5 MG tablet Take 5 mg by mouth daily.      Marland Kitchen amLODipine (NORVASC) 5 MG tablet Take 1 tablet (5 mg total) by mouth daily. 30 tablet 1  . aspirin EC 81 MG tablet Take 81 mg by mouth daily.      . Cholecalciferol 50000 units capsule Take 50,000 Units by mouth 3 (three) times a week.       . DULoxetine (CYMBALTA) 60 MG capsule Take 60 mg by mouth daily.      . folic acid (FOLVITE) 1 MG tablet Take 1 mg by mouth daily.   11  . HYDROcodone-acetaminophen (NORCO) 10-325 MG tablet Take 1 tablet by mouth every 6 (six) hours as needed for moderate pain.       Marland Kitchen loratadine (CLARITIN) 10 MG tablet Take 10 mg by mouth daily.       Marland Kitchen losartan (COZAAR) 100 MG tablet Take 100 mg by mouth daily.      Marland Kitchen LYRICA 25 MG capsule Take 25 mg by mouth 3 (three) times daily.    3  . metoprolol succinate (TOPROL-XL) 50 MG 24 hr tablet TAKE 1 TABLET ER 24HR DAILY ORAL (Patient taking differently: Take 50 mg by mouth daily. ) 30 tablet 3  . nitroGLYCERIN (NITROSTAT) 0.4 MG SL tablet Place 1 tablet (0.4 mg total) under the tongue every 5 (five) minutes x 3 doses as needed for chest pain. (Patient taking differently: Place 0.4 mg under the tongue every 5 (five) minutes as needed for chest pain. Maximum 3 doses) 25 tablet 12  . pantoprazole (PROTONIX) 40 MG tablet Take 40 mg by mouth 2 (two) times daily.       . potassium chloride SA (K-DUR,KLOR-CON) 20 MEQ tablet Take 1 tablet (20 mEq total) by mouth 2 (two) times daily. 20 tablet 0  . triamcinolone cream (KENALOG) 0.1 % Apply 1 application topically 2 (two) times daily  as needed for rash.      . benzonatate (TESSALON) 100 MG capsule Take 1 capsule (100 mg total) by mouth 3 (three) times daily as needed for cough. (Patient not taking: Reported on 03/01/2019) 21 capsule 0  . celecoxib (CELEBREX) 100 MG capsule Take 1 capsule (100 mg total) by mouth  2 (two) times daily. (Patient not taking: Reported on 03/01/2019) 30 capsule 0  . diclofenac sodium (VOLTAREN) 1 % GEL Apply 4 g topically 4 (four) times daily. (Patient not taking: Reported on 03/01/2019) 100 g 0  . famotidine (PEPCID) 20 MG tablet Take 1 tablet (20 mg total) by mouth 2 (two) times daily for 5 days. (Patient not taking: Reported on 03/01/2019) 10 tablet 0  . fluticasone (FLONASE) 50 MCG/ACT nasal spray Place 1 spray into both nostrils daily as needed for allergies.    0  . HYDROcodone-homatropine (HYCODAN) 5-1.5 MG/5ML syrup Take 5 mLs by mouth every 6 (six) hours as needed for cough. (Patient not taking: Reported on 03/01/2019) 100 mL 0  . lidocaine (LIDODERM) 5 % Place 1 patch onto the skin daily. Remove & Discard patch within 12 hours or as directed by MD (Patient not taking: Reported on 03/01/2019) 30 patch 0  . methocarbamol (ROBAXIN) 500 MG tablet Take 1 tablet (500 mg total) by mouth 2 (two) times daily. (Patient not taking: Reported on 03/01/2019) 20 tablet 0  . oxyCODONE-acetaminophen (PERCOCET) 5-325 MG tablet Take 1-2 tablets by mouth every 4 (four) hours as needed for moderate pain. (Patient not taking: Reported on 03/01/2019) 20 tablet 0      Drug Regimen Review Drug regimen was reviewed and remains appropriate with no significant issues identified   Home: Home Living Family/patient expects to be discharged to:: Private residence Living Arrangements: Spouse/significant other Available Help at Discharge: Family, Available 24 hours/day Type of Home: House Home Access: Stairs to enter [employer] of Steps: 4 Entrance Stairs-Rails: None Home Layout: One level Bathroom Shower/Tub: Scientist, forensic: Standard Home Equipment: Cane - single point   Functional History: Prior Function Level of Independence: Independent with assistive device(s) Comments: Reports use of cane for mobility.    Functional Status:  Mobility: Bed Mobility Overal bed mobility: Needs Assistance Bed Mobility: Rolling, Sidelying to Sit Rolling: Min guard Sidelying to sit: Min assist Sit to sidelying: Mod assist General bed mobility comments: Cues for spinal precautions with min assistance to advance LEs and elevate trunk into sitting edge of bed.   Transfers Overall transfer level: Needs assistance Equipment used: Rolling walker (2 wheeled) Transfers: Sit to/from Stand Sit to Stand: Min assist Stand pivot transfers: Mod assist General transfer comment: Pt attempted to stand unassisted but unable to clear her hips to achieve standing.  Require min assistance to boost into standing.   Ambulation/Gait Ambulation/Gait assistance: Min assist Gait Distance (Feet): 12 Feet Assistive device: Rolling walker (2 wheeled) Gait Pattern/deviations: Step-through pattern, Trunk flexed, Antalgic General Gait Details: Slow steps with cues for scapular retraction and upper trunk control.  Pt required cues for sequencing and RW safety.   Gait velocity: very slow cadence Gait velocity interpretation: <1.8 ft/sec, indicate of risk for recurrent falls   ADL: ADL Overall ADL's : Needs assistance/impaired Eating/Feeding: Set up, Sitting Grooming: Set up, Minimal assistance, Sitting Upper Body Bathing: Moderate assistance, Sitting Lower Body Bathing: Maximal assistance, Sit to/from stand Upper Body Dressing : Moderate assistance, Sitting Lower Body Dressing: Maximal assistance, Sit to/from stand Toilet Transfer: Moderate assistance, +2 for safety/equipment, Ambulation, RW(3n1 over toilet) Toileting- Clothing Manipulation and Hygiene: Maximal assistance, Sit to/from stand Tub/ Shower Transfer: Moderate  assistance, +2 for safety/equipment, Ambulation, 3 in 1, Rolling walker Functional mobility during ADLs: Moderate assistance, +2 for safety/equipment, Rolling walker General ADL Comments: Pt completed bed mobility, ambualted to bathroom where she had a syncopal episode, Completed toilet  transfer then SPT to recliner and vitals assessed.    Cognition: Cognition Overall Cognitive Status: Within Functional Limits for tasks assessed Orientation Level: Oriented X4 Cognition Arousal/Alertness: Awake/alert Behavior During Therapy: WFL for tasks assessed/performed Overall Cognitive Status: Within Functional Limits for tasks assessed General Comments: pt is very specific about feeling light headed with standing   Physical Exam: Blood pressure (!) 124/56, pulse 84, temperature 98.4 F (36.9 C), temperature source Oral, resp. rate 15, height 4\' 11"  (1.499 m), weight 66.7 kg, SpO2 99 %. Physical Exam  Constitutional: No distress.  Fatigued appearing  HENT:  Head: Normocephalic and atraumatic.  Eyes: Pupils are equal, round, and reactive to light. EOM are normal.  Neck: Normal range of motion. Tracheal deviation present. No thyromegaly present.  Cardiovascular: Normal rate and regular rhythm.  No murmur heard. Respiratory: Effort normal. No respiratory distress. She has no wheezes.  GI: Soft. She exhibits no distension.  Musculoskeletal:     Comments: LB TTP.   Neurological:  Drowsy, oriented x 3. Follows basic commands. Reasonable insight and awareness. UE 4/5. LE: 3/5 HF, KE and 4/5 ADF/PF.   Skin: Skin is warm. She is not diaphoretic.  Back incision dressed  Psychiatric: She has a normal mood and affect. Her behavior is normal.      Lab Results Last 48 Hours        Results for orders placed or performed during the hospital encounter of 03/04/19 (from the past 48 hour(s))  CBC     Status: Abnormal    Collection Time: 03/06/19 11:12 AM  Result Value Ref Range    WBC 12.9 (H) 4.0 - 10.5  K/uL    RBC 3.07 (L) 3.87 - 5.11 MIL/uL    Hemoglobin 9.1 (L) 12.0 - 15.0 g/dL    HCT 28.0 (L) 36.0 - 46.0 %    MCV 91.2 80.0 - 100.0 fL    MCH 29.6 26.0 - 34.0 pg    MCHC 32.5 30.0 - 36.0 g/dL    RDW 14.3 11.5 - 15.5 %    Platelets 157 150 - 400 K/uL    nRBC 0.0 0.0 - 0.2 %      Comment: Performed at [facility], [place] [address], [place], [place] [address]  Basic metabolic panel     Status: Abnormal    Collection Time: 03/06/19 11:12 AM  Result Value Ref Range    Sodium 140 135 - 145 mmol/L    Potassium 3.8 3.5 - 5.1 mmol/L    Chloride 108 98 - 111 mmol/L    CO2 24 22 - 32 mmol/L    Glucose, Bld 99 70 - 99 mg/dL    BUN 9 6 - 20 mg/dL    Creatinine, Ser 0.65 0.44 - 1.00 mg/dL    Calcium 7.7 (L) 8.9 - 10.3 mg/dL    GFR calc non Af Amer >60 >60 mL/min    GFR calc Af Amer >60 >60 mL/min    Anion gap 8 5 - 15      Comment: Performed at [facility], [place] [address] [place], [place] [address]      Imaging Results (Last 48 hours)  No results found.           Medical Problem List and Plan: 1.  Decreased functional mobility secondary to lumbar stenosis with radiculopathy.  Status post L4- L5-S1 decompression posterior lumbar interbody fusion 03/04/2019.  Back brace when out of bed             -  admit to inpatient rehab 2.  Antithrombotics: -DVT/anticoagulation: SCDs.  Check vascular study             -antiplatelet therapy: Aspirin 81 mg daily 3. Pain Management: Cymbalta 30 mg daily, Lyrica 25 mg 3 times daily, hydrocodone as needed.              -having ongoing radicular pain. May need further titration of lyrica 4. Mood: Provide emotional support             -antipsychotic agents: N/A 5. Neuropsych: This patient is capable of making decisions on her own behalf. 6. Skin/Wound Care: Routine skin checks 7. Fluids/Electrolytes/Nutrition: Routine in and outs with follow-up chemistries 8.  Acute blood loss anemia.  Follow-up CBC 9.  Transient orthostasis improved.   Norvasc 5 mg daily, midamor 5 mg daily, Cozaar 100 mg daily, Toprol-XL 50 mg daily.  Monitor with increased mobility 10.  Urinary retention.  Improving.  Routine toileting schedule             -check PVR's x 3, I/C as needed 11.  Constipation.  Laxative assistance    Post Admission Physician Evaluation: 1. Functional deficits secondary  to lumbar stenosis with radiculopathy. 2. Patient is admitted to receive collaborative, interdisciplinary care between the physiatrist, rehab nursing staff, and therapy team. 3. Patient's level of medical complexity and substantial therapy needs in context of that medical necessity cannot be provided at a lesser intensity of care such as a SNF. 4. Patient has experienced substantial functional loss from his/her baseline which was documented above under the "Functional History" and "Functional Status" headings.  Judging by the patient's diagnosis, physical exam, and functional history, the patient has potential for functional progress which will result in measurable gains while on inpatient rehab.  These gains will be of substantial and practical use upon discharge  in facilitating mobility and self-care at the household level. 5. Physiatrist will provide 24 hour management of medical needs as well as oversight of the therapy plan/treatment and provide guidance as appropriate regarding the interaction of the two. 6. The Preadmission Screening has been reviewed and patient status is unchanged unless otherwise stated above. 7. 24 hour rehab nursing will assist with bladder management, bowel management, safety, skin/wound care, disease management, pain management and patient education  and help integrate therapy concepts, techniques,education, etc. 8. PT will assess and treat for/with: Lower extremity strength, range of motion, stamina, balance, functional mobility, safety, adaptive techniques and equipment.   Goals are: mod I. 9. OT will assess and treat for/with: ADL's,  functional mobility, safety, upper extremity strength, adaptive techniques and equipment .   Goals are: mod I. Therapy may proceed with showering this patient. 10. SLP will assess and treat for/with: n/a.  Goals are: n/a. 11. Case Management and Social Worker will assess and treat for psychological issues and discharge planning. 12. Team conference will be held weekly to assess progress toward goals and to determine barriers to discharge. 13. Patient will receive at least 3 hours of therapy per day at least 5 days per week. 14. ELOS: 7-10 days       15. Prognosis:  excellent    I have personally performed a face to face diagnostic evaluation of this patient and formulated the key components of the plan.  Additionally, I have personally reviewed laboratory data, imaging studies, as well as relevant notes and concur with the physician assistant's documentation above.  Meredith Staggers, MD, Mellody Drown  Lavon Paganini Angiulli, PA-C 03/08/2019

## 2019-03-08 NOTE — Progress Notes (Signed)
Occupational Therapy Treatment Patient Details Name: Jodi Bowman MRN: [id number] DOB: 04-Aug-1961 Today's Date: 03/08/2019    History of present illness Pt is a 58 y/o female s/p L4-S1 PLIF. PMH includes fibromyalgia, HTN, migraines, sleep apnea, and CVA.    OT comments  Pt progressing towards acute OT goals. Currently min to mod A with UB/LB ADLs and min A for short distance functional mobility. Lightheaded once EOB. Updated d/c recommendation to CIR.    Follow Up Recommendations  CIR    Equipment Recommendations  Other (comment)(defer to next venue)    Recommendations for Other Services      Precautions / Restrictions Precautions Precautions: Back;Fall Precaution Booklet Issued: Yes (comment) Precaution Comments: Pt able to recall BLT back precautions Required Braces or Orthoses: Spinal Brace Spinal Brace: Lumbar corset;Applied in sitting position Restrictions Weight Bearing Restrictions: No       Mobility Bed Mobility Overal bed mobility: Needs Assistance Bed Mobility: Supine to Sit     Supine to sit: Min guard;HOB elevated     General bed mobility comments: has adjustable bed at home. Cued for back precautions (to avoid twisting) during supine>EOB. Pt stated this is her preferred way to get out of bed.   Transfers Overall transfer level: Needs assistance Equipment used: Rolling walker (2 wheeled) Transfers: Sit to/from Stand Sit to Stand: Min assist         General transfer comment: from EOB to recliner. steadying assist, assist to control descent    Balance Overall balance assessment: Needs assistance Sitting-balance support: Feet supported;Bilateral upper extremity supported Sitting balance-Leahy Scale: Fair     Standing balance support: Bilateral upper extremity supported;During functional activity Standing balance-Leahy Scale: Poor Standing balance comment: Reliant on BUE support                            ADL either performed or  assessed with clinical judgement   ADL Overall ADL's : Needs assistance/impaired                         Toilet Transfer: Minimal assistance;Stand-pivot;Ambulation;RW Toilet Transfer Details (indicate cue type and reason): walked bed to recliner this session, simulated toilet transfer. Has been using BSC with nursing due to lightheaded/orthistatic         Functional mobility during ADLs: Minimal assistance;Rolling walker General ADL Comments: Pt completed bed mobility, walked to recliner. Continued education on back precautions and ADL strategies.     Vision       Perception     Praxis      Cognition Arousal/Alertness: Awake/alert Behavior During Therapy: WFL for tasks assessed/performed Overall Cognitive Status: Within Functional Limits for tasks assessed                                          Exercises     Shoulder Instructions       General Comments Sitting up in recliner at end of session    Pertinent Vitals/ Pain       Pain Assessment: 0-10 Pain Score: 4  Pain Location: low back Pain Descriptors / Indicators: Operative site guarding Pain Intervention(s): Monitored during session;Limited activity within patient's tolerance;Repositioned  Home Living   Living Arrangements: Spouse/significant other;Children(and 48 year old daughter; has 8 children) Available Help at Discharge: (Spouse works 10 am until 7 pm; 12  year old at home)                     Bathroom Accessibility: Yes How Accessible: Accessible via walker        Lives With: Spouse;Daughter(28 year old daughter)    Prior Functioning/Environment              Frequency  Min 2X/week        Progress Toward Goals  OT Goals(current goals can now be found in the care plan section)  Progress towards OT goals: Progressing toward goals  Acute Rehab OT Goals Patient Stated Goal: to get stronger OT Goal Formulation: With patient Time For Goal Achievement:  03/19/19 Potential to Achieve Goals: Good ADL Goals Pt Will Perform Grooming: with set-up;sitting Pt Will Perform Upper Body Bathing: with set-up;sitting Pt Will Perform Lower Body Bathing: sit to/from stand;with min guard assist Pt Will Perform Upper Body Dressing: with set-up;sitting Pt Will Perform Lower Body Dressing: with min guard assist;sit to/from stand Pt Will Transfer to Toilet: with min guard assist;ambulating Pt Will Perform Toileting - Clothing Manipulation and hygiene: with min guard assist;with set-up;sitting/lateral leans;sit to/from stand Pt Will Perform Tub/Shower Transfer: with min guard assist;ambulating;3 in 1;rolling walker Additional ADL Goal #1: Pt will complete bed mobility at supervision level to prepare for OOB ADLs.  Plan Discharge plan needs to be updated    Co-evaluation                 AM-PAC OT "6 Clicks" Daily Activity     Outcome Measure   Help from another person eating meals?: None Help from another person taking care of personal grooming?: A Little Help from another person toileting, which includes using toliet, bedpan, or urinal?: A Lot Help from another person bathing (including washing, rinsing, drying)?: A Lot Help from another person to put on and taking off regular upper body clothing?: A Lot Help from another person to put on and taking off regular lower body clothing?: A Lot 6 Click Score: 15    End of Session Equipment Utilized During Treatment: Rolling walker;Back brace  OT Visit Diagnosis: Unsteadiness on feet (R26.81);Muscle weakness (generalized) (M62.81);Pain   Activity Tolerance Patient tolerated treatment well;Other (comment)(some lightheadedness)   Patient Left in chair;with call bell/phone within reach   Nurse Communication          Time: 0900-0919 OT Time Calculation (min): 19 min  Charges: OT General Charges $OT Visit: 1 Visit OT Treatments $Self Care/Home Management : 8-22 mins  Tyrone Schimke,  OT Acute Rehabilitation Services Pager: [phone] Office: [phone]    Hortencia Pilar 03/08/2019, 1:44 PM

## 2019-03-09 ENCOUNTER — Inpatient Hospital Stay (HOSPITAL_COMMUNITY): Payer: Medicare Other | Admitting: Occupational Therapy

## 2019-03-09 ENCOUNTER — Inpatient Hospital Stay (HOSPITAL_COMMUNITY): Payer: Medicare Other | Admitting: Physical Therapy

## 2019-03-09 ENCOUNTER — Inpatient Hospital Stay (HOSPITAL_COMMUNITY): Payer: Medicare Other

## 2019-03-09 DIAGNOSIS — Z981 Arthrodesis status: Secondary | ICD-10-CM

## 2019-03-09 DIAGNOSIS — M48062 Spinal stenosis, lumbar region with neurogenic claudication: Secondary | ICD-10-CM

## 2019-03-09 DIAGNOSIS — M7989 Other specified soft tissue disorders: Secondary | ICD-10-CM

## 2019-03-09 LAB — CBC WITH DIFFERENTIAL/PLATELET
Abs Immature Granulocytes: 0.02 10*3/uL (ref 0.00–0.07)
Basophils Absolute: 0 10*3/uL (ref 0.0–0.1)
Basophils Relative: 0 %
Eosinophils Absolute: 0.1 10*3/uL (ref 0.0–0.5)
Eosinophils Relative: 1 %
HCT: 28.3 % — ABNORMAL LOW (ref 36.0–46.0)
Hemoglobin: 9.1 g/dL — ABNORMAL LOW (ref 12.0–15.0)
Immature Granulocytes: 0 %
Lymphocytes Relative: 40 %
Lymphs Abs: 3.4 10*3/uL (ref 0.7–4.0)
MCH: 29.4 pg (ref 26.0–34.0)
MCHC: 32.2 g/dL (ref 30.0–36.0)
MCV: 91.6 fL (ref 80.0–100.0)
Monocytes Absolute: 0.8 10*3/uL (ref 0.1–1.0)
Monocytes Relative: 9 %
Neutro Abs: 4.2 10*3/uL (ref 1.7–7.7)
Neutrophils Relative %: 50 %
Platelets: 212 10*3/uL (ref 150–400)
RBC: 3.09 MIL/uL — ABNORMAL LOW (ref 3.87–5.11)
RDW: 13.4 % (ref 11.5–15.5)
WBC: 8.4 10*3/uL (ref 4.0–10.5)
nRBC: 0 % (ref 0.0–0.2)

## 2019-03-09 LAB — COMPREHENSIVE METABOLIC PANEL
ALT: 18 U/L (ref 0–44)
AST: 18 U/L (ref 15–41)
Albumin: 2.2 g/dL — ABNORMAL LOW (ref 3.5–5.0)
Alkaline Phosphatase: 66 U/L (ref 38–126)
Anion gap: 6 (ref 5–15)
BUN: 8 mg/dL (ref 6–20)
CO2: 29 mmol/L (ref 22–32)
Calcium: 8.7 mg/dL — ABNORMAL LOW (ref 8.9–10.3)
Chloride: 104 mmol/L (ref 98–111)
Creatinine, Ser: 0.72 mg/dL (ref 0.44–1.00)
GFR calc Af Amer: >60 mL/min (ref >60)
GFR calc non Af Amer: >60 mL/min (ref >60)
Glucose, Bld: 88 mg/dL (ref 70–99)
Potassium: 4.1 mmol/L (ref 3.5–5.1)
Sodium: 139 mmol/L (ref 135–145)
Total Bilirubin: 0.5 mg/dL (ref 0.3–1.2)
Total Protein: 4.9 g/dL — ABNORMAL LOW (ref 6.5–8.1)

## 2019-03-09 MED ORDER — PREGABALIN 25 MG PO CAPS
50.0000 mg | ORAL_CAPSULE | Freq: Three times a day (TID) | ORAL | Status: DC
  Administered 2019-03-09 – 2019-03-10 (×5): 50 mg via ORAL
  Filled 2019-03-09 (×5): qty 2

## 2019-03-09 NOTE — Patient Care Conference (Signed)
Inpatient RehabilitationTeam Conference and Plan of Care Update Date: 03/09/2019   Time: 10:25 Am    Patient Name: Jodi Bowman      Medical Record Number: [id number]  Date of Birth: Mar 16, 1961 Sex: Female         Room/Bed: 5N16C/5N16C-01 Payor Info: Payor: MEDICARE / Plan: MEDICARE PART A AND B / Product Type: *No Product type* /    Admitting Diagnosis: Gen Team  Other Neuro, lumbar radiculopathy  Admit Date/Time:  03/08/2019  1:00 PM Admission Comments: No comment available   Primary Diagnosis:  <principal problem not specified> Principal Problem: <principal problem not specified>  Patient Active Problem List   Diagnosis Date Noted  . Lumbar radiculopathy 03/08/2019  . Orthostatic hypotension 03/06/2019  . Postoperative urinary retention 03/06/2019  . Lumbar foraminal stenosis 03/04/2019  . S/P exploratory laparotomy 03/06/2018  . Bowel obstruction ([place]) 03/06/2018  . Chest pain, rule out acute myocardial infarction 12/17/2017  . Hypokalemia 12/17/2017  . Acute lower UTI 12/17/2017  . Vertigo 04/30/2017  . Small vessel disease, cerebrovascular 04/30/2017  . Chronic low back pain 08/01/2015  . Right hip pain 08/01/2015  . Abnormality of gait 08/01/2015  . Left leg weakness   . Low back pain with radiation   . Syncope 07/30/2014  . Atypical chest pain 03/12/2014  . Lap chole [facility] April 2013 01/29/2012  . Gallstones 12/04/2011  . Thyroid nodule-non neoplastic goiter by needle aspiration 12/04/2011  . GLUCOSE INTOLERANCE 03/12/2010  . DYSLIPIDEMIA 03/12/2010  . Chronic migraine 03/12/2010  . Carotid stenosis 03/12/2010  . CEREBROVASCULAR ACCIDENT 03/12/2010  . LIPOMA 01/29/2010  . HEADACHE 01/29/2010  . ANKLE INJURY, RIGHT 04/12/2009  . PHARYNGITIS 03/21/2009  . Backache 03/01/2009  . ALLERGIC RHINITIS 03/17/2007  . LOW BACK PAIN 03/17/2007  . Essential hypertension 01/01/2007  . ANXIETY STATE NOS 09/11/2005    Expected Discharge Date: Expected Discharge Date:  (TBD)  Team Members Present: Physician leading conference: Dr. Alysia Penna Social Worker Present: Ovidio Kin, LCSW Nurse Present: Rosita Fire, LPN PT Present: Roderic Ovens, PT OT Present: Elisabeth Most, OT SLP Present: Weston Anna, SLP PPS Coordinator present : Gunnar Fusi     Current Status/Progress Goal Weekly Team Focus  Medical     new evaluation-medical stability   Adjusting medications for pain and BP     Bowel/Bladder   Con of B/B  To remain con of B/B      Swallow/Nutrition/ Hydration             ADL's   CGA/Min a for functional transfers and adl  mod I  pain management, ADL training, functional transfers, HM with RW   Mobility   eval pending  eval pending  eval pending   Communication             Safety/Cognition/ Behavioral Observations            Pain   C/o pain in back and also c/o muscle cramps.  To lower the amount of pain and cramps.       Skin   No skin issues at this time  Remain free from skin break down.          *See Care Plan and progress notes for long and short-term goals.     Barriers to Discharge  Current Status/Progress Possible Resolutions Date Resolved   Physician                    Nursing  PT  Decreased caregiver support                 OT                  SLP                SW                Discharge Planning/Teaching Needs:    Home with husband and 85 yo daughter. Husband works 10-7 pm. Many children-8-total. Hopefully can be mod/i by DC     Team Discussion:  New eval setting goals in therapies today. Pain issues-MD aware and is working on this.   Revisions to Treatment Plan:  New eval        I attest that I was present, lead the team conference, and concur with the assessment and plan of the team. Teleconference held due to COVID 19   Arik Husmann, [place] Rhyme 03/10/2019, 8:43 AM

## 2019-03-09 NOTE — Evaluation (Signed)
Occupational Therapy Assessment and Plan  Patient Details  Name: Jodi Bowman MRN: [id number] Date of Birth: 21-Apr-1961  OT Diagnosis: lumbago (low back pain) and muscle weakness (generalized) Rehab Potential: Rehab Potential (ACUTE ONLY): Good ELOS: one week   Today's Date: 03/09/2019 OT Individual Time: 0071-2197 OT Individual Time Calculation (min): 75 min     Problem List:  Patient Active Problem List   Diagnosis Date Noted  . Lumbar radiculopathy 03/08/2019  . Orthostatic hypotension 03/06/2019  . Postoperative urinary retention 03/06/2019  . Lumbar foraminal stenosis 03/04/2019  . S/P exploratory laparotomy 03/06/2018  . Bowel obstruction ([place]) 03/06/2018  . Chest pain, rule out acute myocardial infarction 12/17/2017  . Hypokalemia 12/17/2017  . Acute lower UTI 12/17/2017  . Vertigo 04/30/2017  . Small vessel disease, cerebrovascular 04/30/2017  . Chronic low back pain 08/01/2015  . Right hip pain 08/01/2015  . Abnormality of gait 08/01/2015  . Left leg weakness   . Low back pain with radiation   . Syncope 07/30/2014  . Atypical chest pain 03/12/2014  . Lap chole [facility] April 2013 01/29/2012  . Gallstones 12/04/2011  . Thyroid nodule-non neoplastic goiter by needle aspiration 12/04/2011  . GLUCOSE INTOLERANCE 03/12/2010  . DYSLIPIDEMIA 03/12/2010  . Chronic migraine 03/12/2010  . Carotid stenosis 03/12/2010  . CEREBROVASCULAR ACCIDENT 03/12/2010  . LIPOMA 01/29/2010  . HEADACHE 01/29/2010  . ANKLE INJURY, RIGHT 04/12/2009  . PHARYNGITIS 03/21/2009  . Backache 03/01/2009  . ALLERGIC RHINITIS 03/17/2007  . LOW BACK PAIN 03/17/2007  . Essential hypertension 01/01/2007  . ANXIETY STATE NOS 09/11/2005    Past Medical History:  Past Medical History:  Diagnosis Date  . Arthritis   . Calculus of gallbladder without mention of cholecystitis or obstruction   . Dizziness   . Fibromyalgia   . GERD (gastroesophageal reflux disease)   . Goiter   .  Hypercholesteremia   . Hypertension   . Lower back pain   . Migraine   . Nontoxic uninodular goiter    sees dr vollmer at [facility]  . Obesity   . PONV (postoperative nausea and vomiting)   . Sleep apnea    STOPBANG=5  . Stroke [facility]) 2000   Past Surgical History:  Past Surgical History:  Procedure Laterality Date  . ABDOMINAL HYSTERECTOMY    . BOWEL RESECTION N/A 03/06/2018   Procedure: SMALL BOWEL ANASTAMOSIS;  Surgeon: Clovis Riley, MD;  Location: [place];  Service: General;  Laterality: N/A;  . CATARACT EXTRACTION W/ INTRAOCULAR LENS IMPLANT Bilateral   . CESAREAN SECTION  yrs ago   done x 2  . CHOLECYSTECTOMY  01/05/2012   Procedure: LAPAROSCOPIC CHOLECYSTECTOMY WITH INTRAOPERATIVE CHOLANGIOGRAM;  Surgeon: Pedro Earls, MD;  Location: [facility];  Service: General;  Laterality: N/A;  . COLONOSCOPY  10/08/2012   Procedure: COLONOSCOPY;  Surgeon: Beryle Beams, MD;  Location: [facility];  Service: Endoscopy;  Laterality: N/A;  . KNEE ARTHROSCOPY  one 1995 and 1 in 1997   both knees done  . LAPAROSCOPY N/A 03/06/2018   Procedure: LAPAROSCOPY DIAGNOSTIC WITH LYSIS OF ADHESIONS;  Surgeon: Clovis Riley, MD;  Location: [place];  Service: General;  Laterality: N/A;  . LAPAROTOMY N/A 03/06/2018   Procedure: EXPLORATORY LAPAROTOMY, RESECTION OF DISTAL ROUX, CLOSURE OF INTERNAL HERNIA;  Surgeon: Clovis Riley, MD;  Location: [place];  Service: General;  Laterality: N/A;  . POSTERIOR LUMBAR FUSION  03/04/2019  . surgery for endometriosis  yrs ago  . thryoid biopsy  December 01, 2011    at [facility]    Assessment & Plan Clinical Impression: Patient is a 58 y.o. year old female with history of hypertension, hyperlipidemia, fibromyalgia, 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 Presented 03/04/2019 with chronic intractable back pain rating to lower extremities failing all conservative management. Work-up demonstrated evidence of marked disc degeneration with disc space collapse and  significant foraminal stenosis with radiculopathy at L4-5 and L5-S1. Underwent bilateral L4-5, L5-S1 decompressive laminotomies and foraminotomies with posterior lumbar interbody fusion 03/04/2019 per Dr. Annette Stable. Hospital course pain management. Back brace when out of bed applied in sitting position. Acute blood loss anemia 9.1. Patient with intermittent bouts of orthostatic hypotension transient likely multifactorial and monitored that has improved. She did initially receive some IV fluids. Postoperative urinary retention improved with routine toileting schedule.  Patient transferred to CIR on 03/08/2019 .    Patient currently requires min with basic self-care skills secondary to muscle weakness and decreased standing balance and decreased postural control.  Prior to hospitalization, patient could complete adl with independent .  Patient will benefit from skilled intervention to decrease level of assist with basic self-care skills prior to discharge home with care partner.  Anticipate patient will require intermittent supervision and no further OT follow recommended.  OT - End of Session Activity Tolerance: Tolerates 10 - 20 min activity with multiple rests Endurance Deficit: Yes Endurance Deficit Description: fatigue with adl tasks OT Assessment Rehab Potential (ACUTE ONLY): Good OT Patient demonstrates impairments in the following area(s): Balance;Endurance;Pain OT Basic ADL's Functional Problem(s): Grooming;Bathing;Dressing;Toileting OT Advanced ADL's Functional Problem(s): Simple Meal Preparation;Light Housekeeping OT Transfers Functional Problem(s): Toilet;Tub/Shower OT Additional Impairment(s): None OT Plan OT Intensity: Minimum of 1-2 x/day, 45 to 90 minutes OT Frequency: 5 out of 7 days OT Duration/Estimated Length of Stay: one week OT Treatment/Interventions: Balance/vestibular training;Discharge planning;Pain management;Self Care/advanced ADL retraining;Therapeutic  Activities;Functional mobility training;Patient/family education;Therapeutic Exercise;DME/adaptive equipment instruction OT Self Feeding Anticipated Outcome(s): independent OT Basic Self-Care Anticipated Outcome(s): mod I OT Toileting Anticipated Outcome(s): mod I OT Bathroom Transfers Anticipated Outcome(s): mod I/supervision OT Recommendation Patient destination: Home Follow Up Recommendations: None Equipment Recommended: Tub/shower bench   Skilled Therapeutic Intervention Patient in bed and ready for therapy session.  Evaluation completed as documented below.  Patient is pleasant and cooperative t/o session.  Able to sit at edge of bed with CS.  Mod A to donn LSO.  Functional transfers with RW to/from bed, w/c, toilet with CG/min A.  She completed sponge bath at sink, shower was offered but she declined today due to fatigue.  She requires min a for LB bathing and dressing, set up for UB bathing and dressing.  Grooming tasks set up in seated position.  Reviewed role of OT, plan of care, precautions, safety with adl tasks and goals for therapy .  She returned to bed at close of session with bed alarm set and call bell in reach.   OT Evaluation Precautions/Restrictions  Precautions Precautions: Back;Fall Required Braces or Orthoses: Spinal Brace Spinal Brace: Lumbar corset;Applied in sitting position Restrictions Weight Bearing Restrictions: No General Chart Reviewed: Yes Vital Signs   Pain Pain Assessment Pain Scale: 0-10 Pain Score: 8  Pain Type: Surgical pain Pain Location: Back Pain Orientation: Lower Pain Descriptors / Indicators: Aching Pain Onset: With Activity Pain Intervention(s): RN made aware;Repositioned Home Living/Prior [place] expects to be discharged to:: Private residence Living Arrangements: Spouse/significant other Available Help at Discharge: Family Type of Home: House Home Access: Stairs to enter [employer]  of  Steps: 4, ramped entry at front of home Entrance Stairs-Rails: Left Home Layout: One level Bathroom Shower/Tub: Optometrist: Yes  Lives With: Spouse, Daughter Prior Function Level of Independence: Independent with basic ADLs, Independent with transfers, Independent with gait, Independent with homemaking with ambulation  Able to Take Stairs?: Yes Driving: Yes ADL ADL Eating: Independent Where Assessed-Eating: Wheelchair Grooming: Setup Where Assessed-Grooming: Sitting at sink Upper Body Bathing: Supervision/safety Where Assessed-Upper Body Bathing: Sitting at sink Lower Body Bathing: Contact guard Where Assessed-Lower Body Bathing: Standing at sink Upper Body Dressing: Supervision/safety Where Assessed-Upper Body Dressing: Wheelchair Lower Body Dressing: Minimal assistance Where Assessed-Lower Body Dressing: Wheelchair Toileting: Contact guard Where Assessed-Toileting: Glass blower/designer: Psychiatric nurse Method: Arts development officer: Grab bars Vision Baseline Vision/History: Wears glasses Wears Glasses: Reading only Patient Visual Report: No change from baseline Vision Assessment?: No apparent visual deficits Perception  Perception: Within Functional Limits Praxis Praxis: Intact Cognition Overall Cognitive Status: Within Functional Limits for tasks assessed Arousal/Alertness: Awake/alert Orientation Level: Person;Place;Situation Person: Oriented Place: Oriented Situation: Oriented Year: 2020 Month: June Day of Week: Correct Memory: Appears intact Immediate Memory Recall: Sock;Blue;Bed Memory Recall: Sock;Blue Memory Recall Sock: Without Cue Memory Recall Blue: Without Cue Attention: Sustained Sustained Attention: Appears intact Awareness: Appears intact Problem Solving: Appears intact Safety/Judgment: Appears intact Sensation Sensation Light Touch: Impaired  Detail Light Touch Impaired Details: Impaired LLE;Impaired RLE Hot/Cold: Appears Intact Proprioception: Appears Intact Coordination Gross Motor Movements are Fluid and Coordinated: Yes Fine Motor Movements are Fluid and Coordinated: Yes Motor  Motor Motor - Skilled Clinical Observations: generalized weakness Mobility  Bed Mobility Bed Mobility: Rolling Right;Right Sidelying to Sit Rolling Right: Supervision/verbal cueing Right Sidelying to Sit: Minimal Assistance - Patient > 75% Transfers Sit to Stand: Minimal Assistance - Patient > 75% Stand to Sit: Minimal Assistance - Patient > 75%  Trunk/Postural Assessment  Cervical Assessment Cervical Assessment: Within Functional Limits Thoracic Assessment Thoracic Assessment: Within Functional Limits Lumbar Assessment Lumbar Assessment: (lumbar corset) Postural Control Postural Control: (limited by pain)  Balance Dynamic Standing Balance Dynamic Standing - Balance Support: During functional activity Dynamic Standing - Level of Assistance: 4: Min assist Dynamic Standing - Comments: CGA with RW Extremity/Trunk Assessment RUE Assessment RUE Assessment: Within Functional Limits General Strength Comments: 4+/5 LUE Assessment LUE Assessment: Within Functional Limits General Strength Comments: 4+/5     Refer to Care Plan for Long Term Goals  Recommendations for other services: None    Discharge Criteria: Patient will be discharged from OT if patient refuses treatment 3 consecutive times without medical reason, if treatment goals not met, if there is a change in medical status, if patient makes no progress towards goals or if patient is discharged from hospital.  The above assessment, treatment plan, treatment alternatives and goals were discussed and mutually agreed upon: by patient  Carlos Levering 03/09/2019, 1:00 PM

## 2019-03-09 NOTE — Progress Notes (Signed)
Bilateral lower extremity venous duplex completed. Refer to "CV Proc" under chart review to view preliminary results.  03/09/2019 3:58 PM Maudry Mayhew, MHA, RVT, RDCS, RDMS

## 2019-03-09 NOTE — Evaluation (Signed)
Physical Therapy Assessment and Plan  Patient Details  Name: Jodi Bowman MRN: [id number] Date of Birth: [birth year]  PT Diagnosis: Abnormal posture, Difficulty walking, Impaired sensation and Muscle weakness Rehab Potential: Good ELOS: 5-7 days   Today's Date: 03/09/2019 PT Individual Time: 1020-1115 PT Individual Time Calculation (min): 55 min    Problem List:  Patient Active Problem List   Diagnosis Date Noted  . Lumbar radiculopathy 03/08/2019  . Orthostatic hypotension 03/06/2019  . Postoperative urinary retention 03/06/2019  . Lumbar foraminal stenosis 03/04/2019  . S/P exploratory laparotomy 03/06/2018  . Bowel obstruction ([place]) 03/06/2018  . Chest pain, rule out acute myocardial infarction 12/17/2017  . Hypokalemia 12/17/2017  . Acute lower UTI 12/17/2017  . Vertigo 04/30/2017  . Small vessel disease, cerebrovascular 04/30/2017  . Chronic low back pain 08/01/2015  . Right hip pain 08/01/2015  . Abnormality of gait 08/01/2015  . Left leg weakness   . Low back pain with radiation   . Syncope 07/30/2014  . Atypical chest pain 03/12/2014  . Lap chole [facility] April 2013 01/29/2012  . Gallstones 12/04/2011  . Thyroid nodule-non neoplastic goiter by needle aspiration 12/04/2011  . GLUCOSE INTOLERANCE 03/12/2010  . DYSLIPIDEMIA 03/12/2010  . Chronic migraine 03/12/2010  . Carotid stenosis 03/12/2010  . CEREBROVASCULAR ACCIDENT 03/12/2010  . LIPOMA 01/29/2010  . HEADACHE 01/29/2010  . ANKLE INJURY, RIGHT 04/12/2009  . PHARYNGITIS 03/21/2009  . Backache 03/01/2009  . ALLERGIC RHINITIS 03/17/2007  . LOW BACK PAIN 03/17/2007  . Essential hypertension 01/01/2007  . ANXIETY STATE NOS 09/11/2005    Past Medical History:  Past Medical History:  Diagnosis Date  . Arthritis   . Calculus of gallbladder without mention of cholecystitis or obstruction   . Dizziness   . Fibromyalgia   . GERD (gastroesophageal reflux disease)   . Goiter   . Hypercholesteremia   .  Hypertension   . Lower back pain   . Migraine   . Nontoxic uninodular goiter    sees dr vollmer at [facility]  . Obesity   . PONV (postoperative nausea and vomiting)   . Sleep apnea    STOPBANG=5  . Stroke [facility]) 2000   Past Surgical History:  Past Surgical History:  Procedure Laterality Date  . ABDOMINAL HYSTERECTOMY    . BOWEL RESECTION N/A 03/06/2018   Procedure: SMALL BOWEL ANASTAMOSIS;  Surgeon: Clovis Riley, MD;  Location: [place];  Service: General;  Laterality: N/A;  . CATARACT EXTRACTION W/ INTRAOCULAR LENS IMPLANT Bilateral   . CESAREAN SECTION  yrs ago   done x 2  . CHOLECYSTECTOMY  01/05/2012   Procedure: LAPAROSCOPIC CHOLECYSTECTOMY WITH INTRAOPERATIVE CHOLANGIOGRAM;  Surgeon: Pedro Earls, MD;  Location: [facility];  Service: General;  Laterality: N/A;  . COLONOSCOPY  10/08/2012   Procedure: COLONOSCOPY;  Surgeon: Beryle Beams, MD;  Location: [facility];  Service: Endoscopy;  Laterality: N/A;  . KNEE ARTHROSCOPY  one 1995 and 1 in 1997   both knees done  . LAPAROSCOPY N/A 03/06/2018   Procedure: LAPAROSCOPY DIAGNOSTIC WITH LYSIS OF ADHESIONS;  Surgeon: Clovis Riley, MD;  Location: [place];  Service: General;  Laterality: N/A;  . LAPAROTOMY N/A 03/06/2018   Procedure: EXPLORATORY LAPAROTOMY, RESECTION OF DISTAL ROUX, CLOSURE OF INTERNAL HERNIA;  Surgeon: Clovis Riley, MD;  Location: [place];  Service: General;  Laterality: N/A;  . POSTERIOR LUMBAR FUSION  03/04/2019  . surgery for endometriosis  yrs ago  . thryoid biopsy  December 01, 2011  at [facility]    Assessment & Plan Clinical Impression: Patient is a 58 y.o. year old female with recent admission to the hospital on  03/04/2019 with chronic intractable back pain rating to lower extremities failing all conservative management. Work-up demonstrated evidence of marked disc degeneration with disc space collapse and significant foraminal stenosis with radiculopathy at L4-5 and L5-S1. Underwent bilateral L4-5, L5-S1  decompressive laminotomies and foraminotomies with posterior lumbar interbody fusion 03/04/2019 per Dr. Annette Stable.  Patient transferred to CIR on 03/08/2019 .   Patient currently requires min with mobility secondary to muscle weakness and decreased standing balance, decreased postural control, decreased balance strategies and difficulty maintaining precautions.  Prior to hospitalization, patient was independent  with mobility and lived with Spouse, Daughter in a House home.  Home access is 4Stairs to enter.  Patient will benefit from skilled PT intervention to maximize safe functional mobility, minimize fall risk and decrease caregiver burden for planned discharge home with intermittent assist.  Anticipate patient will benefit from follow up [facility] at discharge.  PT - End of Session Activity Tolerance: Tolerates 30+ min activity with multiple rests Endurance Deficit: Yes PT Assessment Rehab Potential (ACUTE/IP ONLY): Good PT Barriers to Discharge: Decreased caregiver support PT Patient demonstrates impairments in the following area(s): Balance;Endurance;Motor;Pain;Sensory PT Transfers Functional Problem(s): Bed Mobility;Bed to Chair;Car;Furniture PT Locomotion Functional Problem(s): Stairs;Ambulation PT Plan PT Intensity: Minimum of 1-2 x/day ,45 to 90 minutes PT Frequency: 5 out of 7 days PT Duration Estimated Length of Stay: 5-7 days PT Treatment/Interventions: Ambulation/gait training;Community reintegration;DME/adaptive equipment instruction;Neuromuscular re-education;Stair training;UE/LE Strength taining/ROM;Wheelchair propulsion/positioning;UE/LE Coordination activities;Therapeutic Activities;Pain management;Balance/vestibular training;Discharge planning;Functional mobility training;Patient/family education;Splinting/orthotics;Therapeutic Exercise PT Transfers Anticipated Outcome(s): mod I PT Locomotion Anticipated Outcome(s): mod I PT Recommendation Follow Up Recommendations: Home health PT Patient  destination: Home Equipment Recommended: To be determined  Skilled Therapeutic Intervention Pt participated in skilled PT eval and was educated on PT goals and POC.  Pt able to perform log roll with cues, sidelying to sit with supervision, min A for sit to supine.  Sit <> stand throughout session with CGA from bed and w/c.  Furniture transfers to chair and sofa with CGA.  Gait in controlled environment 100' x 3 during session with RW and CGA.  Gait up/down ramp with RW and CGA. Stair negotiation with 2 handrails with CGA x 4 stairs, then with 1 railing to simulate home entry with min A, step to pattern.  Pt limited by pain during session requiring frequent rest breaks. RN made aware of pain.  Pt left in bed with all needs at hand.  PT Evaluation Precautions/Restrictions Precautions Precautions: Back;Fall Spinal Brace: Lumbar corset;Applied in sitting position Restrictions Weight Bearing Restrictions: No Pain Pain Assessment Pain Scale: 0-10 Pain Score: 5  Pain Type: Surgical pain Pain Location: Back Pain Orientation: Lower Pain Descriptors / Indicators: Aching Pain Frequency: Intermittent Pain Onset: With Activity Patients Stated Pain Goal: 4 Pain Intervention(s): RN made aware;Repositioned Home Living/Prior Functioning Home Living Available Help at Discharge: Family Type of Home: House Home Access: Stairs to enter Technical brewer of Steps: 4 Entrance Stairs-Rails: Left Home Layout: One level  Lives With: Spouse;Daughter Prior Function Level of Independence: Independent with basic ADLs;Independent with transfers;Independent with gait  Able to Take Stairs?: Yes Driving: Yes  Cognition Overall Cognitive Status: Within Functional Limits for tasks assessed Arousal/Alertness: Awake/alert Orientation Level: Oriented X4 Sensation Sensation Light Touch: Impaired Detail Light Touch Impaired Details: Impaired LLE;Impaired RLE Proprioception: Appears  Intact Coordination Gross Motor Movements are Fluid and Coordinated: Yes  Motor  Motor Motor - Skilled Clinical Observations: generalized weakness  Mobility Bed Mobility Bed Mobility: Rolling Right;Right Sidelying to Sit Rolling Right: Supervision/verbal cueing Right Sidelying to Sit: Minimal Assistance - Patient > 75% Transfers Transfers: Stand Pivot Transfers;Stand to Sit;Sit to Stand Sit to Stand: Contact Guard/Touching assist Stand to Sit: Contact Guard/Touching assist Stand Pivot Transfers: Contact Guard/Touching assist Transfer (Assistive device): Rolling walker Locomotion  Gait Ambulation: Yes Gait Assistance: Contact Guard/Touching assist Gait Distance (Feet): 100 Feet Assistive device: Rolling walker Stairs / Additional Locomotion Stairs: Yes Stairs Assistance: Contact Guard/Touching assist Stair Management Technique: One rail Left Number of Stairs: 4 Ramp: Contact Guard/touching assist Wheelchair Mobility Wheelchair Mobility: No  Trunk/Postural Assessment  Cervical Assessment Cervical Assessment: Within Functional Limits Thoracic Assessment Thoracic Assessment: Within Functional Limits Lumbar Assessment Lumbar Assessment: (lumbar corset) Postural Control Postural Control: (limited by pain)  Balance Dynamic Standing Balance Dynamic Standing - Comments: CGA with RW Extremity Assessment      RLE Assessment General Strength Comments: grossly 3/5 LLE Assessment General Strength Comments: grossly 3/5    Refer to Care Plan for Long Term Goals  Recommendations for other services: None   Discharge Criteria: Patient will be discharged from PT if patient refuses treatment 3 consecutive times without medical reason, if treatment goals not met, if there is a change in medical status, if patient makes no progress towards goals or if patient is discharged from hospital.  The above assessment, treatment plan, treatment alternatives and goals were discussed and  mutually agreed upon: by patient  Fujie Dickison 03/09/2019, 11:22 AM

## 2019-03-09 NOTE — Progress Notes (Signed)
Patient information reviewed and entered into eRehab System by Becky Kenlee Vogt, PPS coordinator. Information including medical coding, function ability, and quality indicators will be reviewed and updated through discharge.  During the Covid 19 public health emergency, the inpatient rehabilitation unit of the [facility] will use acute care beds on another unit to provide each patient with a private room. This effort is to assure proper infection control and to optimize care management during this public health emergency.     

## 2019-03-09 NOTE — Progress Notes (Addendum)
[facility] PHYSICAL MEDICINE & REHABILITATION PROGRESS NOTE   Subjective/Complaints: Complains of bilateral lower extremity pain.  No increasing weakness.  Reviewed prior history, positive for fibromyalgia syndrome  Review of systems denies chest pain shortness of breath nausea vomiting diarrhea or constipation.   Objective:   No results found. Recent Labs    03/06/19 1112 03/09/19 0707  WBC 12.9* 8.4  HGB 9.1* 9.1*  HCT 28.0* 28.3*  PLT 157 212   Recent Labs    03/06/19 1112 03/09/19 0707  NA 140 139  K 3.8 4.1  CL 108 104  CO2 24 29  GLUCOSE 99 88  BUN 9 8  CREATININE 0.65 0.72  CALCIUM 7.7* 8.7*    Intake/Output Summary (Last 24 hours) at 03/09/2019 1023 Last data filed at 03/09/2019 0700 Gross per 24 hour  Intake 680 ml  Output -  Net 680 ml     Physical Exam: Vital Signs Blood pressure 117/63, pulse 64, temperature 98.5 F (36.9 C), temperature source Oral, resp. rate 17, height 4\' 11"  (1.499 m), weight 66.7 kg, SpO2 100 %.   General: No acute distress Mood and affect are appropriate Heart: Regular rate and rhythm no rubs murmurs or extra sounds Lungs: Clear to auscultation, breathing unlabored, no rales or wheezes Abdomen: Positive bowel sounds, soft nontender to palpation, nondistended Extremities: No clubbing, cyanosis, or edema Skin: No evidence of breakdown, no evidence of rash, lumbar incision well-healed without drainage Neurologic: Cranial nerves II through XII intact, motor strength is 5/5 in bilateral deltoid, bicep, tricep, grip,4/5 hip flexor, knee extensors, ankle dorsiflexor and plantar flexor Sensory exam normal sensation to light touch  lower extremities  Musculoskeletal: Full range of motion in all 4 extremities. No joint swelling   Assessment/Plan: 1. Functional deficits secondary to lumbar spinal stenosis with bilateral lower extremity radiculopathy status post fusion 03/04/2019 which require 3+ hours per day of interdisciplinary  therapy in a comprehensive inpatient rehab setting.  Physiatrist is providing close team supervision and 24 hour management of active medical problems listed below.  Physiatrist and rehab team continue to assess barriers to discharge/monitor patient progress toward functional and medical goals  Care Tool:  Bathing        Body parts bathed by helper: Front perineal area, Buttocks     Bathing assist Assist Level: Moderate Assistance - Patient 50 - 74%     Upper Body Dressing/Undressing Upper body dressing   What is the patient wearing?: Hospital gown only    Upper body assist      Lower Body Dressing/Undressing Lower body dressing      What is the patient wearing?: Underwear/pull up     Lower body assist Assist for lower body dressing: Minimal Assistance - Patient > 75%     Toileting Toileting    Toileting assist Assist for toileting: Minimal Assistance - Patient > 75%     Transfers Chair/bed transfer  Transfers assist           Locomotion Ambulation   Ambulation assist              Walk 10 feet activity   Assist           Walk 50 feet activity   Assist           Walk 150 feet activity   Assist           Walk 10 feet on uneven surface  activity   Assist  Wheelchair     Assist               Wheelchair 50 feet with 2 turns activity    Assist            Wheelchair 150 feet activity     Assist          Medical Problem List and Plan: 1.Decreased functional mobilitysecondary to lumbar stenosis with radiculopathy. Status post L4-L5-S1 decompression posterior lumbar interbody fusion 03/04/2019. Back brace when out of bed -CIR eval's with PT OT 2. Antithrombotics: -DVT/anticoagulation:SCDs. Check vascular study -antiplatelet therapy: Aspirin 81 mg daily 3. Pain Management:Cymbalta 30 mg daily, Lyrica 25 mg 3 times daily, hydrocodoneas needed.  Has  fibromyalgia syndrome which will complicate postoperative pain management -having ongoing radicular pain.  Increase Lyrica to 50 mg 3 times daily 4. Mood:Provide emotional support -antipsychotic agents: N/A 5. Neuropsych: This patientiscapable of making decisions on herown behalf. 6. Skin/Wound Care:Routine skin checks 7. Fluids/Electrolytes/Nutrition:Routine in and outs with follow-up chemistries 8.Acute blood loss anemia. Follow-up CBC 9. Transient orthostasis improved. Norvasc 5 mg daily, midamor5 mg daily, Cozaar 100 mg daily, Toprol-XL 50 mg daily. Monitor with increased mobility Vitals:   03/08/19 2005 03/09/19 0507  BP: 132/70 117/63  Pulse: 68 64  Resp:  17  Temp: 98.5 F (36.9 C) 98.5 F (36.9 C)  SpO2: 100% 100%  Controlled, 03/09/2019 10. Urinary retention. Improving. Routine toileting schedule -check PVR's x 3, I/C as needed 11. Constipation. Laxative assistance    LOS: 1 days A FACE TO FACE EVALUATION WAS PERFORMED  Charlett Blake 03/09/2019, 10:23 AM

## 2019-03-09 NOTE — Progress Notes (Signed)
Physical Therapy Session Note  Patient Details  Name: LATOSHIA MONRROY MRN: [id number] Date of Birth: [birth year]  Today's Date: 03/09/2019 PT Individual Time: 1415-1445 PT Individual Time Calculation (min): 30 min   Short Term Goals: Week 1:  PT Short Term Goal 1 (Week 1): = LTG  Skilled Therapeutic Interventions/Progress Updates:  Pt in bed with increased pain, states "I will try" to therapy.  Pt requests to use restroom.  Requires min/mod A to stand from bed with RW due to pain.  Min A for toilet transfers and clothing management. Pt performs gait in room 20' x 2 with CGA.  Standing therex 2 x 10 hip abd, hip ext.  Seated x 10 HS curl, hip abd, LAQ, hip add squeeze. Pt then returns to supine with supervision. Pt unable to complete full session due to fatigue and pain, RN aware.  Therapy Documentation Precautions:  Precautions Precautions: Back, Fall Required Braces or Orthoses: Spinal Brace Spinal Brace: Lumbar corset, Applied in sitting position Restrictions Weight Bearing Restrictions: No General: PT Amount of Missed Time (min): 30 Minutes PT Missed Treatment Reason: Pain;Patient fatigue Pain: Pt c/o 7/10 low back pain, meds given prior to session   Therapy/Group: Individual Therapy  Demara Lover 03/09/2019, 3:07 PM

## 2019-03-09 NOTE — Care Management Note (Signed)
[facility] Individual Statement of Services  Patient Name:  Jodi Bowman  Date:  03/09/2019  Welcome to the [facility].  Our goal is to provide you with an individualized program based on your diagnosis and situation, designed to meet your specific needs.  With this comprehensive rehabilitation program, you will be expected to participate in at least 3 hours of rehabilitation therapies Monday-Friday, with modified therapy programming on the weekends.  Your rehabilitation program will include the following services:  Physical Therapy (PT), Occupational Therapy (OT), 24 hour per day rehabilitation nursing, Case Management (Social Worker), Rehabilitation Medicine, Nutrition Services and Pharmacy Services  Weekly team conferences will be held on Wednesday to discuss your progress.  Your Social Worker will talk with you frequently to get your input and to update you on team discussions.  Team conferences with you and your family in attendance may also be held.  Expected length of stay: 7 days  Overall anticipated outcome: mod/i level  Depending on your progress and recovery, your program may change. Your Social Worker will coordinate services and will keep you informed of any changes. Your Social Worker's name and contact numbers are listed  below.  The following services may also be recommended but are not provided by the [facility]:    [place] will be made to provide these services after discharge if needed.  Arrangements include referral to agencies that provide these services.  Your insurance has been verified to be:  Medicare & medicaid Your primary doctor is:  [facility]  Pertinent information will be shared with your doctor and your insurance company.  Social Worker:  Ovidio Kin, [place] or ([phone]  Information  discussed with and copy given to patient by: Elease Hashimoto, 03/09/2019, 1:53 PM

## 2019-03-09 NOTE — IPOC Note (Signed)
Overall Plan of Care [facility]) Patient Details Name: Jodi Bowman MRN: [id number] DOB: 1961/07/31  Admitting Diagnosis: <principal problem not specified>  Hospital Problems: Active Problems:   Lumbar radiculopathy     Functional Problem List: Nursing Endurance, Motor, Pain, Safety, Skin Integrity  PT Balance, Endurance, Motor, Pain, Sensory  OT Balance, Endurance, Pain  SLP    TR         Basic ADL's: OT Grooming, Bathing, Dressing, Toileting     Advanced  ADL's: OT Simple Meal Preparation, Light Housekeeping     Transfers: PT Bed Mobility, Bed to Chair, Car, Manufacturing systems engineer, Metallurgist: PT Stairs, Ambulation     Additional Impairments: OT None  SLP        TR      Anticipated Outcomes Item Anticipated Outcome  Self Feeding independent  Swallowing      Basic self-care  mod I  Toileting  mod I   Bathroom Transfers mod I/supervision  Bowel/Bladder  Patient will remain continent of bowel and bladder during admission  Transfers  mod I  Locomotion  mod I  Communication     Cognition     Pain  Patient will be pain free or pain less than 3 during admission.   Safety/Judgment  Patient will be free from falls and adhere to safety plan   Therapy Plan: PT Intensity: Minimum of 1-2 x/day ,45 to 90 minutes PT Frequency: 5 out of 7 days PT Duration Estimated Length of Stay: 5-7 days OT Intensity: Minimum of 1-2 x/day, 45 to 90 minutes OT Frequency: 5 out of 7 days OT Duration/Estimated Length of Stay: one week     Due to the current state of emergency, patients may not be receiving their 3-hours of Medicare-mandated therapy.   Team Interventions: Nursing Interventions Pain Management, Medication Management, Disease Management/Prevention  PT interventions Ambulation/gait training, Community reintegration, DME/adaptive equipment instruction, Neuromuscular re-education, Stair training, UE/LE Strength taining/ROM, Wheelchair  propulsion/positioning, UE/LE Coordination activities, Therapeutic Activities, Pain management, Training and development officer, Discharge planning, Functional mobility training, Patient/family education, Splinting/orthotics, Therapeutic Exercise  OT Interventions Balance/vestibular training, Discharge planning, Pain management, Self Care/advanced ADL retraining, Therapeutic Activities, Functional mobility training, Patient/family education, Therapeutic Exercise, DME/adaptive equipment instruction  SLP Interventions    TR Interventions    SW/CM Interventions Discharge Planning, Psychosocial Support, Patient/Family Education   Barriers to Discharge MD  Medical stability  Nursing      PT Decreased caregiver support    OT      SLP      SW       Team Discharge Planning: Destination: PT-Home ,OT- Home , SLP-  Projected Follow-up: PT-Home health PT, OT-  None, SLP-  Projected Equipment Needs: PT-To be determined, OT- Tub/shower bench, SLP-  Equipment Details: PT- , OT-  Patient/family involved in discharge planning: PT- Patient,  OT-Patient, SLP-   MD ELOS: 7-10d Medical Rehab Prognosis:  Good Assessment:   58 year old right-handed female with history of hypertension, hyperlipidemia, fibromyalgia, 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 Presented 03/04/2019 with chronic intractable back pain rating to lower extremities failing all conservative management. Work-up demonstrated evidence of marked disc degeneration with disc space collapse and significant foraminal stenosis with radiculopathy at L4-5 and L5-S1. Underwent bilateral L4-5, L5-S1 decompressive laminotomies and foraminotomies with posterior lumbar interbody fusion 03/04/2019 per Dr. Annette Stable. Hospital course pain management. Back brace when out of bed applied in sitting position. Acute blood loss anemia 9.1. Patient with intermittent bouts of orthostatic hypotension  transient likely multifactorial and monitored that has  improved. She did initially receive some IV fluids. Postoperative urinary retention improved with routine toileting schedule   Now requiring 24/7 Rehab RN,MD, as well as CIR level PT, OT .  Treatment team will focus on ADLs and mobility with goals set at Mod I See Team Conference Notes for weekly updates to the plan of care

## 2019-03-09 NOTE — Progress Notes (Signed)
Social Work  Social Work Assessment and Plan  Patient Details  Name: VRINDA HECKSTALL MRN: [id number] Date of Birth: 21-Apr-1961  Today's Date: 03/09/2019  Problem List:  Patient Active Problem List   Diagnosis Date Noted  . Lumbar radiculopathy 03/08/2019  . Orthostatic hypotension 03/06/2019  . Postoperative urinary retention 03/06/2019  . Lumbar foraminal stenosis 03/04/2019  . S/P exploratory laparotomy 03/06/2018  . Bowel obstruction ([place]) 03/06/2018  . Chest pain, rule out acute myocardial infarction 12/17/2017  . Hypokalemia 12/17/2017  . Acute lower UTI 12/17/2017  . Vertigo 04/30/2017  . Small vessel disease, cerebrovascular 04/30/2017  . Chronic low back pain 08/01/2015  . Right hip pain 08/01/2015  . Abnormality of gait 08/01/2015  . Left leg weakness   . Low back pain with radiation   . Syncope 07/30/2014  . Atypical chest pain 03/12/2014  . Lap chole [facility] April 2013 01/29/2012  . Gallstones 12/04/2011  . Thyroid nodule-non neoplastic goiter by needle aspiration 12/04/2011  . GLUCOSE INTOLERANCE 03/12/2010  . DYSLIPIDEMIA 03/12/2010  . Chronic migraine 03/12/2010  . Carotid stenosis 03/12/2010  . CEREBROVASCULAR ACCIDENT 03/12/2010  . LIPOMA 01/29/2010  . HEADACHE 01/29/2010  . ANKLE INJURY, RIGHT 04/12/2009  . PHARYNGITIS 03/21/2009  . Backache 03/01/2009  . ALLERGIC RHINITIS 03/17/2007  . LOW BACK PAIN 03/17/2007  . Essential hypertension 01/01/2007  . ANXIETY STATE NOS 09/11/2005   Past Medical History:  Past Medical History:  Diagnosis Date  . Arthritis   . Calculus of gallbladder without mention of cholecystitis or obstruction   . Dizziness   . Fibromyalgia   . GERD (gastroesophageal reflux disease)   . Goiter   . Hypercholesteremia   . Hypertension   . Lower back pain   . Migraine   . Nontoxic uninodular goiter    sees dr vollmer at [facility]  . Obesity   . PONV (postoperative nausea and vomiting)   . Sleep apnea    STOPBANG=5  .  Stroke [facility]) 2000   Past Surgical History:  Past Surgical History:  Procedure Laterality Date  . ABDOMINAL HYSTERECTOMY    . BOWEL RESECTION N/A 03/06/2018   Procedure: SMALL BOWEL ANASTAMOSIS;  Surgeon: Clovis Riley, MD;  Location: [place];  Service: General;  Laterality: N/A;  . CATARACT EXTRACTION W/ INTRAOCULAR LENS IMPLANT Bilateral   . CESAREAN SECTION  yrs ago   done x 2  . CHOLECYSTECTOMY  01/05/2012   Procedure: LAPAROSCOPIC CHOLECYSTECTOMY WITH INTRAOPERATIVE CHOLANGIOGRAM;  Surgeon: Pedro Earls, MD;  Location: [facility];  Service: General;  Laterality: N/A;  . COLONOSCOPY  10/08/2012   Procedure: COLONOSCOPY;  Surgeon: Beryle Beams, MD;  Location: [facility];  Service: Endoscopy;  Laterality: N/A;  . KNEE ARTHROSCOPY  one 1995 and 1 in 1997   both knees done  . LAPAROSCOPY N/A 03/06/2018   Procedure: LAPAROSCOPY DIAGNOSTIC WITH LYSIS OF ADHESIONS;  Surgeon: Clovis Riley, MD;  Location: [place];  Service: General;  Laterality: N/A;  . LAPAROTOMY N/A 03/06/2018   Procedure: EXPLORATORY LAPAROTOMY, RESECTION OF DISTAL ROUX, CLOSURE OF INTERNAL HERNIA;  Surgeon: Clovis Riley, MD;  Location: [place];  Service: General;  Laterality: N/A;  . POSTERIOR LUMBAR FUSION  03/04/2019  . surgery for endometriosis  yrs ago  . thryoid biopsy  December 01, 2011    at [facility]   Social History:  reports that she has never smoked. She has never used smokeless tobacco. She reports previous alcohol use. She reports that she does not  use drugs.  Family / Support Systems Marital Status: Married Patient Roles: Spouse, Parent Spouse/Significant Other: Gwyndolyn Saxon [phone] Children: 89 yo daughter in the home they have seven other children-grown Other Supports: Children and extended family Anticipated Caregiver: husband and grown children Ability/Limitations of Caregiver: husband works 10-7 pm at [employer] Caregiver Availability: Evenings only Family Dynamics: Close knit family who pull together in times  of need. P thas been having back issues for years and is so glad she has had surgery now. She hopes form here it will be better than it was.  Social History Preferred language: English Religion: Non-Denominational Cultural Background: No issues Education: HS Read: Yes Write: Yes Employment Status: Disabled Public relations account executive Issues: No issues Guardian/Conservator: None-according to MD pt is capable of making her own decisions while here.   Abuse/Neglect Abuse/Neglect Assessment Can Be Completed: Yes Physical Abuse: Denies Verbal Abuse: Denies Sexual Abuse: Denies Exploitation of patient/patient's resources: Denies Self-Neglect: Denies  Emotional Status Pt's affect, behavior and adjustment status: Pt is motivated to do wel and recover and get back home. She feels once her back is healed she will be doing better than she was before the surgery. She has back issues for years. She feels her family will do what she can't when she comes home. Recent Psychosocial Issues: other health issues-fibromyalgia and SBO surgery last year Psychiatric History: No history deferred depression screen due to coping appropriatley and doing well. Her pain is currently being managed which pt says helps. Substance Abuse History: No issues  Patient / Family Perceptions, Expectations & Goals Pt/Family understanding of illness & functional limitations: Pt is able to explain her back surgery and the issues surrounding this. She talks with the MD daily and is hoepful to go home soon. She was going to go from acute then came to CIR. Premorbid pt/family roles/activities: Wife, Mom, retiree, grandmother, friend, church member Anticipated changes in roles/activities/participation: resume Pt/family expectations/goals: Pt states: " I want to be able to do for myself before I leave here."  Husband states; " We will help her but she wants to do on her own."  [employer]: Other  (Comment)([facility] to follow at home for home health) Premorbid Home Care/DME Agencies: Other (Comment)(has rw and cane) Transportation available at discharge: Family members  Discharge Planning Living Arrangements: Spouse/significant other, Children Support Systems: Spouse/significant other, Children, Other relatives, Friends/neighbors, Social worker community Type of Residence: Private residence Insurance Resources: [employer], [employer] (specify county) Pensions consultant: SSD, Family Support Financial Screen Referred: No Living Expenses: Education officer, community Management: Spouse Does the patient have any problems obtaining your medications?: No Home Management: Self and husband Patient/Family Preliminary Plans: Return home with husband who is there in the am and the evenings and their 60 yo will be there with her while husband works. Pt should be mod/i before going home and is doing well already. Will await therapy evaluations and work on discharge needs. Social Work Anticipated Follow Up Needs: HH/OP  Clinical Impression Pleasant female who is feeling better, she is confident she will do well here and will not need to be here long. She want sot be mod/i since her 60 yo is with here while her husband works till 7 pm. Will work on discharge needs, has equipment from acute and follow up via [facility].  Elease Hashimoto 03/09/2019, 2:55 PM

## 2019-03-10 ENCOUNTER — Inpatient Hospital Stay (HOSPITAL_COMMUNITY): Payer: Medicare Other

## 2019-03-10 ENCOUNTER — Inpatient Hospital Stay (HOSPITAL_COMMUNITY): Payer: Medicare Other | Admitting: Occupational Therapy

## 2019-03-10 ENCOUNTER — Inpatient Hospital Stay (HOSPITAL_COMMUNITY): Payer: Medicare Other | Admitting: Physical Therapy

## 2019-03-10 NOTE — Progress Notes (Signed)
[facility] PHYSICAL MEDICINE & REHABILITATION PROGRESS NOTE   Subjective/Complaints: Poor sleep with urinary freq , no burning.  No PVRs recently Has LE pain bilateral , Lyrica just increased yesterday   Review of systems denies chest pain shortness of breath nausea vomiting diarrhea or constipation.   Objective:   Vas [place] Lower Extremity Venous (dvt)  Result Date: 03/09/2019  Lower Venous Study Indications: Swelling.  Performing Technologist: Maudry Mayhew MHA, RDMS, RVT, RDCS  Examination Guidelines: A complete evaluation includes B-mode imaging, spectral Doppler, color Doppler, and power Doppler as needed of all accessible portions of each vessel. Bilateral testing is considered an integral part of a complete examination. Limited examinations for reoccurring indications may be performed as noted.  +---------+---------------+---------+-----------+----------+-------+ RIGHT    CompressibilityPhasicitySpontaneityPropertiesSummary +---------+---------------+---------+-----------+----------+-------+ CFV      Full           Yes      Yes                          +---------+---------------+---------+-----------+----------+-------+ SFJ      Full                                                 +---------+---------------+---------+-----------+----------+-------+ FV Prox  Full                                                 +---------+---------------+---------+-----------+----------+-------+ FV Mid   Full                                                 +---------+---------------+---------+-----------+----------+-------+ FV DistalFull                                                 +---------+---------------+---------+-----------+----------+-------+ PFV      Full                                                 +---------+---------------+---------+-----------+----------+-------+ POP      Full           Yes      Yes                           +---------+---------------+---------+-----------+----------+-------+ PTV      Full                                                 +---------+---------------+---------+-----------+----------+-------+ PERO     Full                                                 +---------+---------------+---------+-----------+----------+-------+   +---------+---------------+---------+-----------+----------+-------+  LEFT     CompressibilityPhasicitySpontaneityPropertiesSummary +---------+---------------+---------+-----------+----------+-------+ CFV      Full           Yes      Yes                          +---------+---------------+---------+-----------+----------+-------+ SFJ      Full                                                 +---------+---------------+---------+-----------+----------+-------+ FV Prox  Full                                                 +---------+---------------+---------+-----------+----------+-------+ FV Mid   Full                                                 +---------+---------------+---------+-----------+----------+-------+ FV DistalFull                                                 +---------+---------------+---------+-----------+----------+-------+ PFV      Full                                                 +---------+---------------+---------+-----------+----------+-------+ POP      Full           Yes      Yes                          +---------+---------------+---------+-----------+----------+-------+ PTV      Full                                                 +---------+---------------+---------+-----------+----------+-------+ PERO     Full                                                 +---------+---------------+---------+-----------+----------+-------+     Summary: Right: There is no evidence of deep vein thrombosis in the lower extremity. No cystic structure found in the popliteal fossa. Left: There is  no evidence of deep vein thrombosis in the lower extremity. No cystic structure found in the popliteal fossa.  *See table(s) above for measurements and observations. Electronically signed by Curt Jews MD on 03/09/2019 at 4:58:13 PM.    Final    Recent Labs    03/09/19 0707  WBC 8.4  HGB 9.1*  HCT 28.3*  PLT 212   Recent Labs    03/09/19 0707  NA 139  K 4.1  CL 104  CO2 29  GLUCOSE 88  BUN 8  CREATININE 0.72  CALCIUM 8.7*    Intake/Output Summary (Last 24 hours) at 03/10/2019 1158 Last data filed at 03/10/2019 0948 Gross per 24 hour  Intake 240 ml  Output -  Net 240 ml     Physical Exam: Vital Signs Blood pressure 135/66, pulse 67, temperature 98.3 F (36.8 C), temperature source Oral, resp. rate 17, height 4\' 11"  (1.499 m), weight 70.6 kg, SpO2 100 %.   General: No acute distress Mood and affect are appropriate Heart: Regular rate and rhythm no rubs murmurs or extra sounds Lungs: Clear to auscultation, breathing unlabored, no rales or wheezes Abdomen: Positive bowel sounds, soft nontender to palpation, nondistended Extremities: No clubbing, cyanosis, or edema Skin: No evidence of breakdown, no evidence of rash, lumbar incision well-healed without drainage Neurologic: Cranial nerves II through XII intact, motor strength is 5/5 in bilateral deltoid, bicep, tricep, grip,4/5 hip flexor, knee extensors, ankle dorsiflexor and plantar flexor Sensory exam normal sensation to light touch  lower extremities  Musculoskeletal: Full range of motion in all 4 extremities. No joint swelling   Assessment/Plan: 1. Functional deficits secondary to lumbar spinal stenosis with bilateral lower extremity radiculopathy status post fusion 03/04/2019 which require 3+ hours per day of interdisciplinary therapy in a comprehensive inpatient rehab setting.  Physiatrist is providing close team supervision and 24 hour management of active medical problems listed below.  Physiatrist and rehab team  continue to assess barriers to discharge/monitor patient progress toward functional and medical goals  Care Tool:  Bathing    Body parts bathed by patient: Right arm, Left arm, Chest, Abdomen, Front perineal area, Buttocks, Right upper leg, Left upper leg, Right lower leg, Left lower leg, Face   Body parts bathed by helper: Front perineal area, Buttocks     Bathing assist Assist Level: Supervision/Verbal cueing     Upper Body Dressing/Undressing Upper body dressing   What is the patient wearing?: Pull over shirt, Bra    Upper body assist Assist Level: Supervision/Verbal cueing    Lower Body Dressing/Undressing Lower body dressing      What is the patient wearing?: Underwear/pull up, Pants     Lower body assist Assist for lower body dressing: Supervision/Verbal cueing     Toileting Toileting    Toileting assist Assist for toileting: Supervision/Verbal cueing     Transfers Chair/bed transfer  Transfers assist     Chair/bed transfer assist level: Supervision/Verbal cueing     Locomotion Ambulation   Ambulation assist      Assist level: Contact Guard/Touching assist Assistive device: Walker-rolling Max distance: 100   Walk 10 feet activity   Assist     Assist level: Contact Guard/Touching assist Assistive device: Walker-rolling   Walk 50 feet activity   Assist    Assist level: Contact Guard/Touching assist Assistive device: Walker-rolling    Walk 150 feet activity   Assist Walk 150 feet activity did not occur: Safety/medical concerns         Walk 10 feet on uneven surface  activity   Assist Walk 10 feet on uneven surfaces activity did not occur: Safety/medical concerns         Wheelchair     Assist Will patient use wheelchair at discharge?: No             Wheelchair 50 feet with 2 turns activity    Assist            Wheelchair 150 feet activity  Assist          Medical Problem List and  Plan: 1.Decreased functional mobilitysecondary to lumbar stenosis with radiculopathy. Status post L4-L5-S1 decompression posterior lumbar interbody fusion 03/04/2019. Back brace when out of bed -CIR eval's with PT OT 2. Antithrombotics: -DVT/anticoagulation:SCDs. Negative vascular study -antiplatelet therapy: Aspirin 81 mg daily 3. Pain Management:Cymbalta 30 mg daily, Lyrica 25 mg 3 times daily, hydrocodoneas needed.  Has fibromyalgia syndrome which will complicate postoperative pain management -having ongoing radicular pain.  Increase Lyrica to 50 mg 3 times daily, may need to increase in am 4. Mood:Provide emotional support -antipsychotic agents: N/A 5. Neuropsych: This patientiscapable of making decisions on herown behalf. 6. Skin/Wound Care:Routine skin checks 7. Fluids/Electrolytes/Nutrition:Routine in and outs with follow-up chemistries 8.Acute blood loss anemia. Follow-up CBC 9. Transient orthostasis improved. Norvasc 5 mg daily, midamor5 mg daily, Cozaar 100 mg daily, Toprol-XL 50 mg daily. Monitor with increased mobility Vitals:   03/09/19 2157 03/10/19 0317  BP: 115/69 135/66  Pulse: 70 67  Resp: 17 17  Temp: 98.4 F (36.9 C) 98.3 F (36.8 C)  SpO2: 100% 100%  Controlled, 03/10/2019 10. Urinary retention. Improving. Routine toileting schedule -Recheck PVR's x 3, I/C as needed 11. Constipation. Laxative assistance    LOS: 2 days A FACE TO FACE EVALUATION WAS PERFORMED  Jodi Bowman 03/10/2019, 11:58 AM

## 2019-03-10 NOTE — Progress Notes (Signed)
Occupational Therapy Session Note  Patient Details  Name: Jodi Bowman MRN: [id number] Date of Birth: [birth year]  Today's Date: 03/10/2019 OT Individual Time: 5364-6803 OT Individual Time Calculation (min): 75 min    Short Term Goals: Week 1:  OT Short Term Goal 1 (Week 1): na due to short LOS  Skilled Therapeutic Interventions/Progress Updates:    Patient agreeable to take a shower this AM. Able to ambulate to bathroom to complete toileting and toilet transfer with RW at Supervision level. Independent with donning and doffing spinal brace. Pt was able to shower seated with VC for technique to cross legs over each other to wash feet and minimize bending. Suggested use of LHS in shower. Patient complete all bathing and dressing at supervision level. During session, patient did require VC for technique during sit to stand and stand to sit to increase safety and minimize pain. Pt did voice a 9/10 at start of session. She was medicated prior to therapy arrival. At end of session, patient reports pain at 6/10. Pt left in bathroom on toilet. Educated to pull call light when finished. Notified nursing of patient's location.   Therapy Documentation Precautions:  Precautions Precautions: Back, Fall Precaution Comments: LSO when out of bed. Ok to donn sitting and to shower with it off. Required Braces or Orthoses: Spinal Brace Spinal Brace: Applied in sitting position Restrictions Weight Bearing Restrictions: No Pain: Pain Assessment Pain Scale: 0-10 Pain Score: 6  Pain Type: Surgical pain Pain Location: Back Pain Orientation: Lower Pain Descriptors / Indicators: Constant;Aching Pain Frequency: Constant Pain Onset: On-going Patients Stated Pain Goal: 5 Pain Intervention(s): Medication (See eMAR)   Therapy/Group: Individual Therapy   Ailene Ravel, OTR/L,CBIS  [phone]  03/10/2019, 10:22 AM

## 2019-03-10 NOTE — Progress Notes (Signed)
Physical Therapy Session Note  Patient Details  Name: Jodi Bowman MRN: [id number] Date of Birth: 1961/06/18  Today's Date: 03/10/2019 PT Individual Time: 1423-1506 PT Individual Time Calculation (min): 43 min   Short Term Goals: Week 1:  PT Short Term Goal 1 (Week 1): = LTG  Skilled Therapeutic Interventions/Progress Updates:   Pt received sitting on toilet and agreeable to therapy session. Continent of BM and bladder and performed peri-care without assistance. Donned lumbar brace. Sit<>stand toilet/w/c<>RW with close supervision for safety throughout session. Ambulated ~20ft in room using RW to perform standing hand hygiene and to sit in w/c with CGA/close supervision. Ambulated ~313ft using RW to therapy gym with CGA for safety - demonstrated narrow BOS, decreased B LE step length, and slow gait speed. Pt educated on RW safety, use of bag on RW to carry items, and decreasing fall risk in the home. Pt able to recall 3/3 spine precautions without cuing. Ascended/descended 6 (6" height) steps using B UE support on L handrail per home set-up with CGA for steadying and min cuing for sequencing. Pt educated on decreased use of B UEs to perform sit<>stand transfer per pain tolerance. Ambulated ~335ft back to room using RW with close supervision for safety. Stand pivot recliner<>EOB using RW with close supervision for safety. Doffed lumbar brace sitting EOB. Sit>supine with supervision and cuing for reverse logroll technique.  Pt educated on use of logroll technique to maintain spine precautions. Pt left supine in bed with needs in reach and bed alarm on.   Therapy Documentation Precautions:  Precautions Precautions: Back, Fall Precaution Comments: LSO when out of bed. Ok to donn sitting and to shower with it off. Required Braces or Orthoses: Spinal Brace Spinal Brace: Applied in sitting position Restrictions Weight Bearing Restrictions: No  Pain: Reports pain level of 4/10 at beginning of  session that increased to 7/10 after ambulation - pt reports pain medication administration ~15 minutes prior to therapist arrival - provided seated rest breaks throughout for pain management and performed therapy to tolerance.   Therapy/Group: Individual Therapy  Tawana Scale, PT, DPT 03/10/2019, 2:33 PM

## 2019-03-10 NOTE — Progress Notes (Signed)
Social Work  Discharge Note  The overall goal for the admission was met for:   Discharge location: [place]  Length of Stay: Yes-8 DAYS  Discharge activity level: Yes-MOD/I LEVEL WITH SOME SUPERVISION  Home/community participation: Yes  Services provided included: MD, RD, PT, OT, RN, CM, Pharmacy and SW  Financial Services: Medicare and Medicaid  Follow-up services arranged: Home Health: [facility] and Patient/Family request agency HH: REFERRAL WHILE ON ACUTE, DME: 3-in-1 commode-GOT ROLLING WALKER WHILE ON ACUTE  Comments (or additional information):PT DID WELL AND REACHED MOD/I LEVEL. HUSBAND WORKS 10-7 PM BUT THEIR DAUGHTER WILL BE THERE WITH HER.  Patient/Family verbalized understanding of follow-up arrangements: Yes  Individual responsible for coordination of the follow-up plan: [facility]  Confirmed correct DME delivered: Elease Hashimoto 03/10/2019    Elease Hashimoto

## 2019-03-10 NOTE — Progress Notes (Signed)
Occupational Therapy Session Note  Patient Details  Name: Jodi Bowman MRN: [id number] Date of Birth: 05/03/1961  Today's Date: 03/10/2019 OT Individual Time: 2919-1660 OT Individual Time Calculation (min): 73 min    Short Term Goals: Week 1:  OT Short Term Goal 1 (Week 1): na due to short LOS  Skilled Therapeutic Interventions/Progress Updates:    Patient is pleasant and cooperative.  She states that she is tired as she did not sleep well last night.  Completed SPT and toileting tasks with RW CS level.  She is able to ambulate short distances in her room with RW CS.  Reviewed various options for shower seats and tub/shower vs shower stall.  She practiced tub bench in the shower with CS and shower seat with back in simulated shower stall - she states that she has both options at home and is leaning toward using the shower stall.  Completed ambulation on unit with CS/CG A - reviewed energy conservation techniques, back precautions and pain management options with good verbal recall.  Patient completed UB conditioning exercises with moderate fatigue noted. She returned to recliner at close of session with call bell in reach and tray table ready for lunch.    Therapy Documentation Precautions:  Precautions Precautions: Back, Fall Precaution Comments: LSO when out of bed. Ok to donn sitting and to shower with it off. Required Braces or Orthoses: Spinal Brace Spinal Brace: Applied in sitting position Restrictions Weight Bearing Restrictions: No General:   Vital Signs:   Pain: Pain Assessment Pain Scale: 0-10 Pain Score: 0-No pain   Other Treatments:     Therapy/Group: Individual Therapy  Carlos Levering 03/10/2019, 12:52 PM

## 2019-03-11 ENCOUNTER — Inpatient Hospital Stay (HOSPITAL_COMMUNITY): Payer: Medicare Other | Admitting: Occupational Therapy

## 2019-03-11 ENCOUNTER — Inpatient Hospital Stay (HOSPITAL_COMMUNITY): Payer: Medicare Other | Admitting: Physical Therapy

## 2019-03-11 MED ORDER — PREGABALIN 25 MG PO CAPS
75.0000 mg | ORAL_CAPSULE | Freq: Three times a day (TID) | ORAL | Status: DC
  Administered 2019-03-11 – 2019-03-16 (×16): 75 mg via ORAL
  Filled 2019-03-11 (×16): qty 3

## 2019-03-11 NOTE — Progress Notes (Signed)
Physical Therapy Session Note  Patient Details  Name: Jodi Bowman MRN: [id number] Date of Birth: 1961-01-23  Today's Date: 03/11/2019 PT Individual Time: 1145-1225 PT Individual Time Calculation (min): 40 min   Short Term Goals: Week 1:  PT Short Term Goal 1 (Week 1): = LTG  Skilled Therapeutic Interventions/Progress Updates:    pt rec'd in bed, c/o back pain, RN made aware and meds given. Pt performs gait 300' x 2 with RW and supervision.  Pt performs standing therex 2 x 15 hip abd, hip ext, HS curls, heel raises, mini squats. Pt performs stair negotiation 4 stairs x 2 with bilat handrails with CGA.  Pt performs toileting and toilet transfers with supervision with RW.  Pt left in bed at end of session with all needs at hand.  Therapy Documentation Precautions:  Precautions Precautions: Back, Fall Precaution Comments: LSO when out of bed. Ok to donn sitting and to shower with it off. Required Braces or Orthoses: Spinal Brace Spinal Brace: Applied in sitting position Restrictions Weight Bearing Restrictions: No Pain: Pain Assessment Pain Scale: 0-10 Pain Score: 5  Pain Type: Surgical pain Pain Location: Back Pain Orientation: Lower Pain Descriptors / Indicators: Aching Pain Intervention(s): Repositioned;Ambulation/increased activity    Therapy/Group: Individual Therapy  Lexie Morini 03/11/2019, 1:25 PM

## 2019-03-11 NOTE — Progress Notes (Signed)
Occupational Therapy Session Note  Patient Details  Name: Jodi Bowman MRN: [id number] Date of Birth: [birth year]  Today's Date: 03/11/2019 OT Individual Time: 0815-0905 OT Individual Time Calculation (min): 50 min    Short Term Goals: Week 1:  OT Short Term Goal 1 (Week 1): na due to short LOS  Skilled Therapeutic Interventions/Progress Updates:    Treatment session with focus on self-care retraining and adherence to back precautions during self-care tasks.  Pt received in bed reporting need to toilet.  Pt donned LSO seated EOB with setup assist and then ambulated to bathroom with RW and CGA.  Pt completed toileting tasks with supervision.  Bathing completed from bench in shower with supervision.  Pt utilized long handled sponge when washing lower legs and feet, adhering to back precautions.  Utilized lateral leans when washing buttocks.  Completed LB dressing with increased time and use of figure 4 position when threading pant legs, pt required assistance with donning TEDS and socks this session - will benefit from further education on use of sock aid.  Ambulated to recliner and left with legs elevated and all needs in reach.  Therapy Documentation Precautions:  Precautions Precautions: Back, Fall Precaution Comments: LSO when out of bed. Ok to donn sitting and to shower with it off. Required Braces or Orthoses: Spinal Brace Spinal Brace: Applied in sitting position Restrictions Weight Bearing Restrictions: No Pain: Pain Assessment Pain Scale: 0-10 Pain Score: 4  Pain Type: Surgical pain Pain Location: Back Pain Orientation: Lower Pain Descriptors / Indicators: Aching Pain Intervention(s): Medication (See eMAR)   Therapy/Group: Individual Therapy  Simonne Come 03/11/2019, 10:18 AM

## 2019-03-11 NOTE — Plan of Care (Signed)
  Problem: Consults Goal: RH GENERAL PATIENT EDUCATION Description: See Patient Education module for education specifics. Outcome: Progressing Goal: Skin Care Protocol Initiated - if Braden Score 18 or less Description: If consults are not indicated, leave blank or document N/A Outcome: Progressing   Problem: RH BOWEL ELIMINATION Goal: RH STG MANAGE BOWEL WITH ASSISTANCE Description: STG Manage Bowel with [place].  Outcome: Progressing Goal: RH STG MANAGE BOWEL W/MEDICATION W/ASSISTANCE Description: STG Manage Bowel with Medication with min Assistance.  Outcome: Progressing   Problem: RH BLADDER ELIMINATION Goal: RH STG MANAGE BLADDER WITH ASSISTANCE Description: STG Manage Bladder With Min Assistance  Outcome: Progressing Goal: RH STG MANAGE BLADDER WITH EQUIPMENT WITH ASSISTANCE Description: STG Manage Bladder With Equipment With min Assistance  Outcome: Progressing   Problem: RH SKIN INTEGRITY Goal: RH STG SKIN FREE OF INFECTION/BREAKDOWN Description: Pt will be free of skin breakdown during stay with min assist   Outcome: Progressing Goal: RH STG MAINTAIN SKIN INTEGRITY WITH ASSISTANCE Description: STG Maintain Skin Integrity With [place].  Outcome: Progressing Goal: RH STG ABLE TO PERFORM INCISION/WOUND CARE W/ASSISTANCE Description: STG Able To Perform Incision/Wound Care With min Assistance.  Outcome: Progressing   Problem: RH SAFETY Goal: RH STG ADHERE TO SAFETY PRECAUTIONS W/ASSISTANCE/DEVICE Description: STG Adhere to Safety Precautions With Min Assistance/Device.  Outcome: Progressing   Problem: RH PAIN MANAGEMENT Goal: RH STG PAIN MANAGED AT OR BELOW PT'S PAIN GOAL Description: Pain free or pain less than 3   Outcome: Progressing   Problem: RH KNOWLEDGE DEFICIT GENERAL Goal: RH STG INCREASE KNOWLEDGE OF SELF CARE AFTER HOSPITALIZATION Description: Pt will verbalize ways to prevent falls and home safety during admission  Outcome: Progressing

## 2019-03-11 NOTE — Progress Notes (Signed)
Occupational Therapy Session Note  Patient Details  Name: Jodi Bowman MRN: [id number] Date of Birth: [birth year]  Today's Date: 03/11/2019 OT Individual Time: 1000-1110 OT Individual Time Calculation (min): 70 min    Short Term Goals: Week 1:  OT Short Term Goal 1 (Week 1): na due to short LOS  Skilled Therapeutic Interventions/Progress Updates:    Patient seated in recliner, she states that she did not sleep well last night but is agreeable to therapy session.  Functional mobility with RW and SPT to/from recliner, arm chair, w/c, bed with CS.  Reviewed and practiced basic reach and transport of items in the kitchen environment.  She is able to manage walker in tight spaces and simulated dish washing and basic HM tasks with good safety at S level using RW.  Reviewed energy conservation techniques and back safety with good carryover.  Completed UB conditioning activities with fair tolerance due to fatigue.  Completed 200+ ft walk with RW S level.  She returned to bed at close of session for rest break.  Bed alarm set and callbell in reach  Therapy Documentation Precautions:  Precautions Precautions: Back, Fall Precaution Comments: LSO when out of bed. Ok to donn sitting and to shower with it off. Required Braces or Orthoses: Spinal Brace Spinal Brace: Applied in sitting position Restrictions Weight Bearing Restrictions: No General:   Vital Signs:   Pain: Pain Assessment Pain Scale: 0-10 Pain Score: 5  Pain Type: Surgical pain Pain Location: Back Pain Orientation: Lower Pain Descriptors / Indicators: Aching Pain Intervention(s): Repositioned;Ambulation/increased activity Other Treatments:     Therapy/Group: Individual Therapy  Carlos Levering 03/11/2019, 11:27 AM

## 2019-03-11 NOTE — Progress Notes (Signed)
[place] PHYSICAL MEDICINE & REHABILITATION PROGRESS NOTE   Subjective/Complaints: Patient continues have lower extremity pain.  Reviewed Doppler venous ultrasound, some numbness in left foot.  Urinary frequency.  Denies any drowsiness from medication changes, no leg swelling no blurring of vision  Review of systems denies chest pain shortness of breath nausea vomiting diarrhea or constipation.   Objective:   Vas [place] Lower Extremity Venous (dvt)  Result Date: 03/09/2019  Lower Venous Study Indications: Swelling.  Performing Technologist: Maudry Mayhew MHA, RDMS, RVT, RDCS  Examination Guidelines: A complete evaluation includes B-mode imaging, spectral Doppler, color Doppler, and power Doppler as needed of all accessible portions of each vessel. Bilateral testing is considered an integral part of a complete examination. Limited examinations for reoccurring indications may be performed as noted.  +---------+---------------+---------+-----------+----------+-------+ RIGHT    CompressibilityPhasicitySpontaneityPropertiesSummary +---------+---------------+---------+-----------+----------+-------+ CFV      Full           Yes      Yes                          +---------+---------------+---------+-----------+----------+-------+ SFJ      Full                                                 +---------+---------------+---------+-----------+----------+-------+ FV Prox  Full                                                 +---------+---------------+---------+-----------+----------+-------+ FV Mid   Full                                                 +---------+---------------+---------+-----------+----------+-------+ FV DistalFull                                                 +---------+---------------+---------+-----------+----------+-------+ PFV      Full                                                  +---------+---------------+---------+-----------+----------+-------+ POP      Full           Yes      Yes                          +---------+---------------+---------+-----------+----------+-------+ PTV      Full                                                 +---------+---------------+---------+-----------+----------+-------+ PERO     Full                                                 +---------+---------------+---------+-----------+----------+-------+   +---------+---------------+---------+-----------+----------+-------+  LEFT     CompressibilityPhasicitySpontaneityPropertiesSummary +---------+---------------+---------+-----------+----------+-------+ CFV      Full           Yes      Yes                          +---------+---------------+---------+-----------+----------+-------+ SFJ      Full                                                 +---------+---------------+---------+-----------+----------+-------+ FV Prox  Full                                                 +---------+---------------+---------+-----------+----------+-------+ FV Mid   Full                                                 +---------+---------------+---------+-----------+----------+-------+ FV DistalFull                                                 +---------+---------------+---------+-----------+----------+-------+ PFV      Full                                                 +---------+---------------+---------+-----------+----------+-------+ POP      Full           Yes      Yes                          +---------+---------------+---------+-----------+----------+-------+ PTV      Full                                                 +---------+---------------+---------+-----------+----------+-------+ PERO     Full                                                 +---------+---------------+---------+-----------+----------+-------+     Summary:  Right: There is no evidence of deep vein thrombosis in the lower extremity. No cystic structure found in the popliteal fossa. Left: There is no evidence of deep vein thrombosis in the lower extremity. No cystic structure found in the popliteal fossa.  *See table(s) above for measurements and observations. Electronically signed by Curt Jews MD on 03/09/2019 at 4:58:13 PM.    Final    Recent Labs    03/09/19 0707  WBC 8.4  HGB 9.1*  HCT 28.3*  PLT 212   Recent Labs    03/09/19 0707  NA 139  K 4.1  CL 104  CO2 29  GLUCOSE 88  BUN 8  CREATININE 0.72  CALCIUM 8.7*    Intake/Output Summary (Last 24 hours) at 03/11/2019 0729 Last data filed at 03/11/2019 0253 Gross per 24 hour  Intake 600 ml  Output -  Net 600 ml     Physical Exam: Vital Signs Blood pressure 119/63, pulse 72, temperature 98.2 F (36.8 C), temperature source Oral, resp. rate 18, height 4\' 11"  (1.499 m), weight 70.6 kg, SpO2 98 %.   General: No acute distress Mood and affect are appropriate Heart: Regular rate and rhythm no rubs murmurs or extra sounds Lungs: Clear to auscultation, breathing unlabored, no rales or wheezes Abdomen: Positive bowel sounds, soft nontender to palpation, nondistended Extremities: No clubbing, cyanosis, or edema Skin: No evidence of breakdown, no evidence of rash, lumbar incision well-healed without drainage Neurologic: Cranial nerves II through XII intact, motor strength is 5/5 in bilateral deltoid, bicep, tricep, grip,4/5 hip flexor, knee extensors, ankle dorsiflexor and plantar flexor Sensory exam normal sensation to light touch  lower extremities, patient states that the left foot feels a little different than the right foot but is able to identify which toe is touched  Musculoskeletal: Full range of motion in all 4 extremities. No joint swelling   Assessment/Plan: 1. Functional deficits secondary to lumbar spinal stenosis with bilateral lower extremity radiculopathy status post  fusion 03/04/2019 which require 3+ hours per day of interdisciplinary therapy in a comprehensive inpatient rehab setting.  Physiatrist is providing close team supervision and 24 hour management of active medical problems listed below.  Physiatrist and rehab team continue to assess barriers to discharge/monitor patient progress toward functional and medical goals  Care Tool:  Bathing    Body parts bathed by patient: Right arm, Left arm, Chest, Abdomen, Front perineal area, Buttocks, Right upper leg, Left upper leg, Right lower leg, Left lower leg, Face   Body parts bathed by helper: Front perineal area, Buttocks     Bathing assist Assist Level: Supervision/Verbal cueing     Upper Body Dressing/Undressing Upper body dressing   What is the patient wearing?: Pull over shirt, Bra    Upper body assist Assist Level: Supervision/Verbal cueing    Lower Body Dressing/Undressing Lower body dressing      What is the patient wearing?: Underwear/pull up, Pants     Lower body assist Assist for lower body dressing: Supervision/Verbal cueing     Toileting Toileting    Toileting assist Assist for toileting: Supervision/Verbal cueing     Transfers Chair/bed transfer  Transfers assist     Chair/bed transfer assist level: Supervision/Verbal cueing     Locomotion Ambulation   Ambulation assist      Assist level: Supervision/Verbal cueing Assistive device: Walker-rolling Max distance: 356ft   Walk 10 feet activity   Assist     Assist level: Supervision/Verbal cueing Assistive device: Walker-rolling   Walk 50 feet activity   Assist    Assist level: Supervision/Verbal cueing Assistive device: Walker-rolling    Walk 150 feet activity   Assist Walk 150 feet activity did not occur: Safety/medical concerns  Assist level: Supervision/Verbal cueing Assistive device: Walker-rolling    Walk 10 feet on uneven surface  activity   Assist Walk 10 feet on uneven  surfaces activity did not occur: Safety/medical concerns         Wheelchair     Assist Will patient use wheelchair at discharge?: No(TBD but do not anticipate will need)  Wheelchair 50 feet with 2 turns activity    Assist            Wheelchair 150 feet activity     Assist          Medical Problem List and Plan: 1.Decreased functional mobilitysecondary to lumbar stenosis with radiculopathy. Status post L4-L5-S1 decompression posterior lumbar interbody fusion 03/04/2019. Back brace when out of bed -CIR level PT OT 2. Antithrombotics: -DVT/anticoagulation:SCDs. Negative vascular study -antiplatelet therapy: Aspirin 81 mg daily 3. Pain Management:Cymbalta 30 mg daily, Lyrica 25 mg 3 times daily, hydrocodoneas needed.  Has fibromyalgia syndrome which will complicate postoperative pain management -having ongoing radicular pain.  Increase Lyrica to 75 mg 3 times daily, monitor for lethargy  4. Mood:Provide emotional support -antipsychotic agents: N/A 5. Neuropsych: This patientiscapable of making decisions on herown behalf. 6. Skin/Wound Care:Routine skin checks 7. Fluids/Electrolytes/Nutrition:Routine in and outs with follow-up chemistries 8.Acute blood loss anemia. Follow-up CBC 9. Transient orthostasis improved. Norvasc 5 mg daily, midamor5 mg daily, Cozaar 100 mg daily, Toprol-XL 50 mg daily. Monitor with increased mobility Vitals:   03/10/19 2013 03/11/19 0253  BP: 122/70 119/63  Pulse: 73 72  Resp: 17 18  Temp: 98.3 F (36.8 C) 98.2 F (36.8 C)  SpO2: 99% 98%  Controlled, 03/11/2019 10. Urinary retention. Improving. Routine toileting schedule -Recheck PVR's x 3, discussed with nursing I/C as needed 11. Constipation. Laxative assistance    LOS: 3 days A FACE TO FACE EVALUATION WAS PERFORMED  Charlett Blake 03/11/2019, 7:29 AM

## 2019-03-11 NOTE — Progress Notes (Signed)
Occupational Therapy Session Note  Patient Details  Name: Jodi Bowman MRN: [id number] Date of Birth: 1960-11-28  Today's Date: 03/11/2019 OT Individual Time: 1435-1500 OT Individual Time Calculation (min): 25 min    Short Term Goals: Week 1:  OT Short Term Goal 1 (Week 1): na due to short LOS  Skilled Therapeutic Interventions/Progress Updates:    Treatment session with focus on toilet transfers and d/c planning. Pt received supine in bed agreeable to therapy session.  Educated on various techniques to increase independence with donning TEDS and socks, with plan to further assess and incorporate during next therapy session as too difficult to remove and reapply TEDS due to pt wearing leggings.  Pt reports need to toilet.  Applied LSO seated at EOB and ambulated to bathroom with RW with supervision.  Pt completed all toileting tasks with supervision.  Discussed options of DME for shower with pt expressing desire to have husband purchase shower chair independently.  Pt returned to sidelying in bed and left with all needs in reach.  Therapy Documentation Precautions:  Precautions Precautions: Back, Fall Precaution Comments: LSO when out of bed. Ok to donn sitting and to shower with it off. Required Braces or Orthoses: Spinal Brace Spinal Brace: Applied in sitting position Restrictions Weight Bearing Restrictions: No General:   Vital Signs: Therapy Vitals Temp: 97.7 F (36.5 C) Temp Source: Oral Pulse Rate: 77 Resp: 17 BP: (!) 103/58 Patient Position (if appropriate): Lying Oxygen Therapy SpO2: 100 % O2 Device: Room Air Pain: Pain Assessment Pain Scale: 0-10 Pain Score: 5  Pain Type: Surgical pain Pain Location: Back Pain Orientation: Lower Pain Descriptors / Indicators: Aching Pain Intervention(s): Repositioned;Ambulation/increased activity   Therapy/Group: Individual Therapy  Simonne Come 03/11/2019, 3:13 PM

## 2019-03-12 ENCOUNTER — Inpatient Hospital Stay (HOSPITAL_COMMUNITY): Payer: Medicare Other

## 2019-03-12 ENCOUNTER — Inpatient Hospital Stay (HOSPITAL_COMMUNITY): Payer: Medicare Other | Admitting: Occupational Therapy

## 2019-03-12 NOTE — Progress Notes (Signed)
Occupational Therapy Session Note  Patient Details  Name: Jodi Bowman MRN: [id number] Date of Birth: 04-28-1961  Today's Date: 03/12/2019 OT Individual Time: 5643-3295 OT Individual Time Calculation (min): 55 min    Short Term Goals: Week 1:  OT Short Term Goal 1 (Week 1): na due to short LOS  Skilled Therapeutic Interventions/Progress Updates:    Treatment session with focus on functional mobility during self-care tasks and LB dressing.  Pt received supine in bed reporting increased pain in back and legs due to spasms, RN aware and premedicated.  Pt declined shower but agreeable to bathing at sink.  Pt completed bed mobility with supervision and donned LSO with setup assist.  Pt ambulated to sink with RW with supervision.  Engaged in bathing and dressing with setup assist and good recall of back precautions during self-care tasks. Pt completed LB dressing seated EOB with cues to attempt donning TEDS this session.  Utilized plastic foot piece with pt able to don TEDS without assistance and then donned socks in figure 4 position as well.  Pt set up in recliner for breakfast.  Discussed d/c plan with pt reporting feeling prepared for upcoming d/c, only concerns being medical which she voiced to MD this AM.  Pt left upright in recliner with chair alarm on and all needs in reach.  Therapy Documentation Precautions:  Precautions Precautions: Back, Fall Precaution Comments: LSO when out of bed. Ok to donn sitting and to shower with it off. Required Braces or Orthoses: Spinal Brace Spinal Brace: Applied in sitting position Restrictions Weight Bearing Restrictions: No Pain: Pain Assessment Pain Scale: 0-10 Pain Score: 7 Pain Type: Surgical pain Pain Location: Back Pain Descriptors / Indicators: Discomfort Pain Intervention(s): Premedicated   Therapy/Group: Individual Therapy  Jodi Bowman 03/12/2019, 8:33 AM

## 2019-03-12 NOTE — Progress Notes (Signed)
Physical Therapy Session Note  Patient Details  Name: Jodi Bowman MRN: [id number] Date of Birth: April 12, 1961  Today's Date: 03/12/2019 PT Individual Time: 1000-1059 and 2094-7096 PT Individual Time Calculation (min): 59 min and 70 min   Short Term Goals: Week 1:  PT Short Term Goal 1 (Week 1): = LTG  Skilled Therapeutic Interventions/Progress Updates:   Session 1:  Pt seated in recliner upon PT arrival, agreeable to therapy tx and reports pain 5/10 in low back, pain relief with rest breaks and positioning this session. Pt donned shoes while sitting with supervision. Pt ambulated to the therapy mat x 50 ft with RW and supervision. Pt worked on standing balance without UE support to participate in ball toss activity, x 2 trials. Pt worked on LE strengthening this session to perform 2 x 5 sit<>stands without UE support, cues for techniques. Pt worked on gait and endurance in order to ambulate x 250 ft and then x 500 ft this session with RW and supervision. Pt worked on standing balance to perform toe taps on aerobic step, x 2 trials with min assist for balance. Pt performed standing hip abduction exercise with UE support at rail 2 x 10 for hip abductor strengthening, cues for techniques. Pt ambulated x 60 ft to the nustep, transferred on/off with supervision this session and used nustep x 6 minutes on workload 5 for global strengthening and endurance. Pt ambulated back to room and left seated in recliner with needs in reach and chair alarm set.   Session 2: Pt supine in bed upon PT arrival, agreeable to therapy tx and reports pain 5/10 in low back, RN present to administer pain medicine. Pt transferred to sitting with supervision, ambulated to w/c with RW and supervision x 10 ft. Pt transported to the gym this session. Pt ambulated x 300 ft throughout unit this session with RW and supervision, cues for upright posture. Pt worked dynamic standing balance this session without AD in order to perform  the following: standing on airex to toss horseshoes x 2 trials, standing on airex while completing peg board puzzle, forwards/backwards ambulation without AD 3 x 10 ft, gait without AD x 80 ft, four square stepping in place both directions, ambulating weaving through cones without AD x 2 trials, and toe taps on 6 inch step. Pt ambulated x 150 ft to the ortho gym and performed the car transfer this session with RW and supervision. Pt ambulated x 60 ft to the rehab apartment and performed furniture transfers this session on the recliner and on the couch, both with RW and supervision. Pt instructed in seated hip abduction exercises with orange theraband, 2 x 10 with cues for techniques. Pt transported back to room and transferred to bed with supervision, left supine with needs in reach and bed alarm set.   Therapy Documentation Precautions:  Precautions Precautions: Back, Fall Precaution Comments: LSO when out of bed. Ok to donn sitting and to shower with it off. Required Braces or Orthoses: Spinal Brace Spinal Brace: Applied in sitting position Restrictions Weight Bearing Restrictions: No    Therapy/Group: Individual Therapy  Netta Corrigan, PT, DPT 03/12/2019, 7:51 AM

## 2019-03-12 NOTE — Progress Notes (Signed)
Jodi Bowman PHYSICAL MEDICINE & REHABILITATION PROGRESS NOTE   Subjective/Complaints: Patient has some right leg discomfort.  Seems to be somewhat better.  She also states that she has required 1 in and out catheterization.   Objective:     Physical Exam: Vital Signs Blood pressure 122/60, pulse 65, temperature 97.6 F (36.4 C), temperature source Oral, resp. rate 16, height 4\' 11"  (1.499 m), weight 70.6 kg, SpO2 99 %.  No acute distress Chest clear to auscultation Cardiac exam S1 and S2 are regular Abdominal exam active bowel sounds, soft, nontender Extremities without edema.   Medical Problem List and Plan: 1.Decreased functional mobilitysecondary to lumbar stenosis with radiculopathy. Status post L4-L5-S1 decompression posterior lumbar interbody fusion 03/04/2019. Back brace when out of bed -CIR level PT OT 2. Antithrombotics: -DVT/anticoagulation:SCDs. Negative vascular study -antiplatelet therapy: Aspirin 81 mg daily 3. Pain Management:Continues to have some discomfort in the right leg.  We will continue current medications. 4.  Mood 5. Neuropsych: This patientiscapable of making decisions on herown behalf. 6. Skin/Wound Care:Routine skin checks 7. Fluids/Electrolytes/Nutrition:Routine in and outs with follow-up chemistries 8.Acute blood loss anemia. Follow-up CBC Lab Results  Component Value Date   HGB 9.1 (L) 03/09/2019   9. Transient orthostasis improved.  10. Urinary retention. Improving. We will continued with postvoid residuals. 11. Constipation. Laxative assistance    LOS: 4 days A FACE TO FACE EVALUATION WAS PERFORMED  Jodi Bowman H Jodi Bowman 03/12/2019, 10:15 AM

## 2019-03-13 LAB — BASIC METABOLIC PANEL
Anion gap: 7 (ref 5–15)
BUN: 19 mg/dL (ref 6–20)
CO2: 26 mmol/L (ref 22–32)
Calcium: 8.7 mg/dL — ABNORMAL LOW (ref 8.9–10.3)
Chloride: 104 mmol/L (ref 98–111)
Creatinine, Ser: 1.07 mg/dL — ABNORMAL HIGH (ref 0.44–1.00)
GFR calc Af Amer: >60 mL/min (ref >60)
GFR calc non Af Amer: 57 mL/min — ABNORMAL LOW (ref >60)
Glucose, Bld: 82 mg/dL (ref 70–99)
Potassium: 4.7 mmol/L (ref 3.5–5.1)
Sodium: 137 mmol/L (ref 135–145)

## 2019-03-13 LAB — TROPONIN I: Troponin I: <0.03 ng/mL (ref <0.03)

## 2019-03-13 LAB — D-DIMER, QUANTITATIVE: D-Dimer, Quant: 2.72 ug/mL-FEU — ABNORMAL HIGH (ref 0.00–0.50)

## 2019-03-13 MED ORDER — MORPHINE SULFATE (PF) 2 MG/ML IV SOLN
2.0000 mg | INTRAVENOUS | Status: DC | PRN
  Administered 2019-03-13 – 2019-03-15 (×2): 2 mg via INTRAVENOUS
  Filled 2019-03-13 (×2): qty 1

## 2019-03-13 MED ORDER — ALUM & MAG HYDROXIDE-SIMETH 200-200-20 MG/5ML PO SUSP
30.0000 mL | ORAL | Status: DC | PRN
  Administered 2019-03-13: 30 mL via ORAL
  Filled 2019-03-13: qty 30

## 2019-03-13 NOTE — Significant Event (Signed)
Rapid Response Event Note  Overview:Called d/t chest pain starting at [name] upon returned from bathroom, pt received ntg SL x 2 prior to call. MD ordered EKG, trop, D-Dimer, BMET, Mylanta, and morphine Time Called: 1854 Arrival Time: 1910 Event Type: Cardiac  Initial Focused Assessment: Pt laying in bed, alert and oriented, endorsed 8/10 R  Substernal chest pain.  Pt looks uncomfortable. Lungs diminished. Skin warm and dry, palpable pulses present. SBP-90s after ntg X 2, HR-78, SpO2-oo% on RA, RR-22.   Interventions: NTG SL x 2 PTA RRT. EKG PTA RRT-appears NSR 22g PIV started  2mg  morphine given, resulting in decreased chest 6/10 IVT RN at bedside, requested 20g PIV if possible. Plan of Care (if not transferred): Continue to monitor pt, notify MD of abnormal labs and update as to pts condition. Call RRT if further assistance needed  Update: 1945-trop <0.03, D-dimer-2.72, MD updated by bedside RN. Plan: monitor Event Summary: Name of Physician Notified: Plotnikov at (PTA RRT)    at    Outcome: Stayed in room and stabalized  Event End Time: 1940  Jodi Bowman

## 2019-03-13 NOTE — Plan of Care (Signed)
  Problem: Consults Goal: RH GENERAL PATIENT EDUCATION Description: See Patient Education module for education specifics. Outcome: Progressing Goal: Skin Care Protocol Initiated - if Braden Score 18 or less Description: If consults are not indicated, leave blank or document N/A Outcome: Progressing   Problem: RH BOWEL ELIMINATION Goal: RH STG MANAGE BOWEL WITH ASSISTANCE Description: STG Manage Bowel with [place].  Outcome: Progressing   Problem: RH BLADDER ELIMINATION Goal: RH STG MANAGE BLADDER WITH ASSISTANCE Description: STG Manage Bladder With Min Assistance  Outcome: Progressing   Problem: RH SKIN INTEGRITY Goal: RH STG SKIN FREE OF INFECTION/BREAKDOWN Description: Pt will be free of skin breakdown during stay with min assist   Outcome: Progressing Goal: RH STG MAINTAIN SKIN INTEGRITY WITH ASSISTANCE Description: STG Maintain Skin Integrity With [place].  Outcome: Progressing Goal: RH STG ABLE TO PERFORM INCISION/WOUND CARE W/ASSISTANCE Description: STG Able To Perform Incision/Wound Care With min Assistance.  Outcome: Progressing   Problem: RH SAFETY Goal: RH STG ADHERE TO SAFETY PRECAUTIONS W/ASSISTANCE/DEVICE Description: STG Adhere to Safety Precautions With Min Assistance/Device.  Outcome: Progressing   Problem: RH PAIN MANAGEMENT Goal: RH STG PAIN MANAGED AT OR BELOW PT'S PAIN GOAL Description: Pain free or pain less than 3   Outcome: Progressing   Problem: RH KNOWLEDGE DEFICIT GENERAL Goal: RH STG INCREASE KNOWLEDGE OF SELF CARE AFTER HOSPITALIZATION Description: Pt will verbalize ways to prevent falls and home safety during admission  Outcome: Progressing

## 2019-03-13 NOTE — Progress Notes (Signed)
Pt c/o chest pain and heaviness with SOB. VSS. Nitro SL given x 1. Nurse at bedside to monitor.  Erie Noe, RN 03/13/19 6:37 PM

## 2019-03-13 NOTE — Progress Notes (Signed)
On-call note:   Earlier I was called with c/o mid-CP 8/10 in intensity not relieved with two NTG.  Vital signs were stable.  There had no signs of respiratory distress, no diaphoresis, etc. EKG was normal.  D-dimer was elevated compared to previous.  Troponin level is pending  Assessment and plan: Central chest pain of unclear etiology.  We gave the patient 2 mg of IV morphine sulfate and an antacid.  She is on aspirin.  SCD for DVT prophylaxis.  EKG was normal.  Venous Doppler ultrasound of her lower extremities was negative for DVT on 03/09/2019.  Within a year she had two negative chest CT angiograms- one in March 2019 and one in January 2020.  D-dimer could be elevated due to her recent back surgery.  We will continue to observe.

## 2019-03-13 NOTE — Progress Notes (Signed)
Jodi Bowman   Subjective/Complaints: Patient feels well.  Still has some back discomfort.  She still has some paresthesias of both lower extremities.  She also feels like when she urinates she does not void completely.  I spoke with the nurses.  They state that postvoid residuals have been next to nothing.  Objective:  Physical Exam: Vital Signs Blood pressure (!) 103/50, pulse 63, temperature (!) 97.4 F (36.3 C), temperature source Oral, resp. rate 14, height 4\' 11"  (1.499 m), weight 70.6 kg, SpO2 100 %.   Well-developed well-nourished female in no acute distress. HEENT exam atraumatic, normocephalic, extraocular muscles are intact. Neck is supple. No jugular venous distention no thyromegaly. Chest clear to auscultation without increased work of breathing. Cardiac exam S1 and S2 are regular. Abdominal exam active bowel sounds, soft, nontender. Extremities no edema. Neurologic exam she is alert    Medical Problem List and Plan: 1.Decreased functional mobilitysecondary to lumbar stenosis with radiculopathy. Status post L4-L5-S1 decompression posterior lumbar interbody fusion 03/04/2019. Back brace when out of bed -CIR level PT OT 2. Antithrombotics: -DVT/anticoagulation:SCDs. Negative vascular study -antiplatelet therapy: Aspirin 81 mg daily 3. Pain Management:Continues to have some discomfort in the right leg.  We will continue current medications. 4.  Mood 5. Neuropsych: This patientiscapable of making decisions on herown behalf. 6. Skin/Wound Care:Routine skin checks 7. Fluids/Electrolytes/Nutrition:Routine in and outs with follow-up chemistries 8.Acute blood loss anemia. Follow-up CBC Lab Results  Component Value Date   HGB 9.1 (L) 03/09/2019   9. Transient orthostasis improved.  10. Urinary retention. Improving.really no need fo rPVRs if documented previously 11. Constipation.  Laxative assistance    LOS: 5 days A FACE TO FACE EVALUATION WAS PERFORMED  Mckinsey Keagle H Jocob Dambach 03/13/2019, 7:24 AM

## 2019-03-13 NOTE — Progress Notes (Addendum)
Nitro ineffective, MD called and orders placed. RR RN notified as well.  Nicholes Rough, LPN

## 2019-03-14 ENCOUNTER — Inpatient Hospital Stay (HOSPITAL_COMMUNITY): Payer: Medicare Other | Admitting: Physical Therapy

## 2019-03-14 ENCOUNTER — Inpatient Hospital Stay (HOSPITAL_COMMUNITY): Payer: Medicare Other | Admitting: Occupational Therapy

## 2019-03-14 DIAGNOSIS — G894 Chronic pain syndrome: Secondary | ICD-10-CM

## 2019-03-14 DIAGNOSIS — I951 Orthostatic hypotension: Secondary | ICD-10-CM

## 2019-03-14 DIAGNOSIS — R339 Retention of urine, unspecified: Secondary | ICD-10-CM

## 2019-03-14 DIAGNOSIS — N179 Acute kidney failure, unspecified: Secondary | ICD-10-CM

## 2019-03-14 DIAGNOSIS — D62 Acute posthemorrhagic anemia: Secondary | ICD-10-CM

## 2019-03-14 DIAGNOSIS — M797 Fibromyalgia: Secondary | ICD-10-CM

## 2019-03-14 LAB — TROPONIN I
Troponin I: <0.03 ng/mL (ref <0.03)
Troponin I: <0.03 ng/mL (ref <0.03)

## 2019-03-14 NOTE — Progress Notes (Signed)
Physical Therapy Session Note  Patient Details  Name: Jodi Bowman MRN: [id number] Date of Birth: 1961-03-18  Today's Date: 03/14/2019 PT Individual Time: 1410-1505 PT Individual Time Calculation (min): 55 min   Short Term Goals: Week 1:  PT Short Term Goal 1 (Week 1): = LTG  Skilled Therapeutic Interventions/Progress Updates:    pt requesting to use bathroom. Pt able to perform all toileting and transfers with RW and mod I.  Pt performs gait throughout unit including up to 500' with RW and supervision.  Gait with obstacle negotiation around and over obstacles with supervision.  Picking up items from floor with reacher from standing position with supervision.  Standing tap ups with 1 UE support 2 x 10 bilat.  Step ups to 4'' step 2 x 10 bilat.  Standing on foam horseshoe toss with 1 UE support with supervision.  Pt left in room with all needs at hand.  Therapy Documentation Precautions:  Precautions Precautions: Back, Fall Precaution Comments: LSO when out of bed. Ok to donn sitting and to shower with it off. Required Braces or Orthoses: Spinal Brace Spinal Brace: Applied in sitting position Restrictions Weight Bearing Restrictions: No Pain: Pt c/o "soreness", meds given prior to session   Therapy/Group: Individual Therapy  DONAWERTH,KAREN 03/14/2019, 3:13 PM

## 2019-03-14 NOTE — Progress Notes (Signed)
Occupational Therapy Session Note  Patient Details  Name: Jodi Bowman MRN: [id number] Date of Birth: 1961-08-05  Today's Date: 03/14/2019 OT Individual Time: 4650-3546 OT Individual Time Calculation (min): 40 min    Short Term Goals: Week 1:  OT Short Term Goal 1 (Week 1): na due to short LOS  Skilled Therapeutic Interventions/Progress Updates:    Treatment session with focus on functional mobility and endurance.  Pt received supine in bed agreeable to therapy session. Pt reports need to toilet.  Donned LSO seated EOB and ambulated to toilet with RW with supervision.  Pt completed toileting at overall mod I level this session.  Discussed 3 in 1 for home to allow for elevated commode and hand placement for ease with sit <> stand - pt agreeable to 3 in 1 for home.  Ambulated to therapy gym with RW with supervision.  Engaged in 2 sets of 10 chest pulls and PNF pattern reaching with theraband with no reports of increased pain with activity.  Ambulated around cones with and without RW with focus on endurance and functional mobility to simulate home environments.  Pt required min guard without RW and distant supervision with RW.  Discussed safety concerns without RW as well as use of RW for endurance/energy conservation.  Pt returned to room and transferred back to bed, left in sidelying with all needs in reach.  Therapy Documentation Precautions:  Precautions Precautions: Back, Fall Precaution Comments: LSO when out of bed. Ok to donn sitting and to shower with it off. Required Braces or Orthoses: Spinal Brace Spinal Brace: Applied in sitting position Restrictions Weight Bearing Restrictions: No Pain: Pain Assessment Pain Scale: 0-10 Pain Score: 2 Pain Type: Surgical pain Pain Location: Back   Therapy/Group: Individual Therapy  Simonne Come 03/14/2019, 3:04 PM

## 2019-03-14 NOTE — Progress Notes (Signed)
Occupational Therapy Session Note  Patient Details  Name: Jodi Bowman MRN: [id number] Date of Birth: [birth year]  Today's Date: 03/14/2019 OT Individual Time: 0315-9458 OT Individual Time Calculation (min): 75 min    Short Term Goals: Week 1:  OT Short Term Goal 1 (Week 1): na due to short LOS  Skilled Therapeutic Interventions/Progress Updates:    Treatment session with focus on ADL retraining and adherence to back precautions during bathing and dressing tasks.  Pt received upright in bed agreeable to shower.  Pt gathered clothing prior to shower.  Close supervision stand pivot transfer to shower bench in room shower.  Pt completed bathing at overall Mod I level with good awareness of back precautions.  Discussed set up of items in shower at home with discussion of modifications to increase safety and adherence to back precautions when reaching to obtain items.  Pt completed dressing with use of figure 4 position when donning TEDS, socks, and leggings. Pt is able to don LSO without assistance.  Grooming tasks completed in standing at sink.  Pt ambulated >300' with RW with supervision to therapy gym to complete simulated shower transfer.  Utilized 3" ledge on shower box with pt able to step over ledge with backwards stepping and then sitting on seat and pivoting with supervision.  Returned to room as above and left upright in recliner with legs elevated and all needs in reach.  Therapy Documentation Precautions:  Precautions Precautions: Back, Fall Precaution Comments: LSO when out of bed. Ok to donn sitting and to shower with it off. Required Braces or Orthoses: Spinal Brace Spinal Brace: Applied in sitting position Restrictions Weight Bearing Restrictions: No General:   Vital Signs: Therapy Vitals Temp: 98.3 F (36.8 C) Temp Source: Oral Pulse Rate: 76 Resp: 18 BP: 108/60 Patient Position (if appropriate): Lying Oxygen Therapy SpO2: 100 % O2 Device: Room Air Pain: Pain  Assessment Pain Scale: 0-10 Pain Score: 5  Pain Type: Surgical pain Pain Location: Back Pain Orientation: Lower Pain Descriptors / Indicators: Aching Pain Frequency: Constant Pain Onset: On-going Patients Stated Pain Goal: 2 Pain Intervention(s): Medication (See eMAR);Repositioned;Emotional support   Therapy/Group: Individual Therapy  Simonne Come 03/14/2019, 9:05 AM

## 2019-03-14 NOTE — Progress Notes (Signed)
Notified MD the D-Dimer result. No new order was obtained. Will continue monitoring the pt.

## 2019-03-14 NOTE — Progress Notes (Signed)
Physical Therapy Session Note  Patient Details  Name: [name] MRN: [id number] Date of Birth: 06-06-1961  Today's Date: 03/14/2019 PT Individual Time: 1030-1056 PT Individual Time Calculation (min): 26 min   Short Term Goals: Week 1:  PT Short Term Goal 1 (Week 1): = LTG  Skilled Therapeutic Interventions/Progress Updates:  Pt in recliner on arrival to room. Agreeable to PT at this time. Does need to use bathroom first.   Supervision with increased time to stand from recliner to RW. Supervision for walking into/around/out of bathroom with pt independent with clothing management and peri care. Pt then ambulated into hallway to swivel chair at table. Seated in chair pt performed the following with bil LE's: level 2 resisted hamstring curls, 3# ankle weights for long arc quads, both 2 sets of 10 reps with cues for form and to slow down movements. Pt the ambulated back to room with RW with supervision. Seated at edge of recliner pt performed 5 reps of sit<>stands with no UE assist. Pt the ambulated 5 feet no AD to edge of bed. Pt independent with doffing brace at edge of bed. Supervision with reminder cues on technique to lie down on left side and roll to supine so to adhere to back precautions (pt initially attempting to lie straight back into the bed.   Pt left in right side lying with all needs in reach and HOB 20 degrees. Bed alarm on as well.   Therapy Documentation Precautions:  Precautions Precautions: Back, Fall Precaution Comments: LSO when out of bed. Ok to donn sitting and to shower with it off. Required Braces or Orthoses: Spinal Brace Spinal Brace: Applied in sitting position Restrictions Weight Bearing Restrictions: No    Therapy/Group: Individual Therapy  Willow Ora, PTA 03/14/2019, 4:45 PM

## 2019-03-14 NOTE — Progress Notes (Signed)
[place] PHYSICAL MEDICINE & REHABILITATION PROGRESS NOTE   Subjective/Complaints: Patient seen lying in bed this morning.  She states she had some chest discomfort overnight, but that is resolved.  She states she had a good weekend overall.  She has questions about recovery of her numbness.  Review of systems: + Left lower extremity numbness.  Denies CP, shortness of breath, nausea, vomiting, diarrhea.   Objective:   No results found. No results for input(s): WBC, HGB, HCT, PLT in the last 72 hours. Recent Labs    03/13/19 1946  NA 137  K 4.7  CL 104  CO2 26  GLUCOSE 82  BUN 19  CREATININE 1.07*  CALCIUM 8.7*    Intake/Output Summary (Last 24 hours) at 03/14/2019 1111 Last data filed at 03/14/2019 0900 Gross per 24 hour  Intake 360 ml  Output 1455 ml  Net -1095 ml     Physical Exam: Vital Signs Blood pressure 108/60, pulse 76, temperature 98.3 F (36.8 C), temperature source Oral, resp. rate 18, height 4\' 11"  (1.499 m), weight 70.6 kg, SpO2 100 %. Constitutional: No distress . Vital signs reviewed. HENT: Normocephalic.  Atraumatic. Eyes: EOMI. No discharge. Cardiovascular: No JVD. Respiratory: Normal effort. GI: Non-distended. Musc: No edema or tenderness in extremities. Neurologic: Alert Motor: 5/5 in bilateral upper extremities Bilateral lower extremities: 4-4+/5 bilateral lower extremities (right stronger than left) Skin: Warm and dry.  Intact.  Assessment/Plan: 1. Functional deficits secondary to lumbar spinal stenosis with bilateral lower extremity radiculopathy status post fusion 03/04/2019 which require 3+ hours per day of interdisciplinary therapy in a comprehensive inpatient rehab setting.  Physiatrist is providing close team supervision and 24 hour management of active medical problems listed below.  Physiatrist and rehab team continue to assess barriers to discharge/monitor patient progress toward functional and medical goals  Care  Tool:  Bathing    Body parts bathed by patient: Right arm, Left arm, Chest, Abdomen, Front perineal area, Buttocks, Right upper leg, Left upper leg, Right lower leg, Left lower leg, Face   Body parts bathed by helper: Front perineal area, Buttocks     Bathing assist Assist Level: Independent with assistive device     Upper Body Dressing/Undressing Upper body dressing   What is the patient wearing?: Pull over shirt, Bra, Orthosis Orthosis activity level: Performed by patient  Upper body assist Assist Level: Independent with assistive device    Lower Body Dressing/Undressing Lower body dressing      What is the patient wearing?: Underwear/pull up, Pants     Lower body assist Assist for lower body dressing: Independent with assitive device     Toileting Toileting    Toileting assist Assist for toileting: Supervision/Verbal cueing     Transfers Chair/bed transfer  Transfers assist     Chair/bed transfer assist level: Supervision/Verbal cueing     Locomotion Ambulation   Ambulation assist      Assist level: Supervision/Verbal cueing Assistive device: Walker-rolling Max distance: 500 ft   Walk 10 feet activity   Assist     Assist level: Supervision/Verbal cueing Assistive device: Walker-rolling   Walk 50 feet activity   Assist    Assist level: Supervision/Verbal cueing Assistive device: Walker-rolling    Walk 150 feet activity   Assist Walk 150 feet activity did not occur: Safety/medical concerns  Assist level: Supervision/Verbal cueing Assistive device: Walker-rolling    Walk 10 feet on uneven surface  activity   Assist Walk 10 feet on uneven surfaces activity did not occur: Safety/medical  concerns         Wheelchair     Assist Will patient use wheelchair at discharge?: No(TBD but do not anticipate will need)             Wheelchair 50 feet with 2 turns activity    Assist            Wheelchair 150 feet  activity     Assist          Medical Problem List and Plan: 1.Decreased functional mobilitysecondary to lumbar stenosis with radiculopathy. Status post L4-L5-S1 decompression posterior lumbar interbody fusion 03/04/2019. Back brace when out of bed  Cont CIR  Notes reviewed- lumbar stenosis status post decompression, chest pain over the weekend, labs reviewed 2. Antithrombotics: -DVT/anticoagulation:SCDs. Negative vascular study -antiplatelet therapy: Aspirin 81 mg daily 3. Pain Management/fibromyalgia:Cymbalta 30 mg daily, Lyrica 25 mg 3 times daily, hydrocodoneas needed.   Increased Lyrica to 75 mg 3 times daily 4. Mood:Provide emotional support -antipsychotic agents: N/A 5. Neuropsych: This patientiscapable of making decisions on herown behalf. 6. Skin/Wound Care:Routine skin checks 7. Fluids/Electrolytes/Nutrition:Routine in and outs 8.Acute blood loss anemia.  Hemoglobin 9.1 on 6/10  Continue to monitor 9. Transient orthostasis improved. Norvasc 5 mg daily, midamor5 mg daily, Cozaar 100 mg daily, Toprol-XL 50 mg daily. Monitor with increased mobility Vitals:   03/13/19 2004 03/14/19 0659  BP: (!) 102/58 108/60  Pulse: 74 76  Resp: 14 18  Temp: 97.9 F (36.6 C) 98.3 F (36.8 C)  SpO2: 100% 100%   Relatively controlled on 6/15 10. Urinary retention. Routine toileting schedule PVR x1-normal  Improving 11. Constipation. Laxative assistance 12.  Chest pain: Appears to have resolved  ECG reviewed - normal  Troponins negative 13.  AKI  Creatinine 1.07 on 6/14  Encourage fluids   LOS: 6 days A FACE TO FACE EVALUATION WAS PERFORMED  Allyn Bartelson Lorie Phenix 03/14/2019, 11:11 AM

## 2019-03-14 NOTE — Plan of Care (Signed)
  Problem: Consults Goal: RH GENERAL PATIENT EDUCATION Description: See Patient Education module for education specifics. Outcome: Progressing Goal: Skin Care Protocol Initiated - if Braden Score 18 or less Description: If consults are not indicated, leave blank or document N/A Outcome: Progressing   Problem: RH BOWEL ELIMINATION Goal: RH STG MANAGE BOWEL WITH ASSISTANCE Description: STG Manage Bowel with [place].  Outcome: Progressing   Problem: RH BLADDER ELIMINATION Goal: RH STG MANAGE BLADDER WITH ASSISTANCE Description: STG Manage Bladder With Min Assistance  Outcome: Progressing   Problem: RH SKIN INTEGRITY Goal: RH STG SKIN FREE OF INFECTION/BREAKDOWN Description: Pt will be free of skin breakdown during stay with min assist   Outcome: Progressing Goal: RH STG MAINTAIN SKIN INTEGRITY WITH ASSISTANCE Description: STG Maintain Skin Integrity With [place].  Outcome: Progressing Goal: RH STG ABLE TO PERFORM INCISION/WOUND CARE W/ASSISTANCE Description: STG Able To Perform Incision/Wound Care With min Assistance.  Outcome: Progressing   Problem: RH SAFETY Goal: RH STG ADHERE TO SAFETY PRECAUTIONS W/ASSISTANCE/DEVICE Description: STG Adhere to Safety Precautions With Min Assistance/Device.  Outcome: Progressing   Problem: RH PAIN MANAGEMENT Goal: RH STG PAIN MANAGED AT OR BELOW PT'S PAIN GOAL Description: Pain free or pain less than 3   Outcome: Progressing   Problem: RH KNOWLEDGE DEFICIT GENERAL Goal: RH STG INCREASE KNOWLEDGE OF SELF CARE AFTER HOSPITALIZATION Description: Pt will verbalize ways to prevent falls and home safety during admission  Outcome: Progressing

## 2019-03-14 NOTE — Discharge Summary (Addendum)
Physician Discharge Summary  Patient ID: Jodi Bowman MRN: [id number] DOB/AGE: 01/04/1961 58 y.o.  Admit date: 03/08/2019 Discharge date: 03/16/2019  Discharge Diagnoses:  Active Problems:   Lumbar radiculopathy   AKI (acute kidney injury) ([place])   Urinary retention   Orthostasis   Acute blood loss anemia   Fibromyalgia   Chronic pain syndrome   Left otitis media   Discharged Condition: Stable  Significant Diagnostic Studies: Dg Lumbar Spine 2-3 Views  Result Date: 03/04/2019 CLINICAL DATA:  Surgical posterior fusion of L4-5 and L5-S1. EXAM: DG C-ARM 61-120 MIN; LUMBAR SPINE - 2-3 VIEW FLUOROSCOPY TIME:  40 seconds. COMPARISON:  Radiographs of November 04, 2018. FINDINGS: Two intraoperative fluoroscopic images of the lower lumbar spine demonstrate the patient be status post surgical posterior fusion of L4-5 and L5-S1 with bilateral intrapedicular screw placement and interbody fusion. IMPRESSION: Fluoroscopic guidance was provided during surgical posterior fusion of L4-5 and L5-S1. Electronically Signed   By: Marijo Conception M.D.   On: 03/04/2019 13:38   Dg C-arm 1-60 Min  Result Date: 03/04/2019 CLINICAL DATA:  Surgical posterior fusion of L4-5 and L5-S1. EXAM: DG C-ARM 61-120 MIN; LUMBAR SPINE - 2-3 VIEW FLUOROSCOPY TIME:  40 seconds. COMPARISON:  Radiographs of November 04, 2018. FINDINGS: Two intraoperative fluoroscopic images of the lower lumbar spine demonstrate the patient be status post surgical posterior fusion of L4-5 and L5-S1 with bilateral intrapedicular screw placement and interbody fusion. IMPRESSION: Fluoroscopic guidance was provided during surgical posterior fusion of L4-5 and L5-S1. Electronically Signed   By: Marijo Conception M.D.   On: 03/04/2019 13:38   Vas [place] Lower Extremity Venous (dvt)  Result Date: 03/09/2019  Lower Venous Study Indications: Swelling.  Performing Technologist: Maudry Mayhew MHA, RDMS, RVT, RDCS  Examination Guidelines: A complete evaluation  includes B-mode imaging, spectral Doppler, color Doppler, and power Doppler as needed of all accessible portions of each vessel. Bilateral testing is considered an integral part of a complete examination. Limited examinations for reoccurring indications may be performed as noted.  +---------+---------------+---------+-----------+----------+-------+ RIGHT    CompressibilityPhasicitySpontaneityPropertiesSummary +---------+---------------+---------+-----------+----------+-------+ CFV      Full           Yes      Yes                          +---------+---------------+---------+-----------+----------+-------+ SFJ      Full                                                 +---------+---------------+---------+-----------+----------+-------+ FV Prox  Full                                                 +---------+---------------+---------+-----------+----------+-------+ FV Mid   Full                                                 +---------+---------------+---------+-----------+----------+-------+ FV DistalFull                                                 +---------+---------------+---------+-----------+----------+-------+  PFV      Full                                                 +---------+---------------+---------+-----------+----------+-------+ POP      Full           Yes      Yes                          +---------+---------------+---------+-----------+----------+-------+ PTV      Full                                                 +---------+---------------+---------+-----------+----------+-------+ PERO     Full                                                 +---------+---------------+---------+-----------+----------+-------+   +---------+---------------+---------+-----------+----------+-------+ LEFT     CompressibilityPhasicitySpontaneityPropertiesSummary +---------+---------------+---------+-----------+----------+-------+ CFV       Full           Yes      Yes                          +---------+---------------+---------+-----------+----------+-------+ SFJ      Full                                                 +---------+---------------+---------+-----------+----------+-------+ FV Prox  Full                                                 +---------+---------------+---------+-----------+----------+-------+ FV Mid   Full                                                 +---------+---------------+---------+-----------+----------+-------+ FV DistalFull                                                 +---------+---------------+---------+-----------+----------+-------+ PFV      Full                                                 +---------+---------------+---------+-----------+----------+-------+ POP      Full           Yes      Yes                          +---------+---------------+---------+-----------+----------+-------+ PTV  Full                                                 +---------+---------------+---------+-----------+----------+-------+ PERO     Full                                                 +---------+---------------+---------+-----------+----------+-------+     Summary: Right: There is no evidence of deep vein thrombosis in the lower extremity. No cystic structure found in the popliteal fossa. Left: There is no evidence of deep vein thrombosis in the lower extremity. No cystic structure found in the popliteal fossa.  *See table(s) above for measurements and observations. Electronically signed by Curt Jews MD on 03/09/2019 at 4:58:13 PM.    Final     Labs:  Basic Metabolic Panel: Recent Labs  Lab 03/09/19 0707 03/13/19 1946 03/15/19 1111  NA 139 137 130*  K 4.1 4.7 4.7  CL 104 104 98  CO2 29 26 26   GLUCOSE 88 82 87  BUN 8 19 15   CREATININE 0.72 1.07* 0.81  CALCIUM 8.7* 8.7* 8.6*    CBC: Recent Labs  Lab 03/09/19 0707 03/15/19 1111  WBC  8.4 10.9*  NEUTROABS 4.2  --   HGB 9.1* 10.6*  HCT 28.3* 33.8*  MCV 91.6 93.6  PLT 212 485*    CBG: No results for input(s): GLUCAP in the last 168 hours.    Family history.  Mother with diabetes, hypertension, TIA, seizure disorder, dementia.  Brother with colon and lung cancer.  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  Presented 03/04/2019 with chronic intractable back pain radiating to the lower extremities failing all conservative management.  Work-up demonstrated evidence of marked degeneration with disc space collapse and significant foraminal stenosis with radiculopathy at L4-5 and L5-S1.  Underwent bilateral L4-5, L5-S1 decompressive laminotomies and foraminotomies with posterior lumbar interbody fusion 03/04/2019 per Dr. Annette Stable.  Hospital course pain management.  Back brace when out of bed applied in sitting position.  Acute blood loss anemia 9.1.  Patient with intermittent bouts of orthostatic hypotension transient likely multifactorial and monitored with improvement.  She did initially receive IV fluids.  Postoperative urinary retention improved with routine toileting schedule.  Therapy evaluations completed and patient was admitted for a comprehensive rehab program  Hospital Course: ANGELITA HARNACK was admitted to rehab 03/08/2019 for inpatient therapies to consist of PT, ST and OT at least three hours five days a week. Past admission physiatrist, therapy team and rehab RN have worked together to provide customized collaborative inpatient rehab.  Pertaining to patient's lumbar stenosis with radiculopathy had undergone L4-L5-S1 decompressive lumbar interbody fusion 03/04/2019.  Back brace when out of bed.  Patient would follow-up neurosurgery.  Back incision clean and dry.  SCDs for DVT prophylaxis.  Negative vascular  studies.  She continued on low-dose aspirin for history of TIA.  Pain management use of Cymbalta as well as Lyrica with hydrocodone as needed her Lyrica had been increased to 75 mg 3 times daily.  Acute blood loss anemia stable 9.1.  Blood pressures controlled monitored on Norvasc, Cozaar,  Toprol as well as midamor.  Patient would follow-up with primary MD.  Urinary retention improved with routine toileting.  Last PVR within normal limits.  Bouts of constipation resolved with laxative assistance.  Physical exam.  Blood pressure 124/56 pulse 84 temperature 98.4 respirations 15 oxygen saturations 99% room air. Constitutional.  No distress HEENT Head.  Normocephalic and atraumatic Eyes.  Pupils round and reactive to light EOMs normal no discharge Neck.  Supple nontender normal range of motion no thyromegaly Cardiovascular normal rate and rhythm no murmur heard Respiratory.  Effort normal no respiratory distress without wheeze GI.  Soft exhibits no distention nontender without rebound Neurological.  Oriented follows commands upper extremities 4 out of 5 lower extremities 3 out of 5 hip flexors, knee extension 4 out of 5 ankle dorsi plantarflexion.  Back incision clean and dry  Rehab course: During patient's stay in rehab weekly team conferences were held to monitor patient's progress, set goals and discuss barriers to discharge. At admission, patient required minimal assist ambulate 12 feet rolling walker, moderate assist stand pivot transfers, minimal guard for rolling.  Minimal assist for grooming moderate assist upper body bathing max assist lower body bathing mod assist upper body bathing max assist lower body dressing moderate assist toilet transfers  She  has had improvement in activity tolerance, balance, postural control as well as ability to compensate for deficits. She has had improvement in functional use RUE/LUE  and RLE/LLE as well as improvement in awareness.  Patient ambulating 500 feet  rolling walker supervision 500 feet rolling walker supervision.  Gait with obstacle negotiation around over obstacles supervision.  Up and down stairs with supervision.  She can gather belongings for activities the living and homemaking.  She was advised no driving.  Full family teaching was completed plan discharge to home.       Disposition: Discharge disposition: 01-Home or Self Care     Discharge to home.   Diet: Regular  Special Instructions: No driving smoking or alcohol  Back brace when out of bed.  Medications at discharge. 1.  Tylenol as needed 2Midamor 5 mg daily 3.  Norvasc 5 mg daily 4.  Aspirin 81 mg daily 5.  Cymbalta 30 mg daily 6.  Folic acid 1 mg daily 7.  Hydrocodone 1 tablet every 4 hours as needed pain 8.  Claritin 10 mg daily 9.  Cozaar 100 mg daily 10.  Robaxin 500 mg every 6 hours as needed 11.  Toprol-XL 50 mg daily 12.  Nitroglycerin as needed 13.  Protonix 40 mg twice daily 14.  Lyrica 75 mg 3 times daily 15.  Vitamin D 50,000 units Monday Wednesday Friday  Discharge Instructions    Ambulatory referral to Physical Medicine Rehab   Complete by: As directed    Moderate complexity follow-up 1 to 2 weeks lumbar radiculopathy      Follow-up Information    Jamse Arn, MD Follow up.   Specialty: Physical Medicine and Rehabilitation Why: Office to call for appointment Contact information: [place] [address] [phone]        Earnie Larsson, MD Follow up.   Specialty: Neurosurgery Why: Call for appointment Contact information: [address] [place] [address] [phone]           Signed: Cathlyn Parsons 03/16/2019, 6:20 AM Patient seen and examined by me on day of discharge. Delice Lesch, MD, ABPMR

## 2019-03-15 ENCOUNTER — Inpatient Hospital Stay (HOSPITAL_COMMUNITY): Payer: Medicare Other | Admitting: Occupational Therapy

## 2019-03-15 ENCOUNTER — Inpatient Hospital Stay (HOSPITAL_COMMUNITY): Payer: Medicare Other | Admitting: Physical Therapy

## 2019-03-15 DIAGNOSIS — H6692 Otitis media, unspecified, left ear: Secondary | ICD-10-CM

## 2019-03-15 LAB — CBC
HCT: 33.8 % — ABNORMAL LOW (ref 36.0–46.0)
Hemoglobin: 10.6 g/dL — ABNORMAL LOW (ref 12.0–15.0)
MCH: 29.4 pg (ref 26.0–34.0)
MCHC: 31.4 g/dL (ref 30.0–36.0)
MCV: 93.6 fL (ref 80.0–100.0)
Platelets: 485 10*3/uL — ABNORMAL HIGH (ref 150–400)
RBC: 3.61 MIL/uL — ABNORMAL LOW (ref 3.87–5.11)
RDW: 12.7 % (ref 11.5–15.5)
WBC: 10.9 10*3/uL — ABNORMAL HIGH (ref 4.0–10.5)
nRBC: 0 % (ref 0.0–0.2)

## 2019-03-15 LAB — BASIC METABOLIC PANEL
Anion gap: 6 (ref 5–15)
BUN: 15 mg/dL (ref 6–20)
CO2: 26 mmol/L (ref 22–32)
Calcium: 8.6 mg/dL — ABNORMAL LOW (ref 8.9–10.3)
Chloride: 98 mmol/L (ref 98–111)
Creatinine, Ser: 0.81 mg/dL (ref 0.44–1.00)
GFR calc Af Amer: >60 mL/min (ref >60)
GFR calc non Af Amer: >60 mL/min (ref >60)
Glucose, Bld: 87 mg/dL (ref 70–99)
Potassium: 4.7 mmol/L (ref 3.5–5.1)
Sodium: 130 mmol/L — ABNORMAL LOW (ref 135–145)

## 2019-03-15 MED ORDER — LOSARTAN POTASSIUM 100 MG PO TABS: 100.0000 mg | ORAL_TABLET | Freq: Every day | ORAL | 0 refills | Status: DC

## 2019-03-15 MED ORDER — HYDROCODONE-ACETAMINOPHEN 10-325 MG PO TABS: 1.0000 | ORAL_TABLET | ORAL | 0 refills | Status: DC | PRN

## 2019-03-15 MED ORDER — DULOXETINE HCL 30 MG PO CPEP: 30.0000 mg | ORAL_CAPSULE | Freq: Every day | ORAL | 3 refills | Status: DC

## 2019-03-15 MED ORDER — AMILORIDE HCL 5 MG PO TABS: 5.0000 mg | ORAL_TABLET | Freq: Every day | ORAL | 0 refills | Status: DC

## 2019-03-15 MED ORDER — CIPROFLOXACIN-DEXAMETHASONE 0.3-0.1 % OT SUSP
4.0000 [drp] | Freq: Two times a day (BID) | OTIC | Status: DC
  Administered 2019-03-15 – 2019-03-16 (×2): 4 [drp] via OTIC
  Filled 2019-03-15 (×2): qty 7.5

## 2019-03-15 MED ORDER — PREGABALIN 75 MG PO CAPS: 75.0000 mg | ORAL_CAPSULE | Freq: Three times a day (TID) | ORAL | 0 refills | Status: AC

## 2019-03-15 MED ORDER — PANTOPRAZOLE SODIUM 40 MG PO TBEC: 40.0000 mg | DELAYED_RELEASE_TABLET | Freq: Two times a day (BID) | ORAL | 0 refills | Status: AC

## 2019-03-15 MED ORDER — AMLODIPINE BESYLATE 5 MG PO TABS: 5.0000 mg | ORAL_TABLET | Freq: Every day | ORAL | 1 refills | Status: DC

## 2019-03-15 MED ORDER — ACETAMINOPHEN 325 MG PO TABS: 650.0000 mg | ORAL_TABLET | ORAL | Status: DC | PRN

## 2019-03-15 MED ORDER — METOPROLOL SUCCINATE ER 50 MG PO TB24: 50.0000 mg | ORAL_TABLET | Freq: Every day | ORAL | 0 refills | Status: DC

## 2019-03-15 MED ORDER — VITAMIN D (ERGOCALCIFEROL) 1.25 MG (50000 UNIT) PO CAPS: 50000.0000 [IU] | ORAL_CAPSULE | ORAL | 0 refills | Status: DC

## 2019-03-15 MED ORDER — METHOCARBAMOL 500 MG PO TABS: 500.0000 mg | ORAL_TABLET | Freq: Four times a day (QID) | ORAL | 0 refills | Status: DC | PRN

## 2019-03-15 NOTE — Progress Notes (Signed)
Occupational Therapy Session Note  Patient Details  Name: ELTA ANGELL MRN: [id number] Date of Birth: 1961/06/11  Today's Date: 03/15/2019 OT Individual Time: 2409-7353 OT Individual Time Calculation (min): 55 min    Short Term Goals: Week 1:  OT Short Term Goal 1 (Week 1): na due to short LOS  Skilled Therapeutic Interventions/Progress Updates:    Completed ADL retraining at overall Mod I level.  Pt donned LSO seated EOB without assistance.  Utilized RW to ambulate around room to gather clothing prior to bathing at sink.  Pt overall Mod I with RW with functional mobility in room and completed bathing and dressing at sit > stand level.  Pt utilizes figure 4 position when donning TEDS and socks.  Discussed energy conservation and recommended DME to increase safety and allow for energy conservation during self-care tasks.  Pt left upright in recliner with all needs in reach and breakfast tray set up.  Therapy Documentation Precautions:  Precautions Precautions: Back, Fall Precaution Comments: LSO when out of bed. Ok to donn sitting and to shower with it off. Required Braces or Orthoses: Spinal Brace Spinal Brace: Applied in sitting position Restrictions Weight Bearing Restrictions: No Vital Signs: Therapy Vitals Temp: 98.2 F (36.8 C) Temp Source: Oral Pulse Rate: 67 Resp: 18 BP: 90/76 Patient Position (if appropriate): Sitting Oxygen Therapy SpO2: 100 % O2 Device: Room Air Pain:  Pt reports pain 4/10 in lower back.  Premedicated.  Therapy/Group: Individual Therapy  Simonne Come 03/15/2019, 8:24 AM

## 2019-03-15 NOTE — Plan of Care (Signed)
  Problem: RH BOWEL ELIMINATION Goal: RH STG MANAGE BOWEL WITH ASSISTANCE Description: STG Manage Bowel with Min Assistance.  Outcome: Progressing Goal: RH STG MANAGE BOWEL W/MEDICATION W/ASSISTANCE Description: STG Manage Bowel with Medication with min Assistance.  Outcome: Progressing   Problem: RH SKIN INTEGRITY Goal: RH STG SKIN FREE OF INFECTION/BREAKDOWN Description: Pt will be free of skin breakdown during stay with min assist   Outcome: Progressing   Problem: RH PAIN MANAGEMENT Goal: RH STG PAIN MANAGED AT OR BELOW PT'S PAIN GOAL Description: Pain free or pain less than 3   Outcome: Progressing

## 2019-03-15 NOTE — Progress Notes (Signed)
Physical Therapy Discharge Summary  Patient Details  Name: FYNLEE ROWLANDS MRN: [id number] Date of Birth: November 04, 1960  Today's Date: 03/15/2019 PT Individual Time: 0940-1040 PT Individual Time Calculation (min): 60 min   Pt performs gait in home and controlled environments including ramp negotiation with mod I with RW. Pt able to perform gait 1000', 1400' all with mod I.  Pt performs laundry task with reacher with supervision and is educated on body mechanics, energy conservation and safety with laundry task. Pt performs furniture transfers and simulated car transfer all with mod I. Pt given printout of HEP and is able to verbalize understanding of all exercises for LE strength and balance. Pt reports she feels ready for d/c home tomorrow.  Patient has met 6 of 6 long term goals due to improved activity tolerance, improved balance, increased strength, decreased pain and ability to compensate for deficits.  Patient to discharge at an ambulatory level Modified Independent.     Reasons goals not met: n/a, all goals met  Recommendation:  Patient will benefit from ongoing skilled PT services in home health setting to continue to advance safe functional mobility, address ongoing impairments in strength, balance, and minimize fall risk.  Equipment: No equipment provided  Reasons for discharge: treatment goals met and discharge from hospital  Patient/family agrees with progress made and goals achieved: Yes  PT Discharge Precautions/Restrictions Precautions Precautions: Back;Fall Required Braces or Orthoses: Spinal Brace Spinal Brace: Applied in sitting position Vital Signs Therapy Vitals BP: (!) 124/59 Pain Pain Assessment Pain Score: 5  Pain Type: Surgical pain Pain Location: Back Pain Orientation: Lower Pain Descriptors / Indicators: Aching Pain Onset: On-going Pain Intervention(s): Repositioned Vision/Perception  Perception Perception: Within Functional Limits Praxis Praxis:  Intact  Cognition Overall Cognitive Status: Within Functional Limits for tasks assessed Arousal/Alertness: Awake/alert Orientation Level: Oriented X4 Sensation Sensation Light Touch Impaired Details: Impaired LLE;Impaired RLE Proprioception: Appears Intact Coordination Gross Motor Movements are Fluid and Coordinated: Yes Fine Motor Movements are Fluid and Coordinated: Yes Motor  Motor Motor - Discharge Observations: improving strength  Mobility Bed Mobility Rolling Right: Independent with assistive device Right Sidelying to Sit: Independent with assistive device Transfers Sit to Stand: Independent with assistive device Stand to Sit: Independent with assistive device Stand Pivot Transfers: Independent with assistive device Locomotion  Gait Gait Assistance: Independent with assistive device Gait Distance (Feet): 1000 Feet Assistive device: Rolling walker Stairs / Additional Locomotion Stairs Assistance: Supervision/Verbal cueing Stair Management Technique: Two rails Number of Stairs: 12 Ramp: Independent with assistive device Wheelchair Mobility Wheelchair Mobility: No  Trunk/Postural Assessment  Cervical Assessment Cervical Assessment: Within Functional Limits Thoracic Assessment Thoracic Assessment: Within Functional Limits Lumbar Assessment Lumbar Assessment: (LSO) Postural Control Postural Control: Within Functional Limits  Balance Dynamic Sitting Balance Sitting balance - Comments: independent Dynamic Standing Balance Dynamic Standing - Level of Assistance: 6: Modified independent (Device/Increase time) Extremity Assessment      RLE Assessment General Strength Comments: grossly 3+/5 LLE Assessment General Strength Comments: grossly 3+/5    Marcos Eke, PT, DPT 03/15/2019, 12:33 PM

## 2019-03-15 NOTE — Progress Notes (Signed)
Occupational Therapy Session Note  Patient Details  Name: Jodi Bowman MRN: [id number] Date of Birth: Mar 13, 1961  Today's Date: 03/15/2019 OT Individual Time: 1330-1400 OT Individual Time Calculation (min): 30 min    Short Term Goals: Week 1:  OT Short Term Goal 1 (Week 1): na due to short LOS  Skilled Therapeutic Interventions/Progress Updates:    Treatment session with focus on functional mobility in home environment.  Pt ambulated 300' with RW at mod I level, therapist pushed w/c remainder of the way to ADL apt for energy conservation.  Engaged in simulated meal prep with pt at overall mod I level.  Pt reports plan to have husband or daughter do the majority of the cooking with her overseeing, but may make simple snacks or breakfast occasionally.  Discussed modifications to kitchen setup to increase independence while adhering to back precautions with pt reporting understanding.  Discussed use of counter tops and walker bag to transport items to maintain BUE on RW.  Pt with one instance of light headedness which subsided after a 1-2 min seated rest break. Pt reports plan to have chair near by when in kitchen.  Pt reports ready for d/c, with no further questions.    Therapy Documentation Precautions:  Precautions Precautions: Back, Fall Precaution Comments: LSO when out of bed. Ok to donn sitting and to shower with it off. Required Braces or Orthoses: Spinal Brace Spinal Brace: Applied in sitting position Restrictions Weight Bearing Restrictions: No General:   Vital Signs: Therapy Vitals Temp: 97.9 F (36.6 C) Temp Source: Oral Pulse Rate: 67 Resp: 16 BP: (!) 94/49 Patient Position (if appropriate): Lying Oxygen Therapy SpO2: 100 % O2 Device: Room Air Pain: Pain Assessment Pain Score: 5  Pain Type: Surgical pain Pain Location: Back Pain Orientation: Lower Pain Descriptors / Indicators: Aching Pain Onset: On-going Pain Intervention(s):  Repositioned   Therapy/Group: Individual Therapy  Simonne Come 03/15/2019, 3:01 PM

## 2019-03-15 NOTE — Progress Notes (Signed)
Physical Therapy Session Note  Patient Details  Name: Jodi Bowman MRN: [id number] Date of Birth: 08-Jun-1961  Today's Date: 03/15/2019 PT Individual Time: 1157-1242 PT Individual Time Calculation (min): 45 min   Short Term Goals: Week 1:  PT Short Term Goal 1 (Week 1): = LTG  Skilled Therapeutic Interventions/Progress Updates:    Pt received supine in bed with home health agency leaving upon therapist arrival and pt agreeable to therapy session. Supine>sit mod-I using bed features. Donned lumbar brace sitting on EOB independently. Ambulated in room using RW mod-I. Performed printed HEP with cuing throughout for proper technique/from 2x10 repetitions each LE - education on proper set-up for safety when performing exercises at home:  - standing knee flexion hamstring curls - cuing for proper speed of movement  - standing heel raises - seated LAQs with pt reporting onset of L LE "tightness" type pain and educated on performing movement through pain free ROM  - standing hip abduction with pt demonstrating L hip weakness with max cuing for proper technique of exercise  - standing hip extension with pt continuing to demonstrate L hip weakness via trendelenburg posture resulting in increased difficulty performing R LE hip extension movement  Sit<>stand recliner/chair<>RW mod-I throughout session. Doffed lumbar brace sitting on EOB. Sit>supine mod-I but min cuing on proper reverse logroll technique. Educated pt on sidelying clamshell exercise as alternative for standing hip abduction due to increased difficulty performing that exercise. Performed R sidelying L LE clamshell 2x10 repetitions - provided printed HEP page with exercise. Pt left supine in bed with HOB elevated and meal tray set-up - pt made mod-I in room earlier this AM.      Therapy Documentation Precautions:  Precautions Precautions: Back, Fall Precaution Comments: LSO when out of bed. Ok to donn sitting and to shower with it  off. Required Braces or Orthoses: Spinal Brace Spinal Brace: Applied in sitting position Restrictions Weight Bearing Restrictions: No  Pain: Reports pain level of 5/10 at beginning of session that increased to 7/10 with activity - pt reports this happens no matter how much activity and the only thing that dissipates the pain is rest and medication - RN notified and present for medication administration.   Therapy/Group: Individual Therapy  Tawana Scale, PT, DPT 03/15/2019, 8:00 AM

## 2019-03-15 NOTE — Progress Notes (Signed)
Occupational Therapy Discharge Summary  Patient Details  Name: Jodi Bowman MRN: [id number] Date of Birth: 1961-09-07  Patient has met 11 of 11 long term goals due to improved activity tolerance, improved balance, postural control, ability to compensate for deficits and improved awareness.  Patient to discharge at overall Modified Independent level, supervision for shower transfers.  Patient's care partner is independent to provide the necessary intermittent assistance at discharge.  Patient to d/c home with husband and 31 yo daughter.  Reasons goals not met: N/A  Recommendation:  Patient will benefit from ongoing skilled OT services in home health setting to continue to advance functional skills in the area of BADL and Reduce care partner burden.  Equipment: 3 in 1  Reasons for discharge: treatment goals met and discharge from hospital  Patient/family agrees with progress made and goals achieved: Yes  OT Discharge Precautions/Restrictions  Precautions Precautions: Back;Fall Required Braces or Orthoses: Spinal Brace Spinal Brace: Applied in sitting position Vital Signs Therapy Vitals Temp: 97.9 F (36.6 C) Temp Source: Oral Pulse Rate: 67 Resp: 16 BP: (!) 94/49 Patient Position (if appropriate): Lying Oxygen Therapy SpO2: 100 % O2 Device: Room Air Pain Pain Assessment Pain Score: 5  Pain Type: Surgical pain Pain Location: Back Pain Orientation: Lower Pain Descriptors / Indicators: Aching Pain Onset: On-going Pain Intervention(s): Repositioned ADL ADL Eating: Independent Where Assessed-Eating: Chair Grooming: Independent Where Assessed-Grooming: Standing at sink Upper Body Bathing: Modified independent Where Assessed-Upper Body Bathing: Shower Lower Body Bathing: Modified independent Where Assessed-Lower Body Bathing: Shower Upper Body Dressing: Modified independent (Device) Where Assessed-Upper Body Dressing: Edge of bed Lower Body Dressing: Modified  independent Where Assessed-Lower Body Dressing: Edge of bed Toileting: Modified independent Where Assessed-Toileting: Glass blower/designer: Diplomatic Services operational officer Method: Counselling psychologist: Energy manager: Distant supervision Social research officer, government Method: Heritage manager: Civil engineer, contracting with back Vision Baseline Vision/History: Wears glasses Wears Glasses: Reading only Patient Visual Report: No change from baseline Vision Assessment?: No apparent visual deficits Perception  Perception: Within Functional Limits Praxis Praxis: Intact Cognition Overall Cognitive Status: Within Functional Limits for tasks assessed Arousal/Alertness: Awake/alert Orientation Level: Oriented X4 Memory: Appears intact Awareness: Appears intact Problem Solving: Appears intact Safety/Judgment: Appears intact Sensation Sensation Light Touch: Impaired Detail Light Touch Impaired Details: Impaired LLE;Impaired RLE Hot/Cold: Appears Intact Proprioception: Appears Intact Coordination Gross Motor Movements are Fluid and Coordinated: Yes Fine Motor Movements are Fluid and Coordinated: Yes Motor  Motor Motor - Discharge Observations: improving strength Mobility  Bed Mobility Rolling Right: Independent with assistive device Right Sidelying to Sit: Independent with assistive device Transfers Sit to Stand: Independent with assistive device Stand to Sit: Independent with assistive device  Trunk/Postural Assessment  Cervical Assessment Cervical Assessment: Within Functional Limits Thoracic Assessment Thoracic Assessment: Within Functional Limits Lumbar Assessment Lumbar Assessment: (LSO) Postural Control Postural Control: Within Functional Limits  Balance Dynamic Sitting Balance Sitting balance - Comments: independent Dynamic Standing Balance Dynamic Standing - Level of Assistance: 6: Modified independent (Device/Increase  time) Extremity/Trunk Assessment RUE Assessment General Strength Comments: 4+/5 LUE Assessment LUE Assessment: Within Functional Limits General Strength Comments: 4+/5   Grethel Zenk 60/16/2020, 3:12 PM

## 2019-03-15 NOTE — Progress Notes (Signed)
[facility] PHYSICAL MEDICINE & REHABILITATION PROGRESS NOTE   Subjective/Complaints: Patient seen sitting up in her chair, working with therapies this morning.  She states she slept well overnight.  She complains of left ear infection.  Review of systems: + Left ear infection.  Denies CP, shortness of breath, nausea, vomiting, diarrhea.   Objective:   No results found. No results for input(s): WBC, HGB, HCT, PLT in the last 72 hours. Recent Labs    03/13/19 1946  NA 137  K 4.7  CL 104  CO2 26  GLUCOSE 82  BUN 19  CREATININE 1.07*  CALCIUM 8.7*    Intake/Output Summary (Last 24 hours) at 03/15/2019 0959 Last data filed at 03/15/2019 0340 Gross per 24 hour  Intake 100 ml  Output 900 ml  Net -800 ml     Physical Exam: Vital Signs Blood pressure (!) 124/59, pulse 67, temperature 98.2 F (36.8 C), temperature source Oral, resp. rate 18, height 4\' 11"  (1.499 m), weight 70.6 kg, SpO2 100 %. Constitutional: No distress . Vital signs reviewed. HENT: Normocephalic.  Atraumatic. Eyes: EOMI.  No discharge. Cardiovascular: No JVD. Respiratory: Normal effort. GI: Non-distended. Musc: No edema or tenderness in extremities. Neurologic: Alert Motor: 5/5 in bilateral upper extremities Bilateral lower extremities: 4-4+/5 bilateral lower extremities (right stronger than left) Skin: Warm and dry.  Intact.  Assessment/Plan: 1. Functional deficits secondary to lumbar spinal stenosis with bilateral lower extremity radiculopathy status post fusion 03/04/2019 which require 3+ hours per day of interdisciplinary therapy in a comprehensive inpatient rehab setting.  Physiatrist is providing close team supervision and 24 hour management of active medical problems listed below.  Physiatrist and rehab team continue to assess barriers to discharge/monitor patient progress toward functional and medical goals  Care Tool:  Bathing    Body parts bathed by patient: Right arm, Left arm, Chest,  Abdomen, Front perineal area, Buttocks, Right upper leg, Left upper leg, Right lower leg, Left lower leg, Face   Body parts bathed by helper: Front perineal area, Buttocks     Bathing assist Assist Level: Independent with assistive device     Upper Body Dressing/Undressing Upper body dressing   What is the patient wearing?: Pull over shirt, Bra, Orthosis Orthosis activity level: Performed by patient  Upper body assist Assist Level: Independent with assistive device    Lower Body Dressing/Undressing Lower body dressing      What is the patient wearing?: Underwear/pull up, Pants     Lower body assist Assist for lower body dressing: Independent with assitive device     Toileting Toileting    Toileting assist Assist for toileting: Independent with assistive device     Transfers Chair/bed transfer  Transfers assist     Chair/bed transfer assist level: Independent with assistive device     Locomotion Ambulation   Ambulation assist      Assist level: Supervision/Verbal cueing Assistive device: Walker-rolling Max distance: 500 ft   Walk 10 feet activity   Assist     Assist level: Supervision/Verbal cueing Assistive device: Walker-rolling   Walk 50 feet activity   Assist    Assist level: Supervision/Verbal cueing Assistive device: Walker-rolling    Walk 150 feet activity   Assist Walk 150 feet activity did not occur: Safety/medical concerns  Assist level: Supervision/Verbal cueing Assistive device: Walker-rolling    Walk 10 feet on uneven surface  activity   Assist Walk 10 feet on uneven surfaces activity did not occur: Safety/medical concerns  Wheelchair     Assist Will patient use wheelchair at discharge?: No(TBD but do not anticipate will need)             Wheelchair 50 feet with 2 turns activity    Assist            Wheelchair 150 feet activity     Assist          Medical Problem List and  Plan: 1.Decreased functional mobilitysecondary to lumbar stenosis with radiculopathy. Status post L4-L5-S1 decompression posterior lumbar interbody fusion 03/04/2019. Back brace when out of bed  Cont CIR  Plan for d/c tomorrow  Will see patient for transitional care management in 1-2 weeks post-discharge 2. Antithrombotics: -DVT/anticoagulation:SCDs. Negative vascular study -antiplatelet therapy: Aspirin 81 mg daily 3. Pain Management/fibromyalgia:Cymbalta 30 mg daily, Lyrica 25 mg 3 times daily, hydrocodoneas needed.   Increased Lyrica to 75 mg 3 times daily  Relatively controlled on 6/16 4. Mood:Provide emotional support -antipsychotic agents: N/A 5. Neuropsych: This patientiscapable of making decisions on herown behalf. 6. Skin/Wound Care:Routine skin checks 7. Fluids/Electrolytes/Nutrition:Routine in and outs 8.Acute blood loss anemia.  Hemoglobin 9.1 on 6/10, labs pending  Continue to monitor 9. Transient orthostasis improved. Norvasc 5 mg daily, midamor5 mg daily, Cozaar 100 mg daily, Toprol-XL 50 mg daily. Monitor with increased mobility Vitals:   03/15/19 0532 03/15/19 0854  BP: 90/76 (!) 124/59  Pulse: 67   Resp: 18   Temp: 98.2 F (36.8 C)   SpO2: 100%    Relatively controlled on 6/16 10. Urinary retention. Routine toileting schedule PVR x1-normal  Improving 11. Constipation. Laxative assistance 12.  Chest pain: Resolved  ECG reviewed - normal  Troponins negative 13.  AKI  Creatinine 1.07 on 6/14, labs pending  Encourage fluids 14.  Left ear infection  Recurrent per patient  Antibiotic drops ordered   LOS: 7 days A FACE TO FACE EVALUATION WAS PERFORMED  Ankit Lorie Phenix 03/15/2019, 9:59 AM

## 2019-03-16 DIAGNOSIS — H60392 Other infective otitis externa, left ear: Secondary | ICD-10-CM

## 2019-03-16 MED ORDER — CIPROFLOXACIN-DEXAMETHASONE 0.3-0.1 % OT SUSP
4.0000 [drp] | Freq: Two times a day (BID) | OTIC | Status: DC
  Filled 2019-03-16: qty 7.5

## 2019-03-16 MED ORDER — CIPROFLOXACIN-DEXAMETHASONE 0.3-0.1 % OT SUSP: 4.0000 [drp] | Freq: Two times a day (BID) | OTIC | 0 refills | Status: AC

## 2019-03-16 NOTE — Discharge Instructions (Signed)
Inpatient Rehab Discharge Instructions  [facility] Discharge date and time: No discharge date for patient encounter.   Activities/Precautions/ Functional Status: Activity: Back brace when out of bed Diet: regular diet Wound Care: keep wound clean and dry Functional status:  ___ No restrictions     ___ Walk up steps independently ___ 24/7 supervision/assistance   ___ Walk up steps with assistance ___ Intermittent supervision/assistance  ___ Bathe/dress independently ___ Walk with walker     _x__ Bathe/dress with assistance ___ Walk Independently    ___ Shower independently ___ Walk with assistance    ___ Shower with assistance ___ No alcohol     ___ Return to work/school ________  COMMUNITY REFERRALS UPON DISCHARGE:   Home Health:   PT     RN  Agency:  [facility] Phone:  [phone] Medical Equipment/Items Ordered:  Bedside commode  Agency/Supplier:  [facility]        Phone:  [phone]  Special Instructions: No smoking driving or alcohol    COMMUNITY REFERRALS UPON DISCHARGE:    Home Health:   PT, OT, RN  [facility]   Date of last service:03/16/2019  Medical Equipment/Items Ordered:YOUTH ROLLING WALKER FROM ACUTE  Agency/Supplier:[facility]   [phone]   My questions have been answered and I understand these instructions. I will adhere to these goals and the provided educational materials after my discharge from the hospital.  Patient/Caregiver Signature _______________________________ Date __________  Clinician Signature _______________________________________ Date __________  Please bring this form and your medication list with you to all your follow-up doctor's appointments.

## 2019-03-16 NOTE — Progress Notes (Signed)
Jodi Bowman to be D/C'd per MD order. Discussed with the patient and all questions fully answered. ? VSS, Skin clean, dry and intact without evidence of skin break down, no evidence of skin tears noted. ? IV catheter discontinued intact. Site without signs and symptoms of complications. Dressing and pressure applied. ? An After Visit Summary was printed and given to the patient. Patient informed where to pickup prescriptions. ? D/c education completed with patient/family including follow up instructions, medication list, d/c activities limitations if indicated, with other d/c instructions as indicated by MD - patient able to verbalize understanding, all questions fully answered.  ? Patient instructed to return to ED, call 911, or call MD for any changes in condition.  ? Patient to be escorted via [place], and D/C home via private auto.

## 2019-03-18 NOTE — Progress Notes (Signed)
Social Work Discharge Note  The overall goal for the admission was met for:   Discharge location: Yes - home with her husband  Length of Stay: Yes - 8 days  Discharge activity level: Yes - mod I/S  Home/community participation: Yes  Services provided included: MD, RD, PT, OT, RN, Pharmacy and SW  Financial Services: Medicare and Medicaid  Follow-up services arranged: Home Health: PT/RN from [facility], DME: bedside commode from [employer] (she has other DME from acute stay) and Patient/Family has no preference for HH/DME agencies  Comments (or additional information): Sadae Arrazola - husband [phone]  Patient/Family verbalized understanding of follow-up arrangements: Yes  Individual responsible for coordination of the follow-up plan: pt and her husband for supervision, as needed    Confirmed correct DME delivered: Trey Sailors 03/18/2019    Ellyson Rarick, Silvestre Mesi

## 2019-03-22 ENCOUNTER — Telehealth: Payer: Self-pay | Admitting: *Deleted

## 2019-03-22 NOTE — Telephone Encounter (Signed)
She should call the surgeon and notify him and be scheduled to be seen sooner.  Thanks.

## 2019-03-22 NOTE — Telephone Encounter (Signed)
[facility] called to report that Jodi Bowman is having numbness in her legs both sides. She is not scheduled to see her surgeon until July. PLease advise.

## 2019-03-23 NOTE — Telephone Encounter (Signed)
I spoke to Jodi Bowman.  Apparently the symptoms are not new and she talked to Dr Trenton Gammon about it in the hospital.

## 2019-03-23 NOTE — Telephone Encounter (Signed)
Then she should continue with her current therapy treatment.

## 2019-03-24 ENCOUNTER — Telehealth: Payer: Self-pay

## 2019-03-24 NOTE — Telephone Encounter (Signed)
Deanna, RN from [facility] called requesting verbal orders HHPT 1wk9. Orders approved and given per Dr. Posey Pronto.

## 2019-03-28 ENCOUNTER — Encounter: Payer: Medicare Other | Attending: Physical Medicine & Rehabilitation | Admitting: Physical Medicine & Rehabilitation

## 2019-04-02 ENCOUNTER — Inpatient Hospital Stay (HOSPITAL_COMMUNITY)
Admission: EM | Admit: 2019-04-02 | Discharge: 2019-04-05 | DRG: 902 | Disposition: A | Discharge status: Home Health | Payer: Medicare Other | Attending: Internal Medicine | Admitting: Internal Medicine

## 2019-04-02 ENCOUNTER — Encounter (HOSPITAL_COMMUNITY): Payer: Self-pay | Admitting: Emergency Medicine

## 2019-04-02 ENCOUNTER — Emergency Department (HOSPITAL_COMMUNITY): Payer: Medicare Other

## 2019-04-02 ENCOUNTER — Other Ambulatory Visit: Payer: Self-pay

## 2019-04-02 DIAGNOSIS — Z981 Arthrodesis status: Secondary | ICD-10-CM | POA: Diagnosis not present

## 2019-04-02 DIAGNOSIS — Z9842 Cataract extraction status, left eye: Secondary | ICD-10-CM | POA: Diagnosis not present

## 2019-04-02 DIAGNOSIS — Z6830 Body mass index (BMI) 30.0-30.9, adult: Secondary | ICD-10-CM

## 2019-04-02 DIAGNOSIS — Z91013 Allergy to seafood: Secondary | ICD-10-CM | POA: Diagnosis not present

## 2019-04-02 DIAGNOSIS — M5441 Lumbago with sciatica, right side: Secondary | ICD-10-CM | POA: Diagnosis not present

## 2019-04-02 DIAGNOSIS — T148XXA Other injury of unspecified body region, initial encounter: Secondary | ICD-10-CM | POA: Diagnosis not present

## 2019-04-02 DIAGNOSIS — T8131XA Disruption of external operation (surgical) wound, not elsewhere classified, initial encounter: Secondary | ICD-10-CM | POA: Diagnosis present

## 2019-04-02 DIAGNOSIS — M797 Fibromyalgia: Secondary | ICD-10-CM | POA: Diagnosis present

## 2019-04-02 DIAGNOSIS — Z87892 Personal history of anaphylaxis: Secondary | ICD-10-CM | POA: Diagnosis not present

## 2019-04-02 DIAGNOSIS — Z8673 Personal history of transient ischemic attack (TIA), and cerebral infarction without residual deficits: Secondary | ICD-10-CM | POA: Diagnosis not present

## 2019-04-02 DIAGNOSIS — I10 Essential (primary) hypertension: Secondary | ICD-10-CM | POA: Diagnosis present

## 2019-04-02 DIAGNOSIS — Z79899 Other long term (current) drug therapy: Secondary | ICD-10-CM

## 2019-04-02 DIAGNOSIS — Z888 Allergy status to other drugs, medicaments and biological substances status: Secondary | ICD-10-CM | POA: Diagnosis not present

## 2019-04-02 DIAGNOSIS — Y838 Other surgical procedures as the cause of abnormal reaction of the patient, or of later complication, without mention of misadventure at the time of the procedure: Secondary | ICD-10-CM | POA: Diagnosis present

## 2019-04-02 DIAGNOSIS — G4730 Sleep apnea, unspecified: Secondary | ICD-10-CM | POA: Diagnosis present

## 2019-04-02 DIAGNOSIS — Z961 Presence of intraocular lens: Secondary | ICD-10-CM | POA: Diagnosis present

## 2019-04-02 DIAGNOSIS — Z9049 Acquired absence of other specified parts of digestive tract: Secondary | ICD-10-CM | POA: Diagnosis not present

## 2019-04-02 DIAGNOSIS — Z90710 Acquired absence of both cervix and uterus: Secondary | ICD-10-CM | POA: Diagnosis not present

## 2019-04-02 DIAGNOSIS — E669 Obesity, unspecified: Secondary | ICD-10-CM | POA: Diagnosis present

## 2019-04-02 DIAGNOSIS — G9763 Postprocedural seroma of a nervous system organ or structure following a nervous system procedure: Secondary | ICD-10-CM | POA: Diagnosis not present

## 2019-04-02 DIAGNOSIS — E785 Hyperlipidemia, unspecified: Secondary | ICD-10-CM | POA: Diagnosis present

## 2019-04-02 DIAGNOSIS — Z8249 Family history of ischemic heart disease and other diseases of the circulatory system: Secondary | ICD-10-CM

## 2019-04-02 DIAGNOSIS — T888XXA Other specified complications of surgical and medical care, not elsewhere classified, initial encounter: Secondary | ICD-10-CM

## 2019-04-02 DIAGNOSIS — E041 Nontoxic single thyroid nodule: Secondary | ICD-10-CM | POA: Diagnosis present

## 2019-04-02 DIAGNOSIS — Y92009 Unspecified place in unspecified non-institutional (private) residence as the place of occurrence of the external cause: Secondary | ICD-10-CM

## 2019-04-02 DIAGNOSIS — K219 Gastro-esophageal reflux disease without esophagitis: Secondary | ICD-10-CM | POA: Diagnosis present

## 2019-04-02 DIAGNOSIS — L24A9 Irritant contact dermatitis due friction or contact with other specified body fluids: Secondary | ICD-10-CM | POA: Insufficient documentation

## 2019-04-02 DIAGNOSIS — L7634 Postprocedural seroma of skin and subcutaneous tissue following other procedure: Secondary | ICD-10-CM | POA: Diagnosis present

## 2019-04-02 DIAGNOSIS — G8929 Other chronic pain: Secondary | ICD-10-CM | POA: Diagnosis present

## 2019-04-02 DIAGNOSIS — Z800 Family history of malignant neoplasm of digestive organs: Secondary | ICD-10-CM

## 2019-04-02 DIAGNOSIS — Z1159 Encounter for screening for other viral diseases: Secondary | ICD-10-CM

## 2019-04-02 DIAGNOSIS — M1990 Unspecified osteoarthritis, unspecified site: Secondary | ICD-10-CM | POA: Diagnosis present

## 2019-04-02 DIAGNOSIS — Z9841 Cataract extraction status, right eye: Secondary | ICD-10-CM

## 2019-04-02 DIAGNOSIS — Z801 Family history of malignant neoplasm of trachea, bronchus and lung: Secondary | ICD-10-CM

## 2019-04-02 DIAGNOSIS — Z7982 Long term (current) use of aspirin: Secondary | ICD-10-CM

## 2019-04-02 LAB — CBC WITH DIFFERENTIAL/PLATELET
Abs Immature Granulocytes: 0.01 10*3/uL (ref 0.00–0.07)
Basophils Absolute: 0 10*3/uL (ref 0.0–0.1)
Basophils Relative: 0 %
Eosinophils Absolute: 0.1 10*3/uL (ref 0.0–0.5)
Eosinophils Relative: 1 %
HCT: 35.7 % — ABNORMAL LOW (ref 36.0–46.0)
Hemoglobin: 11.3 g/dL — ABNORMAL LOW (ref 12.0–15.0)
Immature Granulocytes: 0 %
Lymphocytes Relative: 34 %
Lymphs Abs: 2.6 10*3/uL (ref 0.7–4.0)
MCH: 29.1 pg (ref 26.0–34.0)
MCHC: 31.7 g/dL (ref 30.0–36.0)
MCV: 92 fL (ref 80.0–100.0)
Monocytes Absolute: 0.6 10*3/uL (ref 0.1–1.0)
Monocytes Relative: 8 %
Neutro Abs: 4.3 10*3/uL (ref 1.7–7.7)
Neutrophils Relative %: 57 %
Platelets: 276 10*3/uL (ref 150–400)
RBC: 3.88 MIL/uL (ref 3.87–5.11)
RDW: 15.1 % (ref 11.5–15.5)
WBC: 7.6 10*3/uL (ref 4.0–10.5)
nRBC: 0 % (ref 0.0–0.2)

## 2019-04-02 LAB — BASIC METABOLIC PANEL
Anion gap: 7 (ref 5–15)
BUN: 8 mg/dL (ref 6–20)
CO2: 29 mmol/L (ref 22–32)
Calcium: 8.9 mg/dL (ref 8.9–10.3)
Chloride: 104 mmol/L (ref 98–111)
Creatinine, Ser: 0.69 mg/dL (ref 0.44–1.00)
GFR calc Af Amer: >60 mL/min (ref >60)
GFR calc non Af Amer: >60 mL/min (ref >60)
Glucose, Bld: 99 mg/dL (ref 70–99)
Potassium: 4.3 mmol/L (ref 3.5–5.1)
Sodium: 140 mmol/L (ref 135–145)

## 2019-04-02 LAB — SARS CORONAVIRUS 2 BY RT PCR (HOSPITAL ORDER, PERFORMED IN ~~LOC~~ HOSPITAL LAB): SARS Coronavirus 2: NEGATIVE

## 2019-04-02 MED ORDER — ALBUTEROL SULFATE (2.5 MG/3ML) 0.083% IN NEBU: 3.0000 mL | INHALATION_SOLUTION | Freq: Four times a day (QID) | RESPIRATORY_TRACT | Status: DC | PRN

## 2019-04-02 MED ORDER — FLUTICASONE PROPIONATE 50 MCG/ACT NA SUSP
1.0000 | Freq: Every day | NASAL | Status: DC | PRN
  Filled 2019-04-02: qty 16

## 2019-04-02 MED ORDER — METOPROLOL SUCCINATE ER 50 MG PO TB24
50.0000 mg | ORAL_TABLET | Freq: Every day | ORAL | Status: DC
  Administered 2019-04-02 – 2019-04-04 (×2): 50 mg via ORAL
  Filled 2019-04-02 (×3): qty 1

## 2019-04-02 MED ORDER — AMILORIDE HCL 5 MG PO TABS
5.0000 mg | ORAL_TABLET | Freq: Every day | ORAL | Status: DC
  Administered 2019-04-02 – 2019-04-04 (×3): 5 mg via ORAL
  Filled 2019-04-02 (×3): qty 1

## 2019-04-02 MED ORDER — POLYETHYLENE GLYCOL 3350 17 G PO PACK: 17.0000 g | PACK | Freq: Every day | ORAL | Status: DC | PRN

## 2019-04-02 MED ORDER — GADOBUTROL 1 MMOL/ML IV SOLN
6.0000 mL | Freq: Once | INTRAVENOUS | Status: AC | PRN
  Administered 2019-04-02: 15:00:00 6 mL via INTRAVENOUS

## 2019-04-02 MED ORDER — DIAZEPAM 5 MG PO TABS
5.0000 mg | ORAL_TABLET | Freq: Once | ORAL | Status: AC
  Administered 2019-04-02: 5 mg via ORAL
  Filled 2019-04-02: qty 1

## 2019-04-02 MED ORDER — PANTOPRAZOLE SODIUM 40 MG PO TBEC
40.0000 mg | DELAYED_RELEASE_TABLET | Freq: Two times a day (BID) | ORAL | Status: DC
  Administered 2019-04-02 – 2019-04-05 (×6): 40 mg via ORAL
  Filled 2019-04-02 (×6): qty 1

## 2019-04-02 MED ORDER — DULOXETINE HCL 30 MG PO CPEP
30.0000 mg | ORAL_CAPSULE | Freq: Every day | ORAL | Status: DC
  Administered 2019-04-02 – 2019-04-05 (×4): 30 mg via ORAL
  Filled 2019-04-02 (×4): qty 1

## 2019-04-02 MED ORDER — METHOCARBAMOL 500 MG PO TABS
500.0000 mg | ORAL_TABLET | Freq: Four times a day (QID) | ORAL | Status: DC | PRN
  Administered 2019-04-02 – 2019-04-04 (×5): 500 mg via ORAL
  Filled 2019-04-02 (×4): qty 1

## 2019-04-02 MED ORDER — MORPHINE SULFATE (PF) 2 MG/ML IV SOLN
2.0000 mg | INTRAVENOUS | Status: DC | PRN
  Administered 2019-04-03: 2 mg via INTRAVENOUS
  Filled 2019-04-02: qty 1

## 2019-04-02 MED ORDER — MORPHINE SULFATE (PF) 4 MG/ML IV SOLN: 6.0000 mg | INTRAVENOUS | Status: DC | PRN

## 2019-04-02 MED ORDER — LORATADINE 10 MG PO TABS
10.0000 mg | ORAL_TABLET | Freq: Every day | ORAL | Status: DC
  Administered 2019-04-02 – 2019-04-05 (×4): 10 mg via ORAL
  Filled 2019-04-02 (×4): qty 1

## 2019-04-02 MED ORDER — POTASSIUM CHLORIDE IN NACL 20-0.9 MEQ/L-% IV SOLN
INTRAVENOUS | Status: DC
  Administered 2019-04-02: 23:00:00 via INTRAVENOUS
  Filled 2019-04-02 (×3): qty 1000

## 2019-04-02 MED ORDER — ASPIRIN EC 81 MG PO TBEC
81.0000 mg | DELAYED_RELEASE_TABLET | Freq: Every day | ORAL | Status: DC
  Administered 2019-04-02 – 2019-04-05 (×4): 81 mg via ORAL
  Filled 2019-04-02 (×4): qty 1

## 2019-04-02 MED ORDER — MORPHINE SULFATE (PF) 4 MG/ML IV SOLN: 4.0000 mg | INTRAVENOUS | Status: DC | PRN

## 2019-04-02 MED ORDER — LOSARTAN POTASSIUM 50 MG PO TABS
100.0000 mg | ORAL_TABLET | Freq: Every day | ORAL | Status: DC
  Administered 2019-04-02 – 2019-04-04 (×3): 100 mg via ORAL
  Filled 2019-04-02 (×3): qty 2

## 2019-04-02 MED ORDER — AMLODIPINE BESYLATE 5 MG PO TABS
5.0000 mg | ORAL_TABLET | Freq: Every day | ORAL | Status: DC
  Administered 2019-04-02 – 2019-04-04 (×3): 5 mg via ORAL
  Filled 2019-04-02 (×3): qty 1

## 2019-04-02 MED ORDER — MORPHINE SULFATE (PF) 4 MG/ML IV SOLN
4.0000 mg | Freq: Once | INTRAVENOUS | Status: AC
  Administered 2019-04-02: 13:00:00 4 mg via INTRAVENOUS
  Filled 2019-04-02: qty 1

## 2019-04-02 MED ORDER — PREGABALIN 75 MG PO CAPS
75.0000 mg | ORAL_CAPSULE | Freq: Three times a day (TID) | ORAL | Status: DC
  Administered 2019-04-02 – 2019-04-05 (×9): 75 mg via ORAL
  Filled 2019-04-02 (×8): qty 1

## 2019-04-02 MED ORDER — MORPHINE SULFATE (PF) 2 MG/ML IV SOLN
2.0000 mg | Freq: Once | INTRAVENOUS | Status: AC
  Administered 2019-04-02: 2 mg via INTRAVENOUS
  Filled 2019-04-02: qty 1

## 2019-04-02 MED ORDER — HYDROCODONE-ACETAMINOPHEN 10-325 MG PO TABS
1.0000 | ORAL_TABLET | ORAL | Status: DC | PRN
  Administered 2019-04-02 – 2019-04-05 (×7): 1 via ORAL
  Filled 2019-04-02 (×6): qty 1

## 2019-04-02 MED ORDER — VITAMIN D (ERGOCALCIFEROL) 1.25 MG (50000 UNIT) PO CAPS
50000.0000 [IU] | ORAL_CAPSULE | ORAL | Status: DC
  Administered 2019-04-04: 50000 [IU] via ORAL
  Filled 2019-04-02: qty 1

## 2019-04-02 NOTE — ED Provider Notes (Signed)
[place] EMERGENCY DEPARTMENT Provider Note   CSN: [id number] Arrival date & time: 04/02/19  1129     History   Chief Complaint Chief Complaint  Patient presents with  . Back Pain    HPI Jodi Bowman is a 58 y.o. female.     HPI  Patient is a 58 year old female past medical history of HTN, HLD, CVA, recent lumbar decompression and fusion at L4-L5 and L5-S1 on 03-04-2019 presenting for left back pain, drainage from surgical site, and left leg pain and weakness.  Patient reports that persistently since her surgery she has had some numbness radiating down her left leg.  Over the past week she has noticed some serosanguineous drainage from her wound site on her clothing.  She reports she has had increased surgical site pain and was placed on Keflex by her neurosurgeon.  She reports she has had increased pain rating down the left leg as well as new weakness.  She cannot tell if it is true weakness or if it is due to the pain.  She reports decreased ability to do therapy due to her symptoms.  She denies fevers, chills, nausea, vomiting, chest pain or shortness of breath.  Past Medical History:  Diagnosis Date  . Arthritis   . Calculus of gallbladder without mention of cholecystitis or obstruction   . Dizziness   . Fibromyalgia   . GERD (gastroesophageal reflux disease)   . Goiter   . Hypercholesteremia   . Hypertension   . Lower back pain   . Migraine   . Nontoxic uninodular goiter    sees dr vollmer at [facility]  . Obesity   . PONV (postoperative nausea and vomiting)   . Sleep apnea    STOPBANG=5  . Stroke [facility]) 2000    Patient Active Problem List   Diagnosis Date Noted  . Infective otitis externa of left ear   . Left otitis media   . AKI (acute kidney injury) ([place])   . Urinary retention   . Orthostasis   . Acute blood loss anemia   . Fibromyalgia   . Chronic pain syndrome   . Lumbar radiculopathy 03/08/2019  . Orthostatic hypotension  03/06/2019  . Postoperative urinary retention 03/06/2019  . Lumbar foraminal stenosis 03/04/2019  . S/P exploratory laparotomy 03/06/2018  . Bowel obstruction ([place]) 03/06/2018  . Chest pain, rule out acute myocardial infarction 12/17/2017  . Hypokalemia 12/17/2017  . Acute lower UTI 12/17/2017  . Vertigo 04/30/2017  . Small vessel disease, cerebrovascular 04/30/2017  . Chronic low back pain 08/01/2015  . Right hip pain 08/01/2015  . Abnormality of gait 08/01/2015  . Left leg weakness   . Low back pain with radiation   . Syncope 07/30/2014  . Atypical chest pain 03/12/2014  . Lap chole [facility] April 2013 01/29/2012  . Gallstones 12/04/2011  . Thyroid nodule-non neoplastic goiter by needle aspiration 12/04/2011  . GLUCOSE INTOLERANCE 03/12/2010  . DYSLIPIDEMIA 03/12/2010  . Chronic migraine 03/12/2010  . Carotid stenosis 03/12/2010  . CEREBROVASCULAR ACCIDENT 03/12/2010  . LIPOMA 01/29/2010  . HEADACHE 01/29/2010  . ANKLE INJURY, RIGHT 04/12/2009  . PHARYNGITIS 03/21/2009  . Backache 03/01/2009  . ALLERGIC RHINITIS 03/17/2007  . LOW BACK PAIN 03/17/2007  . Essential hypertension 01/01/2007  . ANXIETY STATE NOS 09/11/2005    Past Surgical History:  Procedure Laterality Date  . ABDOMINAL HYSTERECTOMY    . BOWEL RESECTION N/A 03/06/2018   Procedure: SMALL BOWEL ANASTAMOSIS;  Surgeon: Clovis Riley, MD;  Location: [facility] OR;  Service: General;  Laterality: N/A;  . CATARACT EXTRACTION W/ INTRAOCULAR LENS IMPLANT Bilateral   . CESAREAN SECTION  yrs ago   done x 2  . CHOLECYSTECTOMY  01/05/2012   Procedure: LAPAROSCOPIC CHOLECYSTECTOMY WITH INTRAOPERATIVE CHOLANGIOGRAM;  Surgeon: Pedro Earls, MD;  Location: [facility] ORS;  Service: General;  Laterality: N/A;  . COLONOSCOPY  10/08/2012   Procedure: COLONOSCOPY;  Surgeon: Beryle Beams, MD;  Location: [facility] ENDOSCOPY;  Service: Endoscopy;  Laterality: N/A;  . KNEE ARTHROSCOPY  one 1995 and 1 in 1997   both knees done  . LAPAROSCOPY N/A  03/06/2018   Procedure: LAPAROSCOPY DIAGNOSTIC WITH LYSIS OF ADHESIONS;  Surgeon: Clovis Riley, MD;  Location: [facility];  Service: General;  Laterality: N/A;  . LAPAROTOMY N/A 03/06/2018   Procedure: EXPLORATORY LAPAROTOMY, RESECTION OF DISTAL ROUX, CLOSURE OF INTERNAL HERNIA;  Surgeon: Clovis Riley, MD;  Location: [facility];  Service: General;  Laterality: N/A;  . POSTERIOR LUMBAR FUSION  03/04/2019  . surgery for endometriosis  yrs ago  . thryoid biopsy  December 01, 2011    at [facility]     OB History   No obstetric history on file.      Home Medications    Prior to Admission medications   Medication Sig Start Date End Date Taking? Authorizing Provider  acetaminophen (TYLENOL) 325 MG tablet Take 2 tablets (650 mg total) by mouth every 4 (four) hours as needed for mild pain ((score 1 to 3) or temp > 100.5). 03/15/19   Angiulli, Lavon Paganini, PA-C  aMILoride (MIDAMOR) 5 MG tablet Take 1 tablet (5 mg total) by mouth daily. 03/15/19   Angiulli, Lavon Paganini, PA-C  amLODipine (NORVASC) 5 MG tablet Take 1 tablet (5 mg total) by mouth daily. 03/15/19   Angiulli, Lavon Paganini, PA-C  aspirin EC 81 MG tablet Take 81 mg by mouth daily.    [provider]  bisacodyl (DULCOLAX) 10 MG suppository Place 1 suppository (10 mg total) rectally daily as needed for moderate constipation. 03/08/19   Viona Gilmore D, NP  Cholecalciferol 50000 units capsule Take 50,000 Units by mouth 3 (three) times a week.     [provider]  DULoxetine (CYMBALTA) 30 MG capsule Take 1 capsule (30 mg total) by mouth daily. 03/16/19   Angiulli, Lavon Paganini, PA-C  fluticasone (FLONASE) 50 MCG/ACT nasal spray Place 1 spray into both nostrils daily as needed for allergies.  11/08/17   [provider]  folic acid (FOLVITE) 1 MG tablet Take 1 mg by mouth daily. 10/14/17   [provider]  HYDROcodone-acetaminophen (NORCO) 10-325 MG tablet Take 1 tablet by mouth every 4 (four) hours as needed for moderate pain ((score 4 to 6)).  03/15/19   Angiulli, Lavon Paganini, PA-C  loratadine (CLARITIN) 10 MG tablet Take 10 mg by mouth daily.     [provider]  losartan (COZAAR) 100 MG tablet Take 1 tablet (100 mg total) by mouth daily. 03/15/19   Angiulli, Lavon Paganini, PA-C  methocarbamol (ROBAXIN) 500 MG tablet Take 1 tablet (500 mg total) by mouth every 6 (six) hours as needed for muscle spasms. 03/15/19   Angiulli, Lavon Paganini, PA-C  metoprolol succinate (TOPROL-XL) 50 MG 24 hr tablet Take 1 tablet (50 mg total) by mouth daily. Take with or immediately following a meal. 03/16/19   Angiulli, Lavon Paganini, PA-C  nitroGLYCERIN (NITROSTAT) 0.4 MG SL tablet Place 1 tablet (0.4 mg total) under the tongue every 5 (five)  minutes x 3 doses as needed for chest pain. Patient taking differently: Place 0.4 mg under the tongue every 5 (five) minutes as needed for chest pain. Maximum 3 doses 03/13/14   Charolette Forward, MD  pantoprazole (PROTONIX) 40 MG tablet Take 1 tablet (40 mg total) by mouth 2 (two) times daily. 03/15/19   Angiulli, Lavon Paganini, PA-C  polyethylene glycol (MIRALAX / GLYCOLAX) 17 g packet Take 17 g by mouth daily as needed for mild constipation. 03/08/19   Viona Gilmore D, NP  pregabalin (LYRICA) 75 MG capsule Take 1 capsule (75 mg total) by mouth 3 (three) times daily. 03/15/19   Angiulli, Lavon Paganini, PA-C  Vitamin D, Ergocalciferol, (DRISDOL) 1.25 MG (50000 UT) CAPS capsule Take 1 capsule (50,000 Units total) by mouth every Monday, Wednesday, and Friday. 03/16/19   Angiulli, Lavon Paganini, PA-C  metoprolol (LOPRESSOR) 50 MG tablet Take 50 mg by mouth 2 (two) times daily.    12/04/11  [provider]  simvastatin (ZOCOR) 20 MG tablet Take 20 mg by mouth at bedtime.    12/04/11  [provider]    Family History Family History  Problem Relation Age of Onset  . Diabetes Mother   . Hypertension Mother   . Transient ischemic attack Mother   . Seizures Mother   . Dementia Mother   . Other Father        MVA  . Cancer Brother         colon and lung  . Cancer Maternal Grandmother        colon    Social History Social History   Tobacco Use  . Smoking status: Never Smoker  . Smokeless tobacco: Never Used  Substance Use Topics  . Alcohol use: Not Currently    Alcohol/week: 0.0 standard drinks  . Drug use: No     Allergies   Shrimp [shellfish allergy] and Naproxen   Review of Systems Review of Systems  Constitutional: Negative for chills and fever.  HENT: Negative for congestion and sore throat.   Eyes: Negative for visual disturbance.  Respiratory: Negative for chest tightness and shortness of breath.   Cardiovascular: Negative for chest pain.  Gastrointestinal: Negative for abdominal pain, nausea and vomiting.  Genitourinary: Negative for dysuria and flank pain.  Musculoskeletal: Positive for back pain and gait problem. Negative for myalgias.  Skin: Positive for wound. Negative for rash.  Neurological: Negative for dizziness, syncope and light-headedness.     Physical Exam Updated Vital Signs BP (!) 143/64   Pulse 61   Temp 98.4 F (36.9 C) (Oral)   Resp 14   Ht 4\' 11"  (1.499 m)   Wt 68 kg   SpO2 98%   BMI 30.30 kg/m   Physical Exam Vitals signs and nursing note reviewed.  Constitutional:      General: She is not in acute distress.    Appearance: She is well-developed.  HENT:     Head: Normocephalic and atraumatic.  Eyes:     Conjunctiva/sclera: Conjunctivae normal.     Pupils: Pupils are equal, round, and reactive to light.  Neck:     Musculoskeletal: Normal range of motion and neck supple.  Cardiovascular:     Rate and Rhythm: Normal rate and regular rhythm.     Heart sounds: S1 normal and S2 normal. No murmur.  Pulmonary:     Effort: Pulmonary effort is normal.     Breath sounds: Normal breath sounds. No wheezing or rales.  Abdominal:  General: There is no distension.     Palpations: Abdomen is soft.     Tenderness: There is no abdominal tenderness. There is no guarding.   Musculoskeletal: Normal range of motion.        General: Tenderness present. No deformity.     Comments: Spine Exam: Inspection/Palpation: Patient has approximately 5 cm surgical site incision in the upper lumbar spine.  There is mild dehiscence of the superior and inferior portions of the surgical site.  She is tenderness surrounding it but no erythema or induration.  No obvious fluctuance palpated. Strength: 5/5 throughout RLE (hip flexion/extension, adduction/abduction; knee flexion/extension; foot dorsiflexion/plantarflexion, inversion/eversion; great toe inversion). Strength 4/5 in LLE and pain reproduced with examination maneuvers. Sensation: Intact to light touch in proximal and distal LE bilaterally Reflexes: 2+ quadriceps and achilles reflexes Gait deferred.    Lymphadenopathy:     Cervical: No cervical adenopathy.  Skin:    General: Skin is warm and dry.     Findings: No erythema or rash.  Neurological:     Mental Status: She is alert.     Comments: Cranial nerves grossly intact. Patient moves extremities symmetrically and with good coordination.  Psychiatric:        Behavior: Behavior normal.        Thought Content: Thought content normal.        Judgment: Judgment normal.      ED Treatments / Results  Labs (all labs ordered are listed, but only abnormal results are displayed) Labs Reviewed  CBC WITH DIFFERENTIAL/PLATELET - Abnormal; Notable for the following components:      Result Value   Hemoglobin 11.3 (*)    HCT 35.7 (*)    All other components within normal limits  SARS CORONAVIRUS 2 (HOSPITAL ORDER, [facility])  BASIC METABOLIC PANEL    EKG None  Radiology Mr Lumbar Spine W Wo Contrast  Result Date: 04/02/2019 CLINICAL DATA:  Back pain and left leg numbness since surgery 03/04/2019. Incision site has been draining. EXAM: MRI LUMBAR SPINE WITHOUT AND WITH CONTRAST TECHNIQUE: Multiplanar and multiecho pulse sequences of the  lumbar spine were obtained without and with intravenous contrast. CONTRAST:  6 cc Gadavist COMPARISON:  MRI 11/16/2018 FINDINGS: Segmentation: There are five lumbar type vertebral bodies. The last full intervertebral disc space is labeled L5-S1. This correlates with the prior MRI examination. Alignment:  Normal Vertebrae: Since the prior MRI there have been surgical changes with pedicle screws, posterior rods and interbody fusion devices at L4-5 and L5-S1. Moderate associated artifact with the hardware. No complicating features. Conus medullaris and cauda equina: Conus extends to the L1 level. Conus and cauda equina appear normal. Paraspinal and other soft tissues: No significant paraspinal or retroperitoneal findings. There is a moderate-sized fluid collection in the laminectomy defect measuring approximately 5.5 x 4.0 x 3.0 cm. No obvious connection with the thecal sac is demonstrated. There is a large rim enhancing fluid collection in the subcutaneous fat extending from L3-S1. This measures 9.0 x 6.0 x 3.8 cm. It could be a liquified hematoma but an abscess is certainly a consideration. I do not see a definite/obvious connection between these 2 fluid collections. Disc levels: L1-2: No significant findings. L2-3: No significant findings. L3-4: Mild facet disease. No disc protrusions, spinal or foraminal stenosis. L4-5: Surgical changes with wide decompressive laminectomy. Posterior and interbody fusion hardware noted. No obvious spinal or foraminal stenosis. Large postoperative fluid collection in the laminectomy bed. CSF leak is certainly a possibility.  L5-S1: Wide decompressive laminectomy with posterior and interbody fusion changes. Large fluid collection in the laminectomy bed and in the subcutaneous tissues. IMPRESSION: 1. Postoperative changes with posterior and interbody fusion at L4-5 and L5-S1. Wide decompressive laminectomies without findings for spinal or foraminal stenosis. 2. The other intervertebral  disc spaces are unremarkable. I do not see a definite cause for the patient's left leg pain. 3. Two sizable postoperative fluid collections as detailed above. 5 cm collection in the laminectomy bed from the top of L4 to the top of S1. It could be a benign postop fluid collection but could not exclude abscess or CSF leak. I do not see a definite connection with the larger, 9 cm, rim enhancing subcutaneous lesion. That lesion could be an abscess. Aspiration may be helpful. Electronically Signed   By: Marijo Sanes M.D.   On: 04/02/2019 15:35    Procedures Procedures (including critical care time)  Medications Ordered in ED Medications  morphine 2 MG/ML injection 2 mg (has no administration in time range)  diazepam (VALIUM) tablet 5 mg (has no administration in time range)  morphine 4 MG/ML injection 4 mg (4 mg Intravenous Given 04/02/19 1240)     Initial Impression / Assessment and Plan / ED Course  I have reviewed the triage vital signs and the nursing notes.  Pertinent labs & imaging results that were available during my care of the patient were reviewed by me and considered in my medical decision making (see chart for details).  Clinical Course as of Apr 01 1732  Sat Apr 02, 2019  1231 Spoke with Dr. Zada Finders who will come see pt. Appreciate his involvement.    [AM]  3086 Improving after post operative blood loss.   Hemoglobin(!): 11.3 [AM]  1345 Pt reporting some ongoing pain. Will order morphine and diazepam prior to MRI.    [AM]  5784 Spoke with Dr. Zada Finders who states that pt can be NPO at midnight. Does not recommend antibiotics at this time.    [AM]  1732 Spoke with Dr. Evangeline Gula who will admit pt. Appreciate her involvement.    [AM]    Clinical Course User Index [AM] Albesa Seen, PA-C       This is a 58 year old female with recent neurosurgical procedure of lumbar decompression and fusion at L4-L5 and L5-S1 presenting for wound dehiscence, back pain, and radiation of  pain down the left leg.  She also has associated neurologic symptoms of some weakness and numbness of the left leg.  Differential diagnosis includes postoperative infection, seroma, discitis.  Patient evaluated at bedside by Dr. Venetia Constable.  Patient to receive MRI.  Lab work showing improving anemia.  No leukocytosis.  MRI showing:   1. Postoperative changes with posterior and interbody fusion at L4-5  and L5-S1. Wide decompressive laminectomies without findings for  spinal or foraminal stenosis.  2. The other intervertebral disc spaces are unremarkable. I do not  see a definite cause for the patient's left leg pain.  3. Two sizable postoperative fluid collections as detailed above. 5  cm collection in the laminectomy bed from the top of L4 to the top  of S1. It could be a benign postop fluid collection but could not  exclude abscess or CSF leak. I do not see a definite connection with  the larger, 9 cm, rim enhancing subcutaneous lesion. That lesion  could be an abscess. Aspiration may be helpful.   Dr. Zada Finders will take the patient to the operating room tomorrow to  washout the surgical site.  Does not recommend antibiotics in the meantime.  Recommends medical admission.  Appreciate the involvement of these teams.  This is a supervised visit with Dr. Deno Etienne. Evaluation, management, and discharge planning discussed with this attending physician.  Final Clinical Impressions(s) / ED Diagnoses   Final diagnoses:  S/P laminectomy with spinal fusion  Fluid collection at surgical site, initial encounter    ED Discharge Orders    None       Tamala Julian 04/02/19 [place], [place], DO 04/02/19 1749

## 2019-04-02 NOTE — H&P (Signed)
History and Physical    Jodi Bowman YQM:[id number] DOB: 06/18/1961 DOA: 04/02/2019  PCP: Aletha Halim., PA-C  Patient coming from: Home  I have personally briefly reviewed patient's old medical records in [place]  Chief Complaint: Back pain and left leg numbness since surgery on June 5 with incision site that has been draining  HPI: Jodi Bowman is a 58 y.o. female with medical history significant of retention, hyperlipidemia, fibromyalgia, stroke without residual weakness now on aspirin who underwent lateral L4-5/L5-S1 decompressive laminectomy and foraminotomies with posterior lumbar interbody fusion on 03/04/2019 by Dr. Trenton Gammon.  Postoperatively she continues to have numbness but also is complaining of left distal foot weakness.  She had some wound dehiscence and was seen in clinic earlier this week.  She was started on cephalexin.  She denies fevers, chills, nausea, vomiting, cough, or shortness of breath.  She continues to have drainage and increasing pain in the low back and bilateral lower extremities.  She has had no recent change in her bowel or bladder function.  Not on anticoagulant.  ED Course: Seen by neurosurgery who recommended MRI of the spine which showed postoperative fluid collection in the area of surgery.  Maxie Better is for admission with surgical judgment tomorrow to washout the wound and assure sterility.  Antibiotics will be held until there is can be obtained from the site in the OR tomorrow. Review of Systems: As per HPI otherwise all other systems reviewed and  negative.   Past Medical History:  Diagnosis Date   Arthritis    Calculus of gallbladder without mention of cholecystitis or obstruction    Dizziness    Fibromyalgia    GERD (gastroesophageal reflux disease)    Goiter    Hypercholesteremia    Hypertension    Lower back pain    Migraine    Nontoxic uninodular goiter    sees dr vollmer at [facility]   Obesity    PONV  (postoperative nausea and vomiting)    Sleep apnea    STOPBANG=5   Stroke ([place]) 2000    Past Surgical History:  Procedure Laterality Date   ABDOMINAL HYSTERECTOMY     BOWEL RESECTION N/A 03/06/2018   Procedure: SMALL BOWEL ANASTAMOSIS;  Surgeon: Clovis Riley, MD;  Location: [place];  Service: General;  Laterality: N/A;   CATARACT EXTRACTION W/ INTRAOCULAR LENS IMPLANT Bilateral    CESAREAN SECTION  yrs ago   done x 2   CHOLECYSTECTOMY  01/05/2012   Procedure: LAPAROSCOPIC CHOLECYSTECTOMY WITH INTRAOPERATIVE CHOLANGIOGRAM;  Surgeon: Pedro Earls, MD;  Location: [facility];  Service: General;  Laterality: N/A;   COLONOSCOPY  10/08/2012   Procedure: COLONOSCOPY;  Surgeon: Beryle Beams, MD;  Location: [facility];  Service: Endoscopy;  Laterality: N/A;   KNEE ARTHROSCOPY  one 1995 and 1 in 1997   both knees done   LAPAROSCOPY N/A 03/06/2018   Procedure: LAPAROSCOPY DIAGNOSTIC WITH LYSIS OF ADHESIONS;  Surgeon: Clovis Riley, MD;  Location: [facility];  Service: General;  Laterality: N/A;   LAPAROTOMY N/A 03/06/2018   Procedure: EXPLORATORY LAPAROTOMY, RESECTION OF DISTAL ROUX, CLOSURE OF INTERNAL HERNIA;  Surgeon: Clovis Riley, MD;  Location: [facility];  Service: General;  Laterality: N/A;   POSTERIOR LUMBAR FUSION  03/04/2019   surgery for endometriosis  yrs ago   thryoid biopsy  December 01, 2011    at [place] History Narrative   Lives at home with family.  Right-handed.   No caffeine use.     reports that she has never smoked. She has never used smokeless tobacco. She reports previous alcohol use. She reports that she does not use drugs.  Allergies  Allergen Reactions   Shrimp [Shellfish Allergy] Anaphylaxis   Naproxen Other (See Comments)    Makes stomach cramp and burn badly    Family History  Problem Relation Age of Onset   Diabetes Mother    Hypertension Mother    Transient ischemic attack Mother    Seizures Mother     Dementia Mother    Other Father        MVA   Cancer Brother        colon and lung   Cancer Maternal Grandmother        colon     Prior to Admission medications   Medication Sig Start Date End Date Taking? Authorizing Provider  acetaminophen (TYLENOL) 325 MG tablet Take 2 tablets (650 mg total) by mouth every 4 (four) hours as needed for mild pain ((score 1 to 3) or temp > 100.5). 03/15/19  Yes Angiulli, Lavon Paganini, PA-C  albuterol (PROVENTIL HFA) 108 (90 Base) MCG/ACT inhaler Inhale 1-2 puffs into the lungs every 6 (six) hours as needed for wheezing or shortness of breath.   Yes [provider]  aMILoride (MIDAMOR) 5 MG tablet Take 1 tablet (5 mg total) by mouth daily. 03/15/19  Yes Angiulli, Lavon Paganini, PA-C  amLODipine (NORVASC) 5 MG tablet Take 1 tablet (5 mg total) by mouth daily. 03/15/19  Yes Angiulli, Lavon Paganini, PA-C  aspirin EC 81 MG tablet Take 81 mg by mouth daily.   Yes [provider]  DULoxetine (CYMBALTA) 30 MG capsule Take 1 capsule (30 mg total) by mouth daily. 03/16/19  Yes Angiulli, Lavon Paganini, PA-C  fluticasone (FLONASE) 50 MCG/ACT nasal spray Place 1 spray into both nostrils daily as needed for allergies.  11/08/17  Yes [provider]  folic acid (FOLVITE) 1 MG tablet Take 1 mg by mouth daily. 10/14/17  Yes [provider]  HYDROcodone-acetaminophen (NORCO) 10-325 MG tablet Take 1 tablet by mouth every 4 (four) hours as needed for moderate pain ((score 4 to 6)). 03/15/19  Yes Angiulli, Lavon Paganini, PA-C  loratadine (CLARITIN) 10 MG tablet Take 10 mg by mouth daily.    Yes [provider]  losartan (COZAAR) 100 MG tablet Take 1 tablet (100 mg total) by mouth daily. 03/15/19  Yes Angiulli, Lavon Paganini, PA-C  methocarbamol (ROBAXIN) 500 MG tablet Take 1 tablet (500 mg total) by mouth every 6 (six) hours as needed for muscle spasms. 03/15/19  Yes Angiulli, Lavon Paganini, PA-C  metoprolol succinate (TOPROL-XL) 50 MG 24 hr tablet Take 1 tablet (50 mg total)  by mouth daily. Take with or immediately following a meal. Patient taking differently: Take 50 mg by mouth at bedtime. Take with or immediately following a meal. 03/16/19  Yes Angiulli, Lavon Paganini, PA-C  nitroGLYCERIN (NITROSTAT) 0.4 MG SL tablet Place 1 tablet (0.4 mg total) under the tongue every 5 (five) minutes x 3 doses as needed for chest pain. Patient taking differently: Place 0.4 mg under the tongue every 5 (five) minutes as needed for chest pain (MAXIMUM OF 3 DOSES).  03/13/14  Yes Charolette Forward, MD  pantoprazole (PROTONIX) 40 MG tablet Take 1 tablet (40 mg total) by mouth 2 (two) times daily. 03/15/19  Yes Angiulli, Lavon Paganini, PA-C  pregabalin (LYRICA) 75 MG capsule Take 1 capsule (  75 mg total) by mouth 3 (three) times daily. 03/15/19  Yes Angiulli, Lavon Paganini, PA-C  Vitamin D, Ergocalciferol, (DRISDOL) 1.25 MG (50000 UT) CAPS capsule Take 1 capsule (50,000 Units total) by mouth every Monday, Wednesday, and Friday. 03/16/19  Yes Angiulli, Lavon Paganini, PA-C  bisacodyl (DULCOLAX) 10 MG suppository Place 1 suppository (10 mg total) rectally daily as needed for moderate constipation. Patient not taking: Reported on 04/02/2019 03/08/19   Viona Gilmore D, NP  polyethylene glycol (MIRALAX / GLYCOLAX) 17 g packet Take 17 g by mouth daily as needed for mild constipation. Patient not taking: Reported on 04/02/2019 03/08/19   Viona Gilmore D, NP  metoprolol (LOPRESSOR) 50 MG tablet Take 50 mg by mouth 2 (two) times daily.    12/04/11  [provider]  simvastatin (ZOCOR) 20 MG tablet Take 20 mg by mouth at bedtime.    12/04/11  [provider]    Physical Exam:  Constitutional: NAD, calm, comfortable Vitals:   04/02/19 1139 04/02/19 1149  BP:  (!) 143/64  Pulse:  61  Resp:  14  Temp:  98.4 F (36.9 C)  TempSrc:  Oral  SpO2:  98%  Weight: 68 kg   Height: 4\' 11"  (1.499 m)    Eyes: PERRL, lids and conjunctivae normal ENMT: Mucous membranes are moist. Posterior pharynx clear of any exudate or  lesions.Normal dentition.  Neck: normal, supple, no masses, no thyromegaly Respiratory: clear to auscultation bilaterally, no wheezing, no crackles. Normal respiratory effort. No accessory muscle use.  Cardiovascular: Regular rate and rhythm, no murmurs / rubs / gallops. No extremity edema. 2+ pedal pulses. No carotid bruits.  Abdomen: no tenderness, no masses palpated. No hepatosplenomegaly. Bowel sounds positive.  Musculoskeletal: no clubbing / cyanosis. No joint deformity upper and lower extremities. Good ROM, no contractures. Normal muscle tone.  Back: Exquisitely tender about the incision site I was unable to express any fluid.  Very mild superficial dehiscence. Skin: no rashes, lesions, ulcers. No induration Neurologic: CN 2-12 grossly intact. Sensation intact, DTR normal. Strength 5/5 in all 4.  Psychiatric: Normal judgment and insight. Alert and oriented x 3. Normal mood.    Labs on Admission: I have personally reviewed following labs and imaging studies  CBC: Recent Labs  Lab 04/02/19 1220  WBC 7.6  NEUTROABS 4.3  HGB 11.3*  HCT 35.7*  MCV 92.0  PLT 263   Basic Metabolic Panel: Recent Labs  Lab 04/02/19 1220  NA 140  K 4.3  CL 104  CO2 29  GLUCOSE 99  BUN 8  CREATININE 0.69  CALCIUM 8.9   GFR: Estimated Creatinine Clearance: 63.5 mL/min (by C-G formula based on SCr of 0.69 mg/dL).   Urine analysis:    Component Value Date/Time   COLORURINE YELLOW 03/06/2018 1251   APPEARANCEUR HAZY (A) 03/06/2018 1251   LABSPEC 1.018 03/06/2018 1251   PHURINE 6.0 03/06/2018 1251   GLUCOSEU NEGATIVE 03/06/2018 1251   HGBUR NEGATIVE 03/06/2018 1251   HGBUR negative 03/01/2009 0853   BILIRUBINUR NEGATIVE 03/06/2018 1251   KETONESUR NEGATIVE 03/06/2018 1251   PROTEINUR NEGATIVE 03/06/2018 1251   UROBILINOGEN 1.0 10/09/2014 2018   NITRITE NEGATIVE 03/06/2018 1251   LEUKOCYTESUR NEGATIVE 03/06/2018 1251    Radiological Exams on Admission: Mr Lumbar Spine W Wo  Contrast  Result Date: 04/02/2019 CLINICAL DATA:  Back pain and left leg numbness since surgery 03/04/2019. Incision site has been draining. EXAM: MRI LUMBAR SPINE WITHOUT AND WITH CONTRAST TECHNIQUE: Multiplanar and multiecho pulse sequences of the  lumbar spine were obtained without and with intravenous contrast. CONTRAST:  6 cc Gadavist COMPARISON:  MRI 11/16/2018 FINDINGS: Segmentation: There are five lumbar type vertebral bodies. The last full intervertebral disc space is labeled L5-S1. This correlates with the prior MRI examination. Alignment:  Normal Vertebrae: Since the prior MRI there have been surgical changes with pedicle screws, posterior rods and interbody fusion devices at L4-5 and L5-S1. Moderate associated artifact with the hardware. No complicating features. Conus medullaris and cauda equina: Conus extends to the L1 level. Conus and cauda equina appear normal. Paraspinal and other soft tissues: No significant paraspinal or retroperitoneal findings. There is a moderate-sized fluid collection in the laminectomy defect measuring approximately 5.5 x 4.0 x 3.0 cm. No obvious connection with the thecal sac is demonstrated. There is a large rim enhancing fluid collection in the subcutaneous fat extending from L3-S1. This measures 9.0 x 6.0 x 3.8 cm. It could be a liquified hematoma but an abscess is certainly a consideration. I do not see a definite/obvious connection between these 2 fluid collections. Disc levels: L1-2: No significant findings. L2-3: No significant findings. L3-4: Mild facet disease. No disc protrusions, spinal or foraminal stenosis. L4-5: Surgical changes with wide decompressive laminectomy. Posterior and interbody fusion hardware noted. No obvious spinal or foraminal stenosis. Large postoperative fluid collection in the laminectomy bed. CSF leak is certainly a possibility. L5-S1: Wide decompressive laminectomy with posterior and interbody fusion changes. Large fluid collection in the  laminectomy bed and in the subcutaneous tissues. IMPRESSION: 1. Postoperative changes with posterior and interbody fusion at L4-5 and L5-S1. Wide decompressive laminectomies without findings for spinal or foraminal stenosis. 2. The other intervertebral disc spaces are unremarkable. I do not see a definite cause for the patient's left leg pain. 3. Two sizable postoperative fluid collections as detailed above. 5 cm collection in the laminectomy bed from the top of L4 to the top of S1. It could be a benign postop fluid collection but could not exclude abscess or CSF leak. I do not see a definite connection with the larger, 9 cm, rim enhancing subcutaneous lesion. That lesion could be an abscess. Aspiration may be helpful. Electronically Signed   By: Marijo Sanes M.D.   On: 04/02/2019 15:35      Assessment/Plan Principal Problem:   Drainage from wound Active Problems:   Chronic low back pain   Essential hypertension   Fibromyalgia    1.  Fluid collection in area of wound area of decompressive laminectomy: Patient seen by Dr. Venetia Constable who plans to take her to the operating room tomorrow to washout the area and obtain cultures.  Antibiotics to be held unless she becomes unstable tonight.  I have ordered pain medication both IV and orally as she will be n.p.o. after midnight.  2.  Chronic low back pain: Noted.  She is on multiple medications including muscle relaxers, pain medications and Lyrica.  3.  Essential hypertension: Continue home medications.  4.  Fibromyalgia continue home medication management.  DVT prophylaxis: SCDs as surgery is planned in a.m. Code Status: Full code Family Communication: Retains capacity no family contacted Disposition Plan: Likely home in 3 to 4 days Consults called: Dr. Venetia Constable from neurosurgery Admission status: Inpatient Admit - It is my clinical opinion that admission to INPATIENT is reasonable and necessary because of the expectation that this patient  will require hospital care that crosses at least 2 midnights to treat this condition based on the medical complexity of the problems presented.  Given  the aforementioned information, the predictability of an adverse outcome is felt to be significant.   Lady Deutscher MD FACP [facility] Pager [phone]  How to contact the [facility] Attending or Consulting provider [place] or covering provider during after hours [place], for this patient?  1. Check the care team in [facility] and look for a) attending/consulting [facility] provider listed and b) the [facility] team listed 2. Log into [email or web address] and use [place]'s universal password to access. If you do not have the password, please contact the hospital operator. 3. Locate the [facility] provider you are looking for under [facility] and page to a number that you can be directly reached. 4. If you still have difficulty reaching the provider, please page the [facility] (Director on Call) for the Hospitalists listed on amion for assistance.  If 7PM-7AM, please contact night-coverage [email or web address] Password [facility]  04/02/2019, 6:03 PM

## 2019-04-02 NOTE — ED Triage Notes (Signed)
Pt coming from home. Pt complaining of back pain and left leg numbness since surgery June 5th. Pt states the incision site has been draining.

## 2019-04-02 NOTE — ED Notes (Signed)
ED TO INPATIENT HANDOFF REPORT  ED Nurse Name and Phone #:  Chester Holstein [phone]  S Name/Age/Gender Jodi Bowman 58 y.o. female Room/Bed: 038C/038C  Code Status   Code Status: Full Code  Home/SNF/Other Home Patient oriented to: self, place, time and situation Is this baseline? Yes   Triage Complete: Triage complete  Chief Complaint Post-op Problems  Triage Note Pt coming from home. Pt complaining of back pain and left leg numbness since surgery June 5th. Pt states the incision site has been draining.   Allergies Allergies  Allergen Reactions  . Shrimp [Shellfish Allergy] Anaphylaxis  . Naproxen Other (See Comments)    Makes stomach cramp and burn badly    Level of Care/Admitting Diagnosis ED Disposition    ED Disposition Condition [place]: [place] [[id number]]  Level of Care: Med-Surg [16]  Covid Evaluation: Asymptomatic Screening Protocol (No Symptoms)  Diagnosis: Drainage from wound [[id number]]  Admitting Physician: Lady Deutscher [[id number]]  Attending Physician: Lady Deutscher [[id number]]  Estimated length of stay: past midnight tomorrow  Certification:: I certify this patient will need inpatient services for at least 2 midnights  PT Class (Do Not Modify): Inpatient [101]  PT Acc Code (Do Not Modify): Private [1]       B Medical/Surgery History Past Medical History:  Diagnosis Date  . Arthritis   . Calculus of gallbladder without mention of cholecystitis or obstruction   . Dizziness   . Fibromyalgia   . GERD (gastroesophageal reflux disease)   . Goiter   . Hypercholesteremia   . Hypertension   . Lower back pain   . Migraine   . Nontoxic uninodular goiter    sees dr vollmer at [facility]  . Obesity   . PONV (postoperative nausea and vomiting)   . Sleep apnea    STOPBANG=5  . Stroke [facility]) 2000   Past Surgical History:  Procedure Laterality Date  . ABDOMINAL HYSTERECTOMY    . BOWEL RESECTION N/A 03/06/2018    Procedure: SMALL BOWEL ANASTAMOSIS;  Surgeon: Clovis Riley, MD;  Location: [facility];  Service: General;  Laterality: N/A;  . CATARACT EXTRACTION W/ INTRAOCULAR LENS IMPLANT Bilateral   . CESAREAN SECTION  yrs ago   done x 2  . CHOLECYSTECTOMY  01/05/2012   Procedure: LAPAROSCOPIC CHOLECYSTECTOMY WITH INTRAOPERATIVE CHOLANGIOGRAM;  Surgeon: Pedro Earls, MD;  Location: [facility];  Service: General;  Laterality: N/A;  . COLONOSCOPY  10/08/2012   Procedure: COLONOSCOPY;  Surgeon: Beryle Beams, MD;  Location: [facility];  Service: Endoscopy;  Laterality: N/A;  . KNEE ARTHROSCOPY  one 1995 and 1 in 1997   both knees done  . LAPAROSCOPY N/A 03/06/2018   Procedure: LAPAROSCOPY DIAGNOSTIC WITH LYSIS OF ADHESIONS;  Surgeon: Clovis Riley, MD;  Location: [place];  Service: General;  Laterality: N/A;  . LAPAROTOMY N/A 03/06/2018   Procedure: EXPLORATORY LAPAROTOMY, RESECTION OF DISTAL ROUX, CLOSURE OF INTERNAL HERNIA;  Surgeon: Clovis Riley, MD;  Location: [place];  Service: General;  Laterality: N/A;  . POSTERIOR LUMBAR FUSION  03/04/2019  . surgery for endometriosis  yrs ago  . thryoid biopsy  December 01, 2011    at [facility]     A IV Location/Drains/Wounds Patient Lines/Drains/Airways Status   Active Line/Drains/Airways    Name:   Placement date:   Placement time:   Site:   Days:   Peripheral IV 04/02/19 Right Antecubital   04/02/19    1239  Antecubital   less than 1   Incision (Closed) 03/04/19 Back   03/04/19    1135     29   Incision - 3 Ports Abdomen Umbilicus Right;Medial Right;Lateral   03/06/18    -     392          Intake/Output Last 24 hours No intake or output data in the 24 hours ending 04/02/19 1810  Labs/Imaging Results for orders placed or performed during the hospital encounter of 04/02/19 (from the past 48 hour(s))  Basic metabolic panel     Status: None   Collection Time: 04/02/19 12:20 PM  Result Value Ref Range   Sodium 140 135 - 145 mmol/L   Potassium 4.3 3.5 -  5.1 mmol/L   Chloride 104 98 - 111 mmol/L   CO2 29 22 - 32 mmol/L   Glucose, Bld 99 70 - 99 mg/dL   BUN 8 6 - 20 mg/dL   Creatinine, Ser 0.69 0.44 - 1.00 mg/dL   Calcium 8.9 8.9 - 10.3 mg/dL   GFR calc non Af Amer >60 >60 mL/min   GFR calc Af Amer >60 >60 mL/min   Anion gap 7 5 - 15    Comment: Performed at [facility], [place] [address] [place], [place] [address]  CBC with Differential     Status: Abnormal   Collection Time: 04/02/19 12:20 PM  Result Value Ref Range   WBC 7.6 4.0 - 10.5 K/uL   RBC 3.88 3.87 - 5.11 MIL/uL   Hemoglobin 11.3 (L) 12.0 - 15.0 g/dL   HCT 35.7 (L) 36.0 - 46.0 %   MCV 92.0 80.0 - 100.0 fL   MCH 29.1 26.0 - 34.0 pg   MCHC 31.7 30.0 - 36.0 g/dL   RDW 15.1 11.5 - 15.5 %   Platelets 276 150 - 400 K/uL   nRBC 0.0 0.0 - 0.2 %   Neutrophils Relative % 57 %   Neutro Abs 4.3 1.7 - 7.7 K/uL   Lymphocytes Relative 34 %   Lymphs Abs 2.6 0.7 - 4.0 K/uL   Monocytes Relative 8 %   Monocytes Absolute 0.6 0.1 - 1.0 K/uL   Eosinophils Relative 1 %   Eosinophils Absolute 0.1 0.0 - 0.5 K/uL   Basophils Relative 0 %   Basophils Absolute 0.0 0.0 - 0.1 K/uL   Immature Granulocytes 0 %   Abs Immature Granulocytes 0.01 0.00 - 0.07 K/uL    Comment: Performed at [facility] [address], [place], [place] [address]  SARS Coronavirus 2 (CEPHEID - Performed in [facility] hospital lab), Hosp Order     Status: None   Collection Time: 04/02/19  4:46 PM   Specimen: Nasopharyngeal Swab  Result Value Ref Range   SARS Coronavirus 2 NEGATIVE NEGATIVE    Comment: (NOTE) If result is NEGATIVE SARS-CoV-2 target nucleic acids are NOT DETECTED. The SARS-CoV-2 RNA is generally detectable in upper and lower  respiratory specimens during the acute phase of infection. The lowest  concentration of SARS-CoV-2 viral copies this assay can detect is 250  copies / mL. A negative result does not preclude SARS-CoV-2 infection  and should not be used as the sole basis for  treatment or other  patient management decisions.  A negative result may occur with  improper specimen collection / handling, submission of specimen other  than nasopharyngeal swab, presence of viral mutation(s) within the  areas targeted by this assay, and inadequate number of viral copies  (<  250 copies / mL). A negative result must be combined with clinical  observations, patient history, and epidemiological information. If result is POSITIVE SARS-CoV-2 target nucleic acids are DETECTED. The SARS-CoV-2 RNA is generally detectable in upper and lower  respiratory specimens dur ing the acute phase of infection.  Positive  results are indicative of active infection with SARS-CoV-2.  Clinical  correlation with patient history and other diagnostic information is  necessary to determine patient infection status.  Positive results do  not rule out bacterial infection or co-infection with other viruses. If result is PRESUMPTIVE POSTIVE SARS-CoV-2 nucleic acids MAY BE PRESENT.   A presumptive positive result was obtained on the submitted specimen  and confirmed on repeat testing.  While 2019 novel coronavirus  (SARS-CoV-2) nucleic acids may be present in the submitted sample  additional confirmatory testing may be necessary for epidemiological  and / or clinical management purposes  to differentiate between  SARS-CoV-2 and other Sarbecovirus currently known to infect humans.  If clinically indicated additional testing with an alternate test  methodology [phone]) is advised. The SARS-CoV-2 RNA is generally  detectable in upper and lower respiratory sp ecimens during the acute  phase of infection. The expected result is Negative. Fact Sheet for Patients:  StrictlyIdeas.no Fact Sheet for Healthcare Providers: BankingDealers.co.za This test is not yet approved or cleared by the [place] FDA and has been authorized for detection and/or  diagnosis of SARS-CoV-2 by FDA under an Emergency Use Authorization (EUA).  This EUA will remain in effect (meaning this test can be used) for the duration of the COVID-19 declaration under Section 564(b)(1) of the Act, 21 U.S.C. section 360bbb-3(b)(1), unless the authorization is terminated or revoked sooner. Performed at [facility], [place] [address], [place], [place] [address]    Mr Lumbar Spine W Wo Contrast  Result Date: 04/02/2019 CLINICAL DATA:  Back pain and left leg numbness since surgery 03/04/2019. Incision site has been draining. EXAM: MRI LUMBAR SPINE WITHOUT AND WITH CONTRAST TECHNIQUE: Multiplanar and multiecho pulse sequences of the lumbar spine were obtained without and with intravenous contrast. CONTRAST:  6 cc Gadavist COMPARISON:  MRI 11/16/2018 FINDINGS: Segmentation: There are five lumbar type vertebral bodies. The last full intervertebral disc space is labeled L5-S1. This correlates with the prior MRI examination. Alignment:  Normal Vertebrae: Since the prior MRI there have been surgical changes with pedicle screws, posterior rods and interbody fusion devices at L4-5 and L5-S1. Moderate associated artifact with the hardware. No complicating features. Conus medullaris and cauda equina: Conus extends to the L1 level. Conus and cauda equina appear normal. Paraspinal and other soft tissues: No significant paraspinal or retroperitoneal findings. There is a moderate-sized fluid collection in the laminectomy defect measuring approximately 5.5 x 4.0 x 3.0 cm. No obvious connection with the thecal sac is demonstrated. There is a large rim enhancing fluid collection in the subcutaneous fat extending from L3-S1. This measures 9.0 x 6.0 x 3.8 cm. It could be a liquified hematoma but an abscess is certainly a consideration. I do not see a definite/obvious connection between these 2 fluid collections. Disc levels: L1-2: No significant findings. L2-3: No significant findings. L3-4: Mild facet  disease. No disc protrusions, spinal or foraminal stenosis. L4-5: Surgical changes with wide decompressive laminectomy. Posterior and interbody fusion hardware noted. No obvious spinal or foraminal stenosis. Large postoperative fluid collection in the laminectomy bed. CSF leak is certainly a possibility. L5-S1: Wide decompressive laminectomy with posterior and interbody fusion changes. Large fluid collection  in the laminectomy bed and in the subcutaneous tissues. IMPRESSION: 1. Postoperative changes with posterior and interbody fusion at L4-5 and L5-S1. Wide decompressive laminectomies without findings for spinal or foraminal stenosis. 2. The other intervertebral disc spaces are unremarkable. I do not see a definite cause for the patient's left leg pain. 3. Two sizable postoperative fluid collections as detailed above. 5 cm collection in the laminectomy bed from the top of L4 to the top of S1. It could be a benign postop fluid collection but could not exclude abscess or CSF leak. I do not see a definite connection with the larger, 9 cm, rim enhancing subcutaneous lesion. That lesion could be an abscess. Aspiration may be helpful. Electronically Signed   By: Marijo Sanes M.D.   On: 04/02/2019 15:35    Pending Labs Unresulted Labs (From admission, onward)    Start     Ordered   04/02/19 1749  HIV antibody (Routine Testing)  Once,   STAT     04/02/19 1758          Vitals/Pain Today's Vitals   04/02/19 1138 04/02/19 1139 04/02/19 1149  BP:   (!) 143/64  Pulse:   61  Resp:   14  Temp:   98.4 F (36.9 C)  TempSrc:   Oral  SpO2:   98%  Weight:  150 lb (68 kg)   Height:  4\' 11"  (1.499 m)   PainSc: 8       Isolation Precautions No active isolations  Medications Medications  aspirin EC tablet 81 mg (has no administration in time range)  HYDROcodone-acetaminophen (NORCO) 10-325 MG per tablet 1 tablet (has no administration in time range)  aMILoride (MIDAMOR) tablet 5 mg (has no  administration in time range)  amLODipine (NORVASC) tablet 5 mg (has no administration in time range)  losartan (COZAAR) tablet 100 mg (has no administration in time range)  metoprolol succinate (TOPROL-XL) 24 hr tablet 50 mg (has no administration in time range)  DULoxetine (CYMBALTA) DR capsule 30 mg (has no administration in time range)  pantoprazole (PROTONIX) EC tablet 40 mg (has no administration in time range)  polyethylene glycol (MIRALAX / GLYCOLAX) packet 17 g (has no administration in time range)  methocarbamol (ROBAXIN) tablet 500 mg (has no administration in time range)  pregabalin (LYRICA) capsule 75 mg (has no administration in time range)  Vitamin D (Ergocalciferol) (DRISDOL) capsule 50,000 Units (has no administration in time range)  albuterol (VENTOLIN HFA) 108 (90 Base) MCG/ACT inhaler 1-2 puff (has no administration in time range)  fluticasone (FLONASE) 50 MCG/ACT nasal spray 1 spray (has no administration in time range)  loratadine (CLARITIN) tablet 10 mg (has no administration in time range)  0.9 % NaCl with KCl 20 mEq/ L  infusion (has no administration in time range)  morphine 2 MG/ML injection 2 mg (has no administration in time range)  morphine 4 MG/ML injection 4 mg (has no administration in time range)  morphine 4 MG/ML injection 6 mg (has no administration in time range)  morphine 4 MG/ML injection 4 mg (4 mg Intravenous Given 04/02/19 1240)  morphine 2 MG/ML injection 2 mg (2 mg Intravenous Given 04/02/19 1355)  diazepam (VALIUM) tablet 5 mg (5 mg Oral Given 04/02/19 1354)  gadobutrol (GADAVIST) 1 MMOL/ML injection 6 mL (6 mLs Intravenous Contrast Given 04/02/19 1522)    Mobility walks with device Low fall risk   Focused Assessments Cardiac Assessment Handoff:  Cardiac Rhythm: Normal sinus rhythm Lab Results  Component Value  Date   CKTOTAL 198 (H) 07/08/2010   CKMB 2.7 07/08/2010   TROPONINI <0.03 03/14/2019   Lab Results  Component Value Date   DDIMER  2.72 (H) 03/13/2019   Does the Patient currently have chest pain? No     R Recommendations: See Admitting Provider Note  Report given to:   Additional Notes:  Patient is COVID Negative

## 2019-04-02 NOTE — Consult Note (Addendum)
Neurosurgery Consultation  Reason for Consult: Post-op wound drainage Referring Physician: Tyrone Nine  CC: wound drainage  HPI: This is a 58 y.o. woman that had a lumbar decompression and instrumented fusion 46mo ago with Dr. Annette Stable. Preop, she had some L5 and S1 numbness. Post-op, she continues to have some numbness, but also is complaining of some left distal foot weakness. She had some wound dehiscence and was seen in clinic earlier this week and was started on cephalexin. No F/C/N/V, but she continues to have drainage and increasing pain in the low back and BLE. No recent change in bowel or bladder function. +[id number], no anticoagulant use.   ROS: A 14 point ROS was performed and is negative except as noted in the HPI.   PMHx:  Past Medical History:  Diagnosis Date  . Arthritis   . Calculus of gallbladder without mention of cholecystitis or obstruction   . Dizziness   . Fibromyalgia   . GERD (gastroesophageal reflux disease)   . Goiter   . Hypercholesteremia   . Hypertension   . Lower back pain   . Migraine   . Nontoxic uninodular goiter    sees dr vollmer at [facility]  . Obesity   . PONV (postoperative nausea and vomiting)   . Sleep apnea    STOPBANG=5  . Stroke [facility]) 2000   FamHx:  Family History  Problem Relation Age of Onset  . Diabetes Mother   . Hypertension Mother   . Transient ischemic attack Mother   . Seizures Mother   . Dementia Mother   . Other Father        MVA  . Cancer Brother        colon and lung  . Cancer Maternal Grandmother        colon   SocHx:  reports that she has never smoked. She has never used smokeless tobacco. She reports previous alcohol use. She reports that she does not use drugs.  Exam: Vital signs in last 24 hours: Temp:  [98.4 F (36.9 C)] 98.4 F (36.9 C) (07/04 1149) Pulse Rate:  [61] 61 (07/04 1149) Resp:  [14] 14 (07/04 1149) BP: (143)/(64) 143/64 (07/04 1149) SpO2:  [98 %] 98 % (07/04 1149) Weight:  [68 kg] 68 kg (07/04  1139) General: Awake, alert, cooperative, lying in bed in NAD Head: normocephalic and atruamatic HEENT: neck supple Pulmonary: breathing room air comfortably, no evidence of increased work of breathing Cardiac: RRR Abdomen: S NT ND Extremities: warm and well perfused x4 Neuro: AOx3, PERRL, EOMI, FS Strength 5/5 except for L EHL 3/5, SILT except for L L5 & S1 numbness Very tender around incision, unable to express any fluid, some mild superficial dehiscence but no obvious defect   Assessment and Plan: 58 y.o. woman s/p recent 2 lvl foraminotomies and PLIF. Pain improved x2wks then developed increasing pain and wound drainage for the past 1-2 weeks, concerning for possible underlying infection. Non-toxic appearing, no obvious drainage on exam but pt is exquisitely tender, limiting ability to probe wound.  -ordered MRI L-spine w/wo contrast, no e/o foraminal or canal stenosis, enhancing fluid collection in the subQ tissues, deeper collection in the surgical bed with less avid enhancement -given her suspicious clinical history and the large collection collection, I think it is prudent to hold antibiotics and take her to the OR tomorrow (7/5) for a wound washout to assure the sterility of that collection and repair the dehiscence -NPO p MN -hold antibiotics unless she begins to appear toxic,  will hold ABx in OR until cultures are obtained  Judith Part, MD 04/02/19 1:25 PM [facility]

## 2019-04-02 NOTE — ED Notes (Signed)
Called 5M to give report.  Receiving RN unable to take report at this time. 

## 2019-04-03 ENCOUNTER — Encounter (HOSPITAL_COMMUNITY)
Admission: EM | Disposition: A | Discharge status: Home Health | Payer: Self-pay | Source: Home / Self Care | Attending: Internal Medicine

## 2019-04-03 ENCOUNTER — Encounter (HOSPITAL_COMMUNITY): Payer: Self-pay | Admitting: Certified Registered Nurse Anesthetist

## 2019-04-03 ENCOUNTER — Inpatient Hospital Stay (HOSPITAL_COMMUNITY): Payer: Medicare Other | Admitting: Anesthesiology

## 2019-04-03 DIAGNOSIS — M797 Fibromyalgia: Secondary | ICD-10-CM

## 2019-04-03 DIAGNOSIS — G8929 Other chronic pain: Secondary | ICD-10-CM

## 2019-04-03 DIAGNOSIS — I10 Essential (primary) hypertension: Secondary | ICD-10-CM

## 2019-04-03 DIAGNOSIS — M5441 Lumbago with sciatica, right side: Secondary | ICD-10-CM

## 2019-04-03 HISTORY — PX: LUMBAR WOUND DEBRIDEMENT: SHX1988

## 2019-04-03 LAB — SURGICAL PCR SCREEN
MRSA, PCR: NEGATIVE
Staphylococcus aureus: POSITIVE — AB

## 2019-04-03 LAB — HIV ANTIBODY (ROUTINE TESTING W REFLEX): HIV Screen 4th Generation wRfx: NONREACTIVE

## 2019-04-03 SURGERY — LUMBAR WOUND DEBRIDEMENT
Anesthesia: General | Site: Spine Lumbar

## 2019-04-03 MED ORDER — ONDANSETRON HCL 4 MG/2ML IJ SOLN
INTRAMUSCULAR | Status: DC | PRN
  Administered 2019-04-03: 4 mg via INTRAVENOUS

## 2019-04-03 MED ORDER — HYDROMORPHONE HCL 1 MG/ML IJ SOLN
0.2500 mg | INTRAMUSCULAR | Status: DC | PRN
  Administered 2019-04-03 (×4): 0.5 mg via INTRAVENOUS

## 2019-04-03 MED ORDER — SCOPOLAMINE 1 MG/3DAYS TD PT72
MEDICATED_PATCH | TRANSDERMAL | Status: AC
  Filled 2019-04-03: qty 1

## 2019-04-03 MED ORDER — ACETAMINOPHEN 325 MG PO TABS
650.0000 mg | ORAL_TABLET | Freq: Four times a day (QID) | ORAL | Status: DC
  Administered 2019-04-03 – 2019-04-05 (×8): 650 mg via ORAL
  Filled 2019-04-03 (×8): qty 2

## 2019-04-03 MED ORDER — ONDANSETRON HCL 4 MG/2ML IJ SOLN
INTRAMUSCULAR | Status: AC
  Filled 2019-04-03: qty 2

## 2019-04-03 MED ORDER — LIDOCAINE 2% (20 MG/ML) 5 ML SYRINGE
INTRAMUSCULAR | Status: DC | PRN
  Administered 2019-04-03: 100 mg via INTRAVENOUS

## 2019-04-03 MED ORDER — MIDAZOLAM HCL 5 MG/5ML IJ SOLN
INTRAMUSCULAR | Status: DC | PRN
  Administered 2019-04-03: 2 mg via INTRAVENOUS

## 2019-04-03 MED ORDER — PHENYLEPHRINE 40 MCG/ML (10ML) SYRINGE FOR IV PUSH (FOR BLOOD PRESSURE SUPPORT)
PREFILLED_SYRINGE | INTRAVENOUS | Status: AC
  Filled 2019-04-03: qty 10

## 2019-04-03 MED ORDER — CEFAZOLIN SODIUM-DEXTROSE 2-3 GM-%(50ML) IV SOLR
INTRAVENOUS | Status: DC | PRN
  Administered 2019-04-03: 2 g via INTRAVENOUS

## 2019-04-03 MED ORDER — IBUPROFEN 400 MG PO TABS
400.0000 mg | ORAL_TABLET | Freq: Three times a day (TID) | ORAL | Status: DC
  Administered 2019-04-03 – 2019-04-05 (×6): 400 mg via ORAL
  Filled 2019-04-03 (×7): qty 1

## 2019-04-03 MED ORDER — MIDAZOLAM HCL 2 MG/2ML IJ SOLN
INTRAMUSCULAR | Status: AC
  Filled 2019-04-03: qty 2

## 2019-04-03 MED ORDER — HYDROCODONE-ACETAMINOPHEN 10-325 MG PO TABS
ORAL_TABLET | ORAL | Status: AC
  Filled 2019-04-03: qty 1

## 2019-04-03 MED ORDER — FENTANYL CITRATE (PF) 250 MCG/5ML IJ SOLN
INTRAMUSCULAR | Status: AC
  Filled 2019-04-03: qty 5

## 2019-04-03 MED ORDER — EPHEDRINE SULFATE 50 MG/ML IJ SOLN
INTRAMUSCULAR | Status: DC | PRN
  Administered 2019-04-03 (×2): 10 mg via INTRAVENOUS

## 2019-04-03 MED ORDER — THROMBIN 5000 UNITS EX SOLR
CUTANEOUS | Status: AC
  Filled 2019-04-03: qty 5000

## 2019-04-03 MED ORDER — ROCURONIUM BROMIDE 50 MG/5ML IV SOSY
PREFILLED_SYRINGE | INTRAVENOUS | Status: DC | PRN
  Administered 2019-04-03: 70 mg via INTRAVENOUS

## 2019-04-03 MED ORDER — LACTATED RINGERS IV SOLN
INTRAVENOUS | Status: DC | PRN
  Administered 2019-04-03: 08:00:00 via INTRAVENOUS

## 2019-04-03 MED ORDER — SODIUM CHLORIDE 0.9 % IV SOLN
INTRAVENOUS | Status: DC | PRN
  Administered 2019-04-03: 50 ug/min via INTRAVENOUS

## 2019-04-03 MED ORDER — 0.9 % SODIUM CHLORIDE (POUR BTL) OPTIME
TOPICAL | Status: DC | PRN
  Administered 2019-04-03: 1000 mL

## 2019-04-03 MED ORDER — HYDROMORPHONE HCL 1 MG/ML IJ SOLN
INTRAMUSCULAR | Status: AC
  Filled 2019-04-03: qty 1

## 2019-04-03 MED ORDER — HYDROMORPHONE HCL 1 MG/ML IJ SOLN
1.0000 mg | INTRAMUSCULAR | Status: DC | PRN
  Administered 2019-04-03: 1 mg via INTRAVENOUS
  Filled 2019-04-03: qty 1

## 2019-04-03 MED ORDER — PROPOFOL 10 MG/ML IV BOLUS
INTRAVENOUS | Status: AC
  Filled 2019-04-03: qty 20

## 2019-04-03 MED ORDER — DEXAMETHASONE SODIUM PHOSPHATE 10 MG/ML IJ SOLN
INTRAMUSCULAR | Status: DC | PRN
  Administered 2019-04-03: 10 mg via INTRAVENOUS

## 2019-04-03 MED ORDER — MEPERIDINE HCL 25 MG/ML IJ SOLN: 6.2500 mg | INTRAMUSCULAR | Status: DC | PRN

## 2019-04-03 MED ORDER — PHENYLEPHRINE HCL (PRESSORS) 10 MG/ML IV SOLN
INTRAVENOUS | Status: DC | PRN
  Administered 2019-04-03: 200 ug via INTRAVENOUS
  Administered 2019-04-03: 80 ug via INTRAVENOUS

## 2019-04-03 MED ORDER — SUGAMMADEX SODIUM 200 MG/2ML IV SOLN
INTRAVENOUS | Status: DC | PRN
  Administered 2019-04-03: 200 mg via INTRAVENOUS

## 2019-04-03 MED ORDER — METHOCARBAMOL 500 MG PO TABS
ORAL_TABLET | ORAL | Status: AC
  Filled 2019-04-03: qty 1

## 2019-04-03 MED ORDER — LIDOCAINE-EPINEPHRINE 1 %-1:100000 IJ SOLN
INTRAMUSCULAR | Status: AC
  Filled 2019-04-03: qty 1

## 2019-04-03 MED ORDER — PROPOFOL 10 MG/ML IV BOLUS
INTRAVENOUS | Status: DC | PRN
  Administered 2019-04-03: 130 mg via INTRAVENOUS

## 2019-04-03 MED ORDER — SODIUM CHLORIDE 0.9 % IV SOLN
INTRAVENOUS | Status: DC | PRN
  Administered 2019-04-03: 500 mL

## 2019-04-03 MED ORDER — CEFAZOLIN SODIUM-DEXTROSE 2-4 GM/100ML-% IV SOLN
INTRAVENOUS | Status: AC
  Filled 2019-04-03: qty 100

## 2019-04-03 MED ORDER — FENTANYL CITRATE (PF) 100 MCG/2ML IJ SOLN
INTRAMUSCULAR | Status: DC | PRN
  Administered 2019-04-03: 50 ug via INTRAVENOUS
  Administered 2019-04-03: 150 ug via INTRAVENOUS

## 2019-04-03 MED ORDER — ONDANSETRON HCL 4 MG/2ML IJ SOLN: 4.0000 mg | Freq: Once | INTRAMUSCULAR | Status: DC | PRN

## 2019-04-03 SURGICAL SUPPLY — 28 items
BAG DECANTER FOR FLEXI CONT (MISCELLANEOUS) ×2 IMPLANT
CANISTER SUCT 3000ML PPV (MISCELLANEOUS) ×2 IMPLANT
DRAPE LAPAROTOMY 100X72X124 (DRAPES) ×2 IMPLANT
DRSG OPSITE POSTOP 3X4 (GAUZE/BANDAGES/DRESSINGS) ×2 IMPLANT
DURAPREP 26ML APPLICATOR (WOUND CARE) ×2 IMPLANT
ELECT REM PT RETURN 9FT ADLT (ELECTROSURGICAL) ×2
ELECTRODE REM PT RTRN 9FT ADLT (ELECTROSURGICAL) ×1 IMPLANT
GAUZE 4X4 16PLY RFD (DISPOSABLE) IMPLANT
GAUZE SPONGE 4X4 12PLY STRL (GAUZE/BANDAGES/DRESSINGS) IMPLANT
GLOVE BIO SURGEON STRL SZ7.5 (GLOVE) ×2 IMPLANT
GLOVE BIOGEL PI IND STRL 7.5 (GLOVE) ×1 IMPLANT
GLOVE BIOGEL PI INDICATOR 7.5 (GLOVE) ×1
GOWN STRL REUS W/ TWL LRG LVL3 (GOWN DISPOSABLE) ×2 IMPLANT
GOWN STRL REUS W/ TWL XL LVL3 (GOWN DISPOSABLE) ×1 IMPLANT
GOWN STRL REUS W/TWL 2XL LVL3 (GOWN DISPOSABLE) IMPLANT
GOWN STRL REUS W/TWL LRG LVL3 (GOWN DISPOSABLE) ×4
GOWN STRL REUS W/TWL XL LVL3 (GOWN DISPOSABLE) ×2
KIT BASIN OR (CUSTOM PROCEDURE TRAY) ×2 IMPLANT
KIT TURNOVER KIT B (KITS) ×2 IMPLANT
NS IRRIG 1000ML POUR BTL (IV SOLUTION) ×2 IMPLANT
PACK LAMINECTOMY NEURO (CUSTOM PROCEDURE TRAY) ×2 IMPLANT
PAD ARMBOARD 7.5X6 YLW CONV (MISCELLANEOUS) ×6 IMPLANT
SUT MNCRL AB 3-0 PS2 18 (SUTURE) ×2 IMPLANT
SUT VIC AB 0 CT1 18XCR BRD8 (SUTURE) ×1 IMPLANT
SUT VIC AB 0 CT1 8-18 (SUTURE) ×2
SUT VIC AB 2-0 CT2 18 VCP726D (SUTURE) ×2 IMPLANT
TOWEL GREEN STERILE (TOWEL DISPOSABLE) ×2 IMPLANT
WATER STERILE IRR 1000ML POUR (IV SOLUTION) ×2 IMPLANT

## 2019-04-03 NOTE — Plan of Care (Signed)
  Problem: Activity: Goal: Risk for activity intolerance will decrease Outcome: Progressing   

## 2019-04-03 NOTE — Op Note (Signed)
PATIENT: Jodi Bowman  DAY OF SURGERY: 04/03/19   PRE-OPERATIVE DIAGNOSIS:  Wound dehiscence, post-operative wound seroma   POST-OPERATIVE DIAGNOSIS:  Wound dehiscence, post-operative wound seroma   PROCEDURE:  Lumbar wound washout and revision   SURGEON:  Surgeon(s) and Role:    Judith Part, MD - Primary   ANESTHESIA: ETGA   BRIEF HISTORY: This is a 58 year old woman who previously had a 2 level PLIF. Post-operatively, her back pain and leg pain improved, but she recently noticed a wound dehiscence with some drainage. This was treated with cephalexin and local wound care, but her pain kept progressing with continued drainage so she came to the ED. An MRI showed an underlying fluid collection, so I recommended wound exploration and revision, given her dehiscence. This was discussed with the patient as well as risks, benefits, and alternatives and wished to proceed with wound revision.   OPERATIVE DETAIL: The patient was taken to the operating room and placed on the OR table in the prone position. A formal time out was performed with two patient identifiers and confirmed the operative site. Anesthesia was induced by the anesthesia team. The operative site was marked, hair was clipped with surgical clippers, the area was then prepped and draped in a sterile fashion. The prior lumbar incision was re-opened and explored with yellow drainage c/w seroma with no evidence of purulence. The wound was explored with no areas of purulence. Non-vital tissue was debrided and hemostasis was confirmed. The wound was copiously irrigated, all counts were correct, and the incision was then closed in layers in the usual fashion. The patient was then returned to anesthesia for emergence. No apparent complications at the completion of the procedure.   EBL:  22mL   DRAINS: none   SPECIMENS: none   Judith Part, MD 04/03/19 7:50 AM

## 2019-04-03 NOTE — Transfer of Care (Signed)
Immediate Anesthesia Transfer of Care Note  Patient: Jodi Bowman  Procedure(s) Performed: LUMBAR WOUND WASHOUT (N/A Spine Lumbar)  Patient Location: PACU  Anesthesia Type:General  Level of Consciousness: awake and alert   Airway & Oxygen Therapy: Patient Spontanous Breathing and Patient connected to nasal cannula oxygen  Post-op Assessment: Report given to RN, Post -op Vital signs reviewed and stable and Patient moving all extremities X 4  Post vital signs: Reviewed and stable  Last Vitals:  Vitals Value Taken Time  BP 132/70 04/03/19 0909  Temp    Pulse 74 04/03/19 0913  Resp 9 04/03/19 0913  SpO2 98 % 04/03/19 0913  Vitals shown include unvalidated device data.  Last Pain:  Vitals:   04/03/19 0546  TempSrc:   PainSc: 4       Patients Stated Pain Goal: 3 (34/94/94 4739)  Complications: No apparent anesthesia complications

## 2019-04-03 NOTE — Progress Notes (Signed)
Bodenheimer paged to notify of pt refusal of metoprolol. BP 96/56, P 61.

## 2019-04-03 NOTE — Brief Op Note (Signed)
04/03/2019  12:02 PM  PATIENT:  Jodi Bowman  58 y.o. female  PRE-OPERATIVE DIAGNOSIS:  lumbar wound  POST-OPERATIVE DIAGNOSIS:  lumbar seroma  PROCEDURE:  Procedure(s): LUMBAR WOUND WASHOUT (N/A)  SURGEON:  Surgeon(s) and Role:    * Jasmon Graffam, Joyice Faster, MD - Primary  PHYSICIAN ASSISTANT:   ASSISTANTS: none   ANESTHESIA:   general  EBL:  10 mL   BLOOD ADMINISTERED:none  DRAINS: none   LOCAL MEDICATIONS USED:  LIDOCAINE   SPECIMEN:  No Specimen  DISPOSITION OF SPECIMEN:  N/A  COUNTS:  YES  TOURNIQUET:  * No tourniquets in log *  DICTATION: .Note written in EPIC  PLAN OF CARE: Admit to inpatient   PATIENT DISPOSITION:  PACU - hemodynamically stable.   Delay start of Pharmacological VTE agent (>24hrs) due to surgical blood loss or risk of bleeding: yes

## 2019-04-03 NOTE — Anesthesia Procedure Notes (Signed)
Procedure Name: Intubation Date/Time: 04/03/2019 8:04 AM Performed by: Inda Coke, CRNA Pre-anesthesia Checklist: Patient identified, Emergency Drugs available, Suction available and Patient being monitored Patient Re-evaluated:Patient Re-evaluated prior to induction Oxygen Delivery Method: Circle System Utilized Preoxygenation: Pre-oxygenation with 100% oxygen Induction Type: IV induction Ventilation: Mask ventilation without difficulty Laryngoscope Size: Mac and 4 Grade View: Grade I Tube type: Oral Tube size: 7.5 mm Number of attempts: 1 Airway Equipment and Method: Stylet and Oral airway Placement Confirmation: ETT inserted through vocal cords under direct vision,  positive ETCO2 and breath sounds checked- equal and bilateral Secured at: 21 cm Tube secured with: Tape Dental Injury: Teeth and Oropharynx as per pre-operative assessment

## 2019-04-03 NOTE — Progress Notes (Signed)
PROGRESS NOTE    Jodi Bowman  LNL:[id number] DOB: 1961-09-26 DOA: 04/02/2019 PCP: Aletha Halim., PA-C    Brief Narrative:  58 year old female who presented with back pain and left leg numbness.  She had a recent, June 5, lateral L4-5/L5-S1 decompressive laminectomy and foraminotomies with posterior lumbar interbody fusion.  It was a complicated by wound dehiscence and local infection.  Started on antibiotic therapy as an outpatient.  She continued to have persistent pain and local wound drainage associated with bilateral lower extremities.  On her initial physical examination blood pressure 143/64, heart rate 61, respirate 14, temperature 98.4, oxygen saturation 98%, her lungs are clear to auscultation bilaterally, heart S1-S2 present rhythmic, the abdomen was soft nontender, no lower extremity edema.  Sodium 140, potassium 4.3, chloride 104, bicarb 29, glucose 99, BUN 8, creatinine 0.69, white count 7.6, hemoglobin 11.3, hematocrit 35.2, platelets 276.  COVID-19 negative.  Lumbar spine MRI showed postoperative changes, 2 post operative fluid collections, in the laminectomy bed and L3-S1.   Patient was admitted to the hospital working diagnosis of severe back pain due to postoperative fluid collections/ lumbar seroma.   Assessment & Plan:   Principal Problem:   Drainage from wound Active Problems:   Essential hypertension   Chronic low back pain   Fibromyalgia   1. Post operative lumbar seromas. Patient is sp lumbar wound washout, will continue pain control with as needed hydromorphone for severe pain, scheduled acetaminophen and ibuprofen. Follow with post op recommendations, continue DVT prophylaxis and physical therapy evaluation before discharge.   2. HTN. Continue amiloride, losartan, metoprolol and amlodipine for blood pressure control.   3. Fibromyalgia. Continue pregabalin, duloxetine,   4. Obesity. Calculated BMI is 30, will need outpatient follow up.   DVT  prophylaxis: scd   Code Status:  full Family Communication: no family at the bedside  Disposition Plan/ discharge barriers: pending clinical improvement.  Body mass index is 30.28 kg/m. Malnutrition Type:      Malnutrition Characteristics:      Nutrition Interventions:     RN Pressure Injury Documentation:     Consultants:   Neurosurgery   Procedures:   Lumbar wound wash out  Antimicrobials:       Subjective: Patient has returned from the OR, continue to have significant back pain, 10/10, worse with movement, improved with analgesics, no radiation, no associated symptoms.   Objective: Vitals:   04/03/19 0956 04/03/19 1005 04/03/19 1009 04/03/19 1020  BP:  129/74 125/71 135/75  Pulse: 66 72 70 66  Resp: 10 13 10 13   Temp:    97.7 F (36.5 C)  TempSrc:      SpO2: 100% 97% 97% 98%  Weight:      Height:        Intake/Output Summary (Last 24 hours) at 04/03/2019 1042 Last data filed at 04/03/2019 0852 Gross per 24 hour  Intake 1583.29 ml  Output 1760 ml  Net -176.71 ml   Filed Weights   04/02/19 1139 04/02/19 2037  Weight: 68 kg 68 kg    Examination:   General: deconditioned and in pain Neurology: Awake and alert, non focal  E ENT: mild pallor, no icterus, oral mucosa moist Cardiovascular: No JVD. S1-S2 present, rhythmic, no gallops, rubs, or murmurs. No lower extremity edema. Pulmonary: vesicular breath sounds bilaterally, adequate air movement, no wheezing, rhonchi or rales. Gastrointestinal. Abdomen with no organomegaly, non tender, no rebound or guarding Skin. No rashes Musculoskeletal: no joint deformities     Data Reviewed:  I have personally reviewed following labs and imaging studies  CBC: Recent Labs  Lab 04/02/19 1220  WBC 7.6  NEUTROABS 4.3  HGB 11.3*  HCT 35.7*  MCV 92.0  PLT 694   Basic Metabolic Panel: Recent Labs  Lab 04/02/19 1220  NA 140  K 4.3  CL 104  CO2 29  GLUCOSE 99  BUN 8  CREATININE 0.69  CALCIUM  8.9   GFR: Estimated Creatinine Clearance: 63.5 mL/min (by C-G formula based on SCr of 0.69 mg/dL). Liver Function Tests: No results for input(s): AST, ALT, ALKPHOS, BILITOT, PROT, ALBUMIN in the last 168 hours. No results for input(s): LIPASE, AMYLASE in the last 168 hours. No results for input(s): AMMONIA in the last 168 hours. Coagulation Profile: No results for input(s): INR, PROTIME in the last 168 hours. Cardiac Enzymes: No results for input(s): CKTOTAL, CKMB, CKMBINDEX, TROPONINI in the last 168 hours. BNP (last 3 results) No results for input(s): PROBNP in the last 8760 hours. HbA1C: No results for input(s): HGBA1C in the last 72 hours. CBG: No results for input(s): GLUCAP in the last 168 hours. Lipid Profile: No results for input(s): CHOL, HDL, LDLCALC, TRIG, CHOLHDL, LDLDIRECT in the last 72 hours. Thyroid Function Tests: No results for input(s): TSH, T4TOTAL, FREET4, T3FREE, THYROIDAB in the last 72 hours. Anemia Panel: No results for input(s): VITAMINB12, FOLATE, FERRITIN, TIBC, IRON, RETICCTPCT in the last 72 hours.    Radiology Studies: I have reviewed all of the imaging during this hospital visit personally     Scheduled Meds: . aMILoride  5 mg Oral Daily  . amLODipine  5 mg Oral Daily  . aspirin EC  81 mg Oral Daily  . DULoxetine  30 mg Oral Daily  . HYDROcodone-acetaminophen      . HYDROmorphone      . HYDROmorphone      . loratadine  10 mg Oral Daily  . losartan  100 mg Oral Daily  . methocarbamol      . metoprolol succinate  50 mg Oral QHS  . pantoprazole  40 mg Oral BID  . pregabalin  75 mg Oral TID  . scopolamine      . [START ON 04/04/2019] Vitamin D (Ergocalciferol)  50,000 Units Oral Q M,W,F   Continuous Infusions: . 0.9 % NaCl with KCl 20 mEq / L 75 mL/hr at 04/02/19 2236  . ceFAZolin       LOS: 1 day        Stiles Maxcy Gerome Apley, MD

## 2019-04-03 NOTE — Anesthesia Preprocedure Evaluation (Signed)
Anesthesia Evaluation  Patient identified by MRN, date of birth, ID band Patient awake    Reviewed: Allergy & Precautions, NPO status , Patient's Chart, lab work & pertinent test results  History of Anesthesia Complications (+) PONV  Airway Mallampati: I  TM Distance: >3 FB Neck ROM: Full    Dental   Pulmonary    Pulmonary exam normal        Cardiovascular hypertension, Pt. on medications Normal cardiovascular exam     Neuro/Psych Anxiety    GI/Hepatic GERD  Medicated and Controlled,  Endo/Other    Renal/GU      Musculoskeletal   Abdominal   Peds  Hematology   Anesthesia Other Findings   Reproductive/Obstetrics                             Anesthesia Physical Anesthesia Plan  ASA: II  Anesthesia Plan: General   Post-op Pain Management:    Induction:   PONV Risk Score and Plan: Ondansetron, Midazolam and Treatment may vary due to age or medical condition  Airway Management Planned: Oral ETT  Additional Equipment:   Intra-op Plan:   Post-operative Plan:   Informed Consent: I have reviewed the patients History and Physical, chart, labs and discussed the procedure including the risks, benefits and alternatives for the proposed anesthesia with the patient or authorized representative who has indicated his/her understanding and acceptance.       Plan Discussed with: CRNA and Surgeon  Anesthesia Plan Comments:         Anesthesia Quick Evaluation

## 2019-04-03 NOTE — Progress Notes (Signed)
Neurosurgery Service Progress Note  Subjective: No acute events overnight.    Objective: Vitals:   04/02/19 1149 04/02/19 1854 04/02/19 2037 04/03/19 0502  BP: (!) 143/64 139/72 125/70 108/61  Pulse: 61 68 72 (!) 56  Resp: 14 16 17 17   Temp: 98.4 F (36.9 C) 98.6 F (37 C) 98.6 F (37 C) 98.5 F (36.9 C)  TempSrc: Oral Oral Oral Oral  SpO2: 98% 100% 98% 100%  Weight:   68 kg   Height:       Temp (24hrs), Avg:98.5 F (36.9 C), Min:98.4 F (36.9 C), Max:98.6 F (37 C)  CBC Latest Ref Rng & Units 04/02/2019 03/15/2019 03/09/2019  WBC 4.0 - 10.5 K/uL 7.6 10.9(H) 8.4  Hemoglobin 12.0 - 15.0 g/dL 11.3(L) 10.6(L) 9.1(L)  Hematocrit 36.0 - 46.0 % 35.7(L) 33.8(L) 28.3(L)  Platelets 150 - 400 K/uL 276 485(H) 212   BMP Latest Ref Rng & Units 04/02/2019 03/15/2019 03/13/2019  Glucose 70 - 99 mg/dL 99 87 82  BUN 6 - 20 mg/dL 8 15 19   Creatinine 0.44 - 1.00 mg/dL 0.69 0.81 1.07(H)  Sodium 135 - 145 mmol/L 140 130(L) 137  Potassium 3.5 - 5.1 mmol/L 4.3 4.7 4.7  Chloride 98 - 111 mmol/L 104 98 104  CO2 22 - 32 mmol/L 29 26 26   Calcium 8.9 - 10.3 mg/dL 8.9 8.6(L) 8.7(L)    Intake/Output Summary (Last 24 hours) at 04/03/2019 0601 Last data filed at 04/03/2019 0457 Gross per 24 hour  Intake 0 ml  Output 1250 ml  Net -1250 ml    Current Facility-Administered Medications:  .  0.9 % NaCl with KCl 20 mEq/ L  infusion, , Intravenous, Continuous, Lady Deutscher, MD, Last Rate: 75 mL/hr at 04/02/19 2236 .  albuterol (PROVENTIL) (2.5 MG/3ML) 0.083% nebulizer solution 3 mL, 3 mL, Inhalation, Q6H PRN, Lady Deutscher, MD .  aMILoride [facility]) tablet 5 mg, 5 mg, Oral, Daily, Lady Deutscher, MD, 5 mg at 04/02/19 1948 .  amLODipine (NORVASC) tablet 5 mg, 5 mg, Oral, Daily, Lady Deutscher, MD, 5 mg at 04/02/19 1947 .  aspirin EC tablet 81 mg, 81 mg, Oral, Daily, Lady Deutscher, MD, 81 mg at 04/02/19 1948 .  DULoxetine (CYMBALTA) DR capsule 30 mg, 30 mg, Oral, Daily, Lady Deutscher, MD, 30 mg at 04/02/19 1948 .  fluticasone (FLONASE) 50 MCG/ACT nasal spray 1 spray, 1 spray, Each Nare, Daily PRN, Lady Deutscher, MD .  HYDROcodone-acetaminophen (NORCO) 10-325 MG per tablet 1 tablet, 1 tablet, Oral, Q4H PRN, Lady Deutscher, MD, 1 tablet at 04/03/19 0240 .  loratadine (CLARITIN) tablet 10 mg, 10 mg, Oral, Daily, Lady Deutscher, MD, 10 mg at 04/02/19 1948 .  losartan (COZAAR) tablet 100 mg, 100 mg, Oral, Daily, Lady Deutscher, MD, 100 mg at 04/02/19 1947 .  methocarbamol (ROBAXIN) tablet 500 mg, 500 mg, Oral, Q6H PRN, Lady Deutscher, MD, 500 mg at 04/03/19 0240 .  metoprolol succinate (TOPROL-XL) 24 hr tablet 50 mg, 50 mg, Oral, QHS, Lady Deutscher, MD, 50 mg at 04/02/19 2234 .  morphine 2 MG/ML injection 2 mg, 2 mg, Intravenous, Q2H PRN, Lady Deutscher, MD, 2 mg at 04/03/19 0516 .  morphine 4 MG/ML injection 4 mg, 4 mg, Intravenous, Q2H PRN, Lady Deutscher, MD .  morphine 4 MG/ML injection 6 mg, 6 mg, Intravenous, Q4H PRN, Lady Deutscher, MD .  pantoprazole (PROTONIX) EC tablet 40 mg, 40 mg, Oral, BID, Evangeline Gula, Wyatt Haste,  MD, 40 mg at 04/02/19 2234 .  polyethylene glycol (MIRALAX / GLYCOLAX) packet 17 g, 17 g, Oral, Daily PRN, Lady Deutscher, MD .  pregabalin (LYRICA) capsule 75 mg, 75 mg, Oral, TID, Lady Deutscher, MD, 75 mg at 04/02/19 1947 .  [START ON 04/04/2019] Vitamin D (Ergocalciferol) (DRISDOL) capsule 50,000 Units, 50,000 Units, Oral, Q M,W,F, Evangeline Gula, Wyatt Haste, MD   Physical Exam: AOx3, PERRL, EOMI, FS Strength 5/5 except for L EHL 3/5, SILT except for L L5 & S1 numbness  Assessment & Plan: 58 y.o. woman s/p recent 2 lvl foraminotomies and PLIF. Post-op, pain improved x2wks then developed increasing pain and wound drainage for the past 1-2 weeks. MRI w/wo with subQ enhancing fluid collection.  -OR today for wound washout and revision, will obtain OR Cx prior to ABx. If purulent, will start ABx intra-op  Jodi Bowman  04/03/19 6:01 AM

## 2019-04-03 NOTE — Anesthesia Postprocedure Evaluation (Signed)
Anesthesia Post Note  Patient: [name]  Procedure(s) Performed: LUMBAR WOUND WASHOUT (N/A Spine Lumbar)     Patient location during evaluation: PACU Anesthesia Type: General Level of consciousness: awake and alert Pain management: pain level controlled Vital Signs Assessment: post-procedure vital signs reviewed and stable Respiratory status: spontaneous breathing, nonlabored ventilation, respiratory function stable and patient connected to nasal cannula oxygen Cardiovascular status: blood pressure returned to baseline and stable Postop Assessment: no apparent nausea or vomiting Anesthetic complications: no    Last Vitals:  Vitals:   04/03/19 1009 04/03/19 1020  BP: 125/71 135/75  Pulse: 70 66  Resp: 10 13  Temp:  36.5 C  SpO2: 97% 98%    Last Pain:  Vitals:   04/03/19 1020  TempSrc:   PainSc: 0-No pain                 Faris Coolman DAVID

## 2019-04-04 ENCOUNTER — Encounter (HOSPITAL_COMMUNITY): Payer: Self-pay | Admitting: Neurological Surgery

## 2019-04-04 DIAGNOSIS — G9763 Postprocedural seroma of a nervous system organ or structure following a nervous system procedure: Secondary | ICD-10-CM

## 2019-04-04 LAB — BASIC METABOLIC PANEL
Anion gap: 9 (ref 5–15)
BUN: 11 mg/dL (ref 6–20)
CO2: 29 mmol/L (ref 22–32)
Calcium: 9.1 mg/dL (ref 8.9–10.3)
Chloride: 105 mmol/L (ref 98–111)
Creatinine, Ser: 0.82 mg/dL (ref 0.44–1.00)
GFR calc Af Amer: >60 mL/min (ref >60)
GFR calc non Af Amer: >60 mL/min (ref >60)
Glucose, Bld: 111 mg/dL — ABNORMAL HIGH (ref 70–99)
Potassium: 4.6 mmol/L (ref 3.5–5.1)
Sodium: 143 mmol/L (ref 135–145)

## 2019-04-04 LAB — CBC WITH DIFFERENTIAL/PLATELET
Abs Immature Granulocytes: 0.04 10*3/uL (ref 0.00–0.07)
Basophils Absolute: 0 10*3/uL (ref 0.0–0.1)
Basophils Relative: 0 %
Eosinophils Absolute: 0 10*3/uL (ref 0.0–0.5)
Eosinophils Relative: 0 %
HCT: 32.1 % — ABNORMAL LOW (ref 36.0–46.0)
Hemoglobin: 10.4 g/dL — ABNORMAL LOW (ref 12.0–15.0)
Immature Granulocytes: 0 %
Lymphocytes Relative: 20 %
Lymphs Abs: 2.3 10*3/uL (ref 0.7–4.0)
MCH: 29.8 pg (ref 26.0–34.0)
MCHC: 32.4 g/dL (ref 30.0–36.0)
MCV: 92 fL (ref 80.0–100.0)
Monocytes Absolute: 1 10*3/uL (ref 0.1–1.0)
Monocytes Relative: 8 %
Neutro Abs: 8.2 10*3/uL — ABNORMAL HIGH (ref 1.7–7.7)
Neutrophils Relative %: 72 %
Platelets: 256 10*3/uL (ref 150–400)
RBC: 3.49 MIL/uL — ABNORMAL LOW (ref 3.87–5.11)
RDW: 15.1 % (ref 11.5–15.5)
WBC: 11.5 10*3/uL — ABNORMAL HIGH (ref 4.0–10.5)
nRBC: 0 % (ref 0.0–0.2)

## 2019-04-04 MED ORDER — CHLORHEXIDINE GLUCONATE CLOTH 2 % EX PADS
6.0000 | MEDICATED_PAD | Freq: Every day | CUTANEOUS | Status: DC
  Administered 2019-04-04: 6 via TOPICAL

## 2019-04-04 MED ORDER — MUPIROCIN 2 % EX OINT
1.0000 "application " | TOPICAL_OINTMENT | Freq: Two times a day (BID) | CUTANEOUS | Status: DC
  Administered 2019-04-04 – 2019-04-05 (×3): 1 via NASAL
  Filled 2019-04-04: qty 22

## 2019-04-04 MED ORDER — MORPHINE SULFATE (PF) 2 MG/ML IV SOLN
1.0000 mg | INTRAVENOUS | Status: DC | PRN
  Administered 2019-04-04 – 2019-04-05 (×3): 1 mg via INTRAVENOUS
  Filled 2019-04-04 (×3): qty 1

## 2019-04-04 NOTE — Progress Notes (Signed)
PROGRESS NOTE    Jodi Bowman  WUJ:[id number] DOB: 22-Nov-1960 DOA: 04/02/2019 PCP: Aletha Halim., PA-C    Brief Narrative:  58 year old female who presented with back pain and left leg numbness.  She had a recent, June 5, lateral L4-5/L5-S1 decompressive laminectomy and foraminotomies with posterior lumbar interbody fusion.  It was a complicated by wound dehiscence and local infection.  Started on antibiotic therapy as an outpatient.  She continued to have persistent pain and local wound drainage associated with bilateral lower extremities.  On her initial physical examination blood pressure 143/64, heart rate 61, respirate 14, temperature 98.4, oxygen saturation 98%, her lungs are clear to auscultation bilaterally, heart S1-S2 present rhythmic, the abdomen was soft nontender, no lower extremity edema.  Sodium 140, potassium 4.3, chloride 104, bicarb 29, glucose 99, BUN 8, creatinine 0.69, white count 7.6, hemoglobin 11.3, hematocrit 35.2, platelets 276.  COVID-19 negative.  Lumbar spine MRI showed postoperative changes, 2 post operative fluid collections, in the laminectomy bed and L3-S1.   Patient was admitted to the hospital working diagnosis of severe back pain due to postoperative fluid collections/ lumbar seroma.    Assessment & Plan:   Principal Problem:   Drainage from wound Active Problems:   Essential hypertension   Chronic low back pain   Fibromyalgia   1. Post operative lumbar seromas.  sp lumbar wound washout. No current indication for antibiotic therapy, will continue pain control with morphine IV and hydrocodone PO as needed, scheduled acetaminophen and low dose ibuprofen. Patient will need physical therapy evaluation today, if pain continue to be controlled, will plan for dc in am with home health services.    2. HTN. Patient had episodic hypotension,systolic blood pressure 94 to 104 mmHg, will hold antihypertensive agents, but metoprolol to prevent rebound  tachycardia. Continue blood pressure monitoring.   3. Fibromyalgia. On pregabalin and duloxetine, per home regimen.    4. Obesity. Calculated BMI is 30, physical therapy evaluation.   DVT prophylaxis: scd   Code Status:  full Family Communication: no family at the bedside  Disposition Plan/ discharge barriers: pending physical therapy evaluation, plan for dc in am.  Body mass index is 30.23 kg/m. Malnutrition Type:      Malnutrition Characteristics:      Nutrition Interventions:     RN Pressure Injury Documentation:     Consultants:   Neurosurgery   Procedures:     Antimicrobials:       Subjective: Patient continue to have back pain, worse with movement, has not been ambulating yet, no nausea or vomiting, no chest pain or dyspnea, last night had episode of hypotension.   Objective: Vitals:   04/03/19 2129 04/04/19 0500 04/04/19 0543 04/04/19 0924  BP: (!) 96/56  104/64 (!) 95/50  Pulse: 61  (!) 57 78  Resp: 18  18 18   Temp: 98.3 F (36.8 C)  98.4 F (36.9 C) 98.5 F (36.9 C)  TempSrc:   Oral Oral  SpO2: 99%  99% 100%  Weight: 67.9 kg 67.9 kg    Height:        Intake/Output Summary (Last 24 hours) at 04/04/2019 1024 Last data filed at 04/04/2019 0830 Gross per 24 hour  Intake 600 ml  Output 1765 ml  Net -1165 ml   Filed Weights   04/02/19 2037 04/03/19 2129 04/04/19 0500  Weight: 68 kg 67.9 kg 67.9 kg    Examination:   General: deconditioned and in pain  Neurology: Awake and alert, non focal  E  ENT: mild pallor, no icterus, oral mucosa moist Cardiovascular: No JVD. S1-S2 present, rhythmic, no gallops, rubs, or murmurs. No lower extremity edema. Pulmonary: positive breath sounds bilaterally, adequate air movement, no wheezing, rhonchi or rales. Gastrointestinal. Abdomen with no organomegaly, non tender, no rebound or guarding Skin. No rashes Musculoskeletal: no joint deformities     Data Reviewed: I have personally reviewed  following labs and imaging studies  CBC: Recent Labs  Lab 04/02/19 1220 04/04/19 0534  WBC 7.6 11.5*  NEUTROABS 4.3 8.2*  HGB 11.3* 10.4*  HCT 35.7* 32.1*  MCV 92.0 92.0  PLT 276 846   Basic Metabolic Panel: Recent Labs  Lab 04/02/19 1220 04/04/19 0534  NA 140 143  K 4.3 4.6  CL 104 105  CO2 29 29  GLUCOSE 99 111*  BUN 8 11  CREATININE 0.69 0.82  CALCIUM 8.9 9.1   GFR: Estimated Creatinine Clearance: 61.9 mL/min (by C-G formula based on SCr of 0.82 mg/dL). Liver Function Tests: No results for input(s): AST, ALT, ALKPHOS, BILITOT, PROT, ALBUMIN in the last 168 hours. No results for input(s): LIPASE, AMYLASE in the last 168 hours. No results for input(s): AMMONIA in the last 168 hours. Coagulation Profile: No results for input(s): INR, PROTIME in the last 168 hours. Cardiac Enzymes: No results for input(s): CKTOTAL, CKMB, CKMBINDEX, TROPONINI in the last 168 hours. BNP (last 3 results) No results for input(s): PROBNP in the last 8760 hours. HbA1C: No results for input(s): HGBA1C in the last 72 hours. CBG: No results for input(s): GLUCAP in the last 168 hours. Lipid Profile: No results for input(s): CHOL, HDL, LDLCALC, TRIG, CHOLHDL, LDLDIRECT in the last 72 hours. Thyroid Function Tests: No results for input(s): TSH, T4TOTAL, FREET4, T3FREE, THYROIDAB in the last 72 hours. Anemia Panel: No results for input(s): VITAMINB12, FOLATE, FERRITIN, TIBC, IRON, RETICCTPCT in the last 72 hours.    Radiology Studies: I have reviewed all of the imaging during this hospital visit personally     Scheduled Meds: . acetaminophen  650 mg Oral Q6H  . aMILoride  5 mg Oral Daily  . amLODipine  5 mg Oral Daily  . aspirin EC  81 mg Oral Daily  . DULoxetine  30 mg Oral Daily  . ibuprofen  400 mg Oral TID  . loratadine  10 mg Oral Daily  . losartan  100 mg Oral Daily  . metoprolol succinate  50 mg Oral QHS  . pantoprazole  40 mg Oral BID  . pregabalin  75 mg Oral TID  .  Vitamin D (Ergocalciferol)  50,000 Units Oral Q M,W,F   Continuous Infusions:   LOS: 2 days        Elmira Olkowski Gerome Apley, MD

## 2019-04-04 NOTE — Progress Notes (Signed)
Patient stated that she did not want me to call and update her family. She stated that she has updated everyone and they had no questions. I informed patient that they could call if they had questions or concerns. Patient voiced understanding.

## 2019-04-04 NOTE — Progress Notes (Signed)
Providing Compassionate, Quality Care - Together   Subjective: Patient reports no issues overnight. She feels like her pain is gradually improving.  Objective: Vital signs in last 24 hours: Temp:  [98.3 F (36.8 C)-98.5 F (36.9 C)] 98.5 F (36.9 C) (07/06 0924) Pulse Rate:  [57-78] 78 (07/06 0924) Resp:  [18] 18 (07/06 0924) BP: (95-104)/(50-64) 95/50 (07/06 0924) SpO2:  [99 %-100 %] 100 % (07/06 0924) Weight:  [67.9 kg] 67.9 kg (07/06 0500)  Intake/Output from previous day: 07/05 0701 - 07/06 0700 In: 1160 [P.O.:360; I.V.:800] Out: 1224 [Urine:1665; Blood:10] Intake/Output this shift: Total I/O In: 480 [P.O.:480] Out: 400 [Urine:400]  Alert and oriented x 4 PERRLA MAE 5/5 BUE, RLE 4/5 LLE Decreased sensation LLE Incision with old drainage at the bottom; Honeycomb dressing in place    Lab Results: Recent Labs    04/02/19 1220 04/04/19 0534  WBC 7.6 11.5*  HGB 11.3* 10.4*  HCT 35.7* 32.1*  PLT 276 256   BMET Recent Labs    04/02/19 1220 04/04/19 0534  NA 140 143  K 4.3 4.6  CL 104 105  CO2 29 29  GLUCOSE 99 111*  BUN 8 11  CREATININE 0.69 0.82  CALCIUM 8.9 9.1    Studies/Results: Mr Lumbar Spine W Wo Contrast  Result Date: 04/02/2019 CLINICAL DATA:  Back pain and left leg numbness since surgery 03/04/2019. Incision site has been draining. EXAM: MRI LUMBAR SPINE WITHOUT AND WITH CONTRAST TECHNIQUE: Multiplanar and multiecho pulse sequences of the lumbar spine were obtained without and with intravenous contrast. CONTRAST:  6 cc Gadavist COMPARISON:  MRI 11/16/2018 FINDINGS: Segmentation: There are five lumbar type vertebral bodies. The last full intervertebral disc space is labeled L5-S1. This correlates with the prior MRI examination. Alignment:  Normal Vertebrae: Since the prior MRI there have been surgical changes with pedicle screws, posterior rods and interbody fusion devices at L4-5 and L5-S1. Moderate associated artifact with the hardware. No  complicating features. Conus medullaris and cauda equina: Conus extends to the L1 level. Conus and cauda equina appear normal. Paraspinal and other soft tissues: No significant paraspinal or retroperitoneal findings. There is a moderate-sized fluid collection in the laminectomy defect measuring approximately 5.5 x 4.0 x 3.0 cm. No obvious connection with the thecal sac is demonstrated. There is a large rim enhancing fluid collection in the subcutaneous fat extending from L3-S1. This measures 9.0 x 6.0 x 3.8 cm. It could be a liquified hematoma but an abscess is certainly a consideration. I do not see a definite/obvious connection between these 2 fluid collections. Disc levels: L1-2: No significant findings. L2-3: No significant findings. L3-4: Mild facet disease. No disc protrusions, spinal or foraminal stenosis. L4-5: Surgical changes with wide decompressive laminectomy. Posterior and interbody fusion hardware noted. No obvious spinal or foraminal stenosis. Large postoperative fluid collection in the laminectomy bed. CSF leak is certainly a possibility. L5-S1: Wide decompressive laminectomy with posterior and interbody fusion changes. Large fluid collection in the laminectomy bed and in the subcutaneous tissues. IMPRESSION: 1. Postoperative changes with posterior and interbody fusion at L4-5 and L5-S1. Wide decompressive laminectomies without findings for spinal or foraminal stenosis. 2. The other intervertebral disc spaces are unremarkable. I do not see a definite cause for the patient's left leg pain. 3. Two sizable postoperative fluid collections as detailed above. 5 cm collection in the laminectomy bed from the top of L4 to the top of S1. It could be a benign postop fluid collection but could not exclude abscess or  CSF leak. I do not see a definite connection with the larger, 9 cm, rim enhancing subcutaneous lesion. That lesion could be an abscess. Aspiration may be helpful. Electronically Signed   By: Marijo Sanes M.D.   On: 04/02/2019 15:35    Assessment/Plan: Ms. Parlin underwent an L4-5, L5-S1 decompression and fusion on 03/04/2019. She developed increased pain and wound drainage. Was started on Keflex on 03/29/2019. Pain worsened and she went to ED. Underwent an I and D on 04/03/2019 by Dr. Zada Finders. Serous fluid was drained at the time consistent with seroma.   LOS: 2 days    -Patient to work with PT, previously getting home health PT -OK to discharge when medically ready   Viona Gilmore, DNP, AGNP-C Nurse Practitioner  [facility] [place]. [address], [place], [place] [address] P: [phone]    F: [phone]  04/04/2019, 12:43 PM

## 2019-04-05 DIAGNOSIS — G9763 Postprocedural seroma of a nervous system organ or structure following a nervous system procedure: Secondary | ICD-10-CM

## 2019-04-05 MED ORDER — IBUPROFEN 400 MG PO TABS: 400.0000 mg | ORAL_TABLET | Freq: Three times a day (TID) | ORAL | 0 refills | Status: DC | PRN

## 2019-04-05 NOTE — Plan of Care (Signed)

## 2019-04-05 NOTE — Discharge Summary (Signed)
Physician Discharge Summary  Jodi Bowman ZSW:[id number] DOB: [birth year] DOA: 04/02/2019  PCP: Aletha Halim., PA-C  Admit date: 04/02/2019 Discharge date: 04/05/2019  Admitted From: Home  Disposition:  Home   Recommendations for Outpatient Follow-up and new medication changes:  1. Follow up with Bing Matter PA-C in 7 days.  2. Continue pain control with acetaminophen, ibuprofen and hydrocodone. 3. GI prophylaxis with pantoprazole.    Home Health: yes   Equipment/Devices: walker    Discharge Condition: stable  CODE STATUS: full  Diet recommendation: heart healthy   Brief/Interim Summary: 58 year old female who presented with back pain and left leg numbness.She had a recent, June 5, lateral L4-5/L5-S1 decompressive laminectomy and foraminotomies withposterior lumbar interbody fusion. It was a complicated by wound dehiscence and local infection. Started on antibiotic therapy as an outpatient. She continued to have persistent pain and local wound drainage associated with bilateral lower extremities. On her initial physical examination blood pressure 143/64, heart rate 61, respiratory rate 14, temperature 98.4, oxygen saturation 98%, her lungs were clear to auscultation bilaterally, heart S1-S2 present rhythmic, the abdomen was soft nontender, no lower extremity edema.Sodium 140, potassium 4.3, chloride 104, bicarb 29, glucose 99, BUN 8, creatinine 0.69, white count 7.6, hemoglobin 11.3, hematocrit 35.2, platelets 276. COVID-19 negative.Lumbar spine MRI showed postoperative changes, 2 post operative fluid collections, in the laminectomy bed and L3-S1.  Patient was admitted to the hospital working diagnosis of severe back pain due to postoperative fluid collections.  1. Postoperative lumbar seromas, status post wound washout. Patient was admitted to the medical ward, she received analgesics with good toleration.  She underwent lumbar wound washout, with postoperative  diagnosis of lumbar seroma.  No signs of local infection, patient did not receive antibiotic therapy.  She was seen by physical therapy, recommendations to continue therapy at home.  Wiill add as needed ibuprofen for pain control.  2.  Hypertension.  Patient had episodic hypotension, her antihypertensive agents were held during her hospitalization, except metoprolol.  At discharge patient will resume her antihypertensive agents, with amiloride, amlodipine, losartan, met  3.  Fibromyalgia.  Patient will continue pregabalin and duloxetine.  4.  Obesity.  Her calculated BMI is 30.   Discharge Diagnoses:  Principal Problem:   Postoperative seroma involving nervous system after nervous system procedure Active Problems:   Essential hypertension   Chronic low back pain   Fibromyalgia    Discharge Instructions   Allergies as of 04/05/2019      Reactions   Shrimp [shellfish Allergy] Anaphylaxis   Naproxen Other (See Comments)   Makes stomach cramp and burn badly      Medication List    STOP taking these medications   bisacodyl 10 MG suppository Commonly known as: DULCOLAX     TAKE these medications   acetaminophen 325 MG tablet Commonly known as: TYLENOL Take 2 tablets (650 mg total) by mouth every 4 (four) hours as needed for mild pain ((score 1 to 3) or temp > 100.5).   aMILoride 5 MG tablet Commonly known as: MIDAMOR Take 1 tablet (5 mg total) by mouth daily.   amLODipine 5 MG tablet Commonly known as: NORVASC Take 1 tablet (5 mg total) by mouth daily.   aspirin EC 81 MG tablet Take 81 mg by mouth daily.   DULoxetine 30 MG capsule Commonly known as: CYMBALTA Take 1 capsule (30 mg total) by mouth daily.   fluticasone 50 MCG/ACT nasal spray Commonly known as: FLONASE Place 1 spray into both nostrils daily  as needed for allergies.   folic acid 1 MG tablet Commonly known as: FOLVITE Take 1 mg by mouth daily.   HYDROcodone-acetaminophen 10-325 MG tablet Commonly  known as: NORCO Take 1 tablet by mouth every 4 (four) hours as needed for moderate pain ((score 4 to 6)).   ibuprofen 400 MG tablet Commonly known as: ADVIL Take 1 tablet (400 mg total) by mouth every 8 (eight) hours as needed for moderate pain.   loratadine 10 MG tablet Commonly known as: CLARITIN Take 10 mg by mouth daily.   losartan 100 MG tablet Commonly known as: COZAAR Take 1 tablet (100 mg total) by mouth daily.   methocarbamol 500 MG tablet Commonly known as: ROBAXIN Take 1 tablet (500 mg total) by mouth every 6 (six) hours as needed for muscle spasms.   metoprolol succinate 50 MG 24 hr tablet Commonly known as: TOPROL-XL Take 1 tablet (50 mg total) by mouth daily. Take with or immediately following a meal. What changed: when to take this   nitroGLYCERIN 0.4 MG SL tablet Commonly known as: NITROSTAT Place 1 tablet (0.4 mg total) under the tongue every 5 (five) minutes x 3 doses as needed for chest pain. What changed:   when to take this  reasons to take this   pantoprazole 40 MG tablet Commonly known as: PROTONIX Take 1 tablet (40 mg total) by mouth 2 (two) times daily.   polyethylene glycol 17 g packet Commonly known as: MIRALAX / GLYCOLAX Take 17 g by mouth daily as needed for mild constipation.   pregabalin 75 MG capsule Commonly known as: Lyrica Take 1 capsule (75 mg total) by mouth 3 (three) times daily.   Proventil HFA 108 (90 Base) MCG/ACT inhaler Generic drug: albuterol Inhale 1-2 puffs into the lungs every 6 (six) hours as needed for wheezing or shortness of breath.   Vitamin D (Ergocalciferol) 1.25 MG (50000 UT) Caps capsule Commonly known as: DRISDOL Take 1 capsule (50,000 Units total) by mouth every Monday, Wednesday, and Friday.       Allergies  Allergen Reactions  . Shrimp [Shellfish Allergy] Anaphylaxis  . Naproxen Other (See Comments)    Makes stomach cramp and burn badly    Consultations:  Neurosurgery     Procedures/Studies: Mr Lumbar Spine W Wo Contrast  Result Date: 04/02/2019 CLINICAL DATA:  Back pain and left leg numbness since surgery 03/04/2019. Incision site has been draining. EXAM: MRI LUMBAR SPINE WITHOUT AND WITH CONTRAST TECHNIQUE: Multiplanar and multiecho pulse sequences of the lumbar spine were obtained without and with intravenous contrast. CONTRAST:  6 cc Gadavist COMPARISON:  MRI 11/16/2018 FINDINGS: Segmentation: There are five lumbar type vertebral bodies. The last full intervertebral disc space is labeled L5-S1. This correlates with the prior MRI examination. Alignment:  Normal Vertebrae: Since the prior MRI there have been surgical changes with pedicle screws, posterior rods and interbody fusion devices at L4-5 and L5-S1. Moderate associated artifact with the hardware. No complicating features. Conus medullaris and cauda equina: Conus extends to the L1 level. Conus and cauda equina appear normal. Paraspinal and other soft tissues: No significant paraspinal or retroperitoneal findings. There is a moderate-sized fluid collection in the laminectomy defect measuring approximately 5.5 x 4.0 x 3.0 cm. No obvious connection with the thecal sac is demonstrated. There is a large rim enhancing fluid collection in the subcutaneous fat extending from L3-S1. This measures 9.0 x 6.0 x 3.8 cm. It could be a liquified hematoma but an abscess is certainly a consideration. I  do not see a definite/obvious connection between these 2 fluid collections. Disc levels: L1-2: No significant findings. L2-3: No significant findings. L3-4: Mild facet disease. No disc protrusions, spinal or foraminal stenosis. L4-5: Surgical changes with wide decompressive laminectomy. Posterior and interbody fusion hardware noted. No obvious spinal or foraminal stenosis. Large postoperative fluid collection in the laminectomy bed. CSF leak is certainly a possibility. L5-S1: Wide decompressive laminectomy with posterior and interbody  fusion changes. Large fluid collection in the laminectomy bed and in the subcutaneous tissues. IMPRESSION: 1. Postoperative changes with posterior and interbody fusion at L4-5 and L5-S1. Wide decompressive laminectomies without findings for spinal or foraminal stenosis. 2. The other intervertebral disc spaces are unremarkable. I do not see a definite cause for the patient's left leg pain. 3. Two sizable postoperative fluid collections as detailed above. 5 cm collection in the laminectomy bed from the top of L4 to the top of S1. It could be a benign postop fluid collection but could not exclude abscess or CSF leak. I do not see a definite connection with the larger, 9 cm, rim enhancing subcutaneous lesion. That lesion could be an abscess. Aspiration may be helpful. Electronically Signed   By: Marijo Sanes M.D.   On: 04/02/2019 15:35   Vas [place] Lower Extremity Venous (dvt)  Result Date: 03/09/2019  Lower Venous Study Indications: Swelling.  Performing Technologist: Maudry Mayhew MHA, RDMS, RVT, RDCS  Examination Guidelines: A complete evaluation includes B-mode imaging, spectral Doppler, color Doppler, and power Doppler as needed of all accessible portions of each vessel. Bilateral testing is considered an integral part of a complete examination. Limited examinations for reoccurring indications may be performed as noted.  +---------+---------------+---------+-----------+----------+-------+ RIGHT    CompressibilityPhasicitySpontaneityPropertiesSummary +---------+---------------+---------+-----------+----------+-------+ CFV      Full           Yes      Yes                          +---------+---------------+---------+-----------+----------+-------+ SFJ      Full                                                 +---------+---------------+---------+-----------+----------+-------+ FV Prox  Full                                                  +---------+---------------+---------+-----------+----------+-------+ FV Mid   Full                                                 +---------+---------------+---------+-----------+----------+-------+ FV DistalFull                                                 +---------+---------------+---------+-----------+----------+-------+ PFV      Full                                                 +---------+---------------+---------+-----------+----------+-------+  POP      Full           Yes      Yes                          +---------+---------------+---------+-----------+----------+-------+ PTV      Full                                                 +---------+---------------+---------+-----------+----------+-------+ PERO     Full                                                 +---------+---------------+---------+-----------+----------+-------+   +---------+---------------+---------+-----------+----------+-------+ LEFT     CompressibilityPhasicitySpontaneityPropertiesSummary +---------+---------------+---------+-----------+----------+-------+ CFV      Full           Yes      Yes                          +---------+---------------+---------+-----------+----------+-------+ SFJ      Full                                                 +---------+---------------+---------+-----------+----------+-------+ FV Prox  Full                                                 +---------+---------------+---------+-----------+----------+-------+ FV Mid   Full                                                 +---------+---------------+---------+-----------+----------+-------+ FV DistalFull                                                 +---------+---------------+---------+-----------+----------+-------+ PFV      Full                                                 +---------+---------------+---------+-----------+----------+-------+ POP      Full            Yes      Yes                          +---------+---------------+---------+-----------+----------+-------+ PTV      Full                                                 +---------+---------------+---------+-----------+----------+-------+ PERO  Full                                                 +---------+---------------+---------+-----------+----------+-------+     Summary: Right: There is no evidence of deep vein thrombosis in the lower extremity. No cystic structure found in the popliteal fossa. Left: There is no evidence of deep vein thrombosis in the lower extremity. No cystic structure found in the popliteal fossa.  *See table(s) above for measurements and observations. Electronically signed by Curt Jews MD on 03/09/2019 at 4:58:13 PM.    Final       Procedures: lumbar spine wound washout.   Subjective: Patient is feeling better, back pain continue to improve, no nausea or vomiting, no chest pain.   Discharge Exam: Vitals:   04/05/19 0506 04/05/19 0902  BP: (!) 104/53 93/60  Pulse: (!) 55 70  Resp: 18 18  Temp: 98.6 F (37 C) 97.7 F (36.5 C)  SpO2: 100% 99%   Vitals:   04/04/19 2042 04/05/19 0454 04/05/19 0506 04/05/19 0902  BP: (!) 120/59  (!) 104/53 93/60  Pulse: 65  (!) 55 70  Resp: '18  18 18  ' Temp: 98.5 F (36.9 C)  98.6 F (37 C) 97.7 F (36.5 C)  TempSrc: Oral  Oral Oral  SpO2: 95%  100% 99%  Weight: 67.8 kg 67.8 kg    Height:        General: Not in pain or dyspnea Neurology: Awake and alert, non focal  E ENT: no pallor, no icterus, oral mucosa moist Cardiovascular: No JVD. S1-S2 present, rhythmic, no gallops, rubs, or murmurs. No lower extremity edema. Pulmonary: positive breath sounds bilaterally, adequate air movement, no wheezing, rhonchi or rales. Gastrointestinal. Abdomen with, no organomegaly, non tender, no rebound or guarding Skin. No rashes Musculoskeletal: no joint deformities   The results of significant  diagnostics from this hospitalization (including imaging, microbiology, ancillary and laboratory) are listed below for reference.     Microbiology: Recent Results (from the past 240 hour(s))  SARS Coronavirus 2 (CEPHEID - Performed in [place] hospital lab), Hosp Order     Status: None   Collection Time: 04/02/19  4:46 PM   Specimen: Nasopharyngeal Swab  Result Value Ref Range Status   SARS Coronavirus 2 NEGATIVE NEGATIVE Final    Comment: (NOTE) If result is NEGATIVE SARS-CoV-2 target nucleic acids are NOT DETECTED. The SARS-CoV-2 RNA is generally detectable in upper and lower  respiratory specimens during the acute phase of infection. The lowest  concentration of SARS-CoV-2 viral copies this assay can detect is 250  copies / mL. A negative result does not preclude SARS-CoV-2 infection  and should not be used as the sole basis for treatment or other  patient management decisions.  A negative result may occur with  improper specimen collection / handling, submission of specimen other  than nasopharyngeal swab, presence of viral mutation(s) within the  areas targeted by this assay, and inadequate number of viral copies  (<250 copies / mL). A negative result must be combined with clinical  observations, patient history, and epidemiological information. If result is POSITIVE SARS-CoV-2 target nucleic acids are DETECTED. The SARS-CoV-2 RNA is generally detectable in upper and lower  respiratory specimens dur ing the acute phase of infection.  Positive  results are indicative of active infection with SARS-CoV-2.  Clinical  correlation with  patient history and other diagnostic information is  necessary to determine patient infection status.  Positive results do  not rule out bacterial infection or co-infection with other viruses. If result is PRESUMPTIVE POSTIVE SARS-CoV-2 nucleic acids MAY BE PRESENT.   A presumptive positive result was obtained on the submitted specimen  and  confirmed on repeat testing.  While 2019 novel coronavirus  (SARS-CoV-2) nucleic acids may be present in the submitted sample  additional confirmatory testing may be necessary for epidemiological  and / or clinical management purposes  to differentiate between  SARS-CoV-2 and other Sarbecovirus currently known to infect humans.  If clinically indicated additional testing with an alternate test  methodology [phone]) is advised. The SARS-CoV-2 RNA is generally  detectable in upper and lower respiratory sp ecimens during the acute  phase of infection. The expected result is Negative. Fact Sheet for Patients:  StrictlyIdeas.no Fact Sheet for Healthcare Providers: BankingDealers.co.za This test is not yet approved or cleared by the [place] FDA and has been authorized for detection and/or diagnosis of SARS-CoV-2 by FDA under an Emergency Use Authorization (EUA).  This EUA will remain in effect (meaning this test can be used) for the duration of the COVID-19 declaration under Section 564(b)(1) of the Act, 21 U.S.C. section 360bbb-3(b)(1), unless the authorization is terminated or revoked sooner. Performed at [facility], [place] [address], [place], [place] [address]   Surgical pcr screen     Status: Abnormal   Collection Time: 04/03/19  5:47 AM   Specimen: Nasal Mucosa; Nasal Swab  Result Value Ref Range Status   MRSA, PCR NEGATIVE NEGATIVE Final   Staphylococcus aureus POSITIVE (A) NEGATIVE Final    Comment: (NOTE) The Xpert SA Assay (FDA approved for NASAL specimens in patients 7 years of age and older), is one component of a comprehensive surveillance program. It is not intended to diagnose infection nor to guide or monitor treatment. Performed at [facility], [place] [address] [place], [place] [address]      Labs: BNP (last 3 results) No results for input(s): BNP in the last 8760 hours. Basic Metabolic  Panel: Recent Labs  Lab 04/02/19 1220 04/04/19 0534  NA 140 143  K 4.3 4.6  CL 104 105  CO2 29 29  GLUCOSE 99 111*  BUN 8 11  CREATININE 0.69 0.82  CALCIUM 8.9 9.1   Liver Function Tests: No results for input(s): AST, ALT, ALKPHOS, BILITOT, PROT, ALBUMIN in the last 168 hours. No results for input(s): LIPASE, AMYLASE in the last 168 hours. No results for input(s): AMMONIA in the last 168 hours. CBC: Recent Labs  Lab 04/02/19 1220 04/04/19 0534  WBC 7.6 11.5*  NEUTROABS 4.3 8.2*  HGB 11.3* 10.4*  HCT 35.7* 32.1*  MCV 92.0 92.0  PLT 276 256   Cardiac Enzymes: No results for input(s): CKTOTAL, CKMB, CKMBINDEX, TROPONINI in the last 168 hours. BNP: Invalid input(s): POCBNP CBG: No results for input(s): GLUCAP in the last 168 hours. D-Dimer No results for input(s): DDIMER in the last 72 hours. Hgb A1c No results for input(s): HGBA1C in the last 72 hours. Lipid Profile No results for input(s): CHOL, HDL, LDLCALC, TRIG, CHOLHDL, LDLDIRECT in the last 72 hours. Thyroid function studies No results for input(s): TSH, T4TOTAL, T3FREE, THYROIDAB in the last 72 hours.  Invalid input(s): FREET3 Anemia work up No results for input(s): VITAMINB12, FOLATE, FERRITIN, TIBC, IRON, RETICCTPCT in the last 72 hours. Urinalysis    Component Value Date/Time  COLORURINE YELLOW 03/06/2018 1251   APPEARANCEUR HAZY (A) 03/06/2018 1251   LABSPEC 1.018 03/06/2018 1251   PHURINE 6.0 03/06/2018 1251   GLUCOSEU NEGATIVE 03/06/2018 1251   HGBUR NEGATIVE 03/06/2018 1251   HGBUR negative 03/01/2009 0853   BILIRUBINUR NEGATIVE 03/06/2018 1251   KETONESUR NEGATIVE 03/06/2018 1251   PROTEINUR NEGATIVE 03/06/2018 1251   UROBILINOGEN 1.0 10/09/2014 2018   NITRITE NEGATIVE 03/06/2018 1251   LEUKOCYTESUR NEGATIVE 03/06/2018 1251   Sepsis Labs Invalid input(s): PROCALCITONIN,  WBC,  LACTICIDVEN Microbiology Recent Results (from the past 240 hour(s))  SARS Coronavirus 2 (CEPHEID - Performed  in [place] hospital lab), Hosp Order     Status: None   Collection Time: 04/02/19  4:46 PM   Specimen: Nasopharyngeal Swab  Result Value Ref Range Status   SARS Coronavirus 2 NEGATIVE NEGATIVE Final    Comment: (NOTE) If result is NEGATIVE SARS-CoV-2 target nucleic acids are NOT DETECTED. The SARS-CoV-2 RNA is generally detectable in upper and lower  respiratory specimens during the acute phase of infection. The lowest  concentration of SARS-CoV-2 viral copies this assay can detect is 250  copies / mL. A negative result does not preclude SARS-CoV-2 infection  and should not be used as the sole basis for treatment or other  patient management decisions.  A negative result may occur with  improper specimen collection / handling, submission of specimen other  than nasopharyngeal swab, presence of viral mutation(s) within the  areas targeted by this assay, and inadequate number of viral copies  (<250 copies / mL). A negative result must be combined with clinical  observations, patient history, and epidemiological information. If result is POSITIVE SARS-CoV-2 target nucleic acids are DETECTED. The SARS-CoV-2 RNA is generally detectable in upper and lower  respiratory specimens dur ing the acute phase of infection.  Positive  results are indicative of active infection with SARS-CoV-2.  Clinical  correlation with patient history and other diagnostic information is  necessary to determine patient infection status.  Positive results do  not rule out bacterial infection or co-infection with other viruses. If result is PRESUMPTIVE POSTIVE SARS-CoV-2 nucleic acids MAY BE PRESENT.   A presumptive positive result was obtained on the submitted specimen  and confirmed on repeat testing.  While 2019 novel coronavirus  (SARS-CoV-2) nucleic acids may be present in the submitted sample  additional confirmatory testing may be necessary for epidemiological  and / or clinical management purposes  to  differentiate between  SARS-CoV-2 and other Sarbecovirus currently known to infect humans.  If clinically indicated additional testing with an alternate test  methodology [phone]) is advised. The SARS-CoV-2 RNA is generally  detectable in upper and lower respiratory sp ecimens during the acute  phase of infection. The expected result is Negative. Fact Sheet for Patients:  StrictlyIdeas.no Fact Sheet for Healthcare Providers: BankingDealers.co.za This test is not yet approved or cleared by the [place] FDA and has been authorized for detection and/or diagnosis of SARS-CoV-2 by FDA under an Emergency Use Authorization (EUA).  This EUA will remain in effect (meaning this test can be used) for the duration of the COVID-19 declaration under Section 564(b)(1) of the Act, 21 U.S.C. section 360bbb-3(b)(1), unless the authorization is terminated or revoked sooner. Performed at [facility], [place] [address] [place], [place] [address]   Surgical pcr screen     Status: Abnormal   Collection Time: 04/03/19  5:47 AM   Specimen: Nasal Mucosa; Nasal Swab  Result Value Ref Range Status  MRSA, PCR NEGATIVE NEGATIVE Final   Staphylococcus aureus POSITIVE (A) NEGATIVE Final    Comment: (NOTE) The Xpert SA Assay (FDA approved for NASAL specimens in patients 14 years of age and older), is one component of a comprehensive surveillance program. It is not intended to diagnose infection nor to guide or monitor treatment. Performed at [facility], [place] [address] [place], [place] [address]      Time coordinating discharge: 45 minutes  SIGNED:   Tawni Millers, MD  [facility] 04/05/2019, 9:32 AM

## 2019-04-05 NOTE — Progress Notes (Signed)
DISCHARGE NOTE HOME Jodi Bowman to be discharged Home per MD order. Discussed prescriptions and follow up appointments with the patient. Prescriptions given to patient; medication list explained in detail. Patient verbalized understanding.  Skin clean, dry and intact without evidence of skin break down, no evidence of skin tears noted. IV catheter discontinued intact. Site without signs and symptoms of complications. Dressing and pressure applied. Pt denies pain at the site currently. No complaints noted.  Patient free of lines, drains, and wounds.   An After Visit Summary (AVS) was printed and given to the patient. Patient escorted via wheelchair, and discharged home via private auto.  Aneta Mins BSN, RN3

## 2019-04-05 NOTE — Evaluation (Signed)
Physical Therapy Evaluation Patient Details Name: Jodi Bowman MRN: [id number] DOB: 1961/02/09 Today's Date: 04/05/2019   History of Present Illness  Patient is a 58 y/o female s/p Lumbar wound washout and revision on 04/03/2019. 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     Clinical Impression  Patient admitted with the above listed diagnosis. Patient reports Mod I with RW at baseline following most recent surgery. Patient today performing all functional mobility at general min guard level for safety. Noted L LE buckle with gait - able to compensate with B UE at RW. Will recommend continuation of HHPT services at discharge. PT to continue to follow.     Follow Up Recommendations Home health PT;Supervision - Intermittent    Equipment Recommendations  None recommended by PT    Recommendations for Other Services       Precautions / Restrictions Precautions Precautions: Back;Fall Precaution Comments: reviewed back precautions as she has not been told to discontinue; no brace in room Restrictions Weight Bearing Restrictions: No      Mobility  Bed Mobility Overal bed mobility: Modified Independent                Transfers Overall transfer level: Needs assistance Equipment used: Rolling walker (2 wheeled) Transfers: Sit to/from [facility] Sit to Stand: Min guard Stand pivot transfers: Min guard       General transfer comment: for safety - no LOB  Ambulation/Gait Ambulation/Gait assistance: Min guard Gait Distance (Feet): 150 Feet Assistive device: Rolling walker (2 wheeled) Gait Pattern/deviations: Step-through pattern;Decreased stride length;Decreased weight shift to left Gait velocity: decreased   General Gait Details: mild buckle of L  LE with each step - required use of RW for UE support  Stairs            Wheelchair Mobility    Modified Rankin (Stroke Patients Only)       Balance Overall balance assessment: Mild deficits observed, not formally tested                                           Pertinent Vitals/Pain Pain Assessment: No/denies pain    Home Living Family/patient expects to be discharged to:: Private residence Living Arrangements: Spouse/significant other Available Help at Discharge: Family Type of Home: House Home Access: Stairs to enter Entrance Stairs-Rails: Left Entrance Stairs-Number of Steps: 4, ramped entry at front of home Home Layout: One level Home Equipment: Cane - single point;Walker - 2 wheels;Bedside commode      Prior Function Level of Independence: Independent with assistive device(s)         Comments: has been using RW for mobility     Hand Dominance   Dominant Hand: Right    Extremity/Trunk Assessment   Upper Extremity Assessment Upper Extremity Assessment: Defer to OT evaluation    Lower Extremity Assessment Lower Extremity Assessment: Generalized weakness;LLE deficits/detail LLE Deficits / Details: mild buckle with each step in weight bearing    Cervical / Trunk Assessment Cervical / Trunk Assessment: Normal  Communication   Communication: No difficulties  Cognition Arousal/Alertness: Awake/alert Behavior During Therapy: WFL for tasks assessed/performed Overall Cognitive Status: Within Functional Limits for tasks assessed  General Comments      Exercises     Assessment/Plan    PT Assessment Patient needs continued PT services  PT Problem List Decreased strength;Decreased activity tolerance;Decreased balance;Decreased mobility;Decreased knowledge of use of DME;Decreased safety awareness       PT Treatment Interventions DME instruction;Gait training;Stair  training;Functional mobility training;Therapeutic activities;Therapeutic exercise;Balance training    PT Goals (Current goals can be found in the Care Plan section)  Acute Rehab PT Goals Patient Stated Goal: return home soon PT Goal Formulation: With patient Time For Goal Achievement: 04/19/19 Potential to Achieve Goals: Good    Frequency Min 3X/week   Barriers to discharge        Co-evaluation               AM-PAC PT "6 Clicks" Mobility  Outcome Measure Help needed turning from your back to your side while in a flat bed without using bedrails?: None Help needed moving from lying on your back to sitting on the side of a flat bed without using bedrails?: None Help needed moving to and from a bed to a chair (including a wheelchair)?: A Little Help needed standing up from a chair using your arms (e.g., wheelchair or bedside chair)?: A Little Help needed to walk in hospital room?: A Little Help needed climbing 3-5 steps with a railing? : A Lot 6 Click Score: 19    End of Session Equipment Utilized During Treatment: Gait belt Activity Tolerance: Patient tolerated treatment well Patient left: in chair;with call bell/phone within reach Nurse Communication: Mobility status PT Visit Diagnosis: Unsteadiness on feet (R26.81);Other abnormalities of gait and mobility (R26.89);Muscle weakness (generalized) (M62.81)    Time: 0940-7680 PT Time Calculation (min) (ACUTE ONLY): 19 min   Charges:   PT Evaluation $PT Eval Moderate Complexity: 1 Mod          Lanney Gins, PT, DPT Supplemental Physical Therapist 04/05/19 10:40 AM Pager: [phone] Office: [phone]

## 2019-04-05 NOTE — TOC Transition Note (Signed)
Transition of Care [facility]) - CM/SW Discharge Note   Patient Details  Name: Jodi Bowman MRN: [id number] Date of Birth: 1961-02-19  [facility] CM/SW Contact:  Bartholomew Crews, RN Phone Number: [phone] 04/05/2019, 10:41 AM   Clinical Narrative:    Spoke with patient at bedside. PTA home with spouse. States her sister will pick her to transition home today. HH orders received for RN, PT, OT, and Aide - active with [facility]. Notified liaison of transition home today along with addition of OT. No issues with obtaining medications. Patient states that she feels good about returning home. No further transition of care needs identified.    Final next level of care: Home w Home Health Services Barriers to Discharge: No Barriers Identified   Patient Goals and CMS Choice   CMS Medicare.gov Compare Post Acute Care list provided to:: Patient Choice offered to / list presented to : Patient  Discharge Placement    Home                   Discharge Plan and Services                DME Arranged: N/A DME Agency: NA       HH Arranged: RN, PT, OT, Nurse's Aide [place] Agency: [place] ([place]) Date HH Agency Contacted: 04/05/19 Time [place]: 1032 Representative spoke with at [place]: [place] ([place]) Interventions     Readmission Risk Interventions Readmission Risk Prevention Plan 03/08/2019  Transportation Screening Complete  PCP or Specialist Appt within 5-7 Days Complete  Home Care Screening Complete  Medication Review (RN CM) Complete  Some recent data might be hidden

## 2019-04-07 ENCOUNTER — Telehealth: Payer: Self-pay

## 2019-04-07 NOTE — Telephone Encounter (Signed)
Erlene Quan OT [facility] [phone] called requesting verbal orders for OT for 2xwk X 4wks.  In addition Merry Proud PT [place] called requesting verbal orders for 2xwk X 2wks followed by 1xwk every other week.  In addition, a home health aid has been requested as well for 2xwk X 2wks.   Called them both back and approved verbal orders.

## 2019-04-08 ENCOUNTER — Telehealth: Payer: Self-pay

## 2019-04-08 NOTE — Telephone Encounter (Signed)
Kasey RN [facility] called requesting verbal orders for skilled nursing for this patient for 1xwk for every other week.  Called her back and approved verbal orders.

## 2019-04-13 DIAGNOSIS — E785 Hyperlipidemia, unspecified: Secondary | ICD-10-CM

## 2019-04-13 DIAGNOSIS — Z8673 Personal history of transient ischemic attack (TIA), and cerebral infarction without residual deficits: Secondary | ICD-10-CM

## 2019-04-13 DIAGNOSIS — I10 Essential (primary) hypertension: Secondary | ICD-10-CM | POA: Diagnosis not present

## 2019-04-13 DIAGNOSIS — Z7982 Long term (current) use of aspirin: Secondary | ICD-10-CM

## 2019-04-13 DIAGNOSIS — E669 Obesity, unspecified: Secondary | ICD-10-CM

## 2019-04-13 DIAGNOSIS — Z4789 Encounter for other orthopedic aftercare: Secondary | ICD-10-CM | POA: Diagnosis not present

## 2019-04-13 DIAGNOSIS — R339 Retention of urine, unspecified: Secondary | ICD-10-CM | POA: Diagnosis not present

## 2019-04-13 DIAGNOSIS — M797 Fibromyalgia: Secondary | ICD-10-CM

## 2019-04-26 ENCOUNTER — Emergency Department (HOSPITAL_COMMUNITY)
Admission: EM | Admit: 2019-04-26 | Discharge: 2019-04-27 | Disposition: A | Discharge status: Home / Self Care | Payer: Medicare Other | Attending: Emergency Medicine | Admitting: Emergency Medicine

## 2019-04-26 ENCOUNTER — Other Ambulatory Visit: Payer: Self-pay

## 2019-04-26 DIAGNOSIS — Z8673 Personal history of transient ischemic attack (TIA), and cerebral infarction without residual deficits: Secondary | ICD-10-CM | POA: Insufficient documentation

## 2019-04-26 DIAGNOSIS — I10 Essential (primary) hypertension: Secondary | ICD-10-CM | POA: Insufficient documentation

## 2019-04-26 DIAGNOSIS — Z4801 Encounter for change or removal of surgical wound dressing: Secondary | ICD-10-CM | POA: Insufficient documentation

## 2019-04-26 DIAGNOSIS — Z79899 Other long term (current) drug therapy: Secondary | ICD-10-CM | POA: Insufficient documentation

## 2019-04-26 DIAGNOSIS — L7634 Postprocedural seroma of skin and subcutaneous tissue following other procedure: Secondary | ICD-10-CM | POA: Diagnosis not present

## 2019-04-26 DIAGNOSIS — Z5189 Encounter for other specified aftercare: Secondary | ICD-10-CM

## 2019-04-26 DIAGNOSIS — Z7982 Long term (current) use of aspirin: Secondary | ICD-10-CM | POA: Insufficient documentation

## 2019-04-26 DIAGNOSIS — M545 Low back pain, unspecified: Secondary | ICD-10-CM

## 2019-04-26 LAB — CBC WITH DIFFERENTIAL/PLATELET
Abs Immature Granulocytes: 0.02 10*3/uL (ref 0.00–0.07)
Basophils Absolute: 0 10*3/uL (ref 0.0–0.1)
Basophils Relative: 0 %
Eosinophils Absolute: 0.1 10*3/uL (ref 0.0–0.5)
Eosinophils Relative: 1 %
HCT: 35 % — ABNORMAL LOW (ref 36.0–46.0)
Hemoglobin: 10.9 g/dL — ABNORMAL LOW (ref 12.0–15.0)
Immature Granulocytes: 0 %
Lymphocytes Relative: 45 %
Lymphs Abs: 4.4 10*3/uL — ABNORMAL HIGH (ref 0.7–4.0)
MCH: 29.2 pg (ref 26.0–34.0)
MCHC: 31.1 g/dL (ref 30.0–36.0)
MCV: 93.8 fL (ref 80.0–100.0)
Monocytes Absolute: 0.8 10*3/uL (ref 0.1–1.0)
Monocytes Relative: 8 %
Neutro Abs: 4.5 10*3/uL (ref 1.7–7.7)
Neutrophils Relative %: 46 %
Platelets: 327 10*3/uL (ref 150–400)
RBC: 3.73 MIL/uL — ABNORMAL LOW (ref 3.87–5.11)
RDW: 15 % (ref 11.5–15.5)
WBC: 9.8 10*3/uL (ref 4.0–10.5)
nRBC: 0 % (ref 0.0–0.2)

## 2019-04-26 LAB — BASIC METABOLIC PANEL
Anion gap: 8 (ref 5–15)
BUN: 19 mg/dL (ref 6–20)
CO2: 20 mmol/L — ABNORMAL LOW (ref 22–32)
Calcium: 8.8 mg/dL — ABNORMAL LOW (ref 8.9–10.3)
Chloride: 110 mmol/L (ref 98–111)
Creatinine, Ser: 0.87 mg/dL (ref 0.44–1.00)
GFR calc Af Amer: >60 mL/min (ref >60)
GFR calc non Af Amer: >60 mL/min (ref >60)
Glucose, Bld: 85 mg/dL (ref 70–99)
Potassium: 4.1 mmol/L (ref 3.5–5.1)
Sodium: 138 mmol/L (ref 135–145)

## 2019-04-26 NOTE — ED Notes (Signed)
Patient spouse Hopie Pellegrin asking for an update on patient would like a call back [phone]

## 2019-04-26 NOTE — ED Triage Notes (Signed)
Pt reports she had surgery to back on 6/5 and 7/5. Pt reports drainage since yesterday, with wound dehiscence. Pt denies fever, cough, or sick contact.

## 2019-04-27 ENCOUNTER — Emergency Department (HOSPITAL_COMMUNITY): Payer: Medicare Other

## 2019-04-27 MED ORDER — HYDROMORPHONE HCL 1 MG/ML IJ SOLN
1.0000 mg | Freq: Once | INTRAMUSCULAR | Status: AC
  Administered 2019-04-27: 1 mg via INTRAVENOUS
  Filled 2019-04-27: qty 1

## 2019-04-27 MED ORDER — GADOBUTROL 1 MMOL/ML IV SOLN
7.5000 mL | Freq: Once | INTRAVENOUS | Status: AC | PRN
  Administered 2019-04-27: 7.5 mL via INTRAVENOUS

## 2019-04-27 MED ORDER — FENTANYL CITRATE (PF) 100 MCG/2ML IJ SOLN
50.0000 ug | Freq: Once | INTRAMUSCULAR | Status: AC
  Administered 2019-04-27: 50 ug via INTRAVENOUS
  Filled 2019-04-27: qty 2

## 2019-04-27 NOTE — Discharge Instructions (Addendum)
Call your neurosurgeon and let them know about your repeat MRI.  You have a stable fluid collection which is thought to be postoperative seroma, which should resolve on its own.  If you develop fever, new numbness, weakness, tingling, or bowel/bladder incontinence return to the ER.

## 2019-04-27 NOTE — ED Provider Notes (Signed)
Patient seen/examined in the Emergency Department in conjunction with Advanced Practice Provider [facility] Patient reports increased pain and drainage from surgical wound to lumbar spine.   Exam : awake/alert, appears uncomfortable, bandage on lower back Plan: will need MRI lumbar spine for further evaluation     Ripley Fraise, MD 04/27/19 0210

## 2019-04-27 NOTE — ED Provider Notes (Addendum)
[facility] EMERGENCY DEPARTMENT Provider Note   CSN: [id number] Arrival date & time: 04/26/19  1647     History   Chief Complaint No chief complaint on file.   HPI Jodi Bowman is a 58 y.o. female.     Patient with recent lumbar laminectomy complicated by postoperative fluid collection with subsequent washout presents to the ED with a chief complaint of low back pain.  She states that the pain has been worsening.  States that the wound has been opening and has been draining green/yellow fluid.  She denies headache.  Denies bowel or bladder incontinence.  States that she has had persistent numbness in the left leg since the surgery.  She denies having had a fever.  Symptoms are worsened with movement and palpation.  The history is provided by the patient. No language interpreter was used.    Past Medical History:  Diagnosis Date  . Arthritis   . Calculus of gallbladder without mention of cholecystitis or obstruction   . Dizziness   . Fibromyalgia   . GERD (gastroesophageal reflux disease)   . Goiter   . Hypercholesteremia   . Hypertension   . Lower back pain   . Migraine   . Nontoxic uninodular goiter    sees dr vollmer at [facility]  . Obesity   . PONV (postoperative nausea and vomiting)   . Sleep apnea    STOPBANG=5  . Stroke [facility]) 2000    Patient Active Problem List   Diagnosis Date Noted  . Postoperative seroma involving nervous system after nervous system procedure 04/05/2019  . Drainage from wound 04/02/2019  . Infective otitis externa of left ear   . Left otitis media   . AKI (acute kidney injury) ([place])   . Urinary retention   . Orthostasis   . Acute blood loss anemia   . Fibromyalgia   . Chronic pain syndrome   . Lumbar radiculopathy 03/08/2019  . Orthostatic hypotension 03/06/2019  . Postoperative urinary retention 03/06/2019  . Lumbar foraminal stenosis 03/04/2019  . S/P exploratory laparotomy 03/06/2018  . Bowel  obstruction ([place]) 03/06/2018  . Chest pain, rule out acute myocardial infarction 12/17/2017  . Hypokalemia 12/17/2017  . Acute lower UTI 12/17/2017  . Vertigo 04/30/2017  . Small vessel disease, cerebrovascular 04/30/2017  . Chronic low back pain 08/01/2015  . Right hip pain 08/01/2015  . Abnormality of gait 08/01/2015  . Left leg weakness   . Low back pain with radiation   . Syncope 07/30/2014  . Atypical chest pain 03/12/2014  . Lap chole [facility] April 2013 01/29/2012  . Gallstones 12/04/2011  . Thyroid nodule-non neoplastic goiter by needle aspiration 12/04/2011  . GLUCOSE INTOLERANCE 03/12/2010  . DYSLIPIDEMIA 03/12/2010  . Chronic migraine 03/12/2010  . Carotid stenosis 03/12/2010  . CEREBROVASCULAR ACCIDENT 03/12/2010  . LIPOMA 01/29/2010  . HEADACHE 01/29/2010  . ANKLE INJURY, RIGHT 04/12/2009  . PHARYNGITIS 03/21/2009  . Backache 03/01/2009  . ALLERGIC RHINITIS 03/17/2007  . LOW BACK PAIN 03/17/2007  . Essential hypertension 01/01/2007  . ANXIETY STATE NOS 09/11/2005    Past Surgical History:  Procedure Laterality Date  . ABDOMINAL HYSTERECTOMY    . BOWEL RESECTION N/A 03/06/2018   Procedure: SMALL BOWEL ANASTAMOSIS;  Surgeon: Clovis Riley, MD;  Location: [place];  Service: General;  Laterality: N/A;  . CATARACT EXTRACTION W/ INTRAOCULAR LENS IMPLANT Bilateral   . CESAREAN SECTION  yrs ago   done x 2  . CHOLECYSTECTOMY  01/05/2012  Procedure: LAPAROSCOPIC CHOLECYSTECTOMY WITH INTRAOPERATIVE CHOLANGIOGRAM;  Surgeon: Pedro Earls, MD;  Location: [facility];  Service: General;  Laterality: N/A;  . COLONOSCOPY  10/08/2012   Procedure: COLONOSCOPY;  Surgeon: Beryle Beams, MD;  Location: [facility];  Service: Endoscopy;  Laterality: N/A;  . KNEE ARTHROSCOPY  one 1995 and 1 in 1997   both knees done  . LAPAROSCOPY N/A 03/06/2018   Procedure: LAPAROSCOPY DIAGNOSTIC WITH LYSIS OF ADHESIONS;  Surgeon: Clovis Riley, MD;  Location: [place];  Service: General;  Laterality:  N/A;  . LAPAROTOMY N/A 03/06/2018   Procedure: EXPLORATORY LAPAROTOMY, RESECTION OF DISTAL ROUX, CLOSURE OF INTERNAL HERNIA;  Surgeon: Clovis Riley, MD;  Location: [place];  Service: General;  Laterality: N/A;  . LUMBAR WOUND DEBRIDEMENT N/A 04/03/2019   Procedure: LUMBAR WOUND WASHOUT;  Surgeon: Judith Part, MD;  Location: [place];  Service: Neurosurgery;  Laterality: N/A;  . POSTERIOR LUMBAR FUSION  03/04/2019  . surgery for endometriosis  yrs ago  . thryoid biopsy  December 01, 2011    at [facility]     OB History   No obstetric history on file.      Home Medications    Prior to Admission medications   Medication Sig Start Date End Date Taking? Authorizing Provider  acetaminophen (TYLENOL) 325 MG tablet Take 2 tablets (650 mg total) by mouth every 4 (four) hours as needed for mild pain ((score 1 to 3) or temp > 100.5). 03/15/19   Angiulli, Lavon Paganini, PA-C  albuterol (PROVENTIL HFA) 108 (90 Base) MCG/ACT inhaler Inhale 1-2 puffs into the lungs every 6 (six) hours as needed for wheezing or shortness of breath.    [provider]  aMILoride (MIDAMOR) 5 MG tablet Take 1 tablet (5 mg total) by mouth daily. 03/15/19   Angiulli, Lavon Paganini, PA-C  amLODipine (NORVASC) 5 MG tablet Take 1 tablet (5 mg total) by mouth daily. 03/15/19   Angiulli, Lavon Paganini, PA-C  aspirin EC 81 MG tablet Take 81 mg by mouth daily.    [provider]  DULoxetine (CYMBALTA) 30 MG capsule Take 1 capsule (30 mg total) by mouth daily. 03/16/19   Angiulli, Lavon Paganini, PA-C  fluticasone (FLONASE) 50 MCG/ACT nasal spray Place 1 spray into both nostrils daily as needed for allergies.  11/08/17   [provider]  folic acid (FOLVITE) 1 MG tablet Take 1 mg by mouth daily. 10/14/17   [provider]  HYDROcodone-acetaminophen (NORCO) 10-325 MG tablet Take 1 tablet by mouth every 4 (four) hours as needed for moderate pain ((score 4 to 6)). 03/15/19   Angiulli, Lavon Paganini, PA-C  ibuprofen (ADVIL) 400 MG tablet  Take 1 tablet (400 mg total) by mouth every 8 (eight) hours as needed for moderate pain. 04/05/19   Arrien, Jimmy Picket, MD  loratadine (CLARITIN) 10 MG tablet Take 10 mg by mouth daily.     [provider]  losartan (COZAAR) 100 MG tablet Take 1 tablet (100 mg total) by mouth daily. 03/15/19   Angiulli, Lavon Paganini, PA-C  methocarbamol (ROBAXIN) 500 MG tablet Take 1 tablet (500 mg total) by mouth every 6 (six) hours as needed for muscle spasms. 03/15/19   Angiulli, Lavon Paganini, PA-C  metoprolol succinate (TOPROL-XL) 50 MG 24 hr tablet Take 1 tablet (50 mg total) by mouth daily. Take with or immediately following a meal. Patient taking differently: Take 50 mg by mouth at bedtime. Take with or immediately following a meal. 03/16/19  Angiulli, Lavon Paganini, PA-C  nitroGLYCERIN (NITROSTAT) 0.4 MG SL tablet Place 1 tablet (0.4 mg total) under the tongue every 5 (five) minutes x 3 doses as needed for chest pain. Patient taking differently: Place 0.4 mg under the tongue every 5 (five) minutes as needed for chest pain (MAXIMUM OF 3 DOSES).  03/13/14   Charolette Forward, MD  pantoprazole (PROTONIX) 40 MG tablet Take 1 tablet (40 mg total) by mouth 2 (two) times daily. 03/15/19   Angiulli, Lavon Paganini, PA-C  polyethylene glycol (MIRALAX / GLYCOLAX) 17 g packet Take 17 g by mouth daily as needed for mild constipation. Patient not taking: Reported on 04/02/2019 03/08/19   Viona Gilmore D, NP  pregabalin (LYRICA) 75 MG capsule Take 1 capsule (75 mg total) by mouth 3 (three) times daily. 03/15/19   Angiulli, Lavon Paganini, PA-C  Vitamin D, Ergocalciferol, (DRISDOL) 1.25 MG (50000 UT) CAPS capsule Take 1 capsule (50,000 Units total) by mouth every Monday, Wednesday, and Friday. 03/16/19   Angiulli, Lavon Paganini, PA-C  metoprolol (LOPRESSOR) 50 MG tablet Take 50 mg by mouth 2 (two) times daily.    12/04/11  [provider]  simvastatin (ZOCOR) 20 MG tablet Take 20 mg by mouth at bedtime.    12/04/11  [provider]     Family History Family History  Problem Relation Age of Onset  . Diabetes Mother   . Hypertension Mother   . Transient ischemic attack Mother   . Seizures Mother   . Dementia Mother   . Other Father        MVA  . Cancer Brother        colon and lung  . Cancer Maternal Grandmother        colon    Social History Social History   Tobacco Use  . Smoking status: Never Smoker  . Smokeless tobacco: Never Used  Substance Use Topics  . Alcohol use: Not Currently    Alcohol/week: 0.0 standard drinks  . Drug use: No     Allergies   Shrimp [shellfish allergy] and Naproxen   Review of Systems Review of Systems  All other systems reviewed and are negative.    Physical Exam Updated Vital Signs BP (!) 155/75 (BP Location: Right Arm)   Pulse 62   Temp 98 F (36.7 C) (Oral)   Resp 16   SpO2 100%   Physical Exam Physical Exam  Constitutional: Pt appears well-developed and well-nourished. No distress.  HENT:  Head: Normocephalic and atraumatic.  Mouth/Throat: Oropharynx is clear and moist. No oropharyngeal exudate.  Eyes: Conjunctivae are normal.  Neck: Normal range of motion. Neck supple.  No meningismus Cardiovascular: Normal rate, regular rhythm and intact distal pulses.   Pulmonary/Chest: Effort normal and breath sounds normal. No respiratory distress. Pt has no wheezes.  Abdominal: Pt exhibits no distension Musculoskeletal:  Lumbar spine tender to palpation surrounding surgical site, no frank discharge, no crepitus Lymphadenopathy: Pt has no cervical adenopathy.  Neurological: Pt is alert and oriented Speech is clear and goal oriented, follows commands Normal 5/5 strength in upper extremities bilaterally, 5/5 strength in RLE including dorsiflexion and plantar flexion, 4/5 strength in LLE limited by pain Sensation diminished in LLE Great toe extension intact Moves extremities without ataxia, coordination intact Skin: Skin is warm and dry. No rash noted. Pt is not  diaphoretic. No erythema.  Psychiatric: Pt has a normal mood and affect. Behavior is normal.  Nursing note and vitals reviewed.   ED Treatments / Results  Labs (all labs ordered are listed, but only abnormal results are displayed) Labs Reviewed  CBC WITH DIFFERENTIAL/PLATELET - Abnormal; Notable for the following components:      Result Value   RBC 3.73 (*)    Hemoglobin 10.9 (*)    HCT 35.0 (*)    Lymphs Abs 4.4 (*)    All other components within normal limits  BASIC METABOLIC PANEL - Abnormal; Notable for the following components:   CO2 20 (*)    Calcium 8.8 (*)    All other components within normal limits    EKG None  Radiology Mr Lumbar Spine W Wo Contrast  Result Date: 04/27/2019 CLINICAL DATA:  Initial evaluation for increased lower back pain and drainage from surgical wound. History of recent surgery on 03/04/2019, followed by debridement on 04/03/2019. EXAM: MRI LUMBAR SPINE WITHOUT AND WITH CONTRAST TECHNIQUE: Multiplanar and multiecho pulse sequences of the lumbar spine were obtained without and with intravenous contrast. CONTRAST:  7.5 cc of Gadavist. COMPARISON:  Prior MRI from 04/02/2019. FINDINGS: Segmentation: Examination mildly limited due to lack of axial precontrast T1 weighted sequence, not performed by technical mistake. Normal segmentation lowest well-formed disc labeled the L5-S1 level. Alignment: Vertebral bodies normally aligned with preservation of the normal lumbar lordosis. No listhesis or interval malalignment. Vertebrae: Susceptibility artifact from prior PLIF at L4-5 and L5-S1. Vertebral body height maintained without evidence for acute or interval fracture. Underlying bone marrow signal intensity within normal limits. No discrete or worrisome osseous lesions. No unexpected abnormal marrow edema or enhancement. No findings to suggest osteomyelitis discitis. Conus medullaris and cauda equina: Conus extends to the L1-2 level. Conus and cauda equina appear  normal. No evidence for arachnoiditis. Paraspinal and other soft tissues: Postoperative changes from recent PLIF and interval debridement seen within the lower lumbar spine. T2 hyperintense collection at the laminectomy site measures approximately 2.2 x 3.6 x 4.2 cm (AP by transverse by craniocaudad). Fairly smooth surrounding postcontrast enhancement without significant inflammatory change, most likely a postoperative seroma, similar to previous. CSF leak felt to be unlikely given the relative stability of this collection. Additional fluid collection along the lower midline incision at the subcutaneous fat is decreased in size from previous, likely due to interval debridement. Collection now measures approximately 3.2 x 1.2 x 6.5 cm (series 8, image 29). Collection appears to communicate with the overlying skin, and likely reflects the site of drainage. This collection does not definitely extend deep to the underlying fascia, and is not definitely communicate with the previously mention collection at the laminectomy site. Collection also favored to reflect a postoperative seroma. Paraspinous soft tissues demonstrate no other acute finding. Few scattered T2 hyperintense simple renal cysts noted bilaterally. Visualized visceral structures otherwise unremarkable. Disc levels: L1-2:  Minimal annular disc bulge.  No stenosis or impingement. L2-3:  Unremarkable. L3-4: Negative interspace. Minimal bilateral facet degeneration. No canal or foraminal stenosis. L4-5: Status post PLIF with wide posterior decompression. No residual or recurrent canal or foraminal stenosis. L5-S1: Status post PLIF with wide posterior decompression. No residual canal or foraminal stenosis. IMPRESSION: 1. Postoperative changes from recent PLIF at L4-5 and L5-S1 without residual or recurrent stenosis. 2. Residual 2.2 x 3.6 x 4.2 cm collection at the laminectomy site, relatively stable from previous, and felt to be most consistent with a benign  postoperative seroma. CSF leak felt to be unlikely given the stability of this finding. 3. Additional 3.2 x 1.2 x 6.5 cm collection within the subcutaneous soft tissues along the lower midline  incision, decreased in size from previous, and favored to reflect a residual and/or recurrent postoperative seroma status post recent debridement. Superimposed infection felt to be unlikely, although fluid sampling suggested as clinically warranted, as this appears to drain to the overlying skin. 4. No other new or significant findings within the lumbar spine. Electronically Signed   By: Jeannine Boga M.D.   On: 04/27/2019 03:54    Procedures Procedures (including critical care time)  Medications Ordered in ED Medications  HYDROmorphone (DILAUDID) injection 1 mg (has no administration in time range)  fentaNYL (SUBLIMAZE) injection 50 mcg (50 mcg Intravenous Given 04/27/19 0132)  gadobutrol (GADAVIST) 1 MMOL/ML injection 7.5 mL (7.5 mLs Intravenous Contrast Given 04/27/19 0332)     Initial Impression / Assessment and Plan / ED Course  I have reviewed the triage vital signs and the nursing notes.  Pertinent labs & imaging results that were available during my care of the patient were reviewed by me and considered in my medical decision making (see chart for details).        Patient with recent lumbar laminectomy.  Had postoperative fluid collection, which was washed out in the OR.  Worsening symptoms now.  Will check lumbar MRI.  Reports persistent numbness of left leg since the surgery.  MRI shows residual postoperative seroma.  On physical exam, there is no purulent discharge or erythema.  Doubt infection.  Vital signs are stable.  Patient is afebrile.  She does not have a leukocytosis.  She has no new neuro deficits.  Patient advised to follow-up with her neurosurgeon.  She will call them in the morning.  Return precautions discussed.  Final Clinical Impressions(s) / ED Diagnoses   Final  diagnoses:  Visit for wound check  Midline low back pain without sciatica, unspecified chronicity    ED Discharge Orders    None          Montine Circle, PA-C 04/27/19 [id number]    Ripley Fraise, MD 04/27/19 [phone]

## 2019-04-27 NOTE — ED Notes (Signed)
Family at bedside. 

## 2019-04-27 NOTE — ED Notes (Signed)
ED Provider at bedside. 

## 2019-04-27 NOTE — ED Notes (Signed)
Patient transported to MRI 

## 2019-05-14 ENCOUNTER — Other Ambulatory Visit: Payer: Self-pay

## 2019-05-14 ENCOUNTER — Emergency Department (HOSPITAL_COMMUNITY): Payer: Medicare Other

## 2019-05-14 ENCOUNTER — Emergency Department (HOSPITAL_COMMUNITY)
Admission: EM | Admit: 2019-05-14 | Discharge: 2019-05-14 | Disposition: A | Discharge status: Home / Self Care | Payer: Medicare Other | Attending: Emergency Medicine | Admitting: Emergency Medicine

## 2019-05-14 DIAGNOSIS — Z79899 Other long term (current) drug therapy: Secondary | ICD-10-CM | POA: Insufficient documentation

## 2019-05-14 DIAGNOSIS — M545 Low back pain, unspecified: Secondary | ICD-10-CM

## 2019-05-14 DIAGNOSIS — W06XXXA Fall from bed, initial encounter: Secondary | ICD-10-CM | POA: Diagnosis not present

## 2019-05-14 DIAGNOSIS — Y929 Unspecified place or not applicable: Secondary | ICD-10-CM | POA: Diagnosis not present

## 2019-05-14 DIAGNOSIS — G8918 Other acute postprocedural pain: Secondary | ICD-10-CM | POA: Insufficient documentation

## 2019-05-14 DIAGNOSIS — Y939 Activity, unspecified: Secondary | ICD-10-CM | POA: Diagnosis not present

## 2019-05-14 DIAGNOSIS — Z7982 Long term (current) use of aspirin: Secondary | ICD-10-CM | POA: Insufficient documentation

## 2019-05-14 DIAGNOSIS — I10 Essential (primary) hypertension: Secondary | ICD-10-CM | POA: Insufficient documentation

## 2019-05-14 DIAGNOSIS — R200 Anesthesia of skin: Secondary | ICD-10-CM | POA: Diagnosis not present

## 2019-05-14 DIAGNOSIS — Y999 Unspecified external cause status: Secondary | ICD-10-CM | POA: Diagnosis not present

## 2019-05-14 MED ORDER — OXYCODONE HCL 5 MG PO TABS
10.0000 mg | ORAL_TABLET | Freq: Once | ORAL | Status: AC
  Administered 2019-05-14: 10 mg via ORAL
  Filled 2019-05-14: qty 2

## 2019-05-14 NOTE — ED Provider Notes (Signed)
[facility] EMERGENCY DEPARTMENT Provider Note   CSN: [id number] Arrival date & time: 05/14/19  1335    History   Chief Complaint Chief Complaint  Patient presents with  . Back Pain  . Fall    HPI Jodi Bowman is a 58 y.o. female.     HPI   58 year old female presents status post fall.  She notes that on 03/04/2019 she underwent posterior lumbar interbody fusion of lumbar 4 and 5 5 and S1 performed by Dr. Trenton Gammon of neurosurgery.  She developed an infection to that region and subsequently had a washout on 04/03/2019.  She notes that she has intermittent pain in her lower back, notes that 2 days ago she did need her oxycodone but none yesterday.  She was in a bed that she is not used to and misjudged the edge when she rolled out and landed on her back.  She notes immediate pain to the lower region.  She notes chronic numbness to the left lateral leg, but denies any changes, no neurological deficits on the right.  She took 10 mg of oxycodone approximately 5 hours prior to arrival.  Past Medical History:  Diagnosis Date  . Arthritis   . Calculus of gallbladder without mention of cholecystitis or obstruction   . Dizziness   . Fibromyalgia   . GERD (gastroesophageal reflux disease)   . Goiter   . Hypercholesteremia   . Hypertension   . Lower back pain   . Migraine   . Nontoxic uninodular goiter    sees dr vollmer at [employer]  . Obesity   . PONV (postoperative nausea and vomiting)   . Sleep apnea    STOPBANG=5  . Stroke [facility]) 2000    Patient Active Problem List   Diagnosis Date Noted  . Postoperative seroma involving nervous system after nervous system procedure 04/05/2019  . Drainage from wound 04/02/2019  . Infective otitis externa of left ear   . Left otitis media   . AKI (acute kidney injury) ([place])   . Urinary retention   . Orthostasis   . Acute blood loss anemia   . Fibromyalgia   . Chronic pain syndrome   . Lumbar radiculopathy 03/08/2019   . Orthostatic hypotension 03/06/2019  . Postoperative urinary retention 03/06/2019  . Lumbar foraminal stenosis 03/04/2019  . S/P exploratory laparotomy 03/06/2018  . Bowel obstruction ([place]) 03/06/2018  . Chest pain, rule out acute myocardial infarction 12/17/2017  . Hypokalemia 12/17/2017  . Acute lower UTI 12/17/2017  . Vertigo 04/30/2017  . Small vessel disease, cerebrovascular 04/30/2017  . Chronic low back pain 08/01/2015  . Right hip pain 08/01/2015  . Abnormality of gait 08/01/2015  . Left leg weakness   . Low back pain with radiation   . Syncope 07/30/2014  . Atypical chest pain 03/12/2014  . Lap chole [facility] April 2013 01/29/2012  . Gallstones 12/04/2011  . Thyroid nodule-non neoplastic goiter by needle aspiration 12/04/2011  . GLUCOSE INTOLERANCE 03/12/2010  . DYSLIPIDEMIA 03/12/2010  . Chronic migraine 03/12/2010  . Carotid stenosis 03/12/2010  . CEREBROVASCULAR ACCIDENT 03/12/2010  . LIPOMA 01/29/2010  . HEADACHE 01/29/2010  . ANKLE INJURY, RIGHT 04/12/2009  . PHARYNGITIS 03/21/2009  . Backache 03/01/2009  . ALLERGIC RHINITIS 03/17/2007  . LOW BACK PAIN 03/17/2007  . Essential hypertension 01/01/2007  . ANXIETY STATE NOS 09/11/2005    Past Surgical History:  Procedure Laterality Date  . ABDOMINAL HYSTERECTOMY    . BOWEL RESECTION N/A 03/06/2018   Procedure: SMALL  BOWEL ANASTAMOSIS;  Surgeon: Clovis Riley, MD;  Location: [facility];  Service: General;  Laterality: N/A;  . CATARACT EXTRACTION W/ INTRAOCULAR LENS IMPLANT Bilateral   . CESAREAN SECTION  yrs ago   done x 2  . CHOLECYSTECTOMY  01/05/2012   Procedure: LAPAROSCOPIC CHOLECYSTECTOMY WITH INTRAOPERATIVE CHOLANGIOGRAM;  Surgeon: Pedro Earls, MD;  Location: [facility];  Service: General;  Laterality: N/A;  . COLONOSCOPY  10/08/2012   Procedure: COLONOSCOPY;  Surgeon: Beryle Beams, MD;  Location: [facility];  Service: Endoscopy;  Laterality: N/A;  . KNEE ARTHROSCOPY  one 1995 and 1 in 1997   both knees  done  . LAPAROSCOPY N/A 03/06/2018   Procedure: LAPAROSCOPY DIAGNOSTIC WITH LYSIS OF ADHESIONS;  Surgeon: Clovis Riley, MD;  Location: [facility];  Service: General;  Laterality: N/A;  . LAPAROTOMY N/A 03/06/2018   Procedure: EXPLORATORY LAPAROTOMY, RESECTION OF DISTAL ROUX, CLOSURE OF INTERNAL HERNIA;  Surgeon: Clovis Riley, MD;  Location: [facility];  Service: General;  Laterality: N/A;  . LUMBAR WOUND DEBRIDEMENT N/A 04/03/2019   Procedure: LUMBAR WOUND WASHOUT;  Surgeon: Judith Part, MD;  Location: [facility];  Service: Neurosurgery;  Laterality: N/A;  . POSTERIOR LUMBAR FUSION  03/04/2019  . surgery for endometriosis  yrs ago  . thryoid biopsy  December 01, 2011    at [facility]     OB History   No obstetric history on file.      Home Medications    Prior to Admission medications   Medication Sig Start Date End Date Taking? Authorizing Provider  acetaminophen (TYLENOL) 325 MG tablet Take 2 tablets (650 mg total) by mouth every 4 (four) hours as needed for mild pain ((score 1 to 3) or temp > 100.5). 03/15/19   Angiulli, Lavon Paganini, PA-C  albuterol (PROVENTIL HFA) 108 (90 Base) MCG/ACT inhaler Inhale 1-2 puffs into the lungs every 6 (six) hours as needed for wheezing or shortness of breath.    [provider]  aMILoride (MIDAMOR) 5 MG tablet Take 1 tablet (5 mg total) by mouth daily. 03/15/19   Angiulli, Lavon Paganini, PA-C  amLODipine (NORVASC) 5 MG tablet Take 1 tablet (5 mg total) by mouth daily. 03/15/19   Angiulli, Lavon Paganini, PA-C  aspirin EC 81 MG tablet Take 81 mg by mouth daily.    [provider]  DULoxetine (CYMBALTA) 30 MG capsule Take 1 capsule (30 mg total) by mouth daily. 03/16/19   Angiulli, Lavon Paganini, PA-C  fluticasone (FLONASE) 50 MCG/ACT nasal spray Place 1 spray into both nostrils daily as needed for allergies.  11/08/17   [provider]  folic acid (FOLVITE) 1 MG tablet Take 1 mg by mouth daily. 10/14/17   [provider]  HYDROcodone-acetaminophen (NORCO)  10-325 MG tablet Take 1 tablet by mouth every 4 (four) hours as needed for moderate pain ((score 4 to 6)). 03/15/19   Angiulli, Lavon Paganini, PA-C  ibuprofen (ADVIL) 400 MG tablet Take 1 tablet (400 mg total) by mouth every 8 (eight) hours as needed for moderate pain. 04/05/19   Arrien, Jimmy Picket, MD  loratadine (CLARITIN) 10 MG tablet Take 10 mg by mouth daily.     [provider]  losartan (COZAAR) 100 MG tablet Take 1 tablet (100 mg total) by mouth daily. 03/15/19   Angiulli, Lavon Paganini, PA-C  methocarbamol (ROBAXIN) 500 MG tablet Take 1 tablet (500 mg total) by mouth every 6 (six) hours as needed for muscle spasms. 03/15/19   Lauraine Rinne  J, PA-C  metoprolol succinate (TOPROL-XL) 50 MG 24 hr tablet Take 1 tablet (50 mg total) by mouth daily. Take with or immediately following a meal. Patient taking differently: Take 50 mg by mouth at bedtime. Take with or immediately following a meal. 03/16/19   Angiulli, Lavon Paganini, PA-C  nitroGLYCERIN (NITROSTAT) 0.4 MG SL tablet Place 1 tablet (0.4 mg total) under the tongue every 5 (five) minutes x 3 doses as needed for chest pain. Patient taking differently: Place 0.4 mg under the tongue every 5 (five) minutes as needed for chest pain (MAXIMUM OF 3 DOSES).  03/13/14   Charolette Forward, MD  pantoprazole (PROTONIX) 40 MG tablet Take 1 tablet (40 mg total) by mouth 2 (two) times daily. 03/15/19   Angiulli, Lavon Paganini, PA-C  polyethylene glycol (MIRALAX / GLYCOLAX) 17 g packet Take 17 g by mouth daily as needed for mild constipation. Patient not taking: Reported on 04/02/2019 03/08/19   Viona Gilmore D, NP  pregabalin (LYRICA) 75 MG capsule Take 1 capsule (75 mg total) by mouth 3 (three) times daily. 03/15/19   Angiulli, Lavon Paganini, PA-C  Vitamin D, Ergocalciferol, (DRISDOL) 1.25 MG (50000 UT) CAPS capsule Take 1 capsule (50,000 Units total) by mouth every Monday, Wednesday, and Friday. 03/16/19   Angiulli, Lavon Paganini, PA-C  metoprolol (LOPRESSOR) 50 MG tablet Take 50 mg  by mouth 2 (two) times daily.    12/04/11  [provider]  simvastatin (ZOCOR) 20 MG tablet Take 20 mg by mouth at bedtime.    12/04/11  [provider]    Family History Family History  Problem Relation Age of Onset  . Diabetes Mother   . Hypertension Mother   . Transient ischemic attack Mother   . Seizures Mother   . Dementia Mother   . Other Father        MVA  . Cancer Brother        colon and lung  . Cancer Maternal Grandmother        colon    Social History Social History   Tobacco Use  . Smoking status: Never Smoker  . Smokeless tobacco: Never Used  Substance Use Topics  . Alcohol use: Not Currently    Alcohol/week: 0.0 standard drinks  . Drug use: No     Allergies   Shrimp [shellfish allergy] and Naproxen   Review of Systems Review of Systems  All other systems reviewed and are negative.    Physical Exam Updated Vital Signs BP 117/73 (BP Location: Right Arm)   Pulse (!) 56   Temp 98.2 F (36.8 C) (Oral)   Resp 16   SpO2 98%   Physical Exam Vitals signs and nursing note reviewed.  Constitutional:      Appearance: She is well-developed.  HENT:     Head: Normocephalic and atraumatic.  Eyes:     General: No scleral icterus.       Right eye: No discharge.        Left eye: No discharge.     Conjunctiva/sclera: Conjunctivae normal.     Pupils: Pupils are equal, round, and reactive to light.  Neck:     Musculoskeletal: Normal range of motion.     Vascular: No JVD.     Trachea: No tracheal deviation.  Pulmonary:     Effort: Pulmonary effort is normal.     Breath sounds: No stridor.  Musculoskeletal:     Comments: Surgical incision to the midline low back is clean with no discharge,  she has tenderness throughout the lower lumbar region diffusely nonfocal-left lower extremity with decreased sensation throughout strength 5 out of 5, right lower extremity with 5/5 strength sensation intact throughout  Neurological:     Mental Status:  She is alert and oriented to person, place, and time.     Coordination: Coordination normal.  Psychiatric:        Behavior: Behavior normal.        Thought Content: Thought content normal.        Judgment: Judgment normal.      ED Treatments / Results  Labs (all labs ordered are listed, but only abnormal results are displayed) Labs Reviewed - No data to display  EKG None  Radiology Dg Lumbar Spine Complete  Result Date: 05/14/2019 CLINICAL DATA:  Pt states she fell out of bed this morning onto her back. She complains of pain in the center of her lower back which radiates down both legs. The pain is constant and goes from sharp pain in her back to tingling as it moves down.*comment was truncated*low back pain SP fall HX of repair EXAM: LUMBAR SPINE - COMPLETE 4+ VIEW COMPARISON:  None. FINDINGS: Posterior lumbar fusion from L 4 to S1 with posterior fusion rods and pedicle screws. No acute loss vertebral body height or disc height. No subluxation. IMPRESSION: 1. No acute findings lumbar spine. 2. Posterior lumbar fusion without failure. Electronically Signed   By: Suzy Bouchard M.D.   On: 05/14/2019 17:19    Procedures Procedures (including critical care time)  Medications Ordered in ED Medications  oxyCODONE (Oxy IR/ROXICODONE) immediate release tablet 10 mg (10 mg Oral Given 05/14/19 1628)     Initial Impression / Assessment and Plan / ED Course  I have reviewed the triage vital signs and the nursing notes.  Pertinent labs & imaging results that were available during my care of the patient were reviewed by me and considered in my medical decision making (see chart for details).        58 year old female presents today with acute on chronic low back pain.  Patient had a fall, hardware appears to be in place, she has no changes in her distal neurological exam.  Pain slightly improved with oxycodone here.  Patient will continue using oxycodone at home as directed, follow-up  with neurosurgery symptoms persist and return immediately if they worsen.  She verbalized understanding and agreement to today's plan had no further questions or concerns at the time of discharge.  Final Clinical Impressions(s) / ED Diagnoses   Final diagnoses:  Acute midline low back pain without sciatica    ED Discharge Orders    None       Okey Regal, PA-C 05/14/19 [place], [place], DO 05/14/19 2157

## 2019-05-14 NOTE — ED Notes (Signed)
Patient verbalizes understanding of discharge instructions . Opportunity for questions and answers were provided . Armband removed by staff ,Pt discharged from ED. W/C  offered at D/C  and Declined W/C at D/C and was escorted to lobby by RN.  

## 2019-05-14 NOTE — Discharge Instructions (Addendum)
Please read attached information. If you experience any new or worsening signs or symptoms please return to the emergency room for evaluation. Please follow-up with your primary care provider or specialist as discussed.  Please use previously prescribed medication as directed.

## 2019-05-14 NOTE — ED Notes (Signed)
Pt back from X-ray.  

## 2019-05-14 NOTE — ED Triage Notes (Signed)
Pt. Stated, I fell out of bed last night and hurt my lower back. I have had back surgery this year. 2 one in June and one july

## 2019-05-14 NOTE — ED Notes (Addendum)
Patient transported to X-ray 

## 2019-08-04 ENCOUNTER — Other Ambulatory Visit: Payer: Self-pay | Admitting: Neurosurgery

## 2019-08-04 DIAGNOSIS — M48061 Spinal stenosis, lumbar region without neurogenic claudication: Secondary | ICD-10-CM

## 2019-08-06 IMAGING — CT CT PELVIS W/ CM
2 of 3 series · 17 of 46 positions shown, 19 images · IV contrast (Omni 300)
Comparison: Radiograph dated 09/04/2018 and CT of the abdomen
pelvis dated 03/06/2018

CLINICAL DATA: 48-year-old female with swelling hips. No known
trauma.

EXAM:
CT PELVIS WITH CONTRAST
TECHNIQUE: Multidetector CT imaging of the pelvis was performed using the
standard protocol following the bolus administration of intravenous
contrast.
CONTRAST:  100mL OMNIPAQUE IOHEXOL 300 MG/ML  SOLN

[Series 3: a/p w/ 5mm · axial · 0.96mm/px · z∈[-473,-203]mm · 14 of 64 slices shown, 16 images]
[im 5/64  soft-tissue]
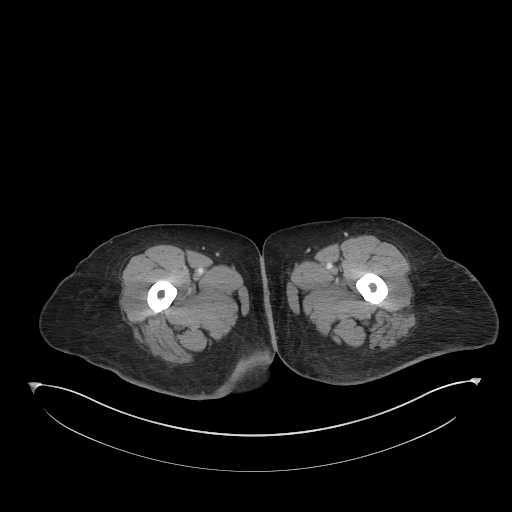
[im 5/64  bone]
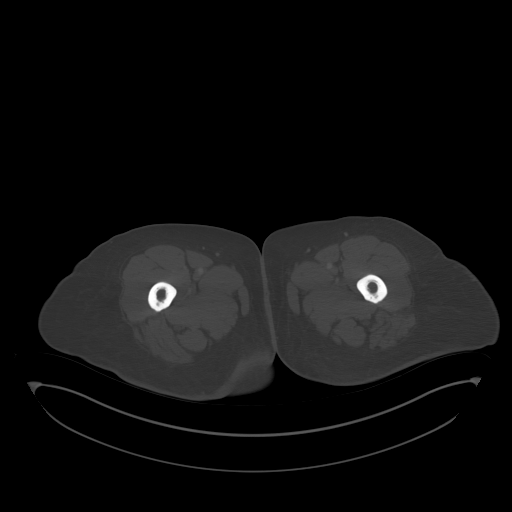
[im 9/64  soft-tissue]
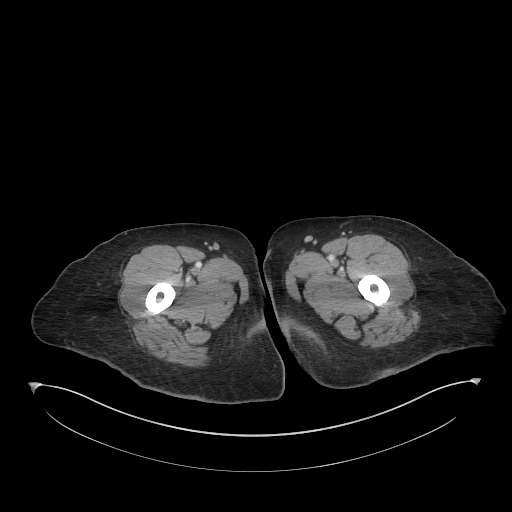
[im 13/64  soft-tissue]
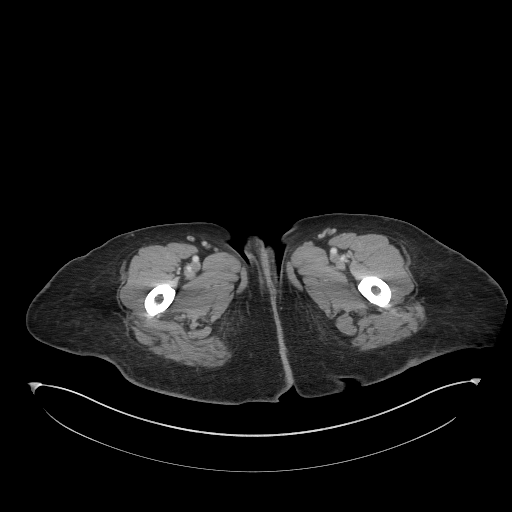
[im 17/64  soft-tissue]
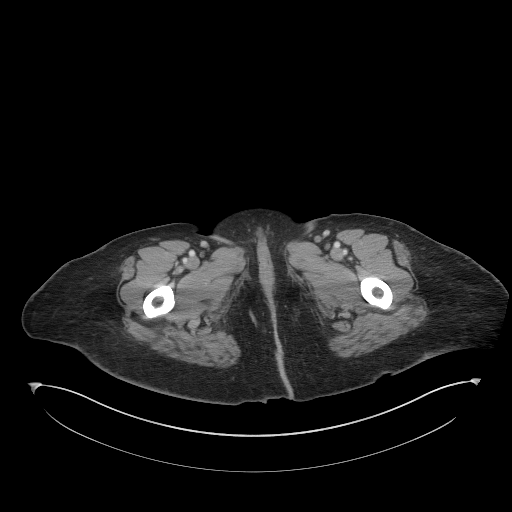
[im 21/64  soft-tissue]
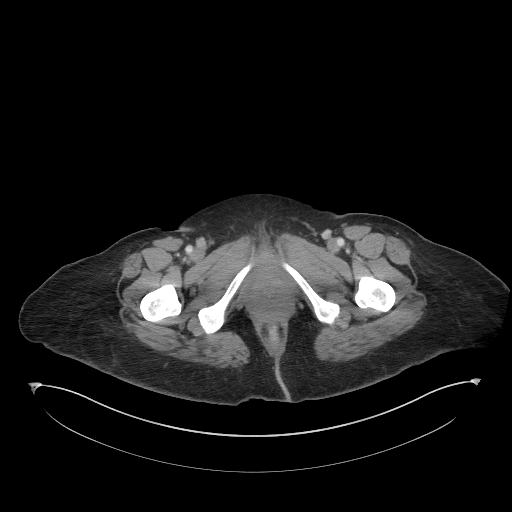
[im 25/64  soft-tissue]
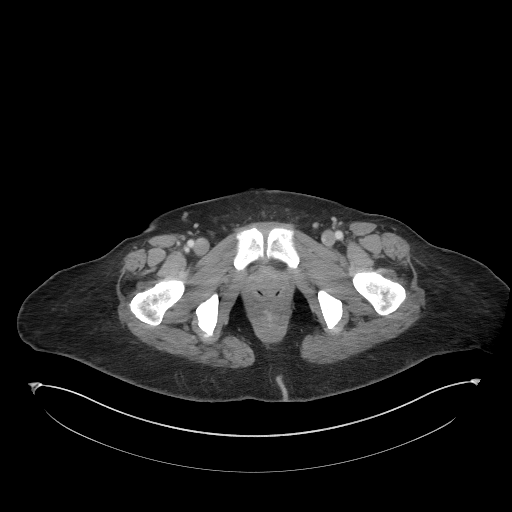
[im 29/64  soft-tissue]
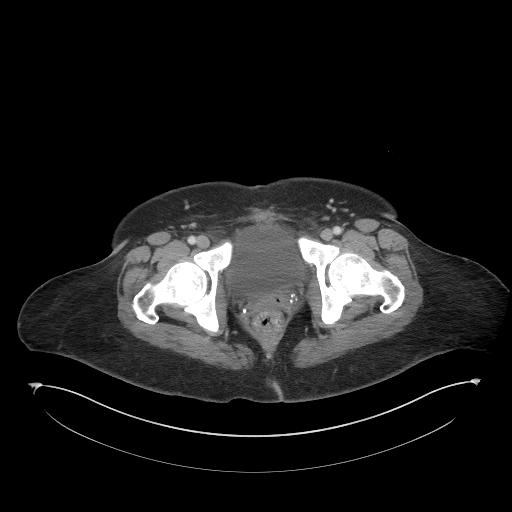
[im 35/64  soft-tissue]
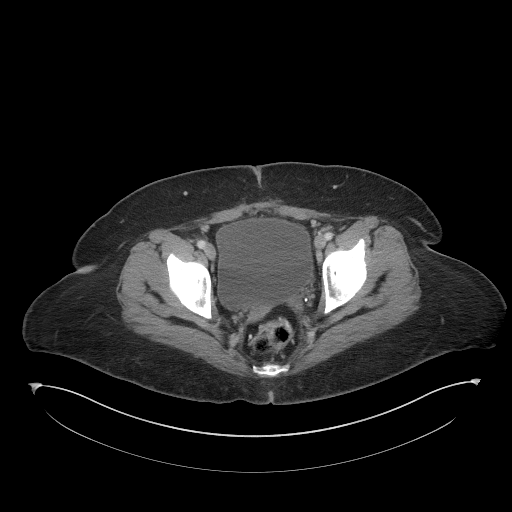
[im 39/64  soft-tissue]
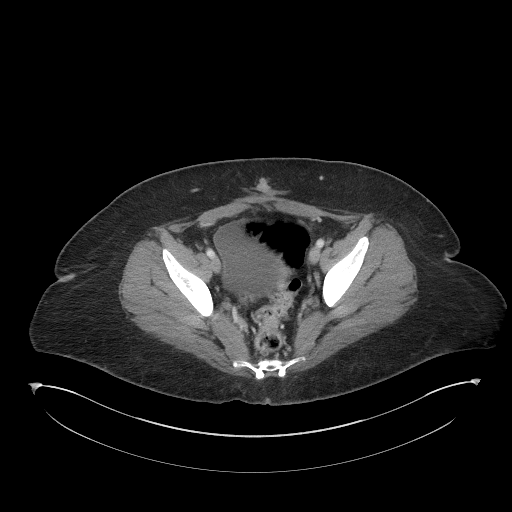
[im 39/64  bone]
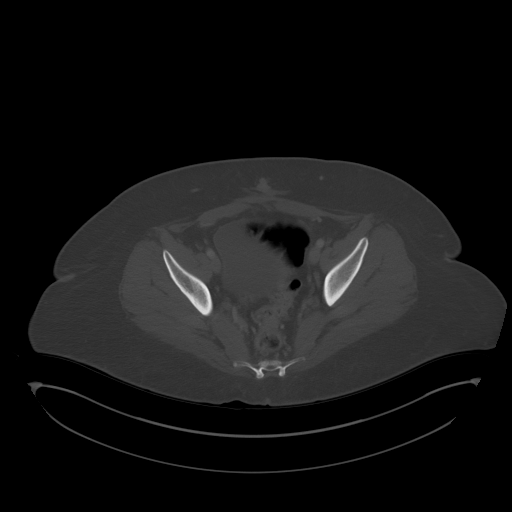
[im 43/64  soft-tissue]
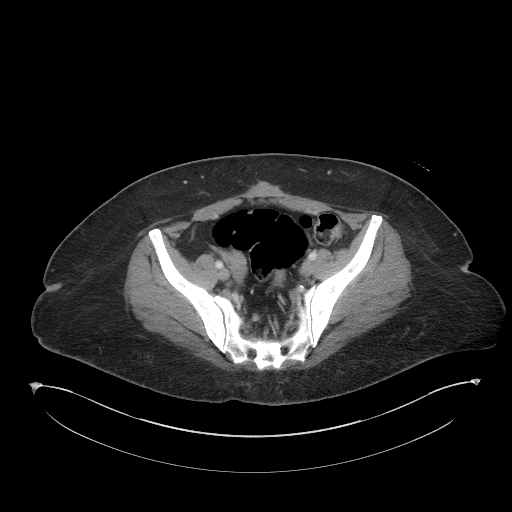
[im 47/64  soft-tissue]
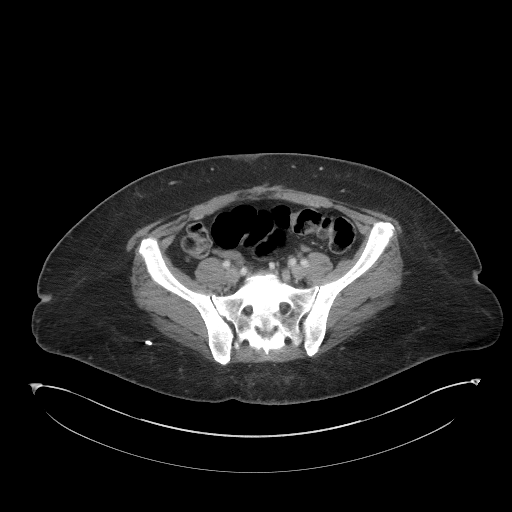
[im 51/64  soft-tissue]
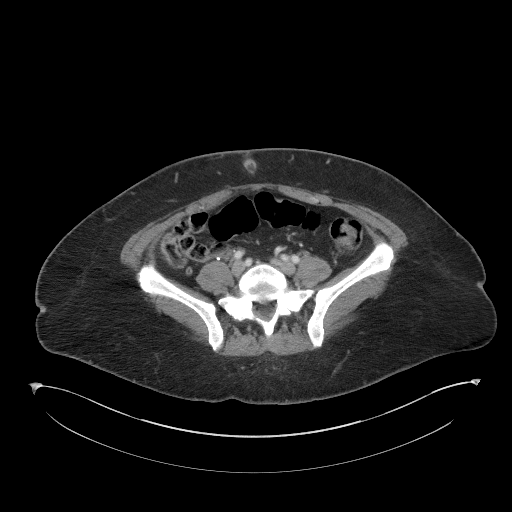
[im 55/64  soft-tissue]
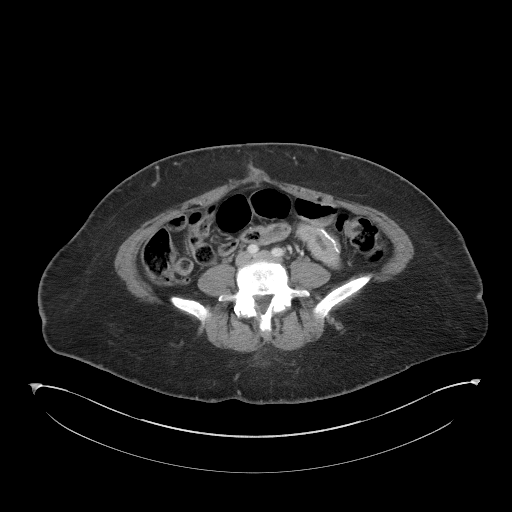
[im 59/64  soft-tissue]
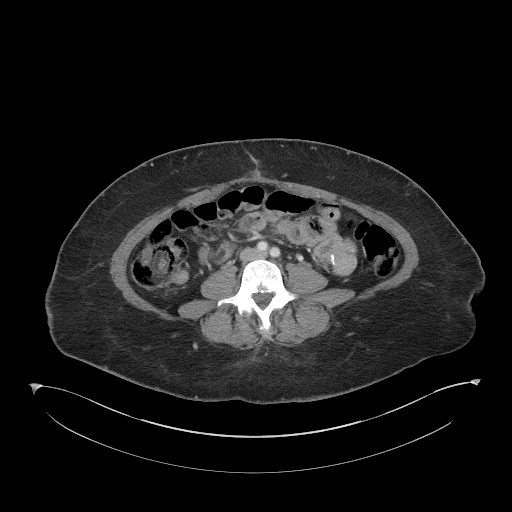

[Series 5: a/p w/ cor · coronal · 0.62mm/px · 3 of 121 slices shown]
[im 41/121  soft-tissue]
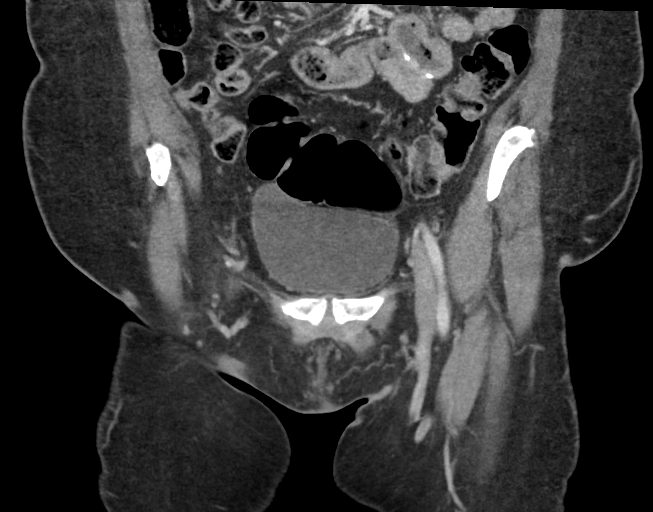
[im 54/121  soft-tissue]
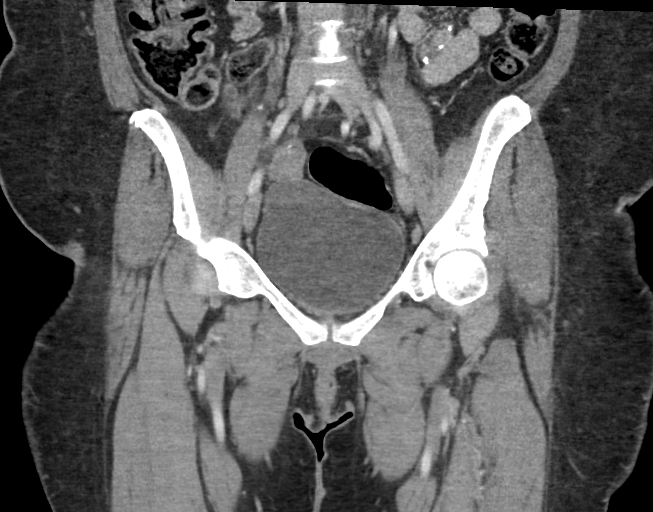
[im 67/121  soft-tissue]
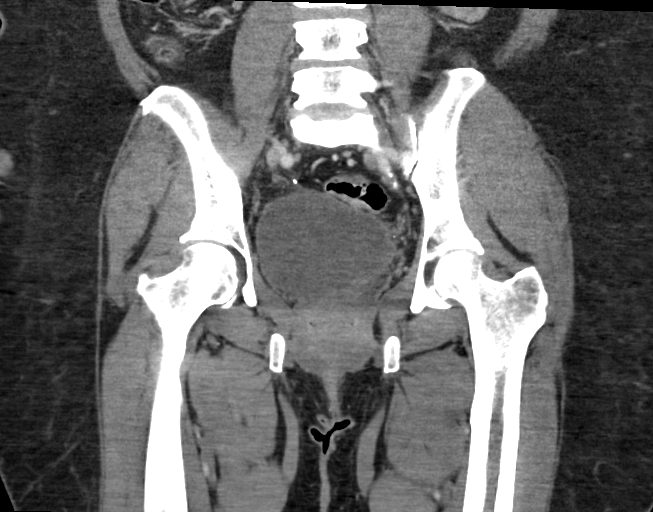

[17 of 46 positions shown; findings below may reference images not displayed]

FINDINGS: Urinary Tract:  No abnormality visualized.

Bowel: Postsurgical changes of bowel resection with anastomotic
suture in the left lower abdomen. No bowel dilatation or active
inflammation in the pelvis. The appendix is normal.

Vascular/Lymphatic: No pathologically enlarged lymph nodes. No
significant vascular abnormality seen.

Reproductive: Hysterectomy. No pelvic mass. The right ovary is
unremarkable. The left ovary is not visualized.

Other:  Midline vertical anterior pelvic wall incisional scar.

Musculoskeletal: No suspicious bone lesions identified.
IMPRESSION: No acute findings.  No fracture or fluid collection.

## 2019-08-06 IMAGING — DX DG HIP (WITH OR WITHOUT PELVIS) 2-3V*R*
3 series · 3 of 3 positions shown · non-contrast
Comparison: 09/06/2015

CLINICAL DATA: Right hip pain.

EXAM:
DG HIP (WITH OR WITHOUT PELVIS) 2-3V RIGHT

[t pelvis ap]
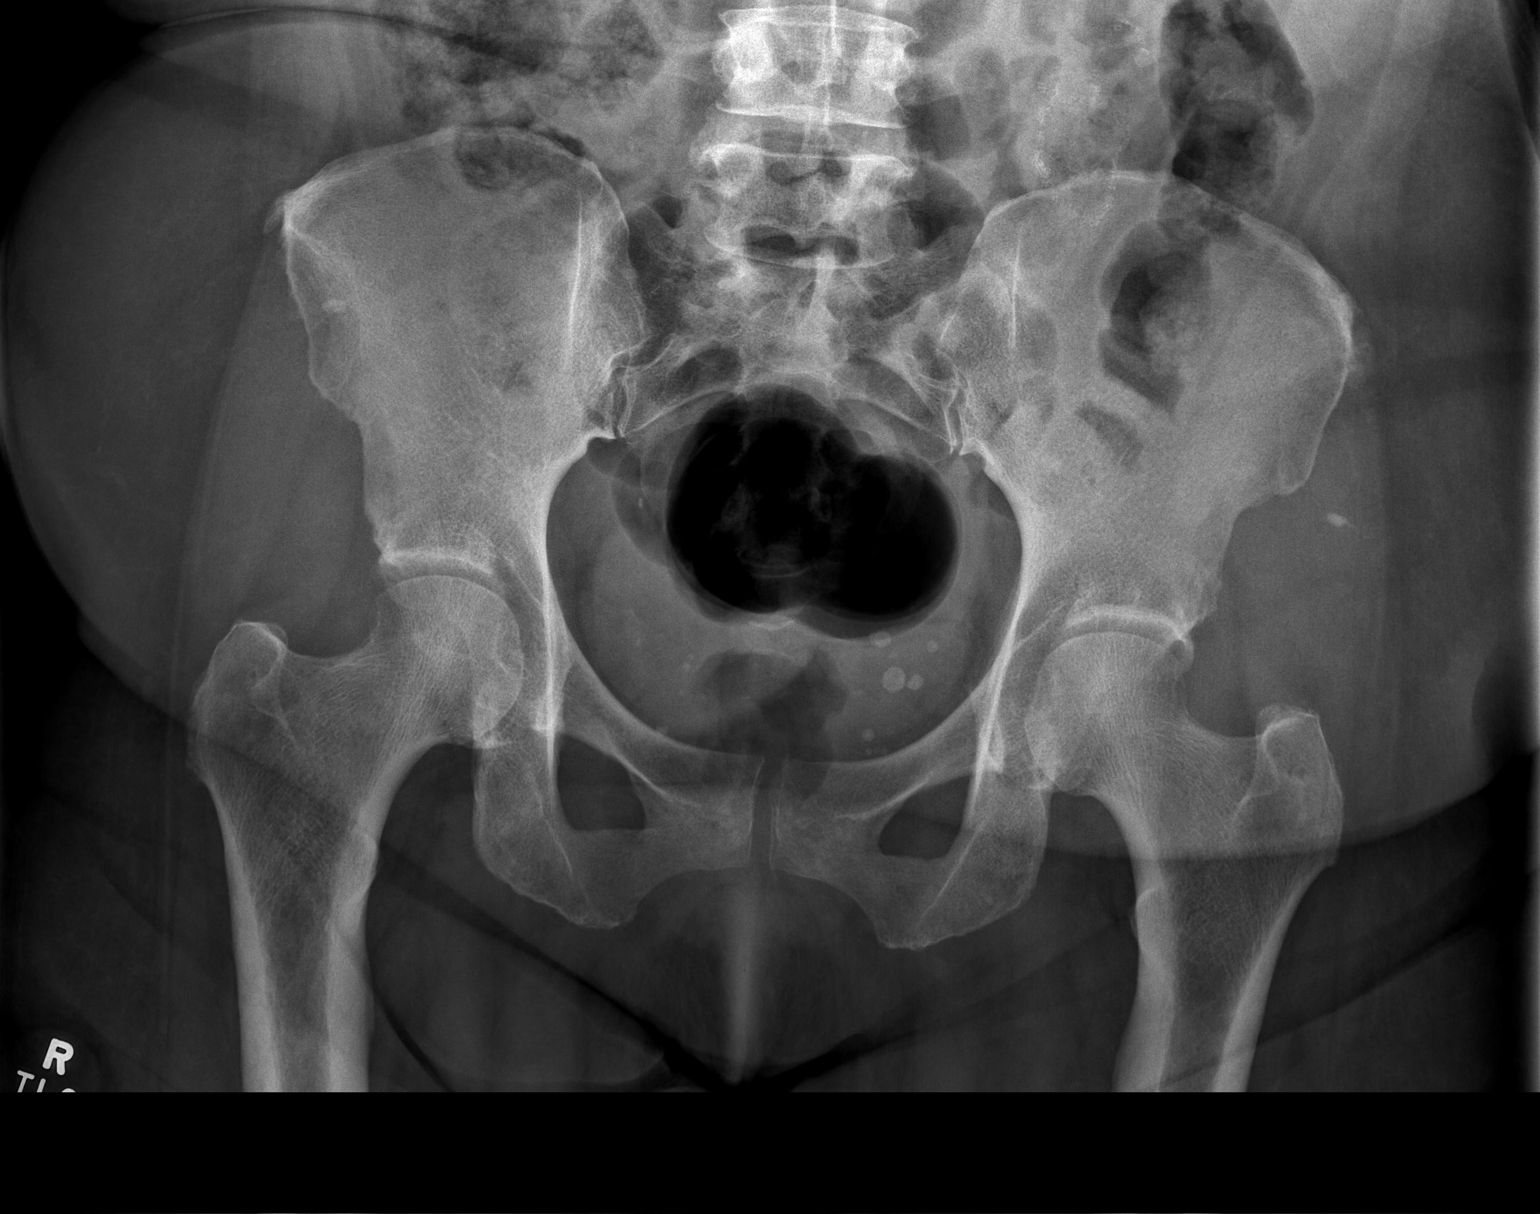

[t hip ap right]
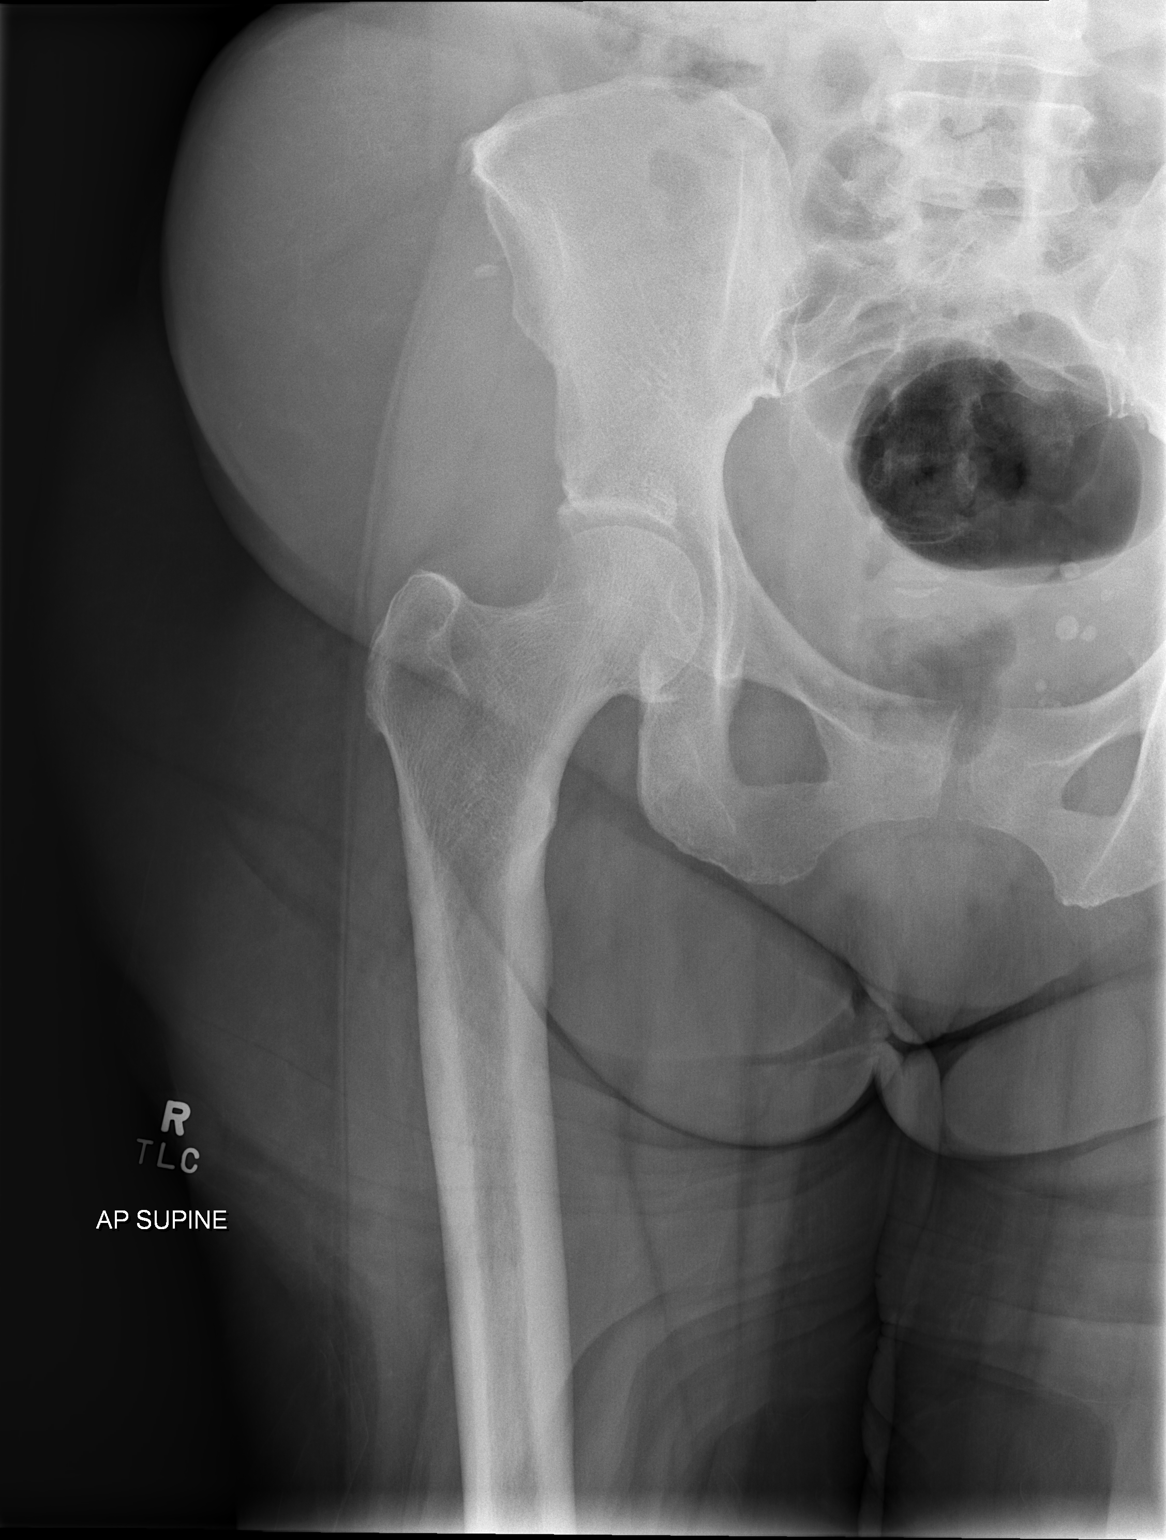

[t hip frog leg right]
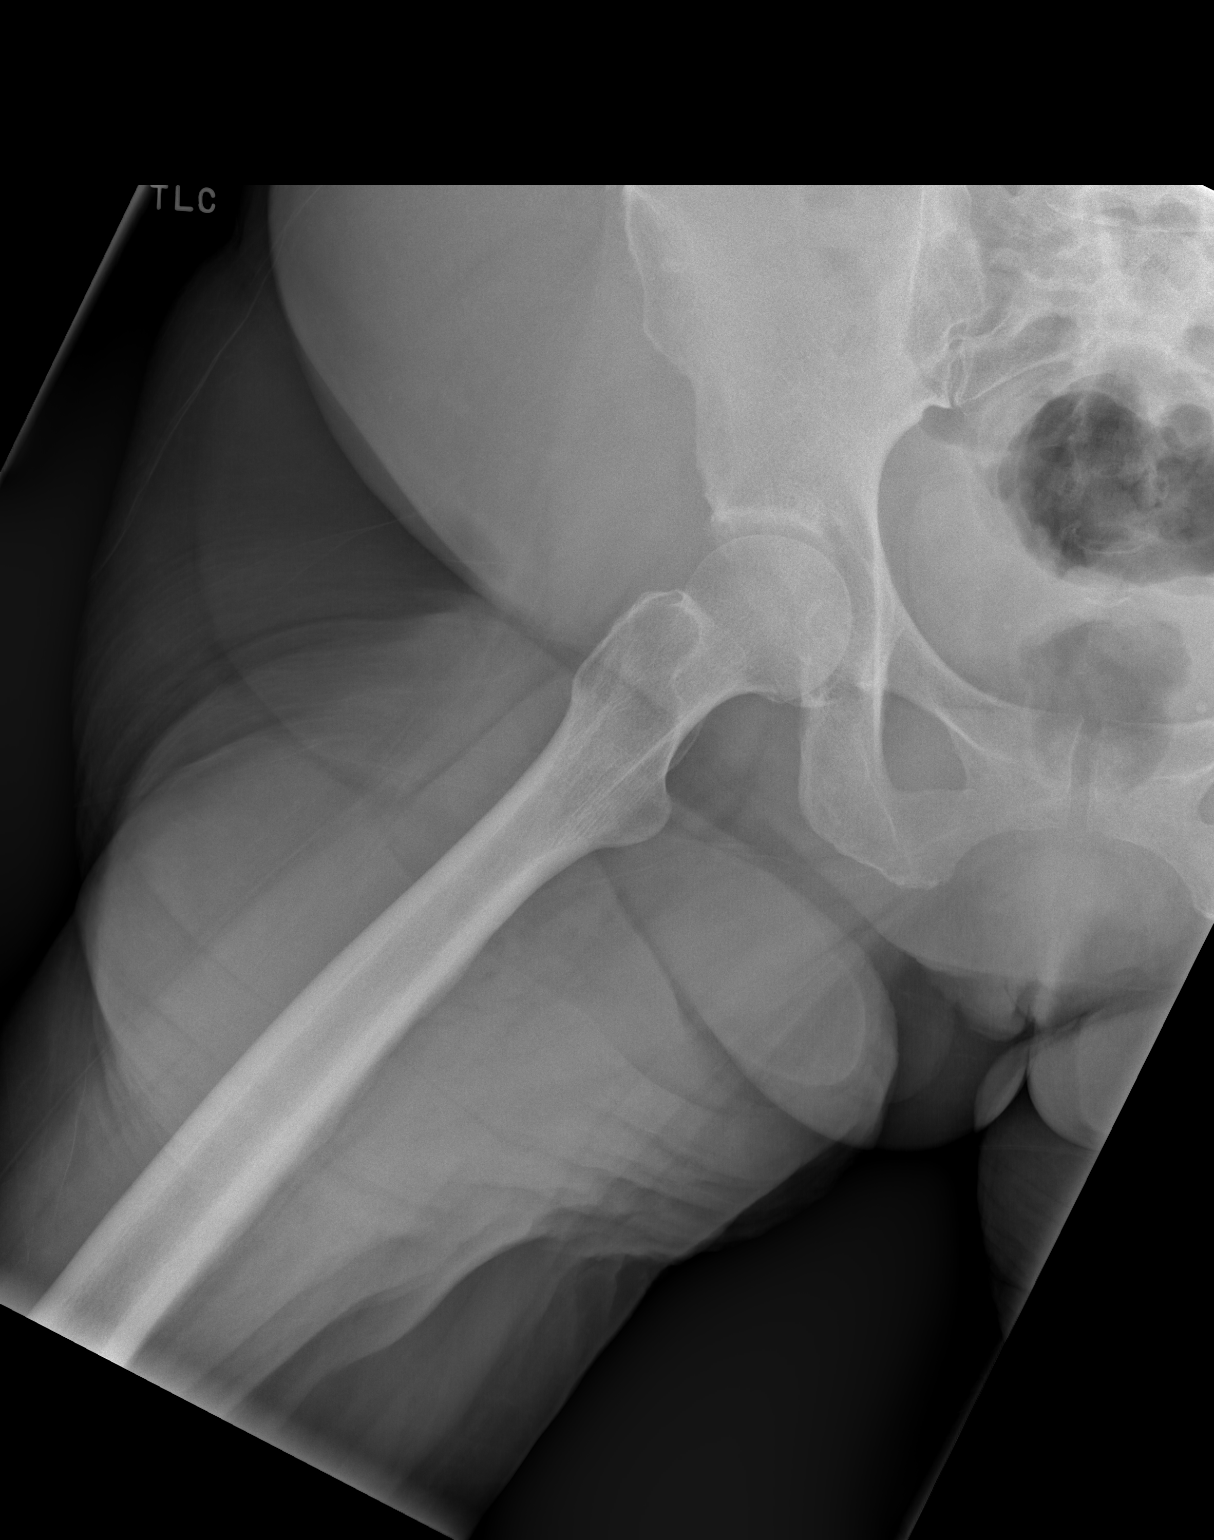

[3 of 3 positions shown; findings below may reference images not displayed]

FINDINGS: There is no evidence of hip fracture or dislocation. There is no
evidence of arthropathy or other focal bone abnormality.
IMPRESSION: Negative.

## 2019-08-10 ENCOUNTER — Ambulatory Visit
Admission: RE | Admit: 2019-08-10 | Discharge: 2019-08-10 | Disposition: A | Discharge status: Home / Self Care | Payer: Medicare Other | Source: Ambulatory Visit | Attending: Neurosurgery | Admitting: Neurosurgery

## 2019-08-10 ENCOUNTER — Other Ambulatory Visit: Payer: Self-pay

## 2019-08-10 DIAGNOSIS — M48061 Spinal stenosis, lumbar region without neurogenic claudication: Secondary | ICD-10-CM

## 2019-08-25 ENCOUNTER — Emergency Department (HOSPITAL_COMMUNITY): Payer: Medicare Other

## 2019-08-25 ENCOUNTER — Inpatient Hospital Stay (HOSPITAL_COMMUNITY)
Admission: EM | Admit: 2019-08-25 | Discharge: 2019-08-29 | DRG: 287 | Disposition: A | Discharge status: Home / Self Care | Payer: Medicare Other | Attending: Family Medicine | Admitting: Family Medicine

## 2019-08-25 DIAGNOSIS — M545 Low back pain: Secondary | ICD-10-CM | POA: Diagnosis present

## 2019-08-25 DIAGNOSIS — M797 Fibromyalgia: Secondary | ICD-10-CM | POA: Diagnosis present

## 2019-08-25 DIAGNOSIS — R079 Chest pain, unspecified: Secondary | ICD-10-CM | POA: Diagnosis not present

## 2019-08-25 DIAGNOSIS — G629 Polyneuropathy, unspecified: Secondary | ICD-10-CM | POA: Diagnosis present

## 2019-08-25 DIAGNOSIS — N179 Acute kidney failure, unspecified: Secondary | ICD-10-CM | POA: Diagnosis not present

## 2019-08-25 DIAGNOSIS — F329 Major depressive disorder, single episode, unspecified: Secondary | ICD-10-CM | POA: Diagnosis present

## 2019-08-25 DIAGNOSIS — Z79899 Other long term (current) drug therapy: Secondary | ICD-10-CM

## 2019-08-25 DIAGNOSIS — I249 Acute ischemic heart disease, unspecified: Secondary | ICD-10-CM | POA: Diagnosis not present

## 2019-08-25 DIAGNOSIS — Z8249 Family history of ischemic heart disease and other diseases of the circulatory system: Secondary | ICD-10-CM

## 2019-08-25 DIAGNOSIS — Z8673 Personal history of transient ischemic attack (TIA), and cerebral infarction without residual deficits: Secondary | ICD-10-CM

## 2019-08-25 DIAGNOSIS — D649 Anemia, unspecified: Secondary | ICD-10-CM

## 2019-08-25 DIAGNOSIS — E7800 Pure hypercholesterolemia, unspecified: Secondary | ICD-10-CM | POA: Diagnosis present

## 2019-08-25 DIAGNOSIS — I10 Essential (primary) hypertension: Secondary | ICD-10-CM | POA: Diagnosis present

## 2019-08-25 DIAGNOSIS — Z90710 Acquired absence of both cervix and uterus: Secondary | ICD-10-CM

## 2019-08-25 DIAGNOSIS — Z9884 Bariatric surgery status: Secondary | ICD-10-CM

## 2019-08-25 DIAGNOSIS — R0789 Other chest pain: Secondary | ICD-10-CM | POA: Diagnosis present

## 2019-08-25 DIAGNOSIS — Z79891 Long term (current) use of opiate analgesic: Secondary | ICD-10-CM

## 2019-08-25 DIAGNOSIS — E785 Hyperlipidemia, unspecified: Secondary | ICD-10-CM | POA: Diagnosis present

## 2019-08-25 DIAGNOSIS — Z20828 Contact with and (suspected) exposure to other viral communicable diseases: Secondary | ICD-10-CM | POA: Diagnosis present

## 2019-08-25 DIAGNOSIS — G4733 Obstructive sleep apnea (adult) (pediatric): Secondary | ICD-10-CM | POA: Diagnosis present

## 2019-08-25 DIAGNOSIS — K219 Gastro-esophageal reflux disease without esophagitis: Secondary | ICD-10-CM | POA: Diagnosis present

## 2019-08-25 DIAGNOSIS — Z7982 Long term (current) use of aspirin: Secondary | ICD-10-CM

## 2019-08-25 DIAGNOSIS — Z981 Arthrodesis status: Secondary | ICD-10-CM

## 2019-08-25 DIAGNOSIS — I959 Hypotension, unspecified: Secondary | ICD-10-CM

## 2019-08-25 DIAGNOSIS — Z833 Family history of diabetes mellitus: Secondary | ICD-10-CM

## 2019-08-25 DIAGNOSIS — G4730 Sleep apnea, unspecified: Secondary | ICD-10-CM | POA: Diagnosis present

## 2019-08-25 LAB — CBC
HCT: 30.8 % — ABNORMAL LOW (ref 36.0–46.0)
Hemoglobin: 9.7 g/dL — ABNORMAL LOW (ref 12.0–15.0)
MCH: 30.9 pg (ref 26.0–34.0)
MCHC: 31.5 g/dL (ref 30.0–36.0)
MCV: 98.1 fL (ref 80.0–100.0)
Platelets: 224 10*3/uL (ref 150–400)
RBC: 3.14 MIL/uL — ABNORMAL LOW (ref 3.87–5.11)
RDW: 15.3 % (ref 11.5–15.5)
WBC: 8.6 10*3/uL (ref 4.0–10.5)
nRBC: 0 % (ref 0.0–0.2)

## 2019-08-25 LAB — HEMOGLOBIN A1C
Hgb A1c MFr Bld: 4.2 % — ABNORMAL LOW (ref 4.8–5.6)
Mean Plasma Glucose: 73.84 mg/dL

## 2019-08-25 LAB — LIPID PANEL
Cholesterol: 101 mg/dL (ref 0–200)
HDL: 59 mg/dL (ref >40)
LDL Cholesterol: 32 mg/dL (ref 0–99)
Total CHOL/HDL Ratio: 1.7 RATIO
Triglycerides: 50 mg/dL (ref <150)
VLDL: 10 mg/dL (ref 0–40)

## 2019-08-25 LAB — BASIC METABOLIC PANEL
Anion gap: 7 (ref 5–15)
BUN: 18 mg/dL (ref 6–20)
CO2: 19 mmol/L — ABNORMAL LOW (ref 22–32)
Calcium: 8.8 mg/dL — ABNORMAL LOW (ref 8.9–10.3)
Chloride: 112 mmol/L — ABNORMAL HIGH (ref 98–111)
Creatinine, Ser: 1.45 mg/dL — ABNORMAL HIGH (ref 0.44–1.00)
GFR calc Af Amer: 46 mL/min — ABNORMAL LOW (ref >60)
GFR calc non Af Amer: 39 mL/min — ABNORMAL LOW (ref >60)
Glucose, Bld: 80 mg/dL (ref 70–99)
Potassium: 4.4 mmol/L (ref 3.5–5.1)
Sodium: 138 mmol/L (ref 135–145)

## 2019-08-25 LAB — TROPONIN I (HIGH SENSITIVITY)
Troponin I (High Sensitivity): 21 ng/L — ABNORMAL HIGH (ref <18)
Troponin I (High Sensitivity): 20 ng/L — ABNORMAL HIGH (ref <18)
Troponin I (High Sensitivity): 21 ng/L — ABNORMAL HIGH (ref <18)
Troponin I (High Sensitivity): 23 ng/L — ABNORMAL HIGH (ref <18)

## 2019-08-25 LAB — POC SARS CORONAVIRUS 2 AG -  ED: SARS Coronavirus 2 Ag: NEGATIVE

## 2019-08-25 LAB — I-STAT BETA HCG BLOOD, ED (MC, WL, AP ONLY): I-stat hCG, quantitative: <5 m[IU]/mL (ref <5)

## 2019-08-25 LAB — SARS CORONAVIRUS 2 (TAT 6-24 HRS): SARS Coronavirus 2: NEGATIVE

## 2019-08-25 LAB — POC OCCULT BLOOD, ED: Fecal Occult Bld: NEGATIVE

## 2019-08-25 MED ORDER — DULOXETINE HCL 60 MG PO CPEP
60.0000 mg | ORAL_CAPSULE | Freq: Every day | ORAL | Status: DC
  Administered 2019-08-26 – 2019-08-29 (×4): 60 mg via ORAL
  Filled 2019-08-25 (×4): qty 1

## 2019-08-25 MED ORDER — POLYETHYLENE GLYCOL 3350 17 G PO PACK: 17.0000 g | PACK | Freq: Every day | ORAL | Status: DC | PRN

## 2019-08-25 MED ORDER — FOLIC ACID 1 MG PO TABS
1.0000 mg | ORAL_TABLET | Freq: Every day | ORAL | Status: DC
  Administered 2019-08-26 – 2019-08-29 (×4): 1 mg via ORAL
  Filled 2019-08-25 (×4): qty 1

## 2019-08-25 MED ORDER — SODIUM CHLORIDE 0.9% FLUSH
3.0000 mL | Freq: Once | INTRAVENOUS | Status: DC
  Administered 2019-08-25: 3 mL via INTRAVENOUS

## 2019-08-25 MED ORDER — IOHEXOL 300 MG/ML  SOLN
100.0000 mL | Freq: Once | INTRAMUSCULAR | Status: AC | PRN
  Administered 2019-08-25: 70 mL via INTRAVENOUS

## 2019-08-25 MED ORDER — OXYCODONE HCL 5 MG PO TABS
5.0000 mg | ORAL_TABLET | Freq: Two times a day (BID) | ORAL | Status: DC | PRN
  Administered 2019-08-25 – 2019-08-27 (×3): 5 mg via ORAL
  Filled 2019-08-25 (×5): qty 1

## 2019-08-25 MED ORDER — AMILORIDE HCL 5 MG PO TABS: 5.0000 mg | ORAL_TABLET | Freq: Every day | ORAL | Status: DC

## 2019-08-25 MED ORDER — ENOXAPARIN SODIUM 40 MG/0.4ML ~~LOC~~ SOLN
40.0000 mg | SUBCUTANEOUS | Status: DC
  Administered 2019-08-25 – 2019-08-29 (×5): 40 mg via SUBCUTANEOUS
  Filled 2019-08-25 (×5): qty 0.4

## 2019-08-25 MED ORDER — SODIUM CHLORIDE 0.9 % IV BOLUS
500.0000 mL | Freq: Once | INTRAVENOUS | Status: AC
  Administered 2019-08-25: 500 mL via INTRAVENOUS

## 2019-08-25 MED ORDER — PANTOPRAZOLE SODIUM 40 MG PO TBEC
40.0000 mg | DELAYED_RELEASE_TABLET | Freq: Two times a day (BID) | ORAL | Status: DC
  Administered 2019-08-25 – 2019-08-29 (×8): 40 mg via ORAL
  Filled 2019-08-25: qty 1
  Filled 2019-08-25: qty 2
  Filled 2019-08-25: qty 1
  Filled 2019-08-25: qty 2
  Filled 2019-08-25: qty 1
  Filled 2019-08-25 (×2): qty 2
  Filled 2019-08-25: qty 1

## 2019-08-25 MED ORDER — ONDANSETRON HCL 4 MG/2ML IJ SOLN
4.0000 mg | Freq: Four times a day (QID) | INTRAMUSCULAR | Status: DC | PRN
  Administered 2019-08-28: 4 mg via INTRAVENOUS
  Filled 2019-08-25: qty 2

## 2019-08-25 MED ORDER — METOPROLOL SUCCINATE ER 100 MG PO TB24
100.0000 mg | ORAL_TABLET | Freq: Every day | ORAL | Status: DC
  Administered 2019-08-25 – 2019-08-28 (×4): 100 mg via ORAL
  Filled 2019-08-25 (×4): qty 1

## 2019-08-25 MED ORDER — ACETAMINOPHEN 325 MG PO TABS
650.0000 mg | ORAL_TABLET | ORAL | Status: DC | PRN
  Administered 2019-08-25 – 2019-08-29 (×3): 650 mg via ORAL
  Filled 2019-08-25 (×3): qty 2

## 2019-08-25 MED ORDER — AMLODIPINE BESYLATE 5 MG PO TABS: 5.0000 mg | ORAL_TABLET | Freq: Every day | ORAL | Status: DC

## 2019-08-25 MED ORDER — ASPIRIN EC 81 MG PO TBEC
81.0000 mg | DELAYED_RELEASE_TABLET | Freq: Every day | ORAL | Status: DC
  Administered 2019-08-26 – 2019-08-29 (×4): 81 mg via ORAL
  Filled 2019-08-25 (×5): qty 1

## 2019-08-25 MED ORDER — ASPIRIN 81 MG PO CHEW
243.0000 mg | CHEWABLE_TABLET | Freq: Once | ORAL | Status: AC
  Administered 2019-08-25: 243 mg via ORAL
  Filled 2019-08-25: qty 3

## 2019-08-25 MED ORDER — SODIUM CHLORIDE 0.9 % IV BOLUS
1000.0000 mL | Freq: Once | INTRAVENOUS | Status: AC
  Administered 2019-08-25: 1000 mL via INTRAVENOUS

## 2019-08-25 NOTE — Discharge Summary (Addendum)
I have reviewed the below documentation. I have seen the patient on the day of discharge and agree with the below exam.  In addition to below, I recommend the following at follow up:  Admission diagnosis: Atypical chest pain, likely MSK in nature  Repeat BMP at follow up   Jodi Bowman, [facility] Discharge Summary  Patient name: Jodi Bowman record number: [id number] Date of birth: [birth year] Age: 58 y.o. Gender: female Date of Admission: 08/25/2019  Date of Discharge: 08/29/2019 Admitting Physician: Martyn Malay, MD  Primary Care Provider: Aletha Halim., PA-C Consultants: Cardiology  Indication for Hospitalization: ACS rule out  Discharge Diagnoses/Problem List:  Atypical chest pain AKI H/o CVA with no residual deficits Normocytic anemia Hypertension Hyperlipidemia H/o gastric bypass (2018) Sleep apnea Reactive airway disease Fibromyalgia Chronic back pain Peripheral neuropathy GERD Seasonal allergies  Disposition: to home  Discharge Condition: stable and improved  Discharge Exam:  Physical Exam: General: 58 year old AA , no acute distress, very pleasant Cardiovascular: rrr, no m/r/g.  Respiratory: Lungs CTAB, no accessory muscle use MSK: moves 4 extremities equally. No tenderness to palpation in chest today Neuro: No focal neuro deficits Psych: Pleasant, coherent thought process, appropriate  Brief Hospital Course:  This 58 year old female presented with chest pain that started 2 days prior to admission.  She also noted shortness of breath on exertion.  EKG in the ED with normal sinus rhythm.  High sensitivity troponins were obtained and trended to be 21, 20, 21, 23.  CXR showed no acute events.  CTA chest with no acute findings, but noted, "several thyroid nodules, largest the posterior right lobe measuring 2.2 cm, unchanged from the prior CT."  Cardiology was  consulted in the ED given that patient is high risk and they recommended obtaining a repeat echo as well as risk ratification labs and a [employer].  Myoview showed inferolateral and apical area of reversible ischemia with perserved LV systolic function. Left heart cath and coronary angiography performed 11/30, which revealed mid LAD lesion 15% stenosed, proximal circumflex lesion 15% stenosed, normal end LV diastolic pressure, normal LVEF 55-65%. Cardiology recommended current course of medical management. Lipid panel was within normal limits and A1c was 4.2. PT and OT were consulted in order to evaluate for patient's recent falls, and they recommended no PT/OT follow up.   Issues for Follow Up:  1. Thyroid ultrasound for incidental finding on CT 2. Consider GI evaluation for anemia 3. Patient has been taking 40mg  Pantoprazole twice daily for GERD. We recommend the patient either cuts back to 40mg  Pantoprazole once daily OR discontinues Pantoprazole and instead takes Famotidine 20mg  daily.  4. HTN: pt was hypotensive during admission, ARB and potassium sparing diuretic held. Recommend close follow up regarding restarting HTN medications.  Significant Procedures: lexiscan myoview 08/27/19, left heart cath 08/29/19.  Significant Labs and Imaging:  Recent Labs  Lab 08/26/19 1357 08/28/19 1142 08/29/19 0521  WBC 6.3 7.8 7.9  HGB 8.9* 9.6* 9.4*  HCT 26.9* 28.8* 28.7*  PLT 228 234 234   Recent Labs  Lab 08/25/19 1113 08/26/19 0406 08/27/19 0505 08/28/19 0502 08/29/19 0521  NA 138 141 139 140 140  K 4.4 4.2 4.3 3.6 4.3  CL 112* 114* 108 108 107  CO2 19* 21* 24 26 28   GLUCOSE 80 77 81 75 79  BUN 18 13 12 13 16   CREATININE 1.45* 1.04* 0.89 0.85 0.92  CALCIUM 8.8* 8.4* 8.5* 8.4* 8.3*   Folate 816 Vitamin B12 308 Ferritin 231 Hemoglobin A1c 4.2 Lipid Panel     Component Value Date/Time   CHOL 101 08/25/2019 1730   TRIG 50 08/25/2019 1730   HDL 59 08/25/2019 1730   CHOLHDL  1.7 08/25/2019 1730   VLDL 10 08/25/2019 1730   LDLCALC 32 08/25/2019 1730   No results found. Results/Tests Pending at Time of Discharge: none Discharge Medications:  Allergies as of 08/29/2019      Reactions   Shrimp [shellfish Allergy] Anaphylaxis   Naproxen Other (See Comments)   Makes stomach cramp and burn badly      Medication List    STOP taking these medications   aMILoride 5 MG tablet Commonly known as: MIDAMOR   amLODipine 5 MG tablet Commonly known as: NORVASC   losartan 100 MG tablet Commonly known as: COZAAR     TAKE these medications   aspirin EC 81 MG tablet Take 81 mg by mouth daily.   DULoxetine 60 MG capsule Commonly known as: CYMBALTA Take 60 mg by mouth daily.   ferrous sulfate 325 (65 FE) MG EC tablet Take 325 mg by mouth daily.   fluticasone 50 MCG/ACT nasal spray Commonly known as: FLONASE Place 1 spray into both nostrils daily as needed for allergies.   folic acid 1 MG tablet Commonly known as: FOLVITE Take 1 mg by mouth daily.   Klor-Con M20 20 MEQ tablet Generic drug: potassium chloride SA Take 20 mEq by mouth 2 (two) times daily.   loratadine 10 MG tablet Commonly known as: CLARITIN Take 10 mg by mouth daily.   methocarbamol 500 MG tablet Commonly known as: ROBAXIN Take 1 tablet (500 mg total) by mouth every 6 (six) hours as needed for muscle spasms.   metoprolol succinate 100 MG 24 hr tablet Commonly known as: TOPROL-XL Take 100 mg by mouth at bedtime.   nitroGLYCERIN 0.4 MG SL tablet Commonly known as: NITROSTAT Place 1 tablet (0.4 mg total) under the tongue every 5 (five) minutes x 3 doses as needed for chest pain. What changed:   when to take this  reasons to take this   oxyCODONE 5 MG immediate release tablet Commonly known as: Oxy IR/ROXICODONE Take 1-2 tablets by mouth every 6 (six) hours as needed for pain.   pantoprazole 40 MG tablet Commonly known as: PROTONIX Take 1 tablet (40 mg total) by mouth 2  (two) times daily.   polyethylene glycol 17 g packet Commonly known as: MIRALAX / GLYCOLAX Take 17 g by mouth daily as needed for mild constipation.   pregabalin 75 MG capsule Commonly known as: Lyrica Take 1 capsule (75 mg total) by mouth 3 (three) times daily.   Proventil HFA 108 (90 Base) MCG/ACT inhaler Generic drug: albuterol Inhale 1-2 puffs into the lungs every 6 (six) hours as needed for wheezing or shortness of breath.   Vitamin D (Ergocalciferol) 1.25 MG (50000 UT) Caps capsule Commonly known as: DRISDOL Take 1 capsule (50,000 Units total) by mouth every Monday, Wednesday, and Friday.       Discharge Instructions: Please refer to Patient Instructions section of EMR for full details.  Patient was counseled important signs and symptoms that should prompt return to medical care, changes in medications, dietary instructions, activity restrictions, and follow up appointments.   Follow-Up Appointments: Follow-up Information    Schedule an appointment as soon as possible for a visit with Aletha Halim., PA-C.   Specialty: Family Medicine  Why: GO: Dec. 7th at 5:50pm Contact information: [place] [address] [phone]        Schedule an appointment as soon as possible for a visit with Charolette Forward, MD.   Specialty: Cardiology Why: GO: DEC. 8TH AT 3:30PM Contact information: [address] [place] [address] [phone]           Gladys Damme, MD 08/30/2019, 1:28 PM PGY-1, [place] -------------------------------------------------------------------------------------------------------------------------------- Upper Level Addendum: I have seen and evaluated this patient along with Dr. Chauncey Reading and reviewed the above note, making necessary revisions if needed  Guadalupe Dawn MD PGY-3 Family Medicine Resident

## 2019-08-25 NOTE — ED Triage Notes (Signed)
Pt arrives to ED with c/c of sob and cp for 2 days- pt states sob is worse with walking.

## 2019-08-25 NOTE — H&P (Addendum)
[facility] Admission History and Physical Service Pager: [phone]  Patient name: Jodi Bowman record number: [id number] Date of birth: 14-May-1961 Age: 58 y.o. Gender: female  Primary Care Provider: Aletha Halim., PA-C Consultants: Cardiology Code Status: Full code Preferred Emergency Contact: Isaac Bliss   Chief Complaint: Chest pain  Assessment and Plan: JOHNITA BEALS is a 58 y.o. female presenting with chest pain. PMH is significant for migraines, HTN, fibromyalgia, GERD, HLD,and  seasonal allergies.  Atypical Chest pain Chest pain started 2 days ago and was worse with exertion and with palpating her chest.  Had associated shortness of breath with it as well.  Pain radiated to the back but not radiate anywhere else.  Patient reports it is a pressure-like pain.  In the ED she had an EKG which showed normal sinus rhythm.  Troponins were trended 21> 20.  Chest x-ray showed no acute events.  Chest CT showed no acute findings.  She was evaluated by cardiology.  On exam patient was short of breath while trying to sit up complaining of pressure in her chest.  Pressure was reproducible with palpation across her sternum.  Differential includes costochondritis vs angina vs reflux vs pulmonary embolus vs aortic dissection. Wells Score 0. If patient becomes tachycardic will get more imaging to further rule out PE.  The latter 2 being unlikely given negative imaging studies. -Admit to inpatient teaching service with Dr. Owens Shark as the attending -Cardiology consulted, appreciate recommendations -Continue to trend troponin -Morning EKG -Lipid panel, HbA1c -Echo in AM 11/27 -Schedule for Lexiscan Myoview -Vitals per routine -PT/OT eval and treat -Continuous cardiac monitoring -Up with assistance -Zofran PRN N/V -Tylenol PRN Pain  AKI Patient's baseline creatinine is around 0.8.  On admission patient's creatinine was 1.45.  Patient was given a  liter of fluid in the ED.  Patient reports she has had increased urination over the past 2 weeks.  Patient also acknowledges taking ibuprofen for this chest pain.  Patient also takes daily low-dose aspirin. -Strict I's and O's monitoring urine output -Avoid nephrotoxic medications -Holding losartan, Amiloride, and Amlodipine at this time -Morning BMP   Anemia Patient's hemoglobin on admission was 9.7.  MVC is 98.1.  Patient's baseline hemoglobin over the past 6 months has ranged between 9 and 11.  On March 02, 2019 hemoglobin was 12.4.  Will assess for sources of blood loss. Patient has history of gastric bypass surgery (2018) and is taking ibuprofen for pain. Last colonoscopy 2014. -Avoid NSAIDs -FOBT -Monitor for worsening signs/symptoms of anemia  HTN BP on admission low at 80/50s, increased to 130/80s after 1L bolus NS. Patient's home meds include amlodipine 5 mg daily, losartan, amiloride 5 mg daily, metoprolol 100 mg daily. -Continue metoprolol 100 mg daily -Holding losartan, Amiloride and Amlodipine at this time due to AKI  Hyperlipidemia Patient reports she has history of hyperlipidemia and that she was prescribed medication at 1 point but that she no longer takes it. Questionable history of CVA in June 2011? -Lipid panel -Consider statin  Sleep apnea Patient has CPAP at home which she reports she does not use because she reports she had sleep apnea prior to her weight loss surgery but since that time has not used it. -CPAP in hospital  Reactive airway disease Patient has home albuterol for episodes of shortness of breath. -Continue home albuterol PRN  Fibromyalgia Patient is prescribed Cymbalta for fibromyalgia.  She also receives hydrocodone which she takes as needed.  Patient reports  she uses hydrocodone "maybe twice a week". -Continue Cymbalta 60mg   Peripheral neuropathy Patient is prescribed pregabalin with his peripheral numbness. -Continue home  pregabalin  GERD Patient takes pantoprazole 40 mg daily. -Continue home medications  Seasonal allergies Patient takes Flonase and Claritin daily for allergies. -Continue Flonase daily  FEN/GI: Miralax; Heart healthy, carb modified Prophylaxis: Lovenox  Disposition: Admit to med telemetry service  History of Present Illness:  Jodi Bowman is a 58 y.o. female presenting with chest pain.  Patient reports that her chest pain started 2 days ago and transmitted to her back.  She noticed at that time she was also having shortness of breath.  Especially with exertion.  Exertion also made her chest pain worse.  She would take ibuprofen with little to no relief in the pain.  Patient found nothing that made it better.  Patient describes the pain as pressure that waxes and wanes.  It does not shoot anywhere other than her back.  Patient also complains of headaches that started when the chest pain started and did not go away with ibuprofen.  It was a frontal headache that she thought was a migraine.  She reports she had the smell of burning breaks, sensitivity to light, nausea.  Patient also reports that she had loose stool that day which turned into watery stool for the rest the day.  On review of systems patient also reports she has had increased urination over the last 2 weeks after she started taking the medication prescribed to her by her primary care to help her urinate.  She reports she only took 3 doses of that but since then has been urinating more.   Review Of Systems: Per HPI with the following additions:  Review of Systems  Constitutional: Negative for fever and weight loss.  HENT: Positive for congestion.   Eyes: Positive for photophobia. Negative for blurred vision.  Respiratory: Positive for sputum production and shortness of breath. Negative for cough and wheezing.   Cardiovascular: Positive for chest pain, orthopnea and leg swelling.  Gastrointestinal: Positive for diarrhea and  nausea. Negative for abdominal pain, constipation and vomiting.  Genitourinary: Positive for frequency. Negative for dysuria and urgency.  Musculoskeletal: Positive for back pain.  Neurological: Positive for weakness and headaches. Negative for dizziness.    Patient Active Problem List   Diagnosis Date Noted  . Postoperative seroma involving nervous system after nervous system procedure 04/05/2019  . Drainage from wound 04/02/2019  . Infective otitis externa of left ear   . Left otitis media   . AKI (acute kidney injury) ([place])   . Urinary retention   . Orthostasis   . Acute blood loss anemia   . Fibromyalgia   . Chronic pain syndrome   . Lumbar radiculopathy 03/08/2019  . Orthostatic hypotension 03/06/2019  . Postoperative urinary retention 03/06/2019  . Lumbar foraminal stenosis 03/04/2019  . S/P exploratory laparotomy 03/06/2018  . Bowel obstruction ([place]) 03/06/2018  . Chest pain, rule out acute myocardial infarction 12/17/2017  . Hypokalemia 12/17/2017  . Acute lower UTI 12/17/2017  . Vertigo 04/30/2017  . Small vessel disease, cerebrovascular 04/30/2017  . Chronic low back pain 08/01/2015  . Right hip pain 08/01/2015  . Abnormality of gait 08/01/2015  . Left leg weakness   . Low back pain with radiation   . Syncope 07/30/2014  . Atypical chest pain 03/12/2014  . Lap chole [facility] April 2013 01/29/2012  . Gallstones 12/04/2011  . Thyroid nodule-non neoplastic goiter by needle aspiration 12/04/2011  .  GLUCOSE INTOLERANCE 03/12/2010  . DYSLIPIDEMIA 03/12/2010  . Chronic migraine 03/12/2010  . Carotid stenosis 03/12/2010  . CEREBROVASCULAR ACCIDENT 03/12/2010  . LIPOMA 01/29/2010  . HEADACHE 01/29/2010  . ANKLE INJURY, RIGHT 04/12/2009  . PHARYNGITIS 03/21/2009  . Backache 03/01/2009  . ALLERGIC RHINITIS 03/17/2007  . LOW BACK PAIN 03/17/2007  . Essential hypertension 01/01/2007  . ANXIETY STATE NOS 09/11/2005    Past Medical History: Past Medical History:   Diagnosis Date  . Arthritis   . Calculus of gallbladder without mention of cholecystitis or obstruction   . Dizziness   . Fibromyalgia   . GERD (gastroesophageal reflux disease)   . Goiter   . Hypercholesteremia   . Hypertension   . Lower back pain   . Migraine   . Nontoxic uninodular goiter    sees dr vollmer at [facility]  . Obesity   . PONV (postoperative nausea and vomiting)   . Sleep apnea    STOPBANG=5  . Stroke [facility]) 2000    Past Surgical History: Past Surgical History:  Procedure Laterality Date  . ABDOMINAL HYSTERECTOMY    . BOWEL RESECTION N/A 03/06/2018   Procedure: SMALL BOWEL ANASTAMOSIS;  Surgeon: Clovis Riley, MD;  Location: [place];  Service: General;  Laterality: N/A;  . CATARACT EXTRACTION W/ INTRAOCULAR LENS IMPLANT Bilateral   . CESAREAN SECTION  yrs ago   done x 2  . CHOLECYSTECTOMY  01/05/2012   Procedure: LAPAROSCOPIC CHOLECYSTECTOMY WITH INTRAOPERATIVE CHOLANGIOGRAM;  Surgeon: Pedro Earls, MD;  Location: [facility];  Service: General;  Laterality: N/A;  . COLONOSCOPY  10/08/2012   Procedure: COLONOSCOPY;  Surgeon: Beryle Beams, MD;  Location: [facility];  Service: Endoscopy;  Laterality: N/A;  . KNEE ARTHROSCOPY  one 1995 and 1 in 1997   both knees done  . LAPAROSCOPY N/A 03/06/2018   Procedure: LAPAROSCOPY DIAGNOSTIC WITH LYSIS OF ADHESIONS;  Surgeon: Clovis Riley, MD;  Location: [place];  Service: General;  Laterality: N/A;  . LAPAROTOMY N/A 03/06/2018   Procedure: EXPLORATORY LAPAROTOMY, RESECTION OF DISTAL ROUX, CLOSURE OF INTERNAL HERNIA;  Surgeon: Clovis Riley, MD;  Location: [place];  Service: General;  Laterality: N/A;  . LUMBAR WOUND DEBRIDEMENT N/A 04/03/2019   Procedure: LUMBAR WOUND WASHOUT;  Surgeon: Judith Part, MD;  Location: [place];  Service: Neurosurgery;  Laterality: N/A;  . POSTERIOR LUMBAR FUSION  03/04/2019  . surgery for endometriosis  yrs ago  . thryoid biopsy  December 01, 2011    at [facility]    Social History: Social  History   Tobacco Use  . Smoking status: Never Smoker  . Smokeless tobacco: Never Used  Substance Use Topics  . Alcohol use: Not Currently    Alcohol/week: 0.0 standard drinks  . Drug use: No   Please also refer to relevant sections of EMR.  Family History: Family History  Problem Relation Age of Onset  . Diabetes Mother   . Hypertension Mother   . Transient ischemic attack Mother   . Seizures Mother   . Dementia Mother   . Other Father        MVA  . Cancer Brother        colon and lung  . Cancer Maternal Grandmother        colon     Allergies and Medications: Allergies  Allergen Reactions  . Shrimp [Shellfish Allergy] Anaphylaxis  . Naproxen Other (See Comments)    Makes stomach cramp and burn badly   No current facility-administered  medications on file prior to encounter.    Current Outpatient Medications on File Prior to Encounter  Medication Sig Dispense Refill  . acetaminophen (TYLENOL) 325 MG tablet Take 2 tablets (650 mg total) by mouth every 4 (four) hours as needed for mild pain ((score 1 to 3) or temp > 100.5).    Marland Kitchen albuterol (PROVENTIL HFA) 108 (90 Base) MCG/ACT inhaler Inhale 1-2 puffs into the lungs every 6 (six) hours as needed for wheezing or shortness of breath.    Marland Kitchen aMILoride (MIDAMOR) 5 MG tablet Take 1 tablet (5 mg total) by mouth daily. 30 tablet 0  . amLODipine (NORVASC) 5 MG tablet Take 1 tablet (5 mg total) by mouth daily. 30 tablet 1  . aspirin EC 81 MG tablet Take 81 mg by mouth daily.    . DULoxetine (CYMBALTA) 30 MG capsule Take 1 capsule (30 mg total) by mouth daily. 30 capsule 3  . fluticasone (FLONASE) 50 MCG/ACT nasal spray Place 1 spray into both nostrils daily as needed for allergies.   0  . folic acid (FOLVITE) 1 MG tablet Take 1 mg by mouth daily.  11  . HYDROcodone-acetaminophen (NORCO) 10-325 MG tablet Take 1 tablet by mouth every 4 (four) hours as needed for moderate pain ((score 4 to 6)). 30 tablet 0  . ibuprofen (ADVIL) 400 MG  tablet Take 1 tablet (400 mg total) by mouth every 8 (eight) hours as needed for moderate pain. 20 tablet 0  . loratadine (CLARITIN) 10 MG tablet Take 10 mg by mouth daily.     Marland Kitchen losartan (COZAAR) 100 MG tablet Take 1 tablet (100 mg total) by mouth daily. 30 tablet 0  . methocarbamol (ROBAXIN) 500 MG tablet Take 1 tablet (500 mg total) by mouth every 6 (six) hours as needed for muscle spasms. 60 tablet 0  . metoprolol succinate (TOPROL-XL) 50 MG 24 hr tablet Take 1 tablet (50 mg total) by mouth daily. Take with or immediately following a meal. (Patient taking differently: Take 50 mg by mouth at bedtime. Take with or immediately following a meal.) 30 tablet 0  . nitroGLYCERIN (NITROSTAT) 0.4 MG SL tablet Place 1 tablet (0.4 mg total) under the tongue every 5 (five) minutes x 3 doses as needed for chest pain. (Patient taking differently: Place 0.4 mg under the tongue every 5 (five) minutes as needed for chest pain (MAXIMUM OF 3 DOSES). ) 25 tablet 12  . pantoprazole (PROTONIX) 40 MG tablet Take 1 tablet (40 mg total) by mouth 2 (two) times daily. 60 tablet 0  . polyethylene glycol (MIRALAX / GLYCOLAX) 17 g packet Take 17 g by mouth daily as needed for mild constipation. (Patient not taking: Reported on 04/02/2019) 14 each 0  . pregabalin (LYRICA) 75 MG capsule Take 1 capsule (75 mg total) by mouth 3 (three) times daily. 90 capsule 0  . Vitamin D, Ergocalciferol, (DRISDOL) 1.25 MG (50000 UT) CAPS capsule Take 1 capsule (50,000 Units total) by mouth every Monday, Wednesday, and Friday. 5 capsule 0  . [DISCONTINUED] metoprolol (LOPRESSOR) 50 MG tablet Take 50 mg by mouth 2 (two) times daily.      . [DISCONTINUED] simvastatin (ZOCOR) 20 MG tablet Take 20 mg by mouth at bedtime.         Objective: BP 114/69   Pulse 87   Temp 97.8 F (36.6 C) (Oral)   Resp 13   SpO2 100%  Physical Exam  Constitutional: She is oriented to person, place, and time and well-developed, well-nourished,  and in no distress. No  distress.  HENT:  Head: Normocephalic and atraumatic.  Eyes: Pupils are equal, round, and reactive to light. EOM are normal. Right eye exhibits no discharge. Left eye exhibits no discharge.  Neck: Normal range of motion. Neck supple.  Cardiovascular: Normal rate, regular rhythm, normal heart sounds and intact distal pulses. Exam reveals no gallop and no friction rub.  No murmur heard. Pulmonary/Chest: Effort normal and breath sounds normal. She has no wheezes. She exhibits tenderness (When palpating sternum.  Patient reports the pain radiates to her back).  Patient had increased work of breathing while trying to sit up  Abdominal: Soft. Bowel sounds are normal. She exhibits no distension. There is no abdominal tenderness.  Musculoskeletal: Normal range of motion.        General: Edema (Trace edema in lower extremities) present.  Neurological: She is alert and oriented to person, place, and time. No cranial nerve deficit.  Skin: Skin is warm and dry. She is not diaphoretic. No erythema.  Psychiatric: Affect normal.     Labs and Imaging: CBC BMET  Recent Labs  Lab 08/25/19 1113  WBC 8.6  HGB 9.7*  HCT 30.8*  PLT 224   Recent Labs  Lab 08/25/19 1113  NA 138  K 4.4  CL 112*  CO2 19*  BUN 18  CREATININE 1.45*  GLUCOSE 80  CALCIUM 8.8*    Troponins- 21>20   EKG: Normal sinus rhythm  Dg Chest Port 1 View  Result Date: 08/25/2019 CLINICAL DATA:  Chest pain, shortness of breath EXAM: PORTABLE CHEST 1 VIEW COMPARISON:  CTA chest dated 10/06/2018 FINDINGS: Lungs are clear.  No pleural effusion or pneumothorax. The heart is normal in size. IMPRESSION: No evidence of acute cardiopulmonary disease. Electronically Signed   By: Julian Hy M.D.   On: 08/25/2019 12:36   Ct Angio Chest/abd/pel For Dissection W And/or W/wo  Result Date: 08/25/2019 CLINICAL DATA:  Mid back pain for 2 days.  Hypotension. EXAM: CTA CHEST, ABDOMEN, AND PELVIS WITHOUT AND WITH CONTRAST TECHNIQUE:  Multidetector CT imaging of the chest chest was performed prior to intravenous contrast. CT imaging was then acquired during the intravenous infusion contrast per standard, through the chest, abdomen and pelvis. Computer generated coronal MPR and MIP reconstructed images acquired. CONTRAST:  41mL OMNIPAQUE IOHEXOL 300 MG/ML  SOLN COMPARISON:  CT, 10/06/2018. FINDINGS: CT CHEST FINDINGS Cardiovascular: Aorta is normal in caliber. No dissection. No atherosclerosis. Pulmonary arteries relatively well opacified. There is no evidence a central embolus. Pulmonary arteries are normal in caliber. Heart is normal in size and configuration. No pericardial effusion. Mediastinum/Nodes: Several thyroid nodules, largest the posterior right lobe measuring 2.2 cm, unchanged from the prior CT. No neck base, axillary, mediastinal masses or enlarged lymph nodes. Trachea and esophagus unremarkable. Lungs/Pleura: Minor dependent linear atelectasis. Lungs otherwise clear. No pleural effusion or pneumothorax. Musculoskeletal: Mild degenerative changes of the thoracic spine. No fracture or acute finding. No bone lesion. No chest wall mass. CT ABDOMEN PELVIS FINDINGS CTA findings Aorta: Normal in caliber.  No dissection.  No atherosclerosis. Celiac axis: Widely patent. SMA: Widely patent. Renal arteries: Widely patent. IMA: Widely patent. Iliac vessels: Common, external and internal iliac arteries are widely patent. Non CTA findings Hepatobiliary: Liver normal in size. No mass focal lesion. Status post cholecystectomy. No bile duct dilation. Pancreas: Unremarkable. No pancreatic ductal dilatation or surrounding inflammatory changes. Spleen: Normal in size without focal abnormality. Adrenals/Urinary Tract: No adrenal masses. Kidneys normal in overall size orientation and  position. Low-density renal masses are noted consistent cysts, stable prior CT. No hydronephrosis. Normal ureters. Normal bladder. Stomach/Bowel: Stable changes from prior  gastric bypass surgery. No stomach wall thickening or inflammation. Small bowel is normal in caliber. No wall thickening or inflammation. Mild gaseous distention of the colon. No colonic wall thickening. No inflammation. Normal appendix visualized. Lymphatic: No enlarged lymph nodes. Reproductive: Status post hysterectomy. No adnexal masses. Other: Midline anterior abdominal wall scar is stable. No hernia. No ascites. Musculoskeletal: L4 through S1 posterior lumbar spine fusion. This is new since the prior CT. Orthopedic hardware appears well seated and well-positioned. No fracture or acute finding.  No bone lesion. IMPRESSION: CTA FINDINGS 1. Normal caliber thoracoabdominal aorta. No dissection. No atherosclerosis. 2. Aortic branch vessels are widely patent.  No stenosis. CHEST CT 1. No acute findings. 2. 2.2 cm right thyroid nodule, present on the previous CT. This is stable from older CTs as well. As noted on the prior CT, recommend follow-up thyroid ultrasound if this has not been previously performed. ABDOMEN AND PELVIS CT 1. No acute findings. 2. Stable changes prior gastric bypass surgery, cholecystectomy and hysterectomy. 3. New changes a posterior L4 through S1 lumbar spine fusion. Electronically Signed   By: Lajean Manes M.D.   On: 08/25/2019 13:22   Gifford Shave, MD 08/25/2019, 2:28 PM PGY-1, [place] Intern pager: [phone], text pages welcome   FPTS Upper-Level Resident Addendum I have independently interviewed and examined the patient. I have discussed the above with the original author and agree with their documentation. My edits for correction/addition/clarification are in blue. Please see also any attending notes.    Milus Banister, DO PGY-2, [facility] 08/25/2019 7:24 PM  [place] Service pager: [phone] (text pages welcome through [place])

## 2019-08-25 NOTE — Progress Notes (Addendum)
FMTS Brief Note Patient seen and examined at 1430 today. 58 year old with history of CVA, overweight status, HTN, and dyslipidemia presenting with atypical chest pain. Chest pain started 2 days ago upon awakening, associated dyspnea. Relieved by rest but worsened by pressure with digits and exertion. No palpitations, UE pain, edema, orthopnea, or PND. No recent travel. No cough, fevers, or COVID contacts.   PMH HTN HLD Overweight status CVA, known vascular disease GERD  PSH Gastric bypass (suspect cause of anemia)  SHx Lives alone, non-smoker, recent stressors of grandchild (almost a year) and COVID  Vitals, labs reviewed, mild elevated in troponin. AKI, chronic normocytic anemia. EKG sinus rhythm.  CTA reviewed.   HEENT: Sclera anicteric. e. Appears well hydrated. Neck: Supple Cardiac: Regular rate and rhythm. Normal S1/S2. No murmurs, rubs, or gallops appreciated. Lungs: Clear bilaterally to ascultation.  Abdomen: Normoactive bowel sounds.Suprapubic tenderness.  Extremities: Warm, well perfused without edema.  Psych: Pleasant and appropriate   Atypical chest pain, appreciate Cardiolgoy recommendations. Seems more MSK. She has been much more active with PT as has had had back pain. No GERD symptoms. No pneumonia on imaging.  Will consider d-Dimer, Geneva score is 0 but Well's score is 3.  Given risk factors and mild troponin elevation, will admit and proceed with plan as detailed by resident physicians with echocardiogram, lipid profile, A1C, troponin levels, and stress imaging.   Frequent falls, likely due to known spinal stenosis, s/p surgery, consult PT/OT. Will obtain advanced imaging pending evaluation.   Will attest full note as available.   [name], MD  Family Medicine Teaching Service

## 2019-08-25 NOTE — ED Provider Notes (Signed)
[facility] DEPARTMENT Provider Note   CSN: [id number] Arrival date & time: 08/25/19  1054     History   Chief Complaint Chief Complaint  Patient presents with  . Shortness of Breath    HPI Jodi Bowman is a 58 y.o. female.     58yo female with complaint of chest pain x 2 days. Pain was intermittent yesterday, constant today. Pain is midsternal, radiates to back, worse with taking a deep breath. Denies cough, congestion. Reports feeling SHOB today when walking around her house. Currently rates chest pain as "moderate, 7/10." Patient took 2 Tylenol at 9:45AM. Patient has nitro, could not find it, did not take it. Pain was more intense today than prior chest pain episodes.  Also reports feeling weak and lightheaded.  Patient reports bright red blood in her stools at times, states she did notice scant bright red blood in her stool today. History of HTN and high cholesterol. Non smoker.  Also reports 4 falls in the past 2 weeks due to legs giving out on her. Last fall was 2 days ago, was walking to the kitchen and legs gave out, no injuries from this fall.     Past Medical History:  Diagnosis Date  . Arthritis   . Calculus of gallbladder without mention of cholecystitis or obstruction   . Dizziness   . Fibromyalgia   . GERD (gastroesophageal reflux disease)   . Goiter   . Hypercholesteremia   . Hypertension   . Lower back pain   . Migraine   . Nontoxic uninodular goiter    sees dr vollmer at [facility]  . Obesity   . PONV (postoperative nausea and vomiting)   . Sleep apnea    STOPBANG=5  . Stroke [facility]) 2000    Patient Active Problem List   Diagnosis Date Noted  . ACS (acute coronary syndrome) ([place]) 08/25/2019  . Postoperative seroma involving nervous system after nervous system procedure 04/05/2019  . Drainage from wound 04/02/2019  . Infective otitis externa of left ear   . Left otitis media   . AKI (acute kidney injury) ([place])   .  Urinary retention   . Orthostasis   . Acute blood loss anemia   . Fibromyalgia   . Chronic pain syndrome   . Lumbar radiculopathy 03/08/2019  . Orthostatic hypotension 03/06/2019  . Postoperative urinary retention 03/06/2019  . Lumbar foraminal stenosis 03/04/2019  . S/P exploratory laparotomy 03/06/2018  . Bowel obstruction ([place]) 03/06/2018  . Chest pain, rule out acute myocardial infarction 12/17/2017  . Hypokalemia 12/17/2017  . Acute lower UTI 12/17/2017  . Vertigo 04/30/2017  . Small vessel disease, cerebrovascular 04/30/2017  . Chronic low back pain 08/01/2015  . Right hip pain 08/01/2015  . Abnormality of gait 08/01/2015  . Left leg weakness   . Low back pain with radiation   . Syncope 07/30/2014  . Atypical chest pain 03/12/2014  . Lap chole [facility] April 2013 01/29/2012  . Gallstones 12/04/2011  . Thyroid nodule-non neoplastic goiter by needle aspiration 12/04/2011  . GLUCOSE INTOLERANCE 03/12/2010  . DYSLIPIDEMIA 03/12/2010  . Chronic migraine 03/12/2010  . Carotid stenosis 03/12/2010  . CEREBROVASCULAR ACCIDENT 03/12/2010  . LIPOMA 01/29/2010  . HEADACHE 01/29/2010  . ANKLE INJURY, RIGHT 04/12/2009  . PHARYNGITIS 03/21/2009  . Backache 03/01/2009  . ALLERGIC RHINITIS 03/17/2007  . LOW BACK PAIN 03/17/2007  . Essential hypertension 01/01/2007  . ANXIETY STATE NOS 09/11/2005    Past Surgical History:  Procedure Laterality  Date  . ABDOMINAL HYSTERECTOMY    . BOWEL RESECTION N/A 03/06/2018   Procedure: SMALL BOWEL ANASTAMOSIS;  Surgeon: Clovis Riley, MD;  Location: [facility];  Service: General;  Laterality: N/A;  . CATARACT EXTRACTION W/ INTRAOCULAR LENS IMPLANT Bilateral   . CESAREAN SECTION  yrs ago   done x 2  . CHOLECYSTECTOMY  01/05/2012   Procedure: LAPAROSCOPIC CHOLECYSTECTOMY WITH INTRAOPERATIVE CHOLANGIOGRAM;  Surgeon: Pedro Earls, MD;  Location: [facility];  Service: General;  Laterality: N/A;  . COLONOSCOPY  10/08/2012   Procedure: COLONOSCOPY;   Surgeon: Beryle Beams, MD;  Location: [facility];  Service: Endoscopy;  Laterality: N/A;  . KNEE ARTHROSCOPY  one 1995 and 1 in 1997   both knees done  . LAPAROSCOPY N/A 03/06/2018   Procedure: LAPAROSCOPY DIAGNOSTIC WITH LYSIS OF ADHESIONS;  Surgeon: Clovis Riley, MD;  Location: [place];  Service: General;  Laterality: N/A;  . LAPAROTOMY N/A 03/06/2018   Procedure: EXPLORATORY LAPAROTOMY, RESECTION OF DISTAL ROUX, CLOSURE OF INTERNAL HERNIA;  Surgeon: Clovis Riley, MD;  Location: [place];  Service: General;  Laterality: N/A;  . LUMBAR WOUND DEBRIDEMENT N/A 04/03/2019   Procedure: LUMBAR WOUND WASHOUT;  Surgeon: Judith Part, MD;  Location: [place];  Service: Neurosurgery;  Laterality: N/A;  . POSTERIOR LUMBAR FUSION  03/04/2019  . surgery for endometriosis  yrs ago  . thryoid biopsy  December 01, 2011    at [facility]     OB History   No obstetric history on file.      Home Medications    Prior to Admission medications   Medication Sig Start Date End Date Taking? Authorizing Provider  acetaminophen (TYLENOL) 325 MG tablet Take 2 tablets (650 mg total) by mouth every 4 (four) hours as needed for mild pain ((score 1 to 3) or temp > 100.5). 03/15/19  Yes Angiulli, Lavon Paganini, PA-C  albuterol (PROVENTIL HFA) 108 (90 Base) MCG/ACT inhaler Inhale 1-2 puffs into the lungs every 6 (six) hours as needed for wheezing or shortness of breath.   Yes [provider]  aMILoride (MIDAMOR) 5 MG tablet Take 1 tablet (5 mg total) by mouth daily. 03/15/19  Yes Angiulli, Lavon Paganini, PA-C  amLODipine (NORVASC) 5 MG tablet Take 1 tablet (5 mg total) by mouth daily. 03/15/19  Yes Angiulli, Lavon Paganini, PA-C  aspirin EC 81 MG tablet Take 81 mg by mouth daily.   Yes [provider]  DULoxetine (CYMBALTA) 60 MG capsule Take 60 mg by mouth daily. 08/11/19  Yes [provider]  ferrous sulfate 325 (65 FE) MG EC tablet Take 325 mg by mouth daily. 07/26/19  Yes [provider]  fluticasone  (FLONASE) 50 MCG/ACT nasal spray Place 1 spray into both nostrils daily as needed for allergies.  11/08/17  Yes [provider]  folic acid (FOLVITE) 1 MG tablet Take 1 mg by mouth daily. 10/14/17  Yes [provider]  KLOR-CON M20 20 MEQ tablet Take 20 mEq by mouth 2 (two) times daily. 07/20/19  Yes [provider]  loratadine (CLARITIN) 10 MG tablet Take 10 mg by mouth daily.    Yes [provider]  losartan (COZAAR) 100 MG tablet Take 1 tablet (100 mg total) by mouth daily. 03/15/19  Yes Angiulli, Lavon Paganini, PA-C  methocarbamol (ROBAXIN) 500 MG tablet Take 1 tablet (500 mg total) by mouth every 6 (six) hours as needed for muscle spasms. 03/15/19  Yes Angiulli, Lavon Paganini, PA-C  metoprolol succinate (TOPROL-XL)  100 MG 24 hr tablet Take 100 mg by mouth at bedtime.  07/10/19  Yes [provider]  nitroGLYCERIN (NITROSTAT) 0.4 MG SL tablet Place 1 tablet (0.4 mg total) under the tongue every 5 (five) minutes x 3 doses as needed for chest pain. Patient taking differently: Place 0.4 mg under the tongue every 5 (five) minutes as needed for chest pain (MAXIMUM OF 3 DOSES).  03/13/14  Yes Charolette Forward, MD  oxyCODONE (OXY IR/ROXICODONE) 5 MG immediate release tablet Take 1-2 tablets by mouth every 6 (six) hours as needed for pain. 08/12/19  Yes [provider]  pantoprazole (PROTONIX) 40 MG tablet Take 1 tablet (40 mg total) by mouth 2 (two) times daily. 03/15/19  Yes Angiulli, Lavon Paganini, PA-C  polyethylene glycol (MIRALAX / GLYCOLAX) 17 g packet Take 17 g by mouth daily as needed for mild constipation. 03/08/19  Yes Viona Gilmore D, NP  pregabalin (LYRICA) 75 MG capsule Take 1 capsule (75 mg total) by mouth 3 (three) times daily. 03/15/19  Yes Angiulli, Lavon Paganini, PA-C  Vitamin D, Ergocalciferol, (DRISDOL) 1.25 MG (50000 UT) CAPS capsule Take 1 capsule (50,000 Units total) by mouth every Monday, Wednesday, and Friday. 03/16/19  Yes Angiulli, Lavon Paganini, PA-C   metoprolol (LOPRESSOR) 50 MG tablet Take 50 mg by mouth 2 (two) times daily.    12/04/11  [provider]  simvastatin (ZOCOR) 20 MG tablet Take 20 mg by mouth at bedtime.    12/04/11  [provider]    Family History Family History  Problem Relation Age of Onset  . Diabetes Mother   . Hypertension Mother   . Transient ischemic attack Mother   . Seizures Mother   . Dementia Mother   . Other Father        MVA  . Cancer Brother        colon and lung  . Cancer Maternal Grandmother        colon    Social History Social History   Tobacco Use  . Smoking status: Never Smoker  . Smokeless tobacco: Never Used  Substance Use Topics  . Alcohol use: Not Currently    Alcohol/week: 0.0 standard drinks  . Drug use: No     Allergies   Shrimp [shellfish allergy] and Naproxen   Review of Systems Review of Systems  Constitutional: Negative for fever.  Respiratory: Positive for shortness of breath.   Cardiovascular: Positive for chest pain.  Gastrointestinal: Positive for blood in stool. Negative for abdominal pain, constipation, diarrhea, nausea and vomiting.  Musculoskeletal: Positive for back pain.  Skin: Negative for rash and wound.  Allergic/Immunologic: Negative for immunocompromised state.  Neurological: Positive for weakness and light-headedness. Negative for syncope.  Psychiatric/Behavioral: Negative for confusion.  All other systems reviewed and are negative.    Physical Exam Updated Vital Signs BP 114/68   Pulse 87   Temp 97.8 F (36.6 C) (Oral)   Resp 14   SpO2 100%   Physical Exam Vitals signs and nursing note reviewed.  Constitutional:      General: She is not in acute distress.    Appearance: She is well-developed. She is not diaphoretic.  HENT:     Head: Normocephalic and atraumatic.  Cardiovascular:     Rate and Rhythm: Normal rate and regular rhythm.  Pulmonary:     Effort: Pulmonary effort is normal.     Breath sounds: Normal  breath sounds. No decreased breath sounds.  Chest:     Chest wall:  Tenderness present.    Abdominal:     Palpations: Abdomen is soft.     Tenderness: There is abdominal tenderness in the left upper quadrant and left lower quadrant.     Comments: Mild left side abdominal tenderness   Musculoskeletal:     Thoracic back: She exhibits tenderness.       Back:     Right lower leg: No edema.     Left lower leg: No edema.  Skin:    General: Skin is warm and dry.     Findings: No rash.  Neurological:     Mental Status: She is alert and oriented to person, place, and time.  Psychiatric:        Behavior: Behavior normal.      ED Treatments / Results  Labs (all labs ordered are listed, but only abnormal results are displayed) Labs Reviewed  BASIC METABOLIC PANEL - Abnormal; Notable for the following components:      Result Value   Chloride 112 (*)    CO2 19 (*)    Creatinine, Ser 1.45 (*)    Calcium 8.8 (*)    GFR calc non Af Amer 39 (*)    GFR calc Af Amer 46 (*)    All other components within normal limits  CBC - Abnormal; Notable for the following components:   RBC 3.14 (*)    Hemoglobin 9.7 (*)    HCT 30.8 (*)    All other components within normal limits  TROPONIN I (HIGH SENSITIVITY) - Abnormal; Notable for the following components:   Troponin I (High Sensitivity) 21 (*)    All other components within normal limits  TROPONIN I (HIGH SENSITIVITY) - Abnormal; Notable for the following components:   Troponin I (High Sensitivity) 20 (*)    All other components within normal limits  SARS CORONAVIRUS 2 (TAT 6-24 HRS)  I-STAT BETA HCG BLOOD, ED ([facility], [facility], [facility] ONLY)  POC OCCULT BLOOD, ED  POC SARS CORONAVIRUS 2 AG -  ED    EKG EKG Interpretation  Date/Time:  Thursday August 25 2019 11:01:00 EST Ventricular Rate:  86 PR Interval:  142 QRS Duration: 70 QT Interval:  360 QTC Calculation: 430 R Axis:   37 Text Interpretation: Poor data quality, interpretation may be  adversely affected Normal sinus rhythm Normal ECG Confirmed by Pattricia Boss [phone]) on 08/25/2019 12:26:54 PM   Radiology Dg Chest Port 1 View  Result Date: 08/25/2019 CLINICAL DATA:  Chest pain, shortness of breath EXAM: PORTABLE CHEST 1 VIEW COMPARISON:  CTA chest dated 10/06/2018 FINDINGS: Lungs are clear.  No pleural effusion or pneumothorax. The heart is normal in size. IMPRESSION: No evidence of acute cardiopulmonary disease. Electronically Signed   By: Julian Hy M.D.   On: 08/25/2019 12:36   Ct Angio Chest/abd/pel For Dissection W And/or W/wo  Result Date: 08/25/2019 CLINICAL DATA:  Mid back pain for 2 days.  Hypotension. EXAM: CTA CHEST, ABDOMEN, AND PELVIS WITHOUT AND WITH CONTRAST TECHNIQUE: Multidetector CT imaging of the chest chest was performed prior to intravenous contrast. CT imaging was then acquired during the intravenous infusion contrast per standard, through the chest, abdomen and pelvis. Computer generated coronal MPR and MIP reconstructed images acquired. CONTRAST:  43mL OMNIPAQUE IOHEXOL 300 MG/ML  SOLN COMPARISON:  CT, 10/06/2018. FINDINGS: CT CHEST FINDINGS Cardiovascular: Aorta is normal in caliber. No dissection. No atherosclerosis. Pulmonary arteries relatively well opacified. There is no evidence a central embolus. Pulmonary arteries are normal in caliber. Heart is normal  in size and configuration. No pericardial effusion. Mediastinum/Nodes: Several thyroid nodules, largest the posterior right lobe measuring 2.2 cm, unchanged from the prior CT. No neck base, axillary, mediastinal masses or enlarged lymph nodes. Trachea and esophagus unremarkable. Lungs/Pleura: Minor dependent linear atelectasis. Lungs otherwise clear. No pleural effusion or pneumothorax. Musculoskeletal: Mild degenerative changes of the thoracic spine. No fracture or acute finding. No bone lesion. No chest wall mass. CT ABDOMEN PELVIS FINDINGS CTA findings Aorta: Normal in caliber.  No dissection.   No atherosclerosis. Celiac axis: Widely patent. SMA: Widely patent. Renal arteries: Widely patent. IMA: Widely patent. Iliac vessels: Common, external and internal iliac arteries are widely patent. Non CTA findings Hepatobiliary: Liver normal in size. No mass focal lesion. Status post cholecystectomy. No bile duct dilation. Pancreas: Unremarkable. No pancreatic ductal dilatation or surrounding inflammatory changes. Spleen: Normal in size without focal abnormality. Adrenals/Urinary Tract: No adrenal masses. Kidneys normal in overall size orientation and position. Low-density renal masses are noted consistent cysts, stable prior CT. No hydronephrosis. Normal ureters. Normal bladder. 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 No stomach wall thickening or inflammation. Small bowel is normal in caliber. No wall thickening or inflammation. Mild gaseous distention of the colon. No colonic wall thickening. No inflammation. Normal appendix visualized. Lymphatic: No enlarged lymph nodes. Reproductive: Status post hysterectomy. No adnexal masses. Other: Midline anterior abdominal wall scar is stable. No hernia. No ascites. Musculoskeletal: L4 through S1 posterior lumbar spine fusion. This is new since the prior CT. Orthopedic hardware appears well seated and well-positioned. No fracture or acute finding.  No bone lesion. IMPRESSION: CTA FINDINGS 1. Normal caliber thoracoabdominal aorta. No dissection. No atherosclerosis. 2. Aortic branch vessels are widely patent.  No stenosis. CHEST CT 1. No acute findings. 2. 2.2 cm right thyroid nodule, present on the previous CT. This is stable from older CTs as well. As noted on the prior CT, recommend follow-up thyroid ultrasound if this has not been previously performed. ABDOMEN AND PELVIS CT 1. No acute findings. 2. Stable changes prior gastric bypass surgery, cholecystectomy and hysterectomy. 3. New changes a posterior L4 through S1 lumbar spine fusion.  Electronically Signed   By: Lajean Manes M.D.   On: 08/25/2019 13:22    Procedures Procedures (including critical care time)  Medications Ordered in ED Medications  sodium chloride flush (NS) 0.9 % injection 3 mL (has no administration in time range)  sodium chloride 0.9 % bolus 1,000 mL (1,000 mLs Intravenous New Bag/Given 08/25/19 1236)  aspirin chewable tablet 243 mg (243 mg Oral Given 08/25/19 1335)  iohexol (OMNIPAQUE) 300 MG/ML solution 100 mL (70 mLs Intravenous Contrast Given 08/25/19 1248)     Initial Impression / Assessment and Plan / ED Course  I have reviewed the triage vital signs and the nursing notes.  Pertinent labs & imaging results that were available during my care of the patient were reviewed by me and considered in my medical decision making (see chart for details).  Clinical Course as of Aug 25 1531  Thu Aug 25, 2019  1418 57yo female presents with complaint of chest pain, radiates to back, worse than her usual chest pain, no relief with Tylenol at home, did not take her Nitro at home. On arrival, patient was hypotensive, BP improved with IV fluid bolus and proper fitting BP cuff. Patient takes 81mg  ASA at home daily, given additional 243mg  ASA in the ER for ongoing CP.  CTA negative for dissection/PE, CBC with anemia with hgb 9.7, slight decrease  from previous 10.9. BMP with sight increase in Cr. Initial troponin elevated at 21, repeat is 20.  Case discussed with Dr. Jeanell Sparrow, ER attending, agrees with plan of care to consult for admission for CP with elevated troponin, EKG without ischemic changes.    [LM]  1455 Case discussed with hospitalist who will consult, requests consult to cardiology. Call to Dr. Sharolyn Douglas, patient sees Dr. Terrence Dupont, requested page to Dr. Terrence Dupont.   [LM]  [phone] Care signed out awaiting return call from Dr. Terrence Dupont, hospitalist has admitted patient.    [LM]    Clinical Course User Index [LM] Tacy Learn, PA-C       Final Clinical  Impressions(s) / ED Diagnoses   Final diagnoses:  Chest pain, unspecified type  Hypotension, unspecified hypotension type    ED Discharge Orders    None       Tacy Learn, PA-C 08/25/19 1533    Pattricia Boss, MD 08/25/19 1547

## 2019-08-25 NOTE — Consult Note (Signed)
Reason for Consult: Chest pain with minimally elevated troponin I Referring Physician: Family medicine  Jodi Bowman is an 58 y.o. female.  HPI: Patient is 58 year old female with past medical history significant for hypertension, hyperlipidemia, history of obstructive sleep apnea, morbid obesity, status post gastric bypass surgery in the past, history of CVA in the past, fibromyalgia, GERD, anemia of chronic disease, degenerative joint disease, came to ER complaining of retrosternal chest pain described as pressure rated 10/10 radiating occasionally to left shoulder associated with shortness of breath.  States chest pain increases occasionally with deep breathing and movement improved after taking ibuprofen.  States lately he also gets short of breath with minimal exertion walking from living room to the dining room.  Denies PND orthopnea or leg swelling denies palpitation lightheadedness or syncope.  EKG done in the ED showed normal sinus rhythm with nonspecific T wave changes high-sensitivity troponin was minimally elevated.  Patient denies any anginal chest pain.  Patient had nuclear stress test in the past in March 2019 which showed no evidence of reversible ischemia.  Past Medical History:  Diagnosis Date  . Arthritis   . Calculus of gallbladder without mention of cholecystitis or obstruction   . Dizziness   . Fibromyalgia   . GERD (gastroesophageal reflux disease)   . Goiter   . Hypercholesteremia   . Hypertension   . Lower back pain   . Migraine   . Nontoxic uninodular goiter    sees dr vollmer at [facility]  . Obesity   . PONV (postoperative nausea and vomiting)   . Sleep apnea    STOPBANG=5  . Stroke [facility]) 2000    Past Surgical History:  Procedure Laterality Date  . ABDOMINAL HYSTERECTOMY    . BOWEL RESECTION N/A 03/06/2018   Procedure: SMALL BOWEL ANASTAMOSIS;  Surgeon: Clovis Riley, MD;  Location: [place];  Service: General;  Laterality: N/A;  . CATARACT EXTRACTION  W/ INTRAOCULAR LENS IMPLANT Bilateral   . CESAREAN SECTION  yrs ago   done x 2  . CHOLECYSTECTOMY  01/05/2012   Procedure: LAPAROSCOPIC CHOLECYSTECTOMY WITH INTRAOPERATIVE CHOLANGIOGRAM;  Surgeon: Pedro Earls, MD;  Location: [facility];  Service: General;  Laterality: N/A;  . COLONOSCOPY  10/08/2012   Procedure: COLONOSCOPY;  Surgeon: Beryle Beams, MD;  Location: [facility];  Service: Endoscopy;  Laterality: N/A;  . KNEE ARTHROSCOPY  one 1995 and 1 in 1997   both knees done  . LAPAROSCOPY N/A 03/06/2018   Procedure: LAPAROSCOPY DIAGNOSTIC WITH LYSIS OF ADHESIONS;  Surgeon: Clovis Riley, MD;  Location: [place];  Service: General;  Laterality: N/A;  . LAPAROTOMY N/A 03/06/2018   Procedure: EXPLORATORY LAPAROTOMY, RESECTION OF DISTAL ROUX, CLOSURE OF INTERNAL HERNIA;  Surgeon: Clovis Riley, MD;  Location: [place];  Service: General;  Laterality: N/A;  . LUMBAR WOUND DEBRIDEMENT N/A 04/03/2019   Procedure: LUMBAR WOUND WASHOUT;  Surgeon: Judith Part, MD;  Location: [place] [place] [facility];  Service: Neurosurgery;  Laterality: N/A;  . POSTERIOR LUMBAR FUSION  03/04/2019  . surgery for endometriosis  yrs ago  . thryoid biopsy  December 01, 2011    at [facility]    Family History  Problem Relation Age of Onset  . Diabetes Mother   . Hypertension Mother   . Transient ischemic attack Mother   . Seizures Mother   . Dementia Mother   . Other Father        MVA  . Cancer Brother  colon and lung  . Cancer Maternal Grandmother        colon    Social History:  reports that she has never smoked. She has never used smokeless tobacco. She reports previous alcohol use. She reports that she does not use drugs.  Allergies:  Allergies  Allergen Reactions  . Shrimp [Shellfish Allergy] Anaphylaxis  . Naproxen Other (See Comments)    Makes stomach cramp and burn badly    Medications: I have reviewed the patient's current medications.  Results for orders placed or performed during the hospital encounter of  08/25/19 (from the past 48 hour(s))  Basic metabolic panel     Status: Abnormal   Collection Time: 08/25/19 11:13 AM  Result Value Ref Range   Sodium 138 135 - 145 mmol/L   Potassium 4.4 3.5 - 5.1 mmol/L   Chloride 112 (H) 98 - 111 mmol/L   CO2 19 (L) 22 - 32 mmol/L   Glucose, Bld 80 70 - 99 mg/dL   BUN 18 6 - 20 mg/dL   Creatinine, Ser 1.45 (H) 0.44 - 1.00 mg/dL   Calcium 8.8 (L) 8.9 - 10.3 mg/dL   GFR calc non Af Amer 39 (L) >60 mL/min   GFR calc Af Amer 46 (L) >60 mL/min   Anion gap 7 5 - 15    Comment: Performed at [facility], [address], [place], [place] [address]  CBC     Status: Abnormal   Collection Time: 08/25/19 11:13 AM  Result Value Ref Range   WBC 8.6 4.0 - 10.5 K/uL   RBC 3.14 (L) 3.87 - 5.11 MIL/uL   Hemoglobin 9.7 (L) 12.0 - 15.0 g/dL   HCT 30.8 (L) 36.0 - 46.0 %   MCV 98.1 80.0 - 100.0 fL   MCH 30.9 26.0 - 34.0 pg   MCHC 31.5 30.0 - 36.0 g/dL   RDW 15.3 11.5 - 15.5 %   Platelets 224 150 - 400 K/uL   nRBC 0.0 0.0 - 0.2 %    Comment: Performed at [facility], [place] [address], [place], [place] [address]  Troponin I (High Sensitivity)     Status: Abnormal   Collection Time: 08/25/19 11:13 AM  Result Value Ref Range   Troponin I (High Sensitivity) 21 (H) <18 ng/L    Comment: (NOTE) Elevated high sensitivity troponin I (hsTnI) values and significant  changes across serial measurements may suggest ACS but many other  chronic and acute conditions are known to elevate hsTnI results.  Refer to the "Links" section for chest pain algorithms and additional  guidance. Performed at [facility], [place] [address], [place], [place] [address]   I-Stat beta hCG blood, ED     Status: None   Collection Time: 08/25/19 11:34 AM  Result Value Ref Range   I-stat hCG, quantitative <5.0 <5 mIU/mL   Comment 3            Comment:   GEST. AGE      CONC.  (mIU/mL)   <=1 WEEK        5 - 50     2 WEEKS       50 - 500     3 WEEKS       100 - 10,000     4 WEEKS      1,000 - 30,000        FEMALE AND NON-PREGNANT FEMALE:     LESS THAN 5 mIU/mL   Troponin  I (High Sensitivity)     Status: Abnormal   Collection Time: 08/25/19  1:07 PM  Result Value Ref Range   Troponin I (High Sensitivity) 20 (H) <18 ng/L    Comment: (NOTE) Elevated high sensitivity troponin I (hsTnI) values and significant  changes across serial measurements may suggest ACS but many other  chronic and acute conditions are known to elevate hsTnI results.  Refer to the "Links" section for chest pain algorithms and additional  guidance. Performed at [facility], [place] [address], [place], [place] [address]   POC SARS Coronavirus 2 Ag-ED -     Status: None   Collection Time: 08/25/19  2:31 PM  Result Value Ref Range   SARS Coronavirus 2 Ag NEGATIVE NEGATIVE    Comment: (NOTE) SARS-CoV-2 antigen NOT DETECTED.  Negative results are presumptive.  Negative results do not preclude SARS-CoV-2 infection and should not be used as the sole basis for treatment or other patient management decisions, including infection  control decisions, particularly in the presence of clinical signs and  symptoms consistent with COVID-19, or in those who have been in contact with the virus.  Negative results must be combined with clinical observations, patient history, and epidemiological information. The expected result is Negative. Fact Sheet for Patients: PodPark.tn Fact Sheet for Healthcare Providers: GiftContent.is This test is not yet approved or cleared by the [place] FDA and  has been authorized for detection and/or diagnosis of SARS-CoV-2 by FDA under an Emergency Use Authorization (EUA).  This EUA will remain in effect (meaning this test can be used) for the duration of  the COVID-19 de claration under Section 564(b)(1) of the Act, 21 U.S.C. section 360bbb-3(b)(1), unless the authorization is terminated or revoked sooner.    POC occult blood, ED RN will collect     Status: None   Collection Time: 08/25/19  3:56 PM  Result Value Ref Range   Fecal Occult Bld NEGATIVE NEGATIVE    Dg Chest Port 1 View  Result Date: 08/25/2019 CLINICAL DATA:  Chest pain, shortness of breath EXAM: PORTABLE CHEST 1 VIEW COMPARISON:  CTA chest dated 10/06/2018 FINDINGS: Lungs are clear.  No pleural effusion or pneumothorax. The heart is normal in size. IMPRESSION: No evidence of acute cardiopulmonary disease. Electronically Signed   By: Julian Hy M.D.   On: 08/25/2019 12:36   Ct Angio Chest/abd/pel For Dissection W And/or W/wo  Result Date: 08/25/2019 CLINICAL DATA:  Mid back pain for 2 days.  Hypotension. EXAM: CTA CHEST, ABDOMEN, AND PELVIS WITHOUT AND WITH CONTRAST TECHNIQUE: Multidetector CT imaging of the chest chest was performed prior to intravenous contrast. CT imaging was then acquired during the intravenous infusion contrast per standard, through the chest, abdomen and pelvis. Computer generated coronal MPR and MIP reconstructed images acquired. CONTRAST:  12mL OMNIPAQUE IOHEXOL 300 MG/ML  SOLN COMPARISON:  CT, 10/06/2018. FINDINGS: CT CHEST FINDINGS Cardiovascular: Aorta is normal in caliber. No dissection. No atherosclerosis. Pulmonary arteries relatively well opacified. There is no evidence a central embolus. Pulmonary arteries are normal in caliber. Heart is normal in size and configuration. No pericardial effusion. Mediastinum/Nodes: Several thyroid nodules, largest the posterior right lobe measuring 2.2 cm, unchanged from the prior CT. No neck base, axillary, mediastinal masses or enlarged lymph nodes. Trachea and esophagus unremarkable. Lungs/Pleura: Minor dependent linear atelectasis. Lungs otherwise clear. No pleural effusion or pneumothorax. Musculoskeletal: Mild degenerative changes of the thoracic spine. No fracture or acute finding. No bone lesion. No chest wall mass. CT ABDOMEN  PELVIS FINDINGS CTA findings Aorta:  Normal in caliber.  No dissection.  No atherosclerosis. Celiac axis: Widely patent. SMA: Widely patent. Renal arteries: Widely patent. IMA: Widely patent. Iliac vessels: Common, external and internal iliac arteries are widely patent. Non CTA findings Hepatobiliary: Liver normal in size. No mass focal lesion. Status post cholecystectomy. No bile duct dilation. Pancreas: Unremarkable. No pancreatic ductal dilatation or surrounding inflammatory changes. Spleen: Normal in size without focal abnormality. Adrenals/Urinary Tract: No adrenal masses. Kidneys normal in overall size orientation and position. Low-density renal masses are noted consistent cysts, stable prior CT. No hydronephrosis. Normal ureters. Normal bladder. 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 No stomach wall thickening or inflammation. Small bowel is normal in caliber. No wall thickening or inflammation. Mild gaseous distention of the colon. No colonic wall thickening. No inflammation. Normal appendix visualized. Lymphatic: No enlarged lymph nodes. Reproductive: Status post hysterectomy. No adnexal masses. Other: Midline anterior abdominal wall scar is stable. No hernia. No ascites. Musculoskeletal: L4 through S1 posterior lumbar spine fusion. This is new since the prior CT. Orthopedic hardware appears well seated and well-positioned. No fracture or acute finding.  No bone lesion. IMPRESSION: CTA FINDINGS 1. Normal caliber thoracoabdominal aorta. No dissection. No atherosclerosis. 2. Aortic branch vessels are widely patent.  No stenosis. CHEST CT 1. No acute findings. 2. 2.2 cm right thyroid nodule, present on the previous CT. This is stable from older CTs as well. As noted on the prior CT, recommend follow-up thyroid ultrasound if this has not been previously performed. ABDOMEN AND PELVIS CT 1. No acute findings. 2. Stable changes prior gastric bypass surgery, cholecystectomy and hysterectomy. 3. New changes a posterior L4  through S1 lumbar spine fusion. Electronically Signed   By: Lajean Manes M.D.   On: 08/25/2019 13:22    Review of Systems  Constitutional: Negative for chills and fever.  HENT: Negative for hearing loss.   Eyes: Negative for blurred vision.  Respiratory: Positive for shortness of breath. Negative for cough.   Cardiovascular: Positive for chest pain. Negative for palpitations, orthopnea, claudication and leg swelling.  Gastrointestinal: Negative for abdominal pain, nausea and vomiting.  Genitourinary: Negative for dysuria.  Skin: Negative for rash.  Neurological: Negative for dizziness.   Blood pressure 137/84, pulse 77, temperature 98 F (36.7 C), temperature source Oral, resp. rate 18, height 4\' 11"  (1.499 m), weight 66.6 kg, SpO2 100 %. Physical Exam  Constitutional: She is oriented to person, place, and time.  HENT:  Head: Normocephalic and atraumatic.  Eyes: Pupils are equal, round, and reactive to light. Conjunctivae are normal. Left eye exhibits no discharge. No scleral icterus.  Neck: Normal range of motion. Neck supple. No JVD present. No tracheal deviation present. No thyromegaly present.  Cardiovascular: Normal rate and regular rhythm. Exam reveals no gallop.  No murmur heard. Respiratory: Effort normal and breath sounds normal. No respiratory distress. She has no wheezes. She has no rales.  GI: Soft. Bowel sounds are normal. She exhibits no distension. There is no abdominal tenderness. There is no rebound.  Musculoskeletal:        General: Deformity present. No tenderness or edema.  Neurological: She is alert and oriented to person, place, and time.    Assessment/Plan: Atypical chest pain with minimally elevated high-sensitivity troponin I doubt significant MI Hypertension Hyperlipidemia History of morbid obesity status post gastric bypass surgery History of obstructive sleep apnea Fibromyalgia History of CVA in remote past Depression Anemia of chronic disease rule  out GI loss  Acute kidney injury probably secondary to nonsteroidal anti-inflammatory meds. Depression GERD Degenerative joint disease status post lumbar fusion in the past Plan Agree with present management with aspirin beta-blockers and calcium channel blockers. Check serial enzymes and EKG and lipid panel Check stool for occult blood Avoid NSAIDs Scheduled for 2D echo check LV systolic function and wall motion abnormalities Schedule for [facility] Charolette Forward 08/25/2019, 4:46 PM

## 2019-08-25 NOTE — Plan of Care (Signed)

## 2019-08-25 NOTE — ED Notes (Signed)
hospitalist at bedside

## 2019-08-25 NOTE — Progress Notes (Signed)
Pt refusing cpap for the night. RT will continue to monitor as needed. 

## 2019-08-26 ENCOUNTER — Other Ambulatory Visit: Payer: Self-pay

## 2019-08-26 ENCOUNTER — Encounter (HOSPITAL_COMMUNITY): Payer: Self-pay

## 2019-08-26 ENCOUNTER — Inpatient Hospital Stay (HOSPITAL_COMMUNITY): Payer: Medicare Other

## 2019-08-26 DIAGNOSIS — R079 Chest pain, unspecified: Secondary | ICD-10-CM | POA: Diagnosis not present

## 2019-08-26 DIAGNOSIS — G8929 Other chronic pain: Secondary | ICD-10-CM | POA: Diagnosis not present

## 2019-08-26 DIAGNOSIS — Z79899 Other long term (current) drug therapy: Secondary | ICD-10-CM | POA: Diagnosis not present

## 2019-08-26 DIAGNOSIS — Z981 Arthrodesis status: Secondary | ICD-10-CM | POA: Diagnosis not present

## 2019-08-26 DIAGNOSIS — M545 Low back pain: Secondary | ICD-10-CM

## 2019-08-26 DIAGNOSIS — R0789 Other chest pain: Secondary | ICD-10-CM | POA: Diagnosis present

## 2019-08-26 DIAGNOSIS — N179 Acute kidney failure, unspecified: Secondary | ICD-10-CM | POA: Diagnosis present

## 2019-08-26 DIAGNOSIS — Z7982 Long term (current) use of aspirin: Secondary | ICD-10-CM | POA: Diagnosis not present

## 2019-08-26 DIAGNOSIS — Z90710 Acquired absence of both cervix and uterus: Secondary | ICD-10-CM | POA: Diagnosis not present

## 2019-08-26 DIAGNOSIS — Z833 Family history of diabetes mellitus: Secondary | ICD-10-CM | POA: Diagnosis not present

## 2019-08-26 DIAGNOSIS — G4730 Sleep apnea, unspecified: Secondary | ICD-10-CM | POA: Diagnosis present

## 2019-08-26 DIAGNOSIS — Z20828 Contact with and (suspected) exposure to other viral communicable diseases: Secondary | ICD-10-CM | POA: Diagnosis present

## 2019-08-26 DIAGNOSIS — I10 Essential (primary) hypertension: Secondary | ICD-10-CM | POA: Diagnosis present

## 2019-08-26 DIAGNOSIS — Z8673 Personal history of transient ischemic attack (TIA), and cerebral infarction without residual deficits: Secondary | ICD-10-CM | POA: Diagnosis not present

## 2019-08-26 DIAGNOSIS — I959 Hypotension, unspecified: Secondary | ICD-10-CM | POA: Diagnosis not present

## 2019-08-26 DIAGNOSIS — G629 Polyneuropathy, unspecified: Secondary | ICD-10-CM | POA: Diagnosis present

## 2019-08-26 DIAGNOSIS — E7800 Pure hypercholesterolemia, unspecified: Secondary | ICD-10-CM | POA: Diagnosis present

## 2019-08-26 DIAGNOSIS — E785 Hyperlipidemia, unspecified: Secondary | ICD-10-CM | POA: Diagnosis present

## 2019-08-26 DIAGNOSIS — I249 Acute ischemic heart disease, unspecified: Secondary | ICD-10-CM | POA: Diagnosis present

## 2019-08-26 DIAGNOSIS — G4733 Obstructive sleep apnea (adult) (pediatric): Secondary | ICD-10-CM | POA: Diagnosis present

## 2019-08-26 DIAGNOSIS — Z79891 Long term (current) use of opiate analgesic: Secondary | ICD-10-CM | POA: Diagnosis not present

## 2019-08-26 DIAGNOSIS — F329 Major depressive disorder, single episode, unspecified: Secondary | ICD-10-CM | POA: Diagnosis present

## 2019-08-26 DIAGNOSIS — M797 Fibromyalgia: Secondary | ICD-10-CM | POA: Diagnosis present

## 2019-08-26 DIAGNOSIS — K219 Gastro-esophageal reflux disease without esophagitis: Secondary | ICD-10-CM | POA: Diagnosis present

## 2019-08-26 DIAGNOSIS — Z8249 Family history of ischemic heart disease and other diseases of the circulatory system: Secondary | ICD-10-CM | POA: Diagnosis not present

## 2019-08-26 DIAGNOSIS — Z9884 Bariatric surgery status: Secondary | ICD-10-CM | POA: Diagnosis not present

## 2019-08-26 LAB — FERRITIN: Ferritin: 231 ng/mL (ref 11–307)

## 2019-08-26 LAB — CBC
HCT: 25.6 % — ABNORMAL LOW (ref 36.0–46.0)
HCT: 26.9 % — ABNORMAL LOW (ref 36.0–46.0)
Hemoglobin: 8.2 g/dL — ABNORMAL LOW (ref 12.0–15.0)
Hemoglobin: 8.9 g/dL — ABNORMAL LOW (ref 12.0–15.0)
MCH: 30.1 pg (ref 26.0–34.0)
MCH: 31 pg (ref 26.0–34.0)
MCHC: 32 g/dL (ref 30.0–36.0)
MCHC: 33.1 g/dL (ref 30.0–36.0)
MCV: 94.1 fL (ref 80.0–100.0)
MCV: 93.7 fL (ref 80.0–100.0)
Platelets: 212 10*3/uL (ref 150–400)
Platelets: 228 10*3/uL (ref 150–400)
RBC: 2.72 MIL/uL — ABNORMAL LOW (ref 3.87–5.11)
RBC: 2.87 MIL/uL — ABNORMAL LOW (ref 3.87–5.11)
RDW: 15 % (ref 11.5–15.5)
RDW: 14.9 % (ref 11.5–15.5)
WBC: 6.4 10*3/uL (ref 4.0–10.5)
WBC: 6.3 10*3/uL (ref 4.0–10.5)
nRBC: 0 % (ref 0.0–0.2)
nRBC: 0 % (ref 0.0–0.2)

## 2019-08-26 LAB — BASIC METABOLIC PANEL
Anion gap: 6 (ref 5–15)
BUN: 13 mg/dL (ref 6–20)
CO2: 21 mmol/L — ABNORMAL LOW (ref 22–32)
Calcium: 8.4 mg/dL — ABNORMAL LOW (ref 8.9–10.3)
Chloride: 114 mmol/L — ABNORMAL HIGH (ref 98–111)
Creatinine, Ser: 1.04 mg/dL — ABNORMAL HIGH (ref 0.44–1.00)
GFR calc Af Amer: >60 mL/min (ref >60)
GFR calc non Af Amer: 59 mL/min — ABNORMAL LOW (ref >60)
Glucose, Bld: 77 mg/dL (ref 70–99)
Potassium: 4.2 mmol/L (ref 3.5–5.1)
Sodium: 141 mmol/L (ref 135–145)

## 2019-08-26 LAB — VITAMIN B12: Vitamin B-12: 308 pg/mL (ref 180–914)

## 2019-08-26 MED ORDER — LIDOCAINE 5 % EX PTCH
1.0000 | MEDICATED_PATCH | CUTANEOUS | Status: DC
  Administered 2019-08-26 – 2019-08-29 (×4): 1 via TRANSDERMAL
  Filled 2019-08-26 (×4): qty 1

## 2019-08-26 MED ORDER — ALUM & MAG HYDROXIDE-SIMETH 200-200-20 MG/5ML PO SUSP
30.0000 mL | Freq: Once | ORAL | Status: AC
  Administered 2019-08-26: 30 mL via ORAL
  Filled 2019-08-26: qty 30

## 2019-08-26 MED ORDER — MORPHINE SULFATE (PF) 2 MG/ML IV SOLN
2.0000 mg | Freq: Once | INTRAVENOUS | Status: AC
  Administered 2019-08-26: 2 mg via INTRAVENOUS
  Filled 2019-08-26: qty 1

## 2019-08-26 MED ORDER — GLUCAGON HCL RDNA (DIAGNOSTIC) 1 MG IJ SOLR
INTRAMUSCULAR | Status: AC
  Filled 2019-08-26: qty 1

## 2019-08-26 MED ORDER — IBUPROFEN 600 MG PO TABS
600.0000 mg | ORAL_TABLET | Freq: Once | ORAL | Status: AC
  Administered 2019-08-26: 600 mg via ORAL
  Filled 2019-08-26: qty 1

## 2019-08-26 NOTE — Evaluation (Signed)
Physical Therapy Evaluation Patient Details Name: Jodi Bowman MRN: [id number] DOB: 1961-08-17 Today's Date: 08/26/2019   History of Present Illness  58 y.o. female presenting with chest pain. PMH is significant for migraines, HTN, fibromyalgia, GERD, HLD,and  seasonal allergies. Chest pain worsens with mobility in bed.  Clinical Impression  PT demonstrates deficits in activity tolerance, functional mobility, gait, balance, and power. Pt limited by chest pain, dizziness, and SOB during session although SpO2 WFL. Pt demonstrates reduced gait speed and decreased activity tolerance, requiring multiple standing rest breaks during short ambulation distances. Pt will benefit from continued acute PT services to restore her PLOF.    Follow Up Recommendations No PT follow up;Supervision/Assistance - 24 hour(pt reports she is near her baseline)    Equipment Recommendations  None recommended by PT(pt already owns RW)    Recommendations for Other Services       Precautions / Restrictions Precautions Precautions: Fall Restrictions Weight Bearing Restrictions: No      Mobility  Bed Mobility Overal bed mobility: Needs Assistance Bed Mobility: Supine to Sit;Sit to Supine     Supine to sit: Supervision;HOB elevated Sit to supine: Supervision;HOB elevated      Transfers Overall transfer level: Needs assistance Equipment used: Rolling walker (2 wheeled) Transfers: Sit to/from Stand Sit to Stand: Supervision            Ambulation/Gait Ambulation/Gait assistance: Min guard Gait Distance (Feet): 50 Feet Assistive device: Rolling walker (2 wheeled) Gait Pattern/deviations: Step-to pattern;Trunk flexed Gait velocity: reduced Gait velocity interpretation: <1.8 ft/sec, indicate of risk for recurrent falls General Gait Details: pt with slowed step to gait with decreased step length. Pt requiring 2 standig rest breaks due to increased pressure/pain in chest, pt returned to bed as  quickly as possible with increase in chest pain  Stairs            Wheelchair Mobility    Modified Rankin (Stroke Patients Only)       Balance Overall balance assessment: Needs assistance Sitting-balance support: No upper extremity supported;Feet supported Sitting balance-Leahy Scale: Good     Standing balance support: Bilateral upper extremity supported Standing balance-Leahy Scale: Good Standing balance comment: supervision with BUE support of RW                             Pertinent Vitals/Pain Pain Assessment: 0-10 Pain Score: 10-Worst pain ever Pain Location: chest Pain Descriptors / Indicators: Pressure Pain Intervention(s): Limited activity within patient's tolerance;Monitored during session(RN notified)    Home Living Family/patient expects to be discharged to:: Private residence Living Arrangements: Spouse/significant other Available Help at Discharge: Family;Friend(s);Available 24 hours/day(spouse works 11-7, other family/friends PRN during day) Type of Home: House Home Access: [place]: One [place]: [place] - single point;Walker - 2 wheels;Bedside commode;Shower seat      Prior Function Level of Independence: Needs assistance   Gait / Transfers Assistance Needed: PT reports requiring physical assistance for bed mobility and transfers during fibromyalgia flare ups, otherwise is modI with RW            Hand Dominance   Dominant Hand: Right    Extremity/Trunk Assessment   Upper Extremity Assessment Upper Extremity Assessment: Generalized weakness    Lower Extremity Assessment Lower Extremity Assessment: Generalized weakness(LLE numbness)    Cervical / Trunk Assessment Cervical / Trunk Assessment: Normal  Communication   Communication: No difficulties  Cognition Arousal/Alertness: Awake/alert Behavior  During Therapy: WFL for tasks assessed/performed Overall Cognitive Status: Within  Functional Limits for tasks assessed                                        General Comments General comments (skin integrity, edema, etc.): VSS during session, pt reports increased chest pain from 5/10 at rest to 10/10 with mobility, pt also cites SOB and dizziness during ambulation, sats [facility] on RA    Exercises     Assessment/Plan    PT Assessment Patient needs continued PT services  PT Problem List Decreased strength;Decreased activity tolerance;Decreased balance;Decreased mobility;Decreased safety awareness;Decreased knowledge of precautions;Cardiopulmonary status limiting activity;Pain       PT Treatment Interventions DME instruction;Gait training;Functional mobility training;Therapeutic activities;Therapeutic exercise;Balance training;Neuromuscular re-education;Cognitive remediation    PT Goals (Current goals can be found in the Care Plan section)  Acute Rehab PT Goals Patient Stated Goal: To reduce chest pain PT Goal Formulation: With patient Time For Goal Achievement: 09/09/19 Potential to Achieve Goals: Good    Frequency Min 3X/week   Barriers to discharge        Co-evaluation               AM-PAC PT "6 Clicks" Mobility  Outcome Measure Help needed turning from your back to your side while in a flat bed without using bedrails?: None Help needed moving from lying on your back to sitting on the side of a flat bed without using bedrails?: None Help needed moving to and from a bed to a chair (including a wheelchair)?: None Help needed standing up from a chair using your arms (e.g., wheelchair or bedside chair)?: None Help needed to walk in hospital room?: A Little Help needed climbing 3-5 steps with a railing? : A Lot 6 Click Score: 21    End of Session Equipment Utilized During Treatment: Gait belt Activity Tolerance: Patient limited by pain Patient left: in bed;with call bell/phone within reach Nurse Communication: Mobility status PT Visit  Diagnosis: Muscle weakness (generalized) (M62.81);Pain Pain - Right/Left: (chest) Pain - part of body: (chest)    Time: [id number] PT Time Calculation (min) (ACUTE ONLY): 19 min   Charges:   PT Evaluation $PT Eval Moderate Complexity: 1 Mod          Zenaida Niece, PT, DPT Acute Rehabilitation Pager: [phone]   Zenaida Niece 08/26/2019, 8:35 AM

## 2019-08-26 NOTE — Progress Notes (Signed)
Subjective:  Denies any anginal chest pain or shortness of breaths.  Nuclear stress could not be done earlier today as patient ate breakfast.states has appointment to see GI next week.  2-D echo showed normal wall motion with good LV systolic function Objective:  Vital Signs in the last 24 hours: Temp:  [97.9 F (36.6 C)-98.7 F (37.1 C)] 98.4 F (36.9 C) (11/27 1244) Pulse Rate:  [75-105] 93 (11/27 1244) Resp:  [13-21] 16 (11/27 1244) BP: (97-137)/(59-84) 133/80 (11/27 1244) SpO2:  [90 %-100 %] 94 % (11/27 1244) Weight:  [66.6 kg-66.8 kg] 66.8 kg (11/27 0009)  Intake/Output from previous day: 11/26 0701 - 11/27 0700 In: 720 [P.O.:720] Out: 1800 [Urine:1800] Intake/Output from this shift: Total I/O In: 240 [P.O.:240] Out: 975 [Urine:975]  Physical Exam: Neck: no adenopathy, no carotid bruit, no JVD and supple, symmetrical, trachea midline Lungs: clear to auscultation bilaterally Heart: regular rate and rhythm, S1, S2 normal, no murmur, click, rub or gallop Abdomen: soft, non-tender; bowel sounds normal; no masses,  no organomegaly Extremities: extremities normal, atraumatic, no cyanosis or edema  Lab Results: Recent Labs    08/25/19 1113 08/26/19 0406  WBC 8.6 6.4  HGB 9.7* 8.2*  PLT 224 212   Recent Labs    08/25/19 1113 08/26/19 0406  NA 138 141  K 4.4 4.2  CL 112* 114*  CO2 19* 21*  GLUCOSE 80 77  BUN 18 13  CREATININE 1.45* 1.04*   No results for input(s): TROPONINI in the last 72 hours.  Invalid input(s): CK, MB Hepatic Function Panel No results for input(s): PROT, ALBUMIN, AST, ALT, ALKPHOS, BILITOT, BILIDIR, IBILI in the last 72 hours. Recent Labs    08/25/19 1730  CHOL 101   No results for input(s): PROTIME in the last 72 hours.  Imaging: Imaging results have been reviewed and Dg Chest Port 1 View  Result Date: 08/25/2019 CLINICAL DATA:  Chest pain, shortness of breath EXAM: PORTABLE CHEST 1 VIEW COMPARISON:  CTA chest dated 10/06/2018  FINDINGS: Lungs are clear.  No pleural effusion or pneumothorax. The heart is normal in size. IMPRESSION: No evidence of acute cardiopulmonary disease. Electronically Signed   By: Julian Hy M.D.   On: 08/25/2019 12:36   Ct Angio Chest/abd/pel For Dissection W And/or W/wo  Result Date: 08/25/2019 CLINICAL DATA:  Mid back pain for 2 days.  Hypotension. EXAM: CTA CHEST, ABDOMEN, AND PELVIS WITHOUT AND WITH CONTRAST TECHNIQUE: Multidetector CT imaging of the chest chest was performed prior to intravenous contrast. CT imaging was then acquired during the intravenous infusion contrast per standard, through the chest, abdomen and pelvis. Computer generated coronal MPR and MIP reconstructed images acquired. CONTRAST:  79mL OMNIPAQUE IOHEXOL 300 MG/ML  SOLN COMPARISON:  CT, 10/06/2018. FINDINGS: CT CHEST FINDINGS Cardiovascular: Aorta is normal in caliber. No dissection. No atherosclerosis. Pulmonary arteries relatively well opacified. There is no evidence a central embolus. Pulmonary arteries are normal in caliber. Heart is normal in size and configuration. No pericardial effusion. Mediastinum/Nodes: Several thyroid nodules, largest the posterior right lobe measuring 2.2 cm, unchanged from the prior CT. No neck base, axillary, mediastinal masses or enlarged lymph nodes. Trachea and esophagus unremarkable. Lungs/Pleura: Minor dependent linear atelectasis. Lungs otherwise clear. No pleural effusion or pneumothorax. Musculoskeletal: Mild degenerative changes of the thoracic spine. No fracture or acute finding. No bone lesion. No chest wall mass. CT ABDOMEN PELVIS FINDINGS CTA findings Aorta: Normal in caliber.  No dissection.  No atherosclerosis. Celiac axis: Widely patent. SMA: Widely patent. Renal  arteries: Widely patent. IMA: Widely patent. Iliac vessels: Common, external and internal iliac arteries are widely patent. Non CTA findings Hepatobiliary: Liver normal in size. No mass focal lesion. Status post  cholecystectomy. No bile duct dilation. Pancreas: Unremarkable. No pancreatic ductal dilatation or surrounding inflammatory changes. Spleen: Normal in size without focal abnormality. Adrenals/Urinary Tract: No adrenal masses. Kidneys normal in overall size orientation and position. Low-density renal masses are noted consistent cysts, stable prior CT. No hydronephrosis. Normal ureters. Normal bladder. 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 No stomach wall thickening or inflammation. Small bowel is normal in caliber. No wall thickening or inflammation. Mild gaseous distention of the colon. No colonic wall thickening. No inflammation. Normal appendix visualized. Lymphatic: No enlarged lymph nodes. Reproductive: Status post hysterectomy. No adnexal masses. Other: Midline anterior abdominal wall scar is stable. No hernia. No ascites. Musculoskeletal: L4 through S1 posterior lumbar spine fusion. This is new since the prior CT. Orthopedic hardware appears well seated and well-positioned. No fracture or acute finding.  No bone lesion. IMPRESSION: CTA FINDINGS 1. Normal caliber thoracoabdominal aorta. No dissection. No atherosclerosis. 2. Aortic branch vessels are widely patent.  No stenosis. CHEST CT 1. No acute findings. 2. 2.2 cm right thyroid nodule, present on the previous CT. This is stable from older CTs as well. As noted on the prior CT, recommend follow-up thyroid ultrasound if this has not been previously performed. ABDOMEN AND PELVIS CT 1. No acute findings. 2. Stable changes prior gastric bypass surgery, cholecystectomy and hysterectomy. 3. New changes a posterior L4 through S1 lumbar spine fusion. Electronically Signed   By: Lajean Manes M.D.   On: 08/25/2019 13:22    Cardiac Studies:  Assessment/Plan:  Atypical chest pain with minimally elevated high-sensitivity troponin I doubt significant MI Hypertension Hyperlipidemia History of morbid obesity status post gastric bypass  surgery History of obstructive sleep apnea Fibromyalgia History of CVA in remote past Depression Acute on chronic anemia, probably secondary to hydration and rule out GI loss Acute kidney injury probably secondary to nonsteroidal anti-inflammatory meds.improved Depression GERD Degenerative joint disease status post lumbar fusion in the past Plan Continue present managemet Recheduled for nuclear stress test in a.m.if discharged can be arranged as outpatient Dr.Kadakia on call for me for a weekend  LOS: 1 day    Charolette Forward 08/26/2019, 12:50 PM

## 2019-08-26 NOTE — Progress Notes (Signed)
Family Medicine Teaching Service Daily Progress Note Intern Pager: [phone]  Patient name: Jodi Bowman record number: [id number] Date of birth: [birth year] Age: 58 y.o. Gender: female  Primary Care Provider: Aletha Halim., PA-C Consultants: Cardiology Code Status: Full  Pt Overview and Major Events to Date:  11/26: Admitted for ACS rule out  Assessment and Plan: Jodi Bowman is a 58 y.o. female presenting with chest pain. PMH is significant for migraines, HTN, fibromyalgia, h/o gastric bypass (2018), GERD, HLD, h/o CVA and seasonal allergies.  Atypical chest pain In the ED she had an EKG which showed normal sinus rhythm. Troponins 21>20>21>23.  Chest x-ray showed no acute events.  CTA chest with no signs of dissection.  Patient reports that her chest pain is worse when she moves in the bed.  She states that nothing has really helped so far. -Cardiology consulted, appreciate recommendations -Echo in AM 11/27 -Schedule for Lexiscan Myoview -PT/OT eval and treat -Continuous cardiac monitoring  AKI Patient's baseline creatinine is around 0.8.  On admission patient's creatinine was 1.45 and is now 1.04 s/p 1.5L IVF.  Patient reports she has had increased urination over the past 2 weeks.  Patient also acknowledges taking ibuprofen for this chest pain.  Patient also takes daily low-dose aspirin. -Trend BMP / urinary output -Replace electrolytes as indicated -Avoid nephrotoxic agents, ensure adequate renal perfusion -holding losartan and amiloride  Normocytic Anemia Patient's hemoglobin on admission was 9.7, this am is 8.2.  Patient's baseline hemoglobin over the past 6 months has ranged between 9 and 11. Patient has history of gastric bypass surgery (2018) and is taking ibuprofen for pain. Last colonoscopy 2014.  -Avoid NSAIDs -Monitor for worsening signs/symptoms of anemia  HTN Patient's home meds include amlodipine 5 mg daily, losartan, amiloride 5 mg daily,  metoprolol 100 mg daily. -Continue metoprolol 100 mg daily -Holding losartan, Amiloride at this time due to AKI  Hyperlipidemia Patient reports she has history of hyperlipidemia and that she was prescribed medication previously, but she no longer takes it. -Lipid panel wnl  Sleep apnea Patient has CPAP at home which she reports she does not use because she reports she had sleep apnea prior to her weight loss surgery but since that time has not used it. -CPAP in hospital  Reactive airway disease Patient has home albuterol for episodes of shortness of breath. -Continue home albuterol PRN  Fibromyalgia  Chronic back pain Patient is prescribed Cymbalta for fibromyalgia.  She also receives hydrocodone which she takes as needed.  Patient reports she uses hydrocodone "maybe twice a week". Patient also with h/o L4-5/L5-S1 decompressive laminectomy and foraminotomies with posterior lumbar interbody fusion on 03/04/2019.  -Continue Cymbalta 60mg   Peripheral neuropathy Patient is prescribed pregabalin with his peripheral numbness. -Continue home pregabalin  GERD Patient takes pantoprazole 40 mg daily. -Continue home medications  Seasonal allergies Patient takes Flonase and Claritin daily for allergies. -Continue Flonase daily  FEN/GI: Heart healthy/ carb modified Prophylaxis: Lovenox  Disposition: Pending work-up  Subjective:  Patient reports that this morning her chest pain is unchanged.  States it is worse when she moves in bed.  She can still touch and it hurts.  She reports that she is quite thirsty this morning and has not been given anything to drink.  Objective: Temp:  [97.8 F (36.6 C)-98.3 F (36.8 C)] 98.3 F (36.8 C) (11/27 0553) Pulse Rate:  [75-91] 75 (11/27 0553) Resp:  [13-21] 18 (11/27 0553) BP: (87-137)/(59-84) 112/63 (11/27 0553) SpO2:  [90 %-  100 %] 90 % (11/27 0553) Weight:  [66.6 kg-66.8 kg] 66.8 kg (11/27 0009) Physical Exam: General: NAD,  pleasant Cardiovascular: RRR, no m/r/g, no LE edema, TTP over mid chest Respiratory: CTA BL, normal work of breathing MSK: moves 4 extremities equally Derm: no rashes appreciated Neuro: CN II-XII grossly intact Psych: AOx3, appropriate affect  Laboratory: Recent Labs  Lab 08/25/19 1113 08/26/19 0406  WBC 8.6 6.4  HGB 9.7* 8.2*  HCT 30.8* 25.6*  PLT 224 212   Recent Labs  Lab 08/25/19 1113 08/26/19 0406  NA 138 141  K 4.4 4.2  CL 112* 114*  CO2 19* 21*  BUN 18 13  CREATININE 1.45* 1.04*  CALCIUM 8.8* 8.4*  GLUCOSE 80 77   Lipid Panel     Component Value Date/Time   CHOL 101 08/25/2019 1730   TRIG 50 08/25/2019 1730   HDL 59 08/25/2019 1730   CHOLHDL 1.7 08/25/2019 1730   VLDL 10 08/25/2019 1730   LDLCALC 32 08/25/2019 1730   Ferritin 231 Vitamin B12 308 Hemoglobin A1c 4.2   Imaging/Diagnostic Tests: Dg Chest Port 1 View  Result Date: 08/25/2019 CLINICAL DATA:  Chest pain, shortness of breath EXAM: PORTABLE CHEST 1 VIEW COMPARISON:  CTA chest dated 10/06/2018 FINDINGS: Lungs are clear.  No pleural effusion or pneumothorax. The heart is normal in size. IMPRESSION: No evidence of acute cardiopulmonary disease. Electronically Signed   By: Julian Hy M.D.   On: 08/25/2019 12:36   Ct Angio Chest/abd/pel For Dissection W And/or W/wo  Result Date: 08/25/2019 CLINICAL DATA:  Mid back pain for 2 days.  Hypotension. EXAM: CTA CHEST, ABDOMEN, AND PELVIS WITHOUT AND WITH CONTRAST TECHNIQUE: Multidetector CT imaging of the chest chest was performed prior to intravenous contrast. CT imaging was then acquired during the intravenous infusion contrast per standard, through the chest, abdomen and pelvis. Computer generated coronal MPR and MIP reconstructed images acquired. CONTRAST:  36mL OMNIPAQUE IOHEXOL 300 MG/ML  SOLN COMPARISON:  CT, 10/06/2018. FINDINGS: CT CHEST FINDINGS Cardiovascular: Aorta is normal in caliber. No dissection. No atherosclerosis. Pulmonary arteries  relatively well opacified. There is no evidence a central embolus. Pulmonary arteries are normal in caliber. Heart is normal in size and configuration. No pericardial effusion. Mediastinum/Nodes: Several thyroid nodules, largest the posterior right lobe measuring 2.2 cm, unchanged from the prior CT. No neck base, axillary, mediastinal masses or enlarged lymph nodes. Trachea and esophagus unremarkable. Lungs/Pleura: Minor dependent linear atelectasis. Lungs otherwise clear. No pleural effusion or pneumothorax. Musculoskeletal: Mild degenerative changes of the thoracic spine. No fracture or acute finding. No bone lesion. No chest wall mass. CT ABDOMEN PELVIS FINDINGS CTA findings Aorta: Normal in caliber.  No dissection.  No atherosclerosis. Celiac axis: Widely patent. SMA: Widely patent. Renal arteries: Widely patent. IMA: Widely patent. Iliac vessels: Common, external and internal iliac arteries are widely patent. Non CTA findings Hepatobiliary: Liver normal in size. No mass focal lesion. Status post cholecystectomy. No bile duct dilation. Pancreas: Unremarkable. No pancreatic ductal dilatation or surrounding inflammatory changes. Spleen: Normal in size without focal abnormality. Adrenals/Urinary Tract: No adrenal masses. Kidneys normal in overall size orientation and position. Low-density renal masses are noted consistent cysts, stable prior CT. No hydronephrosis. Normal ureters. Normal bladder. 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 No stomach wall thickening or inflammation. Small bowel is normal in caliber. No wall thickening or inflammation. Mild gaseous distention of the colon. No colonic wall thickening. No inflammation. Normal appendix visualized. Lymphatic: No enlarged lymph nodes. Reproductive: Status  post hysterectomy. No adnexal masses. Other: Midline anterior abdominal wall scar is stable. No hernia. No ascites. Musculoskeletal: L4 through S1 posterior lumbar spine fusion. This  is new since the prior CT. Orthopedic hardware appears well seated and well-positioned. No fracture or acute finding.  No bone lesion. IMPRESSION: CTA FINDINGS 1. Normal caliber thoracoabdominal aorta. No dissection. No atherosclerosis. 2. Aortic branch vessels are widely patent.  No stenosis. CHEST CT 1. No acute findings. 2. 2.2 cm right thyroid nodule, present on the previous CT. This is stable from older CTs as well. As noted on the prior CT, recommend follow-up thyroid ultrasound if this has not been previously performed. ABDOMEN AND PELVIS CT 1. No acute findings. 2. Stable changes prior gastric bypass surgery, cholecystectomy and hysterectomy. 3. New changes a posterior L4 through S1 lumbar spine fusion. Electronically Signed   By: Lajean Manes M.D.   On: 08/25/2019 13:22     Kishon Garriga, [place], DO 08/26/2019, 7:19 AM PGY-3, [place] Intern pager: [phone], text pages welcome

## 2019-08-26 NOTE — Progress Notes (Signed)
  Echocardiogram 2D Echocardiogram has been performed.  Jodi Bowman 08/26/2019, 11:17 AM

## 2019-08-26 NOTE — Progress Notes (Signed)
Patient is NPO at midnight for a procedure schedule morning 11/28.    Do not give food  -Dr. Criss Rosales

## 2019-08-26 NOTE — Evaluation (Signed)
Occupational Therapy Evaluation Patient Details Name: Jodi Bowman MRN: [id number] DOB: Oct 29, 1960 Today's Date: 08/26/2019    History of Present Illness 58 y.o. female presenting with chest pain. PMH is significant for migraines, HTN, fibromyalgia, GERD, HLD,and  seasonal allergies. Chest pain worsens with mobility in bed.   Clinical Impression   This 58 y/o female presents with the above. PTA pt reports mod independence with ADL and functional mobility. Pt completing room level mobility using RW with overall minguard assist this session. Pt engaging in bathing/dressing ADL seated at sink, requiring minA for LB ADL tasks. Max HR noted 133 with standing activity with cues provided for seated rest break and HR returning to low 100s. Pt reports she has multiple family members who are able to assist with ADL/iADL PRN after discharge. She will benefit from continued acute OT services to progress her towards her PLOF. Do not anticipate pt will require follow up OT services. Will follow.     Follow Up Recommendations  No OT follow up;Supervision/Assistance - 24 hour    Equipment Recommendations  None recommended by OT           Precautions / Restrictions Precautions Precautions: Fall Restrictions Weight Bearing Restrictions: No      Mobility Bed Mobility Overal bed mobility: Needs Assistance Bed Mobility: Supine to Sit     Supine to sit: Supervision;HOB elevated Sit to supine: Supervision;HOB elevated   General bed mobility comments: for safety, pt seated EOB with RN end of session  Transfers Overall transfer level: Needs assistance Equipment used: Rolling walker (2 wheeled) Transfers: Sit to/from Stand Sit to Stand: Min guard         General transfer comment: for safety and balance    Balance Overall balance assessment: Needs assistance Sitting-balance support: No upper extremity supported;Feet supported Sitting balance-Leahy Scale: Good     Standing balance  support: Bilateral upper extremity supported Standing balance-Leahy Scale: Fair Standing balance comment: able to maintain static standing during ADL tasks without UE support                           ADL either performed or assessed with clinical judgement   ADL Overall ADL's : Needs assistance/impaired Eating/Feeding: Modified independent;Sitting   Grooming: Oral care;Wash/dry face;Sitting;Supervision/safety   Upper Body Bathing: Supervision/ safety;Set up;Sitting Upper Body Bathing Details (indicate cue type and reason): seated at sink in room Lower Body Bathing: Min guard;Sit to/from stand Lower Body Bathing Details (indicate cue type and reason): close minguard for balance Upper Body Dressing : Sitting;Supervision/safety   Lower Body Dressing: Minimal assistance;Sit to/from stand Lower Body Dressing Details (indicate cue type and reason): assist to don L sock only, pt donning pants and R sock without assist, though requires increased time/effort Toilet Transfer: Min guard;Ambulation;RW Toilet Transfer Details (indicate cue type and reason): simulated via transfer to/from EOB, room level mobility Toileting- Clothing Manipulation and Hygiene: Min guard;Sit to/from stand       Functional mobility during ADLs: Surveyor, minerals     Praxis      Pertinent Vitals/Pain Pain Assessment: Faces Pain Score: 10-Worst pain ever Faces Pain Scale: Hurts even more Pain Location: chest Pain Descriptors / Indicators: Pressure Pain Intervention(s): Limited activity within patient's tolerance;Monitored during session;Repositioned     Hand Dominance Right   Extremity/Trunk Assessment Upper Extremity Assessment Upper Extremity Assessment: Generalized weakness  Lower Extremity Assessment Lower Extremity Assessment: Defer to PT evaluation   Cervical / Trunk Assessment Cervical / Trunk Assessment: Normal   Communication  Communication Communication: No difficulties   Cognition Arousal/Alertness: Awake/alert Behavior During Therapy: WFL for tasks assessed/performed Overall Cognitive Status: Within Functional Limits for tasks assessed                                     General Comments  VSS during session, pt reports increased chest pain from 5/10 at rest to 10/10 with mobility, pt also cites SOB and dizziness during ambulation, sats WFL on RA    Exercises     Shoulder Instructions      Home Living Family/patient expects to be discharged to:: Private residence Living Arrangements: Spouse/significant other Available Help at Discharge: Family;Friend(s);Available 24 hours/day(spouse works 11-7, other family/friends PRN during day) Type of Home: House Home Access: [place]: One level     Bathroom Shower/Tub: Teacher, early years/pre: Standard Bathroom Accessibility: Yes   Home Equipment: Cane - single point;Walker - 2 wheels;Bedside commode;Shower seat          Prior Functioning/Environment Level of Independence: Needs assistance  Gait / Transfers Assistance Needed: PT reports requiring physical assistance for bed mobility and transfers during fibromyalgia flare ups, otherwise is modI with RW  ADL's / Homemaking Assistance Needed: reports no assist needed            OT Problem List: Decreased strength;Decreased range of motion;Decreased activity tolerance;Impaired balance (sitting and/or standing);Decreased safety awareness;Decreased knowledge of use of DME or AE;Cardiopulmonary status limiting activity;Pain      OT Treatment/Interventions: Self-care/ADL training;Therapeutic exercise;Energy conservation;DME and/or AE instruction;Therapeutic activities;Patient/family education;Balance training    OT Goals(Current goals can be found in the care plan section) Acute Rehab OT Goals Patient Stated Goal: To reduce chest pain OT Goal Formulation:  With patient Time For Goal Achievement: 09/09/19 Potential to Achieve Goals: Good  OT Frequency: Min 2X/week   Barriers to D/C:            Co-evaluation              AM-PAC OT "6 Clicks" Daily Activity     Outcome Measure Help from another person eating meals?: None Help from another person taking care of personal grooming?: None Help from another person toileting, which includes using toliet, bedpan, or urinal?: A Little Help from another person bathing (including washing, rinsing, drying)?: A Little Help from another person to put on and taking off regular upper body clothing?: None Help from another person to put on and taking off regular lower body clothing?: A Little 6 Click Score: 21   End of Session Equipment Utilized During Treatment: Rolling walker Nurse Communication: Mobility status  Activity Tolerance: Patient tolerated treatment well Patient left: with call bell/phone within reach;with nursing/sitter in room(seated EOB with RN giving meds)  OT Visit Diagnosis: Muscle weakness (generalized) (M62.81);Unsteadiness on feet (R26.81)                Time: [id number] OT Time Calculation (min): 26 min Charges:  OT General Charges $OT Visit: 1 Visit OT Evaluation $OT Eval Moderate Complexity: 1 Mod OT Treatments $Self Care/Home Management : 8-22 mins  Lou Cal, OT [facility] Services Pager [phone] Office [phone]   Raymondo Band 08/26/2019, 10:44 AM

## 2019-08-27 ENCOUNTER — Inpatient Hospital Stay (HOSPITAL_COMMUNITY): Payer: Medicare Other

## 2019-08-27 DIAGNOSIS — I959 Hypotension, unspecified: Secondary | ICD-10-CM

## 2019-08-27 LAB — BASIC METABOLIC PANEL
Anion gap: 7 (ref 5–15)
BUN: 12 mg/dL (ref 6–20)
CO2: 24 mmol/L (ref 22–32)
Calcium: 8.5 mg/dL — ABNORMAL LOW (ref 8.9–10.3)
Chloride: 108 mmol/L (ref 98–111)
Creatinine, Ser: 0.89 mg/dL (ref 0.44–1.00)
GFR calc Af Amer: >60 mL/min (ref >60)
GFR calc non Af Amer: >60 mL/min (ref >60)
Glucose, Bld: 81 mg/dL (ref 70–99)
Potassium: 4.3 mmol/L (ref 3.5–5.1)
Sodium: 139 mmol/L (ref 135–145)

## 2019-08-27 LAB — GLUCOSE, CAPILLARY: Glucose-Capillary: 92 mg/dL (ref 70–99)

## 2019-08-27 MED ORDER — PREGABALIN 25 MG PO CAPS
50.0000 mg | ORAL_CAPSULE | Freq: Once | ORAL | Status: AC
  Administered 2019-08-27: 50 mg via ORAL
  Filled 2019-08-27: qty 2

## 2019-08-27 MED ORDER — REGADENOSON 0.4 MG/5ML IV SOLN
INTRAVENOUS | Status: AC
  Filled 2019-08-27: qty 5

## 2019-08-27 MED ORDER — REGADENOSON 0.4 MG/5ML IV SOLN
0.4000 mg | Freq: Once | INTRAVENOUS | Status: AC
  Filled 2019-08-27: qty 5

## 2019-08-27 MED ORDER — MORPHINE SULFATE (PF) 2 MG/ML IV SOLN
1.0000 mg | Freq: Once | INTRAVENOUS | Status: AC
  Administered 2019-08-27: 1 mg via INTRAVENOUS
  Filled 2019-08-27: qty 1

## 2019-08-27 MED ORDER — DICLOFENAC SODIUM 1 % EX GEL
4.0000 g | Freq: Four times a day (QID) | CUTANEOUS | Status: DC | PRN
  Administered 2019-08-28 – 2019-08-29 (×6): 4 g via TOPICAL
  Filled 2019-08-27: qty 100

## 2019-08-27 MED ORDER — OXYCODONE HCL 5 MG PO TABS
5.0000 mg | ORAL_TABLET | Freq: Four times a day (QID) | ORAL | Status: DC | PRN
  Administered 2019-08-27 – 2019-08-28 (×4): 5 mg via ORAL
  Filled 2019-08-27 (×4): qty 1

## 2019-08-27 MED ORDER — TECHNETIUM TC 99M TETROFOSMIN IV KIT
30.0000 | PACK | Freq: Once | INTRAVENOUS | Status: AC | PRN
  Administered 2019-08-27: 30 via INTRAVENOUS

## 2019-08-27 MED ORDER — TECHNETIUM TC 99M TETROFOSMIN IV KIT
10.0000 | PACK | Freq: Once | INTRAVENOUS | Status: AC | PRN
  Administered 2019-08-27: 10 via INTRAVENOUS

## 2019-08-27 NOTE — Consult Note (Signed)
Ref: Aletha Halim., PA-C   Subjective:  EKG negative, chest pain positive on Lexiscan perfusion.  Nuclear images shows reversible apical and inferior and lateral wall ischemia with preserved LV systolic function.  Objective:  Vital Signs in the last 24 hours: Temp:  [97.8 F (36.6 C)-98.7 F (37.1 C)] 98.3 F (36.8 C) (11/28 1335) Pulse Rate:  [66-100] 79 (11/28 1335) Cardiac Rhythm: Normal sinus rhythm (11/28 0701) Resp:  [16-20] 18 (11/28 1335) BP: (90-130)/(51-88) 123/78 (11/28 1335) SpO2:  [92 %-100 %] 100 % (11/28 1335) Weight:  [65.1 kg] 65.1 kg (11/28 [id number])  Physical Exam: BP Readings from Last 1 Encounters:  08/27/19 123/78     Wt Readings from Last 1 Encounters:  08/27/19 65.1 kg    Weight change: -1.497 kg Body mass index is 28.98 kg/m. HEENT: [place]/AT, Eyes-Brown, PERL, EOMI, Conjunctiva-Pale, Sclera-Non-icteric Neck: No JVD, No bruit, Trachea midline. Lungs:  Clear, Bilateral. Cardiac:  Regular rhythm, normal S1 and S2, no S3. II/VI systolic murmur. Abdomen:  Soft, non-tender. BS present. Extremities:  No edema present. No cyanosis. No clubbing. CNS: AxOx3, Cranial nerves grossly intact, moves all 4 extremities.  Skin: Warm and dry.   Intake/Output from previous day: 11/27 0701 - 11/28 0700 In: 480 [P.O.:480] Out: 2975 [Urine:2975]    Lab Results: BMET    Component Value Date/Time   NA 139 08/27/2019 0505   NA 141 08/26/2019 0406   NA 138 08/25/2019 1113   K 4.3 08/27/2019 0505   K 4.2 08/26/2019 0406   K 4.4 08/25/2019 1113   CL 108 08/27/2019 0505   CL 114 (H) 08/26/2019 0406   CL 112 (H) 08/25/2019 1113   CO2 24 08/27/2019 0505   CO2 21 (L) 08/26/2019 0406   CO2 19 (L) 08/25/2019 1113   GLUCOSE 81 08/27/2019 0505   GLUCOSE 77 08/26/2019 0406   GLUCOSE 80 08/25/2019 1113   BUN 12 08/27/2019 0505   BUN 13 08/26/2019 0406   BUN 18 08/25/2019 1113   CREATININE 0.89 08/27/2019 0505   CREATININE 1.04 (H) 08/26/2019 0406   CREATININE 1.45  (H) 08/25/2019 1113   CALCIUM 8.5 (L) 08/27/2019 0505   CALCIUM 8.4 (L) 08/26/2019 0406   CALCIUM 8.8 (L) 08/25/2019 1113   GFRNONAA >60 08/27/2019 0505   GFRNONAA 59 (L) 08/26/2019 0406   GFRNONAA 39 (L) 08/25/2019 1113   GFRAA >60 08/27/2019 0505   GFRAA >60 08/26/2019 0406   GFRAA 46 (L) 08/25/2019 1113   CBC    Component Value Date/Time   WBC 6.3 08/26/2019 1357   RBC 2.87 (L) 08/26/2019 1357   HGB 8.9 (L) 08/26/2019 1357   HCT 26.9 (L) 08/26/2019 1357   PLT 228 08/26/2019 1357   MCV 93.7 08/26/2019 1357   MCH 31.0 08/26/2019 1357   MCHC 33.1 08/26/2019 1357   RDW 14.9 08/26/2019 1357   LYMPHSABS 4.4 (H) 04/26/2019 1719   MONOABS 0.8 04/26/2019 1719   EOSABS 0.1 04/26/2019 1719   BASOSABS 0.0 04/26/2019 1719   HEPATIC Function Panel Recent Labs    10/06/18 0154 03/09/19 0707  PROT 5.9* 4.9*   HEMOGLOBIN A1C No components found for: HGA1C,  MPG CARDIAC ENZYMES Lab Results  Component Value Date   CKTOTAL 198 (H) 07/08/2010   CKMB 2.7 07/08/2010   TROPONINI <0.03 03/14/2019   TROPONINI <0.03 03/14/2019   TROPONINI <0.03 03/13/2019   BNP No results for input(s): PROBNP in the last 8760 hours. TSH No results for input(s): TSH in the last 8760  hours. CHOLESTEROL Recent Labs    08/25/19 1730  CHOL 101    Scheduled Meds: . aspirin EC  81 mg Oral Daily  . DULoxetine  60 mg Oral Daily  . enoxaparin (LOVENOX) injection  40 mg Subcutaneous Q24H  . folic acid  1 mg Oral Daily  . lidocaine  1 patch Transdermal Q24H  . metoprolol succinate  100 mg Oral QHS  . pantoprazole  40 mg Oral BID   Continuous Infusions: PRN Meds:.acetaminophen, ondansetron (ZOFRAN) IV, oxyCODONE, polyethylene glycol  Assessment/Plan: Acute coronary syndrome Hypertension Hyperlipidemia H/O OSA Fibromyalgia Depression Acute on chronic anemia Acute kidney failure, resolved GERD Lower back pain with lumbar fusion  Cardiac catheterization with possible intervention. Patient  understood procedure, risk and alternative.   LOS: 2 days   Time spent including chart review, lab review, examination, discussion with patient : 30 min   Dixie Dials  MD  08/27/2019, 2:05 PM

## 2019-08-27 NOTE — Progress Notes (Signed)
Pt complained of 9/10 chest pressure. EKG done. Given oxycodone. MD Kris Mouton paged. Pt states she feels better, no complains at this time. Will monitor accordingly

## 2019-08-27 NOTE — Progress Notes (Signed)
Family Medicine Teaching Service Daily Progress Note Intern Pager: [phone]  Patient name: Jodi Bowman record number: [id number] Date of birth: 17-Jan-1961 Age: 58 y.o. Gender: female  Primary Care Provider: Aletha Halim., PA-C Consultants: Cardiology Code Status: Full  Pt Overview and Major Events to Date:  11/26: Admitted for ACS rule out 11/28: Myoview  Assessment and Plan: Jodi Bowman is a 58 y.o. female presenting with chest pain. PMH is significant for migraines, HTN, fibromyalgia, h/o gastric bypass (2018), GERD, HLD, h/o CVA and seasonal allergies.  Atypical chest pain Underwent Myoview this a.m., results still pending.  Felt unlikely to be of cardiac origin per cardiology.  If negative Myoview can likely chalked up to chronic pain and can likely discharge patient home after that.  Echo with EF 55 to 60%, no other abnormality. -Appreciate cardiology recs -Follow-up Myoview -PT/OT eval and treat -Continuous cardiac monitoring -Metoprolol 100 mg every 24 hours -Lidoderm patch daily -Lovenox 40 mg for DVT prophylaxis -Tylenol 650 mg every 4 hours.  AKI A.m. creatinine at 1.0.  Baseline around 0.8.  Will consider problem resolved.  Last BP 130/88.  Still holding losartan and amiloride.  Will restart if becomes hypertensive. -A.m. BMP -Encourage p.o. intake -Holding losartan, amiloride -> can restart if become hypertensive  Normocytic Anemia Hemoglobin on admission 9.7, this a.m. is 8.9.  Baseline appears to be between 9 and 11.  History of gastric bypass surgery has been taking NSAIDs for pain.  Would recommend patient hold NSAID usage.  MCV 93.  Takes ferrous sulfate 325 mg daily, resume on discharge.  -Avoid NSAIDs -Resume ferrous sulfate on discharge -A.m. CBC  HTN Most recent BP 130/88, has had some systolics in the low 0000000.  We will continue holding home losartan and amiloride.  Taking metoprolol daily.  Next -can continue losartan  amiloride if becomes hypotensive -Continue metoprolol XL 100 mg daily  Hyperlipidemia Patient reports she has history of hyperlipidemia and that she was prescribed medication previously, but she no longer takes it. -Lipid panel wnl  Sleep apnea Patient has CPAP at home which she reports she does not use because she reports she had sleep apnea prior to her weight loss surgery but since that time has not used it. -CPAP in hospital  Reactive airway disease Patient has home albuterol for episodes of shortness of breath. -Continue home albuterol PRN  Fibromyalgia  Chronic back pain Patient is prescribed Cymbalta for fibromyalgia.  She also receives hydrocodone which she takes as needed.  Patient reports she uses hydrocodone "maybe twice a week". Patient also with h/o L4-5/L5-S1 decompressive laminectomy and foraminotomies with posterior lumbar interbody fusion on 03/04/2019.  -Continue Cymbalta 60mg   Peripheral neuropathy Patient is prescribed pregabalin with his peripheral numbness. -Continue home pregabalin  GERD Patient takes pantoprazole 40 mg daily. -Continue home medications  Seasonal allergies Patient takes Flonase and Claritin daily for allergies. -Continue Flonase daily  FEN/GI: Heart healthy/ carb modified Prophylaxis: Lovenox  Disposition: Likely home pending work-up  Subjective:  Chest pain is feeling better after the Myoview.  She feels like it is at her "baseline".  No complaints, interested to know what the stress test shows  Objective: Temp:  [97.8 F (36.6 C)-98.7 F (37.1 C)] 97.8 F (36.6 C) (11/28 1114) Pulse Rate:  [66-100] 71 (11/28 1114) Resp:  [16-20] 20 (11/28 1114) BP: (90-133)/(51-88) 130/88 (11/28 1114) SpO2:  [92 %-100 %] 100 % (11/28 1114) Weight:  [65.1 kg] 65.1 kg (11/28 [id number]) Physical Exam: General: 58 year old  African-American female, no acute distress Cardiovascular: Regular rate rhythm, no M/R/G.  No palpable chest pain  tenderness Respiratory: Lungs clear to auscultation bilaterally, no accessory muscle use MSK: moves 4 extremities equally Neuro: No focal neuro deficits Psych: Pleasant, coherent thought process, appropriate  Laboratory: Recent Labs  Lab 08/25/19 1113 08/26/19 0406 08/26/19 1357  WBC 8.6 6.4 6.3  HGB 9.7* 8.2* 8.9*  HCT 30.8* 25.6* 26.9*  PLT 224 212 228   Recent Labs  Lab 08/25/19 1113 08/26/19 0406 08/27/19 0505  NA 138 141 139  K 4.4 4.2 4.3  CL 112* 114* 108  CO2 19* 21* 24  BUN 18 13 12   CREATININE 1.45* 1.04* 0.89  CALCIUM 8.8* 8.4* 8.5*  GLUCOSE 80 77 81   Lipid Panel     Component Value Date/Time   CHOL 101 08/25/2019 1730   TRIG 50 08/25/2019 1730   HDL 59 08/25/2019 1730   CHOLHDL 1.7 08/25/2019 1730   VLDL 10 08/25/2019 1730   LDLCALC 32 08/25/2019 1730    Imaging/Diagnostic Tests: No results found.   Guadalupe Dawn, MD 08/27/2019, 12:16 PM PGY-3, [place] Intern pager: [phone], text pages welcome

## 2019-08-27 NOTE — Progress Notes (Signed)
Physical Therapy Treatment Patient Details Name: Jodi Bowman MRN: [id number] DOB: 1961/04/19 Today's Date: 08/27/2019    History of Present Illness 58 y.o. female presenting with chest pain. PMH is significant for migraines, HTN, fibromyalgia, GERD, HLD,and  seasonal allergies. Chest pain worsens with mobility in bed.    PT Comments    Pt on commode and requesting back to bed.  Pt required close supervision to rise to standing and to mobilize.  Once in standing she was agreeable to ambulate in the halls.  She has numbness in L leg which causes an antlagic pattern.  Plan next session for continued gt training.    Follow Up Recommendations  No PT follow up;Supervision/Assistance - 24 hour(Pt reports she is near her baseline)     Equipment Recommendations  None recommended by PT    Recommendations for Other Services       Precautions / Restrictions Precautions Precautions: Fall Restrictions Weight Bearing Restrictions: No    Mobility  Bed Mobility Overal bed mobility: Needs Assistance Bed Mobility: Sit to Supine       Sit to supine: Supervision;HOB elevated   General bed mobility comments: for safety, pt seated EOB with RN end of session  Transfers Overall transfer level: Needs assistance Equipment used: Rolling walker (2 wheeled) Transfers: Sit to/from Stand Sit to Stand: Supervision         General transfer comment: for safety and balance  Ambulation/Gait Ambulation/Gait assistance: Min guard Gait Distance (Feet): 100 Feet Assistive device: Rolling walker (2 wheeled) Gait Pattern/deviations: Step-to pattern;Trunk flexed;Decreased stance time - right;Decreased step length - right Gait velocity: reduced   General Gait Details: Pt reports numbeness in L leg and required cues for weight to UEs to offset weight on LLE to improve R step length/height.   Stairs             Wheelchair Mobility    Modified Rankin (Stroke Patients Only)        Balance Overall balance assessment: Needs assistance Sitting-balance support: No upper extremity supported;Feet supported Sitting balance-Leahy Scale: Good       Standing balance-Leahy Scale: Fair                              Cognition Arousal/Alertness: Awake/alert Behavior During Therapy: WFL for tasks assessed/performed Overall Cognitive Status: Within Functional Limits for tasks assessed                                        Exercises      General Comments        Pertinent Vitals/Pain Pain Assessment: Faces Faces Pain Scale: Hurts even more Pain Location: chest, back Pain Descriptors / Indicators: Pressure Pain Intervention(s): Monitored during session;Repositioned    Home Living                      Prior Function            PT Goals (current goals can now be found in the care plan section) Acute Rehab PT Goals Patient Stated Goal: To reduce chest pain Potential to Achieve Goals: Good Progress towards PT goals: Progressing toward goals    Frequency    Min 3X/week      PT Plan Current plan remains appropriate    Co-evaluation  AM-PAC PT "6 Clicks" Mobility   Outcome Measure  Help needed turning from your back to your side while in a flat bed without using bedrails?: A Little Help needed moving from lying on your back to sitting on the side of a flat bed without using bedrails?: A Little Help needed moving to and from a bed to a chair (including a wheelchair)?: A Little Help needed standing up from a chair using your arms (e.g., wheelchair or bedside chair)?: A Little Help needed to walk in hospital room?: A Little Help needed climbing 3-5 steps with a railing? : A Little 6 Click Score: 18    End of Session Equipment Utilized During Treatment: Gait belt Activity Tolerance: Patient limited by pain Patient left: in bed;with call bell/phone within reach;with bed alarm set Nurse  Communication: Mobility status PT Visit Diagnosis: Muscle weakness (generalized) (M62.81);Pain Pain - Right/Left: (chest) Pain - part of body: (chest)     Time: [id number] PT Time Calculation (min) (ACUTE ONLY): 10 min  Charges:  $Gait Training: 8-22 mins                      Erasmo Leventhal , PTA Acute Rehabilitation Services Pager [phone] Office [phone]     Jodi Bowman Eli Hose 08/27/2019, 3:33 PM

## 2019-08-27 NOTE — Plan of Care (Signed)

## 2019-08-27 NOTE — Progress Notes (Signed)
Pt refuses cpap

## 2019-08-28 LAB — FOLATE RBC
Folate, Hemolysate: 209 ng/mL
Folate, RBC: 816 ng/mL (ref >498)
Hematocrit: 25.6 % — ABNORMAL LOW (ref 34.0–46.6)

## 2019-08-28 LAB — CBC WITH DIFFERENTIAL/PLATELET
Abs Immature Granulocytes: 0.03 10*3/uL (ref 0.00–0.07)
Basophils Absolute: 0 10*3/uL (ref 0.0–0.1)
Basophils Relative: 0 %
Eosinophils Absolute: 0.1 10*3/uL (ref 0.0–0.5)
Eosinophils Relative: 1 %
HCT: 28.8 % — ABNORMAL LOW (ref 36.0–46.0)
Hemoglobin: 9.6 g/dL — ABNORMAL LOW (ref 12.0–15.0)
Immature Granulocytes: 0 %
Lymphocytes Relative: 47 %
Lymphs Abs: 3.7 10*3/uL (ref 0.7–4.0)
MCH: 30.8 pg (ref 26.0–34.0)
MCHC: 33.3 g/dL (ref 30.0–36.0)
MCV: 92.3 fL (ref 80.0–100.0)
Monocytes Absolute: 0.7 10*3/uL (ref 0.1–1.0)
Monocytes Relative: 9 %
Neutro Abs: 3.3 10*3/uL (ref 1.7–7.7)
Neutrophils Relative %: 43 %
Platelets: 234 10*3/uL (ref 150–400)
RBC: 3.12 MIL/uL — ABNORMAL LOW (ref 3.87–5.11)
RDW: 14.2 % (ref 11.5–15.5)
WBC: 7.8 10*3/uL (ref 4.0–10.5)
nRBC: 0 % (ref 0.0–0.2)

## 2019-08-28 LAB — BASIC METABOLIC PANEL
Anion gap: 6 (ref 5–15)
BUN: 13 mg/dL (ref 6–20)
CO2: 26 mmol/L (ref 22–32)
Calcium: 8.4 mg/dL — ABNORMAL LOW (ref 8.9–10.3)
Chloride: 108 mmol/L (ref 98–111)
Creatinine, Ser: 0.85 mg/dL (ref 0.44–1.00)
GFR calc Af Amer: >60 mL/min (ref >60)
GFR calc non Af Amer: >60 mL/min (ref >60)
Glucose, Bld: 75 mg/dL (ref 70–99)
Potassium: 3.6 mmol/L (ref 3.5–5.1)
Sodium: 140 mmol/L (ref 135–145)

## 2019-08-28 LAB — IRON AND TIBC
Iron: 50 ug/dL (ref 28–170)
Saturation Ratios: 36 % — ABNORMAL HIGH (ref 10.4–31.8)
TIBC: 140 ug/dL — ABNORMAL LOW (ref 250–450)
UIBC: 90 ug/dL

## 2019-08-28 MED ORDER — PREGABALIN 75 MG PO CAPS
75.0000 mg | ORAL_CAPSULE | Freq: Three times a day (TID) | ORAL | Status: DC
  Administered 2019-08-28 – 2019-08-29 (×5): 75 mg via ORAL
  Filled 2019-08-28 (×5): qty 1

## 2019-08-28 MED ORDER — NITROGLYCERIN 0.4 MG SL SUBL
0.4000 mg | SUBLINGUAL_TABLET | SUBLINGUAL | Status: DC | PRN
  Administered 2019-08-28 (×2): 0.4 mg via SUBLINGUAL
  Filled 2019-08-28 (×2): qty 1

## 2019-08-28 MED ORDER — FERROUS SULFATE 325 (65 FE) MG PO TABS
325.0000 mg | ORAL_TABLET | Freq: Every day | ORAL | Status: DC
  Administered 2019-08-28: 325 mg via ORAL
  Filled 2019-08-28 (×2): qty 1

## 2019-08-28 MED ORDER — MORPHINE SULFATE (PF) 2 MG/ML IV SOLN
1.0000 mg | Freq: Once | INTRAVENOUS | Status: AC
  Administered 2019-08-28: 1 mg via INTRAVENOUS
  Filled 2019-08-28: qty 1

## 2019-08-28 NOTE — Progress Notes (Signed)
Pt ref to wear CPAP

## 2019-08-28 NOTE — Progress Notes (Signed)
MD, pt takes Lyrica 75 mg 3 x day for her fibromyalgia pain, please address, thanks Arvella Nigh RN.

## 2019-08-28 NOTE — Progress Notes (Signed)
Pt started having CP 9/10, MD notified, did ECG showed NSR, MD ordered MS IV 1mg  1x dose, once med was given pt went to a 7/10, will continue to monitor, Thanks, Arvella Nigh RN.

## 2019-08-28 NOTE — Consult Note (Signed)
Ref: Aletha Halim., PA-C   Subjective:  Awaiting official radiology reading of nuclear stress test but stress and rest images show inferolateral and apical area reversible ischemia with preserved LV systolic function. No chest pain. Chronic left lower leg discomfort. CBC, iron and TIBC are pending.  Objective:  Vital Signs in the last 24 hours: Temp:  [97.8 F (36.6 C)-98.8 F (37.1 C)] 98.1 F (36.7 C) (11/29 0331) Pulse Rate:  [66-79] 66 (11/29 0331) Cardiac Rhythm: Normal sinus rhythm (11/29 0700) Resp:  [18-20] 18 (11/29 0331) BP: (123-132)/(78-88) 126/79 (11/29 0331) SpO2:  [100 %] 100 % (11/29 0331) Weight:  [65.3 kg] 65.3 kg (11/29 0331)  Physical Exam: BP Readings from Last 1 Encounters:  08/28/19 126/79     Wt Readings from Last 1 Encounters:  08/28/19 65.3 kg    Weight change: 0.209 kg Body mass index is 29.08 kg/m. HEENT: [place]/AT, Eyes-Brown, PERL, EOMI, Conjunctiva-Pale, Sclera-Non-icteric Neck: No JVD, No bruit, Trachea midline. Lungs:  Clear, Bilateral. Cardiac:  Regular rhythm, normal S1 and S2, no S3. II/VI systolic murmur. Abdomen:  Soft, non-tender. BS present. Extremities:  No edema present. No cyanosis. No clubbing. CNS: AxOx3, Cranial nerves grossly intact, moves all 4 extremities.  Skin: Warm and dry.   Intake/Output from previous day: 11/28 0701 - 11/29 0700 In: 480 [P.O.:480] Out: 2550 [Urine:2550]    Lab Results: BMET    Component Value Date/Time   NA 140 08/28/2019 0502   NA 139 08/27/2019 0505   NA 141 08/26/2019 0406   K 3.6 08/28/2019 0502   K 4.3 08/27/2019 0505   K 4.2 08/26/2019 0406   CL 108 08/28/2019 0502   CL 108 08/27/2019 0505   CL 114 (H) 08/26/2019 0406   CO2 26 08/28/2019 0502   CO2 24 08/27/2019 0505   CO2 21 (L) 08/26/2019 0406   GLUCOSE 75 08/28/2019 0502   GLUCOSE 81 08/27/2019 0505   GLUCOSE 77 08/26/2019 0406   BUN 13 08/28/2019 0502   BUN 12 08/27/2019 0505   BUN 13 08/26/2019 0406   CREATININE 0.85  08/28/2019 0502   CREATININE 0.89 08/27/2019 0505   CREATININE 1.04 (H) 08/26/2019 0406   CALCIUM 8.4 (L) 08/28/2019 0502   CALCIUM 8.5 (L) 08/27/2019 0505   CALCIUM 8.4 (L) 08/26/2019 0406   GFRNONAA >60 08/28/2019 0502   GFRNONAA >60 08/27/2019 0505   GFRNONAA 59 (L) 08/26/2019 0406   GFRAA >60 08/28/2019 0502   GFRAA >60 08/27/2019 0505   GFRAA >60 08/26/2019 0406   CBC    Component Value Date/Time   WBC 6.3 08/26/2019 1357   RBC 2.87 (L) 08/26/2019 1357   HGB 8.9 (L) 08/26/2019 1357   HCT 26.9 (L) 08/26/2019 1357   PLT 228 08/26/2019 1357   MCV 93.7 08/26/2019 1357   MCH 31.0 08/26/2019 1357   MCHC 33.1 08/26/2019 1357   RDW 14.9 08/26/2019 1357   LYMPHSABS 4.4 (H) 04/26/2019 1719   MONOABS 0.8 04/26/2019 1719   EOSABS 0.1 04/26/2019 1719   BASOSABS 0.0 04/26/2019 1719   HEPATIC Function Panel Recent Labs    10/06/18 0154 03/09/19 0707  PROT 5.9* 4.9*   HEMOGLOBIN A1C No components found for: HGA1C,  MPG CARDIAC ENZYMES Lab Results  Component Value Date   CKTOTAL 198 (H) 07/08/2010   CKMB 2.7 07/08/2010   TROPONINI <0.03 03/14/2019   TROPONINI <0.03 03/14/2019   TROPONINI <0.03 03/13/2019   BNP No results for input(s): PROBNP in the last 8760 hours. TSH No  results for input(s): TSH in the last 8760 hours. CHOLESTEROL Recent Labs    08/25/19 1730  CHOL 101    Scheduled Meds: . aspirin EC  81 mg Oral Daily  . DULoxetine  60 mg Oral Daily  . enoxaparin (LOVENOX) injection  40 mg Subcutaneous Q24H  . folic acid  1 mg Oral Daily  . lidocaine  1 patch Transdermal Q24H  . metoprolol succinate  100 mg Oral QHS  . pantoprazole  40 mg Oral BID  . pregabalin  75 mg Oral TID   Continuous Infusions: PRN Meds:.acetaminophen, diclofenac Sodium, ondansetron (ZOFRAN) IV, oxyCODONE, polyethylene glycol  Assessment/Plan: Acute coronary syndrome HTN Hyperlipidemia H/O OSA Fibromyalgia Acute on chronic anemia Acute kidney failure, resolved GERD Lower  back pain with lumbar fusion in past  Recheck CBC, iron and TIBC. NPO post mid-night for possible cardiac catheterization.   LOS: 3 days   Time spent including chart review, lab review, examination, discussion with patient :  min   Dixie Dials  MD  08/28/2019, 10:23 AM

## 2019-08-28 NOTE — Progress Notes (Signed)
Paged that patient having chest pain.  Does not describe as "crushing" pain.  Notes that she is having back pani as well that is her chronic pain from fibromyalgia.  Will order morphine 1mg  and stat EKG.  Continue to monitor.

## 2019-08-28 NOTE — Progress Notes (Signed)
Family Medicine Teaching Service Daily Progress Note Intern Pager: [phone]  Patient name: Jodi Bowman record number: [id number] Date of birth: [birth year] Age: 58 y.o. Gender: female  Primary Care Provider: Aletha Halim., PA-C Consultants: Cardiology Code Status: Full  Pt Overview and Major Events to Date:  11/26: Admitted for ACS rule out 11/28: Myoview  Assessment and Plan: Jodi Bowman is a 58 y.o. female presenting with chest pain. PMH is significant for migraines, HTN, fibromyalgia, h/o gastric bypass (2018), GERD, HLD, h/o CVA and seasonal allergies.  Inferior, lateral wall Ischemia Myoview on 11/28 demonstrated reversible apical, inferior, lateral wall ischemia with preserved LV systolic function.  Plan per cardiology is to undergo cath on 11/29.  Patient with intermittent continued chest pain, currently getting oxycodone 5 mg every 6 hours as needed.  Restarted home Lyrica 75 mg 3 times daily for chronic pain.  If continues have chest pain will likely give nitro SL to augment current therapy. -Cardiology following appreciate their recommendations -Cardiac cath 11/30, n.p.o. at midnight -Continuous cardiac monitoring -Oxycodone 5 mg every 6 as needed -Metoprolol XR 100 mg every 24 hours -Lidoderm patch daily -Lyrica 75 mg 3 times daily -Lovenox 40 mg for DVT prophylaxis -Tylenol 650 mg every 4 hours.  AKI (resolved) Creatinine 0.85 this a.m., patient is back to baseline.  Problem is now resolved.  Patient is still normotensive at this point, so we will continue to hold losartan and amiloride.  We can restart these if she becomes hypotensive. -Encourage p.o. intake -Continue to hold losartan, amiloride-> can restart if becomes hypertensive  Normocytic Anemia Hemoglobin on admission 9.7, this a.m. is 8.9.  Baseline appears to be between 9 and 11.   MCV 93.  Takes ferrous sulfate 325 mg daily, resume on discharge.  Iron panel ordered by cardiology, will  follow up. -Follow-up iron panel -Resume ferrous sulfate on discharge -A.m. CBC  HTN Most recent BP 123/78.  We will continue holding home losartan and amiloride.  Taking metoprolol daily.  Next -can continue losartan amiloride if becomes hypotensive -Continue metoprolol XL 100 mg daily  Sleep apnea Patient has CPAP at home which she reports she does not use because she reports she had sleep apnea prior to her weight loss surgery but since that time has not used it. -CPAP in hospital  Reactive airway disease Patient has home albuterol for episodes of shortness of breath. -Continue home albuterol PRN  Fibromyalgia  Chronic back pain Patient is prescribed Cymbalta for fibromyalgia.  She also receives hydrocodone which she takes as needed.  Patient reports she uses hydrocodone "maybe twice a week". Patient also with h/o L4-5/L5-S1 decompressive laminectomy and foraminotomies with posterior lumbar interbody fusion on 03/04/2019.  -Continue Cymbalta 60mg  - will add on lyrica 75mg  tid, which is her home regimen  Peripheral neuropathy Patient is prescribed pregabalin with his peripheral numbness. - continue home lyrica  GERD Patient takes pantoprazole 40 mg daily. -Continue home medications  Seasonal allergies Patient takes Flonase and Claritin daily for allergies. -Continue Flonase daily  FEN/GI: Heart healthy/ carb modified Prophylaxis: Lovenox  Disposition: Likely home pending work-up  Subjective:  Pain controlled this am. Says it is a 3/10. Patient with some mild anxiety regarding cath.  Objective: Temp:  [97.8 F (36.6 C)-98.8 F (37.1 C)] 98.1 F (36.7 C) (11/29 0331) Pulse Rate:  [66-79] 66 (11/29 0331) Resp:  [18-20] 18 (11/29 0331) BP: (123-132)/(78-88) 126/79 (11/29 0331) SpO2:  [100 %] 100 % (11/29 0331) Weight:  [65.3  kg] 65.3 kg (11/29 0331) Physical Exam: General: 57 year old AA , no acute distress, very pleasant Cardiovascular: rrr, no m/r/g. No  tenderness to palpation in chest Respiratory: Lungs CTAB, no accessory muscle use MSK: moves 4 extremities equally Neuro: No focal neuro deficits Psych: Pleasant, coherent thought process, appropriate  Laboratory: Recent Labs  Lab 08/25/19 1113 08/26/19 0406 08/26/19 1357  WBC 8.6 6.4 6.3  HGB 9.7* 8.2* 8.9*  HCT 30.8* 25.6* 26.9*  PLT 224 212 228   Recent Labs  Lab 08/26/19 0406 08/27/19 0505 08/28/19 0502  NA 141 139 140  K 4.2 4.3 3.6  CL 114* 108 108  CO2 21* 24 26  BUN 13 12 13   CREATININE 1.04* 0.89 0.85  CALCIUM 8.4* 8.5* 8.4*  GLUCOSE 77 81 75   Lipid Panel     Component Value Date/Time   CHOL 101 08/25/2019 1730   TRIG 50 08/25/2019 1730   HDL 59 08/25/2019 1730   CHOLHDL 1.7 08/25/2019 1730   VLDL 10 08/25/2019 1730   LDLCALC 32 08/25/2019 1730    Imaging/Diagnostic Tests: No results found.   Guadalupe Dawn, MD 08/28/2019, 10:41 AM PGY-3, [place] Intern pager: [phone], text pages welcome

## 2019-08-28 NOTE — Plan of Care (Signed)

## 2019-08-28 NOTE — Progress Notes (Signed)
Physical Therapy Treatment and Discharge Patient Details Name: Jodi Bowman MRN: [id number] DOB: 1961-04-06 Today's Date: 08/28/2019    History of Present Illness 58 y.o. female presenting with chest pain. PMH is significant for migraines, HTN, fibromyalgia, GERD, HLD,and  seasonal allergies. Chest pain worsens with mobility in bed.    PT Comments    Patient mobilizing well and is at her baseline. All goals met and patient is discharged from PT.    Follow Up Recommendations  No PT follow up;Supervision/Assistance - 24 hour(Pt reports she is near her baseline)     Equipment Recommendations  None recommended by PT    Recommendations for Other Services       Precautions / Restrictions Precautions Precautions: Fall Restrictions Weight Bearing Restrictions: No    Mobility  Bed Mobility Overal bed mobility: Modified Independent Bed Mobility: Supine to Sit     Supine to sit: HOB elevated;Modified independent (Device/Increase time)        Transfers Overall transfer level: Needs assistance Equipment used: Rolling walker (2 wheeled) Transfers: Sit to/from Stand Sit to Stand: Supervision         General transfer comment: vc for proper hand placement, safe use of RW; from bed and toilet  Ambulation/Gait Ambulation/Gait assistance: Min guard Gait Distance (Feet): 150 Feet(20) Assistive device: Rolling walker (2 wheeled) Gait Pattern/deviations: Step-to pattern;Trunk flexed;Decreased stance time - right;Decreased step length - right Gait velocity: reduced   General Gait Details: Patient with mild dyspnea with standing rest x 2; chest pain incr from 3 to 5 as returning to her room   Stairs             Wheelchair Mobility    Modified Rankin (Stroke Patients Only)       Balance Overall balance assessment: Needs assistance Sitting-balance support: No upper extremity supported;Feet supported Sitting balance-Leahy Scale: Good     Standing balance  support: No upper extremity supported Standing balance-Leahy Scale: Fair Standing balance comment: able to maintain static standing during ADL tasks without UE support                            Cognition Arousal/Alertness: Awake/alert Behavior During Therapy: WFL for tasks assessed/performed Overall Cognitive Status: Within Functional Limits for tasks assessed                                        Exercises      General Comments        Pertinent Vitals/Pain Pain Assessment: 0-10 Pain Score: 3  Pain Location: chest, back Pain Descriptors / Indicators: Pressure Pain Intervention(s): Limited activity within patient's tolerance;Monitored during session;Other (comment)(RN ok with pt getting up)    Home Living                      Prior Function            PT Goals (current goals can now be found in the care plan section) Acute Rehab PT Goals Patient Stated Goal: To reduce chest pain PT Goal Formulation: With patient Time For Goal Achievement: 09/09/19 Potential to Achieve Goals: Good Progress towards PT goals: Goals met/education completed, patient discharged from PT    Frequency    Min 3X/week      PT Plan Current plan remains appropriate    Co-evaluation  AM-PAC PT "6 Clicks" Mobility   Outcome Measure  Help needed turning from your back to your side while in a flat bed without using bedrails?: A Little Help needed moving from lying on your back to sitting on the side of a flat bed without using bedrails?: A Little Help needed moving to and from a bed to a chair (including a wheelchair)?: A Little Help needed standing up from a chair using your arms (e.g., wheelchair or bedside chair)?: A Little Help needed to walk in hospital room?: A Little Help needed climbing 3-5 steps with a railing? : Total 6 Click Score: 16    End of Session   Activity Tolerance: Patient limited by fatigue;Treatment limited  secondary to medical complications (Comment) Patient left: with call bell/phone within reach;in chair   PT Visit Diagnosis: Muscle weakness (generalized) (M62.81);Pain Pain - Right/Left: (chest) Pain - part of body: (chest)     Time: 5498-2641 PT Time Calculation (min) (ACUTE ONLY): 31 min  Charges:  $Gait Training: 23-37 mins                    PT Discharge Note  Patient is being discharged from PT services secondary to:  Marland Kitchen Goals met and no further therapy needs identified.  Please see latest Therapy Progress Note for current level of functioning and progress toward goals.  Progress and discharge plan and discussed with patient/caregiver and they  . Agree   Barry Brunner, PT Pager [phone]    Jodi Bowman 08/28/2019, 11:46 AM

## 2019-08-29 ENCOUNTER — Encounter (HOSPITAL_COMMUNITY)
Admission: EM | Disposition: A | Discharge status: Home / Self Care | Payer: Self-pay | Source: Home / Self Care | Attending: Family Medicine

## 2019-08-29 HISTORY — PX: LEFT HEART CATH AND CORONARY ANGIOGRAPHY: CATH118249

## 2019-08-29 LAB — BASIC METABOLIC PANEL
Anion gap: 5 (ref 5–15)
BUN: 16 mg/dL (ref 6–20)
CO2: 28 mmol/L (ref 22–32)
Calcium: 8.3 mg/dL — ABNORMAL LOW (ref 8.9–10.3)
Chloride: 107 mmol/L (ref 98–111)
Creatinine, Ser: 0.92 mg/dL (ref 0.44–1.00)
GFR calc Af Amer: >60 mL/min (ref >60)
GFR calc non Af Amer: >60 mL/min (ref >60)
Glucose, Bld: 79 mg/dL (ref 70–99)
Potassium: 4.3 mmol/L (ref 3.5–5.1)
Sodium: 140 mmol/L (ref 135–145)

## 2019-08-29 LAB — PROTIME-INR
INR: 1.1 (ref 0.8–1.2)
Prothrombin Time: 13.8 seconds (ref 11.4–15.2)

## 2019-08-29 LAB — CBC
HCT: 28.7 % — ABNORMAL LOW (ref 36.0–46.0)
Hemoglobin: 9.4 g/dL — ABNORMAL LOW (ref 12.0–15.0)
MCH: 30.5 pg (ref 26.0–34.0)
MCHC: 32.8 g/dL (ref 30.0–36.0)
MCV: 93.2 fL (ref 80.0–100.0)
Platelets: 234 10*3/uL (ref 150–400)
RBC: 3.08 MIL/uL — ABNORMAL LOW (ref 3.87–5.11)
RDW: 14.4 % (ref 11.5–15.5)
WBC: 7.9 10*3/uL (ref 4.0–10.5)
nRBC: 0 % (ref 0.0–0.2)

## 2019-08-29 SURGERY — LEFT HEART CATH AND CORONARY ANGIOGRAPHY
Anesthesia: LOCAL

## 2019-08-29 MED ORDER — SODIUM CHLORIDE 0.9 % IV SOLN
INTRAVENOUS | Status: DC
  Administered 2019-08-29: 07:00:00 via INTRAVENOUS

## 2019-08-29 MED ORDER — DIPHENHYDRAMINE HCL 50 MG/ML IJ SOLN
INTRAMUSCULAR | Status: AC
  Filled 2019-08-29: qty 1

## 2019-08-29 MED ORDER — SODIUM CHLORIDE 0.9 % IV SOLN
INTRAVENOUS | Status: AC | PRN
  Administered 2019-08-29: 250 mL via INTRAVENOUS

## 2019-08-29 MED ORDER — FENTANYL CITRATE (PF) 100 MCG/2ML IJ SOLN
INTRAMUSCULAR | Status: DC | PRN
  Administered 2019-08-29: 25 ug via INTRAVENOUS

## 2019-08-29 MED ORDER — FAMOTIDINE IN NACL 20-0.9 MG/50ML-% IV SOLN
INTRAVENOUS | Status: AC | PRN
  Administered 2019-08-29: 20 mg via INTRAVENOUS

## 2019-08-29 MED ORDER — FENTANYL CITRATE (PF) 100 MCG/2ML IJ SOLN
INTRAMUSCULAR | Status: AC
  Filled 2019-08-29: qty 2

## 2019-08-29 MED ORDER — SODIUM CHLORIDE 0.9% FLUSH: 3.0000 mL | Freq: Two times a day (BID) | INTRAVENOUS | Status: DC

## 2019-08-29 MED ORDER — SODIUM CHLORIDE 0.9 % WEIGHT BASED INFUSION
1.0000 mL/kg/h | INTRAVENOUS | Status: DC
  Administered 2019-08-29: 1 mL/kg/h via INTRAVENOUS

## 2019-08-29 MED ORDER — HEPARIN (PORCINE) IN NACL 1000-0.9 UT/500ML-% IV SOLN
INTRAVENOUS | Status: AC
  Filled 2019-08-29: qty 1000

## 2019-08-29 MED ORDER — MIDAZOLAM HCL 2 MG/2ML IJ SOLN
INTRAMUSCULAR | Status: DC | PRN
  Administered 2019-08-29: 1 mg via INTRAVENOUS

## 2019-08-29 MED ORDER — METHYLPREDNISOLONE SODIUM SUCC 125 MG IJ SOLR
INTRAMUSCULAR | Status: AC
  Filled 2019-08-29: qty 2

## 2019-08-29 MED ORDER — DIPHENHYDRAMINE HCL 50 MG/ML IJ SOLN
INTRAMUSCULAR | Status: DC | PRN
  Administered 2019-08-29: 25 mg via INTRAVENOUS

## 2019-08-29 MED ORDER — METHYLPREDNISOLONE SODIUM SUCC 125 MG IJ SOLR
INTRAMUSCULAR | Status: DC | PRN
  Administered 2019-08-29: 125 mg via INTRAVENOUS

## 2019-08-29 MED ORDER — ONDANSETRON HCL 4 MG/2ML IJ SOLN
4.0000 mg | Freq: Four times a day (QID) | INTRAMUSCULAR | Status: DC | PRN
  Administered 2019-08-29: 4 mg via INTRAVENOUS
  Filled 2019-08-29: qty 2

## 2019-08-29 MED ORDER — SODIUM CHLORIDE 0.9 % IV SOLN: 250.0000 mL | INTRAVENOUS | Status: DC | PRN

## 2019-08-29 MED ORDER — ACETAMINOPHEN 325 MG PO TABS: 650.0000 mg | ORAL_TABLET | ORAL | Status: DC | PRN

## 2019-08-29 MED ORDER — HEPARIN (PORCINE) IN NACL 1000-0.9 UT/500ML-% IV SOLN
INTRAVENOUS | Status: DC | PRN
  Administered 2019-08-29 (×2): 500 mL

## 2019-08-29 MED ORDER — PANTOPRAZOLE SODIUM 40 MG PO TBEC: 40.0000 mg | DELAYED_RELEASE_TABLET | Freq: Every day | ORAL | Status: DC

## 2019-08-29 MED ORDER — IOHEXOL 350 MG/ML SOLN
INTRAVENOUS | Status: DC | PRN
  Administered 2019-08-29: 70 mL

## 2019-08-29 MED ORDER — LIDOCAINE HCL (PF) 1 % IJ SOLN
INTRAMUSCULAR | Status: DC | PRN
  Administered 2019-08-29: 10 mL

## 2019-08-29 MED ORDER — MIDAZOLAM HCL 2 MG/2ML IJ SOLN
INTRAMUSCULAR | Status: AC
  Filled 2019-08-29: qty 2

## 2019-08-29 MED ORDER — FAMOTIDINE IN NACL 20-0.9 MG/50ML-% IV SOLN
INTRAVENOUS | Status: AC
  Filled 2019-08-29: qty 50

## 2019-08-29 MED ORDER — LIDOCAINE HCL (PF) 1 % IJ SOLN
INTRAMUSCULAR | Status: AC
  Filled 2019-08-29: qty 30

## 2019-08-29 MED ORDER — SODIUM CHLORIDE 0.9% FLUSH: 3.0000 mL | INTRAVENOUS | Status: DC | PRN

## 2019-08-29 SURGICAL SUPPLY — 8 items
CATH DXT MULTI JL4 JR4 ANG PIG (CATHETERS) ×2 IMPLANT
CATH INFINITI 5 FR JR3.5 (CATHETERS) ×2 IMPLANT
KIT HEART LEFT (KITS) ×2 IMPLANT
PACK CARDIAC CATHETERIZATION (CUSTOM PROCEDURE TRAY) ×2 IMPLANT
SHEATH PINNACLE 5F 10CM (SHEATH) ×2 IMPLANT
SYR MEDRAD MARK 7 150ML (SYRINGE) ×2 IMPLANT
TRANSDUCER W/STOPCOCK (MISCELLANEOUS) ×2 IMPLANT
WIRE EMERALD 3MM-J .035X150CM (WIRE) ×2 IMPLANT

## 2019-08-29 NOTE — Progress Notes (Signed)
Family medicine progress update  Discussed case with Dr. Terrence Dupont, who performed catheterization today.  Stated that there was one small area with 10 to 15% stenosis, but otherwise was a very clean cath.  Recommended medical therapy and discharge later in the day.  Checked patient's access site roughly 3 hours after cath with site clean dry and intact.  Will discharge.  We will continue to hold patient's ARB and potassium sparing diuretic at this time, can follow-up in 1 to 2 weeks with PCP or cardiology to see about restarting this.  Discharge summary to follow  Guadalupe Dawn MD PGY-3 Family Medicine Resident

## 2019-08-29 NOTE — TOC Initial Note (Signed)
Transition of Care [facility]) - Initial/Assessment Note    Patient Details  Name: Jodi Bowman MRN: [id number] Date of Birth: 1961/04/06  Transition of Care [facility]) CM/SW Contact:    Zenon Mayo, RN Phone Number: 08/29/2019, 6:54 PM  Clinical Narrative:                 From home with spouse, she states she has no issues with getting medications and she has transport at dc. She has no other needs.  Expected Discharge Plan: Home/Self Care Barriers to Discharge: No Barriers Identified   Patient Goals and CMS Choice Patient states their goals for this hospitalization and ongoing recovery are:: feel better   Choice offered to / list presented to : NA  Expected Discharge Plan and Services Expected Discharge Plan: Home/Self Care   Discharge Planning Services: CM Consult Post Acute Care Choice: NA Living arrangements for the past 2 months: Single Family Home Expected Discharge Date: 08/29/19                                    Prior Living Arrangements/Services Living arrangements for the past 2 months: Single Family Home Lives with:: Spouse Patient language and need for interpreter reviewed:: Yes Do you feel safe going back to the place where you live?: Yes      Need for Family Participation in Patient Care: No (Comment) Care giver support system in place?: No (comment)   Criminal Activity/Legal Involvement Pertinent to Current Situation/Hospitalization: No - Comment as needed  Activities of Daily Living Home Assistive Devices/Equipment: None ADL Screening (condition at time of admission) Patient's cognitive ability adequate to safely complete daily activities?: Yes Is the patient deaf or have difficulty hearing?: No Does the patient have difficulty seeing, even when wearing glasses/contacts?: No Does the patient have difficulty concentrating, remembering, or making decisions?: No Patient able to express need for assistance with ADLs?: No Does the patient  have difficulty dressing or bathing?: No Independently performs ADLs?: No Does the patient have difficulty walking or climbing stairs?: No Weakness of Legs: Both Weakness of Arms/Hands: None  Permission Sought/Granted                  Emotional Assessment       Orientation: : Oriented to Self, Oriented to Place, Oriented to  Time, Oriented to Situation      Admission diagnosis:  Hypotension, unspecified hypotension type [I95.9] Chest pain, unspecified type [R07.9] Patient Active Problem List   Diagnosis Date Noted  . ACS (acute coronary syndrome) ([place]) 08/25/2019  . Chest pain 08/25/2019  . Postoperative seroma involving nervous system after nervous system procedure 04/05/2019  . Drainage from wound 04/02/2019  . Infective otitis externa of left ear   . Left otitis media   . AKI (acute kidney injury) ([place])   . Urinary retention   . Orthostasis   . Acute blood loss anemia   . Fibromyalgia   . Chronic pain syndrome   . Lumbar radiculopathy 03/08/2019  . Orthostatic hypotension 03/06/2019  . Postoperative urinary retention 03/06/2019  . Lumbar foraminal stenosis 03/04/2019  . S/P exploratory laparotomy 03/06/2018  . Bowel obstruction ([place]) 03/06/2018  . Chest pain, rule out acute myocardial infarction 12/17/2017  . Hypokalemia 12/17/2017  . Acute lower UTI 12/17/2017  . Vertigo 04/30/2017  . Small vessel disease, cerebrovascular 04/30/2017  . Chronic low back pain 08/01/2015  . Right hip pain 08/01/2015  .  Abnormality of gait 08/01/2015  . Left leg weakness   . Low back pain with radiation   . Syncope 07/30/2014  . Atypical chest pain 03/12/2014  . Lap chole [facility] April 2013 01/29/2012  . Gallstones 12/04/2011  . Thyroid nodule-non neoplastic goiter by needle aspiration 12/04/2011  . GLUCOSE INTOLERANCE 03/12/2010  . DYSLIPIDEMIA 03/12/2010  . Chronic migraine 03/12/2010  . Carotid stenosis 03/12/2010  . CEREBROVASCULAR ACCIDENT 03/12/2010  . LIPOMA  01/29/2010  . HEADACHE 01/29/2010  . ANKLE INJURY, RIGHT 04/12/2009  . PHARYNGITIS 03/21/2009  . Backache 03/01/2009  . ALLERGIC RHINITIS 03/17/2007  . LOW BACK PAIN 03/17/2007  . Essential hypertension 01/01/2007  . ANXIETY STATE NOS 09/11/2005   PCP:  Aletha Halim., PA-C Pharmacy:   [id number] - [place], [place] [id number] [place] [id number] Phone: [phone] Fax: [phone]     Social Determinants of Health (SDOH) Interventions    Readmission Risk Interventions Readmission Risk Prevention Plan 08/29/2019 03/08/2019  Transportation Screening Complete Complete  PCP or Specialist Appt within 5-7 Days - Complete  PCP or Specialist Appt within 3-5 Days Complete -  Home Care Screening - Complete  Medication Review (RN CM) - Complete  HRI or Home Care Consult Complete -  Social Work Consult for Recovery Care Planning/Counseling Complete -  Palliative Care Screening Complete -  Medication Review Press photographer) Complete -  Some recent data might be hidden

## 2019-08-29 NOTE — Progress Notes (Signed)
OT Cancellation Note  Patient Details Name: Jodi Bowman MRN: [id number] DOB: [birth year]   Cancelled Treatment:    Reason Eval/Treat Not Completed: Patient at procedure or test/ unavailable. Will follow.  Delight Stare, OT Acute Rehabilitation Services Pager [phone] Office [phone]   Delight Stare 08/29/2019, 1:35 PM

## 2019-08-29 NOTE — Progress Notes (Signed)
Family Medicine Teaching Service Daily Progress Note Intern Pager: [phone]  Patient name: Jodi Bowman record number: [id number] Date of birth: [birth year] Age: 58 y.o. Gender: female  Primary Care Provider: Aletha Halim., PA-C Consultants: Cardiology Code Status: Full  Pt Overview and Major Events to Date:  11/26: Admitted for ACS rule out 11/28: Myoview with reversible ischemia 11/30: Heart Cath  Assessment and Plan: Jodi Bowman is a 58 y.o. female presenting with chest pain. PMH is significant for migraines, HTN, fibromyalgia, h/o gastric bypass (2018), GERD, HLD, h/o CVA and seasonal allergies.  Inferior, lateral wall Ischemia Patient for heart cath today. Myoview on 11/28 demonstrated reversible apical, inferior, lateral wall ischemia with preserved LV systolic function.  Patient with intermittent continued chest pain, currently getting oxycodone 5 mg every 6 hours as needed.  Restarted home Lyrica 75 mg 3 times daily for chronic pain.  If continues have chest pain will likely give nitro SL to augment current therapy. -Cardiology following appreciate their recommendations -Cardiac cath 11/30, n.p.o. at midnight -Continuous cardiac monitoring -Oxycodone 5 mg every 6 as needed -Metoprolol XR 100 mg every 24 hours -Lidoderm patch daily -Lyrica 75 mg 3 times daily -Lovenox 40 mg for DVT prophylaxis -Tylenol 650 mg every 4 hours.  AKI (resolved) Creatinine 0.92 this a.m., patient is back to baseline.  Problem is now resolved.  Patient is still normotensive at this point, so we will continue to hold losartan and amiloride.  We can restart these if she becomes hypotensive. -Encourage p.o. intake -Continue to hold losartan, amiloride-> can restart if becomes hypertensive  Normocytic Anemia Hemoglobin on admission 9.7, this a.m. is 9.4.  Baseline appears to be between 9 and 11.   MCV 93.  Takes ferrous sulfate 325 mg daily, resume on discharge.  Iron panel  ordered by cardiology, will follow up. -Follow-up iron panel -Resume ferrous sulfate on discharge -A.m. CBC  HTN Most recent BP 103/61.  We will continue holding home losartan and amiloride.  Taking metoprolol daily.   -Continue to hold losartan and amiloride, can restart if becomes hypotensive. -Continue metoprolol XL 100 mg daily  Sleep apnea Patient has CPAP at home which she reports she does not use because she reports she had sleep apnea prior to her weight loss surgery but since that time has not used it. -CPAP in hospital  Reactive airway disease Patient has home albuterol for episodes of shortness of breath. -Continue home albuterol PRN  Fibromyalgia  Chronic back pain Patient is prescribed Cymbalta for fibromyalgia.  She also receives hydrocodone which she takes as needed.  Patient reports she uses hydrocodone "maybe twice a week". Patient also with h/o L4-5/L5-S1 decompressive laminectomy and foraminotomies with posterior lumbar interbody fusion on 03/04/2019.  -Continue Cymbalta 60mg  - will add on lyrica 75mg  tid, which is her home regimen  Peripheral neuropathy Patient is prescribed pregabalin with his peripheral numbness. - continue home lyrica  GERD Patient takes pantoprazole 40 mg twice daily. Will decrease to daily and monitor, recommend decreasing PPI or switching to nexium in discharge summary. Pt has h/o gastric bypass. -Continue home medications  Seasonal allergies Patient takes Flonase and Claritin daily for allergies. -Continue Flonase daily  FEN/GI: Heart healthy/ carb modified Prophylaxis: Lovenox  Disposition: Likely home pending work-up  Subjective:  Pt did not have CP this AM. Awaiting cath today, a little anxious.  Objective: Temp:  [98.4 F (36.9 C)-98.5 F (36.9 C)] 98.5 F (36.9 C) (11/30 0515) Pulse Rate:  [66-82] 66 (  11/30 0515) Resp:  [16-20] 19 (11/30 0515) BP: (95-133)/(54-74) 103/61 (11/30 0515) SpO2:  [96 %-100 %] 96 %  (11/30 0515) Weight:  [66.1 kg] 66.1 kg (11/30 0558) Physical Exam: General: 58 year old AA , no acute distress, very pleasant Cardiovascular: rrr, no m/r/g. No tenderness to palpation in chest Respiratory: Lungs CTAB, no accessory muscle use MSK: moves 4 extremities equally Neuro: No focal neuro deficits Psych: Pleasant, coherent thought process, appropriate, a little anxious  Laboratory: Recent Labs  Lab 08/26/19 1357 08/28/19 1142 08/29/19 0521  WBC 6.3 7.8 7.9  HGB 8.9* 9.6* 9.4*  HCT 26.9* 28.8* 28.7*  PLT 228 234 234   Recent Labs  Lab 08/27/19 0505 08/28/19 0502 08/29/19 0521  NA 139 140 140  K 4.3 3.6 4.3  CL 108 108 107  CO2 24 26 28   BUN 12 13 16   CREATININE 0.89 0.85 0.92  CALCIUM 8.5* 8.4* 8.3*  GLUCOSE 81 75 79   Lipid Panel     Component Value Date/Time   CHOL 101 08/25/2019 1730   TRIG 50 08/25/2019 1730   HDL 59 08/25/2019 1730   CHOLHDL 1.7 08/25/2019 1730   VLDL 10 08/25/2019 1730   LDLCALC 32 08/25/2019 1730    Imaging/Diagnostic Tests: No results found.   Gladys Damme, MD 08/29/2019, 8:01 AM PGY-1, [place] Intern pager: [phone], text pages welcome

## 2019-08-29 NOTE — Progress Notes (Signed)
Site area- right  Site Prior to Removal- 0   Pressure Applied For-  20 MInutes   Bedrest Beginning at - 1320   Manual- Yes   Patient Status During Pull- Stable    Post Pull Groin Site- 0   Post Pull Instructions Given- Yes   Post Pull Pulses Present- Yes    Dressing Applied- Tegaderm and Gauze Dressing    Comments:  tol v well. Site cdi

## 2019-08-29 NOTE — Plan of Care (Signed)
  Problem: Nutrition: Goal: Adequate nutrition will be maintained Outcome: Completed/Met   Problem: Elimination: Goal: Will not experience complications related to bowel motility Outcome: Completed/Met Goal: Will not experience complications related to urinary retention Outcome: Completed/Met

## 2019-08-29 NOTE — Plan of Care (Signed)
  Problem: Nutrition: Goal: Adequate nutrition will be maintained Outcome: Completed/Met   Problem: Elimination: Goal: Will not experience complications related to bowel motility Outcome: Completed/Met Goal: Will not experience complications related to urinary retention Outcome: Completed/Met   Problem: Safety: Goal: Ability to remain free from injury will improve 08/29/2019 0447 by Ruben Im, RN Outcome: Completed/Met 08/29/2019 0219 by Ruben Im, RN Outcome: Progressing   Problem: Skin Integrity: Goal: Risk for impaired skin integrity will decrease 08/29/2019 0447 by Ruben Im, RN Outcome: Completed/Met 08/29/2019 0219 by Ruben Im, RN Outcome: Progressing

## 2019-08-29 NOTE — Progress Notes (Signed)
Subjective:  Patient presently denies any chest pain or shortness of breath.  Nuclear stress test showed moderate size reversible defect in the inferior and inferolateral wall from mid wall to the base and also small reversible defect in the apex EF of 61% noninvasive risk stratification highly discussed above findings with the patient and left cardiac catheterization possible PCI its risk and benefits and agrees for the procedure.  Objective:  Vital Signs in the last 24 hours: Temp:  [98.4 F (36.9 C)-99 F (37.2 C)] 99 F (37.2 C) (11/30 0933) Pulse Rate:  [66-82] 68 (11/30 0933) Resp:  [19-20] 19 (11/30 0515) BP: (95-133)/(54-74) 117/68 (11/30 0933) SpO2:  [96 %-100 %] 100 % (11/30 0933) Weight:  [66.1 kg] 66.1 kg (11/30 0558)  Intake/Output from previous day: 11/29 0701 - 11/30 0700 In: 1090 [P.O.:1090] Out: 1350 [Urine:1350] Intake/Output from this shift: Total I/O In: 120 [P.O.:120] Out: 500 [Urine:500]  Physical Exam: Exam unchanged  Lab Results: Recent Labs    08/28/19 1142 08/29/19 0521  WBC 7.8 7.9  HGB 9.6* 9.4*  PLT 234 234   Recent Labs    08/28/19 0502 08/29/19 0521  NA 140 140  K 3.6 4.3  CL 108 107  CO2 26 28  GLUCOSE 75 79  BUN 13 16  CREATININE 0.85 0.92   No results for input(s): TROPONINI in the last 72 hours.  Invalid input(s): CK, MB Hepatic Function Panel No results for input(s): PROT, ALBUMIN, AST, ALT, ALKPHOS, BILITOT, BILIDIR, IBILI in the last 72 hours. No results for input(s): CHOL in the last 72 hours. No results for input(s): PROTIME in the last 72 hours.  Imaging: Imaging results have been reviewed and Nm Myocar Multi W/spect W/wall Motion / Ef  Result Date: 08/28/2019 CLINICAL DATA:  Chest pain. EXAM: MYOCARDIAL IMAGING WITH SPECT (REST AND PHARMACOLOGIC-STRESS) GATED LEFT VENTRICULAR WALL MOTION STUDY LEFT VENTRICULAR EJECTION FRACTION TECHNIQUE: Standard myocardial SPECT imaging was performed after resting intravenous  injection of 10.4 mCi Tc-35m tetrofosmin. Subsequently, intravenous infusion of Lexiscan was performed under the supervision of the Cardiology staff. At peak effect of the drug, 31 mCi Tc-53m tetrofosmin was injected intravenously and standard myocardial SPECT imaging was performed. Quantitative gated imaging was also performed to evaluate left ventricular wall motion, and estimate left ventricular ejection fraction. COMPARISON:  None. FINDINGS: Perfusion: There is a defect involving most of the septal wall which is largely fixed. However, there is a small region of reversibility towards the apex. There is a moderate sized reversible defect in the inferior and inferolateral wall from mid wall to base. No other perfusion abnormalities identified. Wall Motion: Poor thickening in the septal wall. No other wall motion abnormalities. Left Ventricular Ejection Fraction: 61 % End diastolic volume 49 ml End systolic volume 19 ml IMPRESSION: 1. There is a large fixed defect involving the septal wall consistent with infarct with a small region of reversibility at the apex. There is also a moderate size reversible defect in the inferior and inferolateral walls from mid wall to base. 2. Poor thickening of the septal wall. No other wall motion abnormalities. 3. Left ventricular ejection fraction 61% 4. Non invasive risk stratification*: High *2012 Appropriate Use Criteria for Coronary Revascularization Focused Update: J Am Coll Cardiol. [id number]. http://content.airportbarriers.com.aspx?articleid=[email or web address] Electronically Signed   By: Dorise Bullion III M.D   On: 08/28/2019 12:12    Cardiac Studies:  Assessment/Plan:  Acute coronary syndrome/abnormal nuclear stress test Hypertension Hyperlipidemia History of morbid obesity status post gastric bypass surgery History of  obstructive sleep apnea Fibromyalgia History of CVA in remote past Depression Acute on chronic anemia, probably secondary to hydration and  rule out GI loss Acute kidney injury probably secondary to nonsteroidal anti-inflammatory meds.improved Depression GERD Degenerative joint disease status post lumbar fusion in the past Plan Discussed with patient regarding left cath possible PTCA stenting its risk and benefits i.e. death MI stroke need for emergency CABG local vascular complications etc. and consents for PCI  LOS: 3 days    Charolette Forward 08/29/2019, 11:51 AM

## 2019-08-29 NOTE — Discharge Instructions (Signed)
While in the hospital you were treated for chest pain. A nuclear stress test indicated reversible ischemia, however, on heart catheterization today, your arteries were in good condition.    Please follow up with your primary care provider within 2 weeks to determine if you need to restart 2 high blood pressure medications: amlodipine and losartan. Your blood pressure was low to normal during your stay here, so these medications were held.  Please follow up with cardiology in 1-3 weeks.   Femoral Site Care This sheet gives you information about how to care for yourself after your procedure. Your health care provider may also give you more specific instructions. If you have problems or questions, contact your health care provider. What can I expect after the procedure? After the procedure, it is common to have:  Bruising that usually fades within 1-2 weeks.  Tenderness at the site. Follow these instructions at home: Wound care  Follow instructions from your health care provider about how to take care of your insertion site. Make sure you: ? Wash your hands with soap and water before you change your bandage (dressing). If soap and water are not available, use hand sanitizer. ? Change your dressing as told by your health care provider. ? Leave stitches (sutures), skin glue, or adhesive strips in place. These skin closures may need to stay in place for 2 weeks or longer. If adhesive strip edges start to loosen and curl up, you may trim the loose edges. Do not remove adhesive strips completely unless your health care provider tells you to do that.  Do not take baths, swim, or use a hot tub until your health care provider approves.  You may shower 24-48 hours after the procedure or as told by your health care provider. ? Gently wash the site with plain soap and water. ? Pat the area dry with a clean towel. ? Do not rub the site. This may cause bleeding.  Do not apply powder or lotion to the  site. Keep the site clean and dry.  Check your femoral site every day for signs of infection. Check for: ? Redness, swelling, or pain. ? Fluid or blood. ? Warmth. ? Pus or a bad smell. Activity  For the first 2-3 days after your procedure, or as long as directed: ? Avoid climbing stairs as much as possible. ? Do not squat.  Do not lift anything that is heavier than 10 lb (4.5 kg), or the limit that you are told, until your health care provider says that it is safe.  Rest as directed. ? Avoid sitting for a long time without moving. Get up to take short walks every 1-2 hours.  Do not drive for 24 hours if you were given a medicine to help you relax (sedative). General instructions  Take over-the-counter and prescription medicines only as told by your health care provider.  Keep all follow-up visits as told by your health care provider. This is important. Contact a health care provider if you have:  A fever or chills.  You have redness, swelling, or pain around your insertion site. Get help right away if:  The catheter insertion area swells very fast.  You pass out.  You suddenly start to sweat or your skin gets clammy.  The catheter insertion area is bleeding, and the bleeding does not stop when you hold steady pressure on the area.  The area near or just beyond the catheter insertion site becomes pale, cool, tingly, or numb. These symptoms  may represent a serious problem that is an emergency. Do not wait to see if the symptoms will go away. Get medical help right away. Call your local emergency services (911 in the [place] Do not drive yourself to the hospital. Summary  After the procedure, it is common to have bruising that usually fades within 1-2 weeks.  Check your femoral site every day for signs of infection.  Do not lift anything that is heavier than 10 lb (4.5 kg), or the limit that you are told, until your health care provider says that it is safe. This  information is not intended to replace advice given to you by your health care provider. Make sure you discuss any questions you have with your health care provider. Document Released: 05/19/2014 Document Revised: 09/28/2017 Document Reviewed: 09/28/2017 Elsevier Patient Education  2020 [employer].

## 2019-08-29 NOTE — Care Management Important Message (Signed)
Important Message  Patient Details  Name: [name] MRN: [id number] Date of Birth: [birth year]   Medicare Important Message Given:  Yes     Shelda Altes 08/29/2019, 12:06 PM

## 2019-08-29 NOTE — H&P (View-Only) (Signed)
Subjective:  Patient presently denies any chest pain or shortness of breath.  Nuclear stress test showed moderate size reversible defect in the inferior and inferolateral wall from mid wall to the base and also small reversible defect in the apex EF of 61% noninvasive risk stratification highly discussed above findings with the patient and left cardiac catheterization possible PCI its risk and benefits and agrees for the procedure.  Objective:  Vital Signs in the last 24 hours: Temp:  [98.4 F (36.9 C)-99 F (37.2 C)] 99 F (37.2 C) (11/30 0933) Pulse Rate:  [66-82] 68 (11/30 0933) Resp:  [19-20] 19 (11/30 0515) BP: (95-133)/(54-74) 117/68 (11/30 0933) SpO2:  [96 %-100 %] 100 % (11/30 0933) Weight:  [66.1 kg] 66.1 kg (11/30 0558)  Intake/Output from previous day: 11/29 0701 - 11/30 0700 In: 1090 [P.O.:1090] Out: 1350 [Urine:1350] Intake/Output from this shift: Total I/O In: 120 [P.O.:120] Out: 500 [Urine:500]  Physical Exam: Exam unchanged  Lab Results: Recent Labs    08/28/19 1142 08/29/19 0521  WBC 7.8 7.9  HGB 9.6* 9.4*  PLT 234 234   Recent Labs    08/28/19 0502 08/29/19 0521  NA 140 140  K 3.6 4.3  CL 108 107  CO2 26 28  GLUCOSE 75 79  BUN 13 16  CREATININE 0.85 0.92   No results for input(s): TROPONINI in the last 72 hours.  Invalid input(s): CK, MB Hepatic Function Panel No results for input(s): PROT, ALBUMIN, AST, ALT, ALKPHOS, BILITOT, BILIDIR, IBILI in the last 72 hours. No results for input(s): CHOL in the last 72 hours. No results for input(s): PROTIME in the last 72 hours.  Imaging: Imaging results have been reviewed and Nm Myocar Multi W/spect W/wall Motion / Ef  Result Date: 08/28/2019 CLINICAL DATA:  Chest pain. EXAM: MYOCARDIAL IMAGING WITH SPECT (REST AND PHARMACOLOGIC-STRESS) GATED LEFT VENTRICULAR WALL MOTION STUDY LEFT VENTRICULAR EJECTION FRACTION TECHNIQUE: Standard myocardial SPECT imaging was performed after resting intravenous  injection of 10.4 mCi Tc-20m tetrofosmin. Subsequently, intravenous infusion of Lexiscan was performed under the supervision of the Cardiology staff. At peak effect of the drug, 31 mCi Tc-4m tetrofosmin was injected intravenously and standard myocardial SPECT imaging was performed. Quantitative gated imaging was also performed to evaluate left ventricular wall motion, and estimate left ventricular ejection fraction. COMPARISON:  None. FINDINGS: Perfusion: There is a defect involving most of the septal wall which is largely fixed. However, there is a small region of reversibility towards the apex. There is a moderate sized reversible defect in the inferior and inferolateral wall from mid wall to base. No other perfusion abnormalities identified. Wall Motion: Poor thickening in the septal wall. No other wall motion abnormalities. Left Ventricular Ejection Fraction: 61 % End diastolic volume 49 ml End systolic volume 19 ml IMPRESSION: 1. There is a large fixed defect involving the septal wall consistent with infarct with a small region of reversibility at the apex. There is also a moderate size reversible defect in the inferior and inferolateral walls from mid wall to base. 2. Poor thickening of the septal wall. No other wall motion abnormalities. 3. Left ventricular ejection fraction 61% 4. Non invasive risk stratification*: High *2012 Appropriate Use Criteria for Coronary Revascularization Focused Update: J Am Coll Cardiol. [id number]. http://content.airportbarriers.com.aspx?articleid=1201161 Electronically Signed   By: Dorise Bullion III M.D   On: 08/28/2019 12:12    Cardiac Studies:  Assessment/Plan:  Acute coronary syndrome/abnormal nuclear stress test Hypertension Hyperlipidemia History of morbid obesity status post gastric bypass surgery History of  obstructive sleep apnea Fibromyalgia History of CVA in remote past Depression Acute on chronic anemia, probably secondary to hydration and  rule out GI loss Acute kidney injury probably secondary to nonsteroidal anti-inflammatory meds.improved Depression GERD Degenerative joint disease status post lumbar fusion in the past Plan Discussed with patient regarding left cath possible PTCA stenting its risk and benefits i.e. death MI stroke need for emergency CABG local vascular complications etc. and consents for PCI  LOS: 3 days    Charolette Forward 08/29/2019, 11:51 AM

## 2019-08-29 NOTE — Interval H&P Note (Signed)
Cath Lab Visit (complete for each Cath Lab visit)  Clinical Evaluation Leading to the Procedure:   ACS: Yes.    Non-ACS:    Anginal Classification: CCS III  Anti-ischemic medical therapy: Maximal Therapy (2 or more classes of medications)  Non-Invasive Test Results: High-risk stress test findings: cardiac mortality >3%/year  Prior CABG: No previous CABG      History and Physical Interval Note:  08/29/2019 12:00 PM  Jodi Bowman  has presented today for surgery, with the diagnosis of NSTEMI.  The various methods of treatment have been discussed with the patient and family. After consideration of risks, benefits and other options for treatment, the patient has consented to  Procedure(s): LEFT HEART CATH AND CORONARY ANGIOGRAPHY (N/A) as a surgical intervention.  The patient's history has been reviewed, patient examined, no change in status, stable for surgery.  I have reviewed the patient's chart and labs.  Questions were answered to the patient's satisfaction.     Jodi Bowman

## 2019-08-30 ENCOUNTER — Encounter (HOSPITAL_COMMUNITY): Payer: Self-pay | Admitting: Cardiology

## 2019-08-30 LAB — POC SARS CORONAVIRUS 2 AG

## 2019-09-06 IMAGING — DX DG CHEST 2V
2 series · 2 of 2 positions shown · non-contrast
Comparison: None.

CLINICAL DATA: Central chest pain with dyspnea and cough.

EXAM:
CHEST - 2 VIEW

[w chest lat]
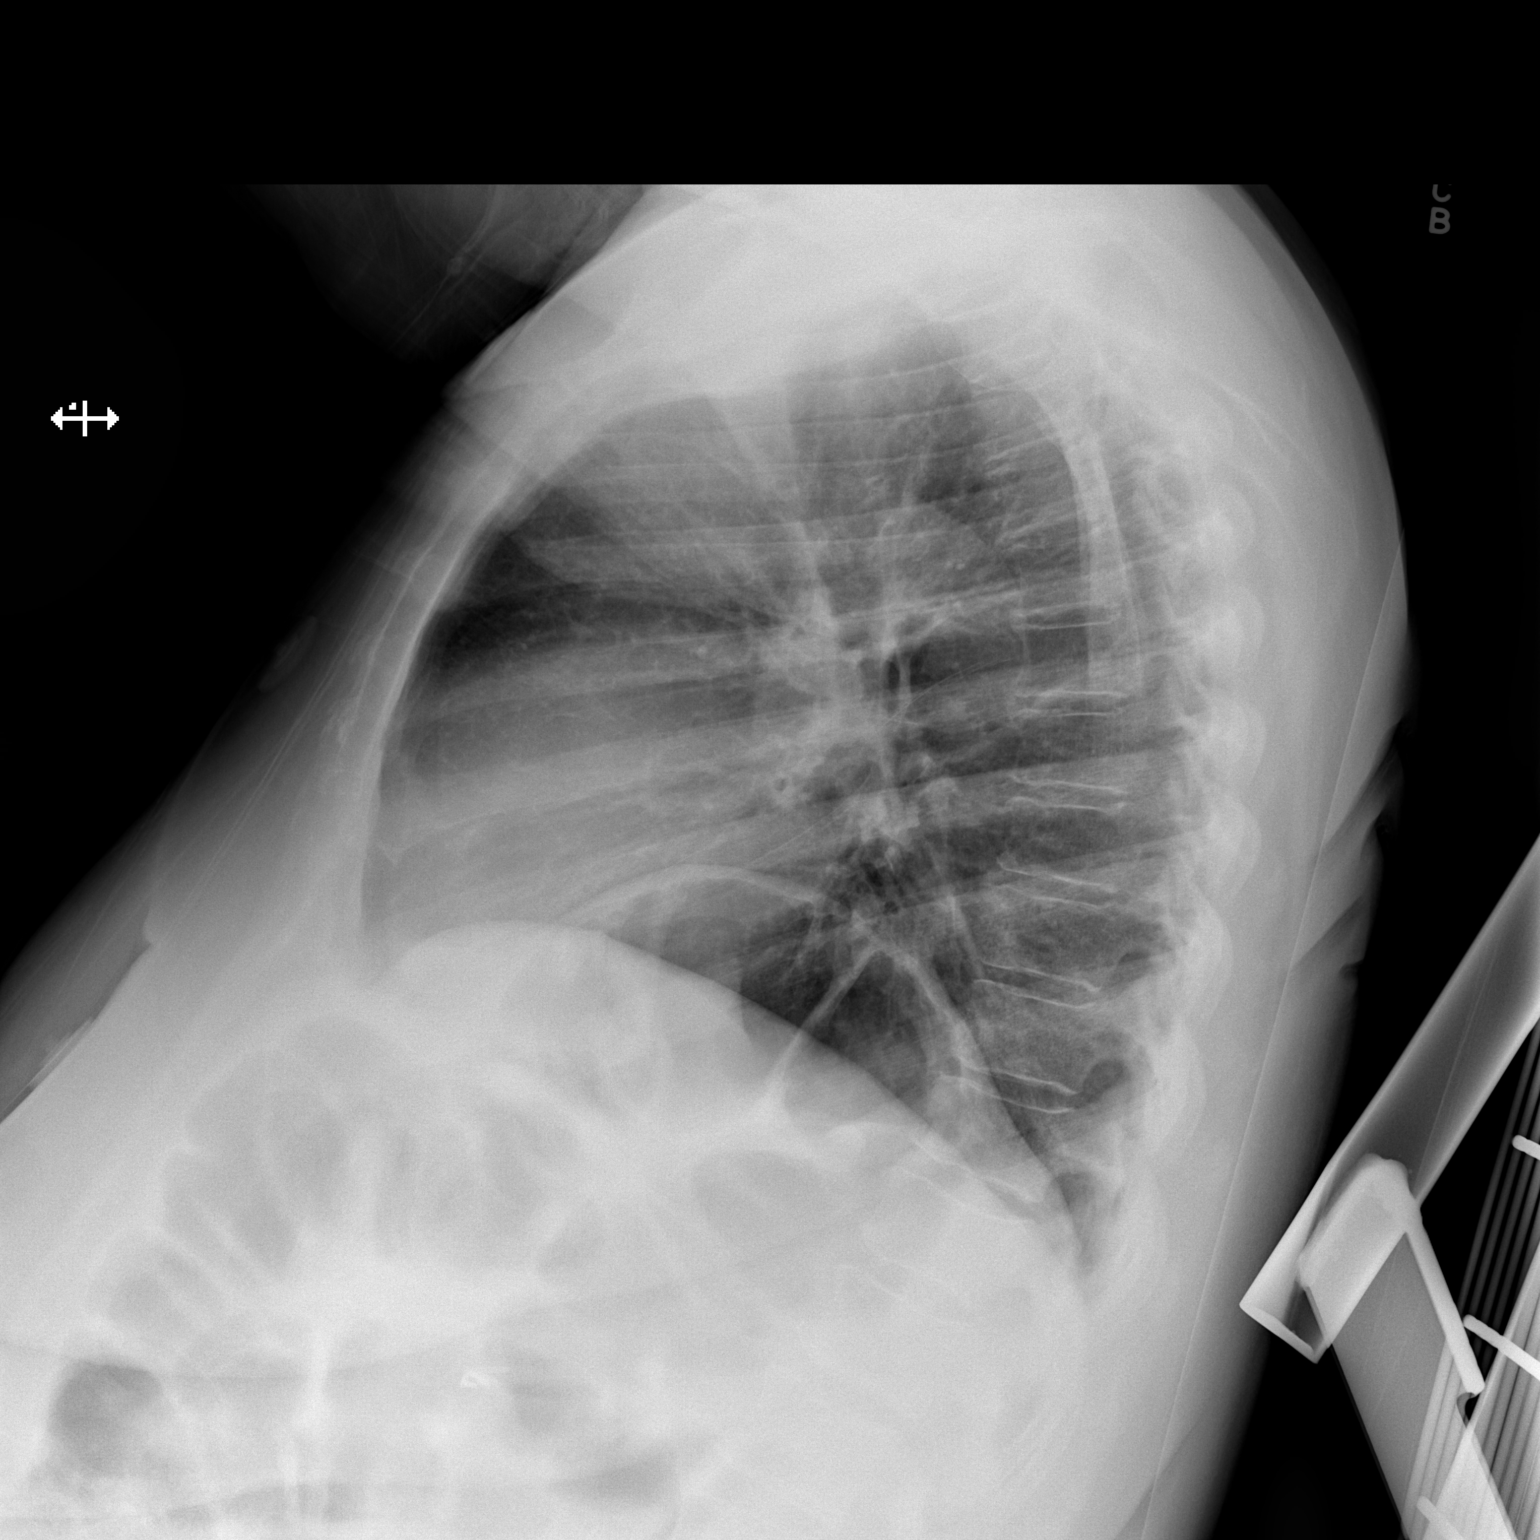

[x chest ap]
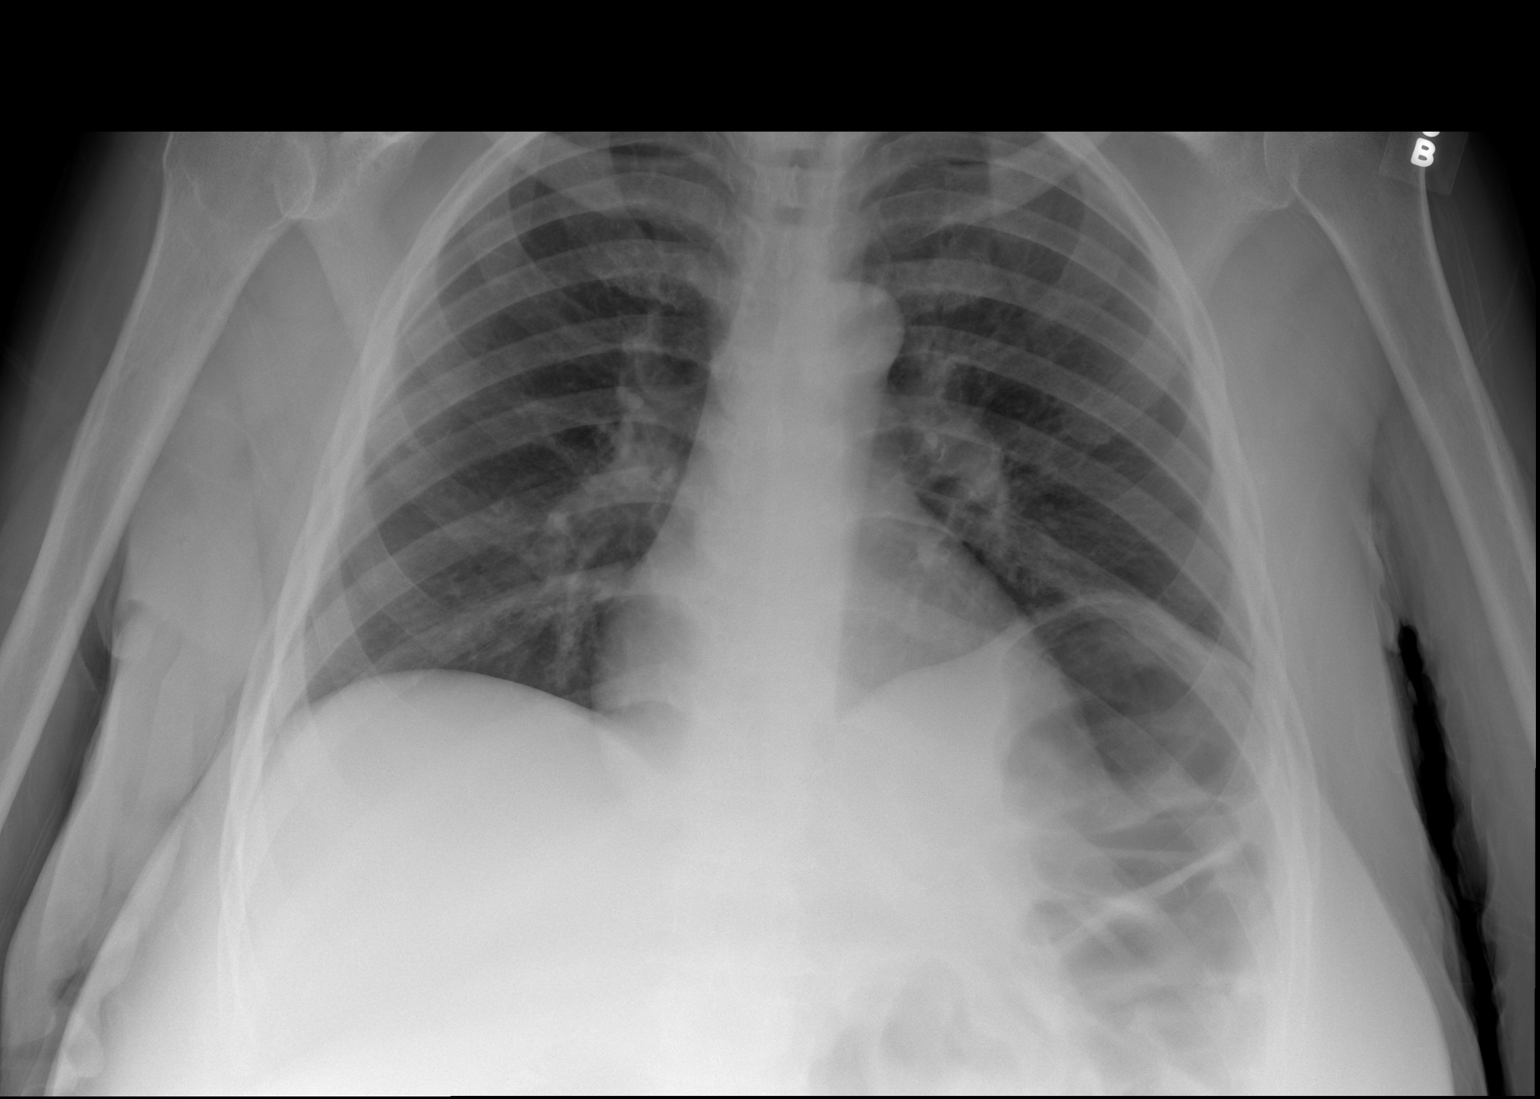

[2 of 2 positions shown; findings below may reference images not displayed]

FINDINGS: The heart size and mediastinal contours are within normal limits.
Both lungs are clear. The visualized skeletal structures are
unremarkable.
IMPRESSION: No active cardiopulmonary disease.

## 2019-09-07 IMAGING — CT CT ABD-PELV W/ CM
4 of 12 series · 14 of 46 positions shown, 18 images · IV contrast (APPLIED)
Comparison: 03/06/2018 abdominal CT.  09/22/2011 chest CT

CLINICAL DATA: Intermediate probability pulmonary for embolism.
Nausea and dizziness.

EXAM:
CT ANGIOGRAPHY CHEST
CT ABDOMEN AND PELVIS WITH CONTRAST
TECHNIQUE: Multidetector CT imaging of the chest was performed using the
standard protocol during bolus administration of intravenous
contrast. Multiplanar CT image reconstructions and MIPs were
obtained to evaluate the vascular anatomy. Multidetector CT imaging
of the abdomen and pelvis was performed using the standard protocol
during bolus administration of intravenous contrast.
CONTRAST:  577cc IPUC5J-HCG IOPAMIDOL (IPUC5J-HCG) INJECTION 76%

[Series 6: arterial · axial · arterial · 0.71mm/px · z∈[+964,+1056]mm · 3 of 115 slices shown]
[im 23/115  soft-tissue]
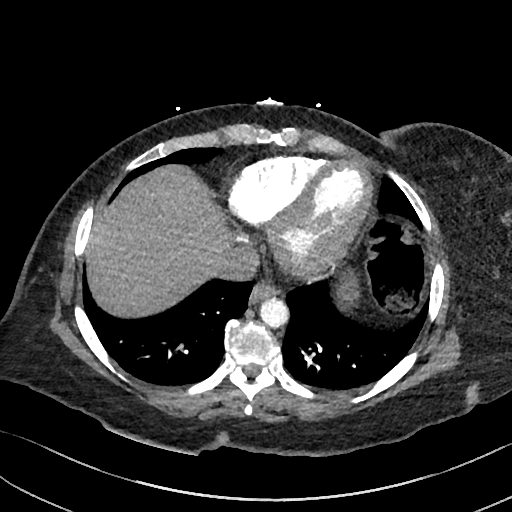
[im 46/115  soft-tissue]
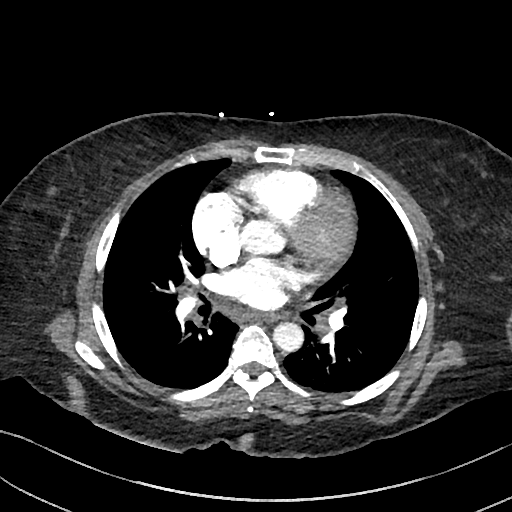
[im 69/115  soft-tissue]
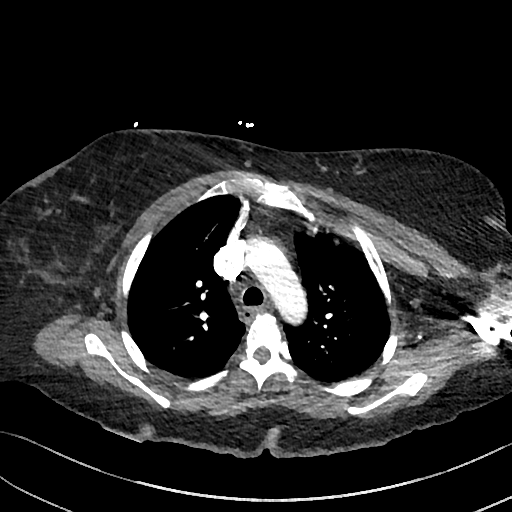

[Series 8: thins · axial · 0.71mm/px · z∈[+937,+1129]mm · 8 of 327 slices shown]
[im 26/327  soft-tissue]
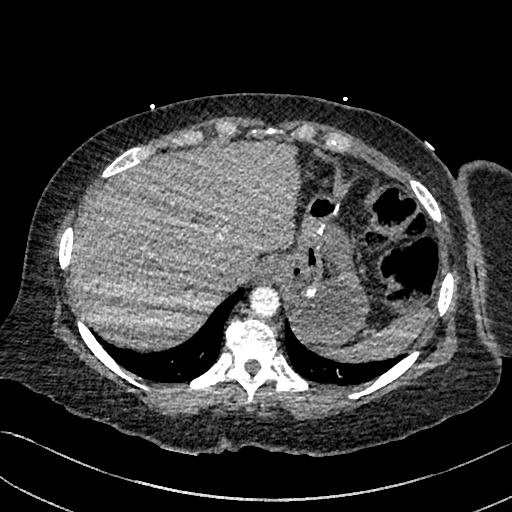
[im 76/327  soft-tissue]
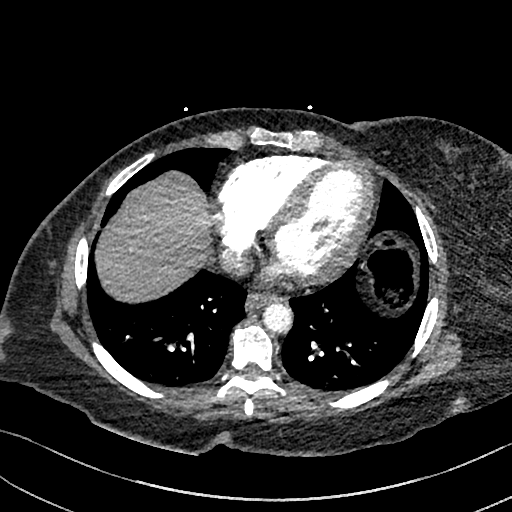
[im 101/327  soft-tissue]
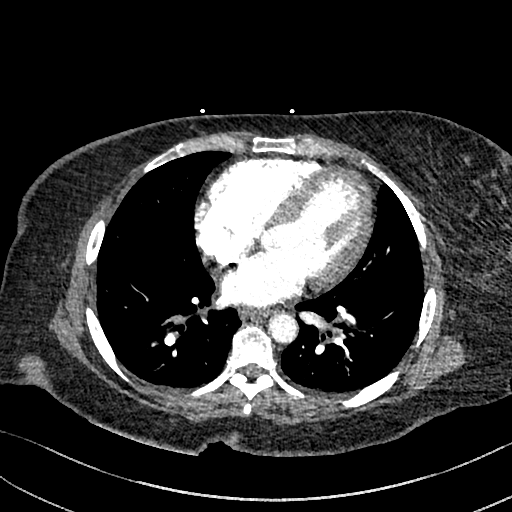
[im 151/327  soft-tissue]
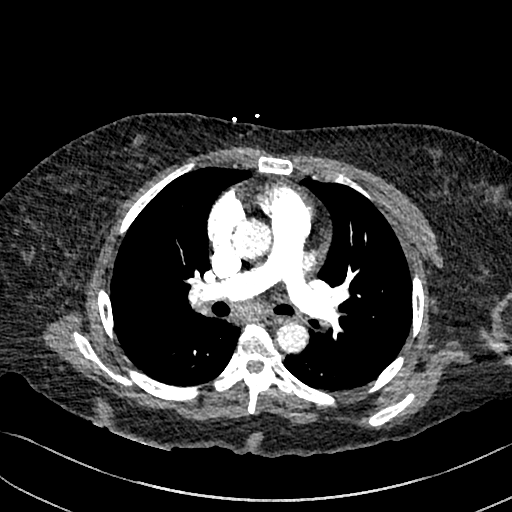
[im 176/327  soft-tissue]
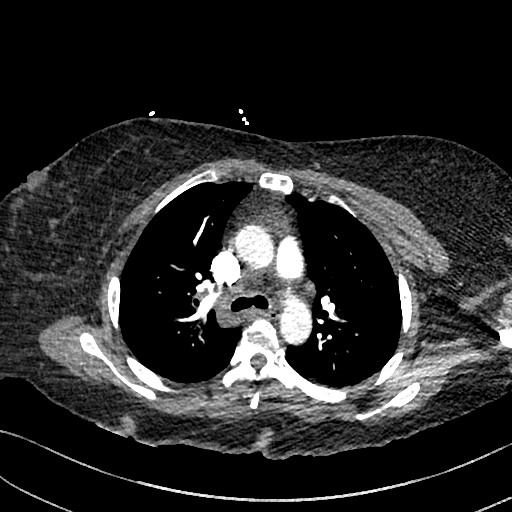
[im 226/327  soft-tissue]
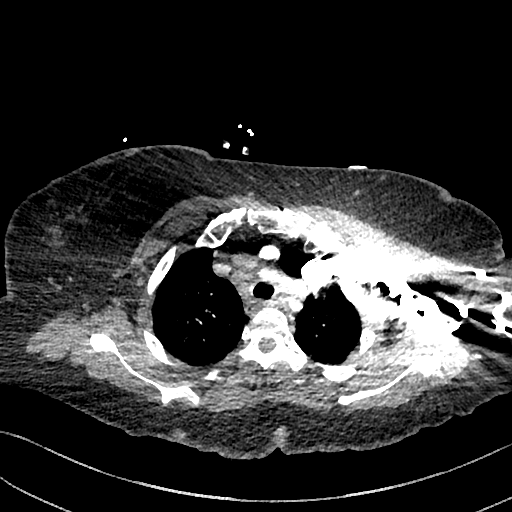
[im 251/327  soft-tissue]
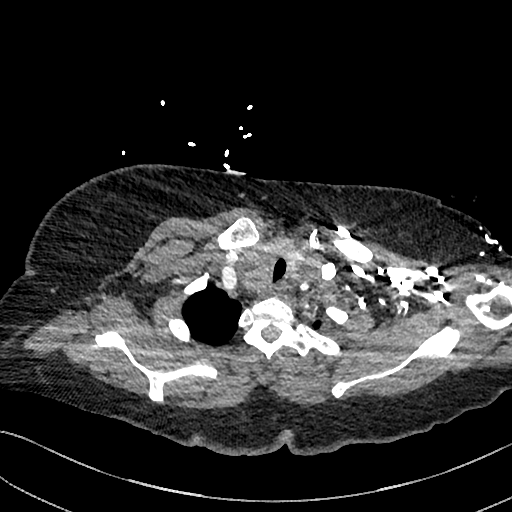
[im 301/327  soft-tissue]
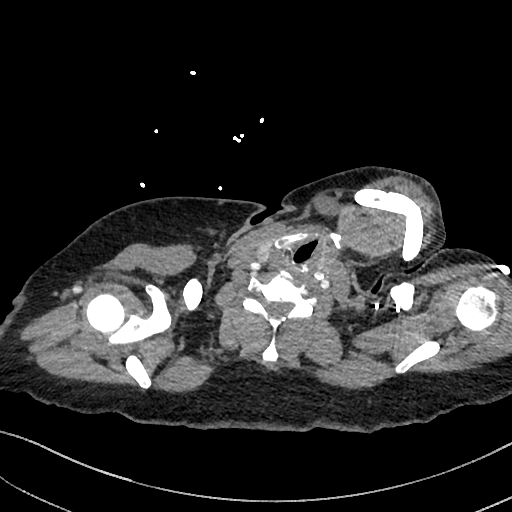

[Series 9: cor · coronal · 0.46mm/px · 1 of 115 slices shown, 2 images]
[im 58/115  soft-tissue]
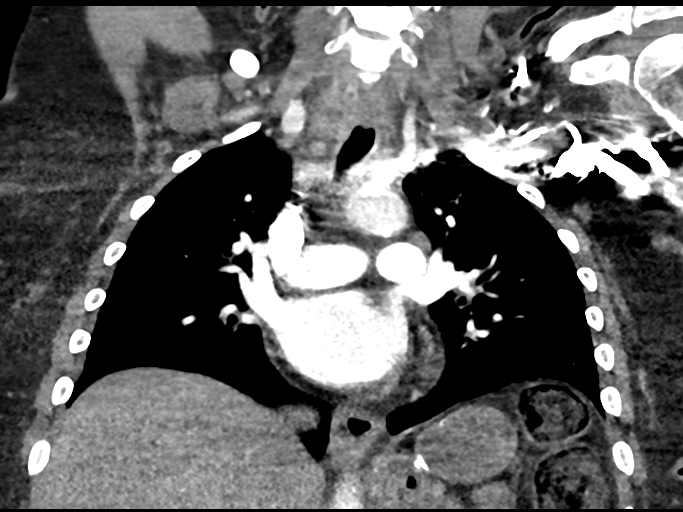
[im 58/115  bone]
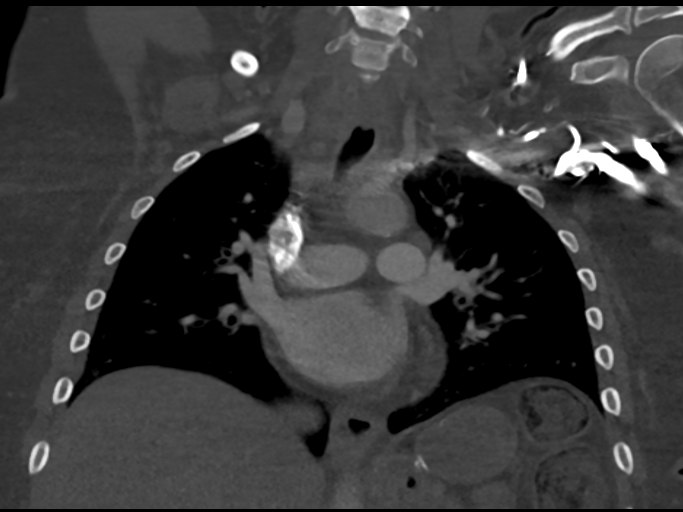

[Series 13: abdomen 5.0 · axial · 0.77mm/px · z∈[+713,+863]mm · 2 of 90 slices shown, 5 images]
[im 30/90  soft-tissue]
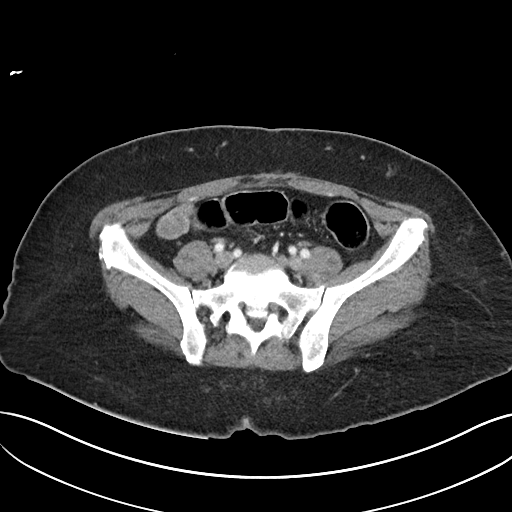
[im 30/90  lung]
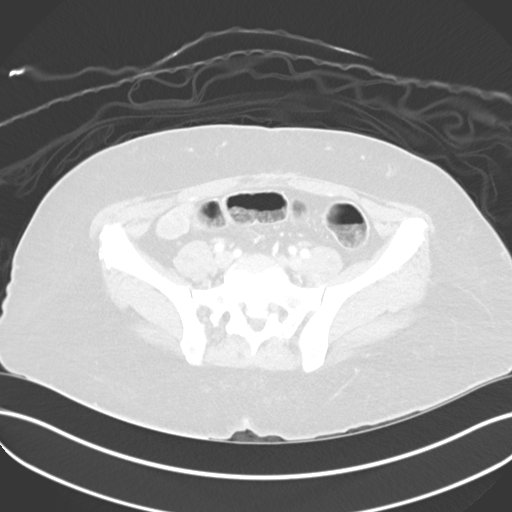
[im 30/90  bone]
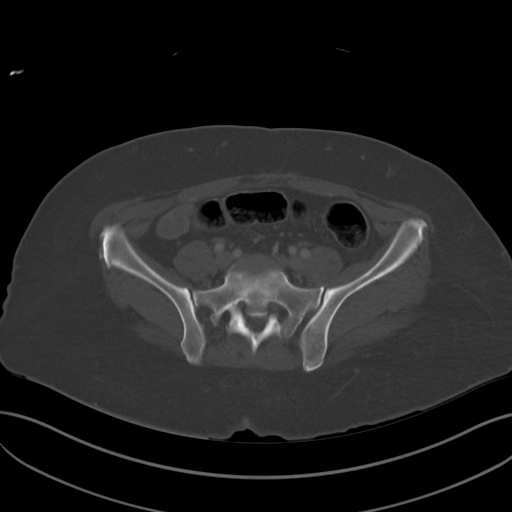
[im 60/90  soft-tissue]
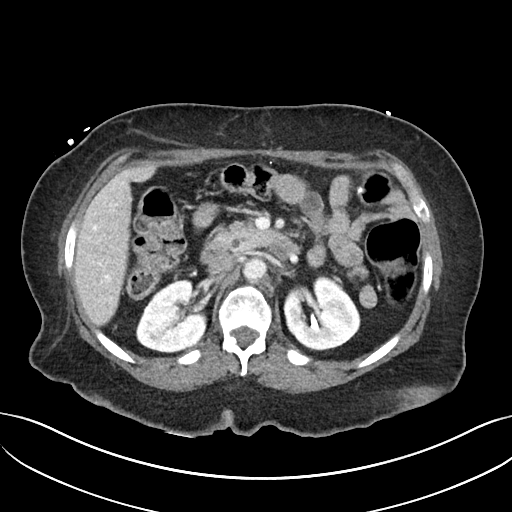
[im 60/90  lung]
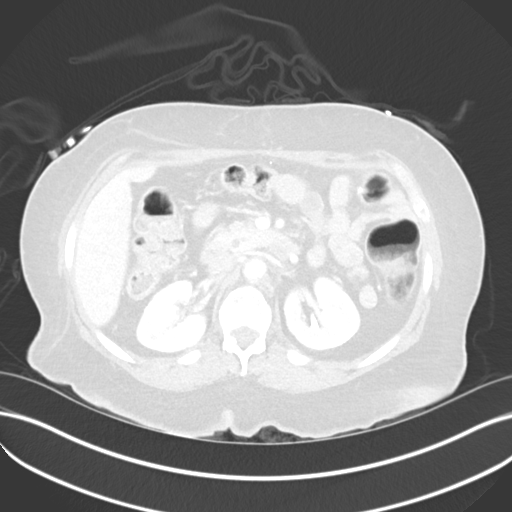

[14 of 46 positions shown; findings below may reference images not displayed]

FINDINGS: CTA CHEST FINDINGS

Cardiovascular: Satisfactory opacification of the pulmonary arteries
to the segmental level. No evidence of pulmonary embolism. Normal
heart size. No pericardial effusion.

Mediastinum/Nodes: Bilateral thyroid nodules measuring up to 2.9 cm
on the right, stable [DATE]. Left nodule measures 2 cm and is new
from prior

Mildly enlarged hilar lymph nodes, likely reactive in this setting.

Lungs/Pleura: Generalized bronchitic airway thickening with mild
mucoid impaction in the lower lobes. Mild atelectasis.

Musculoskeletal: No chest wall abnormality. No acute osseous
findings.

Review of the MIP images confirms the above findings.

CT ABDOMEN and PELVIS FINDINGS

Hepatobiliary: No focal liver abnormality.Cholecystectomy which
accounts for chronic intrahepatic bile duct prominence.

Pancreas: Unremarkable.

Spleen: Unremarkable.

Adrenals/Urinary Tract: Negative adrenals. No hydronephrosis or
stone. Small bilateral renal cysts. Unremarkable bladder.

Stomach/Bowel: No obstruction. No appendicitis. Unremarkable gastric
bypass

Vascular/Lymphatic: No acute vascular abnormality. No mass or
adenopathy.

Reproductive:Hysterectomy and possible left oophorectomy.

Other: No ascites or pneumoperitoneum.

Musculoskeletal: No acute abnormalities.

Review of the MIP images confirms the above findings.
IMPRESSION: Chest CTA:

1. Bronchitis.
2. Negative for pulmonary embolism.
3. Bilateral thyroid nodules, stable on the right and new on the
left (2 cm) since 7227. Suggest thyroid ultrasound.

Abdomen:

1. No acute finding.
2. Cholecystectomy, hysterectomy, and gastric bypass.

## 2019-09-28 ENCOUNTER — Emergency Department (HOSPITAL_BASED_OUTPATIENT_CLINIC_OR_DEPARTMENT_OTHER): Payer: Medicare Other

## 2019-09-28 ENCOUNTER — Encounter (HOSPITAL_COMMUNITY): Payer: Self-pay | Admitting: Emergency Medicine

## 2019-09-28 ENCOUNTER — Emergency Department (HOSPITAL_COMMUNITY): Payer: Medicare Other

## 2019-09-28 ENCOUNTER — Other Ambulatory Visit: Payer: Self-pay

## 2019-09-28 ENCOUNTER — Emergency Department (HOSPITAL_COMMUNITY)
Admission: EM | Admit: 2019-09-28 | Discharge: 2019-09-28 | Disposition: A | Discharge status: Home / Self Care | Payer: Medicare Other | Attending: Emergency Medicine | Admitting: Emergency Medicine

## 2019-09-28 DIAGNOSIS — R609 Edema, unspecified: Secondary | ICD-10-CM | POA: Diagnosis not present

## 2019-09-28 DIAGNOSIS — I10 Essential (primary) hypertension: Secondary | ICD-10-CM | POA: Insufficient documentation

## 2019-09-28 DIAGNOSIS — Z79899 Other long term (current) drug therapy: Secondary | ICD-10-CM | POA: Insufficient documentation

## 2019-09-28 DIAGNOSIS — E876 Hypokalemia: Secondary | ICD-10-CM

## 2019-09-28 DIAGNOSIS — Z7982 Long term (current) use of aspirin: Secondary | ICD-10-CM | POA: Diagnosis not present

## 2019-09-28 DIAGNOSIS — R600 Localized edema: Secondary | ICD-10-CM

## 2019-09-28 LAB — CBC
HCT: 31.2 % — ABNORMAL LOW (ref 36.0–46.0)
Hemoglobin: 10.1 g/dL — ABNORMAL LOW (ref 12.0–15.0)
MCH: 30.4 pg (ref 26.0–34.0)
MCHC: 32.4 g/dL (ref 30.0–36.0)
MCV: 94 fL (ref 80.0–100.0)
Platelets: 164 10*3/uL (ref 150–400)
RBC: 3.32 MIL/uL — ABNORMAL LOW (ref 3.87–5.11)
RDW: 14.7 % (ref 11.5–15.5)
WBC: 8.9 10*3/uL (ref 4.0–10.5)
nRBC: 0 % (ref 0.0–0.2)

## 2019-09-28 LAB — BRAIN NATRIURETIC PEPTIDE: B Natriuretic Peptide: 124.8 pg/mL — ABNORMAL HIGH (ref 0.0–100.0)

## 2019-09-28 LAB — MAGNESIUM: Magnesium: 1.2 mg/dL — ABNORMAL LOW (ref 1.7–2.4)

## 2019-09-28 LAB — BASIC METABOLIC PANEL
Anion gap: 7 (ref 5–15)
BUN: 12 mg/dL (ref 6–20)
CO2: 27 mmol/L (ref 22–32)
Calcium: 7.6 mg/dL — ABNORMAL LOW (ref 8.9–10.3)
Chloride: 110 mmol/L (ref 98–111)
Creatinine, Ser: 0.93 mg/dL (ref 0.44–1.00)
GFR calc Af Amer: >60 mL/min (ref >60)
GFR calc non Af Amer: >60 mL/min (ref >60)
Glucose, Bld: 101 mg/dL — ABNORMAL HIGH (ref 70–99)
Potassium: 2.4 mmol/L — CL (ref 3.5–5.1)
Sodium: 144 mmol/L (ref 135–145)

## 2019-09-28 LAB — HEPATIC FUNCTION PANEL
ALT: 32 U/L (ref 0–44)
AST: 34 U/L (ref 15–41)
Albumin: 2.3 g/dL — ABNORMAL LOW (ref 3.5–5.0)
Alkaline Phosphatase: 75 U/L (ref 38–126)
Bilirubin, Direct: <0.1 mg/dL (ref 0.0–0.2)
Total Bilirubin: 0.2 mg/dL — ABNORMAL LOW (ref 0.3–1.2)
Total Protein: 4.3 g/dL — ABNORMAL LOW (ref 6.5–8.1)

## 2019-09-28 MED ORDER — POTASSIUM CHLORIDE CRYS ER 20 MEQ PO TBCR: 20.0000 meq | EXTENDED_RELEASE_TABLET | Freq: Two times a day (BID) | ORAL | 0 refills | Status: DC

## 2019-09-28 MED ORDER — MAGNESIUM OXIDE 400 MG PO CAPS: 400.0000 mg | ORAL_CAPSULE | Freq: Every day | ORAL | 0 refills | Status: AC

## 2019-09-28 MED ORDER — POTASSIUM CHLORIDE 10 MEQ/100ML IV SOLN
10.0000 meq | INTRAVENOUS | Status: AC
  Administered 2019-09-28 (×2): 10 meq via INTRAVENOUS
  Filled 2019-09-28 (×2): qty 100

## 2019-09-28 MED ORDER — MAGNESIUM OXIDE 400 (241.3 MG) MG PO TABS
400.0000 mg | ORAL_TABLET | Freq: Once | ORAL | Status: AC
  Administered 2019-09-28: 400 mg via ORAL
  Filled 2019-09-28: qty 1

## 2019-09-28 MED ORDER — POTASSIUM CHLORIDE CRYS ER 20 MEQ PO TBCR
60.0000 meq | EXTENDED_RELEASE_TABLET | Freq: Once | ORAL | Status: AC
  Administered 2019-09-28: 60 meq via ORAL
  Filled 2019-09-28: qty 3

## 2019-09-28 NOTE — Progress Notes (Signed)
Lower extremity venous has been completed.   Preliminary results in CV Proc.   Abram Sander 09/28/2019 5:32 PM

## 2019-09-28 NOTE — Discharge Instructions (Signed)
Please read and follow all provided instructions.  Your diagnoses today include:  1. Leg edema   2. Hypokalemia   3. Hypomagnesemia    Tests performed today include:  Blood counts and electrolytes -low potassium and low magnesium  Ultrasound of your legs -no signs of blood clots  Vital signs. See below for your results today.   Medications prescribed:  Potassium and magnesium supplements  Take any prescribed medications only as directed.  Home care instructions:  Follow any educational materials contained in this packet.  Follow-up instructions: Please follow-up with your primary care provider in the next 3 days for further evaluation of your symptoms.  You should talk the doctor who recently prescribed amlodipine.  This may be causing your lower extremity swelling.  Your liver, kidneys, and heart look good today.  We do not see any signs of blood clots.  Return instructions:   Please return to the Emergency Department if you experience worsening symptoms.   Return with chest pain, shortness of breath, lightheadedness or passing out spells.  Please return if you have any other emergent concerns.  Additional Information:  Your vital signs today were: BP (!) 133/94   Pulse 75   Temp 98.5 F (36.9 C) (Oral)   Resp 18   SpO2 100%  If your blood pressure (BP) was elevated above 135/85 this visit, please have this repeated by your doctor within one month. --------------

## 2019-09-28 NOTE — ED Notes (Signed)
Discharge instructions discussed with pt. Pt verbalized understanding. Pt stable and ambulatory. No signature pad available. 

## 2019-09-28 NOTE — ED Notes (Signed)
Pt's K+ was not given d/t pt being in a hall bed & not on a cardiac monitor. Pt had a portable EKG done & then was hooked to the primary & secondary leads do she can be hooked up to a "baby monitor" & the cord needed to convert the leads to the monitor could not be found. Next shift RN was made aware.

## 2019-09-28 NOTE — ED Triage Notes (Signed)
Pt reports bilateral feet swelling that now has progressed to legs in the past few days.

## 2019-09-28 NOTE — ED Provider Notes (Signed)
[facility] EMERGENCY DEPARTMENT Provider Note   CSN: [id number] Arrival date & time: 09/28/19  1327     History Chief Complaint  Patient presents with   Leg Swelling    Jodi Bowman is a 58 y.o. female.  Patient with history of hypertension, high cholesterol, recent catheterization demonstrating no heart failure or significant coronary artery disease -- presents to the emergency department today with complaint of lower extremity swelling.  This is been ongoing for the past 2 days.  She noted swelling that began in her left leg.  Her leg was tender.  And today it is involve the right leg as well.  She denies any current chest pains or shortness of breath.  No history of kidney or liver problems.  She was on amlodipine but notes appear to d/c this 08/29/2019.  She states that she was put back on this medication by Dr. Terrence Dupont.  She is currently on metoprolol.  No fevers or cough.  No nausea, vomiting, diarrhea.  Onset of symptoms acute.  Course is constant.        Past Medical History:  Diagnosis Date   Arthritis    Calculus of gallbladder without mention of cholecystitis or obstruction    Dizziness    Fibromyalgia    GERD (gastroesophageal reflux disease)    Goiter    Hypercholesteremia    Hypertension    Lower back pain    Migraine    Nontoxic uninodular goiter    sees dr vollmer at [facility]   Obesity    PONV (postoperative nausea and vomiting)    Sleep apnea    STOPBANG=5   Stroke [facility]) 2000    Patient Active Problem List   Diagnosis Date Noted   ACS (acute coronary syndrome) ([place]) 08/25/2019   Chest pain 08/25/2019   Postoperative seroma involving nervous system after nervous system procedure 04/05/2019   Drainage from wound 04/02/2019   Infective otitis externa of left ear    Left otitis media    AKI (acute kidney injury) ([place])    Urinary retention    Orthostasis    Acute blood loss anemia    Fibromyalgia     Chronic pain syndrome    Lumbar radiculopathy 03/08/2019   Orthostatic hypotension 03/06/2019   Postoperative urinary retention 03/06/2019   Lumbar foraminal stenosis 03/04/2019   S/P exploratory laparotomy 03/06/2018   Bowel obstruction ([place]) 03/06/2018   Chest pain, rule out acute myocardial infarction 12/17/2017   Hypokalemia 12/17/2017   Acute lower UTI 12/17/2017   Vertigo 04/30/2017   Small vessel disease, cerebrovascular 04/30/2017   Chronic low back pain 08/01/2015   Right hip pain 08/01/2015   Abnormality of gait 08/01/2015   Left leg weakness    Low back pain with radiation    Syncope 07/30/2014   Atypical chest pain 03/12/2014   Lap chole IOC April 2013 01/29/2012   Gallstones 12/04/2011   Thyroid nodule-non neoplastic goiter by needle aspiration 12/04/2011   GLUCOSE INTOLERANCE 03/12/2010   DYSLIPIDEMIA 03/12/2010   Chronic migraine 03/12/2010   Carotid stenosis 03/12/2010   CEREBROVASCULAR ACCIDENT 03/12/2010   LIPOMA 01/29/2010   HEADACHE 01/29/2010   ANKLE INJURY, RIGHT 04/12/2009   PHARYNGITIS 03/21/2009   Backache 03/01/2009   ALLERGIC RHINITIS 03/17/2007   LOW BACK PAIN 03/17/2007   Essential hypertension 01/01/2007   ANXIETY STATE NOS 09/11/2005    Past Surgical History:  Procedure Laterality Date   ABDOMINAL HYSTERECTOMY     BOWEL RESECTION N/A 03/06/2018  Procedure: SMALL BOWEL ANASTAMOSIS;  Surgeon: Clovis Riley, MD;  Location: [facility];  Service: General;  Laterality: N/A;   CATARACT EXTRACTION W/ INTRAOCULAR LENS IMPLANT Bilateral    CESAREAN SECTION  yrs ago   done x 2   CHOLECYSTECTOMY  01/05/2012   Procedure: LAPAROSCOPIC CHOLECYSTECTOMY WITH INTRAOPERATIVE CHOLANGIOGRAM;  Surgeon: Pedro Earls, MD;  Location: [facility];  Service: General;  Laterality: N/A;   COLONOSCOPY  10/08/2012   Procedure: COLONOSCOPY;  Surgeon: Beryle Beams, MD;  Location: [facility];  Service: Endoscopy;  Laterality:  N/A;   KNEE ARTHROSCOPY  one 1995 and 1 in 1997   both knees done   LAPAROSCOPY N/A 03/06/2018   Procedure: LAPAROSCOPY DIAGNOSTIC WITH LYSIS OF ADHESIONS;  Surgeon: Clovis Riley, MD;  Location: [facility];  Service: General;  Laterality: N/A;   LAPAROTOMY N/A 03/06/2018   Procedure: EXPLORATORY LAPAROTOMY, RESECTION OF DISTAL ROUX, CLOSURE OF INTERNAL HERNIA;  Surgeon: Clovis Riley, MD;  Location: [place] [facility];  Service: General;  Laterality: N/A;   LEFT HEART CATH AND CORONARY ANGIOGRAPHY N/A 08/29/2019   Procedure: LEFT HEART CATH AND CORONARY ANGIOGRAPHY;  Surgeon: Charolette Forward, MD;  Location: [facility];  Service: Cardiovascular;  Laterality: N/A;   LUMBAR WOUND DEBRIDEMENT N/A 04/03/2019   Procedure: LUMBAR WOUND WASHOUT;  Surgeon: Judith Part, MD;  Location: [facility];  Service: Neurosurgery;  Laterality: N/A;   POSTERIOR LUMBAR FUSION  03/04/2019   surgery for endometriosis  yrs ago   thryoid biopsy  December 01, 2011    at [facility]     OB History   No obstetric history on file.     Family History  Problem Relation Age of Onset   Diabetes Mother    Hypertension Mother    Transient ischemic attack Mother    Seizures Mother    Dementia Mother    Other Father        MVA   Cancer Brother        colon and lung   Cancer Maternal Grandmother        colon    Social History   Tobacco Use   Smoking status: Never Smoker   Smokeless tobacco: Never Used  Substance Use Topics   Alcohol use: Not Currently    Alcohol/week: 0.0 standard drinks   Drug use: No    Home Medications Prior to Admission medications   Medication Sig Start Date End Date Taking? Authorizing Provider  albuterol (PROVENTIL HFA) 108 (90 Base) MCG/ACT inhaler Inhale 1-2 puffs into the lungs every 6 (six) hours as needed for wheezing or shortness of breath.    [provider]  aspirin EC 81 MG tablet Take 81 mg by mouth daily.    [provider]  DULoxetine (CYMBALTA) 60  MG capsule Take 60 mg by mouth daily. 08/11/19   [provider]  ferrous sulfate 325 (65 FE) MG EC tablet Take 325 mg by mouth daily. 07/26/19   [provider]  fluticasone (FLONASE) 50 MCG/ACT nasal spray Place 1 spray into both nostrils daily as needed for allergies.  11/08/17   [provider]  folic acid (FOLVITE) 1 MG tablet Take 1 mg by mouth daily. 10/14/17   [provider]  KLOR-CON M20 20 MEQ tablet Take 20 mEq by mouth 2 (two) times daily. 07/20/19   [provider]  loratadine (CLARITIN) 10 MG tablet Take 10 mg by mouth daily.     [provider]  methocarbamol (ROBAXIN) 500 MG tablet Take 1 tablet (500 mg total) by mouth every 6 (six) hours as needed for muscle spasms. 03/15/19   Angiulli, Lavon Paganini, PA-C  metoprolol succinate (TOPROL-XL) 100 MG 24 hr tablet Take 100 mg by mouth at bedtime.  07/10/19   [provider]  nitroGLYCERIN (NITROSTAT) 0.4 MG SL tablet Place 1 tablet (0.4 mg total) under the tongue every 5 (five) minutes x 3 doses as needed for chest pain. Patient taking differently: Place 0.4 mg under the tongue every 5 (five) minutes as needed for chest pain (MAXIMUM OF 3 DOSES).  03/13/14   Charolette Forward, MD  oxyCODONE (OXY IR/ROXICODONE) 5 MG immediate release tablet Take 1-2 tablets by mouth every 6 (six) hours as needed for pain. 08/12/19   [provider]  pantoprazole (PROTONIX) 40 MG tablet Take 1 tablet (40 mg total) by mouth 2 (two) times daily. 03/15/19   Angiulli, Lavon Paganini, PA-C  polyethylene glycol (MIRALAX / GLYCOLAX) 17 g packet Take 17 g by mouth daily as needed for mild constipation. 03/08/19   Viona Gilmore D, NP  pregabalin (LYRICA) 75 MG capsule Take 1 capsule (75 mg total) by mouth 3 (three) times daily. 03/15/19   Angiulli, Lavon Paganini, PA-C  Vitamin D, Ergocalciferol, (DRISDOL) 1.25 MG (50000 UT) CAPS capsule Take 1 capsule (50,000 Units total) by mouth every Monday, Wednesday, and Friday.  03/16/19   Angiulli, Lavon Paganini, PA-C  metoprolol (LOPRESSOR) 50 MG tablet Take 50 mg by mouth 2 (two) times daily.    12/04/11  [provider]  simvastatin (ZOCOR) 20 MG tablet Take 20 mg by mouth at bedtime.    12/04/11  [provider]    Allergies    Shrimp [shellfish allergy] and Naproxen  Review of Systems   Review of Systems  Constitutional: Negative for fever.  HENT: Negative for rhinorrhea and sore throat.   Eyes: Negative for redness.  Respiratory: Negative for cough and shortness of breath.   Cardiovascular: Positive for leg swelling. Negative for chest pain.  Gastrointestinal: Negative for abdominal pain, diarrhea, nausea and vomiting.  Genitourinary: Negative for dysuria.  Musculoskeletal: Negative for myalgias.  Skin: Negative for rash.  Neurological: Negative for headaches.    Physical Exam Updated Vital Signs BP (!) 133/94    Pulse 75    Temp 98.5 F (36.9 C) (Oral)    Resp 18    SpO2 100%   Physical Exam Vitals and nursing note reviewed.  Constitutional:      Appearance: She is well-developed. She is not diaphoretic.  HENT:     Head: Normocephalic and atraumatic.     Mouth/Throat:     Mouth: Mucous membranes are not dry.  Eyes:     Conjunctiva/sclera: Conjunctivae normal.  Neck:     Vascular: Normal carotid pulses. No carotid bruit or JVD.     Trachea: Trachea normal. No tracheal deviation.  Cardiovascular:     Rate and Rhythm: Normal rate and regular rhythm.     Pulses: No decreased pulses.     Heart sounds: Normal heart sounds, S1 normal and S2 normal. No murmur.  Pulmonary:     Effort: Pulmonary effort is normal. No respiratory distress.     Breath sounds: No wheezing.  Chest:     Chest wall: No tenderness.  Abdominal:     General: Bowel sounds are normal.     Palpations: Abdomen is soft.     Tenderness: There is no abdominal tenderness. There is no  guarding or rebound.  Musculoskeletal:        General: Swelling present. Normal  range of motion.     Cervical back: Normal range of motion and neck supple. No muscular tenderness.     Comments: Patient 1-2+ edema to the bilateral lower extremities, symmetric, no signs of cellulitis.  2+ pedal pulses bilaterally.  Skin:    General: Skin is warm and dry.     Coloration: Skin is not pale.     Findings: No erythema.  Neurological:     General: No focal deficit present.     Mental Status: She is alert and oriented to person, place, and time.     ED Results / Procedures / Treatments   Labs (all labs ordered are listed, but only abnormal results are displayed) Labs Reviewed  BASIC METABOLIC PANEL - Abnormal; Notable for the following components:      Result Value   Potassium 2.4 (*)    Glucose, Bld 101 (*)    Calcium 7.6 (*)    All other components within normal limits  CBC - Abnormal; Notable for the following components:   RBC 3.32 (*)    Hemoglobin 10.1 (*)    HCT 31.2 (*)    All other components within normal limits  BRAIN NATRIURETIC PEPTIDE - Abnormal; Notable for the following components:   B Natriuretic Peptide 124.8 (*)    All other components within normal limits  HEPATIC FUNCTION PANEL - Abnormal; Notable for the following components:   Total Protein 4.3 (*)    Albumin 2.3 (*)    Total Bilirubin 0.2 (*)    All other components within normal limits  MAGNESIUM - Abnormal; Notable for the following components:   Magnesium 1.2 (*)    All other components within normal limits  URINALYSIS, ROUTINE W REFLEX MICROSCOPIC    ED ECG REPORT   Date: 09/28/2019  Rate: 59  Rhythm: sinus bradycardia  QRS Axis: normal  Intervals: normal  ST/T Wave abnormalities: normal  Conduction Disutrbances:none  Narrative Interpretation:   Old EKG Reviewed: unchanged  I have personally reviewed the EKG tracing and agree with the computerized printout as noted.  Radiology DG Chest 2 View  Result Date: 09/28/2019 CLINICAL DATA:  Leg swelling. EXAM: CHEST - 2 VIEW  COMPARISON:  August 25, 2019 FINDINGS: The heart size and mediastinal contours are within normal limits. Both lungs are clear. The visualized skeletal structures are unremarkable. IMPRESSION: No active cardiopulmonary disease. Electronically Signed   By: Constance Holster M.D.   On: 09/28/2019 17:13   VAS [place] LOWER EXTREMITY VENOUS (DVT) (ONLY [facility] & [facility])  Result Date: 09/28/2019  Lower Venous Study Indications: Edema.  Comparison Study: 03/09/19 previous Performing Technologist: Abram Sander RVS  Examination Guidelines: A complete evaluation includes B-mode imaging, spectral Doppler, color Doppler, and power Doppler as needed of all accessible portions of each vessel. Bilateral testing is considered an integral part of a complete examination. Limited examinations for reoccurring indications may be performed as noted.  +---------+---------------+---------+-----------+----------+--------------+  RIGHT     Compressibility Phasicity Spontaneity Properties Thrombus Aging  +---------+---------------+---------+-----------+----------+--------------+  CFV       Full            Yes       Yes                                    +---------+---------------+---------+-----------+----------+--------------+  SFJ  Full                                                             +---------+---------------+---------+-----------+----------+--------------+  FV Prox   Full                                                             +---------+---------------+---------+-----------+----------+--------------+  FV Mid    Full                                                             +---------+---------------+---------+-----------+----------+--------------+  FV Distal Full                                                             +---------+---------------+---------+-----------+----------+--------------+  PFV       Full                                                              +---------+---------------+---------+-----------+----------+--------------+  POP       Full            Yes       Yes                                    +---------+---------------+---------+-----------+----------+--------------+  PTV       Full                                                             +---------+---------------+---------+-----------+----------+--------------+  PERO      Full                                                             +---------+---------------+---------+-----------+----------+--------------+   +---------+---------------+---------+-----------+----------+--------------+  LEFT      Compressibility Phasicity Spontaneity Properties Thrombus Aging  +---------+---------------+---------+-----------+----------+--------------+  CFV       Full            Yes       Yes                                    +---------+---------------+---------+-----------+----------+--------------+  SFJ       Full                                                             +---------+---------------+---------+-----------+----------+--------------+  FV Prox   Full                                                             +---------+---------------+---------+-----------+----------+--------------+  FV Mid    Full                                                             +---------+---------------+---------+-----------+----------+--------------+  FV Distal Full                                                             +---------+---------------+---------+-----------+----------+--------------+  PFV       Full                                                             +---------+---------------+---------+-----------+----------+--------------+  POP       Full            Yes       Yes                                    +---------+---------------+---------+-----------+----------+--------------+  PTV       Full                                                              +---------+---------------+---------+-----------+----------+--------------+  PERO      Full                                                             +---------+---------------+---------+-----------+----------+--------------+     Summary: Right: There is no evidence of deep vein thrombosis in the lower extremity. A cystic structure is found in the popliteal fossa. Left: There is no evidence of deep vein thrombosis in the lower extremity. No cystic structure found in the popliteal fossa.  *See table(s) above for measurements and observations. Electronically  signed by Monica Martinez MD on 09/28/2019 at 5:48:04 PM.    Final     Procedures Procedures (including critical care time)  Medications Ordered in ED Medications  potassium chloride SA (KLOR-CON) CR tablet 60 mEq (60 mEq Oral Given 09/28/19 1836)  potassium chloride 10 mEq in 100 mL IVPB (0 mEq Intravenous Stopped 09/28/19 2121)  magnesium oxide (MAG-OX) tablet 400 mg (400 mg Oral Given 09/28/19 1836)    ED Course  I have reviewed the triage vital signs and the nursing notes.  Pertinent labs & imaging results that were available during my care of the patient were reviewed by me and considered in my medical decision making (see chart for details).  Patient seen and examined. Work-up initiated. Medications ordered. EKG ordered.  Will obtain lower extremity ultrasound.  Vital signs reviewed and are as follows: BP (!) 133/94    Pulse 75    Temp 98.5 F (36.9 C) (Oral)    Resp 18    SpO2 100%   Patient found to have potassium of 2.4.  Magnesium checked and is also low.  Patient will receive 60 mEq of oral potassium as well as 2 runs of 10 mEq.  She was updated on results of her lower extremity ultrasounds which did not demonstrate any blood clots.  Patient discussed with and seen by Dr. Ralene Bathe.  Patient is otherwise doing very well.  We agree that patient can be discharged home with continued supplementation and for recheck of her potassium  and magnesium.  Her EKG is normal.  She is otherwise asymptomatic.  Feel that the risk of admission to the hospital during the Covid pandemic outweighs the benefits at this time.  Patient has completed her therapy, rechecked, and is doing well.  She is comfortable discharged home.  Prescriptions written for potassium and magnesium supplements.  She will continue amlodipine for now however we discussed that this could be contributing to her lower extremity swelling.  We discussed elevation of her legs when possible, consider use of compression stockings.  Encouraged return with worsening symptoms, chest pain, shortness of breath, syncope, or other concerns.    MDM Rules/Calculators/A&P                      Patient presents today with complaint of lower extremity swelling bilaterally.  Patient was evaluated.  She does not have any signs of significant heart failure, renal failure, liver failure.  Patient was evaluated for DVTs and was negative.  Patient was recently discontinued but it sounds like she was restarted on calcium channel blocker and this may be contributing.  Patient will follow-up with her doctor regarding this.  Potassium was low at 2.4 however she had a normal EKG and no weakness or other symptoms significant for acute severe hypokalemia.  Patient was repleted as well as magnesium.  She looks good and is appropriate follow-up.   Final Clinical Impression(s) / ED Diagnoses Final diagnoses:  Leg edema  Hypokalemia  Hypomagnesemia    Rx / DC Orders ED Discharge Orders    None       Suann Larry 09/28/19 2149    Quintella Reichert, MD 10/02/19 1447

## 2019-11-18 ENCOUNTER — Other Ambulatory Visit: Payer: Self-pay

## 2019-11-18 ENCOUNTER — Emergency Department (HOSPITAL_COMMUNITY): Payer: Medicare Other

## 2019-11-18 ENCOUNTER — Emergency Department (HOSPITAL_COMMUNITY)
Admission: EM | Admit: 2019-11-18 | Discharge: 2019-11-18 | Disposition: A | Discharge status: Home / Self Care | Payer: Medicare Other | Attending: Emergency Medicine | Admitting: Emergency Medicine

## 2019-11-18 ENCOUNTER — Encounter (HOSPITAL_COMMUNITY): Payer: Self-pay

## 2019-11-18 DIAGNOSIS — Z8673 Personal history of transient ischemic attack (TIA), and cerebral infarction without residual deficits: Secondary | ICD-10-CM | POA: Diagnosis not present

## 2019-11-18 DIAGNOSIS — R079 Chest pain, unspecified: Secondary | ICD-10-CM | POA: Diagnosis present

## 2019-11-18 DIAGNOSIS — Z7982 Long term (current) use of aspirin: Secondary | ICD-10-CM | POA: Diagnosis not present

## 2019-11-18 DIAGNOSIS — Z20822 Contact with and (suspected) exposure to covid-19: Secondary | ICD-10-CM | POA: Diagnosis not present

## 2019-11-18 DIAGNOSIS — I10 Essential (primary) hypertension: Secondary | ICD-10-CM | POA: Diagnosis not present

## 2019-11-18 DIAGNOSIS — Z79899 Other long term (current) drug therapy: Secondary | ICD-10-CM | POA: Diagnosis not present

## 2019-11-18 LAB — BASIC METABOLIC PANEL
Anion gap: 9 (ref 5–15)
BUN: 13 mg/dL (ref 6–20)
CO2: 25 mmol/L (ref 22–32)
Calcium: 8.3 mg/dL — ABNORMAL LOW (ref 8.9–10.3)
Chloride: 108 mmol/L (ref 98–111)
Creatinine, Ser: 0.75 mg/dL (ref 0.44–1.00)
GFR calc Af Amer: >60 mL/min (ref >60)
GFR calc non Af Amer: >60 mL/min (ref >60)
Glucose, Bld: 85 mg/dL (ref 70–99)
Potassium: 3.5 mmol/L (ref 3.5–5.1)
Sodium: 142 mmol/L (ref 135–145)

## 2019-11-18 LAB — HEPATIC FUNCTION PANEL
ALT: 29 U/L (ref 0–44)
AST: 35 U/L (ref 15–41)
Albumin: 2.5 g/dL — ABNORMAL LOW (ref 3.5–5.0)
Alkaline Phosphatase: 83 U/L (ref 38–126)
Bilirubin, Direct: 0.2 mg/dL (ref 0.0–0.2)
Indirect Bilirubin: 0.1 mg/dL — ABNORMAL LOW (ref 0.3–0.9)
Total Bilirubin: 0.3 mg/dL (ref 0.3–1.2)
Total Protein: 5.4 g/dL — ABNORMAL LOW (ref 6.5–8.1)

## 2019-11-18 LAB — CBC
HCT: 33.5 % — ABNORMAL LOW (ref 36.0–46.0)
Hemoglobin: 10.8 g/dL — ABNORMAL LOW (ref 12.0–15.0)
MCH: 30.3 pg (ref 26.0–34.0)
MCHC: 32.2 g/dL (ref 30.0–36.0)
MCV: 93.8 fL (ref 80.0–100.0)
Platelets: 271 10*3/uL (ref 150–400)
RBC: 3.57 MIL/uL — ABNORMAL LOW (ref 3.87–5.11)
RDW: 16.3 % — ABNORMAL HIGH (ref 11.5–15.5)
WBC: 9.8 10*3/uL (ref 4.0–10.5)
nRBC: 0 % (ref 0.0–0.2)

## 2019-11-18 LAB — POC OCCULT BLOOD, ED: Fecal Occult Bld: NEGATIVE

## 2019-11-18 LAB — TROPONIN I (HIGH SENSITIVITY)
Troponin I (High Sensitivity): 21 ng/L — ABNORMAL HIGH (ref <18)
Troponin I (High Sensitivity): 16 ng/L (ref <18)

## 2019-11-18 LAB — POC SARS CORONAVIRUS 2 AG -  ED: SARS Coronavirus 2 Ag: NEGATIVE

## 2019-11-18 LAB — LIPASE, BLOOD: Lipase: 17 U/L (ref 11–51)

## 2019-11-18 MED ORDER — AMLODIPINE BESYLATE 5 MG PO TABS
2.5000 mg | ORAL_TABLET | Freq: Every day | ORAL | Status: DC
  Administered 2019-11-18: 2.5 mg via ORAL
  Filled 2019-11-18: qty 1

## 2019-11-18 MED ORDER — IOHEXOL 350 MG/ML SOLN
100.0000 mL | Freq: Once | INTRAVENOUS | Status: AC | PRN
  Administered 2019-11-18: 100 mL via INTRAVENOUS

## 2019-11-18 MED ORDER — MORPHINE SULFATE (PF) 4 MG/ML IV SOLN
4.0000 mg | Freq: Once | INTRAVENOUS | Status: AC
  Administered 2019-11-18: 4 mg via INTRAVENOUS
  Filled 2019-11-18: qty 1

## 2019-11-18 MED ORDER — ISOSORBIDE MONONITRATE ER 30 MG PO TB24
30.0000 mg | ORAL_TABLET | Freq: Every day | ORAL | Status: DC
  Administered 2019-11-18: 30 mg via ORAL
  Filled 2019-11-18: qty 1

## 2019-11-18 MED ORDER — PANTOPRAZOLE SODIUM 40 MG IV SOLR
40.0000 mg | Freq: Once | INTRAVENOUS | Status: AC
  Administered 2019-11-18: 40 mg via INTRAVENOUS
  Filled 2019-11-18: qty 40

## 2019-11-18 MED ORDER — ONDANSETRON HCL 4 MG/2ML IJ SOLN
4.0000 mg | Freq: Once | INTRAMUSCULAR | Status: AC
  Administered 2019-11-18: 4 mg via INTRAVENOUS
  Filled 2019-11-18: qty 2

## 2019-11-18 MED ORDER — FENTANYL CITRATE (PF) 100 MCG/2ML IJ SOLN
50.0000 ug | Freq: Once | INTRAMUSCULAR | Status: AC
  Administered 2019-11-18: 50 ug via INTRAVENOUS
  Filled 2019-11-18: qty 2

## 2019-11-18 MED ORDER — AMLODIPINE BESYLATE 2.5 MG PO TABS: 2.5000 mg | ORAL_TABLET | Freq: Every day | ORAL | 0 refills | Status: DC

## 2019-11-18 MED ORDER — ISOSORBIDE MONONITRATE ER 30 MG PO TB24: 30.0000 mg | ORAL_TABLET | Freq: Every day | ORAL | 0 refills | Status: AC

## 2019-11-18 NOTE — Discharge Instructions (Addendum)
Please take your new medications, as prescribed.  Follow-up with your primary care provider in 1 week.  Please return to the ED or seek immediate medical attention should you develop any new or worsening symptoms.

## 2019-11-18 NOTE — ED Triage Notes (Signed)
Pt c.o epigastric pain since last night as well as n/v. Pt took 1 Nitro last night with some relief of her pain. Pt denies nausea at this time. Pt arrives to ED alert and oriented, nad noted

## 2019-11-18 NOTE — ED Provider Notes (Signed)
[facility] EMERGENCY DEPARTMENT Provider Note   CSN: [id number] Arrival date & time: 11/18/19  1058     History Chief Complaint  Patient presents with   Chest Pain    Jodi Bowman is a 59 y.o. female with PMH significant for HTN, HLD, OSA, CVA, ACS, and GERD who presents to the ED with central chest/epigastric pain with associated nausea and vomiting.  Patient reports that she was woken from her sleep at 2 AM with 10 out of 10, nonradiating, "squeezing" central chest pain.  She took 1 NTG which helped improve her symptoms, however they quickly returned.  This morning she had nausea and 6 episodes of nonbloody emesis.  She also admits to headache, lightheadedness, and an episode of black stool this morning.  She denies any fevers or chills, room spinning dizziness, numbness or weakness, current nausea, or urinary symptoms.  Patient had left heart cath and coronary angiography performed 08/29/2019 which demonstrated good LV systolic EF 0000000 with minimal plaquing.  She is followed by Dr. Terrence Dupont, cardiology.  HPI     Past Medical History:  Diagnosis Date   Arthritis    Calculus of gallbladder without mention of cholecystitis or obstruction    Dizziness    Fibromyalgia    GERD (gastroesophageal reflux disease)    Goiter    Hypercholesteremia    Hypertension    Lower back pain    Migraine    Nontoxic uninodular goiter    sees dr vollmer at [facility]   Obesity    PONV (postoperative nausea and vomiting)    Sleep apnea    STOPBANG=5   Stroke [facility]) 2000    Patient Active Problem List   Diagnosis Date Noted   ACS (acute coronary syndrome) ([place]) 08/25/2019   Chest pain 08/25/2019   Postoperative seroma involving nervous system after nervous system procedure 04/05/2019   Drainage from wound 04/02/2019   Infective otitis externa of left ear    Left otitis media    AKI (acute kidney injury) ([place])    Urinary retention     Orthostasis    Acute blood loss anemia    Fibromyalgia    Chronic pain syndrome    Lumbar radiculopathy 03/08/2019   Orthostatic hypotension 03/06/2019   Postoperative urinary retention 03/06/2019   Lumbar foraminal stenosis 03/04/2019   S/P exploratory laparotomy 03/06/2018   Bowel obstruction ([place]) 03/06/2018   Chest pain, rule out acute myocardial infarction 12/17/2017   Hypokalemia 12/17/2017   Acute lower UTI 12/17/2017   Vertigo 04/30/2017   Small vessel disease, cerebrovascular 04/30/2017   Chronic low back pain 08/01/2015   Right hip pain 08/01/2015   Abnormality of gait 08/01/2015   Left leg weakness    Low back pain with radiation    Syncope 07/30/2014   Atypical chest pain 03/12/2014   Lap chole IOC April 2013 01/29/2012   Gallstones 12/04/2011   Thyroid nodule-non neoplastic goiter by needle aspiration 12/04/2011   GLUCOSE INTOLERANCE 03/12/2010   DYSLIPIDEMIA 03/12/2010   Chronic migraine 03/12/2010   Carotid stenosis 03/12/2010   CEREBROVASCULAR ACCIDENT 03/12/2010   LIPOMA 01/29/2010   HEADACHE 01/29/2010   ANKLE INJURY, RIGHT 04/12/2009   PHARYNGITIS 03/21/2009   Backache 03/01/2009   ALLERGIC RHINITIS 03/17/2007   LOW BACK PAIN 03/17/2007   Essential hypertension 01/01/2007   ANXIETY STATE NOS 09/11/2005    Past Surgical History:  Procedure Laterality Date   ABDOMINAL HYSTERECTOMY     BOWEL RESECTION N/A 03/06/2018   Procedure:  SMALL BOWEL ANASTAMOSIS;  Surgeon: Clovis Riley, MD;  Location: [facility];  Service: General;  Laterality: N/A;   CATARACT EXTRACTION W/ INTRAOCULAR LENS IMPLANT Bilateral    CESAREAN SECTION  yrs ago   done x 2   CHOLECYSTECTOMY  01/05/2012   Procedure: LAPAROSCOPIC CHOLECYSTECTOMY WITH INTRAOPERATIVE CHOLANGIOGRAM;  Surgeon: Pedro Earls, MD;  Location: [facility];  Service: General;  Laterality: N/A;   COLONOSCOPY  10/08/2012   Procedure: COLONOSCOPY;  Surgeon: Beryle Beams, MD;   Location: [facility];  Service: Endoscopy;  Laterality: N/A;   KNEE ARTHROSCOPY  one 1995 and 1 in 1997   both knees done   LAPAROSCOPY N/A 03/06/2018   Procedure: LAPAROSCOPY DIAGNOSTIC WITH LYSIS OF ADHESIONS;  Surgeon: Clovis Riley, MD;  Location: [facility];  Service: General;  Laterality: N/A;   LAPAROTOMY N/A 03/06/2018   Procedure: EXPLORATORY LAPAROTOMY, RESECTION OF DISTAL ROUX, CLOSURE OF INTERNAL HERNIA;  Surgeon: Clovis Riley, MD;  Location: [facility];  Service: General;  Laterality: N/A;   LEFT HEART CATH AND CORONARY ANGIOGRAPHY N/A 08/29/2019   Procedure: LEFT HEART CATH AND CORONARY ANGIOGRAPHY;  Surgeon: Charolette Forward, MD;  Location: [facility];  Service: Cardiovascular;  Laterality: N/A;   LUMBAR WOUND DEBRIDEMENT N/A 04/03/2019   Procedure: LUMBAR WOUND WASHOUT;  Surgeon: Judith Part, MD;  Location: [facility];  Service: Neurosurgery;  Laterality: N/A;   POSTERIOR LUMBAR FUSION  03/04/2019   surgery for endometriosis  yrs ago   thryoid biopsy  December 01, 2011    at [facility]     OB History   No obstetric history on file.     Family History  Problem Relation Age of Onset   Diabetes Mother    Hypertension Mother    Transient ischemic attack Mother    Seizures Mother    Dementia Mother    Other Father        MVA   Cancer Brother        colon and lung   Cancer Maternal Grandmother        colon    Social History   Tobacco Use   Smoking status: Never Smoker   Smokeless tobacco: Never Used  Substance Use Topics   Alcohol use: Not Currently    Alcohol/week: 0.0 standard drinks   Drug use: No    Home Medications Prior to Admission medications   Medication Sig Start Date End Date Taking? Authorizing Provider  albuterol (PROVENTIL HFA) 108 (90 Base) MCG/ACT inhaler Inhale 1-2 puffs into the lungs every 6 (six) hours as needed for wheezing or shortness of breath.    [provider]  amLODipine (NORVASC) 2.5 MG tablet Take 1  tablet (2.5 mg total) by mouth daily. 11/18/19   Corena Herter, PA-C  aspirin EC 81 MG tablet Take 81 mg by mouth daily.    [provider]  DULoxetine (CYMBALTA) 60 MG capsule Take 60 mg by mouth daily. 08/11/19   [provider]  ferrous sulfate 325 (65 FE) MG EC tablet Take 325 mg by mouth daily. 07/26/19   [provider]  fluticasone (FLONASE) 50 MCG/ACT nasal spray Place 1 spray into both nostrils daily as needed for allergies.  11/08/17   [provider]  folic acid (FOLVITE) 1 MG tablet Take 1 mg by mouth daily. 10/14/17   [provider]  isosorbide mononitrate (IMDUR) 30 MG 24 hr tablet Take 1 tablet (30 mg total) by mouth daily. 11/18/19  Corena Herter, PA-C  KLOR-CON M20 20 MEQ tablet Take 20 mEq by mouth 2 (two) times daily. 07/20/19   [provider]  loratadine (CLARITIN) 10 MG tablet Take 10 mg by mouth daily.     [provider]  Magnesium Oxide 400 MG CAPS Take 1 capsule (400 mg total) by mouth daily. 09/28/19   Carlisle Cater, PA-C  methocarbamol (ROBAXIN) 500 MG tablet Take 1 tablet (500 mg total) by mouth every 6 (six) hours as needed for muscle spasms. 03/15/19   Angiulli, Lavon Paganini, PA-C  metoprolol succinate (TOPROL-XL) 100 MG 24 hr tablet Take 100 mg by mouth at bedtime.  07/10/19   [provider]  nitroGLYCERIN (NITROSTAT) 0.4 MG SL tablet Place 1 tablet (0.4 mg total) under the tongue every 5 (five) minutes x 3 doses as needed for chest pain. Patient taking differently: Place 0.4 mg under the tongue every 5 (five) minutes as needed for chest pain (MAXIMUM OF 3 DOSES).  03/13/14   Charolette Forward, MD  oxyCODONE (OXY IR/ROXICODONE) 5 MG immediate release tablet Take 1-2 tablets by mouth every 6 (six) hours as needed for pain. 08/12/19   [provider]  pantoprazole (PROTONIX) 40 MG tablet Take 1 tablet (40 mg total) by mouth 2 (two) times daily. 03/15/19   Angiulli, Lavon Paganini, PA-C  polyethylene  glycol (MIRALAX / GLYCOLAX) 17 g packet Take 17 g by mouth daily as needed for mild constipation. 03/08/19   Viona Gilmore D, NP  potassium chloride SA (KLOR-CON) 20 MEQ tablet Take 1 tablet (20 mEq total) by mouth 2 (two) times daily. 09/28/19   Carlisle Cater, PA-C  pregabalin (LYRICA) 75 MG capsule Take 1 capsule (75 mg total) by mouth 3 (three) times daily. 03/15/19   Angiulli, Lavon Paganini, PA-C  Vitamin D, Ergocalciferol, (DRISDOL) 1.25 MG (50000 UT) CAPS capsule Take 1 capsule (50,000 Units total) by mouth every Monday, Wednesday, and Friday. 03/16/19   Angiulli, Lavon Paganini, PA-C  metoprolol (LOPRESSOR) 50 MG tablet Take 50 mg by mouth 2 (two) times daily.    12/04/11  [provider]  simvastatin (ZOCOR) 20 MG tablet Take 20 mg by mouth at bedtime.    12/04/11  [provider]    Allergies    Shrimp [shellfish allergy] and Naproxen  Review of Systems   Review of Systems  Constitutional: Negative for chills and fever.  Respiratory: Negative for cough.   Cardiovascular: Positive for chest pain. Negative for leg swelling.  Gastrointestinal: Positive for abdominal pain, blood in stool, nausea and vomiting.  Musculoskeletal: Negative for back pain.  Neurological: Positive for light-headedness and headaches. Negative for dizziness, weakness and numbness.      Physical Exam Updated Vital Signs BP (!) 157/77    Pulse 97    Temp 98.1 F (36.7 C) (Oral)    Resp 14    Ht 4\' 11"  (1.499 m)    Wt 65.8 kg    SpO2 98%    BMI 29.29 kg/m   Physical Exam Vitals and nursing note reviewed. Exam conducted with a chaperone present.  Constitutional:      Appearance: Normal appearance.  HENT:     Head: Normocephalic and atraumatic.  Eyes:     General: No scleral icterus.    Conjunctiva/sclera: Conjunctivae normal.  Cardiovascular:     Rate and Rhythm: Normal rate and regular rhythm.     Pulses: Normal pulses.     Heart sounds: Normal heart sounds.  Pulmonary:  Comments: Breath  sounds intact bilaterally.  Mildly increased respiratory effort.  No respiratory distress.  No abnormal breath sounds appreciated. Abdominal:     Comments: Soft, nondistended.  TTP in RUQ, LUQ, periumbilical, but most notably in epigastric region.  No significant TTP in lower abdomen.  No guarding.  No overlying skin changes.  No masses appreciated.  Musculoskeletal:     Cervical back: Normal range of motion and neck supple. No rigidity.  Skin:    General: Skin is dry.     Capillary Refill: Capillary refill takes less than 2 seconds.  Neurological:     Mental Status: She is alert and oriented to person, place, and time.     GCS: GCS eye subscore is 4. GCS verbal subscore is 5. GCS motor subscore is 6.  Psychiatric:        Mood and Affect: Mood normal.        Behavior: Behavior normal.        Thought Content: Thought content normal.     ED Results / Procedures / Treatments   Labs (all labs ordered are listed, but only abnormal results are displayed) Labs Reviewed  BASIC METABOLIC PANEL - Abnormal; Notable for the following components:      Result Value   Calcium 8.3 (*)    All other components within normal limits  CBC - Abnormal; Notable for the following components:   RBC 3.57 (*)    Hemoglobin 10.8 (*)    HCT 33.5 (*)    RDW 16.3 (*)    All other components within normal limits  HEPATIC FUNCTION PANEL - Abnormal; Notable for the following components:   Total Protein 5.4 (*)    Albumin 2.5 (*)    Indirect Bilirubin 0.1 (*)    All other components within normal limits  TROPONIN I (HIGH SENSITIVITY) - Abnormal; Notable for the following components:   Troponin I (High Sensitivity) 21 (*)    All other components within normal limits  LIPASE, BLOOD  POC OCCULT BLOOD, ED  POC SARS CORONAVIRUS 2 AG -  ED  TROPONIN I (HIGH SENSITIVITY)    EKG None  Radiology DG Chest 2 View  Result Date: 11/18/2019 CLINICAL DATA:  Chest pain EXAM: CHEST - 2 VIEW COMPARISON:  08/25/2019  FINDINGS: Normal heart size and mediastinal contours. No acute infiltrate or edema. No effusion or pneumothorax. No acute osseous findings. Artifact from EKG leads. IMPRESSION: No active cardiopulmonary disease. Electronically Signed   By: Monte Fantasia M.D.   On: 11/18/2019 11:38   CT Angio Chest/Abd/Pel for Dissection W and/or Wo Contrast  Result Date: 11/18/2019 CLINICAL DATA:  59 year old female with chest pain radiating to the back. EXAM: CT ANGIOGRAPHY CHEST, ABDOMEN AND PELVIS TECHNIQUE: Multidetector CT imaging through the chest, abdomen and pelvis was performed using the standard protocol during bolus administration of intravenous contrast. Multiplanar reconstructed images and MIPs were obtained and reviewed to evaluate the vascular anatomy. CONTRAST:  152mL OMNIPAQUE IOHEXOL 350 MG/ML SOLN COMPARISON:  CTA chest abdomen and pelvis 08/25/2019 FINDINGS: CTA CHEST FINDINGS Cardiovascular: No calcified coronary artery atherosclerosis is evident. No cardiomegaly or pericardial effusion. Four vessel arch configuration, the left vertebral arises directly from the arch distal to the left subclavian. No significant arch or proximal great vessel atherosclerosis. No significant thoracic aorta atherosclerosis. Negative for aortic dissection or aneurysm. Central and hilar pulmonary arteries also seem to be patent. Mediastinum/Nodes: Stable multinodular thyroid goiter. No mediastinal or hilar lymphadenopathy. Lungs/Pleura: Mild mass effect on the trachea  from the chronic thyroid goiter is stable. Major airways are patent. There are trace new bilateral pleural effusions, slightly larger on the left, but no abnormal pulmonary opacity. Musculoskeletal: Stable and negative for age. Review of the MIP images confirms the above findings. CTA ABDOMEN AND PELVIS FINDINGS VASCULAR Negative for abdominal aortic aneurysm or dissection. Mild for age aortoiliac atherosclerosis. Major aortic branches are patent. Iliac arteries  are patent without dissection or aneurysm. Proximal femoral arteries are patent. Review of the MIP images confirms the above findings. NON-VASCULAR Hepatobiliary: Chronic surgically absent gallbladder. Stable liver and bile ducts. Pancreas: Negative. Spleen: Negative. Adrenals/Urinary Tract: The right adrenal gland remains difficult to delineate, no definite adrenal mass. The left adrenal is normal. There is a small chronic and simple appearing right renal upper pole cyst. Symmetric renal enhancement. Renal vascular calcifications with no definite nephrolithiasis. Proximal ureters are decompressed. Numerous pelvic phleboliths are hree identified. There is a dystrophic calcification along the left [place] of the bladder which is unchanged. Unremarkable bladder otherwise. Stomach/Bowel: Increased retained stool in the rectum. No rectal wall thickening. Mildly redundant sigmoid colon containing gas. Similar redundancy of the splenic flexure. Mild retained stool in the transverse and right colon. Normal appendix on series 6, image 126 coursing to the midline. No large bowel inflammation. Negative terminal ileum. No dilated small bowel. Postoperative small bowel anastomosis in the left abdomen on series 6, image 117 with no adverse features. 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 A 2nd small bowel anastomosis is in the anterior left upper quadrant on series 6, image 83 with no adverse features. The bypassed portion of the stomach is decompressed. Diminutive duodenum. No free air. No free fluid. Lymphatic: No lymphadenopathy. Reproductive: Absent uterus. Diminutive or absent ovaries. Other: No pelvic free fluid. Musculoskeletal: Chronic lumbar spine degeneration and lower lumbar decompression and fusion. Hardware appears stable. No acute osseous abnormality identified. Review of the MIP images confirms the above findings. IMPRESSION: 1. Stable CTA since November, negative for aneurysm or dissection. Mild  for age atherosclerosis. 2. Trace new bilateral pleural effusions, but no other acute or inflammatory pulmonary process. 3. Largely stable CT appearance of the abdomen and pelvis. Increased retained stool in the rectum. Postoperative changes to the stomach and small bowel. Normal appendix. 4. Stable chronic multinodular thyroid goiter. Electronically Signed   By: Genevie Ann M.D.   On: 11/18/2019 16:11    Procedures Procedures (including critical care time)  Medications Ordered in ED Medications  isosorbide mononitrate (IMDUR) 24 hr tablet 30 mg (has no administration in time range)  amLODipine (NORVASC) tablet 2.5 mg (has no administration in time range)  ondansetron (ZOFRAN) injection 4 mg (4 mg Intravenous Given 11/18/19 1207)  pantoprazole (PROTONIX) injection 40 mg (40 mg Intravenous Given 11/18/19 1300)  fentaNYL (SUBLIMAZE) injection 50 mcg (50 mcg Intravenous Given 11/18/19 1506)  iohexol (OMNIPAQUE) 350 MG/ML injection 100 mL (100 mLs Intravenous Contrast Given 11/18/19 1540)  morphine 4 MG/ML injection 4 mg (4 mg Intravenous Given 11/18/19 1725)    ED Course  I have reviewed the triage vital signs and the nursing notes.  Pertinent labs & imaging results that were available during my care of the patient were reviewed by me and considered in my medical decision making (see chart for details).  Clinical Course as of Nov 17 1926  Fri Nov 18, 2019  1745 Spoke with Dr. Doylene Canard, covering for Dr. Terrence Dupont, who will come evaluate the patient and provide their thoughts regarding disposition.   [GG]  Clinical Course User Index [GG] Corena Herter, PA-C   MDM Rules/Calculators/A&P                      DG chest 2 view obtained demonstrates no active cardiopulmonary disease.  EKG demonstrates normal sinus rhythm.  CBC demonstrates anemia with hemoglobin of 10.8, however that appears to be improved from her baseline.  Reviewed patient's chart.  CT angio of chest from 08/25/2019 revealed no  evidence of PE or other acute abnormalities.  She had bilateral DVT study obtained 09/28/2019 which was negative for DVT.  She had a D-dimer elevated to 2.72 on 03/13/2019 and would not be useful for PE assessment.  Given her reported 10 out of 10 pain however with radiation to back, discussed with Dr. Reather Converse and will move forward with repeat CTA chest and abdomen to evaluate for PE or dissection.   On reevaluation, patient is still endorsing significant central chest "squeezing" pain with radiation to the back despite fentanyl and unremarkable work-up.  Will provide additional analgesia and consult with Dr. Terrence Dupont, her cardiologist.    I spoke with Dr. Doylene Canard, covering for Dr. Terrence Dupont, who will come evaluate the patient and provide their thoughts regarding disposition.   7:28 PM Plan as dictated by Dr. Doylene Canard: Add long acting nitroglycerin, Imdur 30 mg one daily for angina pain. Add low dose amlodipine- 2.5 mg. one daily also for angina relief and HTN control. F/U by primary care in 1 week.  Discussed plan with patient and she voiced understanding and agreement.  Return precautions discussed.  All of the evaluation and work-up results were discussed with the patient and any family at bedside. They were provided opportunity to ask any additional questions and have none at this time. They have expressed understanding of verbal discharge instructions as well as return precautions and are agreeable to the plan.    Final Clinical Impression(s) / ED Diagnoses Final diagnoses:  Nonspecific chest pain    Rx / DC Orders ED Discharge Orders         Ordered    isosorbide mononitrate (IMDUR) 30 MG 24 hr tablet  Daily     11/18/19 1927    amLODipine (NORVASC) 2.5 MG tablet  Daily     11/18/19 1927           Reita Chard 11/18/19 1928    Elnora Morrison, MD 11/19/19 [phone]

## 2019-11-18 NOTE — Consult Note (Signed)
Referring Physician: J Zavitz/Garrett, PA  Jodi Bowman is an 59 y.o. female.                       Chief Complaint: Chest pain  HPI: 59 years old black female with PMH of hypertension, hyperlipidemia, OSA, morbid obesity, stroke, Gastric bypass surgery, GERD, fibromyalgia, anemia of chronic disease and degenerative joint disease had chest pain that woke her up. She took SL NTG with relief but chest pain came back. She also had headache, nausea, vomiting and one episode of black stool this AM. No fever or chills. Her recent cardiac cath had minimal coronary artery disease. Her stool for occult blood was negative. Her HS-troponin I levels are near normal. Her Hgb is stable. Her CT angio chest/Abdomen/pelvis is also without aneurysm or dissection.   Past Medical History:  Diagnosis Date  . Arthritis   . Calculus of gallbladder without mention of cholecystitis or obstruction   . Dizziness   . Fibromyalgia   . GERD (gastroesophageal reflux disease)   . Goiter   . Hypercholesteremia   . Hypertension   . Lower back pain   . Migraine   . Nontoxic uninodular goiter    sees dr vollmer at [facility]  . Obesity   . PONV (postoperative nausea and vomiting)   . Sleep apnea    STOPBANG=5  . Stroke [facility]) 2000      Past Surgical History:  Procedure Laterality Date  . ABDOMINAL HYSTERECTOMY    . BOWEL RESECTION N/A 03/06/2018   Procedure: SMALL BOWEL ANASTAMOSIS;  Surgeon: Clovis Riley, MD;  Location: [place];  Service: General;  Laterality: N/A;  . CATARACT EXTRACTION W/ INTRAOCULAR LENS IMPLANT Bilateral   . CESAREAN SECTION  yrs ago   done x 2  . CHOLECYSTECTOMY  01/05/2012   Procedure: LAPAROSCOPIC CHOLECYSTECTOMY WITH INTRAOPERATIVE CHOLANGIOGRAM;  Surgeon: Pedro Earls, MD;  Location: [facility];  Service: General;  Laterality: N/A;  . COLONOSCOPY  10/08/2012   Procedure: COLONOSCOPY;  Surgeon: Beryle Beams, MD;  Location: [facility];  Service: Endoscopy;  Laterality: N/A;  .  KNEE ARTHROSCOPY  one 1995 and 1 in 1997   both knees done  . LAPAROSCOPY N/A 03/06/2018   Procedure: LAPAROSCOPY DIAGNOSTIC WITH LYSIS OF ADHESIONS;  Surgeon: Clovis Riley, MD;  Location: [place];  Service: General;  Laterality: N/A;  . LAPAROTOMY N/A 03/06/2018   Procedure: EXPLORATORY LAPAROTOMY, RESECTION OF DISTAL ROUX, CLOSURE OF INTERNAL HERNIA;  Surgeon: Clovis Riley, MD;  Location: [place];  Service: General;  Laterality: N/A;  . LEFT HEART CATH AND CORONARY ANGIOGRAPHY N/A 08/29/2019   Procedure: LEFT HEART CATH AND CORONARY ANGIOGRAPHY;  Surgeon: Charolette Forward, MD;  Location: [place] [facility];  Service: Cardiovascular;  Laterality: N/A;  . LUMBAR WOUND DEBRIDEMENT N/A 04/03/2019   Procedure: LUMBAR WOUND WASHOUT;  Surgeon: Judith Part, MD;  Location: [place];  Service: Neurosurgery;  Laterality: N/A;  . POSTERIOR LUMBAR FUSION  03/04/2019  . surgery for endometriosis  yrs ago  . thryoid biopsy  December 01, 2011    at [facility]    Family History  Problem Relation Age of Onset  . Diabetes Mother   . Hypertension Mother   . Transient ischemic attack Mother   . Seizures Mother   . Dementia Mother   . Other Father        MVA  . Cancer Brother        colon and  lung  . Cancer Maternal Grandmother        colon   Social History:  reports that she has never smoked. She has never used smokeless tobacco. She reports previous alcohol use. She reports that she does not use drugs.  Allergies:  Allergies  Allergen Reactions  . Shrimp [Shellfish Allergy] Anaphylaxis  . Naproxen Other (See Comments)    Makes stomach cramp and burn badly    (Not in a hospital admission)   Results for orders placed or performed during the hospital encounter of 11/18/19 (from the past 48 hour(s))  Basic metabolic panel     Status: Abnormal   Collection Time: 11/18/19 11:15 AM  Result Value Ref Range   Sodium 142 135 - 145 mmol/L   Potassium 3.5 3.5 - 5.1 mmol/L   Chloride 108 98 - 111 mmol/L    CO2 25 22 - 32 mmol/L   Glucose, Bld 85 70 - 99 mg/dL   BUN 13 6 - 20 mg/dL   Creatinine, Ser 0.75 0.44 - 1.00 mg/dL   Calcium 8.3 (L) 8.9 - 10.3 mg/dL   GFR calc non Af Amer >60 >60 mL/min   GFR calc Af Amer >60 >60 mL/min   Anion gap 9 5 - 15    Comment: Performed at [facility], [place] [address] [place], [place] [address]  CBC     Status: Abnormal   Collection Time: 11/18/19 11:15 AM  Result Value Ref Range   WBC 9.8 4.0 - 10.5 K/uL   RBC 3.57 (L) 3.87 - 5.11 MIL/uL   Hemoglobin 10.8 (L) 12.0 - 15.0 g/dL   HCT 33.5 (L) 36.0 - 46.0 %   MCV 93.8 80.0 - 100.0 fL   MCH 30.3 26.0 - 34.0 pg   MCHC 32.2 30.0 - 36.0 g/dL   RDW 16.3 (H) 11.5 - 15.5 %   Platelets 271 150 - 400 K/uL   nRBC 0.0 0.0 - 0.2 %    Comment: Performed at [facility], [place] [address] [place], [place] [address]  Troponin I (High Sensitivity)     Status: Abnormal   Collection Time: 11/18/19 11:15 AM  Result Value Ref Range   Troponin I (High Sensitivity) 21 (H) <18 ng/L    Comment: (NOTE) Elevated high sensitivity troponin I (hsTnI) values and significant  changes across serial measurements may suggest ACS but many other  chronic and acute conditions are known to elevate hsTnI results.  Refer to the "Links" section for chest pain algorithms and additional  guidance. Performed at [facility], [place] [address], [place], [place] [address]   Hepatic function panel     Status: Abnormal   Collection Time: 11/18/19 11:22 AM  Result Value Ref Range   Total Protein 5.4 (L) 6.5 - 8.1 g/dL   Albumin 2.5 (L) 3.5 - 5.0 g/dL   AST 35 15 - 41 U/L   ALT 29 0 - 44 U/L   Alkaline Phosphatase 83 38 - 126 U/L   Total Bilirubin 0.3 0.3 - 1.2 mg/dL   Bilirubin, Direct 0.2 0.0 - 0.2 mg/dL   Indirect Bilirubin 0.1 (L) 0.3 - 0.9 mg/dL    Comment: Performed at [place] [address] [place],  [address]  Lipase, blood     Status: None   Collection Time: 11/18/19 11:22 AM  Result Value Ref  Range   Lipase 17 11 - 51 U/L    Comment: Performed at [facility], [place] [address] [place], [place] [address]  POC occult blood, ED     Status: None   Collection Time: 11/18/19 12:20 PM  Result Value Ref Range   Fecal Occult Bld NEGATIVE NEGATIVE  Troponin I (High Sensitivity)     Status: None   Collection Time: 11/18/19  1:00 PM  Result Value Ref Range   Troponin I (High Sensitivity) 16 <18 ng/L    Comment: (NOTE) Elevated high sensitivity troponin I (hsTnI) values and significant  changes across serial measurements may suggest ACS but many other  chronic and acute conditions are known to elevate hsTnI results.  Refer to the Links section for chest pain algorithms and additional  guidance. Performed at [facility], [place] [address] [place], [place] [address]    DG Chest 2 View  Result Date: 11/18/2019 CLINICAL DATA:  Chest pain EXAM: CHEST - 2 VIEW COMPARISON:  08/25/2019 FINDINGS: Normal heart size and mediastinal contours. No acute infiltrate or edema. No effusion or pneumothorax. No acute osseous findings. Artifact from EKG leads. IMPRESSION: No active cardiopulmonary disease. Electronically Signed   By: Monte Fantasia M.D.   On: 11/18/2019 11:38   CT Angio Chest/Abd/Pel for Dissection W and/or Wo Contrast  Result Date: 11/18/2019 CLINICAL DATA:  59 year old female with chest pain radiating to the back. EXAM: CT ANGIOGRAPHY CHEST, ABDOMEN AND PELVIS TECHNIQUE: Multidetector CT imaging through the chest, abdomen and pelvis was performed using the standard protocol during bolus administration of intravenous contrast. Multiplanar reconstructed images and MIPs were obtained and reviewed to evaluate the vascular anatomy. CONTRAST:  153mL OMNIPAQUE IOHEXOL 350 MG/ML SOLN COMPARISON:  CTA chest abdomen and pelvis 08/25/2019 FINDINGS: CTA CHEST FINDINGS Cardiovascular: No calcified coronary artery atherosclerosis is evident. No cardiomegaly or pericardial effusion. Four vessel  arch configuration, the left vertebral arises directly from the arch distal to the left subclavian. No significant arch or proximal great vessel atherosclerosis. No significant thoracic aorta atherosclerosis. Negative for aortic dissection or aneurysm. Central and hilar pulmonary arteries also seem to be patent. Mediastinum/Nodes: Stable multinodular thyroid goiter. No mediastinal or hilar lymphadenopathy. Lungs/Pleura: Mild mass effect on the trachea from the chronic thyroid goiter is stable. Major airways are patent. There are trace new bilateral pleural effusions, slightly larger on the left, but no abnormal pulmonary opacity. Musculoskeletal: Stable and negative for age. Review of the MIP images confirms the above findings. CTA ABDOMEN AND PELVIS FINDINGS VASCULAR Negative for abdominal aortic aneurysm or dissection. Mild for age aortoiliac atherosclerosis. Major aortic branches are patent. Iliac arteries are patent without dissection or aneurysm. Proximal femoral arteries are patent. Review of the MIP images confirms the above findings. NON-VASCULAR Hepatobiliary: Chronic surgically absent gallbladder. Stable liver and bile ducts. Pancreas: Negative. Spleen: Negative. Adrenals/Urinary Tract: The right adrenal gland remains difficult to delineate, no definite adrenal mass. The left adrenal is normal. There is a small chronic and simple appearing right renal upper pole cyst. Symmetric renal enhancement. Renal vascular calcifications with no definite nephrolithiasis. Proximal ureters are decompressed. Numerous pelvic phleboliths are hree identified. There is a dystrophic calcification along the left [place] of the bladder which is unchanged. Unremarkable bladder otherwise. Stomach/Bowel: Increased retained stool in the rectum. No rectal wall thickening. Mildly redundant sigmoid colon containing gas. Similar redundancy of the splenic flexure. Mild retained stool in the transverse and right colon. Normal appendix on  series 6, image 126 coursing to the midline. No large bowel inflammation. Negative terminal ileum. No dilated small bowel. Postoperative small bowel  anastomosis in the left abdomen on series 6, image 117 with no adverse features. 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 A 2nd small bowel anastomosis is in the anterior left upper quadrant on series 6, image 83 with no adverse features. The bypassed portion of the stomach is decompressed. Diminutive duodenum. No free air. No free fluid. Lymphatic: No lymphadenopathy. Reproductive: Absent uterus. Diminutive or absent ovaries. Other: No pelvic free fluid. Musculoskeletal: Chronic lumbar spine degeneration and lower lumbar decompression and fusion. Hardware appears stable. No acute osseous abnormality identified. Review of the MIP images confirms the above findings. IMPRESSION: 1. Stable CTA since November, negative for aneurysm or dissection. Mild for age atherosclerosis. 2. Trace new bilateral pleural effusions, but no other acute or inflammatory pulmonary process. 3. Largely stable CT appearance of the abdomen and pelvis. Increased retained stool in the rectum. Postoperative changes to the stomach and small bowel. Normal appendix. 4. Stable chronic multinodular thyroid goiter. Electronically Signed   By: Genevie Ann M.D.   On: 11/18/2019 16:11    Review Of Systems Constitutional: No fever, chills, Positive chronic weight gain. Eyes: No vision change, wears glasses. No discharge or pain. Ears: No hearing loss, No tinnitus. Respiratory: No asthma, COPD, pneumonias. Positive shortness of breath. No hemoptysis. Cardiovascular: Positive chest pain, palpitation, leg edema. Gastrointestinal: Positive nausea, vomiting, diarrhea, constipation. No GI bleed. No hepatitis. Genitourinary: No dysuria, hematuria, kidney stone. No incontinance. Neurological: Positive headache, stroke, no seizures.  Psychiatry: No psych facility admission for anxiety,  depression, suicide. No detox. Skin: No rash. Musculoskeletal: Positive joint pain, fibromyalgia. No neck pain, back pain. Lymphadenopathy: No lymphadenopathy. Hematology: Positive anemia, no easy bruising.   Blood pressure (!) 151/81, pulse 92, temperature 98.1 F (36.7 C), temperature source Oral, resp. rate 14, height 4\' 11"  (1.499 m), weight 65.8 kg, SpO2 99 %. Body mass index is 29.29 kg/m. General appearance: alert, cooperative, appears stated age and no distress Head: Normocephalic, atraumatic. Eyes: Brown eyes, Pale pink conjunctiva, corneas clear. PERRL, EOM's intact. Neck: No adenopathy, no carotid bruit, no JVD, supple, symmetrical, trachea midline and thyroid not enlarged. Resp: Clear to auscultation bilaterally. Cardio: Regular rate and rhythm, S1, S2 normal, II/VI systolic murmur, no click, rub or gallop GI: Soft, non-tender; bowel sounds normal; no organomegaly. Extremities: No edema, cyanosis or clubbing. Skin: Warm and dry.  Neurologic: Alert and oriented X 3, normal strength. Normal coordination.  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 Add low dose amlodipine- 2.5 mg. one daily also for angina relief and HTN control. F/U by primary care in 1 week.   Time spent: Review of old records, Lab, x-rays, EKG, other cardiac tests, examination, discussion with patient over 60 minutes.  Birdie Riddle, MD  11/18/2019, 6:29 PM

## 2019-11-18 NOTE — ED Notes (Signed)
Pt returned from CXR at this time. Placed pt back on monitor, pt placed in position of comfort and advised of care plan.

## 2019-11-18 NOTE — ED Notes (Signed)
Pt transported to CXR at this time.

## 2020-02-03 IMAGING — RF LUMBAR SPINE - 2-3 VIEW
1 series · 2 of 2 positions shown · non-contrast
Comparison: Radiographs November 04, 2018.

CLINICAL DATA: Surgical posterior fusion of L4-5 and L5-S1.

EXAM:
DG C-ARM 61-120 MIN; LUMBAR SPINE - 2-3 VIEW
FLUOROSCOPY TIME:  40 seconds.

[Series 1: run · 2 of 2 slices shown]
[im 1/2]
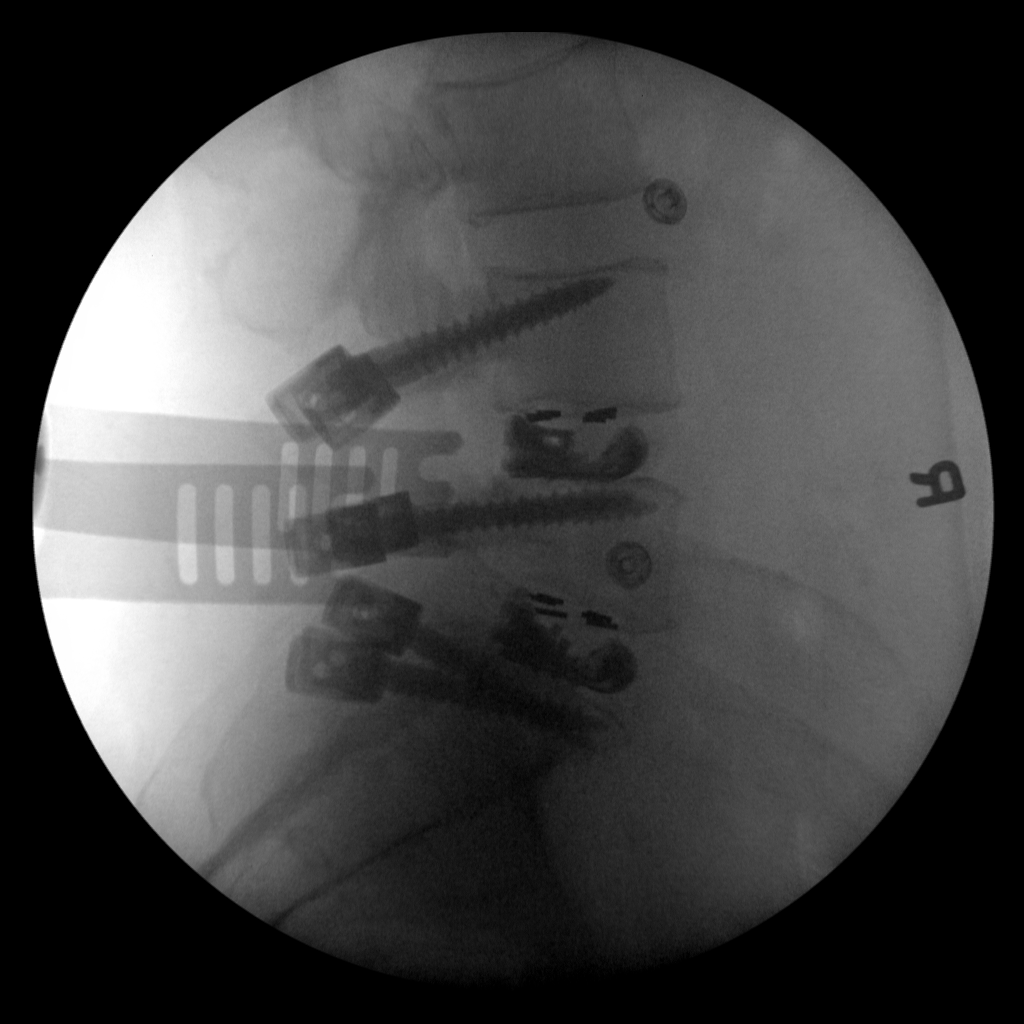
[im 2/2]
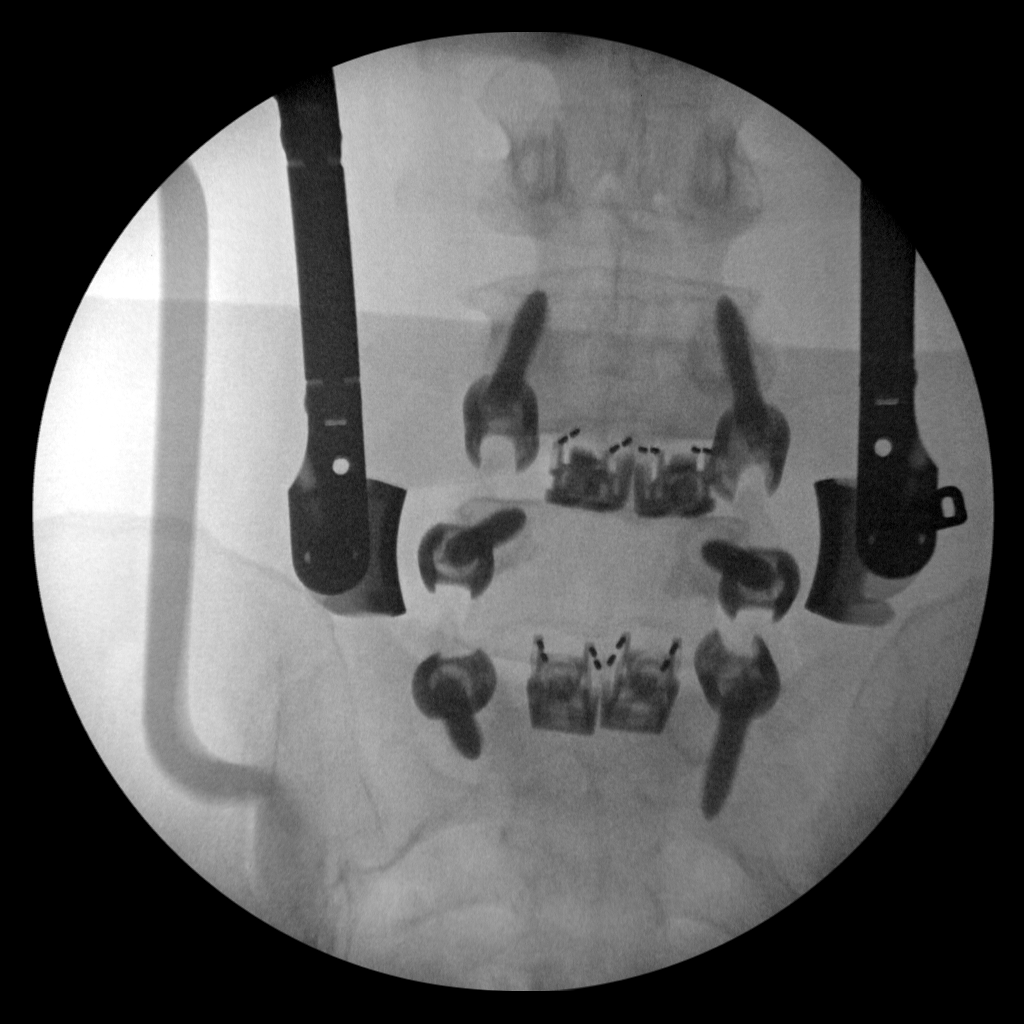

[2 of 2 positions shown; findings below may reference images not displayed]

FINDINGS: Two intraoperative fluoroscopic images of the lower lumbar spine
demonstrate the patient be status post surgical posterior fusion of
L4-5 and L5-S1 with bilateral intrapedicular screw placement and
interbody fusion.
IMPRESSION: Fluoroscopic guidance was provided during surgical posterior fusion
of L4-5 and L5-S1.

## 2020-03-03 IMAGING — MR MRI LUMBAR SPINE WITHOUT AND WITH CONTRAST
4 of 7 series · 25 of 48 positions shown · IV contrast (Gadavist)
Comparison: MRI 11/16/2018

CLINICAL DATA: Back pain and left leg numbness since surgery
03/04/2019. Incision site has been draining.

EXAM:
MRI LUMBAR SPINE WITHOUT AND WITH CONTRAST
TECHNIQUE: Multiplanar and multiecho pulse sequences of the lumbar spine were
obtained without and with intravenous contrast.
CONTRAST:  6 cc Gadavist

[Series 5: T2 · sagittal · 4.0mm · 0.73mm/px · 6 of 16 slices shown (1 of 2)]
[im 1/16]
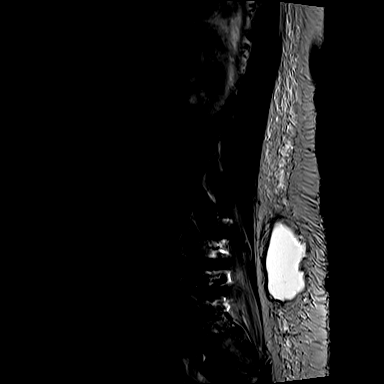
[im 4/16]
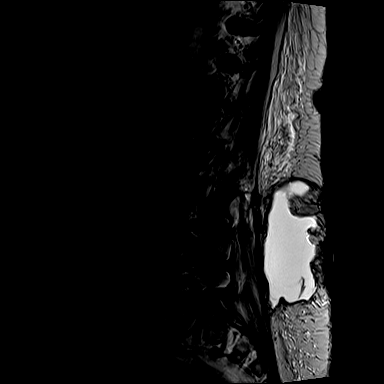
[im 7/16]
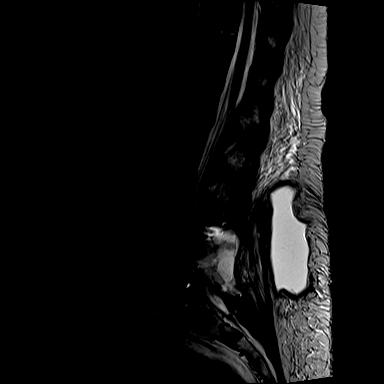
[im 10/16]
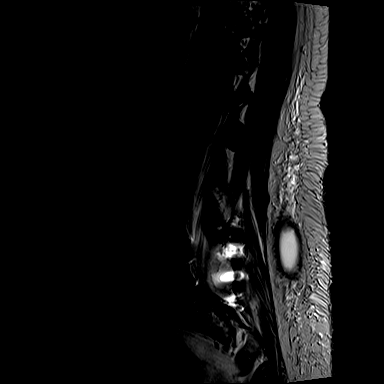
[im 13/16]
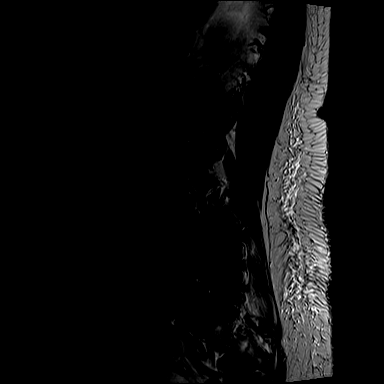
[im 16/16]
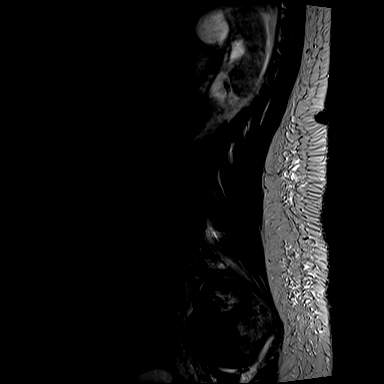

[Series 7: T1 · sagittal · 4.0mm · 0.88mm/px · 5 of 16 slices shown (1 of 2)]
[im 1/16]
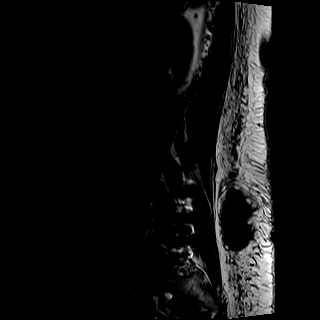
[im 4/16]
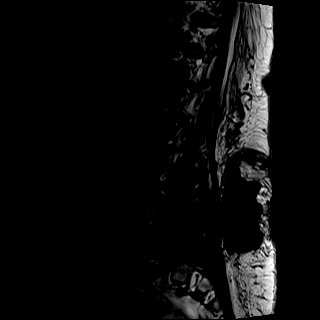
[im 8/16]
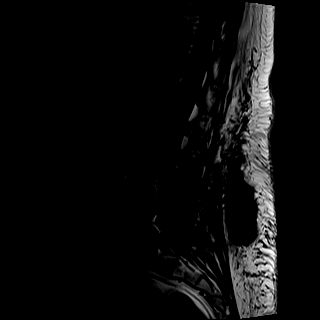
[im 12/16]
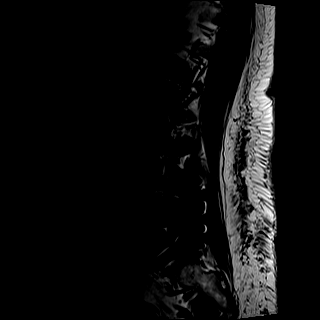
[im 16/16]
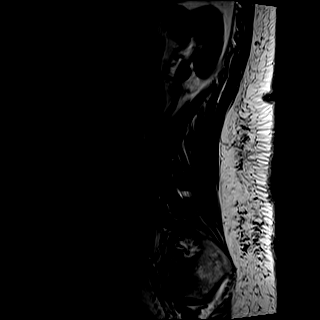

[Series 8: T2 · axial · 4.0mm · 0.57mm/px · z∈[-119,+54]mm · 9 of 30 slices shown (2 of 2)]
[im 1/30]
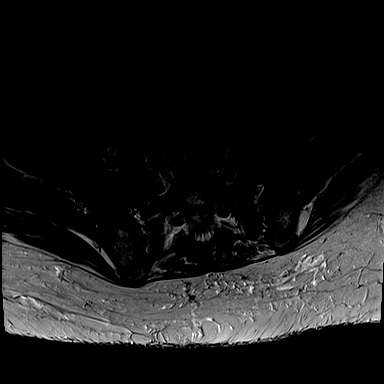
[im 4/30]
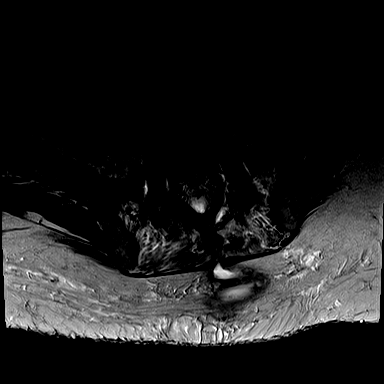
[im 8/30]
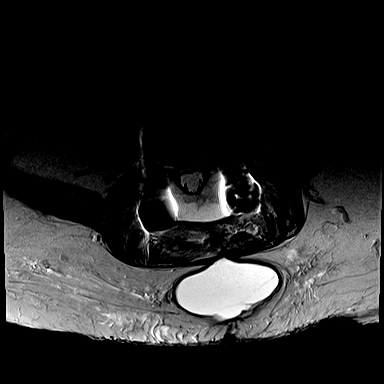
[im 11/30]
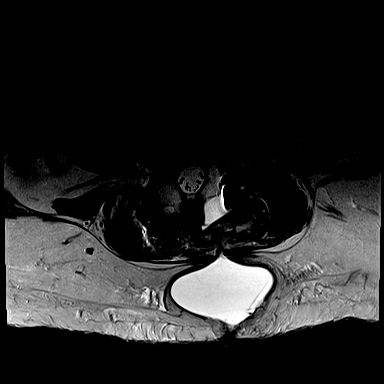
[im 15/30]
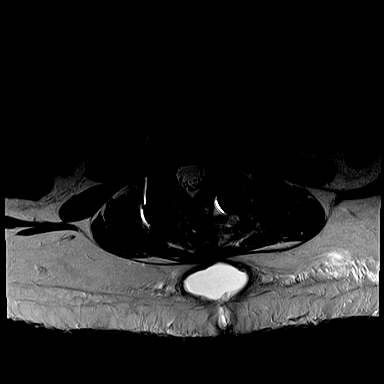
[im 19/30]
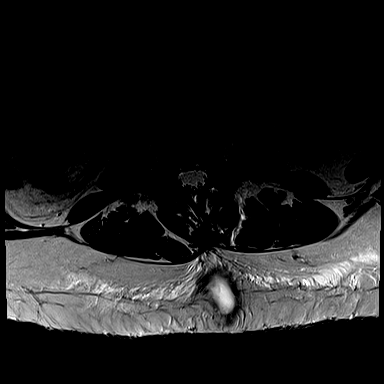
[im 22/30]
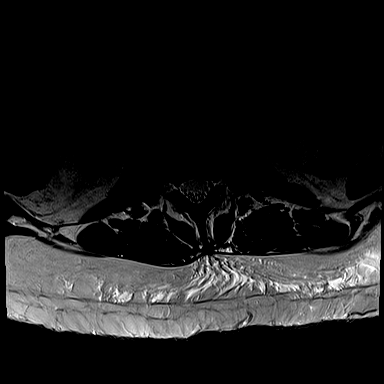
[im 26/30]
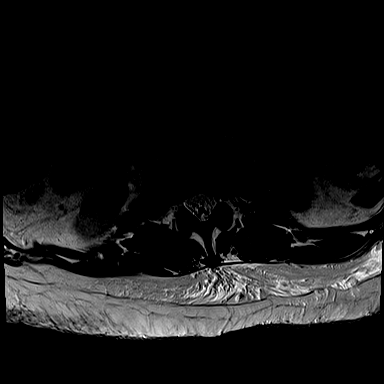
[im 30/30]
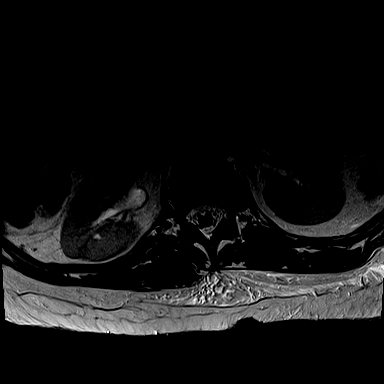

[Series 9: T1 · axial · 4.0mm · 0.34mm/px · z∈[-119,+30]mm · 5 of 30 slices shown (2 of 2)]
[im 1/30]
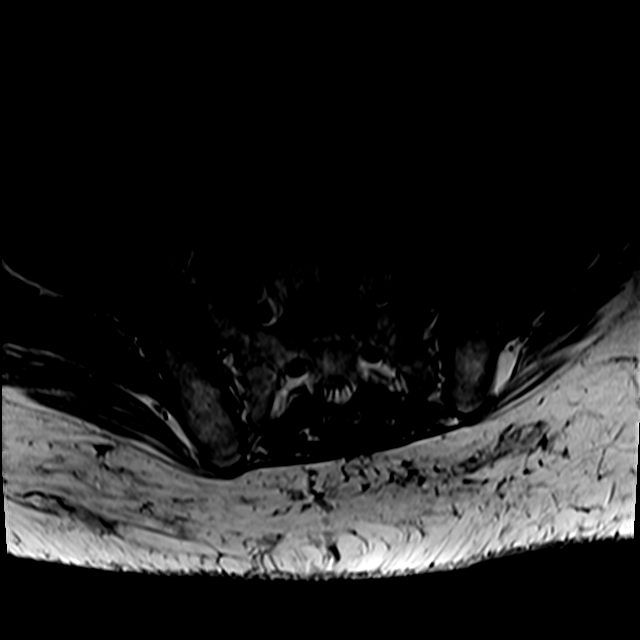
[im 4/30]
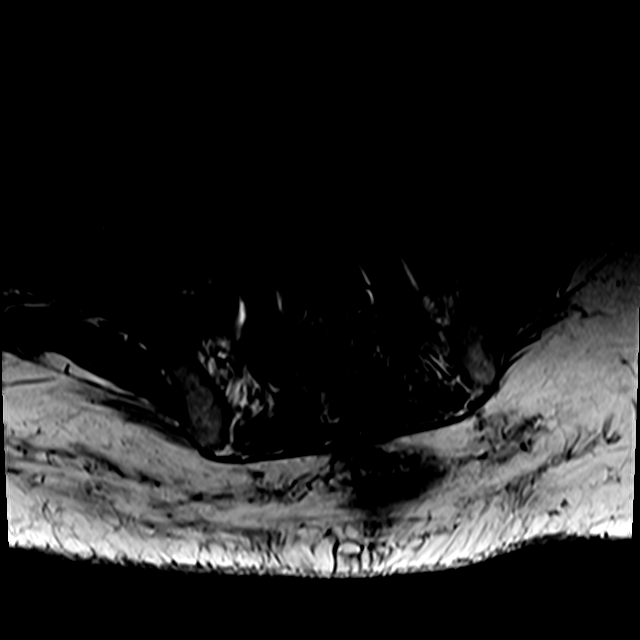
[im 8/30]
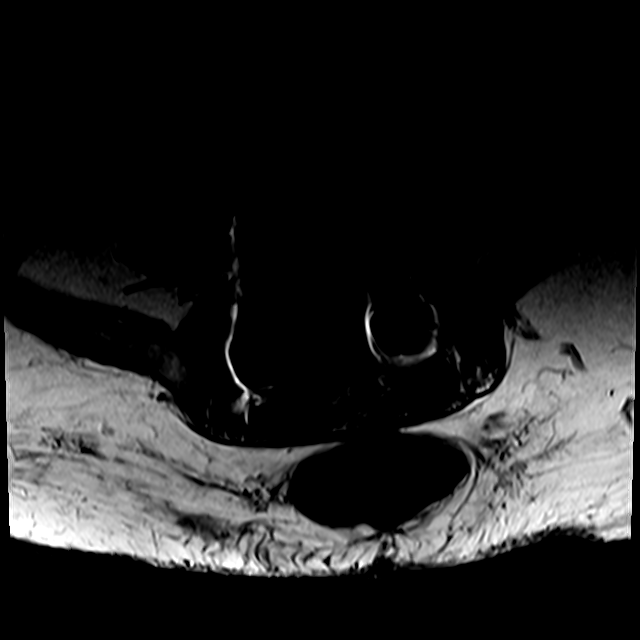
[im 15/30]
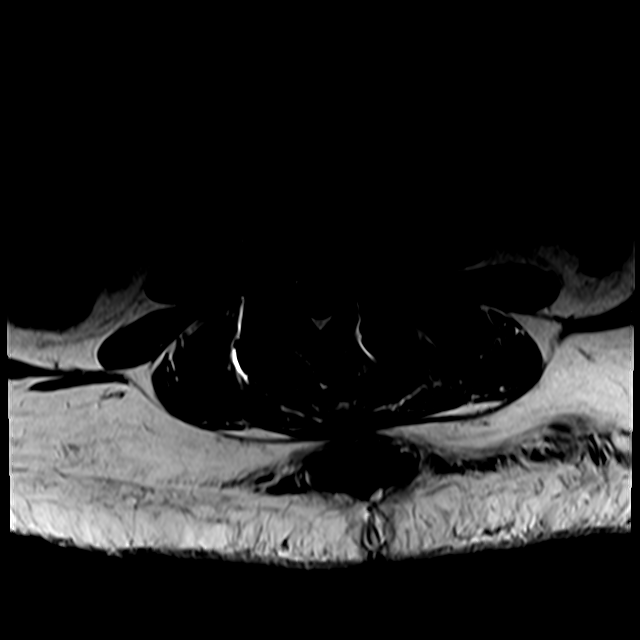
[im 26/30]
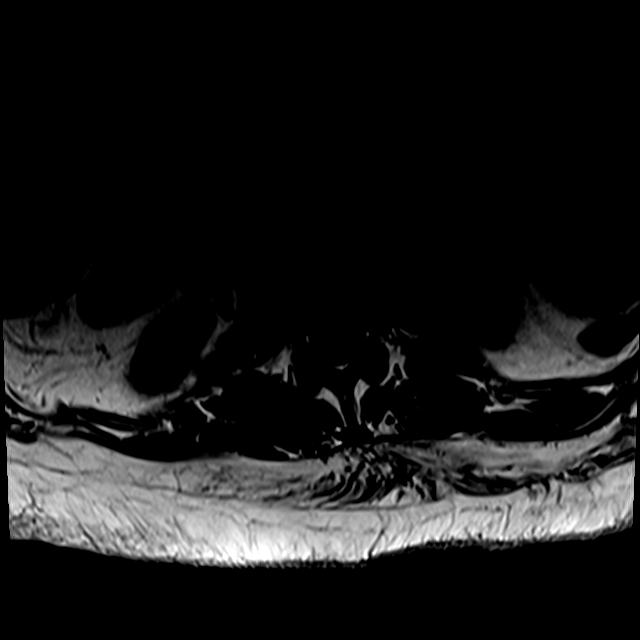

[25 of 48 positions shown; findings below may reference images not displayed]

FINDINGS: Segmentation: There are five lumbar type vertebral bodies. The last
full intervertebral disc space is labeled L5-S1. This correlates
with the prior MRI examination.

Alignment:  Normal

Vertebrae: Since the prior MRI there have been surgical changes with
pedicle screws, posterior rods and interbody fusion devices at L4-5
and L5-S1. Moderate associated artifact with the hardware. No
complicating features.

Conus medullaris and cauda equina: Conus extends to the L1 level.
Conus and cauda equina appear normal.

Paraspinal and other soft tissues: No significant paraspinal or
retroperitoneal findings.

There is a moderate-sized fluid collection in the laminectomy defect
measuring approximately 5.5 x 4.0 x 3.0 cm. No obvious connection
with the thecal sac is demonstrated.

There is a large rim enhancing fluid collection in the subcutaneous
fat extending from L3-S1. This measures 9.0 x 6.0 x 3.8 cm. It could
be a liquified hematoma but an abscess is certainly a consideration.
I do not see a definite/obvious connection between these 2 fluid
collections.

Disc levels:

L1-2: No significant findings.

L2-3: No significant findings.

L3-4: Mild facet disease. No disc protrusions, spinal or foraminal
stenosis.

L4-5: Surgical changes with wide decompressive laminectomy.
Posterior and interbody fusion hardware noted. No obvious spinal or
foraminal stenosis. Large postoperative fluid collection in the
laminectomy bed. CSF leak is certainly a possibility.

L5-S1: Wide decompressive laminectomy with posterior and interbody
fusion changes. Large fluid collection in the laminectomy bed and in
the subcutaneous tissues.
IMPRESSION: 1. Postoperative changes with posterior and interbody fusion at L4-5
and L5-S1. Wide decompressive laminectomies without findings for
spinal or foraminal stenosis.
2. The other intervertebral disc spaces are unremarkable. I do not
see a definite cause for the patient's left leg pain.
3. Two sizable postoperative fluid collections as detailed above. 5
cm collection in the laminectomy bed from the top of L4 to the top
of S1. It could be a benign postop fluid collection but could not
exclude abscess or CSF leak. I do not see a definite connection with
the larger, 9 cm, rim enhancing subcutaneous lesion. That lesion
could be an abscess. Aspiration may be helpful.

## 2020-03-08 ENCOUNTER — Other Ambulatory Visit: Payer: Self-pay

## 2020-03-08 ENCOUNTER — Emergency Department (HOSPITAL_COMMUNITY): Payer: Medicare Other

## 2020-03-08 ENCOUNTER — Emergency Department (HOSPITAL_COMMUNITY)
Admission: EM | Admit: 2020-03-08 | Discharge: 2020-03-08 | Disposition: A | Discharge status: Home / Self Care | Payer: Medicare Other | Attending: Emergency Medicine | Admitting: Emergency Medicine

## 2020-03-08 ENCOUNTER — Encounter (HOSPITAL_COMMUNITY): Payer: Self-pay

## 2020-03-08 ENCOUNTER — Emergency Department (HOSPITAL_BASED_OUTPATIENT_CLINIC_OR_DEPARTMENT_OTHER)
Admit: 2020-03-08 | Discharge: 2020-03-08 | Disposition: A | Discharge status: Home / Self Care | Payer: Medicare Other | Attending: Emergency Medicine | Admitting: Emergency Medicine

## 2020-03-08 DIAGNOSIS — M7989 Other specified soft tissue disorders: Secondary | ICD-10-CM | POA: Insufficient documentation

## 2020-03-08 DIAGNOSIS — M549 Dorsalgia, unspecified: Secondary | ICD-10-CM | POA: Insufficient documentation

## 2020-03-08 DIAGNOSIS — R0602 Shortness of breath: Secondary | ICD-10-CM | POA: Insufficient documentation

## 2020-03-08 DIAGNOSIS — M79609 Pain in unspecified limb: Secondary | ICD-10-CM | POA: Diagnosis not present

## 2020-03-08 DIAGNOSIS — R2242 Localized swelling, mass and lump, left lower limb: Secondary | ICD-10-CM | POA: Diagnosis present

## 2020-03-08 LAB — CBC WITH DIFFERENTIAL/PLATELET
Abs Immature Granulocytes: 0.01 10*3/uL (ref 0.00–0.07)
Basophils Absolute: 0 10*3/uL (ref 0.0–0.1)
Basophils Relative: 0 %
Eosinophils Absolute: 0 10*3/uL (ref 0.0–0.5)
Eosinophils Relative: 0 %
HCT: 32.1 % — ABNORMAL LOW (ref 36.0–46.0)
Hemoglobin: 10.4 g/dL — ABNORMAL LOW (ref 12.0–15.0)
Immature Granulocytes: 0 %
Lymphocytes Relative: 40 %
Lymphs Abs: 3 10*3/uL (ref 0.7–4.0)
MCH: 29.3 pg (ref 26.0–34.0)
MCHC: 32.4 g/dL (ref 30.0–36.0)
MCV: 90.4 fL (ref 80.0–100.0)
Monocytes Absolute: 0.6 10*3/uL (ref 0.1–1.0)
Monocytes Relative: 8 %
Neutro Abs: 4 10*3/uL (ref 1.7–7.7)
Neutrophils Relative %: 52 %
Platelets: 227 10*3/uL (ref 150–400)
RBC: 3.55 MIL/uL — ABNORMAL LOW (ref 3.87–5.11)
RDW: 15.2 % (ref 11.5–15.5)
WBC: 7.7 10*3/uL (ref 4.0–10.5)
nRBC: 0 % (ref 0.0–0.2)

## 2020-03-08 LAB — COMPREHENSIVE METABOLIC PANEL
ALT: 21 U/L (ref 0–44)
AST: 26 U/L (ref 15–41)
Albumin: 2.3 g/dL — ABNORMAL LOW (ref 3.5–5.0)
Alkaline Phosphatase: 94 U/L (ref 38–126)
Anion gap: 8 (ref 5–15)
BUN: 12 mg/dL (ref 6–20)
CO2: 27 mmol/L (ref 22–32)
Calcium: 8 mg/dL — ABNORMAL LOW (ref 8.9–10.3)
Chloride: 107 mmol/L (ref 98–111)
Creatinine, Ser: 0.71 mg/dL (ref 0.44–1.00)
GFR calc Af Amer: >60 mL/min (ref >60)
GFR calc non Af Amer: >60 mL/min (ref >60)
Glucose, Bld: 90 mg/dL (ref 70–99)
Potassium: 3.4 mmol/L — ABNORMAL LOW (ref 3.5–5.1)
Sodium: 142 mmol/L (ref 135–145)
Total Bilirubin: 1 mg/dL (ref 0.3–1.2)
Total Protein: 4.9 g/dL — ABNORMAL LOW (ref 6.5–8.1)

## 2020-03-08 LAB — BRAIN NATRIURETIC PEPTIDE: B Natriuretic Peptide: 95.9 pg/mL (ref 0.0–100.0)

## 2020-03-08 NOTE — Discharge Instructions (Addendum)
Your scan does not show any blood clots in your legs and your blood work was your normal.  Please follow up with your doctor.

## 2020-03-08 NOTE — Progress Notes (Signed)
Left lower extremity venous duplex completed. Refer to "CV Proc" under chart review to view preliminary results.  03/08/2020 7:16 PM Kelby Aline., MHA, RVT, RDCS, RDMS

## 2020-03-08 NOTE — ED Notes (Signed)
Patient ambulated without assistance with walker

## 2020-03-08 NOTE — ED Provider Notes (Signed)
[place] EMERGENCY DEPARTMENT Provider Note   CSN: [id number] Arrival date & time: 03/08/20  1421     History No chief complaint on file.   Jodi Bowman is a 59 y.o. female with past medical history of stroke, ACS, bowel obstruction, fibromyalgia, lower back pain, who presents today for evaluation of left leg and foot pain and swelling.  She reports that over the past 3 days she has worsening pain and swelling.  It started in the foot and has now spread to involve her lower leg.  She denies any fevers.  She denies any changes in her breathing since this started.  She states that she had a similar episode a few months ago however this is different and that it is more severe and started quicker.  She denies any new pain in her stomach or lower back, has chronic symptoms there that are unchanged.  She denies any new medications or missing doses on her medicines.  No trauma.    HPI     Past Medical History:  Diagnosis Date  . Arthritis   . Calculus of gallbladder without mention of cholecystitis or obstruction   . Dizziness   . Fibromyalgia   . GERD (gastroesophageal reflux disease)   . Goiter   . Hypercholesteremia   . Hypertension   . Lower back pain   . Migraine   . Nontoxic uninodular goiter    sees dr vollmer at [facility]  . Obesity   . PONV (postoperative nausea and vomiting)   . Sleep apnea    STOPBANG=5  . Stroke [facility]) 2000    Patient Active Problem List   Diagnosis Date Noted  . ACS (acute coronary syndrome) ([place]) 08/25/2019  . Chest pain 08/25/2019  . Postoperative seroma involving nervous system after nervous system procedure 04/05/2019  . Drainage from wound 04/02/2019  . Infective otitis externa of left ear   . Left otitis media   . AKI (acute kidney injury) ([place])   . Urinary retention   . Orthostasis   . Acute blood loss anemia   . Fibromyalgia   . Chronic pain syndrome   . Lumbar radiculopathy 03/08/2019  . Orthostatic  hypotension 03/06/2019  . Postoperative urinary retention 03/06/2019  . Lumbar foraminal stenosis 03/04/2019  . S/P exploratory laparotomy 03/06/2018  . Bowel obstruction ([place]) 03/06/2018  . Chest pain, rule out acute myocardial infarction 12/17/2017  . Hypokalemia 12/17/2017  . Acute lower UTI 12/17/2017  . Vertigo 04/30/2017  . Small vessel disease, cerebrovascular 04/30/2017  . Chronic low back pain 08/01/2015  . Right hip pain 08/01/2015  . Abnormality of gait 08/01/2015  . Left leg weakness   . Low back pain with radiation   . Syncope 07/30/2014  . Atypical chest pain 03/12/2014  . Lap chole [facility] April 2013 01/29/2012  . Gallstones 12/04/2011  . Thyroid nodule-non neoplastic goiter by needle aspiration 12/04/2011  . GLUCOSE INTOLERANCE 03/12/2010  . DYSLIPIDEMIA 03/12/2010  . Chronic migraine 03/12/2010  . Carotid stenosis 03/12/2010  . CEREBROVASCULAR ACCIDENT 03/12/2010  . LIPOMA 01/29/2010  . HEADACHE 01/29/2010  . ANKLE INJURY, RIGHT 04/12/2009  . PHARYNGITIS 03/21/2009  . Backache 03/01/2009  . ALLERGIC RHINITIS 03/17/2007  . LOW BACK PAIN 03/17/2007  . Essential hypertension 01/01/2007  . ANXIETY STATE NOS 09/11/2005    Past Surgical History:  Procedure Laterality Date  . ABDOMINAL HYSTERECTOMY    . BOWEL RESECTION N/A 03/06/2018   Procedure: SMALL BOWEL ANASTAMOSIS;  Surgeon: Clovis Riley,  MD;  Location: [place];  Service: General;  Laterality: N/A;  . CATARACT EXTRACTION W/ INTRAOCULAR LENS IMPLANT Bilateral   . CESAREAN SECTION  yrs ago   done x 2  . CHOLECYSTECTOMY  01/05/2012   Procedure: LAPAROSCOPIC CHOLECYSTECTOMY WITH INTRAOPERATIVE CHOLANGIOGRAM;  Surgeon: Pedro Earls, MD;  Location: [facility];  Service: General;  Laterality: N/A;  . COLONOSCOPY  10/08/2012   Procedure: COLONOSCOPY;  Surgeon: Beryle Beams, MD;  Location: [facility];  Service: Endoscopy;  Laterality: N/A;  . KNEE ARTHROSCOPY  one 1995 and 1 in 1997   both knees done  .  LAPAROSCOPY N/A 03/06/2018   Procedure: LAPAROSCOPY DIAGNOSTIC WITH LYSIS OF ADHESIONS;  Surgeon: Clovis Riley, MD;  Location: [place];  Service: General;  Laterality: N/A;  . LAPAROTOMY N/A 03/06/2018   Procedure: EXPLORATORY LAPAROTOMY, RESECTION OF DISTAL ROUX, CLOSURE OF INTERNAL HERNIA;  Surgeon: Clovis Riley, MD;  Location: [place];  Service: General;  Laterality: N/A;  . LEFT HEART CATH AND CORONARY ANGIOGRAPHY N/A 08/29/2019   Procedure: LEFT HEART CATH AND CORONARY ANGIOGRAPHY;  Surgeon: Charolette Forward, MD;  Location: [place] [facility];  Service: Cardiovascular;  Laterality: N/A;  . LUMBAR WOUND DEBRIDEMENT N/A 04/03/2019   Procedure: LUMBAR WOUND WASHOUT;  Surgeon: Judith Part, MD;  Location: [place];  Service: Neurosurgery;  Laterality: N/A;  . POSTERIOR LUMBAR FUSION  03/04/2019  . surgery for endometriosis  yrs ago  . thryoid biopsy  December 01, 2011    at [facility]     OB History   No obstetric history on file.     Family History  Problem Relation Age of Onset  . Diabetes Mother   . Hypertension Mother   . Transient ischemic attack Mother   . Seizures Mother   . Dementia Mother   . Other Father        MVA  . Cancer Brother        colon and lung  . Cancer Maternal Grandmother        colon    Social History   Tobacco Use  . Smoking status: Never Smoker  . Smokeless tobacco: Never Used  Vaping Use  . Vaping Use: Never used  Substance Use Topics  . Alcohol use: Not Currently    Alcohol/week: 0.0 standard drinks  . Drug use: No    Home Medications Prior to Admission medications   Medication Sig Start Date End Date Taking? Authorizing Provider  albuterol (PROVENTIL HFA) 108 (90 Base) MCG/ACT inhaler Inhale 1-2 puffs into the lungs every 6 (six) hours as needed for wheezing or shortness of breath.   Yes [provider]  amLODipine (NORVASC) 5 MG tablet Take 5 mg by mouth daily. 01/14/20  Yes [provider]  aspirin EC 81 MG tablet Take 81 mg  by mouth daily.   Yes [provider]  desoximetasone (TOPICORT) 0.25 % cream Apply 1 application topically 2 (two) times daily as needed (rash).  01/26/20  Yes [provider]  DULoxetine (CYMBALTA) 60 MG capsule Take 60 mg by mouth daily. 08/11/19  Yes [provider]  escitalopram (LEXAPRO) 5 MG tablet Take 5 mg by mouth daily as needed (depression).  03/01/20  Yes [provider]  ferrous sulfate 325 (65 FE) MG EC tablet Take 325 mg by mouth daily. 07/26/19  Yes [provider]  fluticasone (FLONASE) 50 MCG/ACT nasal spray Place 1 spray into both nostrils daily as needed for allergies.  11/08/17  Yes [provider]  folic acid (FOLVITE) 1 MG tablet Take 1 mg by mouth daily. 10/14/17  Yes [provider]  HYDROcodone-acetaminophen (NORCO) 10-325 MG tablet Take 1 tablet by mouth 5 (five) times daily as needed for moderate pain or severe pain.  02/16/20  Yes [provider]  isosorbide mononitrate (IMDUR) 30 MG 24 hr tablet Take 1 tablet (30 mg total) by mouth daily. 11/18/19  Yes Corena Herter, PA-C  loratadine (CLARITIN) 10 MG tablet Take 10 mg by mouth daily.    Yes [provider]  losartan-hydrochlorothiazide (HYZAAR) 100-12.5 MG tablet Take 1 tablet by mouth daily. 01/13/20  Yes [provider]  Magnesium Oxide 400 MG CAPS Take 1 capsule (400 mg total) by mouth daily. 09/28/19  Yes Carlisle Cater, PA-C  methocarbamol (ROBAXIN) 500 MG tablet Take 1 tablet (500 mg total) by mouth every 6 (six) hours as needed for muscle spasms. 03/15/19  Yes Angiulli, Lavon Paganini, PA-C  metoprolol succinate (TOPROL-XL) 100 MG 24 hr tablet Take 100 mg by mouth at bedtime.  07/10/19  Yes [provider]  morphine (MS CONTIN) 15 MG 12 hr tablet Take 15 mg by mouth every 12 (twelve) hours.  02/16/20  Yes [provider]  nitroGLYCERIN (NITROSTAT) 0.4 MG SL tablet Place 1 tablet (0.4 mg total) under the tongue every 5  (five) minutes x 3 doses as needed for chest pain. Patient taking differently: Place 0.4 mg under the tongue every 5 (five) minutes as needed for chest pain (MAXIMUM OF 3 DOSES).  03/13/14  Yes Charolette Forward, MD  pantoprazole (PROTONIX) 40 MG tablet Take 1 tablet (40 mg total) by mouth 2 (two) times daily. 03/15/19  Yes Angiulli, Lavon Paganini, PA-C  polyethylene glycol (MIRALAX / GLYCOLAX) 17 g packet Take 17 g by mouth daily as needed for mild constipation. 03/08/19  Yes Viona Gilmore D, NP  potassium chloride SA (KLOR-CON) 20 MEQ tablet Take 1 tablet (20 mEq total) by mouth 2 (two) times daily. 09/28/19  Yes Carlisle Cater, PA-C  pregabalin (LYRICA) 75 MG capsule Take 1 capsule (75 mg total) by mouth 3 (three) times daily. 03/15/19  Yes Angiulli, Lavon Paganini, PA-C  triamcinolone cream (KENALOG) 0.1 % Apply 1 application topically 2 (two) times daily as needed (rash).  01/11/20  Yes [provider]  Vitamin D, Ergocalciferol, (DRISDOL) 1.25 MG (50000 UT) CAPS capsule Take 1 capsule (50,000 Units total) by mouth every Monday, Wednesday, and Friday. 03/16/19  Yes Angiulli, Lavon Paganini, PA-C  amLODipine (NORVASC) 2.5 MG tablet Take 1 tablet (2.5 mg total) by mouth daily. Patient not taking: Reported on 03/08/2020 11/18/19   Corena Herter, PA-C  metoprolol (LOPRESSOR) 50 MG tablet Take 50 mg by mouth 2 (two) times daily.    12/04/11  [provider]  simvastatin (ZOCOR) 20 MG tablet Take 20 mg by mouth at bedtime.    12/04/11  [provider]    Allergies    Shrimp [shellfish allergy] and Naproxen  Review of Systems   Review of Systems  Constitutional: Negative for chills, fatigue and fever.  Respiratory: Positive for shortness of breath (Unchanged from baseline).   Cardiovascular: Positive for leg swelling.  Gastrointestinal: Positive for abdominal pain (Chronic, unchanged).  Musculoskeletal: Positive for back pain (Chronic, unchanged).  Neurological: Negative for weakness.  All  other systems reviewed and are negative.   Physical Exam Updated Vital Signs BP (!) 170/93 (BP Location: Right Arm)   Pulse 89   Temp 98.2  F (36.8 C) (Oral)   Resp 16   SpO2 100%   Physical Exam Vitals and nursing note reviewed.  Constitutional:      General: She is not in acute distress.    Appearance: She is well-developed. She is not diaphoretic.  HENT:     Head: Normocephalic and atraumatic.  Eyes:     General: No scleral icterus.       Right eye: No discharge.        Left eye: No discharge.     Conjunctiva/sclera: Conjunctivae normal.  Cardiovascular:     Rate and Rhythm: Normal rate and regular rhythm.     Pulses: Normal pulses.     Comments: 2+ left dp pulse, unable to tolerate attempt at palpation PT pulse secondary to pain. Left foot is warm capillary refill under 2 seconds.  Pulmonary:     Effort: Pulmonary effort is normal. No respiratory distress.     Breath sounds: No stridor.  Abdominal:     General: There is no distension.  Musculoskeletal:        General: No deformity.     Cervical back: Normal range of motion.     Comments: Mild edema of left lower leg with posterior leg pain.  Patient is able to move her left ankle however reports pain with doing so.  No pain in knee or hip on left side.    Skin:    General: Skin is warm and dry.  Neurological:     Mental Status: She is alert.     Motor: No abnormal muscle tone.     Comments: Awake and alert.  Subjective decreased sensation to light touch to left lower leg.   Psychiatric:        Mood and Affect: Mood normal.        Behavior: Behavior normal.     ED Results / Procedures / Treatments   Labs (all labs ordered are listed, but only abnormal results are displayed) Labs Reviewed  COMPREHENSIVE METABOLIC PANEL - Abnormal; Notable for the following components:      Result Value   Potassium 3.4 (*)    Calcium 8.0 (*)    Total Protein 4.9 (*)    Albumin 2.3 (*)    All other components within normal  limits  CBC WITH DIFFERENTIAL/PLATELET - Abnormal; Notable for the following components:   RBC 3.55 (*)    Hemoglobin 10.4 (*)    HCT 32.1 (*)    All other components within normal limits  BRAIN NATRIURETIC PEPTIDE    EKG None  Radiology DG Ankle Complete Left  Result Date: 03/08/2020 CLINICAL DATA:  Left ankle pain and swelling for 3 days EXAM: LEFT FOOT - COMPLETE 3+ VIEW; LEFT ANKLE COMPLETE - 3+ VIEW COMPARISON:  02/27/2014 FINDINGS: Left ankle: Frontal, oblique, and lateral views of the left ankle demonstrate no fracture, subluxation, or dislocation. Mild tibiotalar osteoarthritis with joint space narrowing and osteophyte formation. There is mild diffuse soft tissue edema. Left foot: Frontal, oblique, and lateral views demonstrate no fracture, subluxation, or dislocation. Joint spaces are well preserved. Mild diffuse soft tissue edema. IMPRESSION: 1. Diffuse soft tissue edema of the left foot and ankle. 2. Left ankle osteoarthritis. 3. No acute bony abnormality. Electronically Signed   By: Randa Ngo M.D.   On: 03/08/2020 19:58   DG Foot Complete Left  Result Date: 03/08/2020 CLINICAL DATA:  Left ankle pain and swelling for 3 days EXAM: LEFT FOOT - COMPLETE 3+ VIEW; LEFT ANKLE  COMPLETE - 3+ VIEW COMPARISON:  02/27/2014 FINDINGS: Left ankle: Frontal, oblique, and lateral views of the left ankle demonstrate no fracture, subluxation, or dislocation. Mild tibiotalar osteoarthritis with joint space narrowing and osteophyte formation. There is mild diffuse soft tissue edema. Left foot: Frontal, oblique, and lateral views demonstrate no fracture, subluxation, or dislocation. Joint spaces are well preserved. Mild diffuse soft tissue edema. IMPRESSION: 1. Diffuse soft tissue edema of the left foot and ankle. 2. Left ankle osteoarthritis. 3. No acute bony abnormality. Electronically Signed   By: Randa Ngo M.D.   On: 03/08/2020 19:58   VAS [place] LOWER EXTREMITY VENOUS (DVT) ([facility] and [facility]  7a-7p)  Result Date: 03/08/2020  Lower Venous DVTStudy Indications: Pain.  Limitations: Patient position, pain. Comparison Study: 09/28/2019- negative lower extremity venous duplex Performing Technologist: Maudry Mayhew MHA, RDMS, RVT, RDCS  Examination Guidelines: A complete evaluation includes B-mode imaging, spectral Doppler, color Doppler, and power Doppler as needed of all accessible portions of each vessel. Bilateral testing is considered an integral part of a complete examination. Limited examinations for reoccurring indications may be performed as noted. The reflux portion of the exam is performed with the patient in reverse Trendelenburg.  Right Technical Findings: Not visualized segments include CFV.  +---------+---------------+---------+-----------+----------+--------------+ LEFT     CompressibilityPhasicitySpontaneityPropertiesThrombus Aging +---------+---------------+---------+-----------+----------+--------------+ CFV      Full           Yes      Yes                                 +---------+---------------+---------+-----------+----------+--------------+ SFJ      Full                                                        +---------+---------------+---------+-----------+----------+--------------+ FV Prox  Full                                                        +---------+---------------+---------+-----------+----------+--------------+ FV Mid   Full                                                        +---------+---------------+---------+-----------+----------+--------------+ FV DistalFull                                                        +---------+---------------+---------+-----------+----------+--------------+ PFV      Full                                                        +---------+---------------+---------+-----------+----------+--------------+ POP      Full  Yes      Yes                                  +---------+---------------+---------+-----------+----------+--------------+ PTV      Full                                                        +---------+---------------+---------+-----------+----------+--------------+ PERO     Full                                                        +---------+---------------+---------+-----------+----------+--------------+     Summary: LEFT: - There is no evidence of deep vein thrombosis in the lower extremity.  - No cystic structure found in the popliteal fossa.  *See table(s) above for measurements and observations.    Preliminary     Procedures Procedures (including critical care time)  Medications Ordered in ED Medications - No data to display  ED Course  I have reviewed the triage vital signs and the nursing notes.  Pertinent labs & imaging results that were available during my care of the patient were reviewed by me and considered in my medical decision making (see chart for details).    MDM Rules/Calculators/A&P                         Patient is a 59 year old woman who presents today for evaluation of left ankle pain and swelling for 3 days.  She had similar a few months ago.  On my exam she does have edema of the left leg.  DVT study was performed with no evidence of DVT.  X-rays show soft tissue edema without fracture or other acute osseous abnormality.  Labs are obtained and reviewed, CBC CMP and CMP are consistent with her baseline.  She has normal renal function.  BNP is not significantly elevated.  She is able to ambulate with a walker without significant, this is consistent with her baseline.  Do not suspect septic arthritis but suspect she would be unable to walk due to pain, additionally she is afebrile, not tachycardic, no leukocytosis.  Gout considered, however given normal renal function and ability to walk less likely.    RICE, conservative care recommended.  PCP follow-up.  Return precautions were discussed with patient  who states their understanding.  At the time of discharge patient denied any unaddressed complaints or concerns.  Patient is agreeable for discharge home.  Note: Portions of this report may have been transcribed using voice recognition software. Every effort was made to ensure accuracy; however, inadvertent computerized transcription errors may be present  Final Clinical Impression(s) / ED Diagnoses Final diagnoses:  Left leg swelling    Rx / DC Orders ED Discharge Orders    None       Ollen Gross 03/08/20 [id number]    Lajean Saver, MD 03/09/20 [phone]

## 2020-03-08 NOTE — ED Triage Notes (Signed)
Patient complains of recurrent left ankle swelling x 3 days, denies trauma. Has had same in past that took weeks to resolve

## 2020-03-28 IMAGING — MR MRI LUMBAR SPINE WITHOUT AND WITH CONTRAST
7 series · 36 of 48 positions shown · IV contrast (Gadavist)
Comparison: Prior MRI from 04/02/2019.

CLINICAL DATA: Initial evaluation for increased lower back pain and
drainage from surgical wound. History of recent surgery on
03/04/2019, followed by debridement on 04/03/2019.

EXAM:
MRI LUMBAR SPINE WITHOUT AND WITH CONTRAST
TECHNIQUE: Multiplanar and multiecho pulse sequences of the lumbar spine were
obtained without and with intravenous contrast.
CONTRAST:  7.5 cc of Gadavist.

[Series 5: t2_tse_warp_sag · sagittal · 4.0mm · 0.81mm/px · 5 of 15 slices shown]
[im 1/15]
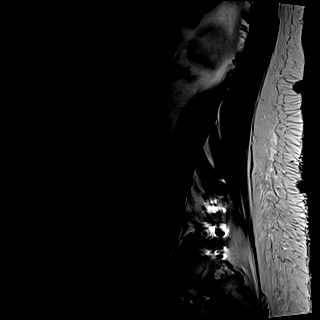
[im 4/15]
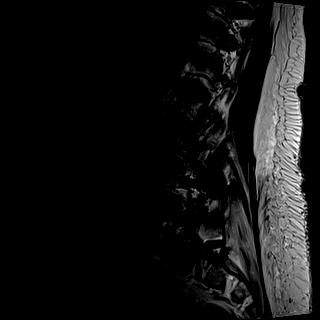
[im 8/15]
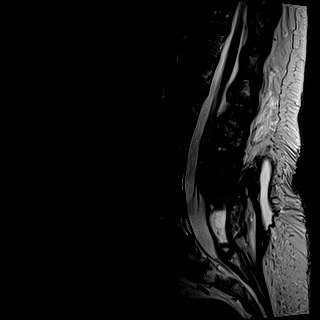
[im 11/15]
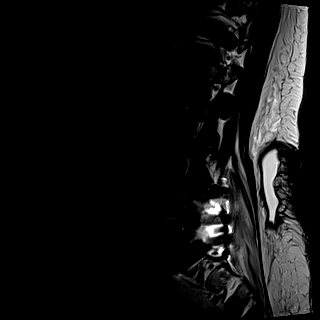
[im 15/15]
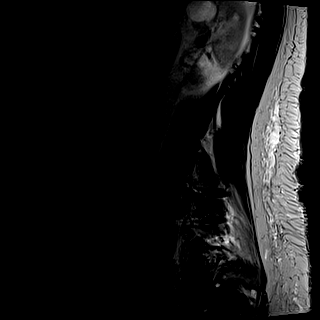

[Series 6: t2_tse_stir_warp_sag · sagittal · 4.0mm · 1.02mm/px · 5 of 15 slices shown]
[im 1/15]
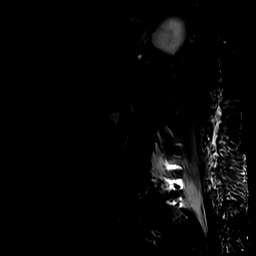
[im 4/15]
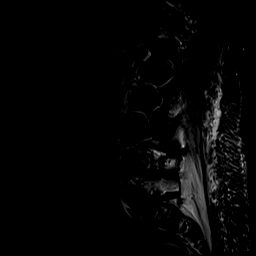
[im 8/15]
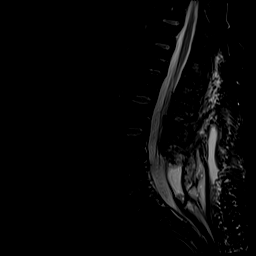
[im 11/15]
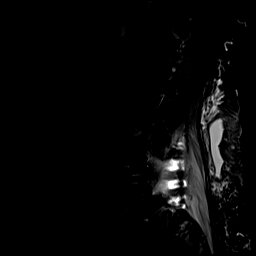
[im 15/15]
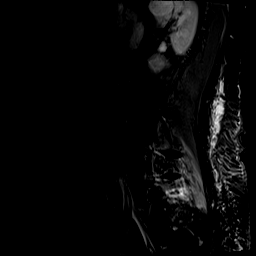

[Series 7: t1_tse_warp_sag · sagittal · 4.0mm · 0.81mm/px · 4 of 15 slices shown]
[im 1/15]
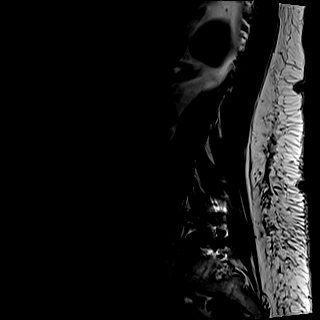
[im 5/15]
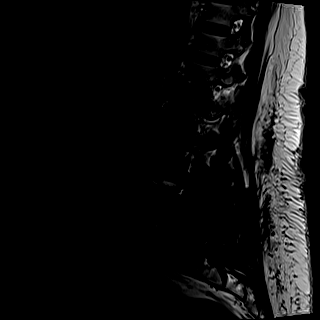
[im 10/15]
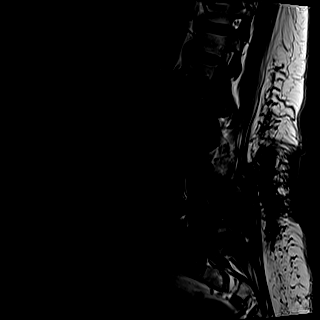
[im 15/15]
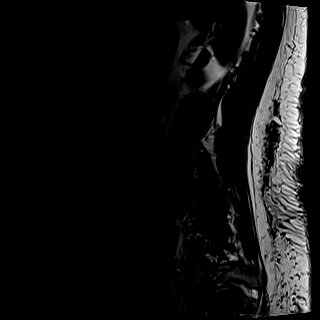

[Series 8: t2_tse_warp_tra · axial · 4.0mm · 0.78mm/px · z∈[-142,+66]mm · 8 of 47 slices shown]
[im 1/47]
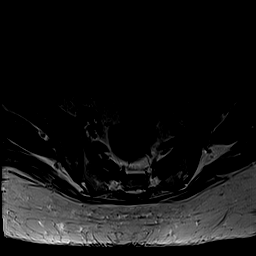
[im 8/47]
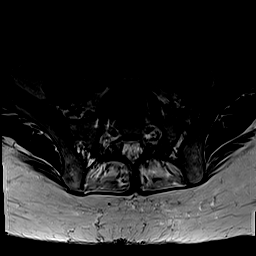
[im 16/47]
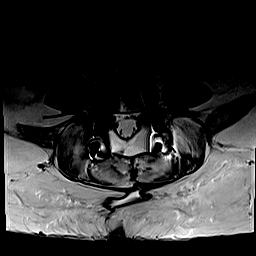
[im 20/47]
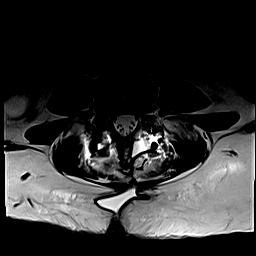
[im 27/47]
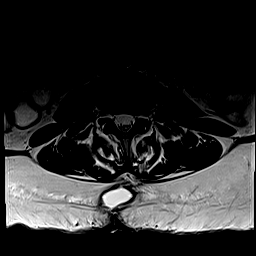
[im 31/47]
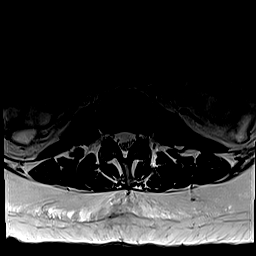
[im 39/47]
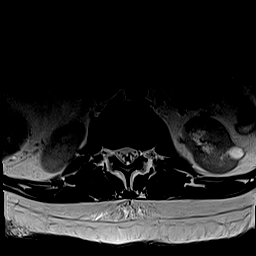
[im 47/47]
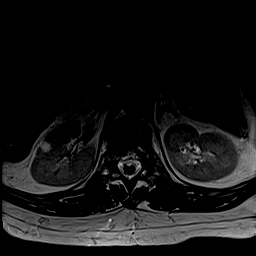

[Series 9: t1_tse_warp_sag fs post · sagittal · 4.0mm · 0.81mm/px · 4 of 15 slices shown]
[im 1/15]
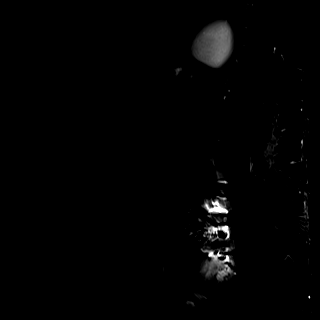
[im 5/15]
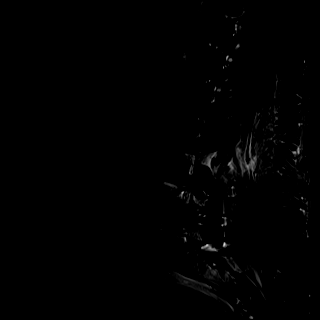
[im 10/15]
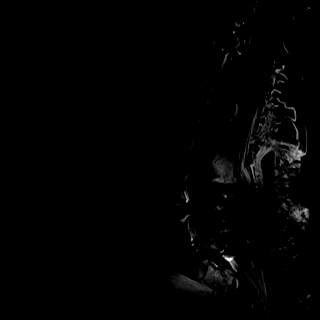
[im 15/15]
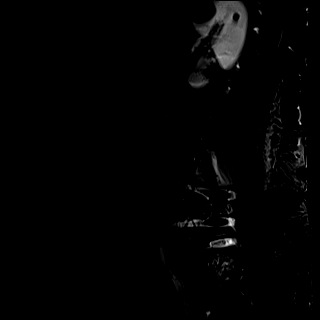

[Series 10: t1_tse_warp_tra post · axial · 4.0mm · 0.39mm/px · z∈[-142,-5]mm · 6 of 47 slices shown]
[im 1/47]
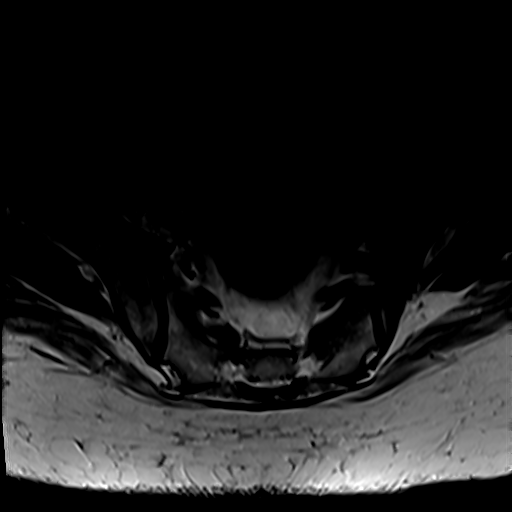
[im 8/47]
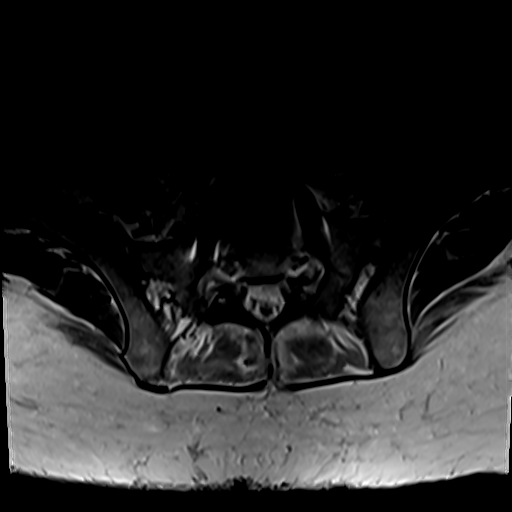
[im 16/47]
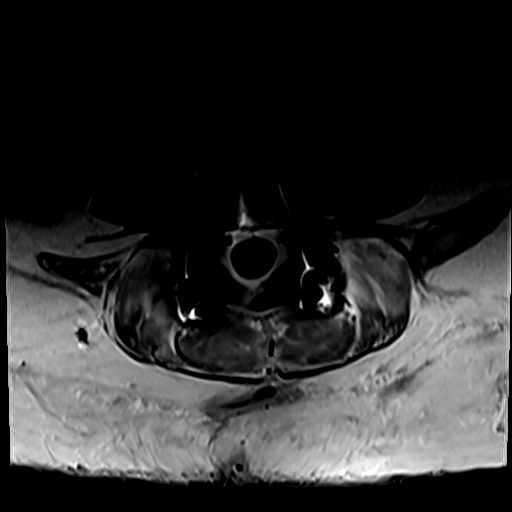
[im 20/47]
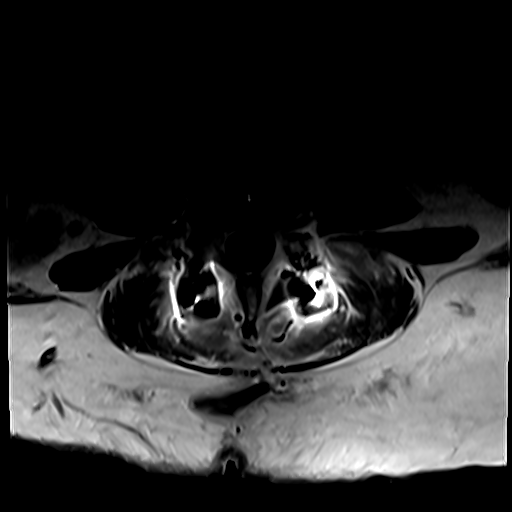
[im 27/47]
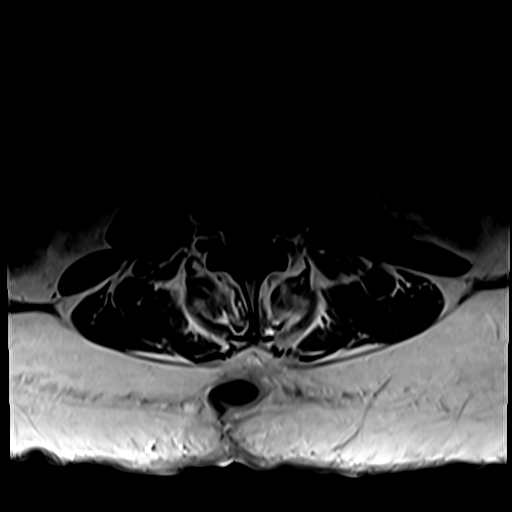
[im 31/47]
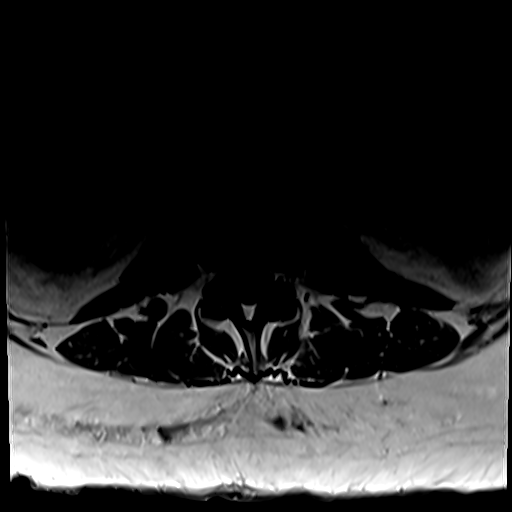

[Series 11: T1 post-contrast · sagittal · 4.0mm · 0.88mm/px · 4 of 15 slices shown]
[im 1/15]
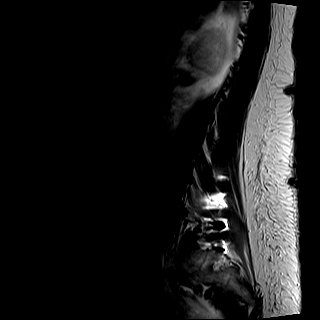
[im 5/15]
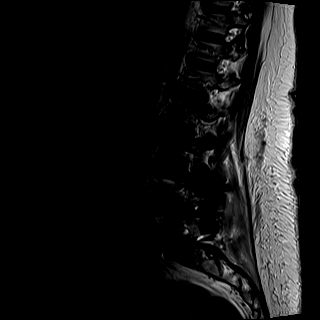
[im 10/15]
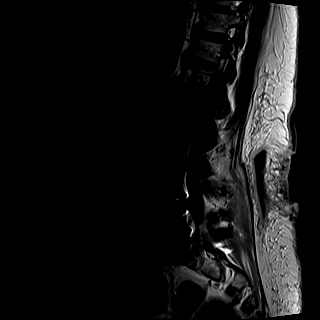
[im 15/15]
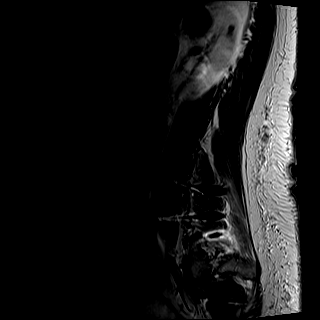

[36 of 48 positions shown; findings below may reference images not displayed]

FINDINGS: Segmentation: Examination mildly limited due to lack of axial
precontrast T1 weighted sequence, not performed by technical
mistake.

Normal segmentation lowest well-formed disc labeled the L5-S1 level.

Alignment: Vertebral bodies normally aligned with preservation of
the normal lumbar lordosis. No listhesis or interval malalignment.

Vertebrae: Susceptibility artifact from prior PLIF at L4-5 and
L5-S1. Vertebral body height maintained without evidence for acute
or interval fracture. Underlying bone marrow signal intensity within
normal limits. No discrete or worrisome osseous lesions. No
unexpected abnormal marrow edema or enhancement. No findings to
suggest osteomyelitis discitis.

Conus medullaris and cauda equina: Conus extends to the L1-2 level.
Conus and cauda equina appear normal. No evidence for arachnoiditis.

Paraspinal and other soft tissues: Postoperative changes from recent
PLIF and interval debridement seen within the lower lumbar spine. T2
hyperintense collection at the laminectomy site measures
approximately 2.2 x 3.6 x 4.2 cm (AP by transverse by craniocaudad).
Fairly smooth surrounding postcontrast enhancement without
significant inflammatory change, most likely a postoperative seroma,
similar to previous. CSF leak felt to be unlikely given the relative
stability of this collection.

Additional fluid collection along the lower midline incision at the
subcutaneous fat is decreased in size from previous, likely due to
interval debridement. Collection now measures approximately 3.2 x
1.2 x 6.5 cm (series 8, image 29). Collection appears to communicate
with the overlying skin, and likely reflects the site of drainage.
This collection does not definitely extend deep to the underlying
fascia, and is not definitely communicate with the previously
mention collection at the laminectomy site. Collection also favored
to reflect a postoperative seroma.

Paraspinous soft tissues demonstrate no other acute finding. Few
scattered T2 hyperintense simple renal cysts noted bilaterally.
Visualized visceral structures otherwise unremarkable.

Disc levels:

L1-2:  Minimal annular disc bulge.  No stenosis or impingement.

L2-3:  Unremarkable.

L3-4: Negative interspace. Minimal bilateral facet degeneration. No
canal or foraminal stenosis.

L4-5: Status post PLIF with wide posterior decompression. No
residual or recurrent canal or foraminal stenosis.

L5-S1: Status post PLIF with wide posterior decompression. No
residual canal or foraminal stenosis.
IMPRESSION: 1. Postoperative changes from recent PLIF at L4-5 and L5-S1 without
residual or recurrent stenosis.
2. Residual 2.2 x 3.6 x 4.2 cm collection at the laminectomy site,
relatively stable from previous, and felt to be most consistent with
a benign postoperative seroma. CSF leak felt to be unlikely given
the stability of this finding.
3. Additional 3.2 x 1.2 x 6.5 cm collection within the subcutaneous
soft tissues along the lower midline incision, decreased in size
from previous, and favored to reflect a residual and/or recurrent
postoperative seroma status post recent debridement. Superimposed
infection felt to be unlikely, although fluid sampling suggested as
clinically warranted, as this appears to drain to the overlying
skin.
4. No other new or significant findings within the lumbar spine.

## 2020-04-14 IMAGING — CR LUMBAR SPINE - COMPLETE 4+ VIEW
5 series · 5 of 5 positions shown · non-contrast
Comparison: None.

CLINICAL DATA: Pt states she fell out of bed this morning onto her
back. She complains of pain in the center of her lower back which
radiates down both legs. The pain is constant and goes from sharp
pain in her back to tingling as it moves down.*comment was
truncated*low back pain SP fall HX of repair

EXAM:
LUMBAR SPINE - COMPLETE 4+ VIEW

[l-spine ap]
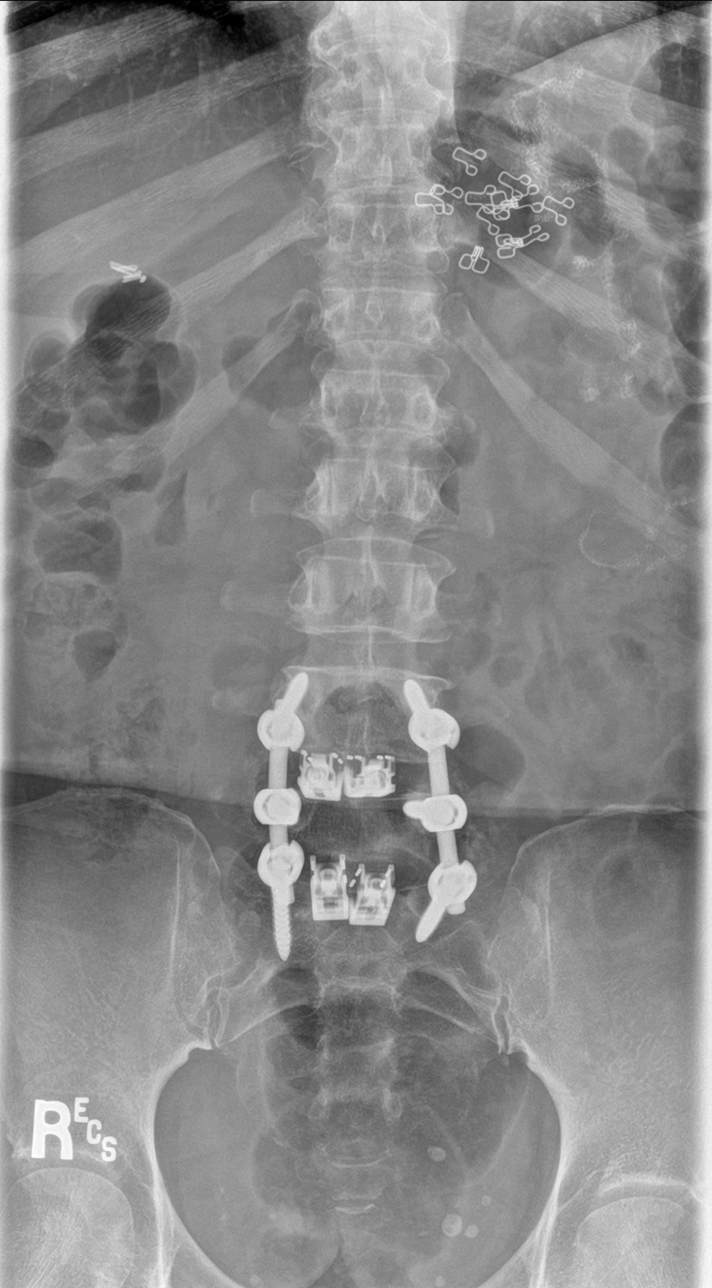

[l-spine obl (1 of 2)]
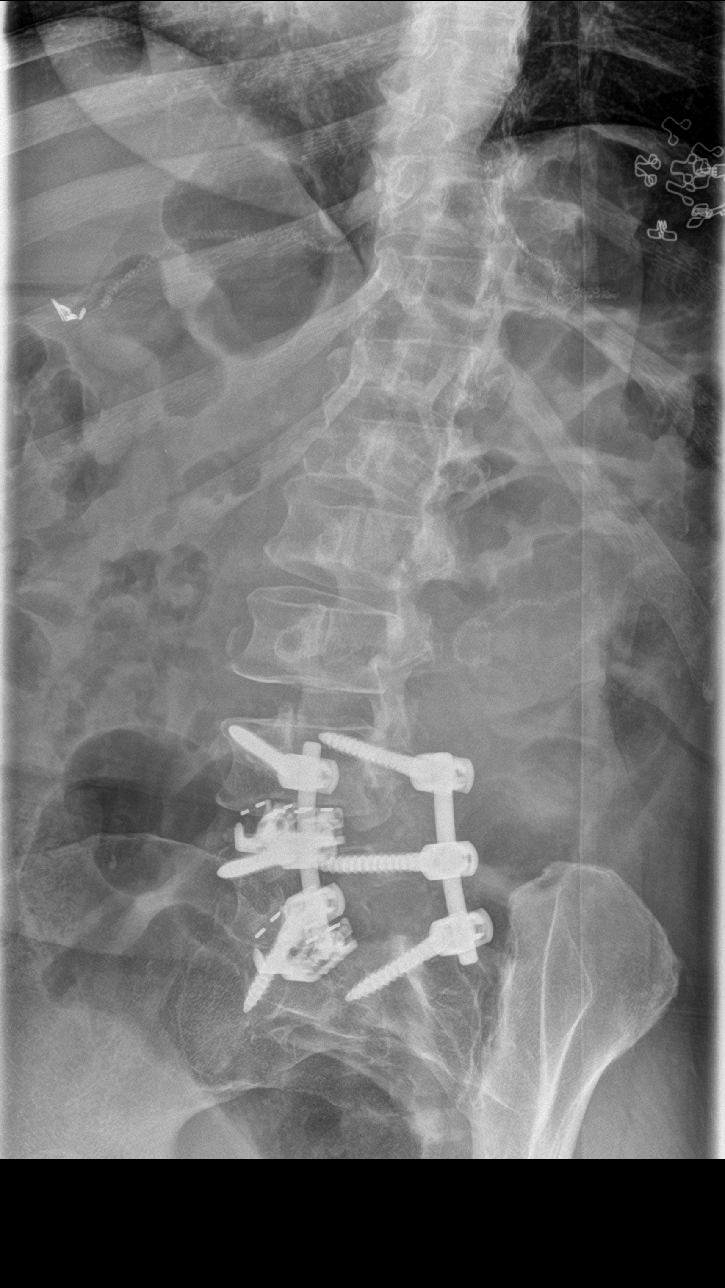

[l-spine obl (2 of 2)]
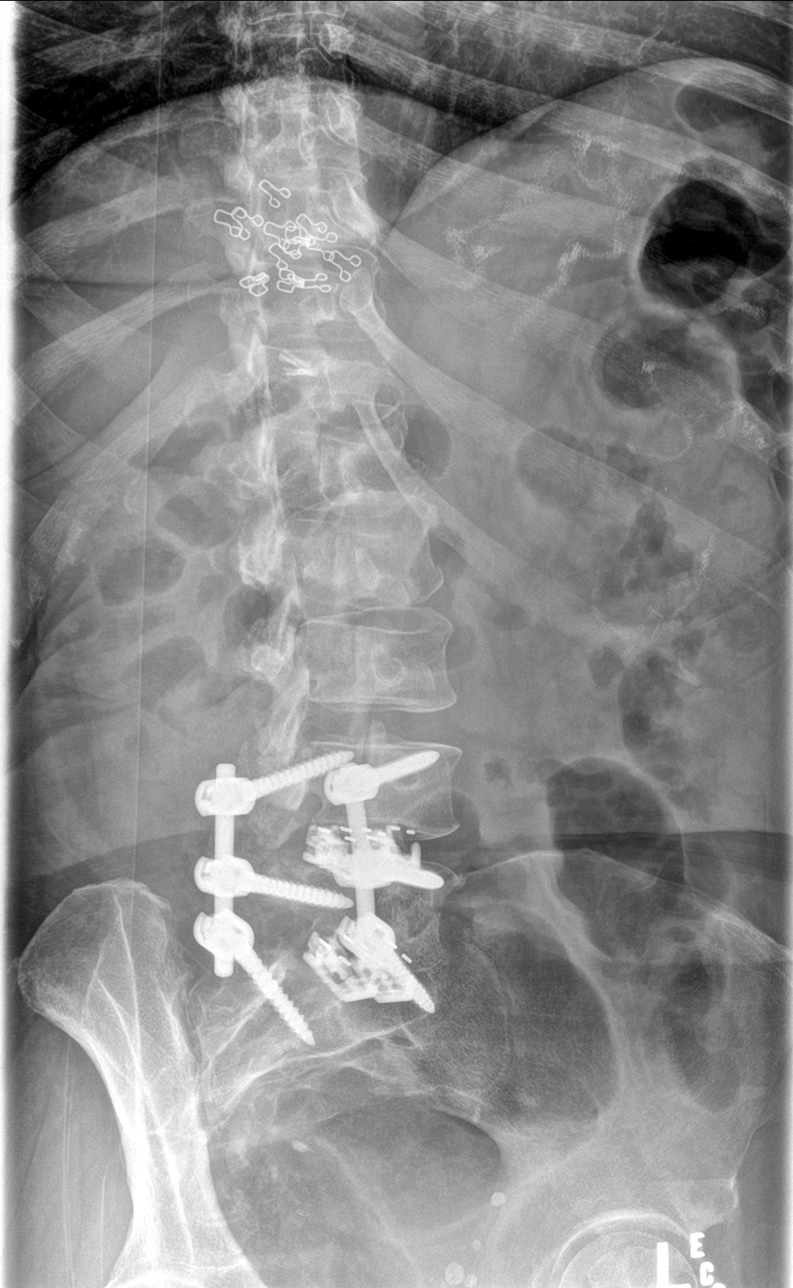

[l-spine lat]
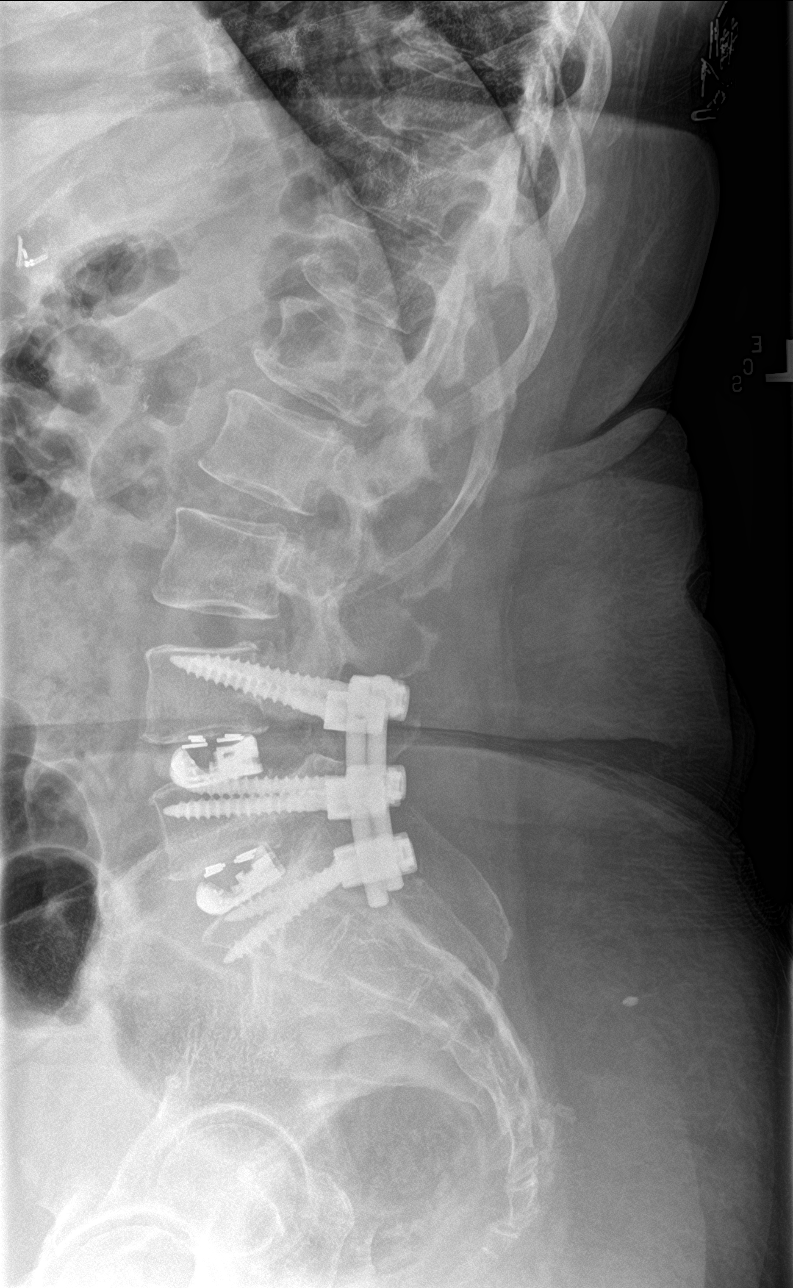

[l-spine spot]
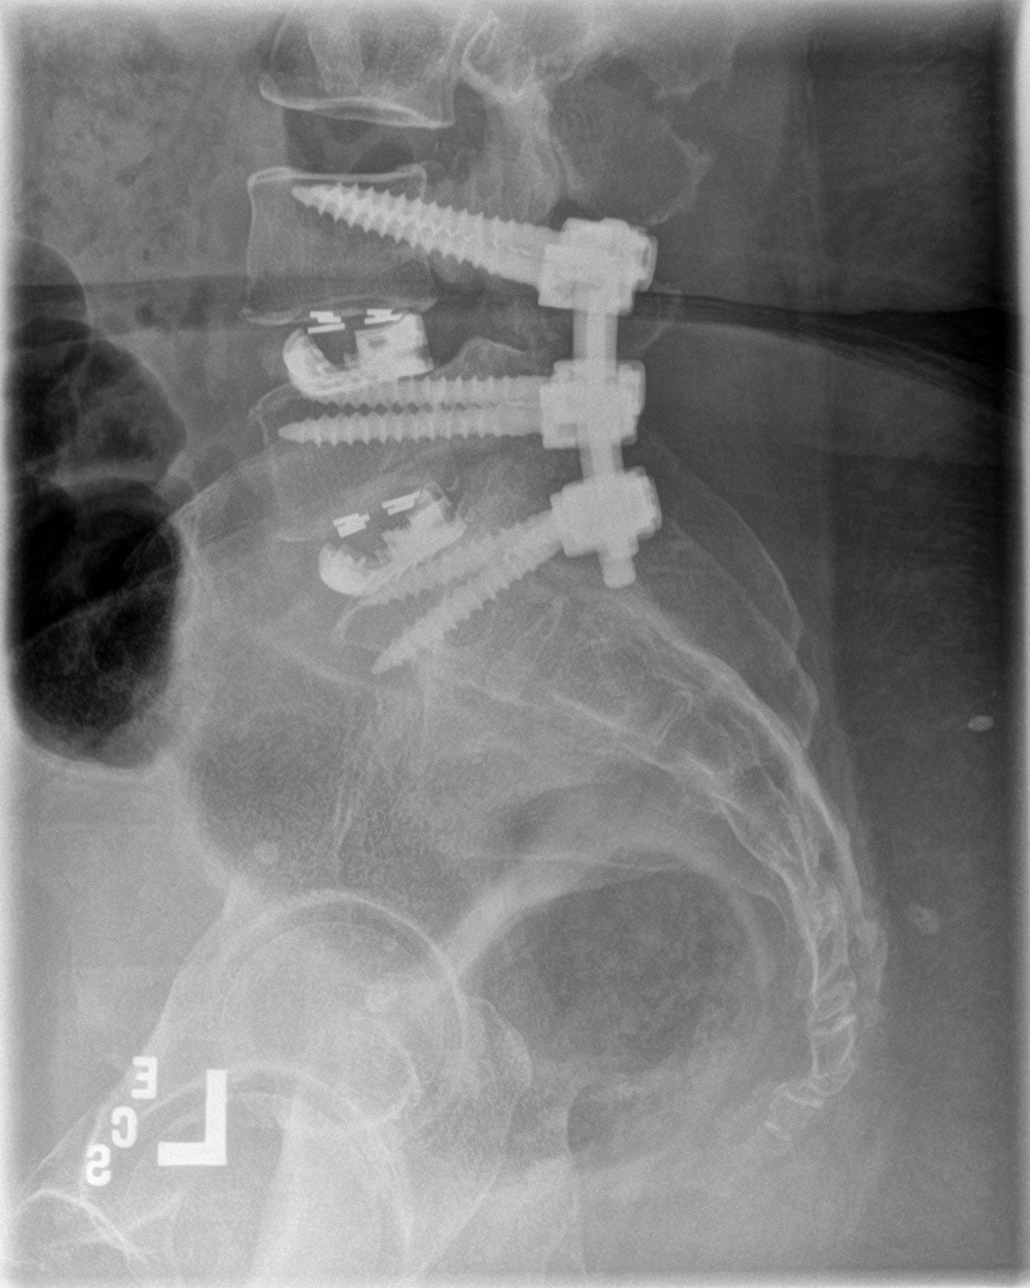

[5 of 5 positions shown; findings below may reference images not displayed]

FINDINGS: Posterior lumbar fusion from L 4 to S1 with posterior fusion rods
and pedicle screws. No acute loss vertebral body height or disc
height. No subluxation.
IMPRESSION: 1. No acute findings lumbar spine.
2. Posterior lumbar fusion without failure.

## 2020-05-03 ENCOUNTER — Other Ambulatory Visit: Payer: Self-pay

## 2020-05-03 ENCOUNTER — Emergency Department (HOSPITAL_COMMUNITY)
Admission: EM | Admit: 2020-05-03 | Discharge: 2020-05-03 | Disposition: A | Discharge status: Home / Self Care | Payer: Medicare Other | Attending: Emergency Medicine | Admitting: Emergency Medicine

## 2020-05-03 ENCOUNTER — Emergency Department (HOSPITAL_COMMUNITY): Payer: Medicare Other

## 2020-05-03 ENCOUNTER — Encounter (HOSPITAL_COMMUNITY): Payer: Self-pay

## 2020-05-03 DIAGNOSIS — S14109A Unspecified injury at unspecified level of cervical spinal cord, initial encounter: Secondary | ICD-10-CM | POA: Diagnosis present

## 2020-05-03 DIAGNOSIS — E876 Hypokalemia: Secondary | ICD-10-CM | POA: Insufficient documentation

## 2020-05-03 DIAGNOSIS — S161XXA Strain of muscle, fascia and tendon at neck level, initial encounter: Secondary | ICD-10-CM | POA: Insufficient documentation

## 2020-05-03 DIAGNOSIS — I2510 Atherosclerotic heart disease of native coronary artery without angina pectoris: Secondary | ICD-10-CM | POA: Insufficient documentation

## 2020-05-03 DIAGNOSIS — Y9389 Activity, other specified: Secondary | ICD-10-CM | POA: Diagnosis not present

## 2020-05-03 DIAGNOSIS — I10 Essential (primary) hypertension: Secondary | ICD-10-CM | POA: Insufficient documentation

## 2020-05-03 DIAGNOSIS — Z7982 Long term (current) use of aspirin: Secondary | ICD-10-CM | POA: Insufficient documentation

## 2020-05-03 DIAGNOSIS — R519 Headache, unspecified: Secondary | ICD-10-CM | POA: Diagnosis not present

## 2020-05-03 DIAGNOSIS — Y9289 Other specified places as the place of occurrence of the external cause: Secondary | ICD-10-CM | POA: Insufficient documentation

## 2020-05-03 DIAGNOSIS — M542 Cervicalgia: Secondary | ICD-10-CM | POA: Diagnosis not present

## 2020-05-03 DIAGNOSIS — Y998 Other external cause status: Secondary | ICD-10-CM | POA: Insufficient documentation

## 2020-05-03 DIAGNOSIS — T148XXA Other injury of unspecified body region, initial encounter: Secondary | ICD-10-CM

## 2020-05-03 DIAGNOSIS — M546 Pain in thoracic spine: Secondary | ICD-10-CM | POA: Insufficient documentation

## 2020-05-03 MED ORDER — ACETAMINOPHEN 500 MG PO TABS
1000.0000 mg | ORAL_TABLET | Freq: Once | ORAL | Status: AC
  Administered 2020-05-03: 1000 mg via ORAL
  Filled 2020-05-03: qty 2

## 2020-05-03 NOTE — ED Notes (Signed)
Pt verbalized understanding of discharge instructions. Follow up care and pain management reviewed, pt had no further questions. 

## 2020-05-03 NOTE — Discharge Instructions (Signed)
You can continue to take Tylenol for management of your pain.  Please follow the instructions on the bottle.  Also, please consider topical pain relieving creams such as Voltaran Gel, BioFreeze, or Icy Hot. There is also a pain relieving cream made by [employer]. You should be able to find all of these at your local pharmacy.   Please return to the emergency department with new or worsening symptoms.  I would recommend you follow-up with your primary care provider in 1 week if you find your symptoms have not improved.  It was a pleasure to meet you.

## 2020-05-03 NOTE — ED Notes (Signed)
Patient transported to CT 
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
## 2020-06-29 ENCOUNTER — Inpatient Hospital Stay (HOSPITAL_COMMUNITY)
Admission: EM | Admit: 2020-06-29 | Discharge: 2020-06-30 | DRG: 684 | Disposition: A | Discharge status: Home Health | Payer: Medicare Other | Attending: Family Medicine | Admitting: Family Medicine

## 2020-06-29 ENCOUNTER — Emergency Department (HOSPITAL_COMMUNITY): Payer: Medicare Other

## 2020-06-29 ENCOUNTER — Encounter (HOSPITAL_COMMUNITY): Payer: Self-pay

## 2020-06-29 DIAGNOSIS — M797 Fibromyalgia: Secondary | ICD-10-CM | POA: Diagnosis not present

## 2020-06-29 DIAGNOSIS — Z9841 Cataract extraction status, right eye: Secondary | ICD-10-CM

## 2020-06-29 DIAGNOSIS — Z961 Presence of intraocular lens: Secondary | ICD-10-CM | POA: Diagnosis present

## 2020-06-29 DIAGNOSIS — N179 Acute kidney failure, unspecified: Principal | ICD-10-CM | POA: Diagnosis present

## 2020-06-29 DIAGNOSIS — R531 Weakness: Secondary | ICD-10-CM

## 2020-06-29 DIAGNOSIS — Z8673 Personal history of transient ischemic attack (TIA), and cerebral infarction without residual deficits: Secondary | ICD-10-CM

## 2020-06-29 DIAGNOSIS — K219 Gastro-esophageal reflux disease without esophagitis: Secondary | ICD-10-CM | POA: Diagnosis present

## 2020-06-29 DIAGNOSIS — Z981 Arthrodesis status: Secondary | ICD-10-CM | POA: Diagnosis not present

## 2020-06-29 DIAGNOSIS — Z823 Family history of stroke: Secondary | ICD-10-CM | POA: Diagnosis not present

## 2020-06-29 DIAGNOSIS — D638 Anemia in other chronic diseases classified elsewhere: Secondary | ICD-10-CM | POA: Diagnosis not present

## 2020-06-29 DIAGNOSIS — Z79899 Other long term (current) drug therapy: Secondary | ICD-10-CM | POA: Diagnosis not present

## 2020-06-29 DIAGNOSIS — Z9842 Cataract extraction status, left eye: Secondary | ICD-10-CM | POA: Diagnosis not present

## 2020-06-29 DIAGNOSIS — E876 Hypokalemia: Secondary | ICD-10-CM | POA: Diagnosis not present

## 2020-06-29 DIAGNOSIS — G4730 Sleep apnea, unspecified: Secondary | ICD-10-CM | POA: Diagnosis not present

## 2020-06-29 DIAGNOSIS — R339 Retention of urine, unspecified: Secondary | ICD-10-CM | POA: Diagnosis present

## 2020-06-29 DIAGNOSIS — Z20822 Contact with and (suspected) exposure to covid-19: Secondary | ICD-10-CM | POA: Diagnosis present

## 2020-06-29 DIAGNOSIS — M4802 Spinal stenosis, cervical region: Secondary | ICD-10-CM | POA: Diagnosis not present

## 2020-06-29 DIAGNOSIS — G43909 Migraine, unspecified, not intractable, without status migrainosus: Secondary | ICD-10-CM | POA: Diagnosis present

## 2020-06-29 DIAGNOSIS — Z7982 Long term (current) use of aspirin: Secondary | ICD-10-CM

## 2020-06-29 DIAGNOSIS — E7800 Pure hypercholesterolemia, unspecified: Secondary | ICD-10-CM | POA: Diagnosis present

## 2020-06-29 DIAGNOSIS — E049 Nontoxic goiter, unspecified: Secondary | ICD-10-CM | POA: Diagnosis present

## 2020-06-29 DIAGNOSIS — R2990 Unspecified symptoms and signs involving the nervous system: Secondary | ICD-10-CM | POA: Diagnosis not present

## 2020-06-29 DIAGNOSIS — Z888 Allergy status to other drugs, medicaments and biological substances status: Secondary | ICD-10-CM | POA: Diagnosis not present

## 2020-06-29 DIAGNOSIS — Z91013 Allergy to seafood: Secondary | ICD-10-CM

## 2020-06-29 DIAGNOSIS — Z8249 Family history of ischemic heart disease and other diseases of the circulatory system: Secondary | ICD-10-CM

## 2020-06-29 DIAGNOSIS — Z79891 Long term (current) use of opiate analgesic: Secondary | ICD-10-CM

## 2020-06-29 DIAGNOSIS — I10 Essential (primary) hypertension: Secondary | ICD-10-CM | POA: Diagnosis present

## 2020-06-29 LAB — I-STAT CHEM 8, ED
BUN: 19 mg/dL (ref 6–20)
Calcium, Ion: 1.06 mmol/L — ABNORMAL LOW (ref 1.15–1.40)
Chloride: 102 mmol/L (ref 98–111)
Creatinine, Ser: 1.1 mg/dL — ABNORMAL HIGH (ref 0.44–1.00)
Glucose, Bld: 89 mg/dL (ref 70–99)
HCT: 38 % (ref 36.0–46.0)
Hemoglobin: 12.9 g/dL (ref 12.0–15.0)
Potassium: 3 mmol/L — ABNORMAL LOW (ref 3.5–5.1)
Sodium: 142 mmol/L (ref 135–145)
TCO2: 26 mmol/L (ref 22–32)

## 2020-06-29 LAB — COMPREHENSIVE METABOLIC PANEL
ALT: 38 U/L (ref 0–44)
AST: 50 U/L — ABNORMAL HIGH (ref 15–41)
Albumin: 2.5 g/dL — ABNORMAL LOW (ref 3.5–5.0)
Alkaline Phosphatase: 91 U/L (ref 38–126)
Anion gap: 11 (ref 5–15)
BUN: 18 mg/dL (ref 6–20)
CO2: 27 mmol/L (ref 22–32)
Calcium: 8.4 mg/dL — ABNORMAL LOW (ref 8.9–10.3)
Chloride: 103 mmol/L (ref 98–111)
Creatinine, Ser: 1.16 mg/dL — ABNORMAL HIGH (ref 0.44–1.00)
GFR calc Af Amer: 59 mL/min — ABNORMAL LOW (ref >60)
GFR calc non Af Amer: 51 mL/min — ABNORMAL LOW (ref >60)
Glucose, Bld: 92 mg/dL (ref 70–99)
Potassium: 3.2 mmol/L — ABNORMAL LOW (ref 3.5–5.1)
Sodium: 141 mmol/L (ref 135–145)
Total Bilirubin: 1.1 mg/dL (ref 0.3–1.2)
Total Protein: 5.2 g/dL — ABNORMAL LOW (ref 6.5–8.1)

## 2020-06-29 LAB — I-STAT BETA HCG BLOOD, ED (MC, WL, AP ONLY): I-stat hCG, quantitative: <5 m[IU]/mL (ref <5)

## 2020-06-29 LAB — TROPONIN I (HIGH SENSITIVITY)
Troponin I (High Sensitivity): 28 ng/L — ABNORMAL HIGH (ref <18)
Troponin I (High Sensitivity): 28 ng/L — ABNORMAL HIGH (ref <18)

## 2020-06-29 LAB — DIFFERENTIAL
Abs Immature Granulocytes: 0.01 10*3/uL (ref 0.00–0.07)
Basophils Absolute: 0 10*3/uL (ref 0.0–0.1)
Basophils Relative: 1 %
Eosinophils Absolute: 0 10*3/uL (ref 0.0–0.5)
Eosinophils Relative: 0 %
Immature Granulocytes: 0 %
Lymphocytes Relative: 33 %
Lymphs Abs: 2.1 10*3/uL (ref 0.7–4.0)
Monocytes Absolute: 0.5 10*3/uL (ref 0.1–1.0)
Monocytes Relative: 8 %
Neutro Abs: 3.8 10*3/uL (ref 1.7–7.7)
Neutrophils Relative %: 58 %

## 2020-06-29 LAB — CBC
HCT: 36.5 % (ref 36.0–46.0)
HCT: 38.5 % (ref 36.0–46.0)
Hemoglobin: 11.6 g/dL — ABNORMAL LOW (ref 12.0–15.0)
Hemoglobin: 12.2 g/dL (ref 12.0–15.0)
MCH: 29.1 pg (ref 26.0–34.0)
MCH: 29.1 pg (ref 26.0–34.0)
MCHC: 31.8 g/dL (ref 30.0–36.0)
MCHC: 31.7 g/dL (ref 30.0–36.0)
MCV: 91.5 fL (ref 80.0–100.0)
MCV: 91.9 fL (ref 80.0–100.0)
Platelets: 203 10*3/uL (ref 150–400)
Platelets: 206 10*3/uL (ref 150–400)
RBC: 3.99 MIL/uL (ref 3.87–5.11)
RBC: 4.19 MIL/uL (ref 3.87–5.11)
RDW: 15.1 % (ref 11.5–15.5)
RDW: 15 % (ref 11.5–15.5)
WBC: 6.5 10*3/uL (ref 4.0–10.5)
WBC: 7 10*3/uL (ref 4.0–10.5)
nRBC: 0 % (ref 0.0–0.2)
nRBC: 0 % (ref 0.0–0.2)

## 2020-06-29 LAB — HIV ANTIBODY (ROUTINE TESTING W REFLEX): HIV Screen 4th Generation wRfx: NONREACTIVE

## 2020-06-29 LAB — TSH: TSH: 0.853 u[IU]/mL (ref 0.350–4.500)

## 2020-06-29 LAB — HEMOGLOBIN A1C
Hgb A1c MFr Bld: 4.7 % — ABNORMAL LOW (ref 4.8–5.6)
Mean Plasma Glucose: 88.19 mg/dL

## 2020-06-29 LAB — MAGNESIUM: Magnesium: 1.4 mg/dL — ABNORMAL LOW (ref 1.7–2.4)

## 2020-06-29 LAB — CREATININE, SERUM
Creatinine, Ser: 0.95 mg/dL (ref 0.44–1.00)
GFR calc Af Amer: >60 mL/min (ref >60)
GFR calc non Af Amer: >60 mL/min (ref >60)

## 2020-06-29 LAB — RESPIRATORY PANEL BY RT PCR (FLU A&B, COVID)
Influenza A by PCR: NEGATIVE
Influenza B by PCR: NEGATIVE
SARS Coronavirus 2 by RT PCR: NEGATIVE

## 2020-06-29 LAB — PROTIME-INR
INR: 1 (ref 0.8–1.2)
Prothrombin Time: 13.1 seconds (ref 11.4–15.2)

## 2020-06-29 LAB — APTT: aPTT: 30 seconds (ref 24–36)

## 2020-06-29 LAB — VITAMIN B12: Vitamin B-12: 370 pg/mL (ref 180–914)

## 2020-06-29 LAB — SEDIMENTATION RATE: Sed Rate: 9 mm/hr (ref 0–22)

## 2020-06-29 LAB — T4, FREE: Free T4: 0.8 ng/dL (ref 0.61–1.12)

## 2020-06-29 MED ORDER — MAGNESIUM SULFATE IN D5W 1-5 GM/100ML-% IV SOLN
1.0000 g | Freq: Once | INTRAVENOUS | Status: AC
  Administered 2020-06-29: 1 g via INTRAVENOUS
  Filled 2020-06-29 (×2): qty 100

## 2020-06-29 MED ORDER — LORATADINE 10 MG PO TABS
10.0000 mg | ORAL_TABLET | Freq: Every day | ORAL | Status: DC
  Administered 2020-06-29 – 2020-06-30 (×2): 10 mg via ORAL
  Filled 2020-06-29 (×2): qty 1

## 2020-06-29 MED ORDER — POTASSIUM CHLORIDE CRYS ER 20 MEQ PO TBCR
40.0000 meq | EXTENDED_RELEASE_TABLET | Freq: Once | ORAL | Status: AC
  Administered 2020-06-29: 40 meq via ORAL
  Filled 2020-06-29: qty 2

## 2020-06-29 MED ORDER — SODIUM CHLORIDE 0.9% FLUSH
3.0000 mL | Freq: Once | INTRAVENOUS | Status: AC
Start: 2020-06-29 — End: 2020-06-29
  Administered 2020-06-29: 3 mL via INTRAVENOUS

## 2020-06-29 MED ORDER — FERROUS SULFATE 325 (65 FE) MG PO TABS
325.0000 mg | ORAL_TABLET | Freq: Every day | ORAL | Status: DC
  Administered 2020-06-29 – 2020-06-30 (×2): 325 mg via ORAL
  Filled 2020-06-29 (×2): qty 1

## 2020-06-29 MED ORDER — ASPIRIN EC 81 MG PO TBEC
81.0000 mg | DELAYED_RELEASE_TABLET | Freq: Every day | ORAL | Status: DC
  Administered 2020-06-29 – 2020-06-30 (×2): 81 mg via ORAL
  Filled 2020-06-29 (×2): qty 1

## 2020-06-29 MED ORDER — SODIUM CHLORIDE 0.9 % IV SOLN: INTRAVENOUS | Status: DC

## 2020-06-29 MED ORDER — LOSARTAN POTASSIUM 50 MG PO TABS
100.0000 mg | ORAL_TABLET | Freq: Every day | ORAL | Status: DC
  Administered 2020-06-29 – 2020-06-30 (×2): 100 mg via ORAL
  Filled 2020-06-29 (×2): qty 2

## 2020-06-29 MED ORDER — PREGABALIN 75 MG PO CAPS
75.0000 mg | ORAL_CAPSULE | Freq: Three times a day (TID) | ORAL | Status: DC
  Administered 2020-06-29 – 2020-06-30 (×4): 75 mg via ORAL
  Filled 2020-06-29 (×3): qty 1
  Filled 2020-06-29: qty 3

## 2020-06-29 MED ORDER — ESCITALOPRAM OXALATE 10 MG PO TABS
10.0000 mg | ORAL_TABLET | Freq: Every day | ORAL | Status: DC
  Administered 2020-06-29: 10 mg via ORAL
  Filled 2020-06-29: qty 1

## 2020-06-29 MED ORDER — FLUTICASONE PROPIONATE 50 MCG/ACT NA SUSP
1.0000 | Freq: Every day | NASAL | Status: DC | PRN
  Filled 2020-06-29: qty 16

## 2020-06-29 MED ORDER — ALBUTEROL SULFATE HFA 108 (90 BASE) MCG/ACT IN AERS
1.0000 | INHALATION_SPRAY | Freq: Four times a day (QID) | RESPIRATORY_TRACT | Status: DC | PRN
  Filled 2020-06-29: qty 6.7

## 2020-06-29 MED ORDER — HEPARIN SODIUM (PORCINE) 5000 UNIT/ML IJ SOLN
5000.0000 [IU] | Freq: Three times a day (TID) | INTRAMUSCULAR | Status: DC
  Administered 2020-06-29 – 2020-06-30 (×3): 5000 [IU] via SUBCUTANEOUS
  Filled 2020-06-29 (×3): qty 1

## 2020-06-29 MED ORDER — POTASSIUM CHLORIDE 10 MEQ/100ML IV SOLN
10.0000 meq | INTRAVENOUS | Status: AC
  Administered 2020-06-29 (×2): 10 meq via INTRAVENOUS
  Filled 2020-06-29 (×2): qty 100

## 2020-06-29 MED ORDER — NITROGLYCERIN 0.4 MG SL SUBL: 0.4000 mg | SUBLINGUAL_TABLET | SUBLINGUAL | Status: DC | PRN

## 2020-06-29 MED ORDER — DULOXETINE HCL 60 MG PO CPEP
60.0000 mg | ORAL_CAPSULE | Freq: Every day | ORAL | Status: DC
  Administered 2020-06-29 – 2020-06-30 (×2): 60 mg via ORAL
  Filled 2020-06-29 (×2): qty 1

## 2020-06-29 MED ORDER — METOPROLOL SUCCINATE ER 25 MG PO TB24
25.0000 mg | ORAL_TABLET | Freq: Every day | ORAL | Status: DC
  Administered 2020-06-29: 25 mg via ORAL
  Filled 2020-06-29: qty 1

## 2020-06-29 MED ORDER — LOSARTAN POTASSIUM-HCTZ 100-12.5 MG PO TABS: 1.0000 | ORAL_TABLET | Freq: Every day | ORAL | Status: DC

## 2020-06-29 MED ORDER — HYDROCHLOROTHIAZIDE 12.5 MG PO CAPS
12.5000 mg | ORAL_CAPSULE | Freq: Every day | ORAL | Status: DC
  Administered 2020-06-29: 12.5 mg via ORAL
  Filled 2020-06-29: qty 1

## 2020-06-29 MED ORDER — HYDROCODONE-ACETAMINOPHEN 10-325 MG PO TABS
1.0000 | ORAL_TABLET | Freq: Every day | ORAL | Status: DC | PRN
  Filled 2020-06-29: qty 1

## 2020-06-29 MED ORDER — METOPROLOL SUCCINATE ER 100 MG PO TB24: 100.0000 mg | ORAL_TABLET | Freq: Every day | ORAL | Status: DC

## 2020-06-29 MED ORDER — MORPHINE SULFATE ER 15 MG PO TBCR
30.0000 mg | EXTENDED_RELEASE_TABLET | Freq: Two times a day (BID) | ORAL | Status: DC
  Administered 2020-06-29 – 2020-06-30 (×3): 30 mg via ORAL
  Filled 2020-06-29 (×3): qty 2

## 2020-06-29 NOTE — Consult Note (Addendum)
Neurology Consultation  Reason for Consult: Left-sided weakness Referring Physician: Margette Fast, MD  CC: Left-sided weakness and also decreased sensation  History is obtained from: Chart and patient  HPI: Jodi Bowman is a 59 y.o. female with history of arthritis, fibromyalgia, dizziness, hypercholesterolemia, hypertension, stroke back in 2000.  Patient states that she woke up approximately 3 days ago and noted that she had a left facial droop along with decreased strength in her left arm and leg followed by decreased sensation.  Patient came to the emergency department today as her symptoms were not resolving.  Patient also states that she has been able to walk due to instability over the past 3 days.  Currently patient is limited in history.  She appears to be annoyed with questions.  And prefers to lay on her side and close her eyes.  LKW: 3 days ago tpa given?: no, out of window Premorbid modified Rankin scale (mRS): 0  NIHSS 1a Level of Conscious: 0 1b LOC Questions:0  1c LOC Commands: 0 2 Best Gaze: 0 3 Visual: 0 4 Facial Palsy: 1 5a Motor Arm - left: 1 5b Motor Arm - Right: 0 6a Motor Leg - Left: 3 6b Motor Leg - Right:0  7 Limb Ataxia: 0 8 Sensory: 1 9 Best Language: 0 10 Dysarthria: 0 11 Extinct. and Inatten.: 0 TOTAL: 6   Past Medical History:  Diagnosis Date   Arthritis    Calculus of gallbladder without mention of cholecystitis or obstruction    Dizziness    Fibromyalgia    GERD (gastroesophageal reflux disease)    Goiter    Hypercholesteremia    Hypertension    Lower back pain    Migraine    Nontoxic uninodular goiter    sees dr vollmer at [facility]   Obesity    PONV (postoperative nausea and vomiting)    Sleep apnea    STOPBANG=5   Stroke ([place]) 2000    Family History  Problem Relation Age of Onset   Diabetes Mother    Hypertension Mother    Transient ischemic attack Mother    Seizures Mother    Dementia Mother    Other Father         MVA   Cancer Brother        colon and lung   Cancer Maternal Grandmother        colon   Social History:   reports that she has never smoked. She has never used smokeless tobacco. She reports previous alcohol use. She reports that she does not use drugs.  Medications  Current Facility-Administered Medications:    albuterol (VENTOLIN HFA) 108 (90 Base) MCG/ACT inhaler 1-2 puff, 1-2 puff, Inhalation, Q6H PRN, Para Skeans, MD   aspirin EC tablet 81 mg, 81 mg, Oral, Daily, Patel, Ekta V, MD   DULoxetine (CYMBALTA) DR capsule 60 mg, 60 mg, Oral, Daily, Patel, Ekta V, MD   escitalopram (LEXAPRO) tablet 10 mg, 10 mg, Oral, QHS, Patel, Ekta V, MD   ferrous sulfate EC tablet 325 mg, 325 mg, Oral, Daily, Patel, Ekta V, MD   fluticasone (FLONASE) 50 MCG/ACT nasal spray 1 spray, 1 spray, Each Nare, Daily PRN, Para Skeans, MD   HYDROcodone-acetaminophen (NORCO) 10-325 MG per tablet 1 tablet, 1 tablet, Oral, 5 X Daily PRN, Para Skeans, MD   loratadine (CLARITIN) tablet 10 mg, 10 mg, Oral, Daily, Patel, Ekta V, MD   losartan-hydrochlorothiazide (HYZAAR) 100-12.5 MG per tablet 1 tablet, 1 tablet,  Oral, Daily, Para Skeans, MD   metoprolol succinate (TOPROL-XL) 24 hr tablet 100 mg, 100 mg, Oral, QHS, Patel, Gretta Cool, MD   morphine (MS CONTIN) 12 hr tablet 30 mg, 30 mg, Oral, Q12H, Patel, Gretta Cool, MD   nitroGLYCERIN (NITROSTAT) SL tablet 0.4 mg, 0.4 mg, Sublingual, Q5 Min x 3 PRN, Posey Pronto, Ekta V, MD   potassium chloride 10 mEq in 100 mL IVPB, 10 mEq, Intravenous, Q1 Hr x 3, Long, Wonda Olds, MD   potassium chloride SA (KLOR-CON) CR tablet 40 mEq, 40 mEq, Oral, Once, Long, Wonda Olds, MD   pregabalin (LYRICA) capsule 75 mg, 75 mg, Oral, TID, Posey Pronto, Ekta V, MD   sodium chloride flush (NS) 0.9 % injection 3 mL, 3 mL, Intravenous, Once, Long, Wonda Olds, MD  Current Outpatient Medications:    albuterol (PROVENTIL HFA) 108 (90 Base) MCG/ACT inhaler, Inhale 1-2 puffs into the lungs every 6 (six) hours as needed  for wheezing or shortness of breath., Disp: , Rfl:    amLODipine (NORVASC) 5 MG tablet, Take 5 mg by mouth daily., Disp: , Rfl:    aspirin EC 81 MG tablet, Take 81 mg by mouth daily., Disp: , Rfl:    D3-50 1.25 MG (50000 UT) capsule, Take 50,000 Units by mouth 3 (three) times a week., Disp: , Rfl:    desoximetasone (TOPICORT) 0.25 % cream, Apply 1 application topically 2 (two) times daily as needed (rash). , Disp: , Rfl:    DULoxetine (CYMBALTA) 60 MG capsule, Take 60 mg by mouth daily., Disp: , Rfl:    escitalopram (LEXAPRO) 10 MG tablet, Take 10 mg by mouth at bedtime., Disp: , Rfl:    ferrous sulfate 325 (65 FE) MG EC tablet, Take 325 mg by mouth daily., Disp: , Rfl:    fluticasone (FLONASE) 50 MCG/ACT nasal spray, Place 1 spray into both nostrils daily as needed for allergies. , Disp: , Rfl: 0   folic acid (FOLVITE) 1 MG tablet, Take 1 mg by mouth daily., Disp: , Rfl: 11   HYDROcodone-acetaminophen (NORCO) 10-325 MG tablet, Take 1 tablet by mouth 5 (five) times daily as needed for moderate pain or severe pain. , Disp: , Rfl:    isosorbide mononitrate (IMDUR) 30 MG 24 hr tablet, Take 1 tablet (30 mg total) by mouth daily., Disp: 30 tablet, Rfl: 0   loratadine (CLARITIN) 10 MG tablet, Take 10 mg by mouth daily. , Disp: , Rfl:    losartan-hydrochlorothiazide (HYZAAR) 100-12.5 MG tablet, Take 1 tablet by mouth daily., Disp: , Rfl:    Magnesium Oxide 400 MG CAPS, Take 1 capsule (400 mg total) by mouth daily., Disp: 15 capsule, Rfl: 0   metoprolol succinate (TOPROL-XL) 100 MG 24 hr tablet, Take 100 mg by mouth at bedtime. , Disp: , Rfl:    morphine (MS CONTIN) 30 MG 12 hr tablet, Take 30 mg by mouth every 12 (twelve) hours., Disp: , Rfl:    nitroGLYCERIN (NITROSTAT) 0.4 MG SL tablet, Place 1 tablet (0.4 mg total) under the tongue every 5 (five) minutes x 3 doses as needed for chest pain. (Patient taking differently: Place 0.4 mg under the tongue every 5 (five) minutes as needed for chest pain (MAXIMUM  OF 3 DOSES). ), Disp: 25 tablet, Rfl: 12   ondansetron (ZOFRAN-ODT) 4 MG disintegrating tablet, Take 4 mg by mouth every 8 (eight) hours as needed for nausea/vomiting., Disp: , Rfl:    pantoprazole (PROTONIX) 40 MG tablet, Take 1 tablet (40 mg total) by  mouth 2 (two) times daily., Disp: 60 tablet, Rfl: 0   polyethylene glycol (MIRALAX / GLYCOLAX) 17 g packet, Take 17 g by mouth daily as needed for mild constipation., Disp: 14 each, Rfl: 0   potassium chloride SA (KLOR-CON) 20 MEQ tablet, Take 1 tablet (20 mEq total) by mouth 2 (two) times daily., Disp: 30 tablet, Rfl: 0   pregabalin (LYRICA) 75 MG capsule, Take 1 capsule (75 mg total) by mouth 3 (three) times daily., Disp: 90 capsule, Rfl: 0   triamcinolone cream (KENALOG) 0.1 %, Apply 1 application topically 2 (two) times daily as needed (rash). , Disp: , Rfl:   ROS:    General ROS: negative for - chills, fatigue, fever, night sweats, weight gain or weight loss Psychological ROS: negative for - behavioral disorder, hallucinations, memory difficulties, mood swings or suicidal ideation Ophthalmic ROS: negative for - blurry vision, double vision, eye pain or loss of vision ENT ROS: negative for - epistaxis, nasal discharge, oral lesions, sore throat, tinnitus or vertigo Respiratory ROS: negative for - cough, hemoptysis, shortness of breath or wheezing Cardiovascular ROS: negative for - chest pain, dyspnea on exertion, edema or irregular heartbeat Gastrointestinal ROS: negative for - abdominal pain, diarrhea, hematemesis, nausea/vomiting or stool incontinence Genito-Urinary ROS: negative for - dysuria, hematuria, incontinence or urinary frequency/urgency Musculoskeletal ROS: Positive for -  muscular weakness Neurological ROS: as noted in HPI Dermatological ROS: negative for rash and skin lesion changes  Exam: Current vital signs: BP 132/67   Pulse (!) 58   Temp 98.8 F (37.1 C) (Oral)   Resp 18   SpO2 99%  Vital signs in last 24  hours: Temp:  [98.8 F (37.1 C)] 98.8 F (37.1 C) (10/01 1119) Pulse Rate:  [58] 58 (10/01 1119) Resp:  [18] 18 (10/01 1119) BP: (132)/(67) 132/67 (10/01 1119) SpO2:  [99 %] 99 % (10/01 1119)   Constitutional: Appears well-developed and well-nourished.  Psych: Decreased decreased affect Eyes: No scleral injection HENT: No OP obstrucion Head: Normocephalic.  Cardiovascular: Normal rate and regular rhythm.  Respiratory: Effort normal, non-labored breathing GI: Soft.  No distension. There is no tenderness.  Skin: WDI  Neuro: Mental Status: Patient is awake, alert, oriented to person, place, month.  Speech is clear with no aphasia or dysarthria.  Naming, repeating, comprehension are intact.  Patient's history is very limited.  Patient is able to follow all commands asked. Cranial Nerves: II: Visual Fields are full.  III,IV, VI: EOMI without ptosis or diploplia. Pupils equal, round and reactive to light V: Facial sensation is symmetric to temperature VII: Facial movement is symmetric.  At rest she may have a very mild left facial droop VIII: hearing is intact to voice X: Palat elevates symmetrically XI: Shoulder shrug is symmetric. XII: tongue is midline without atrophy or fasciculations.  Motor: Patient shows 5/5 strength on right arm and leg.  Patient shows 4/5 strength left arm and states she cannot lift her left leg.  That said she has significant giveaway weakness on left arm.  While watching patient in bed she also is able to move her left leg when not directly being asked.  In addition patient has a positive Hoover's Sensory: Decreased on the left, however should be noted that she splits midline from forehead to sternum. Deep Tendon Reflexes: 2+ and symmetric in the biceps and patellae.  Plantars: Toes are downgoing bilaterally.  Cerebellar: FNF  intact bilaterally  Labs I have reviewed labs in epic and the results pertinent to this consultation  are:   CBC     Component Value Date/Time   WBC 6.5 06/29/2020 1132   RBC 3.99 06/29/2020 1132   HGB 12.9 06/29/2020 1138   HCT 38.0 06/29/2020 1138   HCT 25.6 (L) 08/26/2019 0406   PLT 203 06/29/2020 1132   MCV 91.5 06/29/2020 1132   MCH 29.1 06/29/2020 1132   MCHC 31.8 06/29/2020 1132   RDW 15.1 06/29/2020 1132   LYMPHSABS 2.1 06/29/2020 1132   MONOABS 0.5 06/29/2020 1132   EOSABS 0.0 06/29/2020 1132   BASOSABS 0.0 06/29/2020 1132    CMP     Component Value Date/Time   NA 142 06/29/2020 1138   K 3.0 (L) 06/29/2020 1138   CL 102 06/29/2020 1138   CO2 27 06/29/2020 1132   GLUCOSE 89 06/29/2020 1138   BUN 19 06/29/2020 1138   CREATININE 1.10 (H) 06/29/2020 1138   CALCIUM 8.4 (L) 06/29/2020 1132   PROT 5.2 (L) 06/29/2020 1132   ALBUMIN 2.5 (L) 06/29/2020 1132   AST 50 (H) 06/29/2020 1132   ALT 38 06/29/2020 1132   ALKPHOS 91 06/29/2020 1132   BILITOT 1.1 06/29/2020 1132   GFRNONAA 51 (L) 06/29/2020 1132   GFRAA 59 (L) 06/29/2020 1132    Lipid Panel     Component Value Date/Time   CHOL 101 08/25/2019 1730   TRIG 50 08/25/2019 1730   HDL 59 08/25/2019 1730   CHOLHDL 1.7 08/25/2019 1730   VLDL 10 08/25/2019 1730   LDLCALC 32 08/25/2019 1730     Imaging I have reviewed the images obtained:  CT-scan of the brain-no intracranial pathology  MRI examination of the brain-no acute process or intracranial pathology  Etta Quill PA-C [facility] [phone]  M-F  (9:00 am- 5:00 PM)  06/29/2020, 2:36 PM     Assessment:  This is a 59 year old female presented to the hospital with a 3-day history of inability to walk secondary to weakness on her left side along with what she believes she has is a facial droop on the left along with weakness on the left arm and leg/decreased sensation.  Exam shows multiple inconsistencies such as splitting midline, giveaway weakness, positive Hoover's.  Both CT head and MRI are negative for intracranial pathology.  During exam also  patient was noted to have a very flat affect.  At this time we know she has not had any acute stroke and given patient's exam and multiple functional attributes I question if this is stress related or behavioral.  However given the fact that she states she cannot walk she likely will need to be admitted for gait and physical therapy.  Impression: -Functional neurologic disorder  Recommendations: -Physical therapy -OT -Strength training -No further neurological diagnostics recommended.   Patient seen, examined discussed plan with Theodosia Paling, PA. Agree with assessment and plan as documented above. I have independently reviewed the chart, obtained history, review of systems and examined the patient.I   Dr. Lynnae Sandhoff [phone]

## 2020-06-29 NOTE — ED Provider Notes (Signed)
Emergency Department Provider Note   I have reviewed the triage vital signs and the nursing notes.   HISTORY  Chief Complaint Stroke Symptoms   HPI Jodi Bowman is a 59 y.o. female with past medical history reviewed below including prior stroke presents to the emergency department with left-sided weakness, speech difficulty, numbness with last normal 3 days prior.  Patient is brought in by her sister.  She tells me that the patient is typically up, talkative, ambulatory with a cane.  For the past 3 days she has been unable to walk without significant balance difficulty.  Patient tells me that she is not having pain or headache but feels weak and numb especially in the left arm and leg.  She also finds it difficult to get her words out appropriately. No fever or chills. No radiation of symptoms or modifying factors. No new medications.    Past Medical History:  Diagnosis Date  . Arthritis   . Calculus of gallbladder without mention of cholecystitis or obstruction   . Dizziness   . Fibromyalgia   . GERD (gastroesophageal reflux disease)   . Goiter   . Hypercholesteremia   . Hypertension   . Lower back pain   . Migraine   . Nontoxic uninodular goiter    sees dr vollmer at [facility]  . Obesity   . PONV (postoperative nausea and vomiting)   . Sleep apnea    STOPBANG=5  . Stroke [facility]) 2000    Patient Active Problem List   Diagnosis Date Noted  . Weakness of left side of body 06/29/2020  . ACS (acute coronary syndrome) ([place]) 08/25/2019  . Chest pain 08/25/2019  . Postoperative seroma involving nervous system after nervous system procedure 04/05/2019  . Drainage from wound 04/02/2019  . Infective otitis externa of left ear   . Left otitis media   . AKI (acute kidney injury) ([place])   . Urinary retention   . Orthostasis   . Acute blood loss anemia   . Fibromyalgia   . Chronic pain syndrome   . Lumbar radiculopathy 03/08/2019  . Orthostatic hypotension 03/06/2019    . Postoperative urinary retention 03/06/2019  . Lumbar foraminal stenosis 03/04/2019  . S/P exploratory laparotomy 03/06/2018  . Bowel obstruction ([place]) 03/06/2018  . Chest pain, rule out acute myocardial infarction 12/17/2017  . Hypokalemia 12/17/2017  . Acute lower UTI 12/17/2017  . Vertigo 04/30/2017  . Small vessel disease, cerebrovascular 04/30/2017  . Chronic low back pain 08/01/2015  . Right hip pain 08/01/2015  . Abnormality of gait 08/01/2015  . Left leg weakness   . Low back pain with radiation   . Syncope 07/30/2014  . Atypical chest pain 03/12/2014  . Lap chole [facility] April 2013 01/29/2012  . Gallstones 12/04/2011  . Thyroid nodule-non neoplastic goiter by needle aspiration 12/04/2011  . GLUCOSE INTOLERANCE 03/12/2010  . DYSLIPIDEMIA 03/12/2010  . Chronic migraine 03/12/2010  . Carotid stenosis 03/12/2010  . CEREBROVASCULAR ACCIDENT 03/12/2010  . LIPOMA 01/29/2010  . HEADACHE 01/29/2010  . ANKLE INJURY, RIGHT 04/12/2009  . PHARYNGITIS 03/21/2009  . Backache 03/01/2009  . ALLERGIC RHINITIS 03/17/2007  . LOW BACK PAIN 03/17/2007  . Essential hypertension 01/01/2007  . ANXIETY STATE NOS 09/11/2005    Past Surgical History:  Procedure Laterality Date  . ABDOMINAL HYSTERECTOMY    . BOWEL RESECTION N/A 03/06/2018   Procedure: SMALL BOWEL ANASTAMOSIS;  Surgeon: Clovis Riley, MD;  Location: [place];  Service: General;  Laterality: N/A;  . CATARACT  EXTRACTION W/ INTRAOCULAR LENS IMPLANT Bilateral   . CESAREAN SECTION  yrs ago   done x 2  . CHOLECYSTECTOMY  01/05/2012   Procedure: LAPAROSCOPIC CHOLECYSTECTOMY WITH INTRAOPERATIVE CHOLANGIOGRAM;  Surgeon: Pedro Earls, MD;  Location: [facility];  Service: General;  Laterality: N/A;  . COLONOSCOPY  10/08/2012   Procedure: COLONOSCOPY;  Surgeon: Beryle Beams, MD;  Location: [facility];  Service: Endoscopy;  Laterality: N/A;  . KNEE ARTHROSCOPY  one 1995 and 1 in 1997   both knees done  . LAPAROSCOPY N/A 03/06/2018    Procedure: LAPAROSCOPY DIAGNOSTIC WITH LYSIS OF ADHESIONS;  Surgeon: Clovis Riley, MD;  Location: [place];  Service: General;  Laterality: N/A;  . LAPAROTOMY N/A 03/06/2018   Procedure: EXPLORATORY LAPAROTOMY, RESECTION OF DISTAL ROUX, CLOSURE OF INTERNAL HERNIA;  Surgeon: Clovis Riley, MD;  Location: [place];  Service: General;  Laterality: N/A;  . LEFT HEART CATH AND CORONARY ANGIOGRAPHY N/A 08/29/2019   Procedure: LEFT HEART CATH AND CORONARY ANGIOGRAPHY;  Surgeon: Charolette Forward, MD;  Location: [place] [facility];  Service: Cardiovascular;  Laterality: N/A;  . LUMBAR WOUND DEBRIDEMENT N/A 04/03/2019   Procedure: LUMBAR WOUND WASHOUT;  Surgeon: Judith Part, MD;  Location: [place];  Service: Neurosurgery;  Laterality: N/A;  . POSTERIOR LUMBAR FUSION  03/04/2019  . surgery for endometriosis  yrs ago  . thryoid biopsy  December 01, 2011    at [facility]    Allergies Shrimp [shellfish allergy] and Naproxen  Family History  Problem Relation Age of Onset  . Diabetes Mother   . Hypertension Mother   . Transient ischemic attack Mother   . Seizures Mother   . Dementia Mother   . Other Father        MVA  . Cancer Brother        colon and lung  . Cancer Maternal Grandmother        colon    Social History Social History   Tobacco Use  . Smoking status: Never Smoker  . Smokeless tobacco: Never Used  Vaping Use  . Vaping Use: Never used  Substance Use Topics  . Alcohol use: Not Currently    Alcohol/week: 0.0 standard drinks  . Drug use: No    Review of Systems  Constitutional: No fever/chills Eyes: No visual changes. ENT: No sore throat. Cardiovascular: Denies chest pain. Respiratory: Denies shortness of breath. Gastrointestinal: No abdominal pain.  No nausea, no vomiting.  No diarrhea.  No constipation. Genitourinary: Negative for dysuria. Musculoskeletal: Negative for back pain. Skin: Negative for rash. Neurological: Negative for headaches. Positive left side  weakness/numbness and difficulty with speech.   10-point ROS otherwise negative.  ____________________________________________   PHYSICAL EXAM:  VITAL SIGNS: ED Triage Vitals  Enc Vitals Group     BP 06/29/20 1119 132/67     Pulse Rate 06/29/20 1119 (!) 58     Resp 06/29/20 1119 18     Temp 06/29/20 1119 98.8 F (37.1 C)     Temp Source 06/29/20 1119 Oral     SpO2 06/29/20 1119 99 %   Constitutional: Alert and oriented. Slow to answer but able to provide a reasonable history.  Eyes: Conjunctivae are normal. PERRL. EOMI.  Head: Atraumatic. Nose: No congestion/rhinnorhea. Mouth/Throat: Mucous membranes are moist.   Neck: No stridor.   Cardiovascular: Normal rate, regular rhythm. Good peripheral circulation. Grossly normal heart sounds.   Respiratory: Normal respiratory effort.  No retractions. Lungs CTAB. Gastrointestinal: Soft and nontender. No distention.  Musculoskeletal: No lower extremity tenderness nor edema. No gross deformities of extremities. Neurologic: Speech is delayed. Content is appropriate. 4+/5 weakness on the LUE and LLE with subjective sensory deficit. No clear facial droop but decreased sensation over the left face.  Skin:  Skin is warm, dry and intact. No rash noted.   ____________________________________________   LABS (all labs ordered are listed, but only abnormal results are displayed)  Labs Reviewed  CBC - Abnormal; Notable for the following components:      Result Value   Hemoglobin 11.6 (*)    All other components within normal limits  COMPREHENSIVE METABOLIC PANEL - Abnormal; Notable for the following components:   Potassium 3.2 (*)    Creatinine, Ser 1.16 (*)    Calcium 8.4 (*)    Total Protein 5.2 (*)    Albumin 2.5 (*)    AST 50 (*)    GFR calc non Af Amer 51 (*)    GFR calc Af Amer 59 (*)    All other components within normal limits  MAGNESIUM - Abnormal; Notable for the following components:   Magnesium 1.4 (*)    All other  components within normal limits  HEMOGLOBIN A1C - Abnormal; Notable for the following components:   Hgb A1c MFr Bld 4.7 (*)    All other components within normal limits  CK - Abnormal; Notable for the following components:   Total CK 634 (*)    All other components within normal limits  CBC - Abnormal; Notable for the following components:   RBC 3.66 (*)    Hemoglobin 10.7 (*)    HCT 33.2 (*)    All other components within normal limits  COMPREHENSIVE METABOLIC PANEL - Abnormal; Notable for the following components:   Potassium 3.1 (*)    Calcium 8.1 (*)    Total Protein 4.5 (*)    Albumin 2.0 (*)    All other components within normal limits  I-STAT CHEM 8, ED - Abnormal; Notable for the following components:   Potassium 3.0 (*)    Creatinine, Ser 1.10 (*)    Calcium, Ion 1.06 (*)    All other components within normal limits  TROPONIN I (HIGH SENSITIVITY) - Abnormal; Notable for the following components:   Troponin I (High Sensitivity) 28 (*)    All other components within normal limits  TROPONIN I (HIGH SENSITIVITY) - Abnormal; Notable for the following components:   Troponin I (High Sensitivity) 28 (*)    All other components within normal limits  RESPIRATORY PANEL BY RT PCR (FLU A&B, COVID)  PROTIME-INR  APTT  DIFFERENTIAL  T4, FREE  VITAMIN B12  TSH  SEDIMENTATION RATE  MAGNESIUM  PHOSPHORUS  C-REACTIVE PROTEIN  HIV ANTIBODY (ROUTINE TESTING W REFLEX)  CBC  CREATININE, SERUM  CBG MONITORING, ED  I-STAT BETA HCG BLOOD, ED ([facility], [facility], [facility] ONLY)   ____________________________________________  EKG   EKG Interpretation  Date/Time:  Friday June 29 2020 11:23:13 EDT Ventricular Rate:  58 PR Interval:  132 QRS Duration: 82 QT Interval:  456 QTC Calculation: 447 R Axis:   32 Text Interpretation: Sinus bradycardia Nonspecific T wave abnormality Abnormal ECG No STEMI Confirmed by Nanda Quinton [phone]) on 06/29/2020 1:54:43 PM        ____________________________________________  RADIOLOGY  CT HEAD WO CONTRAST  Result Date: 06/29/2020 CLINICAL DATA:  Neurological deficit.  Unsteady gait. EXAM: CT HEAD WITHOUT CONTRAST TECHNIQUE: Contiguous axial images were obtained from the base of the skull through the vertex without  intravenous contrast. COMPARISON:  05/03/2020 FINDINGS: Brain: No evidence of acute infarction, hemorrhage, hydrocephalus, extra-axial collection or mass lesion/mass effect. Vascular: No hyperdense vessel or unexpected calcification. Skull: No osseous abnormality. Sinuses/Orbits: Visualized paranasal sinuses are clear. Visualized mastoid sinuses are clear. Visualized orbits demonstrate no focal abnormality. Other: None IMPRESSION: No acute intracranial pathology. Electronically Signed   By: Kathreen Devoid   On: 06/29/2020 11:52    ____________________________________________   PROCEDURES  Procedure(s) performed:   Procedures  CRITICAL CARE Performed by: Margette Fast Total critical care time: 35 minutes Critical care time was exclusive of separately billable procedures and treating other patients. Critical care was necessary to treat or prevent imminent or life-threatening deterioration. Critical care was time spent personally by me on the following activities: development of treatment plan with patient and/or surrogate as well as nursing, discussions with consultants, evaluation of patient's response to treatment, examination of patient, obtaining history from patient or surrogate, ordering and performing treatments and interventions, ordering and review of laboratory studies, ordering and review of radiographic studies, pulse oximetry and re-evaluation of patient's condition.  Nanda Quinton, MD Emergency Medicine  ____________________________________________   INITIAL IMPRESSION / ASSESSMENT AND PLAN / ED COURSE  Pertinent labs & imaging results that were available during my care of the patient  were reviewed by me and considered in my medical decision making (see chart for details).   Patient presents emergency department for evaluation of strokelike symptoms over the past 3 days.  She does have some left-sided weakness and numbness on my exam.  Patient has had a prior stroke on this side.  Question recrudescence of old stroke territory versus acute infarct.  CT imaging without acute findings.  Have consulted neurology and will send patient for MRI.   Discussed patient's case with [facility] to request admission. Patient and family (if present) updated with plan. Care transferred to [facility] service.  I reviewed all nursing notes, vitals, pertinent old records, EKGs, labs, imaging (as available).  ____________________________________________  FINAL CLINICAL IMPRESSION(S) / ED DIAGNOSES  Final diagnoses:  Stroke-like symptoms     MEDICATIONS GIVEN DURING THIS VISIT:  Medications  potassium chloride 10 mEq in 100 mL IVPB (10 mEq Intravenous New Bag/Given 06/29/20 1942)  sodium chloride flush (NS) 0.9 % injection 3 mL (3 mLs Intravenous Given 06/29/20 1500)  potassium chloride SA (KLOR-CON) CR tablet 40 mEq (40 mEq Oral Given 06/29/20 1447)  magnesium sulfate IVPB 1 g 100 mL (1 g Intravenous New Bag/Given 06/29/20 2335)     Note:  This document was prepared using Dragon voice recognition software and may include unintentional dictation errors.  Nanda Quinton, MD, [facility] Emergency Medicine    Nikkolas Coomes, [facility], MD 07/04/20 [phone]

## 2020-06-29 NOTE — ED Triage Notes (Signed)
Pt arrives to ED from home after being sent by PCP for concern of possible stroke. Pt experienced unsteady gait, L sided weakness, and L sided numbness that started Tuesday.

## 2020-06-29 NOTE — H&P (Signed)
History and Physical    Jodi Bowman ZPH:[id number] DOB: April 12, 1961 DOA: 06/29/2020  PCP: Aletha Halim., PA-C    Patient coming from: Home  Chief Complaint:  Left-sided weakness.   HPI: Jodi Bowman is a 59 y.o. female with medical history significant of hypertension, history of stroke, GERD, goiter Seen in the emergency room for left-sided weakness.  Patient is sleepy and is a poor historian. HPI is therefore limited and firm chart review and ED providers note.  Per ED providers note patient sister was here earlier we will told him that Patient is not her baseline usually she is talkative and ambulates with a cane and has not been able to do this for the past few days. No falls and no trauma.  Patient is arousable and she reports that  this is new weakness on left side of body and both upper and lower extremities.  Pt has h/o cva. AND exam shows weakness marked on left side and different from right.  No slurred speech blurred vision no palpitations. No family at bedside. Pt states ordinarily she walks with a cane and is not weak.  ED Course:  Blood pressure 132/67, pulse (!) 58, temperature 98.8 F (37.1 C), temperature source Oral, resp. rate 18, SpO2 99 %. Labs show hypokalemia with a potassium of 3.2, AKI with a serum creatinine of 1.16 and GFR of 59, hemoglobin of 11.6 however this is chronic. Respiratory panel is negative.  CT head noncontrast is negative, MRI of the brain noncontrast is negative, MR C-spine done which shows degenerative changes of the spine with mild spinal stenosis C4-C5 through C6 and C7. And moderate degenerative foraminal stenosis of the left side of C5 and bilateral C6 nerve levels.  Chronic thyroid goiter that has had biopsy of it dominant nodule in 2015. Neurology has been consulted in the emergency room.  Review of Systems: As per HPI otherwise all systems reviewed and negative.  Past Medical History:  Diagnosis Date  . Arthritis   .  Calculus of gallbladder without mention of cholecystitis or obstruction   . Dizziness   . Fibromyalgia   . GERD (gastroesophageal reflux disease)   . Goiter   . Hypercholesteremia   . Hypertension   . Lower back pain   . Migraine   . Nontoxic uninodular goiter    sees dr vollmer at [facility]  . Obesity   . PONV (postoperative nausea and vomiting)   . Sleep apnea    STOPBANG=5  . Stroke [facility]) 2000    Past Surgical History:  Procedure Laterality Date  . ABDOMINAL HYSTERECTOMY    . BOWEL RESECTION N/A 03/06/2018   Procedure: SMALL BOWEL ANASTAMOSIS;  Surgeon: Clovis Riley, MD;  Location: [place];  Service: General;  Laterality: N/A;  . CATARACT EXTRACTION W/ INTRAOCULAR LENS IMPLANT Bilateral   . CESAREAN SECTION  yrs ago   done x 2  . CHOLECYSTECTOMY  01/05/2012   Procedure: LAPAROSCOPIC CHOLECYSTECTOMY WITH INTRAOPERATIVE CHOLANGIOGRAM;  Surgeon: Pedro Earls, MD;  Location: [facility];  Service: General;  Laterality: N/A;  . COLONOSCOPY  10/08/2012   Procedure: COLONOSCOPY;  Surgeon: Beryle Beams, MD;  Location: [facility];  Service: Endoscopy;  Laterality: N/A;  . KNEE ARTHROSCOPY  one 1995 and 1 in 1997   both knees done  . LAPAROSCOPY N/A 03/06/2018   Procedure: LAPAROSCOPY DIAGNOSTIC WITH LYSIS OF ADHESIONS;  Surgeon: Clovis Riley, MD;  Location: [place];  Service: General;  Laterality: N/A;  .  LAPAROTOMY N/A 03/06/2018   Procedure: EXPLORATORY LAPAROTOMY, RESECTION OF DISTAL ROUX, CLOSURE OF INTERNAL HERNIA;  Surgeon: Clovis Riley, MD;  Location: [place];  Service: General;  Laterality: N/A;  . LEFT HEART CATH AND CORONARY ANGIOGRAPHY N/A 08/29/2019   Procedure: LEFT HEART CATH AND CORONARY ANGIOGRAPHY;  Surgeon: Charolette Forward, MD;  Location: [facility];  Service: Cardiovascular;  Laterality: N/A;  . LUMBAR WOUND DEBRIDEMENT N/A 04/03/2019   Procedure: LUMBAR WOUND WASHOUT;  Surgeon: Judith Part, MD;  Location: [place];  Service: Neurosurgery;   Laterality: N/A;  . POSTERIOR LUMBAR FUSION  03/04/2019  . surgery for endometriosis  yrs ago  . thryoid biopsy  December 01, 2011    at [facility]     reports that she has never smoked. She has never used smokeless tobacco. She reports previous alcohol use. She reports that she does not use drugs.  Allergies  Allergen Reactions  . Shrimp [Shellfish Allergy] Anaphylaxis  . Naproxen Other (See Comments)    Makes stomach cramp and burn badly    Family History  Problem Relation Age of Onset  . Diabetes Mother   . Hypertension Mother   . Transient ischemic attack Mother   . Seizures Mother   . Dementia Mother   . Other Father        MVA  . Cancer Brother        colon and lung  . Cancer Maternal Grandmother        colon    Prior to Admission medications   Medication Sig Start Date End Date Taking? Authorizing Provider  albuterol (PROVENTIL HFA) 108 (90 Base) MCG/ACT inhaler Inhale 1-2 puffs into the lungs every 6 (six) hours as needed for wheezing or shortness of breath.   Yes [provider]  amLODipine (NORVASC) 5 MG tablet Take 5 mg by mouth daily. 01/14/20  Yes [provider]  aspirin EC 81 MG tablet Take 81 mg by mouth daily.   Yes [provider]  D3-50 1.25 MG (50000 UT) capsule Take 50,000 Units by mouth 3 (three) times a week. 05/06/20  Yes [provider]  desoximetasone (TOPICORT) 0.25 % cream Apply 1 application topically 2 (two) times daily as needed (rash).  01/26/20  Yes [provider]  DULoxetine (CYMBALTA) 60 MG capsule Take 60 mg by mouth daily. 08/11/19  Yes [provider]  escitalopram (LEXAPRO) 10 MG tablet Take 10 mg by mouth at bedtime. 05/04/20  Yes [provider]  ferrous sulfate 325 (65 FE) MG EC tablet Take 325 mg by mouth daily. 07/26/19  Yes [provider]  fluticasone (FLONASE) 50 MCG/ACT nasal spray Place 1 spray into both nostrils daily as needed for allergies.  11/08/17  Yes [provider]  folic acid (FOLVITE) 1 MG tablet Take 1 mg by mouth daily. 10/14/17  Yes [provider]  HYDROcodone-acetaminophen (NORCO) 10-325 MG tablet Take 1 tablet by mouth 5 (five) times daily as needed for moderate pain or severe pain.  02/16/20  Yes [provider]  isosorbide mononitrate (IMDUR) 30 MG 24 hr tablet Take 1 tablet (30 mg total) by mouth daily. 11/18/19  Yes Corena Herter, PA-C  loratadine (CLARITIN) 10 MG tablet Take 10 mg by mouth daily.    Yes [provider]  losartan-hydrochlorothiazide (HYZAAR) 100-12.5 MG tablet Take 1 tablet by mouth daily. 01/13/20  Yes [provider]  Magnesium Oxide 400 MG CAPS Take 1 capsule (400 mg total) by  mouth daily. 09/28/19  Yes Carlisle Cater, PA-C  metoprolol succinate (TOPROL-XL) 100 MG 24 hr tablet Take 100 mg by mouth at bedtime.  07/10/19  Yes [provider]  morphine (MS CONTIN) 30 MG 12 hr tablet Take 30 mg by mouth every 12 (twelve) hours. 06/14/20  Yes [provider]  nitroGLYCERIN (NITROSTAT) 0.4 MG SL tablet Place 1 tablet (0.4 mg total) under the tongue every 5 (five) minutes x 3 doses as needed for chest pain. Patient taking differently: Place 0.4 mg under the tongue every 5 (five) minutes as needed for chest pain (MAXIMUM OF 3 DOSES).  03/13/14  Yes Charolette Forward, MD  ondansetron (ZOFRAN-ODT) 4 MG disintegrating tablet Take 4 mg by mouth every 8 (eight) hours as needed for nausea/vomiting. 06/07/20  Yes [provider]  pantoprazole (PROTONIX) 40 MG tablet Take 1 tablet (40 mg total) by mouth 2 (two) times daily. 03/15/19  Yes Angiulli, Lavon Paganini, PA-C  polyethylene glycol (MIRALAX / GLYCOLAX) 17 g packet Take 17 g by mouth daily as needed for mild constipation. 03/08/19  Yes Viona Gilmore D, NP  potassium chloride SA (KLOR-CON) 20 MEQ tablet Take 1 tablet (20 mEq total) by mouth 2 (two) times daily. 09/28/19  Yes Carlisle Cater, PA-C  pregabalin (LYRICA) 75 MG capsule  Take 1 capsule (75 mg total) by mouth 3 (three) times daily. 03/15/19  Yes Angiulli, Lavon Paganini, PA-C  triamcinolone cream (KENALOG) 0.1 % Apply 1 application topically 2 (two) times daily as needed (rash).  01/11/20  Yes [provider]  metoprolol (LOPRESSOR) 50 MG tablet Take 50 mg by mouth 2 (two) times daily.    12/04/11  [provider]  simvastatin (ZOCOR) 20 MG tablet Take 20 mg by mouth at bedtime.    12/04/11  [provider]    Physical Exam: Vitals:   06/29/20 1119  BP: 132/67  Pulse: (!) 58  Resp: 18  Temp: 98.8 F (37.1 C)  TempSrc: Oral  SpO2: 99%    Constitutional: NAD, calm, comfortable Vitals:   06/29/20 1119  BP: 132/67  Pulse: (!) 58  Resp: 18  Temp: 98.8 F (37.1 C)  TempSrc: Oral  SpO2: 99%   Eyes: PERRL, EOMI lids and conjunctivae normal, no nystagmus ENMT: Mucous membranes are moist. Posterior pharynx clear of any exudate or lesions.Normal dentition.  No tongue deviation Neck: normal, supple, no masses, no thyromegaly, no carotid bruit  Respiratory: clear to auscultation bilaterally, no wheezing, no crackles. Normal respiratory effort. No accessory muscle use.  Cardiovascular: Regular rate and rhythm, no murmurs / rubs / gallops. No extremity edema. 2+ pedal pulses. No carotid bruits.  Abdomen: no tenderness, no masses palpated. No hepatosplenomegaly. Bowel sounds positive.  Musculoskeletal: no clubbing / cyanosis. No joint deformity upper and lower extremities. Pt moving all four ext , no contractures. Normal muscle tone.  Skin: no rashes, lesions, ulcers. No induration Neurologic: CN 2-12 grossly intact.  Left upper extremity strength this 3 out of 5, left lower extremity strength is 3 out of 5, DTRs are 2+ in all 4 extremities both biceps and both knees. Psychiatric: Normal judgment and insight. Alert and oriented x 3. Normal mood.   Labs on Admission: I have personally reviewed following labs and imaging studies  CBC: Recent  Labs  Lab 06/29/20 1132 06/29/20 1138  WBC 6.5  --   NEUTROABS 3.8  --   HGB 11.6* 12.9  HCT 36.5 38.0  MCV 91.5  --   PLT 203  --  Basic Metabolic Panel: Recent Labs  Lab 06/29/20 1132 06/29/20 1138 06/29/20 1431  NA 141 142  --   K 3.2* 3.0*  --   CL 103 102  --   CO2 27  --   --   GLUCOSE 92 89  --   BUN 18 19  --   CREATININE 1.16* 1.10*  --   CALCIUM 8.4*  --   --   MG  --   --  1.4*   GFR: CrCl cannot be calculated (Unknown ideal weight.). Liver Function Tests: Recent Labs  Lab 06/29/20 1132  AST 50*  ALT 38  ALKPHOS 91  BILITOT 1.1  PROT 5.2*  ALBUMIN 2.5*   No results for input(s): LIPASE, AMYLASE in the last 168 hours. No results for input(s): AMMONIA in the last 168 hours. Coagulation Profile: Recent Labs  Lab 06/29/20 1132  INR 1.0   Cardiac Enzymes: No results for input(s): CKTOTAL, CKMB, CKMBINDEX, TROPONINI in the last 168 hours. BNP (last 3 results) No results for input(s): PROBNP in the last 8760 hours. HbA1C: Recent Labs    06/29/20 1431  HGBA1C 4.7*   CBG: No results for input(s): GLUCAP in the last 168 hours. Lipid Profile: No results for input(s): CHOL, HDL, LDLCALC, TRIG, CHOLHDL, LDLDIRECT in the last 72 hours. Thyroid Function Tests: Recent Labs    06/29/20 1431  TSH 0.853  FREET4 0.80   Anemia Panel: Recent Labs    06/29/20 1431  VITAMINB12 370   Urine analysis:    Component Value Date/Time   COLORURINE YELLOW 03/06/2018 1251   APPEARANCEUR HAZY (A) 03/06/2018 1251   LABSPEC 1.018 03/06/2018 1251   PHURINE 6.0 03/06/2018 1251   GLUCOSEU NEGATIVE 03/06/2018 1251   HGBUR NEGATIVE 03/06/2018 1251   HGBUR negative 03/01/2009 0853   BILIRUBINUR NEGATIVE 03/06/2018 1251   KETONESUR NEGATIVE 03/06/2018 1251   PROTEINUR NEGATIVE 03/06/2018 1251   UROBILINOGEN 1.0 10/09/2014 2018   NITRITE NEGATIVE 03/06/2018 1251   LEUKOCYTESUR NEGATIVE 03/06/2018 1251   No intake or output data in the 24 hours ending  06/29/20 1808 Lab Results  Component Value Date   CREATININE 1.10 (H) 06/29/2020   CREATININE 1.16 (H) 06/29/2020   CREATININE 0.71 03/08/2020    COVID-19 Labs  No results for input(s): DDIMER, FERRITIN, LDH, CRP in the last 72 hours.  Lab Results  Component Value Date   SARSCOV2NAA NEGATIVE 06/29/2020   [place] NEGATIVE 08/25/2019   [place] NEGATIVE 04/02/2019   SARSCOV2NAA NOT DETECTED 03/02/2019    Radiological Exams on Admission: CT HEAD WO CONTRAST  Result Date: 06/29/2020 CLINICAL DATA:  Neurological deficit.  Unsteady gait. EXAM: CT HEAD WITHOUT CONTRAST TECHNIQUE: Contiguous axial images were obtained from the base of the skull through the vertex without intravenous contrast. COMPARISON:  05/03/2020 FINDINGS: Brain: No evidence of acute infarction, hemorrhage, hydrocephalus, extra-axial collection or mass lesion/mass effect. Vascular: No hyperdense vessel or unexpected calcification. Skull: No osseous abnormality. Sinuses/Orbits: Visualized paranasal sinuses are clear. Visualized mastoid sinuses are clear. Visualized orbits demonstrate no focal abnormality. Other: None  IMPRESSION: No acute intracranial pathology. Electronically Signed   By: Kathreen Devoid   On: 06/29/2020 11:52   MR BRAIN WO CONTRAST  Result Date: 06/29/2020 CLINICAL DATA:  Stroke, follow-up. EXAM: MRI HEAD WITHOUT CONTRAST TECHNIQUE: Multiplanar, multiecho pulse sequences of the brain and surrounding structures were obtained without intravenous contrast. COMPARISON:  06/29/2020 head CT and prior. 03/04/2017 MRI head and prior. FINDINGS: Brain: Cerebral volume is within normal limits. T2/FLAIR hyperintense  foci involving the supratentorial white matter and pons are mildly progressed since prior exam. No acute infarct or intracranial hemorrhage. No midline shift, ventriculomegaly or extra-axial fluid collection. No mass lesion. Vascular: Normal flow voids. Skull and upper cervical spine: Normal marrow  signal. Sinuses/Orbits: Sequela of bilateral lens replacement. Clear paranasal sinuses. No mastoid effusion. Other: None. IMPRESSION: No acute intracranial process. Mild chronic microvascular ischemic changes, progressed since prior exam. Electronically Signed   By: Primitivo Gauze M.D.   On: 06/29/2020 13:52    EKG: Independently reviewed. Sinus bradycardia at 58.  Assessment/Plan Active Problems:   Essential hypertension   AKI (acute kidney injury) (HCC)   Weakness of left side of body   Weakness of left side of body: -D/D include TIA PT/ST/OT. -esr/b12/MRI./MRA/ Vit d def. -neurology consulted ? If conduction studies is appropriate . - fall precaution./neuro check,.   HTN: Hold diuretic due to AKI cont ARB/BB.  AKI: acute will continue ivf. Renally dose med and avoid nephrotoxic meds and contrast.     DVT prophylaxis:  Heparin.  Code Status:  Full code.  Family Communication:  None at bedside.  Disposition Plan:  Home.  Consults called:  None.  Admission status: Inpatient   Para Skeans MD [facility] Pager [phone] If 7PM-7AM, please contact night-coverage [email or web address] Password [facility] 06/29/2020, 6:08 PM

## 2020-06-29 NOTE — ED Notes (Signed)
EDP at bedside. Sister at bedside. Sister states that pt normally walks with cane d/t back surgery hx, not walking normally today

## 2020-06-29 NOTE — ED Notes (Signed)
Clarified NPO order with Dr. Posey Pronto, MD would like pt to remain NPO (except sips with meds) at this time. Pt aware.

## 2020-06-30 ENCOUNTER — Inpatient Hospital Stay (HOSPITAL_COMMUNITY): Payer: Medicare Other

## 2020-06-30 DIAGNOSIS — N179 Acute kidney failure, unspecified: Secondary | ICD-10-CM | POA: Diagnosis not present

## 2020-06-30 DIAGNOSIS — I10 Essential (primary) hypertension: Secondary | ICD-10-CM

## 2020-06-30 DIAGNOSIS — R531 Weakness: Secondary | ICD-10-CM | POA: Diagnosis not present

## 2020-06-30 DIAGNOSIS — F444 Conversion disorder with motor symptom or deficit: Secondary | ICD-10-CM

## 2020-06-30 DIAGNOSIS — M6281 Muscle weakness (generalized): Secondary | ICD-10-CM | POA: Diagnosis not present

## 2020-06-30 LAB — COMPREHENSIVE METABOLIC PANEL
ALT: 32 U/L (ref 0–44)
AST: 39 U/L (ref 15–41)
Albumin: 2 g/dL — ABNORMAL LOW (ref 3.5–5.0)
Alkaline Phosphatase: 81 U/L (ref 38–126)
Anion gap: 5 (ref 5–15)
BUN: 14 mg/dL (ref 6–20)
CO2: 29 mmol/L (ref 22–32)
Calcium: 8.1 mg/dL — ABNORMAL LOW (ref 8.9–10.3)
Chloride: 108 mmol/L (ref 98–111)
Creatinine, Ser: 0.89 mg/dL (ref 0.44–1.00)
GFR calc Af Amer: >60 mL/min (ref >60)
GFR calc non Af Amer: >60 mL/min (ref >60)
Glucose, Bld: 91 mg/dL (ref 70–99)
Potassium: 3.1 mmol/L — ABNORMAL LOW (ref 3.5–5.1)
Sodium: 142 mmol/L (ref 135–145)
Total Bilirubin: 0.8 mg/dL (ref 0.3–1.2)
Total Protein: 4.5 g/dL — ABNORMAL LOW (ref 6.5–8.1)

## 2020-06-30 LAB — CBC
HCT: 33.2 % — ABNORMAL LOW (ref 36.0–46.0)
Hemoglobin: 10.7 g/dL — ABNORMAL LOW (ref 12.0–15.0)
MCH: 29.2 pg (ref 26.0–34.0)
MCHC: 32.2 g/dL (ref 30.0–36.0)
MCV: 90.7 fL (ref 80.0–100.0)
Platelets: 200 10*3/uL (ref 150–400)
RBC: 3.66 MIL/uL — ABNORMAL LOW (ref 3.87–5.11)
RDW: 15 % (ref 11.5–15.5)
WBC: 7 10*3/uL (ref 4.0–10.5)
nRBC: 0 % (ref 0.0–0.2)

## 2020-06-30 LAB — MAGNESIUM: Magnesium: 1.7 mg/dL (ref 1.7–2.4)

## 2020-06-30 LAB — CK: Total CK: 634 U/L — ABNORMAL HIGH (ref 38–234)

## 2020-06-30 LAB — C-REACTIVE PROTEIN: CRP: 0.6 mg/dL (ref <1.0)

## 2020-06-30 LAB — PHOSPHORUS: Phosphorus: 2.7 mg/dL (ref 2.5–4.6)

## 2020-06-30 MED ORDER — TAMSULOSIN HCL 0.4 MG PO CAPS
0.4000 mg | ORAL_CAPSULE | Freq: Every day | ORAL | Status: DC
  Administered 2020-06-30: 0.4 mg via ORAL
  Filled 2020-06-30: qty 1

## 2020-06-30 MED ORDER — POTASSIUM CHLORIDE CRYS ER 20 MEQ PO TBCR
40.0000 meq | EXTENDED_RELEASE_TABLET | Freq: Two times a day (BID) | ORAL | Status: DC
  Administered 2020-06-30: 40 meq via ORAL
  Filled 2020-06-30: qty 2

## 2020-06-30 NOTE — Consult Note (Signed)
Patient refused psychiatric evaluation. She states that:''I never asked any doctor here for psychiatric evaluation and I never told them I am depressed. All I want is for them to put me on my anti-depressant that helped me at home-escitalopram and Duloxetine''.  Recommendations: Please continue patient's anti-depressant that she claims has been helping her at home.  Corena Pilgrim, MD Attending psychiatrist.

## 2020-06-30 NOTE — Progress Notes (Signed)
Nsg Discharge Note  Admit Date:  06/29/2020 Discharge date: 06/30/2020   Jodi Bowman to be D/C'd home with [facility] per MD order.  AVS completed. Patient/caregiver able to verbalize understanding.  Discharge Medication: Allergies as of 06/30/2020      Reactions   Shrimp [shellfish Allergy] Anaphylaxis   Naproxen Other (See Comments)   Makes stomach cramp and burn badly      Medication List    TAKE these medications   amLODipine 5 MG tablet Commonly known as: NORVASC Take 5 mg by mouth daily.   aspirin EC 81 MG tablet Take 81 mg by mouth daily.   D3-50 1.25 MG (50000 UT) capsule Generic drug: Cholecalciferol Take 50,000 Units by mouth 3 (three) times a week.   desoximetasone 0.25 % cream Commonly known as: TOPICORT Apply 1 application topically 2 (two) times daily as needed (rash).   DULoxetine 60 MG capsule Commonly known as: CYMBALTA Take 60 mg by mouth daily.   escitalopram 10 MG tablet Commonly known as: LEXAPRO Take 10 mg by mouth at bedtime.   ferrous sulfate 325 (65 FE) MG EC tablet Take 325 mg by mouth daily.   fluticasone 50 MCG/ACT nasal spray Commonly known as: FLONASE Place 1 spray into both nostrils daily as needed for allergies.   folic acid 1 MG tablet Commonly known as: FOLVITE Take 1 mg by mouth daily.   HYDROcodone-acetaminophen 10-325 MG tablet Commonly known as: NORCO Take 1 tablet by mouth 5 (five) times daily as needed for moderate pain or severe pain.   isosorbide mononitrate 30 MG 24 hr tablet Commonly known as: IMDUR Take 1 tablet (30 mg total) by mouth daily.   loratadine 10 MG tablet Commonly known as: CLARITIN Take 10 mg by mouth daily.   losartan-hydrochlorothiazide 100-12.5 MG tablet Commonly known as: HYZAAR Take 1 tablet by mouth daily.   Magnesium Oxide 400 MG Caps Take 1 capsule (400 mg total) by mouth daily.   metoprolol succinate 100 MG 24 hr tablet Commonly known as: TOPROL-XL Take 100 mg by mouth at bedtime.    morphine 30 MG 12 hr tablet Commonly known as: MS CONTIN Take 30 mg by mouth every 12 (twelve) hours.   nitroGLYCERIN 0.4 MG SL tablet Commonly known as: NITROSTAT Place 1 tablet (0.4 mg total) under the tongue every 5 (five) minutes x 3 doses as needed for chest pain. What changed:   when to take this  reasons to take this   ondansetron 4 MG disintegrating tablet Commonly known as: ZOFRAN-ODT Take 4 mg by mouth every 8 (eight) hours as needed for nausea/vomiting.   pantoprazole 40 MG tablet Commonly known as: PROTONIX Take 1 tablet (40 mg total) by mouth 2 (two) times daily.   polyethylene glycol 17 g packet Commonly known as: MIRALAX / GLYCOLAX Take 17 g by mouth daily as needed for mild constipation.   potassium chloride SA 20 MEQ tablet Commonly known as: KLOR-CON Take 1 tablet (20 mEq total) by mouth 2 (two) times daily.   pregabalin 75 MG capsule Commonly known as: Lyrica Take 1 capsule (75 mg total) by mouth 3 (three) times daily.   Proventil HFA 108 (90 Base) MCG/ACT inhaler Generic drug: albuterol Inhale 1-2 puffs into the lungs every 6 (six) hours as needed for wheezing or shortness of breath.   triamcinolone cream 0.1 % Commonly known as: KENALOG Apply 1 application topically 2 (two) times daily as needed (rash).       Discharge Assessment: Vitals:  06/30/20 0927 06/30/20 1221  BP: 116/74 114/67  Pulse: (!) 58 67  Resp: 16 16  Temp: 98.2 F (36.8 C) 98.7 F (37.1 C)  SpO2: 100% 98%   Skin clean, dry and intact without evidence of skin break down, no evidence of skin tears noted. IV catheter discontinued intact. Site without signs and symptoms of complications - no redness or edema noted at insertion site, patient denies c/o pain - only slight tenderness at site.  Dressing with slight pressure applied.  D/c Instructions-Education: Discharge instructions given to patient/family with verbalized understanding. D/c education completed with  patient/family including follow up instructions, medication list, d/c activities limitations if indicated, with other d/c instructions as indicated by MD - patient able to verbalize understanding, all questions fully answered. Patient instructed to return to ED, call 911, or call MD for any changes in condition.  Patient escorted via [place], and D/C home via private auto.  Atilano Ina, RN 06/30/2020 5:45 PM

## 2020-06-30 NOTE — Discharge Summary (Signed)
Physician Discharge Summary  Jodi Bowman FHL:[id number] DOB: 1961-02-08 DOA: 06/29/2020  PCP: Jodi Bowman., PA-C  Admit date: 06/29/2020 Discharge date: 06/30/2020  Admitted From: Home  Disposition:  Home with [facility]   Recommendations for Outpatient Follow-up:  1. Follow up with PCP in 1-2 weeks 2. Dr. Deatra Ina: Please refer to Uro-GYN for urinary retention     Home Health: PT/OT due to ongoing balance issues  Equipment/Devices: None new  Discharge Condition: Fair  CODE STATUS: FULL Diet recommendation: Cardiac  Brief/Interim Summary: Jodi Bowman is a 59 y.o. F with hx migraines, fibromyalgia, HTN, hx CVA, and hx gastric bypass who presented with weakness for several days.  Evidently, she and sisters began to notice increased weakness, thought more on the left about 3 days prior to admission.  This did not get better, so the patient came to the ER.             PRINCIPAL HOSPITAL DIAGNOSIS: Deconditioning    Discharge Diagnoses:   Deconditioning Patient admitted and underwent MRI brain which was normal and MRI C/T/L-spine which was unremarkable, showed no cause for leg weakness or urinary retention.  Her TSH, B12, and ESR were normal.  CK minimally elevated, nonspecific, inconsistent with exam. She was evaluated by Neurology, and her exam was nonorganic.  Because her pattern of symptoms and testing are inconsistent with any discernible central or peripheral nerve or muscle disease, it is believed she is just deconditioned.  Depression less likely. Stroke and spinal cord compression ruled out. Do not suspect myositis.  The patient may have had a recent runny nose, and a URI might accentuate deconditioning.  Opiates are also likely contributing.   Urinary retention Patient appeared to have some acute on chronic urinary retention.   -Recommend Urology follow up as an outpatient  Hypertension Cerebrovascular disease secondary prevention Continue aspirin,  losartan, metoprolol  Migraine syndrome Fibromyalgia Continue escitalopram, duloxetine, Lyrica  Anemia of chronic disease  Hypokalemia Supplemented            Discharge Instructions  Discharge Instructions    Diet - low sodium heart healthy   Complete by: As directed    Increase activity slowly   Complete by: As directed      Allergies as of 06/30/2020      Reactions   Shrimp [shellfish Allergy] Anaphylaxis   Naproxen Other (See Comments)   Makes stomach cramp and burn badly      Medication List    TAKE these medications   amLODipine 5 MG tablet Commonly known as: NORVASC Take 5 mg by mouth daily.   aspirin EC 81 MG tablet Take 81 mg by mouth daily.   D3-50 1.25 MG (50000 UT) capsule Generic drug: Cholecalciferol Take 50,000 Units by mouth 3 (three) times a week.   desoximetasone 0.25 % cream Commonly known as: TOPICORT Apply 1 application topically 2 (two) times daily as needed (rash).   DULoxetine 60 MG capsule Commonly known as: CYMBALTA Take 60 mg by mouth daily.   escitalopram 10 MG tablet Commonly known as: LEXAPRO Take 10 mg by mouth at bedtime.   ferrous sulfate 325 (65 FE) MG EC tablet Take 325 mg by mouth daily.   fluticasone 50 MCG/ACT nasal spray Commonly known as: FLONASE Place 1 spray into both nostrils daily as needed for allergies.   folic acid 1 MG tablet Commonly known as: FOLVITE Take 1 mg by mouth daily.   HYDROcodone-acetaminophen 10-325 MG tablet Commonly known as: NORCO Take 1 tablet  by mouth 5 (five) times daily as needed for moderate pain or severe pain.   isosorbide mononitrate 30 MG 24 hr tablet Commonly known as: IMDUR Take 1 tablet (30 mg total) by mouth daily.   loratadine 10 MG tablet Commonly known as: CLARITIN Take 10 mg by mouth daily.   losartan-hydrochlorothiazide 100-12.5 MG tablet Commonly known as: HYZAAR Take 1 tablet by mouth daily.   Magnesium Oxide 400 MG Caps Take 1 capsule (400 mg  total) by mouth daily.   metoprolol succinate 100 MG 24 hr tablet Commonly known as: TOPROL-XL Take 100 mg by mouth at bedtime.   morphine 30 MG 12 hr tablet Commonly known as: MS CONTIN Take 30 mg by mouth every 12 (twelve) hours.   nitroGLYCERIN 0.4 MG SL tablet Commonly known as: NITROSTAT Place 1 tablet (0.4 mg total) under the tongue every 5 (five) minutes x 3 doses as needed for chest pain. What changed:   when to take this  reasons to take this   ondansetron 4 MG disintegrating tablet Commonly known as: ZOFRAN-ODT Take 4 mg by mouth every 8 (eight) hours as needed for nausea/vomiting.   pantoprazole 40 MG tablet Commonly known as: PROTONIX Take 1 tablet (40 mg total) by mouth 2 (two) times daily.   polyethylene glycol 17 g packet Commonly known as: MIRALAX / GLYCOLAX Take 17 g by mouth daily as needed for mild constipation.   potassium chloride SA 20 MEQ tablet Commonly known as: KLOR-CON Take 1 tablet (20 mEq total) by mouth 2 (two) times daily.   pregabalin 75 MG capsule Commonly known as: Lyrica Take 1 capsule (75 mg total) by mouth 3 (three) times daily.   Proventil HFA 108 (90 Base) MCG/ACT inhaler Generic drug: albuterol Inhale 1-2 puffs into the lungs every 6 (six) hours as needed for wheezing or shortness of breath.   triamcinolone cream 0.1 % Commonly known as: KENALOG Apply 1 application topically 2 (two) times daily as needed (rash).       Follow-up Information    [facility] Follow up.   Specialty: Home Health Services Why: For home health services. They will call you in 1-2 days to set up a time to come to your house for the first appointment Contact information: [address] [place] [address] [phone]              Allergies  Allergen Reactions  . Shrimp [Shellfish Allergy] Anaphylaxis  . Naproxen Other (See Comments)    Makes stomach cramp and burn badly     Consultations:  Neurology   Procedures/Studies: CT HEAD WO CONTRAST  Result Date: 06/29/2020 CLINICAL DATA:  Neurological deficit.  Unsteady gait. EXAM: CT HEAD WITHOUT CONTRAST TECHNIQUE: Contiguous axial images were obtained from the base of the skull through the vertex without intravenous contrast. COMPARISON:  05/03/2020 FINDINGS: Brain: No evidence of acute infarction, hemorrhage, hydrocephalus, extra-axial collection or mass lesion/mass effect. Vascular: No hyperdense vessel or unexpected calcification. Skull: No osseous abnormality. Sinuses/Orbits: Visualized paranasal sinuses are clear. Visualized mastoid sinuses are clear. Visualized orbits demonstrate no focal abnormality. Other: None IMPRESSION: No acute intracranial pathology. Electronically Signed   By: Kathreen Devoid   On: 06/29/2020 11:52   MR BRAIN WO CONTRAST  Result Date: 06/29/2020 CLINICAL DATA:  Stroke, follow-up. EXAM: MRI HEAD WITHOUT CONTRAST TECHNIQUE: Multiplanar, multiecho pulse sequences of the brain and surrounding structures were obtained without intravenous contrast. COMPARISON:  06/29/2020 head CT and prior. 03/04/2017 MRI head and prior.  FINDINGS: Brain: Cerebral volume is within normal limits. T2/FLAIR hyperintense foci involving the supratentorial white matter and pons are mildly progressed since prior exam. No acute infarct or intracranial hemorrhage. No midline shift, ventriculomegaly or extra-axial fluid collection. No mass lesion. Vascular: Normal flow voids. Skull and upper cervical spine: Normal marrow signal. Sinuses/Orbits: Sequela of bilateral lens replacement. Clear paranasal sinuses. No mastoid effusion. Other: None. IMPRESSION: No acute intracranial process. Mild chronic microvascular ischemic changes, progressed since prior exam. Electronically Signed   By: Primitivo Gauze M.D.   On: 06/29/2020 13:52   MR CERVICAL SPINE WO CONTRAST  Result Date: 06/29/2020 CLINICAL DATA:  59 year old female with  neurologic deficit, unsteady gait. EXAM: MRI CERVICAL SPINE WITHOUT CONTRAST TECHNIQUE: Multiplanar, multisequence MR imaging of the cervical spine was performed. No intravenous contrast was administered. COMPARISON:  Brain MRI and head CT today. Cervical spine CT 05/03/2020. Ultrasound-guided needle biopsy of right thyroid nodule 06/27/2014. FINDINGS: Alignment: Preserved cervical lordosis, improved from the CT last month. Vertebrae: No marrow edema or evidence of acute osseous abnormality. Visualized bone marrow signal is within normal limits. Cord: Cervical and visible upper thoracic spinal cord appear normal. Posterior Fossa, vertebral arteries, paraspinal tissues: Cervicomedullary junction is within normal limits. Brain details reported separately today. Preserved major vascular flow voids in the neck. Multinodular thyroid goiter, dominant 2.4 cm right thyroid nodule, reportedly up to 3.2 cm by ultrasound in 2015 and biopsy that year. No follow-up indicated (ref: J Am Coll Radiol. 2015 Feb;12(2): 143-50). Otherwise negative visible neck soft tissues, lung apices. Disc levels: C2-C3:  Negative. C3-C4: Mild disc bulge and endplate spurring. Mild facet hypertrophy. No spinal stenosis. Mild C4 foraminal stenosis greater on the right. C4-C5: Circumferential disc bulge. Mild endplate spurring. Mild spinal stenosis. No definite cord mass effect. Moderate left and mild right C5 foraminal stenosis. C5-C6: Mild circumferential disc bulge and endplate spurring. Mild facet and ligament flavum hypertrophy. Borderline to mild spinal stenosis. No cord mass effect. Mild to moderate bilateral C6 foraminal stenosis. C6-C7: Mild circumferential disc bulge. Small superimposed central disc protrusion (series 6, image 20). Borderline to mild spinal stenosis. No cord mass effect. No foraminal stenosis. C7-T1:  Mild facet hypertrophy.  No stenosis. No visible upper thoracic spinal stenosis. IMPRESSION: 1. Ordinary degenerative changes  in the cervical spine with up to mild spinal stenosis C4-C5 through C6-C7. No significant cord mass effect and no cord signal abnormality identified. 2. Up to moderate degenerative neural foraminal stenosis at the left C5 and bilateral C6 nerve levels. 3. Chronic thyroid goiter with previously biopsied dominant nodule (2015). Electronically Signed   By: Genevie Ann M.D.   On: 06/29/2020 17:13   MR THORACIC SPINE WO CONTRAST  Result Date: 06/30/2020 CLINICAL DATA:  Altered mental status. Left-sided weakness. Question cord compression. EXAM: MRI THORACIC SPINE WITHOUT CONTRAST TECHNIQUE: Multiplanar, multisequence MR imaging of the thoracic spine was performed. No intravenous contrast was administered. COMPARISON:  CT of the thoracic spine 05/03/2020. FINDINGS: Alignment:  Normal. Vertebrae: No fracture, evidence of discitis, or bone lesion. Cord:  Normal signal and morphology.  No cord compression. Paraspinal and other soft tissues: Small bilateral pleural effusions are seen. Disc levels: The central canal and foramina are widely patent at all levels. Mild disc bulging in the upper thoracic segments is incidentally noted. Also seen are very shallow right paracentral protrusions at T6-7 and T7-8. IMPRESSION: Minimal degenerative disease of the thoracic spine. The central canal is widely patent throughout. Negative for cord compression, signal abnormality or other abnormality  to explain the patient's symptoms. Small bilateral pleural effusions. Electronically Signed   By: Inge Rise M.D.   On: 06/30/2020 16:57   MR LUMBAR SPINE WO CONTRAST  Result Date: 06/30/2020 CLINICAL DATA:  Altered mental status. Left-sided weakness. Question cord compression. EXAM: MRI LUMBAR SPINE WITHOUT CONTRAST TECHNIQUE: Multiplanar, multisequence MR imaging of the lumbar spine was performed. No intravenous contrast was administered. COMPARISON:  MRI lumbar spine 04/27/2019. FINDINGS: Segmentation:  Standard. Alignment:  Normal.  Vertebrae:  No fracture, evidence of discitis, or bone lesion. Conus medullaris and cauda equina: Conus extends to the L1-2 level. Conus and cauda equina appear normal. Paraspinal and other soft tissues: Small left renal cyst incidentally noted. Disc levels: L1-2: Negative. L2-3: Negative. L3-4: Mild facet degenerative change.  Otherwise negative. L4-5: Status post PLIF. Previously seen postoperative seromas have almost completely resolved. The central canal and foramina are widely patent. L5-S1: Status post PLIF. The central canal and foramina are widely patent. IMPRESSION: No acute abnormality or finding to explain the patient's symptoms. The central canal and foramina are widely patent at all levels. Status post L4-S1 laminectomy and fusion without evidence of complication. Electronically Signed   By: Inge Rise M.D.   On: 06/30/2020 17:00   VAS US CAROTID  Result Date: 06/30/2020 Carotid Arterial Duplex Study Indications:       TIA and Weakness. Risk Factors:      Hypertension, hyperlipidemia, prior CVA. Limitations        Today's exam was limited due to the body habitus of the                    patient . Comparison Study:  Prior Carotid duplex from 07/31/14 is available for                    comparison, no significant change noted. Performing Technologist: Sharion Dove RVS  Examination Guidelines: A complete evaluation includes B-mode imaging, spectral Doppler, color Doppler, and power Doppler as needed of all accessible portions of each vessel. Bilateral testing is considered an integral part of a complete examination. Limited examinations for reoccurring indications may be performed as noted.  Right Carotid Findings: +----------+--------+--------+--------+------------------+------------------+           PSV cm/sEDV cm/sStenosisPlaque DescriptionComments           +----------+--------+--------+--------+------------------+------------------+ CCA Prox  77      21                                 intimal thickening +----------+--------+--------+--------+------------------+------------------+ CCA Distal64      22                                intimal thickening +----------+--------+--------+--------+------------------+------------------+ ICA Prox  51      17      1-39%   heterogenous                         +----------+--------+--------+--------+------------------+------------------+ ICA Distal70      29                                                   +----------+--------+--------+--------+------------------+------------------+ ECA       51  8                                                    +----------+--------+--------+--------+------------------+------------------+ +----------+--------+-------+--------+-------------------+           PSV cm/sEDV cmsDescribeArm Pressure (mmHG) +----------+--------+-------+--------+-------------------+ Subclavian115                                        +----------+--------+-------+--------+-------------------+ +---------+--------+--+--------+--+ VertebralPSV cm/s82EDV cm/s26 +---------+--------+--+--------+--+  Left Carotid Findings: +----------+--------+--------+--------+------------------+------------------+           PSV cm/sEDV cm/sStenosisPlaque DescriptionComments           +----------+--------+--------+--------+------------------+------------------+ CCA Prox  102     22                                intimal thickening +----------+--------+--------+--------+------------------+------------------+ CCA Distal107     28                                intimal thickening +----------+--------+--------+--------+------------------+------------------+ ICA Prox  33      10      1-39%   heterogenous                         +----------+--------+--------+--------+------------------+------------------+ ICA Distal81      31                                                    +----------+--------+--------+--------+------------------+------------------+ ECA       52      9                                                    +----------+--------+--------+--------+------------------+------------------+ +----------+--------+--------+--------+-------------------+           PSV cm/sEDV cm/sDescribeArm Pressure (mmHG) +----------+--------+--------+--------+-------------------+ [id number]                                          +----------+--------+--------+--------+-------------------+ +---------+--------+--+--------+-+ VertebralPSV cm/s53EDV cm/s9 +---------+--------+--+--------+-+   Summary: Right Carotid: Velocities in the right ICA are consistent with a 1-39% stenosis. Left Carotid: Velocities in the left ICA are consistent with a 1-39% stenosis. Vertebrals:  Bilateral vertebral arteries demonstrate antegrade flow. Subclavians: Normal flow hemodynamics were seen in bilateral subclavian              arteries. *See table(s) above for measurements and observations.     Preliminary        Subjective: Still weak.  No fever.  No cough, no dyspnea.  No aches, no myalgias.  No confusion, no seizures.  No leg numbness.  Discharge Exam: Vitals:   06/30/20 0927 06/30/20 1221  BP: 116/74 114/67  Pulse: (!) 58 67  Resp: 16 16  Temp: 98.2 F (36.8 C) 98.7 F (  37.1 C)  SpO2: 100% 98%   Vitals:   06/30/20 0030 06/30/20 0402 06/30/20 0927 06/30/20 1221  BP: (!) 99/56 106/64 116/74 114/67  Pulse: 63 60 (!) 58 67  Resp: _0 Temp: 98.1 F (36.7 C) 98.1 F (36.7 C) 98.2 F (36.8 C) 98.7 F (37.1 C)  TempSrc: Oral Oral Oral Oral  SpO2: 98% 97% 100% 98%  Weight:        General: Pt is alert, awake, not in acute distress Cardiovascular: RRR, nl S1-S2, no murmurs appreciated.   No LE edema.   Respiratory: Normal respiratory rate and rhythm.  CTAB without rales or wheezes. Abdominal: Abdomen soft and non-tender.  No distension or HSM.    Neuro/Psych: Strength symmetrically very weak in upper and lower extremities.  Judgment and insight appear normal.   The results of significant diagnostics from this hospitalization (including imaging, microbiology, ancillary and laboratory) are listed below for reference.     Microbiology: Recent Results (from the past 240 hour(s))  Respiratory Panel by RT PCR (Flu A&B, Covid) - Nasopharyngeal Swab     Status: None   Collection Time: 06/29/20 12:44 PM   Specimen: Nasopharyngeal Swab  Result Value Ref Range Status   SARS Coronavirus 2 by RT PCR NEGATIVE NEGATIVE Final    Comment: (NOTE) SARS-CoV-2 target nucleic acids are NOT DETECTED.  The SARS-CoV-2 RNA is generally detectable in upper respiratoy specimens during the acute phase of infection. The lowest concentration of SARS-CoV-2 viral copies this assay can detect is 131 copies/mL. A negative result does not preclude SARS-Cov-2 infection and should not be used as the sole basis for treatment or other patient management decisions. A negative result may occur with  improper specimen collection/handling, submission of specimen other than nasopharyngeal swab, presence of viral mutation(s) within the areas targeted by this assay, and inadequate number of viral copies (<131 copies/mL). A negative result must be combined with clinical observations, patient history, and epidemiological information. The expected result is Negative.  Fact Sheet for Patients:  PinkCheek.be  Fact Sheet for Healthcare Providers:  GravelBags.it  This test is no t yet approved or cleared by the [place] FDA and  has been authorized for detection and/or diagnosis of SARS-CoV-2 by FDA under an Emergency Use Authorization (EUA). This EUA will remain  in effect (meaning this test can be used) for the duration of the COVID-19 declaration under Section 564(b)(1) of the Act, 21 U.S.C. section  360bbb-3(b)(1), unless the authorization is terminated or revoked sooner.     Influenza A by PCR NEGATIVE NEGATIVE Final   Influenza B by PCR NEGATIVE NEGATIVE Final    Comment: (NOTE) The Xpert Xpress SARS-CoV-2/FLU/RSV assay is intended as an aid in  the diagnosis of influenza from Nasopharyngeal swab specimens and  should not be used as a sole basis for treatment. Nasal washings and  aspirates are unacceptable for Xpert Xpress SARS-CoV-2/FLU/RSV  testing.  Fact Sheet for Patients: PinkCheek.be  Fact Sheet for Healthcare Providers: GravelBags.it  This test is not yet approved or cleared by the [place] FDA and  has been authorized for detection and/or diagnosis of SARS-CoV-2 by  FDA under an Emergency Use Authorization (EUA). This EUA will remain  in effect (meaning this test can be used) for the duration of the  Covid-19 declaration under Section 564(b)(1) of the Act, 21  U.S.C. section 360bbb-3(b)(1), unless the authorization is  terminated or revoked. Performed at [facility], [place],  [place] [address]      Labs: BNP (last 3 results) Recent Labs    09/28/19 1349 03/08/20 2015  BNP 124.8* 62.8   Basic Metabolic Panel: Recent Labs  Lab 06/29/20 1132 06/29/20 1138 06/29/20 1431 06/29/20 2006 06/30/20 0144  NA 141 142  --   --  142  K 3.2* 3.0*  --   --  3.1*  CL 103 102  --   --  108  CO2 27  --   --   --  29  GLUCOSE 92 89  --   --  91  BUN 18 19  --   --  14  CREATININE 1.16* 1.10*  --  0.95 0.89  CALCIUM 8.4*  --   --   --  8.1*  MG  --   --  1.4*  --  1.7  PHOS  --   --   --   --  2.7   Liver Function Tests: Recent Labs  Lab 06/29/20 1132 06/30/20 0144  AST 50* 39  ALT 38 32  ALKPHOS 91 81  BILITOT 1.1 0.8  PROT 5.2* 4.5*  ALBUMIN 2.5* 2.0*   No results for input(s): LIPASE, AMYLASE in the last 168 hours. No results for input(s): AMMONIA in the last 168  hours. CBC: Recent Labs  Lab 06/29/20 1132 06/29/20 1138 06/29/20 2006 06/30/20 0144  WBC 6.5  --  7.0 7.0  NEUTROABS 3.8  --   --   --   HGB 11.6* 12.9 12.2 10.7*  HCT 36.5 38.0 38.5 33.2*  MCV 91.5  --  91.9 90.7  PLT 203  --  206 200   Cardiac Enzymes: Recent Labs  Lab 06/30/20 0144  CKTOTAL 634*   BNP: Invalid input(s): POCBNP CBG: No results for input(s): GLUCAP in the last 168 hours. D-Dimer No results for input(s): DDIMER in the last 72 hours. Hgb A1c Recent Labs    06/29/20 1431  HGBA1C 4.7*   Lipid Profile No results for input(s): CHOL, HDL, LDLCALC, TRIG, CHOLHDL, LDLDIRECT in the last 72 hours. Thyroid function studies Recent Labs    06/29/20 1431  TSH 0.853   Anemia work up Recent Labs    06/29/20 1431  VITAMINB12 370   Urinalysis    Component Value Date/Time   COLORURINE YELLOW 03/06/2018 1251   APPEARANCEUR HAZY (A) 03/06/2018 1251   LABSPEC 1.018 03/06/2018 1251   PHURINE 6.0 03/06/2018 1251   GLUCOSEU NEGATIVE 03/06/2018 1251   HGBUR NEGATIVE 03/06/2018 1251   HGBUR negative 03/01/2009 0853   BILIRUBINUR NEGATIVE 03/06/2018 1251   KETONESUR NEGATIVE 03/06/2018 1251   PROTEINUR NEGATIVE 03/06/2018 1251   UROBILINOGEN 1.0 10/09/2014 2018   NITRITE NEGATIVE 03/06/2018 1251   LEUKOCYTESUR NEGATIVE 03/06/2018 1251   Sepsis Labs Invalid input(s): PROCALCITONIN,  WBC,  LACTICIDVEN Microbiology Recent Results (from the past 240 hour(s))  Respiratory Panel by RT PCR (Flu A&B, Covid) - Nasopharyngeal Swab     Status: None   Collection Time: 06/29/20 12:44 PM   Specimen: Nasopharyngeal Swab  Result Value Ref Range Status   SARS Coronavirus 2 by RT PCR NEGATIVE NEGATIVE Final    Comment: (NOTE) SARS-CoV-2 target nucleic acids are NOT DETECTED.  The SARS-CoV-2 RNA is generally detectable in upper respiratoy specimens during the acute phase of infection. The lowest concentration of SARS-CoV-2 viral copies this assay can detect is 131  copies/mL. A negative result does not preclude SARS-Cov-2 infection and should not be used as the sole basis for  treatment or other patient management decisions. A negative result may occur with  improper specimen collection/handling, submission of specimen other than nasopharyngeal swab, presence of viral mutation(s) within the areas targeted by this assay, and inadequate number of viral copies (<131 copies/mL). A negative result must be combined with clinical observations, patient history, and epidemiological information. The expected result is Negative.  Fact Sheet for Patients:  PinkCheek.be  Fact Sheet for Healthcare Providers:  GravelBags.it  This test is no t yet approved or cleared by the [place] FDA and  has been authorized for detection and/or diagnosis of SARS-CoV-2 by FDA under an Emergency Use Authorization (EUA). This EUA will remain  in effect (meaning this test can be used) for the duration of the COVID-19 declaration under Section 564(b)(1) of the Act, 21 U.S.C. section 360bbb-3(b)(1), unless the authorization is terminated or revoked sooner.     Influenza A by PCR NEGATIVE NEGATIVE Final   Influenza B by PCR NEGATIVE NEGATIVE Final    Comment: (NOTE) The Xpert Xpress SARS-CoV-2/FLU/RSV assay is intended as an aid in  the diagnosis of influenza from Nasopharyngeal swab specimens and  should not be used as a sole basis for treatment. Nasal washings and  aspirates are unacceptable for Xpert Xpress SARS-CoV-2/FLU/RSV  testing.  Fact Sheet for Patients: PinkCheek.be  Fact Sheet for Healthcare Providers: GravelBags.it  This test is not yet approved or cleared by the [place] FDA and  has been authorized for detection and/or diagnosis of SARS-CoV-2 by  FDA under an Emergency Use Authorization (EUA). This EUA will remain  in effect (meaning  this test can be used) for the duration of the  Covid-19 declaration under Section 564(b)(1) of the Act, 21  U.S.C. section 360bbb-3(b)(1), unless the authorization is  terminated or revoked. Performed at [facility], [place] [address], [place], [place] [address]      Time coordinating discharge: 45 minutes        SIGNED:   Edwin Dada, MD  [facility] 06/30/2020, 6:01 PM

## 2020-06-30 NOTE — Progress Notes (Signed)
Brief neurology progress note  Patient states she is improved. Weakness is resolving more in the upper extremities than lower. Exam with poor effort and fluctuating with giveaway weakness in the legs - Remains functional. MRI brain did not show acute stroke or mass lesion and MRI cervical spine did not show pathology that would affect lower extremities. PT/OT found significant functional component.  Recommend following up with her PCP No further recommendations Please call for questions.   Lynnae Sandhoff, MD Page: [phone]

## 2020-06-30 NOTE — Progress Notes (Signed)
VASCULAR LAB    Carotid duplex has been performed.  See CV proc for preliminary results.   Eleina Jergens, RVT 06/30/2020, 11:45 AM

## 2020-06-30 NOTE — Plan of Care (Signed)

## 2020-06-30 NOTE — Evaluation (Signed)
Physical Therapy Evaluation Patient Details Name: Jodi Bowman MRN: [id number] DOB: 04/02/1961 Today's Date: 06/30/2020   History of Present Illness  59 y.o. female with medical history significant of hypertension, history of stroke, fibromyalgia, migraine, obesity, goiter presented to ED 06/29/20 with 3 days of left sided weakness. NIHSS=6 CT head negative, MRI of the brain negative, MRI c-spine degenerative changes with moderated foraminal stenosis left C5, C6. Neurology consult "Functional neurologic disorder" with giveaway weakness and midline split in sensation.  Clinical Impression   Pt admitted with above symptoms. She was independent with use of cane when walking (due to back pain) and no residual deficits from prior CVA (per pt). She urgently needed to use bathroom on PT arrival. Assisted to [facility] (which was set up on her Rt/strong side; she requested PT move it to her left/weaker side) with min guard assist. She was able to walk 12 ft with RW minguard assist with decr velocity and incr effort. See below for gait details (displayed non-organic/somatic type deficits). She had one episode of all 4 extremities giving out/buckling with immediately re-engaging and able to catch herself. She reported this occurred due to her left side being weak. Noted to her that her right side also gave out and she reported she was weak all over. She made no reference to light-headedness or dizziness. Discussed PT recommendations for home with 24/7 assist/supervision x 7 days (maybe less depending on her progress) vs go to rehab facility and then home. She was very quick to say she does not want to go to a rehab facility and agreed with plan to go home. Pt currently with functional limitations due to the deficits listed below (see PT Problem List). Pt will benefit from skilled PT to increase their independence and safety with mobility to allow discharge to the venue listed below.     *Patient has all DME needed  (see below) including a wheelchair and a ramp to enter her home.      Follow Up Recommendations Home health PT;Supervision/Assistance - 24 hour (pt reports sister can stay with her while famly at work)    Equipment Recommendations  None recommended by PT (pt has all DME )    Recommendations for Other Services       Precautions / Restrictions Precautions Precautions: Fall      Mobility  Bed Mobility Overal bed mobility: Modified Independent             General bed mobility comments: without cues, pt uses RLE to assist LLE over EOB  Transfers Overall transfer level: Needs assistance Equipment used: Rolling walker (2 wheeled) Transfers: Sit to/from Stand Sit to Stand: Supervision         General transfer comment: vc for sequencing/safest hand placement from bed and from recliner  Ambulation/Gait Ambulation/Gait assistance: Min guard Gait Distance (Feet): 12 Feet Assistive device: Rolling walker (2 wheeled) Gait Pattern/deviations: Step-through pattern;Decreased step length - left;Decreased dorsiflexion - right;Decreased dorsiflexion - left;Decreased weight shift to right Gait velocity: very slow and labored Gait velocity interpretation: <1.31 ft/sec, indicative of household ambulator General Gait Details: pt cued to advande LLE first, followed by step with RLE; she slides LLE forward with incr effort with no incr wt-shift over her RLE as would be expected; she takes full step length with RLE without LLE buckling and then able to advance LLE from behind her body into next step.   Stairs            Emergency planning/management officer  Modified Rankin (Stroke Patients Only)       Balance Overall balance assessment: No apparent balance deficits (not formally assessed)                                           Pertinent Vitals/Pain Pain Assessment: No/denies pain    Home Living Family/patient expects to be discharged to:: Private residence Living  Arrangements: Spouse/significant other;Other relatives (61 yo dtr, step-dtr, 1yo grandchild) Available Help at Discharge: Family;Available 24 hours/day (husband works 7p-7a; step-dtr works 2p-10p; sister available) Type of Home: House Home Access: [place]: One [place]: Toilet riser;Cane - single point;Walker - 2 wheels;Walker - 4 wheels;Bedside commode;Shower seat;Hand held shower head;Wheelchair - manual (riser with handles; gait belts)      Prior Function Level of Independence: Needs assistance   Gait / Transfers Assistance Needed: uses cane when walking (especially with 61 yo in the house)  ADL's / Homemaking Assistance Needed: reports no assist needed        Hand Dominance   Dominant Hand: Right    Extremity/Trunk Assessment   Upper Extremity Assessment Upper Extremity Assessment: Generalized weakness    Lower Extremity Assessment Lower Extremity Assessment: Generalized weakness    Cervical / Trunk Assessment Cervical / Trunk Assessment: Normal  Communication   Communication: No difficulties  Cognition Arousal/Alertness: Awake/alert Behavior During Therapy: Flat affect Overall Cognitive Status: Within Functional Limits for tasks assessed                                 General Comments: a & o x4      General Comments      Exercises     Assessment/Plan    PT Assessment Patient needs continued PT services  PT Problem List Decreased strength;Decreased activity tolerance;Decreased mobility;Decreased knowledge of use of DME       PT Treatment Interventions DME instruction;Gait training;Functional mobility training;Therapeutic activities;Balance training;Neuromuscular re-education;Patient/family education    PT Goals (Current goals can be found in the Care Plan section)  Acute Rehab PT Goals Patient Stated Goal: get stronger PT Goal Formulation: With patient Time For Goal Achievement: 07/14/20 Potential  to Achieve Goals: Good    Frequency Min 4X/week   Barriers to discharge        Co-evaluation               AM-PAC PT "6 Clicks" Mobility  Outcome Measure Help needed turning from your back to your side while in a flat bed without using bedrails?: None Help needed moving from lying on your back to sitting on the side of a flat bed without using bedrails?: None Help needed moving to and from a bed to a chair (including a wheelchair)?: A Little Help needed standing up from a chair using your arms (e.g., wheelchair or bedside chair)?: A Little Help needed to walk in hospital room?: A Little Help needed climbing 3-5 steps with a railing? : Total 6 Click Score: 18    End of Session Equipment Utilized During Treatment: Gait belt Activity Tolerance: Patient limited by fatigue (HR did incr 60 to 88 bpm with activity) Patient left: in chair;with call bell/phone within reach;with chair alarm set Nurse Communication: Mobility status;Other (comment) (RN reported Dr Loleta Books wants a call after eval) PT Visit Diagnosis: Other abnormalities  of gait and mobility (R26.89);Muscle weakness (generalized) (M62.81)    Time: 1225-8346 PT Time Calculation (min) (ACUTE ONLY): 47 min   Charges:   PT Evaluation $PT Eval Moderate Complexity: 1 Mod PT Treatments $Gait Training: 23-37 mins         Arby Barrette, PT Pager [phone]   Jodi Bowman 06/30/2020, 10:38 AM

## 2020-06-30 NOTE — Progress Notes (Signed)
Education given to patient and sister Jodi Bowman on how to self cath. Patient and sister state they have a clear understanding.

## 2020-06-30 NOTE — TOC Transition Note (Signed)
Transition of Care [facility]) - CM/SW Discharge Note   Patient Details  Name: Jodi Bowman MRN: [id number] Date of Birth: 1961-07-06  Transition of Care [facility]) CM/SW Contact:  Carles Collet, RN Phone Number: 06/30/2020, 11:03 AM   Clinical Narrative:    Spoke w patient, she is agreeable to [facility] services, would like to use [facility], ratings discussed. [facility]; accepted. No other CM needs identified.     Final next level of care: [place] Barriers to Discharge: No Barriers Identified   Patient Goals and CMS Choice Patient states their goals for this hospitalization and ongoing recovery are:: to go hme CMS Medicare.gov Compare Post Acute Care list provided to:: Patient Choice offered to / list presented to : Patient  Discharge Placement                       Discharge Plan and Services                          HH Arranged: PT [facility] Agency: [place] Date [place]: 06/30/20 Time [place]: 1103 Representative spoke with at [place]: [place]) Interventions     Readmission Risk Interventions Readmission Risk Prevention Plan 08/29/2019 03/08/2019  Transportation Screening Complete Complete  PCP or Specialist Appt within 5-7 Days - Complete  PCP or Specialist Appt within 3-5 Days Complete -  Home Care Screening - Complete  Medication Review (RN CM) - Complete  HRI or Home Care Consult Complete -  Social Work Consult for Recovery Care Planning/Counseling Complete -  Palliative Care Screening Complete -  Medication Review Press photographer) Complete -  Some recent data might be hidden

## 2020-07-11 IMAGING — CT CT L SPINE W/O CM
1 series · 12 of 14 positions shown, 15 images · non-contrast
Comparison: Multiple exams, including radiographs from 06/21/2019
and lumbar MRI from 04/27/2019

CLINICAL DATA: Leg weakness. History of prior lumbar surgery.

EXAM:
CT LUMBAR SPINE WITHOUT CONTRAST
TECHNIQUE: Multidetector CT imaging of the lumbar spine was performed without
intravenous contrast administration. Multiplanar CT image
reconstructions were also generated.

[Series 2: l spine soft · axial · 0.28mm/px · z∈[+863,+1061]mm · 12 of 78 slices shown, 15 images]
[im 6/78  soft-tissue]
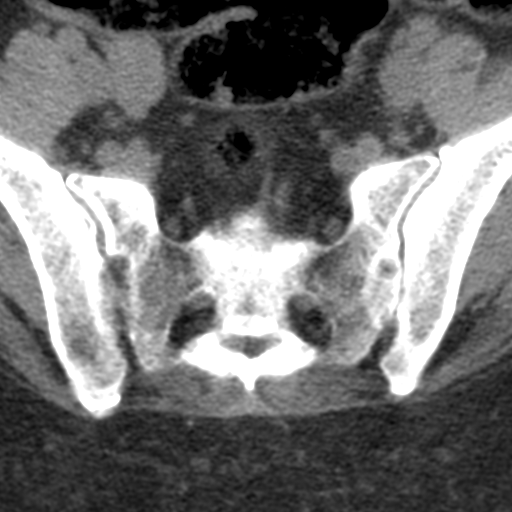
[im 6/78  bone]
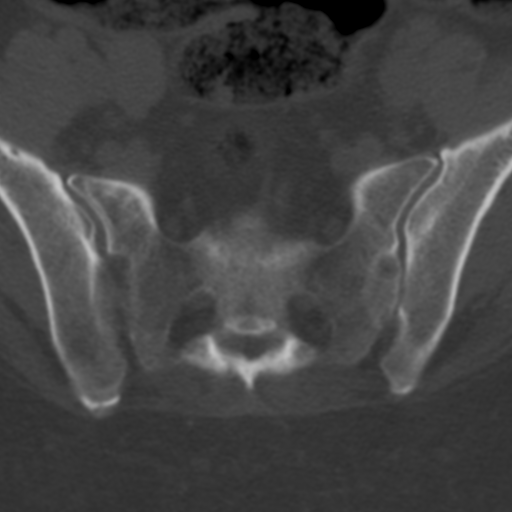
[im 12/78  bone]
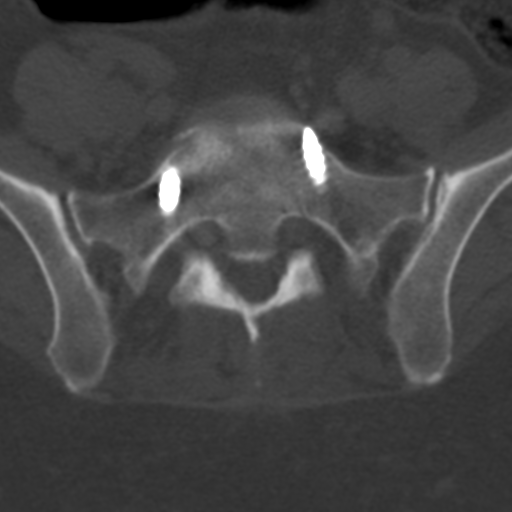
[im 18/78  bone]
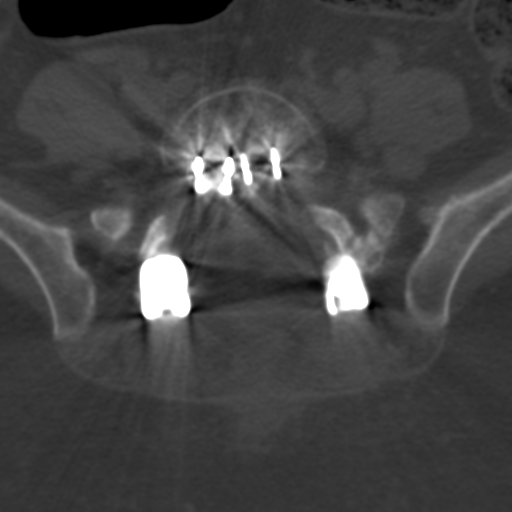
[im 24/78  bone]
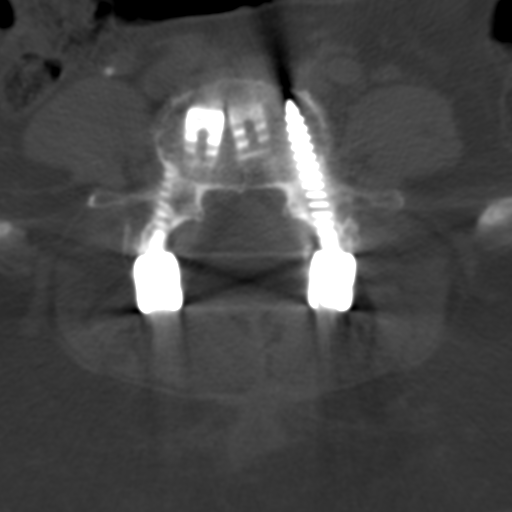
[im 30/78  soft-tissue]
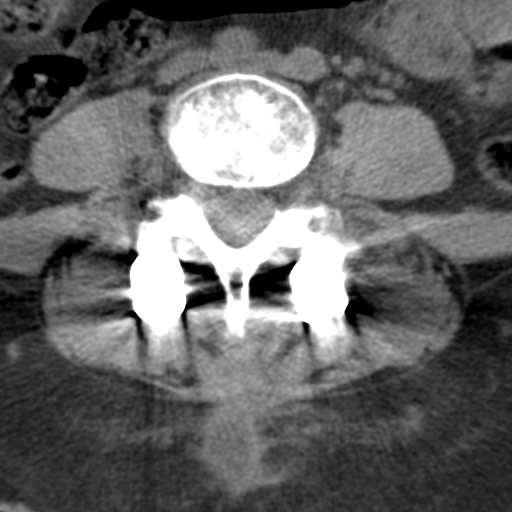
[im 30/78  bone]
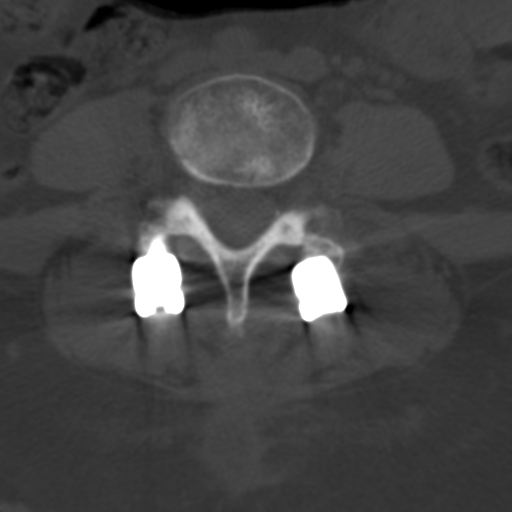
[im 36/78  bone]
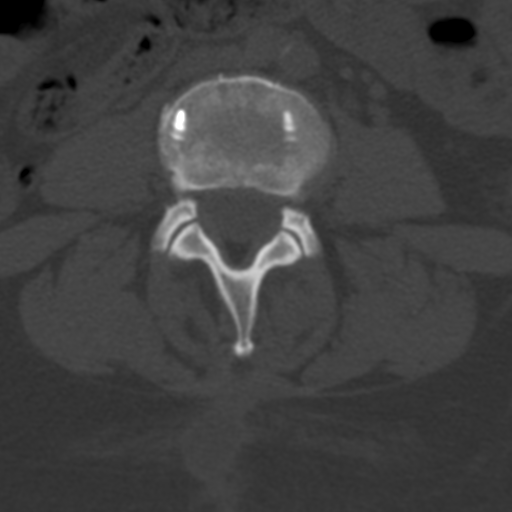
[im 42/78  bone]
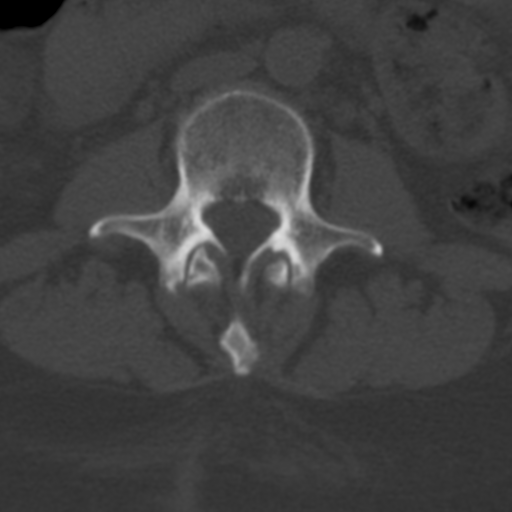
[im 48/78  bone]
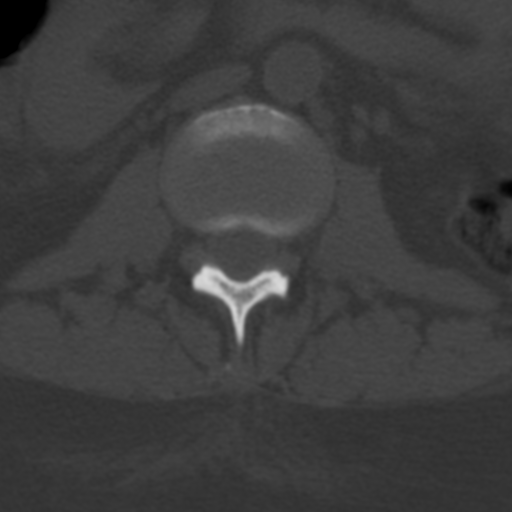
[im 54/78  soft-tissue]
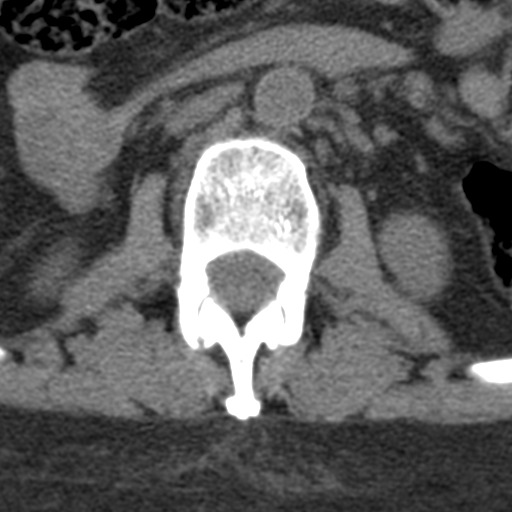
[im 54/78  bone]
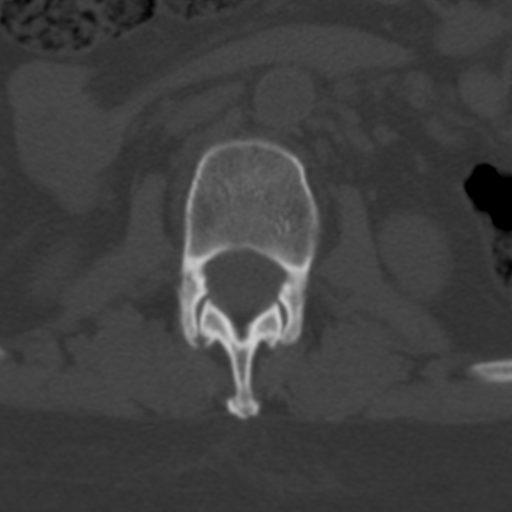
[im 60/78  bone]
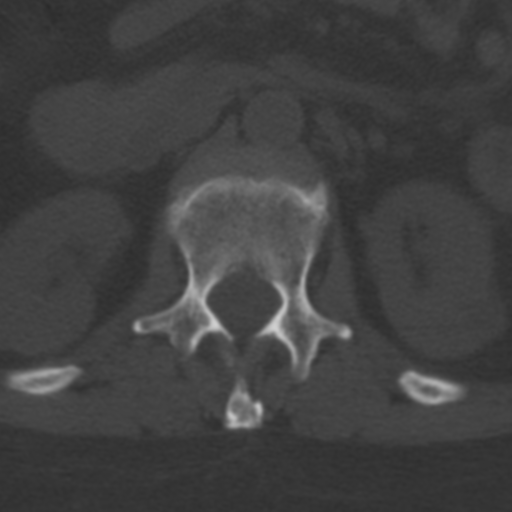
[im 66/78  bone]
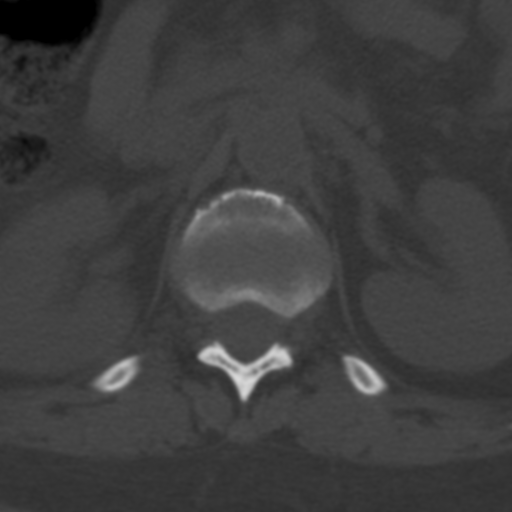
[im 72/78  bone]
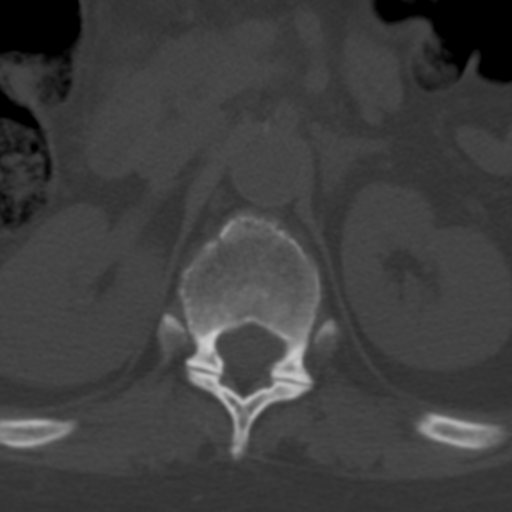

[12 of 14 positions shown; findings below may reference images not displayed]

FINDINGS: Segmentation: The lowest lumbar type non-rib-bearing vertebra is
labeled as L5.

Alignment: No vertebral subluxation is observed.

Vertebrae: Posterolateral rod and pedicle screw fixation noted at
L4-L5-S1 bilaterally with interbody cages at L4-5 and L5-S1, and
posterior decompression at L4-5 and L5-S1. No lucency around the
pedicle screws.

No regional fracture or acute bony findings.

Paraspinal and other soft tissues: Fluid density right kidney upper
pole exophytic lesion on image [DATE], likely a cyst although only
partially included on today's exam.

Postoperative fluid collection in the subcutaneous tissues measuring
2.2 by 1.2 by 5.5 cm (volume = 7.6 cm^3). On prior MRI of
04/27/2019 I measure this collection at 3.2 by 1.3 by 6.3 cm (volume
= 14 cm^3).

Disc levels: L1-2: Unremarkable.

L2-3: Unremarkable.

L3-4: There is potential mild bilateral foraminal stenosis due to
disc bulge and degenerative facet arthropathy.

L4-5: No impingement or complicating features identified.

L5-S1: No impingement or complicating features identified.
IMPRESSION: 1. Postoperative findings at L4-5 and L5-S1 without complicating
feature identified.
2. Suspected mild bilateral foraminal stenosis at L3-4 due to disc
bulge and degenerative facet arthropathy.
3. Interval 46% reduction in volume of the posterior subcutaneous
postoperative fluid collection compared to 04/27/2019.

## 2020-07-23 ENCOUNTER — Other Ambulatory Visit: Payer: Self-pay | Admitting: Surgery

## 2020-07-26 IMAGING — DX DG CHEST 1V PORT
1 series · 1 of 1 positions shown · non-contrast
Comparison: CTA chest dated 10/06/2018

CLINICAL DATA: Chest pain, shortness of breath

EXAM:
PORTABLE CHEST 1 VIEW

[chest ap]
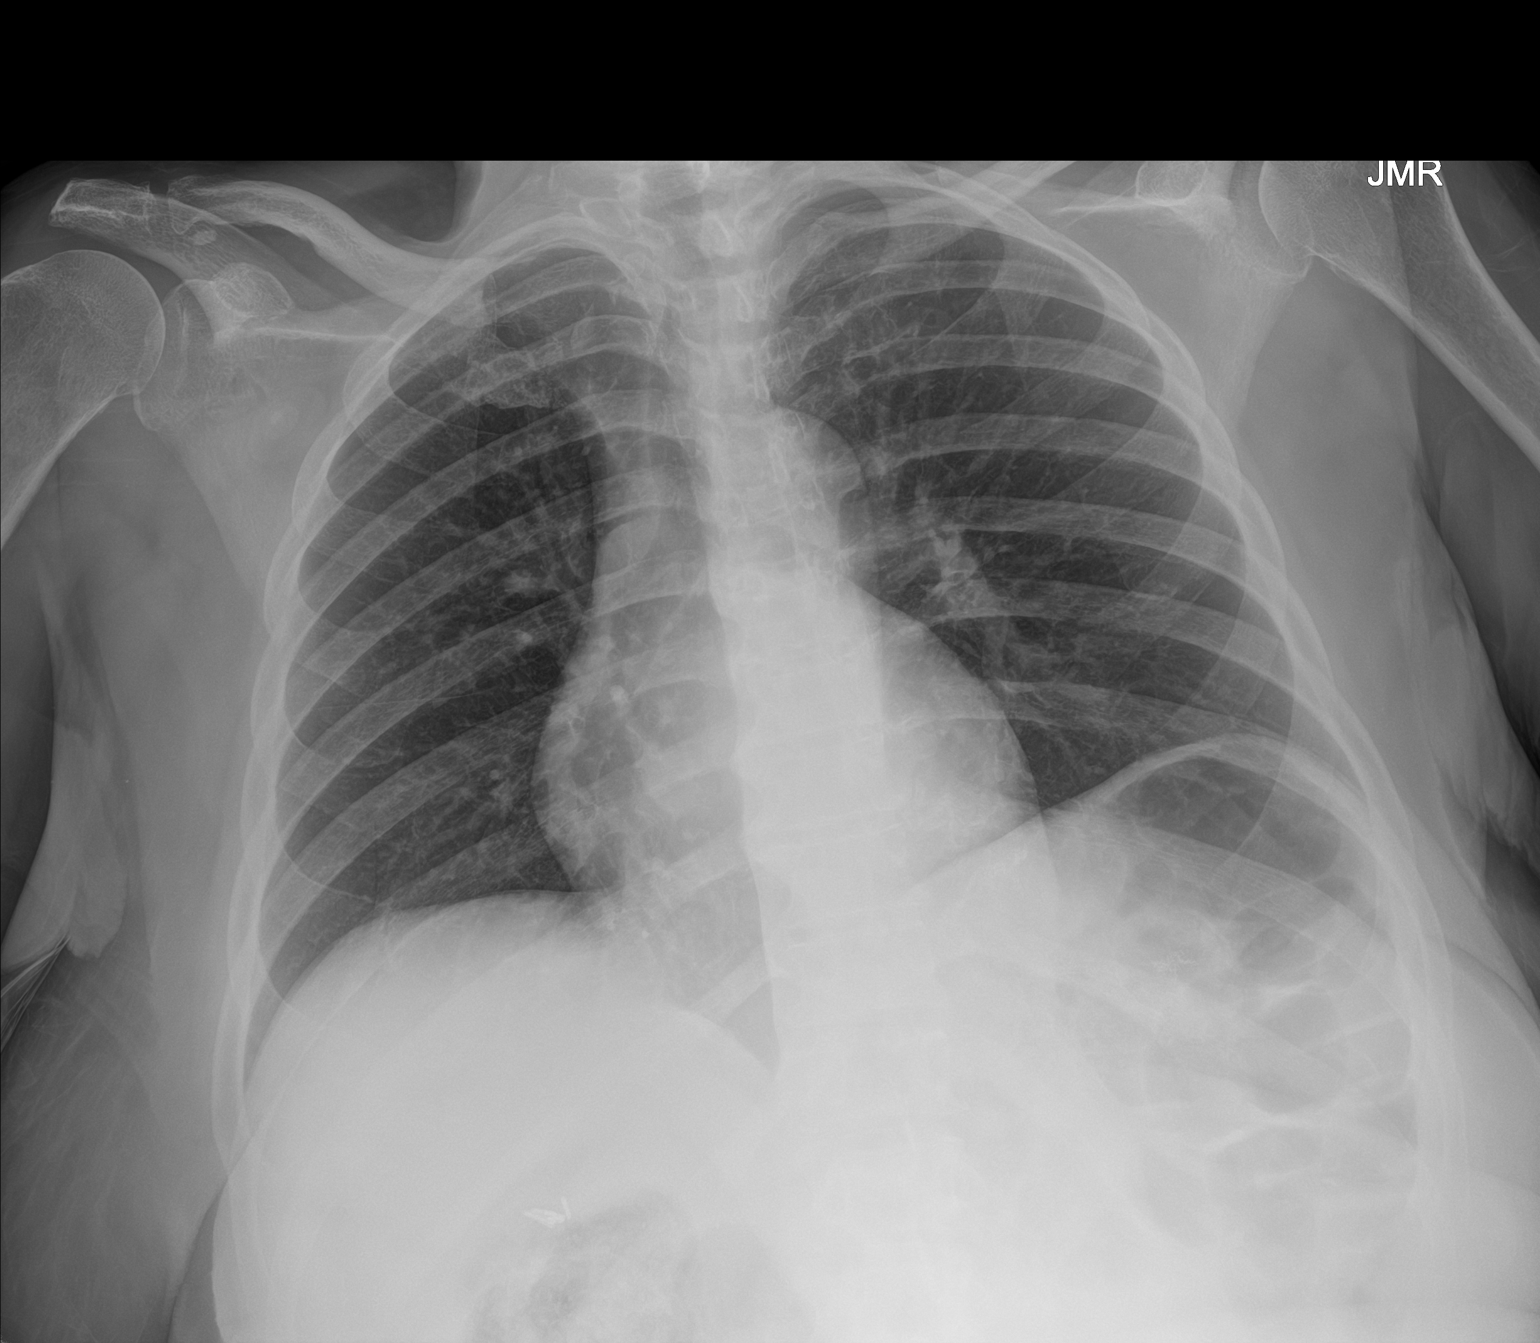

[1 of 1 positions shown; findings below may reference images not displayed]

FINDINGS: Lungs are clear.  No pleural effusion or pneumothorax.

The heart is normal in size.
IMPRESSION: No evidence of acute cardiopulmonary disease.

## 2020-07-26 IMAGING — CT CT ANGIO CHEST-ABD-PELV FOR DISSECTION W/ AND WO/W CM
2 of 7 series · 14 of 46 positions shown, 16 images · IV contrast (APPLIED)
Comparison: CT, 10/06/2018.

CLINICAL DATA: Mid back pain for 2 days.  Hypotension.

EXAM:
CTA CHEST, ABDOMEN, AND PELVIS WITHOUT AND WITH CONTRAST
TECHNIQUE: Multidetector CT imaging of the chest chest was performed prior to
intravenous contrast. CT imaging was then acquired during the
intravenous infusion contrast per standard, through the chest,
abdomen and pelvis. Computer generated coronal MPR and MIP
reconstructed images acquired.
CONTRAST:  70mL OMNIPAQUE IOHEXOL 300 MG/ML  SOLN

[Series 7: arterial · axial · arterial · 0.75mm/px · z∈[+770,+1250]mm · 11 of 282 slices shown, 13 images]
[im 21/282  soft-tissue]
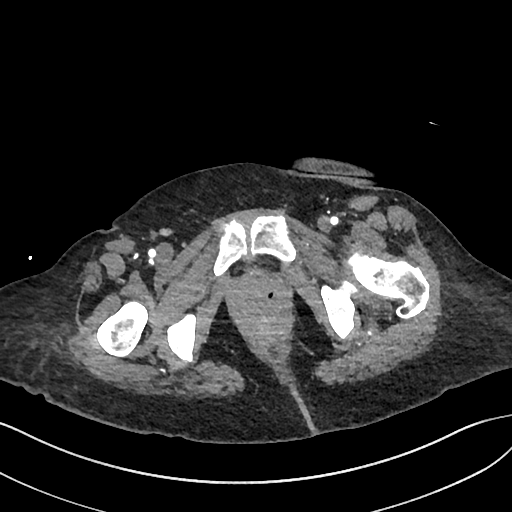
[im 21/282  bone]
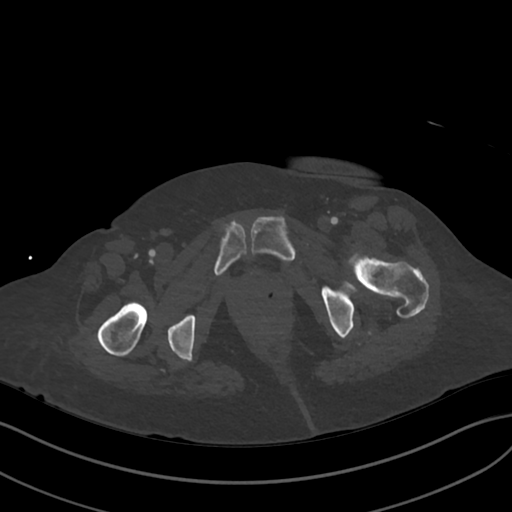
[im 41/282  soft-tissue]
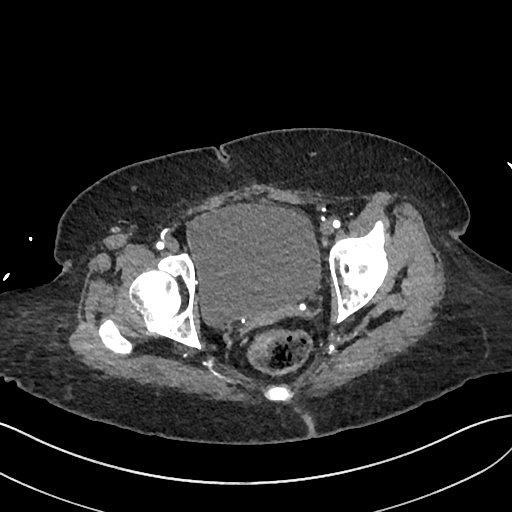
[im 61/282  soft-tissue]
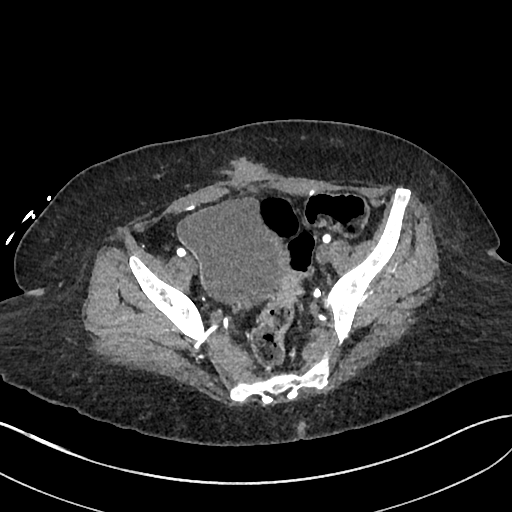
[im 101/282  soft-tissue]
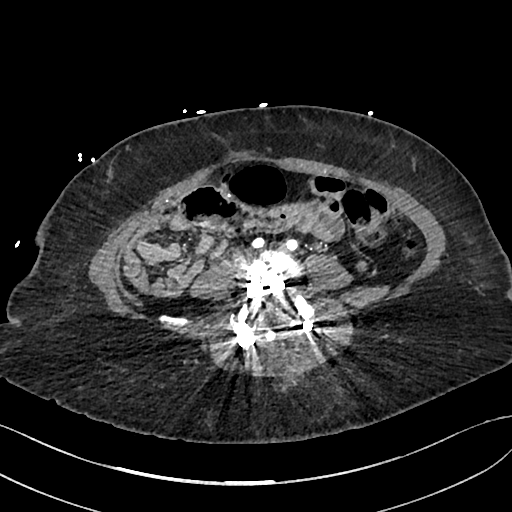
[im 121/282  soft-tissue]
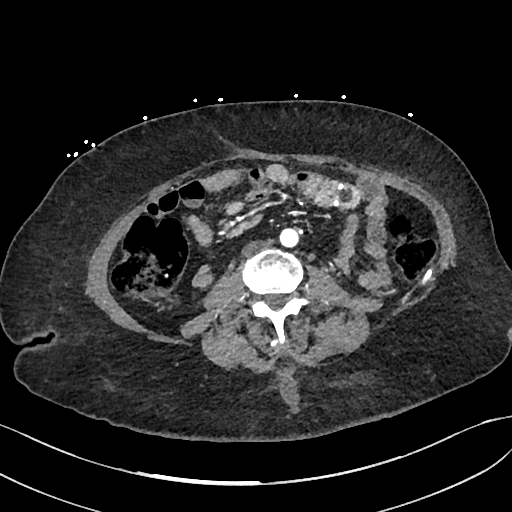
[im 141/282  soft-tissue]
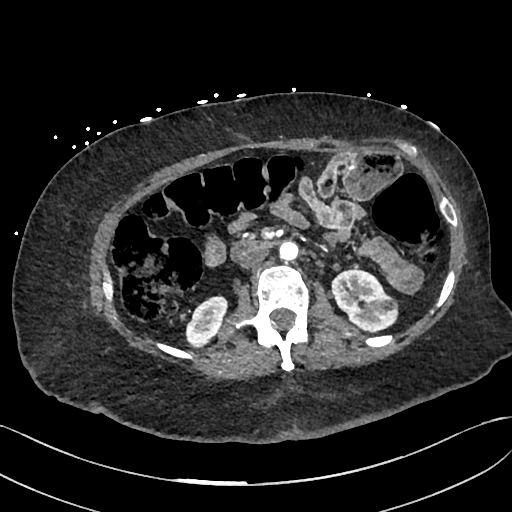
[im 161/282  soft-tissue]
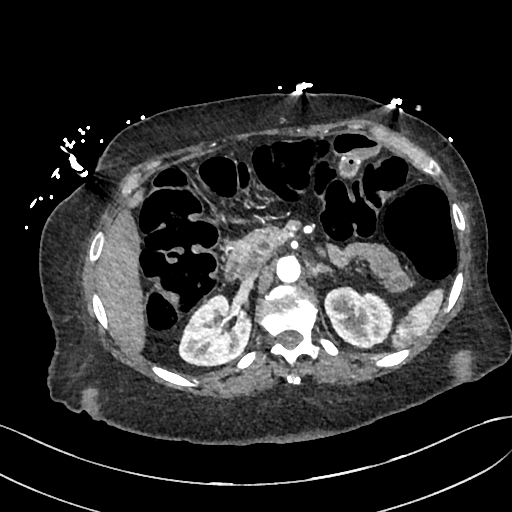
[im 181/282  soft-tissue]
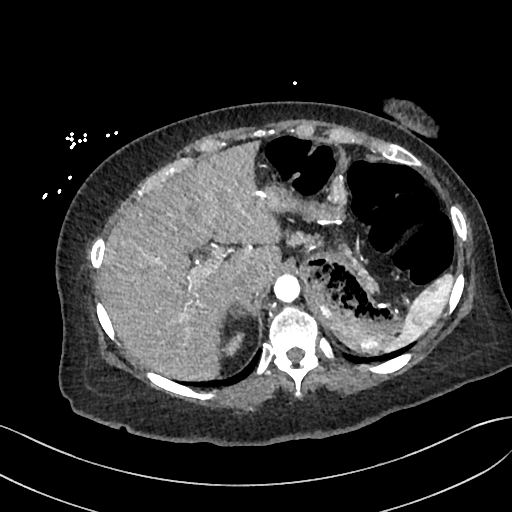
[im 221/282  soft-tissue]
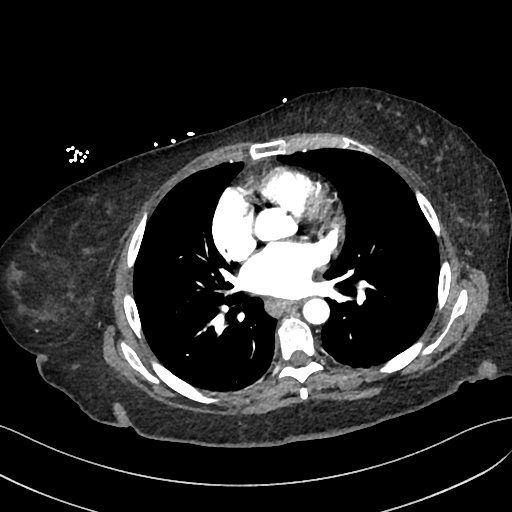
[im 221/282  bone]
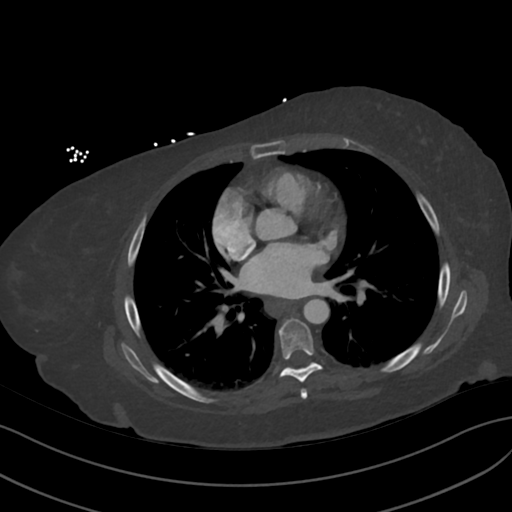
[im 241/282  soft-tissue]
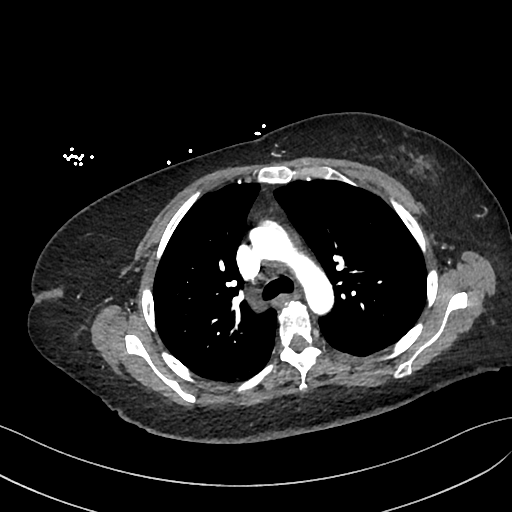
[im 261/282  soft-tissue]
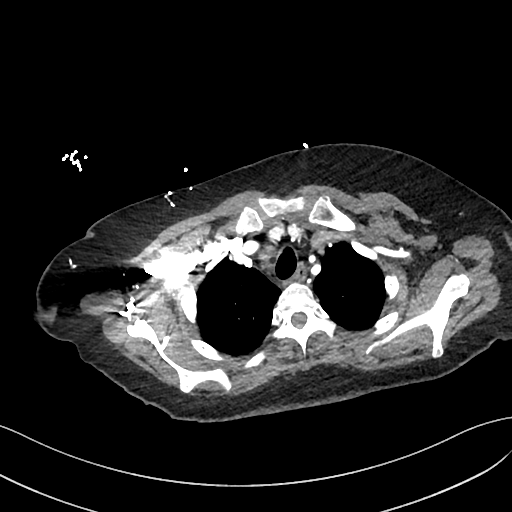

[Series 10: cor · coronal · 0.76mm/px · 3 of 151 slices shown]
[im 38/151  soft-tissue]
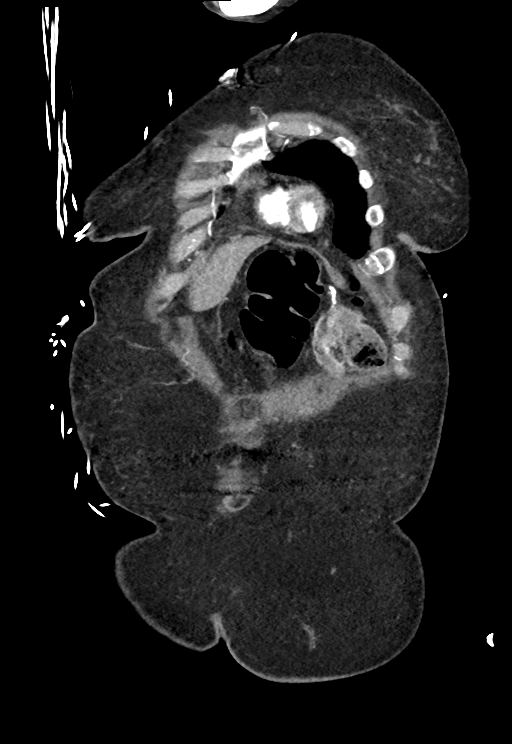
[im 76/151  soft-tissue]
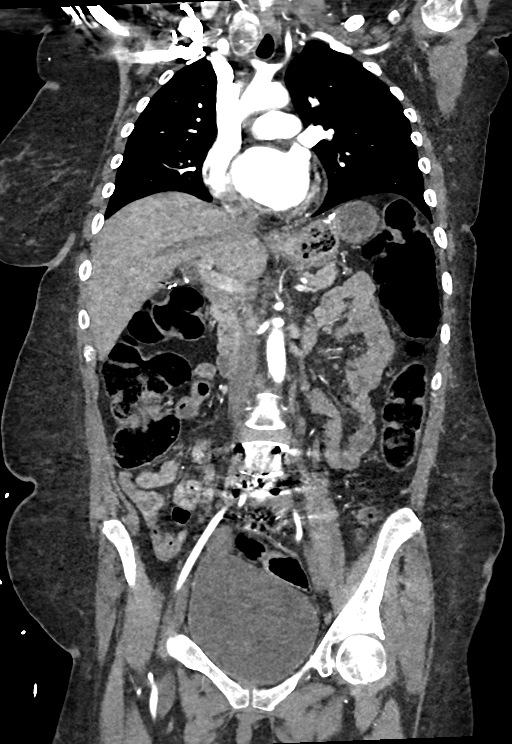
[im 113/151  soft-tissue]
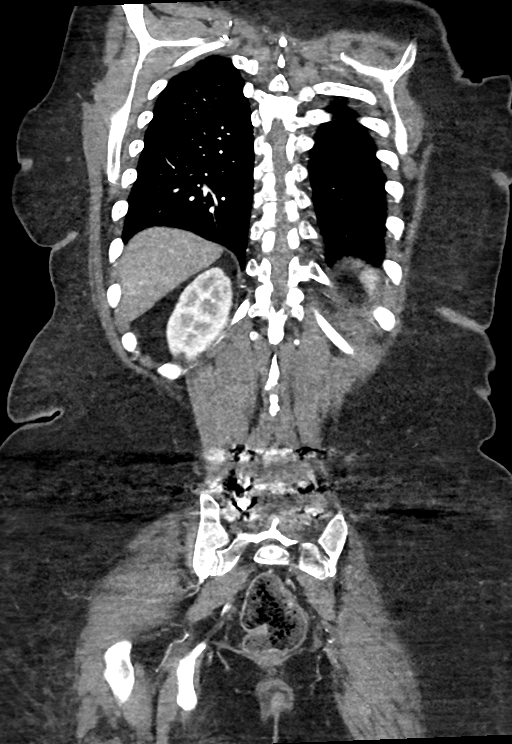

[14 of 46 positions shown; findings below may reference images not displayed]

FINDINGS: CT CHEST FINDINGS

Cardiovascular: Aorta is normal in caliber. No dissection. No
atherosclerosis. Pulmonary arteries relatively well opacified. There
is no evidence a central embolus. Pulmonary arteries are normal in
caliber. Heart is normal in size and configuration. No pericardial
effusion.

Mediastinum/Nodes: Several thyroid nodules, largest the posterior
right lobe measuring 2.2 cm, unchanged from the prior CT. No neck
base, axillary, mediastinal masses or enlarged lymph nodes. Trachea
and esophagus unremarkable.

Lungs/Pleura: Minor dependent linear atelectasis. Lungs otherwise
clear. No pleural effusion or pneumothorax.

Musculoskeletal: Mild degenerative changes of the thoracic spine. No
fracture or acute finding. No bone lesion. No chest wall mass.

CT ABDOMEN PELVIS FINDINGS

CTA findings

Aorta: Normal in caliber.  No dissection.  No atherosclerosis.

Celiac axis: Widely patent.

SMA: Widely patent.

Renal arteries: Widely patent.

IMA: Widely patent.

Iliac vessels: Common, external and internal iliac arteries are
widely patent.

Non CTA findings

Hepatobiliary: Liver normal in size. No mass focal lesion. Status
post cholecystectomy. No bile duct dilation.

Pancreas: Unremarkable. No pancreatic ductal dilatation or
surrounding inflammatory changes.

Spleen: Normal in size without focal abnormality.

Adrenals/Urinary Tract: No adrenal masses. Kidneys normal in overall
size orientation and position. Low-density renal masses are noted
consistent cysts, stable prior CT. No hydronephrosis. Normal
ureters. Normal bladder.

Stomach/Bowel: Stable changes from prior gastric bypass surgery. No
stomach wall thickening or inflammation.

Small bowel is normal in caliber. No wall thickening or
inflammation. Mild gaseous distention of the colon. No colonic wall
thickening. No inflammation.

Normal appendix visualized.

Lymphatic: No enlarged lymph nodes.

Reproductive: Status post hysterectomy. No adnexal masses.

Other: Midline anterior abdominal wall scar is stable. No hernia. No
ascites.

Musculoskeletal: L4 through S1 posterior lumbar spine fusion. This
is new since the prior CT. Orthopedic hardware appears well seated
and well-positioned.

No fracture or acute finding.  No bone lesion.
IMPRESSION: CTA FINDINGS

1. Normal caliber thoracoabdominal aorta. No dissection. No
atherosclerosis.
2. Aortic branch vessels are widely patent.  No stenosis.

CHEST CT

1. No acute findings.
2. 2.2 cm right thyroid nodule, present on the previous CT. This is
stable from older CTs as well. As noted on the prior CT, recommend
follow-up thyroid ultrasound if this has not been previously
performed.

ABDOMEN AND PELVIS CT

1. No acute findings.
2. Stable changes prior gastric bypass surgery, cholecystectomy and
hysterectomy.
3. New changes a posterior L4 through S1 lumbar spine fusion.

## 2020-08-27 ENCOUNTER — Inpatient Hospital Stay (HOSPITAL_COMMUNITY)
Admission: EM | Admit: 2020-08-27 | Discharge: 2020-08-30 | DRG: 917 | Disposition: A | Discharge status: Home Health | Payer: Medicare Other | Attending: Internal Medicine | Admitting: Internal Medicine

## 2020-08-27 ENCOUNTER — Emergency Department (HOSPITAL_COMMUNITY): Payer: Medicare Other

## 2020-08-27 ENCOUNTER — Other Ambulatory Visit: Payer: Self-pay

## 2020-08-27 ENCOUNTER — Encounter (HOSPITAL_COMMUNITY): Payer: Self-pay | Admitting: Emergency Medicine

## 2020-08-27 ENCOUNTER — Observation Stay (HOSPITAL_COMMUNITY): Payer: Medicare Other

## 2020-08-27 DIAGNOSIS — Z20822 Contact with and (suspected) exposure to covid-19: Secondary | ICD-10-CM | POA: Diagnosis present

## 2020-08-27 DIAGNOSIS — Z7982 Long term (current) use of aspirin: Secondary | ICD-10-CM

## 2020-08-27 DIAGNOSIS — M549 Dorsalgia, unspecified: Secondary | ICD-10-CM | POA: Diagnosis present

## 2020-08-27 DIAGNOSIS — G43909 Migraine, unspecified, not intractable, without status migrainosus: Secondary | ICD-10-CM | POA: Diagnosis present

## 2020-08-27 DIAGNOSIS — D649 Anemia, unspecified: Secondary | ICD-10-CM | POA: Diagnosis present

## 2020-08-27 DIAGNOSIS — M545 Low back pain, unspecified: Secondary | ICD-10-CM

## 2020-08-27 DIAGNOSIS — Z6828 Body mass index (BMI) 28.0-28.9, adult: Secondary | ICD-10-CM

## 2020-08-27 DIAGNOSIS — N179 Acute kidney failure, unspecified: Secondary | ICD-10-CM | POA: Diagnosis not present

## 2020-08-27 DIAGNOSIS — Z801 Family history of malignant neoplasm of trachea, bronchus and lung: Secondary | ICD-10-CM

## 2020-08-27 DIAGNOSIS — I10 Essential (primary) hypertension: Secondary | ICD-10-CM | POA: Diagnosis not present

## 2020-08-27 DIAGNOSIS — M1990 Unspecified osteoarthritis, unspecified site: Secondary | ICD-10-CM | POA: Diagnosis present

## 2020-08-27 DIAGNOSIS — Z8659 Personal history of other mental and behavioral disorders: Secondary | ICD-10-CM

## 2020-08-27 DIAGNOSIS — G928 Other toxic encephalopathy: Secondary | ICD-10-CM | POA: Diagnosis present

## 2020-08-27 DIAGNOSIS — E876 Hypokalemia: Secondary | ICD-10-CM | POA: Diagnosis present

## 2020-08-27 DIAGNOSIS — R339 Retention of urine, unspecified: Secondary | ICD-10-CM | POA: Diagnosis present

## 2020-08-27 DIAGNOSIS — E538 Deficiency of other specified B group vitamins: Secondary | ICD-10-CM | POA: Diagnosis present

## 2020-08-27 DIAGNOSIS — Z8673 Personal history of transient ischemic attack (TIA), and cerebral infarction without residual deficits: Secondary | ICD-10-CM

## 2020-08-27 DIAGNOSIS — E860 Dehydration: Secondary | ICD-10-CM | POA: Diagnosis present

## 2020-08-27 DIAGNOSIS — T40601A Poisoning by unspecified narcotics, accidental (unintentional), initial encounter: Secondary | ICD-10-CM | POA: Diagnosis not present

## 2020-08-27 DIAGNOSIS — F419 Anxiety disorder, unspecified: Secondary | ICD-10-CM | POA: Diagnosis present

## 2020-08-27 DIAGNOSIS — D72829 Elevated white blood cell count, unspecified: Secondary | ICD-10-CM | POA: Diagnosis present

## 2020-08-27 DIAGNOSIS — E039 Hypothyroidism, unspecified: Secondary | ICD-10-CM | POA: Diagnosis present

## 2020-08-27 DIAGNOSIS — Z886 Allergy status to analgesic agent status: Secondary | ICD-10-CM

## 2020-08-27 DIAGNOSIS — Z79899 Other long term (current) drug therapy: Secondary | ICD-10-CM

## 2020-08-27 DIAGNOSIS — F32A Depression, unspecified: Secondary | ICD-10-CM | POA: Diagnosis present

## 2020-08-27 DIAGNOSIS — M797 Fibromyalgia: Secondary | ICD-10-CM | POA: Diagnosis present

## 2020-08-27 DIAGNOSIS — R531 Weakness: Secondary | ICD-10-CM

## 2020-08-27 DIAGNOSIS — Z90710 Acquired absence of both cervix and uterus: Secondary | ICD-10-CM

## 2020-08-27 DIAGNOSIS — E559 Vitamin D deficiency, unspecified: Secondary | ICD-10-CM | POA: Diagnosis present

## 2020-08-27 DIAGNOSIS — Z800 Family history of malignant neoplasm of digestive organs: Secondary | ICD-10-CM

## 2020-08-27 DIAGNOSIS — G894 Chronic pain syndrome: Secondary | ICD-10-CM | POA: Diagnosis present

## 2020-08-27 DIAGNOSIS — Z833 Family history of diabetes mellitus: Secondary | ICD-10-CM

## 2020-08-27 DIAGNOSIS — I951 Orthostatic hypotension: Secondary | ICD-10-CM | POA: Diagnosis present

## 2020-08-27 DIAGNOSIS — K219 Gastro-esophageal reflux disease without esophagitis: Secondary | ICD-10-CM | POA: Diagnosis present

## 2020-08-27 DIAGNOSIS — Z9049 Acquired absence of other specified parts of digestive tract: Secondary | ICD-10-CM

## 2020-08-27 DIAGNOSIS — T502X5A Adverse effect of carbonic-anhydrase inhibitors, benzothiadiazides and other diuretics, initial encounter: Secondary | ICD-10-CM | POA: Diagnosis present

## 2020-08-27 DIAGNOSIS — R778 Other specified abnormalities of plasma proteins: Secondary | ICD-10-CM | POA: Diagnosis present

## 2020-08-27 DIAGNOSIS — R4182 Altered mental status, unspecified: Secondary | ICD-10-CM

## 2020-08-27 DIAGNOSIS — Z9884 Bariatric surgery status: Secondary | ICD-10-CM

## 2020-08-27 DIAGNOSIS — G8929 Other chronic pain: Secondary | ICD-10-CM

## 2020-08-27 DIAGNOSIS — E7800 Pure hypercholesterolemia, unspecified: Secondary | ICD-10-CM | POA: Diagnosis present

## 2020-08-27 DIAGNOSIS — E669 Obesity, unspecified: Secondary | ICD-10-CM | POA: Diagnosis present

## 2020-08-27 DIAGNOSIS — G9341 Metabolic encephalopathy: Secondary | ICD-10-CM | POA: Diagnosis present

## 2020-08-27 DIAGNOSIS — Z8249 Family history of ischemic heart disease and other diseases of the circulatory system: Secondary | ICD-10-CM

## 2020-08-27 DIAGNOSIS — E041 Nontoxic single thyroid nodule: Secondary | ICD-10-CM | POA: Diagnosis present

## 2020-08-27 DIAGNOSIS — R7989 Other specified abnormal findings of blood chemistry: Secondary | ICD-10-CM | POA: Diagnosis present

## 2020-08-27 DIAGNOSIS — R112 Nausea with vomiting, unspecified: Secondary | ICD-10-CM | POA: Diagnosis present

## 2020-08-27 LAB — CBC
HCT: 35.8 % — ABNORMAL LOW (ref 36.0–46.0)
Hemoglobin: 11.5 g/dL — ABNORMAL LOW (ref 12.0–15.0)
MCH: 29.5 pg (ref 26.0–34.0)
MCHC: 32.1 g/dL (ref 30.0–36.0)
MCV: 91.8 fL (ref 80.0–100.0)
Platelets: 234 10*3/uL (ref 150–400)
RBC: 3.9 MIL/uL (ref 3.87–5.11)
RDW: 17.1 % — ABNORMAL HIGH (ref 11.5–15.5)
WBC: 10.6 10*3/uL — ABNORMAL HIGH (ref 4.0–10.5)
nRBC: 0 % (ref 0.0–0.2)

## 2020-08-27 LAB — COMPREHENSIVE METABOLIC PANEL
ALT: 34 U/L (ref 0–44)
AST: 50 U/L — ABNORMAL HIGH (ref 15–41)
Albumin: 2.9 g/dL — ABNORMAL LOW (ref 3.5–5.0)
Alkaline Phosphatase: 87 U/L (ref 38–126)
Anion gap: 12 (ref 5–15)
BUN: 18 mg/dL (ref 6–20)
CO2: 30 mmol/L (ref 22–32)
Calcium: 8.5 mg/dL — ABNORMAL LOW (ref 8.9–10.3)
Chloride: 103 mmol/L (ref 98–111)
Creatinine, Ser: 1.53 mg/dL — ABNORMAL HIGH (ref 0.44–1.00)
GFR, Estimated: 39 mL/min — ABNORMAL LOW (ref >60)
Glucose, Bld: 121 mg/dL — ABNORMAL HIGH (ref 70–99)
Potassium: 3.3 mmol/L — ABNORMAL LOW (ref 3.5–5.1)
Sodium: 145 mmol/L (ref 135–145)
Total Bilirubin: 0.6 mg/dL (ref 0.3–1.2)
Total Protein: 5.7 g/dL — ABNORMAL LOW (ref 6.5–8.1)

## 2020-08-27 LAB — URINALYSIS, ROUTINE W REFLEX MICROSCOPIC
Bilirubin Urine: NEGATIVE
Glucose, UA: NEGATIVE mg/dL
Hgb urine dipstick: NEGATIVE
Ketones, ur: NEGATIVE mg/dL
Leukocytes,Ua: NEGATIVE
Nitrite: NEGATIVE
Protein, ur: NEGATIVE mg/dL
Specific Gravity, Urine: 1.011 (ref 1.005–1.030)
pH: 5 (ref 5.0–8.0)

## 2020-08-27 LAB — AMMONIA: Ammonia: 38 umol/L — ABNORMAL HIGH (ref 9–35)

## 2020-08-27 LAB — I-STAT BETA HCG BLOOD, ED (MC, WL, AP ONLY): I-stat hCG, quantitative: <5 m[IU]/mL (ref <5)

## 2020-08-27 LAB — RESP PANEL BY RT-PCR (FLU A&B, COVID) ARPGX2
Influenza A by PCR: NEGATIVE
Influenza B by PCR: NEGATIVE
SARS Coronavirus 2 by RT PCR: NEGATIVE

## 2020-08-27 LAB — TROPONIN I (HIGH SENSITIVITY)
Troponin I (High Sensitivity): 22 ng/L — ABNORMAL HIGH (ref <18)
Troponin I (High Sensitivity): 23 ng/L — ABNORMAL HIGH (ref <18)

## 2020-08-27 LAB — CBG MONITORING, ED
Glucose-Capillary: 59 mg/dL — ABNORMAL LOW (ref 70–99)
Glucose-Capillary: 116 mg/dL — ABNORMAL HIGH (ref 70–99)

## 2020-08-27 LAB — VITAMIN D 25 HYDROXY (VIT D DEFICIENCY, FRACTURES): Vit D, 25-Hydroxy: 80.74 ng/mL (ref 30–100)

## 2020-08-27 LAB — VITAMIN B12: Vitamin B-12: 431 pg/mL (ref 180–914)

## 2020-08-27 LAB — TSH: TSH: 0.909 u[IU]/mL (ref 0.350–4.500)

## 2020-08-27 MED ORDER — PREGABALIN 75 MG PO CAPS
75.0000 mg | ORAL_CAPSULE | Freq: Three times a day (TID) | ORAL | Status: DC
  Administered 2020-08-28 – 2020-08-30 (×8): 75 mg via ORAL
  Filled 2020-08-27 (×4): qty 1
  Filled 2020-08-27: qty 3
  Filled 2020-08-27 (×3): qty 1

## 2020-08-27 MED ORDER — HYDRALAZINE HCL 25 MG PO TABS
25.0000 mg | ORAL_TABLET | Freq: Three times a day (TID) | ORAL | Status: DC
  Administered 2020-08-28 (×2): 25 mg via ORAL
  Filled 2020-08-27 (×2): qty 1

## 2020-08-27 MED ORDER — METOPROLOL SUCCINATE ER 100 MG PO TB24
100.0000 mg | ORAL_TABLET | Freq: Every day | ORAL | Status: DC
  Administered 2020-08-28: 100 mg via ORAL
  Filled 2020-08-27 (×2): qty 1

## 2020-08-27 MED ORDER — ACETAMINOPHEN 325 MG PO TABS
325.0000 mg | ORAL_TABLET | Freq: Four times a day (QID) | ORAL | Status: DC | PRN
  Administered 2020-08-27 – 2020-08-29 (×4): 325 mg via ORAL
  Filled 2020-08-27 (×4): qty 1

## 2020-08-27 MED ORDER — FOLIC ACID 1 MG PO TABS
1.0000 mg | ORAL_TABLET | Freq: Every day | ORAL | Status: DC
  Administered 2020-08-28 – 2020-08-30 (×3): 1 mg via ORAL
  Filled 2020-08-27 (×4): qty 1

## 2020-08-27 MED ORDER — NITROGLYCERIN 0.4 MG SL SUBL: 0.4000 mg | SUBLINGUAL_TABLET | SUBLINGUAL | Status: DC | PRN

## 2020-08-27 MED ORDER — ACETAMINOPHEN 650 MG RE SUPP: 325.0000 mg | Freq: Four times a day (QID) | RECTAL | Status: DC | PRN

## 2020-08-27 MED ORDER — HYDROCHLOROTHIAZIDE 12.5 MG PO CAPS
12.5000 mg | ORAL_CAPSULE | Freq: Every day | ORAL | Status: DC
  Filled 2020-08-27: qty 1

## 2020-08-27 MED ORDER — ACETAMINOPHEN 500 MG PO TABS: 500.0000 mg | ORAL_TABLET | ORAL | Status: DC | PRN

## 2020-08-27 MED ORDER — ONDANSETRON HCL 4 MG/2ML IJ SOLN: 4.0000 mg | Freq: Four times a day (QID) | INTRAMUSCULAR | Status: DC | PRN

## 2020-08-27 MED ORDER — LOSARTAN POTASSIUM 50 MG PO TABS
100.0000 mg | ORAL_TABLET | Freq: Every day | ORAL | Status: DC
  Filled 2020-08-27: qty 2

## 2020-08-27 MED ORDER — AMLODIPINE BESYLATE 5 MG PO TABS
5.0000 mg | ORAL_TABLET | Freq: Every day | ORAL | Status: DC
  Administered 2020-08-27 – 2020-08-30 (×4): 5 mg via ORAL
  Filled 2020-08-27 (×4): qty 1

## 2020-08-27 MED ORDER — PANTOPRAZOLE SODIUM 40 MG PO TBEC
40.0000 mg | DELAYED_RELEASE_TABLET | Freq: Two times a day (BID) | ORAL | Status: DC
  Administered 2020-08-28 – 2020-08-30 (×5): 40 mg via ORAL
  Filled 2020-08-27 (×5): qty 1

## 2020-08-27 MED ORDER — ASPIRIN EC 81 MG PO TBEC
81.0000 mg | DELAYED_RELEASE_TABLET | Freq: Every day | ORAL | Status: DC
  Administered 2020-08-28 – 2020-08-30 (×3): 81 mg via ORAL
  Filled 2020-08-27 (×4): qty 1

## 2020-08-27 MED ORDER — POTASSIUM CHLORIDE CRYS ER 20 MEQ PO TBCR
20.0000 meq | EXTENDED_RELEASE_TABLET | Freq: Two times a day (BID) | ORAL | Status: DC
  Administered 2020-08-28 – 2020-08-30 (×6): 20 meq via ORAL
  Filled 2020-08-27 (×6): qty 1

## 2020-08-27 MED ORDER — HEPARIN SODIUM (PORCINE) 5000 UNIT/ML IJ SOLN
5000.0000 [IU] | Freq: Three times a day (TID) | INTRAMUSCULAR | Status: DC
  Administered 2020-08-28 – 2020-08-30 (×7): 5000 [IU] via SUBCUTANEOUS
  Filled 2020-08-27 (×7): qty 1

## 2020-08-27 MED ORDER — FERROUS SULFATE 325 (65 FE) MG PO TABS
325.0000 mg | ORAL_TABLET | Freq: Every day | ORAL | Status: DC
  Administered 2020-08-28 – 2020-08-30 (×3): 325 mg via ORAL
  Filled 2020-08-27 (×3): qty 1

## 2020-08-27 MED ORDER — ONDANSETRON 4 MG PO TBDP: 4.0000 mg | ORAL_TABLET | Freq: Three times a day (TID) | ORAL | Status: DC | PRN

## 2020-08-27 MED ORDER — LOSARTAN POTASSIUM-HCTZ 100-12.5 MG PO TABS: 1.0000 | ORAL_TABLET | Freq: Every day | ORAL | Status: DC

## 2020-08-27 MED ORDER — LACTATED RINGERS IV SOLN: INTRAVENOUS | Status: DC

## 2020-08-27 MED ORDER — VITAMIN D (ERGOCALCIFEROL) 1.25 MG (50000 UNIT) PO CAPS
50000.0000 [IU] | ORAL_CAPSULE | ORAL | Status: DC
  Administered 2020-08-28 – 2020-08-29 (×2): 50000 [IU] via ORAL
  Filled 2020-08-27 (×2): qty 1

## 2020-08-27 MED ORDER — ISOSORBIDE MONONITRATE ER 30 MG PO TB24
30.0000 mg | ORAL_TABLET | Freq: Every day | ORAL | Status: DC
  Administered 2020-08-27 – 2020-08-28 (×2): 30 mg via ORAL
  Filled 2020-08-27 (×2): qty 1

## 2020-08-27 MED ORDER — NALOXONE HCL 0.4 MG/ML IJ SOLN
INTRAMUSCULAR | Status: AC
  Filled 2020-08-27: qty 1

## 2020-08-27 MED ORDER — DULOXETINE HCL 60 MG PO CPEP
60.0000 mg | ORAL_CAPSULE | Freq: Every day | ORAL | Status: DC
  Administered 2020-08-28 – 2020-08-30 (×4): 60 mg via ORAL
  Filled 2020-08-27 (×6): qty 1

## 2020-08-27 MED ORDER — ALBUTEROL SULFATE HFA 108 (90 BASE) MCG/ACT IN AERS
1.0000 | INHALATION_SPRAY | Freq: Four times a day (QID) | RESPIRATORY_TRACT | Status: DC | PRN
  Filled 2020-08-27: qty 6.7

## 2020-08-27 MED ORDER — ESCITALOPRAM OXALATE 10 MG PO TABS
10.0000 mg | ORAL_TABLET | Freq: Every day | ORAL | Status: DC
  Administered 2020-08-28 – 2020-08-29 (×2): 10 mg via ORAL
  Filled 2020-08-27 (×2): qty 1

## 2020-08-27 MED ORDER — FLUTICASONE PROPIONATE 50 MCG/ACT NA SUSP
1.0000 | Freq: Every day | NASAL | Status: DC | PRN
  Filled 2020-08-27: qty 16

## 2020-08-27 MED ORDER — SODIUM CHLORIDE 0.9 % IV BOLUS
1000.0000 mL | Freq: Once | INTRAVENOUS | Status: AC
  Administered 2020-08-27: 1000 mL via INTRAVENOUS

## 2020-08-27 MED ORDER — ONDANSETRON HCL 4 MG PO TABS: 4.0000 mg | ORAL_TABLET | Freq: Four times a day (QID) | ORAL | Status: DC | PRN

## 2020-08-27 MED ORDER — DEXTROSE 50 % IV SOLN
INTRAVENOUS | Status: AC
  Filled 2020-08-27: qty 50

## 2020-08-27 MED ORDER — MIRTAZAPINE 15 MG PO TABS
7.5000 mg | ORAL_TABLET | Freq: Every day | ORAL | Status: DC
  Administered 2020-08-28 – 2020-08-29 (×3): 7.5 mg via ORAL
  Filled 2020-08-27 (×3): qty 1

## 2020-08-27 NOTE — ED Notes (Signed)
Dinner Tray Ordered @ 1842. 

## 2020-08-27 NOTE — H&P (Signed)
History and Physical   Jodi Bowman JXB:[id number] DOB: 08-19-1961 DOA: 08/27/2020  PCP: Aletha Halim., PA-C  Outpatient Specialists: [facility] neuropsychology-Dr. McDermott Patient coming from: Work via EMS  I have personally briefly reviewed patient's old medical records in [place].  Chief Concern: Altered mental status  HPI: Jodi Bowman is a 59 y.o. female with medical history significant for chronic pain syndrome, GERD, hypertension, history of urinary retention,  She was working and she felt weak and was about to fall, but her coworkers caught her. She denies lost of consciousness at work. She denies head trauma.   She endorses nausea and vomiting x 6-7 x at work.  She reports no blood in vomitus.  She endorses right parietal headache that is aching and hammer like. Lower abdominal pain x 2 months, decreased urges to urinate, denies dysuria, hematuria.  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 Denies history of tobacco use, current etoh use. Denies recreational drug use. She formerly drank wine cooler socially and infrequent and has now stopped completely.  ED Course: Discussed with ED provider, ED provider suspect opiate overdose, requesting admission after Narcan given with some improvement.  Review of Systems: As per HPI otherwise 10 point review of systems negative.   Assessment/Plan  Principal Problem:   Change in mental status Active Problems:   Essential hypertension   Backache   AKI (acute kidney injury) ([place])   Fibromyalgia   Chronic pain syndrome   Weakness   Mental status change resolved   Change in mental status with weakness-etiology unclear at this time, query metabolic encephalopathy  secondary to opioid overdose in setting of AKI, B12 deficiency, vitamin D deficiency, hypothyroid -CT of the head without contrast was read as unremarkable noncontrast CT of the head -Patient responded to [place] to observation with telemetry -Checking B12, vitamin D, TSH -CT abdomen pelvis without contrast  Leukocytosis-suspect reactive secondary to opioid overdose versus Narcan administration -UA was negative for leukocytes and nitrates -Chest x-ray was negative for acute cardiopulmonary process -Low clinical suspicion for infectious etiology at this time -CT abdomen and pelvis without contrast and pending read  AKI-query poor p.o. poor p.o. intake versus GI loss from vomiting -BMP in the a.m. -LR IVF at 150 cc/h for 1 day -Primary team to assess for need of further IVF   Nausea vomiting-query gastro enteritis, Zofran as needed -Symptomatic management  Urinary retention-suspect acute on chronic -Per chart review patient is to follow with uro-Gyn patient -Bladder scan was positive for urine retention -Foley catheter order placed -Strict I's and O's  Hypertension-resumed home amlodipine 5 mg daily, Imdur 30 mg daily, metoprolol succinate 100 mg daily -Holding home hydrochlorothiazide and losartan due to AKI  Anxiety/depression/fibromyalgia-resumed home duloxetine 60 mg daily, escitalopram 10 mg nightly, mirtazapine 7.5 mg daily nightly -Resumed home pregabalin 75 mg 3 times daily  Hypokalemia-secondary to hydrochlorothiazide use  Chart reviewed.   DVT prophylaxis: Heparin subcutaneous 3 times daily Code Status: Full code Diet: Heart healthy Family Communication: Sister at bedside and updated Disposition Plan: Pending clinical course Consults called: None at this time Admission status: Observation with telemetry  Past Medical History:  Diagnosis Date  . Arthritis   . Calculus of gallbladder without mention of cholecystitis or obstruction   . Dizziness   .  Fibromyalgia   . GERD (gastroesophageal reflux disease)   . Goiter   . Hypercholesteremia   . Hypertension   . Lower back pain   . Migraine   . Nontoxic uninodular goiter    sees dr vollmer at [facility]  . Obesity   . PONV (postoperative nausea and vomiting)   . Sleep apnea    STOPBANG=5  . Stroke [facility]) 2000   Past Surgical History:  Procedure Laterality Date  . ABDOMINAL HYSTERECTOMY    . BOWEL RESECTION N/A 03/06/2018   Procedure: SMALL BOWEL ANASTAMOSIS;  Surgeon: Clovis Riley, MD;  Location: [place];  Service: General;  Laterality: N/A;  . CATARACT EXTRACTION W/ INTRAOCULAR LENS IMPLANT Bilateral   . CESAREAN SECTION  yrs ago   done x 2  . CHOLECYSTECTOMY  01/05/2012   Procedure: LAPAROSCOPIC CHOLECYSTECTOMY WITH INTRAOPERATIVE CHOLANGIOGRAM;  Surgeon: Pedro Earls, MD;  Location: [facility];  Service: General;  Laterality: N/A;  . COLONOSCOPY  10/08/2012   Procedure: COLONOSCOPY;  Surgeon: Beryle Beams, MD;  Location: [facility];  Service: Endoscopy;  Laterality: N/A;  . KNEE ARTHROSCOPY  one 1995 and 1 in 1997   both knees done  . LAPAROSCOPY N/A 03/06/2018   Procedure: LAPAROSCOPY DIAGNOSTIC WITH LYSIS OF ADHESIONS;  Surgeon: Clovis Riley, MD;  Location: [place];  Service: General;  Laterality: N/A;  . LAPAROTOMY N/A 03/06/2018   Procedure: EXPLORATORY LAPAROTOMY, RESECTION OF DISTAL ROUX, CLOSURE OF INTERNAL HERNIA;  Surgeon: Clovis Riley, MD;  Location: [place];  Service: General;  Laterality: N/A;  . LEFT HEART CATH AND CORONARY ANGIOGRAPHY N/A 08/29/2019   Procedure: LEFT HEART CATH AND CORONARY ANGIOGRAPHY;  Surgeon: Charolette Forward, MD;  Location: [place] [facility];  Service: Cardiovascular;  Laterality: N/A;  . LUMBAR WOUND DEBRIDEMENT N/A 04/03/2019   Procedure: LUMBAR WOUND WASHOUT;  Surgeon: Judith Part, MD;  Location: [place];  Service: Neurosurgery;  Laterality: N/A;  . POSTERIOR LUMBAR FUSION  03/04/2019  . surgery for endometriosis  yrs ago  .  thryoid biopsy  December 01, 2011    at [facility]   Social History:  reports that she has never smoked. She has never used smokeless tobacco. She reports previous alcohol use. She reports that she does not use drugs.  Allergies  Allergen Reactions  . Shrimp [Shellfish Allergy] Anaphylaxis  . Naproxen Other (See Comments)    Makes stomach cramp and burn badly   Family History  Problem Relation Age of Onset  . Diabetes Mother   . Hypertension Mother   . Transient ischemic attack Mother   . Seizures Mother   . Dementia Mother   . Other Father        MVA  . Cancer Brother        colon and lung  . Cancer Maternal Grandmother        colon   Family history: Family history reviewed and not pertinent  Prior to Admission medications   Medication Sig Start Date End Date Taking? Authorizing Provider  albuterol (PROVENTIL HFA) 108 (90 Base) MCG/ACT inhaler Inhale 1-2 puffs into the lungs every 6 (six) hours as needed for wheezing or shortness of breath.   Yes [provider]  amLODipine (NORVASC) 5 MG tablet Take 5 mg by mouth daily. 01/14/20  Yes [provider]  aspirin EC 81 MG tablet Take 81 mg by mouth daily.   Yes [provider]  D3-50 1.25 MG (50000 UT) capsule Take 50,000 Units by  mouth 3 (three) times a week. 05/06/20  Yes [provider]  diclofenac Sodium (VOLTAREN) 1 % GEL Apply 4 g topically 2 (two) times daily as needed (back pain).   Yes [provider]  DULoxetine (CYMBALTA) 60 MG capsule Take 60 mg by mouth daily. 08/11/19  Yes [provider]  escitalopram (LEXAPRO) 10 MG tablet Take 10 mg by mouth at bedtime. 05/04/20  Yes [provider]  ferrous sulfate 325 (65 FE) MG EC tablet Take 325 mg by mouth daily. 07/26/19  Yes [provider]  fluticasone (FLONASE) 50 MCG/ACT nasal spray Place 1 spray into both nostrils daily as needed for allergies.  11/08/17  Yes [provider]  folic acid (FOLVITE) 1 MG tablet  Take 1 mg by mouth daily. 10/14/17  Yes [provider]  HYDROcodone-acetaminophen (NORCO) 10-325 MG tablet Take 1 tablet by mouth 5 (five) times daily as needed for moderate pain or severe pain.  02/16/20  Yes [provider]  isosorbide mononitrate (IMDUR) 30 MG 24 hr tablet Take 1 tablet (30 mg total) by mouth daily. 11/18/19  Yes Corena Herter, PA-C  loratadine (CLARITIN) 10 MG tablet Take 10 mg by mouth daily.    Yes [provider]  losartan-hydrochlorothiazide (HYZAAR) 100-12.5 MG tablet Take 1 tablet by mouth daily. 01/13/20  Yes [provider]  metoprolol succinate (TOPROL-XL) 100 MG 24 hr tablet Take 100 mg by mouth at bedtime.  07/10/19  Yes [provider]  mirtazapine (REMERON) 7.5 MG tablet Take 7.5 mg by mouth at bedtime. 07/30/20  Yes [provider]  morphine (MS CONTIN) 30 MG 12 hr tablet Take 30 mg by mouth every 12 (twelve) hours. 06/14/20  Yes [provider]  nitroGLYCERIN (NITROSTAT) 0.4 MG SL tablet Place 1 tablet (0.4 mg total) under the tongue every 5 (five) minutes x 3 doses as needed for chest pain. Patient taking differently: Place 0.4 mg under the tongue every 5 (five) minutes as needed for chest pain (MAXIMUM OF 3 DOSES).  03/13/14  Yes Charolette Forward, MD  ondansetron (ZOFRAN-ODT) 4 MG disintegrating tablet Take 4 mg by mouth every 8 (eight) hours as needed for nausea/vomiting. 06/07/20  Yes [provider]  pantoprazole (PROTONIX) 40 MG tablet Take 1 tablet (40 mg total) by mouth 2 (two) times daily. 03/15/19  Yes Angiulli, Lavon Paganini, PA-C  potassium chloride SA (KLOR-CON) 20 MEQ tablet Take 1 tablet (20 mEq total) by mouth 2 (two) times daily. 09/28/19  Yes Carlisle Cater, PA-C  pregabalin (LYRICA) 75 MG capsule Take 1 capsule (75 mg total) by mouth 3 (three) times daily. 03/15/19  Yes Angiulli, Lavon Paganini, PA-C  Magnesium Oxide 400 MG CAPS Take 1 capsule (400 mg total) by mouth daily. Patient not taking:  Reported on 08/27/2020 09/28/19   Carlisle Cater, PA-C  polyethylene glycol (MIRALAX / GLYCOLAX) 17 g packet Take 17 g by mouth daily as needed for mild constipation. Patient not taking: Reported on 08/27/2020 03/08/19   Viona Gilmore D, NP  metoprolol (LOPRESSOR) 50 MG tablet Take 50 mg by mouth 2 (two) times daily.    12/04/11  [provider]  simvastatin (ZOCOR) 20 MG tablet Take 20 mg by mouth at bedtime.    12/04/11  [provider]   Physical Exam: Vitals:   08/27/20 1830 08/27/20 1845 08/27/20 1900 08/27/20 1915  BP: 130/73 (!) 165/137 (!) 166/84 (!) 151/82  Pulse: 72 78 71 71  Resp: 17 14 10 15   SpO2:  100% 97% 100% 100%  Weight:      Height:       Constitutional: appears older than chronological age, NAD, calm, comfortable Eyes: PERRL, lids and conjunctivae normal ENMT: Mucous membranes are moist. Posterior pharynx clear of any exudate or lesions. Age-appropriate dentition. Hearing appropriate Neck: normal, supple, no masses, no thyromegaly Respiratory: clear to auscultation bilaterally, no wheezing, no crackles. Normal respiratory effort. No accessory muscle use.  Cardiovascular: Regular rate and rhythm, no murmurs / rubs / gallops. 2+ pitting edema bilateral lower. 2+ pedal pulses. No carotid bruits.  Abdomen: no tenderness, no masses palpated, no hepatosplenomegaly. Bowel sounds positive.  Musculoskeletal: no clubbing / cyanosis. No joint deformity upper and lower extremities. Good ROM, no contractures, no atrophy. Normal muscle tone.  Skin: no rashes, lesions, ulcers. No induration Neurologic: Sensation intact. Strength 5/5 in all 4.  Psychiatric: Normal judgment and insight. Alert and oriented x 3. Normal mood.   EKG: Independently reviewed, showing normal sinus rhythm with a rate of 67, QTC 500  Chest x-ray on Admission: Personally reviewed and I agree with radiologist reading as below.  CT Head Wo Contrast  Result Date: 08/27/2020 CLINICAL DATA:   59 year old female with delirium. EXAM: CT HEAD WITHOUT CONTRAST TECHNIQUE: Contiguous axial images were obtained from the base of the skull through the vertex without intravenous contrast. COMPARISON:  Head CT dated 06/29/2020. FINDINGS: Brain: The ventricles and sulci appropriate size for patient's age. The gray-white matter discrimination is preserved. There is no acute intracranial hemorrhage. No mass effect or midline shift. No extra-axial fluid collection. Vascular: No hyperdense vessel or unexpected calcification. Skull: Normal. Negative for fracture or focal lesion. Sinuses/Orbits: Mild mucoperiosteal thickening of the left maxillary sinus. The remainder of the visualized paranasal sinuses and mastoid air cells are clear. Other: None IMPRESSION: Unremarkable noncontrast CT of the brain. Electronically Signed   By: Anner Crete M.D.   On: 08/27/2020 16:57   DG Chest Portable 1 View  Result Date: 08/27/2020 CLINICAL DATA:  Altered mental status. EXAM: PORTABLE CHEST 1 VIEW COMPARISON:  05/03/2020 FINDINGS: 1400 hours. Low volume film. The lungs are clear without focal pneumonia, edema, pneumothorax or pleural effusion. Interstitial markings are diffusely coarsened with chronic features. The cardiopericardial silhouette is within normal limits for size. The visualized bony structures of the thorax show no acute abnormality. Telemetry leads overlie the chest. IMPRESSION: Chronic interstitial changes without acute cardiopulmonary findings. Electronically Signed   By: Misty Stanley M.D.   On: 08/27/2020 14:08   Labs on Admission: I have personally reviewed following labs  CBC: Recent Labs  Lab 08/27/20 1250  WBC 10.6*  HGB 11.5*  HCT 35.8*  MCV 91.8  PLT 242   Basic Metabolic Panel: Recent Labs  Lab 08/27/20 1250  NA 145  K 3.3*  CL 103  CO2 30  GLUCOSE 121*  BUN 18  CREATININE 1.53*  CALCIUM 8.5*   GFR: Estimated Creatinine Clearance: 31.8 mL/min (A) (by C-G formula based on  SCr of 1.53 mg/dL (H)). Liver Function Tests: Recent Labs  Lab 08/27/20 1250  AST 50*  ALT 34  ALKPHOS 87  BILITOT 0.6  PROT 5.7*  ALBUMIN 2.9*   CBG: Recent Labs  Lab 08/27/20 1252 08/27/20 1515  GLUCAP 59* 116*   Urine analysis:    Component Value Date/Time   COLORURINE YELLOW 08/27/2020 [place] 08/27/2020 1835   LABSPEC 1.011 08/27/2020 1835   PHURINE 5.0 08/27/2020 [place] 08/27/2020 1835  HGBUR NEGATIVE 08/27/2020 1835   HGBUR negative 03/01/2009 0853   BILIRUBINUR NEGATIVE 08/27/2020 [place] 08/27/2020 [place] 08/27/2020 1835   UROBILINOGEN 1.0 10/09/2014 2018   NITRITE NEGATIVE 08/27/2020 1835   LEUKOCYTESUR NEGATIVE 08/27/2020 1835   Taraji Mungo N Rital Cavey D.O. [facility]  If 12AM-7AM, please contact overnight-coverage provider If 7AM-7PM, please contact day coverage provider [email or web address]  08/27/2020, 7:59 PM
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
## 2020-08-27 NOTE — ED Triage Notes (Signed)
Pt brought to ED by husband from work for [employer]. Per husband last seen well today at 5:30 AM, when pt woke up to work, per husband pt was a litter drowsy then and took her daughter to school and when to work. On triage pt is mostly unresponsive, only responding to her name when we call her name, no able tom move her arms, her legs and no responding to any question, cbg 59, IV started and D50 given on triage. ED provider assessing pt on triage.

## 2020-08-28 ENCOUNTER — Observation Stay (HOSPITAL_COMMUNITY): Payer: Medicare Other

## 2020-08-28 DIAGNOSIS — R4182 Altered mental status, unspecified: Secondary | ICD-10-CM

## 2020-08-28 DIAGNOSIS — E860 Dehydration: Secondary | ICD-10-CM | POA: Diagnosis present

## 2020-08-28 DIAGNOSIS — E538 Deficiency of other specified B group vitamins: Secondary | ICD-10-CM | POA: Diagnosis present

## 2020-08-28 DIAGNOSIS — T502X5A Adverse effect of carbonic-anhydrase inhibitors, benzothiadiazides and other diuretics, initial encounter: Secondary | ICD-10-CM | POA: Diagnosis present

## 2020-08-28 DIAGNOSIS — K219 Gastro-esophageal reflux disease without esophagitis: Secondary | ICD-10-CM | POA: Diagnosis present

## 2020-08-28 DIAGNOSIS — Z20822 Contact with and (suspected) exposure to covid-19: Secondary | ICD-10-CM | POA: Diagnosis present

## 2020-08-28 DIAGNOSIS — G894 Chronic pain syndrome: Secondary | ICD-10-CM | POA: Diagnosis present

## 2020-08-28 DIAGNOSIS — E876 Hypokalemia: Secondary | ICD-10-CM | POA: Diagnosis present

## 2020-08-28 DIAGNOSIS — N179 Acute kidney failure, unspecified: Secondary | ICD-10-CM | POA: Diagnosis present

## 2020-08-28 DIAGNOSIS — D72829 Elevated white blood cell count, unspecified: Secondary | ICD-10-CM | POA: Diagnosis present

## 2020-08-28 DIAGNOSIS — E039 Hypothyroidism, unspecified: Secondary | ICD-10-CM | POA: Diagnosis present

## 2020-08-28 DIAGNOSIS — G43909 Migraine, unspecified, not intractable, without status migrainosus: Secondary | ICD-10-CM | POA: Diagnosis present

## 2020-08-28 DIAGNOSIS — I951 Orthostatic hypotension: Secondary | ICD-10-CM | POA: Diagnosis present

## 2020-08-28 DIAGNOSIS — E559 Vitamin D deficiency, unspecified: Secondary | ICD-10-CM | POA: Diagnosis present

## 2020-08-28 DIAGNOSIS — T40601A Poisoning by unspecified narcotics, accidental (unintentional), initial encounter: Secondary | ICD-10-CM | POA: Diagnosis present

## 2020-08-28 DIAGNOSIS — G928 Other toxic encephalopathy: Secondary | ICD-10-CM | POA: Diagnosis present

## 2020-08-28 DIAGNOSIS — D649 Anemia, unspecified: Secondary | ICD-10-CM | POA: Diagnosis present

## 2020-08-28 DIAGNOSIS — F32A Depression, unspecified: Secondary | ICD-10-CM | POA: Diagnosis present

## 2020-08-28 DIAGNOSIS — I10 Essential (primary) hypertension: Secondary | ICD-10-CM | POA: Diagnosis present

## 2020-08-28 DIAGNOSIS — R7989 Other specified abnormal findings of blood chemistry: Secondary | ICD-10-CM | POA: Diagnosis present

## 2020-08-28 DIAGNOSIS — F419 Anxiety disorder, unspecified: Secondary | ICD-10-CM | POA: Diagnosis present

## 2020-08-28 DIAGNOSIS — R112 Nausea with vomiting, unspecified: Secondary | ICD-10-CM | POA: Diagnosis present

## 2020-08-28 DIAGNOSIS — R778 Other specified abnormalities of plasma proteins: Secondary | ICD-10-CM | POA: Diagnosis present

## 2020-08-28 DIAGNOSIS — Z9884 Bariatric surgery status: Secondary | ICD-10-CM | POA: Diagnosis not present

## 2020-08-28 DIAGNOSIS — M797 Fibromyalgia: Secondary | ICD-10-CM | POA: Diagnosis present

## 2020-08-28 LAB — CBC
HCT: 29.3 % — ABNORMAL LOW (ref 36.0–46.0)
Hemoglobin: 9.5 g/dL — ABNORMAL LOW (ref 12.0–15.0)
MCH: 29.2 pg (ref 26.0–34.0)
MCHC: 32.4 g/dL (ref 30.0–36.0)
MCV: 90.2 fL (ref 80.0–100.0)
Platelets: 203 10*3/uL (ref 150–400)
RBC: 3.25 MIL/uL — ABNORMAL LOW (ref 3.87–5.11)
RDW: 16.4 % — ABNORMAL HIGH (ref 11.5–15.5)
WBC: 7.7 10*3/uL (ref 4.0–10.5)
nRBC: 0 % (ref 0.0–0.2)

## 2020-08-28 LAB — D-DIMER, QUANTITATIVE: D-Dimer, Quant: 0.78 ug/mL-FEU — ABNORMAL HIGH (ref 0.00–0.50)

## 2020-08-28 LAB — BASIC METABOLIC PANEL
Anion gap: 6 (ref 5–15)
BUN: 13 mg/dL (ref 6–20)
CO2: 33 mmol/L — ABNORMAL HIGH (ref 22–32)
Calcium: 7.9 mg/dL — ABNORMAL LOW (ref 8.9–10.3)
Chloride: 102 mmol/L (ref 98–111)
Creatinine, Ser: 1.01 mg/dL — ABNORMAL HIGH (ref 0.44–1.00)
GFR, Estimated: >60 mL/min (ref >60)
Glucose, Bld: 62 mg/dL — ABNORMAL LOW (ref 70–99)
Potassium: 3 mmol/L — ABNORMAL LOW (ref 3.5–5.1)
Sodium: 141 mmol/L (ref 135–145)

## 2020-08-28 MED ORDER — CHLORHEXIDINE GLUCONATE CLOTH 2 % EX PADS
6.0000 | MEDICATED_PAD | Freq: Every day | CUTANEOUS | Status: DC
  Administered 2020-08-28: 6 via TOPICAL

## 2020-08-28 MED ORDER — POTASSIUM CHLORIDE CRYS ER 20 MEQ PO TBCR
40.0000 meq | EXTENDED_RELEASE_TABLET | Freq: Once | ORAL | Status: AC
  Administered 2020-08-28: 40 meq via ORAL
  Filled 2020-08-28: qty 2

## 2020-08-28 MED ORDER — POLYETHYLENE GLYCOL 3350 17 G PO PACK
17.0000 g | PACK | Freq: Every day | ORAL | Status: DC
  Administered 2020-08-29 – 2020-08-30 (×2): 17 g via ORAL
  Filled 2020-08-28 (×2): qty 1

## 2020-08-28 MED ORDER — IOHEXOL 350 MG/ML SOLN
75.0000 mL | Freq: Once | INTRAVENOUS | Status: AC | PRN
  Administered 2020-08-28: 75 mL via INTRAVENOUS

## 2020-08-28 MED ORDER — LACTATED RINGERS IV SOLN: INTRAVENOUS | Status: DC

## 2020-08-28 NOTE — NC FL2 (Signed)
[place] LEVEL OF CARE SCREENING TOOL     IDENTIFICATION  Patient Name: Jodi Bowman Birthdate: 1961/04/20 Sex: female Admission Date (Current Location): 08/27/2020  [facility] and [place] Number:  Herbalist and Address:  [facility], [place] [address], [place], [place] [address]      Provider Number: [id number]  Attending Physician Name and Address:  Edwin Dada, *  Relative Name and Phone Number:       Current Level of Care: Hospital Recommended Level of Care: [place] Prior Approval Number:    Date Approved/Denied:   PASRR Number: under review  Discharge Plan: SNF    Current Diagnoses: Patient Active Problem List   Diagnosis Date Noted  . Weakness 08/27/2020  . Change in mental status 08/27/2020  . Mental status change resolved 08/27/2020  . Weakness of left side of body 06/29/2020  . ACS (acute coronary syndrome) ([place]) 08/25/2019  . Chest pain 08/25/2019  . Postoperative seroma involving nervous system after nervous system procedure 04/05/2019  . Drainage from wound 04/02/2019  . Infective otitis externa of left ear   . Left otitis media   . AKI (acute kidney injury) ([place])   . Urinary retention   . Orthostasis   . Acute blood loss anemia   . Fibromyalgia   . Chronic pain syndrome   . Lumbar radiculopathy 03/08/2019  . Orthostatic hypotension 03/06/2019  . Postoperative urinary retention 03/06/2019  . Lumbar foraminal stenosis 03/04/2019  . S/P exploratory laparotomy 03/06/2018  . Bowel obstruction ([place]) 03/06/2018  . Chest pain, rule out acute myocardial infarction 12/17/2017  . Hypokalemia 12/17/2017  . Acute lower UTI 12/17/2017  . Vertigo 04/30/2017  . Small vessel disease, cerebrovascular 04/30/2017  . Chronic low back pain 08/01/2015  . Right hip pain 08/01/2015  . Abnormality of gait 08/01/2015  . Left leg weakness   . Low back pain with radiation   . Syncope  07/30/2014  . Atypical chest pain 03/12/2014  . Lap chole [facility] April 2013 01/29/2012  . Gallstones 12/04/2011  . Thyroid nodule-non neoplastic goiter by needle aspiration 12/04/2011  . GLUCOSE INTOLERANCE 03/12/2010  . DYSLIPIDEMIA 03/12/2010  . Chronic migraine 03/12/2010  . Carotid stenosis 03/12/2010  . CEREBROVASCULAR ACCIDENT 03/12/2010  . LIPOMA 01/29/2010  . HEADACHE 01/29/2010  . ANKLE INJURY, RIGHT 04/12/2009  . PHARYNGITIS 03/21/2009  . Backache 03/01/2009  . ALLERGIC RHINITIS 03/17/2007  . LOW BACK PAIN 03/17/2007  . Essential hypertension 01/01/2007  . ANXIETY STATE NOS 09/11/2005    Orientation RESPIRATION BLADDER Height & Weight     Self, Time, Situation, Place  Normal Continent Weight: 64 kg Height:  4\' 11"  (149.9 cm)  BEHAVIORAL SYMPTOMS/MOOD NEUROLOGICAL BOWEL NUTRITION STATUS      Continent Diet (heart healthy with thin liquids)  AMBULATORY STATUS COMMUNICATION OF NEEDS Skin   Limited Assist Verbally Normal                       Personal Care Assistance Level of Assistance  Bathing, Feeding, Dressing Bathing Assistance: Limited assistance Feeding assistance: Independent Dressing Assistance: Limited assistance     Functional Limitations Info  Sight, Hearing, Speech Sight Info: Adequate Hearing Info: Adequate Speech Info: Adequate    SPECIAL CARE FACTORS FREQUENCY  PT (By licensed PT), OT (By licensed OT)     PT Frequency: 5x/wk OT Frequency: 5x/wk            Contractures Contractures Info:  Not present    Additional Factors Info  Code Status, Allergies, Psychotropic Code Status Info: Full Allergies Info: shrimp/ naproxen Psychotropic Info: Cymbalta Dr 60 mg daily/ Lexapro 10 mg at bedtime/ remeron 7.5 mg at bedtime/ Lyrica 75 mg three times a day         Current Medications (08/28/2020):  This is the current hospital active medication list Current Facility-Administered Medications  Medication Dose Route Frequency Provider Last  Rate Last Admin  . acetaminophen (TYLENOL) tablet 325 mg  325 mg Oral Q6H PRN Cox, Amy N, DO   325 mg at 08/28/20 1058   Or  . acetaminophen (TYLENOL) suppository 325 mg  325 mg Rectal Q6H PRN Cox, Amy N, DO      . albuterol (VENTOLIN HFA) 108 (90 Base) MCG/ACT inhaler 1-2 puff  1-2 puff Inhalation Q6H PRN Cox, Amy N, DO      . amLODipine (NORVASC) tablet 5 mg  5 mg Oral Daily Cox, Amy N, DO   5 mg at 08/28/20 1058  . aspirin EC tablet 81 mg  81 mg Oral Daily Cox, Amy N, DO   81 mg at 08/28/20 1058  . DULoxetine (CYMBALTA) DR capsule 60 mg  60 mg Oral Daily Cox, Amy N, DO   60 mg at 08/28/20 1058  . escitalopram (LEXAPRO) tablet 10 mg  10 mg Oral QHS Cox, Amy N, DO      . ferrous sulfate tablet 325 mg  325 mg Oral Daily Cox, Amy N, DO   325 mg at 08/28/20 1058  . fluticasone (FLONASE) 50 MCG/ACT nasal spray 1 spray  1 spray Each Nare Daily PRN Cox, Amy N, DO      . folic acid (FOLVITE) tablet 1 mg  1 mg Oral Daily Cox, Amy N, DO   1 mg at 08/28/20 1057  . heparin injection 5,000 Units  5,000 Units Subcutaneous Q8H Cox, Amy N, DO   5,000 Units at 08/28/20 7169  . hydrALAZINE (APRESOLINE) tablet 25 mg  25 mg Oral Q8H Cox, Amy N, DO   25 mg at 08/28/20 6789  . isosorbide mononitrate (IMDUR) 24 hr tablet 30 mg  30 mg Oral Daily Cox, Amy N, DO   30 mg at 08/28/20 1058  . lactated ringers infusion   Intravenous Continuous Cox, Amy N, DO 150 mL/hr at 08/28/20 0540 Rate Verify at 08/28/20 0540  . metoprolol succinate (TOPROL-XL) 24 hr tablet 100 mg  100 mg Oral QHS Cox, Amy N, DO      . mirtazapine (REMERON) tablet 7.5 mg  7.5 mg Oral QHS Cox, Amy N, DO   7.5 mg at 08/28/20 0222  . nitroGLYCERIN (NITROSTAT) SL tablet 0.4 mg  0.4 mg Sublingual Q5 min PRN Cox, Amy N, DO      . ondansetron (ZOFRAN) tablet 4 mg  4 mg Oral Q6H PRN Cox, Amy N, DO       Or  . ondansetron (ZOFRAN) injection 4 mg  4 mg Intravenous Q6H PRN Cox, Amy N, DO      . pantoprazole (PROTONIX) EC tablet 40 mg  40 mg Oral BID Cox, Amy N,  DO   40 mg at 08/28/20 1058  . potassium chloride SA (KLOR-CON) CR tablet 20 mEq  20 mEq Oral BID Cox, Amy N, DO   20 mEq at 08/28/20 1057  . potassium chloride SA (KLOR-CON) CR tablet 40 mEq  40 mEq Oral Once Danford, Suann Larry, MD      . pregabalin (  LYRICA) capsule 75 mg  75 mg Oral TID Cox, Amy N, DO   75 mg at 08/28/20 1057  . Vitamin D (Ergocalciferol) (DRISDOL) capsule 50,000 Units  50,000 Units Oral Once per day on Mon Wed Fri Cox, Amy N, DO   50,000 Units at 08/28/20 0222     Discharge Medications: Please see discharge summary for a list of discharge medications.  Relevant Imaging Results:  Relevant Lab Results:   Additional Information SS#: [id number]  Pollie Friar, RN

## 2020-08-28 NOTE — TOC Initial Note (Signed)
Transition of Care [facility]) - Initial/Assessment Note    Patient Details  Name: Jodi Bowman MRN: [id number] Date of Birth: 1961/03/28  [facility] CM/SW Contact:    Pollie Friar, RN Phone Number: 08/28/2020, 12:00 PM  Clinical Narrative:                 Recommendations are for SNF rehab. CM met with the patient and she is agreeable. She asked to be faxed out in the [facility] area.  PASAR to manual review and information sent to [place] must.  TOC following.  Expected Discharge Plan: Skilled Nursing Facility Barriers to Discharge: Continued Medical Work up, SNF Pending bed offer   Patient Goals and CMS Choice   CMS Medicare.gov Compare Post Acute Care list provided to:: Patient Choice offered to / list presented to : Patient  Expected Discharge Plan and Services Expected Discharge Plan: [place] In-house Referral: Clinical Social Work Discharge Planning Services: CM Consult Post Acute Care Choice: [place] arrangements for the past 2 months: [place]                                      Prior Living Arrangements/Services Living arrangements for the past 2 months: Single Family Home Lives with:: Spouse Patient language and need for interpreter reviewed:: Yes Do you feel safe going back to the place where you live?: Yes      Need for Family Participation in Patient Care: Yes (Comment) Care giver support system in place?: Yes (comment)   Criminal Activity/Legal Involvement Pertinent to Current Situation/Hospitalization: No - Comment as needed  Activities of Daily Living      Permission Sought/Granted                  Emotional Assessment Appearance:: Appears stated age Attitude/Demeanor/Rapport: Engaged Affect (typically observed): Accepting Orientation: : Oriented to Self, Oriented to Place, Oriented to  Time, Oriented to Situation   Psych Involvement: No (comment)  Admission  diagnosis:  Weakness [R53.1] Generalized weakness [R53.1] AKI (acute kidney injury) ([place]) [N17.9] Opiate overdose, accidental or unintentional, initial encounter ([place]) [T40.601A] Altered mental status, unspecified altered mental status type [R41.82] Patient Active Problem List   Diagnosis Date Noted  . Weakness 08/27/2020  . Change in mental status 08/27/2020  . Mental status change resolved 08/27/2020  . Weakness of left side of body 06/29/2020  . ACS (acute coronary syndrome) ([place]) 08/25/2019  . Chest pain 08/25/2019  . Postoperative seroma involving nervous system after nervous system procedure 04/05/2019  . Drainage from wound 04/02/2019  . Infective otitis externa of left ear   . Left otitis media   . AKI (acute kidney injury) ([place])   . Urinary retention   . Orthostasis   . Acute blood loss anemia   . Fibromyalgia   . Chronic pain syndrome   . Lumbar radiculopathy 03/08/2019  . Orthostatic hypotension 03/06/2019  . Postoperative urinary retention 03/06/2019  . Lumbar foraminal stenosis 03/04/2019  . S/P exploratory laparotomy 03/06/2018  . Bowel obstruction ([place]) 03/06/2018  . Chest pain, rule out acute myocardial infarction 12/17/2017  . Hypokalemia 12/17/2017  . Acute lower UTI 12/17/2017  . Vertigo 04/30/2017  . Small vessel disease, cerebrovascular 04/30/2017  . Chronic low back pain 08/01/2015  . Right hip pain 08/01/2015  . Abnormality of gait 08/01/2015  . Left leg weakness   . Low  back pain with radiation   . Syncope 07/30/2014  . Atypical chest pain 03/12/2014  . Lap chole [facility] April 2013 01/29/2012  . Gallstones 12/04/2011  . Thyroid nodule-non neoplastic goiter by needle aspiration 12/04/2011  . GLUCOSE INTOLERANCE 03/12/2010  . DYSLIPIDEMIA 03/12/2010  . Chronic migraine 03/12/2010  . Carotid stenosis 03/12/2010  . CEREBROVASCULAR ACCIDENT 03/12/2010  . LIPOMA 01/29/2010  . HEADACHE 01/29/2010  . ANKLE INJURY, RIGHT 04/12/2009  . PHARYNGITIS  03/21/2009  . Backache 03/01/2009  . ALLERGIC RHINITIS 03/17/2007  . LOW BACK PAIN 03/17/2007  . Essential hypertension 01/01/2007  . ANXIETY STATE NOS 09/11/2005   PCP:  Aletha Halim., PA-C Pharmacy:   [id number] [place] [id number]: [phone]: [phone]    Social Determinants of Health (SDOH) Interventions    Readmission Risk Interventions Readmission Risk Prevention Plan 08/29/2019 03/08/2019  Transportation Screening Complete Complete  PCP or Specialist Appt within 5-7 Days - Complete  PCP or Specialist Appt within 3-5 Days Complete -  Home Care Screening - Complete  Medication Review (RN CM) - Complete  HRI or Home Care Consult Complete -  Social Work Consult for Recovery Care Planning/Counseling Complete -  Palliative Care Screening Complete -  Medication Review (Press photographer Complete -  Some recent data might be hidden

## 2020-08-28 NOTE — Progress Notes (Signed)
EEG complete - results pending 

## 2020-08-28 NOTE — ED Notes (Signed)
Attempted report to floor. Name and callback number provided.

## 2020-08-28 NOTE — Progress Notes (Signed)
Re: Jodi Bowman DOB: [birth year] Date: 08/28/2020   To Whom It May Concern:  Please be advised that the above-named patient will require a short-term nursing home stay--anticipated 30 days or less for rehabilitation and strengthening. The plan is for home.

## 2020-08-28 NOTE — Progress Notes (Signed)
[facility] PROGRESS NOTE    Jodi Bowman  WER:[id number] DOB: 04-12-1961 DOA: 08/27/2020 PCP: Aletha Halim., PA-C      Brief Narrative:  Jodi Bowman is a 59 y.o. 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  Patient admitted 2 months ago with generalized weakness, observed overnight on fluids, evaluated by neurology and psychiatry, discharged the next day.  Since then, recovered completely, seen by PCP without symptoms.  Then on day of this admission, had headache, vomited 6-7 times, and was at work when she felt dizzy and weak and nearly passed out.  BIB husband and patient was somnolent in the ER, got Narcan and became interactive.  Subseuqnetly found to have mild AKI, hospitalists asked to evaluate overnight.       Assessment & Plan:  Acute dehydration and AKI due to vomiting due to migraine Orthostasis Patient admitted and given fluids overnight.  This afternoon, after IV fluids, patient remains orthostatic with hypotension to 80/50 with standing.  Cr improved on fluids. -Continue IV fluids -Check orthostatics tomorrow -Hold Imdur and hydralazine -Hold losartan HCTZ for now   Somnolence and altered mental status due to opiate overdose -Hold Jodi contin until patient more alert -Hold Vicodin until patient less symptomatic  Hypertension -Continue amlodipine metoprolol -Hold losartan and HCTZ  Cerebrovascular disease, secondary prevention -Continue aspirin  Fibromyalgia Depression -Continue duloxetine -Continue SSRI  Anemia Hgb 11 -> 9.  In absence of bleeding, hematemesis or melena in history, and none noted here, presume this is dilutional.  -Trend Hgb  Elevated troponin Elevated D-dimer No evidence of chest pain, angina, exertional symptoms.  Doubt ACS, troponin low and flat in setting of poor renal function, no further ischemic  work-up.  D-dimer elevation nonspecific.  Chest CT angiogram ordered, is negative, no evidence of DVT       Disposition: Status is: Inpatient  Remains inpatient appropriate because:still severely orthostatic after IV fluids and also unable to walk independently, without safe discharge plan   Dispo: The patient is from: Home              Anticipated d/c is to: SNF              Anticipated d/c date is: 1 day              Patient currently is not medically stable to d/c.    Patient was admitted with dehydration, AKI, orthostasis in the setting of vomiting, and opiate overdose.  She remains orthostatic, we will continue IV fluids overnight and seek SNF placement.  Likely to SNF tomorrow or the next day.          MDM: The below labs and imaging reports were reviewed and summarized above.  Medication management as above.    DVT prophylaxis: heparin injection 5,000 Units Start: 08/27/20 2200 Place TED hose Start: 08/27/20 1844  Code Status: FULL Family Communication:       Procedures:   EEG normal  CTA chest no pneumonia or PE           Subjective: The patient remains very dizzy and weak with standing, very unsteady on her feet.  Orthostatic with standing.  No fever, cough, dysuria.  No abdominal pain.  Objective: Vitals:   08/28/20 1122 08/28/20 1125 08/28/20 1456 08/28/20 1558  BP: 122/75 122/75 107/72 122/86  Pulse: 79 79  96  Resp: 16   20  Temp: 97.8 F (36.6 C) 97.6 F (36.4 C)  97.9 F (36.6 C)  TempSrc: Oral Oral  Oral  SpO2:  100%    Weight:      Height:       No intake or output data in the 24 hours ending 08/28/20 1937 Filed Weights   08/27/20 1305  Weight: 64 kg    Examination: General appearance:  adult female, alert and in no obvious distress.  Appears tired HEENT: Anicteric, conjunctiva pink, lids and lashes normal. No nasal deformity, discharge, epistaxis.  Lips moist.   Skin: Warm and dry.  No jaundice.  No suspicious  rashes or lesions. Cardiac: RRR, nl S1-S2, no murmurs appreciated.  Capillary refill is brisk.  JVp not visible.  No LE edema.  Radial  pulses 2+ and symmetric. Respiratory: Normal respiratory rate and rhythm.  CTAB without rales or wheezes. Abdomen: Abdomen soft.  No TTP or guarding. No ascites, distension, hepatosplenomegaly.   MSK: No deformities or effusions. Neuro: Awake but sleepy.  EOMI, moves all extremities with severe generalized weakness. Speech fluent.    Psych: Sensorium intact and responding to questions, attention normal. Affect blunted.  Judgment and insight appear slightly impaired.    Data Reviewed: I have personally reviewed following labs and imaging studies:  CBC: Recent Labs  Lab 08/27/20 1250 08/28/20 0350  WBC 10.6* 7.7  HGB 11.5* 9.5*  HCT 35.8* 29.3*  MCV 91.8 90.2  PLT 234 578   Basic Metabolic Panel: Recent Labs  Lab 08/27/20 1250 08/28/20 0350  NA 145 141  K 3.3* 3.0*  CL 103 102  CO2 30 33*  GLUCOSE 121* 62*  BUN 18 13  CREATININE 1.53* 1.01*  CALCIUM 8.5* 7.9*   GFR: Estimated Creatinine Clearance: 48.2 mL/min (A) (by C-G formula based on SCr of 1.01 mg/dL (H)). Liver Function Tests: Recent Labs  Lab 08/27/20 1250  AST 50*  ALT 34  ALKPHOS 87  BILITOT 0.6  PROT 5.7*  ALBUMIN 2.9*   No results for input(s): LIPASE, AMYLASE in the last 168 hours. Recent Labs  Lab 08/27/20 2017  AMMONIA 38*   Coagulation Profile: No results for input(s): INR, PROTIME in the last 168 hours. Cardiac Enzymes: No results for input(s): CKTOTAL, CKMB, CKMBINDEX, TROPONINI in the last 168 hours. BNP (last 3 results) No results for input(s): PROBNP in the last 8760 hours. HbA1C: No results for input(s): HGBA1C in the last 72 hours. CBG: Recent Labs  Lab 08/27/20 1252 08/27/20 1515  GLUCAP 59* 116*   Lipid Profile: No results for input(s): CHOL, HDL, LDLCALC, TRIG, CHOLHDL, LDLDIRECT in the last 72 hours. Thyroid Function Tests: Recent Labs     08/27/20 2017  TSH 0.909   Anemia Panel: Recent Labs    08/27/20 2017  VITAMINB12 431   Urine analysis:    Component Value Date/Time   COLORURINE YELLOW 08/27/2020 1835   APPEARANCEUR CLEAR 08/27/2020 1835   LABSPEC 1.011 08/27/2020 1835   PHURINE 5.0 08/27/2020 1835   GLUCOSEU NEGATIVE 08/27/2020 1835   HGBUR NEGATIVE 08/27/2020 1835   HGBUR negative 03/01/2009 0853   BILIRUBINUR NEGATIVE 08/27/2020 1835   KETONESUR NEGATIVE 08/27/2020 [place] NEGATIVE 08/27/2020 1835   UROBILINOGEN 1.0 10/09/2014 2018   NITRITE NEGATIVE 08/27/2020 1835   LEUKOCYTESUR NEGATIVE 08/27/2020 1835   Sepsis Labs: @LABRCNTIP (procalcitonin:4,lacticacidven:4)  ) Recent Results (from the past 240 hour(s))  Resp Panel by RT-PCR (Flu A&B, Covid) Nasopharyngeal Swab     Status: None   Collection Time:  08/27/20  6:21 PM   Specimen: Nasopharyngeal Swab; Nasopharyngeal(NP) swabs in vial transport medium  Result Value Ref Range Status   SARS Coronavirus 2 by RT PCR NEGATIVE NEGATIVE Final    Comment: (NOTE) SARS-CoV-2 target nucleic acids are NOT DETECTED.  The SARS-CoV-2 RNA is generally detectable in upper respiratory specimens during the acute phase of infection. The lowest concentration of SARS-CoV-2 viral copies this assay can detect is 138 copies/mL. A negative result does not preclude SARS-Cov-2 infection and should not be used as the sole basis for treatment or other patient management decisions. A negative result may occur with  improper specimen collection/handling, submission of specimen other than nasopharyngeal swab, presence of viral mutation(s) within the areas targeted by this assay, and inadequate number of viral copies(<138 copies/mL). A negative result must be combined with clinical observations, patient history, and epidemiological information. The expected result is Negative.  Fact Sheet for Patients:  EntrepreneurPulse.com.au  Fact Sheet for  Healthcare Providers:  IncredibleEmployment.be  This test is no t yet approved or cleared by the [place] FDA and  has been authorized for detection and/or diagnosis of SARS-CoV-2 by FDA under an Emergency Use Authorization (EUA). This EUA will remain  in effect (meaning this test can be used) for the duration of the COVID-19 declaration under Section 564(b)(1) of the Act, 21 U.S.C.section 360bbb-3(b)(1), unless the authorization is terminated  or revoked sooner.       Influenza A by PCR NEGATIVE NEGATIVE Final   Influenza B by PCR NEGATIVE NEGATIVE Final    Comment: (NOTE) The Xpert Xpress SARS-CoV-2/FLU/RSV plus assay is intended as an aid in the diagnosis of influenza from Nasopharyngeal swab specimens and should not be used as a sole basis for treatment. Nasal washings and aspirates are unacceptable for Xpert Xpress SARS-CoV-2/FLU/RSV testing.  Fact Sheet for Patients: EntrepreneurPulse.com.au  Fact Sheet for Healthcare Providers: IncredibleEmployment.be  This test is not yet approved or cleared by the [place] FDA and has been authorized for detection and/or diagnosis of SARS-CoV-2 by FDA under an Emergency Use Authorization (EUA). This EUA will remain in effect (meaning this test can be used) for the duration of the COVID-19 declaration under Section 564(b)(1) of the Act, 21 U.S.C. section 360bbb-3(b)(1), unless the authorization is terminated or revoked.  Performed at [facility], [place] [address] [place], [place] [address]          Radiology Studies: EEG  Result Date: 08/28/2020 Lora Havens, MD     08/28/2020  4:08 PM Patient Name: GENNELL HOW MRN: [id number] Epilepsy Attending: Lora Havens Referring Physician/Provider: Dr. Myrene Buddy Date: 08/28/2020 Duration: 25.47 mins Patient history: 59 year old female presented with altered mental status and left-sided weakness.   EEG to evaluate for seizures. Level of alertness: Awake, drowsy AEDs during EEG study: Pregabalin Technical aspects: This EEG study was done with scalp electrodes positioned according to the 10-20 International system of electrode placement. Electrical activity was acquired at a sampling rate of 500Hz  and reviewed with a high frequency filter of 70Hz  and a low frequency filter of 1Hz . EEG data were recorded continuously and digitally stored. Description: The posterior dominant rhythm consists of 8 Hz activity of moderate voltage (25-35 uV) seen predominantly in posterior head regions, symmetric and reactive to eye opening and eye closing. Drowsiness was characterized by attenuation of the posterior background rhythm. Physiologic photic driving was  seen during photic stimulation.  Hyperventilation was not performed.   IMPRESSION: This study is within normal  limits. No seizures or epileptiform discharges were seen throughout the recording. Priyanka Barbra Sarks   CT ABDOMEN PELVIS WO CONTRAST  Result Date: 08/27/2020 CLINICAL DATA:  Altered level of consciousness, unresponsive, acute abdominal pain EXAM: CT ABDOMEN AND PELVIS WITHOUT CONTRAST TECHNIQUE: Multidetector CT imaging of the abdomen and pelvis was performed following the standard protocol without IV contrast. COMPARISON:  11/18/2019 FINDINGS: Lower chest: No acute pleural or parenchymal lung disease. Hepatobiliary: No focal liver abnormality is seen. Status post cholecystectomy. No biliary dilatation. Pancreas: Unremarkable. No pancreatic ductal dilatation or surrounding inflammatory changes. Spleen: Normal in size without focal abnormality. Adrenals/Urinary Tract: Stable right renal cyst. No urinary tract calculi or obstructive uropathy. Foley catheter partially decompresses the bladder. The adrenals are unremarkable. Stomach/Bowel: No bowel obstruction or ileus. Normal appendix right lower quadrant. Postsurgical changes from bariatric surgery.  Vascular/Lymphatic: No significant vascular findings are present. No enlarged abdominal or pelvic lymph nodes. Reproductive: Status post hysterectomy. No adnexal masses. Other: No free fluid or free gas.  No abdominal wall hernia. Musculoskeletal: No acute or destructive bony lesions. Stable postsurgical changes from L4 through S1. Reconstructed images demonstrate no additional findings. IMPRESSION: 1. No acute intra-abdominal or intrapelvic process. Electronically Signed   By: Randa Ngo M.D.   On: 08/27/2020 20:06   CT Head Wo Contrast  Result Date: 08/27/2020 CLINICAL DATA:  59 year old female with delirium. EXAM: CT HEAD WITHOUT CONTRAST TECHNIQUE: Contiguous axial images were obtained from the base of the skull through the vertex without intravenous contrast. COMPARISON:  Head CT dated 06/29/2020. FINDINGS: Brain: The ventricles and sulci appropriate size for patient's age. The gray-white matter discrimination is preserved. There is no acute intracranial hemorrhage. No mass effect or midline shift. No extra-axial fluid collection. Vascular: No hyperdense vessel or unexpected calcification. Skull: Normal. Negative for fracture or focal lesion. Sinuses/Orbits: Mild mucoperiosteal thickening of the left maxillary sinus. The remainder of the visualized paranasal sinuses and mastoid air cells are clear. Other: None IMPRESSION: Unremarkable noncontrast CT of the brain. Electronically Signed   By: Anner Crete M.D.   On: 08/27/2020 16:57   CT ANGIO CHEST PE W OR WO CONTRAST  Result Date: 08/28/2020 CLINICAL DATA:  Weakness, dizziness, elevated D-dimer, question pulmonary embolism EXAM: CT ANGIOGRAPHY CHEST WITH CONTRAST TECHNIQUE: Multidetector CT imaging of the chest was performed using the standard protocol during bolus administration of intravenous contrast. Multiplanar CT image reconstructions and MIPs were obtained to evaluate the vascular anatomy. CONTRAST:  64mL OMNIPAQUE IOHEXOL 350 MG/ML  SOLN IV COMPARISON:  11/18/2019 FINDINGS: Cardiovascular: Atherosclerotic calcification aorta, minimally in coronary arteries. Heart upper normal size. No pericardial effusion. Pulmonary arteries adequately opacified and patent. No evidence of pulmonary embolism. Mediastinum/Nodes: Small amount of high density medication within esophagus into stomach. Asymmetric prominence of the RIGHT thyroid lobe versus LEFT with a stable linear calcification. No discrete mass. No thoracic adenopathy. Lungs/Pleura: Dependent atelectasis in BILATERAL lower lobes. Tiny BILATERAL pleural effusions. Remaining lungs clear. No infiltrate or pneumothorax. Upper Abdomen: Gallbladder surgically absent. Prior gastric bypass surgery. Cyst at upper pole LEFT kidney unchanged. Musculoskeletal: Unremarkable Review of the MIP images confirms the above findings. IMPRESSION: No evidence of pulmonary embolism. Tiny BILATERAL pleural effusions with dependent atelectasis in BILATERAL lower lobes. Post cholecystectomy and gastric bypass surgery per Aortic Atherosclerosis (ICD10-I70.0). Electronically Signed   By: Lavonia Dana M.D.   On: 08/28/2020 14:50   DG Chest Portable 1 View  Result Date: 08/27/2020 CLINICAL DATA:  Altered mental status. EXAM: PORTABLE CHEST 1 VIEW COMPARISON:  05/03/2020 FINDINGS: 1400 hours. Low volume film. The lungs are clear without focal pneumonia, edema, pneumothorax or pleural effusion. Interstitial markings are diffusely coarsened with chronic features. The cardiopericardial silhouette is within normal limits for size. The visualized bony structures of the thorax show no acute abnormality. Telemetry leads overlie the chest. IMPRESSION: Chronic interstitial changes without acute cardiopulmonary findings. Electronically Signed   By: Misty Stanley M.D.   On: 08/27/2020 14:08        Scheduled Meds: . amLODipine  5 mg Oral Daily  . aspirin EC  81 mg Oral Daily  . Chlorhexidine Gluconate Cloth  6 each Topical  Daily  . DULoxetine  60 mg Oral Daily  . escitalopram  10 mg Oral QHS  . ferrous sulfate  325 mg Oral Daily  . folic acid  1 mg Oral Daily  . heparin  5,000 Units Subcutaneous Q8H  . hydrALAZINE  25 mg Oral Q8H  . isosorbide mononitrate  30 mg Oral Daily  . metoprolol succinate  100 mg Oral QHS  . mirtazapine  7.5 mg Oral QHS  . pantoprazole  40 mg Oral BID  . potassium chloride SA  20 mEq Oral BID  . pregabalin  75 mg Oral TID  . Vitamin D (Ergocalciferol)  50,000 Units Oral Once per day on Mon Wed Fri   Continuous Infusions: . lactated ringers 100 mL/hr at 08/28/20 1838     LOS: 0 days    Time spent: 35 minutes    Edwin Dada, MD [facility] 08/28/2020, 7:37 PM     Please page though [place] or Epic secure chat:  For [employer], Adult nurse

## 2020-08-28 NOTE — Procedures (Addendum)
Patient Name: Jodi Bowman  MRN: [id number]  Epilepsy Attending: Lora Havens  Referring Physician/Provider: Dr. Myrene Buddy Date: 08/28/2020 Duration: 25.47 mins  Patient history: 59 year old female presented with altered mental status and left-sided weakness.  EEG to evaluate for seizures.  Level of alertness: Awake, drowsy  AEDs during EEG study: Pregabalin  Technical aspects: This EEG study was done with scalp electrodes positioned according to the 10-20 International system of electrode placement. Electrical activity was acquired at a sampling rate of 500Hz  and reviewed with a high frequency filter of 70Hz  and a low frequency filter of 1Hz . EEG data were recorded continuously and digitally stored.   Description: The posterior dominant rhythm consists of 8 Hz activity of moderate voltage (25-35 uV) seen predominantly in posterior head regions, symmetric and reactive to eye opening and eye closing. Drowsiness was characterized by attenuation of the posterior background rhythm. Physiologic photic driving was  seen during photic stimulation.  Hyperventilation was not performed.     IMPRESSION: This study is within normal limits. No seizures or epileptiform discharges were seen throughout the recording.  Tahisha Hakim Barbra Sarks

## 2020-08-28 NOTE — Progress Notes (Signed)
Pt was admitted to the unit from ED. Pt does report pain and is being treated. All tele and IV are intact. Pt has all belongings and husband. Call light and telephone are within reach.   08/28/20 0944  Vitals  Temp 97.8 F (36.6 C)  Temp Source Oral  BP 127/79  MAP (mmHg) 93  BP Location Right Arm  BP Method Automatic  Patient Position (if appropriate) Lying  Pulse Rate 78  Pulse Rate Source Dinamap  Resp 18  Level of Consciousness  Level of Consciousness Alert  MEWS COLOR  MEWS Score Color Green  Oxygen Therapy  SpO2 94 %  O2 Device Room Air  Pain Assessment  Pain Scale 0-10  Pain Score 9  Pain Type Acute pain  Pain Location Back  Pain Orientation Lower  Pain Radiating Towards Hips  Pain Descriptors / Indicators Aching  Pain Frequency Constant  Patients Stated Pain Goal 1  Pain Intervention(s) RN made aware  Multiple Pain Sites No  MEWS Score  MEWS Temp 0  MEWS Systolic 0  MEWS Pulse 0  MEWS RR 0  MEWS LOC 0  MEWS Score 0  at bedside.

## 2020-08-28 NOTE — Evaluation (Signed)
Physical Therapy Evaluation Patient Details Name: Jodi Bowman MRN: [id number] DOB: [birth year] Today's Date: 08/28/2020   History of Present Illness  Pt is a 59 y/o female admitted secondary to change in mental status and weakness, likely from opioid overdose. PMH includes HTN, CVA, and fibromyalgia.   Clinical Impression  Pt admitted secondary to problem above with deficits below. Pt with increased pain and shakiness in BLE during mobility tasks. Requiring min A to ambulate a few steps using RW. Pt reporting legs feel like they may buckle. Discussed SNF with pt, however, pt likely to refuse, reporting her sister can come assist if needed. Will require max HH services if refuses. Has all necessary DME. Will continue to follow acutely.     Follow Up Recommendations SNF;Supervision/Assistance - 24 hour (pt likely to refuse; will need max HH services)    Equipment Recommendations  None recommended by PT    Recommendations for Other Services       Precautions / Restrictions Precautions Precautions: Fall Restrictions Weight Bearing Restrictions: No      Mobility  Bed Mobility Overal bed mobility: Needs Assistance Bed Mobility: Supine to Sit;Sit to Supine     Supine to sit: Min assist Sit to supine: Supervision   General bed mobility comments: Min A for LE assist to come to sitting.     Transfers Overall transfer level: Needs assistance Equipment used: Rolling walker (2 wheeled) Transfers: Sit to/from Stand Sit to Stand: Min assist         General transfer comment: Min A for lift assist and steadying to stand.   Ambulation/Gait Ambulation/Gait assistance: Min assist Gait Distance (Feet): 2 Feet Assistive device: Rolling walker (2 wheeled) Gait Pattern/deviations: Step-through pattern;Decreased stride length;Ataxic Gait velocity: Very slow   General Gait Details: Noted uncoordinated steps with increased shakiness in BLE. Min A for steadying. Pt reporting her  legs felt like they would buckle on her. Further mobility deferred.   Stairs            Wheelchair Mobility    Modified Rankin (Stroke Patients Only)       Balance Overall balance assessment: Needs assistance Sitting-balance support: No upper extremity supported;Feet supported Sitting balance-Leahy Scale: Fair     Standing balance support: Bilateral upper extremity supported;During functional activity Standing balance-Leahy Scale: Poor Standing balance comment: Reliant on BUE support                              Pertinent Vitals/Pain Pain Assessment: Faces Faces Pain Scale: Hurts even more Pain Location: back and BLE  Pain Descriptors / Indicators: Aching;Grimacing;Guarding Pain Intervention(s): Limited activity within patient's tolerance;Monitored during session;Repositioned    Home Living Family/patient expects to be discharged to:: Private residence Living Arrangements: Spouse/significant other;Children Available Help at Discharge: Family;Available 24 hours/day (reports sister can stay ) Type of Home: House Home Access: Ramped entrance     Home Layout: One level Home Equipment: Toilet riser;Cane - single point;Walker - 2 wheels;Walker - 4 wheels;Bedside commode;Shower seat;Hand held shower head;Wheelchair - manual      Prior Function Level of Independence: Needs assistance   Gait / Transfers Assistance Needed: Uses cane vs RW for mobility   ADL's / Homemaking Assistance Needed: Reports needs some assist with dressing.         Hand Dominance   Dominant Hand: Right    Extremity/Trunk Assessment   Upper Extremity Assessment Upper Extremity Assessment: Defer to OT evaluation  Lower Extremity Assessment Lower Extremity Assessment: Generalized weakness;RLE deficits/detail;LLE deficits/detail RLE Deficits / Details: decreased coordination when taking steps.  LLE Deficits / Details: decreased coordination when taking steps.     Cervical  / Trunk Assessment Cervical / Trunk Assessment: Normal  Communication   Communication: No difficulties  Cognition Arousal/Alertness: Awake/alert Behavior During Therapy: Flat affect Overall Cognitive Status: Within Functional Limits for tasks assessed                                        General Comments      Exercises     Assessment/Plan    PT Assessment Patient needs continued PT services  PT Problem List Decreased strength;Decreased activity tolerance;Decreased balance;Decreased coordination;Decreased mobility;Decreased knowledge of use of DME;Decreased safety awareness;Decreased knowledge of precautions;Pain       PT Treatment Interventions DME instruction;Gait training;Stair training;Functional mobility training;Therapeutic exercise;Therapeutic activities;Balance training;Patient/family education    PT Goals (Current goals can be found in the Care Plan section)  Acute Rehab PT Goals Patient Stated Goal: to get stronger before going home PT Goal Formulation: With patient Time For Goal Achievement: 09/11/20 Potential to Achieve Goals: Fair    Frequency Min 3X/week   Barriers to discharge        Co-evaluation               AM-PAC PT "6 Clicks" Mobility  Outcome Measure Help needed turning from your back to your side while in a flat bed without using bedrails?: None Help needed moving from lying on your back to sitting on the side of a flat bed without using bedrails?: A Little Help needed moving to and from a bed to a chair (including a wheelchair)?: A Little Help needed standing up from a chair using your arms (e.g., wheelchair or bedside chair)?: A Little Help needed to walk in hospital room?: A Little Help needed climbing 3-5 steps with a railing? : Total 6 Click Score: 17    End of Session Equipment Utilized During Treatment: Gait belt Activity Tolerance: Patient limited by fatigue;Patient limited by pain Patient left: in bed;with  call bell/phone within reach;with bed alarm set;with family/visitor present Nurse Communication: Mobility status PT Visit Diagnosis: Unsteadiness on feet (R26.81);Muscle weakness (generalized) (M62.81)    Time: 5465-0354 PT Time Calculation (min) (ACUTE ONLY): 19 min   Charges:   PT Evaluation $PT Eval Moderate Complexity: 1 Mod          Jodi Bowman, PT, DPT  Acute Rehabilitation Services  Pager: [phone] Office: [phone]   Rudean Hitt 08/28/2020, 10:54 AM

## 2020-08-29 DIAGNOSIS — N179 Acute kidney failure, unspecified: Secondary | ICD-10-CM | POA: Diagnosis not present

## 2020-08-29 LAB — COMPREHENSIVE METABOLIC PANEL
ALT: 23 U/L (ref 0–44)
AST: 31 U/L (ref 15–41)
Albumin: 1.9 g/dL — ABNORMAL LOW (ref 3.5–5.0)
Alkaline Phosphatase: 65 U/L (ref 38–126)
Anion gap: 7 (ref 5–15)
BUN: 10 mg/dL (ref 6–20)
CO2: 35 mmol/L — ABNORMAL HIGH (ref 22–32)
Calcium: 8.1 mg/dL — ABNORMAL LOW (ref 8.9–10.3)
Chloride: 103 mmol/L (ref 98–111)
Creatinine, Ser: 0.87 mg/dL (ref 0.44–1.00)
GFR, Estimated: >60 mL/min (ref >60)
Glucose, Bld: 69 mg/dL — ABNORMAL LOW (ref 70–99)
Potassium: 4.1 mmol/L (ref 3.5–5.1)
Sodium: 145 mmol/L (ref 135–145)
Total Bilirubin: 0.5 mg/dL (ref 0.3–1.2)
Total Protein: 4.1 g/dL — ABNORMAL LOW (ref 6.5–8.1)

## 2020-08-29 LAB — CBC
HCT: 28 % — ABNORMAL LOW (ref 36.0–46.0)
Hemoglobin: 9.4 g/dL — ABNORMAL LOW (ref 12.0–15.0)
MCH: 29.8 pg (ref 26.0–34.0)
MCHC: 33.6 g/dL (ref 30.0–36.0)
MCV: 88.9 fL (ref 80.0–100.0)
Platelets: 204 10*3/uL (ref 150–400)
RBC: 3.15 MIL/uL — ABNORMAL LOW (ref 3.87–5.11)
RDW: 16.4 % — ABNORMAL HIGH (ref 11.5–15.5)
WBC: 6.9 10*3/uL (ref 4.0–10.5)
nRBC: 0 % (ref 0.0–0.2)

## 2020-08-29 IMAGING — DX DG CHEST 2V
2 series · 2 of 2 positions shown · non-contrast
Comparison: August 25, 2019

CLINICAL DATA: Leg swelling.

EXAM:
CHEST - 2 VIEW

[w chest pa]
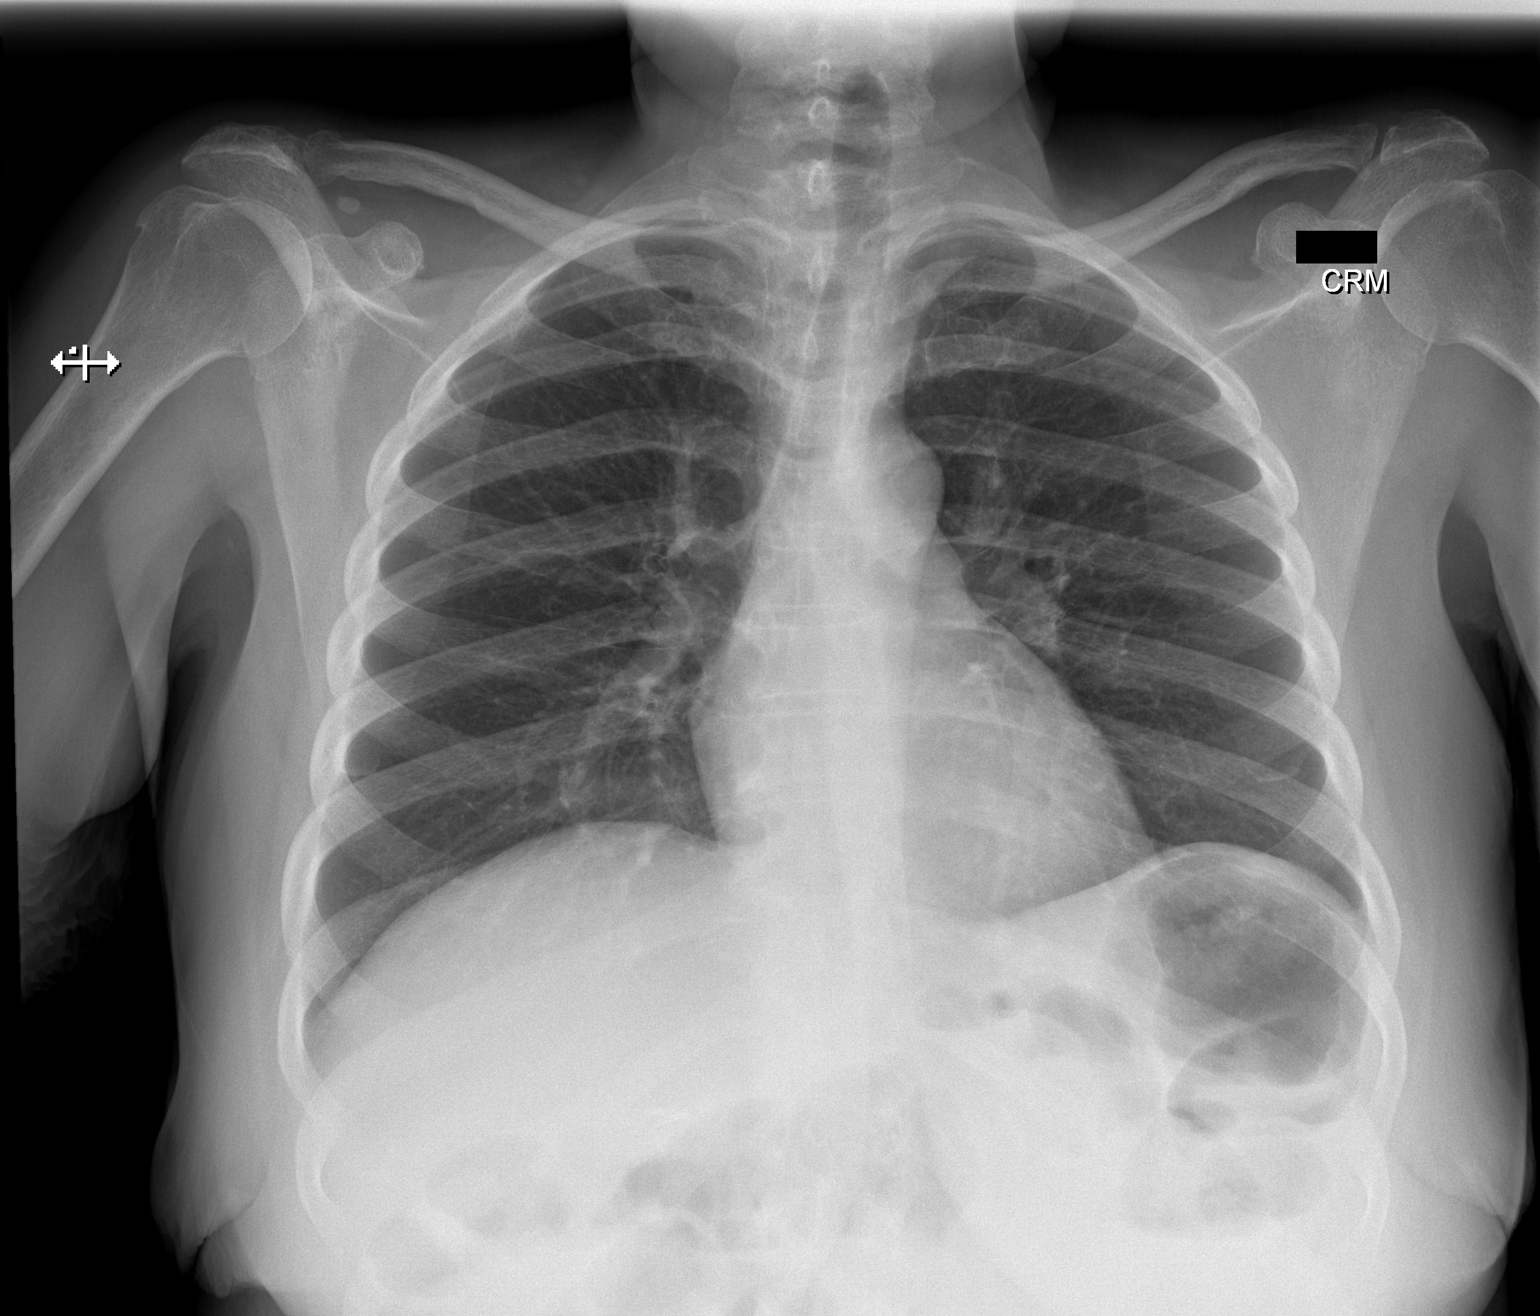

[w chest lat]
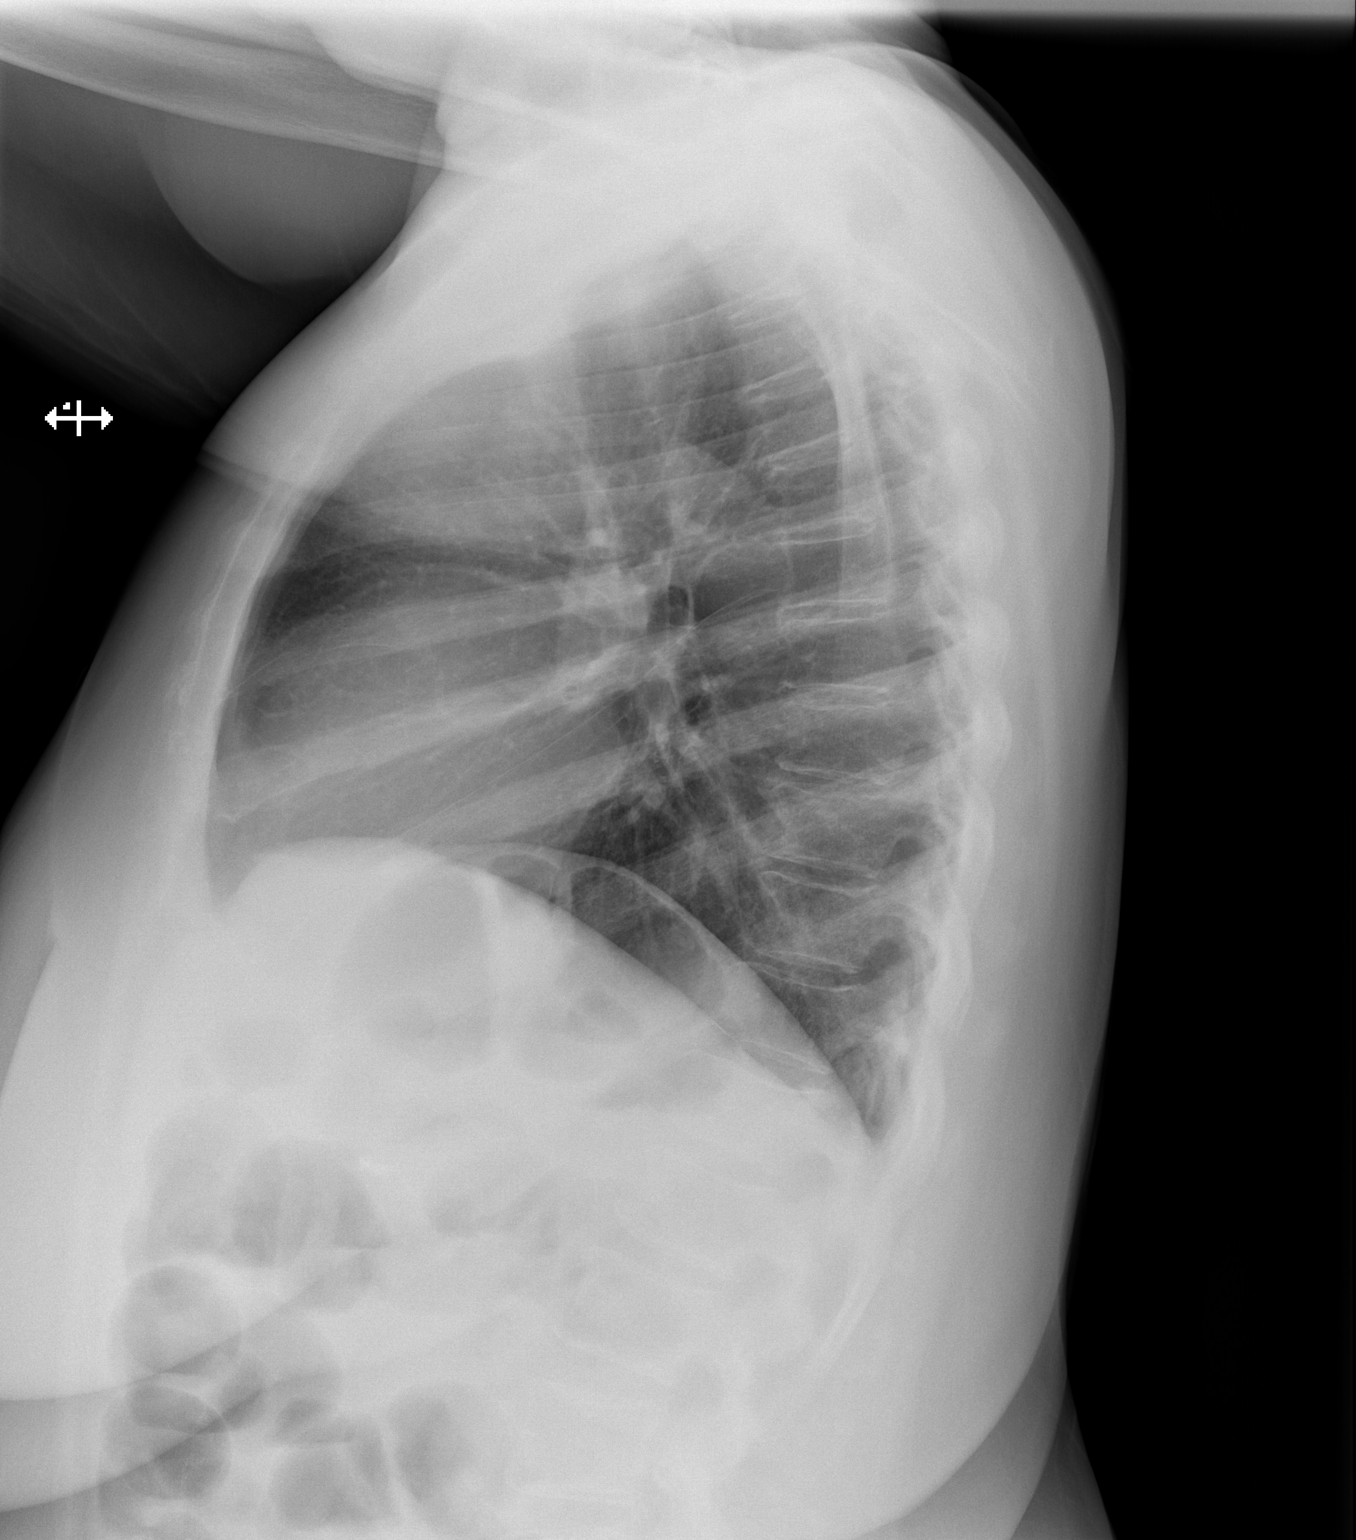

[2 of 2 positions shown; findings below may reference images not displayed]

FINDINGS: The heart size and mediastinal contours are within normal limits.
Both lungs are clear. The visualized skeletal structures are
unremarkable.
IMPRESSION: No active cardiopulmonary disease.

## 2020-08-29 NOTE — Evaluation (Addendum)
Clinical/Bedside Swallow Evaluation Patient Details  Name: Jodi Bowman MRN: [id number] Date of Birth: July 15, 1961  Today's Date: 08/29/2020 Time: SLP Start Time (ACUTE ONLY): 1011 SLP Stop Time (ACUTE ONLY): 1024 SLP Time Calculation (min) (ACUTE ONLY): 13 min  Past Medical History:  Past Medical History:  Diagnosis Date  . Arthritis   . Calculus of gallbladder without mention of cholecystitis or obstruction   . Dizziness   . Fibromyalgia   . GERD (gastroesophageal reflux disease)   . Goiter   . Hypercholesteremia   . Hypertension   . Lower back pain   . Migraine   . Nontoxic uninodular goiter    sees dr vollmer at [facility]  . Obesity   . PONV (postoperative nausea and vomiting)   . Sleep apnea    STOPBANG=5  . Stroke [facility]) 2000   Past Surgical History:  Past Surgical History:  Procedure Laterality Date  . ABDOMINAL HYSTERECTOMY    . BOWEL RESECTION N/A 03/06/2018   Procedure: SMALL BOWEL ANASTAMOSIS;  Surgeon: Clovis Riley, MD;  Location: [facility];  Service: General;  Laterality: N/A;  . CATARACT EXTRACTION W/ INTRAOCULAR LENS IMPLANT Bilateral   . CESAREAN SECTION  yrs ago   done x 2  . CHOLECYSTECTOMY  01/05/2012   Procedure: LAPAROSCOPIC CHOLECYSTECTOMY WITH INTRAOPERATIVE CHOLANGIOGRAM;  Surgeon: Pedro Earls, MD;  Location: [facility];  Service: General;  Laterality: N/A;  . COLONOSCOPY  10/08/2012   Procedure: COLONOSCOPY;  Surgeon: Beryle Beams, MD;  Location: [facility];  Service: Endoscopy;  Laterality: N/A;  . KNEE ARTHROSCOPY  one 1995 and 1 in 1997   both knees done  . LAPAROSCOPY N/A 03/06/2018   Procedure: LAPAROSCOPY DIAGNOSTIC WITH LYSIS OF ADHESIONS;  Surgeon: Clovis Riley, MD;  Location: [facility];  Service: General;  Laterality: N/A;  . LAPAROTOMY N/A 03/06/2018   Procedure: EXPLORATORY LAPAROTOMY, RESECTION OF DISTAL ROUX, CLOSURE OF INTERNAL HERNIA;  Surgeon: Clovis Riley, MD;  Location: [facility];  Service: General;  Laterality: N/A;  .  LEFT HEART CATH AND CORONARY ANGIOGRAPHY N/A 08/29/2019   Procedure: LEFT HEART CATH AND CORONARY ANGIOGRAPHY;  Surgeon: Charolette Forward, MD;  Location: [facility];  Service: Cardiovascular;  Laterality: N/A;  . LUMBAR WOUND DEBRIDEMENT N/A 04/03/2019   Procedure: LUMBAR WOUND WASHOUT;  Surgeon: Judith Part, MD;  Location: [facility];  Service: Neurosurgery;  Laterality: N/A;  . POSTERIOR LUMBAR FUSION  03/04/2019  . surgery for endometriosis  yrs ago  . thryoid biopsy  December 01, 2011    at [facility]   HPI:  Pt is a 59 y/o female admitted secondary to change in mental status and weakness, likely from opioid overdose. PMH includes HTN, CVA, and fibromyalgia   Assessment / Plan / Recommendation Clinical Impression  Pt reports hx of episodic dysphagia including globus sensation to large medicines and dry foods, as well as intermittent coughing with thin liquids. Pt with edentulous upper and missing lower dentition, impacting timely mastication and delaying bolus transit. Per palpation swallow initation appeared delayed. Pt with intermittent throat clear with thin liquids however no overt s/sx of aspiration. Pt reports hx of GERD on PPI, though reports recent worsening symptoms. Recommend continuation of regular thin liquid diet and safe swallowing strategies. If difficulty persists or worsens instrumental assessment may be indicated. SLP to follow up.   SLP Visit Diagnosis: Dysphagia, unspecified (R13.10)    Aspiration Risk  Mild aspiration risk    Diet Recommendation   Regular thin  liquids   Medication Administration: Whole meds with puree    Other  Recommendations Oral Care Recommendations: Oral care BID   Follow up Recommendations        Frequency and Duration min 1 x/week  1 week       Prognosis Prognosis for Safe Diet Advancement: Good      Swallow Study   General Date of Onset: 08/27/20 HPI: Pt is a 59 y/o female admitted secondary to change in mental status and weakness,  likely from opioid overdose. PMH includes HTN, CVA, and fibromyalgia Type of Study: Bedside Swallow Evaluation Previous Swallow Assessment: none on file Diet Prior to this Study: Regular;Thin liquids Temperature Spikes Noted: No Respiratory Status: Nasal cannula History of Recent Intubation: No Behavior/Cognition: Alert;Cooperative Oral Cavity Assessment: Within Functional Limits Oral Care Completed by SLP: No Oral Cavity - Dentition: Missing dentition (upper edentulous; missing some lower dentition) Vision: Functional for self-feeding Self-Feeding Abilities: Able to feed self Patient Positioning: Upright in bed Baseline Vocal Quality: Hoarse Volitional Swallow: Able to elicit    Oral/Motor/Sensory Function Overall Oral Motor/Sensory Function: Generalized oral weakness Facial ROM: Within Functional Limits Facial Symmetry: Within Functional Limits Facial Strength: Within Functional Limits Facial Sensation: Within Functional Limits Lingual ROM: Within Functional Limits Lingual Symmetry: Within Functional Limits Lingual Strength: Within Functional Limits Lingual Sensation: Within Functional Limits Velum: Within Functional Limits Mandible: Within Functional Limits   Ice Chips Ice chips: Impaired Presentation: Spoon Oral Phase Impairments: Impaired mastication Oral Phase Functional Implications: Prolonged oral transit Pharyngeal Phase Impairments: Suspected delayed Swallow;Multiple swallows;Decreased hyoid-laryngeal movement   Thin Liquid Thin Liquid: Impaired Presentation: Cup;Straw Pharyngeal  Phase Impairments: Suspected delayed Swallow;Multiple swallows;Throat Clearing - Delayed;Throat Clearing - Immediate;Decreased hyoid-laryngeal movement    Nectar Thick Nectar Thick Liquid: Not tested   Honey Thick Honey Thick Liquid: Not tested   Puree Puree: Impaired Pharyngeal Phase Impairments: Multiple swallows;Suspected delayed Swallow;Decreased hyoid-laryngeal movement   Solid      Solid: Impaired Presentation: Self Fed Oral Phase Functional Implications: Prolonged oral transit;Impaired mastication Pharyngeal Phase Impairments: Suspected delayed Swallow;Multiple swallows      Nahome Bublitz E Micha Dosanjh MA, CCC-SLP Acute Rehabilitation Services  08/29/2020,10:52 AM

## 2020-08-29 NOTE — Evaluation (Signed)
Occupational Therapy Evaluation Patient Details Name: Jodi Bowman MRN: [id number] DOB: [birth year] Today's Date: 08/29/2020    History of Present Illness Pt is a 59 y/o female admitted secondary to change in mental status and weakness, likely from opioid overdose. PMH includes HTN, CVA, and fibromyalgia.    Clinical Impression   PT admitted with AMS. Pt currently with functional limitiations due to the deficits listed below (see OT problem list). Pt was able to complete a full adl at the sink alternating UE use for hygiene at supervision level. Pt appears to be close to baseline but will follow 1 more treatment for balance challenged.  Pt will benefit from skilled OT to increase their independence and safety with adls and balance to allow discharge home without follow up.     Follow Up Recommendations  No OT follow up    Equipment Recommendations  None recommended by OT    Recommendations for Other Services       Precautions / Restrictions Precautions Precautions: Fall Restrictions Weight Bearing Restrictions: No      Mobility Bed Mobility Overal bed mobility: Modified Independent Bed Mobility: Supine to Sit;Sit to Supine     Supine to sit: Modified independent (Device/Increase time) Sit to supine: Modified independent (Device/Increase time)   General bed mobility comments: on BSC on arrival    Transfers Overall transfer level: Needs assistance Equipment used: Rolling walker (2 wheeled) Transfers: Sit to/from Stand Sit to Stand: Supervision              Balance Overall balance assessment: Needs assistance Sitting-balance support: No upper extremity supported;Feet supported Sitting balance-Leahy Scale: Good Sitting balance - Comments: supervision   Standing balance support: Single extremity supported;Bilateral upper extremity supported Standing balance-Leahy Scale: Fair Standing balance comment: reliant on UE support of RW                            ADL either performed or assessed with clinical judgement   ADL Overall ADL's : Needs assistance/impaired     Grooming: Wash/dry hands;Wash/dry face;Oral care;Applying deodorant;Supervision/safety;Standing   Upper Body Bathing: Modified independent;Standing   Lower Body Bathing: Supervison/ safety;Sit to/from stand   Upper Body Dressing : Modified independent   Lower Body Dressing: Modified independent   Toilet Transfer: Supervision/safety;RW;BSC   Toileting- Clothing Manipulation and Hygiene: Supervision/safety Toileting - Clothing Manipulation Details (indicate cue type and reason): able to alternate hands for peri care     Functional mobility during ADLs: Supervision/safety;Rolling walker       Vision         Perception     Praxis      Pertinent Vitals/Pain Pain Assessment: No/denies pain Faces Pain Scale: Hurts little more Pain Location: back Pain Descriptors / Indicators: Grimacing Pain Intervention(s): Monitored during session     Hand Dominance Right   Extremity/Trunk Assessment Upper Extremity Assessment Upper Extremity Assessment: Overall WFL for tasks assessed       Cervical / Trunk Assessment Cervical / Trunk Assessment: Normal   Communication Communication Communication: No difficulties   Cognition Arousal/Alertness: Awake/alert Behavior During Therapy: WFL for tasks assessed/performed Overall Cognitive Status: Within Functional Limits for tasks assessed                                     General Comments  VSS- [place] in place on no oxygen on arrival. pt saturations 100%  on RA    Exercises     Shoulder Instructions      Home Living Family/patient expects to be discharged to:: Private residence Living Arrangements: Spouse/significant other;Children Available Help at Discharge: Family;Available 24 hours/day Type of Home: House Home Access: Ramped entrance     Home Layout: One level     Bathroom Shower/Tub:  Occupational psychologist: Standard     Home Equipment: Toilet riser;Cane - single point;Walker - 2 wheels;Walker - 4 wheels;Bedside commode;Shower seat;Hand held shower head;Wheelchair - manual   Additional Comments: pt speaks of only a sister helping her with ADLs during session. noted that spouse is listed in chart      Prior Functioning/Environment Level of Independence: Needs assistance  Gait / Transfers Assistance Needed: Uses cane vs RW for mobility  ADL's / Homemaking Assistance Needed: Reports needs some assist with dressing.             OT Problem List: Decreased activity tolerance;Impaired balance (sitting and/or standing)      OT Treatment/Interventions: Self-care/ADL training;Therapeutic exercise;Neuromuscular education;Energy conservation;DME and/or AE instruction;Manual therapy;Therapeutic activities;Patient/family education;Balance training    OT Goals(Current goals can be found in the care plan section) Acute Rehab OT Goals Patient Stated Goal: to have someone to help me bath 2-3 times week OT Goal Formulation: With patient Time For Goal Achievement: 09/12/20 Potential to Achieve Goals: Good  OT Frequency: Min 2X/week   Barriers to D/C: Decreased caregiver support          Co-evaluation              AM-PAC OT "6 Clicks" Daily Activity     Outcome Measure Help from another person eating meals?: None Help from another person taking care of personal grooming?: None Help from another person toileting, which includes using toliet, bedpan, or urinal?: None Help from another person bathing (including washing, rinsing, drying)?: None Help from another person to put on and taking off regular upper body clothing?: None Help from another person to put on and taking off regular lower body clothing?: None 6 Click Score: 24   End of Session Equipment Utilized During Treatment: Rolling walker Nurse Communication: Mobility status;Precautions  Activity  Tolerance: Patient tolerated treatment well Patient left: in chair;with call bell/phone within reach;with chair alarm set  OT Visit Diagnosis: Unsteadiness on feet (R26.81);Muscle weakness (generalized) (M62.81)                Time: 1051-1110 OT Time Calculation (min): 19 min Charges:  OT General Charges $OT Visit: 1 Visit OT Evaluation $OT Eval Moderate Complexity: 1 Mod   Brynn, OTR/L  Acute Rehabilitation Services Pager: [phone] Office: [phone] .   Jeri Modena 08/29/2020, 1:57 PM

## 2020-08-29 NOTE — Progress Notes (Signed)
Physical Therapy Treatment Patient Details Name: Jodi Bowman MRN: [id number] DOB: October 22, 1960 Today's Date: 08/29/2020    History of Present Illness Pt is a 59 y/o female admitted secondary to change in mental status and weakness, likely from opioid overdose. PMH includes HTN, CVA, and fibromyalgia.     PT Comments    Pt tolerates treatment well with much improved ambulation and transfer quality. Pt is able to ambulate for household distances with use of RW, no physical assistance requirements at this time. Pt acknowledges she is mobilizing much better and that her cognition is much improved on this date. Pt and PT discuss improvements in mobility and pt reports feeling comfortable discharging home with assistance of family. PT updates recommendations to home with HHPT, no current DME needs.   Follow Up Recommendations  Home health PT;Supervision - Intermittent     Equipment Recommendations  None recommended by PT    Recommendations for Other Services       Precautions / Restrictions Precautions Precautions: Fall Restrictions Weight Bearing Restrictions: No    Mobility  Bed Mobility Overal bed mobility: Modified Independent Bed Mobility: Supine to Sit;Sit to Supine     Supine to sit: Modified independent (Device/Increase time) Sit to supine: Modified independent (Device/Increase time)   General bed mobility comments: increased time  Transfers Overall transfer level: Needs assistance Equipment used: Rolling walker (2 wheeled) Transfers: Sit to/from Stand Sit to Stand: Supervision            Ambulation/Gait Ambulation/Gait assistance: Supervision Gait Distance (Feet): 150 Feet Assistive device: Rolling walker (2 wheeled) Gait Pattern/deviations: Step-through pattern Gait velocity: reduced Gait velocity interpretation: <1.8 ft/sec, indicate of risk for recurrent falls General Gait Details: pt with shortened step through gait, reduced gait speed, no  significant balance deviations noted   Stairs             Wheelchair Mobility    Modified Rankin (Stroke Patients Only)       Balance Overall balance assessment: Needs assistance Sitting-balance support: No upper extremity supported;Feet supported Sitting balance-Leahy Scale: Good Sitting balance - Comments: supervision   Standing balance support: Single extremity supported;Bilateral upper extremity supported Standing balance-Leahy Scale: Poor Standing balance comment: reliant on UE support of RW                            Cognition Arousal/Alertness: Awake/alert Behavior During Therapy: WFL for tasks assessed/performed Overall Cognitive Status: Within Functional Limits for tasks assessed                                        Exercises      General Comments General comments (skin integrity, edema, etc.): VSS on RA      Pertinent Vitals/Pain Pain Assessment: Faces Faces Pain Scale: Hurts little more Pain Location: back Pain Descriptors / Indicators: Grimacing Pain Intervention(s): Monitored during session    Home Living                      Prior Function            PT Goals (current goals can now be found in the care plan section) Acute Rehab PT Goals Patient Stated Goal: to go home Progress towards PT goals: Progressing toward goals    Frequency    Min 3X/week      PT Plan  Discharge plan needs to be updated    Co-evaluation              AM-PAC PT "6 Clicks" Mobility   Outcome Measure  Help needed turning from your back to your side while in a flat bed without using bedrails?: None Help needed moving from lying on your back to sitting on the side of a flat bed without using bedrails?: None Help needed moving to and from a bed to a chair (including a wheelchair)?: None Help needed standing up from a chair using your arms (e.g., wheelchair or bedside chair)?: None Help needed to walk in hospital  room?: None Help needed climbing 3-5 steps with a railing? : A Little 6 Click Score: 23    End of Session Equipment Utilized During Treatment: Gait belt Activity Tolerance: Patient tolerated treatment well Patient left: in bed;with call bell/phone within reach;with bed alarm set Nurse Communication: Mobility status PT Visit Diagnosis: Unsteadiness on feet (R26.81);Muscle weakness (generalized) (M62.81)     Time: 4492-0100 PT Time Calculation (min) (ACUTE ONLY): 24 min  Charges:  $Gait Training: 8-22 mins $Therapeutic Activity: 8-22 mins                     Zenaida Niece, PT, DPT Acute Rehabilitation Pager: [phone]    Zenaida Niece 08/29/2020, 10:58 AM

## 2020-08-29 NOTE — Progress Notes (Signed)
PROGRESS NOTE    Jodi Bowman   GDJ:[id number]  DOB: 01-14-1961  DOA: 08/27/2020     1  PCP: Aletha Halim., PA-C  CC: AMS  Hospital Course: Jodi Bowman is a 59 y.o. 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   Patient admitted 2 months ago with generalized weakness, observed overnight on fluids, evaluated by neurology and psychiatry, discharged the next day.   Since then, recovered completely, seen by PCP without symptoms.  Then on day of this admission, had headache, vomited 6-7 times, and was at work when she felt dizzy and weak and nearly passed out.   BIB husband and patient was somnolent in the ER, got Narcan and became interactive.  Subseuqnetly found to have mild AKI.    Interval History:  More awake and alert this morning.  Still has catheter in place as she was having difficulty urinating on admission.  Voiding trial will be attempted today.  Old records reviewed in assessment of this patient  ROS: Constitutional: negative for chills and fevers, Respiratory: negative for cough, Cardiovascular: negative for chest pain and Gastrointestinal: negative for abdominal pain  Assessment & Plan: Acute dehydration and AKI due to vomiting due to migraine Orthostasis -Orthostatic on admission; blood pressure now improved with fluids -Continue holding Imdur, hydralazine, losartan, HCTZ -Continue fluids -Renal function has improved with fluids.  Advance diet and monitor further  Somnolence and altered mental status due to opiate overdose -Continue holding Jodi Contin and hydrocodone through today if patient able to tolerate with no withdrawal symptoms  Hypertension -Continue amlodipine, metoprolol -Hold losartan and HCTZ  Cerebrovascular disease, secondary prevention -Continue aspirin  Fibromyalgia Depression -Continue duloxetine -Continue SSRI  Anemia Hgb 11 ->  9.  In absence of bleeding, hematemesis or melena in history, and none noted here, presume this is dilutional.  -Trend Hgb  Elevated troponin Elevated D-dimer No evidence of chest pain, angina, exertional symptoms.  Doubt ACS, troponin low and flat in setting of poor renal function, no further ischemic work-up. D-dimer elevation nonspecific.  Chest CT angiogram ordered, is negative, no evidence of DVT  Antimicrobials: None  DVT prophylaxis: HSQ Code Status: Full Family Communication: None present Disposition Plan: Status is: Inpatient  Remains inpatient appropriate because:Altered mental status, IV treatments appropriate due to intensity of illness or inability to take PO and Inpatient level of care appropriate due to severity of illness   Dispo: The patient is from: Home              Anticipated d/c is to: Home              Anticipated d/c date is: 1 day              Patient currently is not medically stable to d/c.       Objective: Blood pressure 115/72, pulse 67, temperature 98.1 F (36.7 C), temperature source Oral, resp. rate 18, height 4\' 11"  (1.499 m), weight 64 kg, SpO2 100 %.  Examination: General appearance: Slightly lethargic but overall awake and alert Head: Normocephalic, without obvious abnormality, atraumatic Eyes: EOMI Lungs: clear to auscultation bilaterally Heart: regular rate and rhythm and S1, S2 normal Abdomen: normal findings: bowel sounds normal and soft, non-tender Extremities: No edema Skin: mobility and turgor normal Neurologic: Moves all 4 extremities  Consultants:   None  Procedures:   None  Data Reviewed: I have personally reviewed following labs and  imaging studies Results for orders placed or performed during the hospital encounter of 08/27/20 (from the past 24 hour(s))  CBC     Status: Abnormal   Collection Time: 08/29/20  2:29 AM  Result Value Ref Range   WBC 6.9 4.0 - 10.5 K/uL   RBC 3.15 (L) 3.87 - 5.11 MIL/uL   Hemoglobin  9.4 (L) 12.0 - 15.0 g/dL   HCT 28.0 (L) 36 - 46 %   MCV 88.9 80.0 - 100.0 fL   MCH 29.8 26.0 - 34.0 pg   MCHC 33.6 30.0 - 36.0 g/dL   RDW 16.4 (H) 11.5 - 15.5 %   Platelets 204 150 - 400 K/uL   nRBC 0.0 0.0 - 0.2 %  Comprehensive metabolic panel     Status: Abnormal   Collection Time: 08/29/20  2:29 AM  Result Value Ref Range   Sodium 145 135 - 145 mmol/L   Potassium 4.1 3.5 - 5.1 mmol/L   Chloride 103 98 - 111 mmol/L   CO2 35 (H) 22 - 32 mmol/L   Glucose, Bld 69 (L) 70 - 99 mg/dL   BUN 10 6 - 20 mg/dL   Creatinine, Ser 0.87 0.44 - 1.00 mg/dL   Calcium 8.1 (L) 8.9 - 10.3 mg/dL   Total Protein 4.1 (L) 6.5 - 8.1 g/dL   Albumin 1.9 (L) 3.5 - 5.0 g/dL   AST 31 15 - 41 U/L   ALT 23 0 - 44 U/L   Alkaline Phosphatase 65 38 - 126 U/L   Total Bilirubin 0.5 0.3 - 1.2 mg/dL   GFR, Estimated >60 >60 mL/min   Anion gap 7 5 - 15    Recent Results (from the past 240 hour(s))  Resp Panel by RT-PCR (Flu A&B, Covid) Nasopharyngeal Swab     Status: None   Collection Time: 08/27/20  6:21 PM   Specimen: Nasopharyngeal Swab; Nasopharyngeal(NP) swabs in vial transport medium  Result Value Ref Range Status   SARS Coronavirus 2 by RT PCR NEGATIVE NEGATIVE Final    Comment: (NOTE) SARS-CoV-2 target nucleic acids are NOT DETECTED.  The SARS-CoV-2 RNA is generally detectable in upper respiratory specimens during the acute phase of infection. The lowest concentration of SARS-CoV-2 viral copies this assay can detect is 138 copies/mL. A negative result does not preclude SARS-Cov-2 infection and should not be used as the sole basis for treatment or other patient management decisions. A negative result may occur with  improper specimen collection/handling, submission of specimen other than nasopharyngeal swab, presence of viral mutation(s) within the areas targeted by this assay, and inadequate number of viral copies(<138 copies/mL). A negative result must be combined with clinical observations,  patient history, and epidemiological information. The expected result is Negative.  Fact Sheet for Patients:  EntrepreneurPulse.com.au  Fact Sheet for Healthcare Providers:  IncredibleEmployment.be  This test is no t yet approved or cleared by the [place] FDA and  has been authorized for detection and/or diagnosis of SARS-CoV-2 by FDA under an Emergency Use Authorization (EUA). This EUA will remain  in effect (meaning this test can be used) for the duration of the COVID-19 declaration under Section 564(b)(1) of the Act, 21 U.S.C.section 360bbb-3(b)(1), unless the authorization is terminated  or revoked sooner.       Influenza A by PCR NEGATIVE NEGATIVE Final   Influenza B by PCR NEGATIVE NEGATIVE Final    Comment: (NOTE) The Xpert Xpress SARS-CoV-2/FLU/RSV plus assay is intended as an aid in the diagnosis of influenza from  Nasopharyngeal swab specimens and should not be used as a sole basis for treatment. Nasal washings and aspirates are unacceptable for Xpert Xpress SARS-CoV-2/FLU/RSV testing.  Fact Sheet for Patients: EntrepreneurPulse.com.au  Fact Sheet for Healthcare Providers: IncredibleEmployment.be  This test is not yet approved or cleared by the [place] FDA and has been authorized for detection and/or diagnosis of SARS-CoV-2 by FDA under an Emergency Use Authorization (EUA). This EUA will remain in effect (meaning this test can be used) for the duration of the COVID-19 declaration under Section 564(b)(1) of the Act, 21 U.S.C. section 360bbb-3(b)(1), unless the authorization is terminated or revoked.  Performed at [facility], [place] [address] [place], [place] [address]      Radiology Studies: EEG  Result Date: 08/28/2020 Lora Havens, MD     08/28/2020  4:08 PM Patient Name: TENA LINEBAUGH MRN: [id number] Epilepsy Attending: Lora Havens Referring  Physician/Provider: Dr. Myrene Buddy Date: 08/28/2020 Duration: 25.47 mins Patient history: 59 year old female presented with altered mental status and left-sided weakness.  EEG to evaluate for seizures. Level of alertness: Awake, drowsy AEDs during EEG study: Pregabalin Technical aspects: This EEG study was done with scalp electrodes positioned according to the 10-20 International system of electrode placement. Electrical activity was acquired at a sampling rate of 500Hz  and reviewed with a high frequency filter of 70Hz  and a low frequency filter of 1Hz . EEG data were recorded continuously and digitally stored. Description: The posterior dominant rhythm consists of 8 Hz activity of moderate voltage (25-35 uV) seen predominantly in posterior head regions, symmetric and reactive to eye opening and eye closing. Drowsiness was characterized by attenuation of the posterior background rhythm. Physiologic photic driving was  seen during photic stimulation.  Hyperventilation was not performed.   IMPRESSION: This study is within normal limits. No seizures or epileptiform discharges were seen throughout the recording. Priyanka Barbra Sarks   CT ABDOMEN PELVIS WO CONTRAST  Result Date: 08/27/2020 CLINICAL DATA:  Altered level of consciousness, unresponsive, acute abdominal pain EXAM: CT ABDOMEN AND PELVIS WITHOUT CONTRAST TECHNIQUE: Multidetector CT imaging of the abdomen and pelvis was performed following the standard protocol without IV contrast. COMPARISON:  11/18/2019 FINDINGS: Lower chest: No acute pleural or parenchymal lung disease. Hepatobiliary: No focal liver abnormality is seen. Status post cholecystectomy. No biliary dilatation. Pancreas: Unremarkable. No pancreatic ductal dilatation or surrounding inflammatory changes. Spleen: Normal in size without focal abnormality. Adrenals/Urinary Tract: Stable right renal cyst. No urinary tract calculi or obstructive uropathy. Foley catheter partially decompresses the  bladder. The adrenals are unremarkable. Stomach/Bowel: No bowel obstruction or ileus. Normal appendix right lower quadrant. Postsurgical changes from bariatric surgery. Vascular/Lymphatic: No significant vascular findings are present. No enlarged abdominal or pelvic lymph nodes. Reproductive: Status post hysterectomy. No adnexal masses. Other: No free fluid or free gas.  No abdominal wall hernia. Musculoskeletal: No acute or destructive bony lesions. Stable postsurgical changes from L4 through S1. Reconstructed images demonstrate no additional findings. IMPRESSION: 1. No acute intra-abdominal or intrapelvic process. Electronically Signed   By: Randa Ngo M.D.   On: 08/27/2020 20:06   CT Head Wo Contrast  Result Date: 08/27/2020 CLINICAL DATA:  59 year old female with delirium. EXAM: CT HEAD WITHOUT CONTRAST TECHNIQUE: Contiguous axial images were obtained from the base of the skull through the vertex without intravenous contrast. COMPARISON:  Head CT dated 06/29/2020. FINDINGS: Brain: The ventricles and sulci appropriate size for patient's age. The gray-white matter discrimination is preserved. There is no acute intracranial hemorrhage. No  mass effect or midline shift. No extra-axial fluid collection. Vascular: No hyperdense vessel or unexpected calcification. Skull: Normal. Negative for fracture or focal lesion. Sinuses/Orbits: Mild mucoperiosteal thickening of the left maxillary sinus. The remainder of the visualized paranasal sinuses and mastoid air cells are clear. Other: None IMPRESSION: Unremarkable noncontrast CT of the brain. Electronically Signed   By: Anner Crete M.D.   On: 08/27/2020 16:57   CT ANGIO CHEST PE W OR WO CONTRAST  Result Date: 08/28/2020 CLINICAL DATA:  Weakness, dizziness, elevated D-dimer, question pulmonary embolism EXAM: CT ANGIOGRAPHY CHEST WITH CONTRAST TECHNIQUE: Multidetector CT imaging of the chest was performed using the standard protocol during bolus  administration of intravenous contrast. Multiplanar CT image reconstructions and MIPs were obtained to evaluate the vascular anatomy. CONTRAST:  59mL OMNIPAQUE IOHEXOL 350 MG/ML SOLN IV COMPARISON:  11/18/2019 FINDINGS: Cardiovascular: Atherosclerotic calcification aorta, minimally in coronary arteries. Heart upper normal size. No pericardial effusion. Pulmonary arteries adequately opacified and patent. No evidence of pulmonary embolism. Mediastinum/Nodes: Small amount of high density medication within esophagus into stomach. Asymmetric prominence of the RIGHT thyroid lobe versus LEFT with a stable linear calcification. No discrete mass. No thoracic adenopathy. Lungs/Pleura: Dependent atelectasis in BILATERAL lower lobes. Tiny BILATERAL pleural effusions. Remaining lungs clear. No infiltrate or pneumothorax. Upper Abdomen: Gallbladder surgically absent. Prior gastric bypass surgery. Cyst at upper pole LEFT kidney unchanged. Musculoskeletal: Unremarkable Review of the MIP images confirms the above findings. IMPRESSION: No evidence of pulmonary embolism. Tiny BILATERAL pleural effusions with dependent atelectasis in BILATERAL lower lobes. Post cholecystectomy and gastric bypass surgery per Aortic Atherosclerosis (ICD10-I70.0). Electronically Signed   By: Lavonia Dana M.D.   On: 08/28/2020 14:50   DG Chest Portable 1 View  Result Date: 08/27/2020 CLINICAL DATA:  Altered mental status. EXAM: PORTABLE CHEST 1 VIEW COMPARISON:  05/03/2020 FINDINGS: 1400 hours. Low volume film. The lungs are clear without focal pneumonia, edema, pneumothorax or pleural effusion. Interstitial markings are diffusely coarsened with chronic features. The cardiopericardial silhouette is within normal limits for size. The visualized bony structures of the thorax show no acute abnormality. Telemetry leads overlie the chest. IMPRESSION: Chronic interstitial changes without acute cardiopulmonary findings. Electronically Signed   By: Misty Stanley M.D.   On: 08/27/2020 14:08   CT ANGIO CHEST PE W OR WO CONTRAST  Final Result    CT ABDOMEN PELVIS WO CONTRAST  Final Result    CT Head Wo Contrast  Final Result    DG Chest Portable 1 View  Final Result      Scheduled Meds: . amLODipine  5 mg Oral Daily  . aspirin EC  81 mg Oral Daily  . Chlorhexidine Gluconate Cloth  6 each Topical Daily  . DULoxetine  60 mg Oral Daily  . escitalopram  10 mg Oral QHS  . ferrous sulfate  325 mg Oral Daily  . folic acid  1 mg Oral Daily  . heparin  5,000 Units Subcutaneous Q8H  . metoprolol succinate  100 mg Oral QHS  . mirtazapine  7.5 mg Oral QHS  . pantoprazole  40 mg Oral BID  . polyethylene glycol  17 g Oral Daily  . potassium chloride SA  20 mEq Oral BID  . pregabalin  75 mg Oral TID  . Vitamin D (Ergocalciferol)  50,000 Units Oral Once per day on Mon Wed Fri   PRN Meds: acetaminophen **OR** acetaminophen, albuterol, fluticasone, nitroGLYCERIN, ondansetron **OR** ondansetron (ZOFRAN) IV Continuous Infusions: . lactated ringers 100 mL/hr at 08/29/20 2500  LOS: 1 day  Time spent: Greater than 50% of the 35 minute visit was spent in counseling/coordination of care for the patient as laid out in the A&P.   Dwyane Dee, MD [facility] 08/29/2020, 1:51 PM

## 2020-08-29 NOTE — Hospital Course (Addendum)
Jodi Bowman is a 59 y.o. 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   Patient admitted 2 months ago with generalized weakness, observed overnight on fluids, evaluated by neurology and psychiatry, discharged the next day.   Since then, recovered completely, seen by PCP without symptoms.  Then on day of this admission, had headache, vomited 6-7 times, and was at work when she felt dizzy and weak and nearly passed out.   BIB husband and patient was somnolent in the ER, got Narcan and became interactive.  Subseuqnetly found to have mild AKI.  She was initiated on fluids and sedating medications were held.  Blood pressure gradually improved and remained stable.  Mentation also cleared.  She was recommended to discuss decreasing many of her medications at her follow-up visit with her primary provider.  Notably, she was instructed that she may not require extended release pain medication, instead only warranting as needed dosing. She was evaluated by physical therapy as well and considered stable for discharging home with home health PT.

## 2020-08-30 DIAGNOSIS — N179 Acute kidney failure, unspecified: Secondary | ICD-10-CM | POA: Diagnosis not present

## 2020-08-30 LAB — BASIC METABOLIC PANEL
Anion gap: 9 (ref 5–15)
BUN: 10 mg/dL (ref 6–20)
CO2: 33 mmol/L — ABNORMAL HIGH (ref 22–32)
Calcium: 8.7 mg/dL — ABNORMAL LOW (ref 8.9–10.3)
Chloride: 106 mmol/L (ref 98–111)
Creatinine, Ser: 0.93 mg/dL (ref 0.44–1.00)
GFR, Estimated: >60 mL/min (ref >60)
Glucose, Bld: 69 mg/dL — ABNORMAL LOW (ref 70–99)
Potassium: 4.3 mmol/L (ref 3.5–5.1)
Sodium: 148 mmol/L — ABNORMAL HIGH (ref 135–145)

## 2020-08-30 LAB — CBC WITH DIFFERENTIAL/PLATELET
Abs Immature Granulocytes: 0.02 10*3/uL (ref 0.00–0.07)
Basophils Absolute: 0 10*3/uL (ref 0.0–0.1)
Basophils Relative: 0 %
Eosinophils Absolute: 0 10*3/uL (ref 0.0–0.5)
Eosinophils Relative: 1 %
HCT: 30.7 % — ABNORMAL LOW (ref 36.0–46.0)
Hemoglobin: 10.1 g/dL — ABNORMAL LOW (ref 12.0–15.0)
Immature Granulocytes: 0 %
Lymphocytes Relative: 42 %
Lymphs Abs: 3.2 10*3/uL (ref 0.7–4.0)
MCH: 29.6 pg (ref 26.0–34.0)
MCHC: 32.9 g/dL (ref 30.0–36.0)
MCV: 90 fL (ref 80.0–100.0)
Monocytes Absolute: 0.6 10*3/uL (ref 0.1–1.0)
Monocytes Relative: 7 %
Neutro Abs: 3.8 10*3/uL (ref 1.7–7.7)
Neutrophils Relative %: 50 %
Platelets: 205 10*3/uL (ref 150–400)
RBC: 3.41 MIL/uL — ABNORMAL LOW (ref 3.87–5.11)
RDW: 16.3 % — ABNORMAL HIGH (ref 11.5–15.5)
WBC: 7.7 10*3/uL (ref 4.0–10.5)
nRBC: 0 % (ref 0.0–0.2)

## 2020-08-30 LAB — MAGNESIUM: Magnesium: 1.5 mg/dL — ABNORMAL LOW (ref 1.7–2.4)

## 2020-08-30 NOTE — TOC Transition Note (Signed)
Transition of Care [facility]) - CM/SW Discharge Note   Patient Details  Name: Jodi Bowman MRN: [id number] Date of Birth: 1961/08/25  Transition of Care [facility]) CM/SW Contact:  Pollie Friar, RN Phone Number: 08/30/2020, 1:28 PM   Clinical Narrative:    Pt discharging home with [facility] services through [place]. Cory with [facility] accepted the referral.  Pt without DME needs.  Pt has supervision at home and transportation to home.    Final next level of care: Home w Home Health Services Barriers to Discharge: No Barriers Identified   Patient Goals and CMS Choice   CMS Medicare.gov Compare Post Acute Care list provided to:: Patient Choice offered to / list presented to : Patient  Discharge Placement                       Discharge Plan and Services In-house Referral: Clinical Social Work Discharge Planning Services: CM Consult Post Acute Care Choice: Skilled Nursing Facility                    HH Arranged: PT, Speech Therapy, Nurse's Aide [facility] Agency: [place] Date [facility] Agency Contacted: 08/30/20   Representative spoke with at [place]: [place] ([place]) Interventions     Readmission Risk Interventions Readmission Risk Prevention Plan 08/29/2019 03/08/2019  Transportation Screening Complete Complete  PCP or Specialist Appt within 5-7 Days - Complete  PCP or Specialist Appt within 3-5 Days Complete -  Home Care Screening - Complete  Medication Review (RN CM) - Complete  HRI or Home Care Consult Complete -  Social Work Consult for [place] Planning/Counseling Complete -  Palliative Care Screening Complete -  Medication Review Press photographer) Complete -  Some recent data might be hidden

## 2020-08-30 NOTE — Discharge Summary (Signed)
Physician Discharge Summary   Jodi Bowman MVE:[id number] DOB: 1961-05-25 DOA: 08/27/2020  PCP: Aletha Halim., PA-C  Admit date: 08/27/2020 Discharge date: 08/30/2020  Admitted From: home Disposition:  home Discharging physician: Dwyane Dee, MD  Recommendations for Outpatient Follow-up:  1. Decrease pain medications as able 2. Decrease other sedating medications as able 3. May need decrease of blood pressure medications   Patient discharged to home in Discharge Condition: stable CODE STATUS: Full Diet recommendation:  Diet Orders (From admission, onward)    Start     Ordered   08/30/20 0000  Diet - low sodium heart healthy        08/30/20 1011   08/27/20 1845  Diet Heart Room service appropriate? Yes; Fluid consistency: Thin  Diet effective now       Question Answer Comment  Room service appropriate? Yes   Fluid consistency: Thin      08/27/20 1845          Hospital Course: Jodi Bowman is a 59 y.o. 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   Patient admitted 2 months ago with generalized weakness, observed overnight on fluids, evaluated by neurology and psychiatry, discharged the next day.   Since then, recovered completely, seen by PCP without symptoms.  Then on day of this admission, had headache, vomited 6-7 times, and was at work when she felt dizzy and weak and nearly passed out.   BIB husband and patient was somnolent in the ER, got Narcan and became interactive.  Subseuqnetly found to have mild AKI.  She was initiated on fluids and sedating medications were held.  Blood pressure gradually improved and remained stable.  Mentation also cleared.  She was recommended to discuss decreasing many of her medications at her follow-up visit with her primary provider.  Notably, she was instructed that she may not require extended release pain medication, instead only  warranting as needed dosing. She was evaluated by physical therapy as well and considered stable for discharging home with home health PT.  Acute dehydration and AKI due to vomiting due to migraine Orthostasis -Orthostatic on admission; blood pressure now improved with fluids -BP meds resumed at discharge.  May need further decrease at follow-up -Renal function has improved with fluids.  Advance diet and monitor further  Somnolence and altered mental status due to opiate overdose -Continue holding Jodi Contin and hydrocodone through today if patient able to tolerate with no withdrawal symptoms -Pain meds resumed at discharge but patient instructed to discuss further titration outpatient at follow-up  Hypertension -Continued on home regimen at discharge.  Cerebrovascular disease, secondary prevention -Continue aspirin  Fibromyalgia Depression -Continue duloxetine -Continue SSRI  Anemia Hgb 11 ->9. In absence of bleeding, hematemesis or melena in history, and none noted here, presume this is dilutional.  -Trend Hgb  Elevated troponin Elevated D-dimer No evidence of chest pain, angina, exertional symptoms. Doubt ACS, troponin low and flat in setting of poor renal function, no further ischemic work-up. D-dimer elevationnonspecific. Chest CT angiogram ordered, is negative, no evidence of DVT   The patient's chronic medical conditions were treated accordingly per the patient's home medication regimen except as noted.  On day of discharge, patient was felt deemed stable for discharge. Patient/family member advised to call PCP or come back to ER if needed.   Principal Diagnosis: Acute metabolic encephalopathy  Discharge Diagnoses: Active Hospital Problems   Diagnosis Date Noted  .  Acute metabolic encephalopathy 09/32/3557  . Weakness 08/27/2020  . Chronic pain syndrome   . Fibromyalgia   . Orthostasis   . Backache 03/01/2009  . Essential hypertension 01/01/2007     Resolved Hospital Problems   Diagnosis Date Noted Date Resolved  . AKI (acute kidney injury) ([place])  08/29/2020    Discharge Instructions    Diet - low sodium heart healthy   Complete by: As directed    Increase activity slowly   Complete by: As directed      Allergies as of 08/30/2020      Reactions   Shrimp [shellfish Allergy] Anaphylaxis   Naproxen Other (See Comments)   Makes stomach cramp and burn badly      Medication List    TAKE these medications   amLODipine 5 MG tablet Commonly known as: NORVASC Take 5 mg by mouth daily.   aspirin EC 81 MG tablet Take 81 mg by mouth daily.   D3-50 1.25 MG (50000 UT) capsule Generic drug: Cholecalciferol Take 50,000 Units by mouth 3 (three) times a week.   diclofenac Sodium 1 % Gel Commonly known as: VOLTAREN Apply 4 g topically 2 (two) times daily as needed (back pain).   DULoxetine 60 MG capsule Commonly known as: CYMBALTA Take 60 mg by mouth daily.   escitalopram 10 MG tablet Commonly known as: LEXAPRO Take 10 mg by mouth at bedtime.   ferrous sulfate 325 (65 FE) MG EC tablet Take 325 mg by mouth daily.   fluticasone 50 MCG/ACT nasal spray Commonly known as: FLONASE Place 1 spray into both nostrils daily as needed for allergies.   folic acid 1 MG tablet Commonly known as: FOLVITE Take 1 mg by mouth daily.   HYDROcodone-acetaminophen 10-325 MG tablet Commonly known as: NORCO Take 1 tablet by mouth 5 (five) times daily as needed for moderate pain or severe pain.   isosorbide mononitrate 30 MG 24 hr tablet Commonly known as: IMDUR Take 1 tablet (30 mg total) by mouth daily.   loratadine 10 MG tablet Commonly known as: CLARITIN Take 10 mg by mouth daily.   losartan-hydrochlorothiazide 100-12.5 MG tablet Commonly known as: HYZAAR Take 1 tablet by mouth daily.   Magnesium Oxide 400 MG Caps Take 1 capsule (400 mg total) by mouth daily.   metoprolol succinate 100 MG 24 hr tablet Commonly known as:  TOPROL-XL Take 100 mg by mouth at bedtime.   mirtazapine 7.5 MG tablet Commonly known as: REMERON Take 7.5 mg by mouth at bedtime.   morphine 30 MG 12 hr tablet Commonly known as: Jodi CONTIN Take 30 mg by mouth every 12 (twelve) hours.   nitroGLYCERIN 0.4 MG SL tablet Commonly known as: NITROSTAT Place 1 tablet (0.4 mg total) under the tongue every 5 (five) minutes x 3 doses as needed for chest pain. What changed:   when to take this  reasons to take this   ondansetron 4 MG disintegrating tablet Commonly known as: ZOFRAN-ODT Take 4 mg by mouth every 8 (eight) hours as needed for nausea/vomiting.   pantoprazole 40 MG tablet Commonly known as: PROTONIX Take 1 tablet (40 mg total) by mouth 2 (two) times daily.   polyethylene glycol 17 g packet Commonly known as: MIRALAX / GLYCOLAX Take 17 g by mouth daily as needed for mild constipation.   potassium chloride SA 20 MEQ tablet Commonly known as: KLOR-CON Take 1 tablet (20 mEq total) by mouth 2 (two) times daily.   pregabalin 75 MG capsule Commonly known as:  Lyrica Take 1 capsule (75 mg total) by mouth 3 (three) times daily.   Proventil HFA 108 (90 Base) MCG/ACT inhaler Generic drug: albuterol Inhale 1-2 puffs into the lungs every 6 (six) hours as needed for wheezing or shortness of breath.       Allergies  Allergen Reactions  . Shrimp [Shellfish Allergy] Anaphylaxis  . Naproxen Other (See Comments)    Makes stomach cramp and burn badly    Discharge Exam: BP 117/66 (BP Location: Left Arm)   Pulse 69   Temp 98.2 F (36.8 C) (Oral)   Resp 18   Ht 4\' 11"  (1.499 m)   Wt 64 kg   SpO2 100%   BMI 28.50 kg/m  General appearance: Slightly lethargic but overall awake and alert Head: Normocephalic, without obvious abnormality, atraumatic Eyes: EOMI Lungs: clear to auscultation bilaterally Heart: regular rate and rhythm and S1, S2 normal Abdomen: normal findings: bowel sounds normal and soft,  non-tender Extremities: No edema Skin: mobility and turgor normal Neurologic: Moves all 4 extremities  The results of significant diagnostics from this hospitalization (including imaging, microbiology, ancillary and laboratory) are listed below for reference.   Microbiology: Recent Results (from the past 240 hour(s))  Resp Panel by RT-PCR (Flu A&B, Covid) Nasopharyngeal Swab     Status: None   Collection Time: 08/27/20  6:21 PM   Specimen: Nasopharyngeal Swab; Nasopharyngeal(NP) swabs in vial transport medium  Result Value Ref Range Status   SARS Coronavirus 2 by RT PCR NEGATIVE NEGATIVE Final    Comment: (NOTE) SARS-CoV-2 target nucleic acids are NOT DETECTED.  The SARS-CoV-2 RNA is generally detectable in upper respiratory specimens during the acute phase of infection. The lowest concentration of SARS-CoV-2 viral copies this assay can detect is 138 copies/mL. A negative result does not preclude SARS-Cov-2 infection and should not be used as the sole basis for treatment or other patient management decisions. A negative result may occur with  improper specimen collection/handling, submission of specimen other than nasopharyngeal swab, presence of viral mutation(s) within the areas targeted by this assay, and inadequate number of viral copies(<138 copies/mL). A negative result must be combined with clinical observations, patient history, and epidemiological information. The expected result is Negative.  Fact Sheet for Patients:  EntrepreneurPulse.com.au  Fact Sheet for Healthcare Providers:  IncredibleEmployment.be  This test is no t yet approved or cleared by the [place] FDA and  has been authorized for detection and/or diagnosis of SARS-CoV-2 by FDA under an Emergency Use Authorization (EUA). This EUA will remain  in effect (meaning this test can be used) for the duration of the COVID-19 declaration under Section 564(b)(1) of the Act,  21 U.S.C.section 360bbb-3(b)(1), unless the authorization is terminated  or revoked sooner.       Influenza A by PCR NEGATIVE NEGATIVE Final   Influenza B by PCR NEGATIVE NEGATIVE Final    Comment: (NOTE) The Xpert Xpress SARS-CoV-2/FLU/RSV plus assay is intended as an aid in the diagnosis of influenza from Nasopharyngeal swab specimens and should not be used as a sole basis for treatment. Nasal washings and aspirates are unacceptable for Xpert Xpress SARS-CoV-2/FLU/RSV testing.  Fact Sheet for Patients: EntrepreneurPulse.com.au  Fact Sheet for Healthcare Providers: IncredibleEmployment.be  This test is not yet approved or cleared by the [place] FDA and has been authorized for detection and/or diagnosis of SARS-CoV-2 by FDA under an Emergency Use Authorization (EUA). This EUA will remain in effect (meaning this test can be used) for the duration of the COVID-19  declaration under Section 564(b)(1) of the Act, 21 U.S.C. section 360bbb-3(b)(1), unless the authorization is terminated or revoked.  Performed at [facility], [place] [address], [place], [place] [address]      Labs: BNP (last 3 results) Recent Labs    09/28/19 1349 03/08/20 2015  BNP 124.8* 53.6   Basic Metabolic Panel: Recent Labs  Lab 08/27/20 1250 08/28/20 0350 08/29/20 0229 08/30/20 0435  NA 145 141 145 148*  K 3.3* 3.0* 4.1 4.3  CL 103 102 103 106  CO2 30 33* 35* 33*  GLUCOSE 121* 62* 69* 69*  BUN 18 13 10 10   CREATININE 1.53* 1.01* 0.87 0.93  CALCIUM 8.5* 7.9* 8.1* 8.7*  MG  --   --   --  1.5*   Liver Function Tests: Recent Labs  Lab 08/27/20 1250 08/29/20 0229  AST 50* 31  ALT 34 23  ALKPHOS 87 65  BILITOT 0.6 0.5  PROT 5.7* 4.1*  ALBUMIN 2.9* 1.9*   No results for input(s): LIPASE, AMYLASE in the last 168 hours. Recent Labs  Lab 08/27/20 2017  AMMONIA 38*   CBC: Recent Labs  Lab 08/27/20 1250 08/28/20 0350 08/29/20 0229  08/30/20 0435  WBC 10.6* 7.7 6.9 7.7  NEUTROABS  --   --   --  3.8  HGB 11.5* 9.5* 9.4* 10.1*  HCT 35.8* 29.3* 28.0* 30.7*  MCV 91.8 90.2 88.9 90.0  PLT 234 203 204 205   Cardiac Enzymes: No results for input(s): CKTOTAL, CKMB, CKMBINDEX, TROPONINI in the last 168 hours. BNP: Invalid input(s): POCBNP CBG: Recent Labs  Lab 08/27/20 1252 08/27/20 1515  GLUCAP 59* 116*   D-Dimer Recent Labs    08/28/20 1146  DDIMER 0.78*   Hgb A1c No results for input(s): HGBA1C in the last 72 hours. Lipid Profile No results for input(s): CHOL, HDL, LDLCALC, TRIG, CHOLHDL, LDLDIRECT in the last 72 hours. Thyroid function studies Recent Labs    08/27/20 2017  TSH 0.909   Anemia work up Recent Labs    08/27/20 2017  VITAMINB12 431   Urinalysis    Component Value Date/Time   COLORURINE YELLOW 08/27/2020 [place] 08/27/2020 1835   LABSPEC 1.011 08/27/2020 1835   PHURINE 5.0 08/27/2020 1835   GLUCOSEU NEGATIVE 08/27/2020 [place] 08/27/2020 1835   HGBUR negative 03/01/2009 0853   BILIRUBINUR NEGATIVE 08/27/2020 1835   KETONESUR NEGATIVE 08/27/2020 [place] 08/27/2020 1835   UROBILINOGEN 1.0 10/09/2014 2018   NITRITE NEGATIVE 08/27/2020 1835   LEUKOCYTESUR NEGATIVE 08/27/2020 1835   Sepsis Labs Invalid input(s): PROCALCITONIN,  WBC,  LACTICIDVEN Microbiology Recent Results (from the past 240 hour(s))  Resp Panel by RT-PCR (Flu A&B, Covid) Nasopharyngeal Swab     Status: None   Collection Time: 08/27/20  6:21 PM   Specimen: Nasopharyngeal Swab; Nasopharyngeal(NP) swabs in vial transport medium  Result Value Ref Range Status   SARS Coronavirus 2 by RT PCR NEGATIVE NEGATIVE Final    Comment: (NOTE) SARS-CoV-2 target nucleic acids are NOT DETECTED.  The SARS-CoV-2 RNA is generally detectable in upper respiratory specimens during the acute phase of infection. The lowest concentration of SARS-CoV-2 viral copies this assay can  detect is 138 copies/mL. A negative result does not preclude SARS-Cov-2 infection and should not be used as the sole basis for treatment or other patient management decisions. A negative result may occur with  improper specimen collection/handling, submission of specimen other than nasopharyngeal swab, presence of  viral mutation(s) within the areas targeted by this assay, and inadequate number of viral copies(<138 copies/mL). A negative result must be combined with clinical observations, patient history, and epidemiological information. The expected result is Negative.  Fact Sheet for Patients:  EntrepreneurPulse.com.au  Fact Sheet for Healthcare Providers:  IncredibleEmployment.be  This test is no t yet approved or cleared by the [place] FDA and  has been authorized for detection and/or diagnosis of SARS-CoV-2 by FDA under an Emergency Use Authorization (EUA). This EUA will remain  in effect (meaning this test can be used) for the duration of the COVID-19 declaration under Section 564(b)(1) of the Act, 21 U.S.C.section 360bbb-3(b)(1), unless the authorization is terminated  or revoked sooner.       Influenza A by PCR NEGATIVE NEGATIVE Final   Influenza B by PCR NEGATIVE NEGATIVE Final    Comment: (NOTE) The Xpert Xpress SARS-CoV-2/FLU/RSV plus assay is intended as an aid in the diagnosis of influenza from Nasopharyngeal swab specimens and should not be used as a sole basis for treatment. Nasal washings and aspirates are unacceptable for Xpert Xpress SARS-CoV-2/FLU/RSV testing.  Fact Sheet for Patients: EntrepreneurPulse.com.au  Fact Sheet for Healthcare Providers: IncredibleEmployment.be  This test is not yet approved or cleared by the [place] FDA and has been authorized for detection and/or diagnosis of SARS-CoV-2 by FDA under an Emergency Use Authorization (EUA). This EUA will remain in  effect (meaning this test can be used) for the duration of the COVID-19 declaration under Section 564(b)(1) of the Act, 21 U.S.C. section 360bbb-3(b)(1), unless the authorization is terminated or revoked.  Performed at [facility], [place] [address], [place], [place] [address]     Procedures/Studies: EEG  Result Date: 08/28/2020 Lora Havens, MD     08/28/2020  4:08 PM Patient Name: THURSA EMME MRN: [id number] Epilepsy Attending: Lora Havens Referring Physician/Provider: Dr. Myrene Buddy Date: 08/28/2020 Duration: 25.47 mins Patient history: 59 year old female presented with altered mental status and left-sided weakness.  EEG to evaluate for seizures. Level of alertness: Awake, drowsy AEDs during EEG study: Pregabalin Technical aspects: This EEG study was done with scalp electrodes positioned according to the 10-20 International system of electrode placement. Electrical activity was acquired at a sampling rate of 500Hz  and reviewed with a high frequency filter of 70Hz  and a low frequency filter of 1Hz . EEG data were recorded continuously and digitally stored. Description: The posterior dominant rhythm consists of 8 Hz activity of moderate voltage (25-35 uV) seen predominantly in posterior head regions, symmetric and reactive to eye opening and eye closing. Drowsiness was characterized by attenuation of the posterior background rhythm. Physiologic photic driving was  seen during photic stimulation.  Hyperventilation was not performed.   IMPRESSION: This study is within normal limits. No seizures or epileptiform discharges were seen throughout the recording. Priyanka Barbra Sarks   CT ABDOMEN PELVIS WO CONTRAST  Result Date: 08/27/2020 CLINICAL DATA:  Altered level of consciousness, unresponsive, acute abdominal pain EXAM: CT ABDOMEN AND PELVIS WITHOUT CONTRAST TECHNIQUE: Multidetector CT imaging of the abdomen and pelvis was performed following the standard protocol without IV  contrast. COMPARISON:  11/18/2019 FINDINGS: Lower chest: No acute pleural or parenchymal lung disease. Hepatobiliary: No focal liver abnormality is seen. Status post cholecystectomy. No biliary dilatation. Pancreas: Unremarkable. No pancreatic ductal dilatation or surrounding inflammatory changes. Spleen: Normal in size without focal abnormality. Adrenals/Urinary Tract: Stable right renal cyst. No urinary tract calculi or obstructive uropathy. Foley catheter partially decompresses the bladder. The  adrenals are unremarkable. Stomach/Bowel: No bowel obstruction or ileus. Normal appendix right lower quadrant. Postsurgical changes from bariatric surgery. Vascular/Lymphatic: No significant vascular findings are present. No enlarged abdominal or pelvic lymph nodes. Reproductive: Status post hysterectomy. No adnexal masses. Other: No free fluid or free gas.  No abdominal wall hernia. Musculoskeletal: No acute or destructive bony lesions. Stable postsurgical changes from L4 through S1. Reconstructed images demonstrate no additional findings. IMPRESSION: 1. No acute intra-abdominal or intrapelvic process. Electronically Signed   By: Randa Ngo M.D.   On: 08/27/2020 20:06   CT Head Wo Contrast  Result Date: 08/27/2020 CLINICAL DATA:  59 year old female with delirium. EXAM: CT HEAD WITHOUT CONTRAST TECHNIQUE: Contiguous axial images were obtained from the base of the skull through the vertex without intravenous contrast. COMPARISON:  Head CT dated 06/29/2020. FINDINGS: Brain: The ventricles and sulci appropriate size for patient's age. The gray-white matter discrimination is preserved. There is no acute intracranial hemorrhage. No mass effect or midline shift. No extra-axial fluid collection. Vascular: No hyperdense vessel or unexpected calcification. Skull: Normal. Negative for fracture or focal lesion. Sinuses/Orbits: Mild mucoperiosteal thickening of the left maxillary sinus. The remainder of the visualized  paranasal sinuses and mastoid air cells are clear. Other: None IMPRESSION: Unremarkable noncontrast CT of the brain. Electronically Signed   By: Anner Crete M.D.   On: 08/27/2020 16:57   CT ANGIO CHEST PE W OR WO CONTRAST  Result Date: 08/28/2020 CLINICAL DATA:  Weakness, dizziness, elevated D-dimer, question pulmonary embolism EXAM: CT ANGIOGRAPHY CHEST WITH CONTRAST TECHNIQUE: Multidetector CT imaging of the chest was performed using the standard protocol during bolus administration of intravenous contrast. Multiplanar CT image reconstructions and MIPs were obtained to evaluate the vascular anatomy. CONTRAST:  12mL OMNIPAQUE IOHEXOL 350 MG/ML SOLN IV COMPARISON:  11/18/2019 FINDINGS: Cardiovascular: Atherosclerotic calcification aorta, minimally in coronary arteries. Heart upper normal size. No pericardial effusion. Pulmonary arteries adequately opacified and patent. No evidence of pulmonary embolism. Mediastinum/Nodes: Small amount of high density medication within esophagus into stomach. Asymmetric prominence of the RIGHT thyroid lobe versus LEFT with a stable linear calcification. No discrete mass. No thoracic adenopathy. Lungs/Pleura: Dependent atelectasis in BILATERAL lower lobes. Tiny BILATERAL pleural effusions. Remaining lungs clear. No infiltrate or pneumothorax. Upper Abdomen: Gallbladder surgically absent. Prior gastric bypass surgery. Cyst at upper pole LEFT kidney unchanged. Musculoskeletal: Unremarkable Review of the MIP images confirms the above findings. IMPRESSION: No evidence of pulmonary embolism. Tiny BILATERAL pleural effusions with dependent atelectasis in BILATERAL lower lobes. Post cholecystectomy and gastric bypass surgery per Aortic Atherosclerosis (ICD10-I70.0). Electronically Signed   By: Lavonia Dana M.D.   On: 08/28/2020 14:50   DG Chest Portable 1 View  Result Date: 08/27/2020 CLINICAL DATA:  Altered mental status. EXAM: PORTABLE CHEST 1 VIEW COMPARISON:  05/03/2020  FINDINGS: 1400 hours. Low volume film. The lungs are clear without focal pneumonia, edema, pneumothorax or pleural effusion. Interstitial markings are diffusely coarsened with chronic features. The cardiopericardial silhouette is within normal limits for size. The visualized bony structures of the thorax show no acute abnormality. Telemetry leads overlie the chest. IMPRESSION: Chronic interstitial changes without acute cardiopulmonary findings. Electronically Signed   By: Misty Stanley M.D.   On: 08/27/2020 14:08     Time coordinating discharge: Over 30 minutes    Dwyane Dee, MD  [facility] 08/30/2020, 2:37 PM

## 2020-08-30 NOTE — Progress Notes (Signed)
Discharge instructions were given to the patient and she stated understanding.

## 2020-08-30 NOTE — Progress Notes (Signed)
Physical Therapy Treatment Patient Details Name: Jodi Bowman MRN: [id number] DOB: 09-12-1961 Today's Date: 08/30/2020    History of Present Illness Pt is a 59 y/o female admitted secondary to change in mental status and weakness, likely from opioid overdose. PMH includes HTN, CVA, and fibromyalgia.     PT Comments    Pt tolerates treatment well with increased ambulation tolerance this session. Pt continues to demonstrate improved balance and good management of RW during sessions. Pt reports she feels very close to her baseline and is eager to discharge home. Pt will benefit from HHPT at the time of discharge to continue progressing upon deficits in activity tolerance and dynamic balance.   Follow Up Recommendations  Home health PT;Supervision - Intermittent     Equipment Recommendations  None recommended by PT    Recommendations for Other Services       Precautions / Restrictions Precautions Precautions: Fall Restrictions Weight Bearing Restrictions: No    Mobility  Bed Mobility Overal bed mobility: Modified Independent Bed Mobility: Supine to Sit     Supine to sit: Modified independent (Device/Increase time)        Transfers Overall transfer level: Modified independent Equipment used: Rolling walker (2 wheeled) Transfers: Sit to/from Stand Sit to Stand: Modified independent (Device/Increase time)            Ambulation/Gait Ambulation/Gait assistance: Supervision Gait Distance (Feet): 200 Feet Assistive device: Rolling walker (2 wheeled) Gait Pattern/deviations: Step-to pattern Gait velocity: reduced Gait velocity interpretation: <1.8 ft/sec, indicate of risk for recurrent falls General Gait Details: pt with slowed step-to gait, no significant gait or balance deviations noted at this time   Chief Strategy Officer    Modified Rankin (Stroke Patients Only)       Balance Overall balance assessment: Needs  assistance Sitting-balance support: No upper extremity supported;Feet supported Sitting balance-Leahy Scale: Good     Standing balance support: No upper extremity supported;During functional activity Standing balance-Leahy Scale: Good                              Cognition Arousal/Alertness: Awake/alert Behavior During Therapy: WFL for tasks assessed/performed Overall Cognitive Status: Within Functional Limits for tasks assessed                                        Exercises      General Comments General comments (skin integrity, edema, etc.): VSS on RA      Pertinent Vitals/Pain Pain Assessment: Faces Faces Pain Scale: Hurts a little bit Pain Location: back Pain Descriptors / Indicators: Grimacing Pain Intervention(s): Monitored during session    Home Living                      Prior Function            PT Goals (current goals can now be found in the care plan section) Acute Rehab PT Goals Patient Stated Goal: to go home Progress towards PT goals: Progressing toward goals    Frequency    Min 3X/week      PT Plan Current plan remains appropriate    Co-evaluation              AM-PAC PT "6 Clicks" Mobility   Outcome Measure  Help needed  turning from your back to your side while in a flat bed without using bedrails?: None Help needed moving from lying on your back to sitting on the side of a flat bed without using bedrails?: None Help needed moving to and from a bed to a chair (including a wheelchair)?: None Help needed standing up from a chair using your arms (e.g., wheelchair or bedside chair)?: None Help needed to walk in hospital room?: None Help needed climbing 3-5 steps with a railing? : A Little 6 Click Score: 23    End of Session   Activity Tolerance: Patient tolerated treatment well Patient left: in chair;with call bell/phone within reach;with chair alarm set Nurse Communication: Mobility  status PT Visit Diagnosis: Unsteadiness on feet (R26.81);Muscle weakness (generalized) (M62.81)     Time: 7342-8768 PT Time Calculation (min) (ACUTE ONLY): 16 min  Charges:  $Gait Training: 8-22 mins                     Zenaida Niece, PT, DPT Acute Rehabilitation Pager: [phone]    Zenaida Niece 08/30/2020, 11:49 AM

## 2020-10-19 IMAGING — DX DG CHEST 2V
2 series · 2 of 2 positions shown · non-contrast
Comparison: 08/25/2019

CLINICAL DATA: Chest pain

EXAM:
CHEST - 2 VIEW

[chest lat]
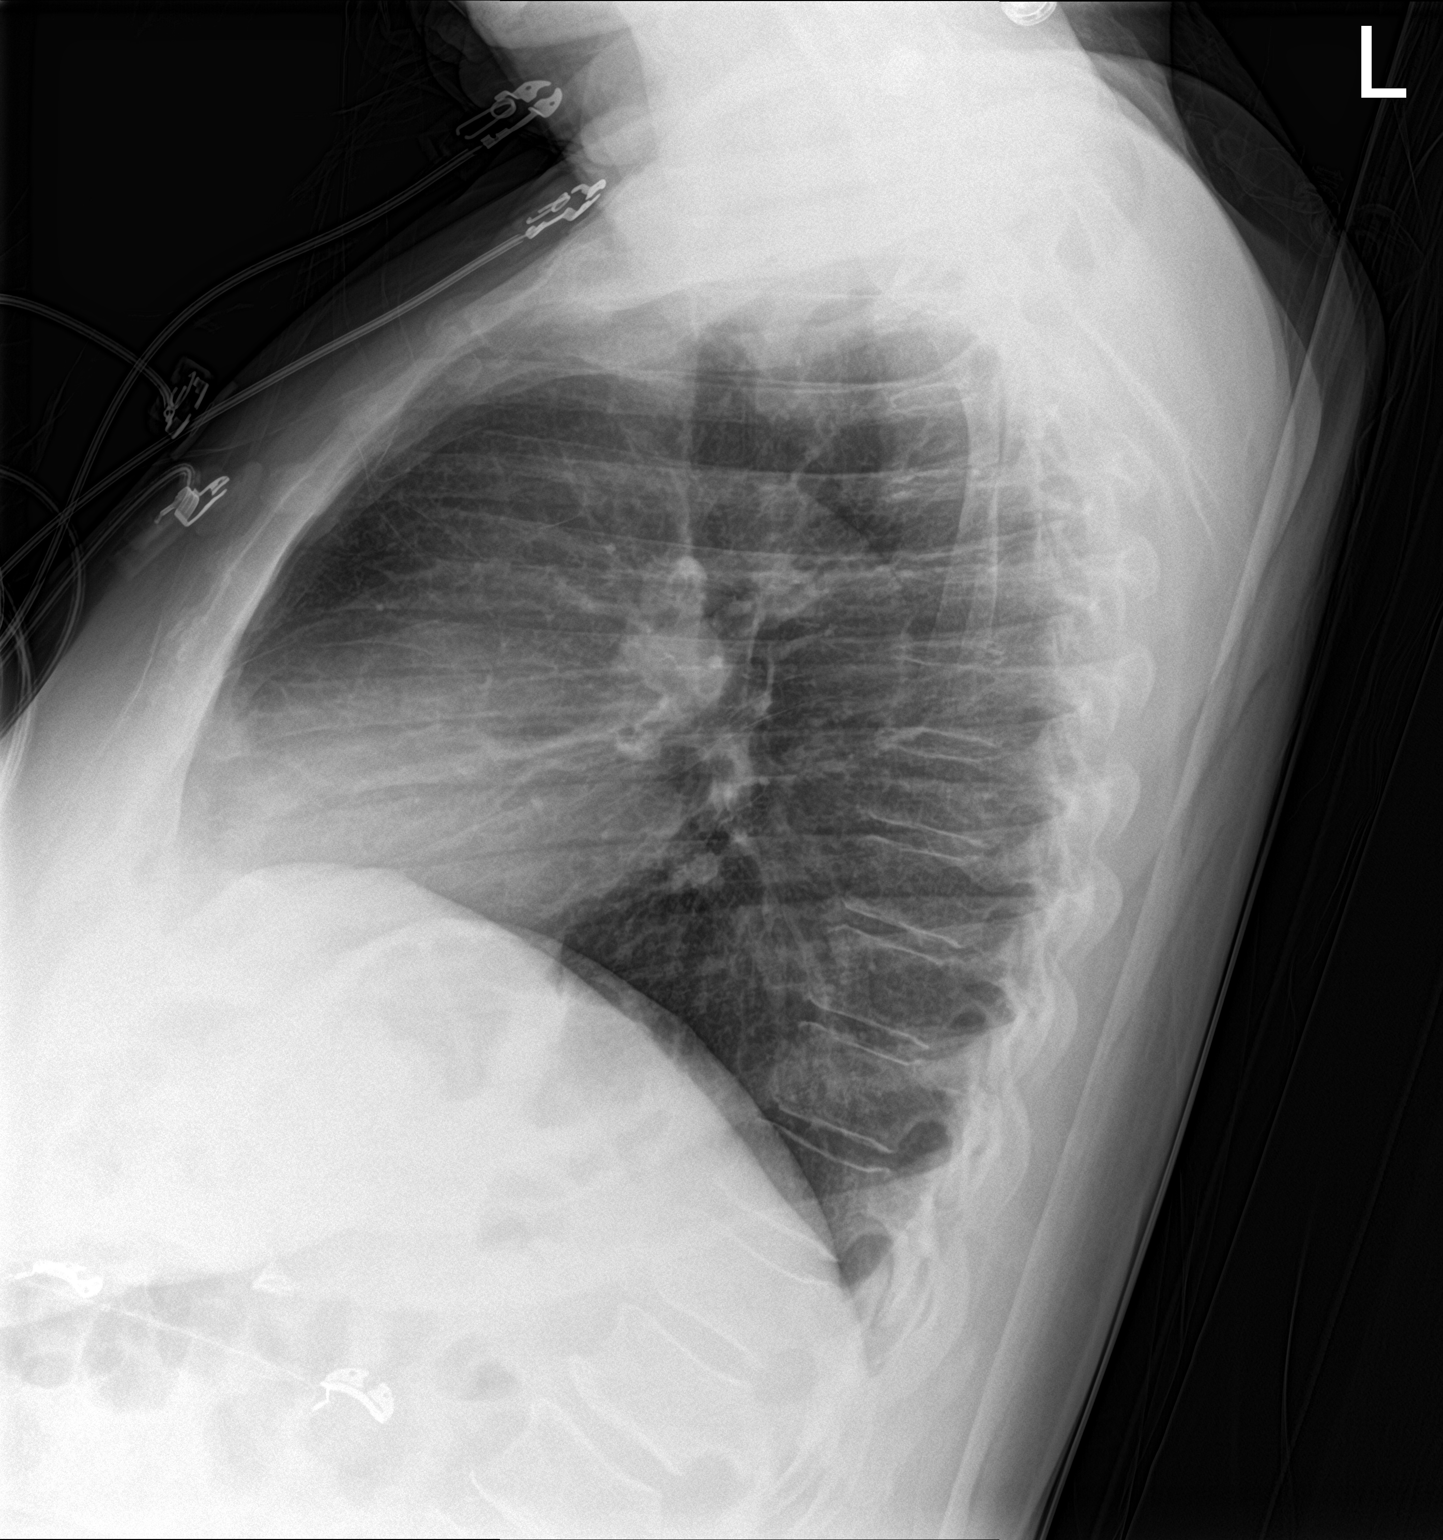

[chest ap]
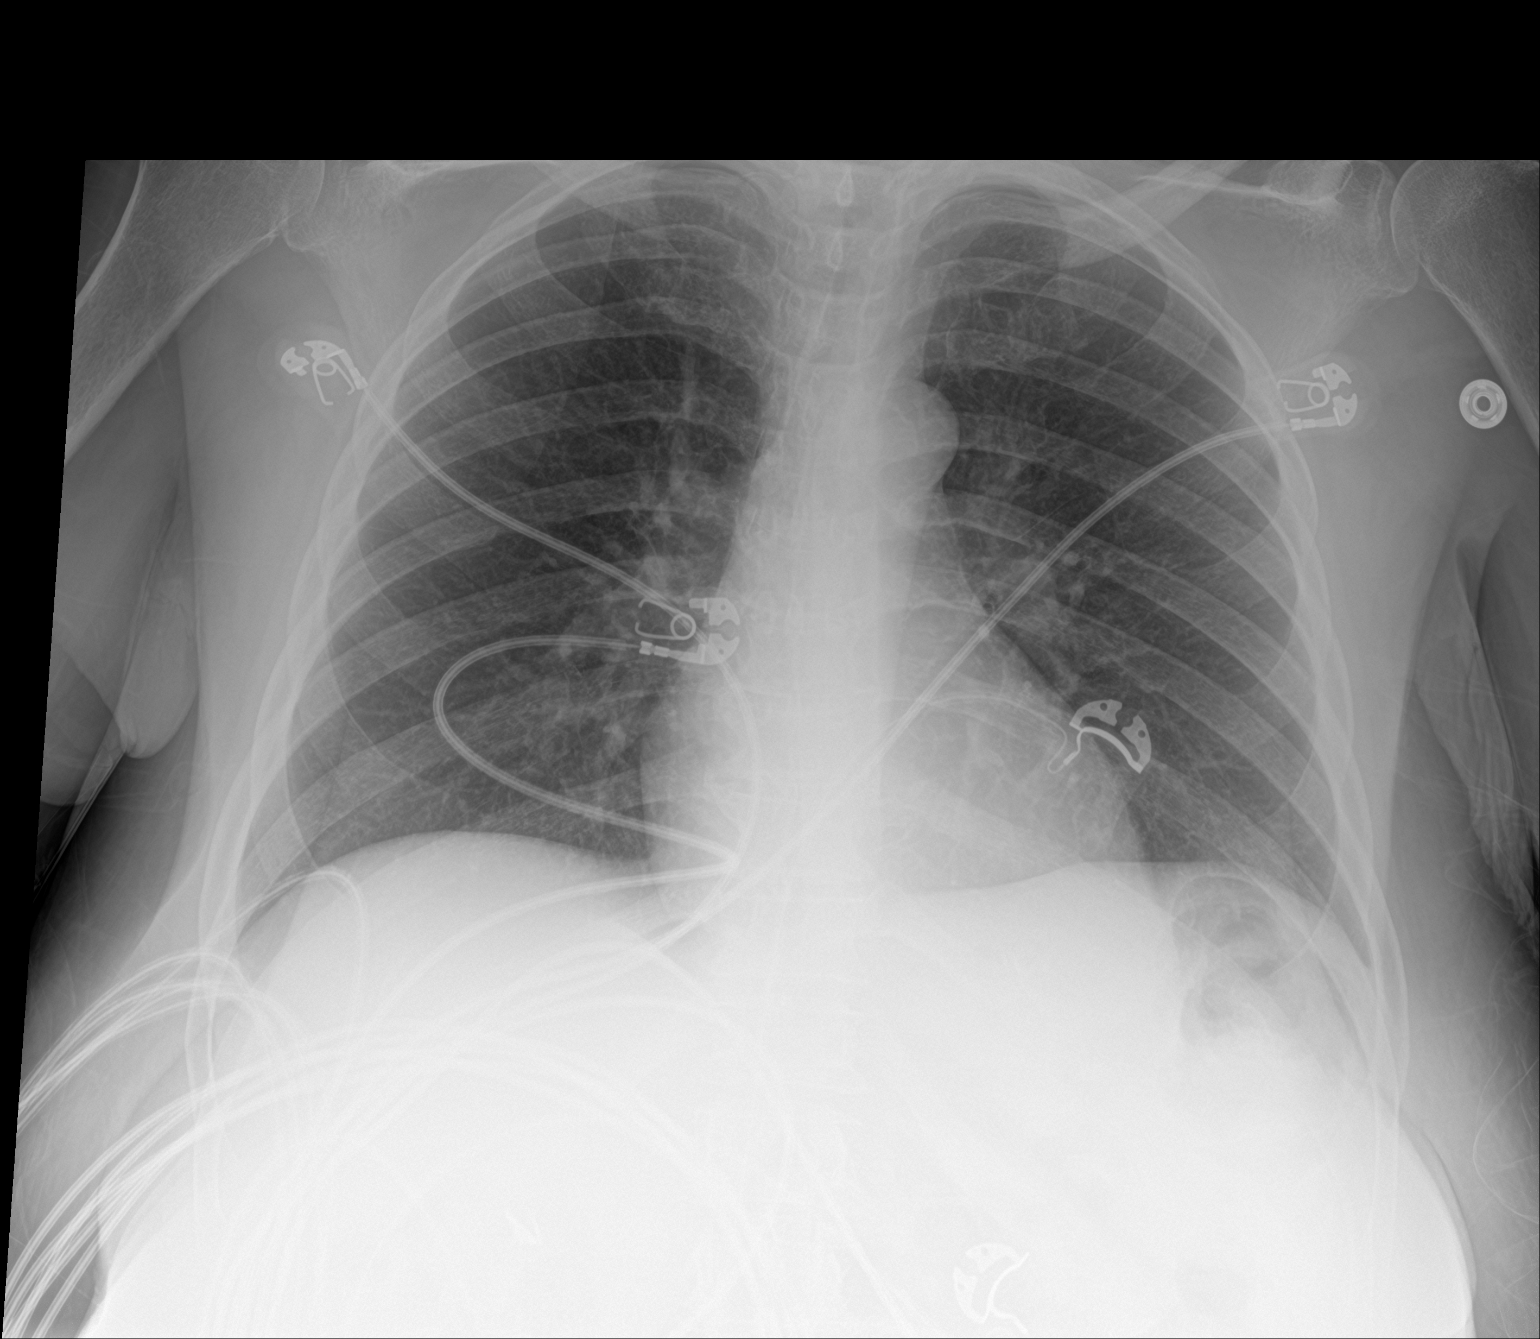

[2 of 2 positions shown; findings below may reference images not displayed]

FINDINGS: Normal heart size and mediastinal contours. No acute infiltrate or
edema. No effusion or pneumothorax. No acute osseous findings.
Artifact from EKG leads.
IMPRESSION: No active cardiopulmonary disease.

## 2021-02-07 IMAGING — CR DG FOOT COMPLETE 3+V*L*
3 series · 3 of 3 positions shown · non-contrast
Comparison: 02/27/2014

CLINICAL DATA: Left ankle pain and swelling for 3 days

EXAM:
LEFT FOOT - COMPLETE 3+ VIEW; LEFT ANKLE COMPLETE - 3+ VIEW

[foot ap]
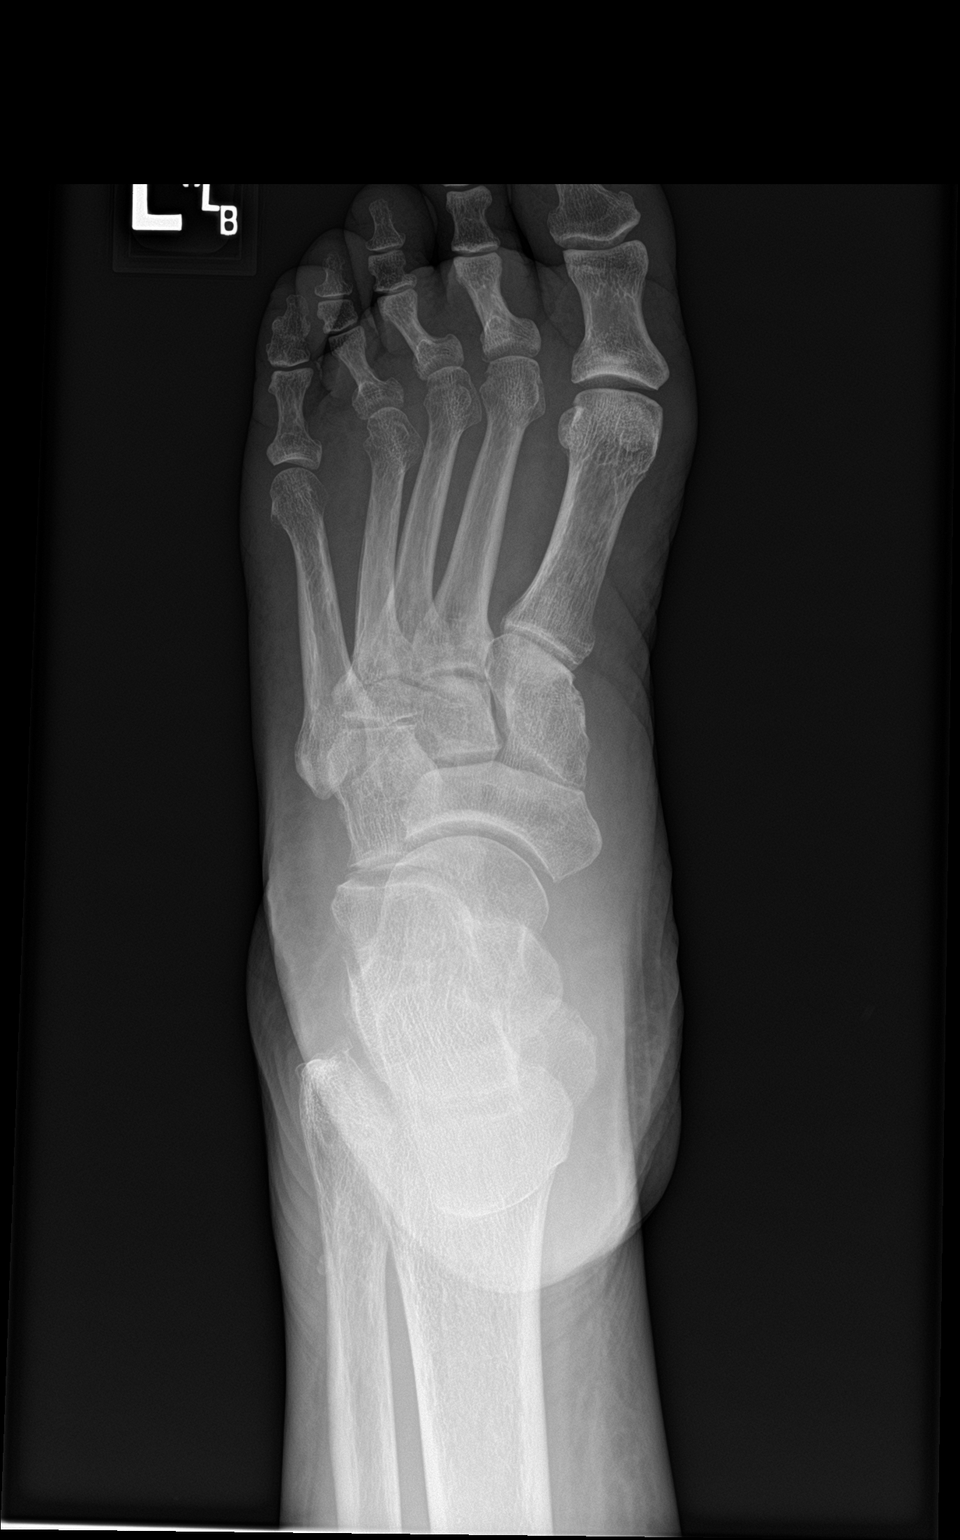

[foot obl]
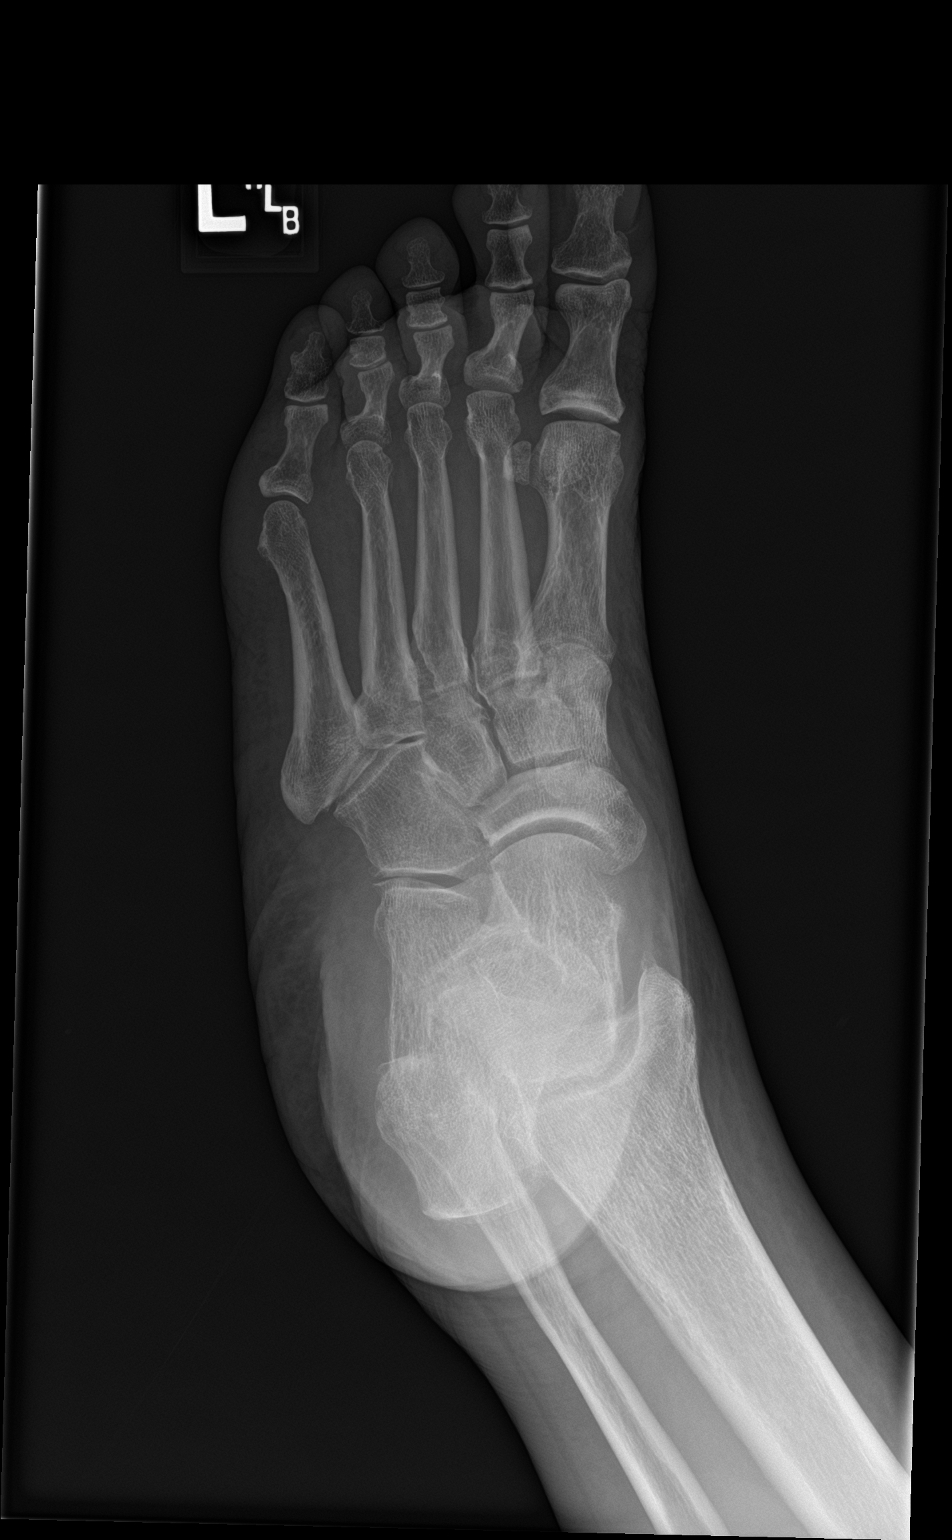

[foot lat]
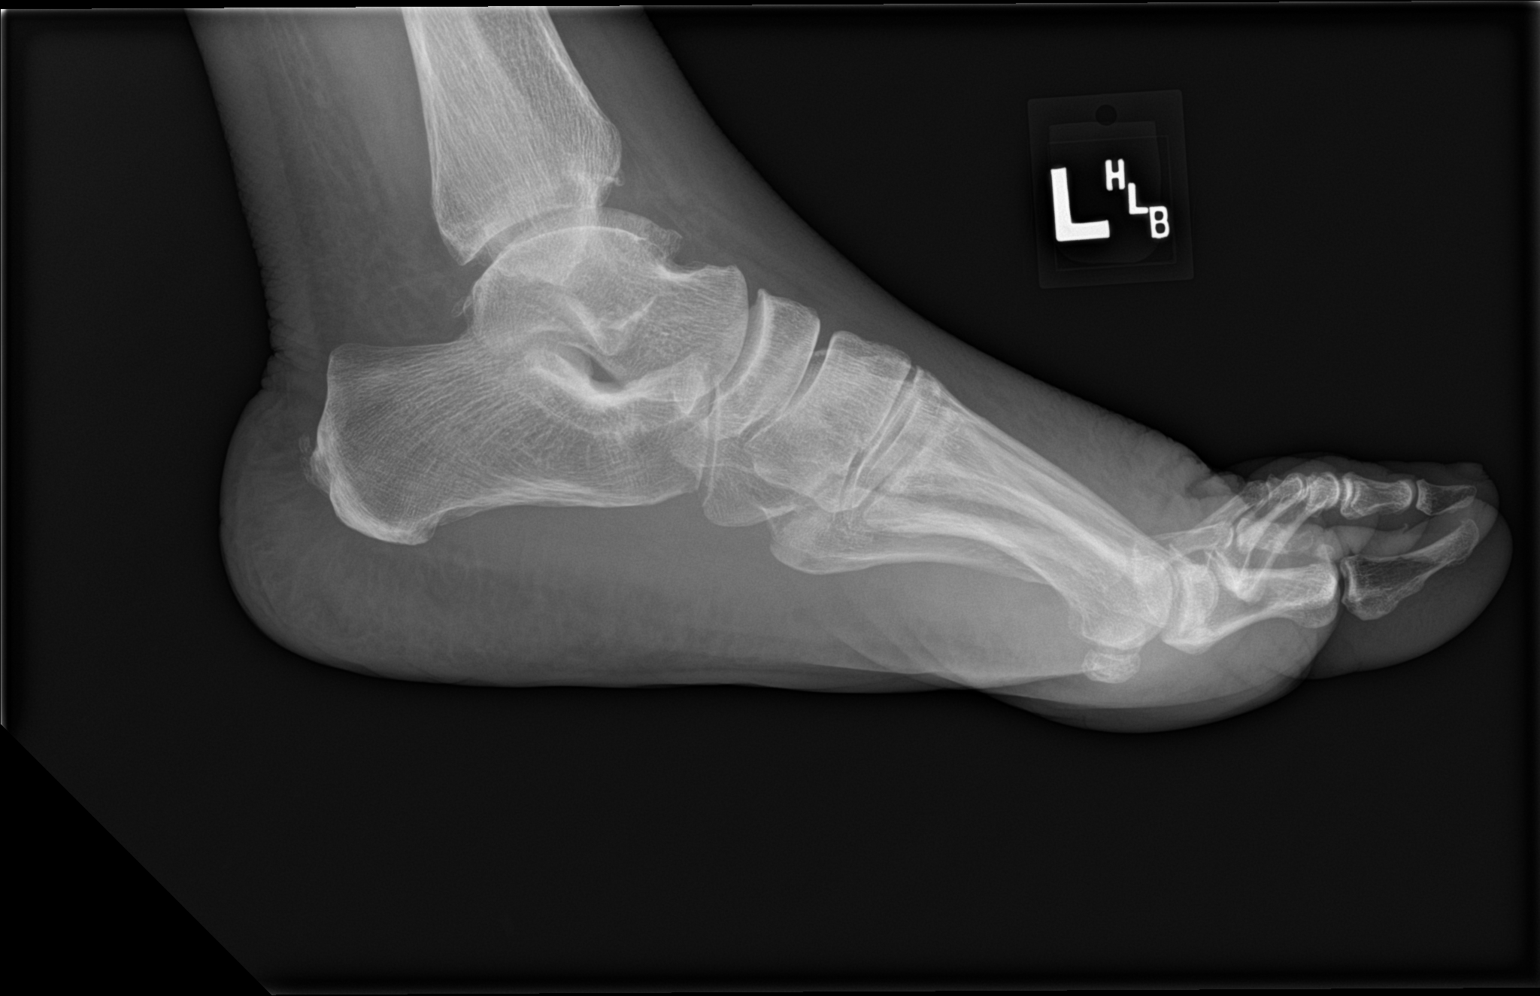

[3 of 3 positions shown; findings below may reference images not displayed]

FINDINGS: Left ankle: Frontal, oblique, and lateral views of the left ankle
demonstrate no fracture, subluxation, or dislocation. Mild
tibiotalar osteoarthritis with joint space narrowing and osteophyte
formation. There is mild diffuse soft tissue edema.

Left foot: Frontal, oblique, and lateral views demonstrate no
fracture, subluxation, or dislocation. Joint spaces are well
preserved. Mild diffuse soft tissue edema.
IMPRESSION: 1. Diffuse soft tissue edema of the left foot and ankle.
2. Left ankle osteoarthritis.
3. No acute bony abnormality.

## 2021-02-07 IMAGING — CR DG ANKLE COMPLETE 3+V*L*
3 series · 3 of 3 positions shown · non-contrast
Comparison: 02/27/2014

CLINICAL DATA: Left ankle pain and swelling for 3 days

EXAM:
LEFT FOOT - COMPLETE 3+ VIEW; LEFT ANKLE COMPLETE - 3+ VIEW

[ankle ap]
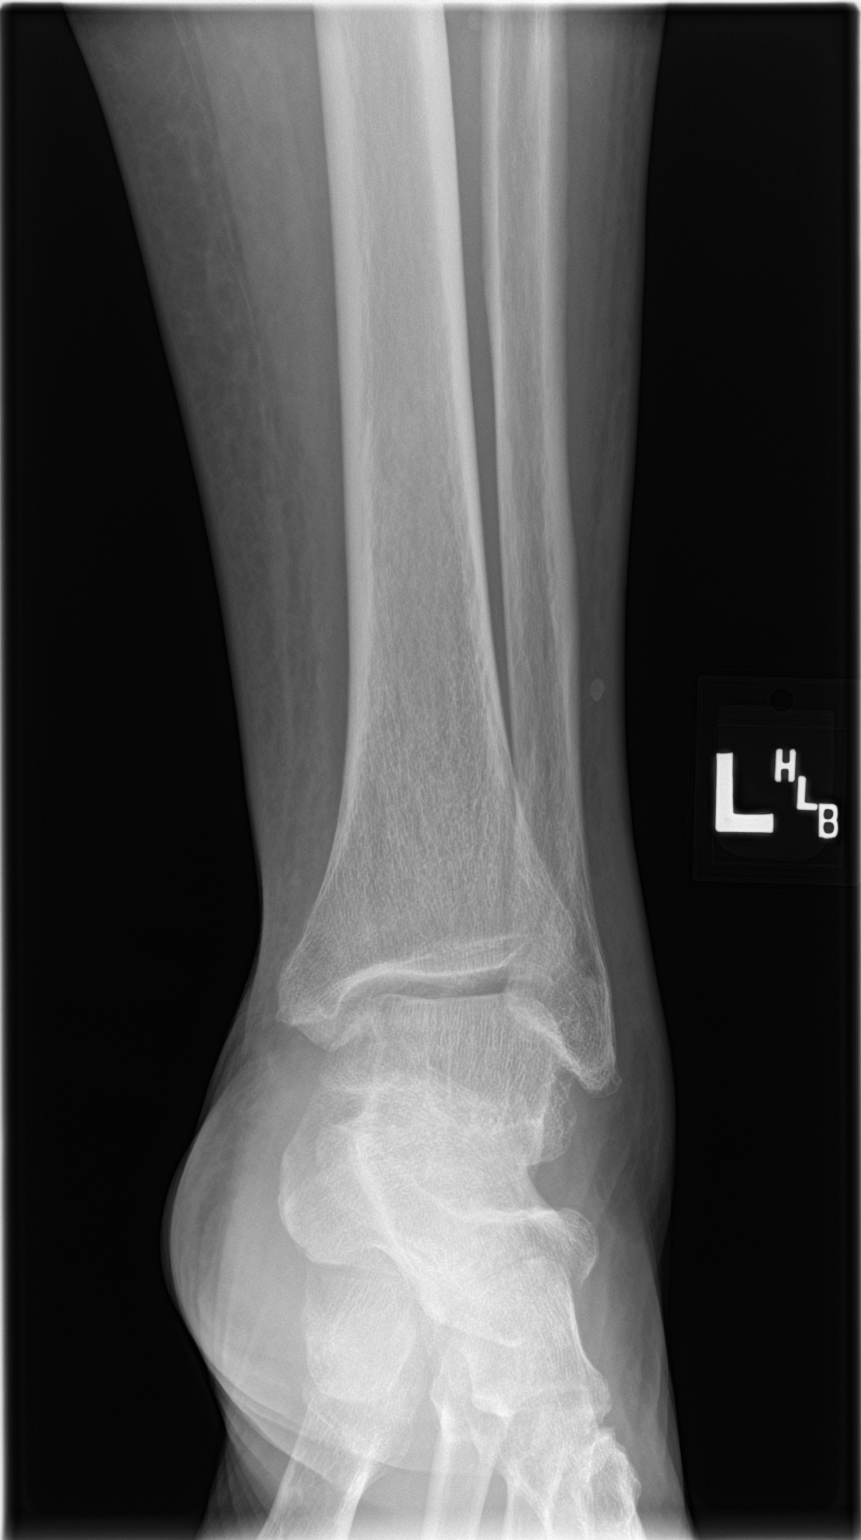

[ankle obl]
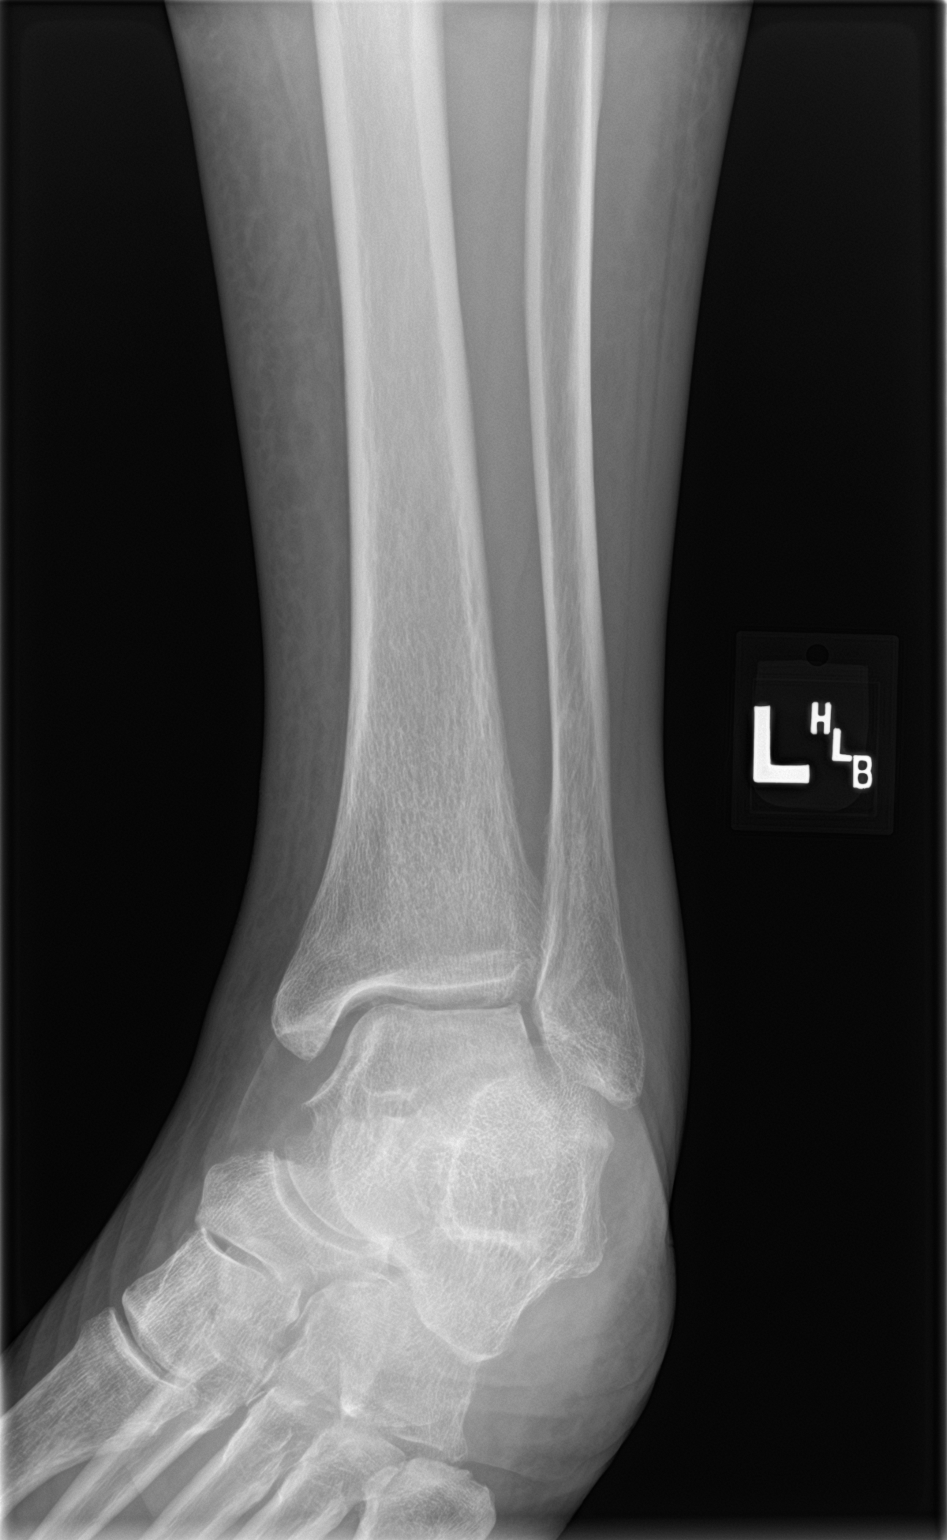

[ankle lat]
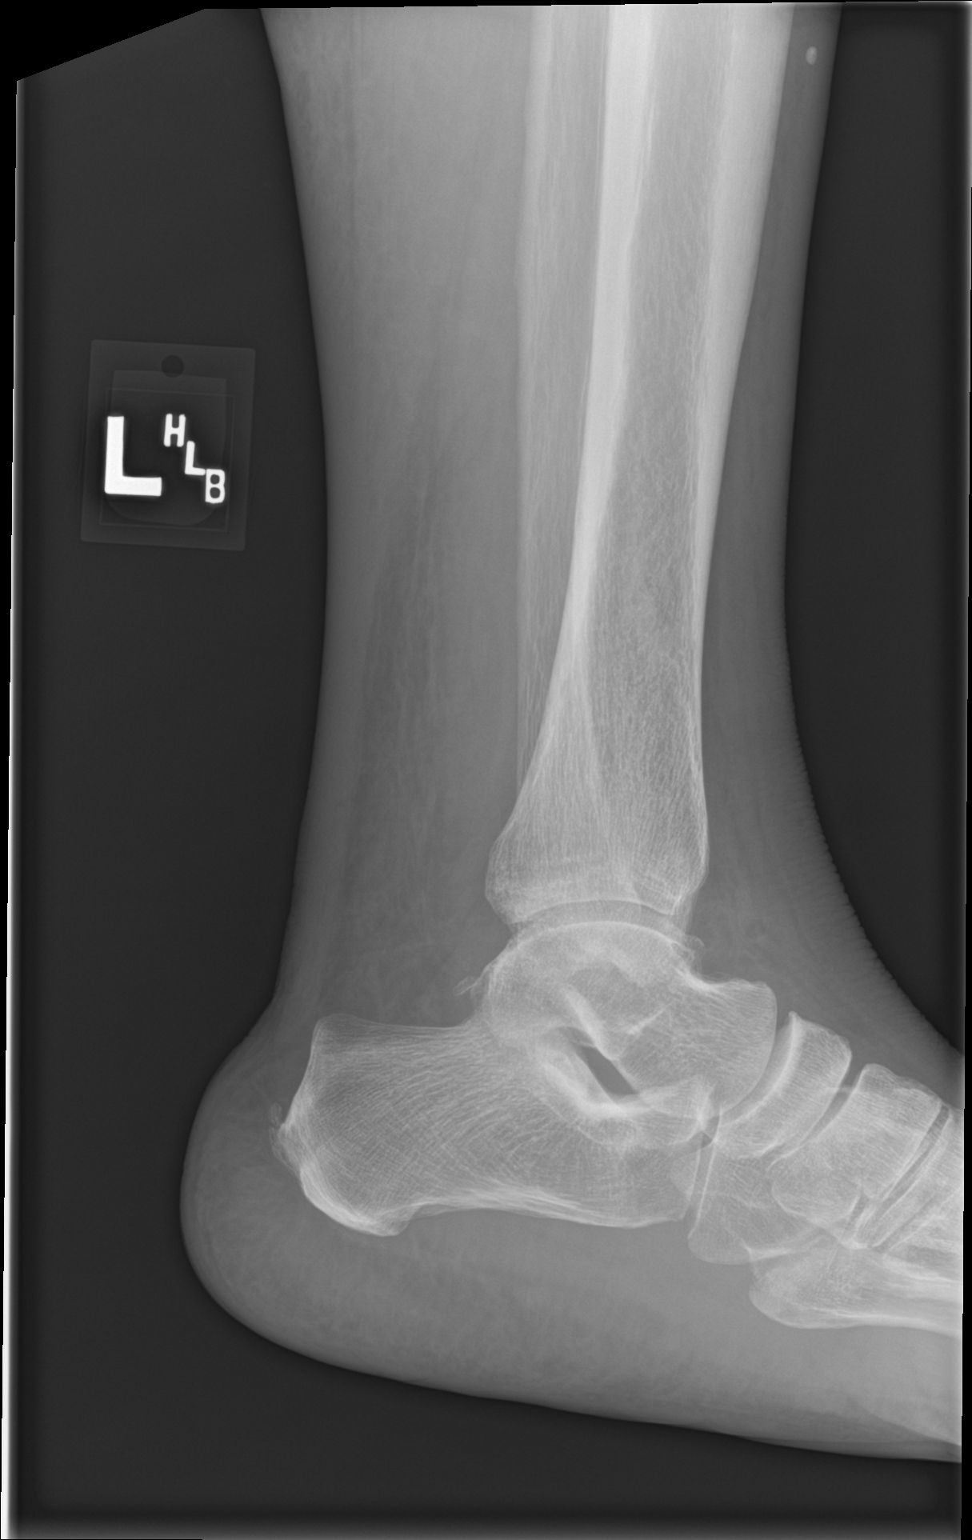

[3 of 3 positions shown; findings below may reference images not displayed]

FINDINGS: Left ankle: Frontal, oblique, and lateral views of the left ankle
demonstrate no fracture, subluxation, or dislocation. Mild
tibiotalar osteoarthritis with joint space narrowing and osteophyte
formation. There is mild diffuse soft tissue edema.

Left foot: Frontal, oblique, and lateral views demonstrate no
fracture, subluxation, or dislocation. Joint spaces are well
preserved. Mild diffuse soft tissue edema.
IMPRESSION: 1. Diffuse soft tissue edema of the left foot and ankle.
2. Left ankle osteoarthritis.
3. No acute bony abnormality.

## 2021-03-12 ENCOUNTER — Emergency Department (HOSPITAL_COMMUNITY): Payer: Medicare (Managed Care)

## 2021-03-12 ENCOUNTER — Other Ambulatory Visit: Payer: Self-pay

## 2021-03-12 ENCOUNTER — Inpatient Hospital Stay (HOSPITAL_COMMUNITY)
Admission: EM | Admit: 2021-03-12 | Discharge: 2021-03-22 | DRG: 069 | Disposition: A | Discharge status: Home Health | Payer: Medicare (Managed Care) | Attending: Internal Medicine | Admitting: Internal Medicine

## 2021-03-12 ENCOUNTER — Encounter (HOSPITAL_COMMUNITY): Payer: Self-pay

## 2021-03-12 DIAGNOSIS — E7800 Pure hypercholesterolemia, unspecified: Secondary | ICD-10-CM | POA: Diagnosis present

## 2021-03-12 DIAGNOSIS — W1830XA Fall on same level, unspecified, initial encounter: Secondary | ICD-10-CM | POA: Diagnosis present

## 2021-03-12 DIAGNOSIS — G9341 Metabolic encephalopathy: Secondary | ICD-10-CM | POA: Diagnosis not present

## 2021-03-12 DIAGNOSIS — E785 Hyperlipidemia, unspecified: Secondary | ICD-10-CM | POA: Diagnosis present

## 2021-03-12 DIAGNOSIS — G894 Chronic pain syndrome: Secondary | ICD-10-CM | POA: Diagnosis present

## 2021-03-12 DIAGNOSIS — E876 Hypokalemia: Secondary | ICD-10-CM | POA: Diagnosis present

## 2021-03-12 DIAGNOSIS — R5381 Other malaise: Secondary | ICD-10-CM | POA: Diagnosis present

## 2021-03-12 DIAGNOSIS — Y92000 Kitchen of unspecified non-institutional (private) residence as  the place of occurrence of the external cause: Secondary | ICD-10-CM

## 2021-03-12 DIAGNOSIS — Z9049 Acquired absence of other specified parts of digestive tract: Secondary | ICD-10-CM

## 2021-03-12 DIAGNOSIS — Z961 Presence of intraocular lens: Secondary | ICD-10-CM | POA: Diagnosis present

## 2021-03-12 DIAGNOSIS — Z9841 Cataract extraction status, right eye: Secondary | ICD-10-CM

## 2021-03-12 DIAGNOSIS — Z20822 Contact with and (suspected) exposure to covid-19: Secondary | ICD-10-CM | POA: Diagnosis present

## 2021-03-12 DIAGNOSIS — I110 Hypertensive heart disease with heart failure: Secondary | ICD-10-CM | POA: Diagnosis present

## 2021-03-12 DIAGNOSIS — F419 Anxiety disorder, unspecified: Secondary | ICD-10-CM | POA: Diagnosis present

## 2021-03-12 DIAGNOSIS — R339 Retention of urine, unspecified: Secondary | ICD-10-CM | POA: Diagnosis present

## 2021-03-12 DIAGNOSIS — Z8673 Personal history of transient ischemic attack (TIA), and cerebral infarction without residual deficits: Secondary | ICD-10-CM

## 2021-03-12 DIAGNOSIS — I10 Essential (primary) hypertension: Secondary | ICD-10-CM | POA: Diagnosis not present

## 2021-03-12 DIAGNOSIS — M549 Dorsalgia, unspecified: Secondary | ICD-10-CM | POA: Diagnosis present

## 2021-03-12 DIAGNOSIS — R443 Hallucinations, unspecified: Secondary | ICD-10-CM | POA: Diagnosis present

## 2021-03-12 DIAGNOSIS — Z886 Allergy status to analgesic agent status: Secondary | ICD-10-CM

## 2021-03-12 DIAGNOSIS — Z9884 Bariatric surgery status: Secondary | ICD-10-CM

## 2021-03-12 DIAGNOSIS — F39 Unspecified mood [affective] disorder: Secondary | ICD-10-CM | POA: Diagnosis present

## 2021-03-12 DIAGNOSIS — G459 Transient cerebral ischemic attack, unspecified: Principal | ICD-10-CM | POA: Diagnosis present

## 2021-03-12 DIAGNOSIS — Z800 Family history of malignant neoplasm of digestive organs: Secondary | ICD-10-CM

## 2021-03-12 DIAGNOSIS — G4733 Obstructive sleep apnea (adult) (pediatric): Secondary | ICD-10-CM | POA: Diagnosis present

## 2021-03-12 DIAGNOSIS — Z981 Arthrodesis status: Secondary | ICD-10-CM

## 2021-03-12 DIAGNOSIS — I959 Hypotension, unspecified: Secondary | ICD-10-CM | POA: Diagnosis not present

## 2021-03-12 DIAGNOSIS — Z833 Family history of diabetes mellitus: Secondary | ICD-10-CM

## 2021-03-12 DIAGNOSIS — R29810 Facial weakness: Secondary | ICD-10-CM | POA: Diagnosis present

## 2021-03-12 DIAGNOSIS — Z7982 Long term (current) use of aspirin: Secondary | ICD-10-CM

## 2021-03-12 DIAGNOSIS — R29711 NIHSS score 11: Secondary | ICD-10-CM | POA: Diagnosis present

## 2021-03-12 DIAGNOSIS — Z8249 Family history of ischemic heart disease and other diseases of the circulatory system: Secondary | ICD-10-CM

## 2021-03-12 DIAGNOSIS — Z801 Family history of malignant neoplasm of trachea, bronchus and lung: Secondary | ICD-10-CM

## 2021-03-12 DIAGNOSIS — R1310 Dysphagia, unspecified: Secondary | ICD-10-CM | POA: Diagnosis present

## 2021-03-12 DIAGNOSIS — H547 Unspecified visual loss: Secondary | ICD-10-CM | POA: Diagnosis present

## 2021-03-12 DIAGNOSIS — Z90710 Acquired absence of both cervix and uterus: Secondary | ICD-10-CM

## 2021-03-12 DIAGNOSIS — R4182 Altered mental status, unspecified: Secondary | ICD-10-CM

## 2021-03-12 DIAGNOSIS — I5032 Chronic diastolic (congestive) heart failure: Secondary | ICD-10-CM | POA: Diagnosis present

## 2021-03-12 DIAGNOSIS — R300 Dysuria: Secondary | ICD-10-CM | POA: Diagnosis present

## 2021-03-12 DIAGNOSIS — I071 Rheumatic tricuspid insufficiency: Secondary | ICD-10-CM | POA: Diagnosis present

## 2021-03-12 DIAGNOSIS — Z9682 Presence of neurostimulator: Secondary | ICD-10-CM

## 2021-03-12 DIAGNOSIS — M797 Fibromyalgia: Secondary | ICD-10-CM | POA: Diagnosis present

## 2021-03-12 DIAGNOSIS — Z79899 Other long term (current) drug therapy: Secondary | ICD-10-CM

## 2021-03-12 DIAGNOSIS — Z91013 Allergy to seafood: Secondary | ICD-10-CM

## 2021-03-12 DIAGNOSIS — Z9842 Cataract extraction status, left eye: Secondary | ICD-10-CM

## 2021-03-12 DIAGNOSIS — Z66 Do not resuscitate: Secondary | ICD-10-CM | POA: Diagnosis present

## 2021-03-12 DIAGNOSIS — K219 Gastro-esophageal reflux disease without esophagitis: Secondary | ICD-10-CM | POA: Diagnosis present

## 2021-03-12 LAB — RESP PANEL BY RT-PCR (FLU A&B, COVID) ARPGX2
Influenza A by PCR: NEGATIVE
Influenza B by PCR: NEGATIVE
SARS Coronavirus 2 by RT PCR: NEGATIVE

## 2021-03-12 LAB — LIPID PANEL
Cholesterol: 104 mg/dL (ref 0–200)
HDL: 68 mg/dL (ref >40)
LDL Cholesterol: 32 mg/dL (ref 0–99)
Total CHOL/HDL Ratio: 1.5 RATIO
Triglycerides: 21 mg/dL (ref <150)
VLDL: 4 mg/dL (ref 0–40)

## 2021-03-12 LAB — COMPREHENSIVE METABOLIC PANEL WITH GFR
ALT: 29 U/L (ref 0–44)
AST: 36 U/L (ref 15–41)
Albumin: 2.5 g/dL — ABNORMAL LOW (ref 3.5–5.0)
Alkaline Phosphatase: 72 U/L (ref 38–126)
Anion gap: 6 (ref 5–15)
BUN: 19 mg/dL (ref 8–23)
CO2: 28 mmol/L (ref 22–32)
Calcium: 8.4 mg/dL — ABNORMAL LOW (ref 8.9–10.3)
Chloride: 105 mmol/L (ref 98–111)
Creatinine, Ser: 1.1 mg/dL — ABNORMAL HIGH (ref 0.44–1.00)
GFR, Estimated: 57 mL/min — ABNORMAL LOW
Glucose, Bld: 84 mg/dL (ref 70–99)
Potassium: 3.2 mmol/L — ABNORMAL LOW (ref 3.5–5.1)
Sodium: 139 mmol/L (ref 135–145)
Total Bilirubin: 0.6 mg/dL (ref 0.3–1.2)
Total Protein: 5.5 g/dL — ABNORMAL LOW (ref 6.5–8.1)

## 2021-03-12 LAB — CBC
HCT: 37.5 % (ref 36.0–46.0)
Hemoglobin: 11.9 g/dL — ABNORMAL LOW (ref 12.0–15.0)
MCH: 30.6 pg (ref 26.0–34.0)
MCHC: 31.7 g/dL (ref 30.0–36.0)
MCV: 96.4 fL (ref 80.0–100.0)
Platelets: 178 K/uL (ref 150–400)
RBC: 3.89 MIL/uL (ref 3.87–5.11)
RDW: 16.5 % — ABNORMAL HIGH (ref 11.5–15.5)
WBC: 10.8 K/uL — ABNORMAL HIGH (ref 4.0–10.5)
nRBC: 0 % (ref 0.0–0.2)

## 2021-03-12 LAB — APTT: aPTT: 31 s (ref 24–36)

## 2021-03-12 LAB — DIFFERENTIAL
Abs Immature Granulocytes: 0.04 K/uL (ref 0.00–0.07)
Basophils Absolute: 0 K/uL (ref 0.0–0.1)
Basophils Relative: 0 %
Eosinophils Absolute: 0.1 K/uL (ref 0.0–0.5)
Eosinophils Relative: 1 %
Immature Granulocytes: 0 %
Lymphocytes Relative: 29 %
Lymphs Abs: 3.1 K/uL (ref 0.7–4.0)
Monocytes Absolute: 1 K/uL (ref 0.1–1.0)
Monocytes Relative: 9 %
Neutro Abs: 6.6 K/uL (ref 1.7–7.7)
Neutrophils Relative %: 61 %

## 2021-03-12 LAB — I-STAT CHEM 8, ED
BUN: 20 mg/dL (ref 8–23)
Calcium, Ion: 1.01 mmol/L — ABNORMAL LOW (ref 1.15–1.40)
Chloride: 106 mmol/L (ref 98–111)
Creatinine, Ser: 1.1 mg/dL — ABNORMAL HIGH (ref 0.44–1.00)
Glucose, Bld: 77 mg/dL (ref 70–99)
HCT: 40 % (ref 36.0–46.0)
Hemoglobin: 13.6 g/dL (ref 12.0–15.0)
Potassium: 3.2 mmol/L — ABNORMAL LOW (ref 3.5–5.1)
Sodium: 141 mmol/L (ref 135–145)
TCO2: 26 mmol/L (ref 22–32)

## 2021-03-12 LAB — CBG MONITORING, ED: Glucose-Capillary: 93 mg/dL (ref 70–99)

## 2021-03-12 LAB — PROTIME-INR
INR: 1.1 (ref 0.8–1.2)
Prothrombin Time: 14 s (ref 11.4–15.2)

## 2021-03-12 LAB — VITAMIN B12: Vitamin B-12: 341 pg/mL (ref 180–914)

## 2021-03-12 LAB — MAGNESIUM: Magnesium: 1.6 mg/dL — ABNORMAL LOW (ref 1.7–2.4)

## 2021-03-12 LAB — VITAMIN D 25 HYDROXY (VIT D DEFICIENCY, FRACTURES): Vit D, 25-Hydroxy: 66.98 ng/mL (ref 30–100)

## 2021-03-12 LAB — PHOSPHORUS: Phosphorus: 3.5 mg/dL (ref 2.5–4.6)

## 2021-03-12 LAB — ETHANOL: Alcohol, Ethyl (B): <10 mg/dL

## 2021-03-12 LAB — FOLATE: Folate: 18.9 ng/mL (ref >5.9)

## 2021-03-12 MED ORDER — STROKE: EARLY STAGES OF RECOVERY BOOK: Freq: Once | Status: DC

## 2021-03-12 MED ORDER — ACETAMINOPHEN 160 MG/5ML PO SOLN
650.0000 mg | ORAL | Status: DC | PRN
Start: 2021-03-12 — End: 2021-03-22

## 2021-03-12 MED ORDER — IOHEXOL 350 MG/ML SOLN
80.0000 mL | Freq: Once | INTRAVENOUS | Status: AC | PRN
  Administered 2021-03-12: 80 mL via INTRAVENOUS

## 2021-03-12 MED ORDER — LIDOCAINE 5 % EX PTCH
1.0000 | MEDICATED_PATCH | CUTANEOUS | Status: DC
  Administered 2021-03-12 – 2021-03-21 (×10): 1 via TRANSDERMAL
  Filled 2021-03-12 (×10): qty 1

## 2021-03-12 MED ORDER — SENNOSIDES-DOCUSATE SODIUM 8.6-50 MG PO TABS
1.0000 | ORAL_TABLET | Freq: Every evening | ORAL | Status: DC | PRN
  Administered 2021-03-13: 1 via ORAL
  Filled 2021-03-12: qty 1

## 2021-03-12 MED ORDER — SODIUM CHLORIDE 0.9 % IV SOLN: INTRAVENOUS | Status: DC

## 2021-03-12 MED ORDER — ACETAMINOPHEN 325 MG PO TABS
650.0000 mg | ORAL_TABLET | ORAL | Status: DC | PRN
  Administered 2021-03-12 – 2021-03-17 (×6): 650 mg via ORAL
  Filled 2021-03-12 (×6): qty 2

## 2021-03-12 MED ORDER — ACETAMINOPHEN 650 MG RE SUPP: 650.0000 mg | RECTAL | Status: DC | PRN

## 2021-03-12 MED ORDER — ENOXAPARIN SODIUM 40 MG/0.4ML IJ SOSY
40.0000 mg | PREFILLED_SYRINGE | INTRAMUSCULAR | Status: DC
  Administered 2021-03-12 – 2021-03-21 (×10): 40 mg via SUBCUTANEOUS
  Filled 2021-03-12 (×10): qty 0.4

## 2021-03-12 NOTE — ED Provider Notes (Signed)
[facility] EMERGENCY DEPARTMENT Provider Note   CSN: [id number] Arrival date & time: 03/12/21  1249  An emergency department physician performed an initial assessment on this suspected stroke patient at 1329.  History Chief Complaint  Patient presents with   Code Stroke    Jodi Bowman is a 60 y.o. female presenting for evaluation of weakness and sleepiness.   Level V caveat due to AMS.   Per son, pt was at home with pts sister. When son returned home at 1145, pt was noted to be sleepy with possible speech slurring. At 12, pt fell and afterwards son noticed R sided facial droop and pt leaning towards the R. Pts sister did not report anything different from baseline.  Patient reports chronic pain, but no new pain.  She does report dysuria and decreased urination, but no hematuria.  No fevers or chills.  No cough, chest pain, nausea, vomiting.  Additional history obtained from chart review.  Per chart review, patient has had a previous stroke.  She has been seen twice in the past 6 months for similar presentation of sleepiness/altered mental status/weakness, once thought to be to stroke and the other time thought to be due to an opioid overdose (accidental).  History of arthritis, fibromyalgia, GERD, hypertension, chronic pain, obesity, sleep apnea, CVA.   HPI     Past Medical History:  Diagnosis Date   Arthritis    Calculus of gallbladder without mention of cholecystitis or obstruction    Dizziness    Fibromyalgia    GERD (gastroesophageal reflux disease)    Goiter    Hypercholesteremia    Hypertension    Lower back pain    Migraine    Nontoxic uninodular goiter    sees dr vollmer at [facility]   Obesity    PONV (postoperative nausea and vomiting)    Sleep apnea    STOPBANG=5   Stroke ([place]) 2000    Patient Active Problem List   Diagnosis Date Noted   Weakness 02/54/2706   Acute metabolic encephalopathy 23/76/2831   Mental status change  resolved 08/27/2020   Weakness of left side of body 06/29/2020   ACS (acute coronary syndrome) ([place]) 08/25/2019   Chest pain 08/25/2019   Postoperative seroma involving nervous system after nervous system procedure 04/05/2019   Drainage from wound 04/02/2019   Infective otitis externa of left ear    Left otitis media    Urinary retention    Orthostasis    Acute blood loss anemia    Fibromyalgia    Chronic pain syndrome    Lumbar radiculopathy 03/08/2019   Orthostatic hypotension 03/06/2019   Postoperative urinary retention 03/06/2019   Lumbar foraminal stenosis 03/04/2019   S/P exploratory laparotomy 03/06/2018   Bowel obstruction ([place]) 03/06/2018   Chest pain, rule out acute myocardial infarction 12/17/2017   Hypokalemia 12/17/2017   Acute lower UTI 12/17/2017   Vertigo 04/30/2017   Small vessel disease, cerebrovascular 04/30/2017   Chronic low back pain 08/01/2015   Right hip pain 08/01/2015   Abnormality of gait 08/01/2015   Left leg weakness    Low back pain with radiation    Syncope 07/30/2014   Atypical chest pain 03/12/2014   Lap chole IOC April 2013 01/29/2012   Gallstones 12/04/2011   Thyroid nodule-non neoplastic goiter by needle aspiration 12/04/2011   GLUCOSE INTOLERANCE 03/12/2010   DYSLIPIDEMIA 03/12/2010   Chronic migraine 03/12/2010   Carotid stenosis 03/12/2010   CEREBROVASCULAR ACCIDENT 03/12/2010   LIPOMA 01/29/2010  HEADACHE 01/29/2010   ANKLE INJURY, RIGHT 04/12/2009   PHARYNGITIS 03/21/2009   Backache 03/01/2009   ALLERGIC RHINITIS 03/17/2007   LOW BACK PAIN 03/17/2007   Essential hypertension 01/01/2007   ANXIETY STATE NOS 09/11/2005    Past Surgical History:  Procedure Laterality Date   ABDOMINAL HYSTERECTOMY     BOWEL RESECTION N/A 03/06/2018   Procedure: SMALL BOWEL ANASTAMOSIS;  Surgeon: Clovis [facility], MD;  Location: [facility];  Service: General;  Laterality: N/A;   CATARACT EXTRACTION W/ INTRAOCULAR LENS IMPLANT Bilateral    CESAREAN  SECTION  yrs ago   done x 2   CHOLECYSTECTOMY  01/05/2012   Procedure: LAPAROSCOPIC CHOLECYSTECTOMY WITH INTRAOPERATIVE CHOLANGIOGRAM;  Surgeon: Pedro [facility], MD;  Location: [facility];  Service: General;  Laterality: N/A;   COLONOSCOPY  10/08/2012   Procedure: COLONOSCOPY;  Surgeon: Beryle Beams, MD;  Location: [facility];  Service: Endoscopy;  Laterality: N/A;   KNEE ARTHROSCOPY  one 1995 and 1 in 1997   both knees done   LAPAROSCOPY N/A 03/06/2018   Procedure: LAPAROSCOPY DIAGNOSTIC WITH LYSIS OF ADHESIONS;  Surgeon: Clovis [facility], MD;  Location: [facility];  Service: General;  Laterality: N/A;   LAPAROTOMY N/A 03/06/2018   Procedure: EXPLORATORY LAPAROTOMY, RESECTION OF DISTAL ROUX, CLOSURE OF INTERNAL HERNIA;  Surgeon: Clovis [facility], MD;  Location: [facility];  Service: General;  Laterality: N/A;   LEFT HEART CATH AND CORONARY ANGIOGRAPHY N/A 08/29/2019   Procedure: LEFT HEART CATH AND CORONARY ANGIOGRAPHY;  Surgeon: Charolette [facility], MD;  Location: [facility];  Service: Cardiovascular;  Laterality: N/A;   LUMBAR WOUND DEBRIDEMENT N/A 04/03/2019   Procedure: LUMBAR WOUND WASHOUT;  Surgeon: Judith Part, MD;  Location: [facility];  Service: Neurosurgery;  Laterality: N/A;   POSTERIOR LUMBAR FUSION  03/04/2019   surgery for endometriosis  yrs ago   thryoid biopsy  December 01, 2011    at [facility]     OB History   No obstetric history on file.     Family History  Problem Relation Age of Onset   Diabetes Mother    Hypertension Mother    Transient ischemic attack Mother    Seizures Mother    Dementia Mother    Other Father        MVA   Cancer Brother        colon and lung   Cancer Maternal Grandmother        colon    Social History   Tobacco Use   Smoking status: Never   Smokeless tobacco: Never  Vaping Use   Vaping Use: Never used  Substance Use Topics   Alcohol use: Not Currently    Alcohol/week: 0.0 standard drinks   Drug use: No    Home Medications Prior to  Admission medications   Medication Sig Start Date End Date Taking? Authorizing Provider  albuterol (PROVENTIL HFA) 108 (90 Base) MCG/ACT inhaler Inhale 1-2 puffs into the lungs every 6 (six) hours as needed for wheezing or shortness of breath.    [provider]  amLODipine (NORVASC) 5 MG tablet Take 5 mg by mouth daily. 01/14/20   [provider]  aspirin EC 81 MG tablet Take 81 mg by mouth daily.    [provider]  D3-50 1.25 MG (50000 UT) capsule Take 50,000 Units by mouth 3 (three) times a week. 05/06/20   [provider]  diclofenac Sodium (VOLTAREN) 1 % GEL Apply 4 g topically 2 (two) times  daily as needed (back pain).    [provider]  DULoxetine (CYMBALTA) 60 MG capsule Take 60 mg by mouth daily. 08/11/19   [provider]  escitalopram (LEXAPRO) 10 MG tablet Take 10 mg by mouth at bedtime. 05/04/20   [provider]  ferrous sulfate 325 (65 FE) MG EC tablet Take 325 mg by mouth daily. 07/26/19   [provider]  fluticasone (FLONASE) 50 MCG/ACT nasal spray Place 1 spray into both nostrils daily as needed for allergies.  11/08/17   [provider]  folic acid (FOLVITE) 1 MG tablet Take 1 mg by mouth daily. 10/14/17   [provider]  HYDROcodone-acetaminophen (NORCO) 10-325 MG tablet Take 1 tablet by mouth 5 (five) times daily as needed for moderate pain or severe pain.  02/16/20   [provider]  isosorbide mononitrate (IMDUR) 30 MG 24 hr tablet Take 1 tablet (30 mg total) by mouth daily. 11/18/19   Corena Herter, PA-C  loratadine (CLARITIN) 10 MG tablet Take 10 mg by mouth daily.     [provider]  losartan-hydrochlorothiazide (HYZAAR) 100-12.5 MG tablet Take 1 tablet by mouth daily. 01/13/20   [provider]  Magnesium Oxide 400 MG CAPS Take 1 capsule (400 mg total) by mouth daily. Patient not taking: Reported on 08/27/2020 09/28/19   Carlisle Cater, PA-C  metoprolol  succinate (TOPROL-XL) 100 MG 24 hr tablet Take 100 mg by mouth at bedtime.  07/10/19   [provider]  mirtazapine (REMERON) 7.5 MG tablet Take 7.5 mg by mouth at bedtime. 07/30/20   [provider]  morphine (MS CONTIN) 30 MG 12 hr tablet Take 30 mg by mouth every 12 (twelve) hours. 06/14/20   [provider]  nitroGLYCERIN (NITROSTAT) 0.4 MG SL tablet Place 1 tablet (0.4 mg total) under the tongue every 5 (five) minutes x 3 doses as needed for chest pain. Patient taking differently: Place 0.4 mg under the tongue every 5 (five) minutes as needed for chest pain (MAXIMUM OF 3 DOSES).  03/13/14   Charolette Forward, MD  ondansetron (ZOFRAN-ODT) 4 MG disintegrating tablet Take 4 mg by mouth every 8 (eight) hours as needed for nausea/vomiting. 06/07/20   [provider]  pantoprazole (PROTONIX) 40 MG tablet Take 1 tablet (40 mg total) by mouth 2 (two) times daily. 03/15/19   Angiulli, Lavon Paganini, PA-C  polyethylene glycol (MIRALAX / GLYCOLAX) 17 g packet Take 17 g by mouth daily as needed for mild constipation. Patient not taking: Reported on 08/27/2020 03/08/19   Viona Gilmore D, NP  potassium chloride SA (KLOR-CON) 20 MEQ tablet Take 1 tablet (20 mEq total) by mouth 2 (two) times daily. 09/28/19   Carlisle Cater, PA-C  pregabalin (LYRICA) 75 MG capsule Take 1 capsule (75 mg total) by mouth 3 (three) times daily. 03/15/19   Angiulli, Lavon Paganini, PA-C  metoprolol (LOPRESSOR) 50 MG tablet Take 50 mg by mouth 2 (two) times daily.    12/04/11  [provider]  simvastatin (ZOCOR) 20 MG tablet Take 20 mg by mouth at bedtime.    12/04/11  [provider]    Allergies    Shrimp [shellfish allergy] and Naproxen  Review of Systems   Review of Systems  Unable to perform ROS: Mental status change  Constitutional:        Pt is very sleepy  Genitourinary:  Positive for decreased urine volume and dysuria.  Neurological:  Positive for weakness.   Physical Exam Updated  Vital  Signs BP (!) 158/91   Pulse 66   Temp 98.3 F (36.8 C)   Resp 12   Ht 4\' 11"  (1.499 m)   Wt 64 kg   SpO2 100%   BMI 28.50 kg/m   Physical Exam Vitals and nursing note reviewed.  Constitutional:      General: She is not in acute distress.    Appearance: Normal appearance.     Comments: Pt is very sleepy on exam, difficult to keep awake during history.   HENT:     Head: Normocephalic and atraumatic.  Eyes:     Extraocular Movements: Extraocular movements intact.     Conjunctiva/sclera: Conjunctivae normal.     Pupils: Pupils are equal, round, and reactive to light.  Cardiovascular:     Rate and Rhythm: Normal rate and regular rhythm.     Pulses: Normal pulses.  Pulmonary:     Effort: Pulmonary effort is normal. No respiratory distress.     Breath sounds: Normal breath sounds. No wheezing.     Comments: Speaking in full sentences.  Clear lung sounds in all fields. Abdominal:     General: There is no distension.     Palpations: Abdomen is soft. There is no mass.     Tenderness: There is no abdominal tenderness. There is no guarding or rebound.     Comments: No ttp   Musculoskeletal:        General: Normal range of motion.     Cervical back: Normal range of motion and neck supple.  Skin:    General: Skin is warm and dry.     Capillary Refill: Capillary refill takes less than 2 seconds.  Neurological:     Mental Status: She is lethargic.     GCS: GCS eye subscore is 3. GCS verbal subscore is 5. GCS motor subscore is 6.     Cranial Nerves: Dysarthria and facial asymmetry present.     Sensory: Sensory deficit present.     Motor: Weakness present.  Psychiatric:        Mood and Affect: Mood and affect normal.        Speech: Speech normal.        Behavior: Behavior normal.    ED Results / Procedures / Treatments   Labs (all labs ordered are listed, but only abnormal results are displayed) Labs Reviewed  CBC - Abnormal; Notable for the following components:       Result Value   WBC 10.8 (*)    Hemoglobin 11.9 (*)    RDW 16.5 (*)    All other components within normal limits  COMPREHENSIVE METABOLIC PANEL - Abnormal; Notable for the following components:   Potassium 3.2 (*)    Creatinine, Ser 1.10 (*)    Calcium 8.4 (*)    Total Protein 5.5 (*)    Albumin 2.5 (*)    GFR, Estimated 57 (*)    All other components within normal limits  I-STAT CHEM 8, ED - Abnormal; Notable for the following components:   Potassium 3.2 (*)    Creatinine, Ser 1.10 (*)    Calcium, Ion 1.01 (*)    All other components within normal limits  RESP PANEL BY RT-PCR (FLU A&B, COVID) ARPGX2  ETHANOL  PROTIME-INR  APTT  DIFFERENTIAL  RAPID URINE DRUG SCREEN, HOSP PERFORMED  URINALYSIS, ROUTINE W REFLEX MICROSCOPIC  VITAMIN D 25 HYDROXY (VIT D DEFICIENCY, FRACTURES)  VITAMIN B12  VITAMIN B1  MAGNESIUM  PHOSPHORUS  FOLATE  HEMOGLOBIN A1C  LIPID PANEL  CBG MONITORING, ED    EKG None  Radiology CT HEAD CODE STROKE WO CONTRAST  Result Date: 03/12/2021 CLINICAL DATA:  Code stroke.  Neuro deficit, acute stroke suspected. EXAM: CT HEAD WITHOUT CONTRAST TECHNIQUE: Contiguous axial images were obtained from the base of the skull through the vertex without intravenous contrast. COMPARISON:  CT head August 27, 2020 FINDINGS: Brain: No evidence of acute large vascular territory infarction, hemorrhage, hydrocephalus, extra-axial collection or mass lesion/mass effect. Mild patchy white matter hypoattenuation, most likely related chronic microvascular disease. Vascular: No hyperdense vessel identified. Skull: No acute fracture. Sinuses/Orbits: Small amount of fluid layering in the left maxillary sinus. Otherwise, clear visualized sinuses. Other: No mastoid effusions. ASPECTS [facility] Stroke Program Early CT Score) Total score (0-10 with 10 being normal): 10. IMPRESSION: 1. No evidence of acute intracranial abnormality. ASPECTS is 10. 2. Mild chronic microvascular ischemic disease.  Code stroke imaging results were communicated on 03/12/2021 at 1:53 pm to provider Dr. Cheral Marker via secure text paging. Electronically Signed   By: Margaretha Sheffield MD   On: 03/12/2021 13:55   CT ANGIO HEAD NECK W WO CM W PERF (CODE STROKE)  Result Date: 03/12/2021 CLINICAL DATA:  Right facial droop EXAM: CT ANGIOGRAPHY HEAD AND NECK CT PERFUSION BRAIN TECHNIQUE: Multidetector CT imaging of the head and neck was performed using the standard protocol during bolus administration of intravenous contrast. Multiplanar CT image reconstructions and MIPs were obtained to evaluate the vascular anatomy. Carotid stenosis measurements (when applicable) are obtained utilizing NASCET criteria, using the distal internal carotid diameter as the denominator. Multiphase CT imaging of the brain was performed following IV bolus contrast injection. Subsequent parametric perfusion maps were calculated using RAPID software. CONTRAST:  41mL OMNIPAQUE IOHEXOL 350 MG/ML SOLN COMPARISON:  None. FINDINGS: CTA NECK Aortic arch: Great vessel origins are patent. There is direct origin of the left artery from the arch. Right carotid system: Patent. Mild mixed plaque along the proximal internal carotid without stenosis. Left carotid system: Patent. Minimal mixed plaque along the proximal internal carotid without stenosis. Vertebral arteries: Patent. Right vertebral artery slightly dominant. No stenosis. Skeleton: Minor degenerative changes of the included spine. Other neck: Multinodular thyroid, with previous biopsy on the right in 2015. Upper chest: Included upper lungs are clear. Review of the MIP images confirms the above findings CTA HEAD Anterior circulation: Intracranial internal carotid arteries are patent with minimal calcified plaque. Anterior and middle cerebral arteries are patent. Posterior circulation: Intracranial vertebral arteries are patent. Basilar artery is patent. Major cerebellar artery origins are patent. Posterior  communicating artery is identified on the left. Possible diminutive posterior communicating artery on the right. Posterior cerebral arteries are patent. Venous sinuses: Patent as allowed by contrast bolus timing. Review of the MIP images confirms the above findings CT Brain Perfusion Findings: CBF (<30%) Volume: 23mL Perfusion (Tmax>6.0s) volume: 39mL Mismatch Volume: 26mL Infarction Location: None IMPRESSION: No large vessel occlusion, hemodynamically significant stenosis, or evidence of dissection. Perfusion imaging demonstrates no evidence of core infarction or penumbra. These results were communicated to Dr. Cheral Marker at 2:11 pm on 03/12/2021 by text page via the [facility] messaging system. Electronically Signed   By: Macy Mis M.D.   On: 03/12/2021 14:21    Procedures Procedures   Medications Ordered in ED Medications  iohexol (OMNIPAQUE) 350 MG/ML injection 80 mL (80 mLs Intravenous Contrast Given 03/12/21 1408)    ED Course  I have reviewed the triage vital signs and the nursing notes.  Pertinent labs &  imaging results that were available during my care of the patient were reviewed by me and considered in my medical decision making (see chart for details).    MDM Rules/Calculators/A&P                          Patient presented for evaluation of weakness, somnolence, and right-sided droop.  Code stroke was activated in triage.  On exam, patient is very sleepy, making exam difficult.  Neuro team evaluated the patient and CT, and CTA with perfusion was ordered.  Labs obtained from triage interpreted by me, overall reassuring.  Urine is still pending.  CT head and CTA with perfusion negative for acute findings.  Neurology recommends MRI.  Patient will need to be admitted for confusion/lethargy/weakness.  Discussed with Dr Lorin Mercy from [facility]ad hospitalist service, pt to be admitted.   Final Clinical Impression(s) / ED Diagnoses Final diagnoses:  Altered mental status, unspecified altered mental  status type    Rx / DC Orders ED Discharge Orders     None        Franchot Heidelberg, PA-C 03/12/21 1540    Sherwood Gambler, MD 03/13/21 1259

## 2021-03-12 NOTE — Consult Note (Signed)
Neurology Consultation  Reason for Consult: CODE STROKE for right facial droop, lethargy, and dysarthria  Referring Physician: Dr. Regenia Skeeter  CC: Right facial droop, lethargy  History is obtained from: Patient, [name] at bedside, Chart review  HPI: Jodi Bowman is a 60 y.o. female with a medical history significant for hypertension, migraine headaches, CVA in 2000, gastric bypass in 2017, and hypercholesteremia who presented to the ED via private vehicle for evaluation of lethargy, dysarthria, and right facial droop s/p a fall this morning. Jodi Bowman husband states that he spoke with Jodi Bowman when he arrived home from work around 11:45 this morning while she was cooking with her sister and noted that she seemed lethargic with slightly slurred speech. He states that he went into his bedroom when he heard a "thump" around 12:00 PM and found that Jodi Bowman had fallen without hitting her head or losing consciousness. When he went to stand her up he noted that she was weak and had drooping of the right side of her face. He states that she is supposed to eat frequently but her sister states she had not eaten yet today so he gave her a shake which was subsequently spilled from her mouth down the right side of her shirt. In triage, she was noted to be leaning towards her right with right facial droop and lethargy and a CODE STROKE was activated for further neurology evaluation.   Patient's last known well is unclear. Patient states that she was at baseline at 21:30 last night before bed and woke up feeling "off". [name] states he saw her this morning at 11:45 and she was acting normally just appeared tired with some slurred speech but saw her last at true baseline last night at 19:00. He also stated that he did not notice true weakness until today at 12:00. Patient's sister states that at 19:00 yesterday, Jodi Bowman was lethargic and sleeping more than normally. Jodi Bowman  does endorse recent headaches as severe as 10/10 but states that her current headache severity is 3/10. She also endorses bilateral peripheral vision loss over the past 3 - 4 weeks. Patient states that this morning she took her Morphine like she does every morning and she also took diphenhydramine though patient's sister believes that she took 2 diphenhydramine pills this morning. She also briefly mentioned taking hydrocodone this morning but later denied taking this medication.   LKW: Unclear tpa given?: no, unclear last known well time IR Thrombectomy? No, presentation not consistent with LVO, imaging without evidence of large vessel occlusion Modified Rankin Scale: 2-Slight disability-UNABLE to perform all activities but does not need assistance  ROS: A complete ROS was performed and is negative except as noted in the HPI.   Past Medical History:  Diagnosis Date   Arthritis    Calculus of gallbladder without mention of cholecystitis or obstruction    Dizziness    Fibromyalgia    GERD (gastroesophageal reflux disease)    Goiter    Hypercholesteremia    Hypertension    Lower back pain    Migraine    Nontoxic uninodular goiter    sees dr vollmer at [facility]   Obesity    PONV (postoperative nausea and vomiting)    Sleep apnea    STOPBANG=5   Stroke ([place]) 2000   Past Surgical History:  Procedure Laterality Date   ABDOMINAL HYSTERECTOMY     BOWEL RESECTION N/A 03/06/2018   Procedure: SMALL BOWEL ANASTAMOSIS;  Surgeon: Clovis Riley,  MD;  Location: [facility];  Service: General;  Laterality: N/A;   CATARACT EXTRACTION W/ INTRAOCULAR LENS IMPLANT Bilateral    CESAREAN SECTION  yrs ago   done x 2   CHOLECYSTECTOMY  01/05/2012   Procedure: LAPAROSCOPIC CHOLECYSTECTOMY WITH INTRAOPERATIVE CHOLANGIOGRAM;  Surgeon: Pedro Earls, MD;  Location: [facility];  Service: General;  Laterality: N/A;   COLONOSCOPY  10/08/2012   Procedure: COLONOSCOPY;  Surgeon: Beryle Beams, MD;  Location: [facility];  Service: Endoscopy;  Laterality: N/A;   KNEE ARTHROSCOPY  one 1995 and 1 in 1997   both knees done   LAPAROSCOPY N/A 03/06/2018   Procedure: LAPAROSCOPY DIAGNOSTIC WITH LYSIS OF ADHESIONS;  Surgeon: Clovis Riley, MD;  Location: [facility];  Service: General;  Laterality: N/A;   LAPAROTOMY N/A 03/06/2018   Procedure: EXPLORATORY LAPAROTOMY, RESECTION OF DISTAL ROUX, CLOSURE OF INTERNAL HERNIA;  Surgeon: Clovis Riley, MD;  Location: [facility];  Service: General;  Laterality: N/A;   LEFT HEART CATH AND CORONARY ANGIOGRAPHY N/A 08/29/2019   Procedure: LEFT HEART CATH AND CORONARY ANGIOGRAPHY;  Surgeon: Charolette Forward, MD;  Location: [facility];  Service: Cardiovascular;  Laterality: N/A;   LUMBAR WOUND DEBRIDEMENT N/A 04/03/2019   Procedure: LUMBAR WOUND WASHOUT;  Surgeon: Judith Part, MD;  Location: [facility];  Service: Neurosurgery;  Laterality: N/A;   POSTERIOR LUMBAR FUSION  03/04/2019   surgery for endometriosis  yrs ago   thryoid biopsy  December 01, 2011    at [facility]   Family History  Problem Relation Age of Onset   Diabetes Mother    Hypertension Mother    Transient ischemic attack Mother    Seizures Mother    Dementia Mother    Other Father        MVA   Cancer Brother        colon and lung   Cancer Maternal Grandmother        colon   Social History:   reports that she has never smoked. She has never used smokeless tobacco. She reports previous alcohol use. She reports that she does not use drugs.  Medications No current facility-administered medications for this encounter.  Current Outpatient Medications:    albuterol (PROVENTIL HFA) 108 (90 Base) MCG/ACT inhaler, Inhale 1-2 puffs into the lungs every 6 (six) hours as needed for wheezing or shortness of breath., Disp: , Rfl:    amLODipine (NORVASC) 5 MG tablet, Take 5 mg by mouth daily., Disp: , Rfl:    aspirin EC 81 MG tablet, Take 81 mg by mouth daily., Disp: , Rfl:    D3-50 1.25 MG (50000 UT) capsule, Take  50,000 Units by mouth 3 (three) times a week., Disp: , Rfl:    diclofenac Sodium (VOLTAREN) 1 % GEL, Apply 4 g topically 2 (two) times daily as needed (back pain)., Disp: , Rfl:    DULoxetine (CYMBALTA) 60 MG capsule, Take 60 mg by mouth daily., Disp: , Rfl:    escitalopram (LEXAPRO) 10 MG tablet, Take 10 mg by mouth at bedtime., Disp: , Rfl:    ferrous sulfate 325 (65 FE) MG EC tablet, Take 325 mg by mouth daily., Disp: , Rfl:    fluticasone (FLONASE) 50 MCG/ACT nasal spray, Place 1 spray into both nostrils daily as needed for allergies. , Disp: , Rfl: 0   folic acid (FOLVITE) 1 MG tablet, Take 1 mg by mouth daily., Disp: , Rfl: 11   HYDROcodone-acetaminophen (NORCO) 10-325 MG tablet, Take  1 tablet by mouth 5 (five) times daily as needed for moderate pain or severe pain. , Disp: , Rfl:    isosorbide mononitrate (IMDUR) 30 MG 24 hr tablet, Take 1 tablet (30 mg total) by mouth daily., Disp: 30 tablet, Rfl: 0   loratadine (CLARITIN) 10 MG tablet, Take 10 mg by mouth daily. , Disp: , Rfl:    losartan-hydrochlorothiazide (HYZAAR) 100-12.5 MG tablet, Take 1 tablet by mouth daily., Disp: , Rfl:    Magnesium Oxide 400 MG CAPS, Take 1 capsule (400 mg total) by mouth daily. (Patient not taking: Reported on 08/27/2020), Disp: 15 capsule, Rfl: 0   metoprolol succinate (TOPROL-XL) 100 MG 24 hr tablet, Take 100 mg by mouth at bedtime. , Disp: , Rfl:    mirtazapine (REMERON) 7.5 MG tablet, Take 7.5 mg by mouth at bedtime., Disp: , Rfl:    morphine (MS CONTIN) 30 MG 12 hr tablet, Take 30 mg by mouth every 12 (twelve) hours., Disp: , Rfl:    nitroGLYCERIN (NITROSTAT) 0.4 MG SL tablet, Place 1 tablet (0.4 mg total) under the tongue every 5 (five) minutes x 3 doses as needed for chest pain. (Patient taking differently: Place 0.4 mg under the tongue every 5 (five) minutes as needed for chest pain (MAXIMUM OF 3 DOSES). ), Disp: 25 tablet, Rfl: 12   ondansetron (ZOFRAN-ODT) 4 MG disintegrating tablet, Take 4 mg by mouth  every 8 (eight) hours as needed for nausea/vomiting., Disp: , Rfl:    pantoprazole (PROTONIX) 40 MG tablet, Take 1 tablet (40 mg total) by mouth 2 (two) times daily., Disp: 60 tablet, Rfl: 0   polyethylene glycol (MIRALAX / GLYCOLAX) 17 g packet, Take 17 g by mouth daily as needed for mild constipation. (Patient not taking: Reported on 08/27/2020), Disp: 14 each, Rfl: 0   potassium chloride SA (KLOR-CON) 20 MEQ tablet, Take 1 tablet (20 mEq total) by mouth 2 (two) times daily., Disp: 30 tablet, Rfl: 0   pregabalin (LYRICA) 75 MG capsule, Take 1 capsule (75 mg total) by mouth 3 (three) times daily., Disp: 90 capsule, Rfl: 0  Exam: Current vital signs: BP (!) 163/98   Pulse 78   Temp 98.3 F (36.8 C)   Resp 19   Ht 4\' 11"  (1.499 m)   Wt 64 kg   SpO2 100%   BMI 28.50 kg/m  Vital signs in last 24 hours: Temp:  [98.3 F (36.8 C)] 98.3 F (36.8 C) (06/14 1253) Pulse Rate:  [65-78] 78 (06/14 1335) Resp:  [16-19] 19 (06/14 1415) BP: (158-163)/(93-98) 163/98 (06/14 1415) SpO2:  [99 %-100 %] 100 % (06/14 1335) Weight:  [64 kg] 64 kg (06/14 1411)  GENERAL: Drowsy, laying on CT scanner without acute distress Head: Normocephalic and atraumatic EENT: Normal conjunctivae, no OP obstruction, dry mucous membranes. Minimal eye opening on examination with resistance to examiner eye opening. Swelling of left lower lip possibly 2/2 biting.  LUNGS: Normal respiratory effort. SaO2 100% on room air.  CV: Regular rate on telemetry, 1+ edema of bilateral lower extremities ABDOMEN: Soft, non-tender Ext: warm, without obvious deformity  NEURO:  Mental Status: Drowsy but wakes to voice. Alert to self. She is unable to correctly state the month of the year. She states incorrectly that she is 60 years old.  Speech is intermittently and inconsistently mildly dysarthric. She is not aphasic; no errors of grammar or syntax when answering questions. No neglect is noted on examination.  Cranial Nerves:  II: PERRL  2 mm / brisk. Will intermittently  fixate and track objects  III, IV, VI: EOMI when able to coach patient to keep eyes open. She resists examiner eye opening and intermittently does not open eyes to command.  V: Decreased sensation to cool temperature on the right face. Equivocal blink to threat present in bilateral temporal visual fields.  VII: Right side of mouth droops and nasolabial fold flattening present when face is at rest but with decreased elevation of the left mouth with smiling.  VIII: Hearing is intact to voice IX, X: Palate elevation is symmetric. Phonation normal.  XI: Shoulder shrug weak but symmetric  XII: Tongue protrudes midline without fasciculations.   Motor: Inconsistent strength testing throughout with repeated generalized giveway against resistance Best upper extremity strength 4/5, best lower extremity strength 3/5 bilaterally without focal weakness or asymmetry noted. Tone is normal. Bulk is normal.  Sensation: Decreased sensation to light touch and cool temperature on the right upper and lower extremity compared to the left.   Coordination: FTN intact on the right, functional ataxia noted on the left- patient unable to correctly target examiner's finger or her nose with eyes intermittently closed. HKS: incomplete excursion of each leg during the task with slow movements bilaterally but without ataxia out of proportion to weakness. DTRs: 2+ and symmetric biceps, brachioradialis, and patellar Plantar: Toes mute blaterally Gait: Deferred  NIHSS: 1a Level of Conscious.: 1; drowsy 1b LOC Questions: 2; unable to state age / month 1c LOC Commands: 0 2 Best Gaze: 0 3 Visual: 0 4 Facial Palsy: 2; partial right mouth and NLF flattening at rest on the right  5a Motor Arm - left: 1 5b Motor Arm - Right: 1 6a Motor Leg - Left: 1 6b Motor Leg - Right: 1 7 Limb Ataxia: 0 8 Sensory: 1; right sensory deficit 9 Best Language: 0 10 Dysarthria: 1; intermittent mild dysarthria 11  Extinct. and Inatten.: 0 TOTAL: 11  Labs I have reviewed labs in epic and the results pertinent to this consultation are: CBC    Component Value Date/Time   WBC 10.8 (H) 03/12/2021 1331   RBC 3.89 03/12/2021 1331   HGB 13.6 03/12/2021 1338   HCT 40.0 03/12/2021 1338   HCT 25.6 (L) 08/26/2019 0406   PLT 178 03/12/2021 1331   MCV 96.4 03/12/2021 1331   MCH 30.6 03/12/2021 1331   MCHC 31.7 03/12/2021 1331   RDW 16.5 (H) 03/12/2021 1331   LYMPHSABS 3.1 03/12/2021 1331   MONOABS 1.0 03/12/2021 1331   EOSABS 0.1 03/12/2021 1331   BASOSABS 0.0 03/12/2021 1331   CMP     Component Value Date/Time   NA 141 03/12/2021 1338   K 3.2 (L) 03/12/2021 1338   CL 106 03/12/2021 1338   CO2 28 03/12/2021 1331   GLUCOSE 77 03/12/2021 1338   BUN 20 03/12/2021 1338   CREATININE 1.10 (H) 03/12/2021 1338   CALCIUM 8.4 (L) 03/12/2021 1331   PROT 5.5 (L) 03/12/2021 1331   ALBUMIN 2.5 (L) 03/12/2021 1331   AST 36 03/12/2021 1331   ALT 29 03/12/2021 1331   ALKPHOS 72 03/12/2021 1331   BILITOT 0.6 03/12/2021 1331   GFRNONAA 57 (L) 03/12/2021 1331   GFRAA >60 06/30/2020 0144   Lipid Panel     Component Value Date/Time   CHOL 101 08/25/2019 1730   TRIG 50 08/25/2019 1730   HDL 59 08/25/2019 1730   CHOLHDL 1.7 08/25/2019 1730   VLDL 10 08/25/2019 1730   LDLCALC 32 08/25/2019 1730   Lab Results  Component  Value Date   HGBA1C 4.7 (L) 06/29/2020   Imaging I have reviewed the images obtained:  CT-scan of the brain: 1. No evidence of acute intracranial abnormality. ASPECTS is 10. 2. Mild chronic microvascular ischemic disease.  CT angio head and neck & CT cerebral perfusion:  No large vessel occlusion, hemodynamically significant stenosis, or evidence of dissection. Perfusion imaging demonstrates no evidence of core infarction or penumbra.  MRI examination of the brain pending  Assessment: 60 y.o. female with PMHx of CVA, HTN, migraine HA, gastric bypass 2017, and hypercholesteremia  who presented to the ED s/p fall this afternoon with various degrees of weakness and facial drooping.  - Stroke risk factors include history of stroke, HTN, hypercholesterolemia.  - tPA not given due to variability and inconsistency of last known well time and inconsistency in her physical examination with varying degrees of weakness and effort with some component of giveway weakness throughout. Not a candidate for IR thrombectomy due to vascular imaging and presentation without evidence of LVO.  - CTH without acute intracranial abnormality. CT angio without LVO, hemodynamically significant stenosis, or dissection. CTP without core infarction or penumbra. - Differential diagnosis is broad but could include stroke, functional weakness, polypharmacy, or toxic/metabolic encephalopathy. Vitamin deficiency encephalopathy also possible given history of gastric bypass and weight loss of > 125 lbs since 2017. Will obtain MRI brain without contrast and serum vitamin levels for further evaluation. Low suspicion of seizure at this time given no history of seizure, no LOC with fall as witnessed by sister, and no description of seizure-like motor activity.   Impression: Concern for encephalopathy  Inconsistent generalized weakness and dysarthria Functional weakness component  Recommendations: - MRI brain without contrast; patient with [employer] ICD that is MRI compatible  - Vitamin B12, thiamine, folate, magnesium, phosphorous, Vitamin D labs pending - Start the patient on a multivitamin - Stroke labs: HgbA1c, fasting lipid panel - Frequent neuro checks - Prophylactic therapy- Antiplatelet med: Aspirin - continue home ASA 81 mg PO  - Risk factor modification - Telemetry monitoring - PT consult, OT consult, Speech consult  Anibal Henderson, AGAC-NP [facility] Pager: [phone]  I have seen and examined the patient. I have formulated the assessment and recommendations. My exam findings were  observed and documented by Anibal Henderson, NP.  Electronically signed: Dr. Kerney Elbe

## 2021-03-12 NOTE — H&P (Signed)
History and Physical    Jodi Bowman JJO:[id number] DOB: 1961-05-19 DOA: 03/12/2021  PCP: Aletha Halim., PA-C Consultants:  [facility] - urology; [facility] - cardiology Patient coming from:  Home - lives with husband. Daughter, sister, nephew; NOK: Husband, [phone]  Chief Complaint: Weakness, facial droop  HPI: Jodi Bowman is a 60 y.o. female with medical history significant of fibromyalgia; s/p gastric bypass (2017); HLD; HTN; OSA; and CVA in 2000 presenting with weakness, facial droop.  Her husband reports that he works nights and came home about 1145 this AM and she was in the kitchen making lunch - had not eaten all day.  He heard her fall to the floor and he noticed a facial droop.  He fed her a protein shake which she drank without difficulty and she ate as well.  She has been very somnolent while in the ER.  She does sleep "pretty late" sometimes and he is unsure when she went to bed last night and she took "some morphine" - supposed to be weaning off the pain killers since her spinal cord stimulator and he is unsure how much she took.  She had an episode in November last year where she took too many pills (took the prescribed dose and it was too many) but he doesn't think that is the case this time (worse in November, she is able to talk some now).  She does not remember falling.  She is having hallucinations - talking to folks not there, which she has done in the ER.      ED Course: Stroke rule-out.  LKW last night/this AM.  Slurred speech this AM and then fell in kitchen, R facial droop and leaning to the R.  Very somnolent.  Called code stroke, neuro has seen, negative MRI/CTA.  Mild dysuria so UA added.  Review of Systems: unable to perform  Ambulatory Status:  Ambulates without assistance  COVID Vaccine Status:   Complete plus booster  Past Medical History:  Diagnosis Date   Arthritis    Calculus of gallbladder without mention of cholecystitis or obstruction     Dizziness    Fibromyalgia    GERD (gastroesophageal reflux disease)    Goiter    Hypercholesteremia    Hypertension    Lower back pain    Migraine    Nontoxic uninodular goiter    sees dr vollmer at [employer]   Obesity    PONV (postoperative nausea and vomiting)    Sleep apnea    STOPBANG=5   Stroke ([place]) 2000    Past Surgical History:  Procedure Laterality Date   ABDOMINAL HYSTERECTOMY     BOWEL RESECTION N/A 03/06/2018   Procedure: SMALL BOWEL ANASTAMOSIS;  Surgeon: Clovis Riley, MD;  Location: [place];  Service: General;  Laterality: N/A;   CATARACT EXTRACTION W/ INTRAOCULAR LENS IMPLANT Bilateral    CESAREAN SECTION  yrs ago   done x 2   CHOLECYSTECTOMY  01/05/2012   Procedure: LAPAROSCOPIC CHOLECYSTECTOMY WITH INTRAOPERATIVE CHOLANGIOGRAM;  Surgeon: Pedro Earls, MD;  Location: [facility];  Service: General;  Laterality: N/A;   COLONOSCOPY  10/08/2012   Procedure: COLONOSCOPY;  Surgeon: Beryle Beams, MD;  Location: [facility];  Service: Endoscopy;  Laterality: N/A;   KNEE ARTHROSCOPY  one 1995 and 1 in 1997   both knees done   LAPAROSCOPY N/A 03/06/2018   Procedure: LAPAROSCOPY DIAGNOSTIC WITH LYSIS OF ADHESIONS;  Surgeon: Clovis Riley, MD;  Location: [place];  Service: General;  Laterality: N/A;   LAPAROTOMY N/A 03/06/2018   Procedure: EXPLORATORY LAPAROTOMY, RESECTION OF DISTAL ROUX, CLOSURE OF INTERNAL HERNIA;  Surgeon: Clovis Riley, MD;  Location: [place];  Service: General;  Laterality: N/A;   LEFT HEART CATH AND CORONARY ANGIOGRAPHY N/A 08/29/2019   Procedure: LEFT HEART CATH AND CORONARY ANGIOGRAPHY;  Surgeon: Charolette Forward, MD;  Location: [facility];  Service: Cardiovascular;  Laterality: N/A;   LUMBAR WOUND DEBRIDEMENT N/A 04/03/2019   Procedure: LUMBAR WOUND WASHOUT;  Surgeon: Judith Part, MD;  Location: [place];  Service: Neurosurgery;  Laterality: N/A;   POSTERIOR LUMBAR FUSION  03/04/2019   surgery for endometriosis  yrs ago   thryoid  biopsy  December 01, 2011    at [facility] History   Marital status: Married    Spouse name: Not on file   Number of children: 2   Years of education: 14   Highest education level: Not on file  Occupational History   Occupation: Unemployed  Tobacco Use   Smoking status: Never   Smokeless tobacco: Never  Vaping Use   Vaping Use: Never used  Substance and Sexual Activity   Alcohol use: Not Currently    Alcohol/week: 0.0 standard drinks   Drug use: No   Sexual activity: Not Currently  Other Topics Concern   Not on file  Social History Narrative   Lives at home with family.   Right-handed.   No caffeine use.   Social Determinants of Health   Financial Resource Strain: Not on file  Food Insecurity: Not on file  Transportation Needs: Not on file  Physical Activity: Not on file  Stress: Not on file  Social Connections: Not on file  Intimate Partner Violence: Not on file    Allergies  Allergen Reactions   Shrimp [Shellfish Allergy] Anaphylaxis   Naproxen Other (See Comments)    Makes stomach cramp and burn badly    Family History  Problem Relation Age of Onset   Diabetes Mother    Hypertension Mother    Transient ischemic attack Mother    Seizures Mother    Dementia Mother    Other Father        MVA   Cancer Brother        colon and lung   Cancer Maternal Grandmother        colon    Prior to Admission medications   Medication Sig Start Date End Date Taking? Authorizing Provider  albuterol (PROVENTIL HFA) 108 (90 Base) MCG/ACT inhaler Inhale 1-2 puffs into the lungs every 6 (six) hours as needed for wheezing or shortness of breath.    [provider]  amLODipine (NORVASC) 5 MG tablet Take 5 mg by mouth daily. 01/14/20   [provider]  aspirin EC 81 MG tablet Take 81 mg by mouth daily.    [provider]  D3-50 1.25 MG (50000 UT) capsule Take 50,000 Units by mouth 3 (three) times a week. 05/06/20   [provider]  diclofenac Sodium (VOLTAREN) 1 % GEL Apply 4 g topically 2 (two) times daily as needed (back pain).    [provider]  DULoxetine (CYMBALTA) 60 MG capsule Take 60 mg by mouth daily. 08/11/19   [provider]  escitalopram (LEXAPRO) 10 MG tablet Take 10 mg by mouth at bedtime. 05/04/20   [provider]  ferrous sulfate 325 (65 FE) MG EC tablet Take 325 mg by mouth  daily. 07/26/19   [provider]  fluticasone (FLONASE) 50 MCG/ACT nasal spray Place 1 spray into both nostrils daily as needed for allergies.  11/08/17   [provider]  folic acid (FOLVITE) 1 MG tablet Take 1 mg by mouth daily. 10/14/17   [provider]  HYDROcodone-acetaminophen (NORCO) 10-325 MG tablet Take 1 tablet by mouth 5 (five) times daily as needed for moderate pain or severe pain.  02/16/20   [provider]  isosorbide mononitrate (IMDUR) 30 MG 24 hr tablet Take 1 tablet (30 mg total) by mouth daily. 11/18/19   Corena Herter, PA-C  loratadine (CLARITIN) 10 MG tablet Take 10 mg by mouth daily.     [provider]  losartan-hydrochlorothiazide (HYZAAR) 100-12.5 MG tablet Take 1 tablet by mouth daily. 01/13/20   [provider]  Magnesium Oxide 400 MG CAPS Take 1 capsule (400 mg total) by mouth daily. Patient not taking: Reported on 08/27/2020 09/28/19   Carlisle Cater, PA-C  metoprolol succinate (TOPROL-XL) 100 MG 24 hr tablet Take 100 mg by mouth at bedtime.  07/10/19   [provider]  mirtazapine (REMERON) 7.5 MG tablet Take 7.5 mg by mouth at bedtime. 07/30/20   [provider]  morphine (MS CONTIN) 30 MG 12 hr tablet Take 30 mg by mouth every 12 (twelve) hours. 06/14/20   [provider]  nitroGLYCERIN (NITROSTAT) 0.4 MG SL tablet Place 1 tablet (0.4 mg total) under the tongue every 5 (five) minutes x 3 doses as needed for chest pain. Patient taking differently: Place 0.4 mg under the tongue every 5  (five) minutes as needed for chest pain (MAXIMUM OF 3 DOSES).  03/13/14   Charolette Forward, MD  ondansetron (ZOFRAN-ODT) 4 MG disintegrating tablet Take 4 mg by mouth every 8 (eight) hours as needed for nausea/vomiting. 06/07/20   [provider]  pantoprazole (PROTONIX) 40 MG tablet Take 1 tablet (40 mg total) by mouth 2 (two) times daily. 03/15/19   Angiulli, Lavon Paganini, PA-C  polyethylene glycol (MIRALAX / GLYCOLAX) 17 g packet Take 17 g by mouth daily as needed for mild constipation. Patient not taking: Reported on 08/27/2020 03/08/19   Viona Gilmore D, NP  potassium chloride SA (KLOR-CON) 20 MEQ tablet Take 1 tablet (20 mEq total) by mouth 2 (two) times daily. 09/28/19   Carlisle Cater, PA-C  pregabalin (LYRICA) 75 MG capsule Take 1 capsule (75 mg total) by mouth 3 (three) times daily. 03/15/19   Angiulli, Lavon Paganini, PA-C  metoprolol (LOPRESSOR) 50 MG tablet Take 50 mg by mouth 2 (two) times daily.    12/04/11  [provider]  simvastatin (ZOCOR) 20 MG tablet Take 20 mg by mouth at bedtime.    12/04/11  [provider]    Physical Exam: Vitals:   03/12/21 1430 03/12/21 1445 03/12/21 1500 03/12/21 1515  BP: (!) 170/95 (!) 153/89 (!) 146/98 (!) 158/91  Pulse: 66     Resp: 12 13 17 12   Temp:      SpO2: 100%     Weight:      Height:         General:  Appears somnolent but able to wake up and answer limited questions Eyes:  PERRL, EOMI, normal lids, iris ENT:  grossly normal hearing, lips & tongue, mmm Neck:  no LAD, masses or thyromegaly Cardiovascular:  RRR, no m/r/g. No LE edema.  Respiratory:   CTA bilaterally with no wheezes/rales/rhonchi.  Normal respiratory effort. Abdomen:  soft, NT,  ND Skin:  no rash or induration seen on limited exam Musculoskeletal:  grossly normal tone BUE/BLE, no bony abnormality; overall mild weakness with 4/5 strength but appears to be related to sedation/exertion Psychiatric:  somnolent mood and affect, speech sparse but  appropriate Neurologic:  R facial droop, moves all extremities in coordinated fashion, sensation diminished along R forehead > face    Radiological Exams on Admission: Independently reviewed - see discussion in A/P where applicable  CT HEAD CODE STROKE WO CONTRAST  Result Date: 03/12/2021 CLINICAL DATA:  Code stroke.  Neuro deficit, acute stroke suspected. EXAM: CT HEAD WITHOUT CONTRAST TECHNIQUE: Contiguous axial images were obtained from the base of the skull through the vertex without intravenous contrast. COMPARISON:  CT head August 27, 2020 FINDINGS: Brain: No evidence of acute large vascular territory infarction, hemorrhage, hydrocephalus, extra-axial collection or mass lesion/mass effect. Mild patchy white matter hypoattenuation, most likely related chronic microvascular disease. Vascular: No hyperdense vessel identified. Skull: No acute fracture. Sinuses/Orbits: Small amount of fluid layering in the left maxillary sinus. Otherwise, clear visualized sinuses. Other: No mastoid effusions. ASPECTS [facility] Stroke Program Early CT Score) Total score (0-10 with 10 being normal): 10. IMPRESSION: 1. No evidence of acute intracranial abnormality. ASPECTS is 10. 2. Mild chronic microvascular ischemic disease. Code stroke imaging results were communicated on 03/12/2021 at 1:53 pm to provider Dr. Cheral Marker via secure text paging. Electronically Signed   By: Margaretha Sheffield MD   On: 03/12/2021 13:55   CT ANGIO HEAD NECK W WO CM W PERF (CODE STROKE)  Result Date: 03/12/2021 CLINICAL DATA:  Right facial droop EXAM: CT ANGIOGRAPHY HEAD AND NECK CT PERFUSION BRAIN TECHNIQUE: Multidetector CT imaging of the head and neck was performed using the standard protocol during bolus administration of intravenous contrast. Multiplanar CT image reconstructions and MIPs were obtained to evaluate the vascular anatomy. Carotid stenosis measurements (when applicable) are obtained utilizing NASCET criteria, using the distal  internal carotid diameter as the denominator. Multiphase CT imaging of the brain was performed following IV bolus contrast injection. Subsequent parametric perfusion maps were calculated using RAPID software. CONTRAST:  66mL OMNIPAQUE IOHEXOL 350 MG/ML SOLN COMPARISON:  None. FINDINGS: CTA NECK Aortic arch: Great vessel origins are patent. There is direct origin of the left artery from the arch. Right carotid system: Patent. Mild mixed plaque along the proximal internal carotid without stenosis. Left carotid system: Patent. Minimal mixed plaque along the proximal internal carotid without stenosis. Vertebral arteries: Patent. Right vertebral artery slightly dominant. No stenosis. Skeleton: Minor degenerative changes of the included spine. Other neck: Multinodular thyroid, with previous biopsy on the right in 2015. Upper chest: Included upper lungs are clear. Review of the MIP images confirms the above findings CTA HEAD Anterior circulation: Intracranial internal carotid arteries are patent with minimal calcified plaque. Anterior and middle cerebral arteries are patent. Posterior circulation: Intracranial vertebral arteries are patent. Basilar artery is patent. Major cerebellar artery origins are patent. Posterior communicating artery is identified on the left. Possible diminutive posterior communicating artery on the right. Posterior cerebral arteries are patent. Venous sinuses: Patent as allowed by contrast bolus timing. Review of the MIP images confirms the above findings CT Brain Perfusion Findings: CBF (<30%) Volume: 66mL Perfusion (Tmax>6.0s) volume: 20mL Mismatch Volume: 18mL Infarction Location: None IMPRESSION: No large vessel occlusion, hemodynamically significant stenosis, or evidence of dissection. Perfusion imaging demonstrates no evidence of core infarction or penumbra. These results were communicated to Dr. Cheral Marker at 2:11 pm on 03/12/2021 by text page via the  AMION messaging system. Electronically Signed    By: Macy Mis M.D.   On: 03/12/2021 14:21    EKG: Independently reviewed.   1250 - NSR with rate 65; no evidence of acute ischemia 1413 - NSR with rate 70; no evidence of acute ischemia   Labs on Admission: I have personally reviewed the available labs and imaging studies at the time of the admission.  Pertinent labs:   K+ 3.2 BUN 19/Creatinine 1.10/GFR 57 WBC 10.8 Hgb 11.9 INR 1.1 ETOH <10   Assessment/Plan Principal Problem:   Acute metabolic encephalopathy Active Problems:   Essential hypertension   Chronic pain syndrome   Mood disorder (HCC)   AMS -Patient presenting with a fall, R facial droop, concerning for TIA/CVA -Primary symptom currently is somnolence -Negative CT -In further discussion with the husband, patient sleeps irregular hours and he works nights so is uncertain when she went to bed last night -Additionally, she takes chronic narcotics and is supposed to be weaning these; she has a h/o unintentional overdose in 07/2020 -While TIA/CVA remains a consideration, hypersomnolence/medication misadventure are equally likely diagnoses -Aspirin has been given to reduce stroke mortality and decrease morbidity -Will place in observation status for CVA/TIA evaluation -Telemetry monitoring -MRI -Risk stratification with FLP, A1c; will also check UDS -Neurology consult -PT/OT/ST/Nutrition Consults  HTN -Allow permissive HTN for now -Treat BP only if >220/120, and then with goal of 15% reduction -Hold Norvasc, Hyzaar, Toprol and plan to restart in 48-72 hours   HLD -Check FLP -Consider addition of statin, as she does not currently appear to be taking   Chronic pain -Recent spinal cord stimulator implantation, now weaning off narcotics -I have reviewed this patient in the [place] Controlled Substances Reporting System.  She is receiving medications from two providers but appears to be taking them as prescribed. -She is at very high risk of opioid misuse,  diversion, or overdose. -Norco + MSIR + Lyrica may be a contributing factor to current presentation -Will hold sedating meds for now  Mood d/o -Resume Cymbalta, Remeron, and Lexapro when able to safely swallow  OSA -Continue CPAP     Note: This patient has been tested and is negative for the novel coronavirus COVID-19. She has been fully vaccinated against COVID-19.    DVT prophylaxis:  Lovenox  Code Status: DNR - confirmed with patient/family Family Communication: Husband present throughout evaluation Disposition Plan:  The patient is from: home  Anticipated d/c is to: home without [facility] services   Anticipated d/c date will depend on clinical response to treatment, but possibly as early as tomorrow if she has excellent response to treatment  Patient is currently: acutely ill Consults called: Neurology; PT/OT/ST/Nutrition; [facility] team Admission status: It is my clinical opinion that referral for OBSERVATION is reasonable and necessary in this patient based on the above information provided. The aforementioned taken together are felt to place the patient at high risk for further clinical deterioration. However it is anticipated that the patient may be medically stable for discharge from the hospital within 24 to 48 hours.     Karmen Bongo MD [facility]   How to contact the [facility] Attending or Consulting provider [place] or covering provider during after hours [place], for this patient?  Check the care team in [facility] and look for a) attending/consulting [facility] provider listed and b) the [facility] team listed Log into [email or web address] and use [place]'s universal password to access. If you do not have the password, please contact  the hospital operator. Locate the [facility] provider you are looking for under [facility] and page to a number that you can be directly reached. If you still have difficulty reaching the provider, please page the [facility] (Director on Call) for the Hospitalists listed on  amion for assistance.   03/12/2021, 5:00 PM

## 2021-03-12 NOTE — Code Documentation (Signed)
Stroke Response Nurse Documentation Code Documentation  Jodi Bowman is a 60 y.o. female arriving to [place] ED via Private Vehicle on 03/12/2021 with past medical hx of HTN, hypercholesteremia, Stroke, Migraine. Code stroke was activated by ED. Patient from home where she was LKW at 2130 last night when she went to bed per patient. When she woke up at 1030, she noted trouble walking. At 1130, her husband noted that the patient was lethargic and had slurred speech. After a fall, she was noted to have right sided facial droop.   Stroke team at the bedside on patient activation. Labs drawn and patient cleared for CT by PA. Patient to CT with team. NIHSS 12, see documentation for details and code stroke times. Patient with disoriented, right facial droop, bilateral arm weakness, bilateral leg weakness, right decreased sensation, and dysarthria  on exam. The following imaging was completed: CT, CTA head and neck, CTP. Patient is not a candidate for tPA due to being outside window.    Care/Plan: q2 mNIHSS/VS and MRI. Bedside handoff with ED RN Luiz Iron.    Kathrin Greathouse  Stroke Response RN

## 2021-03-12 NOTE — ED Provider Notes (Signed)
Emergency Medicine Provider Triage Evaluation Note  Jodi Bowman , a 60 y.o. female  was evaluated in triage.  Pt son complains of fall weakness.  Pt's family reports pt weak after trying to cook breakfast.  Pt's sister was with pt when pt fell around 11am  Pt is sleepy and weak now   Review of Systems  Positive: right facial droop, right arm weak Negative: fever  Physical Exam  BP (!) 158/93 (BP Location: Right Arm)   Pulse 65   Temp 98.3 F (36.8 C)   Resp 16   SpO2 99%  Gen:   Sleepy, right facial droop, food in mouth  Resp:  Normal effort  MSK:   Decreased grip right arm  Other:   Medical Decision Making  Medically screening exam initiated at 1:29 PM.  Appropriate orders placed.  Jodi Bowman was informed that the remainder of the evaluation will be completed by another provider, this initial triage assessment does not replace that evaluation, and the importance of remaining in the ED until their evaluation is complete.  Code stroke called,    Jodi Bowman 03/12/21 1334    Jodi Muskrat, MD 03/13/21 [phone]

## 2021-03-13 ENCOUNTER — Encounter (HOSPITAL_COMMUNITY): Payer: Self-pay | Admitting: Internal Medicine

## 2021-03-13 ENCOUNTER — Observation Stay (HOSPITAL_COMMUNITY): Payer: Medicare (Managed Care)

## 2021-03-13 DIAGNOSIS — I10 Essential (primary) hypertension: Secondary | ICD-10-CM | POA: Diagnosis not present

## 2021-03-13 LAB — URINALYSIS, ROUTINE W REFLEX MICROSCOPIC
Bilirubin Urine: NEGATIVE
Glucose, UA: NEGATIVE mg/dL
Hgb urine dipstick: NEGATIVE
Ketones, ur: NEGATIVE mg/dL
Nitrite: NEGATIVE
Protein, ur: NEGATIVE mg/dL
Specific Gravity, Urine: 1.018 (ref 1.005–1.030)
pH: 5 (ref 5.0–8.0)

## 2021-03-13 LAB — PHOSPHORUS: Phosphorus: 3.3 mg/dL (ref 2.5–4.6)

## 2021-03-13 LAB — BASIC METABOLIC PANEL
Anion gap: 6 (ref 5–15)
BUN: 19 mg/dL (ref 8–23)
CO2: 27 mmol/L (ref 22–32)
Calcium: 8.2 mg/dL — ABNORMAL LOW (ref 8.9–10.3)
Chloride: 108 mmol/L (ref 98–111)
Creatinine, Ser: 0.89 mg/dL (ref 0.44–1.00)
GFR, Estimated: >60 mL/min (ref >60)
Glucose, Bld: 84 mg/dL (ref 70–99)
Potassium: 3 mmol/L — ABNORMAL LOW (ref 3.5–5.1)
Sodium: 141 mmol/L (ref 135–145)

## 2021-03-13 LAB — RAPID URINE DRUG SCREEN, HOSP PERFORMED
Amphetamines: NOT DETECTED
Barbiturates: NOT DETECTED
Benzodiazepines: NOT DETECTED
Cocaine: NOT DETECTED
Opiates: POSITIVE — AB
Tetrahydrocannabinol: NOT DETECTED

## 2021-03-13 LAB — MAGNESIUM: Magnesium: 1.4 mg/dL — ABNORMAL LOW (ref 1.7–2.4)

## 2021-03-13 MED ORDER — POTASSIUM CHLORIDE CRYS ER 20 MEQ PO TBCR
40.0000 meq | EXTENDED_RELEASE_TABLET | ORAL | Status: AC
  Administered 2021-03-13 (×2): 40 meq via ORAL
  Filled 2021-03-13 (×3): qty 2

## 2021-03-13 MED ORDER — MAGNESIUM SULFATE 2 GM/50ML IV SOLN
2.0000 g | Freq: Once | INTRAVENOUS | Status: AC
  Administered 2021-03-13: 2 g via INTRAVENOUS
  Filled 2021-03-13: qty 50

## 2021-03-13 MED ORDER — ONDANSETRON HCL 4 MG/2ML IJ SOLN
4.0000 mg | Freq: Four times a day (QID) | INTRAMUSCULAR | Status: DC | PRN
  Administered 2021-03-14 – 2021-03-21 (×4): 4 mg via INTRAVENOUS
  Filled 2021-03-13 (×4): qty 2

## 2021-03-13 NOTE — Evaluation (Signed)
Physical Therapy Evaluation Patient Details Name: Jodi Bowman MRN: [id number] DOB: 1960-10-09 Today's Date: 03/13/2021   History of Present Illness  Pt is a 60 y/o female presenting on 03/12/2021 with weakness and facial droop after sustaining a fall. ED course includes stroke rule-out, negative MRI/CTA. PMH significant for fibromyalgia; s/p gastric bypass (2017); HLD; HTN; OSA; and CVA in 2000. Of note, pt with recent spinal cord stimulator implantation for chronic pain.  Clinical Impression  Pt demonstrates deficits in functional mobility, activity tolerance, strength, endurance, and balance. Pt tolerates bed mobility, transfers, and ambulation for short distances without requiring physical assistance to perform. Pt requires cues for hand placement and device management during session. Pt limited in distance ambulated secondary to onset of fatigue and back pain. Pt will benefit from acute PT services to improve strength and independence in mobility. SPT recommends HHPT to improve activity tolerance and aid in returning to prior level of function.     Follow Up Recommendations Home health PT    Equipment Recommendations  None recommended by PT    Recommendations for Other Services       Precautions / Restrictions Precautions Precautions: Fall;Other (comment) (Spinal cord stimulator) Precaution Comments: Spinal cord stimulator Restrictions Weight Bearing Restrictions: No      Mobility  Bed Mobility Overal bed mobility: Needs Assistance Bed Mobility: Supine to Sit;Sit to Supine     Supine to sit: Supervision Sit to supine: Supervision   General bed mobility comments: verbal cues to achieve sitting from supine with UEs and hand rail use.    Transfers Overall transfer level: Needs assistance Equipment used: Rolling walker (2 wheeled) Transfers: Sit to/from Stand Sit to Stand: Min guard         General transfer comment: min G for safety and cues for hand  placement.  Ambulation/Gait Ambulation/Gait assistance: Min guard Gait Distance (Feet): 30 Feet Assistive device: Rolling walker (2 wheeled) Gait Pattern/deviations: Step-through pattern;Decreased stride length Gait velocity: reduced Gait velocity interpretation: <1.8 ft/sec, indicate of risk for recurrent falls General Gait Details: Pt ambulates with slow gait speed with mild instability and no overt LOB noted. Pt reports feeling weak compared to baseline.  Stairs            Wheelchair Mobility    Modified Rankin (Stroke Patients Only)       Balance Overall balance assessment: Needs assistance Sitting-balance support: Feet supported;Bilateral upper extremity supported;Single extremity supported Sitting balance-Leahy Scale: Poor Sitting balance - Comments: Pt relies on UE support during sitting to maintain posture   Standing balance support: Bilateral upper extremity supported;Single extremity supported Standing balance-Leahy Scale: Poor Standing balance comment: Pt reliant on UE support from walker.                             Pertinent Vitals/Pain Pain Assessment: 0-10 Pain Score: 7  Pain Location: back Pain Descriptors / Indicators: Discomfort Pain Intervention(s): Monitored during session;Limited activity within patient's tolerance    Home Living Family/patient expects to be discharged to:: Private residence Living Arrangements: Spouse/significant other Available Help at Discharge: Family;Available 24 hours/day (spouse, sister) Type of Home: House Home Access: Stairs to enter Entrance Stairs-Rails: Left Entrance Stairs-Number of Steps: 5 Home Layout: One level Home Equipment: Walker - 2 wheels;Toilet riser;Shower seat;Cane - single point      Prior Function Level of Independence: Needs assistance   Gait / Transfers Assistance Needed: Pt uses SPC for mobility  ADL's / [employer]  Needed: Reports needing assistance for bathing,  dressing, grooming from sister.  Comments: Pt receives assistance from sister for ADLs.     Hand Dominance        Extremity/Trunk Assessment   Upper Extremity Assessment Upper Extremity Assessment: Defer to OT evaluation    Lower Extremity Assessment Lower Extremity Assessment: RLE deficits/detail;LLE deficits/detail RLE Deficits / Details: Pt with R LE weakness and decreased sensation to light touch. R hip flexion 3/5, R ankle dorsiflexion 3/5, R knee extension 3+/5, R ankle plantar flexion 3+/5. RLE Sensation: decreased light touch LLE Deficits / Details: L LE weakness. However, R LE> L LE weakness. L LE hip flexion 3+/5, knee flexion 4-/5, ankle plantar flexion 4-/5, ankle dorsiflexion 3+/5. LLE Sensation: WNL    Cervical / Trunk Assessment Cervical / Trunk Assessment: Normal  Communication   Communication: No difficulties  Cognition Arousal/Alertness: Awake/alert Behavior During Therapy: Flat affect Overall Cognitive Status: Within Functional Limits for tasks assessed                                        General Comments General comments (skin integrity, edema, etc.): VSS on RA.    Exercises     Assessment/Plan    PT Assessment Patient needs continued PT services  PT Problem List Decreased strength;Decreased activity tolerance;Decreased balance;Decreased mobility;Decreased knowledge of use of DME;Decreased safety awareness;Decreased knowledge of precautions;Impaired sensation;Pain       PT Treatment Interventions DME instruction;Gait training;Stair training;Functional mobility training;Therapeutic activities;Therapeutic exercise;Balance training;Patient/family education    PT Goals (Current goals can be found in the Care Plan section)  Acute Rehab PT Goals Patient Stated Goal: Get stronger and go home PT Goal Formulation: With patient Time For Goal Achievement: 03/27/21 Potential to Achieve Goals: Good    Frequency Min 3X/week   Barriers  to discharge        Co-evaluation               AM-PAC PT "6 Clicks" Mobility  Outcome Measure Help needed turning from your back to your side while in a flat bed without using bedrails?: A Little Help needed moving from lying on your back to sitting on the side of a flat bed without using bedrails?: A Little Help needed moving to and from a bed to a chair (including a wheelchair)?: A Little Help needed standing up from a chair using your arms (e.g., wheelchair or bedside chair)?: A Little Help needed to walk in hospital room?: A Little Help needed climbing 3-5 steps with a railing? : A Lot 6 Click Score: 17    End of Session Equipment Utilized During Treatment: Gait belt Activity Tolerance: Patient limited by fatigue Patient left: in bed;with call bell/phone within reach;with bed alarm set;with family/visitor present Nurse Communication: Mobility status PT Visit Diagnosis: Other abnormalities of gait and mobility (R26.89);Muscle weakness (generalized) (M62.81);History of falling (Z91.81);Difficulty in walking, not elsewhere classified (R26.2);Pain Pain - Right/Left:  (back) Pain - part of body:  (back)    Time: 4008-6761 PT Time Calculation (min) (ACUTE ONLY): 28 min   Charges:   PT Evaluation $PT Eval Low Complexity: 1 Low          Acute Rehab  Pager: [phone]   Garwin Brothers, SPT  03/13/2021, 11:00 AM

## 2021-03-13 NOTE — Care Management Obs Status (Signed)
[place] NOTIFICATION   Patient Details  Name: Jodi Bowman MRN: [id number] Date of Birth: [birth year]   Medicare Observation Status Notification Given:  Yes    Pollie Friar, RN 03/13/2021, 4:11 PM

## 2021-03-13 NOTE — Evaluation (Signed)
Speech Language Pathology Evaluation Patient Details Name: Jodi Bowman MRN: [id number] DOB: [birth year] Today's Date: 03/13/2021 Time: 0973-5329 SLP Time Calculation (min) (ACUTE ONLY): 23.02 min  Problem List:  Patient Active Problem List   Diagnosis Date Noted   Mood disorder ([place]) 03/12/2021   Weakness 92/42/6834   Acute metabolic encephalopathy 19/62/2297   Mental status change resolved 08/27/2020   Weakness of left side of body 06/29/2020   ACS (acute coronary syndrome) ([place]) 08/25/2019   Chest pain 08/25/2019   Postoperative seroma involving nervous system after nervous system procedure 04/05/2019   Drainage from wound 04/02/2019   Infective otitis externa of left ear    Left otitis media    Urinary retention    Orthostasis    Acute blood loss anemia    Fibromyalgia    Chronic pain syndrome    Lumbar radiculopathy 03/08/2019   Orthostatic hypotension 03/06/2019   Postoperative urinary retention 03/06/2019   Lumbar foraminal stenosis 03/04/2019   S/P exploratory laparotomy 03/06/2018   Bowel obstruction ([place]) 03/06/2018   Chest pain, rule out acute myocardial infarction 12/17/2017   Hypokalemia 12/17/2017   Acute lower UTI 12/17/2017   Vertigo 04/30/2017   Small vessel disease, cerebrovascular 04/30/2017   Chronic low back pain 08/01/2015   Right hip pain 08/01/2015   Abnormality of gait 08/01/2015   Left leg weakness    Low back pain with radiation    Syncope 07/30/2014   Atypical chest pain 03/12/2014   Lap chole IOC April 2013 01/29/2012   Gallstones 12/04/2011   Thyroid nodule-non neoplastic goiter by needle aspiration 12/04/2011   GLUCOSE INTOLERANCE 03/12/2010   DYSLIPIDEMIA 03/12/2010   Chronic migraine 03/12/2010   Carotid stenosis 03/12/2010   CEREBROVASCULAR ACCIDENT 03/12/2010   LIPOMA 01/29/2010   HEADACHE 01/29/2010   ANKLE INJURY, RIGHT 04/12/2009   PHARYNGITIS 03/21/2009   Backache 03/01/2009   ALLERGIC RHINITIS 03/17/2007   LOW BACK  PAIN 03/17/2007   Essential hypertension 01/01/2007   ANXIETY STATE NOS 09/11/2005   Past Medical History:  Past Medical History:  Diagnosis Date   Arthritis    Calculus of gallbladder without mention of cholecystitis or obstruction    Dizziness    Fibromyalgia    GERD (gastroesophageal reflux disease)    Goiter    Hypercholesteremia    Hypertension    Lower back pain    Migraine    Nontoxic uninodular goiter    sees dr vollmer at [facility]   Obesity    PONV (postoperative nausea and vomiting)    Sleep apnea    STOPBANG=5   Stroke ([place]) 2000   Past Surgical History:  Past Surgical History:  Procedure Laterality Date   ABDOMINAL HYSTERECTOMY     BOWEL RESECTION N/A 03/06/2018   Procedure: SMALL BOWEL ANASTAMOSIS;  Surgeon: Clovis Riley, MD;  Location: [place];  Service: General;  Laterality: N/A;   CATARACT EXTRACTION W/ INTRAOCULAR LENS IMPLANT Bilateral    CESAREAN SECTION  yrs ago   done x 2   CHOLECYSTECTOMY  01/05/2012   Procedure: LAPAROSCOPIC CHOLECYSTECTOMY WITH INTRAOPERATIVE CHOLANGIOGRAM;  Surgeon: Pedro Earls, MD;  Location: [facility];  Service: General;  Laterality: N/A;   COLONOSCOPY  10/08/2012   Procedure: COLONOSCOPY;  Surgeon: Beryle Beams, MD;  Location: [facility];  Service: Endoscopy;  Laterality: N/A;   KNEE ARTHROSCOPY  one 1995 and 1 in 1997   both knees done   LAPAROSCOPY N/A 03/06/2018   Procedure: LAPAROSCOPY DIAGNOSTIC WITH LYSIS OF ADHESIONS;  Surgeon: Clovis Riley, MD;  Location: [place];  Service: General;  Laterality: N/A;   LAPAROTOMY N/A 03/06/2018   Procedure: EXPLORATORY LAPAROTOMY, RESECTION OF DISTAL ROUX, CLOSURE OF INTERNAL HERNIA;  Surgeon: Clovis Riley, MD;  Location: [place];  Service: General;  Laterality: N/A;   LEFT HEART CATH AND CORONARY ANGIOGRAPHY N/A 08/29/2019   Procedure: LEFT HEART CATH AND CORONARY ANGIOGRAPHY;  Surgeon: Charolette Forward, MD;  Location: [facility];  Service: Cardiovascular;  Laterality:  N/A;   LUMBAR WOUND DEBRIDEMENT N/A 04/03/2019   Procedure: LUMBAR WOUND WASHOUT;  Surgeon: Judith Part, MD;  Location: [place];  Service: Neurosurgery;  Laterality: N/A;   POSTERIOR LUMBAR FUSION  03/04/2019   surgery for endometriosis  yrs ago   thryoid biopsy  December 01, 2011    at [facility]   HPI:  Pt is a 60 y/o female who presented on 03/12/2021 with weakness and facial droop after sustaining a fall. CT head was negative. MRI brain ordered, but not completed yet at time of evaluation. PMH significant for fibromyalgia, s/p gastric bypass (2017), HLD, HTN, OSA, and CVA in 2000.   Assessment / Plan / Recommendation Clinical Impression  Pt participated in speech/language/cognition evaluation. Pt denied any baseline deficits in speech, language, or cognition. She stated that she is retired and was living with her husband and daughter prior to admission. She reported that her voice now sounds "raspy" and that she feels confused, but she denied any other acute changes in speech, language, or cognition. [facility] Mental Status Examination was completed to evaluate the pt's cognitive-linguistic skills. She achieved a score of 14/30 which is below the normal limits of 27 or more out of 30. She exhibited deficits in the areas of awareness, attention, memory, complex problem solving, and executive function. Motor speech and language skills were [facility]. Skilled SLP services are clinically indicated at this time to improve cognitive-linguistic function.    SLP Assessment  SLP Recommendation/Assessment: Patient needs continued Speech Lanaguage Pathology Services SLP Visit Diagnosis: Cognitive communication deficit (R41.841)    Follow Up Recommendations  Home health SLP    Frequency and Duration min 2x/week  2 weeks      SLP Evaluation Cognition  Overall Cognitive Status: Impaired/Different from baseline Arousal/Alertness: Awake/alert Orientation Level: Oriented to person;Oriented to  place;Disoriented to time;Disoriented to situation Attention: Focused;Sustained Focused Attention: Appears intact Sustained Attention: Impaired Sustained Attention Impairment: Verbal complex Memory: Impaired Memory Impairment: Retrieval deficit;Decreased recall of new information (Immediate: 5/5; delayed: 0/5; with cues: 1/5) Awareness: Impaired Awareness Impairment: Emergent impairment Problem Solving: Impaired Problem Solving Impairment: Verbal complex Executive Function: Organizing;Sequencing Sequencing: Impaired Sequencing Impairment: Verbal complex (Clock drawing: 2/4) Organizing: Impaired Organizing Impairment: Verbal complex (Backward digit span: 1/2)       Comprehension  Auditory Comprehension Overall Auditory Comprehension: Appears within functional limits for tasks assessed Yes/No Questions: Within Functional Limits Commands: Within Functional Limits Conversation: Simple    Expression Expression Primary Mode of Expression: Verbal Verbal Expression Overall Verbal Expression: Appears within functional limits for tasks assessed Initiation: No impairment Level of Generative/Spontaneous Verbalization: Conversation Naming: No impairment Pragmatics: Impairment Impairments: Eye contact   Oral / Motor  Oral Motor/Sensory Function Overall Oral Motor/Sensory Function: Within functional limits Motor Speech Overall Motor Speech: Appears within functional limits for tasks assessed Respiration: Within functional limits Phonation: Normal Resonance: Within functional limits Articulation: Within functional limitis Intelligibility: Intelligible Motor Planning: Witnin functional limits Motor Speech Errors: Not applicable   Lovell Roe I. Hardin Negus,  MS, [place] Office number [phone] Pager [place] 03/13/2021, 3:39 PM

## 2021-03-13 NOTE — Progress Notes (Addendum)
Progress Note    Jodi Bowman  BCW:[id number] DOB: 02-04-1961  DOA: 03/12/2021 PCP: Aletha Halim., PA-C      Brief Narrative:    Medical records reviewed and are as summarized below:  Jodi Bowman is a 60 y.o. female with medical history significant of fibromyalgia; s/p gastric bypass (2017); HLD; HTN; OSA, chronic back pain s/p spinal cord stimulator and CVA in 2000.  She was brought to the hospital because of confusion, hallucinations and a fall at home.  Her husband said he noticed a facial droop.  She is on opiate analgesics and she is being weaned off of opioids since placement of a spinal cord stimulator.     Assessment/Plan:   Principal Problem:   Acute metabolic encephalopathy Active Problems:   Essential hypertension   Chronic pain syndrome   Mood disorder (HCC)    Body mass index is 28.5 kg/m.  Altered mental status, right facial droop, suspected acute stroke: CT head did not show any acute abnormality.  MRI brain is pending.  Continue aspirin.  Follow-up with neurologist.  Monitor on telemetry.  Hypertension: Allow permissive hypertension.  Hold antihypertensives (Norvasc, Hyzaar, Toprol)  Hyperlipidemia: LDL 32, HDL 68, triglycerides 21, total cholesterol 104  Hypokalemia and hypomagnesemia: Replete potassium and magnesium.  Monitor levels.  Chronic back pain: S/p spinal cord stimulator in place.  Analgesics as needed for pain.  OSA: Continue CPAP at night.  Mood disorder: Continue psychotropics   Diet Order             Diet Heart Room service appropriate? Yes; Fluid consistency: Thin  Diet effective now                      Consultants: Neurologist  Procedures: None    Medications:     stroke: mapping our early stages of recovery book   Does not apply Once   enoxaparin (LOVENOX) injection  40 mg Subcutaneous Q24H   lidocaine  1 patch Transdermal Q24H   potassium chloride  40 mEq Oral Q4H   Continuous  Infusions:  sodium chloride     magnesium sulfate bolus IVPB       Anti-infectives (From admission, onward)    None              Family Communication/Anticipated D/C date and plan/Code Status   DVT prophylaxis: enoxaparin (LOVENOX) injection 40 mg Start: 03/12/21 2200     Code Status: DNR  Family Communication: Her husband at the bedside. Disposition Plan:    Status is: Observation  The patient will require care spanning > 2 midnights and should be moved to inpatient because: Inpatient level of care appropriate due to severity of illness  Dispo: The patient is from: Home              Anticipated d/c is to: Home              Patient currently is not medically stable to d/c.   Difficult to place patient No           Subjective:   Interval events noted.  C/o leg weakness, abdominal pain and difficulty urinating.  Bladder scan showed 802 mls of urine in the bladder.  Objective:    Vitals:   03/13/21 0100 03/13/21 0354 03/13/21 0918 03/13/21 1250  BP: (!) 150/83 126/75 112/73 124/75  Pulse: 71 72 67 72  Resp: 17 16 18 18   Temp: 97.9 F (36.6 C) 98.4  F (36.9 C) 98.4 F (36.9 C) 97.9 F (36.6 C)  TempSrc: Oral Oral Oral Oral  SpO2: 98% 100% 100% 100%  Weight:      Height:       No data found.   Intake/Output Summary (Last 24 hours) at 03/13/2021 1503 Last data filed at 03/13/2021 1100 Gross per 24 hour  Intake --  Output 850 ml  Net -850 ml   Filed Weights   03/12/21 1411  Weight: 64 kg    Exam:  GEN: NAD SKIN: Warm and dry EYES: No pallor or icterus ENT: MMM CV: RRR PULM: CTA B ABD: soft, ND, suprapubic tenderness, +BS CNS: AAO x 3,  EXT: B/l leg edema (1+), no tenderness        Data Reviewed:   I have personally reviewed following labs and imaging studies:  Labs: Labs show the following:   Basic Metabolic Panel: Recent Labs  Lab 03/12/21 1331 03/12/21 1338 03/12/21 1932 03/13/21 0953  NA 139 141  --  141   K 3.2* 3.2*  --  3.0*  CL 105 106  --  108  CO2 28  --   --  27  GLUCOSE 84 77  --  84  BUN 19 20  --  19  CREATININE 1.10* 1.10*  --  0.89  CALCIUM 8.4*  --   --  8.2*  MG  --   --  1.6* 1.4*  PHOS  --   --  3.5 3.3   GFR Estimated Creatinine Clearance: 54 mL/min (by C-G formula based on SCr of 0.89 mg/dL). Liver Function Tests: Recent Labs  Lab 03/12/21 1331  AST 36  ALT 29  ALKPHOS 72  BILITOT 0.6  PROT 5.5*  ALBUMIN 2.5*   No results for input(s): LIPASE, AMYLASE in the last 168 hours. No results for input(s): AMMONIA in the last 168 hours. Coagulation profile Recent Labs  Lab 03/12/21 1331  INR 1.1    CBC: Recent Labs  Lab 03/12/21 1331 03/12/21 1338  WBC 10.8*  --   NEUTROABS 6.6  --   HGB 11.9* 13.6  HCT 37.5 40.0  MCV 96.4  --   PLT 178  --    Cardiac Enzymes: No results for input(s): CKTOTAL, CKMB, CKMBINDEX, TROPONINI in the last 168 hours. BNP (last 3 results) No results for input(s): PROBNP in the last 8760 hours. CBG: Recent Labs  Lab 03/12/21 1324  GLUCAP 93   D-Dimer: No results for input(s): DDIMER in the last 72 hours. Hgb A1c: No results for input(s): HGBA1C in the last 72 hours. Lipid Profile: Recent Labs    03/12/21 1933  CHOL 104  HDL 68  LDLCALC 32  TRIG 21  CHOLHDL 1.5   Thyroid function studies: No results for input(s): TSH, T4TOTAL, T3FREE, THYROIDAB in the last 72 hours.  Invalid input(s): FREET3 Anemia work up: Recent Labs    03/12/21 1932 03/12/21 1933  VITAMINB12 341  --   FOLATE  --  18.9   Sepsis Labs: Recent Labs  Lab 03/12/21 1331  WBC 10.8*    Microbiology Recent Results (from the past 240 hour(s))  Resp Panel by RT-PCR (Flu A&B, Covid) Nasopharyngeal Swab     Status: None   Collection Time: 03/12/21  1:31 PM   Specimen: Nasopharyngeal Swab; Nasopharyngeal(NP) swabs in vial transport medium  Result Value Ref Range Status   SARS Coronavirus 2 by RT PCR NEGATIVE NEGATIVE Final    Comment:  (NOTE) SARS-CoV-2 target nucleic acids  are NOT DETECTED.  The SARS-CoV-2 RNA is generally detectable in upper respiratory specimens during the acute phase of infection. The lowest concentration of SARS-CoV-2 viral copies this assay can detect is 138 copies/mL. A negative result does not preclude SARS-Cov-2 infection and should not be used as the sole basis for treatment or other patient management decisions. A negative result may occur with  improper specimen collection/handling, submission of specimen other than nasopharyngeal swab, presence of viral mutation(s) within the areas targeted by this assay, and inadequate number of viral copies(<138 copies/mL). A negative result must be combined with clinical observations, patient history, and epidemiological information. The expected result is Negative.  Fact Sheet for Patients:  EntrepreneurPulse.com.au  Fact Sheet for Healthcare Providers:  IncredibleEmployment.be  This test is no t yet approved or cleared by the [place] FDA and  has been authorized for detection and/or diagnosis of SARS-CoV-2 by FDA under an Emergency Use Authorization (EUA). This EUA will remain  in effect (meaning this test can be used) for the duration of the COVID-19 declaration under Section 564(b)(1) of the Act, 21 U.S.C.section 360bbb-3(b)(1), unless the authorization is terminated  or revoked sooner.       Influenza A by PCR NEGATIVE NEGATIVE Final   Influenza B by PCR NEGATIVE NEGATIVE Final    Comment: (NOTE) The Xpert Xpress SARS-CoV-2/FLU/RSV plus assay is intended as an aid in the diagnosis of influenza from Nasopharyngeal swab specimens and should not be used as a sole basis for treatment. Nasal washings and aspirates are unacceptable for Xpert Xpress SARS-CoV-2/FLU/RSV testing.  Fact Sheet for Patients: EntrepreneurPulse.com.au  Fact Sheet for Healthcare  Providers: IncredibleEmployment.be  This test is not yet approved or cleared by the [place] FDA and has been authorized for detection and/or diagnosis of SARS-CoV-2 by FDA under an Emergency Use Authorization (EUA). This EUA will remain in effect (meaning this test can be used) for the duration of the COVID-19 declaration under Section 564(b)(1) of the Act, 21 U.S.C. section 360bbb-3(b)(1), unless the authorization is terminated or revoked.  Performed at [facility], [place] [address], [place], [place] [address]     Procedures and diagnostic studies:  CT HEAD CODE STROKE WO CONTRAST  Result Date: 03/12/2021 CLINICAL DATA:  Code stroke.  Neuro deficit, acute stroke suspected. EXAM: CT HEAD WITHOUT CONTRAST TECHNIQUE: Contiguous axial images were obtained from the base of the skull through the vertex without intravenous contrast. COMPARISON:  CT head August 27, 2020 FINDINGS: Brain: No evidence of acute large vascular territory infarction, hemorrhage, hydrocephalus, extra-axial collection or mass lesion/mass effect. Mild patchy white matter hypoattenuation, most likely related chronic microvascular disease. Vascular: No hyperdense vessel identified. Skull: No acute fracture. Sinuses/Orbits: Small amount of fluid layering in the left maxillary sinus. Otherwise, clear visualized sinuses. Other: No mastoid effusions. ASPECTS [facility] Stroke Program Early CT Score) Total score (0-10 with 10 being normal): 10. IMPRESSION: 1. No evidence of acute intracranial abnormality. ASPECTS is 10. 2. Mild chronic microvascular ischemic disease. Code stroke imaging results were communicated on 03/12/2021 at 1:53 pm to provider Dr. Cheral Marker via secure text paging. Electronically Signed   By: Margaretha Sheffield MD   On: 03/12/2021 13:55   CT ANGIO HEAD NECK W WO CM W PERF (CODE STROKE)  Result Date: 03/12/2021 CLINICAL DATA:  Right facial droop EXAM: CT ANGIOGRAPHY HEAD AND NECK CT  PERFUSION BRAIN TECHNIQUE: Multidetector CT imaging of the head and neck was performed using the standard protocol during bolus administration of intravenous contrast.  Multiplanar CT image reconstructions and MIPs were obtained to evaluate the vascular anatomy. Carotid stenosis measurements (when applicable) are obtained utilizing NASCET criteria, using the distal internal carotid diameter as the denominator. Multiphase CT imaging of the brain was performed following IV bolus contrast injection. Subsequent parametric perfusion maps were calculated using RAPID software. CONTRAST:  92mL OMNIPAQUE IOHEXOL 350 MG/ML SOLN COMPARISON:  None. FINDINGS: CTA NECK Aortic arch: Great vessel origins are patent. There is direct origin of the left artery from the arch. Right carotid system: Patent. Mild mixed plaque along the proximal internal carotid without stenosis. Left carotid system: Patent. Minimal mixed plaque along the proximal internal carotid without stenosis. Vertebral arteries: Patent. Right vertebral artery slightly dominant. No stenosis. Skeleton: Minor degenerative changes of the included spine. Other neck: Multinodular thyroid, with previous biopsy on the right in 2015. Upper chest: Included upper lungs are clear. Review of the MIP images confirms the above findings CTA HEAD Anterior circulation: Intracranial internal carotid arteries are patent with minimal calcified plaque. Anterior and middle cerebral arteries are patent. Posterior circulation: Intracranial vertebral arteries are patent. Basilar artery is patent. Major cerebellar artery origins are patent. Posterior communicating artery is identified on the left. Possible diminutive posterior communicating artery on the right. Posterior cerebral arteries are patent. Venous sinuses: Patent as allowed by contrast bolus timing. Review of the MIP images confirms the above findings CT Brain Perfusion Findings: CBF (<30%) Volume: 59mL Perfusion (Tmax>6.0s) volume:  64mL Mismatch Volume: 77mL Infarction Location: None IMPRESSION: No large vessel occlusion, hemodynamically significant stenosis, or evidence of dissection. Perfusion imaging demonstrates no evidence of core infarction or penumbra. These results were communicated to Dr. Cheral Marker at 2:11 pm on 03/12/2021 by text page via the [facility] messaging system. Electronically Signed   By: Macy Mis M.D.   On: 03/12/2021 14:21               LOS: 0 days   Renelle Stegenga  [facility]   Pager on [email or web address]. If 7PM-7AM, please contact night-coverage at [email or web address]     03/13/2021, 3:03 PM

## 2021-03-13 NOTE — Plan of Care (Signed)

## 2021-03-13 NOTE — Evaluation (Signed)
Occupational Therapy Evaluation Patient Details Name: Jodi Bowman MRN: [id number] DOB: April 10, 1961 Today's Date: 03/13/2021    History of Present Illness Pt is a 60 y/o female presenting on 03/12/2021 with weakness and facial droop after sustaining a fall. ED course includes stroke rule-out, negative MRI/CTA. PMH significant for fibromyalgia; s/p gastric bypass (2017); HLD; HTN; OSA; and CVA in 2000. Of note, pt with recent spinal cord stimulator implantation for chronic pain.   Clinical Impression   Patient admitted for the above diagnosis.  PTA she lives with her spouse, who works during the day, and her sister helps her when the spouse is at work.  She reports walking with a RW in the home, but she does have a SPC as well.  Her sister assists with ADL and IADL supports, and her spouse assist as needed including community mobility.  Given the level of supports she has at home, home with [facility] services is reasonable.  Currently she is needing up to mod A for lower body ADL, close to her reported baseline, and up to [place] A for basic mobility.  OT will follow her in the acute setting to maximize her functional status.      Follow Up Recommendations  No OT follow up    Equipment Recommendations  Other (comment) (Reacher, sock aid)    Recommendations for Other Services       Precautions / Restrictions Precautions Precautions: Fall;Other (comment) Precaution Comments: Spinal cord stimulator with chronic back pain Restrictions Weight Bearing Restrictions: No      Mobility Bed Mobility Overal bed mobility: Needs Assistance Bed Mobility: Supine to Sit;Sit to Supine     Supine to sit: Supervision;HOB elevated Sit to supine: Min assist   General bed mobility comments: assist to bring feet back onto the bed.  Able to adjust herself for comfort independently. Patient Response: Flat affect  Transfers Overall transfer level: Needs assistance Equipment used: Rolling walker (2  wheeled) Transfers: Sit to/from Stand Sit to Stand: Min guard              Balance Overall balance assessment: Needs assistance Sitting-balance support: Feet supported;Single extremity supported Sitting balance-Leahy Scale: Fair     Standing balance support: Bilateral upper extremity supported;Single extremity supported Standing balance-Leahy Scale: Poor Standing balance comment: Pt reliant on UE support from walker.  Leaning into counter for grooming with hands off RW.                           ADL either performed or assessed with clinical judgement   ADL Overall ADL's : Needs assistance/impaired     Grooming: Wash/dry hands;Min guard;Standing Grooming Details (indicate cue type and reason): RW level             Lower Body Dressing: Moderate assistance;Sitting/lateral leans   Toilet Transfer: Minimal Therapist, art Details (indicate cue type and reason): slowed pace Toileting- Clothing Manipulation and Hygiene: Supervision/safety;Sitting/lateral lean       Functional mobility during ADLs: Minimal assistance;Rolling walker       Vision Patient Visual Report: No change from baseline       Perception     Praxis      Pertinent Vitals/Pain Pain Assessment: Faces Faces Pain Scale: Hurts a little bit Pain Location: back Pain Descriptors / Indicators: Discomfort Pain Intervention(s): Monitored during session     Hand Dominance Right   Extremity/Trunk Assessment Upper Extremity Assessment Upper Extremity Assessment: Generalized weakness  Lower Extremity Assessment Lower Extremity Assessment: Defer to PT evaluation   Cervical / Trunk Assessment Cervical / Trunk Assessment: Normal   Communication Communication Communication: No difficulties   Cognition Arousal/Alertness: Awake/alert Behavior During Therapy: Flat affect Overall Cognitive Status: Impaired/Different from baseline Area of Impairment: Following  commands;Problem solving                       Following Commands: Follows one step commands consistently;Follows multi-step commands with increased time     Problem Solving: Slow processing;Decreased initiation     General Comments       Exercises     Shoulder Instructions      Home Living Family/patient expects to be discharged to:: Private residence Living Arrangements: Spouse/significant other Available Help at Discharge: Family;Available 24 hours/day Type of Home: House Home Access: Stairs to enter [employer] of Steps: 5 Entrance Stairs-Rails: Left Home Layout: One level     Bathroom Shower/Tub: Tub/shower unit;Walk-in shower   Bathroom Toilet: Standard Bathroom Accessibility: Yes How Accessible: Accessible via walker Home Equipment: Campbell - 2 wheels;Toilet riser;Shower seat;Cane - single point;Adaptive equipment Adaptive Equipment: Long-handled sponge Additional Comments: Sister assists with home management, meals and ADL during the day.  Spouse assists with IADL after work/nights..  Lives With: Spouse    Prior Functioning/Environment Level of Independence: Needs assistance  Gait / Transfers Assistance Needed: Pt uses SPC for mobility ADL's / Homemaking Assistance Needed: Reports needing assistance for bathing, dressing, grooming from sister, up to min to mod A depending on her back pain.            OT Problem List: Decreased activity tolerance;Impaired balance (sitting and/or standing);Decreased safety awareness;Pain      OT Treatment/Interventions: Self-care/ADL training;DME and/or AE instruction;Balance training;Therapeutic activities    OT Goals(Current goals can be found in the care plan section) Acute Rehab OT Goals Patient Stated Goal: Get back home OT Goal Formulation: With patient Time For Goal Achievement: 03/27/21 Potential to Achieve Goals: Good ADL Goals Pt Will Perform Grooming: with set-up;standing Pt Will Perform  Upper Body Bathing: with set-up;sitting;standing Pt Will Perform Lower Body Bathing: with min assist;sit to/from stand Pt Will Perform Upper Body Dressing: with set-up;sitting;standing Pt Will Perform Lower Body Dressing: with min assist;sit to/from stand Pt Will Transfer to Toilet: with modified independence;ambulating;regular height toilet Pt Will Perform Toileting - Clothing Manipulation and hygiene: with modified independence;sit to/from stand;sitting/lateral leans  OT Frequency: Min 2X/week   Barriers to D/C:    none noted       Co-evaluation              AM-PAC OT "6 Clicks" Daily Activity     Outcome Measure Help from another person eating meals?: None Help from another person taking care of personal grooming?: A Little Help from another person toileting, which includes using toliet, bedpan, or urinal?: A Little Help from another person bathing (including washing, rinsing, drying)?: A Lot Help from another person to put on and taking off regular upper body clothing?: A Little Help from another person to put on and taking off regular lower body clothing?: A Lot 6 Click Score: 17   End of Session Equipment Utilized During Treatment: Surveyor, mining Communication: Mobility status  Activity Tolerance: Patient tolerated treatment well Patient left: in bed;with call bell/phone within reach;with bed alarm set  OT Visit Diagnosis: Unsteadiness on feet (R26.81);History of falling (Z91.81);Pain Pain - Right/Left:  (chronic back)  Time: 3475-8307 OT Time Calculation (min): 19 min Charges:  OT General Charges $OT Visit: 1 Visit OT Evaluation $OT Eval Moderate Complexity: 1 Mod  03/13/2021  Rich, OTR/L  Acute Rehabilitation Services  Office:  [place] 03/13/2021, 4:15 PM

## 2021-03-13 NOTE — TOC Initial Note (Signed)
Transition of Care [facility]) - Initial/Assessment Note    Patient Details  Name: Jodi Bowman MRN: [id number] Date of Birth: 05/28/1961  [facility]) CM/SW Contact:    Pollie Friar, RN Phone Number: 03/13/2021, 4:20 PM  Clinical Narrative:                 Patient lives at home with spouse, sister and daughter. She has supervision at home.  Recommendations for [facility] services. Pt without a preference on agency. CM working to get it arranged.  Pt denies issues with home medications or transportation.  TOC following.  Expected Discharge Plan: [place] Barriers to Discharge: Continued Medical Work up   Patient Goals and CMS Choice   CMS Medicare.gov Compare Post Acute Care list provided to:: Patient Choice offered to / list presented to : Patient  Expected Discharge Plan and Services Expected Discharge Plan: [place]   Discharge Planning Services: CM Consult Post Acute Care Choice: [place] arrangements for the past 2 months: Single Family Home                           HH Arranged: PT, OT, Speech Therapy HH Agency: [place] ([facility])        Prior Living Arrangements/Services Living arrangements for the past 2 months: Single Family Home Lives with:: Spouse, Siblings Patient language and need for interpreter reviewed:: Yes Do you feel safe going back to the place where you live?: Yes      Need for Family Participation in Patient Care: Yes (Comment) Care giver support system in place?: Yes (comment) Current home services: DME (cane, walker, shower seat, 3 in 1) Criminal Activity/Legal Involvement Pertinent to Current Situation/Hospitalization: No - Comment as needed  Activities of Daily Living      Permission Sought/Granted                  Emotional Assessment Appearance:: Appears stated age Attitude/Demeanor/Rapport: Engaged Affect (typically observed): Accepting Orientation: :  Oriented to Self, Oriented to Place, Oriented to Situation   Psych Involvement: No (comment)  Admission diagnosis:  TIA (transient ischemic attack) [G45.9] Altered mental status, unspecified altered mental status type [R41.82] Patient Active Problem List   Diagnosis Date Noted   Mood disorder ([place]) 03/12/2021   Weakness 71/24/5809   Acute metabolic encephalopathy 98/33/8250   Mental status change resolved 08/27/2020   Weakness of left side of body 06/29/2020   ACS (acute coronary syndrome) ([place]) 08/25/2019   Chest pain 08/25/2019   Postoperative seroma involving nervous system after nervous system procedure 04/05/2019   Drainage from wound 04/02/2019   Infective otitis externa of left ear    Left otitis media    Urinary retention    Orthostasis    Acute blood loss anemia    Fibromyalgia    Chronic pain syndrome    Lumbar radiculopathy 03/08/2019   Orthostatic hypotension 03/06/2019   Postoperative urinary retention 03/06/2019   Lumbar foraminal stenosis 03/04/2019   S/P exploratory laparotomy 03/06/2018   Bowel obstruction ([place]) 03/06/2018   Chest pain, rule out acute myocardial infarction 12/17/2017   Hypokalemia 12/17/2017   Acute lower UTI 12/17/2017   Vertigo 04/30/2017   Small vessel disease, cerebrovascular 04/30/2017   Chronic low back pain 08/01/2015   Right hip pain 08/01/2015   Abnormality of gait 08/01/2015   Left leg weakness    Low back pain with radiation  Syncope 07/30/2014   Atypical chest pain 03/12/2014   Lap chole IOC April 2013 01/29/2012   Gallstones 12/04/2011   Thyroid nodule-non neoplastic goiter by needle aspiration 12/04/2011   GLUCOSE INTOLERANCE 03/12/2010   DYSLIPIDEMIA 03/12/2010   Chronic migraine 03/12/2010   Carotid stenosis 03/12/2010   CEREBROVASCULAR ACCIDENT 03/12/2010   LIPOMA 01/29/2010   HEADACHE 01/29/2010   ANKLE INJURY, RIGHT 04/12/2009   PHARYNGITIS 03/21/2009   Backache 03/01/2009   ALLERGIC RHINITIS 03/17/2007    LOW BACK PAIN 03/17/2007   Essential hypertension 01/01/2007   ANXIETY STATE NOS 09/11/2005   PCP:  Aletha Halim., PA-C Pharmacy:   [id number] - [place], [place] [address] [place] [address] Phone: [phone] Fax: [phone]     Social Determinants of Health (SDOH) Interventions    Readmission Risk Interventions Readmission Risk Prevention Plan 08/29/2019 03/08/2019  Transportation Screening Complete Complete  PCP or Specialist Appt within 5-7 Days - Complete  PCP or Specialist Appt within 3-5 Days Complete -  Home Care Screening - Complete  Medication Review (RN CM) - Complete  HRI or Home Care Consult Complete -  Social Work Consult for Recovery Care Planning/Counseling Complete -  Palliative Care Screening Complete -  Medication Review Press photographer) Complete -  Some recent data might be hidden

## 2021-03-13 NOTE — Progress Notes (Signed)
When this patient came down for her MRI, her controller for her pain stimulator was dead. I attempted to plug it in to charge it so that I could place her stimulator in MRI mode. However, the controller would not turn on (it was completely black with no flashing lights or indication that it was charging). I called [employer] and they will be reaching out in the am to resolve the issue so that we can scan the patient.

## 2021-03-14 ENCOUNTER — Observation Stay (HOSPITAL_BASED_OUTPATIENT_CLINIC_OR_DEPARTMENT_OTHER): Payer: Medicare (Managed Care)

## 2021-03-14 ENCOUNTER — Observation Stay (HOSPITAL_COMMUNITY): Payer: Medicare (Managed Care)

## 2021-03-14 DIAGNOSIS — G459 Transient cerebral ischemic attack, unspecified: Principal | ICD-10-CM

## 2021-03-14 DIAGNOSIS — I10 Essential (primary) hypertension: Secondary | ICD-10-CM | POA: Diagnosis not present

## 2021-03-14 LAB — CBC WITH DIFFERENTIAL/PLATELET
Abs Immature Granulocytes: 0.03 10*3/uL (ref 0.00–0.07)
Basophils Absolute: 0 10*3/uL (ref 0.0–0.1)
Basophils Relative: 0 %
Eosinophils Absolute: 0.1 10*3/uL (ref 0.0–0.5)
Eosinophils Relative: 1 %
HCT: 31.5 % — ABNORMAL LOW (ref 36.0–46.0)
Hemoglobin: 10.7 g/dL — ABNORMAL LOW (ref 12.0–15.0)
Immature Granulocytes: 0 %
Lymphocytes Relative: 37 %
Lymphs Abs: 3.4 10*3/uL (ref 0.7–4.0)
MCH: 31 pg (ref 26.0–34.0)
MCHC: 34 g/dL (ref 30.0–36.0)
MCV: 91.3 fL (ref 80.0–100.0)
Monocytes Absolute: 0.7 10*3/uL (ref 0.1–1.0)
Monocytes Relative: 7 %
Neutro Abs: 5.1 10*3/uL (ref 1.7–7.7)
Neutrophils Relative %: 55 %
Platelets: 154 10*3/uL (ref 150–400)
RBC: 3.45 MIL/uL — ABNORMAL LOW (ref 3.87–5.11)
RDW: 15.9 % — ABNORMAL HIGH (ref 11.5–15.5)
WBC: 9.3 10*3/uL (ref 4.0–10.5)
nRBC: 0 % (ref 0.0–0.2)

## 2021-03-14 LAB — ECHOCARDIOGRAM COMPLETE BUBBLE STUDY
Area-P 1/2: 3.28 cm2
S' Lateral: 2.4 cm

## 2021-03-14 LAB — MAGNESIUM: Magnesium: 1.8 mg/dL (ref 1.7–2.4)

## 2021-03-14 LAB — BASIC METABOLIC PANEL
Anion gap: 5 (ref 5–15)
BUN: 17 mg/dL (ref 8–23)
CO2: 29 mmol/L (ref 22–32)
Calcium: 8 mg/dL — ABNORMAL LOW (ref 8.9–10.3)
Chloride: 108 mmol/L (ref 98–111)
Creatinine, Ser: 0.75 mg/dL (ref 0.44–1.00)
GFR, Estimated: >60 mL/min (ref >60)
Glucose, Bld: 70 mg/dL (ref 70–99)
Potassium: 3.9 mmol/L (ref 3.5–5.1)
Sodium: 142 mmol/L (ref 135–145)

## 2021-03-14 LAB — HEMOGLOBIN A1C
Hgb A1c MFr Bld: 4.3 % — ABNORMAL LOW (ref 4.8–5.6)
Mean Plasma Glucose: 77 mg/dL

## 2021-03-14 MED ORDER — LORAZEPAM 2 MG/ML IJ SOLN
2.0000 mg | Freq: Once | INTRAMUSCULAR | Status: AC
  Administered 2021-03-14: 2 mg via INTRAVENOUS
  Filled 2021-03-14: qty 1

## 2021-03-14 MED ORDER — VITAMIN B-12 1000 MCG PO TABS
2000.0000 ug | ORAL_TABLET | Freq: Every day | ORAL | Status: DC
  Administered 2021-03-14 – 2021-03-21 (×8): 2000 ug via ORAL
  Filled 2021-03-14 (×11): qty 2

## 2021-03-14 MED ORDER — PROSOURCE PLUS PO LIQD
30.0000 mL | Freq: Two times a day (BID) | ORAL | Status: DC
  Administered 2021-03-15 – 2021-03-22 (×12): 30 mL via ORAL
  Filled 2021-03-14 (×15): qty 30

## 2021-03-14 MED ORDER — HALOPERIDOL LACTATE 5 MG/ML IJ SOLN: 5.0000 mg | Freq: Four times a day (QID) | INTRAMUSCULAR | Status: DC | PRN

## 2021-03-14 MED ORDER — ENSURE MAX PROTEIN PO LIQD
11.0000 [oz_av] | Freq: Two times a day (BID) | ORAL | Status: DC
  Administered 2021-03-14 – 2021-03-18 (×7): 11 [oz_av] via ORAL
  Filled 2021-03-14 (×16): qty 330

## 2021-03-14 NOTE — TOC Progression Note (Signed)
Transition of Care [facility]) - Progression Note    Patient Details  Name: PAMMIE CHIRINO MRN: [id number] Date of Birth: 1961-07-22  Transition of Care [facility]) CM/SW Contact  Pollie Friar, RN Phone Number: 03/14/2021, 2:18 PM  Clinical Narrative:    [facility] has accepted for [facility] services. CM following.   Expected Discharge Plan: [place] Barriers to Discharge: Continued Medical Work up  Expected Discharge Plan and Services Expected Discharge Plan: [place]   Discharge Planning Services: CM Consult Post Acute Care Choice: [place] arrangements for the past 2 months: Single Family Home                           HH Arranged: PT, OT, Speech Therapy [place] Agency: [facility] ([facility])         Social Determinants of Health (SDOH) Interventions    Readmission Risk Interventions Readmission Risk Prevention Plan 08/29/2019 03/08/2019  Transportation Screening Complete Complete  PCP or Specialist Appt within 5-7 Days - Complete  PCP or Specialist Appt within 3-5 Days Complete -  Home Care Screening - Complete  Medication Review (RN CM) - Complete  HRI or Home Care Consult Complete -  Social Work Consult for Recovery Care Planning/Counseling Complete -  Palliative Care Screening Complete -  Medication Review Press photographer) Complete -  Some recent data might be hidden

## 2021-03-14 NOTE — Progress Notes (Signed)
Pt has MR Conditional SCS. Implant was turn off and put into MRI safe mode with husband present prior to scan. Pt brought down to MRI for exam and scan according to safety guidelines. Exam was completed and pt was taken out the scanner and assisted back into bed. Pt immediately became agitated. Technologist brought her back to her room immediately. Husband and RN were notified. Stimulator turned back on per pt consent after pt was back in bed.

## 2021-03-14 NOTE — Progress Notes (Addendum)
Vitamin B12 level is low by neurological standards. High dose PO supplementation has been ordered and should be continued indefinitely.   Regarding obtaining her MRI, her spinal cord stimulator is a [employer] and is MRI compatible, however company representative is needed to put into safety mode after recharging battery in order to perform MRI.   TTE reveals a very small hazy mobile density on the ventricular side of  the AV leaflets seen only in the parasternal long axis view. This may  represent artifact but in setting of CVA, need to consider mass. If  clinical suspicion high then recommend TEE.   Electronically signed: Dr. Kerney Elbe

## 2021-03-14 NOTE — Progress Notes (Addendum)
Progress Note    Jodi Bowman  LOV:[id number] DOB: 10-Mar-1961  DOA: 03/12/2021 PCP: Aletha Halim., PA-C      Brief Narrative:    Medical records reviewed and are as summarized below:  Jodi Bowman is a 60 y.o. female with medical history significant of fibromyalgia; s/p gastric bypass (2017); HLD; HTN; OSA, chronic back pain s/p spinal cord stimulator and CVA in 2000.  She was brought to the hospital because of confusion, hallucinations and a fall at home.  Her husband said he noticed a facial droop.  She is on opiate analgesics and she is being weaned off of opioids since placement of a spinal cord stimulator.     Assessment/Plan:   Principal Problem:   Acute metabolic encephalopathy Active Problems:   Essential hypertension   Chronic pain syndrome   Mood disorder (HCC)    Body mass index is 28.5 kg/m.  Probable TIA: CT head and MRI brain did not show any acute abnormality. Continue aspirin.  Follow-up with neurologist.  Monitor on telemetry.  Suspect small hazy mobile density on the ventricular side of the AV leaflets that may represent an artifact versus small mass.  Consult cardiology to evaluate for consideration of TEE.  I spoke to "cardiology master" who is coordinating cardiology consults.  He said he would arrange for a TEE for this patient.  Confusion, delirium, anxiety: Haldol as needed for agitation.  Ativan as needed for anxiety  Hypertension: BP is okay.  Hold on substances for now.  Hyperlipidemia: LDL 32, HDL 68, triglycerides 21, total cholesterol 104  Hypokalemia and hypomagnesemia: Improved  Chronic diastolic CHF: 2D echo showed EF estimated at 65 to 70%, mild concentric LVH, grade 1 diastolic dysfunction,  Chronic back pain: S/p spinal cord stimulator in place.  Analgesics as needed for pain.  OSA: Continue CPAP at night.  Mood disorder: Continue psychotropics  Acute urinary retention: Improved.  She required in and out  urethral catheterization on the morning of 03/13/2021.  I called her husband him an update but there was no response.  I left a voice message for him to call back.   Diet Order             Diet Heart Room service appropriate? Yes; Fluid consistency: Thin  Diet effective now                      Consultants: Neurologist  Procedures: None    Medications:     stroke: mapping our early stages of recovery book   Does not apply Once   enoxaparin (LOVENOX) injection  40 mg Subcutaneous Q24H   lidocaine  1 patch Transdermal Q24H   vitamin B-12  2,000 mcg Oral Daily   Continuous Infusions:  sodium chloride       Anti-infectives (From admission, onward)    None              Family Communication/Anticipated D/C date and plan/Code Status   DVT prophylaxis: enoxaparin (LOVENOX) injection 40 mg Start: 03/12/21 2200     Code Status: DNR  Family Communication: None Disposition Plan:    Status is: Observation  The patient will require care spanning > 2 midnights and should be moved to inpatient because: Inpatient level of care appropriate due to severity of illness  Dispo: The patient is from: Home              Anticipated d/c is to: Home  Patient currently is not medically stable to d/c.   Difficult to place patient No           Subjective:   Interval events noted.  She thinks weakness in the right side has improved especially in the right leg.  However, she feels the right upper extremity is still slightly weak.  Later in the day, patient became agitated and confused after she had her MRI.  Objective:    Vitals:   03/13/21 2311 03/14/21 0326 03/14/21 0728 03/14/21 1126  BP: 106/61 (!) 103/58 (!) 144/60 124/66  Pulse: 77 69 (!) 54 86  Resp: 19 18 16    Temp: 98 F (36.7 C) 97.9 F (36.6 C) 98 F (36.7 C) 98.3 F (36.8 C)  TempSrc:   Oral Oral  SpO2: 100% 100% 100% 100%  Weight:      Height:       No data  found.   Intake/Output Summary (Last 24 hours) at 03/14/2021 1457 Last data filed at 03/13/2021 1837 Gross per 24 hour  Intake --  Output 2 ml  Net -2 ml   Filed Weights   03/12/21 1411  Weight: 64 kg    Exam:  GEN: NAD SKIN: Warm and dry EYES: EOMI ENT: MMM CV: RRR PULM: CTA B ABD: soft, ND, NT, +BS CNS: AAO x 2 (person and place), non focal EXT: B/l leg edema (1+), no tenderness       Data Reviewed:   I have personally reviewed following labs and imaging studies:  Labs: Labs show the following:   Basic Metabolic Panel: Recent Labs  Lab 03/12/21 1331 03/12/21 1338 03/12/21 1932 03/13/21 0953 03/14/21 0215  NA 139 141  --  141 142  K 3.2* 3.2*  --  3.0* 3.9  CL 105 106  --  108 108  CO2 28  --   --  27 29  GLUCOSE 84 77  --  84 70  BUN 19 20  --  19 17  CREATININE 1.10* 1.10*  --  0.89 0.75  CALCIUM 8.4*  --   --  8.2* 8.0*  MG  --   --  1.6* 1.4* 1.8  PHOS  --   --  3.5 3.3  --    GFR Estimated Creatinine Clearance: 60 mL/min (by C-G formula based on SCr of 0.75 mg/dL). Liver Function Tests: Recent Labs  Lab 03/12/21 1331  AST 36  ALT 29  ALKPHOS 72  BILITOT 0.6  PROT 5.5*  ALBUMIN 2.5*   No results for input(s): LIPASE, AMYLASE in the last 168 hours. No results for input(s): AMMONIA in the last 168 hours. Coagulation profile Recent Labs  Lab 03/12/21 1331  INR 1.1    CBC: Recent Labs  Lab 03/12/21 1331 03/12/21 1338 03/14/21 0215  WBC 10.8*  --  9.3  NEUTROABS 6.6  --  5.1  HGB 11.9* 13.6 10.7*  HCT 37.5 40.0 31.5*  MCV 96.4  --  91.3  PLT 178  --  154   Cardiac Enzymes: No results for input(s): CKTOTAL, CKMB, CKMBINDEX, TROPONINI in the last 168 hours. BNP (last 3 results) No results for input(s): PROBNP in the last 8760 hours. CBG: Recent Labs  Lab 03/12/21 1324  GLUCAP 93   D-Dimer: No results for input(s): DDIMER in the last 72 hours. Hgb A1c: Recent Labs    03/12/21 1933  HGBA1C 4.3*   Lipid  Profile: Recent Labs    03/12/21 1933  CHOL 104  HDL 68  LDLCALC 32  TRIG 21  CHOLHDL 1.5   Thyroid function studies: No results for input(s): TSH, T4TOTAL, T3FREE, THYROIDAB in the last 72 hours.  Invalid input(s): FREET3 Anemia work up: Recent Labs    03/12/21 1932 03/12/21 1933  VITAMINB12 341  --   FOLATE  --  18.9   Sepsis Labs: Recent Labs  Lab 03/12/21 1331 03/14/21 0215  WBC 10.8* 9.3    Microbiology Recent Results (from the past 240 hour(s))  Resp Panel by RT-PCR (Flu A&B, Covid) Nasopharyngeal Swab     Status: None   Collection Time: 03/12/21  1:31 PM   Specimen: Nasopharyngeal Swab; Nasopharyngeal(NP) swabs in vial transport medium  Result Value Ref Range Status   SARS Coronavirus 2 by RT PCR NEGATIVE NEGATIVE Final    Comment: (NOTE) SARS-CoV-2 target nucleic acids are NOT DETECTED.  The SARS-CoV-2 RNA is generally detectable in upper respiratory specimens during the acute phase of infection. The lowest concentration of SARS-CoV-2 viral copies this assay can detect is 138 copies/mL. A negative result does not preclude SARS-Cov-2 infection and should not be used as the sole basis for treatment or other patient management decisions. A negative result may occur with  improper specimen collection/handling, submission of specimen other than nasopharyngeal swab, presence of viral mutation(s) within the areas targeted by this assay, and inadequate number of viral copies(<138 copies/mL). A negative result must be combined with clinical observations, patient history, and epidemiological information. The expected result is Negative.  Fact Sheet for Patients:  EntrepreneurPulse.com.au  Fact Sheet for Healthcare Providers:  IncredibleEmployment.be  This test is no t yet approved or cleared by the [place] FDA and  has been authorized for detection and/or diagnosis of SARS-CoV-2 by FDA under an Emergency Use  Authorization (EUA). This EUA will remain  in effect (meaning this test can be used) for the duration of the COVID-19 declaration under Section 564(b)(1) of the Act, 21 U.S.C.section 360bbb-3(b)(1), unless the authorization is terminated  or revoked sooner.       Influenza A by PCR NEGATIVE NEGATIVE Final   Influenza B by PCR NEGATIVE NEGATIVE Final    Comment: (NOTE) The Xpert Xpress SARS-CoV-2/FLU/RSV plus assay is intended as an aid in the diagnosis of influenza from Nasopharyngeal swab specimens and should not be used as a sole basis for treatment. Nasal washings and aspirates are unacceptable for Xpert Xpress SARS-CoV-2/FLU/RSV testing.  Fact Sheet for Patients: EntrepreneurPulse.com.au  Fact Sheet for Healthcare Providers: IncredibleEmployment.be  This test is not yet approved or cleared by the [place] FDA and has been authorized for detection and/or diagnosis of SARS-CoV-2 by FDA under an Emergency Use Authorization (EUA). This EUA will remain in effect (meaning this test can be used) for the duration of the COVID-19 declaration under Section 564(b)(1) of the Act, 21 U.S.C. section 360bbb-3(b)(1), unless the authorization is terminated or revoked.  Performed at [facility], [place] [address], [place], [place] [address]     Procedures and diagnostic studies:  MR BRAIN WO CONTRAST  Result Date: 03/14/2021 CLINICAL DATA:  Stroke, follow-up. Additional provided: Weakness, facial droop. EXAM: MRI HEAD WITHOUT CONTRAST TECHNIQUE: Multiplanar, multiecho pulse sequences of the brain and surrounding structures were obtained without intravenous contrast. COMPARISON:  Noncontrast head CT and CT angiogram head/neck 03/12/2021. Brain MRI 06/29/2020. FINDINGS: Brain: The examination is intermittently motion degraded, limiting evaluation. Most notably, there is moderate motion degradation of the sagittal T1 weighted sequence, moderate  motion degradation of the axial T2 FLAIR sequence,  severe motion degradation of the axial T1 weighted sequence, moderate/severe motion degradation of the axial T2 sequence and moderate/severe motion degradation of the coronal T2 sequence. Cerebral volume is normal for age. Mild multifocal T2/FLAIR hyperintensity within the cerebral white matter and pons, nonspecific but compatible with chronic small vessel ischemic disease. There is no acute infarct. No evidence of intracranial mass. No chronic intracranial blood products. No extra-axial fluid collection. No midline shift. Partially empty sella turcica. Vascular: Expected proximal arterial flow voids. Skull and upper cervical spine: No focal marrow lesion is identified. Sinuses/Orbits: Visualized orbits show no acute finding. No significant paranasal sinus disease at the imaged levels. IMPRESSION: Motion degraded examination, as described and limiting evaluation. The diffusion-weighted imaging is of good quality. No evidence of acute infarction. No acute intracranial abnormality is identified. Mild chronic small vessel ischemic changes within the cerebral white matter and pons, stable as compared to the brain MRI of 06/29/2020. Electronically Signed   By: Kellie Simmering DO   On: 03/14/2021 13:48   ECHOCARDIOGRAM COMPLETE BUBBLE STUDY  Result Date: 03/14/2021    ECHOCARDIOGRAM REPORT   Patient Name:   CHARMAN BLASCO [facility] Date of Exam: 03/14/2021 Medical Rec #:  [id number]        Height:       59.0 in Accession #:    [id number]       Weight:       141.1 lb Date of Birth:  1961/04/10       BSA:          1.590 m Patient Age:    4 years         BP:           144/60 mmHg Patient Gender: F                HR:           87 bpm. Exam Location:  Inpatient Procedure: 2D Echo, Cardiac Doppler, Color Doppler and Saline Contrast Bubble            Study Indications:    Stroke I63.9  History:        Patient has prior history of Echocardiogram examinations, most                 recent  08/26/2019. Risk Factors:Hypertension, Dyslipidemia,                 Sleep Apnea and GERD.  Sonographer:    Jonelle Sidle Dance Referring Phys: [id number] Jennye Boroughs  Sonographer Comments: No subcostal window. IMPRESSIONS  1. There is a false tendon in the LV apex of no clinical significance     . Left ventricular ejection fraction, by estimation, is 65 to 70%. The left ventricle has normal function. The left ventricle has no regional wall motion abnormalities. There is mild concentric left ventricular hypertrophy. Left ventricular diastolic  parameters are consistent with Grade I diastolic dysfunction (impaired relaxation).  2. Right ventricular systolic function is normal. The right ventricular size is normal. Tricuspid regurgitation signal is inadequate for assessing PA pressure.  3. The mitral valve is normal in structure. No evidence of mitral valve regurgitation. No evidence of mitral stenosis.  4. There is a very small hazy mobile density on the ventricular side of the AV leaflets seen only in the parasternal long axis view. This may represent artifact but in setting of CVA, need to consider mass. If clinical suspicion high then recommend TEE.  The aortic valve is calcified. There is  moderate calcification of the aortic valve. There is moderate thickening of the aortic valve. Aortic valve regurgitation is not visualized. Mild to moderate aortic valve sclerosis/calcification is present, without  any evidence of aortic stenosis. FINDINGS  Left Ventricle: There is a false tendon in the LV apex of no clinical significance. Left ventricular ejection fraction, by estimation, is 65 to 70%. The left ventricle has normal function. The left ventricle has no regional wall motion abnormalities. The left ventricular internal cavity size was normal in size. There is mild concentric left ventricular hypertrophy. Left ventricular diastolic parameters are consistent with Grade I diastolic dysfunction (impaired relaxation). Normal  left ventricular filling pressure. Right Ventricle: The right ventricular size is normal. No increase in right ventricular wall thickness. Right ventricular systolic function is normal. Tricuspid regurgitation signal is inadequate for assessing PA pressure. Left Atrium: Left atrial size was normal in size. Right Atrium: Right atrial size was normal in size. Prominent Chiari network. Pericardium: There is no evidence of pericardial effusion. Mitral Valve: The mitral valve is normal in structure. No evidence of mitral valve regurgitation. No evidence of mitral valve stenosis. Tricuspid Valve: The tricuspid valve is normal in structure. Tricuspid valve regurgitation is trivial. No evidence of tricuspid stenosis. Aortic Valve: There is a very small hazy mobile density on the ventricular side of the AV leaflets seen only in the parasternal long axis view. This may represent artifact but in setting of CVA, need to consider mass. If clinical suspicion high then recommend TEE. The aortic valve is calcified. There is moderate calcification of the aortic valve. There is moderate thickening of the aortic valve. Aortic valve regurgitation is not visualized. Mild to moderate aortic valve sclerosis/calcification is present, without any evidence of aortic stenosis. Pulmonic Valve: The pulmonic valve was normal in structure. Pulmonic valve regurgitation is not visualized. No evidence of pulmonic stenosis. Aorta: The aortic root is normal in size and structure. Venous: The inferior vena cava was not well visualized. IAS/Shunts: No atrial level shunt detected by color flow Doppler. Agitated saline contrast was given intravenously to evaluate for intracardiac shunting.  LEFT VENTRICLE PLAX 2D LVIDd:         3.60 cm  Diastology LVIDs:         2.40 cm  LV e' medial:    4.57 cm/s LV PW:         1.20 cm  LV E/e' medial:  12.2 LV IVS:        1.20 cm  LV e' lateral:   7.18 cm/s LVOT diam:     2.00 cm  LV E/e' lateral: 7.8 LV SV:         59  LV SV Index:   37 LVOT Area:     3.14 cm  RIGHT VENTRICLE RV Basal diam:  2.40 cm RV S prime:     10.70 cm/s TAPSE (M-mode): 1.3 cm LEFT ATRIUM             Index       RIGHT ATRIUM           Index LA diam:        3.10 cm 1.95 cm/m  RA Area:     10.60 cm LA Vol (A2C):   46.6 ml 29.30 ml/m RA Volume:   22.30 ml  14.02 ml/m LA Vol (A4C):   27.4 ml 17.23 ml/m LA Biplane Vol: 37.5 ml 23.58 ml/m  AORTIC VALVE LVOT Vmax:   93.30 cm/s LVOT Vmean:  73.200 cm/s LVOT  VTI:    0.189 m  AORTA Ao Root diam: 2.90 cm Ao Asc diam:  2.50 cm MITRAL VALVE MV Area (PHT): 3.28 cm    SHUNTS MV Decel Time: 231 msec    Systemic VTI:  0.19 m MV E velocity: 55.70 cm/s  Systemic Diam: 2.00 cm MV A velocity: 84.00 cm/s MV E/A ratio:  0.66 Fransico Him MD Electronically signed by Fransico Him MD Signature Date/Time: 03/14/2021/11:17:09 AM    Final                LOS: 0 days   Jessicah Croll  [facility]   Pager on [email or web address]. If 7PM-7AM, please contact night-coverage at [email or web address]     03/14/2021, 2:57 PM

## 2021-03-14 NOTE — Progress Notes (Signed)
Initial Nutrition Assessment  DOCUMENTATION CODES:  Not applicable  INTERVENTION:  Recommend liberalizing diet to regular to optimize PO intake Ensure Max po BID, each supplement provides 150 kcal and 30 grams of protein PROSource PLUS PO 44mls BID, each supplement provides 100 kcals and 15 grams of protein Recommend initiation of bariatric vitamin regimen Recommend checking the following levels given h/o gastric bypass: Thiamine B1 Vitamin A Vitamin C Copper Selenium Zinc Iron panel   NUTRITION DIAGNOSIS:  Inadequate oral intake related to poor appetite as evidenced by meal completion < 50%.  GOAL:  Patient will meet greater than or equal to 90% of their needs  MONITOR:  PO intake, Supplement acceptance, Weight trends, Labs, I & O's  REASON FOR ASSESSMENT:  Consult Assessment of nutrition requirement/status  ASSESSMENT:  Pt with a PMH significant for fibromyalgia, h/o gastric bypass (2017), HLD, HTN, OSA, chronic back pain s/p spinal cord stimulator, and h/o CVA (2000) presented s/p fall with acute metabolic encephalopathy and hallucinations. Note pt is on opiate analgesics and is being weaned off of opiods since placement of spinal cord stimulator.   Pt unavailable at time of RD visit; out of room for MRI. Discussed pt with RN who reports pt is doing poorly with meals and does not like available food options. RD will recommend diet liberalization and will order oral nutrition supplements. Will also request the following labs be checked given pt's h/o bariatric surgery: Thiamine B1, Vitamin A, Vitamin C, Copper, Selenium, Zinc, Iron panel. Pt should be on bariatric vitamin regimen. Note pt was initiated on vitamin B12 since admit due to levels being below normal limits "by neurological standards" per MD. Neuro workup otherwise still pending.   Weight history reviewed. No significant weight changes noted.   No PO Intake documented   UOP: 857ml x24 hours Stool: 1x unmeasured  occurrence x24 hours Emesis: 1x unmeasured occurrence x24 hours I/O: -853ml since admit  Medications: Scheduled Meds:   stroke: mapping our early stages of recovery book   Does not apply Once   [START ON 03/15/2021] (feeding supplement) PROSource Plus  30 mL Oral BID BM   enoxaparin (LOVENOX) injection  40 mg Subcutaneous Q24H   lidocaine  1 patch Transdermal Q24H   Ensure Max Protein  11 oz Oral BID   vitamin B-12  2,000 mcg Oral Daily  Continuous Infusions:  sodium chloride     Labs: Recent Labs  Lab 03/12/21 1331 03/12/21 1338 03/12/21 1932 03/13/21 0953 03/14/21 0215  NA 139 141  --  141 142  K 3.2* 3.2*  --  3.0* 3.9  CL 105 106  --  108 108  CO2 28  --   --  27 29  BUN 19 20  --  19 17  CREATININE 1.10* 1.10*  --  0.89 0.75  CALCIUM 8.4*  --   --  8.2* 8.0*  MG  --   --  1.6* 1.4* 1.8  PHOS  --   --  3.5 3.3  --   GLUCOSE 84 77  --  84 70  Vitamin D: 66.98 (WNL) Vitamin B12: 341 (WNL) Folate: 18.9 WNL  Diet Order:   Diet Order             Diet Heart Room service appropriate? Yes; Fluid consistency: Thin  Diet effective now                  EDUCATION NEEDS:  No education needs have been identified at this time  Skin:  Skin Assessment: Reviewed RN Assessment  Last BM:  6/14  Height:  Ht Readings from Last 1 Encounters:  03/12/21 4\' 11"  (1.499 m)   Weight:  Wt Readings from Last 1 Encounters:  03/12/21 64 kg   BMI:  Body mass index is 28.5 kg/m.  Estimated Nutritional Needs:  Kcal:  1600-1800 Protein:  80-90g Fluid:  >1.6L/d    Larkin Ina, MS, RD, LDN Pronouns: She/Her/Hers RD pager number and weekend/on-call pager number located in [place].

## 2021-03-14 NOTE — Progress Notes (Signed)
SLP Cancellation Note  Patient Details Name: Jodi Bowman MRN: [id number] DOB: 10/31/1960   Cancelled treatment:       Reason Eval/Treat Not Completed: Patient at procedure or test/unavailable (currently off the floor for MRI). Will continue efforts.   [place], CCC-SLP Acute Rehabilitation Services   03/14/2021, 1:44 PM

## 2021-03-14 NOTE — Progress Notes (Signed)
  Echocardiogram 2D Echocardiogram has been performed.  Abdulkarim Eberlin G Tama Grosz 03/14/2021, 11:05 AM

## 2021-03-15 DIAGNOSIS — G459 Transient cerebral ischemic attack, unspecified: Secondary | ICD-10-CM | POA: Diagnosis not present

## 2021-03-15 DIAGNOSIS — I10 Essential (primary) hypertension: Secondary | ICD-10-CM | POA: Diagnosis not present

## 2021-03-15 MED ORDER — FLUTICASONE PROPIONATE 50 MCG/ACT NA SUSP
1.0000 | Freq: Every day | NASAL | Status: DC | PRN
  Filled 2021-03-15: qty 16

## 2021-03-15 MED ORDER — LOSARTAN POTASSIUM 50 MG PO TABS
100.0000 mg | ORAL_TABLET | Freq: Every day | ORAL | Status: DC
  Administered 2021-03-15 – 2021-03-16 (×2): 100 mg via ORAL
  Filled 2021-03-15 (×4): qty 2

## 2021-03-15 MED ORDER — ENSURE ENLIVE PO LIQD
237.0000 mL | Freq: Two times a day (BID) | ORAL | Status: DC
  Administered 2021-03-16 – 2021-03-21 (×9): 237 mL via ORAL
  Filled 2021-03-15 (×2): qty 237

## 2021-03-15 MED ORDER — PREGABALIN 75 MG PO CAPS
75.0000 mg | ORAL_CAPSULE | Freq: Three times a day (TID) | ORAL | Status: DC
  Administered 2021-03-15 – 2021-03-22 (×21): 75 mg via ORAL
  Filled 2021-03-15 (×22): qty 1

## 2021-03-15 MED ORDER — ASPIRIN EC 81 MG PO TBEC
81.0000 mg | DELAYED_RELEASE_TABLET | Freq: Every day | ORAL | Status: DC
  Administered 2021-03-15 – 2021-03-22 (×8): 81 mg via ORAL
  Filled 2021-03-15 (×9): qty 1

## 2021-03-15 MED ORDER — HYDROCHLOROTHIAZIDE 12.5 MG PO CAPS
12.5000 mg | ORAL_CAPSULE | Freq: Every day | ORAL | Status: DC
  Administered 2021-03-15 – 2021-03-16 (×2): 12.5 mg via ORAL
  Filled 2021-03-15 (×2): qty 1

## 2021-03-15 MED ORDER — DULOXETINE HCL 60 MG PO CPEP
60.0000 mg | ORAL_CAPSULE | Freq: Every day | ORAL | Status: DC
  Administered 2021-03-15 – 2021-03-22 (×8): 60 mg via ORAL
  Filled 2021-03-15 (×9): qty 1

## 2021-03-15 MED ORDER — MIRTAZAPINE 15 MG PO TABS
7.5000 mg | ORAL_TABLET | Freq: Every day | ORAL | Status: DC
  Administered 2021-03-15 – 2021-03-21 (×7): 7.5 mg via ORAL
  Filled 2021-03-15 (×7): qty 1

## 2021-03-15 MED ORDER — PANTOPRAZOLE SODIUM 40 MG PO TBEC
40.0000 mg | DELAYED_RELEASE_TABLET | Freq: Two times a day (BID) | ORAL | Status: DC
  Administered 2021-03-15 – 2021-03-22 (×14): 40 mg via ORAL
  Filled 2021-03-15 (×15): qty 1

## 2021-03-15 MED ORDER — AMLODIPINE BESYLATE 5 MG PO TABS: 5.0000 mg | ORAL_TABLET | Freq: Every day | ORAL | Status: DC

## 2021-03-15 MED ORDER — ISOSORBIDE MONONITRATE ER 30 MG PO TB24
30.0000 mg | ORAL_TABLET | Freq: Every day | ORAL | Status: DC
  Administered 2021-03-15 – 2021-03-22 (×8): 30 mg via ORAL
  Filled 2021-03-15 (×8): qty 1

## 2021-03-15 MED ORDER — HYDROCODONE-ACETAMINOPHEN 10-325 MG PO TABS
1.0000 | ORAL_TABLET | Freq: Two times a day (BID) | ORAL | Status: DC | PRN
  Administered 2021-03-15 – 2021-03-20 (×5): 1 via ORAL
  Filled 2021-03-15 (×5): qty 1

## 2021-03-15 MED ORDER — ESCITALOPRAM OXALATE 10 MG PO TABS
10.0000 mg | ORAL_TABLET | Freq: Every day | ORAL | Status: DC
  Administered 2021-03-15 – 2021-03-21 (×7): 10 mg via ORAL
  Filled 2021-03-15 (×7): qty 1

## 2021-03-15 MED ORDER — DICLOFENAC SODIUM 1 % EX GEL
4.0000 g | Freq: Two times a day (BID) | CUTANEOUS | Status: DC | PRN
  Administered 2021-03-16 – 2021-03-17 (×3): 4 g via TOPICAL
  Filled 2021-03-15 (×2): qty 100

## 2021-03-15 MED ORDER — SPIRONOLACTONE 25 MG PO TABS
25.0000 mg | ORAL_TABLET | Freq: Every day | ORAL | Status: DC
  Administered 2021-03-15 – 2021-03-22 (×8): 25 mg via ORAL
  Filled 2021-03-15 (×9): qty 1

## 2021-03-15 MED ORDER — LOSARTAN POTASSIUM-HCTZ 100-12.5 MG PO TABS: 1.0000 | ORAL_TABLET | Freq: Every day | ORAL | Status: DC

## 2021-03-15 MED ORDER — LORATADINE 10 MG PO TABS
10.0000 mg | ORAL_TABLET | Freq: Every day | ORAL | Status: DC
  Administered 2021-03-16 – 2021-03-22 (×7): 10 mg via ORAL
  Filled 2021-03-15 (×8): qty 1

## 2021-03-15 MED ORDER — METOPROLOL SUCCINATE ER 100 MG PO TB24
100.0000 mg | ORAL_TABLET | Freq: Every day | ORAL | Status: DC
  Administered 2021-03-15 – 2021-03-16 (×2): 100 mg via ORAL
  Filled 2021-03-15 (×2): qty 1

## 2021-03-15 MED ORDER — ALBUTEROL SULFATE (2.5 MG/3ML) 0.083% IN NEBU: 2.5000 mg | INHALATION_SOLUTION | Freq: Four times a day (QID) | RESPIRATORY_TRACT | Status: DC | PRN

## 2021-03-15 MED ORDER — MORPHINE SULFATE ER 15 MG PO TBCR
30.0000 mg | EXTENDED_RELEASE_TABLET | Freq: Two times a day (BID) | ORAL | Status: DC
  Administered 2021-03-15 – 2021-03-22 (×14): 30 mg via ORAL
  Filled 2021-03-15 (×15): qty 2
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
## 2021-03-15 NOTE — Progress Notes (Addendum)
MRI brain: - No evidence of acute infarction on DWI.  - No acute intracranial abnormality is identified. - Mild chronic small vessel ischemic changes within the cerebral white matter and pons, stable as compared to the brain MRI of 06/29/2020.  Will need to consider obtaining a TEE to further assess the small hazy mobile density on the ventricular side of the AV leaflets seen only in the parasternal long axis view of the TTE. May need to curbside Cardiology for further recommendations on whether or not to obtain TEE.   Will need Nutrition consult for recommendations on chronic vitamin and mineral supplementation given history of gastric bypass in 2017. She is not on any vitamin supplementation at home.   Electronically signed: Dr. Kerney Elbe

## 2021-03-15 NOTE — Progress Notes (Signed)
  Speech Language Pathology Treatment: Cognitive-Linquistic  Patient Details Name: Jodi Bowman MRN: [id number] DOB: 1961/02/28 Today's Date: 03/15/2021 Time: 9987-2158 SLP Time Calculation (min) (ACUTE ONLY): 16.28 min  Assessment / Plan / Recommendation Clinical Impression  Pt was seen for cognitive-linguistic treatment. She was alert and cooperative during the session. She was oriented x4 and reported that she feels much better than during the initial evaluation. Pt demonstrated 100% accuracy with an executive function medication management task and with time management activities. She completed a mental manipulation sequencing task with 100% accuracy. Pt was able to maintain attention throughout the session even despite interruptions from phone calls and auditory distractions. Pt was also able to accurately recall various events from the day. Pt demonstrated adequate problem solving while speaking with her daughter. Pt reported that she believes that her cognition is very close to or at baseline. These reports were corroborated by her husband who was present via video and pt's sister reported to OT that the pt is back to baseline. Skilled acute SLP services will be discontinued at this time; however, pt was advised to follow up with her PCP if she notices increased difficulty with cognitive-linguistic tasks after discharge. Pt and family verbalized agreement with POC.   HPI HPI: Pt is a 60 y/o female who presented on 03/12/2021 with weakness and facial droop after sustaining a fall. CT head was negative. MRI brain negative for acute changes. TIA suspected by MD.  PMH significant for fibromyalgia, s/p gastric bypass (2017), HLD, HTN, OSA, and CVA in 2000.      SLP Plan  All goals met;Discharge SLP treatment due to (comment)       Recommendations                   Follow up Recommendations: None SLP Visit Diagnosis: Cognitive communication deficit (R41.841) Plan: All goals  met;Discharge SLP treatment due to (comment)       Byanka Landrus I. Hardin Negus, [place], [place] Office number [phone] Pager [phone]                Jodi Bowman 03/15/2021, 5:47 PM

## 2021-03-15 NOTE — Discharge Instructions (Signed)
Bariatric Surgery Vitamin and Mineral Supplements You will have vitamin and mineral deficiencies after bariatric surgery because your body isn't absorbing nutrients as well and you are eating smaller amounts. You will need to take vitamin and mineral supplements and follow-up with your bariatric team for supplement recommendations.  No matter how you are feeling, your health care provider must check your vitamin and mineral levels once a year to make sure you are getting enough of both.  Tips: Following is guidance on the types of vitamins and minerals you will need to take for the rest of your life. Multivitamin with minerals, including iron Take a chewable multivitamin with iron twice daily. Multivitamins should be taken at separate times throughout the day to improve absorption. Avoid gummy type vitamins--they do not have all the nutrients your body needs. Since you will be taking 2 multivitamins a day, you will need a supply of at least 60 chewable tablets for the first month after surgery. Vitamin B12 Take 350 to 500 micrograms (mcg) of vitamin B12 every day. You can take B12 as an oral, sublingual (under your tongue), or nasal supplement. You may also have the option to receive a monthly shot of 1,000 micrograms of B12 from your health care provider. Do not buy time-release B12. You can take a full daily dose of B12 at once. You do not need to divide the doses throughout the day. Vitamin D-3 The recommended dose of vitamin D is 3,000 IU a day in liquid or gel cap form. You can take a full daily dose of vitamin D-3 at once. You do not need to divide the doses throughout the day. Be sure to count the amount of vitamin D you are getting from your multivitamin, which can contain between [phone] IU (12.56mcg to 53mcg) of vitamin D. Calcium You can take a supplement that has calcium and vitamin D, but be sure to count the amount of Vitamin D in the combined supplement. Different amounts of  calcium are needed based on the type of surgery you received: Laparoscopic adjustable gastric banding, sleeve gastrectomy, Roux-en-Y gastric bypass: 1,200 milligrams (mg) per day. Biliopancreatic diversion, duodenal switch: 1,800 milligrams (mg) per day Calcium is best absorbed when it's taken in smaller doses. A smaller dose is less than 600 milligrams at one time. You will need to take more than 1,000 mg of calcium daily, so divide your doses into two or more throughout the day. Some calcium supplements need to be taken with food. Always read the label for dosing instructions.  Sample Vitamin and Mineral Supplement Schedule: Breakfast -- Multivitamin + Sublingual (under the tongue) B12 Snack -- Vitamin D + Calcium Lunch -- None Snack -- Vitamin D + Calcium Dinner -- Multivitamin  Copyright Academy of Nutrition and Dietetics       Vitamin and Mineral Recommendations  Before Surgery:  Multivitamin Complete  Vitamin D 5000IU After Surgery: Chewable Multivitamins:  Bariatric Advantage Advanced Multi EA Celebrate MultiComplete with 36 or 45 mg Iron  Opurity: Bypass and Sleeve Optimized Chewable  [employer]: Bariatric Multivitamin with 45mg  Iron Chewable       3 Chewable Calcium (500 mg each, total 1200-1500 mg per day)   *Any Calcium brand will do  Other Options:  4 Celebrate Essential Multi 2 in 1 (has calcium) + 18-60 mg separate iron  BariActiv Multi Vitamin + BariActiv Iron (When ordered it all comes together)   Vitamins and Calcium are available at:   [facility]  [address], [place], [place] [address]                                                www.bariatricadvantage.com                                                              www.celebratevitamins.com  www.procarenow.com      www.amazon.com  https://www.unjury.com/opurity-bypass-sleeve-optimized-chewable-new-formulation.html                                                                         Why bariatric specific vitamins?  The latest research from the [employer] of Metabolic and Bariatric Surgery (ASMBS) recommends that patients take at least 3000 IU of Vitamin D3, 12 mg of thiamin, 18-60 mg of iron, and 350-500 mcg of Vitamin B12 to prevent these common deficiencies after surgery. At this time no other complete multivitamins meet these criteria on their own.  Please talk with your dietitian to learn about other options if these options do not work for you.

## 2021-03-15 NOTE — Plan of Care (Signed)

## 2021-03-15 NOTE — Progress Notes (Signed)
PT Cancellation Note  Patient Details Name: Jodi Bowman MRN: [id number] DOB: Mar 06, 1961   Cancelled Treatment:    Reason Eval/Treat Not Completed: Other (comment).  OT just worked with pt including gait into the hallway.  He (OT) reports that she is back to her cognitive baseline per her husband and is having some increased back pain this afternoon and may need a break between our sessions.  PT to try back later as time allows or follow up on Monday 03/18/21 if pt still here.   Thanks,  Verdene Lennert, PT, DPT  Acute Rehabilitation Ortho Tech Supervisor [phone] pager #[phone] office      Jodi Bowman 03/15/2021, 3:08 PM
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
## 2021-03-15 NOTE — Progress Notes (Signed)
Nutrition Brief Note  RD received consult to review vitamin regimen s/p bariatric surgery. Pt underwent surgery in 2017. RD assessed pt yesterday and requested the following labs be checked due to the pt's h/o gastric bypass: Thiamine B1, Vitamin A, Vitamin C, Copper, Selenium, Zinc, Iron panel. Please note results are still pending and pt's specific vitamin/mineral regimen will be based on the results of those labs as any deficiencies will likely need to be corrected via additional supplementation. However, placed post-bariatric surgery vitamin/mineral education from both the Academy of Nutrition and Dietetics and from [facility] in the patient's discharge summary so that the correct products can be purchased.   Larkin Ina, MS, RD, LDN Pronouns: She/Her/Hers RD pager number and weekend/on-call pager number located in [place].

## 2021-03-15 NOTE — Progress Notes (Addendum)
Progress Note    Jodi Bowman  WIO:[id number] DOB: April 29, 1961  DOA: 03/12/2021 PCP: Aletha Halim., PA-C      Brief Narrative:    Medical records reviewed and are as summarized below:  Jodi Bowman is a 60 y.o. female with medical history significant of fibromyalgia; s/p gastric bypass (2017); HLD; HTN; OSA, chronic back pain s/p spinal cord stimulator and CVA in 2000.  She was brought to the hospital because of confusion, hallucinations and a fall at home.  Her husband said he noticed a facial droop.  She is on opiate analgesics and she is being weaned off of opioids since placement of a spinal cord stimulator.  She was admitted to the hospital because of concerns for acute stroke.  However, MRI brain did not show any evidence of acute stroke.  It is suspected that patient might have had a TIA.   Assessment/Plan:   Principal Problem:   Acute metabolic encephalopathy Active Problems:   Essential hypertension   Chronic pain syndrome   Mood disorder (HCC)    Body mass index is 28.5 kg/m.  Probable TIA: Resume home aspirin.  CT head and MRI brain did not show any acute abnormality. Continue aspirin.  Follow-up with neurologist.  Monitor on telemetry.  Suspect small hazy mobile density on the ventricular side of the AV leaflets that may represent an artifact versus small mass.  I spoke to Dr. Harrell Gave, cardiologist, today about this case.  She recommended TEE.  She said the earliest this can be done will be on Monday, 03/18/2021.  Confusion, delirium, anxiety: Improved.  Haldol as needed for agitation.  Ativan as needed for anxiety  Hypertension: Resume home antihypertensives.  Hyperlipidemia: LDL 32, HDL 68, triglycerides 21, total cholesterol 104  Hypokalemia and hypomagnesemia: Improved  Chronic diastolic CHF: 2D echo showed EF estimated at 65 to 70%, mild concentric LVH, grade 1 diastolic dysfunction,  Chronic back pain: S/p spinal cord stimulator in  place.  Analgesics as needed for pain.  OSA: Continue CPAP at night.  Mood disorder: Continue psychotropics  Acute urinary retention: Improved.  She required in and out urethral catheterization on the morning of 03/13/2021.  Plan of care was discussed with her husband over the phone.   Diet Order             Diet Heart Room service appropriate? Yes; Fluid consistency: Thin  Diet effective now                      Consultants: Neurologist  Procedures: None    Medications:     stroke: mapping our early stages of recovery book   Does not apply Once   (feeding supplement) PROSource Plus  30 mL Oral BID BM   enoxaparin (LOVENOX) injection  40 mg Subcutaneous Q24H   lidocaine  1 patch Transdermal Q24H   Ensure Max Protein  11 oz Oral BID   vitamin B-12  2,000 mcg Oral Daily   Continuous Infusions:  sodium chloride       Anti-infectives (From admission, onward)    None              Family Communication/Anticipated D/C date and plan/Code Status   DVT prophylaxis: enoxaparin (LOVENOX) injection 40 mg Start: 03/12/21 2200     Code Status: DNR  Family Communication: Her husband Disposition Plan:    Status is: Observation  The patient will require care spanning > 2 midnights and should be  moved to inpatient because: Inpatient level of care appropriate due to severity of illness  Dispo: The patient is from: Home              Anticipated d/c is to: Home              Patient currently is not medically stable to d/c.   Difficult to place patient No           Subjective:   Interval events noted.  Weakness in the right side has improved especially the right leg.  No new complaints.  Objective:    Vitals:   03/14/21 1615 03/14/21 2018 03/14/21 2346 03/15/21 0333  BP: 104/87 (!) 151/92 (!) 149/72 (!) 144/88  Pulse: 74 88 69 72  Resp: 18  18 17   Temp: 98 F (36.7 C) 97.9 F (36.6 C) 97.9 F (36.6 C) 97.6 F (36.4 C)  TempSrc: Oral  Oral  Oral  SpO2: 100% 100% 94% 100%  Weight:      Height:       No data found.   Intake/Output Summary (Last 24 hours) at 03/15/2021 0736 Last data filed at 03/14/2021 0840 Gross per 24 hour  Intake 120 ml  Output --  Net 120 ml   Filed Weights   03/12/21 1411  Weight: 64 kg    Exam:  GEN: NAD SKIN: Warm and dry EYES: EOMI ENT: MMM CV: RRR PULM: CTA B ABD: soft, ND, NT, +BS CNS: AAO x 2 (person and place), non focal EXT: B/l leg edema (1+), no tenderness       Data Reviewed:   I have personally reviewed following labs and imaging studies:  Labs: Labs show the following:   Basic Metabolic Panel: Recent Labs  Lab 03/12/21 1331 03/12/21 1338 03/12/21 1932 03/13/21 0953 03/14/21 0215  NA 139 141  --  141 142  K 3.2* 3.2*  --  3.0* 3.9  CL 105 106  --  108 108  CO2 28  --   --  27 29  GLUCOSE 84 77  --  84 70  BUN 19 20  --  19 17  CREATININE 1.10* 1.10*  --  0.89 0.75  CALCIUM 8.4*  --   --  8.2* 8.0*  MG  --   --  1.6* 1.4* 1.8  PHOS  --   --  3.5 3.3  --    GFR Estimated Creatinine Clearance: 60 mL/min (by C-G formula based on SCr of 0.75 mg/dL). Liver Function Tests: Recent Labs  Lab 03/12/21 1331  AST 36  ALT 29  ALKPHOS 72  BILITOT 0.6  PROT 5.5*  ALBUMIN 2.5*   No results for input(s): LIPASE, AMYLASE in the last 168 hours. No results for input(s): AMMONIA in the last 168 hours. Coagulation profile Recent Labs  Lab 03/12/21 1331  INR 1.1    CBC: Recent Labs  Lab 03/12/21 1331 03/12/21 1338 03/14/21 0215  WBC 10.8*  --  9.3  NEUTROABS 6.6  --  5.1  HGB 11.9* 13.6 10.7*  HCT 37.5 40.0 31.5*  MCV 96.4  --  91.3  PLT 178  --  154   Cardiac Enzymes: No results for input(s): CKTOTAL, CKMB, CKMBINDEX, TROPONINI in the last 168 hours. BNP (last 3 results) No results for input(s): PROBNP in the last 8760 hours. CBG: Recent Labs  Lab 03/12/21 1324  GLUCAP 93   D-Dimer: No results for input(s): DDIMER in the last 72  hours. Hgb A1c: Recent  Labs    03/12/21 1933  HGBA1C 4.3*   Lipid Profile: Recent Labs    03/12/21 1933  CHOL 104  HDL 68  LDLCALC 32  TRIG 21  CHOLHDL 1.5   Thyroid function studies: No results for input(s): TSH, T4TOTAL, T3FREE, THYROIDAB in the last 72 hours.  Invalid input(s): FREET3 Anemia work up: Recent Labs    03/12/21 1932 03/12/21 1933  VITAMINB12 341  --   FOLATE  --  18.9   Sepsis Labs: Recent Labs  Lab 03/12/21 1331 03/14/21 0215  WBC 10.8* 9.3    Microbiology Recent Results (from the past 240 hour(s))  Resp Panel by RT-PCR (Flu A&B, Covid) Nasopharyngeal Swab     Status: None   Collection Time: 03/12/21  1:31 PM   Specimen: Nasopharyngeal Swab; Nasopharyngeal(NP) swabs in vial transport medium  Result Value Ref Range Status   SARS Coronavirus 2 by RT PCR NEGATIVE NEGATIVE Final    Comment: (NOTE) SARS-CoV-2 target nucleic acids are NOT DETECTED.  The SARS-CoV-2 RNA is generally detectable in upper respiratory specimens during the acute phase of infection. The lowest concentration of SARS-CoV-2 viral copies this assay can detect is 138 copies/mL. A negative result does not preclude SARS-Cov-2 infection and should not be used as the sole basis for treatment or other patient management decisions. A negative result may occur with  improper specimen collection/handling, submission of specimen other than nasopharyngeal swab, presence of viral mutation(s) within the areas targeted by this assay, and inadequate number of viral copies(<138 copies/mL). A negative result must be combined with clinical observations, patient history, and epidemiological information. The expected result is Negative.  Fact Sheet for Patients:  EntrepreneurPulse.com.au  Fact Sheet for Healthcare Providers:  IncredibleEmployment.be  This test is no t yet approved or cleared by the [place] FDA and  has been authorized for  detection and/or diagnosis of SARS-CoV-2 by FDA under an Emergency Use Authorization (EUA). This EUA will remain  in effect (meaning this test can be used) for the duration of the COVID-19 declaration under Section 564(b)(1) of the Act, 21 U.S.C.section 360bbb-3(b)(1), unless the authorization is terminated  or revoked sooner.       Influenza A by PCR NEGATIVE NEGATIVE Final   Influenza B by PCR NEGATIVE NEGATIVE Final    Comment: (NOTE) The Xpert Xpress SARS-CoV-2/FLU/RSV plus assay is intended as an aid in the diagnosis of influenza from Nasopharyngeal swab specimens and should not be used as a sole basis for treatment. Nasal washings and aspirates are unacceptable for Xpert Xpress SARS-CoV-2/FLU/RSV testing.  Fact Sheet for Patients: EntrepreneurPulse.com.au  Fact Sheet for Healthcare Providers: IncredibleEmployment.be  This test is not yet approved or cleared by the [place] FDA and has been authorized for detection and/or diagnosis of SARS-CoV-2 by FDA under an Emergency Use Authorization (EUA). This EUA will remain in effect (meaning this test can be used) for the duration of the COVID-19 declaration under Section 564(b)(1) of the Act, 21 U.S.C. section 360bbb-3(b)(1), unless the authorization is terminated or revoked.  Performed at [facility], [place] [address] [place], [place] [address]     Procedures and diagnostic studies:  MR BRAIN WO CONTRAST  Result Date: 03/14/2021 CLINICAL DATA:  Stroke, follow-up. Additional provided: Weakness, facial droop. EXAM: MRI HEAD WITHOUT CONTRAST TECHNIQUE: Multiplanar, multiecho pulse sequences of the brain and surrounding structures were obtained without intravenous contrast. COMPARISON:  Noncontrast head CT and CT angiogram head/neck 03/12/2021. Brain MRI 06/29/2020. FINDINGS: Brain: The examination is intermittently  motion degraded, limiting evaluation. Most notably, there is moderate  motion degradation of the sagittal T1 weighted sequence, moderate motion degradation of the axial T2 FLAIR sequence, severe motion degradation of the axial T1 weighted sequence, moderate/severe motion degradation of the axial T2 sequence and moderate/severe motion degradation of the coronal T2 sequence. Cerebral volume is normal for age. Mild multifocal T2/FLAIR hyperintensity within the cerebral white matter and pons, nonspecific but compatible with chronic small vessel ischemic disease. There is no acute infarct. No evidence of intracranial mass. No chronic intracranial blood products. No extra-axial fluid collection. No midline shift. Partially empty sella turcica. Vascular: Expected proximal arterial flow voids. Skull and upper cervical spine: No focal marrow lesion is identified. Sinuses/Orbits: Visualized orbits show no acute finding. No significant paranasal sinus disease at the imaged levels. IMPRESSION: Motion degraded examination, as described and limiting evaluation. The diffusion-weighted imaging is of good quality. No evidence of acute infarction. No acute intracranial abnormality is identified. Mild chronic small vessel ischemic changes within the cerebral white matter and pons, stable as compared to the brain MRI of 06/29/2020. Electronically Signed   By: Kellie Simmering DO   On: 03/14/2021 13:48   ECHOCARDIOGRAM COMPLETE BUBBLE STUDY  Result Date: 03/14/2021    ECHOCARDIOGRAM REPORT   Patient Name:   Jodi Bowman [facility] Date of Exam: 03/14/2021 Medical Rec #:  [id number]        Height:       59.0 in Accession #:    [id number]       Weight:       141.1 lb Date of Birth:  [birth year]       BSA:          1.590 m Patient Age:    52 years         BP:           144/60 mmHg Patient Gender: F                HR:           87 bpm. Exam Location:  Inpatient Procedure: 2D Echo, Cardiac Doppler, Color Doppler and Saline Contrast Bubble            Study Indications:    Stroke I63.9  History:        Patient has prior  history of Echocardiogram examinations, most                 recent 08/26/2019. Risk Factors:Hypertension, Dyslipidemia,                 Sleep Apnea and GERD.  Sonographer:    Jonelle Sidle Dance Referring Phys: [id number] Jennye Boroughs  Sonographer Comments: No subcostal window. IMPRESSIONS  1. There is a false tendon in the LV apex of no clinical significance     . Left ventricular ejection fraction, by estimation, is 65 to 70%. The left ventricle has normal function. The left ventricle has no regional wall motion abnormalities. There is mild concentric left ventricular hypertrophy. Left ventricular diastolic  parameters are consistent with Grade I diastolic dysfunction (impaired relaxation).  2. Right ventricular systolic function is normal. The right ventricular size is normal. Tricuspid regurgitation signal is inadequate for assessing PA pressure.  3. The mitral valve is normal in structure. No evidence of mitral valve regurgitation. No evidence of mitral stenosis.  4. There is a very small hazy mobile density on the ventricular side of the AV leaflets seen only in the parasternal long axis view. This  may represent artifact but in setting of CVA, need to consider mass. If clinical suspicion high then recommend TEE.  The aortic valve is calcified. There is moderate calcification of the aortic valve. There is moderate thickening of the aortic valve. Aortic valve regurgitation is not visualized. Mild to moderate aortic valve sclerosis/calcification is present, without  any evidence of aortic stenosis. FINDINGS  Left Ventricle: There is a false tendon in the LV apex of no clinical significance. Left ventricular ejection fraction, by estimation, is 65 to 70%. The left ventricle has normal function. The left ventricle has no regional wall motion abnormalities. The left ventricular internal cavity size was normal in size. There is mild concentric left ventricular hypertrophy. Left ventricular diastolic parameters are consistent  with Grade I diastolic dysfunction (impaired relaxation). Normal left ventricular filling pressure. Right Ventricle: The right ventricular size is normal. No increase in right ventricular wall thickness. Right ventricular systolic function is normal. Tricuspid regurgitation signal is inadequate for assessing PA pressure. Left Atrium: Left atrial size was normal in size. Right Atrium: Right atrial size was normal in size. Prominent Chiari network. Pericardium: There is no evidence of pericardial effusion. Mitral Valve: The mitral valve is normal in structure. No evidence of mitral valve regurgitation. No evidence of mitral valve stenosis. Tricuspid Valve: The tricuspid valve is normal in structure. Tricuspid valve regurgitation is trivial. No evidence of tricuspid stenosis. Aortic Valve: There is a very small hazy mobile density on the ventricular side of the AV leaflets seen only in the parasternal long axis view. This may represent artifact but in setting of CVA, need to consider mass. If clinical suspicion high then recommend TEE. The aortic valve is calcified. There is moderate calcification of the aortic valve. There is moderate thickening of the aortic valve. Aortic valve regurgitation is not visualized. Mild to moderate aortic valve sclerosis/calcification is present, without any evidence of aortic stenosis. Pulmonic Valve: The pulmonic valve was normal in structure. Pulmonic valve regurgitation is not visualized. No evidence of pulmonic stenosis. Aorta: The aortic root is normal in size and structure. Venous: The inferior vena cava was not well visualized. IAS/Shunts: No atrial level shunt detected by color flow Doppler. Agitated saline contrast was given intravenously to evaluate for intracardiac shunting.  LEFT VENTRICLE PLAX 2D LVIDd:         3.60 cm  Diastology LVIDs:         2.40 cm  LV e' medial:    4.57 cm/s LV PW:         1.20 cm  LV E/e' medial:  12.2 LV IVS:        1.20 cm  LV e' lateral:   7.18  cm/s LVOT diam:     2.00 cm  LV E/e' lateral: 7.8 LV SV:         59 LV SV Index:   37 LVOT Area:     3.14 cm  RIGHT VENTRICLE RV Basal diam:  2.40 cm RV S prime:     10.70 cm/s TAPSE (M-mode): 1.3 cm LEFT ATRIUM             Index       RIGHT ATRIUM           Index LA diam:        3.10 cm 1.95 cm/m  RA Area:     10.60 cm LA Vol (A2C):   46.6 ml 29.30 ml/m RA Volume:   22.30 ml  14.02 ml/m LA Vol (A4C):  27.4 ml 17.23 ml/m LA Biplane Vol: 37.5 ml 23.58 ml/m  AORTIC VALVE LVOT Vmax:   93.30 cm/s LVOT Vmean:  73.200 cm/s LVOT VTI:    0.189 m  AORTA Ao Root diam: 2.90 cm Ao Asc diam:  2.50 cm MITRAL VALVE MV Area (PHT): 3.28 cm    SHUNTS MV Decel Time: 231 msec    Systemic VTI:  0.19 m MV E velocity: 55.70 cm/s  Systemic Diam: 2.00 cm MV A velocity: 84.00 cm/s MV E/A ratio:  0.66 Fransico Him MD Electronically signed by Fransico Him MD Signature Date/Time: 03/14/2021/11:17:09 AM    Final                LOS: 0 days   Tifini Reeder  [facility]   Pager on [email or web address]. If 7PM-7AM, please contact night-coverage at [email or web address]     03/15/2021, 7:36 AM

## 2021-03-16 DIAGNOSIS — G459 Transient cerebral ischemic attack, unspecified: Secondary | ICD-10-CM | POA: Diagnosis not present

## 2021-03-16 NOTE — Plan of Care (Signed)
  Problem: Health Behavior/Discharge Planning: Goal: Ability to manage health-related needs will improve Outcome: Progressing   Problem: Clinical Measurements: Goal: Ability to maintain clinical measurements within normal limits will improve Outcome: Progressing Goal: Diagnostic test results will improve Outcome: Progressing Goal: Cardiovascular complication will be avoided Outcome: Progressing   Problem: Activity: Goal: Risk for activity intolerance will decrease Outcome: Progressing   Problem: Nutrition: Goal: Adequate nutrition will be maintained Outcome: Progressing   Problem: Coping: Goal: Level of anxiety will decrease Outcome: Progressing   Problem: Elimination: Goal: Will not experience complications related to bowel motility Outcome: Progressing Goal: Will not experience complications related to urinary retention Outcome: Progressing   Problem: Pain Managment: Goal: General experience of comfort will improve Outcome: Progressing   Problem: Safety: Goal: Ability to remain free from injury will improve Outcome: Progressing

## 2021-03-16 NOTE — Plan of Care (Signed)

## 2021-03-16 NOTE — Progress Notes (Addendum)
Progress Note    Jodi Bowman  BZJ:[id number] DOB: [birth year]  DOA: 03/12/2021 PCP: Aletha Halim., PA-C      Brief Narrative:    Medical records reviewed and are as summarized below:  Jodi Bowman is a 60 y.o. female with medical history significant of fibromyalgia; s/p gastric bypass (2017); HLD; HTN; OSA, chronic back pain s/p spinal cord stimulator and CVA in 2000.  She was brought to the hospital because of confusion, hallucinations and a fall at home.  Her husband said he noticed a facial droop.  She is on opiate analgesics and she is being weaned off of opioids since placement of a spinal cord stimulator.  She was admitted to the hospital because of concerns for acute stroke.  However, MRI brain did not show any evidence of acute stroke.  It is suspected that patient might have had a TIA.   Assessment/Plan:   Principal Problem:   Acute metabolic encephalopathy Active Problems:   Essential hypertension   Chronic pain syndrome   Mood disorder (HCC)    Body mass index is 28.5 kg/m.  Probable TIA: Resume home aspirin.  CT head and MRI brain did not show any acute abnormality. Continue aspirin.    Suspect small hazy mobile density on the ventricular side of the AV leaflets that may represent an artifact versus small mass.  I spoke to Dr. Harrell Gave, cardiologist, on 03/15/2021 about this case.  She recommended TEE prior to discharge.  She said the earliest this can be done will be on Monday, 03/18/2021.  Case was also discussed with Dr. Cheral Marker, neurologist, on 03/15/2021.  He recommended TEE prior to discharge given strong suspicion of TIA and high risk for stroke.  She and her sister want to know what is going on with the heart before discharge to home.  Confusion, delirium, anxiety: Improved.  Haldol as needed for agitation.  Ativan as needed for anxiety  Hypertension: Resume home antihypertensives.  Hyperlipidemia: LDL 32, HDL 68, triglycerides 21, total  cholesterol 104  Hypokalemia and hypomagnesemia: Improved  Chronic diastolic CHF: 2D echo showed EF estimated at 65 to 70%, mild concentric LVH, grade 1 diastolic dysfunction,  Chronic back pain: S/p spinal cord stimulator in place.  Analgesics as needed for pain.  OSA: Continue CPAP at night.  Mood disorder: Continue psychotropics  Acute urinary retention: Improved.  She required in and out urethral catheterization on the morning of 03/13/2021.  Plan of care was discussed with the sister at the bedside.   Diet Order             Diet Heart Room service appropriate? Yes; Fluid consistency: Thin  Diet effective now                      Consultants: Neurologist  Procedures: None    Medications:     stroke: mapping our early stages of recovery book   Does not apply Once   (feeding supplement) PROSource Plus  30 mL Oral BID BM   aspirin EC  81 mg Oral Daily   DULoxetine  60 mg Oral Daily   enoxaparin (LOVENOX) injection  40 mg Subcutaneous Q24H   escitalopram  10 mg Oral QHS   feeding supplement  237 mL Oral BID BM   losartan  100 mg Oral Daily   And   hydrochlorothiazide  12.5 mg Oral Daily   isosorbide mononitrate  30 mg Oral Daily   lidocaine  1  patch Transdermal Q24H   loratadine  10 mg Oral Daily   metoprolol succinate  100 mg Oral QHS   mirtazapine  7.5 mg Oral QHS   morphine  30 mg Oral Q12H   pantoprazole  40 mg Oral BID   pregabalin  75 mg Oral TID   Ensure Max Protein  11 oz Oral BID   spironolactone  25 mg Oral Daily   vitamin B-12  2,000 mcg Oral Daily   Continuous Infusions:     Anti-infectives (From admission, onward)    None              Family Communication/Anticipated D/C date and plan/Code Status   DVT prophylaxis: enoxaparin (LOVENOX) injection 40 mg Start: 03/12/21 2200     Code Status: DNR  Family Communication: Her sister at the bedside Disposition Plan:    Status is: Observation  The patient will require  care spanning > 2 midnights and should be moved to inpatient because: Inpatient level of care appropriate due to severity of illness  Dispo: The patient is from: Home              Anticipated d/c is to: Home              Patient currently is not medically stable to d/c.   Difficult to place patient No           Subjective:   Interval events noted.  She has no complaints.  She feels better today.  Her sister was at the bedside.  Objective:    Vitals:   03/15/21 1514 03/15/21 2006 03/15/21 2323 03/16/21 1007  BP: (!) 144/79 (!) 144/90 114/67 101/70  Pulse: 91 70 74 81  Resp: 18 18 15    Temp: 98.1 F (36.7 C) 98.2 F (36.8 C) 98.2 F (36.8 C) 98.4 F (36.9 C)  TempSrc: Oral Oral Oral Oral  SpO2: 100% 100% 99% 99%  Weight:      Height:       No data found.  No intake or output data in the 24 hours ending 03/16/21 1530  Filed Weights   03/12/21 1411  Weight: 64 kg    Exam:  GEN: NAD SKIN: No rash EYES: EOMI ENT: MMM CV: RRR PULM: CTA B ABD: soft, ND, NT, +BS CNS: AAO x 3, non focal EXT: No edema or tenderness       Data Reviewed:   I have personally reviewed following labs and imaging studies:  Labs: Labs show the following:   Basic Metabolic Panel: Recent Labs  Lab 03/12/21 1331 03/12/21 1338 03/12/21 1932 03/13/21 0953 03/14/21 0215  NA 139 141  --  141 142  K 3.2* 3.2*  --  3.0* 3.9  CL 105 106  --  108 108  CO2 28  --   --  27 29  GLUCOSE 84 77  --  84 70  BUN 19 20  --  19 17  CREATININE 1.10* 1.10*  --  0.89 0.75  CALCIUM 8.4*  --   --  8.2* 8.0*  MG  --   --  1.6* 1.4* 1.8  PHOS  --   --  3.5 3.3  --    GFR Estimated Creatinine Clearance: 60 mL/min (by C-G formula based on SCr of 0.75 mg/dL). Liver Function Tests: Recent Labs  Lab 03/12/21 1331  AST 36  ALT 29  ALKPHOS 72  BILITOT 0.6  PROT 5.5*  ALBUMIN 2.5*   No results for input(s):  LIPASE, AMYLASE in the last 168 hours. No results for input(s): AMMONIA in the  last 168 hours. Coagulation profile Recent Labs  Lab 03/12/21 1331  INR 1.1    CBC: Recent Labs  Lab 03/12/21 1331 03/12/21 1338 03/14/21 0215  WBC 10.8*  --  9.3  NEUTROABS 6.6  --  5.1  HGB 11.9* 13.6 10.7*  HCT 37.5 40.0 31.5*  MCV 96.4  --  91.3  PLT 178  --  154   Cardiac Enzymes: No results for input(s): CKTOTAL, CKMB, CKMBINDEX, TROPONINI in the last 168 hours. BNP (last 3 results) No results for input(s): PROBNP in the last 8760 hours. CBG: Recent Labs  Lab 03/12/21 1324  GLUCAP 93   D-Dimer: No results for input(s): DDIMER in the last 72 hours. Hgb A1c: No results for input(s): HGBA1C in the last 72 hours.  Lipid Profile: No results for input(s): CHOL, HDL, LDLCALC, TRIG, CHOLHDL, LDLDIRECT in the last 72 hours.  Thyroid function studies: No results for input(s): TSH, T4TOTAL, T3FREE, THYROIDAB in the last 72 hours.  Invalid input(s): FREET3 Anemia work up: No results for input(s): VITAMINB12, FOLATE, FERRITIN, TIBC, IRON, RETICCTPCT in the last 72 hours.  Sepsis Labs: Recent Labs  Lab 03/12/21 1331 03/14/21 0215  WBC 10.8* 9.3    Microbiology Recent Results (from the past 240 hour(s))  Resp Panel by RT-PCR (Flu A&B, Covid) Nasopharyngeal Swab     Status: None   Collection Time: 03/12/21  1:31 PM   Specimen: Nasopharyngeal Swab; Nasopharyngeal(NP) swabs in vial transport medium  Result Value Ref Range Status   SARS Coronavirus 2 by RT PCR NEGATIVE NEGATIVE Final    Comment: (NOTE) SARS-CoV-2 target nucleic acids are NOT DETECTED.  The SARS-CoV-2 RNA is generally detectable in upper respiratory specimens during the acute phase of infection. The lowest concentration of SARS-CoV-2 viral copies this assay can detect is 138 copies/mL. A negative result does not preclude SARS-Cov-2 infection and should not be used as the sole basis for treatment or other patient management decisions. A negative result may occur with  improper specimen  collection/handling, submission of specimen other than nasopharyngeal swab, presence of viral mutation(s) within the areas targeted by this assay, and inadequate number of viral copies(<138 copies/mL). A negative result must be combined with clinical observations, patient history, and epidemiological information. The expected result is Negative.  Fact Sheet for Patients:  EntrepreneurPulse.com.au  Fact Sheet for Healthcare Providers:  IncredibleEmployment.be  This test is no t yet approved or cleared by the [place] FDA and  has been authorized for detection and/or diagnosis of SARS-CoV-2 by FDA under an Emergency Use Authorization (EUA). This EUA will remain  in effect (meaning this test can be used) for the duration of the COVID-19 declaration under Section 564(b)(1) of the Act, 21 U.S.C.section 360bbb-3(b)(1), unless the authorization is terminated  or revoked sooner.       Influenza A by PCR NEGATIVE NEGATIVE Final   Influenza B by PCR NEGATIVE NEGATIVE Final    Comment: (NOTE) The Xpert Xpress SARS-CoV-2/FLU/RSV plus assay is intended as an aid in the diagnosis of influenza from Nasopharyngeal swab specimens and should not be used as a sole basis for treatment. Nasal washings and aspirates are unacceptable for Xpert Xpress SARS-CoV-2/FLU/RSV testing.  Fact Sheet for Patients: EntrepreneurPulse.com.au  Fact Sheet for Healthcare Providers: IncredibleEmployment.be  This test is not yet approved or cleared by the [place] FDA and has been authorized for detection and/or diagnosis of SARS-CoV-2 by FDA under  an Emergency Use Authorization (EUA). This EUA will remain in effect (meaning this test can be used) for the duration of the COVID-19 declaration under Section 564(b)(1) of the Act, 21 U.S.C. section 360bbb-3(b)(1), unless the authorization is terminated or revoked.  Performed at [facility], [place] [address], [place], [place] [address]     Procedures and diagnostic studies:  No results found.             LOS: 0 days   Janki Dike  [facility]   Pager on [email or web address]. If 7PM-7AM, please contact night-coverage at [email or web address]     03/16/2021, 3:30 PM

## 2021-03-16 NOTE — Progress Notes (Signed)
Patient refused use of CPAP for th evening.

## 2021-03-16 NOTE — Plan of Care (Signed)
  Problem: Education: Goal: Knowledge of General Education information will improve Description: Including pain rating scale, medication(s)/side effects and non-pharmacologic comfort measures Outcome: Completed/Met

## 2021-03-17 DIAGNOSIS — I639 Cerebral infarction, unspecified: Secondary | ICD-10-CM | POA: Diagnosis not present

## 2021-03-17 DIAGNOSIS — G894 Chronic pain syndrome: Secondary | ICD-10-CM | POA: Diagnosis present

## 2021-03-17 DIAGNOSIS — R5381 Other malaise: Secondary | ICD-10-CM | POA: Diagnosis present

## 2021-03-17 DIAGNOSIS — Z9884 Bariatric surgery status: Secondary | ICD-10-CM | POA: Diagnosis not present

## 2021-03-17 DIAGNOSIS — Z9049 Acquired absence of other specified parts of digestive tract: Secondary | ICD-10-CM | POA: Diagnosis not present

## 2021-03-17 DIAGNOSIS — I361 Nonrheumatic tricuspid (valve) insufficiency: Secondary | ICD-10-CM | POA: Diagnosis not present

## 2021-03-17 DIAGNOSIS — G4733 Obstructive sleep apnea (adult) (pediatric): Secondary | ICD-10-CM | POA: Diagnosis present

## 2021-03-17 DIAGNOSIS — R29711 NIHSS score 11: Secondary | ICD-10-CM | POA: Diagnosis present

## 2021-03-17 DIAGNOSIS — R1319 Other dysphagia: Secondary | ICD-10-CM | POA: Diagnosis not present

## 2021-03-17 DIAGNOSIS — I959 Hypotension, unspecified: Secondary | ICD-10-CM | POA: Diagnosis not present

## 2021-03-17 DIAGNOSIS — Y92000 Kitchen of unspecified non-institutional (private) residence as  the place of occurrence of the external cause: Secondary | ICD-10-CM | POA: Diagnosis not present

## 2021-03-17 DIAGNOSIS — Z20822 Contact with and (suspected) exposure to covid-19: Secondary | ICD-10-CM | POA: Diagnosis present

## 2021-03-17 DIAGNOSIS — I110 Hypertensive heart disease with heart failure: Secondary | ICD-10-CM | POA: Diagnosis present

## 2021-03-17 DIAGNOSIS — G9341 Metabolic encephalopathy: Secondary | ICD-10-CM | POA: Diagnosis present

## 2021-03-17 DIAGNOSIS — G459 Transient cerebral ischemic attack, unspecified: Secondary | ICD-10-CM | POA: Diagnosis present

## 2021-03-17 DIAGNOSIS — R1310 Dysphagia, unspecified: Secondary | ICD-10-CM | POA: Diagnosis present

## 2021-03-17 DIAGNOSIS — R29810 Facial weakness: Secondary | ICD-10-CM | POA: Diagnosis present

## 2021-03-17 DIAGNOSIS — F39 Unspecified mood [affective] disorder: Secondary | ICD-10-CM | POA: Diagnosis present

## 2021-03-17 DIAGNOSIS — R300 Dysuria: Secondary | ICD-10-CM | POA: Diagnosis present

## 2021-03-17 DIAGNOSIS — E876 Hypokalemia: Secondary | ICD-10-CM | POA: Diagnosis present

## 2021-03-17 DIAGNOSIS — I10 Essential (primary) hypertension: Secondary | ICD-10-CM | POA: Diagnosis not present

## 2021-03-17 DIAGNOSIS — E785 Hyperlipidemia, unspecified: Secondary | ICD-10-CM | POA: Diagnosis present

## 2021-03-17 DIAGNOSIS — K219 Gastro-esophageal reflux disease without esophagitis: Secondary | ICD-10-CM | POA: Diagnosis present

## 2021-03-17 DIAGNOSIS — I071 Rheumatic tricuspid insufficiency: Secondary | ICD-10-CM | POA: Diagnosis present

## 2021-03-17 DIAGNOSIS — Z8673 Personal history of transient ischemic attack (TIA), and cerebral infarction without residual deficits: Secondary | ICD-10-CM | POA: Diagnosis not present

## 2021-03-17 DIAGNOSIS — M797 Fibromyalgia: Secondary | ICD-10-CM | POA: Diagnosis present

## 2021-03-17 DIAGNOSIS — R443 Hallucinations, unspecified: Secondary | ICD-10-CM | POA: Diagnosis present

## 2021-03-17 DIAGNOSIS — I5032 Chronic diastolic (congestive) heart failure: Secondary | ICD-10-CM | POA: Diagnosis present

## 2021-03-17 DIAGNOSIS — Z66 Do not resuscitate: Secondary | ICD-10-CM | POA: Diagnosis present

## 2021-03-17 DIAGNOSIS — W1830XA Fall on same level, unspecified, initial encounter: Secondary | ICD-10-CM | POA: Diagnosis present

## 2021-03-17 MED ORDER — SODIUM CHLORIDE 0.9 % IV BOLUS
500.0000 mL | Freq: Once | INTRAVENOUS | Status: AC
  Administered 2021-03-17: 500 mL via INTRAVENOUS

## 2021-03-17 MED ORDER — METOPROLOL SUCCINATE ER 100 MG PO TB24
100.0000 mg | ORAL_TABLET | Freq: Every day | ORAL | Status: DC
  Administered 2021-03-18 – 2021-03-21 (×4): 100 mg via ORAL
  Filled 2021-03-17 (×4): qty 1

## 2021-03-17 MED ORDER — SODIUM CHLORIDE 0.9 % IV SOLN: Freq: Once | INTRAVENOUS | Status: AC

## 2021-03-17 MED ORDER — CLOPIDOGREL BISULFATE 75 MG PO TABS
75.0000 mg | ORAL_TABLET | Freq: Every day | ORAL | Status: DC
  Administered 2021-03-17 – 2021-03-22 (×6): 75 mg via ORAL
  Filled 2021-03-17 (×7): qty 1

## 2021-03-17 NOTE — Progress Notes (Signed)
TEE to evaluate the equivocal mobile density on the ventricular side of the AV leaflets which was seen on TTE.   Adding Plavix to ASA as she had TIA symptoms while taking ASA and is therefore classifiable as having failed antiplatelet monotherapy.  Call Neurohospitalist team if the TEE confirms the TTE finding. No need to call inpatient Neurology if the TEE reveals that the TTE finding was artifactual.   Will need outpatient Neurology follow up.   Electronically signed: Dr. Kerney Elbe

## 2021-03-17 NOTE — Progress Notes (Signed)
PROGRESS NOTE    Jodi Bowman  SFK:[id number] DOB: March 28, 1961 DOA: 03/12/2021 PCP: Aletha Halim., PA-C     Brief Narrative:  Patient is a 60 year old female with a history of fibromyalgia, hypertension, hyperlipidemia, sleep apnea, chronic back pain with a spinal cord stimulator, and a remote history of CVA in 2000.  She came in for confusion and hallucinations, her husband noted a facial droop.  She is in the process of being weaned off of opiates in place of a spinal cord stimulator.  Stroke work-up has been negative so far.  Transthoracic echo showed possible vegetation versus motion artifact.   New events last 24 hours / Subjective: The patient's blood pressure dropped to the 17C systolic overnight and she received a 500 mL bolus of normal saline.  Blood pressure remains the same.  She is on spironolactone, losartan, hydrochlorothiazide, and Toprol for blood pressure control.  She has not been eating or drinking as much due to dislike of the hospital food.  She feels fine otherwise.  Assessment & Plan:   Principal Problem:   Acute metabolic encephalopathy  Resolved  Neurology has signed off, imaging has been unremarkable  TEE tentatively scheduled for tomorrow; if WNL can DC, if neg will reach out to Neuro again; I sent a staff message to [name] to ensure this is done  Dual antiplatelet therapy with baby aspirin and Plavix 75 mg daily  Hold further lab draws until outpatient follow-up   Active Problems:   Essential hypertension  Continue Toprol XL 50 mg daily and spironolactone 25 mg daily  Hold home losartan and hydrochlorothiazide given hypotensive readings overnight    Chronic pain syndrome  Continue MS Contin 30 mg twice daily  Continue Norco 10-325 mg twice daily as needed  Continue Lidoderm patches and Voltaren gel  Continue Cymbalta 60 mg daily  Continue Lyrica 75 mg 3 times daily    Mood disorder (HCC)  Continue Remeron 7.5 mg daily  Continue  Lexapro 10 mg daily   Nutrition Problem: Inadequate oral intake Etiology: poor appetite   DVT prophylaxis: Lovenox Code Status: Full Family Communication: Self Coming From: Home Disposition Plan: Home Barriers to Discharge: Waiting on TEE results  Consultants:  Neurology  Procedures:  None  Objective: Vitals:   03/16/21 1928 03/16/21 2315 03/17/21 0411 03/17/21 0755  BP: 95/64 106/61 (!) 83/54 (!) 85/50  Pulse: 78 71 62 (!) 57  Resp: 16 18 16 16   Temp: 98.1 F (36.7 C) 98.1 F (36.7 C) 98.1 F (36.7 C) 98.6 F (37 C)  TempSrc: Oral Oral Oral Oral  SpO2: 100% 100% 100% 96%  Weight:      Height:       No intake or output data in the 24 hours ending 03/17/21 1016 Filed Weights   03/12/21 1411  Weight: 64 kg    Examination:  General exam: Appears calm and comfortable  Respiratory system: Clear to auscultation. Respiratory effort normal. No respiratory distress. No conversational dyspnea.  Cardiovascular system: S1 & S2 heard, RRR. No pedal edema. Gastrointestinal system: Abdomen is nondistended, soft and nontender. Normal bowel sounds heard. Central nervous system: Alert and oriented. No focal neurological deficits. Speech clear.  Grip strength adequate. Extremities: Symmetric in appearance  Skin: No rashes, lesions or ulcers on exposed skin  Psychiatry: Judgement and insight appear normal. Mood & affect appropriate.   Data Reviewed: I have personally reviewed following labs and imaging studies  CBC: Recent Labs  Lab 03/12/21 1331 03/12/21 1338  03/14/21 0215  WBC 10.8*  --  9.3  NEUTROABS 6.6  --  5.1  HGB 11.9* 13.6 10.7*  HCT 37.5 40.0 31.5*  MCV 96.4  --  91.3  PLT 178  --  301   Basic Metabolic Panel: Recent Labs  Lab 03/12/21 1331 03/12/21 1338 03/12/21 1932 03/13/21 0953 03/14/21 0215  NA 139 141  --  141 142  K 3.2* 3.2*  --  3.0* 3.9  CL 105 106  --  108 108  CO2 28  --   --  27 29  GLUCOSE 84 77  --  84 70  BUN 19 20  --  19 17   CREATININE 1.10* 1.10*  --  0.89 0.75  CALCIUM 8.4*  --   --  8.2* 8.0*  MG  --   --  1.6* 1.4* 1.8  PHOS  --   --  3.5 3.3  --    GFR: Estimated Creatinine Clearance: 60 mL/min (by C-G formula based on SCr of 0.75 mg/dL).  Liver Function Tests: Recent Labs  Lab 03/12/21 1331  AST 36  ALT 29  ALKPHOS 72  BILITOT 0.6  PROT 5.5*  ALBUMIN 2.5*    Coagulation Profile: Recent Labs  Lab 03/12/21 1331  INR 1.1   CBG: Recent Labs  Lab 03/12/21 1324  GLUCAP 93   Recent Results (from the past 240 hour(s))  Resp Panel by RT-PCR (Flu A&B, Covid) Nasopharyngeal Swab     Status: None   Collection Time: 03/12/21  1:31 PM   Specimen: Nasopharyngeal Swab; Nasopharyngeal(NP) swabs in vial transport medium  Result Value Ref Range Status   SARS Coronavirus 2 by RT PCR NEGATIVE NEGATIVE Final    Comment: (NOTE) SARS-CoV-2 target nucleic acids are NOT DETECTED.  The SARS-CoV-2 RNA is generally detectable in upper respiratory specimens during the acute phase of infection. The lowest concentration of SARS-CoV-2 viral copies this assay can detect is 138 copies/mL. A negative result does not preclude SARS-Cov-2 infection and should not be used as the sole basis for treatment or other patient management decisions. A negative result may occur with  improper specimen collection/handling, submission of specimen other than nasopharyngeal swab, presence of viral mutation(s) within the areas targeted by this assay, and inadequate number of viral copies(<138 copies/mL). A negative result must be combined with clinical observations, patient history, and epidemiological information. The expected result is Negative.  Fact Sheet for Patients:  EntrepreneurPulse.com.au  Fact Sheet for Healthcare Providers:  IncredibleEmployment.be  This test is no t yet approved or cleared by the [place] FDA and  has been authorized for detection and/or diagnosis of  SARS-CoV-2 by FDA under an Emergency Use Authorization (EUA). This EUA will remain  in effect (meaning this test can be used) for the duration of the COVID-19 declaration under Section 564(b)(1) of the Act, 21 U.S.C.section 360bbb-3(b)(1), unless the authorization is terminated  or revoked sooner.       Influenza A by PCR NEGATIVE NEGATIVE Final   Influenza B by PCR NEGATIVE NEGATIVE Final    Comment: (NOTE) The Xpert Xpress SARS-CoV-2/FLU/RSV plus assay is intended as an aid in the diagnosis of influenza from Nasopharyngeal swab specimens and should not be used as a sole basis for treatment. Nasal washings and aspirates are unacceptable for Xpert Xpress SARS-CoV-2/FLU/RSV testing.  Fact Sheet for Patients: EntrepreneurPulse.com.au  Fact Sheet for Healthcare Providers: IncredibleEmployment.be  This test is not yet approved or cleared by the [place] FDA and has been  authorized for detection and/or diagnosis of SARS-CoV-2 by FDA under an Emergency Use Authorization (EUA). This EUA will remain in effect (meaning this test can be used) for the duration of the COVID-19 declaration under Section 564(b)(1) of the Act, 21 U.S.C. section 360bbb-3(b)(1), unless the authorization is terminated or revoked.  Performed at [facility], [place] [address], [place],  [address]       Scheduled Meds:   stroke: mapping our early stages of recovery book   Does not apply Once   (feeding supplement) PROSource Plus  30 mL Oral BID BM   aspirin EC  81 mg Oral Daily   clopidogrel  75 mg Oral Daily   DULoxetine  60 mg Oral Daily   enoxaparin (LOVENOX) injection  40 mg Subcutaneous Q24H   escitalopram  10 mg Oral QHS   feeding supplement  237 mL Oral BID BM   isosorbide mononitrate  30 mg Oral Daily   lidocaine  1 patch Transdermal Q24H   loratadine  10 mg Oral Daily   metoprolol succinate  100 mg Oral QHS   mirtazapine  7.5 mg Oral QHS    morphine  30 mg Oral Q12H   pantoprazole  40 mg Oral BID   pregabalin  75 mg Oral TID   Ensure Max Protein  11 oz Oral BID   spironolactone  25 mg Oral Daily   vitamin B-12  2,000 mcg Oral Daily   Continuous Infusions:  sodium chloride 999 mL/hr at 03/17/21 1002     LOS: 5 days    Time spent: 30 minutes   Shelda Pal, DO [facility] 03/17/2021, 10:16 AM   Available via Epic secure chat 7am-7pm After these hours, please refer to coverage provider listed on [email or web address]

## 2021-03-17 NOTE — Progress Notes (Signed)
Pt bp 83/54. MD notified

## 2021-03-17 NOTE — Progress Notes (Signed)
Cross-coverage note:   BP 83/54. Patient does not have any complaints. Plan to give 500 mL IVF and continue close monitoring.

## 2021-03-17 NOTE — Plan of Care (Signed)
  Problem: Health Behavior/Discharge Planning: Goal: Ability to manage health-related needs will improve Outcome: Progressing   Problem: Clinical Measurements: Goal: Ability to maintain clinical measurements within normal limits will improve Outcome: Progressing Goal: Diagnostic test results will improve Outcome: Progressing Goal: Cardiovascular complication will be avoided Outcome: Progressing   Problem: Activity: Goal: Risk for activity intolerance will decrease Outcome: Progressing   Problem: Nutrition: Goal: Adequate nutrition will be maintained Outcome: Progressing   Problem: Coping: Goal: Level of anxiety will decrease Outcome: Progressing   Problem: Elimination: Goal: Will not experience complications related to bowel motility Outcome: Progressing Goal: Will not experience complications related to urinary retention Outcome: Progressing   Problem: Pain Managment: Goal: General experience of comfort will improve Outcome: Progressing   Problem: Safety: Goal: Ability to remain free from injury will improve Outcome: Progressing

## 2021-03-18 LAB — CBC
HCT: 35.2 % — ABNORMAL LOW (ref 36.0–46.0)
Hemoglobin: 11.4 g/dL — ABNORMAL LOW (ref 12.0–15.0)
MCH: 31.3 pg (ref 26.0–34.0)
MCHC: 32.4 g/dL (ref 30.0–36.0)
MCV: 96.7 fL (ref 80.0–100.0)
Platelets: 155 10*3/uL (ref 150–400)
RBC: 3.64 MIL/uL — ABNORMAL LOW (ref 3.87–5.11)
RDW: 17.3 % — ABNORMAL HIGH (ref 11.5–15.5)
WBC: 11.5 10*3/uL — ABNORMAL HIGH (ref 4.0–10.5)
nRBC: 0 % (ref 0.0–0.2)

## 2021-03-18 LAB — BASIC METABOLIC PANEL
Anion gap: 3 — ABNORMAL LOW (ref 5–15)
BUN: 25 mg/dL — ABNORMAL HIGH (ref 8–23)
CO2: 29 mmol/L (ref 22–32)
Calcium: 8 mg/dL — ABNORMAL LOW (ref 8.9–10.3)
Chloride: 111 mmol/L (ref 98–111)
Creatinine, Ser: 0.83 mg/dL (ref 0.44–1.00)
GFR, Estimated: >60 mL/min (ref >60)
Glucose, Bld: 87 mg/dL (ref 70–99)
Potassium: 4 mmol/L (ref 3.5–5.1)
Sodium: 143 mmol/L (ref 135–145)

## 2021-03-18 LAB — VITAMIN B1: Vitamin B1 (Thiamine): 137.1 nmol/L (ref 66.5–200.0)

## 2021-03-18 NOTE — Progress Notes (Signed)
Occupational Therapy Treatment/Discharge  Patient Details Name: Jodi Bowman MRN: [id number] DOB: 1961-04-26 Today's Date: 03/18/2021    History of present illness Pt is a 60 y/o female presenting on 03/12/2021 with weakness and facial droop after sustaining a fall. ED course includes stroke rule-out, negative MRI/CTA. PMH significant for fibromyalgia; s/p gastric bypass (2017); HLD; HTN; OSA; and CVA in 2000. Of note, pt with recent spinal cord stimulator implantation for chronic pain.   OT comments  Pt progressing toward OT goals. Currently, pt completing ADLs and functional mobility using RW with supervision for safety. Pt requiring min A for LB dressing to thread one leg in pants, but reports she had help with this at home at baseline. Pt near baseline function and plans on having support at home. All acute OT needs met. Recommend discharge home with no follow up OT at this time. Re-consult if change in status. OT to sign off.    Follow Up Recommendations  No OT follow up    Equipment Recommendations  None recommended by OT    Recommendations for Other Services      Precautions / Restrictions Precautions Precautions: None Precaution Comments: Spinal cord stimulator with chronic back pain Restrictions Weight Bearing Restrictions: No       Mobility Bed Mobility Overal bed mobility: Modified Independent Bed Mobility: Supine to Sit     Supine to sit: Supervision;HOB elevated     General bed mobility comments: Mod I for increased time    Transfers Overall transfer level: Needs assistance Equipment used: Rolling walker (2 wheeled) Transfers: Sit to/from Stand Sit to Stand: Supervision         General transfer comment: Pt requiring increased time and reporting feeling stiff and wobbly this morning    Balance Overall balance assessment: Needs assistance Sitting-balance support: Feet supported Sitting balance-Leahy Scale: Fair Sitting balance - Comments: Pt able  to don pants sitting EOB with no UE support   Standing balance support: Bilateral upper extremity supported;During functional activity Standing balance-Leahy Scale: Fair Standing balance comment: able to static stand without RW completing grooming at sink                           ADL either performed or assessed with clinical judgement   ADL Overall ADL's : Needs assistance/impaired     Grooming: Oral care;Supervision/safety;Standing;Wash/dry face Grooming Details (indicate cue type and reason): RW. Pt completing oral care and washing face with supervision for safety.             Lower Body Dressing: Minimal assistance;Sit to/from stand Lower Body Dressing Details (indicate cue type and reason): Min A to thread second leg when donning pants Toilet Transfer: Supervision/safety;Ambulation;RW;Grab Information systems manager Details (indicate cue type and reason): slowed pace Toileting- Water quality scientist and Hygiene: Sit to/from stand;Supervision/safety       Functional mobility during ADLs: Supervision/safety;Rolling walker General ADL Comments: Pt completing functional mobility with increased time.     Vision   Vision Assessment?: No apparent visual deficits   Perception     Praxis      Cognition Arousal/Alertness: Awake/alert Behavior During Therapy: WFL for tasks assessed/performed Overall Cognitive Status: Within Functional Limits for tasks assessed Area of Impairment: Memory;Following commands                     Memory: Decreased short-term memory Following Commands: Follows one step commands consistently       General Comments:  Pt reqiuiring 2 verbal cues to recall oral care task agreed upon before getting OOB.        Exercises     Shoulder Instructions       General Comments Pt pleasant and conversational throughout.    Pertinent Vitals/ Pain       Pain Assessment: Faces Faces Pain Scale: Hurts a little bit Pain  Location: back Pain Intervention(s): Monitored during session  Home Living                                          Prior Functioning/Environment              Frequency  Min 1X/week        Progress Toward Goals  OT Goals(current goals can now be found in the care plan section)  Progress towards OT goals: Progressing toward goals  Acute Rehab OT Goals Patient Stated Goal: go home OT Goal Formulation: With patient Time For Goal Achievement: 03/27/21 Potential to Achieve Goals: Good ADL Goals Pt Will Perform Grooming: with set-up;standing Pt Will Perform Upper Body Bathing: with set-up;sitting;standing Pt Will Perform Lower Body Bathing: with min assist;sit to/from stand Pt Will Perform Upper Body Dressing: with set-up;sitting;standing Pt Will Perform Lower Body Dressing: with min assist;sit to/from stand Pt Will Transfer to Toilet: with modified independence;ambulating;regular height toilet Pt Will Perform Toileting - Clothing Manipulation and hygiene: with modified independence;sit to/from stand;sitting/lateral leans  Plan Discharge plan remains appropriate    Co-evaluation                 AM-PAC OT "6 Clicks" Daily Activity     Outcome Measure   Help from another person eating meals?: None Help from another person taking care of personal grooming?: A Little Help from another person toileting, which includes using toliet, bedpan, or urinal?: A Little Help from another person bathing (including washing, rinsing, drying)?: A Little Help from another person to put on and taking off regular upper body clothing?: A Little Help from another person to put on and taking off regular lower body clothing?: A Little 6 Click Score: 19    End of Session Equipment Utilized During Treatment: Rolling walker;Gait belt  OT Visit Diagnosis: Unsteadiness on feet (R26.81);History of falling (Z91.81);Pain Pain - part of body:  (back)   Activity Tolerance  Patient tolerated treatment well   Patient Left Other (comment) (in hall with PT)   Nurse Communication Mobility status        Time: 7425-9563 OT Time Calculation (min): 31 min  Charges: OT General Charges $OT Visit: 1 Visit OT Treatments $Self Care/Home Management : 23-37 mins  Shanda Howells, OTDS    Shanda Howells 03/18/2021, 9:42 AM

## 2021-03-18 NOTE — Progress Notes (Signed)
    [facility] has been requested to perform a transesophageal echocardiogram on this patient for abnormal TTE. Discussed procedure with patient including care taken to avoid esophageal damage. She relayed a family history of small esophagus with multiple family members affected. Her mother had her esophagus stretched multiple times. She reports she saw someone in [place] 4 years ago who told her it was somewhat narrowed but not at the point it needed intervention yet. She does not recall if she had barium swallow or endoscopy. She did recall having a colonoscopy. She also reports sensation of getting choked on food and periodically getting food stuck on the way down. Typically esophageal stricture would be a contraindication to TEE so I talked to Dr. Gardiner Rhyme who would be doing to procedure. He would suggest primary team obtain a GI consult for input on possible stricture before we commit to proceeding. The patient was very disappointed when I told her this would likely mean we cannot proceed with the TEE until we had confirmation it was safe to do so but I told her I was glad she told us. I relayed the recommendation to Dr. [place] who will reach out to GI. He is aware to please reach back out to cardiology cardmaster when patient is cleared by GI to proceed with TEE.  Charlie Pitter, PA-C 03/18/2021 5:08 PM

## 2021-03-18 NOTE — Progress Notes (Signed)
Pt requested to CODE status change to FULL CODE, MD on call made aware and was done, pt happy now.

## 2021-03-18 NOTE — Progress Notes (Signed)
PROGRESS NOTE    [name]  YKD:[id number] DOB: Apr 10, 1961 DOA: 03/12/2021 PCP: Aletha Halim., PA-C    Brief Narrative:  Jodi Bowman is a 60 year old female with past medical history significant for fibromyalgia, morbid obesity s/p gastric bypass (2017), hyperlipidemia, essential hypertension, OSA, CVA in 2000 and who presented to [facility] ED on 6/14 with weakness, facial droop.  Husband reports that he works nights and came home about 11:45 AM and she was in the kitchen making lunch, he heard her fall to the floor and noticed a facial droop.  She was able to eat a protein shake without difficulty.  Patient was notably somnolent in the ER, and currently took some "morphine", which she is supposed to be weaning off of her opiate painkillers since her spinal cord stimulator was placed.  Patient with similar episode in November last year which she took too many pills, but does not think that this is the case this time.  Patient does not recall falling, and was reportedly having hallucinations in the ED.  Code stroke was initiated in the ED.  CT head, MRI/CT angiogram negative.  Hospital service consulted for further evaluation and management.   Assessment & Plan:   Principal Problem:   Acute metabolic encephalopathy Active Problems:   Essential hypertension   Chronic pain syndrome   Mood disorder (HCC)   Metabolic encephalopathy   Acute metabolic encephalopathy: Resolved TIA Patient presenting to the ED following fall at home, family noted weakness and facial droop.  Neurology was consulted and followed during hospital course.  EtOH level less than 10.  UDS positive for opiates.  Urinalysis unrevealing.  CT head without contrast with no evidence of acute intracranial abnormality, mild chronic microvascular ischemic disease noted.  MRI brain without contrast with no evidence of acute infarction.  CT angio head/neck with no large vessel occlusion, no hemodynamically  significant stenosis or evidence of dissection; no evidence of cord infarction or penumbra.  TTE with LVEF 39-76%, grade 1 diastolic dysfunction, no MR, no aortic stenosis, but note a small hazy mobile density ventricular side of AV leaflets, artifact versus mass. --Aspirin 81 mg p.o. daily --Plavix 75 mg p.o. daily --Outpatient follow-up with neurology  Aortic valve mass versus artifact TTE with findings of small hazy mobile density ventricular side of AV leaflets, suspicious for artifact versus mass. --TEE: Pending for 6/21 --If TEE confirms TTE finding, will reach back out to neuro hospitalist team  Essential hypertension BP 120/70.  Home antihypertensive regimen includes isosorbide mononitrate 30 mg p.o. daily, losartan-HCTZ 100-12.5 mg p.o. daily, metoprolol succinate 100 mg p.o. nightly, spironolactone 25 mg p.o. daily. --Isosorbide mononitrate 30 mg p.o. daily --Metoprolol succinate 100 mg p.o. nightly --Spironolactone 25 mg p.o. daily --Hold home losartan-HCTZ --Continue monitor BP  Chronic pain syndrome Has spinal cord stimulator in place. --MS Contin 30 mg p.o. twice daily --Norco 10-325 mg p.o. twice daily as needed --Lidoderm patch --Voltaren gel --Cymbalta 60 mg p.o. daily --Lyrica 75 mg p.o. 3 times daily  Mood disorder --Remeron 7.5 mg p.o. daily --Lexapro 10 mg p.o. daily  GERD: Continue PPI twice daily  Weakness/debility/deconditioning: --PT recommends home health on discharge --Continue therapy efforts while inpatient   DVT prophylaxis: enoxaparin (LOVENOX) injection 40 mg Start: 03/12/21 2200    Code Status: Full Code Family Communication: No family present at bedside this morning, attempted to update patient's husband, Gwyndolyn Saxon via telephone unsuccessful this afternoon.  Disposition Plan:  Level of care: Med-Surg Status is: Inpatient  Remains inpatient appropriate because:Ongoing diagnostic testing needed not appropriate for outpatient work up and  Unsafe d/c plan  Dispo: The patient is from: Home              Anticipated d/c is to: Home              Patient currently is not medically stable to d/c.   Difficult to place patient No    Consultants:  Neurology  Procedures:  TTE TEE: Pending  Antimicrobials:  None   Subjective: Resting comfortably in bedside chair.  Slightly upset that she is not having the TEE today; which is pending for tomorrow.  No other questions or concerns at this time.  Denies headache, no visual changes, no chest pain, no palpitations, no shortness of breath, no abdominal pain, no weakness, no fatigue, no paresthesias.  No acute events overnight per nursing staff.  Objective: Vitals:   03/18/21 0115 03/18/21 0416 03/18/21 0712 03/18/21 1200  BP: 114/66 120/70 109/68 118/83  Pulse: 82 85 85 88  Resp: 18  16 18   Temp: 98.6 F (37 C) 98.2 F (36.8 C) 98.6 F (37 C) 98.2 F (36.8 C)  TempSrc: Oral Oral Oral Oral  SpO2: 100% 100% 99% 100%  Weight:  61.2 kg    Height:        Intake/Output Summary (Last 24 hours) at 03/18/2021 1546 Last data filed at 03/17/2021 1800 Gross per 24 hour  Intake 320 ml  Output --  Net 320 ml   Filed Weights   03/12/21 1411 03/18/21 0416  Weight: 64 kg 61.2 kg    Examination:  General exam: Appears calm and comfortable  Respiratory system: Clear to auscultation. Respiratory effort normal.  On room air Cardiovascular system: S1 & S2 heard, RRR. No JVD, murmurs, rubs, gallops or clicks. No pedal edema. Gastrointestinal system: Abdomen is nondistended, soft and nontender. No organomegaly or masses felt. Normal bowel sounds heard. Central nervous system: Alert and oriented. No focal neurological deficits. Extremities: Symmetric 5 x 5 power. Skin: No rashes, lesions or ulcers Psychiatry: Judgement and insight appear normal. Mood & affect appropriate.     Data Reviewed: I have personally reviewed following labs and imaging studies  CBC: Recent Labs  Lab  03/12/21 1331 03/12/21 1338 03/14/21 0215 03/18/21 0349  WBC 10.8*  --  9.3 11.5*  NEUTROABS 6.6  --  5.1  --   HGB 11.9* 13.6 10.7* 11.4*  HCT 37.5 40.0 31.5* 35.2*  MCV 96.4  --  91.3 96.7  PLT 178  --  154 932   Basic Metabolic Panel: Recent Labs  Lab 03/12/21 1331 03/12/21 1338 03/12/21 1932 03/13/21 0953 03/14/21 0215 03/18/21 0349  NA 139 141  --  141 142 143  K 3.2* 3.2*  --  3.0* 3.9 4.0  CL 105 106  --  108 108 111  CO2 28  --   --  27 29 29   GLUCOSE 84 77  --  84 70 87  BUN 19 20  --  19 17 25*  CREATININE 1.10* 1.10*  --  0.89 0.75 0.83  CALCIUM 8.4*  --   --  8.2* 8.0* 8.0*  MG  --   --  1.6* 1.4* 1.8  --   PHOS  --   --  3.5 3.3  --   --    GFR: Estimated Creatinine Clearance: 56.6 mL/min (by C-G formula based on SCr of 0.83 mg/dL). Liver Function Tests: Recent Labs  Lab 03/12/21  1331  AST 36  ALT 29  ALKPHOS 72  BILITOT 0.6  PROT 5.5*  ALBUMIN 2.5*   No results for input(s): LIPASE, AMYLASE in the last 168 hours. No results for input(s): AMMONIA in the last 168 hours. Coagulation Profile: Recent Labs  Lab 03/12/21 1331  INR 1.1   Cardiac Enzymes: No results for input(s): CKTOTAL, CKMB, CKMBINDEX, TROPONINI in the last 168 hours. BNP (last 3 results) No results for input(s): PROBNP in the last 8760 hours. HbA1C: No results for input(s): HGBA1C in the last 72 hours. CBG: Recent Labs  Lab 03/12/21 1324  GLUCAP 93   Lipid Profile: No results for input(s): CHOL, HDL, LDLCALC, TRIG, CHOLHDL, LDLDIRECT in the last 72 hours. Thyroid Function Tests: No results for input(s): TSH, T4TOTAL, FREET4, T3FREE, THYROIDAB in the last 72 hours. Anemia Panel: No results for input(s): VITAMINB12, FOLATE, FERRITIN, TIBC, IRON, RETICCTPCT in the last 72 hours. Sepsis Labs: No results for input(s): PROCALCITON, LATICACIDVEN in the last 168 hours.  Recent Results (from the past 240 hour(s))  Resp Panel by RT-PCR (Flu A&B, Covid) Nasopharyngeal Swab      Status: None   Collection Time: 03/12/21  1:31 PM   Specimen: Nasopharyngeal Swab; Nasopharyngeal(NP) swabs in vial transport medium  Result Value Ref Range Status   SARS Coronavirus 2 by RT PCR NEGATIVE NEGATIVE Final    Comment: (NOTE) SARS-CoV-2 target nucleic acids are NOT DETECTED.  The SARS-CoV-2 RNA is generally detectable in upper respiratory specimens during the acute phase of infection. The lowest concentration of SARS-CoV-2 viral copies this assay can detect is 138 copies/mL. A negative result does not preclude SARS-Cov-2 infection and should not be used as the sole basis for treatment or other patient management decisions. A negative result may occur with  improper specimen collection/handling, submission of specimen other than nasopharyngeal swab, presence of viral mutation(s) within the areas targeted by this assay, and inadequate number of viral copies(<138 copies/mL). A negative result must be combined with clinical observations, patient history, and epidemiological information. The expected result is Negative.  Fact Sheet for Patients:  EntrepreneurPulse.com.au  Fact Sheet for Healthcare Providers:  IncredibleEmployment.be  This test is no t yet approved or cleared by the [place] FDA and  has been authorized for detection and/or diagnosis of SARS-CoV-2 by FDA under an Emergency Use Authorization (EUA). This EUA will remain  in effect (meaning this test can be used) for the duration of the COVID-19 declaration under Section 564(b)(1) of the Act, 21 U.S.C.section 360bbb-3(b)(1), unless the authorization is terminated  or revoked sooner.       Influenza A by PCR NEGATIVE NEGATIVE Final   Influenza B by PCR NEGATIVE NEGATIVE Final    Comment: (NOTE) The Xpert Xpress SARS-CoV-2/FLU/RSV plus assay is intended as an aid in the diagnosis of influenza from Nasopharyngeal swab specimens and should not be used as a sole basis  for treatment. Nasal washings and aspirates are unacceptable for Xpert Xpress SARS-CoV-2/FLU/RSV testing.  Fact Sheet for Patients: EntrepreneurPulse.com.au  Fact Sheet for Healthcare Providers: IncredibleEmployment.be  This test is not yet approved or cleared by the [place] FDA and has been authorized for detection and/or diagnosis of SARS-CoV-2 by FDA under an Emergency Use Authorization (EUA). This EUA will remain in effect (meaning this test can be used) for the duration of the COVID-19 declaration under Section 564(b)(1) of the Act, 21 U.S.C. section 360bbb-3(b)(1), unless the authorization is terminated or revoked.  Performed at [facility], [place]  [address], [place], [place] [address]          Radiology Studies: No results found.      Scheduled Meds:   stroke: mapping our early stages of recovery book   Does not apply Once   (feeding supplement) PROSource Plus  30 mL Oral BID BM   aspirin EC  81 mg Oral Daily   clopidogrel  75 mg Oral Daily   DULoxetine  60 mg Oral Daily   enoxaparin (LOVENOX) injection  40 mg Subcutaneous Q24H   escitalopram  10 mg Oral QHS   feeding supplement  237 mL Oral BID BM   isosorbide mononitrate  30 mg Oral Daily   lidocaine  1 patch Transdermal Q24H   loratadine  10 mg Oral Daily   metoprolol succinate  100 mg Oral QHS   mirtazapine  7.5 mg Oral QHS   morphine  30 mg Oral Q12H   pantoprazole  40 mg Oral BID   pregabalin  75 mg Oral TID   Ensure Max Protein  11 oz Oral BID   spironolactone  25 mg Oral Daily   vitamin B-12  2,000 mcg Oral Daily   Continuous Infusions:   LOS: 1 day    Time spent: 41 minutes spent on chart review, discussion with nursing staff, consultants, updating family and interview/physical exam; more than 50% of that time was spent in counseling and/or coordination of care.    Adamae Ricklefs J [place] DO [facility] Available via Epic secure chat  7am-7pm After these hours, please refer to coverage provider listed on [email or web address] 03/18/2021, 3:46 PM

## 2021-03-18 NOTE — Progress Notes (Signed)
Physical Therapy Treatment Patient Details Name: Jodi Bowman MRN: [id number] DOB: March 30, 1961 Today's Date: 03/18/2021    History of Present Illness Pt is a 60 y/o female presenting on 03/12/2021 with weakness and facial droop after sustaining a fall. ED course includes stroke rule-out, negative MRI/CTA. PMH significant for fibromyalgia; s/p gastric bypass (2017); HLD; HTN; OSA; and CVA in 2000. Of note, pt with recent spinal cord stimulator implantation for chronic pain.    PT Comments    Pt required supervision transfers and min guard assist ambulation 350' with RW. Slow, steady gait noted with RW. Pt in recliner with feet elevated at end of session.   Follow Up Recommendations  Home health PT     Equipment Recommendations  None recommended by PT    Recommendations for Other Services       Precautions / Restrictions Precautions Precautions: Other (comment) Precaution Comments: Spinal cord stimulator with chronic back pain Restrictions Weight Bearing Restrictions: No    Mobility  Bed Mobility Overal bed mobility: Modified Independent Bed Mobility: Supine to Sit     Supine to sit: Supervision;HOB elevated     General bed mobility comments: Pt received OOB with OT.    Transfers Overall transfer level: Needs assistance Equipment used: Rolling walker (2 wheeled) Transfers: Sit to/from Stand Sit to Stand: Supervision         General transfer comment: supervision for safety, increased time  Ambulation/Gait Ambulation/Gait assistance: Min guard Gait Distance (Feet): 350 Feet Assistive device: Rolling walker (2 wheeled) Gait Pattern/deviations: Step-through pattern;Decreased stride length Gait velocity: decreased Gait velocity interpretation: <1.31 ft/sec, indicative of household ambulator General Gait Details: slow, steady gait with RW   Stairs             Wheelchair Mobility    Modified Rankin (Stroke Patients Only)       Balance Overall  balance assessment: Needs assistance Sitting-balance support: Feet supported Sitting balance-Leahy Scale: Fair Sitting balance - Comments: Pt able to don pants sitting EOB with no UE support   Standing balance support: Bilateral upper extremity supported;During functional activity Standing balance-Leahy Scale: Poor Standing balance comment: reliant on BUE support                            Cognition Arousal/Alertness: Awake/alert Behavior During Therapy: WFL for tasks assessed/performed Overall Cognitive Status: Within Functional Limits for tasks assessed Area of Impairment: Memory;Following commands;Problem solving                     Memory: Decreased short-term memory Following Commands: Follows one step commands consistently     Problem Solving: Slow processing General Comments: Pt reqiuiring 2 verbal cues to recall oral care task agreed upon before getting OOB.      Exercises      General Comments General comments (skin integrity, edema, etc.): Pt pleasant and conversational throughout.      Pertinent Vitals/Pain Pain Assessment: Faces Faces Pain Scale: Hurts a little bit Pain Location: back Pain Descriptors / Indicators: Discomfort;Grimacing Pain Intervention(s): Monitored during session;Repositioned    Home Living                      Prior Function            PT Goals (current goals can now be found in the care plan section) Acute Rehab PT Goals Patient Stated Goal: home Progress towards PT goals: Progressing toward goals  Frequency    Min 3X/week      PT Plan Current plan remains appropriate    Co-evaluation              AM-PAC PT "6 Clicks" Mobility   Outcome Measure  Help needed turning from your back to your side while in a flat bed without using bedrails?: A Little Help needed moving from lying on your back to sitting on the side of a flat bed without using bedrails?: A Little Help needed moving to and  from a bed to a chair (including a wheelchair)?: A Little Help needed standing up from a chair using your arms (e.g., wheelchair or bedside chair)?: A Little Help needed to walk in hospital room?: A Little Help needed climbing 3-5 steps with a railing? : A Lot 6 Click Score: 17    End of Session Equipment Utilized During Treatment: Gait belt Activity Tolerance: Patient tolerated treatment well Patient left: in chair;with call bell/phone within reach Nurse Communication: Mobility status PT Visit Diagnosis: Other abnormalities of gait and mobility (R26.89);Muscle weakness (generalized) (M62.81);History of falling (Z91.81);Difficulty in walking, not elsewhere classified (R26.2);Pain     Time: 7017-7939 PT Time Calculation (min) (ACUTE ONLY): 17 min  Charges:  $Gait Training: 8-22 mins                     Lorrin Goodell, [place]  Office # [phone] Pager [phone]    [name] 03/18/2021, 10:15 AM

## 2021-03-18 NOTE — Progress Notes (Signed)
Patient refused CPAP for HS.

## 2021-03-18 NOTE — Progress Notes (Signed)
  Pt refused cpap for tonight .Pt stated that she does not wear cpap at home.

## 2021-03-19 ENCOUNTER — Inpatient Hospital Stay (HOSPITAL_COMMUNITY): Payer: Medicare (Managed Care)

## 2021-03-19 DIAGNOSIS — R1319 Other dysphagia: Secondary | ICD-10-CM

## 2021-03-19 DIAGNOSIS — R1310 Dysphagia, unspecified: Secondary | ICD-10-CM

## 2021-03-19 NOTE — Consult Note (Addendum)
Consultation  Referring Provider: No ref. provider found Primary Care Physician:  Aletha Halim., PA-C Primary Gastroenterologist:  Dr. Benson Norway  Reason for Consultation: Dysphagia, rule out esophageal stricture  HPI: Jodi Bowman is a 60 y.o. female, admitted about 6 days ago after she presented to the emergency room with weakness and sleepiness concerning for acute CVA. She has been evaluated by neurology and will work-up is negative for any acute infarct, MRI showed mild chronic small vessel ischemic changes stable from October 2021.  She has been felt to have had a TIA.  2D echo has shown a possible mobile density on the ventricular side of the aortic valve leaflet, and cardiology plans TEE Patient has been started on Plavix and aspirin On discussion with the patient she had related some issues with dysphagia, and therefore GI involvement requested. Patient has history of osteoarthritis, fibromyalgia, GERD, hypertension, chronic pain syndrome, prior CVA for 5 years ago.  She is also status post Roux-en-Y gastric bypass done in 2017.  She says she lost a total of 160 pounds.  Patient believes that she has had prior EGD done in [place] and also had colonoscopy done previously per Dr. Benson Norway he says the last one was 2 to 3 years ago and she is due for follow-up. Barium swallow in 2018 was done which showed no evidence of stricture, remnant stomach and this was done via PEG otherwise unremarkable.  Patient says that she has had chronic issues over the past few years with primarily solid food dysphagia.  She says this occurs on a regular basis, almost daily with a sense of food sitting in her esophagus.  She was told by speech therapy in the past to take some applesauce when this occurs and she thinks this helps push the food on down.  She is not having any difficulty with liquids.  She does endorse history of chronic GERD and is on twice daily Protonix at home.  Barium swallow has  been ordered and is pending this a.m.   Past Medical History:  Diagnosis Date   Arthritis    Calculus of gallbladder without mention of cholecystitis or obstruction    Dizziness    Fibromyalgia    GERD (gastroesophageal reflux disease)    Goiter    Hypercholesteremia    Hypertension    Lower back pain    Migraine    Nontoxic uninodular goiter    sees dr vollmer at [facility]   Obesity    PONV (postoperative nausea and vomiting)    Sleep apnea    STOPBANG=5   Stroke ([place]) 2000    Past Surgical History:  Procedure Laterality Date   ABDOMINAL HYSTERECTOMY     BOWEL RESECTION N/A 03/06/2018   Procedure: SMALL BOWEL ANASTAMOSIS;  Surgeon: Clovis Riley, MD;  Location: [facility];  Service: General;  Laterality: N/A;   CATARACT EXTRACTION W/ INTRAOCULAR LENS IMPLANT Bilateral    CESAREAN SECTION  yrs ago   done x 2   CHOLECYSTECTOMY  01/05/2012   Procedure: LAPAROSCOPIC CHOLECYSTECTOMY WITH INTRAOPERATIVE CHOLANGIOGRAM;  Surgeon: Pedro Earls, MD;  Location: [facility];  Service: General;  Laterality: N/A;   COLONOSCOPY  10/08/2012   Procedure: COLONOSCOPY;  Surgeon: Beryle Beams, MD;  Location: [facility];  Service: Endoscopy;  Laterality: N/A;   KNEE ARTHROSCOPY  one 1995 and 1 in 1997   both knees done   LAPAROSCOPY N/A 03/06/2018   Procedure: LAPAROSCOPY DIAGNOSTIC WITH LYSIS OF ADHESIONS;  Surgeon: Kae Heller,  Victorino Sparrow, MD;  Location: [facility];  Service: General;  Laterality: N/A;   LAPAROTOMY N/A 03/06/2018   Procedure: EXPLORATORY LAPAROTOMY, RESECTION OF DISTAL ROUX, CLOSURE OF INTERNAL HERNIA;  Surgeon: Clovis Riley, MD;  Location: [facility];  Service: General;  Laterality: N/A;   LEFT HEART CATH AND CORONARY ANGIOGRAPHY N/A 08/29/2019   Procedure: LEFT HEART CATH AND CORONARY ANGIOGRAPHY;  Surgeon: Charolette Forward, MD;  Location: [facility];  Service: Cardiovascular;  Laterality: N/A;   LUMBAR WOUND DEBRIDEMENT N/A 04/03/2019   Procedure: LUMBAR WOUND WASHOUT;  Surgeon:  Judith Part, MD;  Location: [facility];  Service: Neurosurgery;  Laterality: N/A;   POSTERIOR LUMBAR FUSION  03/04/2019   surgery for endometriosis  yrs ago   thryoid biopsy  December 01, 2011    at [facility]    Prior to Admission medications   Medication Sig Start Date End Date Taking? Authorizing Provider  albuterol (VENTOLIN HFA) 108 (90 Base) MCG/ACT inhaler Inhale 1-2 puffs into the lungs every 6 (six) hours as needed for wheezing or shortness of breath.   Yes [provider]  aspirin EC 81 MG tablet Take 81 mg by mouth daily.   Yes [provider]  D3-50 1.25 MG (50000 UT) capsule Take 50,000 Units by mouth 3 (three) times a week. 05/06/20  Yes [provider]  diclofenac Sodium (VOLTAREN) 1 % GEL Apply 4 g topically 2 (two) times daily as needed (back pain).   Yes [provider]  DULoxetine (CYMBALTA) 60 MG capsule Take 60 mg by mouth daily. 08/11/19  Yes [provider]  escitalopram (LEXAPRO) 10 MG tablet Take 10 mg by mouth at bedtime. 05/04/20  Yes [provider]  ferrous sulfate 325 (65 FE) MG EC tablet Take 325 mg by mouth daily. 07/26/19  Yes [provider]  fluticasone (FLONASE) 50 MCG/ACT nasal spray Place 1 spray into both nostrils daily as needed for allergies.  11/08/17  Yes [provider]  folic acid (FOLVITE) 1 MG tablet Take 1 mg by mouth daily. 10/14/17  Yes [provider]  HYDROcodone-acetaminophen (NORCO) 10-325 MG tablet Take 1 tablet by mouth 2 (two) times daily as needed for moderate pain or severe pain. 02/16/20  Yes [provider]  isosorbide mononitrate (IMDUR) 30 MG 24 hr tablet Take 1 tablet (30 mg total) by mouth daily. 11/18/19  Yes Corena Herter, PA-C  loratadine (CLARITIN) 10 MG tablet Take 10 mg by mouth daily.    Yes [provider]  losartan-hydrochlorothiazide (HYZAAR) 100-12.5 MG tablet Take 1 tablet by mouth daily. 01/13/20  Yes [provider]   Magnesium Oxide 400 MG CAPS Take 1 capsule (400 mg total) by mouth daily. 09/28/19  Yes [place] Cater, PA-C  metoprolol succinate (TOPROL-XL) 100 MG 24 hr tablet Take 100 mg by mouth at bedtime.  07/10/19  Yes [provider]  mirtazapine (REMERON) 7.5 MG tablet Take 7.5 mg by mouth at bedtime. 07/30/20  Yes [provider]  morphine (MS CONTIN) 30 MG 12 hr tablet Take 30 mg by mouth every 12 (twelve) hours. 06/14/20  Yes [provider]  nitroGLYCERIN (NITROSTAT) 0.4 MG SL tablet Place 1 tablet (0.4 mg total) under the tongue every 5 (five) minutes x 3 doses as needed for chest pain. Patient taking differently: Place 0.4 mg under the tongue every 5 (five) minutes as needed for chest pain. 03/13/14  Yes Charolette Forward, MD  ondansetron (ZOFRAN-ODT) 4 MG disintegrating tablet Take 4  mg by mouth every 8 (eight) hours as needed for nausea/vomiting. 06/07/20  Yes [provider]  pantoprazole (PROTONIX) 40 MG tablet Take 1 tablet (40 mg total) by mouth 2 (two) times daily. 03/15/19  Yes Angiulli, Lavon Paganini, PA-C  polyethylene glycol (MIRALAX / GLYCOLAX) 17 g packet Take 17 g by mouth daily as needed for mild constipation. 03/08/19  Yes Viona Gilmore D, NP  potassium chloride SA (KLOR-CON) 20 MEQ tablet Take 1 tablet (20 mEq total) by mouth 2 (two) times daily. 09/28/19  Yes [place] Cater, PA-C  pregabalin (LYRICA) 75 MG capsule Take 1 capsule (75 mg total) by mouth 3 (three) times daily. 03/15/19  Yes Angiulli, Lavon Paganini, PA-C  spironolactone (ALDACTONE) 25 MG tablet Take 25 mg by mouth daily.   Yes [provider]  metoprolol (LOPRESSOR) 50 MG tablet Take 50 mg by mouth 2 (two) times daily.    12/04/11  [provider]  simvastatin (ZOCOR) 20 MG tablet Take 20 mg by mouth at bedtime.    12/04/11  [provider]    Current Facility-Administered Medications  Medication Dose Route Frequency Provider Last Rate Last Admin    stroke: mapping our early  stages of recovery book   Does not apply Once Karmen Bongo, MD       (feeding supplement) PROSource Plus liquid 30 mL  30 mL Oral BID BM Karmen Bongo, MD   30 mL at 03/18/21 0957   acetaminophen (TYLENOL) tablet 650 mg  650 mg Oral Q4H PRN Karmen Bongo, MD   650 mg at 03/17/21 1000   Or   acetaminophen (TYLENOL) 160 MG/5ML solution 650 mg  650 mg Per Tube Q4H PRN Karmen Bongo, MD       Or   acetaminophen (TYLENOL) suppository 650 mg  650 mg Rectal Q4H PRN Karmen Bongo, MD       albuterol (PROVENTIL) (2.5 MG/3ML) 0.083% nebulizer solution 2.5 mg  2.5 mg Inhalation Q6H PRN Jennye Boroughs, MD       aspirin EC tablet 81 mg  81 mg Oral Daily Jennye Boroughs, MD   81 mg at 03/18/21 0955   clopidogrel (PLAVIX) tablet 75 mg  75 mg Oral Daily Kerney Elbe, MD   75 mg at 03/18/21 8527   diclofenac Sodium (VOLTAREN) 1 % topical gel 4 g  4 g Topical BID PRN Jennye Boroughs, MD   4 g at 03/17/21 0758   DULoxetine (CYMBALTA) DR capsule 60 mg  60 mg Oral Daily Jennye Boroughs, MD   60 mg at 03/18/21 0954   enoxaparin (LOVENOX) injection 40 mg  40 mg Subcutaneous Q24H Karmen Bongo, MD   40 mg at 03/18/21 2235   escitalopram (LEXAPRO) tablet 10 mg  10 mg Oral QHS Jennye Boroughs, MD   10 mg at 03/18/21 2235   feeding supplement (ENSURE ENLIVE / ENSURE PLUS) liquid 237 mL  237 mL Oral BID BM Jennye Boroughs, MD   237 mL at 03/18/21 1506   fluticasone (FLONASE) 50 MCG/ACT nasal spray 1 spray  1 spray Each Nare Daily PRN Jennye Boroughs, MD       haloperidol lactate (HALDOL) injection 5 mg  5 mg Intramuscular Q6H PRN Jennye Boroughs, MD       HYDROcodone-acetaminophen (NORCO) 10-325 MG per tablet 1 tablet  1 tablet Oral BID PRN Jennye Boroughs, MD   1 tablet at 03/18/21 2022   isosorbide mononitrate (IMDUR) 24 hr tablet 30 mg  30 mg Oral Daily Jennye Boroughs, MD  30 mg at 03/18/21 0955   lidocaine (LIDODERM) 5 % 1 patch  1 patch Transdermal Q24H Shela Leff, MD   1 patch at 03/18/21 2235    loratadine (CLARITIN) tablet 10 mg  10 mg Oral Daily Jennye Boroughs, MD   10 mg at 03/18/21 0938   metoprolol succinate (TOPROL-XL) 24 hr tablet 100 mg  100 mg Oral QHS Opyd, Ilene Qua, MD   100 mg at 03/18/21 2238   mirtazapine (REMERON) tablet 7.5 mg  7.5 mg Oral QHS Jennye Boroughs, MD   7.5 mg at 03/18/21 2234   morphine (MS CONTIN) 12 hr tablet 30 mg  30 mg Oral Q12H Jennye Boroughs, MD   30 mg at 03/18/21 2234   ondansetron [facility]) injection 4 mg  4 mg Intravenous Q6H PRN Jennye Boroughs, MD   4 mg at 03/14/21 0811   pantoprazole (PROTONIX) EC tablet 40 mg  40 mg Oral BID Jennye Boroughs, MD   40 mg at 03/18/21 2235   pregabalin (LYRICA) capsule 75 mg  75 mg Oral TID Jennye Boroughs, MD   75 mg at 03/18/21 2234   protein supplement (ENSURE MAX) liquid  11 oz Oral BID Karmen Bongo, MD   11 oz at 03/18/21 2246   senna-docusate (Senokot-S) tablet 1 tablet  1 tablet Oral QHS PRN Karmen Bongo, MD   1 tablet at 03/13/21 1840   spironolactone (ALDACTONE) tablet 25 mg  25 mg Oral Daily Jennye Boroughs, MD   25 mg at 03/18/21 1829   vitamin B-12 (CYANOCOBALAMIN) tablet 2,000 mcg  2,000 mcg Oral Daily Kerney Elbe, MD   2,000 mcg at 03/18/21 0957    Allergies as of 03/12/2021 - Review Complete 03/12/2021  Allergen Reaction Noted   Shrimp [shellfish allergy] Anaphylaxis 01/01/2013   Naproxen Other (See Comments) 01/01/2007    Family History  Problem Relation Age of Onset   Diabetes Mother    Hypertension Mother    Transient ischemic attack Mother    Seizures Mother    Dementia Mother    Other Father        MVA   Cancer Brother        colon and lung   Cancer Maternal Grandmother        colon    Social History   Socioeconomic History   Marital status: Married    Spouse name: Not on file   Number of children: 2   Years of education: 14   Highest education level: Not on file  Occupational History   Occupation: Unemployed  Tobacco Use   Smoking status: Never   Smokeless tobacco:  Never  Vaping Use   Vaping Use: Never used  Substance and Sexual Activity   Alcohol use: Not Currently    Alcohol/week: 0.0 standard drinks   Drug use: No   Sexual activity: Not Currently  Other Topics Concern   Not on file  Social History Narrative   Lives at home with family.   Right-handed.   No caffeine use.   Social Determinants of Health   Financial Resource Strain: Not on file  Food Insecurity: Not on file  Transportation Needs: Not on file  Physical Activity: Not on file  Stress: Not on file  Social Connections: Not on file  Intimate Partner Violence: Not on file    Review of Systems: Pertinent positive and negative review of systems were noted in the above HPI section.  All other review of systems was otherwise negative.   Physical Exam:  Vital signs in last 24 hours: Temp:  [97.9 F (36.6 C)-98.3 F (36.8 C)] 97.9 F (36.6 C) (06/21 0822) Pulse Rate:  [69-97] 77 (06/21 0822) Resp:  [18] 18 (06/21 0822) BP: (118-157)/(82-104) 140/83 (06/21 0822) SpO2:  [99 %-100 %] 100 % (06/21 0822) Last BM Date: 03/18/21 General:   Alert,  Well-developed, well-nourished, thin African-American female pleasant and cooperative in NAD, sitting up on the side of the bed Head:  Normocephalic and atraumatic. Eyes:  Sclera clear, no icterus.   Conjunctiva pink. Ears:  Normal auditory acuity. Nose:  No deformity, discharge,  or lesions. Mouth:  No deformity or lesions.   Neck:  Supple; no masses or thyromegaly. Lungs:  Clear throughout to auscultation.   No wheezes, crackles, or rhonchi.  Heart:  Regular rate and rhythm; no murmurs, clicks, rubs,  or gallops. Abdomen:  Soft,nontender, midline incisional scar BS active,nonpalp mass or hsm.   Rectal: Not done Msk:  Symmetrical without gross deformities. . Pulses:  Normal pulses noted. Extremities:  Without clubbing or edema. Neurologic:  Alert and  oriented x4;  grossly normal neurologically. Skin:  Intact without significant  lesions or rashes.. Psych:  Alert and cooperative. Normal mood and affect.  Intake/Output from previous day: No intake/output data recorded. Intake/Output this shift: No intake/output data recorded.  Lab Results: Recent Labs    03/18/21 0349  WBC 11.5*  HGB 11.4*  HCT 35.2*  PLT 155   BMET Recent Labs    03/18/21 0349  NA 143  K 4.0  CL 111  CO2 29  GLUCOSE 87  BUN 25*  CREATININE 0.83  CALCIUM 8.0*   LFT No results for input(s): PROT, ALBUMIN, AST, ALT, ALKPHOS, BILITOT, BILIDIR, IBILI in the last 72 hours. PT/INR No results for input(s): LABPROT, INR in the last 72 hours. Hepatitis Panel No results for input(s): HEPBSAG, HCVAB, HEPAIGM, HEPBIGM in the last 72 hours.     IMPRESSION:   #53 60 year old female with chronic dysphagia to solids over the past 2 to 3 years.  Prior imaging most recently 2018 showed no evidence of stricture by barium swallow. Suspect she may have chronic dysmotility. Does have history of chronic GERD, maintained on PPI therapy and therefore cannot rule out development of esophageal stricture.  #2 acute TIA #3 prior history of CVA #4 antiplatelet therapy-aspirin and Plavix  #5 2D echo with questionable mobile density on the ventral ventricular side of the aortic valve leaflet TEE pending  #6 status post Roux-en-Y gastric bypass #7 osteoarthritis #8.  Fibromyalgia #9.  Chronic pain syndrome 1#0.  Hypertension   PLAN: Patient is actually established with Dr. Hung/gastroenterology  Await results of barium swallow today-EGD with dilation of stricture cannot be done this admission however she would need to come off Plavix prior to procedure.  Dr. Benson Norway will assume GI care.     Amy Esterwood  PA-C6/21/2022, 9:51 AM  I have seen and evaluated the patient as well and looked at the barium swallow results. Barium swallow does not show a stricture some esophageal dysmotility.  Cleared to go ahead with TEE.  Let us or Dr. Benson Norway know if  there are other issues.  Gatha Mayer, MD, [facility] 03/19/2021 4:01 PM

## 2021-03-19 NOTE — Progress Notes (Signed)
Patient refused CPAP for the night  

## 2021-03-19 NOTE — Plan of Care (Signed)
  Problem: Health Behavior/Discharge Planning: Goal: Ability to manage health-related needs will improve Outcome: Progressing   Problem: Clinical Measurements: Goal: Ability to maintain clinical measurements within normal limits will improve Outcome: Progressing Goal: Diagnostic test results will improve Outcome: Progressing Goal: Cardiovascular complication will be avoided Outcome: Progressing   Problem: Activity: Goal: Risk for activity intolerance will decrease Outcome: Progressing

## 2021-03-19 NOTE — Progress Notes (Signed)
PROGRESS NOTE    Jodi Bowman  BPZ:[id number] DOB: 03/24/1961 DOA: 03/12/2021 PCP: Aletha Halim., PA-C    Brief Narrative:  Jodi Bowman is a 60 year old female with past medical history significant for fibromyalgia, morbid obesity s/p gastric bypass (2017), hyperlipidemia, essential hypertension, OSA, CVA in 2000 and who presented to [facility] ED on 6/14 with weakness, facial droop.  Husband reports that he works nights and came home about 11:45 AM and she was in the kitchen making lunch, he heard her fall to the floor and noticed a facial droop.  She was able to eat a protein shake without difficulty.  Patient was notably somnolent in the ER, and currently took some "morphine", which she is supposed to be weaning off of her opiate painkillers since her spinal cord stimulator was placed.  Patient with similar episode in November last year which she took too many pills, but does not think that this is the case this time.  Patient does not recall falling, and was reportedly having hallucinations in the ED.  Code stroke was initiated in the ED.  CT head, MRI/CT angiogram negative.  Hospital service consulted for further evaluation and management.   Assessment & Plan:   Principal Problem:   Acute metabolic encephalopathy Active Problems:   Essential hypertension   Chronic pain syndrome   Mood disorder (HCC)   Metabolic encephalopathy   Acute metabolic encephalopathy: Resolved TIA Patient presenting to the ED following fall at home, family noted weakness and facial droop.  Neurology was consulted and followed during hospital course.  EtOH level less than 10.  UDS positive for opiates.  Urinalysis unrevealing.  CT head without contrast with no evidence of acute intracranial abnormality, mild chronic microvascular ischemic disease noted.  MRI brain without contrast with no evidence of acute infarction.  CT angio head/neck with no large vessel occlusion, no hemodynamically  significant stenosis or evidence of dissection; no evidence of cord infarction or penumbra.  TTE with LVEF 24-23%, grade 1 diastolic dysfunction, no MR, no aortic stenosis, but note a small hazy mobile density ventricular side of AV leaflets, artifact versus mass. --Aspirin 81 mg p.o. daily --Plavix 75 mg p.o. daily --Outpatient follow-up with neurology  Aortic valve mass vs clot vs artifact TTE with findings of small hazy mobile density ventricular side of AV leaflets, suspicious for artifact versus mass. --If TEE confirms TTE finding, will reach back out to neuro hospitalist team --Cardiology awaiting GI evaluation for history of dysphagia/esophageal stricture in order to perform TEE  Dysphagia Reported history of esophageal stricture Patient reports twice weekly issues with swallowing solid foods for many years, denies any issues with liquids.  Patient reports strong family history of esophageal strictures requiring dilation, patient denies any history of previous dilation attempts to herself. --Gastroenterology consulted, appreciate assistance --Ordered barium swallow for further evaluation today  Essential hypertension BP 128/84.  Home antihypertensive regimen includes isosorbide mononitrate 30 mg p.o. daily, losartan-HCTZ 100-12.5 mg p.o. daily, metoprolol succinate 100 mg p.o. nightly, spironolactone 25 mg p.o. daily. --Isosorbide mononitrate 30 mg p.o. daily --Metoprolol succinate 100 mg p.o. nightly --Spironolactone 25 mg p.o. daily --Hold home losartan-HCTZ --Continue monitor BP  Chronic pain syndrome Has spinal cord stimulator in place. --MS Contin 30 mg p.o. twice daily --Norco 10-325 mg p.o. twice daily as needed --Lidoderm patch --Voltaren gel --Cymbalta 60 mg p.o. daily --Lyrica 75 mg p.o. 3 times daily  Mood disorder --Remeron 7.5 mg p.o. daily --Lexapro 10 mg p.o. daily  GERD:  Continue PPI twice daily  Weakness/debility/deconditioning: --PT recommends home  health on discharge --Continue therapy efforts while inpatient   DVT prophylaxis: enoxaparin (LOVENOX) injection 40 mg Start: 03/12/21 2200    Code Status: Full Code Family Communication: No family present at bedside this morning, updated patient's husband, Gwyndolyn Saxon via telephone this afternoon.  Disposition Plan:  Level of care: Med-Surg Status is: Inpatient  Remains inpatient appropriate because:Ongoing diagnostic testing needed not appropriate for outpatient work up and Unsafe d/c plan  Dispo: The patient is from: Home              Anticipated d/c is to: Home              Patient currently is not medically stable to d/c.   Difficult to place patient No    Consultants:  Neurology  Procedures:  TTE TEE: Pending Barium swallow: Pending  Antimicrobials:  None   Subjective: Patient seen examined at bedside, resting comfortably.  No significant complaints this morning.  Awaiting for GI to evaluate for history of esophageal stricture, as cardiology needs this information in order to proceed with TEE.  Patient with no other questions or concerns at this time.  Updated patient's husband via telephone this afternoon. Denies headache, no visual changes, no chest pain, no palpitations, no shortness of breath, no abdominal pain, no weakness, no fatigue, no paresthesias.  No acute events overnight per nursing staff.  Objective: Vitals:   03/18/21 2238 03/18/21 2259 03/19/21 0403 03/19/21 0822  BP: (!) 125/104 (!) 150/82 128/84 140/83  Pulse: 85 69 86 77  Resp:  18 18 18   Temp:  98.1 F (36.7 C) 98.3 F (36.8 C) 97.9 F (36.6 C)  TempSrc:  Oral Oral Oral  SpO2:  100% 99% 100%  Weight:      Height:       No intake or output data in the 24 hours ending 03/19/21 1226  Filed Weights   03/12/21 1411 03/18/21 0416  Weight: 64 kg 61.2 kg    Examination:  General exam: Appears calm and comfortable  Respiratory system: Clear to auscultation. Respiratory effort normal.  On room  air Cardiovascular system: S1 & S2 heard, RRR. No JVD, murmurs, rubs, gallops or clicks. No pedal edema. Gastrointestinal system: Abdomen is nondistended, soft and nontender. No organomegaly or masses felt. Normal bowel sounds heard. Central nervous system: Alert and oriented. No focal neurological deficits. Extremities: Symmetric 5 x 5 power. Skin: No rashes, lesions or ulcers Psychiatry: Judgement and insight appear normal. Mood & affect appropriate.     Data Reviewed: I have personally reviewed following labs and imaging studies  CBC: Recent Labs  Lab 03/12/21 1331 03/12/21 1338 03/14/21 0215 03/18/21 0349  WBC 10.8*  --  9.3 11.5*  NEUTROABS 6.6  --  5.1  --   HGB 11.9* 13.6 10.7* 11.4*  HCT 37.5 40.0 31.5* 35.2*  MCV 96.4  --  91.3 96.7  PLT 178  --  154 938   Basic Metabolic Panel: Recent Labs  Lab 03/12/21 1331 03/12/21 1338 03/12/21 1932 03/13/21 0953 03/14/21 0215 03/18/21 0349  NA 139 141  --  141 142 143  K 3.2* 3.2*  --  3.0* 3.9 4.0  CL 105 106  --  108 108 111  CO2 28  --   --  27 29 29   GLUCOSE 84 77  --  84 70 87  BUN 19 20  --  19 17 25*  CREATININE 1.10* 1.10*  --  0.89 0.75 0.83  CALCIUM 8.4*  --   --  8.2* 8.0* 8.0*  MG  --   --  1.6* 1.4* 1.8  --   PHOS  --   --  3.5 3.3  --   --    GFR: Estimated Creatinine Clearance: 56.6 mL/min (by C-G formula based on SCr of 0.83 mg/dL). Liver Function Tests: Recent Labs  Lab 03/12/21 1331  AST 36  ALT 29  ALKPHOS 72  BILITOT 0.6  PROT 5.5*  ALBUMIN 2.5*   No results for input(s): LIPASE, AMYLASE in the last 168 hours. No results for input(s): AMMONIA in the last 168 hours. Coagulation Profile: Recent Labs  Lab 03/12/21 1331  INR 1.1   Cardiac Enzymes: No results for input(s): CKTOTAL, CKMB, CKMBINDEX, TROPONINI in the last 168 hours. BNP (last 3 results) No results for input(s): PROBNP in the last 8760 hours. HbA1C: No results for input(s): HGBA1C in the last 72 hours. CBG: Recent  Labs  Lab 03/12/21 1324  GLUCAP 93   Lipid Profile: No results for input(s): CHOL, HDL, LDLCALC, TRIG, CHOLHDL, LDLDIRECT in the last 72 hours. Thyroid Function Tests: No results for input(s): TSH, T4TOTAL, FREET4, T3FREE, THYROIDAB in the last 72 hours. Anemia Panel: No results for input(s): VITAMINB12, FOLATE, FERRITIN, TIBC, IRON, RETICCTPCT in the last 72 hours. Sepsis Labs: No results for input(s): PROCALCITON, LATICACIDVEN in the last 168 hours.  Recent Results (from the past 240 hour(s))  Resp Panel by RT-PCR (Flu A&B, Covid) Nasopharyngeal Swab     Status: None   Collection Time: 03/12/21  1:31 PM   Specimen: Nasopharyngeal Swab; Nasopharyngeal(NP) swabs in vial transport medium  Result Value Ref Range Status   SARS Coronavirus 2 by RT PCR NEGATIVE NEGATIVE Final    Comment: (NOTE) SARS-CoV-2 target nucleic acids are NOT DETECTED.  The SARS-CoV-2 RNA is generally detectable in upper respiratory specimens during the acute phase of infection. The lowest concentration of SARS-CoV-2 viral copies this assay can detect is 138 copies/mL. A negative result does not preclude SARS-Cov-2 infection and should not be used as the sole basis for treatment or other patient management decisions. A negative result may occur with  improper specimen collection/handling, submission of specimen other than nasopharyngeal swab, presence of viral mutation(s) within the areas targeted by this assay, and inadequate number of viral copies(<138 copies/mL). A negative result must be combined with clinical observations, patient history, and epidemiological information. The expected result is Negative.  Fact Sheet for Patients:  EntrepreneurPulse.com.au  Fact Sheet for Healthcare Providers:  IncredibleEmployment.be  This test is no t yet approved or cleared by the [place] FDA and  has been authorized for detection and/or diagnosis of SARS-CoV-2 by FDA  under an Emergency Use Authorization (EUA). This EUA will remain  in effect (meaning this test can be used) for the duration of the COVID-19 declaration under Section 564(b)(1) of the Act, 21 U.S.C.section 360bbb-3(b)(1), unless the authorization is terminated  or revoked sooner.       Influenza A by PCR NEGATIVE NEGATIVE Final   Influenza B by PCR NEGATIVE NEGATIVE Final    Comment: (NOTE) The Xpert Xpress SARS-CoV-2/FLU/RSV plus assay is intended as an aid in the diagnosis of influenza from Nasopharyngeal swab specimens and should not be used as a sole basis for treatment. Nasal washings and aspirates are unacceptable for Xpert Xpress SARS-CoV-2/FLU/RSV testing.  Fact Sheet for Patients: EntrepreneurPulse.com.au  Fact Sheet for Healthcare Providers: IncredibleEmployment.be  This test is not yet approved  or cleared by the [place] and has been authorized for detection and/or diagnosis of SARS-CoV-2 by FDA under an Emergency Use Authorization (EUA). This EUA will remain in effect (meaning this test can be used) for the duration of the COVID-19 declaration under Section 564(b)(1) of the Act, 21 U.S.C. section 360bbb-3(b)(1), unless the authorization is terminated or revoked.  Performed at [facility], [place] [address] [place], [place] [address]          Radiology Studies: No results found.      Scheduled Meds:   stroke: mapping our early stages of recovery book   Does not apply Once   (feeding supplement) PROSource Plus  30 mL Oral BID BM   aspirin EC  81 mg Oral Daily   clopidogrel  75 mg Oral Daily   DULoxetine  60 mg Oral Daily   enoxaparin (LOVENOX) injection  40 mg Subcutaneous Q24H   escitalopram  10 mg Oral QHS   feeding supplement  237 mL Oral BID BM   isosorbide mononitrate  30 mg Oral Daily   lidocaine  1 patch Transdermal Q24H   loratadine  10 mg Oral Daily   metoprolol succinate  100 mg Oral QHS    mirtazapine  7.5 mg Oral QHS   morphine  30 mg Oral Q12H   pantoprazole  40 mg Oral BID   pregabalin  75 mg Oral TID   Ensure Max Protein  11 oz Oral BID   spironolactone  25 mg Oral Daily   vitamin B-12  2,000 mcg Oral Daily   Continuous Infusions:   LOS: 2 days    Time spent: 40 minutes spent on chart review, discussion with nursing staff, consultants, updating family and interview/physical exam; more than 50% of that time was spent in counseling and/or coordination of care.    Gaynelle Pastrana J [place] DO [facility] Available via Epic secure chat 7am-7pm After these hours, please refer to coverage provider listed on [email or web address] 03/19/2021, 12:26 PM

## 2021-03-19 NOTE — Progress Notes (Signed)
SLP Cancellation Note  Patient Details Name: ELLINA SIVERTSEN MRN: [id number] DOB: 1960/10/03   Cancelled treatment:     Orders received for bedside swallow evaluation.  GI work-up is pending for assessment of esophageal stricture in order to proceed with TEE>  Will await results of  GI exam to determine if an oropharyngeal swallow eval is warranted. D/W Dr. [place] and RN.  Ilija Maxim L. Tivis Ringer, [place] CCC/SLP Acute Rehabilitation Services Office number [phone] Pager [phone]       Juan Quam Laurice 03/19/2021, 10:04 AM

## 2021-03-20 NOTE — Progress Notes (Addendum)
SLP spoke to RN, Ty, re: pt's potential esophageal dysphagia.  RN reports GI followed up with pt and advised pt to results of her testing.  No SLP intervention warranted at this time.  Thanks.    Kathleen Lime, MS [facility] SLP Acute Rehab Services Office [phone] Pager [phone]

## 2021-03-20 NOTE — Progress Notes (Signed)
PROGRESS NOTE    Jodi Bowman  SWN:[id number] DOB: [birth year] DOA: 03/12/2021 PCP: Aletha Halim., PA-C    Brief Narrative:  Jodi Bowman is a 60 year old female with past medical history significant for fibromyalgia, morbid obesity s/p gastric bypass (2017), hyperlipidemia, essential hypertension, OSA, CVA in 2000 and who presented to [facility] ED on 6/14 with weakness, facial droop.  Husband reports that he works nights and came home about 11:45 AM and she was in the kitchen making lunch, he heard her fall to the floor and noticed a facial droop.  She was able to eat a protein shake without difficulty.  Patient was notably somnolent in the ER, and currently took some "morphine", which she is supposed to be weaning off of her opiate painkillers since her spinal cord stimulator was placed.  Patient with similar episode in November last year which she took too many pills, but does not think that this is the case this time.  Patient does not recall falling, and was reportedly having hallucinations in the ED.  Code stroke was initiated in the ED.  CT head, MRI/CT angiogram negative.  Hospital service consulted for further evaluation and management.   Assessment & Plan:   Principal Problem:   Acute metabolic encephalopathy Active Problems:   Essential hypertension   Chronic pain syndrome   Mood disorder (HCC)   Metabolic encephalopathy   Dysphagia   Acute metabolic encephalopathy: Resolved TIA Patient presenting to the ED following fall at home, family noted weakness and facial droop.  Neurology was consulted and followed during hospital course.  EtOH level less than 10.  UDS positive for opiates.  Urinalysis unrevealing.  CT head without contrast with no evidence of acute intracranial abnormality, mild chronic microvascular ischemic disease noted.  MRI brain without contrast with no evidence of acute infarction.  CT angio head/neck with no large vessel occlusion, no  hemodynamically significant stenosis or evidence of dissection; no evidence of cord infarction or penumbra.  TTE with LVEF 93-81%, grade 1 diastolic dysfunction, no MR, no aortic stenosis, but note a small hazy mobile density ventricular side of AV leaflets, artifact versus mass. --Aspirin 81 mg p.o. daily --Plavix 75 mg p.o. daily --Outpatient follow-up with neurology  Aortic valve mass vs clot vs artifact TTE with findings of small hazy mobile density ventricular side of AV leaflets, suspicious for artifact versus mass. --TEE pending on 03/22/2021 --If TEE confirms TTE finding, will reach back out to neuro hospitalist team  Dysphagia Reported history of esophageal stricture Patient reports twice weekly issues with swallowing solid foods for many years, denies any issues with liquids.  Patient reports strong family history of esophageal strictures requiring dilation, patient denies any history of previous dilation attempts to herself.  Barium swallow with no sign of esophageal stricture, mild esophageal dysmotility.  Seen by GI with no further recommendations; and cleared for TEE.  Essential hypertension BP 110/69.  Home antihypertensive regimen includes isosorbide mononitrate 30 mg p.o. daily, losartan-HCTZ 100-12.5 mg p.o. daily, metoprolol succinate 100 mg p.o. nightly, spironolactone 25 mg p.o. daily. --Isosorbide mononitrate 30 mg p.o. daily --Metoprolol succinate 100 mg p.o. nightly --Spironolactone 25 mg p.o. daily --Continue to hold home losartan-HCTZ --Continue monitor BP  Chronic pain syndrome Has spinal cord stimulator in place. --MS Contin 30 mg p.o. twice daily --Norco 10-325 mg p.o. twice daily as needed --Lidoderm patch --Voltaren gel --Cymbalta 60 mg p.o. daily --Lyrica 75 mg p.o. 3 times daily  Mood disorder --Remeron 7.5 mg p.o.  daily --Lexapro 10 mg p.o. daily  GERD: Continue PPI twice daily  Weakness/debility/deconditioning: --PT recommends home health on  discharge --Continue therapy efforts while inpatient   DVT prophylaxis: enoxaparin (LOVENOX) injection 40 mg Start: 03/12/21 2200    Code Status: Full Code Family Communication: No family present at bedside this morning, updated patient's husband, Jodi Bowman via telephone yesterday afternoon.  Disposition Plan:  Level of care: Med-Surg Status is: Inpatient  Remains inpatient appropriate because:Ongoing diagnostic testing needed not appropriate for outpatient work up and Unsafe d/c plan  Dispo: The patient is from: Home              Anticipated d/c is to: Home              Patient currently is not medically stable to d/c.   Difficult to place patient No    Consultants:  Neurology  Procedures:  TTE TEE: Pending Barium swallow: No stricture noted  Antimicrobials:  None   Subjective: Patient seen examined at bedside, resting comfortably.  No significant complaints this morning.  Cleared by GI yesterday after no stricture noted on barium swallow.  Pending TEE on Friday. Denies headache, no visual changes, no chest pain, no palpitations, no shortness of breath, no abdominal pain, no weakness, no fatigue, no paresthesias.  No acute events overnight per nursing staff.  Objective: Vitals:   03/20/21 0303 03/20/21 0349 03/20/21 0730 03/20/21 1141  BP:  110/69 107/69 107/66  Pulse:  73 67 82  Resp:  18 18 16   Temp:  98.2 F (36.8 C) 98 F (36.7 C) 98.6 F (37 C)  TempSrc: Oral Oral Oral Oral  SpO2:  100% 100% 100%  Weight:      Height:        Intake/Output Summary (Last 24 hours) at 03/20/2021 1305 Last data filed at 03/19/2021 1900 Gross per 24 hour  Intake 560 ml  Output --  Net 560 ml    Filed Weights   03/12/21 1411 03/18/21 0416  Weight: 64 kg 61.2 kg    Examination:  General exam: Appears calm and comfortable  Respiratory system: Clear to auscultation. Respiratory effort normal.  On room air Cardiovascular system: S1 & S2 heard, RRR. No JVD, murmurs, rubs,  gallops or clicks. No pedal edema. Gastrointestinal system: Abdomen is nondistended, soft and nontender. No organomegaly or masses felt. Normal bowel sounds heard. Central nervous system: Alert and oriented. No focal neurological deficits. Extremities: Symmetric 5 x 5 power. Skin: No rashes, lesions or ulcers Psychiatry: Judgement and insight appear normal. Mood & affect appropriate.     Data Reviewed: I have personally reviewed following labs and imaging studies  CBC: Recent Labs  Lab 03/14/21 0215 03/18/21 0349  WBC 9.3 11.5*  NEUTROABS 5.1  --   HGB 10.7* 11.4*  HCT 31.5* 35.2*  MCV 91.3 96.7  PLT 154 878   Basic Metabolic Panel: Recent Labs  Lab 03/14/21 0215 03/18/21 0349  NA 142 143  K 3.9 4.0  CL 108 111  CO2 29 29  GLUCOSE 70 87  BUN 17 25*  CREATININE 0.75 0.83  CALCIUM 8.0* 8.0*  MG 1.8  --    GFR: Estimated Creatinine Clearance: 56.6 mL/min (by C-G formula based on SCr of 0.83 mg/dL). Liver Function Tests: No results for input(s): AST, ALT, ALKPHOS, BILITOT, PROT, ALBUMIN in the last 168 hours.  No results for input(s): LIPASE, AMYLASE in the last 168 hours. No results for input(s): AMMONIA in the last 168 hours. Coagulation Profile:  No results for input(s): INR, PROTIME in the last 168 hours.  Cardiac Enzymes: No results for input(s): CKTOTAL, CKMB, CKMBINDEX, TROPONINI in the last 168 hours. BNP (last 3 results) No results for input(s): PROBNP in the last 8760 hours. HbA1C: No results for input(s): HGBA1C in the last 72 hours. CBG: No results for input(s): GLUCAP in the last 168 hours.  Lipid Profile: No results for input(s): CHOL, HDL, LDLCALC, TRIG, CHOLHDL, LDLDIRECT in the last 72 hours. Thyroid Function Tests: No results for input(s): TSH, T4TOTAL, FREET4, T3FREE, THYROIDAB in the last 72 hours. Anemia Panel: No results for input(s): VITAMINB12, FOLATE, FERRITIN, TIBC, IRON, RETICCTPCT in the last 72 hours. Sepsis Labs: No results for  input(s): PROCALCITON, LATICACIDVEN in the last 168 hours.  Recent Results (from the past 240 hour(s))  Resp Panel by RT-PCR (Flu A&B, Covid) Nasopharyngeal Swab     Status: None   Collection Time: 03/12/21  1:31 PM   Specimen: Nasopharyngeal Swab; Nasopharyngeal(NP) swabs in vial transport medium  Result Value Ref Range Status   SARS Coronavirus 2 by RT PCR NEGATIVE NEGATIVE Final    Comment: (NOTE) SARS-CoV-2 target nucleic acids are NOT DETECTED.  The SARS-CoV-2 RNA is generally detectable in upper respiratory specimens during the acute phase of infection. The lowest concentration of SARS-CoV-2 viral copies this assay can detect is 138 copies/mL. A negative result does not preclude SARS-Cov-2 infection and should not be used as the sole basis for treatment or other patient management decisions. A negative result may occur with  improper specimen collection/handling, submission of specimen other than nasopharyngeal swab, presence of viral mutation(s) within the areas targeted by this assay, and inadequate number of viral copies(<138 copies/mL). A negative result must be combined with clinical observations, patient history, and epidemiological information. The expected result is Negative.  Fact Sheet for Patients:  EntrepreneurPulse.com.au  Fact Sheet for Healthcare Providers:  IncredibleEmployment.be  This test is no t yet approved or cleared by the [place] FDA and  has been authorized for detection and/or diagnosis of SARS-CoV-2 by FDA under an Emergency Use Authorization (EUA). This EUA will remain  in effect (meaning this test can be used) for the duration of the COVID-19 declaration under Section 564(b)(1) of the Act, 21 U.S.C.section 360bbb-3(b)(1), unless the authorization is terminated  or revoked sooner.       Influenza A by PCR NEGATIVE NEGATIVE Final   Influenza B by PCR NEGATIVE NEGATIVE Final    Comment: (NOTE) The  Xpert Xpress SARS-CoV-2/FLU/RSV plus assay is intended as an aid in the diagnosis of influenza from Nasopharyngeal swab specimens and should not be used as a sole basis for treatment. Nasal washings and aspirates are unacceptable for Xpert Xpress SARS-CoV-2/FLU/RSV testing.  Fact Sheet for Patients: EntrepreneurPulse.com.au  Fact Sheet for Healthcare Providers: IncredibleEmployment.be  This test is not yet approved or cleared by the [place] FDA and has been authorized for detection and/or diagnosis of SARS-CoV-2 by FDA under an Emergency Use Authorization (EUA). This EUA will remain in effect (meaning this test can be used) for the duration of the COVID-19 declaration under Section 564(b)(1) of the Act, 21 U.S.C. section 360bbb-3(b)(1), unless the authorization is terminated or revoked.  Performed at [facility], [place] [address] [place], [place] [address]          Radiology Studies: DG ESOPHAGUS W SINGLE CM (SOL OR THIN BA)  Result Date: 03/19/2021 CLINICAL DATA:  Chronic swallowing dysfunction of solids over 4 years. EXAM: ESOPHOGRAM/BARIUM SWALLOW  TECHNIQUE: Single contrast examination was performed using  thin barium. FLUOROSCOPY TIME:  Fluoroscopy Time:  2 minutes 48 seconds Radiation Exposure Index (if provided by the fluoroscopic device): 11.1 mGy Number of Acquired Spot Images: 0 COMPARISON:  CT evaluation from November of 2021 FINDINGS: Thin barium administered in AP and lateral projections without signs of swallow dysfunction grossly. Thin barium administered in oblique projection without signs of fixed narrowing of the esophagus. Signs of fold thickening are noted. Incidental note is made of changes of gastric bypass which are incompletely assessed on the current exam. Small hiatal hernia and patulous appearance of distal esophagus without signs of stricture. Single swallow assessment shows intact primary wave but with proximal  escape of much of the ingested bolus. Esophageal dysmotility and tertiary peristaltic activity noted. Upon swallowing of barium tablet it passes easily into the gastric pouch. During single swallow assessment in prone RAO position there was some reflux to the level of the proximal esophagus from the gastric pouch. IMPRESSION: 1. Esophageal dysmotility and tertiary peristaltic activity. 2. Small hiatal hernia and patulous appearance of distal esophagus. 3. No signs of fixed narrowing of the esophagus. Gastroesophageal reflux. 4. Fold thickening related to esophagitis. Electronically Signed   By: Zetta Bills M.D.   On: 03/19/2021 14:37        Scheduled Meds:   stroke: mapping our early stages of recovery book   Does not apply Once   (feeding supplement) PROSource Plus  30 mL Oral BID BM   aspirin EC  81 mg Oral Daily   clopidogrel  75 mg Oral Daily   DULoxetine  60 mg Oral Daily   enoxaparin (LOVENOX) injection  40 mg Subcutaneous Q24H   escitalopram  10 mg Oral QHS   feeding supplement  237 mL Oral BID BM   isosorbide mononitrate  30 mg Oral Daily   lidocaine  1 patch Transdermal Q24H   loratadine  10 mg Oral Daily   metoprolol succinate  100 mg Oral QHS   mirtazapine  7.5 mg Oral QHS   morphine  30 mg Oral Q12H   pantoprazole  40 mg Oral BID   pregabalin  75 mg Oral TID   Ensure Max Protein  11 oz Oral BID   spironolactone  25 mg Oral Daily   vitamin B-12  2,000 mcg Oral Daily   Continuous Infusions:   LOS: 3 days    Time spent: 37 minutes spent on chart review, discussion with nursing staff, consultants, updating family and interview/physical exam; more than 50% of that time was spent in counseling and/or coordination of care.    Anastacio Bua J [place] DO [facility] Available via Epic secure chat 7am-7pm After these hours, please refer to coverage provider listed on [email or web address] 03/20/2021, 1:05 PM

## 2021-03-20 NOTE — Plan of Care (Signed)
  Problem: Health Behavior/Discharge Planning: Goal: Ability to manage health-related needs will improve Outcome: Progressing   Problem: Clinical Measurements: Goal: Ability to maintain clinical measurements within normal limits will improve Outcome: Progressing Goal: Diagnostic test results will improve Outcome: Progressing Goal: Cardiovascular complication will be avoided Outcome: Progressing   Problem: Activity: Goal: Risk for activity intolerance will decrease Outcome: Progressing   Problem: Nutrition: Goal: Adequate nutrition will be maintained Outcome: Progressing   Problem: Coping: Goal: Level of anxiety will decrease Outcome: Progressing   Problem: Elimination: Goal: Will not experience complications related to bowel motility Outcome: Progressing Goal: Will not experience complications related to urinary retention Outcome: Progressing   Problem: Pain Managment: Goal: General experience of comfort will improve Outcome: Progressing

## 2021-03-20 NOTE — Progress Notes (Signed)
Physical Therapy Treatment Patient Details Name: Jodi Bowman MRN: [id number] DOB: 1961-05-17 Today's Date: 03/20/2021    History of Present Illness Pt is a 60 y/o female presenting on 03/12/2021 with weakness and facial droop after sustaining a fall. ED course includes stroke rule-out, negative MRI/CTA. PMH significant for fibromyalgia; s/p gastric bypass (2017); HLD; HTN; OSA; and CVA in 2000. Of note, pt with recent spinal cord stimulator implantation for chronic pain.    PT Comments    Patient progressing towards physical therapy goals. Patient ambulating 300' with RW and supervision. Patient negotiated 2 stairs with L handrail and min guard for safety. Patient disoriented to place and demos slow processing. Patient continues to be limited by generalized weakness, impaired balance, and decreased activity tolerance. Continue to recommend HHPT following discharge to maximize functional mobility and safety.    Follow Up Recommendations  Home health PT     Equipment Recommendations  None recommended by PT    Recommendations for Other Services       Precautions / Restrictions Precautions Precautions: Other (comment) Precaution Comments: Spinal cord stimulator with chronic back pain Restrictions Weight Bearing Restrictions: No    Mobility  Bed Mobility Overal bed mobility: Modified Independent                  Transfers Overall transfer level: Needs assistance Equipment used: Rolling Jodi Bowman (2 wheeled) Transfers: Sit to/from Stand Sit to Stand: Min assist         General transfer comment: minA to rise. Increased time to complete  Ambulation/Gait Ambulation/Gait assistance: Supervision Gait Distance (Feet): 300 Feet Assistive device: Rolling Jodi Bowman (2 wheeled) Gait Pattern/deviations: Step-through pattern;Decreased stride length Gait velocity: decreased   General Gait Details: slow, steady gait with RW. No overt LOB noted   Stairs Stairs: Yes Stairs  assistance: Min guard Stair Management: One rail Left;Forwards;Step to pattern Number of Stairs: 2 General stair comments: Two hands on L handrail with min guard for safety. Required HHA on last step during descent   Wheelchair Mobility    Modified Rankin (Stroke Patients Only)       Balance Overall balance assessment: Needs assistance Sitting-balance support: Feet supported Sitting balance-Leahy Scale: Fair     Standing balance support: Bilateral upper extremity supported;During functional activity Standing balance-Leahy Scale: Poor Standing balance comment: reliant on BUE support                            Cognition Arousal/Alertness: Awake/alert Behavior During Therapy: WFL for tasks assessed/performed Overall Cognitive Status: Impaired/Different from baseline Area of Impairment: Orientation;Problem solving                 Orientation Level: Disoriented to;Place           Problem Solving: Slow processing General Comments: During ambulation, patient stating "people ask me where I'm at and I always say hospital but never asked me which one" Patient unable to state correct hospital. Slow processing tasks      Exercises      General Comments        Pertinent Vitals/Pain Pain Assessment: Faces Faces Pain Scale: No hurt Pain Intervention(s): Monitored during session    Home Living                      Prior Function            PT Goals (current goals can now be found in the care  plan section) Acute Rehab PT Goals Patient Stated Goal: home PT Goal Formulation: With patient Time For Goal Achievement: 03/27/21 Potential to Achieve Goals: Good Progress towards PT goals: Progressing toward goals    Frequency    Min 3X/week      PT Plan Current plan remains appropriate    Co-evaluation              AM-PAC PT "6 Clicks" Mobility   Outcome Measure  Help needed turning from your back to your side while in a flat bed  without using bedrails?: A Little Help needed moving from lying on your back to sitting on the side of a flat bed without using bedrails?: A Little Help needed moving to and from a bed to a chair (including a wheelchair)?: A Little Help needed standing up from a chair using your arms (e.g., wheelchair or bedside chair)?: A Little Help needed to walk in hospital room?: A Little Help needed climbing 3-5 steps with a railing? : A Little 6 Click Score: 18    End of Session Equipment Utilized During Treatment: Gait belt Activity Tolerance: Patient tolerated treatment well Patient left: in bed;with call bell/phone within reach;with bed alarm set Nurse Communication: Mobility status PT Visit Diagnosis: Other abnormalities of gait and mobility (R26.89);Muscle weakness (generalized) (M62.81);History of falling (Z91.81);Difficulty in walking, not elsewhere classified (R26.2);Pain     Time: 2951-8841 PT Time Calculation (min) (ACUTE ONLY): 24 min  Charges:  $Therapeutic Activity: 23-37 mins                     Devinn Voshell A. Gilford Rile PT, DPT Acute Rehabilitation Services Pager [phone] Office [phone]    Linna Hoff 03/20/2021, 9:47 AM

## 2021-03-20 NOTE — Progress Notes (Signed)
RT NOTE: ? ?Pt refuses CPAP.  ?

## 2021-03-20 NOTE — Plan of Care (Signed)
  Problem: Health Behavior/Discharge Planning: Goal: Ability to manage health-related needs will improve Outcome: Progressing   Problem: Clinical Measurements: Goal: Ability to maintain clinical measurements within normal limits will improve Outcome: Progressing Goal: Diagnostic test results will improve Outcome: Progressing Goal: Cardiovascular complication will be avoided Outcome: Progressing   Problem: Activity: Goal: Risk for activity intolerance will decrease Outcome: Progressing   Problem: Nutrition: Goal: Adequate nutrition will be maintained Outcome: Progressing   Problem: Coping: Goal: Level of anxiety will decrease Outcome: Progressing   Problem: Elimination: Goal: Will not experience complications related to bowel motility Outcome: Progressing Goal: Will not experience complications related to urinary retention Outcome: Progressing   Problem: Pain Managment: Goal: General experience of comfort will improve Outcome: Progressing   Problem: Safety: Goal: Ability to remain free from injury will improve Outcome: Progressing

## 2021-03-21 MED ORDER — SODIUM CHLORIDE 0.9 % IV SOLN
INTRAVENOUS | Status: DC
Start: 2021-03-22 — End: 2021-03-22

## 2021-03-21 MED ORDER — ADULT MULTIVITAMIN W/MINERALS CH
1.0000 | ORAL_TABLET | Freq: Every day | ORAL | Status: DC
  Administered 2021-03-21 – 2021-03-22 (×2): 1 via ORAL
  Filled 2021-03-21 (×2): qty 1

## 2021-03-21 MED ORDER — LOPERAMIDE HCL 2 MG PO CAPS
2.0000 mg | ORAL_CAPSULE | ORAL | Status: DC | PRN
  Administered 2021-03-21: 2 mg via ORAL
  Filled 2021-03-21: qty 1

## 2021-03-21 NOTE — Progress Notes (Addendum)
    [facility] has been requested to perform a transesophageal echocardiogram on this patient for stroke and abnormal density on aortic valve. See previous notes - patient had complained of some dysphagia during last consent so was seen by GI who has now cleared her for TEE per notes. After careful review of history and examination, the risks and benefits of transesophageal echocardiogram have been explained including risks of esophageal damage, perforation (1:10,000 risk), bleeding, pharyngeal hematoma as well as other potential complications associated with conscious sedation including aspiration, arrhythmia, respiratory failure and death. Alternatives to treatment were discussed, questions were answered. Patient is willing to proceed. This is scheduled for 11am tomorrow morning with Dr. Oval Linsey. Orders written including NPO after midnight.  Charlie Pitter, PA-C 03/21/2021 12:17 PM

## 2021-03-21 NOTE — Progress Notes (Signed)
PROGRESS NOTE    Jodi Bowman  IWP:[id number] DOB: Nov 04, 1960 DOA: 03/12/2021 PCP: Aletha Halim., PA-C    Brief Narrative:  Jodi Bowman is a 60 year old female with past medical history significant for fibromyalgia, morbid obesity s/p gastric bypass (2017), hyperlipidemia, essential hypertension, OSA, CVA in 2000 and who presented to [facility] ED on 6/14 with weakness, facial droop.  Husband reports that he works nights and came home about 11:45 AM and she was in the kitchen making lunch, he heard her fall to the floor and noticed a facial droop.  She was able to eat a protein shake without difficulty.  Patient was notably somnolent in the ER, and currently took some "morphine", which she is supposed to be weaning off of her opiate painkillers since her spinal cord stimulator was placed.  Patient with similar episode in November last year which she took too many pills, but does not think that this is the case this time.  Patient does not recall falling, and was reportedly having hallucinations in the ED.  Code stroke was initiated in the ED.  CT head, MRI/CT angiogram negative.  Hospital service consulted for further evaluation and management.   Assessment & Plan:   Principal Problem:   Acute metabolic encephalopathy Active Problems:   Essential hypertension   Chronic pain syndrome   Mood disorder (HCC)   Metabolic encephalopathy   Dysphagia   Acute metabolic encephalopathy: Resolved TIA Patient presenting to the ED following fall at home, family noted weakness and facial droop.  Neurology was consulted and followed during hospital course.  EtOH level less than 10.  UDS positive for opiates.  Urinalysis unrevealing.  CT head without contrast with no evidence of acute intracranial abnormality, mild chronic microvascular ischemic disease noted.  MRI brain without contrast with no evidence of acute infarction.  CT angio head/neck with no large vessel occlusion, no  hemodynamically significant stenosis or evidence of dissection; no evidence of cord infarction or penumbra.  TTE with LVEF 50-53%, grade 1 diastolic dysfunction, no MR, no aortic stenosis, but note a small hazy mobile density ventricular side of AV leaflets, artifact versus mass. --Aspirin 81 mg p.o. daily --Plavix 75 mg p.o. daily --Outpatient follow-up with neurology  Aortic valve mass vs clot vs artifact TTE with findings of small hazy mobile density ventricular side of AV leaflets, suspicious for artifact versus mass. --TEE pending on 03/22/2021 --If TEE confirms TTE finding, will reach back out to neuro hospitalist team  Dysphagia Reported history of esophageal stricture Patient reports twice weekly issues with swallowing solid foods for many years, denies any issues with liquids.  Patient reports strong family history of esophageal strictures requiring dilation, patient denies any history of previous dilation attempts to herself.  Barium swallow with no sign of esophageal stricture, mild esophageal dysmotility.  Seen by GI with no further recommendations; and cleared for TEE.  Essential hypertension BP 110/69.  Home antihypertensive regimen includes isosorbide mononitrate 30 mg p.o. daily, losartan-HCTZ 100-12.5 mg p.o. daily, metoprolol succinate 100 mg p.o. nightly, spironolactone 25 mg p.o. daily. --Isosorbide mononitrate 30 mg p.o. daily --Metoprolol succinate 100 mg p.o. nightly --Spironolactone 25 mg p.o. daily --Continue to hold home losartan-HCTZ --Continue monitor BP  Chronic pain syndrome Has spinal cord stimulator in place. --MS Contin 30 mg p.o. twice daily --Norco 10-325 mg p.o. twice daily as needed --Lidoderm patch --Voltaren gel --Cymbalta 60 mg p.o. daily --Lyrica 75 mg p.o. 3 times daily  Mood disorder --Remeron 7.5 mg p.o.  daily --Lexapro 10 mg p.o. daily  GERD: Continue PPI twice daily  Weakness/debility/deconditioning: --PT recommends home health on  discharge --Continue therapy efforts while inpatient   DVT prophylaxis: enoxaparin (LOVENOX) injection 40 mg Start: 03/12/21 2200    Code Status: Full Code Family Communication: No family present at bedside this morning, updated patient's husband, Jodi Bowman via telephone yesterday afternoon.  Disposition Plan:  Level of care: Med-Surg Status is: Inpatient  Remains inpatient appropriate because:Ongoing diagnostic testing needed not appropriate for outpatient work up and Unsafe d/c plan  Dispo: The patient is from: Home              Anticipated d/c is to: Home              Patient currently is not medically stable to d/c.   Difficult to place patient No    Consultants:  Neurology  Procedures:  TTE TEE: Pending Barium swallow: No stricture noted  Antimicrobials:  None   Subjective: Patient seen examined at bedside, resting comfortably.  No significant complaints this morning.  Awaiting TEE planned for tomorrow.  Denies headache, no visual changes, no chest pain, no palpitations, no shortness of breath, no abdominal pain, no weakness, no fatigue, no paresthesias.  No acute events overnight per nursing staff.  Objective: Vitals:   03/20/21 2042 03/20/21 2334 03/21/21 0417 03/21/21 0724  BP: 100/66 122/68 110/65 126/82  Pulse: 81 63 72 63  Resp: 16 16 14 16   Temp: 98.4 F (36.9 C) 97.9 F (36.6 C) 98.5 F (36.9 C) (!) 97.3 F (36.3 C)  TempSrc: Oral Oral Oral Oral  SpO2: 100% 100% 99% (!) 89%  Weight:      Height:       No intake or output data in the 24 hours ending 03/21/21 1149   Filed Weights   03/12/21 1411 03/18/21 0416  Weight: 64 kg 61.2 kg    Examination:  General exam: Appears calm and comfortable  Respiratory system: Clear to auscultation. Respiratory effort normal.  On room air Cardiovascular system: S1 & S2 heard, RRR. No JVD, murmurs, rubs, gallops or clicks. No pedal edema. Gastrointestinal system: Abdomen is nondistended, soft and nontender. No  organomegaly or masses felt. Normal bowel sounds heard. Central nervous system: Alert and oriented. No focal neurological deficits. Extremities: Symmetric 5 x 5 power. Skin: No rashes, lesions or ulcers Psychiatry: Judgement and insight appear normal. Mood & affect appropriate.     Data Reviewed: I have personally reviewed following labs and imaging studies  CBC: Recent Labs  Lab 03/18/21 0349  WBC 11.5*  HGB 11.4*  HCT 35.2*  MCV 96.7  PLT 607   Basic Metabolic Panel: Recent Labs  Lab 03/18/21 0349  NA 143  K 4.0  CL 111  CO2 29  GLUCOSE 87  BUN 25*  CREATININE 0.83  CALCIUM 8.0*   GFR: Estimated Creatinine Clearance: 56.6 mL/min (by C-G formula based on SCr of 0.83 mg/dL). Liver Function Tests: No results for input(s): AST, ALT, ALKPHOS, BILITOT, PROT, ALBUMIN in the last 168 hours.  No results for input(s): LIPASE, AMYLASE in the last 168 hours. No results for input(s): AMMONIA in the last 168 hours. Coagulation Profile: No results for input(s): INR, PROTIME in the last 168 hours.  Cardiac Enzymes: No results for input(s): CKTOTAL, CKMB, CKMBINDEX, TROPONINI in the last 168 hours. BNP (last 3 results) No results for input(s): PROBNP in the last 8760 hours. HbA1C: No results for input(s): HGBA1C in the last 72 hours. CBG:  No results for input(s): GLUCAP in the last 168 hours.  Lipid Profile: No results for input(s): CHOL, HDL, LDLCALC, TRIG, CHOLHDL, LDLDIRECT in the last 72 hours. Thyroid Function Tests: No results for input(s): TSH, T4TOTAL, FREET4, T3FREE, THYROIDAB in the last 72 hours. Anemia Panel: No results for input(s): VITAMINB12, FOLATE, FERRITIN, TIBC, IRON, RETICCTPCT in the last 72 hours. Sepsis Labs: No results for input(s): PROCALCITON, LATICACIDVEN in the last 168 hours.  Recent Results (from the past 240 hour(s))  Resp Panel by RT-PCR (Flu A&B, Covid) Nasopharyngeal Swab     Status: None   Collection Time: 03/12/21  1:31 PM    Specimen: Nasopharyngeal Swab; Nasopharyngeal(NP) swabs in vial transport medium  Result Value Ref Range Status   SARS Coronavirus 2 by RT PCR NEGATIVE NEGATIVE Final    Comment: (NOTE) SARS-CoV-2 target nucleic acids are NOT DETECTED.  The SARS-CoV-2 RNA is generally detectable in upper respiratory specimens during the acute phase of infection. The lowest concentration of SARS-CoV-2 viral copies this assay can detect is 138 copies/mL. A negative result does not preclude SARS-Cov-2 infection and should not be used as the sole basis for treatment or other patient management decisions. A negative result may occur with  improper specimen collection/handling, submission of specimen other than nasopharyngeal swab, presence of viral mutation(s) within the areas targeted by this assay, and inadequate number of viral copies(<138 copies/mL). A negative result must be combined with clinical observations, patient history, and epidemiological information. The expected result is Negative.  Fact Sheet for Patients:  EntrepreneurPulse.com.au  Fact Sheet for Healthcare Providers:  IncredibleEmployment.be  This test is no t yet approved or cleared by the [place] FDA and  has been authorized for detection and/or diagnosis of SARS-CoV-2 by FDA under an Emergency Use Authorization (EUA). This EUA will remain  in effect (meaning this test can be used) for the duration of the COVID-19 declaration under Section 564(b)(1) of the Act, 21 U.S.C.section 360bbb-3(b)(1), unless the authorization is terminated  or revoked sooner.       Influenza A by PCR NEGATIVE NEGATIVE Final   Influenza B by PCR NEGATIVE NEGATIVE Final    Comment: (NOTE) The Xpert Xpress SARS-CoV-2/FLU/RSV plus assay is intended as an aid in the diagnosis of influenza from Nasopharyngeal swab specimens and should not be used as a sole basis for treatment. Nasal washings and aspirates are  unacceptable for Xpert Xpress SARS-CoV-2/FLU/RSV testing.  Fact Sheet for Patients: EntrepreneurPulse.com.au  Fact Sheet for Healthcare Providers: IncredibleEmployment.be  This test is not yet approved or cleared by the [place] FDA and has been authorized for detection and/or diagnosis of SARS-CoV-2 by FDA under an Emergency Use Authorization (EUA). This EUA will remain in effect (meaning this test can be used) for the duration of the COVID-19 declaration under Section 564(b)(1) of the Act, 21 U.S.C. section 360bbb-3(b)(1), unless the authorization is terminated or revoked.  Performed at [facility], [place] [address], [place], [place] [address]          Radiology Studies: DG ESOPHAGUS W SINGLE CM (SOL OR THIN BA)  Result Date: 03/19/2021 CLINICAL DATA:  Chronic swallowing dysfunction of solids over 4 years. EXAM: ESOPHOGRAM/BARIUM SWALLOW TECHNIQUE: Single contrast examination was performed using  thin barium. FLUOROSCOPY TIME:  Fluoroscopy Time:  2 minutes 48 seconds Radiation Exposure Index (if provided by the fluoroscopic device): 11.1 mGy Number of Acquired Spot Images: 0 COMPARISON:  CT evaluation from November of 2021 FINDINGS: Thin barium administered in AP and lateral  projections without signs of swallow dysfunction grossly. Thin barium administered in oblique projection without signs of fixed narrowing of the esophagus. Signs of fold thickening are noted. Incidental note is made of changes of gastric bypass which are incompletely assessed on the current exam. Small hiatal hernia and patulous appearance of distal esophagus without signs of stricture. Single swallow assessment shows intact primary wave but with proximal escape of much of the ingested bolus. Esophageal dysmotility and tertiary peristaltic activity noted. Upon swallowing of barium tablet it passes easily into the gastric pouch. During single swallow assessment in prone  RAO position there was some reflux to the level of the proximal esophagus from the gastric pouch. IMPRESSION: 1. Esophageal dysmotility and tertiary peristaltic activity. 2. Small hiatal hernia and patulous appearance of distal esophagus. 3. No signs of fixed narrowing of the esophagus. Gastroesophageal reflux. 4. Fold thickening related to esophagitis. Electronically Signed   By: Zetta Bills M.D.   On: 03/19/2021 14:37        Scheduled Meds:   stroke: mapping our early stages of recovery book   Does not apply Once   (feeding supplement) PROSource Plus  30 mL Oral BID BM   aspirin EC  81 mg Oral Daily   clopidogrel  75 mg Oral Daily   DULoxetine  60 mg Oral Daily   enoxaparin (LOVENOX) injection  40 mg Subcutaneous Q24H   escitalopram  10 mg Oral QHS   feeding supplement  237 mL Oral BID BM   isosorbide mononitrate  30 mg Oral Daily   lidocaine  1 patch Transdermal Q24H   loratadine  10 mg Oral Daily   metoprolol succinate  100 mg Oral QHS   mirtazapine  7.5 mg Oral QHS   morphine  30 mg Oral Q12H   pantoprazole  40 mg Oral BID   pregabalin  75 mg Oral TID   Ensure Max Protein  11 oz Oral BID   spironolactone  25 mg Oral Daily   vitamin B-12  2,000 mcg Oral Daily   Continuous Infusions:   LOS: 4 days    Time spent: 35 minutes spent on chart review, discussion with nursing staff, consultants, updating family and interview/physical exam; more than 50% of that time was spent in counseling and/or coordination of care.    Ocia Simek J [place] DO [facility] Available via Epic secure chat 7am-7pm After these hours, please refer to coverage provider listed on [email or web address] 03/21/2021, 11:49 AM

## 2021-03-21 NOTE — Progress Notes (Signed)
Nutrition Follow-up  DOCUMENTATION CODES:   Not applicable  INTERVENTION:  -d/c Ensure Max (pt does not like) -Ensure Enlive po BID, each supplement provides 350 kcal and 20 grams of protein -MVI with minerals   Recommend providing pt with TWO multivitamins per day given history of bariatric surgery in addition to Tums  Patients with VSG/RYGB history- order two MVI with minerals and 500 mg TUMS three times daily  Patients with BPD/DS history-order two MVI with minerals and 500 mg TUMS four times daily  NUTRITION DIAGNOSIS:   Inadequate oral intake related to poor appetite as evidenced by meal completion < 50%.  progressing  GOAL:   Patient will meet greater than or equal to 90% of their needs  progressing  MONITOR:   PO intake, Supplement acceptance, Weight trends, Labs, I & O's  REASON FOR ASSESSMENT:   Consult Assessment of nutrition requirement/status  ASSESSMENT:   Pt with a PMH significant for fibromyalgia, h/o gastric bypass (2017), HLD, HTN, OSA, chronic back pain s/p spinal cord stimulator, and h/o CVA (2000) presented s/p fall with acute metabolic encephalopathy and hallucinations. Note pt is on opiate analgesics and is being weaned off of opiods since placement of spinal cord stimulator.  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 TTE w/ findings of small hazy mobile density ventriclar side of AV leaflets, suspicious for artifact vs mass. TEE planned for tomorrow. Plan to re-consult neuro if TEE confirms findings of TTE.   Discussed pt with RN who reports pt does not like Ensure Max, but does like Ensure Enlive (strawberry flavor only). Will transition pt to this supplement to provide additional kcals/protein. RN reports pt doing very well with ProSource Plus supplement.   PO intake: 20-100% x 6 recorded meals (77.5% average meal intake)   Medications: Remeron, protonix, aldactone, vitamin B12 Labs reviewed.   Vitamin/MIneral Profile:  Thiamine  B1: 137.1 (WNL) Vitamin B12: 341 (WNL) Folate B9: 18.9 (WNL) Vitamin A: pending Vitamin D: 66.98 (WNL) Vitamin C: pending  Copper: pending  Selenium: pending Zinc: pending  Ferritin: pending   3x unmeasured stool and urinary occurrences today  Diet Order:   Diet Order             Diet NPO time specified Except for: Sips with Meds  Diet effective midnight           Diet Heart Room service appropriate? Yes; Fluid consistency: Thin  Diet effective now                   EDUCATION NEEDS:   No education needs have been identified at this time  Skin:  Skin Assessment: Reviewed RN Assessment  Last BM:  6/23  Height:   Ht Readings from Last 1 Encounters:  03/12/21 4\' 11"  (1.499 m)    Weight:   Wt Readings from Last 1 Encounters:  03/18/21 61.2 kg   BMI:  Body mass index is 27.25 kg/m.  Estimated Nutritional Needs:   Kcal:  1600-1800  Protein:  80-90g  Fluid:  >1.6L/d    Larkin Ina, MS, RD, LDN (she/her/hers) RD pager number and weekend/on-call pager number located in [place].

## 2021-03-21 NOTE — Care Management Important Message (Signed)
Important Message  Patient Details  Name: Jodi Bowman MRN: [id number] Date of Birth: 09-Dec-1960   Medicare Important Message Given:  Yes     [name] Bobby Barton 03/21/2021, 3:14 PM

## 2021-03-22 ENCOUNTER — Encounter (HOSPITAL_COMMUNITY): Payer: Self-pay | Admitting: Family Medicine

## 2021-03-22 ENCOUNTER — Inpatient Hospital Stay (HOSPITAL_COMMUNITY): Payer: Medicare (Managed Care)

## 2021-03-22 ENCOUNTER — Inpatient Hospital Stay (HOSPITAL_COMMUNITY): Payer: Medicare (Managed Care) | Admitting: Anesthesiology

## 2021-03-22 ENCOUNTER — Encounter (HOSPITAL_COMMUNITY)
Admission: EM | Disposition: A | Discharge status: Home Health | Payer: Self-pay | Source: Home / Self Care | Attending: Internal Medicine

## 2021-03-22 DIAGNOSIS — I361 Nonrheumatic tricuspid (valve) insufficiency: Secondary | ICD-10-CM

## 2021-03-22 DIAGNOSIS — I639 Cerebral infarction, unspecified: Secondary | ICD-10-CM

## 2021-03-22 HISTORY — PX: TEE WITHOUT CARDIOVERSION: SHX5443

## 2021-03-22 SURGERY — ECHOCARDIOGRAM, TRANSESOPHAGEAL
Anesthesia: Monitor Anesthesia Care

## 2021-03-22 MED ORDER — CLOPIDOGREL BISULFATE 75 MG PO TABS: 75.0000 mg | ORAL_TABLET | Freq: Every day | ORAL | 2 refills | Status: DC

## 2021-03-22 MED ORDER — ONDANSETRON HCL 4 MG/2ML IJ SOLN
INTRAMUSCULAR | Status: DC | PRN
  Administered 2021-03-22: 4 mg via INTRAVENOUS

## 2021-03-22 MED ORDER — PROPOFOL 500 MG/50ML IV EMUL
INTRAVENOUS | Status: DC | PRN
  Administered 2021-03-22: 200 ug/kg/min via INTRAVENOUS

## 2021-03-22 MED ORDER — BUTAMBEN-TETRACAINE-BENZOCAINE 2-2-14 % EX AERO
INHALATION_SPRAY | CUTANEOUS | Status: DC | PRN
  Administered 2021-03-22: 2 via TOPICAL

## 2021-03-22 MED ORDER — PHENYLEPHRINE 40 MCG/ML (10ML) SYRINGE FOR IV PUSH (FOR BLOOD PRESSURE SUPPORT)
PREFILLED_SYRINGE | INTRAVENOUS | Status: DC | PRN
  Administered 2021-03-22: 120 ug via INTRAVENOUS
  Administered 2021-03-22: 80 ug via INTRAVENOUS
  Administered 2021-03-22: 120 ug via INTRAVENOUS
  Administered 2021-03-22: 40 ug via INTRAVENOUS
  Administered 2021-03-22 (×2): 80 ug via INTRAVENOUS

## 2021-03-22 MED ORDER — EPHEDRINE SULFATE-NACL 50-0.9 MG/10ML-% IV SOSY
PREFILLED_SYRINGE | INTRAVENOUS | Status: DC | PRN
  Administered 2021-03-22: 15 mg via INTRAVENOUS
  Administered 2021-03-22 (×2): 10 mg via INTRAVENOUS
  Administered 2021-03-22: 15 mg via INTRAVENOUS

## 2021-03-22 NOTE — Anesthesia Preprocedure Evaluation (Addendum)
Anesthesia Evaluation  Patient identified by MRN, date of birth, ID band Patient awake    Reviewed: Allergy & Precautions, NPO status , Patient's Chart, lab work & pertinent test results, reviewed documented beta blocker date and time   History of Anesthesia Complications (+) PONV and history of anesthetic complications  Airway Mallampati: IV  TM Distance: >3 FB Neck ROM: Full    Dental  (+) Dental Advisory Given, Chipped   Pulmonary sleep apnea (no longer using CPAP) ,    Pulmonary exam normal        Cardiovascular hypertension, Pt. on medications and Pt. on home beta blockers Normal cardiovascular exam   '22 TTE - false tendon in the LV apex of no clinical significance.EF 65 to 70%. Mild concentric left ventricular  hypertrophy. Grade I diastolic dysfunction (impaired  relaxation). There is a very small hazy mobile density on the ventricular side of the AV leaflets seen only in the parasternal long axis view. This may represent artifact but in setting of CVA, need to consider mass. Mild to moderate aortic valve sclerosis/calcification is present, without any evidence of aortic stenosis.     Neuro/Psych  Headaches, PSYCHIATRIC DISORDERS Anxiety CVA, Residual Symptoms    GI/Hepatic Neg liver ROS, GERD  Medicated and Controlled,  Endo/Other  negative endocrine ROS  Renal/GU negative Renal ROS     Musculoskeletal  (+) Arthritis , Fibromyalgia -, narcotic dependent  Abdominal   Peds  Hematology  (+) anemia ,   Anesthesia Other Findings   Reproductive/Obstetrics                            Anesthesia Physical Anesthesia Plan  ASA: 3  Anesthesia Plan: MAC   Post-op Pain Management:    Induction: Intravenous  PONV Risk Score and Plan: 3 and Propofol infusion and Treatment may vary due to age or medical condition  Airway Management Planned: Nasal Cannula and Natural Airway  Additional  Equipment: None  Intra-op Plan:   Post-operative Plan:   Informed Consent: I have reviewed the patients History and Physical, chart, labs and discussed the procedure including the risks, benefits and alternatives for the proposed anesthesia with the patient or authorized representative who has indicated his/her understanding and acceptance.       Plan Discussed with: CRNA and Anesthesiologist  Anesthesia Plan Comments:        Anesthesia Quick Evaluation

## 2021-03-22 NOTE — Discharge Summary (Signed)
Physician Discharge Summary  Jodi Bowman QZE:[id number] DOB: Jul 25, 1961 DOA: 03/12/2021  PCP: Aletha Halim., PA-C  Admit date: 03/12/2021 Discharge date: 03/22/2021  Admitted From: Home Disposition: Home  Recommendations for Outpatient Follow-up:  Follow up with PCP in 1-2 weeks Follow-up with neurology, 8 weeks for TIA follow-up Discontinued losartan/HCTZ due to borderline hypotension Started on Plavix, continue until neurology follow-up  Home Health: PT Equipment/Devices: None  Discharge Condition: Stable CODE STATUS: Full code Diet recommendation: Heart healthy diet  History of present illness:  Jodi Bowman is a 60 year old female with past medical history significant for fibromyalgia, morbid obesity s/p gastric bypass (2017), hyperlipidemia, essential hypertension, OSA, CVA in 2000 and who presented to [facility] ED on 6/14 with weakness, facial droop.  Husband reports that he works nights and came home about 11:45 AM and she was in the kitchen making lunch, he heard her fall to the floor and noticed a facial droop.  She was able to eat a protein shake without difficulty.  Patient was notably somnolent in the ER, and currently took some "morphine", which she is supposed to be weaning off of her opiate painkillers since her spinal cord stimulator was placed.  Patient with similar episode in November last year which she took too many pills, but does not think that this is the case this time.  Patient does not recall falling, and was reportedly having hallucinations in the ED.  Code stroke was initiated in the ED.  CT head, MRI/CT angiogram negative.  Hospital service consulted for further evaluation and management.  Hospital course:  Acute metabolic encephalopathy: Resolved TIA Patient presenting to the ED following fall at home, family noted weakness and facial droop.  Neurology was consulted and followed during hospital course.  EtOH level less than 10.  UDS positive  for opiates.  Urinalysis unrevealing.  CT head without contrast with no evidence of acute intracranial abnormality, mild chronic microvascular ischemic disease noted.  MRI brain without contrast with no evidence of acute infarction.  CT angio head/neck with no large vessel occlusion, no hemodynamically significant stenosis or evidence of dissection; no evidence of cord infarction or penumbra.  TTE with LVEF 22-63%, grade 1 diastolic dysfunction, no MR, no aortic stenosis, but note a small hazy mobile density ventricular side of AV leaflets, artifact versus mass.  TEE with LVEF 60 to 65%, mild TR, minimal MR, mild thickening of the left coronary cusp of the aortic valve without thrombus or vegetation, no aortic stenosis/AR, no left atrial/left atrial appendage thrombus or mass.  Patient will continue aspirin 81 mg p.o. daily, started on Plavix 75 mg p.o. daily.  Outpatient follow-up with neurology 8 weeks.   Thickening left coronary cusp aortic valve TTE with findings of small hazy mobile density ventricular side of AV leaflets, suspicious for artifact versus mass. TEE with LVEF 60 to 65%, mild TR, minimal MR, mild thickening of the left coronary cusp of the aortic valve without thrombus or vegetation, no aortic stenosis/AR, no left atrial/left atrial appendage thrombus or mass.    Dysphagia Reported history of esophageal stricture Patient reports twice weekly issues with swallowing solid foods for many years, denies any issues with liquids.  Patient reports strong family history of esophageal strictures requiring dilation, patient denies any history of previous dilation attempts to herself.  Barium swallow with no sign of esophageal stricture, mild esophageal dysmotility.  Seen by GI with no further recommendations; and cleared for TEE.   Essential hypertension BP 110/69.  Home  antihypertensive regimen includes isosorbide mononitrate 30 mg p.o. daily, losartan-HCTZ 100-12.5 mg p.o. daily, metoprolol  succinate 100 mg p.o. nightly, spironolactone 25 mg p.o. daily.  Discontinued home losartan-HCTZ due to borderline hypotension.  Follow-up with PCP for further monitoring of blood pressure.   Chronic pain syndrome Has spinal cord stimulator in place. Continue home MS Contin 30 mg p.o. twice daily, Norco 10-325 mg p.o. twice daily as needed, Cymbalta 60 mg p.o. daily, and Lyrica 75 mg p.o. 3 times daily   Mood disorder Remeron 7.5 mg p.o. daily, Lexapro 10 mg p.o. daily   GERD: Continue PPI twice daily   Weakness/debility/deconditioning: PT recommends home health on discharge  Discharge Diagnoses:  Active Problems:   Essential hypertension   Chronic pain syndrome   Mood disorder [facility])   Dysphagia    Discharge Instructions  Discharge Instructions     Ambulatory referral to Neurology   Complete by: As directed    An appointment is requested in approximately: 8 weeks   Call MD for:  difficulty breathing, headache or visual disturbances   Complete by: As directed    Call MD for:  extreme fatigue   Complete by: As directed    Call MD for:  persistant dizziness or light-headedness   Complete by: As directed    Call MD for:  persistant nausea and vomiting   Complete by: As directed    Call MD for:  severe uncontrolled pain   Complete by: As directed    Call MD for:  temperature >100.4   Complete by: As directed    Diet - low sodium heart healthy   Complete by: As directed    Increase activity slowly   Complete by: As directed       Allergies as of 03/22/2021       Reactions   Shrimp [shellfish Allergy] Anaphylaxis   Naproxen Other (See Comments)   Makes stomach cramp and burn badly   Shellfish-derived Products    Other reaction(s): GI Upset (intolerance)        Medication List     STOP taking these medications    losartan-hydrochlorothiazide 100-12.5 MG tablet Commonly known as: HYZAAR   nitroGLYCERIN 0.4 MG SL tablet Commonly known as: NITROSTAT    potassium chloride SA 20 MEQ tablet Commonly known as: KLOR-CON       TAKE these medications    albuterol 108 (90 Base) MCG/ACT inhaler Commonly known as: VENTOLIN HFA Inhale 1-2 puffs into the lungs every 6 (six) hours as needed for wheezing or shortness of breath.   aspirin EC 81 MG tablet Take 81 mg by mouth daily.   clopidogrel 75 MG tablet Commonly known as: PLAVIX Take 1 tablet (75 mg total) by mouth daily.   D3-50 1.25 MG (50000 UT) capsule Generic drug: Cholecalciferol Take 50,000 Units by mouth 3 (three) times a week.   diclofenac Sodium 1 % Gel Commonly known as: VOLTAREN Apply 4 g topically 2 (two) times daily as needed (back pain).   DULoxetine 60 MG capsule Commonly known as: CYMBALTA Take 60 mg by mouth daily.   escitalopram 10 MG tablet Commonly known as: LEXAPRO Take 10 mg by mouth at bedtime.   ferrous sulfate 325 (65 FE) MG EC tablet Take 325 mg by mouth daily.   fluticasone 50 MCG/ACT nasal spray Commonly known as: FLONASE Place 1 spray into both nostrils daily as needed for allergies.   folic acid 1 MG tablet Commonly known as: FOLVITE Take 1 mg by  mouth daily.   HYDROcodone-acetaminophen 10-325 MG tablet Commonly known as: NORCO Take 1 tablet by mouth 2 (two) times daily as needed for moderate pain or severe pain.   isosorbide mononitrate 30 MG 24 hr tablet Commonly known as: IMDUR Take 1 tablet (30 mg total) by mouth daily.   loratadine 10 MG tablet Commonly known as: CLARITIN Take 10 mg by mouth daily.   Magnesium Oxide 400 MG Caps Take 1 capsule (400 mg total) by mouth daily.   metoprolol succinate 100 MG 24 hr tablet Commonly known as: TOPROL-XL Take 100 mg by mouth at bedtime.   mirtazapine 7.5 MG tablet Commonly known as: REMERON Take 7.5 mg by mouth at bedtime.   morphine 30 MG 12 hr tablet Commonly known as: MS CONTIN Take 30 mg by mouth every 12 (twelve) hours.   ondansetron 4 MG disintegrating tablet Commonly  known as: ZOFRAN-ODT Take 4 mg by mouth every 8 (eight) hours as needed for nausea/vomiting.   pantoprazole 40 MG tablet Commonly known as: PROTONIX Take 1 tablet (40 mg total) by mouth 2 (two) times daily.   polyethylene glycol 17 g packet Commonly known as: MIRALAX / GLYCOLAX Take 17 g by mouth daily as needed for mild constipation.   pregabalin 75 MG capsule Commonly known as: Lyrica Take 1 capsule (75 mg total) by mouth 3 (three) times daily.   spironolactone 25 MG tablet Commonly known as: ALDACTONE Take 25 mg by mouth daily.        Follow-up [facility].   Why: The home health agency will contact you for the first home visit. Contact information: [phone]        Garvin Fila, MD. Schedule an appointment as soon as possible for a visit in 8 week(s).   Specialties: Neurology, Radiology Contact information: [address] [place] [address] [phone]                Allergies  Allergen Reactions   Shrimp [Shellfish Allergy] Anaphylaxis   Naproxen Other (See Comments)    Makes stomach cramp and burn badly   Shellfish-Derived Products     Other reaction(s): GI Upset (intolerance)    Consultations: Neurology   Procedures/Studies: MR BRAIN WO CONTRAST  Result Date: 03/14/2021 CLINICAL DATA:  Stroke, follow-up. Additional provided: Weakness, facial droop. EXAM: MRI HEAD WITHOUT CONTRAST TECHNIQUE: Multiplanar, multiecho pulse sequences of the brain and surrounding structures were obtained without intravenous contrast. COMPARISON:  Noncontrast head CT and CT angiogram head/neck 03/12/2021. Brain MRI 06/29/2020. FINDINGS: Brain: The examination is intermittently motion degraded, limiting evaluation. Most notably, there is moderate motion degradation of the sagittal T1 weighted sequence, moderate motion degradation of the axial T2 FLAIR sequence, severe motion degradation of the axial T1 weighted  sequence, moderate/severe motion degradation of the axial T2 sequence and moderate/severe motion degradation of the coronal T2 sequence. Cerebral volume is normal for age. Mild multifocal T2/FLAIR hyperintensity within the cerebral white matter and pons, nonspecific but compatible with chronic small vessel ischemic disease. There is no acute infarct. No evidence of intracranial mass. No chronic intracranial blood products. No extra-axial fluid collection. No midline shift. Partially empty sella turcica. Vascular: Expected proximal arterial flow voids. Skull and upper cervical spine: No focal marrow lesion is identified. Sinuses/Orbits: Visualized orbits show no acute finding. No significant paranasal sinus disease at the imaged levels. IMPRESSION: Motion degraded examination, as described and limiting evaluation. The diffusion-weighted imaging is of good quality.  No evidence of acute infarction. No acute intracranial abnormality is identified. Mild chronic small vessel ischemic changes within the cerebral white matter and pons, stable as compared to the brain MRI of 06/29/2020. Electronically Signed   By: Kellie Simmering DO   On: 03/14/2021 13:48   ECHOCARDIOGRAM COMPLETE BUBBLE STUDY  Result Date: 03/14/2021    ECHOCARDIOGRAM REPORT   Patient Name:   Jodi Bowman [facility] Date of Exam: 03/14/2021 Medical Rec #:  [id number]        Height:       59.0 in Accession #:    [id number]       Weight:       141.1 lb Date of Birth:  1960-12-14       BSA:          1.590 m Patient Age:    20 years         BP:           144/60 mmHg Patient Gender: F                HR:           87 bpm. Exam Location:  Inpatient Procedure: 2D Echo, Cardiac Doppler, Color Doppler and Saline Contrast Bubble            Study Indications:    Stroke I63.9  History:        Patient has prior history of Echocardiogram examinations, most                 recent 08/26/2019. Risk Factors:Hypertension, Dyslipidemia,                 Sleep Apnea and GERD.   Sonographer:    Jonelle Sidle Dance Referring Phys: [id number] Jennye Boroughs  Sonographer Comments: No subcostal window. IMPRESSIONS  1. There is a false tendon in the LV apex of no clinical significance     . Left ventricular ejection fraction, by estimation, is 65 to 70%. The left ventricle has normal function. The left ventricle has no regional wall motion abnormalities. There is mild concentric left ventricular hypertrophy. Left ventricular diastolic  parameters are consistent with Grade I diastolic dysfunction (impaired relaxation).  2. Right ventricular systolic function is normal. The right ventricular size is normal. Tricuspid regurgitation signal is inadequate for assessing PA pressure.  3. The mitral valve is normal in structure. No evidence of mitral valve regurgitation. No evidence of mitral stenosis.  4. There is a very small hazy mobile density on the ventricular side of the AV leaflets seen only in the parasternal long axis view. This may represent artifact but in setting of CVA, need to consider mass. If clinical suspicion high then recommend TEE.  The aortic valve is calcified. There is moderate calcification of the aortic valve. There is moderate thickening of the aortic valve. Aortic valve regurgitation is not visualized. Mild to moderate aortic valve sclerosis/calcification is present, without  any evidence of aortic stenosis. FINDINGS  Left Ventricle: There is a false tendon in the LV apex of no clinical significance. Left ventricular ejection fraction, by estimation, is 65 to 70%. The left ventricle has normal function. The left ventricle has no regional wall motion abnormalities. The left ventricular internal cavity size was normal in size. There is mild concentric left ventricular hypertrophy. Left ventricular diastolic parameters are consistent with Grade I diastolic dysfunction (impaired relaxation). Normal left ventricular filling pressure. Right Ventricle: The right ventricular size is normal. No  increase in right ventricular wall  thickness. Right ventricular systolic function is normal. Tricuspid regurgitation signal is inadequate for assessing PA pressure. Left Atrium: Left atrial size was normal in size. Right Atrium: Right atrial size was normal in size. Prominent Chiari network. Pericardium: There is no evidence of pericardial effusion. Mitral Valve: The mitral valve is normal in structure. No evidence of mitral valve regurgitation. No evidence of mitral valve stenosis. Tricuspid Valve: The tricuspid valve is normal in structure. Tricuspid valve regurgitation is trivial. No evidence of tricuspid stenosis. Aortic Valve: There is a very small hazy mobile density on the ventricular side of the AV leaflets seen only in the parasternal long axis view. This may represent artifact but in setting of CVA, need to consider mass. If clinical suspicion high then recommend TEE. The aortic valve is calcified. There is moderate calcification of the aortic valve. There is moderate thickening of the aortic valve. Aortic valve regurgitation is not visualized. Mild to moderate aortic valve sclerosis/calcification is present, without any evidence of aortic stenosis. Pulmonic Valve: The pulmonic valve was normal in structure. Pulmonic valve regurgitation is not visualized. No evidence of pulmonic stenosis. Aorta: The aortic root is normal in size and structure. Venous: The inferior vena cava was not well visualized. IAS/Shunts: No atrial level shunt detected by color flow Doppler. Agitated saline contrast was given intravenously to evaluate for intracardiac shunting.  LEFT VENTRICLE PLAX 2D LVIDd:         3.60 cm  Diastology LVIDs:         2.40 cm  LV e' medial:    4.57 cm/s LV PW:         1.20 cm  LV E/e' medial:  12.2 LV IVS:        1.20 cm  LV e' lateral:   7.18 cm/s LVOT diam:     2.00 cm  LV E/e' lateral: 7.8 LV SV:         59 LV SV Index:   37 LVOT Area:     3.14 cm  RIGHT VENTRICLE RV Basal diam:  2.40 cm RV S  prime:     10.70 cm/s TAPSE (M-mode): 1.3 cm LEFT ATRIUM             Index       RIGHT ATRIUM           Index LA diam:        3.10 cm 1.95 cm/m  RA Area:     10.60 cm LA Vol (A2C):   46.6 ml 29.30 ml/m RA Volume:   22.30 ml  14.02 ml/m LA Vol (A4C):   27.4 ml 17.23 ml/m LA Biplane Vol: 37.5 ml 23.58 ml/m  AORTIC VALVE LVOT Vmax:   93.30 cm/s LVOT Vmean:  73.200 cm/s LVOT VTI:    0.189 m  AORTA Ao Root diam: 2.90 cm Ao Asc diam:  2.50 cm MITRAL VALVE MV Area (PHT): 3.28 cm    SHUNTS MV Decel Time: 231 msec    Systemic VTI:  0.19 m MV E velocity: 55.70 cm/s  Systemic Diam: 2.00 cm MV A velocity: 84.00 cm/s MV E/A ratio:  0.66 Fransico Him MD Electronically signed by Fransico Him MD Signature Date/Time: 03/14/2021/11:17:09 AM    Final    CT HEAD CODE STROKE WO CONTRAST  Result Date: 03/12/2021 CLINICAL DATA:  Code stroke.  Neuro deficit, acute stroke suspected. EXAM: CT HEAD WITHOUT CONTRAST TECHNIQUE: Contiguous axial images were obtained from the base of the skull through the vertex without intravenous contrast.  COMPARISON:  CT head August 27, 2020 FINDINGS: Brain: No evidence of acute large vascular territory infarction, hemorrhage, hydrocephalus, extra-axial collection or mass lesion/mass effect. Mild patchy white matter hypoattenuation, most likely related chronic microvascular disease. Vascular: No hyperdense vessel identified. Skull: No acute fracture. Sinuses/Orbits: Small amount of fluid layering in the left maxillary sinus. Otherwise, clear visualized sinuses. Other: No mastoid effusions. ASPECTS [facility] Stroke Program Early CT Score) Total score (0-10 with 10 being normal): 10. IMPRESSION: 1. No evidence of acute intracranial abnormality. ASPECTS is 10. 2. Mild chronic microvascular ischemic disease. Code stroke imaging results were communicated on 03/12/2021 at 1:53 pm to provider Dr. Cheral Marker via secure text paging. Electronically Signed   By: Margaretha Sheffield MD   On: 03/12/2021 13:55   CT  ANGIO HEAD NECK W WO CM W PERF (CODE STROKE)  Result Date: 03/12/2021 CLINICAL DATA:  Right facial droop EXAM: CT ANGIOGRAPHY HEAD AND NECK CT PERFUSION BRAIN TECHNIQUE: Multidetector CT imaging of the head and neck was performed using the standard protocol during bolus administration of intravenous contrast. Multiplanar CT image reconstructions and MIPs were obtained to evaluate the vascular anatomy. Carotid stenosis measurements (when applicable) are obtained utilizing NASCET criteria, using the distal internal carotid diameter as the denominator. Multiphase CT imaging of the brain was performed following IV bolus contrast injection. Subsequent parametric perfusion maps were calculated using RAPID software. CONTRAST:  73mL OMNIPAQUE IOHEXOL 350 MG/ML SOLN COMPARISON:  None. FINDINGS: CTA NECK Aortic arch: Great vessel origins are patent. There is direct origin of the left artery from the arch. Right carotid system: Patent. Mild mixed plaque along the proximal internal carotid without stenosis. Left carotid system: Patent. Minimal mixed plaque along the proximal internal carotid without stenosis. Vertebral arteries: Patent. Right vertebral artery slightly dominant. No stenosis. Skeleton: Minor degenerative changes of the included spine. Other neck: Multinodular thyroid, with previous biopsy on the right in 2015. Upper chest: Included upper lungs are clear. Review of the MIP images confirms the above findings CTA HEAD Anterior circulation: Intracranial internal carotid arteries are patent with minimal calcified plaque. Anterior and middle cerebral arteries are patent. Posterior circulation: Intracranial vertebral arteries are patent. Basilar artery is patent. Major cerebellar artery origins are patent. Posterior communicating artery is identified on the left. Possible diminutive posterior communicating artery on the right. Posterior cerebral arteries are patent. Venous sinuses: Patent as allowed by contrast bolus  timing. Review of the MIP images confirms the above findings CT Brain Perfusion Findings: CBF (<30%) Volume: 40mL Perfusion (Tmax>6.0s) volume: 19mL Mismatch Volume: 92mL Infarction Location: None IMPRESSION: No large vessel occlusion, hemodynamically significant stenosis, or evidence of dissection. Perfusion imaging demonstrates no evidence of core infarction or penumbra. These results were communicated to Dr. Cheral Marker at 2:11 pm on 03/12/2021 by text page via the [facility] messaging system. Electronically Signed   By: Macy Mis M.D.   On: 03/12/2021 14:21   DG ESOPHAGUS W SINGLE CM (SOL OR THIN BA)  Result Date: 03/19/2021 CLINICAL DATA:  Chronic swallowing dysfunction of solids over 4 years. EXAM: ESOPHOGRAM/BARIUM SWALLOW TECHNIQUE: Single contrast examination was performed using  thin barium. FLUOROSCOPY TIME:  Fluoroscopy Time:  2 minutes 48 seconds Radiation Exposure Index (if provided by the fluoroscopic device): 11.1 mGy Number of Acquired Spot Images: 0 COMPARISON:  CT evaluation from November of 2021 FINDINGS: Thin barium administered in AP and lateral projections without signs of swallow dysfunction grossly. Thin barium administered in oblique projection without signs of fixed narrowing of the esophagus. Signs of  fold thickening are noted. Incidental note is made of changes of gastric bypass which are incompletely assessed on the current exam. Small hiatal hernia and patulous appearance of distal esophagus without signs of stricture. Single swallow assessment shows intact primary wave but with proximal escape of much of the ingested bolus. Esophageal dysmotility and tertiary peristaltic activity noted. Upon swallowing of barium tablet it passes easily into the gastric pouch. During single swallow assessment in prone RAO position there was some reflux to the level of the proximal esophagus from the gastric pouch. IMPRESSION: 1. Esophageal dysmotility and tertiary peristaltic activity. 2. Small hiatal  hernia and patulous appearance of distal esophagus. 3. No signs of fixed narrowing of the esophagus. Gastroesophageal reflux. 4. Fold thickening related to esophagitis. Electronically Signed   By: Zetta Bills M.D.   On: 03/19/2021 14:37     Subjective: Patient seen examined bedside, resting comfortably.  Just returned back from TEE.  Family members present in room.  TEE with no clot/mass or thrombus.  Ready for discharge home.  Discussed with patient and family members present discontinuation of HCTZ and losartan due to borderline hypotension.  No other questions or concerns at this time.  Denies headache, no dizziness, no chest pain, no palpitations, no shortness of breath, no abdominal pain, no weakness, no fatigue, no paresthesias.  No acute events overnight per nursing staff.  Discharge Exam: Vitals:   03/22/21 1000 03/22/21 1023  BP: (!) 119/39 130/70  Pulse: 72 (!) 59  Resp: 13 14  Temp:  98.3 F (36.8 C)  SpO2: 100%    Vitals:   03/22/21 0940 03/22/21 0950 03/22/21 1000 03/22/21 1023  BP: (!) 122/54 126/62 (!) 119/39 130/70  Pulse: 70 (!) 58 72 (!) 59  Resp: 14 12 13 14   Temp:    98.3 F (36.8 C)  TempSrc:    Oral  SpO2: 100% 100% 100%   Weight:      Height:        General: Pt is alert, awake, not in acute distress Cardiovascular: RRR, S1/S2 +, no rubs, no gallops Respiratory: CTA bilaterally, no wheezing, no rhonchi Abdominal: Soft, NT, ND, bowel sounds + Extremities: no edema, no cyanosis    The results of significant diagnostics from this hospitalization (including imaging, microbiology, ancillary and laboratory) are listed below for reference.     Microbiology: Recent Results (from the past 240 hour(s))  Resp Panel by RT-PCR (Flu A&B, Covid) Nasopharyngeal Swab     Status: None   Collection Time: 03/12/21  1:31 PM   Specimen: Nasopharyngeal Swab; Nasopharyngeal(NP) swabs in vial transport medium  Result Value Ref Range Status   SARS Coronavirus 2 by RT PCR  NEGATIVE NEGATIVE Final    Comment: (NOTE) SARS-CoV-2 target nucleic acids are NOT DETECTED.  The SARS-CoV-2 RNA is generally detectable in upper respiratory specimens during the acute phase of infection. The lowest concentration of SARS-CoV-2 viral copies this assay can detect is 138 copies/mL. A negative result does not preclude SARS-Cov-2 infection and should not be used as the sole basis for treatment or other patient management decisions. A negative result may occur with  improper specimen collection/handling, submission of specimen other than nasopharyngeal swab, presence of viral mutation(s) within the areas targeted by this assay, and inadequate number of viral copies(<138 copies/mL). A negative result must be combined with clinical observations, patient history, and epidemiological information. The expected result is Negative.  Fact Sheet for Patients:  EntrepreneurPulse.com.au  Fact Sheet for Healthcare Providers:  IncredibleEmployment.be  This test is no t yet approved or cleared by the [place] and  has been authorized for detection and/or diagnosis of SARS-CoV-2 by FDA under an Emergency Use Authorization (EUA). This EUA will remain  in effect (meaning this test can be used) for the duration of the COVID-19 declaration under Section 564(b)(1) of the Act, 21 U.S.C.section 360bbb-3(b)(1), unless the authorization is terminated  or revoked sooner.       Influenza A by PCR NEGATIVE NEGATIVE Final   Influenza B by PCR NEGATIVE NEGATIVE Final    Comment: (NOTE) The Xpert Xpress SARS-CoV-2/FLU/RSV plus assay is intended as an aid in the diagnosis of influenza from Nasopharyngeal swab specimens and should not be used as a sole basis for treatment. Nasal washings and aspirates are unacceptable for Xpert Xpress SARS-CoV-2/FLU/RSV testing.  Fact Sheet for Patients: EntrepreneurPulse.com.au  Fact Sheet for  Healthcare Providers: IncredibleEmployment.be  This test is not yet approved or cleared by the [place] FDA and has been authorized for detection and/or diagnosis of SARS-CoV-2 by FDA under an Emergency Use Authorization (EUA). This EUA will remain in effect (meaning this test can be used) for the duration of the COVID-19 declaration under Section 564(b)(1) of the Act, 21 U.S.C. section 360bbb-3(b)(1), unless the authorization is terminated or revoked.  Performed at [facility], [place] [address] [place], [place] [address]      Labs: BNP (last 3 results) No results for input(s): BNP in the last 8760 hours. Basic Metabolic Panel: Recent Labs  Lab 03/18/21 0349  NA 143  K 4.0  CL 111  CO2 29  GLUCOSE 87  BUN 25*  CREATININE 0.83  CALCIUM 8.0*   Liver Function Tests: No results for input(s): AST, ALT, ALKPHOS, BILITOT, PROT, ALBUMIN in the last 168 hours. No results for input(s): LIPASE, AMYLASE in the last 168 hours. No results for input(s): AMMONIA in the last 168 hours. CBC: Recent Labs  Lab 03/18/21 0349  WBC 11.5*  HGB 11.4*  HCT 35.2*  MCV 96.7  PLT 155   Cardiac Enzymes: No results for input(s): CKTOTAL, CKMB, CKMBINDEX, TROPONINI in the last 168 hours. BNP: Invalid input(s): POCBNP CBG: No results for input(s): GLUCAP in the last 168 hours. D-Dimer No results for input(s): DDIMER in the last 72 hours. Hgb A1c No results for input(s): HGBA1C in the last 72 hours. Lipid Profile No results for input(s): CHOL, HDL, LDLCALC, TRIG, CHOLHDL, LDLDIRECT in the last 72 hours. Thyroid function studies No results for input(s): TSH, T4TOTAL, T3FREE, THYROIDAB in the last 72 hours.  Invalid input(s): FREET3 Anemia work up No results for input(s): VITAMINB12, FOLATE, FERRITIN, TIBC, IRON, RETICCTPCT in the last 72 hours. Urinalysis    Component Value Date/Time   COLORURINE YELLOW 03/13/2021 0253   APPEARANCEUR HAZY (A) 03/13/2021  0253   LABSPEC 1.018 03/13/2021 0253   PHURINE 5.0 03/13/2021 0253   GLUCOSEU NEGATIVE 03/13/2021 0253   HGBUR NEGATIVE 03/13/2021 0253   HGBUR negative 03/01/2009 0853   BILIRUBINUR NEGATIVE 03/13/2021 0253   KETONESUR NEGATIVE 03/13/2021 0253   PROTEINUR NEGATIVE 03/13/2021 0253   UROBILINOGEN 1.0 10/09/2014 2018   NITRITE NEGATIVE 03/13/2021 0253   LEUKOCYTESUR MODERATE (A) 03/13/2021 0253   Sepsis Labs Invalid input(s): PROCALCITONIN,  WBC,  LACTICIDVEN Microbiology Recent Results (from the past 240 hour(s))  Resp Panel by RT-PCR (Flu A&B, Covid) Nasopharyngeal Swab     Status: None   Collection Time: 03/12/21  1:31 PM   Specimen: Nasopharyngeal Swab; Nasopharyngeal(NP) swabs  in vial transport medium  Result Value Ref Range Status   SARS Coronavirus 2 by RT PCR NEGATIVE NEGATIVE Final    Comment: (NOTE) SARS-CoV-2 target nucleic acids are NOT DETECTED.  The SARS-CoV-2 RNA is generally detectable in upper respiratory specimens during the acute phase of infection. The lowest concentration of SARS-CoV-2 viral copies this assay can detect is 138 copies/mL. A negative result does not preclude SARS-Cov-2 infection and should not be used as the sole basis for treatment or other patient management decisions. A negative result may occur with  improper specimen collection/handling, submission of specimen other than nasopharyngeal swab, presence of viral mutation(s) within the areas targeted by this assay, and inadequate number of viral copies(<138 copies/mL). A negative result must be combined with clinical observations, patient history, and epidemiological information. The expected result is Negative.  Fact Sheet for Patients:  EntrepreneurPulse.com.au  Fact Sheet for Healthcare Providers:  IncredibleEmployment.be  This test is no t yet approved or cleared by the [place] FDA and  has been authorized for detection and/or diagnosis of  SARS-CoV-2 by FDA under an Emergency Use Authorization (EUA). This EUA will remain  in effect (meaning this test can be used) for the duration of the COVID-19 declaration under Section 564(b)(1) of the Act, 21 U.S.C.section 360bbb-3(b)(1), unless the authorization is terminated  or revoked sooner.       Influenza A by PCR NEGATIVE NEGATIVE Final   Influenza B by PCR NEGATIVE NEGATIVE Final    Comment: (NOTE) The Xpert Xpress SARS-CoV-2/FLU/RSV plus assay is intended as an aid in the diagnosis of influenza from Nasopharyngeal swab specimens and should not be used as a sole basis for treatment. Nasal washings and aspirates are unacceptable for Xpert Xpress SARS-CoV-2/FLU/RSV testing.  Fact Sheet for Patients: EntrepreneurPulse.com.au  Fact Sheet for Healthcare Providers: IncredibleEmployment.be  This test is not yet approved or cleared by the [place] FDA and has been authorized for detection and/or diagnosis of SARS-CoV-2 by FDA under an Emergency Use Authorization (EUA). This EUA will remain in effect (meaning this test can be used) for the duration of the COVID-19 declaration under Section 564(b)(1) of the Act, 21 U.S.C. section 360bbb-3(b)(1), unless the authorization is terminated or revoked.  Performed at [facility], [place] [address], [place], [place] [address]      Time coordinating discharge: Over 30 minutes  SIGNED:   Zaina Jenkin J [place] DO  [facility] 03/22/2021, 10:40 AM

## 2021-03-22 NOTE — Plan of Care (Signed)
  Problem: Health Behavior/Discharge Planning: Goal: Ability to manage health-related needs will improve Outcome: Progressing   Problem: Clinical Measurements: Goal: Ability to maintain clinical measurements within normal limits will improve Outcome: Progressing Goal: Diagnostic test results will improve Outcome: Progressing Goal: Cardiovascular complication will be avoided Outcome: Progressing   Problem: Activity: Goal: Risk for activity intolerance will decrease Outcome: Progressing   Problem: Nutrition: Goal: Adequate nutrition will be maintained Outcome: Progressing   Problem: Coping: Goal: Level of anxiety will decrease Outcome: Progressing   Problem: Elimination: Goal: Will not experience complications related to bowel motility Outcome: Progressing Goal: Will not experience complications related to urinary retention Outcome: Progressing   Problem: Pain Managment: Goal: General experience of comfort will improve Outcome: Progressing

## 2021-03-22 NOTE — CV Procedure (Addendum)
Brief TEE Note  LVEF 60-65% Mild TR Minimal MR Mild thickening of the right coronary cusp of the aortic valve.  No thrombus or vegetation. No AS or AR No LA/LAA thrombus or mass  For additional details see full report.  Jodi Delair C. Oval Linsey, MD, [facility] 03/22/2021 9:28 AM

## 2021-03-22 NOTE — Progress Notes (Signed)
Pt off floor for procedure

## 2021-03-22 NOTE — Progress Notes (Signed)
Physical Therapy Treatment Patient Details Name: Jodi Bowman MRN: [id number] DOB: October 10, 1960 Today's Date: 03/22/2021    History of Present Illness Pt is a 60 y/o female presenting on 03/12/2021 with weakness and facial droop after sustaining a fall. ED course includes stroke rule-out, negative MRI/CTA. PMH significant for fibromyalgia; s/p gastric bypass (2017); HLD; HTN; OSA; and CVA in 2000. Of note, pt with recent spinal cord stimulator implantation for chronic pain.    PT Comments    Patient eager to return home this date but agreeable to short PT session. Patient ambulating with RW at supervision level for safety due to balance deficits. Patient negotiating 2 stairs with L hand rail and supervision. Patient continues to be limited by generalized weakness, impaired balance, decreased activity tolerance, and impaired cognition. Continue to recommend HHPT following discharge.    Follow Up Recommendations  Home health PT     Equipment Recommendations  None recommended by PT    Recommendations for Other Services       Precautions / Restrictions Precautions Precautions: Other (comment) Precaution Comments: Spinal cord stimulator with chronic back pain Restrictions Weight Bearing Restrictions: No    Mobility  Bed Mobility Overal bed mobility: Modified Independent                  Transfers Overall transfer level: Needs assistance Equipment used: Rolling [place] (2 wheeled) Transfers: Sit to/from Stand Sit to Stand: Supervision         General transfer comment: supervision for safety  Ambulation/Gait Ambulation/Gait assistance: Supervision Gait Distance (Feet): 200 Feet Assistive device: Rolling [place] (2 wheeled) Gait Pattern/deviations: Step-through pattern;Decreased stride length Gait velocity: decreased   General Gait Details: slow, steady gait with RW. No overt LOB noted   Stairs Stairs: Yes Stairs assistance: Supervision Stair Management: One  rail Left;Forwards;Step to pattern Number of Stairs: 2     Wheelchair Mobility    Modified Rankin (Stroke Patients Only)       Balance Overall balance assessment: Mild deficits observed, not formally tested                                          Cognition Arousal/Alertness: Awake/alert Behavior During Therapy: WFL for tasks assessed/performed Overall Cognitive Status: Impaired/Different from baseline Area of Impairment: Orientation;Problem solving                 Orientation Level: Disoriented to;Place           Problem Solving: Slow processing        Exercises      General Comments        Pertinent Vitals/Pain Pain Assessment: No/denies pain    Home Living                      Prior Function            PT Goals (current goals can now be found in the care plan section) Acute Rehab PT Goals Patient Stated Goal: home PT Goal Formulation: With patient Time For Goal Achievement: 03/27/21 Potential to Achieve Goals: Good Progress towards PT goals: Progressing toward goals    Frequency    Min 3X/week      PT Plan Current plan remains appropriate    Co-evaluation              AM-PAC PT "6 Clicks" Mobility   Outcome Measure  Help needed turning from your back to your side while in a flat bed without using bedrails?: None Help needed moving from lying on your back to sitting on the side of a flat bed without using bedrails?: None Help needed moving to and from a bed to a chair (including a wheelchair)?: A Little Help needed standing up from a chair using your arms (e.g., wheelchair or bedside chair)?: A Little Help needed to walk in hospital room?: A Little Help needed climbing 3-5 steps with a railing? : A Little 6 Click Score: 20    End of Session Equipment Utilized During Treatment: Gait belt Activity Tolerance: Patient tolerated treatment well Patient left: in bed;with call bell/phone within  reach;with bed alarm set Nurse Communication: Mobility status PT Visit Diagnosis: Other abnormalities of gait and mobility (R26.89);Muscle weakness (generalized) (M62.81);History of falling (Z91.81);Difficulty in walking, not elsewhere classified (R26.2);Pain     Time: 5789-7847 PT Time Calculation (min) (ACUTE ONLY): 13 min  Charges:  $Therapeutic Activity: 8-22 mins                     Lekesha Claw A. Gilford Rile PT, DPT Acute Rehabilitation Services Pager [phone] Office [phone]    Linna Hoff 03/22/2021, 1:11 PM

## 2021-03-22 NOTE — TOC Transition Note (Signed)
Transition of Care (TOC) - CM/SW Discharge Note Marvetta Gibbons RN, BSN Transitions of Care Unit 4E- RN Case Manager See Treatment Team for direct phone #  Cross coverage for 3W  Patient Details  Name: Jodi Bowman MRN: [id number] Date of Birth: 04-02-1961  Transition of Care [facility]) CM/SW Contact:  Dawayne Patricia, RN Phone Number: 03/22/2021, 12:12 PM   Clinical Narrative:    Pt stable for transition home today, per previous CM note, [facility] has accepted referral for [facility] needs, Confirmed this with Lattie Haw at [facility]. They will follow post discharge for start of care. Order has been placed for HHPT.     Final next level of care: Home w Home Health Services Barriers to Discharge: Barriers Resolved   Patient Goals and CMS Choice   CMS Medicare.gov Compare Post Acute Care list provided to:: Patient Choice offered to / list presented to : Patient  Discharge Placement               Home w/ [facility]        Discharge Plan and Services   Discharge Planning Services: CM Consult Post Acute Care Choice: Home Health                    HH Arranged: PT, OT, Speech Therapy [place] Agency: [facility] Date [place]: 03/22/21 Time [place]: 1210 Representative spoke with at [place]: [place]) Interventions     Readmission Risk Interventions Readmission Risk Prevention Plan 03/22/2021 08/29/2019 03/08/2019  Transportation Screening Complete Complete Complete  PCP or Specialist Appt within 5-7 Days Complete - Complete  PCP or Specialist Appt within 3-5 Days - Complete -  Home Care Screening Complete - Complete  Medication Review (RN CM) Complete - Complete  HRI or Home Care Consult - Complete -  Social Work Consult for [place] Planning/Counseling - Complete -  Palliative Care Screening - Complete -  Medication Review Press photographer) - Complete -  Some recent data might be hidden

## 2021-03-22 NOTE — Transfer of Care (Signed)
Immediate Anesthesia Transfer of Care Note  Patient: Jodi Bowman  Procedure(s) Performed: TRANSESOPHAGEAL ECHOCARDIOGRAM (TEE)  Patient Location: Endoscopy Unit  Anesthesia Type:MAC  Level of Consciousness: sedated, patient cooperative and responds to stimulation  Airway & Oxygen Therapy: Patient Spontanous Breathing and Patient connected to nasal cannula oxygen  Post-op Assessment: Report given to RN, Post -op Vital signs reviewed and stable and Patient moving all extremities  Post vital signs: Reviewed and stable  Last Vitals:  Vitals Value Taken Time  BP 125/58 03/22/21 0933  Temp    Pulse 53 03/22/21 0934  Resp 7 03/22/21 0935  SpO2 93 % 03/22/21 0934  Vitals shown include unvalidated device data.  Last Pain:  Vitals:   03/22/21 0817  TempSrc: Oral  PainSc: 5       Patients Stated Pain Goal: 0 (34/35/68 6168)  Complications: No notable events documented.

## 2021-03-22 NOTE — H&P (Signed)
Jodi Bowman is a 60 y.o. female who has presented today for surgery, with the diagnosis of aortic valve mass.  The various methods of treatment have been discussed with the patient and family. After consideration of risks, benefits and other options for treatment, the patient has consented to  Procedure(s): TRANSESOPHAGEAL ECHOCARDIOGRAM (TEE) (N/A) as a surgical intervention .  The patient's history has been reviewed, patient examined, no change in status, stable for surgery.  I have reviewed the patient's chart and labs.  Questions were answered to the patient's satisfaction.    [name], MD, [facility]  03/22/2021 8:54 AM

## 2021-03-22 NOTE — Progress Notes (Signed)
  Echocardiogram 2D Echocardiogram has been performed.  Jodi Bowman 03/22/2021, 9:41 AM

## 2021-03-22 NOTE — Plan of Care (Signed)
Adequate for discharge.

## 2021-03-25 ENCOUNTER — Encounter (HOSPITAL_COMMUNITY): Payer: Self-pay | Admitting: Cardiovascular Disease

## 2021-03-27 ENCOUNTER — Encounter (HOSPITAL_COMMUNITY): Payer: Self-pay

## 2021-03-27 ENCOUNTER — Emergency Department (HOSPITAL_COMMUNITY): Payer: Medicare (Managed Care)

## 2021-03-27 ENCOUNTER — Other Ambulatory Visit: Payer: Self-pay

## 2021-03-27 ENCOUNTER — Emergency Department (HOSPITAL_COMMUNITY)
Admission: EM | Admit: 2021-03-27 | Discharge: 2021-03-27 | Disposition: A | Discharge status: Home / Self Care | Payer: Medicare (Managed Care) | Attending: Emergency Medicine | Admitting: Emergency Medicine

## 2021-03-27 DIAGNOSIS — R531 Weakness: Secondary | ICD-10-CM

## 2021-03-27 DIAGNOSIS — Z955 Presence of coronary angioplasty implant and graft: Secondary | ICD-10-CM | POA: Diagnosis not present

## 2021-03-27 DIAGNOSIS — R519 Headache, unspecified: Secondary | ICD-10-CM | POA: Diagnosis not present

## 2021-03-27 DIAGNOSIS — Z7982 Long term (current) use of aspirin: Secondary | ICD-10-CM | POA: Insufficient documentation

## 2021-03-27 DIAGNOSIS — I10 Essential (primary) hypertension: Secondary | ICD-10-CM | POA: Insufficient documentation

## 2021-03-27 DIAGNOSIS — Z79899 Other long term (current) drug therapy: Secondary | ICD-10-CM | POA: Diagnosis not present

## 2021-03-27 DIAGNOSIS — R2990 Unspecified symptoms and signs involving the nervous system: Secondary | ICD-10-CM

## 2021-03-27 DIAGNOSIS — Z20822 Contact with and (suspected) exposure to covid-19: Secondary | ICD-10-CM | POA: Insufficient documentation

## 2021-03-27 DIAGNOSIS — I639 Cerebral infarction, unspecified: Secondary | ICD-10-CM | POA: Diagnosis not present

## 2021-03-27 LAB — PROTIME-INR
INR: 1.1 (ref 0.8–1.2)
Prothrombin Time: 14.7 seconds (ref 11.4–15.2)

## 2021-03-27 LAB — CBC
HCT: 35.1 % — ABNORMAL LOW (ref 36.0–46.0)
Hemoglobin: 11.3 g/dL — ABNORMAL LOW (ref 12.0–15.0)
MCH: 31.1 pg (ref 26.0–34.0)
MCHC: 32.2 g/dL (ref 30.0–36.0)
MCV: 96.7 fL (ref 80.0–100.0)
Platelets: 310 10*3/uL (ref 150–400)
RBC: 3.63 MIL/uL — ABNORMAL LOW (ref 3.87–5.11)
RDW: 16.7 % — ABNORMAL HIGH (ref 11.5–15.5)
WBC: 7.9 10*3/uL (ref 4.0–10.5)
nRBC: 0 % (ref 0.0–0.2)

## 2021-03-27 LAB — I-STAT CHEM 8, ED
BUN: 29 mg/dL — ABNORMAL HIGH (ref 8–23)
BUN: 63 mg/dL — ABNORMAL HIGH (ref 8–23)
Calcium, Ion: 0.89 mmol/L — CL (ref 1.15–1.40)
Calcium, Ion: 1.1 mmol/L — ABNORMAL LOW (ref 1.15–1.40)
Chloride: 106 mmol/L (ref 98–111)
Chloride: 108 mmol/L (ref 98–111)
Creatinine, Ser: 0.8 mg/dL (ref 0.44–1.00)
Creatinine, Ser: 7.6 mg/dL — ABNORMAL HIGH (ref 0.44–1.00)
Glucose, Bld: 180 mg/dL — ABNORMAL HIGH (ref 70–99)
Glucose, Bld: 73 mg/dL (ref 70–99)
HCT: 35 % — ABNORMAL LOW (ref 36.0–46.0)
HCT: 37 % (ref 36.0–46.0)
Hemoglobin: 11.9 g/dL — ABNORMAL LOW (ref 12.0–15.0)
Hemoglobin: 12.6 g/dL (ref 12.0–15.0)
Potassium: 4.8 mmol/L (ref 3.5–5.1)
Potassium: 7.9 mmol/L (ref 3.5–5.1)
Sodium: 136 mmol/L (ref 135–145)
Sodium: 140 mmol/L (ref 135–145)
TCO2: 25 mmol/L (ref 22–32)
TCO2: 27 mmol/L (ref 22–32)

## 2021-03-27 LAB — URINALYSIS, ROUTINE W REFLEX MICROSCOPIC
Bilirubin Urine: NEGATIVE
Glucose, UA: NEGATIVE mg/dL
Hgb urine dipstick: NEGATIVE
Ketones, ur: NEGATIVE mg/dL
Leukocytes,Ua: NEGATIVE
Nitrite: NEGATIVE
Protein, ur: NEGATIVE mg/dL
Specific Gravity, Urine: 1.029 (ref 1.005–1.030)
pH: 5 (ref 5.0–8.0)

## 2021-03-27 LAB — COMPREHENSIVE METABOLIC PANEL
ALT: 28 U/L (ref 0–44)
AST: 33 U/L (ref 15–41)
Albumin: 2.4 g/dL — ABNORMAL LOW (ref 3.5–5.0)
Alkaline Phosphatase: 66 U/L (ref 38–126)
Anion gap: 4 — ABNORMAL LOW (ref 5–15)
BUN: 19 mg/dL (ref 8–23)
CO2: 27 mmol/L (ref 22–32)
Calcium: 8.2 mg/dL — ABNORMAL LOW (ref 8.9–10.3)
Chloride: 108 mmol/L (ref 98–111)
Creatinine, Ser: 0.87 mg/dL (ref 0.44–1.00)
GFR, Estimated: >60 mL/min (ref >60)
Glucose, Bld: 77 mg/dL (ref 70–99)
Potassium: 4.3 mmol/L (ref 3.5–5.1)
Sodium: 139 mmol/L (ref 135–145)
Total Bilirubin: 0.6 mg/dL (ref 0.3–1.2)
Total Protein: 5 g/dL — ABNORMAL LOW (ref 6.5–8.1)

## 2021-03-27 LAB — DIFFERENTIAL
Abs Immature Granulocytes: 0.02 10*3/uL (ref 0.00–0.07)
Basophils Absolute: 0 10*3/uL (ref 0.0–0.1)
Basophils Relative: 0 %
Eosinophils Absolute: 0.1 10*3/uL (ref 0.0–0.5)
Eosinophils Relative: 1 %
Immature Granulocytes: 0 %
Lymphocytes Relative: 36 %
Lymphs Abs: 2.9 10*3/uL (ref 0.7–4.0)
Monocytes Absolute: 0.7 10*3/uL (ref 0.1–1.0)
Monocytes Relative: 8 %
Neutro Abs: 4.3 10*3/uL (ref 1.7–7.7)
Neutrophils Relative %: 55 %

## 2021-03-27 LAB — RAPID URINE DRUG SCREEN, HOSP PERFORMED
Amphetamines: NOT DETECTED
Barbiturates: NOT DETECTED
Benzodiazepines: NOT DETECTED
Cocaine: NOT DETECTED
Opiates: POSITIVE — AB
Tetrahydrocannabinol: NOT DETECTED

## 2021-03-27 LAB — CBG MONITORING, ED
Glucose-Capillary: 63 mg/dL — ABNORMAL LOW (ref 70–99)
Glucose-Capillary: 74 mg/dL (ref 70–99)

## 2021-03-27 LAB — APTT: aPTT: 32 seconds (ref 24–36)

## 2021-03-27 LAB — ETHANOL: Alcohol, Ethyl (B): <10 mg/dL (ref <10)

## 2021-03-27 LAB — RESP PANEL BY RT-PCR (FLU A&B, COVID) ARPGX2
Influenza A by PCR: NEGATIVE
Influenza B by PCR: NEGATIVE
SARS Coronavirus 2 by RT PCR: NEGATIVE

## 2021-03-27 MED ORDER — SODIUM CHLORIDE 0.9 % IV BOLUS
1000.0000 mL | Freq: Once | INTRAVENOUS | Status: AC
  Administered 2021-03-27: 1000 mL via INTRAVENOUS

## 2021-03-27 MED ORDER — IOHEXOL 350 MG/ML SOLN
100.0000 mL | Freq: Once | INTRAVENOUS | Status: AC | PRN
  Administered 2021-03-27: 100 mL via INTRAVENOUS

## 2021-03-27 MED ORDER — DIPHENHYDRAMINE HCL 50 MG/ML IJ SOLN
25.0000 mg | Freq: Once | INTRAMUSCULAR | Status: AC
  Administered 2021-03-27: 25 mg via INTRAVENOUS
  Filled 2021-03-27: qty 1

## 2021-03-27 MED ORDER — METOCLOPRAMIDE HCL 5 MG/ML IJ SOLN
10.0000 mg | Freq: Once | INTRAMUSCULAR | Status: AC
  Administered 2021-03-27: 10 mg via INTRAVENOUS
  Filled 2021-03-27: qty 2

## 2021-03-27 NOTE — ED Triage Notes (Signed)
Pt reports waking at 1100am today with dizziness associated with Left arm and leg weakness. Pt reports she went to bed approx 8:30-9pm last and able to move both LUE and LLE   Pt unable to lift her Left arm too high, she can hold it up just cant lift too high, same with Left leg. Slight left side facial droop

## 2021-03-27 NOTE — Consult Note (Addendum)
Neurology Consultation  Reason for Consult: Code stroke left-sided weakness, speech difficulty Referring Physician: Dr. Regenia Skeeter, EDP  CC: Left-sided weakness, speech difficulty  History is obtained from: Patient, chart  HPI: Jodi Bowman is a 60 y.o. female past medical history of fibromyalgia, hypercholesterolemia, hypertension, migraine, prior strokes with unknown residual deficits, recent admission for right-sided weakness with a negative MRI and concern for possible TIA started on dual antiplatelets, chronic pain on opiates as well as possible spinal stimulator presenting for evaluation of acute onset of and evidence of aphasia per the ED provider and asked him to activate a code stroke. Last known well 8:30 PM when she went to bed last night. Upon waking up, found it hard to move the left side of the body.  Also was having difficulty with word finding. Came to the triage via personal vehicle and was evaluated.  I was called to see if she fits the LVO criteria-given this institution uses the [facility], she did have weakness along with aphasia-I recommended we activate a code stroke. Emergently evaluated the patient Exam was inconsistent-see below. Code stroke was canceled.   LKW: 8:30 PM yesterday 03/26/2021 tpa given?: no, outside the window Modified Rankin 2   ROS: Full ROS was performed and is negative except as noted in the HPI.   Past Medical History:  Diagnosis Date   Arthritis    Calculus of gallbladder without mention of cholecystitis or obstruction    Dizziness    Fibromyalgia    GERD (gastroesophageal reflux disease)    Goiter    Hypercholesteremia    Hypertension    Lower back pain    Migraine    Nontoxic uninodular goiter    sees dr vollmer at [facility]   Obesity    PONV (postoperative nausea and vomiting)    Sleep apnea    STOPBANG=5   Stroke ([place]) 2000   Family History  Problem Relation Age of Onset   Diabetes Mother    Hypertension Mother     Transient ischemic attack Mother    Seizures Mother    Dementia Mother    Other Father        MVA   Cancer Brother        colon and lung   Cancer Maternal Grandmother        colon     Social History:   reports that she has never smoked. She has never used smokeless tobacco. She reports previous alcohol use. She reports that she does not use drugs.  Medications No current facility-administered medications for this encounter.  Current Outpatient Medications:    albuterol (VENTOLIN HFA) 108 (90 Base) MCG/ACT inhaler, Inhale 1-2 puffs into the lungs every 6 (six) hours as needed for wheezing or shortness of breath., Disp: , Rfl:    aspirin EC 81 MG tablet, Take 81 mg by mouth daily., Disp: , Rfl:    clopidogrel (PLAVIX) 75 MG tablet, Take 1 tablet (75 mg total) by mouth daily., Disp: 30 tablet, Rfl: 2   D3-50 1.25 MG (50000 UT) capsule, Take 50,000 Units by mouth 3 (three) times a week., Disp: , Rfl:    diclofenac Sodium (VOLTAREN) 1 % GEL, Apply 4 g topically 2 (two) times daily as needed (back pain)., Disp: , Rfl:    DULoxetine (CYMBALTA) 60 MG capsule, Take 60 mg by mouth daily., Disp: , Rfl:    escitalopram (LEXAPRO) 10 MG tablet, Take 10 mg by mouth at bedtime., Disp: , Rfl:    ferrous  sulfate 325 (65 FE) MG EC tablet, Take 325 mg by mouth daily., Disp: , Rfl:    fluticasone (FLONASE) 50 MCG/ACT nasal spray, Place 1 spray into both nostrils daily as needed for allergies. , Disp: , Rfl: 0   folic acid (FOLVITE) 1 MG tablet, Take 1 mg by mouth daily., Disp: , Rfl: 11   HYDROcodone-acetaminophen (NORCO) 10-325 MG tablet, Take 1 tablet by mouth 2 (two) times daily as needed for moderate pain or severe pain., Disp: , Rfl:    isosorbide mononitrate (IMDUR) 30 MG 24 hr tablet, Take 1 tablet (30 mg total) by mouth daily., Disp: 30 tablet, Rfl: 0   loratadine (CLARITIN) 10 MG tablet, Take 10 mg by mouth daily. , Disp: , Rfl:    Magnesium Oxide 400 MG CAPS, Take 1 capsule (400 mg total) by  mouth daily., Disp: 15 capsule, Rfl: 0   metoprolol succinate (TOPROL-XL) 100 MG 24 hr tablet, Take 100 mg by mouth at bedtime. , Disp: , Rfl:    mirtazapine (REMERON) 7.5 MG tablet, Take 7.5 mg by mouth at bedtime., Disp: , Rfl:    morphine (MS CONTIN) 30 MG 12 hr tablet, Take 30 mg by mouth every 12 (twelve) hours., Disp: , Rfl:    ondansetron (ZOFRAN-ODT) 4 MG disintegrating tablet, Take 4 mg by mouth every 8 (eight) hours as needed for nausea/vomiting., Disp: , Rfl:    pantoprazole (PROTONIX) 40 MG tablet, Take 1 tablet (40 mg total) by mouth 2 (two) times daily., Disp: 60 tablet, Rfl: 0   polyethylene glycol (MIRALAX / GLYCOLAX) 17 g packet, Take 17 g by mouth daily as needed for mild constipation., Disp: 14 each, Rfl: 0   pregabalin (LYRICA) 75 MG capsule, Take 1 capsule (75 mg total) by mouth 3 (three) times daily., Disp: 90 capsule, Rfl: 0   spironolactone (ALDACTONE) 25 MG tablet, Take 25 mg by mouth daily., Disp: , Rfl:   Exam: Current vital signs: BP 138/75 (BP Location: Right Arm)   Pulse (!) 56   Temp 98.2 F (36.8 C) (Oral)   Resp 14   SpO2 100%  Vital signs in last 24 hours: Temp:  [98.2 F (36.8 C)] 98.2 F (36.8 C) (06/29 1355) Pulse Rate:  [56] 56 (06/29 1355) Resp:  [14] 14 (06/29 1355) BP: (138)/(75) 138/75 (06/29 1355) SpO2:  [100 %] 100 % (06/29 1355) General: Awake alert in no distress but a lot of pain on multiple spots. HEENT: Normocephalic/atraumatic CVS: Regular rate rhythm Lungs: Clear Extremities warm well perfused Neurological exam Awake alert oriented x3 Speech is mildly dysarthric Has mild word finding difficulty-almost volitional stopping and stuttering at times. Cranial examination with pupils equal round reactive to light, extraocular movements intact, no visual field deficits, diminished sensation to all modalities on left face including splitting tuning fork on the left forehead.  Tongue and palate midline.  Questionable almost volitional  appearing slight drying of the left mouth downward. Motor examination: Inconsistent trend throughout the testing with off-and-on giveaway weakness and resistance on the left side more than right. Sensory exam: Inconsistent with sharp splitting of diminished sensation of the left hemibody in the midline. Coordination: Again inconsistent examination with left worse than right. - Stroke scale 1a Level of Conscious.: 0 1b LOC Questions: 2 1c LOC Commands: 0 2 Best Gaze: 0 3 Visual: 0 4 Facial Palsy: 1 5a Motor Arm - left: 1 5b Motor Arm - Right: 1 6a Motor Leg - Left: 1 6b Motor Leg - Right: 1  7 Limb Ataxia: 0 8 Sensory: 2 9 Best Language: 1 10 Dysarthria: 1 11 Extinct. and Inatten.: 0 TOTAL: 10   Labs I have reviewed labs in epic and the results pertinent to this consultation are:   CBC    Component Value Date/Time   WBC 7.9 03/27/2021 1421   RBC 3.63 (L) 03/27/2021 1421   HGB 11.9 (L) 03/27/2021 1429   HCT 35.0 (L) 03/27/2021 1429   HCT 25.6 (L) 08/26/2019 0406   PLT 310 03/27/2021 1421   MCV 96.7 03/27/2021 1421   MCH 31.1 03/27/2021 1421   MCHC 32.2 03/27/2021 1421   RDW 16.7 (H) 03/27/2021 1421   LYMPHSABS 2.9 03/27/2021 1421   MONOABS 0.7 03/27/2021 1421   EOSABS 0.1 03/27/2021 1421   BASOSABS 0.0 03/27/2021 1421    CMP     Component Value Date/Time   NA 136 03/27/2021 1429   K 7.9 (HH) 03/27/2021 1429   CL 106 03/27/2021 1429   CO2 29 03/18/2021 0349   GLUCOSE 180 (H) 03/27/2021 1429   BUN 63 (H) 03/27/2021 1429   CREATININE 7.60 (H) 03/27/2021 1429   CALCIUM 8.0 (L) 03/18/2021 0349   PROT 5.5 (L) 03/12/2021 1331   ALBUMIN 2.5 (L) 03/12/2021 1331   AST 36 03/12/2021 1331   ALT 29 03/12/2021 1331   ALKPHOS 72 03/12/2021 1331   BILITOT 0.6 03/12/2021 1331   GFRNONAA >60 03/18/2021 0349   GFRAA >60 06/30/2020 0144    Lipid Panel     Component Value Date/Time   CHOL 104 03/12/2021 1933   TRIG 21 03/12/2021 1933   HDL 68 03/12/2021 1933   CHOLHDL  1.5 03/12/2021 1933   VLDL 4 03/12/2021 1933   [place] 32 03/12/2021 1933     Imaging I have reviewed the images obtained: CT-scan of the brain-as aspects 10, no bleed CTA head and neck with no emergent LVO, CT perfusion with no perfusion deficit   Assessment: 60 year old with past history of stroke hypertension migraine gastric bypass hypercholesterolemia hypertension fibromyalgia presenting with left-sided weakness with inconsistent exam as documented above. Recently was evaluated for inconsistent right-sided weakness and a TIA risk factor work-up was completed and was negative for any acute process.  She was started on dual antiplatelets and discharged with outpatient follow-up recommendations. Today her symptoms are on the left side and again remain inconsistent with giveaway weakness and volitional appearing varying degrees of resistance and strength weakness. Recently had an MRI done which was unremarkable. Also had vitamin B12 deficiency-that is being repleted. At this point, I do not think that I have a strong suspicion for stroke and there is a need for an MRI. Polypharmacy might also be contributing  Impression: Inconsistent generalized weakness with functional component of the weakness more predominantly on the left than right side. Evaluate for underlying infection that might be causing some sort of encephalopathy Polypharmacy  Recommendations: Urinalysis Urinary toxicology screen Minimize sedating medications Continue dual antiplatelets Outpatient neurology follow-up as previously advised No further neurological work-up at this time as an inpatient. Plan discussed with Dr. Verner Chol. Please call neurology with questions or needed  -- Amie Portland, MD Neurologist [facility] Pager: [phone]

## 2021-03-27 NOTE — ED Provider Notes (Signed)
I see the patient in signout from Dr. Regenia Skeeter, briefly the patient is a 60 year old female who arrived as a code stroke.  She had a inconsistent exam and work-up thus far has been unremarkable.  Neurology was recommending that the patient have a UA prior to discharge.  UA is resulted and is negative for infection.  We will discharge the patient home.  Given neurology follow-up.   Deno Etienne, DO 03/27/21 [id number]

## 2021-03-27 NOTE — ED Provider Notes (Addendum)
[facility] EMERGENCY DEPARTMENT Provider Note   CSN: [id number] Arrival date & time: 03/27/21  1321  An emergency department physician performed an initial assessment on this suspected stroke patient at 73.  History Chief Complaint  Patient presents with   Dizziness   Weakness    Jodi Bowman is a 60 y.o. female.  HPI 60 year old female presents with acute left-sided weakness and difficulty talking.  Last normal was reported last night around 8:30 PM.  Woke up at 11 AM and she had weakness.  Called a code stroke in triage.  After initial code stroke assessment, further talking to patient indicates that she has had a headache since yesterday.  She has a history of migraines but this is more severe.  It is a frontal headache that is throbbing/pulsating.  She developed the left-sided weakness around 11 AM.  No fevers or neck stiffness.  Past Medical History:  Diagnosis Date   Arthritis    Calculus of gallbladder without mention of cholecystitis or obstruction    Dizziness    Fibromyalgia    GERD (gastroesophageal reflux disease)    Goiter    Hypercholesteremia    Hypertension    Lower back pain    Migraine    Nontoxic uninodular goiter    sees dr vollmer at [employer]   Obesity    PONV (postoperative nausea and vomiting)    Sleep apnea    STOPBANG=5   Stroke [facility]) 2000    Patient Active Problem List   Diagnosis Date Noted   Dysphagia    Mood disorder ([place]) 03/12/2021   Weakness 08/27/2020   Mental status change resolved 08/27/2020   Weakness of left side of body 06/29/2020   ACS (acute coronary syndrome) ([place]) 08/25/2019   Chest pain 08/25/2019   Postoperative seroma involving nervous system after nervous system procedure 04/05/2019   Drainage from wound 04/02/2019   Infective otitis externa of left ear    Left otitis media    Urinary retention    Orthostasis    Acute blood loss anemia    Fibromyalgia    Chronic pain syndrome     Lumbar radiculopathy 03/08/2019   Orthostatic hypotension 03/06/2019   Postoperative urinary retention 03/06/2019   Lumbar foraminal stenosis 03/04/2019   S/P exploratory laparotomy 03/06/2018   Bowel obstruction ([place]) 03/06/2018   Chest pain, rule out acute myocardial infarction 12/17/2017   Hypokalemia 12/17/2017   Acute lower UTI 12/17/2017   Vertigo 04/30/2017   Small vessel disease, cerebrovascular 04/30/2017   Chronic low back pain 08/01/2015   Right hip pain 08/01/2015   Abnormality of gait 08/01/2015   Left leg weakness    Low back pain with radiation    Syncope 07/30/2014   Atypical chest pain 03/12/2014   Lap chole IOC April 2013 01/29/2012   Gallstones 12/04/2011   Thyroid nodule-non neoplastic goiter by needle aspiration 12/04/2011   GLUCOSE INTOLERANCE 03/12/2010   DYSLIPIDEMIA 03/12/2010   Chronic migraine 03/12/2010   Carotid stenosis 03/12/2010   CEREBROVASCULAR ACCIDENT 03/12/2010   LIPOMA 01/29/2010   HEADACHE 01/29/2010   ANKLE INJURY, RIGHT 04/12/2009   PHARYNGITIS 03/21/2009   Backache 03/01/2009   ALLERGIC RHINITIS 03/17/2007   LOW BACK PAIN 03/17/2007   Essential hypertension 01/01/2007   ANXIETY STATE NOS 09/11/2005    Past Surgical History:  Procedure Laterality Date   ABDOMINAL HYSTERECTOMY     BOWEL RESECTION N/A 03/06/2018   Procedure: SMALL BOWEL ANASTAMOSIS;  Surgeon: Clovis Riley, MD;  Location: [facility];  Service: General;  Laterality: N/A;   CATARACT EXTRACTION W/ INTRAOCULAR LENS IMPLANT Bilateral    CESAREAN SECTION  yrs ago   done x 2   CHOLECYSTECTOMY  01/05/2012   Procedure: LAPAROSCOPIC CHOLECYSTECTOMY WITH INTRAOPERATIVE CHOLANGIOGRAM;  Surgeon: Pedro Earls, MD;  Location: [facility];  Service: General;  Laterality: N/A;   COLONOSCOPY  10/08/2012   Procedure: COLONOSCOPY;  Surgeon: Beryle Beams, MD;  Location: [facility];  Service: Endoscopy;  Laterality: N/A;   KNEE ARTHROSCOPY  one 1995 and 1 in 1997   both knees done    LAPAROSCOPY N/A 03/06/2018   Procedure: LAPAROSCOPY DIAGNOSTIC WITH LYSIS OF ADHESIONS;  Surgeon: Clovis Riley, MD;  Location: [facility] [facility];  Service: General;  Laterality: N/A;   LAPAROTOMY N/A 03/06/2018   Procedure: EXPLORATORY LAPAROTOMY, RESECTION OF DISTAL ROUX, CLOSURE OF INTERNAL HERNIA;  Surgeon: Clovis Riley, MD;  Location: [facility];  Service: General;  Laterality: N/A;   LEFT HEART CATH AND CORONARY ANGIOGRAPHY N/A 08/29/2019   Procedure: LEFT HEART CATH AND CORONARY ANGIOGRAPHY;  Surgeon: Charolette Forward, MD;  Location: [facility];  Service: Cardiovascular;  Laterality: N/A;   LUMBAR WOUND DEBRIDEMENT N/A 04/03/2019   Procedure: LUMBAR WOUND WASHOUT;  Surgeon: Judith Part, MD;  Location: [facility];  Service: Neurosurgery;  Laterality: N/A;   POSTERIOR LUMBAR FUSION  03/04/2019   surgery for endometriosis  yrs ago   TEE WITHOUT CARDIOVERSION N/A 03/22/2021   Procedure: TRANSESOPHAGEAL ECHOCARDIOGRAM (TEE);  Surgeon: Skeet Latch, MD;  Location: [facility] ENDOSCOPY;  Service: Cardiovascular;  Laterality: N/A;   thryoid biopsy  December 01, 2011    at [facility]     OB History   No obstetric history on file.     Family History  Problem Relation Age of Onset   Diabetes Mother    Hypertension Mother    Transient ischemic attack Mother    Seizures Mother    Dementia Mother    Other Father        MVA   Cancer Brother        colon and lung   Cancer Maternal Grandmother        colon    Social History   Tobacco Use   Smoking status: Never   Smokeless tobacco: Never  Vaping Use   Vaping Use: Never used  Substance Use Topics   Alcohol use: Not Currently    Alcohol/week: 0.0 standard drinks   Drug use: No    Home Medications Prior to Admission medications   Medication Sig Start Date End Date Taking? Authorizing Provider  albuterol (VENTOLIN HFA) 108 (90 Base) MCG/ACT inhaler Inhale 1-2 puffs into the lungs every 6 (six) hours as needed for wheezing or shortness of breath.     [provider]  aspirin EC 81 MG tablet Take 81 mg by mouth daily.    [provider]  clopidogrel (PLAVIX) 75 MG tablet Take 1 tablet (75 mg total) by mouth daily. 03/22/21 06/20/21  [place] Eric J, DO  D3-50 1.25 MG (50000 UT) capsule Take 50,000 Units by mouth 3 (three) times a week. 05/06/20   [provider]  diclofenac Sodium (VOLTAREN) 1 % GEL Apply 4 g topically 2 (two) times daily as needed (back pain).    [provider]  DULoxetine (CYMBALTA) 60 MG capsule Take 60 mg by mouth daily. 08/11/19   [provider]  escitalopram (LEXAPRO) 10 MG tablet Take 10 mg by mouth at  bedtime. 05/04/20   [provider]  ferrous sulfate 325 (65 FE) MG EC tablet Take 325 mg by mouth daily. 07/26/19   [provider]  fluticasone (FLONASE) 50 MCG/ACT nasal spray Place 1 spray into both nostrils daily as needed for allergies.  11/08/17   [provider]  folic acid (FOLVITE) 1 MG tablet Take 1 mg by mouth daily. 10/14/17   [provider]  HYDROcodone-acetaminophen (NORCO) 10-325 MG tablet Take 1 tablet by mouth 2 (two) times daily as needed for moderate pain or severe pain. 02/16/20   [provider]  isosorbide mononitrate (IMDUR) 30 MG 24 hr tablet Take 1 tablet (30 mg total) by mouth daily. 11/18/19   Corena Herter, PA-C  loratadine (CLARITIN) 10 MG tablet Take 10 mg by mouth daily.     [provider]  Magnesium Oxide 400 MG CAPS Take 1 capsule (400 mg total) by mouth daily. 09/28/19   Carlisle Cater, PA-C  metoprolol succinate (TOPROL-XL) 100 MG 24 hr tablet Take 100 mg by mouth at bedtime.  07/10/19   [provider]  mirtazapine (REMERON) 7.5 MG tablet Take 7.5 mg by mouth at bedtime. 07/30/20   [provider]  morphine (MS CONTIN) 30 MG 12 hr tablet Take 30 mg by mouth every 12 (twelve) hours. 06/14/20   [provider]  ondansetron (ZOFRAN-ODT) 4 MG disintegrating tablet Take 4 mg  by mouth every 8 (eight) hours as needed for nausea/vomiting. 06/07/20   [provider]  pantoprazole (PROTONIX) 40 MG tablet Take 1 tablet (40 mg total) by mouth 2 (two) times daily. 03/15/19   Angiulli, Lavon Paganini, PA-C  polyethylene glycol (MIRALAX / GLYCOLAX) 17 g packet Take 17 g by mouth daily as needed for mild constipation. 03/08/19   Viona Gilmore D, NP  pregabalin (LYRICA) 75 MG capsule Take 1 capsule (75 mg total) by mouth 3 (three) times daily. 03/15/19   Angiulli, Lavon Paganini, PA-C  spironolactone (ALDACTONE) 25 MG tablet Take 25 mg by mouth daily.    [provider]  metoprolol (LOPRESSOR) 50 MG tablet Take 50 mg by mouth 2 (two) times daily.    12/04/11  [provider]  simvastatin (ZOCOR) 20 MG tablet Take 20 mg by mouth at bedtime.    12/04/11  [provider]    Allergies    Shrimp [shellfish allergy], Naproxen, and Shellfish-derived products  Review of Systems   Review of Systems  Constitutional:  Negative for fever.  Musculoskeletal:  Negative for neck pain.  Neurological:  Positive for weakness and headaches.  All other systems reviewed and are negative.  Physical Exam Updated Vital Signs BP (!) 146/124   Pulse 73   Temp 98.2 F (36.8 C) (Oral)   Resp 14   SpO2 100%   Physical Exam Vitals and nursing note reviewed.  Constitutional:      General: She is not in acute distress.    Appearance: She is well-developed. She is not ill-appearing or diaphoretic.  HENT:     Head: Normocephalic and atraumatic.     Right Ear: External ear normal.     Left Ear: External ear normal.     Nose: Nose normal.  Eyes:     General:        Right eye: No discharge.        Left eye: No discharge.     Extraocular Movements: Extraocular movements intact.     Pupils: Pupils are equal, round, and reactive to  light.  Cardiovascular:     Rate and Rhythm: Normal rate and regular rhythm.     Heart sounds: Normal heart sounds.  Pulmonary:     Effort:  Pulmonary effort is normal.     Breath sounds: Normal breath sounds.  Abdominal:     Palpations: Abdomen is soft.     Tenderness: There is no abdominal tenderness.  Musculoskeletal:     Cervical back: Normal range of motion and neck supple. No rigidity.  Skin:    General: Skin is warm and dry.  Neurological:     Mental Status: She is alert and oriented to person, place, and time.     Comments: No facial droop or slurred speech.  Currently after the initial CT scan she is talking to me clearly, which is different than earlier.  Left arm and leg are mildly weak compared to the right though the exam is inconsistent, especially when testing for pronator drift.  Psychiatric:        Mood and Affect: Mood is not anxious.    ED Results / Procedures / Treatments   Labs (all labs ordered are listed, but only abnormal results are displayed) Labs Reviewed  CBC - Abnormal; Notable for the following components:      Result Value   RBC 3.63 (*)    Hemoglobin 11.3 (*)    HCT 35.1 (*)    RDW 16.7 (*)    All other components within normal limits  COMPREHENSIVE METABOLIC PANEL - Abnormal; Notable for the following components:   Calcium 8.2 (*)    Total Protein 5.0 (*)    Albumin 2.4 (*)    Anion gap 4 (*)    All other components within normal limits  I-STAT CHEM 8, ED - Abnormal; Notable for the following components:   BUN 29 (*)    Calcium, Ion 1.10 (*)    All other components within normal limits  CBG MONITORING, ED - Abnormal; Notable for the following components:   Glucose-Capillary 63 (*)    All other components within normal limits  I-STAT CHEM 8, ED - Abnormal; Notable for the following components:   Potassium 7.9 (*)    BUN 63 (*)    Creatinine, Ser 7.60 (*)    Glucose, Bld 180 (*)    Calcium, Ion 0.89 (*)    Hemoglobin 11.9 (*)    HCT 35.0 (*)    All other components within normal limits  RESP PANEL BY RT-PCR (FLU A&B, COVID) ARPGX2  ETHANOL  DIFFERENTIAL  PROTIME-INR  APTT   RAPID URINE DRUG SCREEN, HOSP PERFORMED  URINALYSIS, ROUTINE W REFLEX MICROSCOPIC  CBG MONITORING, ED    EKG None  Radiology CT HEAD CODE STROKE WO CONTRAST  Result Date: 03/27/2021 CLINICAL DATA:  Code stroke. Neuro deficit, acute, stroke suspected. Additional history obtained from [place] arm and leg weakness. EXAM: CT HEAD WITHOUT CONTRAST TECHNIQUE: Contiguous axial images were obtained from the base of the skull through the vertex without intravenous contrast. COMPARISON:  Brain MRI 03/14/2021. Noncontrast head CT and CT angiogram head/neck 03/12/2021. FINDINGS: Brain: Cerebral volume is normal for age. There is no acute intracranial hemorrhage. No demarcated cortical infarct. No extra-axial fluid collection. No evidence of an intracranial mass. No midline shift. Partially empty sella turcica. Vascular: No hyperdense vessel.  Atherosclerotic calcifications. Skull: Normal. Negative for fracture or focal lesion. Sinuses/Orbits: Visualized orbits show no acute finding. No significant paranasal sinus disease at the imaged levels. ASPECTS [facility] Stroke Program  Early CT Score) - Ganglionic level infarction (caudate, lentiform nuclei, internal capsule, insula, M1-M3 cortex): 7 - Supraganglionic infarction (M4-M6 cortex): 3 Total score (0-10 with 10 being normal): 10 These results were communicated to Dr. Rory Percy At 2:40 pmon 6/29/2022by text page via the [facility] messaging system. IMPRESSION: No evidence of acute intracranial abnormality.  ASPECTS is 10. Electronically Signed   By: Kellie Simmering DO   On: 03/27/2021 14:43   CT ANGIO HEAD NECK W WO CM W PERF (CODE STROKE)  Result Date: 03/27/2021 CLINICAL DATA:  Stroke follow-up.  Left-sided weakness. EXAM: CT ANGIOGRAPHY HEAD AND NECK CT PERFUSION BRAIN TECHNIQUE: Multidetector CT imaging of the head and neck was performed using the standard protocol during bolus administration of intravenous contrast. Multiplanar CT image  reconstructions and MIPs were obtained to evaluate the vascular anatomy. Carotid stenosis measurements (when applicable) are obtained utilizing NASCET criteria, using the distal internal carotid diameter as the denominator. Multiphase CT imaging of the brain was performed following IV bolus contrast injection. Subsequent parametric perfusion maps were calculated using RAPID software. CONTRAST:  175mL OMNIPAQUE IOHEXOL 350 MG/ML SOLN COMPARISON:  Same day CT head code stroke. CTA March 12, 2021. FINDINGS: CTA NECK: Aortic arch: Great vessel origins are patent. Right carotid system: No evidence of dissection, stenosis (50% or greater) or occlusion. Mild atherosclerosis at the bifurcation. Left carotid system: No evidence of dissection, stenosis (50% or greater) or occlusion. Mild atherosclerosis at the bifurcation. Vertebral arteries: Right dominant. No evidence of dissection, stenosis (50% or greater) or occlusion. Skeleton: Mild multilevel degenerative change of the cervical spine. Other neck: Multinodular thyroid with previous biopsy on the right in 2015. Upper chest: Visualized lung apices are clear. Review of the MIP images confirms the above findings CTA HEAD FINDINGS Anterior circulation: Patent ICAs and MCAs without evidence of a proximal flow limiting stenosis. Limited evaluation distally due to motion. No aneurysm identified. Posterior circulation: Bilateral intradural vertebral arteries, basilar artery, posterior cerebral arteries are patent without proximal flow limiting stenosis. No aneurysm identified. Review of the MIP images confirms the above findings CT Brain Perfusion Findings: ASPECTS: Venous sinuses patent as love by contrast bolus timing. CBF (<30%) Volume: 63mL Perfusion (Tmax>6.0s) volume: 48mL Mismatch Volume: 16mL Infarction Location:None IMPRESSION: 1. No evidence of large vessel occlusion or proximal flow limiting stenosis. Distal intracranial evaluation is limited by motion. 2. No evidence of  core infarct or penumbra on perfusion. Electronically Signed   By: Margaretha Sheffield MD   On: 03/27/2021 15:15    Procedures Procedures   Medications Ordered in ED Medications  iohexol (OMNIPAQUE) 350 MG/ML injection 100 mL (100 mLs Intravenous Contrast Given 03/27/21 1451)  sodium chloride 0.9 % bolus 1,000 mL (1,000 mLs Intravenous New Bag/Given 03/27/21 1513)  metoCLOPramide (REGLAN) injection 10 mg (10 mg Intravenous Given 03/27/21 1528)  diphenhydrAMINE (BENADRYL) injection 25 mg (25 mg Intravenous Given 03/27/21 1527)    ED Course  I have reviewed the triage vital signs and the nursing notes.  Pertinent labs & imaging results that were available during my care of the patient were reviewed by me and considered in my medical decision making (see chart for details).  Clinical Course as of 03/27/21 1619  Wed Mar 27, 2021  1422 I spoek with neurology who reccomends activating code stroke.  [EH]  1424 Charge aware, will activate code stroke.  To CT scan 3.  Text paged neuro Dr. With that information.   [EH]  1427 CBG monitoring, ED(!) Neuro MD Rory Percy at bed  side.  Aware of hypoglycemia.  [EH]  47 Spoke with charge, they will send patients nurse to CT scan as soon as possible.  [EH]  1436 I spoke with Dr. Regenia Skeeter to inform him of patient.  [EH]    Clinical Course User Index [EH] Ollen Gross   MDM Rules/Calculators/A&P                          Patient presents with inconsistent exam as a code stroke.  Code stroke canceled after initial evaluation by neurology which included CT, CT head and neck angiography, and CT perfusion, all of which are benign.  At this point, neurology does not recommend MRI but make sure urine is clear and give supportive care.  She is having a headache and perhaps this could be complicated migraine so we will give her headache medicine.  Urinalysis currently pending.  Care transferred to Dr. Tyrone Nine.  Of note, her i-STAT Chem-8 initially came back  with a potassium of 7.9 and a creatinine of 7.6.  This was immediately rerun with more normal values afterwards.  It seems that this was a false value.  CMP confirms no renal failure.  Glucose has been low normal but not hypoglycemic to suggest an obvious cause for weakness. Final Clinical Impression(s) / ED Diagnoses Final diagnoses:  None    Rx / DC Orders ED Discharge Orders     None        Sherwood Gambler, MD 03/27/21 1607    Sherwood Gambler, MD 03/27/21 1620

## 2021-03-27 NOTE — Discharge Instructions (Addendum)
Your imaging did not show a stroke.  Follow up with the neurologist in the office.  Return for worsening symptoms.

## 2021-03-27 NOTE — Code Documentation (Signed)
Pt is 60 yr old female with multiple medical problems who presents to ED today with left sided weakness and aphasia. Pt was last known well last night at 2030 when she went to bed. She noted to be dizzy and have left sided weakness this morning when she got up at 1100. Pt arrived at [facility] at 1351. When EDP noted pt having weakness and aphasia, code stroke was initiated at 1428. Pt was taken to CT at 1430 and met by neurologist. CT head neg for hemorrhage per Dr Rory Percy. IV access obtained and pt re-examined. Pt drowsy, weak on left, and having difficulty speaking intermittently. CTA and perfusion obtained. Exams negative for LVO and ischemic core or penumbra. Pt returned to ED room 13 to continue workup. Code stroke cancelled at 1453. Bedside handoff with EDRN, Camryn.

## 2021-03-27 NOTE — ED Provider Notes (Signed)
Emergency Medicine Provider Triage Evaluation Note  Jodi Bowman , a 60 y.o. female  was evaluated in triage.  Pt complains of weakness.  She states that she went to bed at about 830 last night and was neurovascularly intact.  She states that when she woke up at about 11 this morning she has weakness in her left arm and leg.  She denies any new pains..  Review of Systems  Positive: Difficulty speaking, weakness in left arm and leg. Negative: Fevers, chills  Physical Exam  BP 138/75 (BP Location: Right Arm)   Pulse (!) 56   Temp 98.2 F (36.8 C) (Oral)   Resp 14   SpO2 100%  Gen:   Awake, no distress   Resp:  Normal effort  MSK:   Decreased strength left arm and leg. Other:  Left-sided facial droop.  Speech is slightly slurred.  Patient is unable to name objects, including phone, pen, and blanket.  She has a difficult time repeating the phrase you can teach an old dog new tricks, drops words and has a difficult time remembering it.  Medical Decision Making  Medically screening exam initiated at 2:30 PM.  Appropriate orders placed.  Jodi Bowman was informed that the remainder of the evaluation will be completed by another provider, this initial triage assessment does not replace that evaluation, and the importance of remaining in the ED until their evaluation is complete.  Patient has a potential life-threatening illness.  She was not sent back to the lobby, she was immediately taken to CT scan by Neuro MD.   Clinical Course as of 03/27/21 1436  Wed Mar 27, 2021  1422 I spoek with neurology who reccomends activating code stroke.  [EH]  1424 Charge aware, will activate code stroke.  To CT scan 3.  Text paged neuro Dr. With that information.   [EH]  1427 CBG monitoring, ED(!) Neuro MD Rory Percy at bed side.  Aware of hypoglycemia.  [EH]  4 Spoke with charge, they will send patients nurse to CT scan as soon as possible.  [EH]  1436 I spoke with Dr. Regenia Skeeter to inform him of  patient.  [EH]    Clinical Course User Index [EH] Jodi Bowman, Jodi Bowman   Note: Portions of this report may have been transcribed using voice recognition software. Every effort was made to ensure accuracy; however, inadvertent computerized transcription errors may be present     Jodi Bowman, Jodi Bowman 03/27/21 1436    Sherwood Gambler, MD 03/30/21 1517

## 2021-03-27 NOTE — ED Notes (Signed)
Per Regenia Skeeter, MD, pt okay to eat once pt passed swallow screen. Pt passed swallow screen, given [place] sandwich and coke.

## 2021-03-27 NOTE — ED Notes (Signed)
Pt verbalizes understanding of discharge instructions. Opportunity for questions and answers were provided. Pt discharged from the ED.   ?

## 2021-04-03 NOTE — Anesthesia Postprocedure Evaluation (Signed)
Anesthesia Post Note  Patient: Jodi Bowman  Procedure(s) Performed: TRANSESOPHAGEAL ECHOCARDIOGRAM (TEE)     Patient location during evaluation: PACU Anesthesia Type: MAC Level of consciousness: awake and alert Pain management: pain level controlled Vital Signs Assessment: post-procedure vital signs reviewed and stable Respiratory status: spontaneous breathing, nonlabored ventilation and respiratory function stable Cardiovascular status: stable and blood pressure returned to baseline Anesthetic complications: no   No notable events documented.  Last Vitals:  Vitals:   03/22/21 1000 03/22/21 1023  BP: (!) 119/39 130/70  Pulse: 72 (!) 59  Resp: 13 14  Temp:  36.8 C  SpO2: 100%     Last Pain:  Vitals:   03/22/21 1023  TempSrc: Oral  PainSc:    Pain Goal: Patients Stated Pain Goal: 0 (03/18/21 1946)                 Audry Pili

## 2021-04-04 ENCOUNTER — Encounter: Payer: Self-pay | Admitting: Neurology

## 2021-04-04 IMAGING — CT CT HEAD W/O CM
4 series · 14 of 47 positions shown, 16 images · non-contrast
Comparison: [DATE] [DATE] [DATE]. [DATE] [DATE], [DATE]

CLINICAL DATA: Trauma

EXAM:
CT HEAD WITHOUT CONTRAST
CT CERVICAL SPINE WITHOUT CONTRAST
TECHNIQUE: Multidetector CT imaging of the head and cervical spine was
performed following the standard protocol without intravenous
contrast. Multiplanar CT image reconstructions of the cervical spine
were also generated.

[Series 3: head without · axial · non-contrast · 0.45mm/px · z∈[-128,-28]mm · 6 of 30 slices shown, 8 images]
[im 5/30  brain]
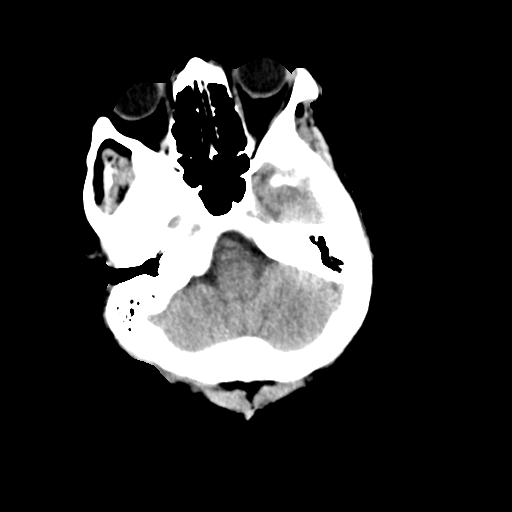
[im 5/30  bone]
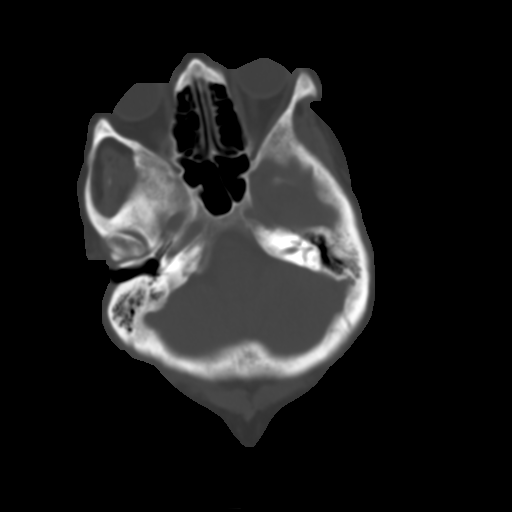
[im 9/30  brain]
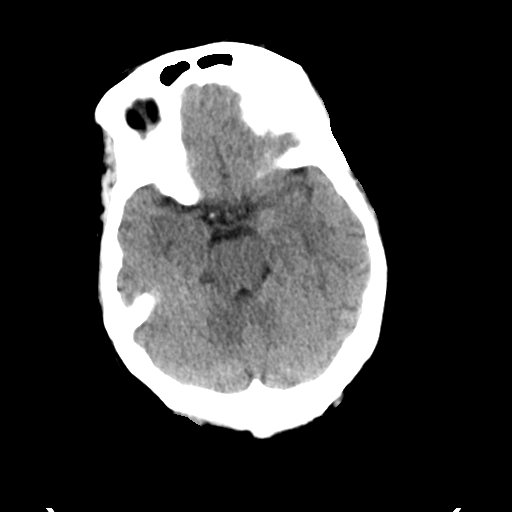
[im 13/30  brain]
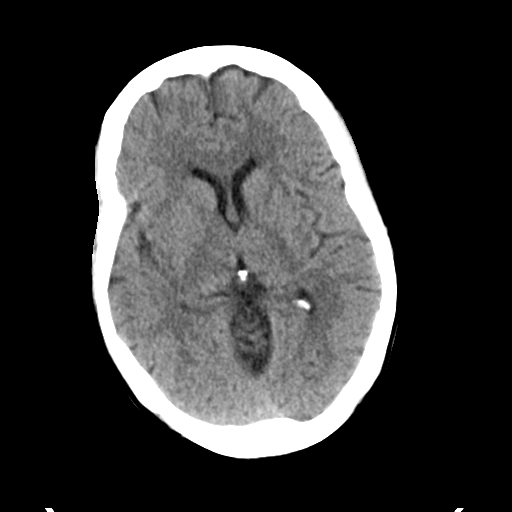
[im 17/30  brain]
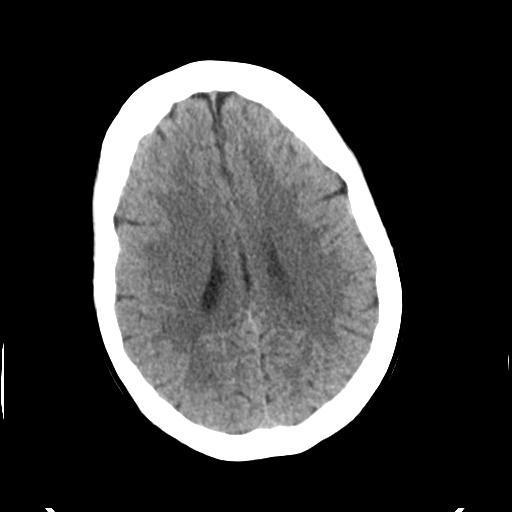
[im 21/30  brain]
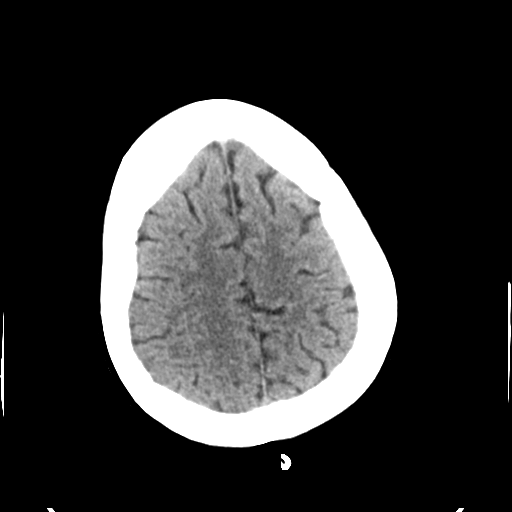
[im 21/30  bone]
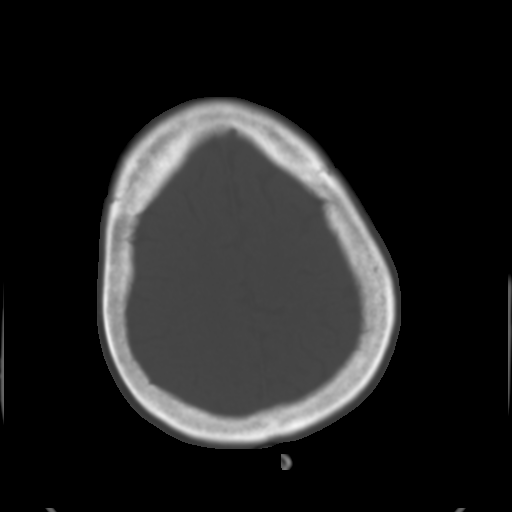
[im 25/30  brain]
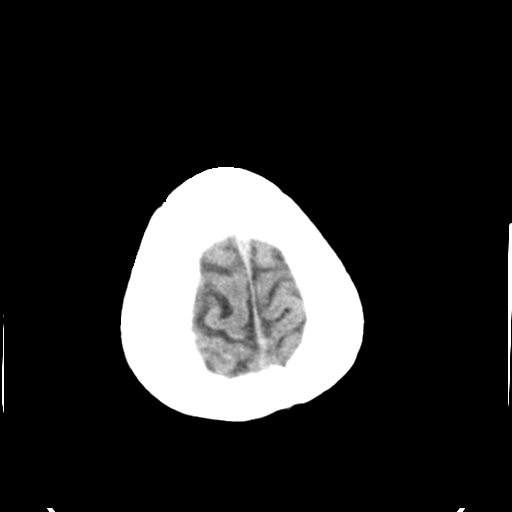

[Series 4: head bone · axial · 0.45mm/px · z∈[-134,-120]mm · 2 of 78 slices shown]
[im 8/78  bone]
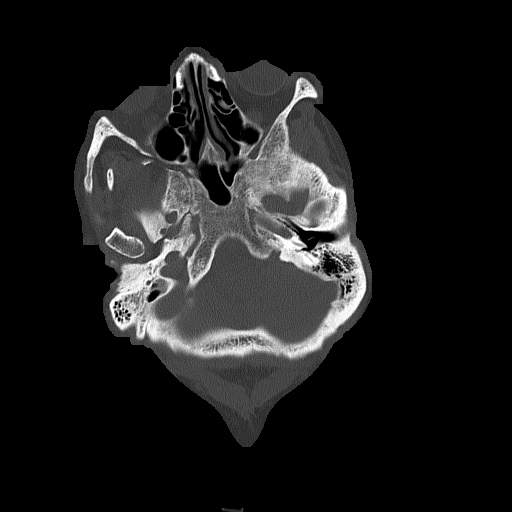
[im 15/78  bone]
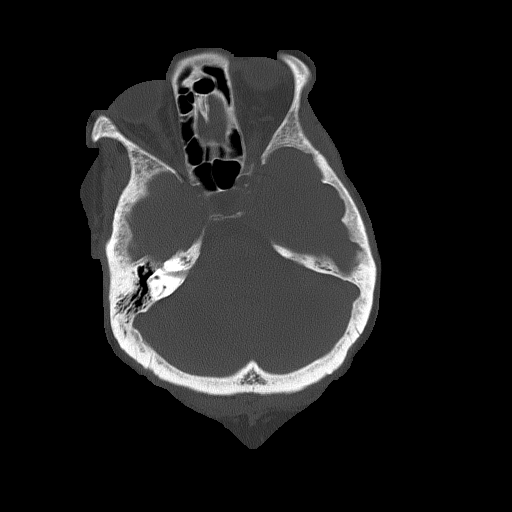

[Series 5: head without cor · coronal · non-contrast · 0.32mm/px · 3 of 71 slices shown]
[im 24/71  brain]
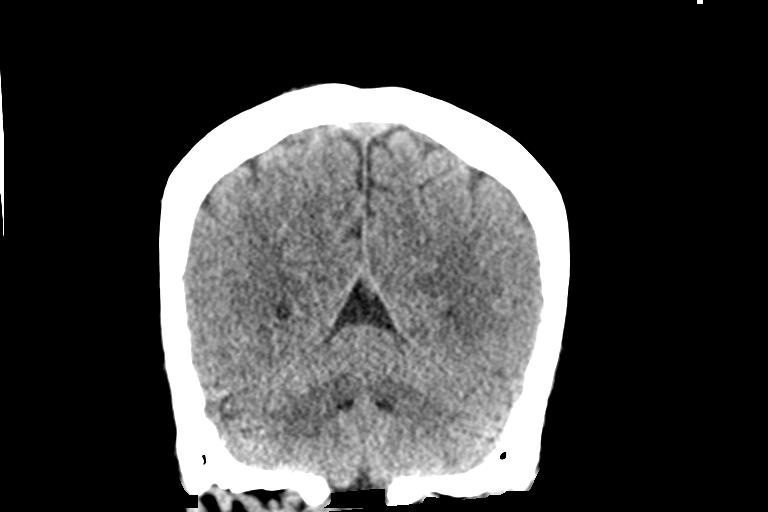
[im 32/71  brain]
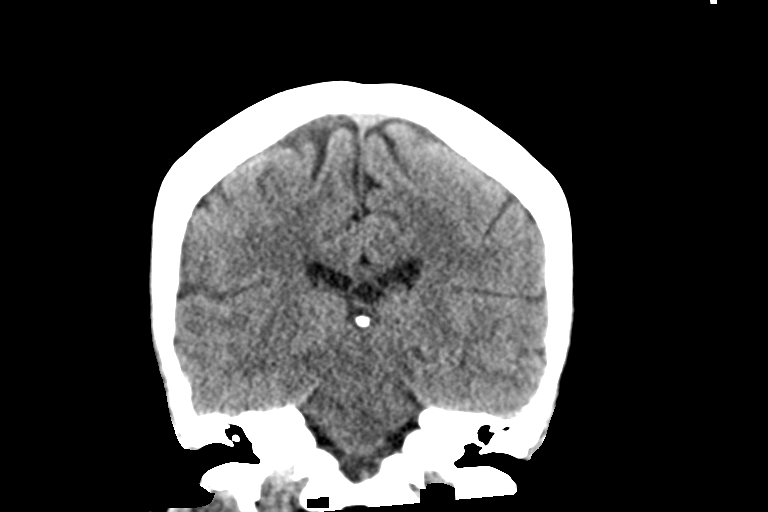
[im 39/71  brain]
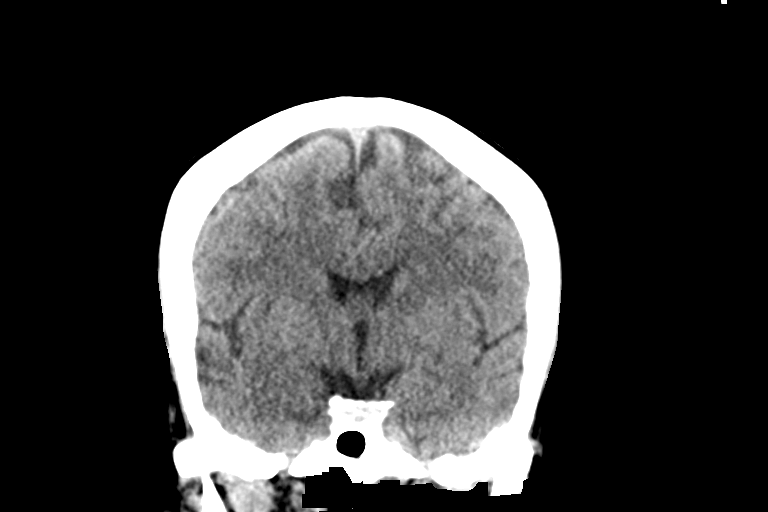

[Series 6: head without sag · sagittal · non-contrast · 0.30mm/px · 3 of 62 slices shown]
[im 21/62  brain]
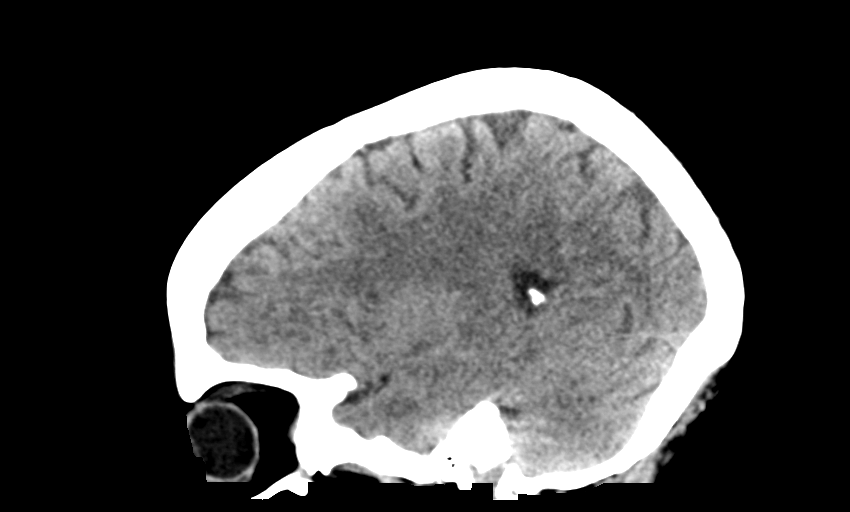
[im 31/62  brain]
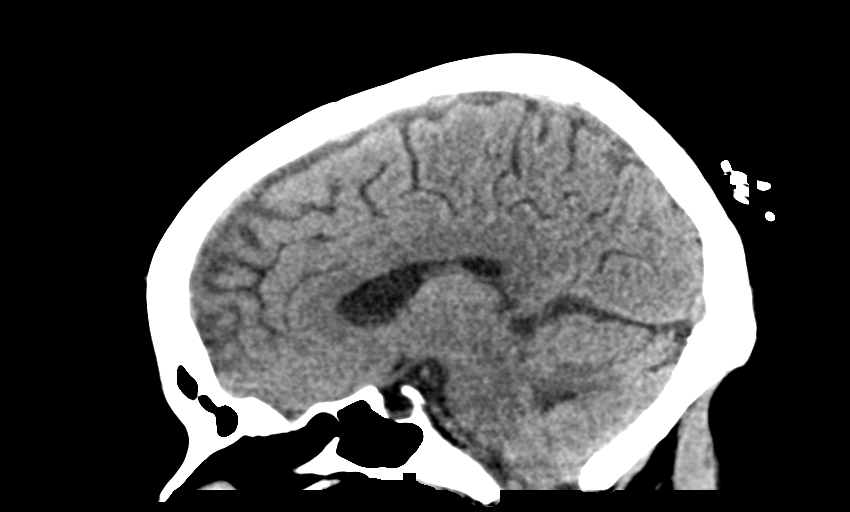
[im 41/62  brain]
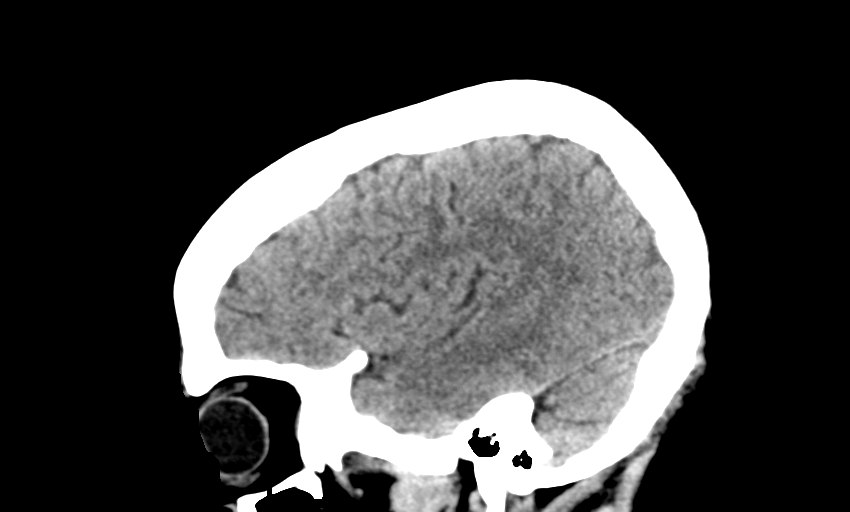

[14 of 47 positions shown; findings below may reference images not displayed]

FINDINGS: CT HEAD FINDINGS

Brain: No evidence of acute infarction, hemorrhage, hydrocephalus,
extra-axial collection or mass lesion/mass effect. Partially empty
sella.

Vascular: No hyperdense vessel or unexpected calcification.

Skull: Normal. Negative for fracture or focal lesion.

Sinuses/Orbits: Status post cataract surgery.

Other: None.

CT CERVICAL SPINE FINDINGS

Alignment: Normal.

Skull base and vertebrae: No acute fracture. No primary bone lesion
or focal pathologic process.

Soft tissues and spinal canal: No prevertebral fluid or swelling. No
visible canal hematoma.

Disc levels: Mild multilevel endplate proliferative changes without
high-grade canal stenosis.

Upper chest: Negative.

Other: There is a RIGHT thyroid nodule with a coarse calcification
which measures at least 2.5 cm. There is a LEFT thyroid nodule
measuring at least 1.6 cm. These have been assessed by ultrasound in
2358. The vertebral artery arises directly from the aortic arch.
IMPRESSION: 1.  No acute intracranial abnormality.

2.  No acute fracture or static subluxation of the cervical spine.

## 2021-04-04 IMAGING — CT CT T SPINE W/O CM
3 of 4 series · 13 of 33 positions shown, 15 images · non-contrast
Comparison: Chest x-ray 05/03/2020, thoracic radiograph 05/03/2020,
CT 11/18/2019

CLINICAL DATA: Mid back pain

EXAM:
CT THORACIC SPINE WITHOUT CONTRAST
TECHNIQUE: Multidetector CT images of the thoracic were obtained using the
standard protocol without intravenous contrast.

[Series 4: t-spine 2.0 st · axial · 0.30mm/px · z∈[+996,+1228]mm · 5 of 174 slices shown, 7 images]
[im 29/174  soft-tissue]
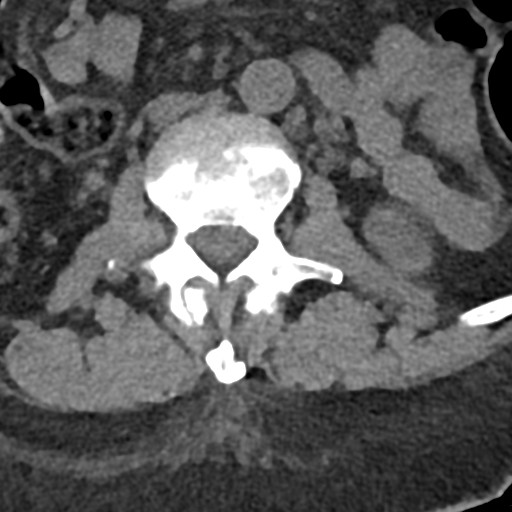
[im 29/174  bone]
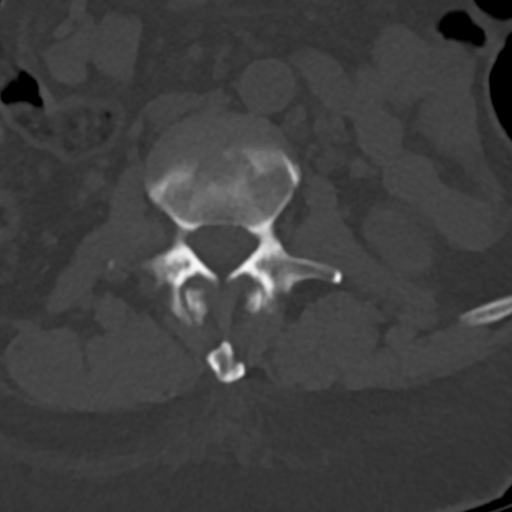
[im 58/174  bone]
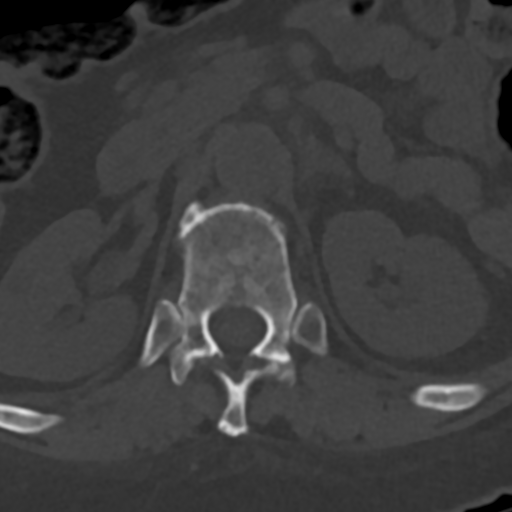
[im 87/174  bone]
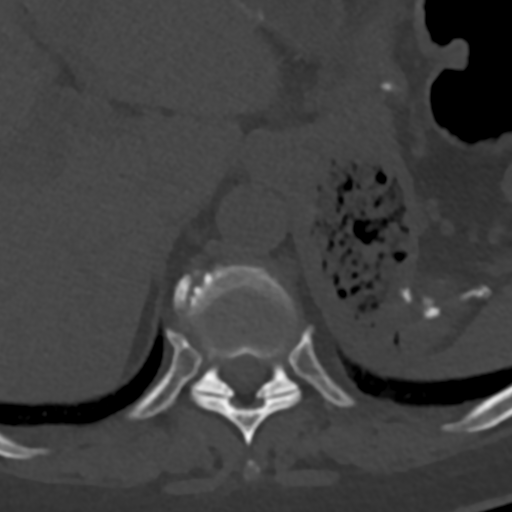
[im 116/174  bone]
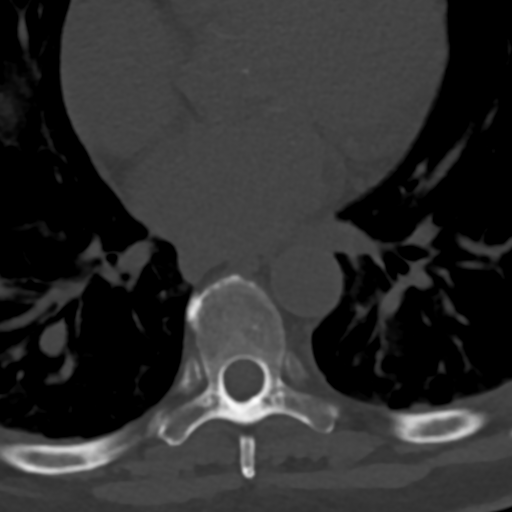
[im 145/174  soft-tissue]
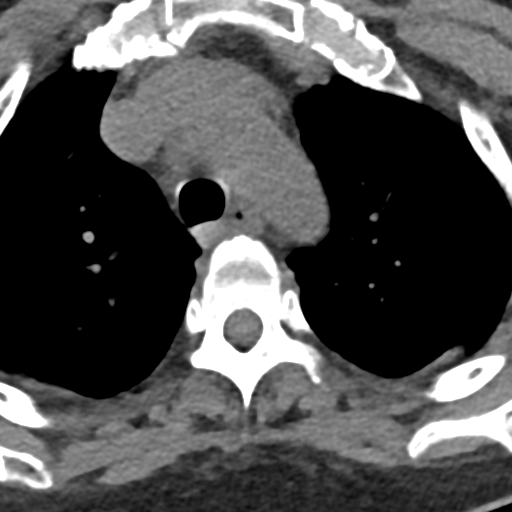
[im 145/174  bone]
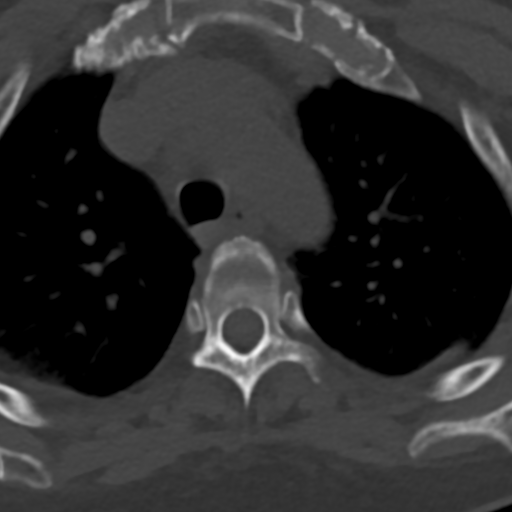

[Series 6: t-spine 2.0 cor bone · coronal · 0.27mm/px · 3 of 101 slices shown]
[im 21/101  bone]
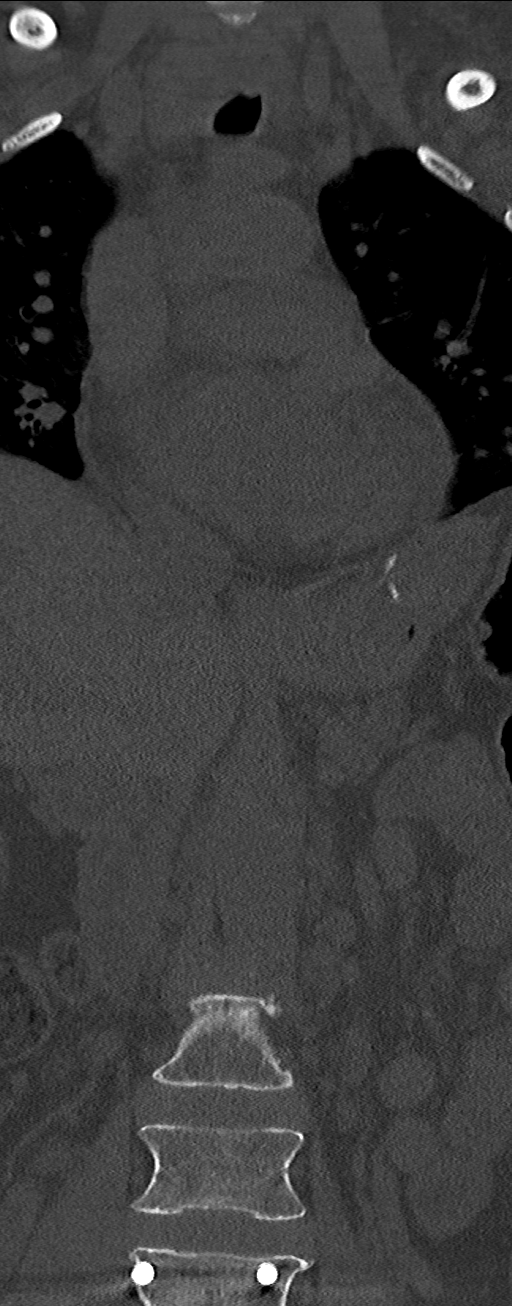
[im 41/101  bone]
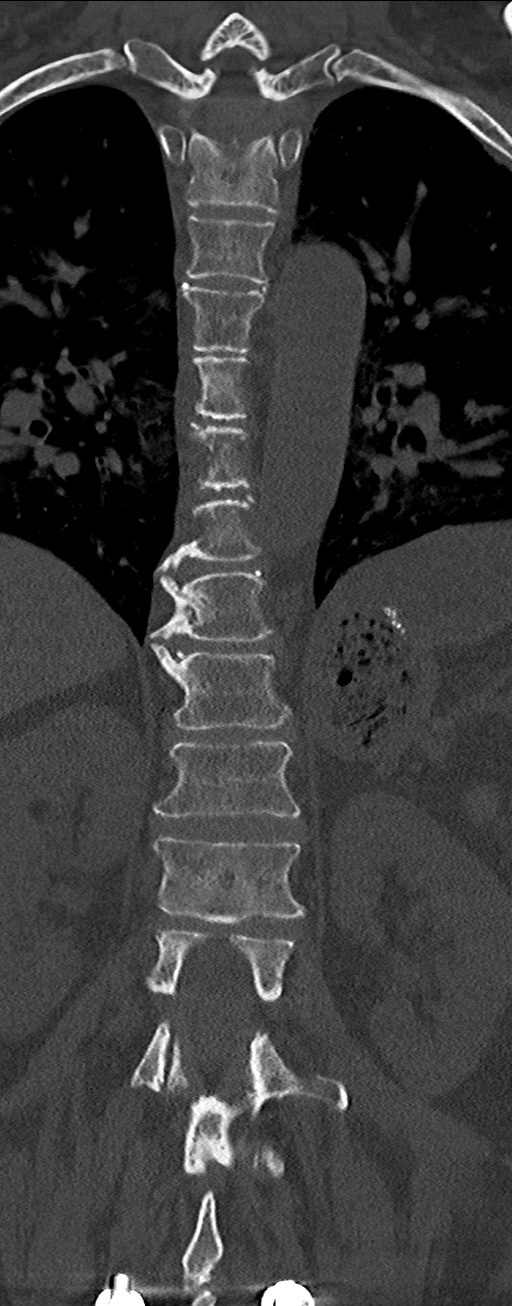
[im 61/101  bone]
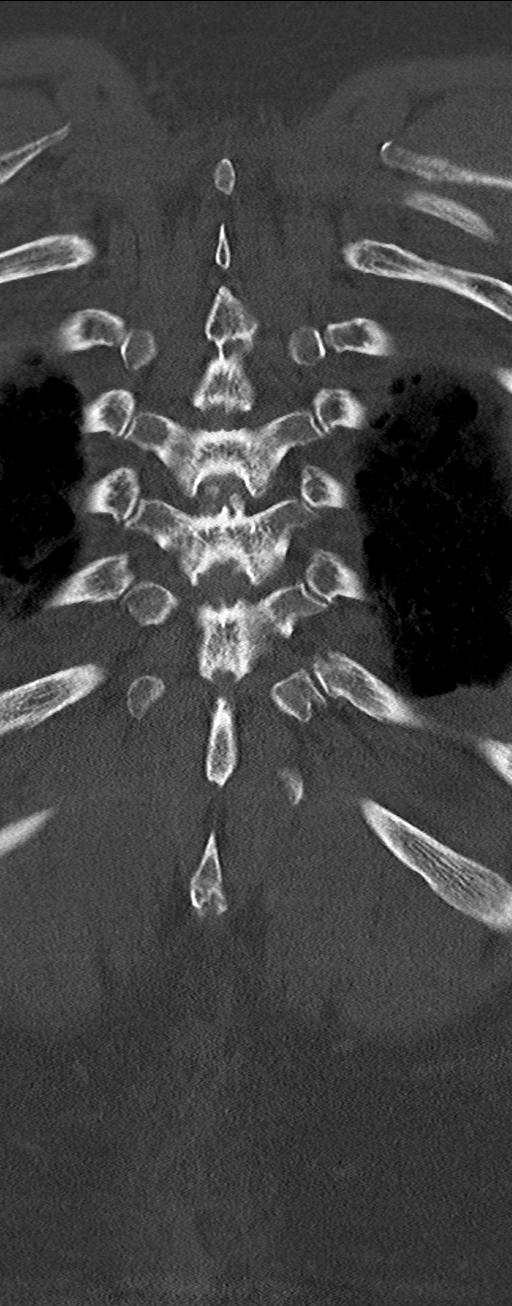

[Series 9: t-spine 2.0 orthogonal · axial · 0.21mm/px · z∈[+1004,+1218]mm · 5 of 184 slices shown]
[im 31/184  bone]
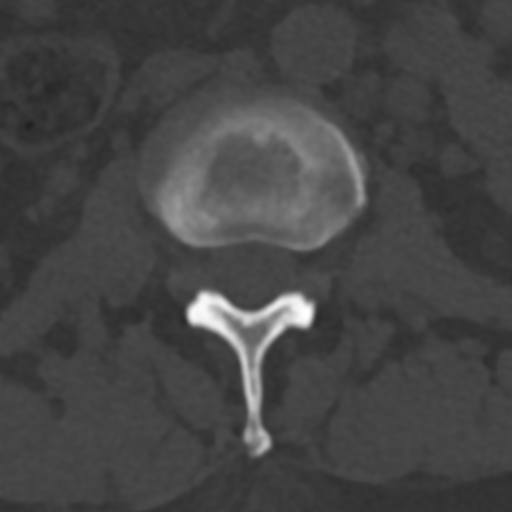
[im 62/184  bone]
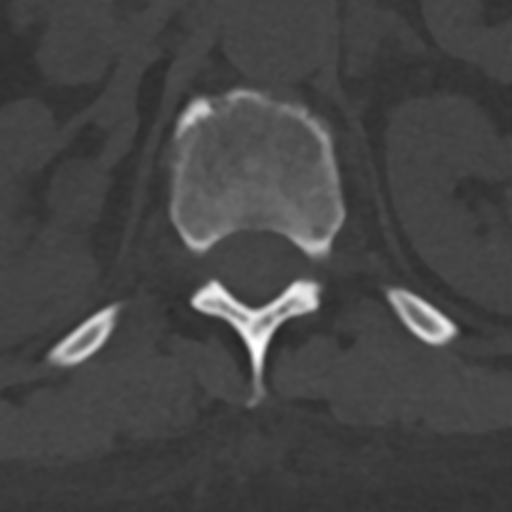
[im 92/184  bone]
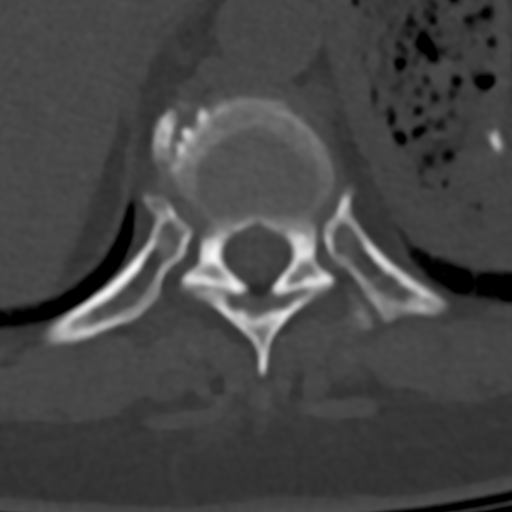
[im 123/184  bone]
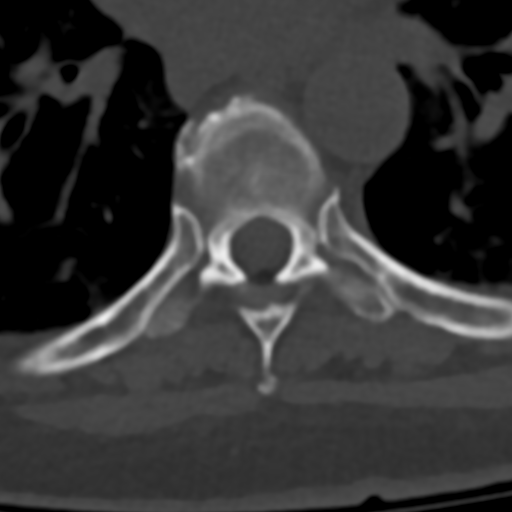
[im 153/184  bone]
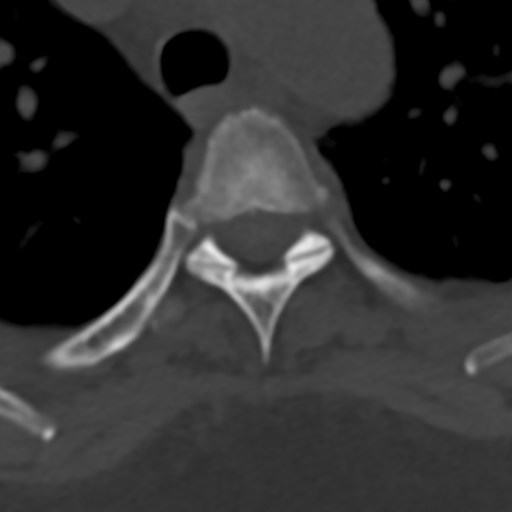

[13 of 33 positions shown; findings below may reference images not displayed]

FINDINGS: Alignment: Normal.

Vertebrae: No acute fracture or focal pathologic process.

Paraspinal and other soft tissues: No paravertebral or paraspinal
soft tissue abnormality. Stable multinodular thyroid, no further
workup recommended given stable appearance on previous exams.
Incompletely visualized postsurgical stone gist of the stomach.
Probable cyst in the right kidney. Incompletely visualized hardware
in the lumbar spine.

Disc levels: Disc spaces are relatively maintained. Anterior and
right lateral osteophytes at multiple levels. No high grade canal
stenosis. No bony impingement upon the spinal canal.
IMPRESSION: 1. No CT evidence for acute osseous abnormality of the thoracic
spine.
2. Mild degenerative changes of the thoracic spine.

## 2021-04-04 IMAGING — DX DG PELVIS 1-2V
1 series · 1 of 1 positions shown · non-contrast
Comparison: None.

CLINICAL DATA: Pain

EXAM:
PELVIS - 1-2 VIEW

[pelvis ap]
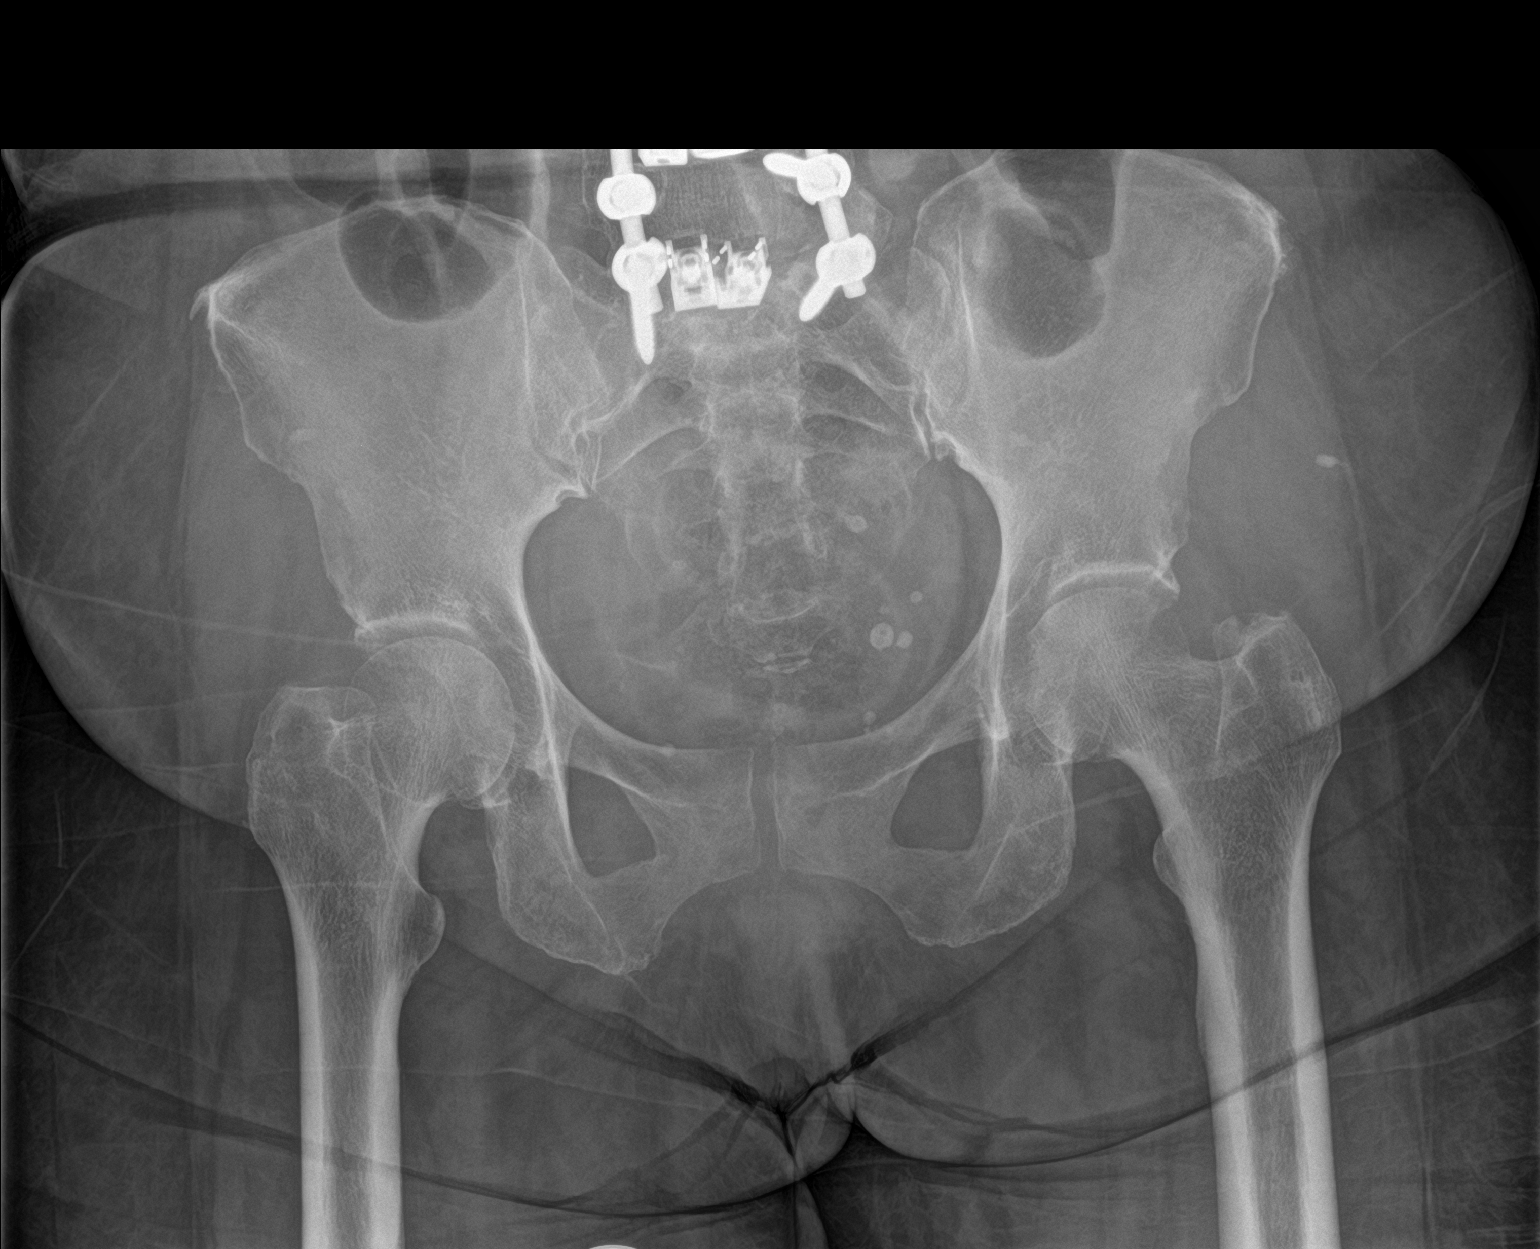

[1 of 1 positions shown; findings below may reference images not displayed]

FINDINGS: There is no evidence of pelvic fracture or diastasis. No pelvic bone
lesions are seen.
IMPRESSION: Negative.

## 2021-04-04 IMAGING — DX DG THORACIC SPINE 2V
3 series · 3 of 3 positions shown · non-contrast
Comparison: 05/03/2020 chest radiograph.

CLINICAL DATA: Chest pain

EXAM:
THORACIC SPINE 2 VIEWS

[t-spine ap]
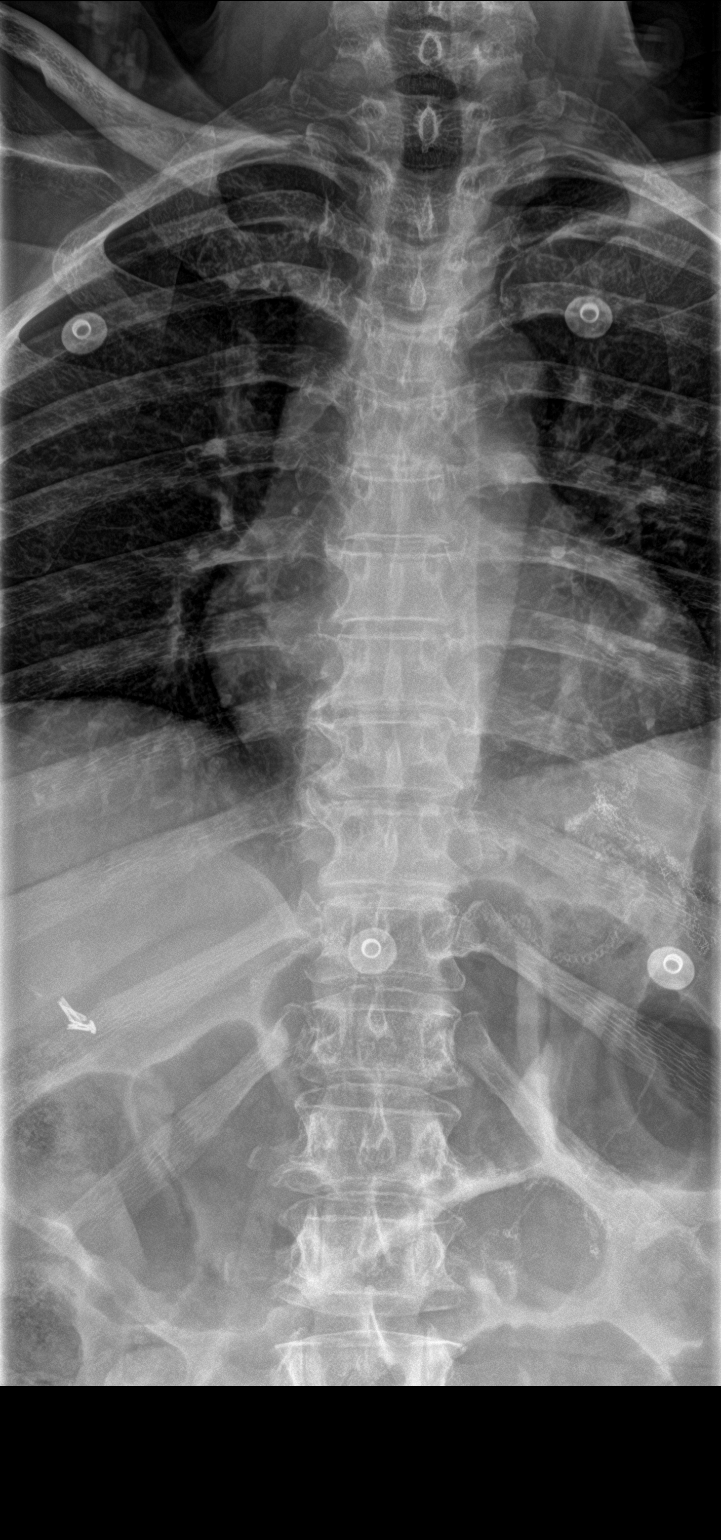

[t-spine lat]
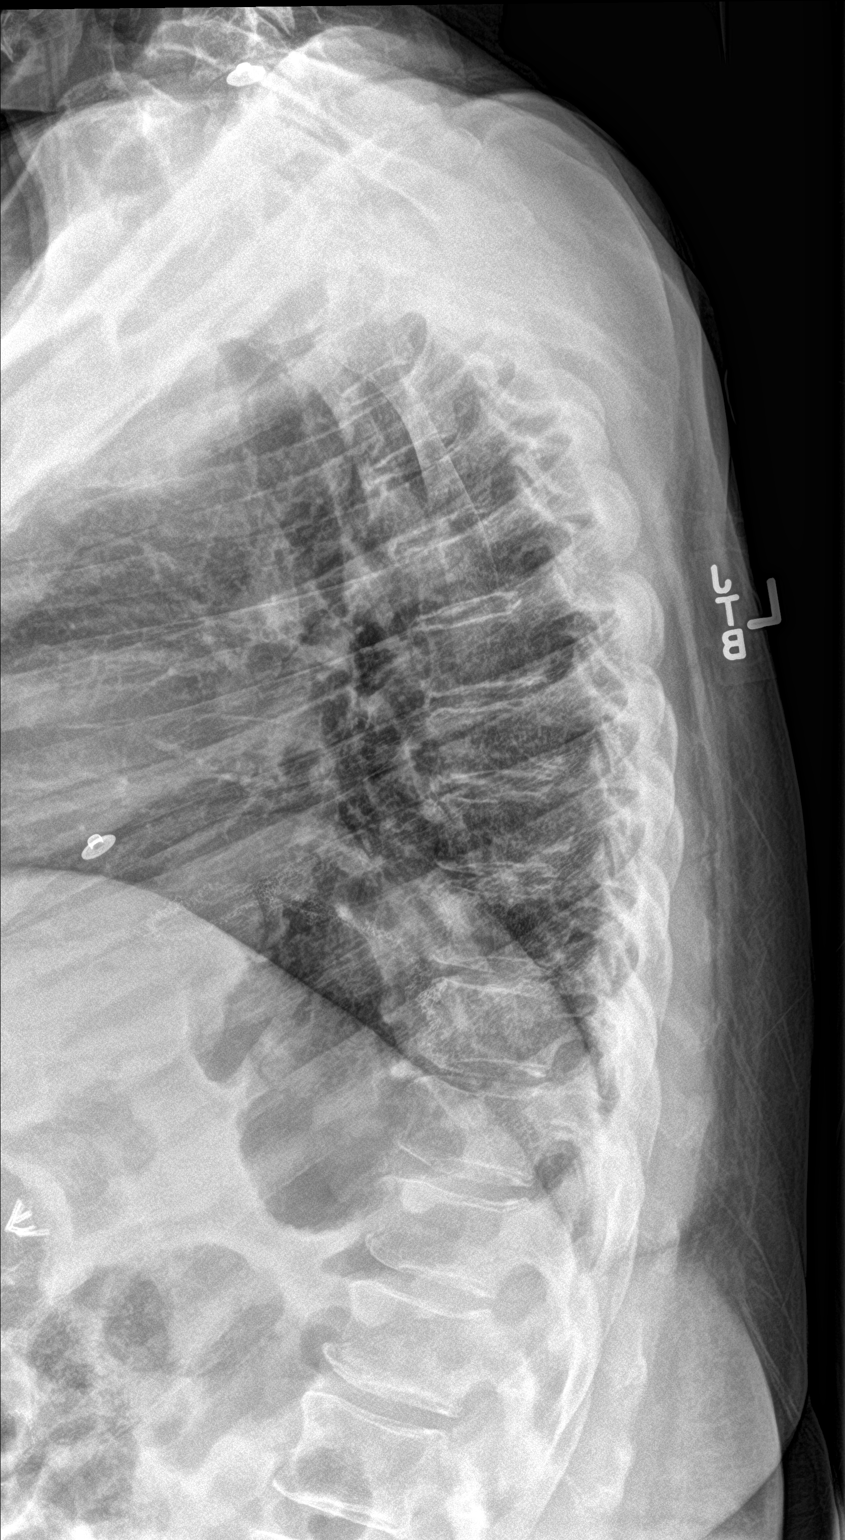

[t-spine swimmers]
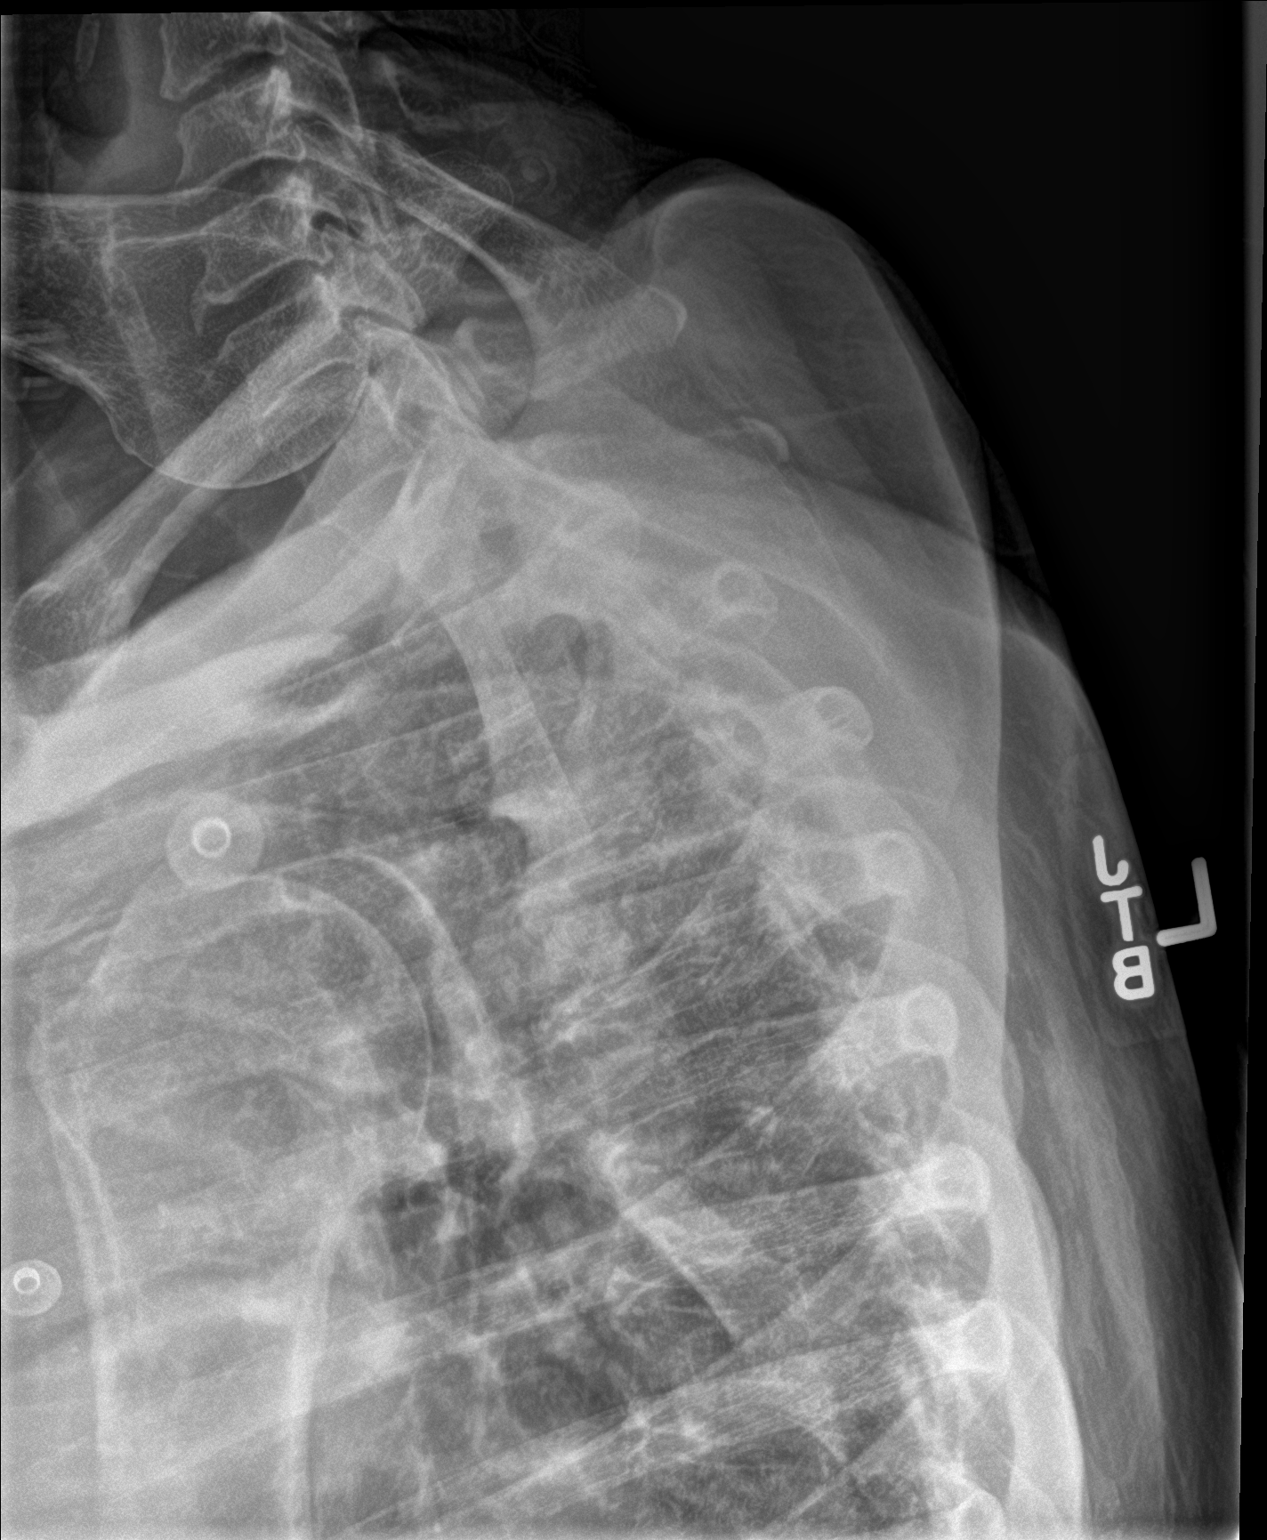

[3 of 3 positions shown; findings below may reference images not displayed]

FINDINGS: Age indeterminate mild height loss at the T4, T7 and T8 vertebral
bodies. Alignment is normal. Multilevel spondylosis.

Clear lung fields. Cardiomediastinal silhouette within normal
limits. Right upper quadrant surgical clips. Partially imaged lumbar
spine hardware.
IMPRESSION: Query age indeterminate posttraumatic deformities at the T4, T7 and
T8 levels. Consider CT thoracic spine for further evaluation.

No traumatic listhesis.

## 2021-04-04 IMAGING — CT CT CERVICAL SPINE W/O CM
4 series · 15 of 33 positions shown, 18 images · non-contrast
Comparison: [DATE] [DATE] [DATE]. [DATE] [DATE], [DATE]

CLINICAL DATA: Trauma

EXAM:
CT HEAD WITHOUT CONTRAST
CT CERVICAL SPINE WITHOUT CONTRAST
TECHNIQUE: Multidetector CT imaging of the head and cervical spine was
performed following the standard protocol without intravenous
contrast. Multiplanar CT image reconstructions of the cervical spine
were also generated.

[Series 4: c_spine 2.0 st · axial · 0.34mm/px · z∈[-280,-156]mm · 5 of 94 slices shown, 7 images]
[im 16/94  soft-tissue]
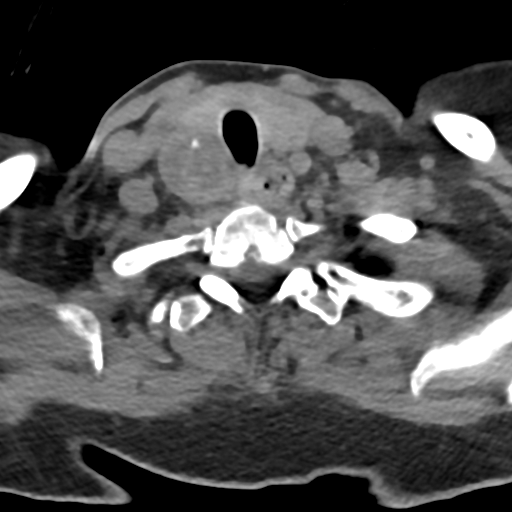
[im 16/94  bone]
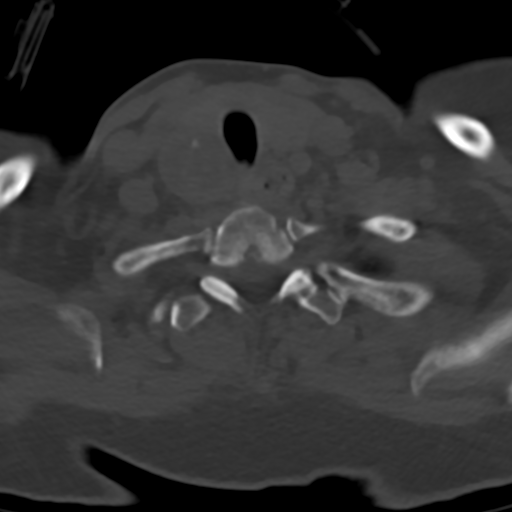
[im 32/94  bone]
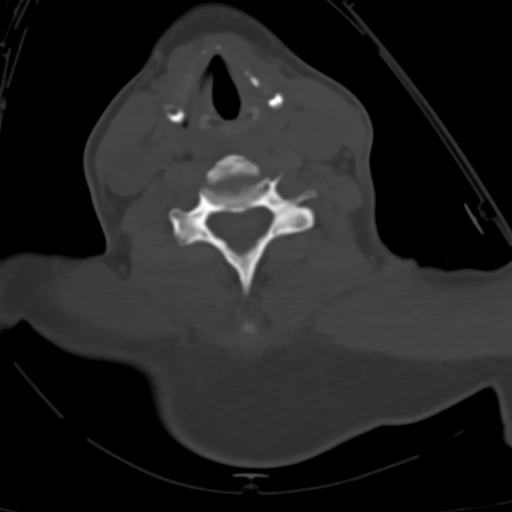
[im 47/94  bone]
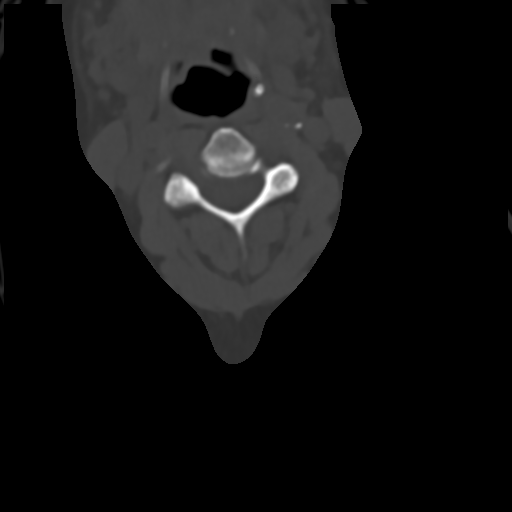
[im 63/94  bone]
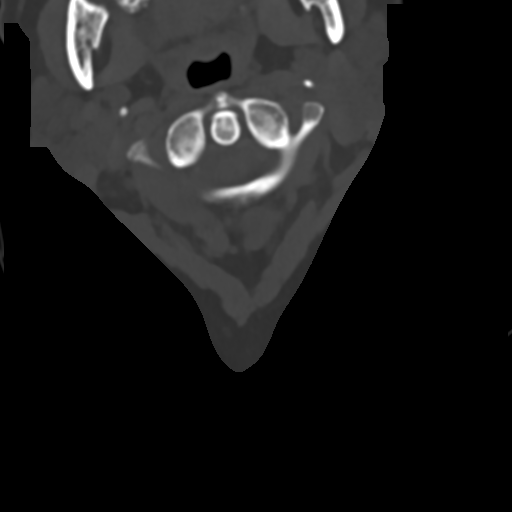
[im 78/94  soft-tissue]
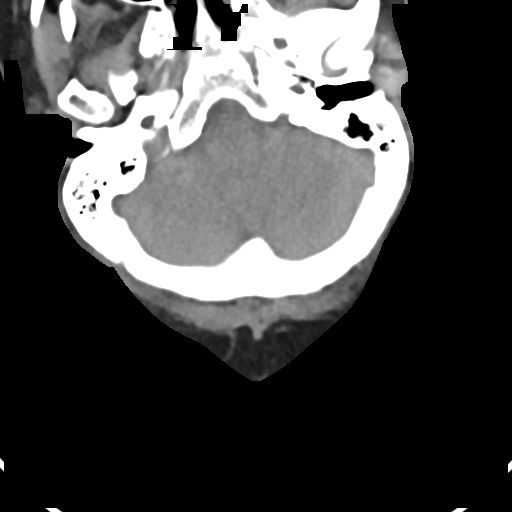
[im 78/94  bone]
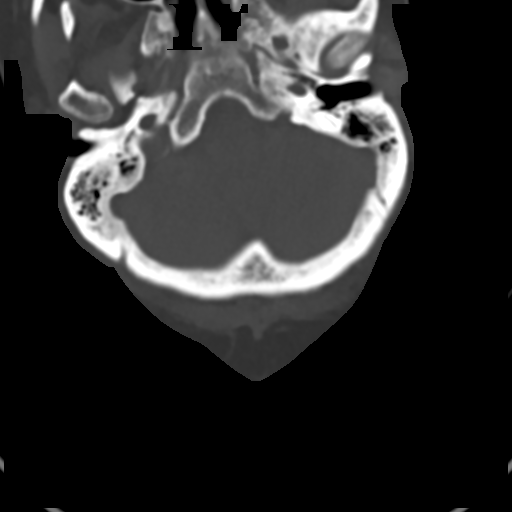

[Series 8: c_spine 2.0 sag bone · sagittal · 0.34mm/px · 5 of 79 slices shown, 6 images]
[im 27/79  bone]
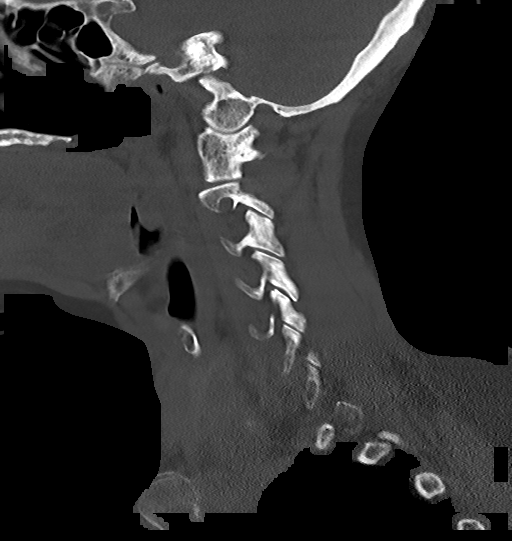
[im 33/79  bone]
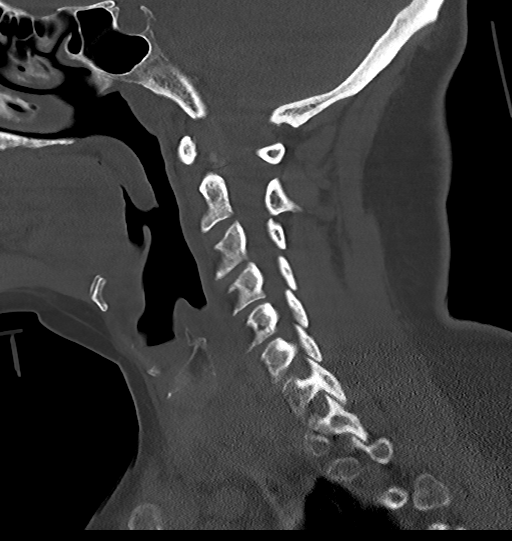
[im 40/79  soft-tissue]
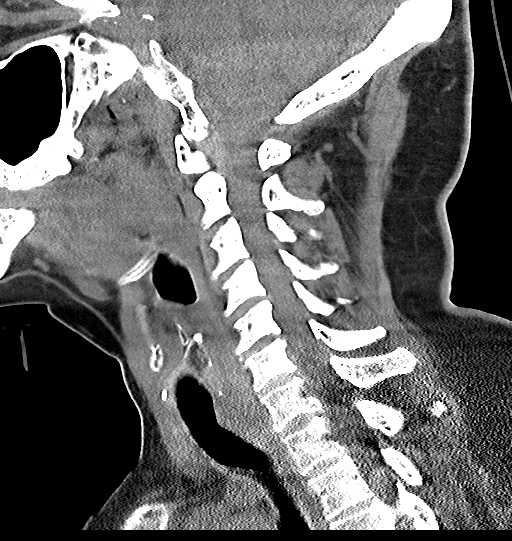
[im 40/79  bone]
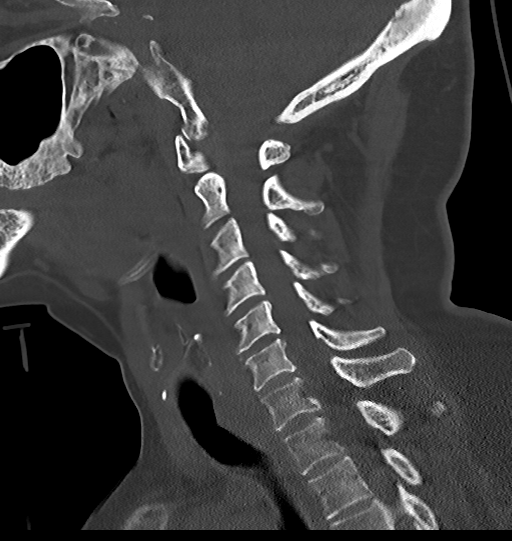
[im 46/79  bone]
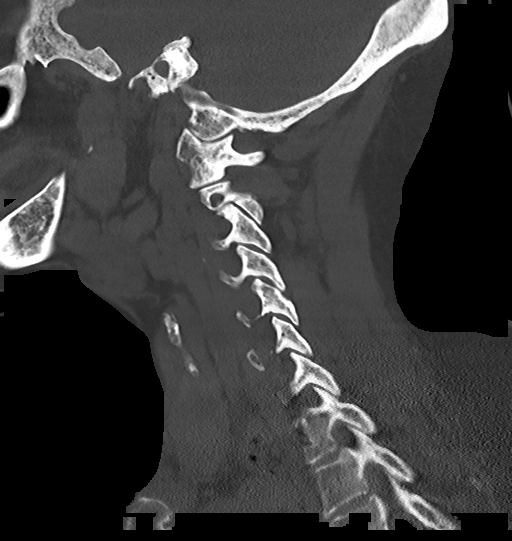
[im 53/79  bone]
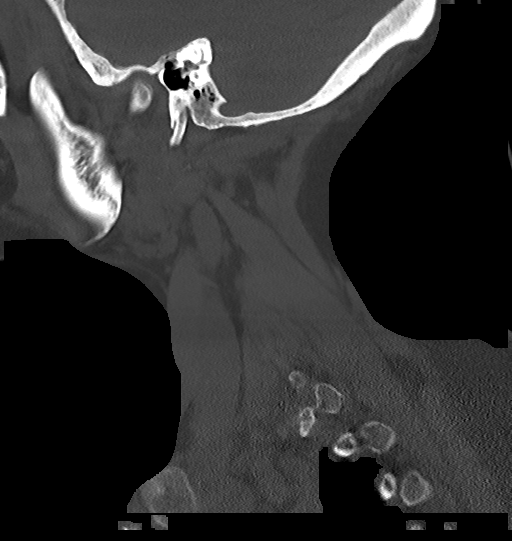

[Series 9: c_spine 2.0 cor bone · coronal · 0.31mm/px · 3 of 84 slices shown]
[im 17/84  bone]
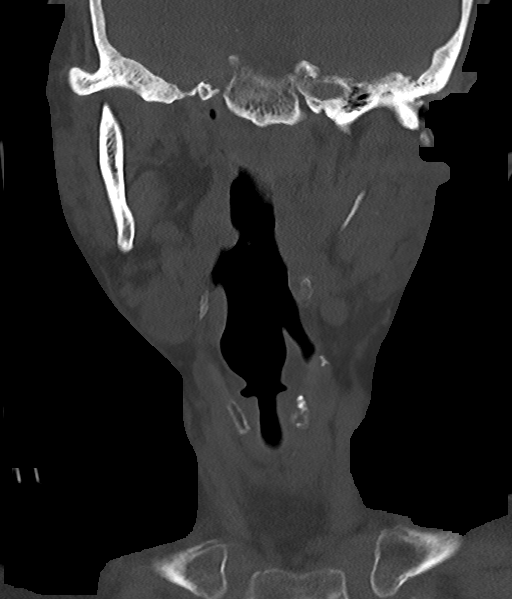
[im 34/84  bone]
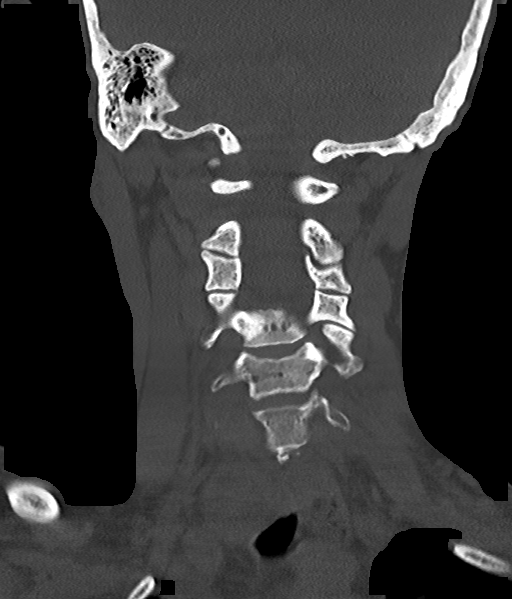
[im 50/84  bone]
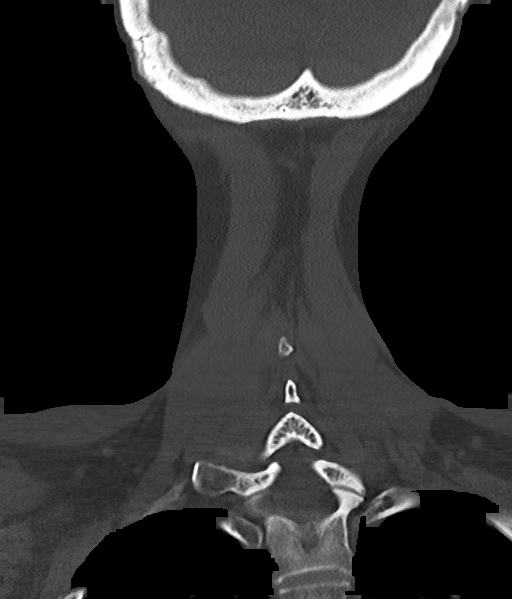

[Series 10: c_spine 2.0 orthogonals · axial · 0.21mm/px · z∈[-311,-289]mm · 2 of 88 slices shown]
[im 15/88  bone]
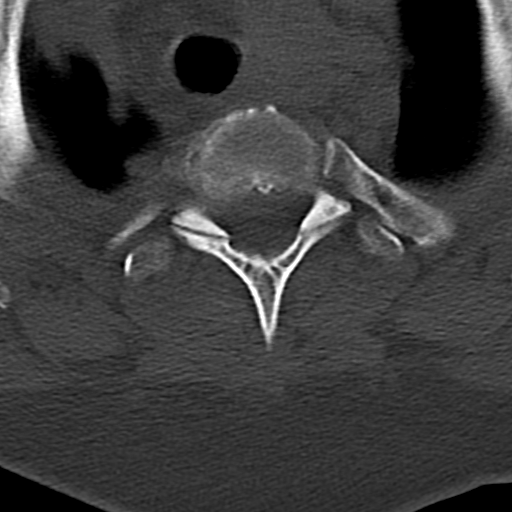
[im 30/88  bone]
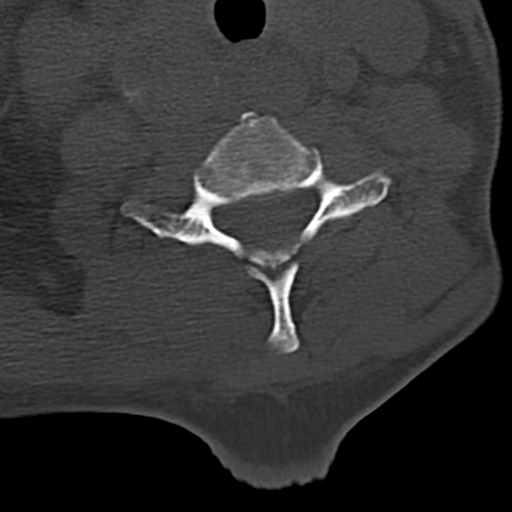

[15 of 33 positions shown; findings below may reference images not displayed]

FINDINGS: CT HEAD FINDINGS

Brain: No evidence of acute infarction, hemorrhage, hydrocephalus,
extra-axial collection or mass lesion/mass effect. Partially empty
sella.

Vascular: No hyperdense vessel or unexpected calcification.

Skull: Normal. Negative for fracture or focal lesion.

Sinuses/Orbits: Status post cataract surgery.

Other: None.

CT CERVICAL SPINE FINDINGS

Alignment: Normal.

Skull base and vertebrae: No acute fracture. No primary bone lesion
or focal pathologic process.

Soft tissues and spinal canal: No prevertebral fluid or swelling. No
visible canal hematoma.

Disc levels: Mild multilevel endplate proliferative changes without
high-grade canal stenosis.

Upper chest: Negative.

Other: There is a RIGHT thyroid nodule with a coarse calcification
which measures at least 2.5 cm. There is a LEFT thyroid nodule
measuring at least 1.6 cm. These have been assessed by ultrasound in
2358. The vertebral artery arises directly from the aortic arch.
IMPRESSION: 1.  No acute intracranial abnormality.

2.  No acute fracture or static subluxation of the cervical spine.

## 2021-04-04 IMAGING — DX DG CHEST 1V
1 series · 1 of 1 positions shown · non-contrast
Comparison: 11/18/2019

CLINICAL DATA: Chest pain.

EXAM:
CHEST  1 VIEW

[chest ap]
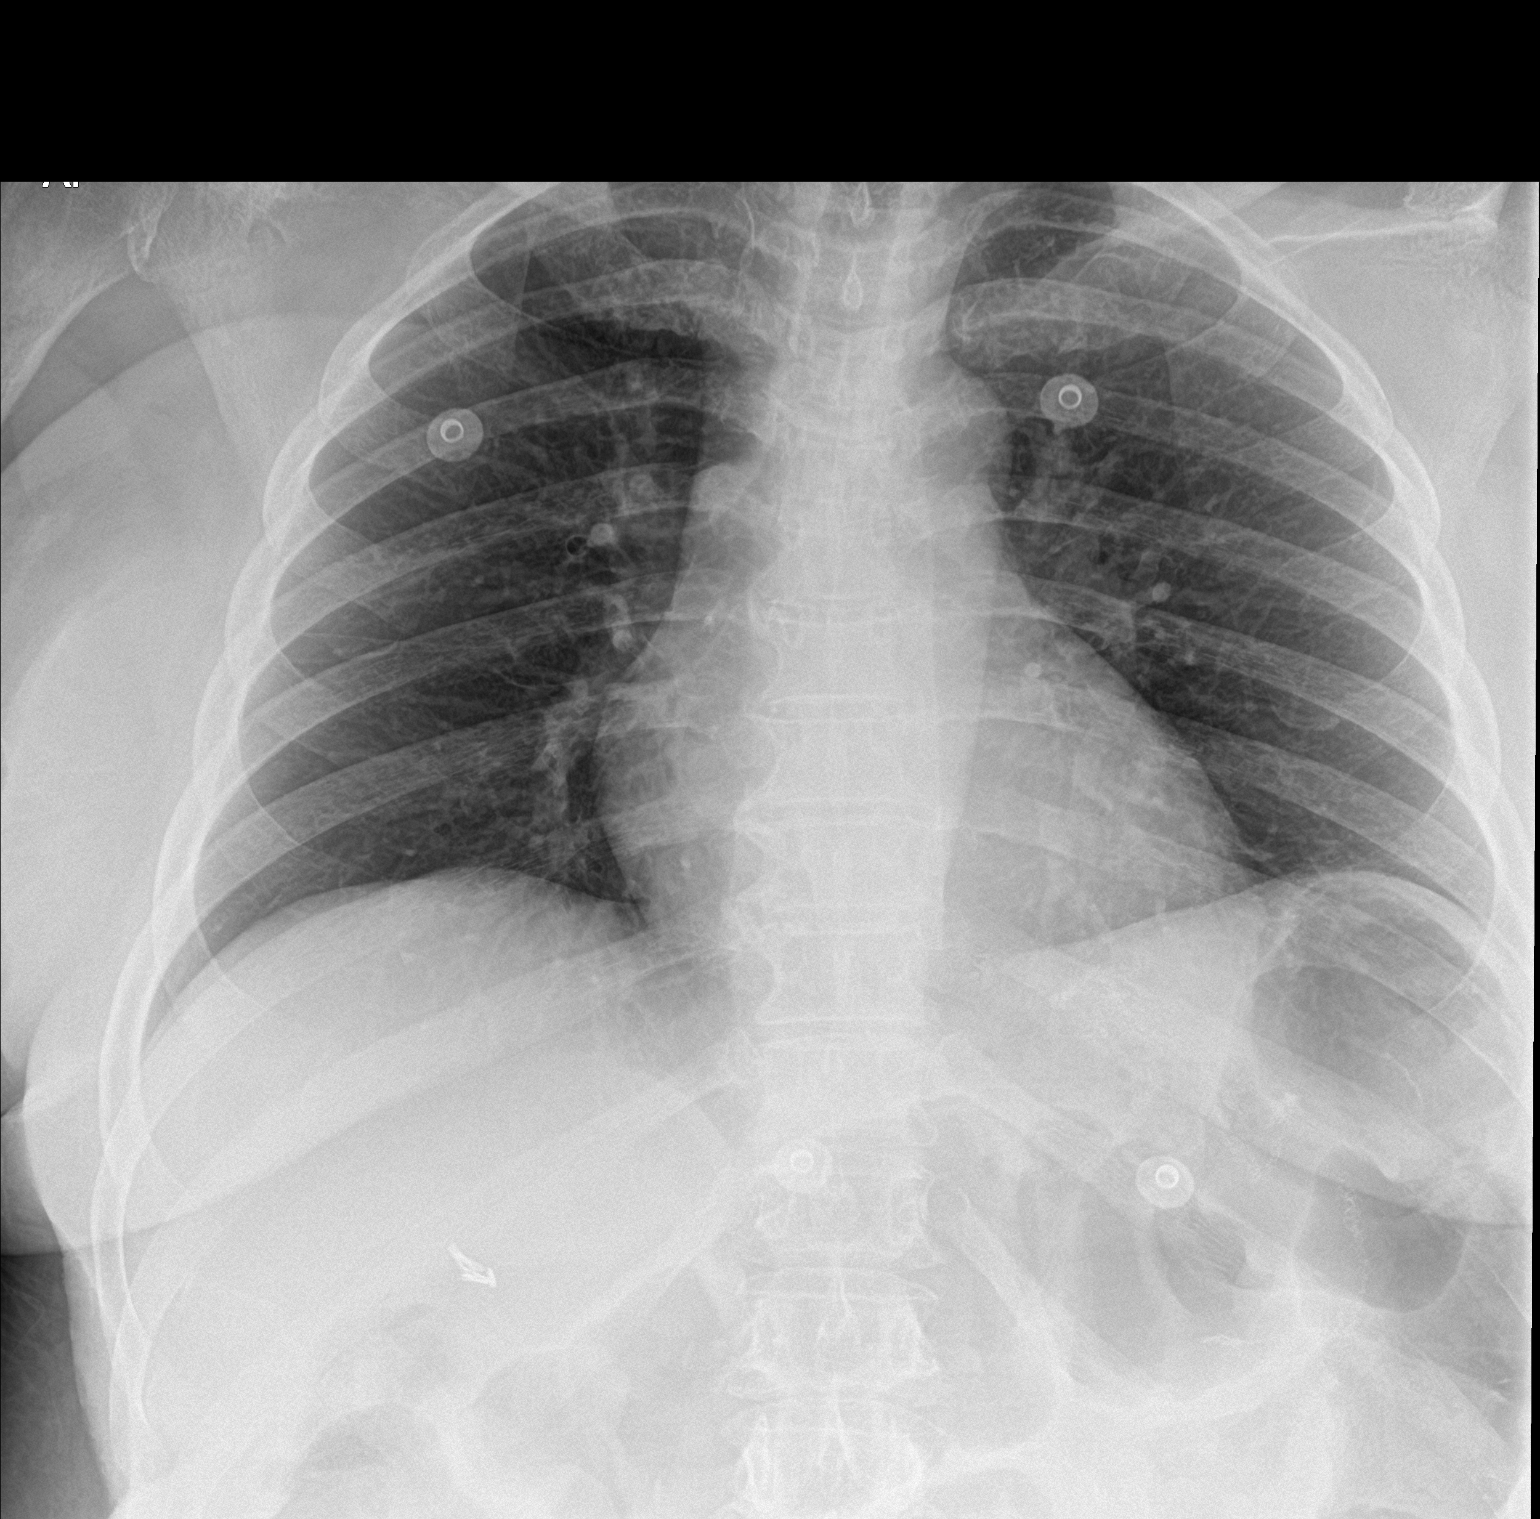

[1 of 1 positions shown; findings below may reference images not displayed]

FINDINGS: The heart size and mediastinal contours are within normal limits.
Both lungs are clear. The visualized skeletal structures are
unremarkable.
IMPRESSION: No active disease.

## 2021-04-15 ENCOUNTER — Emergency Department (HOSPITAL_COMMUNITY): Payer: Medicare (Managed Care)

## 2021-04-15 ENCOUNTER — Encounter (HOSPITAL_COMMUNITY): Payer: Self-pay | Admitting: Emergency Medicine

## 2021-04-15 ENCOUNTER — Other Ambulatory Visit: Payer: Self-pay

## 2021-04-15 ENCOUNTER — Emergency Department (HOSPITAL_COMMUNITY)
Admission: EM | Admit: 2021-04-15 | Discharge: 2021-04-15 | Disposition: A | Discharge status: Home / Self Care | Payer: Medicare (Managed Care) | Attending: Emergency Medicine | Admitting: Emergency Medicine

## 2021-04-15 DIAGNOSIS — I2693 Single subsegmental pulmonary embolism without acute cor pulmonale: Secondary | ICD-10-CM

## 2021-04-15 DIAGNOSIS — Z7902 Long term (current) use of antithrombotics/antiplatelets: Secondary | ICD-10-CM | POA: Diagnosis not present

## 2021-04-15 DIAGNOSIS — R220 Localized swelling, mass and lump, head: Secondary | ICD-10-CM | POA: Diagnosis not present

## 2021-04-15 DIAGNOSIS — Z7982 Long term (current) use of aspirin: Secondary | ICD-10-CM | POA: Insufficient documentation

## 2021-04-15 DIAGNOSIS — Z79899 Other long term (current) drug therapy: Secondary | ICD-10-CM | POA: Insufficient documentation

## 2021-04-15 DIAGNOSIS — M7989 Other specified soft tissue disorders: Secondary | ICD-10-CM | POA: Insufficient documentation

## 2021-04-15 DIAGNOSIS — E876 Hypokalemia: Secondary | ICD-10-CM

## 2021-04-15 DIAGNOSIS — R109 Unspecified abdominal pain: Secondary | ICD-10-CM | POA: Insufficient documentation

## 2021-04-15 DIAGNOSIS — R0602 Shortness of breath: Secondary | ICD-10-CM | POA: Insufficient documentation

## 2021-04-15 DIAGNOSIS — I10 Essential (primary) hypertension: Secondary | ICD-10-CM | POA: Diagnosis not present

## 2021-04-15 DIAGNOSIS — D649 Anemia, unspecified: Secondary | ICD-10-CM

## 2021-04-15 LAB — CBC
HCT: 30.4 % — ABNORMAL LOW (ref 36.0–46.0)
Hemoglobin: 9.9 g/dL — ABNORMAL LOW (ref 12.0–15.0)
MCH: 31 pg (ref 26.0–34.0)
MCHC: 32.6 g/dL (ref 30.0–36.0)
MCV: 95.3 fL (ref 80.0–100.0)
Platelets: 155 10*3/uL (ref 150–400)
RBC: 3.19 MIL/uL — ABNORMAL LOW (ref 3.87–5.11)
RDW: 16.1 % — ABNORMAL HIGH (ref 11.5–15.5)
WBC: 8.1 10*3/uL (ref 4.0–10.5)
nRBC: 0 % (ref 0.0–0.2)

## 2021-04-15 LAB — BASIC METABOLIC PANEL
Anion gap: 3 — ABNORMAL LOW (ref 5–15)
BUN: 11 mg/dL (ref 8–23)
CO2: 28 mmol/L (ref 22–32)
Calcium: 7.7 mg/dL — ABNORMAL LOW (ref 8.9–10.3)
Chloride: 112 mmol/L — ABNORMAL HIGH (ref 98–111)
Creatinine, Ser: 0.88 mg/dL (ref 0.44–1.00)
GFR, Estimated: >60 mL/min (ref >60)
Glucose, Bld: 66 mg/dL — ABNORMAL LOW (ref 70–99)
Potassium: 3 mmol/L — ABNORMAL LOW (ref 3.5–5.1)
Sodium: 143 mmol/L (ref 135–145)

## 2021-04-15 LAB — SEDIMENTATION RATE: Sed Rate: 2 mm/hr (ref 0–22)

## 2021-04-15 LAB — TROPONIN I (HIGH SENSITIVITY)
Troponin I (High Sensitivity): 18 ng/L — ABNORMAL HIGH (ref <18)
Troponin I (High Sensitivity): 19 ng/L — ABNORMAL HIGH (ref <18)

## 2021-04-15 MED ORDER — POTASSIUM CHLORIDE CRYS ER 20 MEQ PO TBCR
40.0000 meq | EXTENDED_RELEASE_TABLET | Freq: Once | ORAL | Status: AC
  Administered 2021-04-15: 40 meq via ORAL
  Filled 2021-04-15: qty 2

## 2021-04-15 MED ORDER — APIXABAN (ELIQUIS) VTE STARTER PACK (10MG AND 5MG): ORAL_TABLET | ORAL | 0 refills | Status: AC

## 2021-04-15 MED ORDER — LACTATED RINGERS IV SOLN: INTRAVENOUS | Status: DC

## 2021-04-15 MED ORDER — IOHEXOL 350 MG/ML SOLN
100.0000 mL | Freq: Once | INTRAVENOUS | Status: AC | PRN
  Administered 2021-04-15: 100 mL via INTRAVENOUS

## 2021-04-15 MED ORDER — APIXABAN 5 MG PO TABS
10.0000 mg | ORAL_TABLET | Freq: Once | ORAL | Status: AC
  Administered 2021-04-15: 10 mg via ORAL
  Filled 2021-04-15: qty 2

## 2021-04-15 NOTE — ED Triage Notes (Addendum)
Pt states she woke up with right sided swelling to her face. Swelling into left arm and left leg. Denies injury to site. States the swelling started over the weekend. Pt is able to make a fist and move arm and leg.  Pt also states she has some chest pain that has been intermittent.

## 2021-04-15 NOTE — ED Provider Notes (Signed)
[facility] EMERGENCY DEPARTMENT Provider Note   CSN: [id number] Arrival date & time: 04/15/21  0931     History Chief Complaint  Patient presents with   Facial Swelling    Jodi Bowman is a 60 y.o. female.  HPI Patient reports has been getting problems with swelling in her left arm and leg.  She reports this morning she awakened and she had a very large amount of swelling on the right side of her face.  She reports since earlier today that has decreased some.  She also reports that the amount of swelling she previously had in her left arm seems to be decreased as well.  She reports has had more persistent swelling in the both legs but left leg more so.  She reports she has felt slightly short of breath.  No fevers or productive cough.  No hemoptysis.  She reports that the extremity will feel stiff but is not painful.  She is also been getting variable blisters over from parts of her body.  She reports sometimes they come up and they are fairly small they rupture and then go away.  He reports right now she has a much larger one on the left forearm.  She reports she is talk to her doctor about this but they do not have a diagnosis.  He is not getting any particular treatment.    Past Medical History:  Diagnosis Date   Arthritis    Calculus of gallbladder without mention of cholecystitis or obstruction    Dizziness    Fibromyalgia    GERD (gastroesophageal reflux disease)    Goiter    Hypercholesteremia    Hypertension    Lower back pain    Migraine    Nontoxic uninodular goiter    sees dr vollmer at [employer]   Obesity    PONV (postoperative nausea and vomiting)    Sleep apnea    STOPBANG=5   Stroke [facility]) 2000    Patient Active Problem List   Diagnosis Date Noted   Dysphagia    Mood disorder ([place]) 03/12/2021   Weakness 08/27/2020   Mental status change resolved 08/27/2020   Weakness of left side of body 06/29/2020   ACS (acute coronary syndrome)  ([place]) 08/25/2019   Chest pain 08/25/2019   Postoperative seroma involving nervous system after nervous system procedure 04/05/2019   Drainage from wound 04/02/2019   Infective otitis externa of left ear    Left otitis media    Urinary retention    Orthostasis    Acute blood loss anemia    Fibromyalgia    Chronic pain syndrome    Lumbar radiculopathy 03/08/2019   Orthostatic hypotension 03/06/2019   Postoperative urinary retention 03/06/2019   Lumbar foraminal stenosis 03/04/2019   S/P exploratory laparotomy 03/06/2018   Bowel obstruction ([place]) 03/06/2018   Chest pain, rule out acute myocardial infarction 12/17/2017   Hypokalemia 12/17/2017   Acute lower UTI 12/17/2017   Vertigo 04/30/2017   Small vessel disease, cerebrovascular 04/30/2017   Chronic low back pain 08/01/2015   Right hip pain 08/01/2015   Abnormality of gait 08/01/2015   Left leg weakness    Low back pain with radiation    Syncope 07/30/2014   Atypical chest pain 03/12/2014   Lap chole IOC April 2013 01/29/2012   Gallstones 12/04/2011   Thyroid nodule-non neoplastic goiter by needle aspiration 12/04/2011   GLUCOSE INTOLERANCE 03/12/2010   DYSLIPIDEMIA 03/12/2010   Chronic migraine 03/12/2010   Carotid  stenosis 03/12/2010   CEREBROVASCULAR ACCIDENT 03/12/2010   LIPOMA 01/29/2010   HEADACHE 01/29/2010   ANKLE INJURY, RIGHT 04/12/2009   PHARYNGITIS 03/21/2009   Backache 03/01/2009   ALLERGIC RHINITIS 03/17/2007   LOW BACK PAIN 03/17/2007   Essential hypertension 01/01/2007   ANXIETY STATE NOS 09/11/2005    Past Surgical History:  Procedure Laterality Date   ABDOMINAL HYSTERECTOMY     BOWEL RESECTION N/A 03/06/2018   Procedure: SMALL BOWEL ANASTAMOSIS;  Surgeon: Clovis Riley, MD;  Location: [place];  Service: General;  Laterality: N/A;   CATARACT EXTRACTION W/ INTRAOCULAR LENS IMPLANT Bilateral    CESAREAN SECTION  yrs ago   done x 2   CHOLECYSTECTOMY  01/05/2012   Procedure: LAPAROSCOPIC  CHOLECYSTECTOMY WITH INTRAOPERATIVE CHOLANGIOGRAM;  Surgeon: Pedro Earls, MD;  Location: [facility];  Service: General;  Laterality: N/A;   COLONOSCOPY  10/08/2012   Procedure: COLONOSCOPY;  Surgeon: Beryle Beams, MD;  Location: [facility];  Service: Endoscopy;  Laterality: N/A;   KNEE ARTHROSCOPY  one 1995 and 1 in 1997   both knees done   LAPAROSCOPY N/A 03/06/2018   Procedure: LAPAROSCOPY DIAGNOSTIC WITH LYSIS OF ADHESIONS;  Surgeon: Clovis Riley, MD;  Location: [place];  Service: General;  Laterality: N/A;   LAPAROTOMY N/A 03/06/2018   Procedure: EXPLORATORY LAPAROTOMY, RESECTION OF DISTAL ROUX, CLOSURE OF INTERNAL HERNIA;  Surgeon: Clovis Riley, MD;  Location: [place];  Service: General;  Laterality: N/A;   LEFT HEART CATH AND CORONARY ANGIOGRAPHY N/A 08/29/2019   Procedure: LEFT HEART CATH AND CORONARY ANGIOGRAPHY;  Surgeon: Charolette Forward, MD;  Location: [place] [facility];  Service: Cardiovascular;  Laterality: N/A;   LUMBAR WOUND DEBRIDEMENT N/A 04/03/2019   Procedure: LUMBAR WOUND WASHOUT;  Surgeon: Judith Part, MD;  Location: [place];  Service: Neurosurgery;  Laterality: N/A;   POSTERIOR LUMBAR FUSION  03/04/2019   surgery for endometriosis  yrs ago   TEE WITHOUT CARDIOVERSION N/A 03/22/2021   Procedure: TRANSESOPHAGEAL ECHOCARDIOGRAM (TEE);  Surgeon: Skeet Latch, MD;  [facility] END[facility];  Service: Cardiovascular;  Laterality: N/A;   thryoid biopsy  December 01, 2011    at [facility]     OB History   No obstetric history on file.     Family History  Problem Relation Age of Onset   Diabetes Mother    Hypertension Mother    Transient ischemic attack Mother    Seizures Mother    Dementia Mother    Other Father        MVA   Cancer Brother        colon and lung   Cancer Maternal Grandmother        colon    Social History   Tobacco Use   Smoking status: Never   Smokeless tobacco: Never  Vaping Use   Vaping Use: Never used  Substance Use Topics   Alcohol  use: Not Currently    Alcohol/week: 0.0 standard drinks   Drug use: No    Home Medications Prior to Admission medications   Medication Sig Start Date End Date Taking? Authorizing Provider  albuterol (VENTOLIN HFA) 108 (90 Base) MCG/ACT inhaler Inhale 1-2 puffs into the lungs every 6 (six) hours as needed for wheezing or shortness of breath.    [provider]  aspirin EC 81 MG tablet Take 81 mg by mouth daily.    [provider]  clopidogrel (PLAVIX) 75 MG tablet Take 1 tablet (75 mg total) by mouth  daily. 03/22/21 06/20/21  [place] Donnamarie Poag, DO  D3-50 1.25 MG (50000 UT) capsule Take 50,000 Units by mouth 3 (three) times a week. 05/06/20   [provider]  diclofenac Sodium (VOLTAREN) 1 % GEL Apply 4 g topically 2 (two) times daily as needed (back pain).    [provider]  DULoxetine (CYMBALTA) 60 MG capsule Take 60 mg by mouth daily. 08/11/19   [provider]  escitalopram (LEXAPRO) 10 MG tablet Take 10 mg by mouth at bedtime. 05/04/20   [provider]  ferrous sulfate 325 (65 FE) MG EC tablet Take 325 mg by mouth daily. 07/26/19   [provider]  fluticasone (FLONASE) 50 MCG/ACT nasal spray Place 1 spray into both nostrils daily as needed for allergies.  11/08/17   [provider]  folic acid (FOLVITE) 1 MG tablet Take 1 mg by mouth daily. 10/14/17   [provider]  HYDROcodone-acetaminophen (NORCO) 10-325 MG tablet Take 1 tablet by mouth 2 (two) times daily as needed for moderate pain or severe pain. 02/16/20   [provider]  isosorbide mononitrate (IMDUR) 30 MG 24 hr tablet Take 1 tablet (30 mg total) by mouth daily. 11/18/19   Corena Herter, PA-C  loratadine (CLARITIN) 10 MG tablet Take 10 mg by mouth daily.     [provider]  Magnesium Oxide 400 MG CAPS Take 1 capsule (400 mg total) by mouth daily. 09/28/19   Carlisle Cater, PA-C  metoprolol succinate (TOPROL-XL) 100 MG 24 hr tablet Take  100 mg by mouth at bedtime.  07/10/19   [provider]  mirtazapine (REMERON) 7.5 MG tablet Take 7.5 mg by mouth at bedtime. 07/30/20   [provider]  morphine (MS CONTIN) 30 MG 12 hr tablet Take 30 mg by mouth every 12 (twelve) hours. 06/14/20   [provider]  ondansetron (ZOFRAN-ODT) 4 MG disintegrating tablet Take 4 mg by mouth every 8 (eight) hours as needed for nausea/vomiting. 06/07/20   [provider]  pantoprazole (PROTONIX) 40 MG tablet Take 1 tablet (40 mg total) by mouth 2 (two) times daily. 03/15/19   Angiulli, Lavon Paganini, PA-C  polyethylene glycol (MIRALAX / GLYCOLAX) 17 g packet Take 17 g by mouth daily as needed for mild constipation. 03/08/19   Viona Gilmore D, NP  pregabalin (LYRICA) 75 MG capsule Take 1 capsule (75 mg total) by mouth 3 (three) times daily. 03/15/19   Angiulli, Lavon Paganini, PA-C  spironolactone (ALDACTONE) 25 MG tablet Take 25 mg by mouth daily.    [provider]  metoprolol (LOPRESSOR) 50 MG tablet Take 50 mg by mouth 2 (two) times daily.    12/04/11  [provider]  simvastatin (ZOCOR) 20 MG tablet Take 20 mg by mouth at bedtime.    12/04/11  [provider]    Allergies    Shrimp [shellfish allergy], Naproxen, and Shellfish-derived products  Review of Systems   Review of Systems 10 systems reviewed and negative except as per HPI Physical Exam Updated Vital Signs BP 125/77 (BP Location: Left Arm)   Pulse 66   Temp 97.8 F (36.6 C) (Oral)   Resp 16   SpO2 90%   Physical Exam Constitutional:      Comments: Alert with clear mental status.  No respiratory distress.  HENT:     Head:     Comments: Possible subtle residual swelling of the right side of the face relative to the left.  Patient appears to have subtle  puffy edema of the face generally.  No large edema of the eyes or any swelling around the oral cavity.    Nose: Nose normal.  Eyes:     Extraocular Movements: Extraocular movements  intact.  Cardiovascular:     Rate and Rhythm: Normal rate and regular rhythm.  Pulmonary:     Effort: Pulmonary effort is normal.     Breath sounds: Normal breath sounds.  Abdominal:     General: There is no distension.     Palpations: Abdomen is soft.     Tenderness: There is no abdominal tenderness. There is no guarding.  Musculoskeletal:     Comments: Left upper extremity has about 1+ edema.  There are some granulating of the skin consistent with larger prior edema.  Patient is neurovascularly intact.  There is a bullous ulcer on the forearm see attached image.  Patient has about 1+ edema of the left lower extremity.  Trace edema right lower extremity.  No calf tenderness.  Skin:    General: Skin is warm and dry.  Neurological:     General: No focal deficit present.     Mental Status: She is oriented to person, place, and time.     Coordination: Coordination normal.  Psychiatric:        Mood and Affect: Mood normal.    ED Results / Procedures / Treatments   Labs (all labs ordered are listed, but only abnormal results are displayed) Labs Reviewed  BASIC METABOLIC PANEL - Abnormal; Notable for the following components:      Result Value   Potassium 3.0 (*)    Chloride 112 (*)    Glucose, Bld 66 (*)    Calcium 7.7 (*)    Anion gap 3 (*)    All other components within normal limits  CBC - Abnormal; Notable for the following components:   RBC 3.19 (*)    Hemoglobin 9.9 (*)    HCT 30.4 (*)    RDW 16.1 (*)    All other components within normal limits  TROPONIN I (HIGH SENSITIVITY) - Abnormal; Notable for the following components:   Troponin I (High Sensitivity) 18 (*)    All other components within normal limits  TROPONIN I (HIGH SENSITIVITY)    EKG EKG Interpretation  Date/Time:  Monday April 15 2021 14:19:40 EDT Ventricular Rate:  57 PR Interval:  127 QRS Duration: 84 QT Interval:  503 QTC Calculation: 490 R Axis:   73 Text Interpretation: Sinus rhythm Low  voltage, precordial leads Borderline T wave abnormalities no sig change from previous Confirmed by Charlesetta Shanks [phone]) on 04/15/2021 4:09:05 PM  Radiology DG Chest 2 View  Result Date: 04/15/2021 CLINICAL DATA:  Chest pain EXAM: CHEST - 2 VIEW COMPARISON:  08/27/2020 FINDINGS: Cardiac shadow is stable. Spinal stimulator is now seen in place. The lungs are well aerated bilaterally. No acute bony abnormality is seen. IMPRESSION: No active cardiopulmonary disease. Electronically Signed   By: Inez Catalina M.D.   On: 04/15/2021 10:43    Procedures Procedures   Medications Ordered in ED Medications - No data to display  ED Course  I have reviewed the triage vital signs and the nursing notes.  Pertinent labs & imaging results that were available during my care of the patient were reviewed by me and considered in my medical decision making (see chart for details).    MDM Rules/Calculators/A&P  Patient presents with atypical pattern of extremity swelling facial swelling.  She does not have any airway impingement.  No signs of respiratory distress or hypoxia.  Patient however does report some shortness of breath and atypical and asymmetric extremity swelling.  At this time we will proceed with CT scan to rule out PE, vascular impingement type syndrome or other possible mass affecting lymphatic system.  Patient is nontoxic and if diagnostic evaluations do not show urgent emergent condition, stable for discharge anticipated.  Dr. Jeanell Sparrow to follow-up on CT results.  Final Clinical Impression(s) / ED Diagnoses Final diagnoses:  None    Rx / DC Orders ED Discharge Orders     None        Charlesetta Shanks, MD 04/15/21 1650

## 2021-04-15 NOTE — Discharge Instructions (Addendum)
On your chest CT, 1 pulmonary embolism was noted.  This is a blood clot in the lung.  You need to be started on blood thinners for this.  You have some low blood count at baseline.  You will need to have close follow-up with your doctor.  Please call tomorrow morning for recheck this week. Return to the emergency department if you have chest pain or shortness of breath.  Return if you are having any other worsening symptoms. You were also noted to have some low potassium and you were given a dose of potassium here in the ED.  Please have your potassium rechecked with your doctor this week   Information on my medicine - ELIQUIS (apixaban)   Why was Eliquis prescribed for you? Eliquis was prescribed to treat blood clots that may have been found in the veins of your legs (deep vein thrombosis) or in your lungs (pulmonary embolism) and to reduce the risk of them occurring again.  What do You need to know about Eliquis ? The starting dose is 10 mg (two 5 mg tablets) taken TWICE daily for the FIRST SEVEN (7) DAYS, then on 04/23/21 the dose is reduced to ONE 5 mg tablet taken TWICE daily.  Eliquis may be taken with or without food.   Try to take the dose about the same time in the morning and in the evening. If you have difficulty swallowing the tablet whole please discuss with your pharmacist how to take the medication safely.  Take Eliquis exactly as prescribed and DO NOT stop taking Eliquis without talking to the doctor who prescribed the medication.  Stopping may increase your risk of developing a new blood clot.  Refill your prescription before you run out.  After discharge, you should have regular check-up appointments with your healthcare provider that is prescribing your Eliquis.    What do you do if you miss a dose? If a dose of ELIQUIS is not taken at the scheduled time, take it as soon as possible on the same day and twice-daily administration should be resumed. The dose should not  be doubled to make up for a missed dose.  Important Safety Information A possible side effect of Eliquis is bleeding. You should call your healthcare provider right away if you experience any of the following: Bleeding from an injury or your nose that does not stop. Unusual colored urine (red or dark brown) or unusual colored stools (red or black). Unusual bruising for unknown reasons. A serious fall or if you hit your head (even if there is no bleeding).  Some medicines may interact with Eliquis and might increase your risk of bleeding or clotting while on Eliquis. To help avoid this, consult your healthcare provider or pharmacist prior to using any new prescription or non-prescription medications, including herbals, vitamins, non-steroidal anti-inflammatory drugs (NSAIDs) and supplements.  This website has more information on Eliquis (apixaban): http://www.eliquis.com/eliquis/home

## 2021-04-15 NOTE — ED Notes (Signed)
Pt transported to CT ?

## 2021-04-15 NOTE — ED Provider Notes (Signed)
  Physical Exam  BP (!) 141/94   Pulse 68   Temp 97.8 F (36.6 C) (Oral)   Resp 15   SpO2 95%   Physical Exam  ED Course/Procedures     Procedures  MDM  60 year old female received in handoff from Dr. Vallery Ridge.  She presented today complaining intermittent swelling of her left hand and lower extremities.  She has also been having some rash over the past 6 months.  She has been seen by her primary care doctor for this rash.  She states that the swelling in the left hand began today but has since resolved.  She also felt she had some swelling the right side of her face over the weekend which has also resolved.  The lower extremity swelling is bilateral and she has been taking Parona lactone from her doctor for this for the past 2 months without relief. Well-developed well-nourished female who appears somewhat older than her stated age vital signs are stable lungs are clear auscultation heart is regular rhythm Face appears symmetrical Extremities reveal some mild swelling on the dorsal aspect of the left hand Abdomen soft but seems somewhat diffusely tender through the lower abdomen area Bilateral lower extremities reveal some pitting edema Patient is having CT of chest and abdomen The results of these, patient will be discharged home if no acute findings  Received call from radiologist.  Patient has small subsegmental PE on right otherwise normal chest CT.  There is some anasarca noted at the abdomen but no other acute abnormalities. Plan anticoagulation.  Will consult pharmacist.  Patient appears stable and can be treated as an outpatient.  We discussed anticoagulation, need for close follow-up, return precautions, and she voices understanding.     Pattricia Boss, MD 04/15/21 [phone]

## 2021-04-29 ENCOUNTER — Emergency Department (HOSPITAL_COMMUNITY)
Admission: EM | Admit: 2021-04-29 | Discharge: 2021-04-29 | Disposition: A | Discharge status: Home / Self Care | Payer: Medicare (Managed Care) | Attending: Emergency Medicine | Admitting: Emergency Medicine

## 2021-04-29 ENCOUNTER — Encounter (HOSPITAL_COMMUNITY): Payer: Self-pay

## 2021-04-29 ENCOUNTER — Other Ambulatory Visit: Payer: Self-pay

## 2021-04-29 ENCOUNTER — Emergency Department (HOSPITAL_COMMUNITY): Payer: Medicare (Managed Care)

## 2021-04-29 DIAGNOSIS — I10 Essential (primary) hypertension: Secondary | ICD-10-CM | POA: Insufficient documentation

## 2021-04-29 DIAGNOSIS — Z79899 Other long term (current) drug therapy: Secondary | ICD-10-CM | POA: Insufficient documentation

## 2021-04-29 DIAGNOSIS — R609 Edema, unspecified: Secondary | ICD-10-CM

## 2021-04-29 DIAGNOSIS — R0602 Shortness of breath: Secondary | ICD-10-CM | POA: Diagnosis not present

## 2021-04-29 DIAGNOSIS — R600 Localized edema: Secondary | ICD-10-CM | POA: Insufficient documentation

## 2021-04-29 DIAGNOSIS — R001 Bradycardia, unspecified: Secondary | ICD-10-CM | POA: Diagnosis not present

## 2021-04-29 DIAGNOSIS — Z7982 Long term (current) use of aspirin: Secondary | ICD-10-CM | POA: Diagnosis not present

## 2021-04-29 DIAGNOSIS — R220 Localized swelling, mass and lump, head: Secondary | ICD-10-CM | POA: Insufficient documentation

## 2021-04-29 DIAGNOSIS — M7989 Other specified soft tissue disorders: Secondary | ICD-10-CM | POA: Diagnosis present

## 2021-04-29 DIAGNOSIS — L03211 Cellulitis of face: Secondary | ICD-10-CM

## 2021-04-29 LAB — COMPREHENSIVE METABOLIC PANEL
ALT: 36 U/L (ref 0–44)
AST: 40 U/L (ref 15–41)
Albumin: 2 g/dL — ABNORMAL LOW (ref 3.5–5.0)
Alkaline Phosphatase: 71 U/L (ref 38–126)
Anion gap: 5 (ref 5–15)
BUN: 19 mg/dL (ref 8–23)
CO2: 30 mmol/L (ref 22–32)
Calcium: 7.9 mg/dL — ABNORMAL LOW (ref 8.9–10.3)
Chloride: 106 mmol/L (ref 98–111)
Creatinine, Ser: 1.16 mg/dL — ABNORMAL HIGH (ref 0.44–1.00)
GFR, Estimated: 54 mL/min — ABNORMAL LOW (ref >60)
Glucose, Bld: 121 mg/dL — ABNORMAL HIGH (ref 70–99)
Potassium: 3 mmol/L — ABNORMAL LOW (ref 3.5–5.1)
Sodium: 141 mmol/L (ref 135–145)
Total Bilirubin: 0.6 mg/dL (ref 0.3–1.2)
Total Protein: 4.9 g/dL — ABNORMAL LOW (ref 6.5–8.1)

## 2021-04-29 LAB — CBC WITH DIFFERENTIAL/PLATELET
Abs Immature Granulocytes: 0.05 10*3/uL (ref 0.00–0.07)
Basophils Absolute: 0 10*3/uL (ref 0.0–0.1)
Basophils Relative: 0 %
Eosinophils Absolute: 0 10*3/uL (ref 0.0–0.5)
Eosinophils Relative: 0 %
HCT: 35.3 % — ABNORMAL LOW (ref 36.0–46.0)
Hemoglobin: 11.7 g/dL — ABNORMAL LOW (ref 12.0–15.0)
Immature Granulocytes: 1 %
Lymphocytes Relative: 21 %
Lymphs Abs: 2.1 10*3/uL (ref 0.7–4.0)
MCH: 30.9 pg (ref 26.0–34.0)
MCHC: 33.1 g/dL (ref 30.0–36.0)
MCV: 93.1 fL (ref 80.0–100.0)
Monocytes Absolute: 0.9 10*3/uL (ref 0.1–1.0)
Monocytes Relative: 9 %
Neutro Abs: 7.3 10*3/uL (ref 1.7–7.7)
Neutrophils Relative %: 69 %
Platelets: 204 10*3/uL (ref 150–400)
RBC: 3.79 MIL/uL — ABNORMAL LOW (ref 3.87–5.11)
RDW: 16.8 % — ABNORMAL HIGH (ref 11.5–15.5)
WBC: 10.4 10*3/uL (ref 4.0–10.5)
nRBC: 0 % (ref 0.0–0.2)

## 2021-04-29 LAB — TROPONIN I (HIGH SENSITIVITY)
Troponin I (High Sensitivity): 17 ng/L (ref <18)
Troponin I (High Sensitivity): 17 ng/L (ref <18)

## 2021-04-29 LAB — LACTIC ACID, PLASMA: Lactic Acid, Venous: 2 mmol/L (ref 0.5–1.9)

## 2021-04-29 LAB — PROTIME-INR
INR: 1.6 — ABNORMAL HIGH (ref 0.8–1.2)
Prothrombin Time: 19.4 seconds — ABNORMAL HIGH (ref 11.4–15.2)

## 2021-04-29 LAB — BRAIN NATRIURETIC PEPTIDE: B Natriuretic Peptide: 82.5 pg/mL (ref 0.0–100.0)

## 2021-04-29 LAB — LIPASE, BLOOD: Lipase: 20 U/L (ref 11–51)

## 2021-04-29 MED ORDER — POTASSIUM CHLORIDE CRYS ER 20 MEQ PO TBCR
40.0000 meq | EXTENDED_RELEASE_TABLET | Freq: Once | ORAL | Status: AC
  Administered 2021-04-29: 40 meq via ORAL
  Filled 2021-04-29: qty 2

## 2021-04-29 MED ORDER — ERYTHROMYCIN 5 MG/GM OP OINT: TOPICAL_OINTMENT | OPHTHALMIC | 0 refills | Status: AC

## 2021-04-29 MED ORDER — MAGNESIUM SULFATE 2 GM/50ML IV SOLN
2.0000 g | Freq: Once | INTRAVENOUS | Status: AC
  Administered 2021-04-29: 2 g via INTRAVENOUS
  Filled 2021-04-29: qty 50

## 2021-04-29 MED ORDER — POTASSIUM CHLORIDE 10 MEQ/100ML IV SOLN
10.0000 meq | Freq: Once | INTRAVENOUS | Status: AC
  Administered 2021-04-29: 10 meq via INTRAVENOUS
  Filled 2021-04-29: qty 100

## 2021-04-29 MED ORDER — IOHEXOL 300 MG/ML  SOLN
75.0000 mL | Freq: Once | INTRAMUSCULAR | Status: AC | PRN
  Administered 2021-04-29: 75 mL via INTRAVENOUS

## 2021-04-29 MED ORDER — CLINDAMYCIN HCL 300 MG PO CAPS: 300.0000 mg | ORAL_CAPSULE | Freq: Four times a day (QID) | ORAL | 0 refills | Status: AC

## 2021-04-29 MED ORDER — SPIRONOLACTONE 25 MG PO TABS: 25.0000 mg | ORAL_TABLET | Freq: Every day | ORAL | 0 refills | Status: AC

## 2021-04-29 NOTE — ED Provider Notes (Signed)
Emergency Medicine Provider Triage Evaluation Note  Jodi Bowman , a 60 y.o. female  was evaluated in triage.  Pt complains of shortness of breath, lower extremity edema for the last 2 weeks and now left eye swelling without redness, warmth of the touch, or pain.  History of the same which has been self resolving in the past..  Review of Systems  Positive: Shortness of breath, leg swelling, facial swelling, abdominal pain Negative: Fevers, chills, nausea, vomiting, diarrhea  Physical Exam  BP 135/85 (BP Location: Right Arm)   Pulse 60   Temp 97.8 F (36.6 C) (Oral)   Resp 18   SpO2 93%  Gen:   Awake, no distress   Resp:  Normal effort  MSK:   Moves extremities without difficulty  Other:  Rhonchi and diminished breath sounds throughout the lung fields bilaterally.  RRR no M/R/G.  Left periorbital edema without erythema, warmth to touch, or drainage.  PERRL.  Patient unable to open the eye on the left, however is able to be opened by this provider manually.  Medical Decision Making  Medically screening exam initiated at 12:30 PM.  Appropriate orders placed.  Jodi Bowman was informed that the remainder of the evaluation will be completed by another provider, this initial triage assessment does not replace that evaluation, and the importance of remaining in the ED until their evaluation is complete.  This chart was dictated using voice recognition software, Dragon. Despite the best efforts of this provider to proofread and correct errors, errors may still occur which can change documentation meaning.    Aura Dials 04/29/21 1231    Regan Lemming, MD 04/29/21 2009

## 2021-04-29 NOTE — ED Notes (Signed)
Patient verbalizes understanding of discharge instructions. Opportunity for questioning and answers were provided. Armband removed by staff, pt discharged from ED via wheelchair.  

## 2021-04-29 NOTE — ED Provider Notes (Signed)
[facility] Provider Note   CSN: [id number] Arrival date & time: 04/29/21  1202     History Chief Complaint  Patient presents with   Facial Swelling   Shortness of Breath   Leg Swelling    Jodi Bowman is a 60 y.o. female with a past medical history of hypertension, GERD, prior stroke presenting to the ED with a chief complaint of leg swelling and facial swelling.  Has been having intermittent bilateral lower extremity edema for the past month.  She had some swelling in her arms yesterday but then woke up this morning with swelling to the left side of her face.  States that specifically around her left eye is swollen and painful.  She is able to move her eye but states that she is having pain with doing so.  No injury or trauma to the area.  No fever, chest pain or shortness of breath.  She has been compliant with her medications including her diuretic.  Denies any loss of vision or blurry vision, drainage from the area.  HPI     Past Medical History:  Diagnosis Date   Arthritis    Calculus of gallbladder without mention of cholecystitis or obstruction    Dizziness    Fibromyalgia    GERD (gastroesophageal reflux disease)    Goiter    Hypercholesteremia    Hypertension    Lower back pain    Migraine    Nontoxic uninodular goiter    sees dr vollmer at [employer] [facility]   Obesity    PONV (postoperative nausea and vomiting)    Sleep apnea    STOPBANG=5   Stroke [facility]) 2000    Patient Active Problem List   Diagnosis Date Noted   Dysphagia    Mood disorder ([place]) 03/12/2021   Weakness 08/27/2020   Mental status change resolved 08/27/2020   Weakness of left side of body 06/29/2020   ACS (acute coronary syndrome) ([place]) 08/25/2019   Chest pain 08/25/2019   Postoperative seroma involving nervous system after nervous system procedure 04/05/2019   Drainage from wound 04/02/2019   Infective otitis externa of left ear    Left otitis media     Urinary retention    Orthostasis    Acute blood loss anemia    Fibromyalgia    Chronic pain syndrome    Lumbar radiculopathy 03/08/2019   Orthostatic hypotension 03/06/2019   Postoperative urinary retention 03/06/2019   Lumbar foraminal stenosis 03/04/2019   S/P exploratory laparotomy 03/06/2018   Bowel obstruction ([place]) 03/06/2018   Chest pain, rule out acute myocardial infarction 12/17/2017   Hypokalemia 12/17/2017   Acute lower UTI 12/17/2017   Vertigo 04/30/2017   Small vessel disease, cerebrovascular 04/30/2017   Chronic low back pain 08/01/2015   Right hip pain 08/01/2015   Abnormality of gait 08/01/2015   Left leg weakness    Low back pain with radiation    Syncope 07/30/2014   Atypical chest pain 03/12/2014   Lap chole IOC April 2013 01/29/2012   Gallstones 12/04/2011   Thyroid nodule-non neoplastic goiter by needle aspiration 12/04/2011   GLUCOSE INTOLERANCE 03/12/2010   DYSLIPIDEMIA 03/12/2010   Chronic migraine 03/12/2010   Carotid stenosis 03/12/2010   CEREBROVASCULAR ACCIDENT 03/12/2010   LIPOMA 01/29/2010   HEADACHE 01/29/2010   ANKLE INJURY, RIGHT 04/12/2009   PHARYNGITIS 03/21/2009   Backache 03/01/2009   ALLERGIC RHINITIS 03/17/2007   LOW BACK PAIN 03/17/2007   Essential hypertension 01/01/2007   ANXIETY STATE  NOS 09/11/2005    Past Surgical History:  Procedure Laterality Date   ABDOMINAL HYSTERECTOMY     BOWEL RESECTION N/A 03/06/2018   Procedure: SMALL BOWEL ANASTAMOSIS;  Surgeon: Clovis Riley, MD;  Location: [place];  Service: General;  Laterality: N/A;   CATARACT EXTRACTION W/ INTRAOCULAR LENS IMPLANT Bilateral    CESAREAN SECTION  yrs ago   done x 2   CHOLECYSTECTOMY  01/05/2012   Procedure: LAPAROSCOPIC CHOLECYSTECTOMY WITH INTRAOPERATIVE CHOLANGIOGRAM;  Surgeon: Pedro Earls, MD;  Location: [facility];  Service: General;  Laterality: N/A;   COLONOSCOPY  10/08/2012   Procedure: COLONOSCOPY;  Surgeon: Beryle Beams, MD;  Location: [facility]  ENDOSCOPY;  Service: Endoscopy;  Laterality: N/A;   KNEE ARTHROSCOPY  one 1995 and 1 in 1997   both knees done   LAPAROSCOPY N/A 03/06/2018   Procedure: LAPAROSCOPY DIAGNOSTIC WITH LYSIS OF ADHESIONS;  Surgeon: Clovis Riley, MD;  Location: [place];  Service: General;  Laterality: N/A;   LAPAROTOMY N/A 03/06/2018   Procedure: EXPLORATORY LAPAROTOMY, RESECTION OF DISTAL ROUX, CLOSURE OF INTERNAL HERNIA;  Surgeon: Clovis Riley, MD;  Location: [place];  Service: General;  Laterality: N/A;   LEFT HEART CATH AND CORONARY ANGIOGRAPHY N/A 08/29/2019   Procedure: LEFT HEART CATH AND CORONARY ANGIOGRAPHY;  Surgeon: Charolette Forward, MD;  Location: [place] [facility];  Service: Cardiovascular;  Laterality: N/A;   LUMBAR WOUND DEBRIDEMENT N/A 04/03/2019   Procedure: LUMBAR WOUND WASHOUT;  Surgeon: Judith Part, MD;  Location: [place];  Service: Neurosurgery;  Laterality: N/A;   POSTERIOR LUMBAR FUSION  03/04/2019   surgery for endometriosis  yrs ago   TEE WITHOUT CARDIOVERSION N/A 03/22/2021   Procedure: TRANSESOPHAGEAL ECHOCARDIOGRAM (TEE);  Surgeon: Skeet Latch, MD;  Location: [facility] ENDOSCOPY;  Service: Cardiovascular;  Laterality: N/A;   thryoid biopsy  December 01, 2011    at [facility]     OB History   No obstetric history on file.     Family History  Problem Relation Age of Onset   Diabetes Mother    Hypertension Mother    Transient ischemic attack Mother    Seizures Mother    Dementia Mother    Other Father        MVA   Cancer Brother        colon and lung   Cancer Maternal Grandmother        colon    Social History   Tobacco Use   Smoking status: Never   Smokeless tobacco: Never  Vaping Use   Vaping Use: Never used  Substance Use Topics   Alcohol use: Not Currently    Alcohol/week: 0.0 standard drinks   Drug use: No    Home Medications Prior to Admission medications   Medication Sig Start Date End Date Taking? Authorizing Provider  albuterol (VENTOLIN HFA) 108 (90 Base)  MCG/ACT inhaler Inhale 1-2 puffs into the lungs every 6 (six) hours as needed for wheezing or shortness of breath.    [provider]  APIXABAN Arne [place]) VTE STARTER PACK ('10MG'$  AND '5MG'$ ) Take as directed on package: start with two-'5mg'$  tablets twice daily for 7 days. On day 8, switch to one-'5mg'$  tablet twice daily. 04/15/21   Pattricia Boss, MD  aspirin EC 81 MG tablet Take 81 mg by mouth daily.    [provider]  D3-50 1.25 MG (50000 UT) capsule Take 50,000 Units by mouth 3 (three) times a week. 05/06/20   [provider]  diclofenac  Sodium (VOLTAREN) 1 % GEL Apply 4 g topically 2 (two) times daily as needed (back pain).    [provider]  DULoxetine (CYMBALTA) 60 MG capsule Take 60 mg by mouth daily. 08/11/19   [provider]  escitalopram (LEXAPRO) 10 MG tablet Take 10 mg by mouth at bedtime. 05/04/20   [provider]  ferrous sulfate 325 (65 FE) MG EC tablet Take 325 mg by mouth daily. 07/26/19   [provider]  fluticasone (FLONASE) 50 MCG/ACT nasal spray Place 1 spray into both nostrils daily as needed for allergies.  11/08/17   [provider]  folic acid (FOLVITE) 1 MG tablet Take 1 mg by mouth daily. 10/14/17   [provider]  HYDROcodone-acetaminophen (NORCO) 10-325 MG tablet Take 1 tablet by mouth 2 (two) times daily as needed for moderate pain or severe pain. 02/16/20   [provider]  isosorbide mononitrate (IMDUR) 30 MG 24 hr tablet Take 1 tablet (30 mg total) by mouth daily. 11/18/19   Corena Herter, PA-C  loratadine (CLARITIN) 10 MG tablet Take 10 mg by mouth daily.     [provider]  Magnesium Oxide 400 MG CAPS Take 1 capsule (400 mg total) by mouth daily. 09/28/19   Carlisle Cater, PA-C  metoprolol succinate (TOPROL-XL) 100 MG 24 hr tablet Take 100 mg by mouth at bedtime.  07/10/19   [provider]  mirtazapine (REMERON) 7.5 MG tablet Take 7.5 mg by mouth at bedtime. 07/30/20    [provider]  morphine (MS CONTIN) 30 MG 12 hr tablet Take 30 mg by mouth every 12 (twelve) hours. 06/14/20   [provider]  ondansetron (ZOFRAN-ODT) 4 MG disintegrating tablet Take 4 mg by mouth every 8 (eight) hours as needed for nausea/vomiting. 06/07/20   [provider]  pantoprazole (PROTONIX) 40 MG tablet Take 1 tablet (40 mg total) by mouth 2 (two) times daily. 03/15/19   Angiulli, Lavon Paganini, PA-C  polyethylene glycol (MIRALAX / GLYCOLAX) 17 g packet Take 17 g by mouth daily as needed for mild constipation. 03/08/19   Viona Gilmore D, NP  pregabalin (LYRICA) 75 MG capsule Take 1 capsule (75 mg total) by mouth 3 (three) times daily. 03/15/19   Angiulli, Lavon Paganini, PA-C  spironolactone (ALDACTONE) 25 MG tablet Take 25 mg by mouth daily.    [provider]  metoprolol (LOPRESSOR) 50 MG tablet Take 50 mg by mouth 2 (two) times daily.    12/04/11  [provider]  simvastatin (ZOCOR) 20 MG tablet Take 20 mg by mouth at bedtime.    12/04/11  [provider]    Allergies    Shrimp [shellfish allergy], Naproxen, and Shellfish-derived products  Review of Systems   Review of Systems  Constitutional:  Negative for appetite change, chills and fever.  HENT:  Positive for facial swelling. Negative for ear pain, rhinorrhea, sneezing and sore throat.   Eyes:  Negative for photophobia and visual disturbance.  Respiratory:  Negative for cough, chest tightness, shortness of breath and wheezing.   Cardiovascular:  Positive for leg swelling. Negative for chest pain and palpitations.  Gastrointestinal:  Negative for abdominal pain, blood in stool, constipation, diarrhea, nausea and vomiting.  Genitourinary:  Negative for dysuria, hematuria and urgency.  Musculoskeletal:  Negative for myalgias.  Skin:  Negative for rash.  Neurological:  Negative for dizziness, weakness and light-headedness.   Physical Exam Updated Vital Signs BP (!) 111/98   Pulse (!) 57  Temp 97.6 F (36.4 C) (Oral)   Resp 14   SpO2 99%   Physical Exam Vitals and nursing note reviewed.  Constitutional:      General: She is not in acute distress.    Appearance: She is well-developed.  HENT:     Head: Normocephalic and atraumatic.     Nose: Nose normal.  Eyes:     General: No scleral icterus.       Left eye: No discharge.     Conjunctiva/sclera: Conjunctivae normal.     Comments: L periorbital edema.  Mild tenderness to palpation noted.  Able to open her eye and EOMs are intact.  Pupils are equal and reactive to light bilaterally.  Cardiovascular:     Rate and Rhythm: Normal rate and regular rhythm.     Heart sounds: Normal heart sounds. No murmur heard.   No friction rub. No gallop.  Pulmonary:     Effort: Pulmonary effort is normal. No respiratory distress.     Breath sounds: Normal breath sounds.  Abdominal:     General: Bowel sounds are normal. There is no distension.     Palpations: Abdomen is soft.     Tenderness: There is no abdominal tenderness. There is no guarding.  Musculoskeletal:        General: Normal range of motion.     Cervical back: Normal range of motion and neck supple.     Right lower leg: Edema present.     Left lower leg: Edema present.     Comments: 2+ Pitting edema noted to bilateral lower extremities.  Skin:    General: Skin is warm and dry.     Findings: No rash.  Neurological:     Mental Status: She is alert.     Motor: No abnormal muscle tone.     Coordination: Coordination normal.    ED Results / Procedures / Treatments   Labs (all labs ordered are listed, but only abnormal results are displayed) Labs Reviewed  COMPREHENSIVE METABOLIC PANEL - Abnormal; Notable for the following components:      Result Value   Potassium 3.0 (*)    Glucose, Bld 121 (*)    Creatinine, Ser 1.16 (*)    Calcium 7.9 (*)    Total Protein 4.9 (*)    Albumin 2.0 (*)    GFR, Estimated 54 (*)    All other components within normal limits   CBC WITH DIFFERENTIAL/PLATELET - Abnormal; Notable for the following components:   RBC 3.79 (*)    Hemoglobin 11.7 (*)    HCT 35.3 (*)    RDW 16.8 (*)    All other components within normal limits  LACTIC ACID, PLASMA - Abnormal; Notable for the following components:   Lactic Acid, Venous 2.0 (*)    All other components within normal limits  PROTIME-INR - Abnormal; Notable for the following components:   Prothrombin Time 19.4 (*)    INR 1.6 (*)    All other components within normal limits  LIPASE, BLOOD  BRAIN NATRIURETIC PEPTIDE  URINALYSIS, ROUTINE W REFLEX MICROSCOPIC  TROPONIN I (HIGH SENSITIVITY)  TROPONIN I (HIGH SENSITIVITY)    EKG EKG Interpretation  Date/Time:  Monday April 29 2021 12:18:15 EDT Ventricular Rate:  57 PR Interval:  132 QRS Duration: 72 QT Interval:  492 QTC Calculation: 478 R Axis:   41 Text Interpretation: Sinus bradycardia Low voltage QRS Nonspecific T wave abnormality Prolonged QT Abnormal ECG t wave flattening Otherwise no significant change Confirmed by  Deno Etienne [phone]) on 04/29/2021 6:46:26 PM  Radiology DG Chest 2 View  Result Date: 04/29/2021 CLINICAL DATA:  Shortness of breath, chest pain EXAM: CHEST - 2 VIEW COMPARISON:  04/15/2021 CT a chest and chest radiograph from 04/15/2021 FINDINGS: Similar appearance of dorsal column stimulator with left-sided leads from T7-8 through T9-10, and right-sided leads from T12. The lungs appear clear. Cardiac and mediastinal contours normal. No pleural effusion identified. IMPRESSION: 1.  No active cardiopulmonary disease is radiographically apparent. Electronically Signed   By: Van Clines M.D.   On: 04/29/2021 13:15    Procedures Procedures   Medications Ordered in ED Medications  potassium chloride 10 mEq in 100 mL IVPB (has no administration in time range)  potassium chloride SA (KLOR-CON) CR tablet 40 mEq (has no administration in time range)  magnesium sulfate IVPB 2 g 50 mL (has no  administration in time range)    ED Course  I have reviewed the triage vital signs and the nursing notes.  Pertinent labs & imaging results that were available during my care of the patient were reviewed by me and considered in my medical decision making (see chart for details).  Clinical Course as of 04/29/21 1856  Mon Apr 29, 2021  1433 Critical lactic called from lab, 2.0. [RS]  1839 Troponin I (High Sensitivity): 17 [HK]  1839 B Natriuretic Peptide: 82.5 [HK]    Clinical Course User Index [HK] Shelly Coss, Ilai Hiller, PA-C [RS] Sponseller, Gypsy Balsam, PA-C   MDM Rules/Calculators/A&P                           [phone] F with a past medical history of hypertension, GERD, prior stroke presenting to the ED with a chief complaint of leg swelling and facial swelling.  She has had intermittent bilateral lower extremity edema for the past month.  Also noticed when she woke up this morning the left side of her face was swollen.  This is happened to her before but states that it went away without intervention.  She denies blurry vision but does have some pain with EOMs.  On exam there is bilateral lower extremity edema that is symmetrical.  Equal and intact distal pulses noted bilaterally.  There are some tenderness with palpation of the left orbit.  Pupils are equal and reactive to light.  Her troponin is negative x2.  EKG shows sinus bradycardia and prolonged QT, no changes from prior tracings.  Chest x-ray shows no acute findings.  BNP is normal.  Lactic acid is 2.  Hypokalemia 3. Will replete here. Pending CT of the orbits to r/o cellulitis. Care handed off to oncoming provider pending remainder of workup and recheck.   Portions of this note were generated with [employer]. Dictation errors may occur despite best attempts at proofreading.  Final Clinical Impression(s) / ED Diagnoses Final diagnoses:  Peripheral edema  Facial swelling    Rx / DC Orders ED Discharge Orders     None         Delia Heady, PA-C 04/29/21 [place], [place], DO 04/29/21 2221

## 2021-04-29 NOTE — Discharge Instructions (Addendum)
Return for rapid spreading redness, fever

## 2021-04-29 NOTE — ED Triage Notes (Signed)
Pt reports left sided facial swelling since this morning, airway intact, reports hx of same. Pt also reports 1 week of bilateral foot swelling and SOB/chest pain. Resp e.u

## 2021-05-16 ENCOUNTER — Telehealth: Payer: Self-pay

## 2021-05-16 NOTE — Telephone Encounter (Signed)
Spoke with patient and scheduled an in-person Palliative Consult for 06/12/21 @ [facility] screening was negative. No pets in home. Patient lives with husband and daughter.  Consent obtained; updated Outlook/Netsmart/Team List and Epic.   Patient is aware she may be receiving a call from provider the day before or day of to confirm appointment.

## 2021-05-31 IMAGING — MR MR CERVICAL SPINE W/O CM
4 of 5 series · 18 of 48 positions shown · non-contrast
Comparison: Brain MRI and head CT today.

CLINICAL DATA: 58-year-old female with neurologic deficit, unsteady
gait.

EXAM:
MRI CERVICAL SPINE WITHOUT CONTRAST
TECHNIQUE: Multiplanar, multisequence MR imaging of the cervical spine was
performed. No intravenous contrast was administered.

[Series 2: T2 · sagittal · 3.0mm · 0.43mm/px · 8 of 15 slices shown (1 of 2)]
[im 1/15]
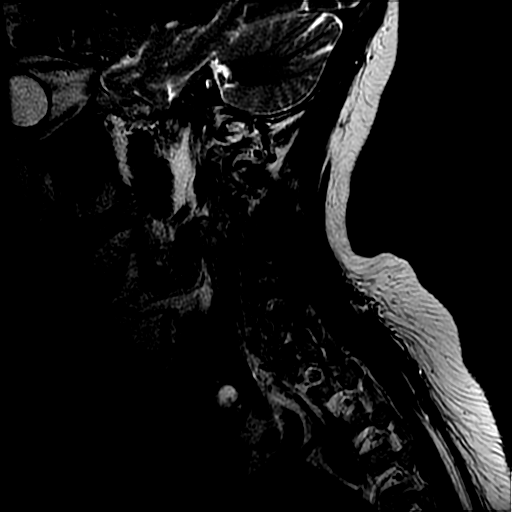
[im 3/15]
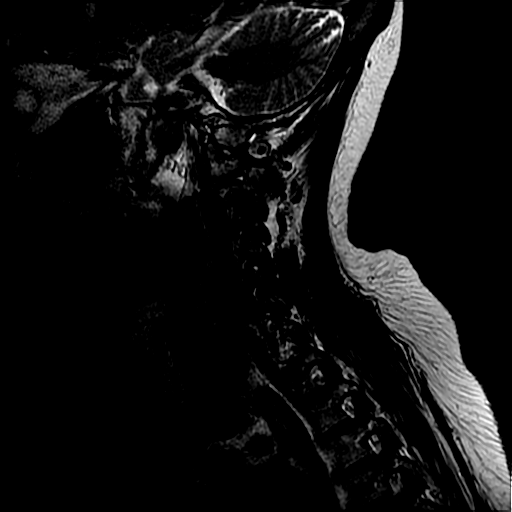
[im 5/15]
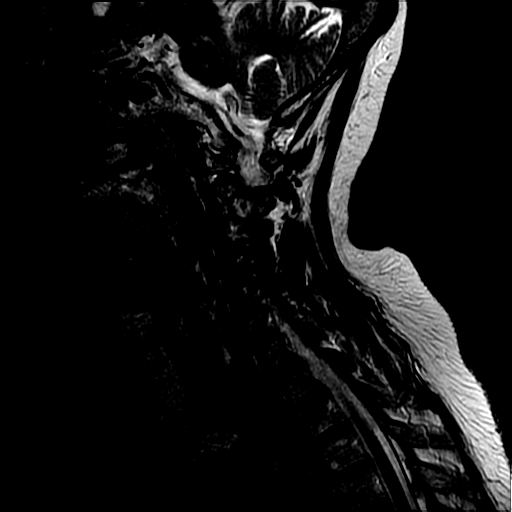
[im 7/15]
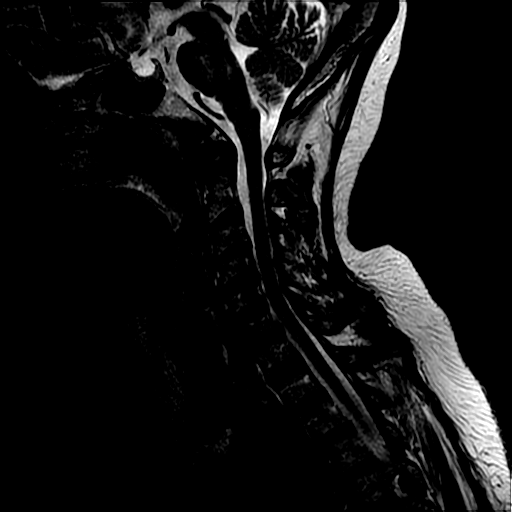
[im 9/15]
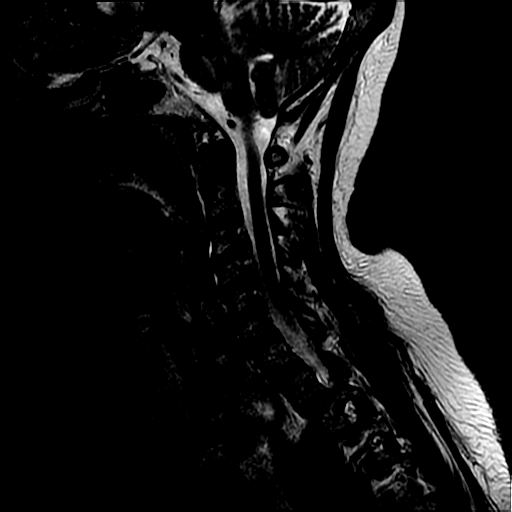
[im 11/15]
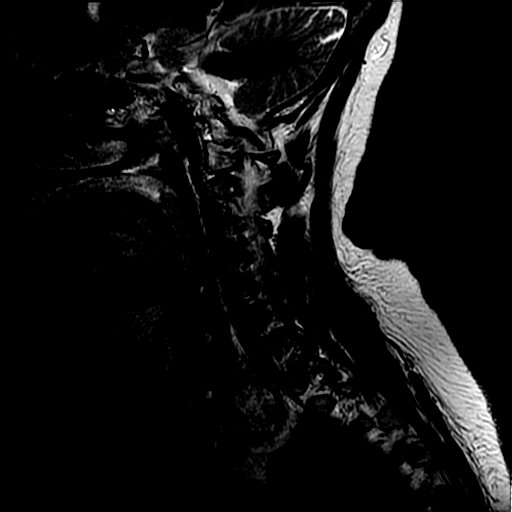
[im 13/15]
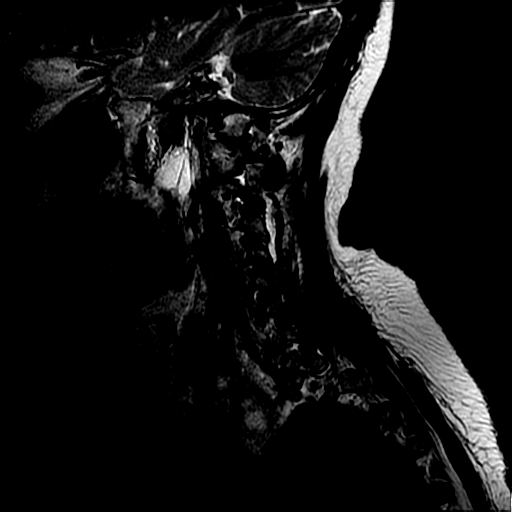
[im 15/15]
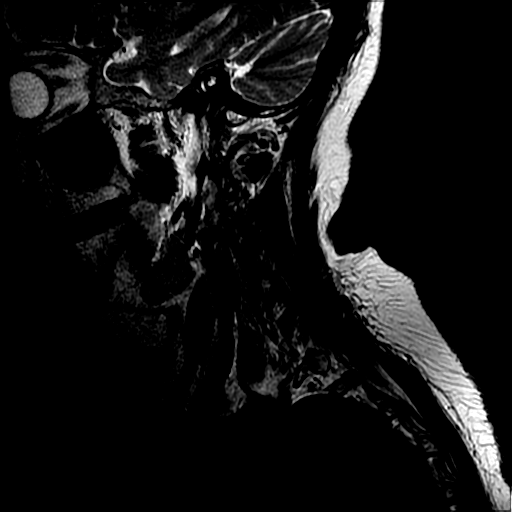

[Series 4: sag ir · sagittal · 3.0mm · 0.43mm/px · 3 of 15 slices shown]
[im 3/15]
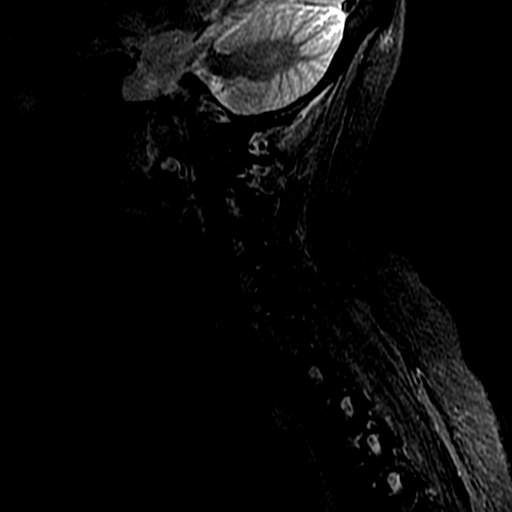
[im 8/15]
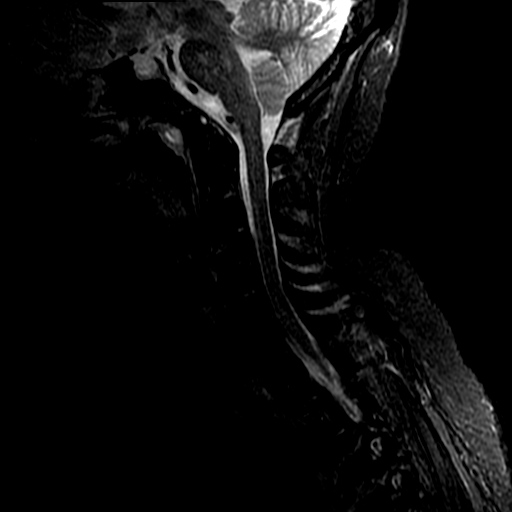
[im 12/15]
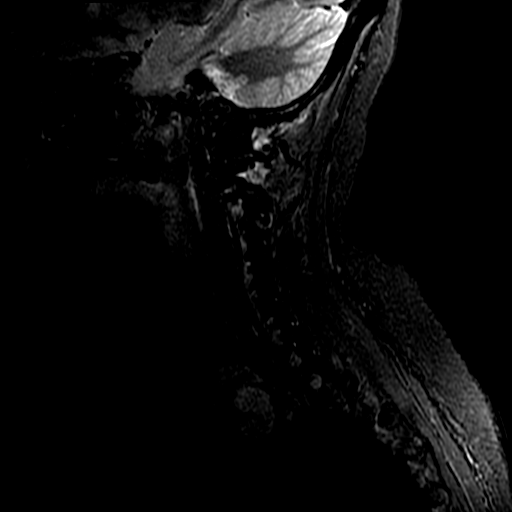

[Series 5: T2 · axial · 3.0mm · 0.39mm/px · z∈[-86,-16]mm · 4 of 27 slices shown (2 of 2)]
[im 1/27]
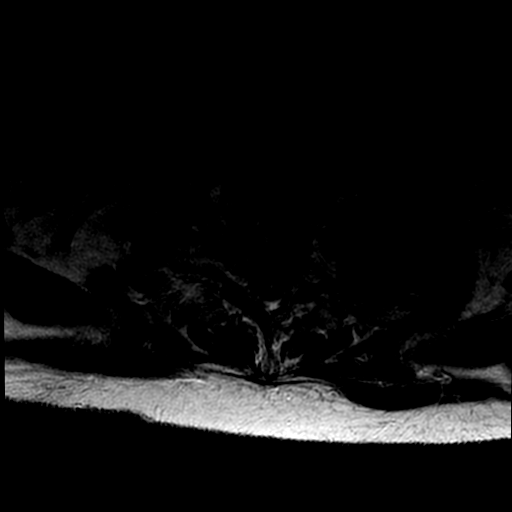
[im 5/27]
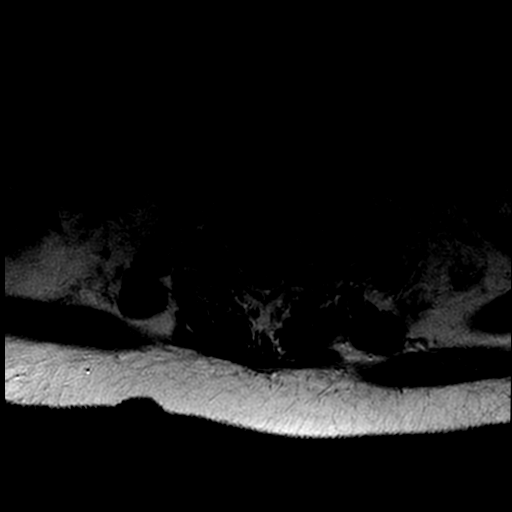
[im 14/27]
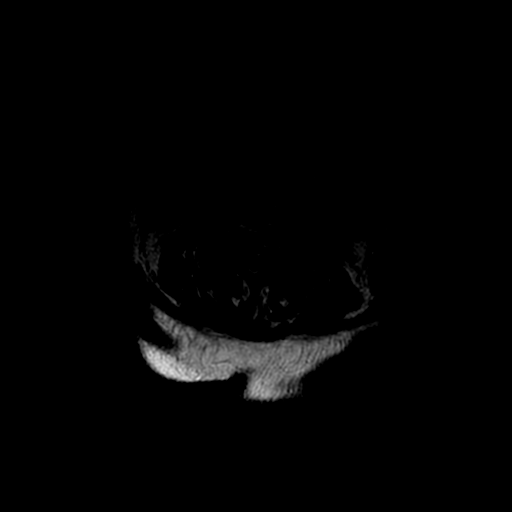
[im 22/27]
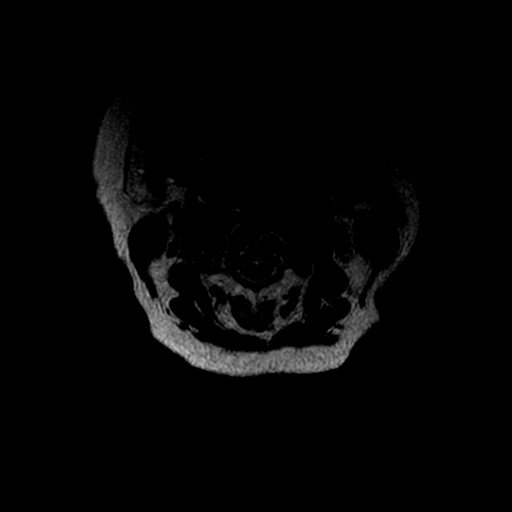

[Series 7: T1 · sagittal · 3.0mm · 0.43mm/px · 3 of 15 slices shown]
[im 3/15]
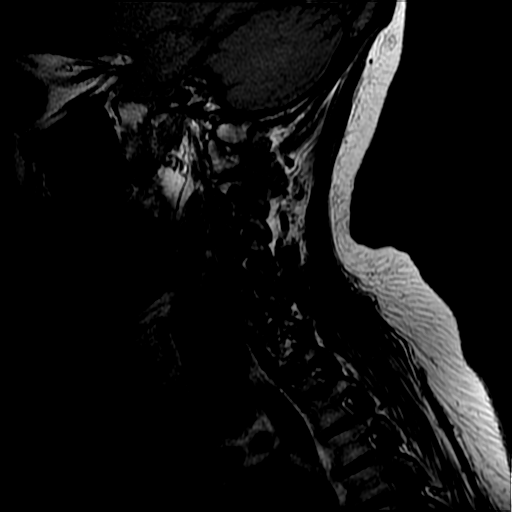
[im 8/15]
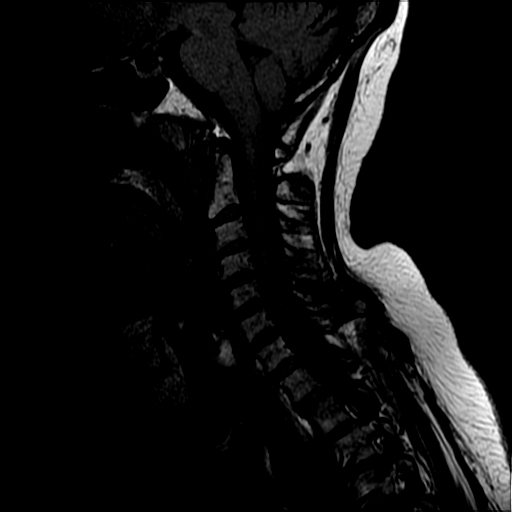
[im 12/15]
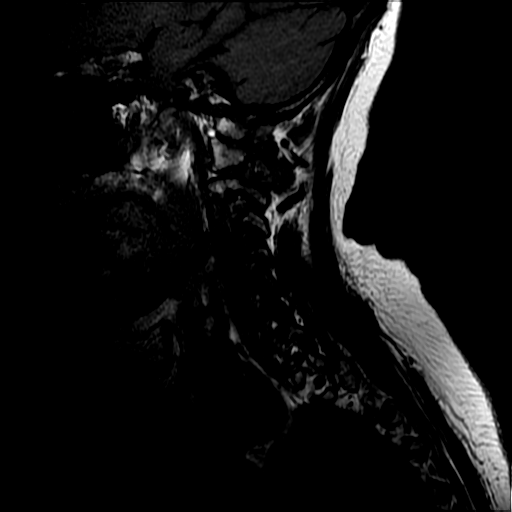

[18 of 48 positions shown; findings below may reference images not displayed]

Cervical spine CT
05/03/2020.

Ultrasound-guided needle biopsy of right thyroid nodule 06/27/2014.
FINDINGS: Alignment: Preserved cervical lordosis, improved from the CT last
month.

Vertebrae: No marrow edema or evidence of acute osseous abnormality.
Visualized bone marrow signal is within normal limits.

Cord: Cervical and visible upper thoracic spinal cord appear normal.

Posterior Fossa, vertebral arteries, paraspinal tissues:
Cervicomedullary junction is within normal limits. Brain details
reported separately today. Preserved major vascular flow voids in
the neck.

Multinodular thyroid goiter, dominant 2.4 cm right thyroid nodule,
reportedly up to 3.2 cm by ultrasound in 6093 and biopsy that year.
No follow-up indicated (ref: [HOSPITAL]. [DATE]):

Otherwise negative visible neck soft tissues, lung apices.

Disc levels:

C2-C3:  Negative.

C3-C4: Mild disc bulge and endplate spurring. Mild facet
hypertrophy. No spinal stenosis. Mild C4 foraminal stenosis greater
on the right.

C4-C5: Circumferential disc bulge. Mild endplate spurring. Mild
spinal stenosis. No definite cord mass effect. Moderate left and
mild right C5 foraminal stenosis.

C5-C6: Mild circumferential disc bulge and endplate spurring. Mild
facet and ligament flavum hypertrophy. Borderline to mild spinal
stenosis. No cord mass effect. Mild to moderate bilateral C6
foraminal stenosis.

C6-C7: Mild circumferential disc bulge. Small superimposed central
disc protrusion (series 6, image 20). Borderline to mild spinal
stenosis. No cord mass effect. No foraminal stenosis.

C7-T1:  Mild facet hypertrophy.  No stenosis.

No visible upper thoracic spinal stenosis.
IMPRESSION: 1. Ordinary degenerative changes in the cervical spine with up to
mild spinal stenosis C4-C5 through C6-C7. No significant cord mass
effect and no cord signal abnormality identified.

2. Up to moderate degenerative neural foraminal stenosis at the left
C5 and bilateral C6 nerve levels.

3. Chronic thyroid goiter with previously biopsied dominant nodule

## 2021-05-31 IMAGING — MR MR HEAD W/O CM
6 of 10 series · 28 of 48 positions shown · non-contrast
Comparison: 06/29/2020 head CT and prior. 03/04/2017 MRI head and
prior.

CLINICAL DATA: Stroke, follow-up.

EXAM:
MRI HEAD WITHOUT CONTRAST
TECHNIQUE: Multiplanar, multiecho pulse sequences of the brain and surrounding
structures were obtained without intravenous contrast.

[Series 2: DWI · axial · 3.0mm · 0.94mm/px · z∈[-86,+51]mm · 8 of 100 slices shown (1 of 2)]
[im 1/100]
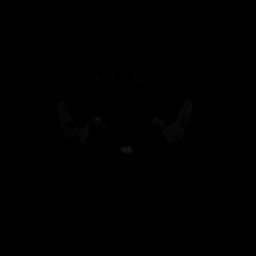
[im 12/100]
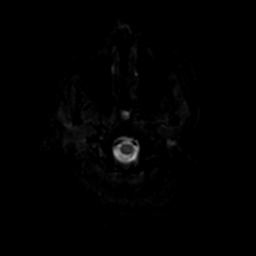
[im 34/100]
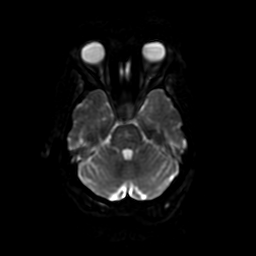
[im 45/100]
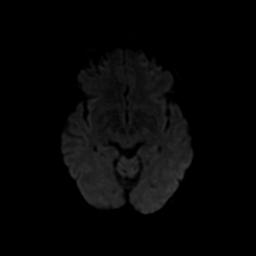
[im 56/100]
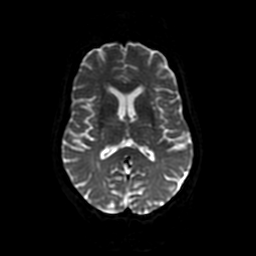
[im 67/100]
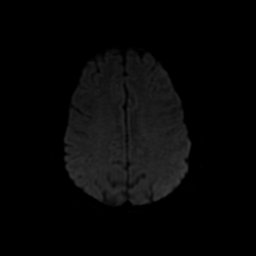
[im 89/100]
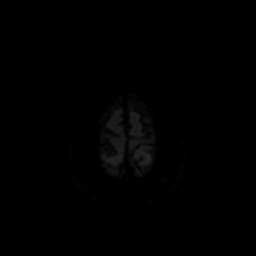
[im 100/100]
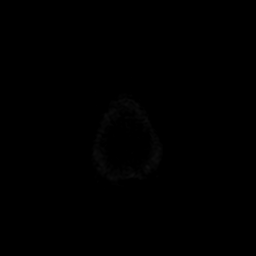

[Series 3: DWI · coronal · 4.0mm · 0.94mm/px · 7 of 74 slices shown (2 of 2)]
[im 1/74]
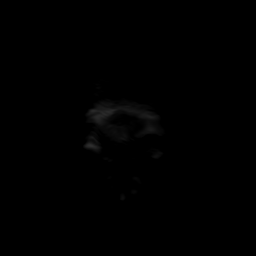
[im 13/74]
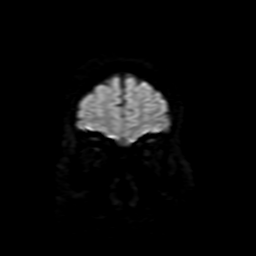
[im 25/74]
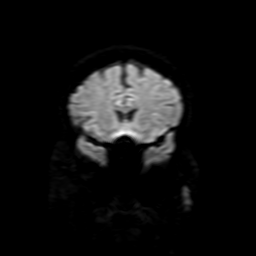
[im 37/74]
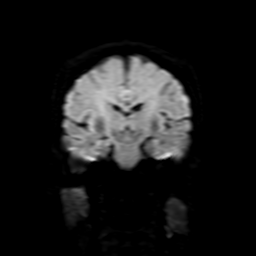
[im 49/74]
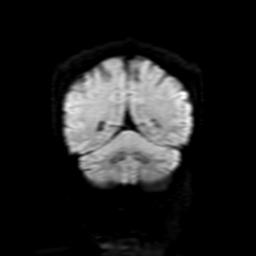
[im 61/74]
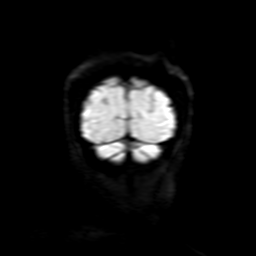
[im 74/74]
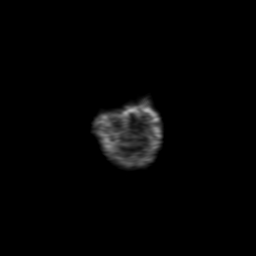

[Series 4: FLAIR · sagittal · 5.0mm · 0.23mm/px · 2 of 23 slices shown (1 of 2)]
[im 1/23]
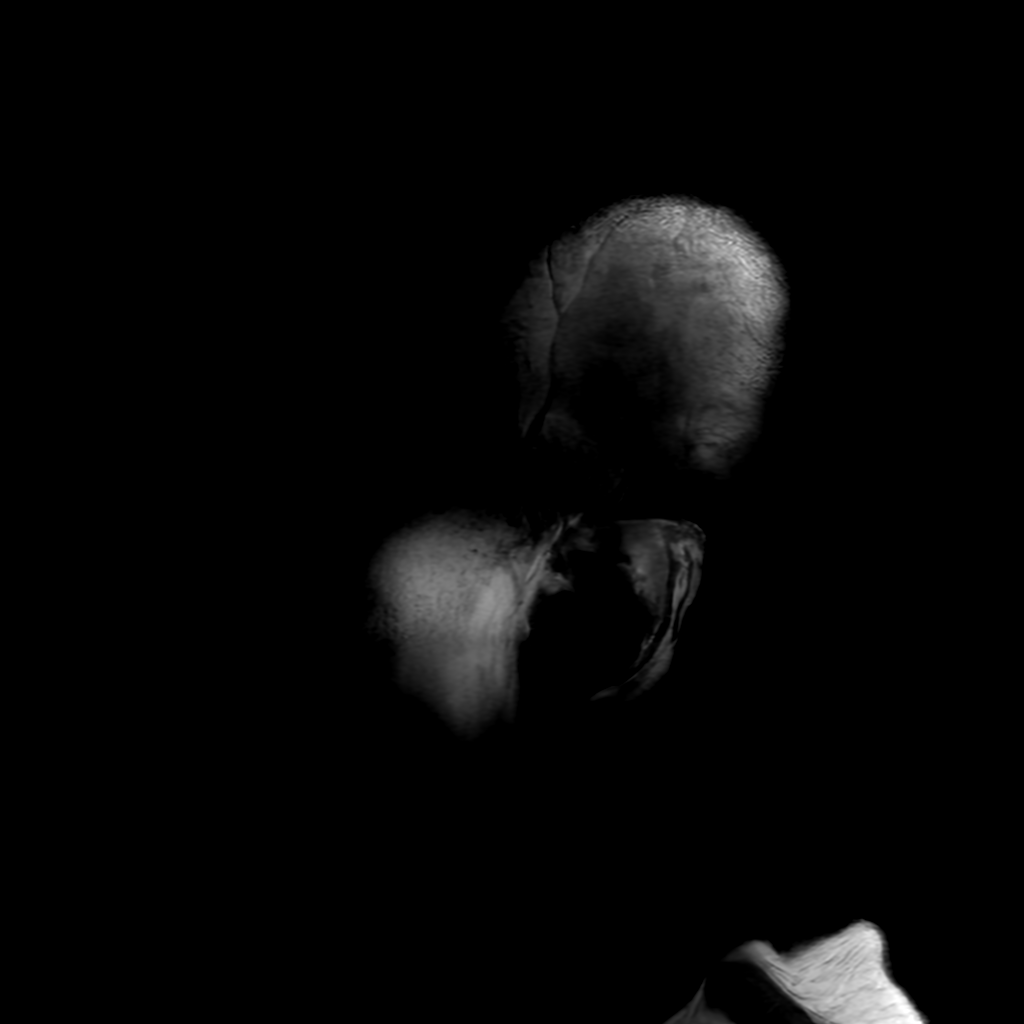
[im 23/23]
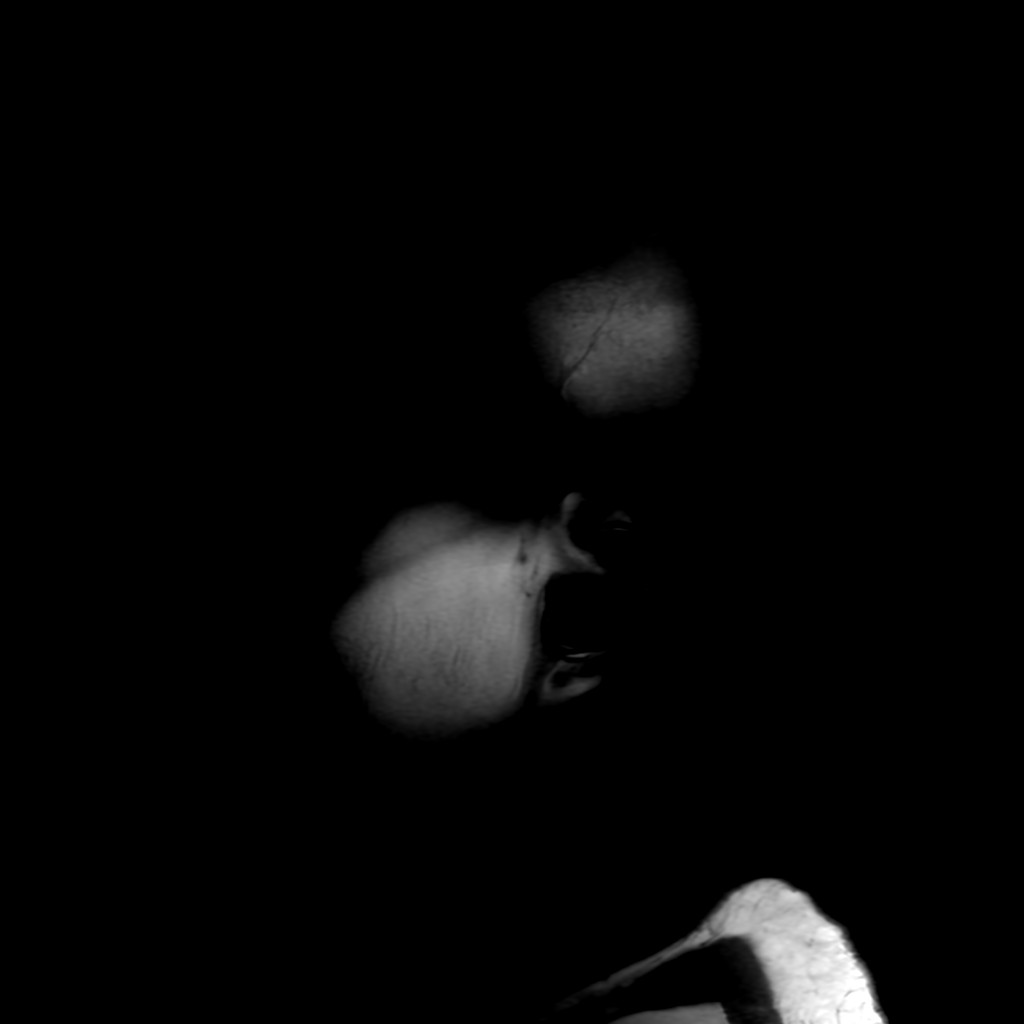

[Series 6: FLAIR · axial · 3.0mm · 0.41mm/px · z∈[-74,+47]mm · 2 of 23 slices shown (2 of 2)]
[im 1/23]
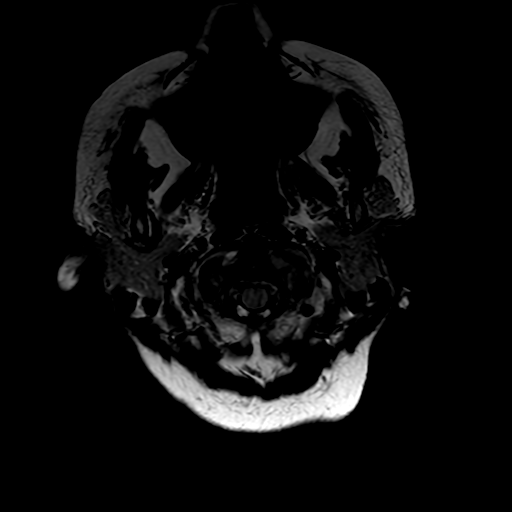
[im 23/23]
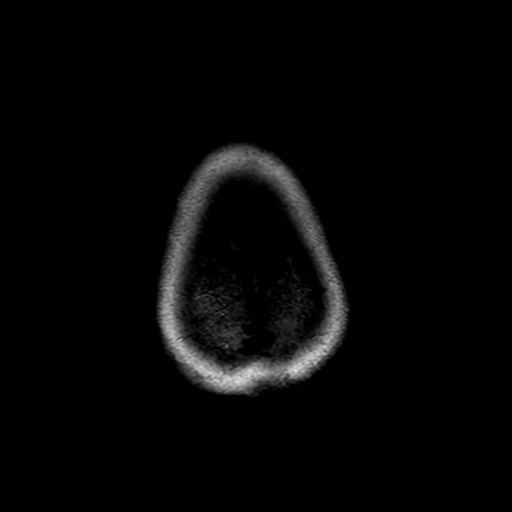

[Series 250: ADC · axial · 3.0mm · 0.94mm/px · z∈[-86,+51]mm · 5 of 50 slices shown (1 of 2)]
[im 1/50]
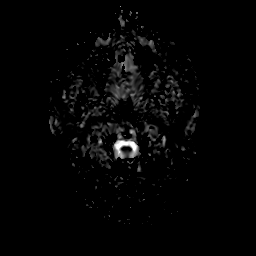
[im 13/50]
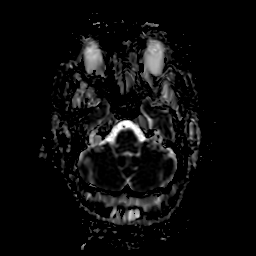
[im 25/50]
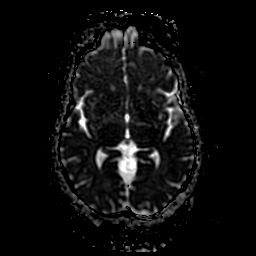
[im 37/50]
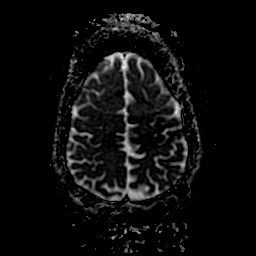
[im 50/50]
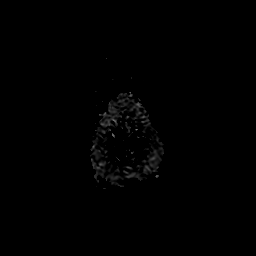

[Series 350: ADC · coronal · 4.0mm · 0.94mm/px · 4 of 37 slices shown (2 of 2)]
[im 1/37]
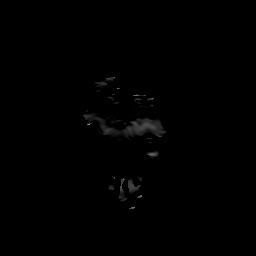
[im 13/37]
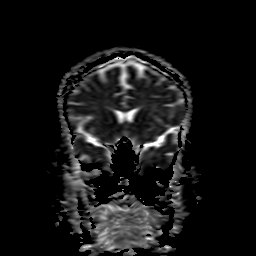
[im 25/37]
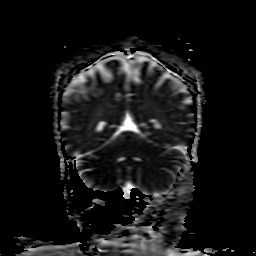
[im 37/37]
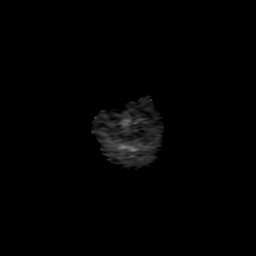

[28 of 48 positions shown; findings below may reference images not displayed]

FINDINGS: Brain: Cerebral volume is within normal limits. T2/FLAIR
hyperintense foci involving the supratentorial white matter and pons
are mildly progressed since prior exam. No acute infarct or
intracranial hemorrhage. No midline shift, ventriculomegaly or
extra-axial fluid collection. No mass lesion.

Vascular: Normal flow voids.

Skull and upper cervical spine: Normal marrow signal.

Sinuses/Orbits: Sequela of bilateral lens replacement. Clear
paranasal sinuses. No mastoid effusion.

Other: None.
IMPRESSION: No acute intracranial process.

Mild chronic microvascular ischemic changes, progressed since prior
exam.

## 2021-05-31 IMAGING — CT CT HEAD W/O CM
4 series · 17 of 47 positions shown, 19 images · non-contrast
Comparison: 05/03/2020

CLINICAL DATA: Neurological deficit.  Unsteady gait.

EXAM:
CT HEAD WITHOUT CONTRAST
TECHNIQUE: Contiguous axial images were obtained from the base of the skull
through the vertex without intravenous contrast.

[Series 3: head without · axial · non-contrast · 0.39mm/px · z∈[-145,-25]mm · 7 of 32 slices shown, 9 images]
[im 4/32  brain]
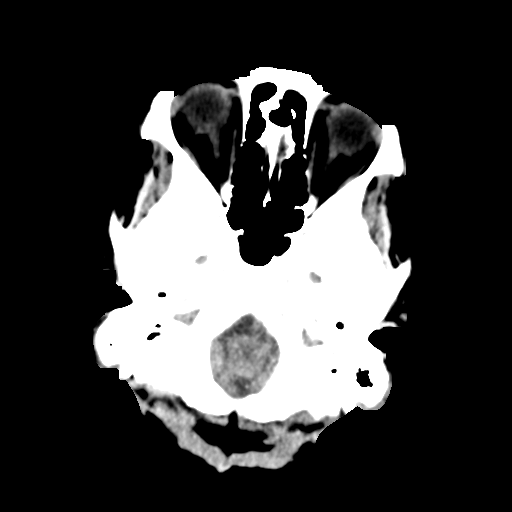
[im 4/32  bone]
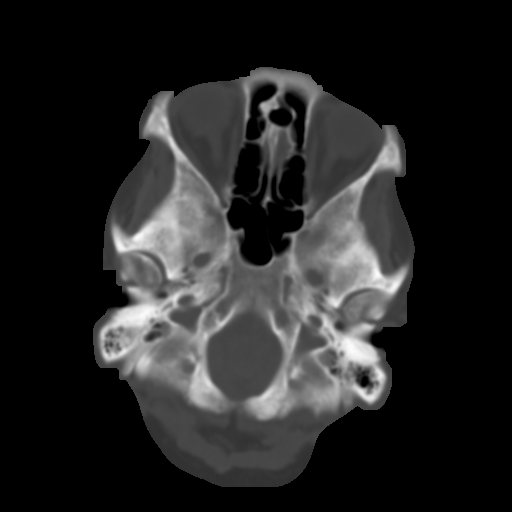
[im 8/32  brain]
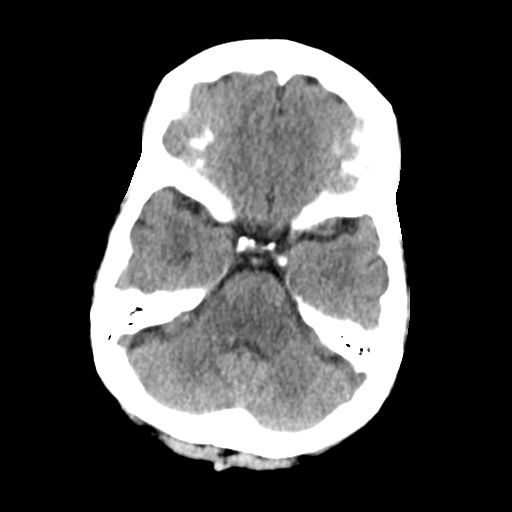
[im 12/32  brain]
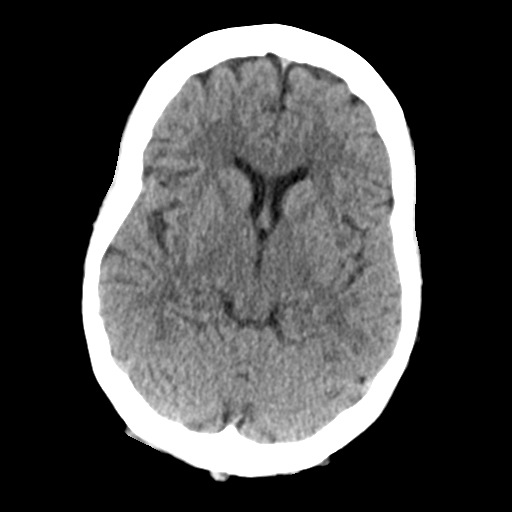
[im 16/32  brain]
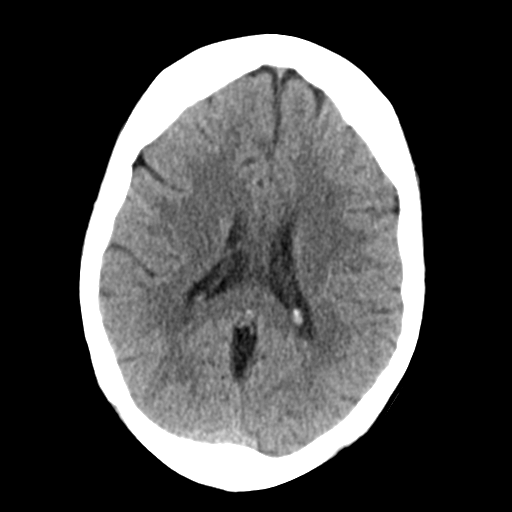
[im 20/32  brain]
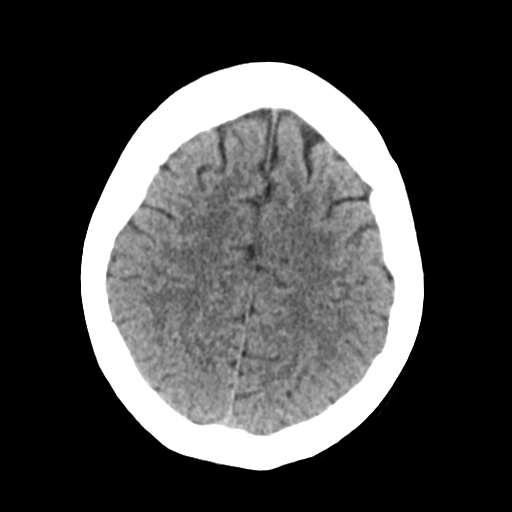
[im 20/32  bone]
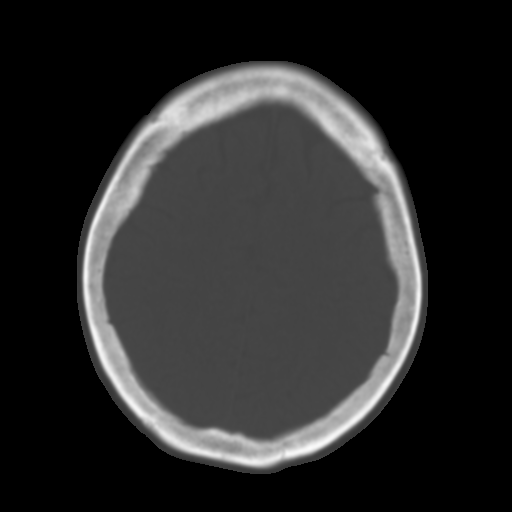
[im 24/32  brain]
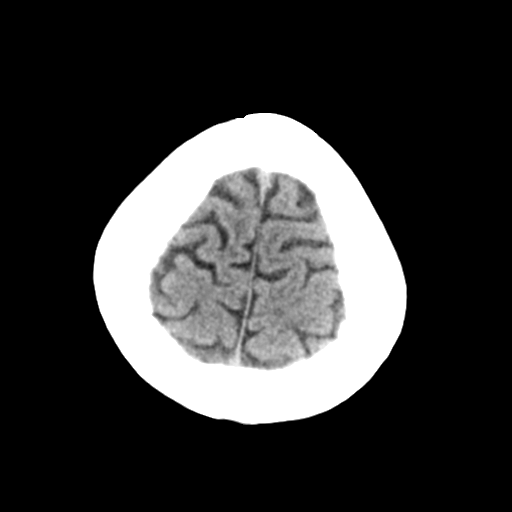
[im 28/32  brain]
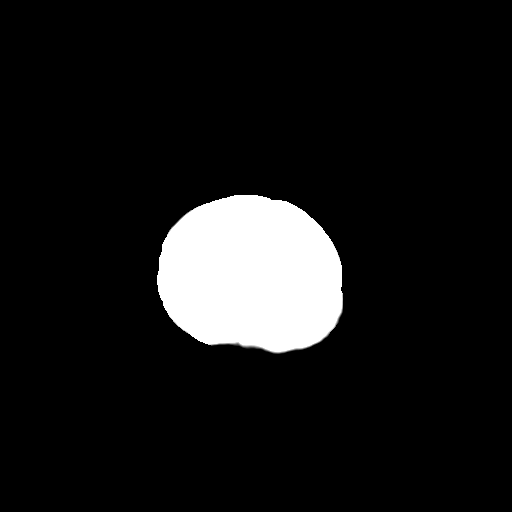

[Series 4: head bone · axial · 0.39mm/px · z∈[-146,-90]mm · 4 of 80 slices shown]
[im 8/80  bone]
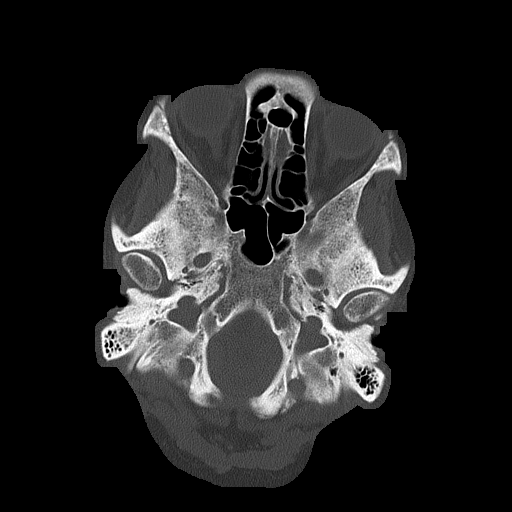
[im 16/80  bone]
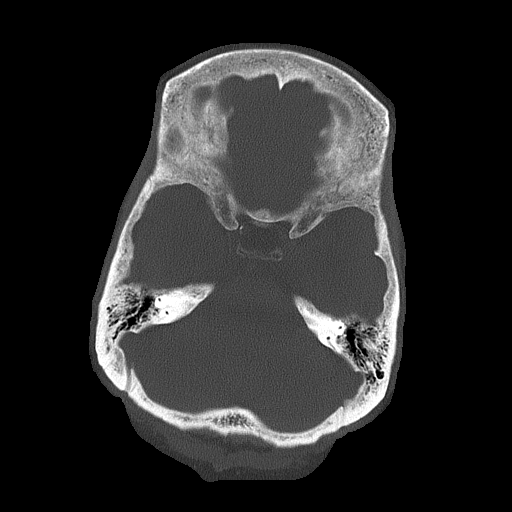
[im 24/80  bone]
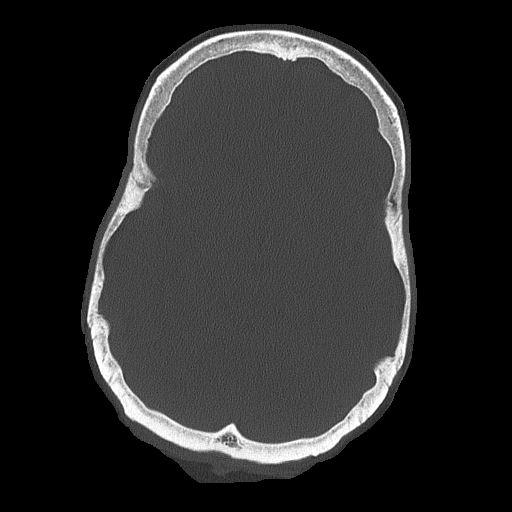
[im 36/80  bone]
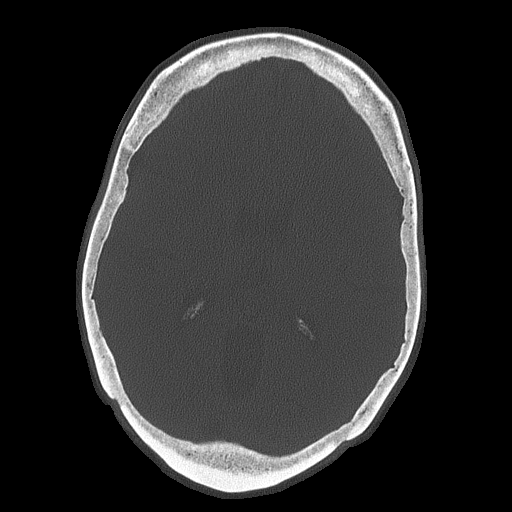

[Series 5: head without cor · coronal · non-contrast · 0.31mm/px · 3 of 62 slices shown]
[im 21/62  brain]
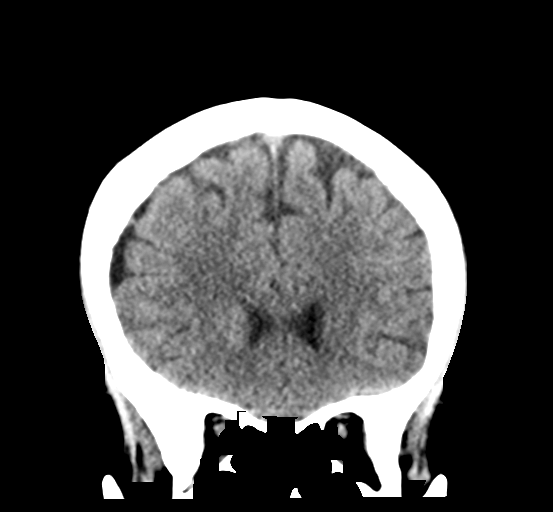
[im 28/62  brain]
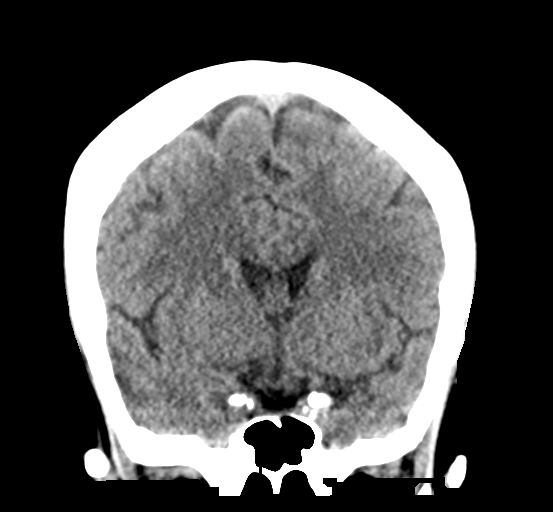
[im 34/62  brain]
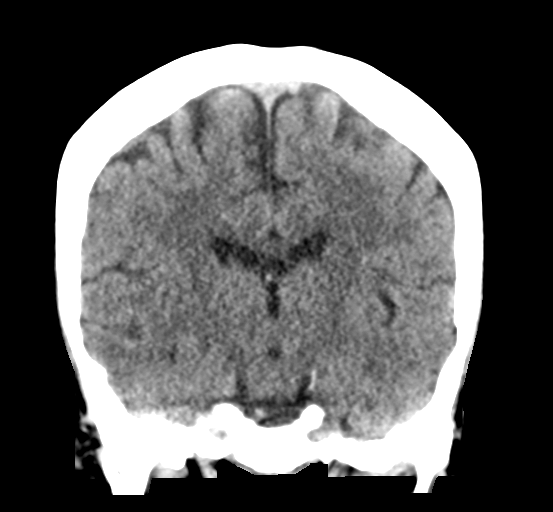

[Series 6: head without sag · sagittal · non-contrast · 0.31mm/px · 3 of 48 slices shown]
[im 16/48  brain]
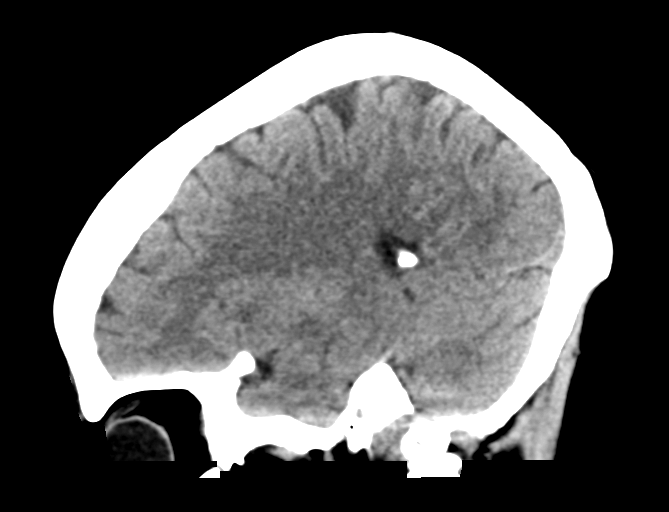
[im 24/48  brain]
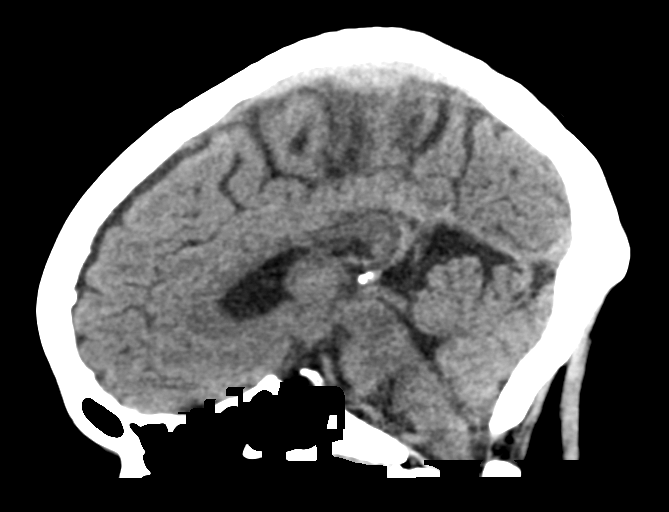
[im 32/48  brain]
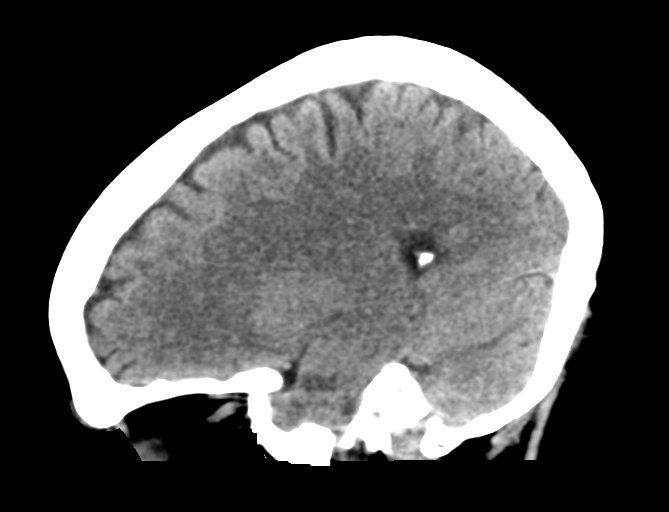

[17 of 47 positions shown; findings below may reference images not displayed]

FINDINGS: Brain: No evidence of acute infarction, hemorrhage, hydrocephalus,
extra-axial collection or mass lesion/mass effect.

Vascular: No hyperdense vessel or unexpected calcification.

Skull: No osseous abnormality.

Sinuses/Orbits: Visualized paranasal sinuses are clear. Visualized
mastoid sinuses are clear. Visualized orbits demonstrate no focal
abnormality.

Other: None
IMPRESSION: No acute intracranial pathology.

## 2021-06-01 IMAGING — MR MR LUMBAR SPINE W/O CM
4 of 5 series · 29 of 48 positions shown · non-contrast
Comparison: MRI lumbar spine 04/27/2019.

CLINICAL DATA: Altered mental status. Left-sided weakness. Question
cord compression.

EXAM:
MRI LUMBAR SPINE WITHOUT CONTRAST
TECHNIQUE: Multiplanar, multisequence MR imaging of the lumbar spine was
performed. No intravenous contrast was administered.

[Series 1: T2 · sagittal · 4.0mm · 0.68mm/px · 8 of 15 slices shown (1 of 2)]
[im 1/15]
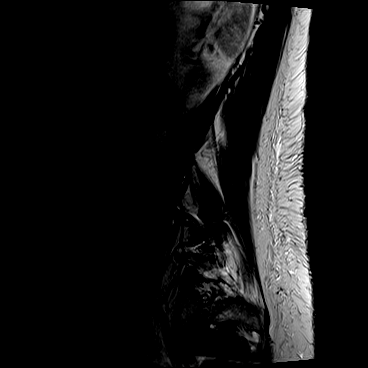
[im 3/15]
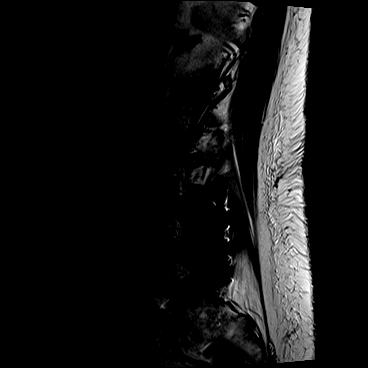
[im 5/15]
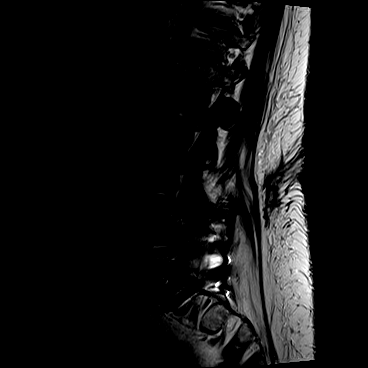
[im 7/15]
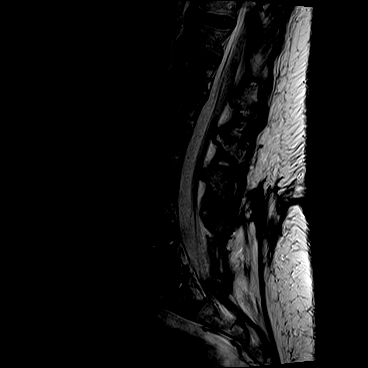
[im 9/15]
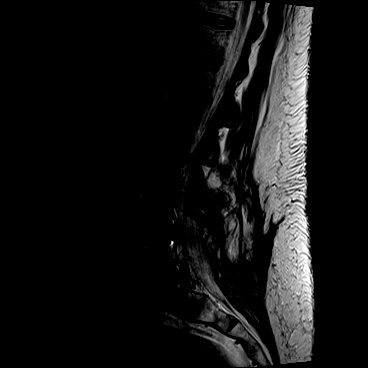
[im 11/15]
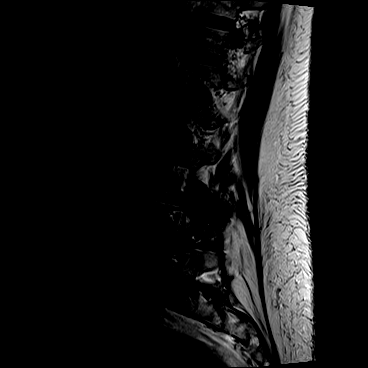
[im 13/15]
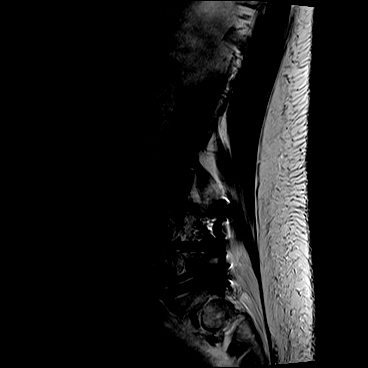
[im 15/15]
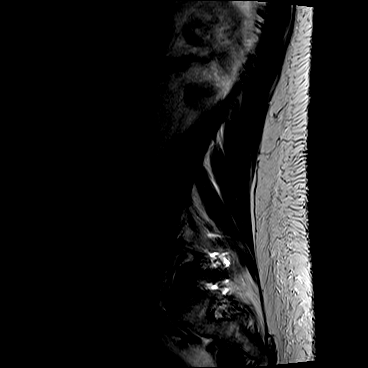

[Series 3: T1 · sagittal · 4.0mm · 0.86mm/px · 8 of 15 slices shown (1 of 2)]
[im 1/15]
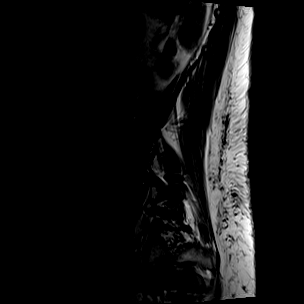
[im 3/15]
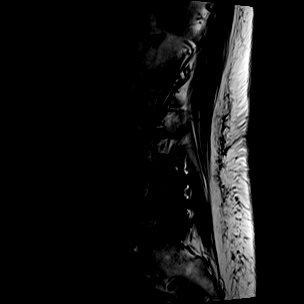
[im 5/15]
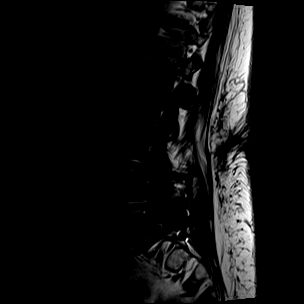
[im 7/15]
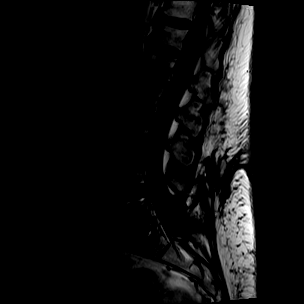
[im 9/15]
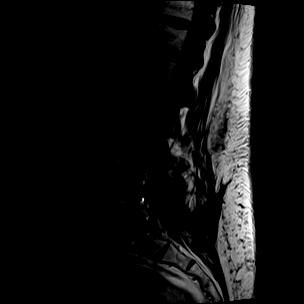
[im 11/15]
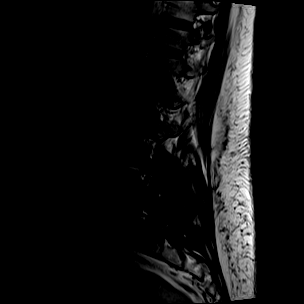
[im 13/15]
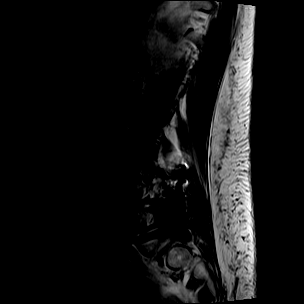
[im 15/15]
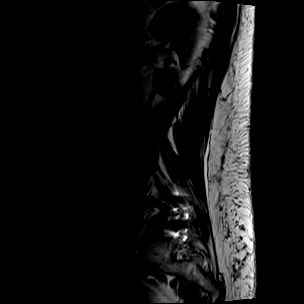

[Series 4: T2 · axial · 5.0mm · 0.57mm/px · z∈[-477,-297]mm · 9 of 22 slices shown (2 of 2)]
[im 1/22]
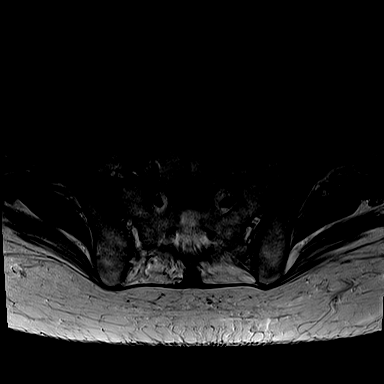
[im 4/22]
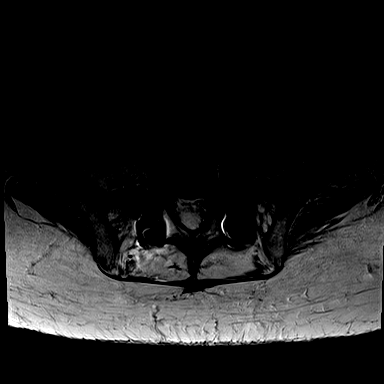
[im 6/22]
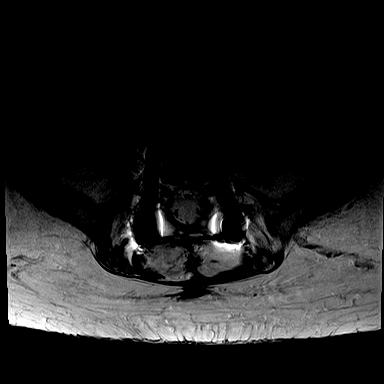
[im 10/22]
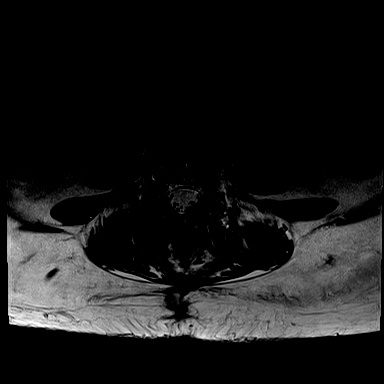
[im 12/22]
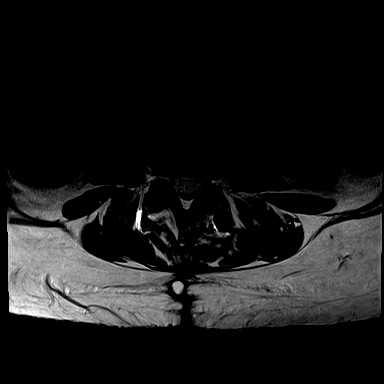
[im 16/22]
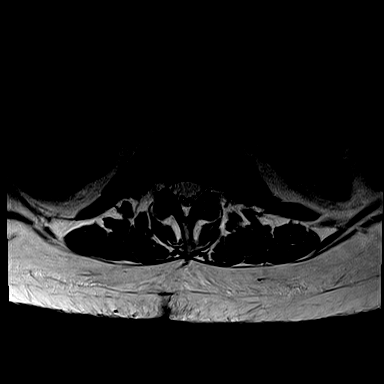
[im 18/22]
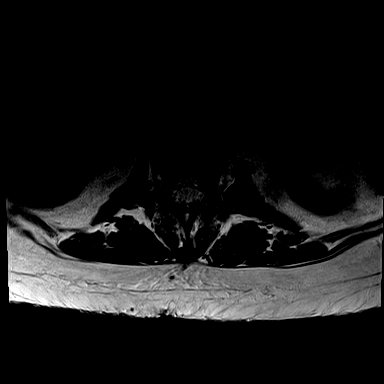
[im 20/22]
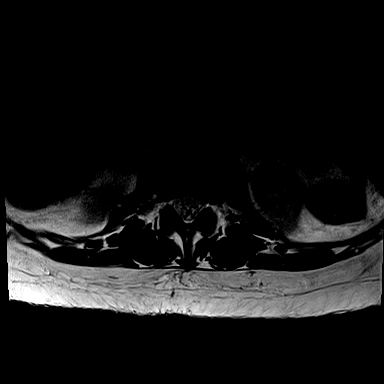
[im 22/22]
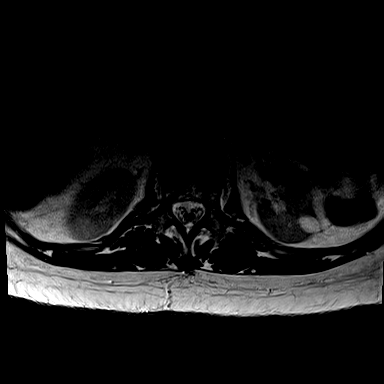

[Series 5: T1 · axial · 5.0mm · 0.34mm/px · z∈[-477,-312]mm · 4 of 22 slices shown (2 of 2)]
[im 1/22]
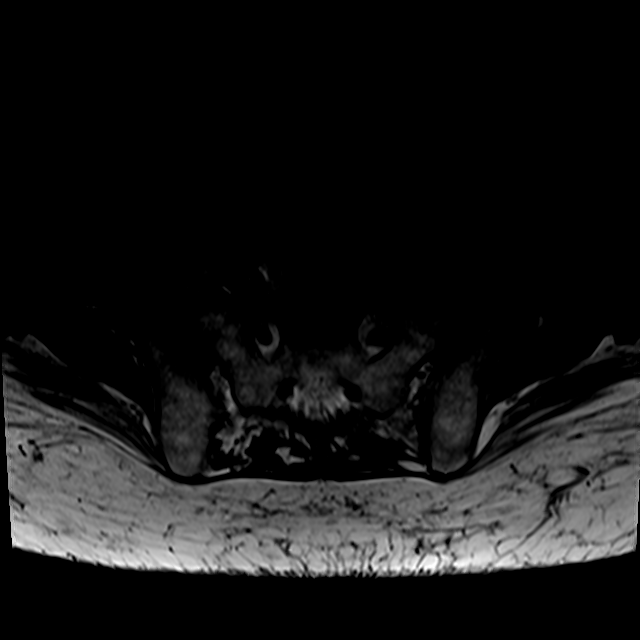
[im 4/22]
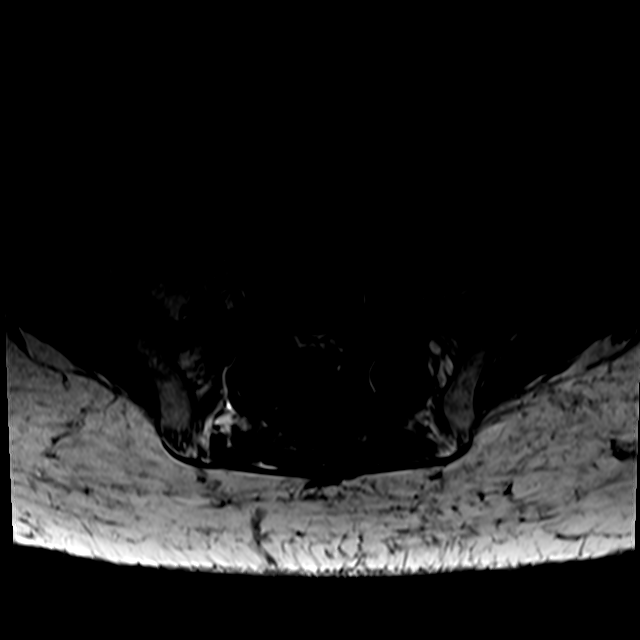
[im 12/22]
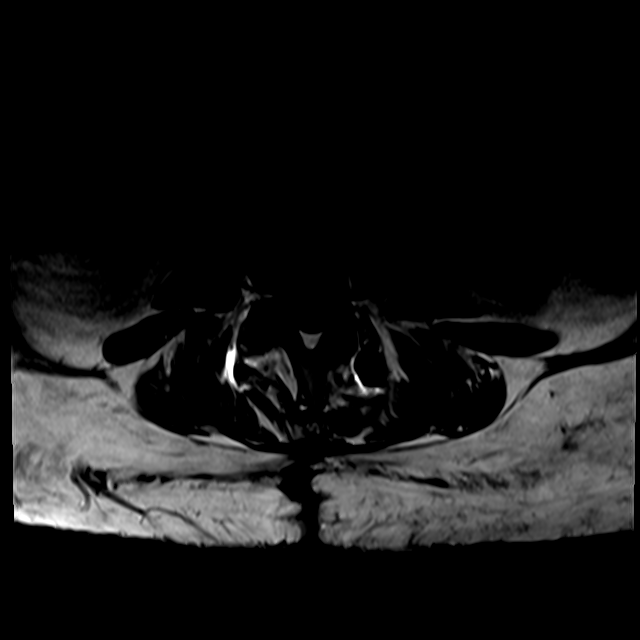
[im 20/22]
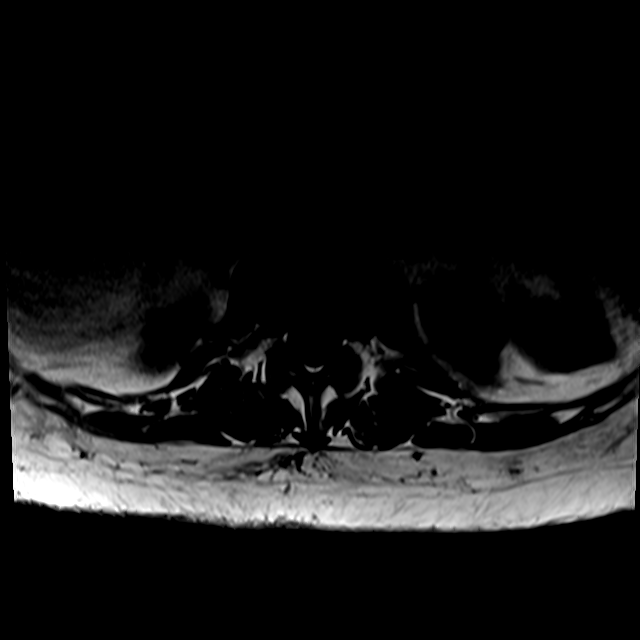

[29 of 48 positions shown; findings below may reference images not displayed]

FINDINGS: Segmentation:  Standard.

Alignment:  Normal.

Vertebrae:  No fracture, evidence of discitis, or bone lesion.

Conus medullaris and cauda equina: Conus extends to the L1-2 level.
Conus and cauda equina appear normal.

Paraspinal and other soft tissues: Small left renal cyst
incidentally noted.

Disc levels:

L1-2: Negative.

L2-3: Negative.

L3-4: Mild facet degenerative change.  Otherwise negative.

L4-5: Status post PLIF. Previously seen postoperative seromas have
almost completely resolved. The central canal and foramina are
widely patent.

L5-S1: Status post PLIF. The central canal and foramina are widely
patent.
IMPRESSION: No acute abnormality or finding to explain the patient's symptoms.
The central canal and foramina are widely patent at all levels.

Status post L4-S1 laminectomy and fusion without evidence of
complication.

## 2021-06-01 IMAGING — MR MR THORACIC SPINE W/O CM
4 of 5 series · 22 of 48 positions shown · non-contrast
Comparison: CT of the thoracic spine 05/03/2020.

CLINICAL DATA: Altered mental status. Left-sided weakness. Question
cord compression.

EXAM:
MRI THORACIC SPINE WITHOUT CONTRAST
TECHNIQUE: Multiplanar, multisequence MR imaging of the thoracic spine was
performed. No intravenous contrast was administered.

[Series 19: T2 · sagittal · 3.0mm · 0.65mm/px · 6 of 17 slices shown (1 of 2)]
[im 1/17]
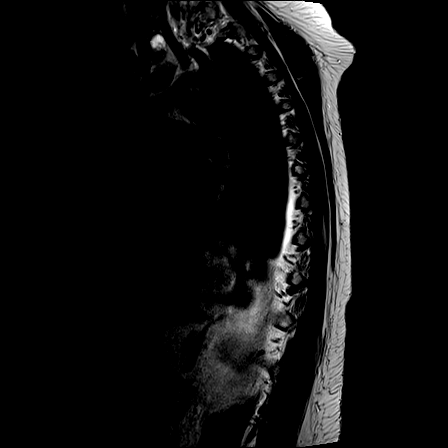
[im 4/17]
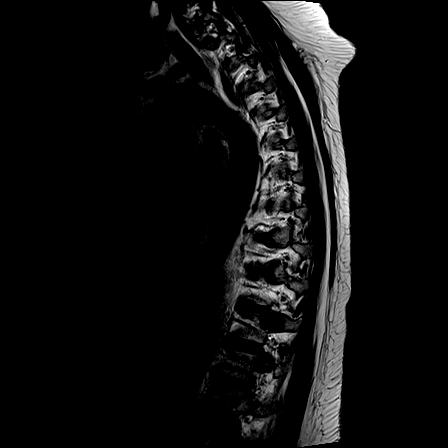
[im 7/17]
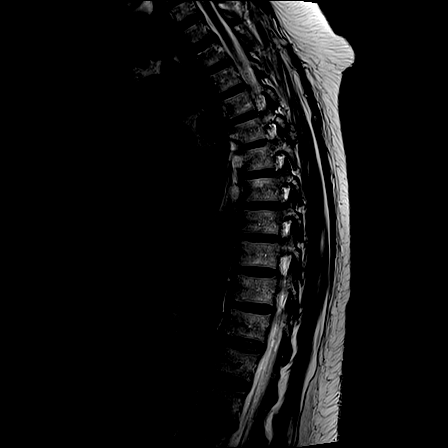
[im 10/17]
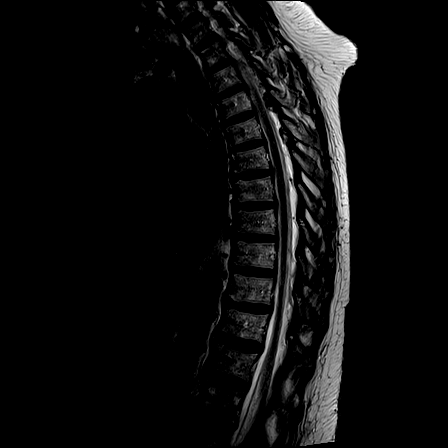
[im 13/17]
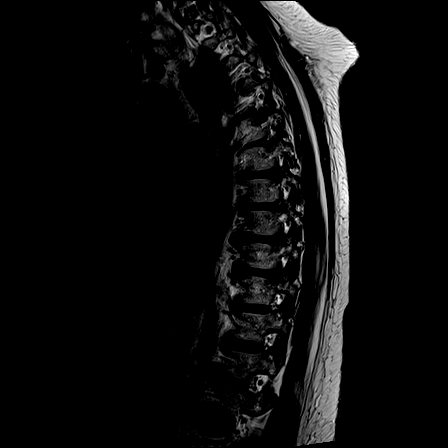
[im 17/17]
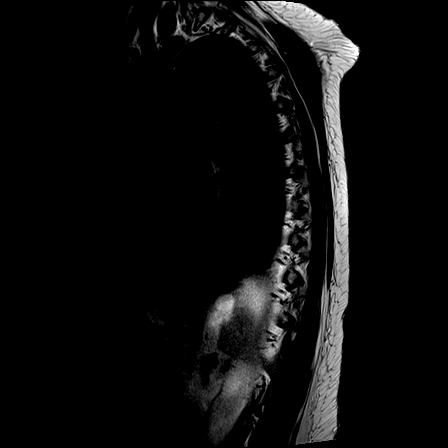

[Series 20: T1 · sagittal · 3.0mm · 0.65mm/px · 5 of 17 slices shown]
[im 1/17]
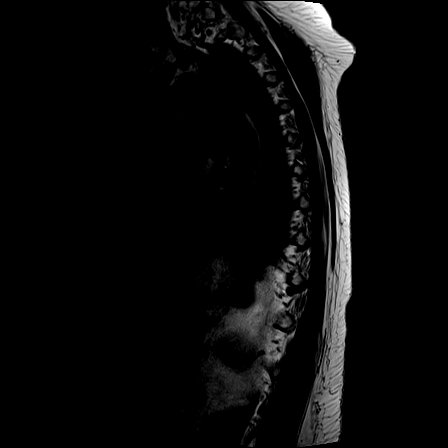
[im 3/17]
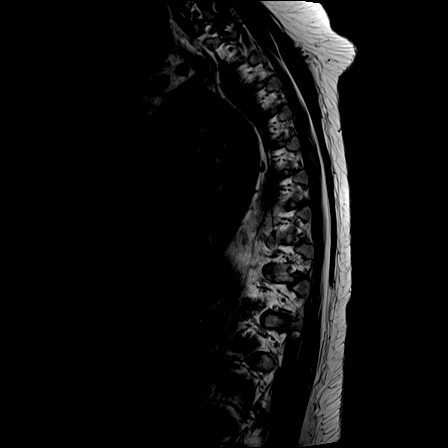
[im 6/17]
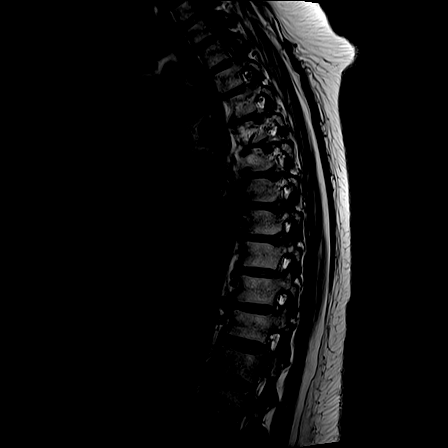
[im 9/17]
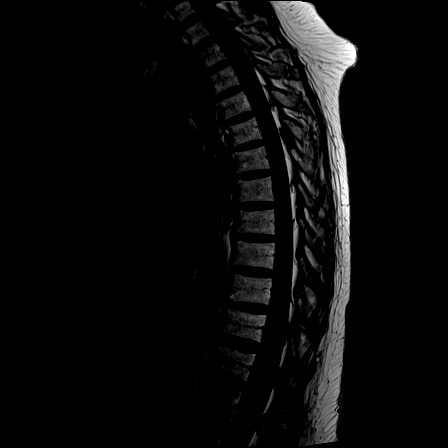
[im 14/17]
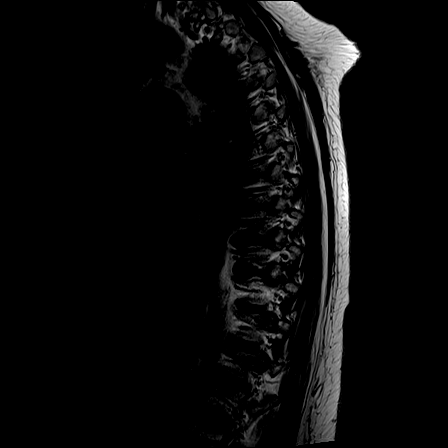

[Series 21: STIR · sagittal · 3.0mm · 0.32mm/px · 3 of 17 slices shown]
[im 3/17]
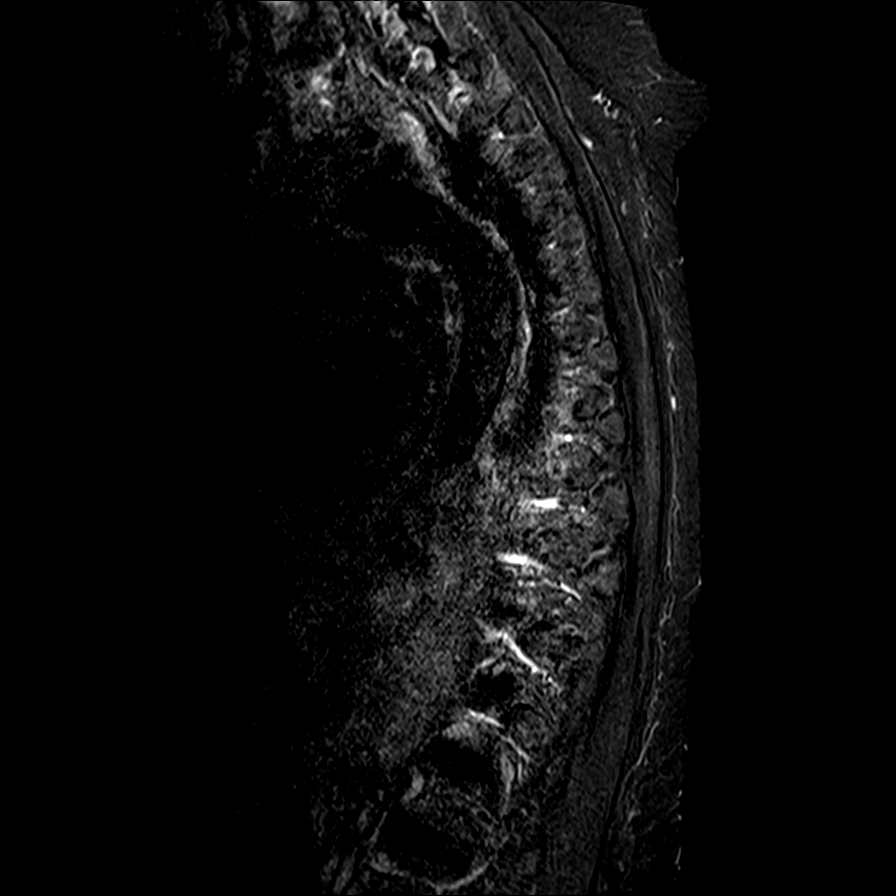
[im 9/17]
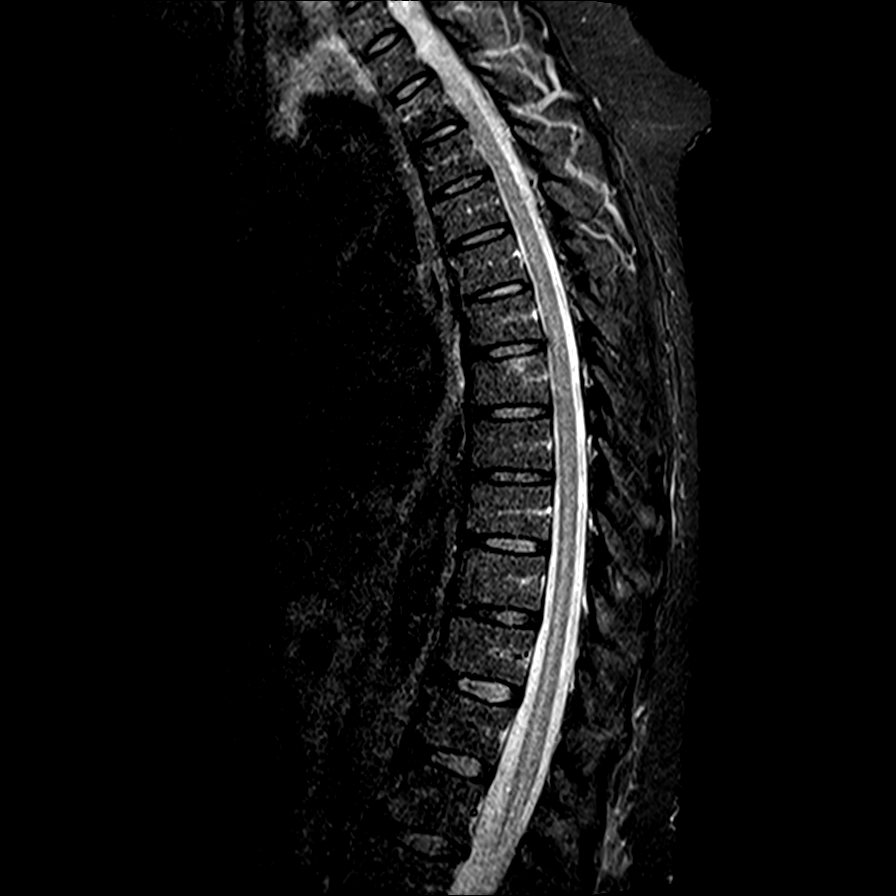
[im 14/17]
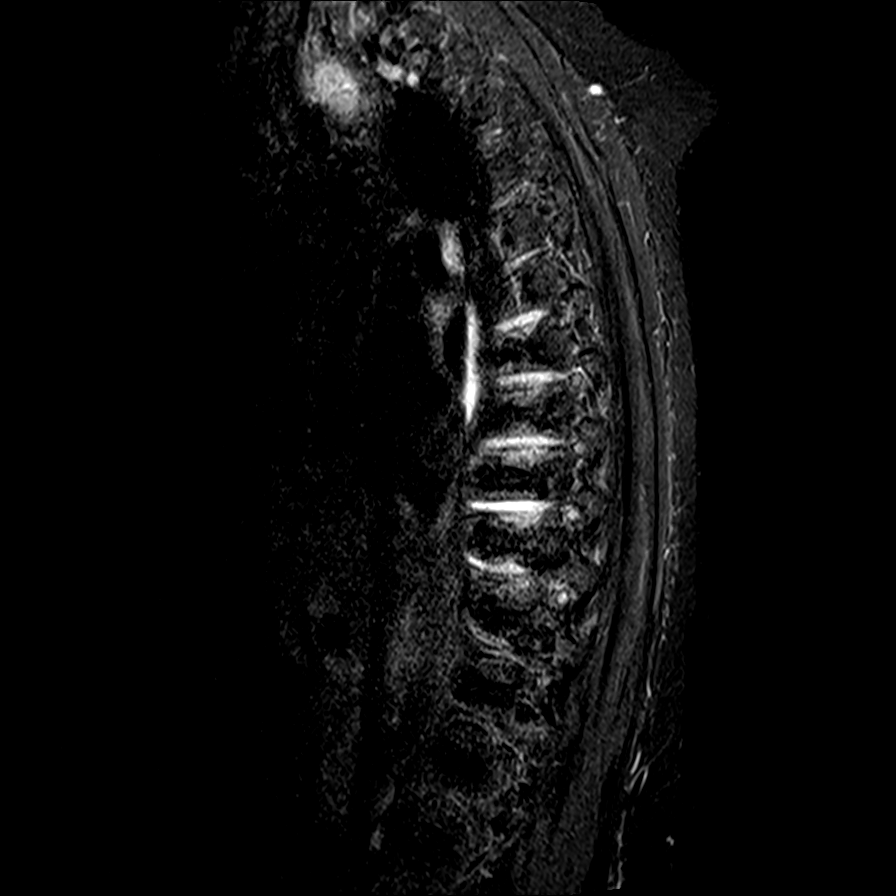

[Series 22: T2 · axial · 5.0mm · 0.59mm/px · z∈[-274,-103]mm · 8 of 36 slices shown (2 of 2)]
[im 1/36]
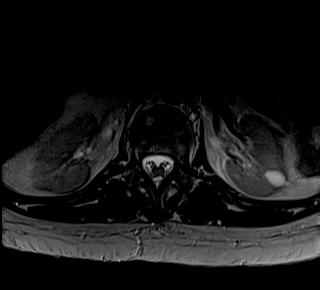
[im 6/36]
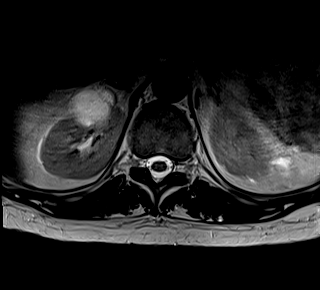
[im 11/36]
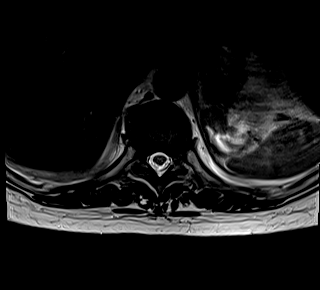
[im 17/36]
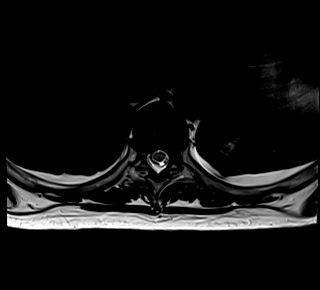
[im 19/36]
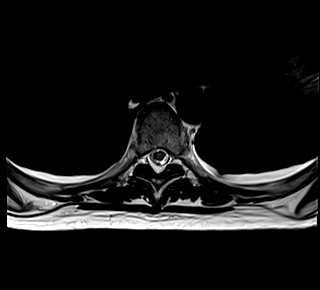
[im 25/36]
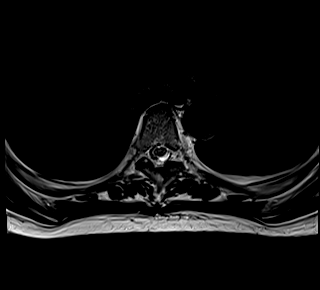
[im 30/36]
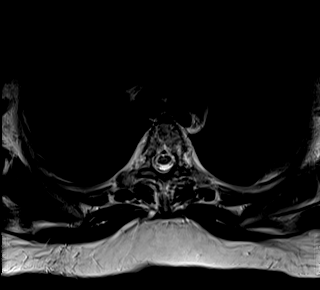
[im 36/36]
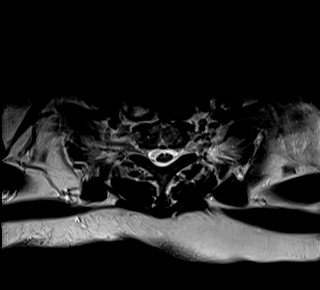

[22 of 48 positions shown; findings below may reference images not displayed]

FINDINGS: Alignment:  Normal.

Vertebrae: No fracture, evidence of discitis, or bone lesion.

Cord:  Normal signal and morphology.  No cord compression.

Paraspinal and other soft tissues: Small bilateral pleural effusions
are seen.

Disc levels:

The central canal and foramina are widely patent at all levels. Mild
disc bulging in the upper thoracic segments is incidentally noted.
Also seen are very shallow right paracentral protrusions at T6-7 and
T7-8.
IMPRESSION: Minimal degenerative disease of the thoracic spine. The central
canal is widely patent throughout. Negative for cord compression,
signal abnormality or other abnormality to explain the patient's
symptoms.

Small bilateral pleural effusions.

## 2021-06-12 ENCOUNTER — Other Ambulatory Visit: Payer: Self-pay

## 2021-06-12 ENCOUNTER — Other Ambulatory Visit: Payer: Medicare (Managed Care) | Admitting: Hospice

## 2021-06-12 DIAGNOSIS — M797 Fibromyalgia: Secondary | ICD-10-CM

## 2021-06-12 DIAGNOSIS — R600 Localized edema: Secondary | ICD-10-CM

## 2021-06-12 DIAGNOSIS — F339 Major depressive disorder, recurrent, unspecified: Secondary | ICD-10-CM

## 2021-06-12 DIAGNOSIS — Z515 Encounter for palliative care: Secondary | ICD-10-CM

## 2021-06-12 NOTE — Progress Notes (Signed)
[facility] Consult Note Telephone: [phone]  Fax: [phone]  PATIENT NAME: Jodi Bowman [place] [address] [id number] (home)  DOB: [birth year] MRN: [id number]  PRIMARY CARE PROVIDER:    Amie Critchley,  [address] [place] [address] [phone]  REFERRING PROVIDER:   Aletha Halim., PA-C [address]   [address] [phone]  RESPONSIBLE PARTY:   [facility] [phone] best number to call.  Contact Information     Name Relation Home Work [place] Spouse [phone]  [phone]   Althea Grimmer [phone]          I met face to face with patient and family at home. Palliative Care was asked to follow this patient by consultation request of  Aletha Halim., PA-C to address advance care planning, complex medical decision making and goals of care clarification. Daughter Rich Reining was present with patient during visit. Santiago Glad FNP student present with NP. This is the initial visit.    ASSESSMENT AND / RECOMMENDATIONS:   Advance Care Planning: Our advance care planning conversation included a discussion about:    The value and importance of advance care planning  Difference between Hospice and Palliative care Exploration of goals of care in the event of a sudden injury or illness  Identification and preparation of a healthcare agent  Review and updating or creation of an  advance directive document . Decision not to resuscitate or to de-escalate disease focused treatments due to poor prognosis.  CODE STATUS: Discussion on ramifications and implication of code status. Patient elects Full code. She wants everything done to keep her alive.   Goals of Care: Goals include to maximize quality of life and symptom management.   Visit consisted of counseling and education dealing with the complex and emotionally intense issues of symptom  management and palliative care in the setting of serious and potentially life-threatening illness. Palliative care team will continue to support patient, patient's family, and medical team.  Bible study and local attendance provide support and anchor for patient.   I spent  46 minutes providing this initial consultation. More than 50% of the time in this consultation was spent on counseling patient and coordinating communication. --------------------------------------------------------------------------------------------------------------------------------------  Symptom Management/Plan: Edema: BLE. Encouraged Elevation, support hose to promote circulation. Patient currently on Aldactone. Recommendation: Lasix 20m in the morning x 5 days. Potassium chloride  20 MEQ daily x 5 days. Reevaluate. CBC BMP. NP Called recommendation to PCP' s office- Tammy. Cardiologist consult as needed. Education provided on reduced salt, no added salt, avoid Processed food high in salt discussed. Encouraged and demonstrated elevation of BLE.  Chronic back pain: s/p back surgery, currently with implant stimulator /nerve blocker. Reports this is helpful. Patient on Norco  Morphine; goes to pain clinic. Patient wishes to taper dose and eventually get off narcotics; taper is ongoing.  Fibromyalgia: Managed with Cymbalta Depression: Lexapro. Continue with deep breathing, meditation/prayers, engage in activities of choice that bring joy.  Follow up: Palliative care will continue to follow for complex medical decision making, advance care planning, and clarification of goals. Return 6 weeks or prn.Encouraged to call provider sooner with any concerns.   Family /Caregiver/Community Supports: Patient lives at home with sister and daughter  HOSPICE ELIGIBILITY/DIAGNOSIS: TBD  Chief Complaint: Initial Palliative care visit  HISTORY OF PRESENT ILLNESS:  TMEIA EMLEYis a 60y.o. year old female  with multiple medical  conditions including bil lower extremity edema, intermittent, worsening in the last month, worse with sitting up for long time with feet in dependent position. Edema  makes mobility difficult for patient. Elevation is sometimes helpful. History of Fibromayalgia, low back pain, HTN, Depression. Patient denies shortness of breath, endorses discomfort in BLE due to edema.  History obtained from review of EMR, discussion with primary team, caregiver, family and/or Jodi Bowman.  Review and summarization of Epic records shows history from other than patient. Rest of 10 point ROS asked and negative.     Review of lab tests/diagnostics   Results for LULUBELLE, SIMCOE (MRN [id number]) as of 06/12/2021 09:22  Ref. Range 04/29/2021 13:08  WBC Latest Ref Range: 4.0 - 10.5 K/uL 10.4  RBC Latest Ref Range: 3.87 - 5.11 MIL/uL 3.79 (L)  Hemoglobin Latest Ref Range: 12.0 - 15.0 g/dL 11.7 (L)  HCT Latest Ref Range: 36.0 - 46.0 % 35.3 (L)  MCV Latest Ref Range: 80.0 - 100.0 fL 93.1  MCH Latest Ref Range: 26.0 - 34.0 pg 30.9  MCHC Latest Ref Range: 30.0 - 36.0 g/dL 33.1  RDW Latest Ref Range: 11.5 - 15.5 % 16.8 (H)  Platelets Latest Ref Range: 150 - 400 K/uL 204  nRBC Latest Ref Range: 0.0 - 0.2 % 0.0    ROS General: NAD EYES: denies vision changes ENMT: denies dysphagia Cardiovascular: denies chest pain/discomfort Pulmonary: denies cough, denies SOB Abdomen: endorses good appetite, denies constipation/diarrhea GU: denies dysuria, urinary frequency MSK:  endorses weakness,  no falls reported Skin: denies rashes or wounds Neurological: denies pain, denies insomnia Psych: Endorses positive mood Heme/lymph/immuno: denies bruises, abnormal bleeding  Physical Exam: Height/Weight: 4 feet 11 inches/ 122 Ibs down from 300 Ibs 3 years ago. A deliberate effort aided with gastric bypass.  Constitutional: NAD General: Well groomed, cooperative EYES: anicteric sclera, lids intact, no discharge  ENMT: Moist mucous  membrane CV: S1 S2, 2+ pitting edema in BLE Pulmonary: LCTA, no increased work of breathing, no cough, Abdomen: active BS + 4 quadrants, soft and non tender GU: no suprapubic tenderness MSK: weakness, limited ROM Skin: warm and dry, no rashes or wounds on visible skin Neuro:  weakness, otherwise non focal Psych: non-anxious affect Hem/lymph/immuno: no widespread bruising   PAST MEDICAL HISTORY:  Active Ambulatory Problems    Diagnosis Date Noted   LIPOMA 01/29/2010   GLUCOSE INTOLERANCE 03/12/2010   DYSLIPIDEMIA 03/12/2010   ANXIETY STATE NOS 09/11/2005   Chronic migraine 03/12/2010   Essential hypertension 01/01/2007   Carotid stenosis 03/12/2010   CEREBROVASCULAR ACCIDENT 03/12/2010   PHARYNGITIS 03/21/2009   ALLERGIC RHINITIS 03/17/2007   LOW BACK PAIN 03/17/2007   Backache 03/01/2009   HEADACHE 01/29/2010   ANKLE INJURY, RIGHT 04/12/2009   Gallstones 12/04/2011   Thyroid nodule-non neoplastic goiter by needle aspiration 12/04/2011   Lap chole IOC April 2013 01/29/2012   Atypical chest pain 03/12/2014   Syncope 07/30/2014   Left leg weakness    Low back pain with radiation    Chronic low back pain 08/01/2015   Right hip pain 08/01/2015   Abnormality of gait 08/01/2015   Vertigo 04/30/2017   Small vessel disease, cerebrovascular 04/30/2017   Chest pain, rule out acute myocardial infarction 12/17/2017   Hypokalemia 12/17/2017   Acute lower UTI 12/17/2017   S/P exploratory laparotomy 03/06/2018   Bowel obstruction ([place]) 03/06/2018   Lumbar foraminal stenosis 03/04/2019   Orthostatic hypotension 03/06/2019   Postoperative urinary retention 03/06/2019   Lumbar radiculopathy 03/08/2019  Urinary retention    Orthostasis    Acute blood loss anemia    Fibromyalgia    Chronic pain syndrome    Left otitis media    Infective otitis externa of left ear    Drainage from wound 04/02/2019   Postoperative seroma involving nervous system after nervous system procedure  04/05/2019   ACS (acute coronary syndrome) ([place]) 08/25/2019   Chest pain 08/25/2019   Weakness of left side of body 06/29/2020   Weakness 08/27/2020   Mental status change resolved 08/27/2020   Mood disorder ([place]) 03/12/2021   Dysphagia    Resolved Ambulatory Problems    Diagnosis Date Noted   AKI (acute kidney injury) ([place])    Acute metabolic encephalopathy 84/66/5993   TIA (transient ischemic attack) 57/09/7791   Metabolic encephalopathy 90/30/0923   Past Medical History:  Diagnosis Date   Arthritis    Calculus of gallbladder without mention of cholecystitis or obstruction    Dizziness    GERD (gastroesophageal reflux disease)    Goiter    Hypercholesteremia    Hypertension    Lower back pain    Migraine    Nontoxic uninodular goiter    Obesity    PONV (postoperative nausea and vomiting)    Sleep apnea    Stroke ([place]) 2000    SOCIAL HX:  Social History   Tobacco Use   Smoking status: Never   Smokeless tobacco: Never  Substance Use Topics   Alcohol use: Not Currently    Alcohol/week: 0.0 standard drinks     FAMILY HX:  Family History  Problem Relation Age of Onset   Diabetes Mother    Hypertension Mother    Transient ischemic attack Mother    Seizures Mother    Dementia Mother    Other Father        MVA   Cancer Brother        colon and lung   Cancer Maternal Grandmother        colon      ALLERGIES:  Allergies  Allergen Reactions   Shrimp [Shellfish Allergy] Anaphylaxis   Naproxen Other (See Comments)    Makes stomach cramp and burn badly   Shellfish-Derived Products     Other reaction(s): GI Upset (intolerance)      PERTINENT MEDICATIONS:  Outpatient Encounter Medications as of 06/12/2021  Medication Sig   albuterol (VENTOLIN HFA) 108 (90 Base) MCG/ACT inhaler Inhale 1-2 puffs into the lungs every 6 (six) hours as needed for wheezing or shortness of breath.   APIXABAN (ELIQUIS) VTE STARTER PACK (10MG AND 5MG) Take as directed on package:  start with two-33m tablets twice daily for 7 days. On day 8, switch to one-512mtablet twice daily.   aspirin EC 81 MG tablet Take 81 mg by mouth daily.   clindamycin (CLEOCIN) 300 MG capsule Take 1 capsule (300 mg total) by mouth 4 (four) times daily. X 7 days   D3-50 1.25 MG (50000 UT) capsule Take 50,000 Units by mouth 3 (three) times a week.   diclofenac Sodium (VOLTAREN) 1 % GEL Apply 4 g topically 2 (two) times daily as needed (back pain).   DULoxetine (CYMBALTA) 60 MG capsule Take 60 mg by mouth daily.   erythromycin ophthalmic ointment Place a 1/2 inch ribbon of ointment into the lower eyelid four times a day for 7 days   escitalopram (LEXAPRO) 10 MG tablet Take 10 mg by mouth at bedtime.   ferrous sulfate 325 (65 FE) MG EC tablet  Take 325 mg by mouth daily.   fluticasone (FLONASE) 50 MCG/ACT nasal spray Place 1 spray into both nostrils daily as needed for allergies.    folic acid (FOLVITE) 1 MG tablet Take 1 mg by mouth daily.   HYDROcodone-acetaminophen (NORCO) 10-325 MG tablet Take 1 tablet by mouth 2 (two) times daily as needed for moderate pain or severe pain.   isosorbide mononitrate (IMDUR) 30 MG 24 hr tablet Take 1 tablet (30 mg total) by mouth daily.   loratadine (CLARITIN) 10 MG tablet Take 10 mg by mouth daily.    Magnesium Oxide 400 MG CAPS Take 1 capsule (400 mg total) by mouth daily.   metoprolol succinate (TOPROL-XL) 100 MG 24 hr tablet Take 100 mg by mouth at bedtime.    mirtazapine (REMERON) 7.5 MG tablet Take 7.5 mg by mouth at bedtime.   morphine (MS CONTIN) 30 MG 12 hr tablet Take 30 mg by mouth every 12 (twelve) hours.   ondansetron (ZOFRAN-ODT) 4 MG disintegrating tablet Take 4 mg by mouth every 8 (eight) hours as needed for nausea/vomiting.   pantoprazole (PROTONIX) 40 MG tablet Take 1 tablet (40 mg total) by mouth 2 (two) times daily.   polyethylene glycol (MIRALAX / GLYCOLAX) 17 g packet Take 17 g by mouth daily as needed for mild constipation.   pregabalin  (LYRICA) 75 MG capsule Take 1 capsule (75 mg total) by mouth 3 (three) times daily.   spironolactone (ALDACTONE) 25 MG tablet Take 1 tablet (25 mg total) by mouth daily.   [DISCONTINUED] metoprolol (LOPRESSOR) 50 MG tablet Take 50 mg by mouth 2 (two) times daily.     [DISCONTINUED] simvastatin (ZOCOR) 20 MG tablet Take 20 mg by mouth at bedtime.     No facility-administered encounter medications on file as of 06/12/2021.     Thank you for the opportunity to participate in the care of Ms. Jakes.  The palliative care team will continue to follow. Please call our office at [phone] if we can be of additional assistance.   Note: Portions of this note were generated with [employer]. Dictation errors may occur despite best attempts at proofreading.  Teodoro Spray, NP

## 2021-06-24 NOTE — Progress Notes (Signed)
NEUROLOGY CONSULTATION NOTE  [name] MRN: [id number] DOB: 1961-05-30  Referring provider: Deno Etienne, DO (ED referral) Primary care provider: Bing Matter, PA-C  Reason for consult:  Stroke  Assessment/Plan:   1.j Left sided weakness/numbness and speech disturbance - MRI-negative right-sided thalamocapsular stroke possible but also may be functional or migraine-related as both MRI of brain and later CT perfusion were negative.  Still has residual left sided weakness which may be functional.   2.  Hypertension 3.  Fibromyalgia and chronic pain  Secondary stroke prevention as managed by PCP and cardiology On ASA 81mg  daily.  May continue but also consider changing to Plavix 75mg  daily LDL goal less than 70 Hgb A1c goal less than 7 Normotensive blood pressure Advised to continue exercises provided by physical therapy Follow up 6 months.   Subjective:  Jodi Bowman is a 60 year old female with HTN, HLD, morbid obesity s/p gastric bypass (2017) and history of stroke in 2000 who presents for TIA.  History supplemented by hospital records.  She is accompanied by a family friend.    On 03/12/2021, she suddenly became weak (both legs and left arm), falling to the floor.  Husband noted left sided facial droop.  She appeared somnolent and didn't remember falling but had taken opioids (which she was taking for fibromyalgia and currently being tapered off due to having a new spinal cord stimulator).  UDS positive for opioid and etoh negative.  CT of head and following MRI of brain showed mild chronic small vessel ischemic changes but no acute intracranial abnormality.  CTA head and neck showed no LVO or hemodynamically significant stenosis.  TTE showed EF 65-70% and showed possible mass vs artifact on ventricular side of AV leaflets.  Follow up TEE showed mild thickening of the coronary cusp of the aortic valve without thrombus or vegetation and no left atrial appendage thrombus or  mass.  LDL was 32 and Hgb A1c 4.3.  She was maintained on ASA 81mg  (already taking) but added Plavix 75mg  daily.  On 03/27/2021, she returned to the ED for acute left-sided weakness and difficulty talking.  She had associated headache (reports history of migraines).  On presentation she demonstrated slurred speech but while in the [place], she was noted to be talking clearly.  She exhibited inconsistent mild left sided weakness.  CT head/CT perfusion and CTA of head and neck were all negative.  Migraine suspected.  She still maintains that her entire left arm and hand is weak.   She also reports her entire hand is numb.  He continues to have numbness and weakness with legs.  She says that her speech is different.  It is hard to get words out.  Although the facial droop has resolved, she thinks that her face feels different.  She is no longer on Plavix but continues on ASA.       PAST MEDICAL HISTORY: Past Medical History:  Diagnosis Date   Arthritis    Calculus of gallbladder without mention of cholecystitis or obstruction    Dizziness    Fibromyalgia    GERD (gastroesophageal reflux disease)    Goiter    Hypercholesteremia    Hypertension    Lower back pain    Migraine    Nontoxic uninodular goiter    sees dr vollmer at [facility]   Obesity    PONV (postoperative nausea and vomiting)    Sleep apnea    STOPBANG=5   Stroke ([place]) 2000  PAST SURGICAL HISTORY: Past Surgical History:  Procedure Laterality Date   ABDOMINAL HYSTERECTOMY     BOWEL RESECTION N/A 03/06/2018   Procedure: SMALL BOWEL ANASTAMOSIS;  Surgeon: Clovis Riley, MD;  Location: [facility];  Service: General;  Laterality: N/A;   CATARACT EXTRACTION W/ INTRAOCULAR LENS IMPLANT Bilateral    CESAREAN SECTION  yrs ago   done x 2   CHOLECYSTECTOMY  01/05/2012   Procedure: LAPAROSCOPIC CHOLECYSTECTOMY WITH INTRAOPERATIVE CHOLANGIOGRAM;  Surgeon: Pedro Earls, MD;  Location: [facility];  Service: General;  Laterality: N/A;    COLONOSCOPY  10/08/2012   Procedure: COLONOSCOPY;  Surgeon: Beryle Beams, MD;  Location: [facility];  Service: Endoscopy;  Laterality: N/A;   KNEE ARTHROSCOPY  one 1995 and 1 in 1997   both knees done   LAPAROSCOPY N/A 03/06/2018   Procedure: LAPAROSCOPY DIAGNOSTIC WITH LYSIS OF ADHESIONS;  Surgeon: Clovis Riley, MD;  Location: [facility];  Service: General;  Laterality: N/A;   LAPAROTOMY N/A 03/06/2018   Procedure: EXPLORATORY LAPAROTOMY, RESECTION OF DISTAL ROUX, CLOSURE OF INTERNAL HERNIA;  Surgeon: Clovis Riley, MD;  Location: [facility];  Service: General;  Laterality: N/A;   LEFT HEART CATH AND CORONARY ANGIOGRAPHY N/A 08/29/2019   Procedure: LEFT HEART CATH AND CORONARY ANGIOGRAPHY;  Surgeon: Charolette Forward, MD;  Location: [facility];  Service: Cardiovascular;  Laterality: N/A;   LUMBAR WOUND DEBRIDEMENT N/A 04/03/2019   Procedure: LUMBAR WOUND WASHOUT;  Surgeon: Judith Part, MD;  Location: [facility];  Service: Neurosurgery;  Laterality: N/A;   POSTERIOR LUMBAR FUSION  03/04/2019   surgery for endometriosis  yrs ago   TEE WITHOUT CARDIOVERSION N/A 03/22/2021   Procedure: TRANSESOPHAGEAL ECHOCARDIOGRAM (TEE);  Surgeon: Skeet Latch, MD;  Location: [facility];  Service: Cardiovascular;  Laterality: N/A;   thryoid biopsy  December 01, 2011    at [facility]: Current Outpatient Medications on File Prior to Visit  Medication Sig Dispense Refill   albuterol (VENTOLIN HFA) 108 (90 Base) MCG/ACT inhaler Inhale 1-2 puffs into the lungs every 6 (six) hours as needed for wheezing or shortness of breath.     APIXABAN (ELIQUIS) VTE STARTER PACK (10MG  AND 5MG ) Take as directed on package: start with two-5mg  tablets twice daily for 7 days. On day 8, switch to one-5mg  tablet twice daily. 1 each 0   aspirin EC 81 MG tablet Take 81 mg by mouth daily.     clindamycin (CLEOCIN) 300 MG capsule Take 1 capsule (300 mg total) by mouth 4 (four) times daily. X 7 days 28 capsule 0   D3-50  1.25 MG (50000 UT) capsule Take 50,000 Units by mouth 3 (three) times a week.     diclofenac Sodium (VOLTAREN) 1 % GEL Apply 4 g topically 2 (two) times daily as needed (back pain).     DULoxetine (CYMBALTA) 60 MG capsule Take 60 mg by mouth daily.     erythromycin ophthalmic ointment Place a 1/2 inch ribbon of ointment into the lower eyelid four times a day for 7 days 3.5 g 0   escitalopram (LEXAPRO) 10 MG tablet Take 10 mg by mouth at bedtime.     ferrous sulfate 325 (65 FE) MG EC tablet Take 325 mg by mouth daily.     fluticasone (FLONASE) 50 MCG/ACT nasal spray Place 1 spray into both nostrils daily as needed for allergies.   0   folic acid (FOLVITE) 1 MG tablet Take 1 mg by mouth daily.  11   HYDROcodone-acetaminophen (NORCO) 10-325 MG tablet Take 1 tablet by mouth 2 (two) times daily as needed for moderate pain or severe pain.     isosorbide mononitrate (IMDUR) 30 MG 24 hr tablet Take 1 tablet (30 mg total) by mouth daily. 30 tablet 0   loratadine (CLARITIN) 10 MG tablet Take 10 mg by mouth daily.      Magnesium Oxide 400 MG CAPS Take 1 capsule (400 mg total) by mouth daily. 15 capsule 0   metoprolol succinate (TOPROL-XL) 100 MG 24 hr tablet Take 100 mg by mouth at bedtime.      mirtazapine (REMERON) 7.5 MG tablet Take 7.5 mg by mouth at bedtime.     morphine (MS CONTIN) 30 MG 12 hr tablet Take 30 mg by mouth every 12 (twelve) hours.     ondansetron (ZOFRAN-ODT) 4 MG disintegrating tablet Take 4 mg by mouth every 8 (eight) hours as needed for nausea/vomiting.     pantoprazole (PROTONIX) 40 MG tablet Take 1 tablet (40 mg total) by mouth 2 (two) times daily. 60 tablet 0   polyethylene glycol (MIRALAX / GLYCOLAX) 17 g packet Take 17 g by mouth daily as needed for mild constipation. 14 each 0   pregabalin (LYRICA) 75 MG capsule Take 1 capsule (75 mg total) by mouth 3 (three) times daily. 90 capsule 0   spironolactone (ALDACTONE) 25 MG tablet Take 1 tablet (25 mg total) by mouth daily. 30 tablet  0   [DISCONTINUED] metoprolol (LOPRESSOR) 50 MG tablet Take 50 mg by mouth 2 (two) times daily.       [DISCONTINUED] simvastatin (ZOCOR) 20 MG tablet Take 20 mg by mouth at bedtime.       No current facility-administered medications on file prior to visit.    ALLERGIES: Allergies  Allergen Reactions   Shrimp [Shellfish Allergy] Anaphylaxis   Naproxen Other (See Comments)    Makes stomach cramp and burn badly   Shellfish-Derived Products     Other reaction(s): GI Upset (intolerance)    FAMILY HISTORY: Family History  Problem Relation Age of Onset   Diabetes Mother    Hypertension Mother    Transient ischemic attack Mother    Seizures Mother    Dementia Mother    Other Father        MVA   Cancer Brother        colon and lung   Cancer Maternal Grandmother        colon    Objective:  Blood pressure (!) 149/89, pulse 85, height 4\' 11"  (1.499 m), weight 113 lb 3.2 oz (51.3 kg), SpO2 95 %. General: No acute distress.  Patient appears well-groomed.   Head:  Normocephalic/atraumatic Eyes:  fundi examined but not visualized Neck: supple, no paraspinal tenderness, full range of motion Back: No paraspinal tenderness Heart: regular rate and rhythm Lungs: Clear to auscultation bilaterally. Vascular: No carotid bruits. Neurological Exam: Mental status: alert and oriented to person, place, and time, recent and remote memory intact, fund of knowledge intact, attention and concentration intact, speech fluent and not dysarthric, language intact. Cranial nerves: CN I: not tested CN II: pupils equal, round and reactive to light, visual fields intact CN III, IV, VI:  difficulty tracking (also noted during hospitalization), no nystagmus, no ptosis CN V: endorses reduced left V2-V3. CN VII: upper and lower face symmetric CN VIII: hearing intact CN IX, X: gag intact, uvula midline CN XI: sternocleidomastoid and trapezius muscles intact CN XII: tongue midline Bulk & Tone:  normal, no  fasciculations. Motor:  muscle strength 5/5 right upper extremity, 4/5 in left upper extremity and 3/5 bilateral lower extremities with reduced effort Sensation:  Pinprick sensation reduced left upper and lower extremities, vibratory sensation reduced in toes bilaterally Deep Tendon Reflexes:  2+ throughout,  toes downgoing.   Finger to nose testing:  bilateral dysmetria Gait:  Broad-based unsteady gait with reduced stride.  Ambulates with walker.      Thank you for allowing me to take part in the care of this patient.  Metta Clines, DO  CC: Bing Matter, PA-C

## 2021-06-25 ENCOUNTER — Ambulatory Visit: Payer: Medicare (Managed Care) | Admitting: Neurology

## 2021-06-25 ENCOUNTER — Encounter: Payer: Self-pay | Admitting: Neurology

## 2021-06-25 ENCOUNTER — Other Ambulatory Visit: Payer: Self-pay

## 2021-06-25 VITALS — BP 149/89 | HR 85 | Ht 59.0 in | Wt 113.2 lb

## 2021-06-25 DIAGNOSIS — I639 Cerebral infarction, unspecified: Secondary | ICD-10-CM | POA: Diagnosis not present

## 2021-06-25 DIAGNOSIS — I10 Essential (primary) hypertension: Secondary | ICD-10-CM

## 2021-06-25 NOTE — Patient Instructions (Signed)
Continue aspirin 81mg  daily.  Your PCP or cardiologist may want to consider changing to clopidogrel, a different blood thinner. Mediterranean diet (see below) Continue exercises from the physical therapist Follow up 6 months.

## 2021-07-10 ENCOUNTER — Other Ambulatory Visit: Payer: Self-pay

## 2021-07-10 ENCOUNTER — Other Ambulatory Visit: Payer: Medicare (Managed Care) | Admitting: Hospice

## 2021-07-10 DIAGNOSIS — M5442 Lumbago with sciatica, left side: Secondary | ICD-10-CM

## 2021-07-10 DIAGNOSIS — Z515 Encounter for palliative care: Secondary | ICD-10-CM

## 2021-07-10 DIAGNOSIS — G8929 Other chronic pain: Secondary | ICD-10-CM

## 2021-07-10 DIAGNOSIS — F339 Major depressive disorder, recurrent, unspecified: Secondary | ICD-10-CM

## 2021-07-10 DIAGNOSIS — R600 Localized edema: Secondary | ICD-10-CM

## 2021-07-10 NOTE — Progress Notes (Signed)
[facility] Consult Note Telephone: [phone]  Fax: [phone]  PATIENT NAME: Jodi Bowman DOB: [birth year] MRN: [id number]  PRIMARY CARE PROVIDER:   Aletha Halim., PA-C Aletha Halim., PA-C [address]  [place] [address]  REFERRING PROVIDER: Aletha Halim., PA-C Aletha Halim., PA-C [address]  [place] [address]  RESPONSIBLE PARTY:   JOAC [phone] best number to call.  Contact Information     Name Relation Home Work [place] Spouse [phone]  [phone]   Althea Grimmer [phone]         Visit is to build trust and highlight Palliative Medicine as specialized medical care for people living with serious illness, aimed at facilitating better quality of life through symptoms relief, assisting with advance care planning and complex medical decision making. This is a follow up visit.  RECOMMENDATIONS/PLAN:   Advance Care Planning/Code Status: Patient remains a Full code  Goals of Care: Goals of care include to maximize quality of life and symptom management.  Visit consisted of counseling and education dealing with the complex and emotionally intense issues of symptom management and palliative care in the setting of serious and potentially life-threatening illness. Palliative care team will continue to support patient, patient's family, and medical team.  Edema: Improved. Continue elevation and support hose to promote circulation. Patient currently on Aldactone. Cardiologist consult as needed. Report if edema reoccurs/weight gain 2 Ibs in a day ot 5 Ibs in a week for Lasix burst as needed.  Education reiterated on reduced salt, no added salt, avoid Processed food high in salt discussed.  Chronic back pain: s/p back surgery, currently with implant stimulator /nerve blocker. Reports this is helpful. Patient on Norco  Morphine on lowered dose with plan to  wean. Patient is managed a pain clinic.   Depression: Lexapro. Continue with meditation/prayers, engage in activities of choice that bring joy.  Follow up: Palliative care will continue to follow for complex medical decision making, advance care planning, and clarification of goals. Return 6 weeks or prn. Encouraged to call provider sooner with any concerns.    Family /Caregiver/Community Supports: Patient lives at home with her spouse, sister and daughter   HOSPICE ELIGIBILITY/DIAGNOSIS: TBD   Chief Complaint: Follow up visit   HISTORY OF PRESENT ILLNESS:  Jodi Bowman is a 60 y.o. year old female  with multiple medical conditions including bil lower extremity edema which has improved since patient started and completed Lasix. Patient reports feeling and breathing better, no pain/discomfort. History of Fibromayalgia, low back pain with sciatica, HTN, Depression. History obtained from review of EMR, discussion with primary team, family and/or patient. Records reviewed and summarized above. All 10 point systems reviewed and are negative except as documented in history of present illness above  Review and summarization of Epic records shows history from other than patient.   Palliative Care was asked to follow this patient o help address complex decision making in the context of advance care planning and goals of care clarification.   PHYSICAL EXAM  Height/Weight: 4 feet 11 inches/114 Ibs General: In no acute distress, appropriately dressed, cooperative Cardiovascular: regular rate and rhythm; trace edema to BLE, an improvement from 2+ pitting last month Pulmonary: no cough, no increased work of breathing, normal respiratory effort Abdomen: soft, non tender, no guarding, positive bowel sounds in all quadrants GU:  no suprapubic tenderness Eyes: Normal lids, no discharge ENMT: Moist mucous  membranes Musculoskeletal:  weakness, sarcopenia Skin: no rash to visible skin, warm without  cyanosis,  Psych: non-anxious affect Neurological: Weakness but otherwise non focal Heme/lymph/immuno: no bruises, no bleeding  PERTINENT MEDICATIONS:  Outpatient Encounter Medications as of 07/10/2021  Medication Sig   albuterol (VENTOLIN HFA) 108 (90 Base) MCG/ACT inhaler Inhale 1-2 puffs into the lungs every 6 (six) hours as needed for wheezing or shortness of breath.   APIXABAN (ELIQUIS) VTE STARTER PACK (10MG  AND 5MG ) Take as directed on package: start with two-5mg  tablets twice daily for 7 days. On day 8, switch to one-5mg  tablet twice daily. (Patient not taking: Reported on 06/25/2021)   aspirin EC 81 MG tablet Take 81 mg by mouth daily.   clindamycin (CLEOCIN) 300 MG capsule Take 1 capsule (300 mg total) by mouth 4 (four) times daily. X 7 days (Patient not taking: Reported on 06/25/2021)   D3-50 1.25 MG (50000 UT) capsule Take 50,000 Units by mouth 3 (three) times a week.   diclofenac Sodium (VOLTAREN) 1 % GEL Apply 4 g topically 2 (two) times daily as needed (back pain).   DULoxetine (CYMBALTA) 60 MG capsule Take 60 mg by mouth daily.   erythromycin ophthalmic ointment Place a 1/2 inch ribbon of ointment into the lower eyelid four times a day for 7 days (Patient not taking: Reported on 06/25/2021)   escitalopram (LEXAPRO) 10 MG tablet Take 10 mg by mouth at bedtime.   ferrous sulfate 325 (65 FE) MG EC tablet Take 325 mg by mouth daily.   fluticasone (FLONASE) 50 MCG/ACT nasal spray Place 1 spray into both nostrils daily as needed for allergies.    folic acid (FOLVITE) 1 MG tablet Take 1 mg by mouth daily.   HYDROcodone-acetaminophen (NORCO) 10-325 MG tablet Take 1 tablet by mouth 2 (two) times daily as needed for moderate pain or severe pain.   isosorbide mononitrate (IMDUR) 30 MG 24 hr tablet Take 1 tablet (30 mg total) by mouth daily. (Patient not taking: Reported on 06/25/2021)   loratadine (CLARITIN) 10 MG tablet Take 10 mg by mouth daily.    Magnesium Oxide 400 MG CAPS Take 1 capsule  (400 mg total) by mouth daily.   metoprolol succinate (TOPROL-XL) 100 MG 24 hr tablet Take 100 mg by mouth at bedtime.    mirtazapine (REMERON) 7.5 MG tablet Take 7.5 mg by mouth at bedtime.   morphine (MS CONTIN) 30 MG 12 hr tablet Take 30 mg by mouth every 12 (twelve) hours.   ondansetron (ZOFRAN-ODT) 4 MG disintegrating tablet Take 4 mg by mouth every 8 (eight) hours as needed for nausea/vomiting.   pantoprazole (PROTONIX) 40 MG tablet Take 1 tablet (40 mg total) by mouth 2 (two) times daily.   polyethylene glycol (MIRALAX / GLYCOLAX) 17 g packet Take 17 g by mouth daily as needed for mild constipation.   pregabalin (LYRICA) 75 MG capsule Take 1 capsule (75 mg total) by mouth 3 (three) times daily. (Patient not taking: Reported on 06/25/2021)   spironolactone (ALDACTONE) 25 MG tablet Take 1 tablet (25 mg total) by mouth daily.   [DISCONTINUED] metoprolol (LOPRESSOR) 50 MG tablet Take 50 mg by mouth 2 (two) times daily.     [DISCONTINUED] simvastatin (ZOCOR) 20 MG tablet Take 20 mg by mouth at bedtime.     No facility-administered encounter medications on file as of 07/10/2021.    HOSPICE ELIGIBILITY/DIAGNOSIS: TBD  PAST MEDICAL HISTORY:  Past Medical History:  Diagnosis Date   Arthritis    Calculus of  gallbladder without mention of cholecystitis or obstruction    Dizziness    Fibromyalgia    GERD (gastroesophageal reflux disease)    Goiter    Hypercholesteremia    Hypertension    Lower back pain    Migraine    Nontoxic uninodular goiter    sees dr vollmer at [facility]   Obesity    PONV (postoperative nausea and vomiting)    Sleep apnea    STOPBANG=5   Stroke ([facility]) 2000     ALLERGIES:  Allergies  Allergen Reactions   Shrimp [Shellfish Allergy] Anaphylaxis   Naproxen Other (See Comments)    Makes stomach cramp and burn badly   Shellfish-Derived Products     Other reaction(s): GI Upset (intolerance)      I spent 40 minutes providing this consultation; this includes  time spent with patient/family, chart review and documentation. More than 50% of the time in this consultation was spent on counseling and coordinating communication   Thank you for the opportunity to participate in the care of Jodi Bowman Please call our office at [phone] if we can be of additional assistance.  Note: Portions of this note were generated with [employer]. Dictation errors may occur despite best attempts at proofreading.  Teodoro Spray, NP

## 2021-07-12 ENCOUNTER — Inpatient Hospital Stay (HOSPITAL_COMMUNITY)
Admission: EM | Admit: 2021-07-12 | Discharge: 2021-07-30 | DRG: 208 | Disposition: E | Payer: Medicare (Managed Care) | Attending: Pulmonary Disease | Admitting: Pulmonary Disease

## 2021-07-12 ENCOUNTER — Other Ambulatory Visit: Payer: Self-pay

## 2021-07-12 ENCOUNTER — Emergency Department (HOSPITAL_COMMUNITY): Payer: Medicare (Managed Care)

## 2021-07-12 ENCOUNTER — Encounter (HOSPITAL_COMMUNITY): Payer: Self-pay | Admitting: *Deleted

## 2021-07-12 DIAGNOSIS — E43 Unspecified severe protein-calorie malnutrition: Secondary | ICD-10-CM | POA: Diagnosis present

## 2021-07-12 DIAGNOSIS — E876 Hypokalemia: Secondary | ICD-10-CM | POA: Diagnosis present

## 2021-07-12 DIAGNOSIS — A4102 Sepsis due to Methicillin resistant Staphylococcus aureus: Secondary | ICD-10-CM | POA: Diagnosis not present

## 2021-07-12 DIAGNOSIS — R32 Unspecified urinary incontinence: Secondary | ICD-10-CM | POA: Diagnosis present

## 2021-07-12 DIAGNOSIS — I469 Cardiac arrest, cause unspecified: Secondary | ICD-10-CM | POA: Diagnosis not present

## 2021-07-12 DIAGNOSIS — Z515 Encounter for palliative care: Secondary | ICD-10-CM

## 2021-07-12 DIAGNOSIS — T82514A Breakdown (mechanical) of infusion catheter, initial encounter: Secondary | ICD-10-CM | POA: Diagnosis not present

## 2021-07-12 DIAGNOSIS — I69354 Hemiplegia and hemiparesis following cerebral infarction affecting left non-dominant side: Secondary | ICD-10-CM

## 2021-07-12 DIAGNOSIS — Z888 Allergy status to other drugs, medicaments and biological substances status: Secondary | ICD-10-CM

## 2021-07-12 DIAGNOSIS — U071 COVID-19: Secondary | ICD-10-CM | POA: Diagnosis not present

## 2021-07-12 DIAGNOSIS — I10 Essential (primary) hypertension: Secondary | ICD-10-CM | POA: Diagnosis present

## 2021-07-12 DIAGNOSIS — G931 Anoxic brain damage, not elsewhere classified: Secondary | ICD-10-CM | POA: Diagnosis not present

## 2021-07-12 DIAGNOSIS — E162 Hypoglycemia, unspecified: Secondary | ICD-10-CM | POA: Diagnosis not present

## 2021-07-12 DIAGNOSIS — E8809 Other disorders of plasma-protein metabolism, not elsewhere classified: Secondary | ICD-10-CM | POA: Diagnosis present

## 2021-07-12 DIAGNOSIS — G8929 Other chronic pain: Secondary | ICD-10-CM | POA: Diagnosis present

## 2021-07-12 DIAGNOSIS — E7800 Pure hypercholesterolemia, unspecified: Secondary | ICD-10-CM | POA: Diagnosis present

## 2021-07-12 DIAGNOSIS — Z6822 Body mass index (BMI) 22.0-22.9, adult: Secondary | ICD-10-CM

## 2021-07-12 DIAGNOSIS — A419 Sepsis, unspecified organism: Secondary | ICD-10-CM | POA: Diagnosis not present

## 2021-07-12 DIAGNOSIS — R627 Adult failure to thrive: Secondary | ICD-10-CM | POA: Diagnosis present

## 2021-07-12 DIAGNOSIS — R601 Generalized edema: Secondary | ICD-10-CM | POA: Diagnosis present

## 2021-07-12 DIAGNOSIS — M1990 Unspecified osteoarthritis, unspecified site: Secondary | ICD-10-CM | POA: Diagnosis present

## 2021-07-12 DIAGNOSIS — M545 Low back pain, unspecified: Secondary | ICD-10-CM | POA: Diagnosis present

## 2021-07-12 DIAGNOSIS — Z833 Family history of diabetes mellitus: Secondary | ICD-10-CM

## 2021-07-12 DIAGNOSIS — M79605 Pain in left leg: Secondary | ICD-10-CM | POA: Diagnosis not present

## 2021-07-12 DIAGNOSIS — R451 Restlessness and agitation: Secondary | ICD-10-CM | POA: Diagnosis not present

## 2021-07-12 DIAGNOSIS — Z7982 Long term (current) use of aspirin: Secondary | ICD-10-CM

## 2021-07-12 DIAGNOSIS — K219 Gastro-esophageal reflux disease without esophagitis: Secondary | ICD-10-CM | POA: Diagnosis present

## 2021-07-12 DIAGNOSIS — E872 Acidosis, unspecified: Secondary | ICD-10-CM

## 2021-07-12 DIAGNOSIS — Z981 Arthrodesis status: Secondary | ICD-10-CM

## 2021-07-12 DIAGNOSIS — R29810 Facial weakness: Secondary | ICD-10-CM | POA: Diagnosis not present

## 2021-07-12 DIAGNOSIS — Z86711 Personal history of pulmonary embolism: Secondary | ICD-10-CM

## 2021-07-12 DIAGNOSIS — Z90710 Acquired absence of both cervix and uterus: Secondary | ICD-10-CM

## 2021-07-12 DIAGNOSIS — E785 Hyperlipidemia, unspecified: Secondary | ICD-10-CM | POA: Diagnosis present

## 2021-07-12 DIAGNOSIS — J9601 Acute respiratory failure with hypoxia: Secondary | ICD-10-CM

## 2021-07-12 DIAGNOSIS — Z4659 Encounter for fitting and adjustment of other gastrointestinal appliance and device: Secondary | ICD-10-CM

## 2021-07-12 DIAGNOSIS — G4733 Obstructive sleep apnea (adult) (pediatric): Secondary | ICD-10-CM | POA: Diagnosis present

## 2021-07-12 DIAGNOSIS — I495 Sick sinus syndrome: Secondary | ICD-10-CM | POA: Diagnosis not present

## 2021-07-12 DIAGNOSIS — Z7901 Long term (current) use of anticoagulants: Secondary | ICD-10-CM

## 2021-07-12 DIAGNOSIS — Z8249 Family history of ischemic heart disease and other diseases of the circulatory system: Secondary | ICD-10-CM

## 2021-07-12 DIAGNOSIS — D649 Anemia, unspecified: Secondary | ICD-10-CM | POA: Diagnosis present

## 2021-07-12 DIAGNOSIS — R0902 Hypoxemia: Secondary | ICD-10-CM

## 2021-07-12 DIAGNOSIS — M797 Fibromyalgia: Secondary | ICD-10-CM | POA: Diagnosis present

## 2021-07-12 DIAGNOSIS — K912 Postsurgical malabsorption, not elsewhere classified: Secondary | ICD-10-CM | POA: Diagnosis present

## 2021-07-12 DIAGNOSIS — Z66 Do not resuscitate: Secondary | ICD-10-CM | POA: Diagnosis not present

## 2021-07-12 DIAGNOSIS — Z79899 Other long term (current) drug therapy: Secondary | ICD-10-CM

## 2021-07-12 DIAGNOSIS — G928 Other toxic encephalopathy: Secondary | ICD-10-CM | POA: Diagnosis not present

## 2021-07-12 DIAGNOSIS — Z452 Encounter for adjustment and management of vascular access device: Secondary | ICD-10-CM

## 2021-07-12 DIAGNOSIS — R6521 Severe sepsis with septic shock: Secondary | ICD-10-CM | POA: Diagnosis not present

## 2021-07-12 DIAGNOSIS — A400 Sepsis due to streptococcus, group A: Secondary | ICD-10-CM | POA: Diagnosis not present

## 2021-07-12 DIAGNOSIS — Z9884 Bariatric surgery status: Secondary | ICD-10-CM

## 2021-07-12 DIAGNOSIS — R55 Syncope and collapse: Secondary | ICD-10-CM | POA: Diagnosis present

## 2021-07-12 DIAGNOSIS — R0600 Dyspnea, unspecified: Secondary | ICD-10-CM

## 2021-07-12 DIAGNOSIS — E871 Hypo-osmolality and hyponatremia: Secondary | ICD-10-CM | POA: Diagnosis not present

## 2021-07-12 DIAGNOSIS — R8290 Unspecified abnormal findings in urine: Secondary | ICD-10-CM | POA: Diagnosis not present

## 2021-07-12 DIAGNOSIS — R339 Retention of urine, unspecified: Secondary | ICD-10-CM | POA: Diagnosis present

## 2021-07-12 LAB — CBC WITH DIFFERENTIAL/PLATELET
Abs Immature Granulocytes: 0.03 10*3/uL (ref 0.00–0.07)
Basophils Absolute: 0 10*3/uL (ref 0.0–0.1)
Basophils Relative: 0 %
Eosinophils Absolute: 0 10*3/uL (ref 0.0–0.5)
Eosinophils Relative: 0 %
HCT: 26.3 % — ABNORMAL LOW (ref 36.0–46.0)
Hemoglobin: 8.7 g/dL — ABNORMAL LOW (ref 12.0–15.0)
Immature Granulocytes: 0 %
Lymphocytes Relative: 22 %
Lymphs Abs: 1.7 10*3/uL (ref 0.7–4.0)
MCH: 32.5 pg (ref 26.0–34.0)
MCHC: 33.1 g/dL (ref 30.0–36.0)
MCV: 98.1 fL (ref 80.0–100.0)
Monocytes Absolute: 0.6 10*3/uL (ref 0.1–1.0)
Monocytes Relative: 8 %
Neutro Abs: 5.3 10*3/uL (ref 1.7–7.7)
Neutrophils Relative %: 70 %
Platelets: 282 10*3/uL (ref 150–400)
RBC: 2.68 MIL/uL — ABNORMAL LOW (ref 3.87–5.11)
RDW: 18.7 % — ABNORMAL HIGH (ref 11.5–15.5)
WBC: 7.5 10*3/uL (ref 4.0–10.5)
nRBC: 0.3 % — ABNORMAL HIGH (ref 0.0–0.2)

## 2021-07-12 LAB — BASIC METABOLIC PANEL
Anion gap: 6 (ref 5–15)
BUN: 12 mg/dL (ref 8–23)
CO2: 27 mmol/L (ref 22–32)
Calcium: 7.4 mg/dL — ABNORMAL LOW (ref 8.9–10.3)
Chloride: 105 mmol/L (ref 98–111)
Creatinine, Ser: 0.91 mg/dL (ref 0.44–1.00)
GFR, Estimated: >60 mL/min (ref >60)
Glucose, Bld: 89 mg/dL (ref 70–99)
Potassium: 2.7 mmol/L — CL (ref 3.5–5.1)
Sodium: 138 mmol/L (ref 135–145)

## 2021-07-12 LAB — BRAIN NATRIURETIC PEPTIDE: B Natriuretic Peptide: 42.6 pg/mL (ref 0.0–100.0)

## 2021-07-12 MED ORDER — ACETAMINOPHEN 500 MG PO TABS
1000.0000 mg | ORAL_TABLET | Freq: Once | ORAL | Status: AC
  Administered 2021-07-12: 1000 mg via ORAL
  Filled 2021-07-12: qty 2

## 2021-07-12 NOTE — ED Triage Notes (Signed)
The pts pot was called from lab   low  chg notified acuity increased

## 2021-07-12 NOTE — ED Triage Notes (Signed)
The pt woke up with dizziness    she fell and struck the back of her head.  She is unable to stand without help since then she has leg swelling also and some sobn

## 2021-07-12 NOTE — ED Provider Notes (Signed)
Emergency Medicine Provider Triage Evaluation Note  Jodi Bowman , a 60 y.o. female  was evaluated in triage.  Pt complains of dizzy.  Review of Systems  Positive: Dizzy, weak, lightheadedness, fluid retention, sob Negative: Fever, dysuria, focal numbness  Physical Exam  There were no vitals taken for this visit. Gen:   Awake, no distress   Resp:  Normal effort  MSK:   Moves extremities without difficulty  Other:  anasarca  Medical Decision Making  Medically screening exam initiated at 8:38 PM.  Appropriate orders placed.  Jodi Bowman was informed that the remainder of the evaluation will be completed by another provider, this initial triage assessment does not replace that evaluation, and the importance of remaining in the ED until their evaluation is complete.  Pt report feeling weak, dizzy, and lightheaded each time she stands up.  Fell earlier today. On blood thinner. Endorse fluid retention.    Jodi Moras, PA-C 07/20/2021 2040    Jodi Rasmussen, MD 07/13/2021 [phone]

## 2021-07-13 ENCOUNTER — Observation Stay (HOSPITAL_COMMUNITY): Payer: Medicare (Managed Care)

## 2021-07-13 ENCOUNTER — Emergency Department (HOSPITAL_COMMUNITY): Payer: Medicare (Managed Care)

## 2021-07-13 DIAGNOSIS — U071 COVID-19: Secondary | ICD-10-CM | POA: Diagnosis not present

## 2021-07-13 DIAGNOSIS — R601 Generalized edema: Secondary | ICD-10-CM

## 2021-07-13 LAB — URINALYSIS, COMPLETE (UACMP) WITH MICROSCOPIC
Bilirubin Urine: NEGATIVE
Glucose, UA: NEGATIVE mg/dL
Ketones, ur: NEGATIVE mg/dL
Nitrite: POSITIVE — AB
Protein, ur: NEGATIVE mg/dL
Specific Gravity, Urine: 1.014 (ref 1.005–1.030)
pH: 5 (ref 5.0–8.0)

## 2021-07-13 LAB — HEPATIC FUNCTION PANEL
ALT: 34 U/L (ref 0–44)
AST: 23 U/L (ref 15–41)
Albumin: 1.6 g/dL — ABNORMAL LOW (ref 3.5–5.0)
Alkaline Phosphatase: 74 U/L (ref 38–126)
Bilirubin, Direct: 1.5 mg/dL — ABNORMAL HIGH (ref 0.0–0.2)
Indirect Bilirubin: 1.4 mg/dL — ABNORMAL HIGH (ref 0.3–0.9)
Total Bilirubin: 2.9 mg/dL — ABNORMAL HIGH (ref 0.3–1.2)
Total Protein: 4.8 g/dL — ABNORMAL LOW (ref 6.5–8.1)

## 2021-07-13 LAB — TSH: TSH: 3.098 u[IU]/mL (ref 0.350–4.500)

## 2021-07-13 LAB — HIV ANTIBODY (ROUTINE TESTING W REFLEX): HIV Screen 4th Generation wRfx: NONREACTIVE

## 2021-07-13 LAB — RESP PANEL BY RT-PCR (FLU A&B, COVID) ARPGX2
Influenza A by PCR: NEGATIVE
Influenza B by PCR: NEGATIVE
SARS Coronavirus 2 by RT PCR: POSITIVE — AB

## 2021-07-13 LAB — PREALBUMIN: Prealbumin: 7.3 mg/dL — ABNORMAL LOW (ref 18–38)

## 2021-07-13 LAB — MAGNESIUM: Magnesium: 1.3 mg/dL — ABNORMAL LOW (ref 1.7–2.4)

## 2021-07-13 LAB — PROTIME-INR
INR: 1.1 (ref 0.8–1.2)
Prothrombin Time: 14.4 seconds (ref 11.4–15.2)

## 2021-07-13 MED ORDER — APIXABAN 5 MG PO TABS
5.0000 mg | ORAL_TABLET | Freq: Two times a day (BID) | ORAL | Status: DC
  Administered 2021-07-13 – 2021-07-16 (×8): 5 mg via ORAL
  Filled 2021-07-13 (×9): qty 1

## 2021-07-13 MED ORDER — NIRMATRELVIR/RITONAVIR (PAXLOVID) TABLET (RENAL DOSING)
2.0000 | ORAL_TABLET | Freq: Two times a day (BID) | ORAL | Status: DC
  Filled 2021-07-13: qty 20

## 2021-07-13 MED ORDER — MAGNESIUM SULFATE 2 GM/50ML IV SOLN
2.0000 g | Freq: Once | INTRAVENOUS | Status: AC
  Administered 2021-07-13: 2 g via INTRAVENOUS
  Filled 2021-07-13: qty 50

## 2021-07-13 MED ORDER — ENOXAPARIN SODIUM 40 MG/0.4ML IJ SOSY
40.0000 mg | PREFILLED_SYRINGE | INTRAMUSCULAR | Status: DC
  Filled 2021-07-13: qty 0.4

## 2021-07-13 MED ORDER — POTASSIUM CHLORIDE 10 MEQ/100ML IV SOLN
10.0000 meq | INTRAVENOUS | Status: AC
  Administered 2021-07-13 (×3): 10 meq via INTRAVENOUS
  Filled 2021-07-13: qty 100

## 2021-07-13 MED ORDER — POTASSIUM CHLORIDE CRYS ER 20 MEQ PO TBCR
40.0000 meq | EXTENDED_RELEASE_TABLET | Freq: Once | ORAL | Status: DC
  Filled 2021-07-13: qty 2

## 2021-07-13 MED ORDER — POTASSIUM CHLORIDE CRYS ER 20 MEQ PO TBCR
40.0000 meq | EXTENDED_RELEASE_TABLET | Freq: Once | ORAL | Status: AC
  Administered 2021-07-13: 40 meq via ORAL
  Filled 2021-07-13: qty 2

## 2021-07-13 MED ORDER — MOLNUPIRAVIR EUA 200MG CAPSULE
4.0000 | ORAL_CAPSULE | Freq: Two times a day (BID) | ORAL | Status: DC
  Administered 2021-07-13: 800 mg via ORAL
  Filled 2021-07-13 (×2): qty 4

## 2021-07-13 MED ORDER — ONDANSETRON HCL 4 MG/2ML IJ SOLN: 4.0000 mg | Freq: Four times a day (QID) | INTRAMUSCULAR | Status: DC | PRN

## 2021-07-13 MED ORDER — ACETAMINOPHEN 325 MG PO TABS
650.0000 mg | ORAL_TABLET | Freq: Four times a day (QID) | ORAL | Status: DC | PRN
  Administered 2021-07-13 – 2021-07-17 (×12): 650 mg via ORAL
  Filled 2021-07-13 (×12): qty 2

## 2021-07-13 MED ORDER — CHLORHEXIDINE GLUCONATE CLOTH 2 % EX PADS
6.0000 | MEDICATED_PAD | Freq: Every day | CUTANEOUS | Status: DC
  Administered 2021-07-14 – 2021-07-17 (×4): 6 via TOPICAL

## 2021-07-13 MED ORDER — ONDANSETRON HCL 4 MG PO TABS: 4.0000 mg | ORAL_TABLET | Freq: Four times a day (QID) | ORAL | Status: DC | PRN

## 2021-07-13 NOTE — ED Provider Notes (Signed)
[facility] EMERGENCY DEPARTMENT Provider Note   CSN: [id number] Arrival date & time: 07/18/2021  2009     History Chief Complaint  Patient presents with   Dizziness    Jodi Bowman is a 60 y.o. female.  The history is provided by the patient, the spouse and medical records.  Dizziness Jodi Bowman is a 60 y.o. female who presents to the Emergency Department complaining of syncope.  She presents to the ED accompanied by her husband for evaluation following a syncopal episode that occurred Friday morning.  Got out of bed and started to get dizzy/lightheaded and passed out while she was walking.  She had been up for about five minutes when she was passed out.  She was unconscious for an unclear amount of time.  She has ongoing sxs of lightheadedness and feels like she wall fall or pass out when she tries to walk.  Has pain to the back of her head.  She has pain to her lower back (has chronic back pain - similar to chronic pain but worse than usual).    No fever, cough, vomiting.  Has sob.  Has nausea - chronic. Has mild abdominal discomfort.  Has poor appetite.  No hematochezia or melena.  Last BM yesterday.      Past Medical History:  Diagnosis Date   Arthritis    Calculus of gallbladder without mention of cholecystitis or obstruction    Dizziness    Fibromyalgia    GERD (gastroesophageal reflux disease)    Goiter    Hypercholesteremia    Hypertension    Lower back pain    Migraine    Nontoxic uninodular goiter    sees dr vollmer at [employer]   Obesity    PONV (postoperative nausea and vomiting)    Sleep apnea    STOPBANG=5   Stroke [facility]) 2000    Patient Active Problem List   Diagnosis Date Noted   Anasarca 07/13/2021   COVID-19 virus infection 07/13/2021   Dysphagia    Mood disorder ([place]) 03/12/2021   Weakness 08/27/2020   Mental status change resolved 08/27/2020   Weakness of left side of body 06/29/2020   ACS (acute coronary syndrome)  ([place]) 08/25/2019   Chest pain 08/25/2019   Postoperative seroma involving nervous system after nervous system procedure 04/05/2019   Drainage from wound 04/02/2019   Infective otitis externa of left ear    Left otitis media    Urinary retention    Orthostasis    Acute blood loss anemia    Fibromyalgia    Chronic pain syndrome    Lumbar radiculopathy 03/08/2019   Orthostatic hypotension 03/06/2019   Postoperative urinary retention 03/06/2019   Lumbar foraminal stenosis 03/04/2019   S/P exploratory laparotomy 03/06/2018   Bowel obstruction ([place]) 03/06/2018   Chest pain, rule out acute myocardial infarction 12/17/2017   Hypokalemia 12/17/2017   Acute lower UTI 12/17/2017   Vertigo 04/30/2017   Small vessel disease, cerebrovascular 04/30/2017   Chronic low back pain 08/01/2015   Right hip pain 08/01/2015   Abnormality of gait 08/01/2015   Left leg weakness    Low back pain with radiation    Syncope 07/30/2014   Atypical chest pain 03/12/2014   Lap chole IOC April 2013 01/29/2012   Gallstones 12/04/2011   Thyroid nodule-non neoplastic goiter by needle aspiration 12/04/2011   GLUCOSE INTOLERANCE 03/12/2010   DYSLIPIDEMIA 03/12/2010   Chronic migraine 03/12/2010   Carotid stenosis 03/12/2010   CEREBROVASCULAR ACCIDENT  03/12/2010   LIPOMA 01/29/2010   HEADACHE 01/29/2010   ANKLE INJURY, RIGHT 04/12/2009   PHARYNGITIS 03/21/2009   Backache 03/01/2009   ALLERGIC RHINITIS 03/17/2007   LOW BACK PAIN 03/17/2007   Essential hypertension 01/01/2007   ANXIETY STATE NOS 09/11/2005    Past Surgical History:  Procedure Laterality Date   ABDOMINAL HYSTERECTOMY     BOWEL RESECTION N/A 03/06/2018   Procedure: SMALL BOWEL ANASTAMOSIS;  Surgeon: Clovis Riley, MD;  Location: [place];  Service: General;  Laterality: N/A;   CATARACT EXTRACTION W/ INTRAOCULAR LENS IMPLANT Bilateral    CESAREAN SECTION  yrs ago   done x 2   CHOLECYSTECTOMY  01/05/2012   Procedure: LAPAROSCOPIC  CHOLECYSTECTOMY WITH INTRAOPERATIVE CHOLANGIOGRAM;  Surgeon: Pedro Earls, MD;  Location: [facility];  Service: General;  Laterality: N/A;   COLONOSCOPY  10/08/2012   Procedure: COLONOSCOPY;  Surgeon: Beryle Beams, MD;  Location: [facility];  Service: Endoscopy;  Laterality: N/A;   KNEE ARTHROSCOPY  one 1995 and 1 in 1997   both knees done   LAPAROSCOPY N/A 03/06/2018   Procedure: LAPAROSCOPY DIAGNOSTIC WITH LYSIS OF ADHESIONS;  Surgeon: Clovis Riley, MD;  Location: [place];  Service: General;  Laterality: N/A;   LAPAROTOMY N/A 03/06/2018   Procedure: EXPLORATORY LAPAROTOMY, RESECTION OF DISTAL ROUX, CLOSURE OF INTERNAL HERNIA;  Surgeon: Clovis Riley, MD;  Location: [place];  Service: General;  Laterality: N/A;   LEFT HEART CATH AND CORONARY ANGIOGRAPHY N/A 08/29/2019   Procedure: LEFT HEART CATH AND CORONARY ANGIOGRAPHY;  Surgeon: Charolette Forward, MD;  Location: [place] [facility];  Service: Cardiovascular;  Laterality: N/A;   LUMBAR WOUND DEBRIDEMENT N/A 04/03/2019   Procedure: LUMBAR WOUND WASHOUT;  Surgeon: Judith Part, MD;  Location: [place];  Service: Neurosurgery;  Laterality: N/A;   POSTERIOR LUMBAR FUSION  03/04/2019   surgery for endometriosis  yrs ago   TEE WITHOUT CARDIOVERSION N/A 03/22/2021   Procedure: TRANSESOPHAGEAL ECHOCARDIOGRAM (TEE);  Surgeon: Skeet Latch, MD;  Location[facility];  Service: Cardiovascular;  Laterality: N/A;   thryoid biopsy  December 01, 2011    at [facility]     OB History   No obstetric history on file.     Family History  Problem Relation Age of Onset   Neuropathy Mother    Diabetes Mother    Hypertension Mother    Transient ischemic attack Mother    Seizures Mother    Dementia Mother    Other Father        MVA   Neuropathy Sister    Neuropathy Brother    Cancer Brother        colon and lung   Cancer Maternal Grandmother        colon    Social History   Tobacco Use   Smoking status: Never   Smokeless tobacco: Never  Vaping  Use   Vaping Use: Never used  Substance Use Topics   Alcohol use: Not Currently    Alcohol/week: 0.0 standard drinks   Drug use: No    Home Medications Prior to Admission medications   Medication Sig Start Date End Date Taking? Authorizing Provider  acetaminophen (TYLENOL) 500 MG tablet Take 500-1,000 mg by mouth every 6 (six) hours as needed for moderate pain or headache.   Yes [provider]  albuterol (VENTOLIN HFA) 108 (90 Base) MCG/ACT inhaler Inhale 1-2 puffs into the lungs every 6 (six) hours as needed for wheezing or shortness of breath.  Yes [provider]  aspirin EC 81 MG tablet Take 81 mg by mouth daily.   Yes [provider]  D3-50 1.25 MG (50000 UT) capsule Take 50,000 Units by mouth every Monday, Wednesday, and Friday. 05/06/20  Yes [provider]  diclofenac Sodium (VOLTAREN) 1 % GEL Apply 4 g topically 2 (two) times daily as needed (back pain).   Yes [provider]  DULoxetine (CYMBALTA) 60 MG capsule Take 60 mg by mouth daily. 08/11/19  Yes [provider]  escitalopram (LEXAPRO) 10 MG tablet Take 10 mg by mouth at bedtime. 05/04/20  Yes [provider]  ferrous sulfate 325 (65 FE) MG EC tablet Take 325 mg by mouth daily. 07/26/19  Yes [provider]  fluticasone (FLONASE) 50 MCG/ACT nasal spray Place 1 spray into both nostrils daily as needed for allergies.  11/08/17  Yes [provider]  folic acid (FOLVITE) 1 MG tablet Take 1 mg by mouth daily. 10/14/17  Yes [provider]  HYDROcodone-acetaminophen (NORCO) 10-325 MG tablet Take 1 tablet by mouth 2 (two) times daily as needed for moderate pain or severe pain. 02/16/20  Yes [provider]  loratadine (CLARITIN) 10 MG tablet Take 10 mg by mouth daily.    Yes [provider]  losartan (COZAAR) 50 MG tablet Take 50 mg by mouth daily. 05/17/21  Yes [provider]  Magnesium Oxide 400 MG CAPS Take 1 capsule (400  mg total) by mouth daily. 09/28/19  Yes Carlisle Cater, PA-C  metoprolol succinate (TOPROL-XL) 100 MG 24 hr tablet Take 100 mg by mouth at bedtime.  07/10/19  Yes [provider]  mirtazapine (REMERON) 7.5 MG tablet Take 7.5 mg by mouth at bedtime. 07/30/20  Yes [provider]  morphine (MS CONTIN) 15 MG 12 hr tablet Take 15 mg by mouth in the morning and at bedtime. 06/12/21  Yes [provider]  ondansetron (ZOFRAN-ODT) 4 MG disintegrating tablet Take 4 mg by mouth every 8 (eight) hours as needed for nausea/vomiting. 06/07/20  Yes [provider]  pantoprazole (PROTONIX) 40 MG tablet Take 1 tablet (40 mg total) by mouth 2 (two) times daily. 03/15/19  Yes Angiulli, Lavon Paganini, PA-C  polyethylene glycol (MIRALAX / GLYCOLAX) 17 g packet Take 17 g by mouth daily as needed for mild constipation. 03/08/19  Yes Viona Gilmore D, NP  spironolactone (ALDACTONE) 25 MG tablet Take 1 tablet (25 mg total) by mouth daily. 04/29/21 07/13/21 Yes Deno Etienne, DO  APIXABAN Arne [place]) VTE STARTER PACK (10MG  AND 5MG ) Take as directed on package: start with two-5mg  tablets twice daily for 7 days. On day 8, switch to one-5mg  tablet twice daily. Patient not taking: No sig reported 04/15/21   Pattricia Boss, MD  clindamycin (CLEOCIN) 300 MG capsule Take 1 capsule (300 mg total) by mouth 4 (four) times daily. X 7 days Patient not taking: No sig reported 04/29/21   Deno Etienne, DO  erythromycin ophthalmic ointment Place a 1/2 inch ribbon of ointment into the lower eyelid four times a day for 7 days Patient not taking: No sig reported 04/29/21   Deno Etienne, DO  furosemide (LASIX) 40 MG tablet Take 40 mg by mouth See admin instructions. Qd x 5 days Patient not taking: Reported on 07/13/2021 06/12/21   [provider]  isosorbide mononitrate (IMDUR) 30 MG 24 hr tablet Take 1 tablet (30 mg total) by mouth daily. Patient not taking: No sig reported 11/18/19   Corena Herter, PA-C  KLOR-CON M20 20 MEQ  tablet Take 20 mEq by mouth See admin instructions. Qd x 10 days Patient not taking: Reported on 07/13/2021 06/12/21   [provider]  pregabalin (LYRICA) 75 MG capsule Take 1 capsule (75 mg total) by mouth 3 (three) times daily. Patient not taking: No sig reported 03/15/19   Angiulli, Lavon Paganini, PA-C  metoprolol (LOPRESSOR) 50 MG tablet Take 50 mg by mouth 2 (two) times daily.    12/04/11  [provider]  simvastatin (ZOCOR) 20 MG tablet Take 20 mg by mouth at bedtime.    12/04/11  [provider]    Allergies    Shrimp [shellfish allergy], Naproxen, and Shellfish-derived products  Review of Systems   Review of Systems  Neurological:  Positive for dizziness.  All other systems reviewed and are negative.  Physical Exam Updated Vital Signs BP (S) 133/89 Comment: sitting  Pulse 93   Temp 98.3 F (36.8 C) (Oral)   Resp 13   Ht 4\' 11"  (1.499 m)   Wt 51.3 kg   SpO2 100%   BMI 22.84 kg/m   Physical Exam Vitals and nursing note reviewed.  Constitutional:      Appearance: She is well-developed.  HENT:     Head: Normocephalic and atraumatic.  Cardiovascular:     Rate and Rhythm: Normal rate and regular rhythm.     Heart sounds: No murmur heard. Pulmonary:     Effort: Pulmonary effort is normal. No respiratory distress.     Breath sounds: Normal breath sounds.  Abdominal:     Palpations: Abdomen is soft.     Tenderness: There is no abdominal tenderness. There is no guarding or rebound.  Musculoskeletal:        General: Swelling present. No tenderness.     Comments: 2+ pitting edema to BLE.  2+ femoral pulses bilaterally.  Faint DP pulses bilaterally.    Skin:    General: Skin is warm and dry.  Neurological:     Mental Status: She is alert and oriented to person, place, and time.     Comments: Significant generalized weakness  Psychiatric:        Behavior: Behavior normal.    ED Results / Procedures / Treatments   Labs (all labs ordered are listed,  but only abnormal results are displayed) Labs Reviewed  RESP PANEL BY RT-PCR (FLU A&B, COVID) ARPGX2 - Abnormal; Notable for the following components:      Result Value   SARS Coronavirus 2 by RT PCR POSITIVE (*)    All other components within normal limits  BASIC METABOLIC PANEL - Abnormal; Notable for the following components:   Potassium 2.7 (*)    Calcium 7.4 (*)    All other components within normal limits  CBC WITH DIFFERENTIAL/PLATELET - Abnormal; Notable for the following components:   RBC 2.68 (*)    Hemoglobin 8.7 (*)    HCT 26.3 (*)    RDW 18.7 (*)    nRBC 0.3 (*)    All other components within normal limits  MAGNESIUM - Abnormal; Notable for the following components:   Magnesium 1.3 (*)    All other components within normal limits  HEPATIC FUNCTION PANEL - Abnormal; Notable for the following components:   Total Protein 4.8 (*)    Albumin 1.6 (*)    Total Bilirubin 2.9 (*)    Bilirubin, Direct 1.5 (*)    Indirect Bilirubin 1.4 (*)    All other components within normal limits  BRAIN NATRIURETIC PEPTIDE  URINALYSIS, COMPLETE (UACMP) WITH MICROSCOPIC  PROTIME-INR  TSH  PREALBUMIN  HIV ANTIBODY (ROUTINE TESTING W REFLEX)    EKG EKG Interpretation  Date/Time:  Friday July 12 2021 20:46:50 EDT Ventricular Rate:  90 PR Interval:  116 QRS Duration: 64 QT Interval:  324 QTC Calculation: 396 R Axis:   21 Text Interpretation: Normal sinus rhythm Low voltage QRS Cannot rule out Anterior infarct , age undetermined Abnormal ECG Confirmed by Quintella Reichert [id number]) on 07/06/2021 11:58:47 PM  Radiology CT Head Wo Contrast  Result Date: 07/13/2021 CLINICAL DATA:  Head/neck trauma EXAM: CT HEAD WITHOUT CONTRAST CT CERVICAL SPINE WITHOUT CONTRAST TECHNIQUE: Multidetector CT imaging of the head and cervical spine was performed following the standard protocol without intravenous contrast. Multiplanar CT image reconstructions of the cervical spine were also generated.  COMPARISON:  03/27/2021 FINDINGS: CT HEAD FINDINGS Brain: No evidence of acute infarction, hemorrhage, hydrocephalus, extra-axial collection or mass lesion/mass effect. Mild subcortical white matter and periventricular small vessel ischemic changes. Vascular: No hyperdense vessel or unexpected calcification. Skull: Normal. Negative for fracture or focal lesion. Sinuses/Orbits: The visualized paranasal sinuses are essentially clear. The mastoid air cells are unopacified. Other: None. CT CERVICAL SPINE FINDINGS Alignment: Normal cervical lordosis. Skull base and vertebrae: No acute fracture. No primary bone lesion or focal pathologic process. Soft tissues and spinal canal: No prevertebral fluid or swelling. No visible canal hematoma. Disc levels: Mild degenerative changes of the mid cervical spine. Spinal canal is patent Upper chest: 2.4 cm right thyroid nodule with coarse calcification (series 4/image 90), chronic. This was previously evaluated on remote thyroid biopsy. This has been evaluated on previous imaging. (ref: J Am Coll Radiol. 2015 Feb;12(2): 143-50). Other: Visualized lungs are notable for layering moderate right and trace left pleural effusions. IMPRESSION: No evidence of acute intracranial abnormality. Mild small vessel ischemic changes. No evidence of traumatic injury to the cervical spine. Mild degenerative changes. Layering moderate right and trace left pleural effusions. Electronically Signed   By: Julian Hy M.D.   On: 07/13/2021 00:34   CT Cervical Spine Wo Contrast  Result Date: 07/13/2021 CLINICAL DATA:  Head/neck trauma EXAM: CT HEAD WITHOUT CONTRAST CT CERVICAL SPINE WITHOUT CONTRAST TECHNIQUE: Multidetector CT imaging of the head and cervical spine was performed following the standard protocol without intravenous contrast. Multiplanar CT image reconstructions of the cervical spine were also generated. COMPARISON:  03/27/2021 FINDINGS: CT HEAD FINDINGS Brain: No evidence of acute  infarction, hemorrhage, hydrocephalus, extra-axial collection or mass lesion/mass effect. Mild subcortical white matter and periventricular small vessel ischemic changes. Vascular: No hyperdense vessel or unexpected calcification. Skull: Normal. Negative for fracture or focal lesion. Sinuses/Orbits: The visualized paranasal sinuses are essentially clear. The mastoid air cells are unopacified. Other: None. CT CERVICAL SPINE FINDINGS Alignment: Normal cervical lordosis. Skull base and vertebrae: No acute fracture. No primary bone lesion or focal pathologic process. Soft tissues and spinal canal: No prevertebral fluid or swelling. No visible canal hematoma. Disc levels: Mild degenerative changes of the mid cervical spine. Spinal canal is patent Upper chest: 2.4 cm right thyroid nodule with coarse calcification (series 4/image 90), chronic. This was previously evaluated on remote thyroid biopsy. This has been evaluated on previous imaging. (ref: J Am Coll Radiol. 2015 Feb;12(2): 143-50). Other: Visualized lungs are notable for layering moderate right and trace left pleural effusions. IMPRESSION: No evidence of acute intracranial abnormality. Mild small vessel ischemic changes. No evidence of traumatic injury to the cervical spine. Mild degenerative changes. Layering moderate  right and trace left pleural effusions. Electronically Signed   By: Julian Hy M.D.   On: 07/13/2021 00:34   DG Chest Portable 1 View  Result Date: 07/09/2021 CLINICAL DATA:  Shortness of breath and hypertension. EXAM: PORTABLE CHEST 1 VIEW COMPARISON:  April 29, 2021 FINDINGS: Mild atelectasis is seen within the right lung base. A small right pleural effusion is also seen. No pneumothorax is identified. The heart size and mediastinal contours are within normal limits. Radiopaque surgical clips are seen within the right upper quadrant. A spinal stimulator wire is seen with its distal tip overlying the T10 vertebral body. IMPRESSION: 1.  Mild right basilar atelectasis. 2. Small right pleural effusion. Electronically Signed   By: Virgina Norfolk M.D.   On: 07/07/2021 21:46    Procedures Procedures  CRITICAL CARE Performed by: Quintella Reichert   Total critical care time: 35 minutes  Critical care time was exclusive of separately billable procedures and treating other patients.  Critical care was necessary to treat or prevent imminent or life-threatening deterioration.  Critical care was time spent personally by me on the following activities: development of treatment plan with patient and/or surrogate as well as nursing, discussions with consultants, evaluation of patient's response to treatment, examination of patient, obtaining history from patient or surrogate, ordering and performing treatments and interventions, ordering and review of laboratory studies, ordering and review of radiographic studies, pulse oximetry and re-evaluation of patient's condition.  Medications Ordered in ED Medications  potassium chloride 10 mEq in 100 mL IVPB (10 mEq Intravenous New Bag/Given 07/13/21 0226)  potassium chloride SA (KLOR-CON) CR tablet 40 mEq (0 mEq Oral Hold 07/13/21 0226)  magnesium sulfate IVPB 2 g 50 mL (2 g Intravenous New Bag/Given 07/13/21 0255)  enoxaparin (LOVENOX) injection 40 mg (has no administration in time range)  acetaminophen (TYLENOL) tablet 650 mg (has no administration in time range)  ondansetron (ZOFRAN) tablet 4 mg (has no administration in time range)    Or  ondansetron (ZOFRAN) injection 4 mg (has no administration in time range)  nirmatrelvir/ritonavir EUA (renal dosing) (PAXLOVID) 2 tablet (has no administration in time range)  acetaminophen (TYLENOL) tablet 1,000 mg (1,000 mg Oral Given 06/30/2021 2129)  potassium chloride SA (KLOR-CON) CR tablet 40 mEq (40 mEq Oral Given 07/13/21 0131)    ED Course  I have reviewed the triage vital signs and the nursing notes.  Pertinent labs & imaging results that  were available during my care of the patient were reviewed by me and considered in my medical decision making (see chart for details).    MDM Rules/Calculators/A&P                         Patient here for evaluation of lightheadedness/dizziness, syncopal episode. She has significant lower extremity edema on evaluation. She has profound generalized weakness. Labs significant for hypokalemia - will replace.  No evidence of acute intracranial injury secondary to fall.  No evidence of acute infectious process.  Plan to admit for ongoing treatment and workup.     Final Clinical Impression(s) / ED Diagnoses Final diagnoses:  Hypokalemia  Syncope, unspecified syncope type    Rx / DC Orders ED Discharge Orders     None        Quintella Reichert, MD 07/13/21 [phone]

## 2021-07-13 NOTE — Progress Notes (Signed)
Subjective: Patient admitted this morning, see detailed H&P by Dr Alcario Drought 60 year old female with a history of OSA, hypertension, prior stroke, gastric bypass in 2017 came to hospital with worsening leg edema for the past several months.  CT abdominal scan in July was not impressive for liver abnormalities, 2D echo including TEE done earlier this year was negative for cardiac dysfunction.  She had history of gastric bypass surgery and has been having loose stools for months. Today she came to ED when she would have dizziness, generalized weakness, syncope on standing.  Also has been having shortness of breath for past 3 days. In the ED COVID-19 RT-PCR test came back positive.  Vitals:   07/13/21 0600 07/13/21 0700  BP: 118/83 114/78  Pulse:  (!) 122  Resp: 13 17  Temp:    SpO2: 93% 100%      A/P  COVID-19 infection -No oxygen requirement at this time -Denies shortness of breath -Started on Paxlovid  Anasarca -Patient has hypoalbuminemia, with albumin level 1.6, prealbumin 7.3 -She has hypoalbuminemia since beginning of 2020 -She had gastric bypass surgery in 2017 -Has been having loose stools -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 -She does have hypomagnesemia and hypokalemia -We will consult dietitian for nutritional counseling  Urinary retention -Patient is that she has been having intermittent urine retention for past few months -We will obtain renal ultrasound -Could also be medication induced, patient is on SSRI at home -Will insert Foley catheter -Correct electrolyte abnormalities -Consider voiding trial in 1 to 2 days  Syncope -Continue telemetry monitoring -Check orthostatic vital signs every 4 hours x3 -Had TEE earlier in June 2022, which showed EF of 60-65%, no wall motion abnormalities  Recent history of pulmonary embolism -Diagnosed in July of this year -We will continue with anticoagulation with apixaban   [place]  Hospitalist [phone]

## 2021-07-13 NOTE — ED Notes (Signed)
Cleaned pt, diaper soaked with urine. Pt able to turn on sides by self.

## 2021-07-13 NOTE — ED Notes (Signed)
RN at bedside,  pt sleeping, had removed pulse ox. RN placed new pulse ox, SpO2 on RA 86%. RN placed pt on 2L O2 via nasal cannula, SpO2 increased to 94%

## 2021-07-13 NOTE — ED Notes (Signed)
Breakfast Order Placed ?

## 2021-07-13 NOTE — ED Notes (Signed)
Pt unable to complete ortho static BP while standing. Pt became nauseous, HR in 140s, unable to hold herself up.

## 2021-07-13 NOTE — H&P (Signed)
History and Physical    Jodi Bowman:[id number] DOB: 01-Jan-1961 DOA: 06/30/2021  PCP: Aletha Halim., PA-C  Patient coming from: Home  I have personally briefly reviewed patient's old medical records in [place]  Chief Complaint: Generalized weakness  HPI: Jodi Bowman is a 60 y.o. female with medical history significant of OSA, HTN, prior stroke, CPS.  Pt with peripheral edema ongoing for several months, appears to be related to rather severe hypoalbuminemia, though etiology of hypoalbuminemia not quite clear.  Multiple UAs are unimpressive for proteinuria (certainly nothing nephrotic range), CT in July of [facility] wasn't impressive for any liver abnormalities (certainly not the advanced cirrhosis needed).  2d echo (including TEE) done earlier this year was neg for cardiac dysfunction that would explain her peripheral edema.  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  Has been having loose stools for months.  Today pt presents to ED with dizziness, generalized weakness, syncope on standing Friday morning.  SOB over past 3 days or so.  Today too weak to ambulate prompting them to come in to ED.  No melena, no hematochezia, no CP.   ED Course: Orthostatic vitals cant be obtained (but likely very positive as pt gets light headed and HR goes to 140 on trying to stand).  Initial work up unremarkable though UA and LFTs (including albumin) are still pending.  COVID has since come back positive.   Review of Systems: As per HPI, otherwise all review of systems negative.  Past Medical History:  Diagnosis Date   Arthritis    Calculus of gallbladder without mention of cholecystitis or obstruction    Dizziness    Fibromyalgia    GERD (gastroesophageal reflux disease)    Goiter    Hypercholesteremia    Hypertension    Lower back pain    Migraine    Nontoxic uninodular goiter    sees dr vollmer at [facility]   Obesity    PONV (postoperative nausea and vomiting)     Sleep apnea    STOPBANG=5   Stroke ([place]) 2000    Past Surgical History:  Procedure Laterality Date   ABDOMINAL HYSTERECTOMY     BOWEL RESECTION N/A 03/06/2018   Procedure: SMALL BOWEL ANASTAMOSIS;  Surgeon: Clovis Riley, MD;  Location: [facility];  Service: General;  Laterality: N/A;   CATARACT EXTRACTION W/ INTRAOCULAR LENS IMPLANT Bilateral    CESAREAN SECTION  yrs ago   done x 2   CHOLECYSTECTOMY  01/05/2012   Procedure: LAPAROSCOPIC CHOLECYSTECTOMY WITH INTRAOPERATIVE CHOLANGIOGRAM;  Surgeon: Pedro Earls, MD;  Location: [facility];  Service: General;  Laterality: N/A;   COLONOSCOPY  10/08/2012   Procedure: COLONOSCOPY;  Surgeon: Beryle Beams, MD;  Location: [facility];  Service: Endoscopy;  Laterality: N/A;   KNEE ARTHROSCOPY  one 1995 and 1 in 1997   both knees done   LAPAROSCOPY N/A 03/06/2018   Procedure: LAPAROSCOPY DIAGNOSTIC WITH LYSIS OF ADHESIONS;  Surgeon: Clovis Riley, MD;  Location: [facility];  Service: General;  Laterality: N/A;   LAPAROTOMY N/A 03/06/2018   Procedure: EXPLORATORY LAPAROTOMY, RESECTION OF DISTAL ROUX, CLOSURE OF INTERNAL HERNIA;  Surgeon: Clovis Riley, MD;  Location: [facility];  Service: General;  Laterality: N/A;   LEFT HEART CATH AND CORONARY ANGIOGRAPHY N/A 08/29/2019   Procedure: LEFT HEART CATH AND CORONARY ANGIOGRAPHY;  Surgeon: Charolette Forward, MD;  Location: [facility];  Service: Cardiovascular;  Laterality: N/A;   LUMBAR WOUND DEBRIDEMENT N/A 04/03/2019  Procedure: LUMBAR WOUND WASHOUT;  Surgeon: Judith Part, MD;  Location: [facility];  Service: Neurosurgery;  Laterality: N/A;   POSTERIOR LUMBAR FUSION  03/04/2019   surgery for endometriosis  yrs ago   TEE WITHOUT CARDIOVERSION N/A 03/22/2021   Procedure: TRANSESOPHAGEAL ECHOCARDIOGRAM (TEE);  Surgeon: Skeet Latch, MD;  Location: [facility];  Service: Cardiovascular;  Laterality: N/A;   thryoid biopsy  December 01, 2011    at [facility]     reports that she has never smoked. She has  never used smokeless tobacco. She reports that she does not currently use alcohol. She reports that she does not use drugs.  Allergies  Allergen Reactions   Shrimp [Shellfish Allergy] Anaphylaxis   Naproxen Other (See Comments)    Makes stomach cramp and burn badly   Shellfish-Derived Products     Other reaction(s): GI Upset (intolerance)    Family History  Problem Relation Age of Onset   Neuropathy Mother    Diabetes Mother    Hypertension Mother    Transient ischemic attack Mother    Seizures Mother    Dementia Mother    Other Father        MVA   Neuropathy Sister    Neuropathy Brother    Cancer Brother        colon and lung   Cancer Maternal Grandmother        colon     Prior to Admission medications   Medication Sig Start Date End Date Taking? Authorizing Provider  acetaminophen (TYLENOL) 500 MG tablet Take 500-1,000 mg by mouth every 6 (six) hours as needed for moderate pain or headache.   Yes [provider]  albuterol (VENTOLIN HFA) 108 (90 Base) MCG/ACT inhaler Inhale 1-2 puffs into the lungs every 6 (six) hours as needed for wheezing or shortness of breath.   Yes [provider]  aspirin EC 81 MG tablet Take 81 mg by mouth daily.   Yes [provider]  D3-50 1.25 MG (50000 UT) capsule Take 50,000 Units by mouth every Monday, Wednesday, and Friday. 05/06/20  Yes [provider]  diclofenac Sodium (VOLTAREN) 1 % GEL Apply 4 g topically 2 (two) times daily as needed (back pain).   Yes [provider]  DULoxetine (CYMBALTA) 60 MG capsule Take 60 mg by mouth daily. 08/11/19  Yes [provider]  escitalopram (LEXAPRO) 10 MG tablet Take 10 mg by mouth at bedtime. 05/04/20  Yes [provider]  ferrous sulfate 325 (65 FE) MG EC tablet Take 325 mg by mouth daily. 07/26/19  Yes [provider]  fluticasone (FLONASE) 50 MCG/ACT nasal spray Place 1 spray into both nostrils daily as needed for allergies.  11/08/17   Yes [provider]  folic acid (FOLVITE) 1 MG tablet Take 1 mg by mouth daily. 10/14/17  Yes [provider]  HYDROcodone-acetaminophen (NORCO) 10-325 MG tablet Take 1 tablet by mouth 2 (two) times daily as needed for moderate pain or severe pain. 02/16/20  Yes [provider]  loratadine (CLARITIN) 10 MG tablet Take 10 mg by mouth daily.    Yes [provider]  losartan (COZAAR) 50 MG tablet Take 50 mg by mouth daily. 05/17/21  Yes [provider]  Magnesium Oxide 400 MG CAPS Take 1 capsule (400 mg total) by mouth daily. 09/28/19  Yes Carlisle Cater, PA-C  metoprolol succinate (TOPROL-XL) 100 MG 24 hr tablet Take 100 mg by mouth at bedtime.  07/10/19  Yes [provider]  mirtazapine (REMERON) 7.5 MG tablet Take 7.5 mg by mouth at bedtime. 07/30/20  Yes [provider]  morphine (MS CONTIN) 15 MG 12 hr tablet Take 15 mg by mouth in the morning and at bedtime. 06/12/21  Yes [provider]  ondansetron (ZOFRAN-ODT) 4 MG disintegrating tablet Take 4 mg by mouth every 8 (eight) hours as needed for nausea/vomiting. 06/07/20  Yes [provider]  pantoprazole (PROTONIX) 40 MG tablet Take 1 tablet (40 mg total) by mouth 2 (two) times daily. 03/15/19  Yes Angiulli, Lavon Paganini, PA-C  polyethylene glycol (MIRALAX / GLYCOLAX) 17 g packet Take 17 g by mouth daily as needed for mild constipation. 03/08/19  Yes Viona Gilmore D, NP  spironolactone (ALDACTONE) 25 MG tablet Take 1 tablet (25 mg total) by mouth daily. 04/29/21 07/13/21 Yes Deno Etienne, DO  APIXABAN Arne Cleveland) VTE STARTER PACK (10MG  AND 5MG ) Take as directed on package: start with two-5mg  tablets twice daily for 7 days. On day 8, switch to one-5mg  tablet twice daily. Patient not taking: No sig reported 04/15/21   Pattricia Boss, MD  clindamycin (CLEOCIN) 300 MG capsule Take 1 capsule (300 mg total) by mouth 4 (four) times daily. X 7 days Patient not taking: No sig reported 04/29/21    Deno Etienne, DO  erythromycin ophthalmic ointment Place a 1/2 inch ribbon of ointment into the lower eyelid four times a day for 7 days Patient not taking: No sig reported 04/29/21   Deno Etienne, DO  furosemide (LASIX) 40 MG tablet Take 40 mg by mouth See admin instructions. Qd x 5 days Patient not taking: Reported on 07/13/2021 06/12/21   [provider]  isosorbide mononitrate (IMDUR) 30 MG 24 hr tablet Take 1 tablet (30 mg total) by mouth daily. Patient not taking: No sig reported 11/18/19   Krista Blue L, PA-C  KLOR-CON M20 20 MEQ tablet Take 20 mEq by mouth See admin instructions. Qd x 10 days Patient not taking: Reported on 07/13/2021 06/12/21   [provider]  pregabalin (LYRICA) 75 MG capsule Take 1 capsule (75 mg total) by mouth 3 (three) times daily. Patient not taking: No sig reported 03/15/19   Angiulli, Lavon Paganini, PA-C  metoprolol (LOPRESSOR) 50 MG tablet Take 50 mg by mouth 2 (two) times daily.    12/04/11  [provider]  simvastatin (ZOCOR) 20 MG tablet Take 20 mg by mouth at bedtime.    12/04/11  [provider]    Physical Exam: Vitals:   07/13/21 0045 07/13/21 0100 07/13/21 0156 07/13/21 0200  BP: 135/82 (!) 146/82 (S) 140/89 (S) 133/89  Pulse: 82 80  93  Resp: 11 20 13    Temp:      TempSrc:      SpO2: 99% 99% 100%   Weight:      Height:        Constitutional: NAD, calm, comfortable Eyes: PERRL, lids and conjunctivae normal ENMT: Mucous membranes are moist. Posterior pharynx clear of any exudate or lesions.Normal dentition.  Neck: normal, supple, no masses, no thyromegaly Respiratory: clear to auscultation bilaterally, no wheezing, no crackles. Normal respiratory effort. No accessory muscle use.  Cardiovascular: Regular rate and rhythm, no murmurs / rubs / gallops. 2+ BLE edema. 2+ pedal pulses. No carotid bruits.  Abdomen: no tenderness, no masses palpated. No hepatosplenomegaly. Bowel sounds positive.  Musculoskeletal: no clubbing /  cyanosis. No joint deformity upper and lower extremities. Good ROM, no contractures. Normal muscle tone.  Skin: no rashes, lesions, ulcers. No  induration Neurologic: CN 2-12 grossly intact. Sensation intact, DTR normal. Strength 5/5 in all 4.  Psychiatric: Normal judgment and insight. Alert and oriented x 3. Normal mood.    Labs on Admission: I have personally reviewed following labs and imaging studies  CBC: Recent Labs  Lab 07/09/2021 2058  WBC 7.5  NEUTROABS 5.3  HGB 8.7*  HCT 26.3*  MCV 98.1  PLT 161   Basic Metabolic Panel: Recent Labs  Lab 06/30/2021 2058 07/13/21 0120  NA 138  --   K 2.7*  --   CL 105  --   CO2 27  --   GLUCOSE 89  --   BUN 12  --   CREATININE 0.91  --   CALCIUM 7.4*  --   MG  --  1.3*   GFR: Estimated Creatinine Clearance: 44.3 mL/min (by C-G formula based on SCr of 0.91 mg/dL). Liver Function Tests: No results for input(s): AST, ALT, ALKPHOS, BILITOT, PROT, ALBUMIN in the last 168 hours. No results for input(s): LIPASE, AMYLASE in the last 168 hours. No results for input(s): AMMONIA in the last 168 hours. Coagulation Profile: No results for input(s): INR, PROTIME in the last 168 hours. Cardiac Enzymes: No results for input(s): CKTOTAL, CKMB, CKMBINDEX, TROPONINI in the last 168 hours. BNP (last 3 results) No results for input(s): PROBNP in the last 8760 hours. HbA1C: No results for input(s): HGBA1C in the last 72 hours. CBG: No results for input(s): GLUCAP in the last 168 hours. Lipid Profile: No results for input(s): CHOL, HDL, LDLCALC, TRIG, CHOLHDL, LDLDIRECT in the last 72 hours. Thyroid Function Tests: No results for input(s): TSH, T4TOTAL, FREET4, T3FREE, THYROIDAB in the last 72 hours. Anemia Panel: No results for input(s): VITAMINB12, FOLATE, FERRITIN, TIBC, IRON, RETICCTPCT in the last 72 hours. Urine analysis:    Component Value Date/Time   COLORURINE YELLOW 03/27/2021 1421   APPEARANCEUR CLEAR 03/27/2021 1421   LABSPEC  1.029 03/27/2021 1421   PHURINE 5.0 03/27/2021 1421   GLUCOSEU NEGATIVE 03/27/2021 1421   HGBUR NEGATIVE 03/27/2021 1421   HGBUR negative 03/01/2009 0853   BILIRUBINUR NEGATIVE 03/27/2021 1421   KETONESUR NEGATIVE 03/27/2021 1421   PROTEINUR NEGATIVE 03/27/2021 1421   UROBILINOGEN 1.0 10/09/2014 2018   NITRITE NEGATIVE 03/27/2021 1421   LEUKOCYTESUR NEGATIVE 03/27/2021 1421    Radiological Exams on Admission: CT Head Wo Contrast  Result Date: 07/13/2021 CLINICAL DATA:  Head/neck trauma EXAM: CT HEAD WITHOUT CONTRAST CT CERVICAL SPINE WITHOUT CONTRAST TECHNIQUE: Multidetector CT imaging of the head and cervical spine was performed following the standard protocol without intravenous contrast. Multiplanar CT image reconstructions of the cervical spine were also generated. COMPARISON:  03/27/2021 FINDINGS: CT HEAD FINDINGS Brain: No evidence of acute infarction, hemorrhage, hydrocephalus, extra-axial collection or mass lesion/mass effect. Mild subcortical white matter and periventricular small vessel ischemic changes. Vascular: No hyperdense vessel or unexpected calcification. Skull: Normal. Negative for fracture or focal lesion. Sinuses/Orbits: The visualized paranasal sinuses are essentially clear. The mastoid air cells are unopacified. Other: None. CT CERVICAL SPINE FINDINGS Alignment: Normal cervical lordosis. Skull base and vertebrae: No acute fracture. No primary bone lesion or focal pathologic process. Soft tissues and spinal canal: No prevertebral fluid or swelling. No visible canal hematoma. Disc levels: Mild degenerative changes of the mid cervical spine. Spinal canal is patent Upper chest: 2.4 cm right thyroid nodule with coarse calcification (series 4/image 90), chronic. This was previously evaluated on remote thyroid biopsy. This has been evaluated on previous imaging. (ref: J Am Coll  Radiol. 2015 Feb;12(2): 143-50). Other: Visualized lungs are notable for layering moderate right and trace  left pleural effusions. IMPRESSION: No evidence of acute intracranial abnormality. Mild small vessel ischemic changes. No evidence of traumatic injury to the cervical spine. Mild degenerative changes. Layering moderate right and trace left pleural effusions. Electronically Signed   By: Julian Hy M.D.   On: 07/13/2021 00:34   CT Cervical Spine Wo Contrast  Result Date: 07/13/2021 CLINICAL DATA:  Head/neck trauma EXAM: CT HEAD WITHOUT CONTRAST CT CERVICAL SPINE WITHOUT CONTRAST TECHNIQUE: Multidetector CT imaging of the head and cervical spine was performed following the standard protocol without intravenous contrast. Multiplanar CT image reconstructions of the cervical spine were also generated. COMPARISON:  03/27/2021 FINDINGS: CT HEAD FINDINGS Brain: No evidence of acute infarction, hemorrhage, hydrocephalus, extra-axial collection or mass lesion/mass effect. Mild subcortical white matter and periventricular small vessel ischemic changes. Vascular: No hyperdense vessel or unexpected calcification. Skull: Normal. Negative for fracture or focal lesion. Sinuses/Orbits: The visualized paranasal sinuses are essentially clear. The mastoid air cells are unopacified. Other: None. CT CERVICAL SPINE FINDINGS Alignment: Normal cervical lordosis. Skull base and vertebrae: No acute fracture. No primary bone lesion or focal pathologic process. Soft tissues and spinal canal: No prevertebral fluid or swelling. No visible canal hematoma. Disc levels: Mild degenerative changes of the mid cervical spine. Spinal canal is patent Upper chest: 2.4 cm right thyroid nodule with coarse calcification (series 4/image 90), chronic. This was previously evaluated on remote thyroid biopsy. This has been evaluated on previous imaging. (ref: J Am Coll Radiol. 2015 Feb;12(2): 143-50). Other: Visualized lungs are notable for layering moderate right and trace left pleural effusions. IMPRESSION: No evidence of acute intracranial  abnormality. Mild small vessel ischemic changes. No evidence of traumatic injury to the cervical spine. Mild degenerative changes. Layering moderate right and trace left pleural effusions. Electronically Signed   By: Julian Hy M.D.   On: 07/13/2021 00:34   DG Chest Portable 1 View  Result Date: 07/11/2021 CLINICAL DATA:  Shortness of breath and hypertension. EXAM: PORTABLE CHEST 1 VIEW COMPARISON:  April 29, 2021 FINDINGS: Mild atelectasis is seen within the right lung base. A small right pleural effusion is also seen. No pneumothorax is identified. The heart size and mediastinal contours are within normal limits. Radiopaque surgical clips are seen within the right upper quadrant. A spinal stimulator wire is seen with its distal tip overlying the T10 vertebral body. IMPRESSION: 1. Mild right basilar atelectasis. 2. Small right pleural effusion. Electronically Signed   By: Virgina Norfolk M.D.   On: 07/09/2021 21:46    EKG: Independently reviewed.  Assessment/Plan Principal Problem:   COVID-19 virus infection Active Problems:   Anasarca   At this point, think pt has 2 independent illnesses going on: more acutely pt has COVID-19, more chronic over past months is what ever is causing her severe hypoalbuminemia, anasarca. COVID-19 - No O2 requirement at this point COVID pathway Paxlovid Daily labs Anasarca - Due to low albumin Not clear why albumin is low: UA pending, but prior UAs throughout the past few months have not show significant proteinuria (certainly nothing in the nephrotic range). LFTs pending, but no known h/o cirrhosis, no cirrhotic morphology on CT scan in July. ? Malnutrition as cause, pt does have ho gastric bypass surgery Check prealbumin level ? Other GI disease as cause of loss of albumin (ie celiac). May want to get follow up with GI for further work up here. TSH pending Try giving IV  albumin when lab come back confirming low albumin today. Syncope - Likely  related to COVID Tele monitor Has had recent echo earlier this year. Unimpressive (15% in 2 arteries) LHC in Nov 2020 Hypokalemia, hypomagnesemia - Replace Anemia - Monitor daily CBC  DVT prophylaxis: Lovenox Code Status: Full Family Communication: Husband at bedside Disposition Plan: Home after generalized weakness improved Consults called: None Admission status: Place in 74    Talajah Slimp, [place]  How to contact the [facility] Attending or Consulting provider [place] or covering provider during after hours [place], for this patient?  Check the care team in [facility] and look for a) attending/consulting [facility] provider listed and b) the [facility] team listed Log into [email or web address]  Amion Physician Scheduling and messaging for groups and whole hospitals  On call and physician scheduling software for group practices, residents, hospitalists and other medical providers for call, clinic, rotation and shift schedules. On[employer] is a hospital-wide system for scheduling doctors and paging doctors on call. EasyPlot is for scientific plotting and data analysis.  [email or web address]  and use [place]'s universal password to access. If you do not have the password, please contact the hospital operator.  Locate the [facility] provider you are looking for under [facility] and page to a number that you can be directly reached. If you still have difficulty reaching the provider, please page the [facility] (Director on Call) for the Hospitalists listed on amion for assistance.  07/13/2021, 3:08 AM

## 2021-07-14 ENCOUNTER — Observation Stay (HOSPITAL_COMMUNITY): Payer: Medicare (Managed Care)

## 2021-07-14 ENCOUNTER — Inpatient Hospital Stay (HOSPITAL_COMMUNITY): Payer: Medicare (Managed Care)

## 2021-07-14 DIAGNOSIS — E876 Hypokalemia: Secondary | ICD-10-CM | POA: Diagnosis present

## 2021-07-14 DIAGNOSIS — E871 Hypo-osmolality and hyponatremia: Secondary | ICD-10-CM | POA: Diagnosis not present

## 2021-07-14 DIAGNOSIS — E8809 Other disorders of plasma-protein metabolism, not elsewhere classified: Secondary | ICD-10-CM | POA: Diagnosis present

## 2021-07-14 DIAGNOSIS — A4102 Sepsis due to Methicillin resistant Staphylococcus aureus: Secondary | ICD-10-CM | POA: Diagnosis not present

## 2021-07-14 DIAGNOSIS — G928 Other toxic encephalopathy: Secondary | ICD-10-CM | POA: Diagnosis not present

## 2021-07-14 DIAGNOSIS — E43 Unspecified severe protein-calorie malnutrition: Secondary | ICD-10-CM | POA: Diagnosis present

## 2021-07-14 DIAGNOSIS — J9601 Acute respiratory failure with hypoxia: Secondary | ICD-10-CM | POA: Diagnosis not present

## 2021-07-14 DIAGNOSIS — A400 Sepsis due to streptococcus, group A: Secondary | ICD-10-CM | POA: Diagnosis not present

## 2021-07-14 DIAGNOSIS — R6521 Severe sepsis with septic shock: Secondary | ICD-10-CM | POA: Diagnosis not present

## 2021-07-14 DIAGNOSIS — R55 Syncope and collapse: Secondary | ICD-10-CM

## 2021-07-14 DIAGNOSIS — R609 Edema, unspecified: Secondary | ICD-10-CM | POA: Diagnosis not present

## 2021-07-14 DIAGNOSIS — E7800 Pure hypercholesterolemia, unspecified: Secondary | ICD-10-CM | POA: Diagnosis present

## 2021-07-14 DIAGNOSIS — R339 Retention of urine, unspecified: Secondary | ICD-10-CM | POA: Diagnosis not present

## 2021-07-14 DIAGNOSIS — K912 Postsurgical malabsorption, not elsewhere classified: Secondary | ICD-10-CM | POA: Diagnosis present

## 2021-07-14 DIAGNOSIS — M79604 Pain in right leg: Secondary | ICD-10-CM | POA: Diagnosis not present

## 2021-07-14 DIAGNOSIS — R601 Generalized edema: Secondary | ICD-10-CM | POA: Diagnosis not present

## 2021-07-14 DIAGNOSIS — R627 Adult failure to thrive: Secondary | ICD-10-CM | POA: Diagnosis present

## 2021-07-14 DIAGNOSIS — I10 Essential (primary) hypertension: Secondary | ICD-10-CM | POA: Diagnosis present

## 2021-07-14 DIAGNOSIS — U071 COVID-19: Secondary | ICD-10-CM | POA: Diagnosis not present

## 2021-07-14 DIAGNOSIS — E162 Hypoglycemia, unspecified: Secondary | ICD-10-CM | POA: Diagnosis not present

## 2021-07-14 DIAGNOSIS — Z66 Do not resuscitate: Secondary | ICD-10-CM | POA: Diagnosis not present

## 2021-07-14 DIAGNOSIS — T82514A Breakdown (mechanical) of infusion catheter, initial encounter: Secondary | ICD-10-CM | POA: Diagnosis not present

## 2021-07-14 DIAGNOSIS — D649 Anemia, unspecified: Secondary | ICD-10-CM | POA: Diagnosis present

## 2021-07-14 DIAGNOSIS — Z515 Encounter for palliative care: Secondary | ICD-10-CM | POA: Diagnosis not present

## 2021-07-14 DIAGNOSIS — G931 Anoxic brain damage, not elsewhere classified: Secondary | ICD-10-CM | POA: Diagnosis not present

## 2021-07-14 DIAGNOSIS — I69354 Hemiplegia and hemiparesis following cerebral infarction affecting left non-dominant side: Secondary | ICD-10-CM | POA: Diagnosis not present

## 2021-07-14 DIAGNOSIS — I3139 Other pericardial effusion (noninflammatory): Secondary | ICD-10-CM | POA: Diagnosis not present

## 2021-07-14 LAB — CBC WITH DIFFERENTIAL/PLATELET
Abs Immature Granulocytes: 0.2 10*3/uL — ABNORMAL HIGH (ref 0.00–0.07)
Basophils Absolute: 0 10*3/uL (ref 0.0–0.1)
Basophils Relative: 0 %
Eosinophils Absolute: 0.3 10*3/uL (ref 0.0–0.5)
Eosinophils Relative: 3 %
HCT: 25.6 % — ABNORMAL LOW (ref 36.0–46.0)
Hemoglobin: 8.7 g/dL — ABNORMAL LOW (ref 12.0–15.0)
Lymphocytes Relative: 0 %
Lymphs Abs: 0 10*3/uL — ABNORMAL LOW (ref 0.7–4.0)
MCH: 32.6 pg (ref 26.0–34.0)
MCHC: 34 g/dL (ref 30.0–36.0)
MCV: 95.9 fL (ref 80.0–100.0)
Metamyelocytes Relative: 2 %
Monocytes Absolute: 0.4 10*3/uL (ref 0.1–1.0)
Monocytes Relative: 4 %
Neutro Abs: 9.8 10*3/uL — ABNORMAL HIGH (ref 1.7–7.7)
Neutrophils Relative %: 91 %
Platelets: 277 10*3/uL (ref 150–400)
RBC: 2.67 MIL/uL — ABNORMAL LOW (ref 3.87–5.11)
RDW: 19.8 % — ABNORMAL HIGH (ref 11.5–15.5)
WBC: 10.8 10*3/uL — ABNORMAL HIGH (ref 4.0–10.5)
nRBC: 0.2 % (ref 0.0–0.2)
nRBC: 1 /100 WBC — ABNORMAL HIGH

## 2021-07-14 LAB — GLUCOSE, CAPILLARY
Glucose-Capillary: 60 mg/dL — ABNORMAL LOW (ref 70–99)
Glucose-Capillary: 94 mg/dL (ref 70–99)
Glucose-Capillary: 101 mg/dL — ABNORMAL HIGH (ref 70–99)
Glucose-Capillary: 101 mg/dL — ABNORMAL HIGH (ref 70–99)

## 2021-07-14 LAB — COMPREHENSIVE METABOLIC PANEL
ALT: 31 U/L (ref 0–44)
AST: 32 U/L (ref 15–41)
Albumin: <1.5 g/dL — ABNORMAL LOW (ref 3.5–5.0)
Alkaline Phosphatase: 65 U/L (ref 38–126)
Anion gap: 8 (ref 5–15)
BUN: 13 mg/dL (ref 8–23)
CO2: 25 mmol/L (ref 22–32)
Calcium: 7.5 mg/dL — ABNORMAL LOW (ref 8.9–10.3)
Chloride: 106 mmol/L (ref 98–111)
Creatinine, Ser: 0.85 mg/dL (ref 0.44–1.00)
GFR, Estimated: >60 mL/min (ref >60)
Glucose, Bld: 81 mg/dL (ref 70–99)
Potassium: 3.5 mmol/L (ref 3.5–5.1)
Sodium: 139 mmol/L (ref 135–145)
Total Bilirubin: 1.5 mg/dL — ABNORMAL HIGH (ref 0.3–1.2)
Total Protein: 4.4 g/dL — ABNORMAL LOW (ref 6.5–8.1)

## 2021-07-14 LAB — D-DIMER, QUANTITATIVE: D-Dimer, Quant: 0.93 ug/mL-FEU — ABNORMAL HIGH (ref 0.00–0.50)

## 2021-07-14 LAB — C-REACTIVE PROTEIN: CRP: 16 mg/dL — ABNORMAL HIGH (ref <1.0)

## 2021-07-14 LAB — MAGNESIUM: Magnesium: 1.8 mg/dL (ref 1.7–2.4)

## 2021-07-14 MED ORDER — METOPROLOL SUCCINATE ER 50 MG PO TB24
50.0000 mg | ORAL_TABLET | Freq: Every day | ORAL | Status: DC
  Administered 2021-07-14 – 2021-07-15 (×2): 50 mg via ORAL
  Filled 2021-07-14 (×2): qty 1

## 2021-07-14 MED ORDER — DEXTROSE-NACL 5-0.45 % IV SOLN: INTRAVENOUS | Status: AC

## 2021-07-14 MED ORDER — ALPRAZOLAM 0.5 MG PO TABS
0.5000 mg | ORAL_TABLET | Freq: Once | ORAL | Status: AC
  Administered 2021-07-14: 0.5 mg via ORAL
  Filled 2021-07-14: qty 1

## 2021-07-14 MED ORDER — DICLOFENAC SODIUM 1 % EX GEL
4.0000 g | Freq: Two times a day (BID) | CUTANEOUS | Status: DC | PRN
  Administered 2021-07-14 – 2021-07-15 (×2): 4 g via TOPICAL
  Filled 2021-07-14: qty 100

## 2021-07-14 MED ORDER — METHYLPREDNISOLONE SODIUM SUCC 125 MG IJ SOLR
60.0000 mg | Freq: Every day | INTRAMUSCULAR | Status: DC
  Administered 2021-07-14 – 2021-07-17 (×4): 60 mg via INTRAVENOUS
  Filled 2021-07-14 (×4): qty 2

## 2021-07-14 MED ORDER — ENSURE MAX PROTEIN PO LIQD
11.0000 [oz_av] | Freq: Every day | ORAL | Status: DC
  Administered 2021-07-14 – 2021-07-15 (×2): 11 [oz_av] via ORAL
  Filled 2021-07-14 (×2): qty 330

## 2021-07-14 MED ORDER — SODIUM CHLORIDE 0.9 % IV SOLN
100.0000 mg | Freq: Every day | INTRAVENOUS | Status: DC
  Administered 2021-07-15 – 2021-07-16 (×2): 100 mg via INTRAVENOUS
  Filled 2021-07-14 (×5): qty 20

## 2021-07-14 MED ORDER — SODIUM CHLORIDE 0.9 % IV SOLN
200.0000 mg | Freq: Once | INTRAVENOUS | Status: AC
  Administered 2021-07-14: 200 mg via INTRAVENOUS
  Filled 2021-07-14: qty 40

## 2021-07-14 NOTE — Progress Notes (Signed)
Overnight event  Notified by RN that after patient received Solu-Medrol in the afternoon, she has become increasingly more confused and hallucinating.  This evening about 30 minutes ago (around 8 PM) he noticed new left-sided facial droop and drooping of left eyelid.  Per nurse tech who had seen the patient yesterday, this is a new finding.  Patient does have a history of prior stroke. -Code stroke activated

## 2021-07-14 NOTE — Progress Notes (Signed)
[facility]  PROGRESS NOTE  ERETRIA MANTERNACH UXN:[id number] DOB: 1960-11-01 DOA: 07/05/2021 PCP: Aletha Halim., PA-C   Brief HPI:    60 year old female with a history of OSA, hypertension, prior stroke, gastric bypass in 2017 came to hospital with worsening leg edema for the past several months.  CT abdominal scan in July was not impressive for liver abnormalities, 2D echo including TEE done earlier this year was negative for cardiac dysfunction.  She had history of gastric bypass surgery and has been having loose stools for months. Today she came to ED when she would have dizziness, generalized weakness, syncope on standing.  Also has been having shortness of breath for past 3 days. In the ED COVID-19 RT-PCR test came back positive.   Subjective   Patient seen and examined, this morning complains shortness of breath.  Now requiring oxygen.  CRP elevated to 16.   Assessment/Plan:    COVID-19 infection -Patient was initially started on Paxlovid, which was later changed to molnupiravir, due to Paxlovid interaction with apixaban -Patient now developed shortness of breath, requiring 2 L/min of oxygen, CRP elevated at 16; D-dimer 0.93 -We will discontinue molnupiravir and start remdesivir per pharmacy consultation -Also start Solu-Medrol 60 mg IV every 12 hours -Follow CRP in a.m. 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 -She does have hypomagnesemia and hypokalemia -We will consult dietitian for nutritional counseling  Sinus tachycardia -Patient was taking metoprolol XL 100 mg daily at home -We will start Toprol-XL at lower dose of 50 mg daily  Urinary  retention -Patient is that she has been having intermittent urine retention for past few months -Renal ultrasound shows no hydronephrosis -Could also be medication induced, patient is on SSRI at home -Will insert Foley catheter -Correct electrolyte abnormalities -Consider voiding trial in 1 to 2 days   Syncope -Continue telemetry monitoring -Orthostatic vital signs every 4 hours x3 ordered, currently pending  -Had TEE earlier in June 2022, which showed EF of 60-65%, no wall motion abnormalities  Recent history of pulmonary embolism -Diagnosed in July of this year -We will continue with anticoagulation with apixaban  Scheduled medications:    apixaban  5 mg Oral BID   Chlorhexidine Gluconate Cloth  6 each Topical Daily   methylPREDNISolone (SOLU-MEDROL) injection  60 mg Intravenous Daily   metoprolol succinate  50 mg Oral Daily   potassium chloride  40 mEq Oral Once   Ensure Max Protein  11 oz Oral Daily     Data Reviewed:   CBG:  Recent Labs  Lab 07/27/2021 0806 07/25/2021 1214  GLUCAP 60* 94    SpO2: 94 % O2 Flow Rate (L/min): 2 L/min    Vitals:   07/28/2021 0446 07/11/2021 0449 07/10/2021 0807 07/02/2021 1212  BP:   105/71 119/83  Pulse: 96 99 100 100  Resp: 18 18 17 20   Temp:   98 F (36.7 C) 98.3 F (36.8 C)  TempSrc:   Oral Oral  SpO2: 95% 95% 94% 94%  Weight:      Height:         Intake/Output Summary (Last 24 hours) at 07/20/2021 1443 Last data filed at 07/22/2021 0156 Gross per 24 hour  Intake --  Output 375 ml  Net -  375 ml    10/14 1901 - 10/16 0700 In: 396.5  Out: 375 [Urine:375]  Filed Weights   06/29/2021 2039 07/13/21 2255  Weight: 51.3 kg 100.1 kg    Data Reviewed: Basic Metabolic Panel: Recent Labs  Lab 07/06/2021 2058 07/13/21 0120 07/10/2021 0132  NA 138  --  139  K 2.7*  --  3.5  CL 105  --  106  CO2 27  --  25  GLUCOSE 89  --  81  BUN 12  --  13  CREATININE 0.91  --  0.85  CALCIUM 7.4*  --  7.5*  MG  --  1.3* 1.8   Liver  Function Tests: Recent Labs  Lab 07/13/21 0120 07/13/2021 0132  AST 23 32  ALT 34 31  ALKPHOS 74 65  BILITOT 2.9* 1.5*  PROT 4.8* 4.4*  ALBUMIN 1.6* <1.5*   No results for input(s): LIPASE, AMYLASE in the last 168 hours. No results for input(s): AMMONIA in the last 168 hours. CBC: Recent Labs  Lab 07/15/2021 2058 07/20/2021 0132  WBC 7.5 10.8*  NEUTROABS 5.3 9.8*  HGB 8.7* 8.7*  HCT 26.3* 25.6*  MCV 98.1 95.9  PLT 282 277   Cardiac Enzymes: No results for input(s): CKTOTAL, CKMB, CKMBINDEX, TROPONINI in the last 168 hours. BNP (last 3 results) Recent Labs    04/29/21 1309 07/15/2021 2058  BNP 82.5 42.6    ProBNP (last 3 results) No results for input(s): PROBNP in the last 8760 hours.  CBG: Recent Labs  Lab 07/27/2021 0806 07/05/2021 1214  GLUCAP 60* 94       Radiology Reports  CT Head Wo Contrast  Result Date: 07/13/2021 CLINICAL DATA:  Head/neck trauma EXAM: CT HEAD WITHOUT CONTRAST CT CERVICAL SPINE WITHOUT CONTRAST TECHNIQUE: Multidetector CT imaging of the head and cervical spine was performed following the standard protocol without intravenous contrast. Multiplanar CT image reconstructions of the cervical spine were also generated. COMPARISON:  03/27/2021 FINDINGS: CT HEAD FINDINGS Brain: No evidence of acute infarction, hemorrhage, hydrocephalus, extra-axial collection or mass lesion/mass effect. Mild subcortical white matter and periventricular small vessel ischemic changes. Vascular: No hyperdense vessel or unexpected calcification. Skull: Normal. Negative for fracture or focal lesion. Sinuses/Orbits: The visualized paranasal sinuses are essentially clear. The mastoid air cells are unopacified. Other: None. CT CERVICAL SPINE FINDINGS Alignment: Normal cervical lordosis. Skull base and vertebrae: No acute fracture. No primary bone lesion or focal pathologic process. Soft tissues and spinal canal: No prevertebral fluid or swelling. No visible canal hematoma. Disc  levels: Mild degenerative changes of the mid cervical spine. Spinal canal is patent Upper chest: 2.4 cm right thyroid nodule with coarse calcification (series 4/image 90), chronic. This was previously evaluated on remote thyroid biopsy. This has been evaluated on previous imaging. (ref: J Am Coll Radiol. 2015 Feb;12(2): 143-50). Other: Visualized lungs are notable for layering moderate right and trace left pleural effusions. IMPRESSION: No evidence of acute intracranial abnormality. Mild small vessel ischemic changes. No evidence of traumatic injury to the cervical spine. Mild degenerative changes. Layering moderate right and trace left pleural effusions. Electronically Signed   By: Julian Hy M.D.   On: 07/13/2021 00:34   CT Cervical Spine Wo Contrast  Result Date: 07/13/2021 CLINICAL DATA:  Head/neck trauma EXAM: CT HEAD WITHOUT CONTRAST CT CERVICAL SPINE WITHOUT CONTRAST TECHNIQUE: Multidetector CT imaging of the head and cervical spine was performed following the standard protocol without intravenous contrast. Multiplanar CT image reconstructions of the cervical spine were also generated.  COMPARISON:  03/27/2021 FINDINGS: CT HEAD FINDINGS Brain: No evidence of acute infarction, hemorrhage, hydrocephalus, extra-axial collection or mass lesion/mass effect. Mild subcortical white matter and periventricular small vessel ischemic changes. Vascular: No hyperdense vessel or unexpected calcification. Skull: Normal. Negative for fracture or focal lesion. Sinuses/Orbits: The visualized paranasal sinuses are essentially clear. The mastoid air cells are unopacified. Other: None. CT CERVICAL SPINE FINDINGS Alignment: Normal cervical lordosis. Skull base and vertebrae: No acute fracture. No primary bone lesion or focal pathologic process. Soft tissues and spinal canal: No prevertebral fluid or swelling. No visible canal hematoma. Disc levels: Mild degenerative changes of the mid cervical spine. Spinal canal is  patent Upper chest: 2.4 cm right thyroid nodule with coarse calcification (series 4/image 90), chronic. This was previously evaluated on remote thyroid biopsy. This has been evaluated on previous imaging. (ref: J Am Coll Radiol. 2015 Feb;12(2): 143-50). Other: Visualized lungs are notable for layering moderate right and trace left pleural effusions. IMPRESSION: No evidence of acute intracranial abnormality. Mild small vessel ischemic changes. No evidence of traumatic injury to the cervical spine. Mild degenerative changes. Layering moderate right and trace left pleural effusions. Electronically Signed   By: Julian Hy M.D.   On: 07/13/2021 00:34   US RENAL  Result Date: 07/13/2021 CLINICAL DATA:  Urinary retention EXAM: RENAL / URINARY TRACT ULTRASOUND COMPLETE COMPARISON:  None. FINDINGS: Right Kidney: Renal measurements: 8.4 x 5.1 x 3.8 cm = volume: 87 mL. Increased renal cortical echogenicity. Simple cyst. No mass or hydronephrosis visualized. Left Kidney: Renal measurements: 8.4 x 5.2 x 3.6 cm = volume: 82 mL. Increased renal cortical echogenicity. No mass or hydronephrosis visualized. Bladder: Appears normal for degree of bladder distention. Other: None. IMPRESSION: Increased renal cortical echogenicity, in keeping with medical renal disease. No hydronephrosis. Electronically Signed   By: Delanna Ahmadi M.D.   On: 07/13/2021 12:02   DG Chest Portable 1 View  Result Date: 07/01/2021 CLINICAL DATA:  Shortness of breath and hypertension. EXAM: PORTABLE CHEST 1 VIEW COMPARISON:  April 29, 2021 FINDINGS: Mild atelectasis is seen within the right lung base. A small right pleural effusion is also seen. No pneumothorax is identified. The heart size and mediastinal contours are within normal limits. Radiopaque surgical clips are seen within the right upper quadrant. A spinal stimulator wire is seen with its distal tip overlying the T10 vertebral body. IMPRESSION: 1. Mild right basilar atelectasis. 2.  Small right pleural effusion. Electronically Signed   By: Virgina Norfolk M.D.   On: 07/09/2021 21:46       Antibiotics: Anti-infectives (From admission, onward)    Start     Dose/Rate Route Frequency Ordered Stop   07/15/21 1000  remdesivir 100 mg in sodium chloride 0.9 % 100 mL IVPB       See Hyperspace for full Linked Orders Report.   100 mg 200 mL/hr over 30 Minutes Intravenous Daily 07/09/2021 1004 07/19/21 0959   07/10/2021 1100  remdesivir 200 mg in sodium chloride 0.9% 250 mL IVPB       See Hyperspace for full Linked Orders Report.   200 mg 580 mL/hr over 30 Minutes Intravenous Once 07/23/2021 1004 07/24/2021 1203   07/13/21 1015  molnupiravir EUA (LAGEVRIO) capsule 800 mg  Status:  Discontinued        4 capsule Oral 2 times daily 07/13/21 1002 07/13/2021 1004   07/13/21 1000  nirmatrelvir/ritonavir EUA (renal dosing) (PAXLOVID) 2 tablet  Status:  Discontinued        2 tablet  Oral 2 times daily 07/13/21 0307 07/13/21 1002         DVT prophylaxis: Apixaban  Code Status: Full code  Family Communication: No family at bedside   Consultants:   Procedures:     Objective    Physical Examination:   General-appears in no acute distress Heart-S1-S2, regular, no murmur auscultated Lungs-clear to auscultation bilaterally, no wheezing or crackles auscultated Abdomen-soft, nontender, no organomegaly Extremities-no edema in the lower extremities Neuro-alert, oriented x3, no focal deficit noted  Status is: Inpatient  Dispo: The patient is from: Home              Anticipated d/c is to: Home              Anticipated d/c date is: 07/18/2021              Patient currently not stable for discharge  Barrier to discharge-ongoing treatment for COVID-19 infection  COVID-19 Labs  Recent Labs    07/24/2021 0132  DDIMER 0.93*  CRP 16.0*    Lab Results  Component Value Date   SARSCOV2NAA POSITIVE (A) 07/13/2021   [place] NEGATIVE 03/27/2021   [place] NEGATIVE  03/12/2021   [place] NEGATIVE 08/27/2020            Recent Results (from the past 240 hour(s))  Resp Panel by RT-PCR (Flu A&B, Covid) Nasopharyngeal Swab     Status: Abnormal   Collection Time: 07/13/21  1:03 AM   Specimen: Nasopharyngeal Swab; Nasopharyngeal(NP) swabs in vial transport medium  Result Value Ref Range Status   SARS Coronavirus 2 by RT PCR POSITIVE (A) NEGATIVE Final    Comment: RESULT CALLED TO, READ BACK BY AND VERIFIED WITH: C COBB,RN@[id number]  07/13/21 [place] (NOTE) SARS-CoV-2 target nucleic acids are DETECTED.  The SARS-CoV-2 RNA is generally detectable in upper respiratory specimens during the acute phase of infection. Positive results are indicative of the presence of the identified virus, but do not rule out bacterial infection or co-infection with other pathogens not detected by the test. Clinical correlation with patient history and other diagnostic information is necessary to determine patient infection status. The expected result is Negative.  Fact Sheet for Patients: EntrepreneurPulse.com.au  Fact Sheet for Healthcare Providers: IncredibleEmployment.be  This test is not yet approved or cleared by the [place] FDA and  has been authorized for detection and/or diagnosis of SARS-CoV-2 by FDA under an Emergency Use Authorization (EUA).  This EUA will remain in effect (meaning this test can be used)  for the duration of  the COVID-19 declaration under Section 564(b)(1) of the Act, 21 U.S.C. section 360bbb-3(b)(1), unless the authorization is terminated or revoked sooner.     Influenza A by PCR NEGATIVE NEGATIVE Final   Influenza B by PCR NEGATIVE NEGATIVE Final    Comment: (NOTE) The Xpert Xpress SARS-CoV-2/FLU/RSV plus assay is intended as an aid in the diagnosis of influenza from Nasopharyngeal swab specimens and should not be used as a sole basis for treatment. Nasal washings and aspirates are unacceptable  for Xpert Xpress SARS-CoV-2/FLU/RSV testing.  Fact Sheet for Patients: EntrepreneurPulse.com.au  Fact Sheet for Healthcare Providers: IncredibleEmployment.be  This test is not yet approved or cleared by the [place] FDA and has been authorized for detection and/or diagnosis of SARS-CoV-2 by FDA under an Emergency Use Authorization (EUA). This EUA will remain in effect (meaning this test can be used) for the duration of the COVID-19 declaration under Section 564(b)(1) of the Act, 21 U.S.C. section 360bbb-3(b)(1), unless the authorization is  terminated or revoked.  Performed at [facility], [place] [address] [place], [place] [address]     [facility] If 7PM-7AM, please contact night-coverage at [email or web address], Office  [phone]   07/23/2021, 2:43 PM  LOS: 0 days

## 2021-07-14 NOTE — Progress Notes (Signed)
Pt was noted to have a L sided droop at the mouth and the patient cannot open her left eye all the way.  Pt appears to be actively hallucinating reporting seeing people in the room other than staff.  She reports that the people are going to hit her with a stick and she is scared.  Patient is unable to tell me her name or where she is. Vitals and CBG WNL. MD [place] paged.

## 2021-07-14 NOTE — Code Documentation (Signed)
Responded to Code Stroke called at 2036 for L facial droop, LSN-1800. CBG-101, NIH-11, CTH-negative for acute changes. CTA unable to be completed d/t pt agitation. TNK not given-pt on eliquis. Pt not candidate for IR intervention. RN to complete bedside swallow eval prior to giving pt any oral intake. Plan for MRI.

## 2021-07-14 NOTE — Care Management Obs Status (Signed)
[place] NOTIFICATION   Patient Details  Name: Jodi Bowman MRN: [id number] Date of Birth: 1961/05/30   Medicare Observation Status Notification Given:  Yes    Bartholomew Crews, RN 07/24/2021, 10:46 AM

## 2021-07-14 NOTE — Progress Notes (Signed)
The patient passed the bedside swallow screen.  Primary RN Gerald Stabs notified.

## 2021-07-15 ENCOUNTER — Encounter (HOSPITAL_COMMUNITY): Payer: Medicare (Managed Care)

## 2021-07-15 ENCOUNTER — Inpatient Hospital Stay (HOSPITAL_COMMUNITY): Payer: Medicare (Managed Care)

## 2021-07-15 DIAGNOSIS — R55 Syncope and collapse: Secondary | ICD-10-CM | POA: Diagnosis not present

## 2021-07-15 DIAGNOSIS — R601 Generalized edema: Secondary | ICD-10-CM | POA: Diagnosis not present

## 2021-07-15 DIAGNOSIS — U071 COVID-19: Secondary | ICD-10-CM | POA: Diagnosis not present

## 2021-07-15 DIAGNOSIS — R339 Retention of urine, unspecified: Secondary | ICD-10-CM | POA: Diagnosis not present

## 2021-07-15 LAB — CBC WITH DIFFERENTIAL/PLATELET
Abs Immature Granulocytes: 0.1 10*3/uL — ABNORMAL HIGH (ref 0.00–0.07)
Basophils Absolute: 0 10*3/uL (ref 0.0–0.1)
Basophils Relative: 0 %
Eosinophils Absolute: 0.1 10*3/uL (ref 0.0–0.5)
Eosinophils Relative: 1 %
HCT: 30.1 % — ABNORMAL LOW (ref 36.0–46.0)
Hemoglobin: 10.2 g/dL — ABNORMAL LOW (ref 12.0–15.0)
Lymphocytes Relative: 0 %
Lymphs Abs: 0 10*3/uL — ABNORMAL LOW (ref 0.7–4.0)
MCH: 33.2 pg (ref 26.0–34.0)
MCHC: 33.9 g/dL (ref 30.0–36.0)
MCV: 98 fL (ref 80.0–100.0)
Monocytes Absolute: 0.4 10*3/uL (ref 0.1–1.0)
Monocytes Relative: 4 %
Myelocytes: 1 %
Neutro Abs: 10.4 10*3/uL — ABNORMAL HIGH (ref 1.7–7.7)
Neutrophils Relative %: 94 %
Platelets: 233 10*3/uL (ref 150–400)
RBC: 3.07 MIL/uL — ABNORMAL LOW (ref 3.87–5.11)
RDW: 20.6 % — ABNORMAL HIGH (ref 11.5–15.5)
WBC: 11.1 10*3/uL — ABNORMAL HIGH (ref 4.0–10.5)
nRBC: 0.2 % (ref 0.0–0.2)
nRBC: 2 /100 WBC — ABNORMAL HIGH

## 2021-07-15 LAB — IRON AND TIBC: Iron: 21 ug/dL — ABNORMAL LOW (ref 28–170)

## 2021-07-15 LAB — GLUCOSE, CAPILLARY
Glucose-Capillary: 103 mg/dL — ABNORMAL HIGH (ref 70–99)
Glucose-Capillary: 135 mg/dL — ABNORMAL HIGH (ref 70–99)
Glucose-Capillary: 26 mg/dL — CL (ref 70–99)
Glucose-Capillary: 40 mg/dL — CL (ref 70–99)
Glucose-Capillary: 41 mg/dL — CL (ref 70–99)
Glucose-Capillary: 49 mg/dL — ABNORMAL LOW (ref 70–99)
Glucose-Capillary: 56 mg/dL — ABNORMAL LOW (ref 70–99)
Glucose-Capillary: 70 mg/dL (ref 70–99)
Glucose-Capillary: 78 mg/dL (ref 70–99)
Glucose-Capillary: 83 mg/dL (ref 70–99)
Glucose-Capillary: 84 mg/dL (ref 70–99)
Glucose-Capillary: 91 mg/dL (ref 70–99)

## 2021-07-15 LAB — COMPREHENSIVE METABOLIC PANEL
ALT: 35 U/L (ref 0–44)
AST: 35 U/L (ref 15–41)
Albumin: <1.5 g/dL — ABNORMAL LOW (ref 3.5–5.0)
Alkaline Phosphatase: 71 U/L (ref 38–126)
Anion gap: 11 (ref 5–15)
BUN: 15 mg/dL (ref 8–23)
CO2: 22 mmol/L (ref 22–32)
Calcium: 7.8 mg/dL — ABNORMAL LOW (ref 8.9–10.3)
Chloride: 105 mmol/L (ref 98–111)
Creatinine, Ser: 1.05 mg/dL — ABNORMAL HIGH (ref 0.44–1.00)
GFR, Estimated: >60 mL/min (ref >60)
Glucose, Bld: 84 mg/dL (ref 70–99)
Potassium: 3.3 mmol/L — ABNORMAL LOW (ref 3.5–5.1)
Sodium: 138 mmol/L (ref 135–145)
Total Bilirubin: 1.3 mg/dL — ABNORMAL HIGH (ref 0.3–1.2)
Total Protein: 4.9 g/dL — ABNORMAL LOW (ref 6.5–8.1)

## 2021-07-15 LAB — D-DIMER, QUANTITATIVE: D-Dimer, Quant: 1.87 ug/mL-FEU — ABNORMAL HIGH (ref 0.00–0.50)

## 2021-07-15 LAB — C-REACTIVE PROTEIN: CRP: 27.1 mg/dL — ABNORMAL HIGH (ref <1.0)

## 2021-07-15 LAB — FERRITIN: Ferritin: 626 ng/mL — ABNORMAL HIGH (ref 11–307)

## 2021-07-15 MED ORDER — ENSURE ENLIVE PO LIQD
237.0000 mL | Freq: Three times a day (TID) | ORAL | Status: DC
  Administered 2021-07-15 – 2021-07-16 (×3): 237 mL via ORAL

## 2021-07-15 MED ORDER — POTASSIUM CHLORIDE CRYS ER 20 MEQ PO TBCR
40.0000 meq | EXTENDED_RELEASE_TABLET | Freq: Once | ORAL | Status: AC
  Administered 2021-07-15: 40 meq via ORAL
  Filled 2021-07-15: qty 2

## 2021-07-15 MED ORDER — OXYCODONE HCL 5 MG PO TABS
5.0000 mg | ORAL_TABLET | Freq: Four times a day (QID) | ORAL | Status: DC | PRN
  Administered 2021-07-15 – 2021-07-17 (×8): 5 mg via ORAL
  Filled 2021-07-15 (×8): qty 1

## 2021-07-15 MED ORDER — ADULT MULTIVITAMIN W/MINERALS CH
1.0000 | ORAL_TABLET | Freq: Two times a day (BID) | ORAL | Status: DC
  Administered 2021-07-15 – 2021-07-17 (×4): 1 via ORAL
  Filled 2021-07-15 (×4): qty 1

## 2021-07-15 MED ORDER — PROSOURCE PLUS PO LIQD
30.0000 mL | Freq: Two times a day (BID) | ORAL | Status: DC
  Administered 2021-07-16 (×2): 30 mL via ORAL
  Filled 2021-07-15 (×2): qty 30

## 2021-07-15 MED ORDER — LORAZEPAM 2 MG/ML IJ SOLN
1.0000 mg | Freq: Once | INTRAMUSCULAR | Status: DC
  Filled 2021-07-15: qty 1

## 2021-07-15 MED ORDER — DEXTROSE 50 % IV SOLN
INTRAVENOUS | Status: AC
  Administered 2021-07-15: 50 mL
  Filled 2021-07-15: qty 50

## 2021-07-15 MED ORDER — CALCIUM CARBONATE ANTACID 500 MG PO CHEW
1.0000 | CHEWABLE_TABLET | Freq: Three times a day (TID) | ORAL | Status: DC
  Administered 2021-07-15 – 2021-07-16 (×4): 200 mg via ORAL
  Filled 2021-07-15 (×5): qty 1

## 2021-07-15 MED ORDER — DEXTROSE-NACL 5-0.45 % IV SOLN: INTRAVENOUS | Status: DC

## 2021-07-15 NOTE — Progress Notes (Addendum)
Pt complaining of RLE pain this morning.  It is very tender to touch.  Does appear a bit red, but not noticeably swollen or hot.  Dr. Darrick Meigs notified.  Order received for lower extremity ultrasound.  PO tylenol administered.

## 2021-07-15 NOTE — Progress Notes (Addendum)
Pt had loss of IV access this morning, she accidentally pulled out her RUE PIV.  CBG was in the 40s, she drank some OJ, CBG reassessed and in the 20s.  IV team placed left AC PIV, stated that it was the only place they could find and to wrap/protect it.  IV wrapped, IV dextrose administered.  Fluids going at [facility] for remdesiver to be hung/  Dr. Darrick Meigs notified.  D5 1/2NS rate increased to 50cc/hr.  Upon going to hang remdesiver IV pump alarming occluded.  Difficulty flushing.  Pt complains of tenderness at site.  IV team paged again.  Received verbal order from Dr. Darrick Meigs to start PICC only if unable to obtain another peripheral site.   At this time CBG stable >100.

## 2021-07-15 NOTE — Consult Note (Addendum)
NEUROLOGY CONSULTATION NOTE   Date of service: July 15, 2021 Patient Name: Jodi Bowman MRN:  [id number] DOB:  14-Jan-1961 Reason for consult: "Facial asymmetry, concern for stroke" Requesting Provider: Oswald Hillock, MD _ _ _   _ __   _ __ _ _  __ __   _ __   __ _  History of Present Illness  Jodi Bowman is a 60 y.o. 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  Last known well of around 1800, she has had worsening encephalopathy throughout the day and was noted to have a left facial droop.  On my evaluation, patient is agitated and difficult to redirect and encephalopathic probably secondary to her COVID-19 infection.  She has a mild left facial asymmetry and she is moving both of her arms and legs.  She is yelling and speaking in full sentences however would not participate with the history taking or with the exam at all.  She is moving all of her extremities however some concern for mild left-sided weakness.  mRS: unknown tNK: not a candidate 2/2 Eliquis in the last 24 hours. LKW: 1800 on 07/13/2021 Thrombectomy: felt not to be a stroke and thus not pursued. NIHSS components Score: Comment  1a Level of Conscious 0[x]  1[]  2[]  3[]      1b LOC Questions 0[]  1[]  2[x]     Yells and speaks in full sentences but refusing to answer question or follow commands.  1c LOC Commands 0[]  1[]  2[x]       2 Best Gaze 0[x]  1[]  2[]       3 Visual 0[x]  1[]  2[]  3[]      4 Facial Palsy 0[]  1[x]  2[]  3[]      5a Motor Arm - left 0[x]  1[]  2[]  3[]  4[]  UN[]    5b Motor Arm - Right 0[x]  1[]  2[]  3[]  4[]  UN[]    6a Motor Leg - Left 0[]  1[]  2[]  3[x]  4[]  UN[]  Probably due to poor  participation with exam  6b Motor Leg - Right 0[]  1[]  2[x]  3[]  4[]  UN[]    7 Limb Ataxia 0[x]  1[]  2[]  3[]  UN[]     8 Sensory 0[x]  1[]  2[]  UN[]      9 Best Language 0[]  1[]  2[x]  3[]      10 Dysarthria 0[x]  1[]  2[]  UN[]      11 Extinct. and Inattention 0[x]  1[]  2[]       TOTAL: 11       ROS   Unable to obtain mostly because of poor participation with history taking and exam.  She is yelling and screaming at [place] but refuses to answer any questions or follow any commands at all.  I am unfortunately unable to get any review of system.  Past History   Past Medical History:  Diagnosis Date  . Arthritis   . Calculus of gallbladder without mention of cholecystitis or obstruction   . Dizziness   . Fibromyalgia   . GERD (gastroesophageal reflux disease)   . Goiter   . Hypercholesteremia   . Hypertension   . Lower back pain   . Migraine   . Nontoxic uninodular goiter    sees dr vollmer at [facility]  . Obesity   . PONV (postoperative nausea and vomiting)   . Sleep  apnea    STOPBANG=5  . Stroke [facility]) 2000   Past Surgical History:  Procedure Laterality Date  . ABDOMINAL HYSTERECTOMY    . BOWEL RESECTION N/A 03/06/2018   Procedure: SMALL BOWEL ANASTAMOSIS;  Surgeon: Clovis Riley, MD;  Location: [facility];  Service: General;  Laterality: N/A;  . CATARACT EXTRACTION W/ INTRAOCULAR LENS IMPLANT Bilateral   . CESAREAN SECTION  yrs ago   done x 2  . CHOLECYSTECTOMY  01/05/2012   Procedure: LAPAROSCOPIC CHOLECYSTECTOMY WITH INTRAOPERATIVE CHOLANGIOGRAM;  Surgeon: Pedro Earls, MD;  Location: [facility];  Service: General;  Laterality: N/A;  . COLONOSCOPY  10/08/2012   Procedure: COLONOSCOPY;  Surgeon: Beryle Beams, MD;  Location: [facility];  Service: Endoscopy;  Laterality: N/A;  . KNEE ARTHROSCOPY  one 1995 and 1 in 1997   both knees done  . LAPAROSCOPY N/A 03/06/2018   Procedure: LAPAROSCOPY DIAGNOSTIC WITH LYSIS OF ADHESIONS;  Surgeon: Clovis Riley, MD;  Location: [facility];  Service:  General;  Laterality: N/A;  . LAPAROTOMY N/A 03/06/2018   Procedure: EXPLORATORY LAPAROTOMY, RESECTION OF DISTAL ROUX, CLOSURE OF INTERNAL HERNIA;  Surgeon: Clovis Riley, MD;  Location: [place];  Service: General;  Laterality: N/A;  . LEFT HEART CATH AND CORONARY ANGIOGRAPHY N/A 08/29/2019   Procedure: LEFT HEART CATH AND CORONARY ANGIOGRAPHY;  Surgeon: Charolette Forward, MD;  Location: [place];  Service: Cardiovascular;  Laterality: N/A;  . LUMBAR WOUND DEBRIDEMENT N/A 04/03/2019   Procedure: LUMBAR WOUND WASHOUT;  Surgeon: Judith Part, MD;  Location: [facility];  Service: Neurosurgery;  Laterality: N/A;  . POSTERIOR LUMBAR FUSION  03/04/2019  . surgery for endometriosis  yrs ago  . TEE WITHOUT CARDIOVERSION N/A 03/22/2021   Procedure: TRANSESOPHAGEAL ECHOCARDIOGRAM (TEE);  Surgeon: Skeet Latch, MD;  Location: [facility];  Service: Cardiovascular;  Laterality: N/A;  . thryoid biopsy  December 01, 2011    at [facility]   Family History  Problem Relation Age of Onset  . Neuropathy Mother   . Diabetes Mother   . Hypertension Mother   . Transient ischemic attack Mother   . Seizures Mother   . Dementia Mother   . Other Father        MVA  . Neuropathy Sister   . Neuropathy Brother   . Cancer Brother        colon and lung  . Cancer Maternal Grandmother        colon   Social History   Socioeconomic History  . Marital status: Married    Spouse name: Not on file  . Number of children: 2  . Years of education: 59  . Highest education level: Not on file  Occupational History  . Occupation: Unemployed  Tobacco Use  . Smoking status: Never  . Smokeless tobacco: Never  Vaping Use  . Vaping Use: Never used  Substance and Sexual Activity  . Alcohol use: Not Currently    Alcohol/week: 0.0 standard drinks  . Drug use: No  . Sexual activity: Not Currently  Other Topics Concern  . Not on file  Social History Narrative   Lives at home with family.   Right-handed.   No caffeine  use.   Social Determinants of Health   Financial Resource Strain: Not on file  Food Insecurity: Not on file  Transportation Needs: Not on file  Physical Activity: Not on file  Stress: Not on file  Social Connections: Not on file   Allergies  Allergen Reactions  .  Shrimp [Shellfish Allergy] Anaphylaxis  . Naproxen Other (See Comments)    Makes stomach cramp and burn badly  . Shellfish-Derived Products     Other reaction(s): GI Upset (intolerance)    Medications   Medications Prior to Admission  Medication Sig Dispense Refill Last Dose  . acetaminophen (TYLENOL) 500 MG tablet Take 500-1,000 mg by mouth every 6 (six) hours as needed for moderate pain or headache.   Past Month  . albuterol (VENTOLIN HFA) 108 (90 Base) MCG/ACT inhaler Inhale 1-2 puffs into the lungs every 6 (six) hours as needed for wheezing or shortness of breath.   Past Month  . aspirin EC 81 MG tablet Take 81 mg by mouth daily.   06/30/2021  . D3-50 1.25 MG (50000 UT) capsule Take 50,000 Units by mouth every Monday, Wednesday, and Friday.   06/30/2021  . diclofenac Sodium (VOLTAREN) 1 % GEL Apply 4 g topically 2 (two) times daily as needed (back pain).   Past Month  . DULoxetine (CYMBALTA) 60 MG capsule Take 60 mg by mouth daily.   07/11/2021  . escitalopram (LEXAPRO) 10 MG tablet Take 10 mg by mouth at bedtime.   07/11/2021  . ferrous sulfate 325 (65 FE) MG EC tablet Take 325 mg by mouth daily.   07/09/2021  . fluticasone (FLONASE) 50 MCG/ACT nasal spray Place 1 spray into both nostrils daily as needed for allergies.   0 Past Month  . folic acid (FOLVITE) 1 MG tablet Take 1 mg by mouth daily.  11 07/22/2021  . HYDROcodone-acetaminophen (NORCO) 10-325 MG tablet Take 1 tablet by mouth 2 (two) times daily as needed for moderate pain or severe pain.   Past Week  . loratadine (CLARITIN) 10 MG tablet Take 10 mg by mouth daily.    06/30/2021  . losartan (COZAAR) 50 MG tablet Take 50 mg by mouth daily.   07/26/2021  .  Magnesium Oxide 400 MG CAPS Take 1 capsule (400 mg total) by mouth daily. 15 capsule 0 07/04/2021  . metoprolol succinate (TOPROL-XL) 100 MG 24 hr tablet Take 100 mg by mouth at bedtime.    07/11/2021  . mirtazapine (REMERON) 7.5 MG tablet Take 7.5 mg by mouth at bedtime.   07/11/2021  . morphine (MS CONTIN) 15 MG 12 hr tablet Take 15 mg by mouth in the morning and at bedtime.   07/01/2021  . ondansetron (ZOFRAN-ODT) 4 MG disintegrating tablet Take 4 mg by mouth every 8 (eight) hours as needed for nausea/vomiting.   Past Month  . pantoprazole (PROTONIX) 40 MG tablet Take 1 tablet (40 mg total) by mouth 2 (two) times daily. 60 tablet 0 07/21/2021  . polyethylene glycol (MIRALAX / GLYCOLAX) 17 g packet Take 17 g by mouth daily as needed for mild constipation. 14 each 0 unk  . spironolactone (ALDACTONE) 25 MG tablet Take 1 tablet (25 mg total) by mouth daily. 30 tablet 0 07/28/2021  . APIXABAN (ELIQUIS) VTE STARTER PACK (10MG  AND 5MG ) Take as directed on package: start with two-5mg  tablets twice daily for 7 days. On day 8, switch to one-5mg  tablet twice daily. (Patient not taking: No sig reported) 1 each 0 Completed Course  . clindamycin (CLEOCIN) 300 MG capsule Take 1 capsule (300 mg total) by mouth 4 (four) times daily. X 7 days (Patient not taking: No sig reported) 28 capsule 0 Completed Course  . erythromycin ophthalmic ointment Place a 1/2 inch ribbon of ointment into the lower eyelid four times a day for 7 days (Patient  not taking: No sig reported) 3.5 g 0 Completed Course  . furosemide (LASIX) 40 MG tablet Take 40 mg by mouth See admin instructions. Qd x 5 days (Patient not taking: Reported on 07/13/2021)   Completed Course  . isosorbide mononitrate (IMDUR) 30 MG 24 hr tablet Take 1 tablet (30 mg total) by mouth daily. (Patient not taking: No sig reported) 30 tablet 0 Not Taking  . KLOR-CON M20 20 MEQ tablet Take 20 mEq by mouth See admin instructions. Qd x 10 days (Patient not taking: Reported on  07/13/2021)   Completed Course  . pregabalin (LYRICA) 75 MG capsule Take 1 capsule (75 mg total) by mouth 3 (three) times daily. (Patient not taking: No sig reported) 90 capsule 0 Not Taking     Vitals   Vitals:   07/20/2021 1554 07/08/2021 2000 07/25/2021 2358 07/15/21 0042  BP: 113/78 105/80 100/75 120/86  Pulse: 83 (!) 58 78   Resp: 20  19   Temp: 98 F (36.7 C) 98.4 F (36.9 C) 98.3 F (36.8 C) (!) 97.3 F (36.3 C)  TempSrc: Axillary Axillary Axillary Axillary  SpO2: 98%  100%   Weight:      Height:         Body mass index is 40.36 kg/m.  Physical Exam   General: Laying comfortably in bed; agitated.  HENT: Normal oropharynx and mucosa. Normal external appearance of ears and nose.  Neck: Supple, no pain or tenderness  CV: No JVD. No peripheral edema.  Pulmonary: Symmetric Chest rise. Normal respiratory effort.  Abdomen: Soft to touch, non-tender.  Ext: No cyanosis, edema, or deformity  Skin: No rash. Normal palpation of skin.   Musculoskeletal: Normal digits and nails by inspection. No clubbing.   Neurologic Examination  Mental status/Cognition: Alert, oriented to self. Does not answer any more orientation questions. Speech/language: Mildly dysarthric speech, fluent, speaks in full sentences. However does not follow commands for me and I suspect that is from poor participation with exam. Cranial nerves:   CN II Pupils equal and reactive to light, blinks to threat bilaterally   CN III,IV,VI EOM intact, no gaze preference or deviation, no nystagmus   CN V normal sensation in V1, V2, and V3 segments bilaterally   CN VII Mild left facial droop.   CN VIII normal hearing to speech   CN IX & X normal palatal elevation, no uvular deviation   CN XI    CN XII    Motor:  Muscle bulk: poor, tone normal. Attempting to pull herself up and sit up in the bed with bilateral upper extremities.  She can keep bilateral upper extremities off the bed for more than 10 seconds with no  significant drift. She moves her right lower extremity more so than left lower extremity.  She does withdraw left lower extremity to pain.  Reflexes:  Right Left Comments  Pectoralis      Biceps (C5/6) 1 1   Brachioradialis (C5/6) 1 1    Triceps (C6/7) 1 1    Patellar (L3/4) 1 1    Achilles (S1)      Hoffman      Plantar     Jaw jerk    Sensation:  Light touch Localizes to pinch in all extremities.   Pin prick    Temperature    Vibration   Proprioception    Coordination/Complex Motor:  - Finger to Nose unable to assess due to poor participation but no obvious ataxia. - Heel to shin unable  to assess due to poor participation. - Rapid alternating movement unable to assess due to poor participation. - Gait: unsafe to assess with her agitation, encephalopathy.  Labs   CBC:  Recent Labs  Lab 06/30/2021 0132 07/15/21 0042  WBC 10.8* 11.1*  NEUTROABS 9.8* 10.4*  HGB 8.7* 10.2*  HCT 25.6* 30.1*  MCV 95.9 98.0  PLT 277 496    Basic Metabolic Panel:  Lab Results  Component Value Date   NA 138 07/15/2021   K 3.3 (L) 07/15/2021   CO2 22 07/15/2021   GLUCOSE 84 07/15/2021   BUN 15 07/15/2021   CREATININE 1.05 (H) 07/15/2021   CALCIUM 7.8 (L) 07/15/2021   GFRNONAA >60 07/15/2021   GFRAA >60 06/30/2020   Lipid Panel:  Lab Results  Component Value Date   LDLCALC 32 03/12/2021   HgbA1c:  Lab Results  Component Value Date   HGBA1C 4.3 (L) 03/12/2021   Urine Drug Screen:     Component Value Date/Time   LABOPIA POSITIVE (A) 03/27/2021 1421   COCAINSCRNUR NONE DETECTED 03/27/2021 1421   LABBENZ NONE DETECTED 03/27/2021 1421   AMPHETMU NONE DETECTED 03/27/2021 1421   THCU NONE DETECTED 03/27/2021 1421   LABBARB NONE DETECTED 03/27/2021 1421    Alcohol Level     Component Value Date/Time   ETH <10 03/27/2021 1421    CT Head without contrast: Personally reviewed and CTH was negative for a large hypodensity concerning for a large territory infarct or  hyperdensity concerning for an ICH  MRI Brain: Pending  Impression   Jodi Bowman is a 60 y.o. 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  Last known well of around 1800 on 07/16/2021, she has had worsening encephalopathy throughout the day and was noted to have a left facial droop.Marland Kitchen Her neurologic examination is notable for L facial droop with some LLE weakness. No obvious cortical signs, no aphasia(she yells and screams in full sentences), no vision deficit, no neglect.  I suspect that this is either recrudescence of her prior stroke symptoms or a potential new small vessel stroke. Attempted a CT angio head and neck but she was too agitated to be able to tolerate it. I decided against getting CTA under sedation given low suspicion for LVO to begin with.  I would recommend getting MRI Brain when she is calm and oriented and able to tolerate the study. I will order the study. Please let us know if the MRI shows an acute stroke or other significantly abnormality.  Impression: - Recrudescence of prior stroke symptoms in the setting of toxic metabolic encephalopathy.  Recommendations  - Recommend MRI Brain without contrast. This can be done when she is more calm, oriented and able to tolerate the study. Please let us know if the MRI Brain shows an acute stroke or other significant abnormality. - Okay to continue Eliquis. - Neurology will be available on an as needed basis. ______________________________________________________________________  Plan discussed with Dr. Marlowe Sax with the [facility] team.  Thank you for the opportunity to take part in  the care of this patient. If you have any further questions, please contact the neurology consultation attending.  Signed,  Red Bud Pager Number [phone] _ _  _   _ __   _ __ _ _  __ __   _ __   __ _  

## 2021-07-15 NOTE — Progress Notes (Signed)
Initial Nutrition Assessment  DOCUMENTATION CODES:  Not applicable  INTERVENTION:  Order Thiamine B1, Vitamin A, Vitamin C, Copper, Selenium, Zinc, and Iron panel labs.  Add Ensure Plus High Protein po TID, each supplement provides 350 kcal and 20 grams of protein.   Recommend providing pt with TWO multivitamins per day given history of bariatric surgery in addition to Tums  Patients with VSG/RYGB history- order two MVI with minerals and 500 mg TUMS three times daily  Patients with BPD/DS history-order two MVI with minerals and 500 mg TUMS four times daily  Discontinue Ensure Max.  Add 30 ml ProSource Plus po BID, each supplement provides 100 kcal and 15 grams of protein.    NUTRITION DIAGNOSIS:  Increased nutrient needs related to acute illness (COVID-19 infection) as evidenced by estimated needs.  GOAL:  Patient will meet greater than or equal to 90% of their needs  MONITOR:  PO intake, Supplement acceptance, Labs, Weight trends, I & O's  REASON FOR ASSESSMENT:  Consult Assessment of nutrition requirement/status  ASSESSMENT:  60 yo female with a PMH of OSA, HTN, prior stroke, CPS, and gastric bypass surgery (2017) who presents with weakness. Also found to be COVID-19 positive.  Pt unavailable at time of RD visit.  RD concerned due to history of gastric bypass and weight history is inconsistent. RD to message MD regarding ordering Vitamin labs.  Given unclear weight history, unsure if patient is obese or of normal weight. RD unable to visually see patient, so it is unclear.  Of note, pt does have edema.  See interventions for recommendations.  Supplements: Ensure Max daily  Medications: reviewed; Solu-Medrol, D5 with NaCl @ 10 ml/hr, oxycodone PO PRN (given once today)  Labs: reviewed; K 3.3 (L)  NUTRITION - FOCUSED PHYSICAL EXAM: Unable to perform - defer to follow-up  Diet Order:   Diet Order             Diet Heart Room service appropriate? Yes; Fluid  consistency: Thin  Diet effective now                  EDUCATION NEEDS:  No education needs have been identified at this time  Skin:  Skin Assessment: Reviewed RN Assessment  Last BM:  07/02/2021  Height:  Ht Readings from Last 1 Encounters:  07/13/21 5\' 2"  (1.575 m)   Weight:  Wt Readings from Last 1 Encounters:  07/13/21 100.1 kg   BMI:  Body mass index is 40.36 kg/m.  Estimated Nutritional Needs:  Kcal:  1700-1900 Protein:  70-85 grams Fluid:  >1.7 L  Derrel Nip, RD, LDN (she/her/hers) Registered Dietitian I After-Hours/Weekend Pager # in [phone]

## 2021-07-15 NOTE — Progress Notes (Signed)
Dextrose pushed due to CBG of 56.  Recheck CBG 49.  Dr. Darrick Meigs notified.

## 2021-07-15 NOTE — Progress Notes (Signed)
[facility]  PROGRESS NOTE  Jodi Bowman BTD:[id number] DOB: [birth year] DOA: 07/18/2021 PCP: Aletha Halim., PA-C   Brief HPI:    60 year old female with a history of OSA, hypertension, prior stroke, gastric bypass in 2017 came to hospital with worsening leg edema for the past several months.  CT abdominal scan in July was not impressive for liver abnormalities, 2D echo including TEE done earlier this year was negative for cardiac dysfunction.  She had history of gastric bypass surgery and has been having loose stools for months. Today she came to ED when she would have dizziness, generalized weakness, syncope on standing.  Also has been having shortness of breath for past 3 days. In the ED COVID-19 RT-PCR test came back positive.   Subjective   Patient seen and examined, still complains of left-sided facial weakness and left leg weakness.  Code stroke was called, she was seen by neurology.  CT head was unremarkable.  Neurology felt that this was recrudescence of her prior stroke symptoms or potentially new small vessel stroke.  MRI brain without contrast was ordered. This morning patient denies any complaints, ate her breakfast.   Assessment/Plan:    COVID-19 infection -Patient was initially started on Paxlovid, which was later changed to molnupiravir, due to Paxlovid interaction with apixaban -Patient developed shortness of breath, requiring 2 L/min of oxygen,  CRP elevated at 27; D-dimer 1.87 -molnupiravir was stopped and patient started on  remdesivir per pharmacy consultation -Also started on Solu-Medrol 60 mg IV every 12 hours -Chest x-ray obtained yesterday showed increasing bilateral pleural effusion with associated bibasilar opacities -Continue incentive spirometry, flutter valve, out of bed to chair  Anasarca Patient has hypoalbuminemia, with albumin level 1.6, prealbumin 7.3 -She has hypoalbuminemia since beginning of 2020 -She had gastric bypass surgery in  2017 -Has been having loose stools -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 -She does have hypomagnesemia and hypokalemia -Dietitian consulted  Sinus tachycardia -Patient was taking metoprolol XL 100 mg daily at home -We will start Toprol-XL at lower dose of 50 mg daily  Left-sided facial droop/left lower extremity weakness -CT head negative for stroke -Symptoms seem to have resolved -Follow MRI brain without contrast  Lower extremity pain -Patient complains of pain in the left lower extremity, will obtain venous duplex to rule out DVT -Patient is already on apixaban  Hypoglycemia -Patient having recurrent episodes of hypoglycemia -Start D5 half-normal saline at 50 mill per hour  Hypokalemia -Potassium is 3.3 -We will replace potassium and follow BMP in am  Urinary retention -Patient is that she has been having intermittent urine retention for past few months -Renal ultrasound shows no hydronephrosis -Could also be medication induced, patient is on SSRI at home -Foley catheter inserted -Correct electrolyte abnormalities -Consider voiding trial in 1 to 2 days   Syncope -Continue telemetry monitoring -Orthostatic vital signs every 4 hours x3 ordered, currently pending  -Had TEE earlier in June 2022, which showed EF of 60-65%, no wall motion abnormalities  Recent history of pulmonary embolism -Diagnosed in July of this year -We will continue with anticoagulation with apixaban  Scheduled medications:    (feeding supplement) PROSource Plus  30 mL Oral BID BM   apixaban  5 mg Oral BID   calcium carbonate  1 tablet Oral TID   Chlorhexidine Gluconate Cloth  6 each Topical Daily   dextrose       feeding supplement  237 mL Oral TID BM   LORazepam  1 mg Intravenous Once  methylPREDNISolone (SOLU-MEDROL) injection  60 mg Intravenous Daily   metoprolol succinate  50 mg Oral Daily   multivitamin with minerals  1 tablet Oral BID   potassium chloride  40 mEq Oral  Once     Data Reviewed:   CBG:  Recent Labs  Lab 07/15/21 0827 07/15/21 0949 07/15/21 1014 07/15/21 1208 07/15/21 1609  GLUCAP 41* 26* 91 103* 56*    SpO2: 93 % O2 Flow Rate (L/min): 2 L/min    Vitals:   07/15/21 0400 07/15/21 0821 07/15/21 1428 07/15/21 1610  BP: 117/71 115/84 (!) 102/55 90/65  Pulse: 69 87 81 80  Resp: 20 16 17 18   Temp: 97.9 F (36.6 C) 97.9 F (36.6 C)    TempSrc: Axillary Axillary    SpO2: 99% 100% 97% 93%  Weight:      Height:         Intake/Output Summary (Last 24 hours) at 07/15/2021 1706 Last data filed at 07/15/2021 0800 Gross per 24 hour  Intake 57.54 ml  Output 500 ml  Net -442.46 ml    10/15 1901 - 10/17 0700 In: 57.5 [I.V.:57.5] Out: 375 [Urine:375]  Filed Weights   06/30/2021 2039 07/13/21 2255  Weight: 51.3 kg 100.1 kg    Data Reviewed: Basic Metabolic Panel: Recent Labs  Lab 07/09/2021 2058 07/13/21 0120 07/28/2021 0132 07/15/21 0042  NA 138  --  139 138  K 2.7*  --  3.5 3.3*  CL 105  --  106 105  CO2 27  --  25 22  GLUCOSE 89  --  81 84  BUN 12  --  13 15  CREATININE 0.91  --  0.85 1.05*  CALCIUM 7.4*  --  7.5* 7.8*  MG  --  1.3* 1.8  --    Liver Function Tests: Recent Labs  Lab 07/13/21 0120 07/10/2021 0132 07/15/21 0042  AST 23 32 35  ALT 34 31 35  ALKPHOS 74 65 71  BILITOT 2.9* 1.5* 1.3*  PROT 4.8* 4.4* 4.9*  ALBUMIN 1.6* <1.5* <1.5*   No results for input(s): LIPASE, AMYLASE in the last 168 hours. No results for input(s): AMMONIA in the last 168 hours. CBC: Recent Labs  Lab 07/26/2021 2058 07/04/2021 0132 07/15/21 0042  WBC 7.5 10.8* 11.1*  NEUTROABS 5.3 9.8* 10.4*  HGB 8.7* 8.7* 10.2*  HCT 26.3* 25.6* 30.1*  MCV 98.1 95.9 98.0  PLT 282 277 233   Cardiac Enzymes: No results for input(s): CKTOTAL, CKMB, CKMBINDEX, TROPONINI in the last 168 hours. BNP (last 3 results) Recent Labs    04/29/21 1309 07/10/2021 2058  BNP 82.5 42.6    ProBNP (last 3 results) No results for input(s): PROBNP  in the last 8760 hours.  CBG: Recent Labs  Lab 07/15/21 0827 07/15/21 0949 07/15/21 1014 07/15/21 1208 07/15/21 1609  GLUCAP 41* 26* 91 103* 56*       Radiology Reports  DG Chest Port 1V same Day  Result Date: 07/09/2021 CLINICAL DATA:  Dyspnea EXAM: PORTABLE CHEST 1 VIEW COMPARISON:  07/19/2021 FINDINGS: Stable heart size. Low lung volumes. Increasing bilateral pleural effusions with associated bibasilar opacities. No pneumothorax. IMPRESSION: Increasing bilateral pleural effusions with associated bibasilar opacities, likely atelectasis. Electronically Signed   By: Davina Poke D.O.   On: 07/24/2021 15:00   CT HEAD CODE STROKE WO CONTRAST  Result Date: 07/21/2021 CLINICAL DATA:  Code stroke.  Acute neurologic deficit EXAM: CT HEAD WITHOUT CONTRAST TECHNIQUE: Contiguous axial images were obtained from the base of the  skull through the vertex without intravenous contrast. COMPARISON:  None. FINDINGS: Brain: There is no mass, hemorrhage or extra-axial collection. The size and configuration of the ventricles and extra-axial CSF spaces are normal. The brain parenchyma is normal, without evidence of acute or chronic infarction. Vascular: No abnormal hyperdensity of the major intracranial arteries or dural venous sinuses. No intracranial atherosclerosis. Skull: The visualized skull base, calvarium and extracranial soft tissues are normal. Sinuses/Orbits: No fluid levels or advanced mucosal thickening of the visualized paranasal sinuses. No mastoid or middle ear effusion. The orbits are normal. ASPECTS [facility] Stroke Program Early CT Score) - Ganglionic level infarction (caudate, lentiform nuclei, internal capsule, insula, M1-M3 cortex): 7 - Supraganglionic infarction (M4-M6 cortex): 3 Total score (0-10 with 10 being normal): 10 IMPRESSION: 1. Normal head CT. 2. ASPECTS is 10. These results were communicated to Dr. Donnetta Simpers at 9:11 pm on 07/09/2021 by text page via the [facility] messaging  system. Electronically Signed   By: Ulyses Jarred M.D.   On: 07/24/2021 21:12       Antibiotics: Anti-infectives (From admission, onward)    Start     Dose/Rate Route Frequency Ordered Stop   07/15/21 1000  remdesivir 100 mg in sodium chloride 0.9 % 100 mL IVPB       See Hyperspace for full Linked Orders Report.   100 mg 200 mL/hr over 30 Minutes Intravenous Daily 07/29/2021 1004 07/19/21 0959   07/13/2021 1100  remdesivir 200 mg in sodium chloride 0.9% 250 mL IVPB       See Hyperspace for full Linked Orders Report.   200 mg 580 mL/hr over 30 Minutes Intravenous Once 07/06/2021 1004 07/27/2021 1203   07/13/21 1015  molnupiravir EUA (LAGEVRIO) capsule 800 mg  Status:  Discontinued        4 capsule Oral 2 times daily 07/13/21 1002 07/03/2021 1004   07/13/21 1000  nirmatrelvir/ritonavir EUA (renal dosing) (PAXLOVID) 2 tablet  Status:  Discontinued        2 tablet Oral 2 times daily 07/13/21 0307 07/13/21 1002         DVT prophylaxis: Apixaban  Code Status: Full code  Family Communication: No family at bedside   Consultants:   Procedures:     Objective    Physical Examination:   General-appears in no acute distress Heart-S1-S2, regular, no murmur auscultated Lungs-clear to auscultation bilaterally, no wheezing or crackles auscultated Abdomen-soft, nontender, no organomegaly Extremities-no edema in the lower extremities Neuro-alert, oriented x3, no focal deficit noted  Status is: Inpatient  Dispo: The patient is from: Home              Anticipated d/c is to: Home              Anticipated d/c date is: 07/18/2021              Patient currently not stable for discharge  Barrier to discharge-ongoing treatment for COVID-19 infection  COVID-19 Labs  Recent Labs    07/28/2021 0132 07/15/21 0042  DDIMER 0.93* 1.87*  CRP 16.0* 27.1*    Lab Results  Component Value Date   SARSCOV2NAA POSITIVE (A) 07/13/2021   [place] NEGATIVE 03/27/2021   [place] NEGATIVE  03/12/2021   [place] NEGATIVE 08/27/2020            Recent Results (from the past 240 hour(s))  Resp Panel by RT-PCR (Flu A&B, Covid) Nasopharyngeal Swab     Status: Abnormal   Collection Time: 07/13/21  1:03 AM   Specimen: Nasopharyngeal  Swab; Nasopharyngeal(NP) swabs in vial transport medium  Result Value Ref Range Status   SARS Coronavirus 2 by RT PCR POSITIVE (A) NEGATIVE Final    Comment: RESULT CALLED TO, READ BACK BY AND VERIFIED WITH: C COBB,RN@0253  07/13/21 [place] (NOTE) SARS-CoV-2 target nucleic acids are DETECTED.  The SARS-CoV-2 RNA is generally detectable in upper respiratory specimens during the acute phase of infection. Positive results are indicative of the presence of the identified virus, but do not rule out bacterial infection or co-infection with other pathogens not detected by the test. Clinical correlation with patient history and other diagnostic information is necessary to determine patient infection status. The expected result is Negative.  Fact Sheet for Patients: EntrepreneurPulse.com.au  Fact Sheet for Healthcare Providers: IncredibleEmployment.be  This test is not yet approved or cleared by the [place] FDA and  has been authorized for detection and/or diagnosis of SARS-CoV-2 by FDA under an Emergency Use Authorization (EUA).  This EUA will remain in effect (meaning this test can be used)  for the duration of  the COVID-19 declaration under Section 564(b)(1) of the Act, 21 U.S.C. section 360bbb-3(b)(1), unless the authorization is terminated or revoked sooner.     Influenza A by PCR NEGATIVE NEGATIVE Final   Influenza B by PCR NEGATIVE NEGATIVE Final    Comment: (NOTE) The Xpert Xpress SARS-CoV-2/FLU/RSV plus assay is intended as an aid in the diagnosis of influenza from Nasopharyngeal swab specimens and should not be used as a sole basis for treatment. Nasal washings and aspirates are unacceptable  for Xpert Xpress SARS-CoV-2/FLU/RSV testing.  Fact Sheet for Patients: EntrepreneurPulse.com.au  Fact Sheet for Healthcare Providers: IncredibleEmployment.be  This test is not yet approved or cleared by the [place] FDA and has been authorized for detection and/or diagnosis of SARS-CoV-2 by FDA under an Emergency Use Authorization (EUA). This EUA will remain in effect (meaning this test can be used) for the duration of the COVID-19 declaration under Section 564(b)(1) of the Act, 21 U.S.C. section 360bbb-3(b)(1), unless the authorization is terminated or revoked.  Performed at [facility], [place] [address] [place], [place] [address]     [facility] If 7PM-7AM, please contact night-coverage at [email or web address], Office  [phone]   07/15/2021, 5:06 PM  LOS: 1 day

## 2021-07-16 ENCOUNTER — Inpatient Hospital Stay (HOSPITAL_COMMUNITY): Payer: Medicare (Managed Care)

## 2021-07-16 DIAGNOSIS — M79604 Pain in right leg: Secondary | ICD-10-CM

## 2021-07-16 DIAGNOSIS — U071 COVID-19: Secondary | ICD-10-CM | POA: Diagnosis not present

## 2021-07-16 DIAGNOSIS — R339 Retention of urine, unspecified: Secondary | ICD-10-CM | POA: Diagnosis not present

## 2021-07-16 DIAGNOSIS — R601 Generalized edema: Secondary | ICD-10-CM | POA: Diagnosis not present

## 2021-07-16 DIAGNOSIS — R609 Edema, unspecified: Secondary | ICD-10-CM

## 2021-07-16 DIAGNOSIS — R55 Syncope and collapse: Secondary | ICD-10-CM | POA: Diagnosis not present

## 2021-07-16 LAB — CBC WITH DIFFERENTIAL/PLATELET
Abs Immature Granulocytes: 0.2 10*3/uL — ABNORMAL HIGH (ref 0.00–0.07)
Basophils Absolute: 0 10*3/uL (ref 0.0–0.1)
Basophils Relative: 0 %
Eosinophils Absolute: 0 10*3/uL (ref 0.0–0.5)
Eosinophils Relative: 0 %
HCT: 26.8 % — ABNORMAL LOW (ref 36.0–46.0)
Hemoglobin: 9 g/dL — ABNORMAL LOW (ref 12.0–15.0)
Lymphocytes Relative: 3 %
Lymphs Abs: 0.3 10*3/uL — ABNORMAL LOW (ref 0.7–4.0)
MCH: 33 pg (ref 26.0–34.0)
MCHC: 33.6 g/dL (ref 30.0–36.0)
MCV: 98.2 fL (ref 80.0–100.0)
Metamyelocytes Relative: 2 %
Monocytes Absolute: 0.9 10*3/uL (ref 0.1–1.0)
Monocytes Relative: 11 %
Neutro Abs: 7.1 10*3/uL (ref 1.7–7.7)
Neutrophils Relative %: 84 %
Platelets: 173 10*3/uL (ref 150–400)
RBC: 2.73 MIL/uL — ABNORMAL LOW (ref 3.87–5.11)
RDW: 21.5 % — ABNORMAL HIGH (ref 11.5–15.5)
WBC: 8.4 10*3/uL (ref 4.0–10.5)
nRBC: 0 /100 WBC
nRBC: 0.2 % (ref 0.0–0.2)

## 2021-07-16 LAB — GLUCOSE, CAPILLARY
Glucose-Capillary: 11 mg/dL — CL (ref 70–99)
Glucose-Capillary: 112 mg/dL — ABNORMAL HIGH (ref 70–99)
Glucose-Capillary: 115 mg/dL — ABNORMAL HIGH (ref 70–99)
Glucose-Capillary: 12 mg/dL — CL (ref 70–99)
Glucose-Capillary: 159 mg/dL — ABNORMAL HIGH (ref 70–99)
Glucose-Capillary: 33 mg/dL — CL (ref 70–99)
Glucose-Capillary: 38 mg/dL — CL (ref 70–99)
Glucose-Capillary: 46 mg/dL — ABNORMAL LOW (ref 70–99)
Glucose-Capillary: 51 mg/dL — ABNORMAL LOW (ref 70–99)
Glucose-Capillary: 67 mg/dL — ABNORMAL LOW (ref 70–99)
Glucose-Capillary: 76 mg/dL (ref 70–99)
Glucose-Capillary: 84 mg/dL (ref 70–99)

## 2021-07-16 LAB — COMPREHENSIVE METABOLIC PANEL
ALT: 24 U/L (ref 0–44)
AST: 28 U/L (ref 15–41)
Albumin: <1.5 g/dL — ABNORMAL LOW (ref 3.5–5.0)
Alkaline Phosphatase: 68 U/L (ref 38–126)
Anion gap: 11 (ref 5–15)
BUN: 19 mg/dL (ref 8–23)
CO2: 21 mmol/L — ABNORMAL LOW (ref 22–32)
Calcium: 7.3 mg/dL — ABNORMAL LOW (ref 8.9–10.3)
Chloride: 104 mmol/L (ref 98–111)
Creatinine, Ser: 1.19 mg/dL — ABNORMAL HIGH (ref 0.44–1.00)
GFR, Estimated: 52 mL/min — ABNORMAL LOW (ref >60)
Glucose, Bld: 80 mg/dL (ref 70–99)
Potassium: 4.3 mmol/L (ref 3.5–5.1)
Sodium: 136 mmol/L (ref 135–145)
Total Bilirubin: 0.9 mg/dL (ref 0.3–1.2)
Total Protein: 3.8 g/dL — ABNORMAL LOW (ref 6.5–8.1)

## 2021-07-16 LAB — D-DIMER, QUANTITATIVE: D-Dimer, Quant: 1.87 ug/mL-FEU — ABNORMAL HIGH (ref 0.00–0.50)

## 2021-07-16 LAB — PHOSPHORUS: Phosphorus: 2.1 mg/dL — ABNORMAL LOW (ref 2.5–4.6)

## 2021-07-16 LAB — MAGNESIUM: Magnesium: 1.6 mg/dL — ABNORMAL LOW (ref 1.7–2.4)

## 2021-07-16 LAB — GLUCOSE, RANDOM: Glucose, Bld: 81 mg/dL (ref 70–99)

## 2021-07-16 LAB — C-REACTIVE PROTEIN: CRP: 29.2 mg/dL — ABNORMAL HIGH (ref <1.0)

## 2021-07-16 MED ORDER — ALBUMIN HUMAN 25 % IV SOLN
12.5000 g | Freq: Once | INTRAVENOUS | Status: AC
  Administered 2021-07-16: 12.5 g via INTRAVENOUS
  Filled 2021-07-16: qty 50

## 2021-07-16 MED ORDER — DEXTROSE 50 % IV SOLN
INTRAVENOUS | Status: AC
  Administered 2021-07-16: 50 mL
  Filled 2021-07-16: qty 50

## 2021-07-16 MED ORDER — SODIUM CHLORIDE 0.9 % IV BOLUS: 500.0000 mL | Freq: Once | INTRAVENOUS | Status: DC

## 2021-07-16 MED ORDER — DEXTROSE-NACL 10-0.45 % IV SOLN
INTRAVENOUS | Status: DC
  Filled 2021-07-16 (×9): qty 1000

## 2021-07-16 MED ORDER — PROSOURCE TF PO LIQD
45.0000 mL | Freq: Two times a day (BID) | ORAL | Status: DC
  Administered 2021-07-17: 45 mL
  Filled 2021-07-16: qty 45

## 2021-07-16 MED ORDER — DEXTROSE 50 % IV SOLN
25.0000 g | INTRAVENOUS | Status: AC
  Administered 2021-07-16: 25 g via INTRAVENOUS
  Filled 2021-07-16: qty 50

## 2021-07-16 MED ORDER — ALBUMIN HUMAN 5 % IV SOLN: 12.5000 g | Freq: Once | INTRAVENOUS | Status: DC

## 2021-07-16 MED ORDER — VITAL 1.5 CAL PO LIQD
1000.0000 mL | ORAL | Status: DC
  Filled 2021-07-16: qty 1000

## 2021-07-16 MED ORDER — PROSOURCE TF PO LIQD: 45.0000 mL | Freq: Two times a day (BID) | ORAL | Status: DC

## 2021-07-16 MED ORDER — SODIUM CHLORIDE 0.9 % IV BOLUS
250.0000 mL | Freq: Once | INTRAVENOUS | Status: AC
  Administered 2021-07-16: 250 mL via INTRAVENOUS

## 2021-07-16 MED ORDER — VITAL HIGH PROTEIN PO LIQD
1000.0000 mL | ORAL | Status: DC
  Filled 2021-07-16: qty 1000

## 2021-07-16 NOTE — Progress Notes (Signed)
Hypoglycemic Event  CBG: 33  Treatment: D50 50 mL (25 gm)  Symptoms: None  Follow-up CBG: Time:1159 CBG Result:112  Possible Reasons for Event: Inadequate meal intake  Comments/MD notified:Dr [place]     Brant Peets [employer]

## 2021-07-16 NOTE — Progress Notes (Signed)
Pt's CBG is 46, drank approx 6oz OJ. Ninetta Lights MD notified. Awaiting orders.

## 2021-07-16 NOTE — Plan of Care (Signed)
  Problem: Education: Goal: Knowledge of General Education information will improve Description Including pain rating scale, medication(s)/side effects and non-pharmacologic comfort measures Outcome: Progressing   Problem: Health Behavior/Discharge Planning: Goal: Ability to manage health-related needs will improve Outcome: Progressing   

## 2021-07-16 NOTE — Progress Notes (Signed)
Hypoglycemic Event  CBG: 11  Treatment: D50 50 mL (25 gm)  Symptoms: None  Follow-up CBG: OGAC:2984  lab draw 7308  CBG Result:51 lab results 81  Possible Reasons for Event: Inadequate meal intake  Comments/MD notified:Dr Ovid Curd, Naamah Boggess [place]

## 2021-07-16 NOTE — Plan of Care (Signed)
  Problem: Education: Goal: Knowledge of General Education information will improve Description: Including pain rating scale, medication(s)/side effects and non-pharmacologic comfort measures 07/16/2021 1545 by Emmaline Life, RN Outcome: Progressing 07/16/2021 1544 by Emmaline Life, RN Outcome: Progressing

## 2021-07-16 NOTE — Progress Notes (Signed)
Hypoglycemic Event  CBG: 38  Treatment: 8 oz juice/soda and D50 50 mL (25 gm)  Symptoms: None  Follow-up CBG: Time:159 CBG Result:done  Possible Reasons for Event: Unknown  Comments/MD notified:MD notified; new orders received    Jodi Bowman

## 2021-07-16 NOTE — Progress Notes (Signed)
Nutrition Follow-up  DOCUMENTATION CODES:   Severe malnutrition in context of chronic illness  INTERVENTION:   -Monitor for results of thiamine, vitamin A, copper, selenium, zinc, and iron -D/c Ensure Max -D/c Ensure ENlive -D/c Prosource Plus -Continue MVI with minerals BID -Continue calcium carbonate TID -Once cortrak is placed:  Initiate Vital 1.5 @ 20 ml/hr and increase by 10 ml every 8 hours to goal rate of 50 ml/hr.   45 ml Prosource TF BID.    Tube feeding regimen provides 1880 kcal (100% of needs), 103 grams of protein, and 917 ml of H2O.    -Monitor Mg, K, and Phos daily and replete as needed secondary to high refeeding risk  NUTRITION DIAGNOSIS:   Severe Malnutrition related to chronic illness (possible malbasorption secondary to gastric bypass surgery) as evidenced by moderate fat depletion, severe fat depletion, moderate muscle depletion, severe muscle depletion.  Ongoing  GOAL:   Patient will meet greater than or equal to 90% of their needs  Unmet  MONITOR:   PO intake, Supplement acceptance, Labs, Weight trends, TF tolerance, Skin, I & O's  REASON FOR ASSESSMENT:   Consult Assessment of nutrition requirement/status  ASSESSMENT:   60 yo female with a PMH of OSA, HTN, prior stroke, CPS, and gastric bypass surgery (2017) who presents with weakness. Also found to be COVID-19 positive.  Reviewed I/O's: -25 ml x 24 hours and +54 ml since admission  UOP: 700 ml x 24 hours  Case discussed with MD, who is requesting cortrak placement. MD gave RD verbal permission to order feedings.   Case discussed with RN, who reports persistent hypoglycemia. Pt is eating very little- she refused both Ensure supplements and ate only a few bites of lunch today.   Spoke with pt at bedside, who was unable to provide meaningful history. She continued to complain of her legs hurting (RN already medicated her).   Reviewed wt hx; noted no wt loss over the past year.   Per  MD notes, pt with possible malabsorption issues. Pt would benefit from cortrak due to poor oral intake and malnutrition. Will start with a semi-elemental formula to promote tolerance given possible malabsorption.   Medications reviewed and include dextrose 10% and 0.45% NaCl infusion @ 50 ml/hr.   Labs reviewed: CBGS: 12-112 (inpatient orders for glycemic control are ).    NUTRITION - FOCUSED PHYSICAL EXAM:  Flowsheet Row Most Recent Value  Orbital Region Moderate depletion  Upper Arm Region Severe depletion  Thoracic and Lumbar Region Severe depletion  Buccal Region Moderate depletion  Temple Region Moderate depletion  Clavicle Bone Region Severe depletion  Clavicle and Acromion Bone Region Severe depletion  Scapular Bone Region Severe depletion  Dorsal Hand Moderate depletion  Patellar Region Severe depletion  Anterior Thigh Region Severe depletion  Posterior Calf Region Severe depletion  Edema (RD Assessment) None  Hair Reviewed  Eyes Reviewed  Mouth Reviewed  Skin Reviewed  Nails Reviewed       Diet Order:   Diet Order             Diet regular Room service appropriate? Yes; Fluid consistency: Thin  Diet effective now                   EDUCATION NEEDS:   No education needs have been identified at this time  Skin:  Skin Assessment: Reviewed RN Assessment  Last BM:  07/16/21  Height:   Ht Readings from Last 1 Encounters:  07/13/21 5\' 2"  (1.575  m)    Weight:   Wt Readings from Last 1 Encounters:  07/13/21 100.1 kg   BMI:  Body mass index is 40.36 kg/m.  Estimated Nutritional Needs:   Kcal:  7573-2256  Protein:  100-115 grams  Fluid:  > 1.7 L    Loistine Chance, RD, LDN, [place] Registered Dietitian II Certified Diabetes Care and Education Specialist Please refer to [facility] for RD and/or RD on-call/weekend/after hours pager

## 2021-07-16 NOTE — Progress Notes (Signed)
[facility]  PROGRESS NOTE  Jodi Bowman WJX:[id number] DOB: [birth year] DOA: 07/18/2021 PCP: Aletha Halim., PA-C   Brief HPI:    60 year old female with a history of OSA, hypertension, prior stroke, gastric bypass in 2017 came to hospital with worsening leg edema for the past several months.  CT abdominal scan in July was not impressive for liver abnormalities, 2D echo including TEE done earlier this year was negative for cardiac dysfunction.  She had history of gastric bypass surgery and has been having loose stools for months. Today she came to ED when she would have dizziness, generalized weakness, syncope on standing.  Also has been having shortness of breath for past 3 days. In the ED COVID-19 RT-PCR test came back positive.  10/17-patient complained of  left-sided facial weakness and left leg weakness.  Code stroke was called, she was seen by neurology.  CT head was unremarkable.  Neurology felt that this was recrudescence of her prior stroke symptoms or potentially new small vessel stroke.  MRI brain without contrast was ordered.  Subjective   Patient seen and examined, continues to have severe hypoglycemic episodes.  Patient has very poor p.o. intake, not eating much in the hospital.  Ensure supplements are ordered however she has not taken the supplements in past 24 hours.   Assessment/Plan:    COVID-19 infection -Patient was initially started on Paxlovid, which was later changed to molnupiravir, due to Paxlovid interaction with apixaban -Patient developed shortness of breath, requiring 2 L/min of oxygen,  CRP elevated at 29.2; D-dimer 1.87; oxygen requirement has not gone up, chest x-ray did not show COVID-19 pneumonia pattern -molnupiravir was stopped and patient started on  remdesivir per pharmacy consultation -Also started on Solu-Medrol 60 mg IV every 12 hours -Chest x-ray obtained yesterday showed increasing bilateral pleural effusion with associated  bibasilar opacities -Continue incentive spirometry, flutter valve, out of bed to chair  Hypoglycemia Patient having recurrent episodes of severe hypoglycemia -Dietitian was consulted -Patient has very poor p.o. intake in the hospital -She has been refusing her Ensure supplements -We will place cor track feeding tube in a.m.  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 -She does have hypomagnesemia and hypokalemia -Dietitian consulted; started on protein supplements  Sinus tachycardia -Patient was taking metoprolol XL 100 mg daily at home -We will start Toprol-XL at lower dose of 50 mg daily  Left-sided facial droop/left lower extremity weakness -CT head negative for stroke -Symptoms seem to have resolved -Follow MRI brain without contrast  Lower extremity pain/right lower extremity swelling -Patient complains of pain in the left lower extremity, will obtain venous duplex to rule out DVT -Patient is already on apixaban  Hypoglycemia -Patient having recurrent episodes of hypoglycemia -Continue D10 half-normal saline at 50 mL/h  Hypokalemia -Replete  Urinary retention -Patient is that she has been having intermittent urine retention for past few months -Renal ultrasound shows no hydronephrosis -Could also be medication induced, patient is on SSRI at home -Foley catheter inserted -Correct electrolyte abnormalities -Consider voiding trial in 1 to 2 days   Syncope -Continue telemetry monitoring -Orthostatic vital signs every 4 hours x3 ordered, currently pending  -Had TEE earlier in June 2022, which showed EF of 60-65%, no wall motion abnormalities  Recent history of pulmonary embolism -Diagnosed in July of this year -We will continue with  anticoagulation with  apixaban  Scheduled medications:    apixaban  5 mg Oral BID   calcium carbonate  1 tablet Oral TID   Chlorhexidine Gluconate Cloth  6 each Topical Daily   [START ON 08-08-21] feeding supplement (PROSource TF)  45 mL Per Tube BID   LORazepam  1 mg Intravenous Once   methylPREDNISolone (SOLU-MEDROL) injection  60 mg Intravenous Daily   multivitamin with minerals  1 tablet Oral BID   potassium chloride  40 mEq Oral Once     Data Reviewed:   CBG:  Recent Labs  Lab 07/16/21 0812 07/16/21 0834 07/16/21 1122 07/16/21 1159 07/16/21 1544  GLUCAP 12* 51* 33* 112* 76    SpO2: 94 % O2 Flow Rate (L/min): 2 L/min    Vitals:   07/16/21 0446 07/16/21 0820 07/16/21 1130 07/16/21 1546  BP: (!) 86/70 (!) 81/60 (!) 86/65 92/67  Pulse:  96 96   Resp:  17 19 20   Temp:  97.9 F (36.6 C) 98.2 F (36.8 C) 98.4 F (36.9 C)  TempSrc:  Axillary Axillary Axillary  SpO2:  92%  94%  Weight:      Height:         Intake/Output Summary (Last 24 hours) at 07/16/2021 1709 Last data filed at 07/16/2021 1525 Gross per 24 hour  Intake 1524.7 ml  Output 475 ml  Net 1049.7 ml    10/16 1901 - 10/18 0700 In: 732.3 [I.V.:732.3] Out: 700 [Urine:700]  Filed Weights   07/13/2021 2039 07/13/21 2255  Weight: 51.3 kg 100.1 kg    Data Reviewed: Basic Metabolic Panel: Recent Labs  Lab 07/13/2021 2058 07/13/21 0120 07/27/2021 0132 07/15/21 0042 07/16/21 0133 07/16/21 0859  NA 138  --  139 138 136  --   K 2.7*  --  3.5 3.3* 4.3  --   CL 105  --  106 105 104  --   CO2 27  --  25 22 21*  --   GLUCOSE 89  --  81 84 80 81  BUN 12  --  13 15 19   --   CREATININE 0.91  --  0.85 1.05* 1.19*  --   CALCIUM 7.4*  --  7.5* 7.8* 7.3*  --   MG  --  1.3* 1.8  --   --   --    Liver Function Tests: Recent Labs  Lab 07/13/21 0120 07/28/2021 0132 07/15/21 0042 07/16/21 0133  AST 23 32 35 28  ALT 34 31 35 24  ALKPHOS 74 65 71 68  BILITOT 2.9* 1.5* 1.3* 0.9  PROT 4.8* 4.4* 4.9* 3.8*  ALBUMIN 1.6*  <1.5* <1.5* <1.5*   No results for input(s): LIPASE, AMYLASE in the last 168 hours. No results for input(s): AMMONIA in the last 168 hours. CBC: Recent Labs  Lab 07/24/2021 2058 07/13/2021 0132 07/15/21 0042 07/16/21 0133  WBC 7.5 10.8* 11.1* 8.4  NEUTROABS 5.3 9.8* 10.4* 7.1  HGB 8.7* 8.7* 10.2* 9.0*  HCT 26.3* 25.6* 30.1* 26.8*  MCV 98.1 95.9 98.0 98.2  PLT 282 277 233 173   Cardiac Enzymes: No results for input(s): CKTOTAL, CKMB, CKMBINDEX, TROPONINI in the last 168 hours. BNP (last 3 results) Recent Labs    04/29/21 1309 07/16/2021 2058  BNP 82.5 42.6    ProBNP (last 3 results) No results for input(s): PROBNP in the last 8760 hours.  CBG: Recent Labs  Lab 07/16/21 0812 07/16/21 0834 07/16/21 1122 07/16/21 1159 07/16/21 1544  GLUCAP 12* 51* 33* 112*  16       Radiology Reports  CT HEAD CODE STROKE WO CONTRAST  Result Date: 07/10/2021 CLINICAL DATA:  Code stroke.  Acute neurologic deficit EXAM: CT HEAD WITHOUT CONTRAST TECHNIQUE: Contiguous axial images were obtained from the base of the skull through the vertex without intravenous contrast. COMPARISON:  None. FINDINGS: Brain: There is no mass, hemorrhage or extra-axial collection. The size and configuration of the ventricles and extra-axial CSF spaces are normal. The brain parenchyma is normal, without evidence of acute or chronic infarction. Vascular: No abnormal hyperdensity of the major intracranial arteries or dural venous sinuses. No intracranial atherosclerosis. Skull: The visualized skull base, calvarium and extracranial soft tissues are normal. Sinuses/Orbits: No fluid levels or advanced mucosal thickening of the visualized paranasal sinuses. No mastoid or middle ear effusion. The orbits are normal. ASPECTS [facility] Stroke Program Early CT Score) - Ganglionic level infarction (caudate, lentiform nuclei, internal capsule, insula, M1-M3 cortex): 7 - Supraganglionic infarction (M4-M6 cortex): 3 Total score (0-10 with  10 being normal): 10 IMPRESSION: 1. Normal head CT. 2. ASPECTS is 10. These results were communicated to Dr. Donnetta Simpers at 9:11 pm on 07/20/2021 by text page via the [facility] messaging system. Electronically Signed   By: Ulyses Jarred M.D.   On: 07/04/2021 21:12       Antibiotics: Anti-infectives (From admission, onward)    Start     Dose/Rate Route Frequency Ordered Stop   07/15/21 1000  remdesivir 100 mg in sodium chloride 0.9 % 100 mL IVPB       See Hyperspace for full Linked Orders Report.   100 mg 200 mL/hr over 30 Minutes Intravenous Daily 07/20/2021 1004 07/19/21 0959   07/18/2021 1100  remdesivir 200 mg in sodium chloride 0.9% 250 mL IVPB       See Hyperspace for full Linked Orders Report.   200 mg 580 mL/hr over 30 Minutes Intravenous Once 07/26/2021 1004 07/06/2021 1203   07/13/21 1015  molnupiravir EUA (LAGEVRIO) capsule 800 mg  Status:  Discontinued        4 capsule Oral 2 times daily 07/13/21 1002 07/29/2021 1004   07/13/21 1000  nirmatrelvir/ritonavir EUA (renal dosing) (PAXLOVID) 2 tablet  Status:  Discontinued        2 tablet Oral 2 times daily 07/13/21 0307 07/13/21 1002         DVT prophylaxis: Apixaban  Code Status: Full code  Family Communication: No family at bedside   Consultants:   Procedures:     Objective    Physical Examination:   General-appears in no acute distress Heart-S1-S2, regular, no murmur auscultated Lungs-clear to auscultation bilaterally, no wheezing or crackles auscultated Abdomen-soft, nontender, no organomegaly Extremities-trace edema in the lower extremities, right lower extremity is warm to touch and tender on palpation Neuro-alert, oriented x3, no focal deficit noted  Status is: Inpatient  Dispo: The patient is from: Home              Anticipated d/c is to: Home              Anticipated d/c date is: 07/18/2021              Patient currently not stable for discharge  Barrier to discharge-ongoing treatment for COVID-19  infection/anasarca  COVID-19 Labs  Recent Labs    07/10/2021 0132 07/15/21 0042 07/15/21 1553 07/16/21 0133  DDIMER 0.93* 1.87*  --  1.87*  FERRITIN  --   --  626*  --   CRP 16.0* 27.1*  --  29.2*    Lab Results  Component Value Date   SARSCOV2NAA POSITIVE (A) 07/13/2021   [place] NEGATIVE 03/27/2021   [place] NEGATIVE 03/12/2021   [place] NEGATIVE 08/27/2020            Recent Results (from the past 240 hour(s))  Resp Panel by RT-PCR (Flu A&B, Covid) Nasopharyngeal Swab     Status: Abnormal   Collection Time: 07/13/21  1:03 AM   Specimen: Nasopharyngeal Swab; Nasopharyngeal(NP) swabs in vial transport medium  Result Value Ref Range Status   SARS Coronavirus 2 by RT PCR POSITIVE (A) NEGATIVE Final    Comment: RESULT CALLED TO, READ BACK BY AND VERIFIED WITH: C COBB,RN@0253  07/13/21 [place] (NOTE) SARS-CoV-2 target nucleic acids are DETECTED.  The SARS-CoV-2 RNA is generally detectable in upper respiratory specimens during the acute phase of infection. Positive results are indicative of the presence of the identified virus, but do not rule out bacterial infection or co-infection with other pathogens not detected by the test. Clinical correlation with patient history and other diagnostic information is necessary to determine patient infection status. The expected result is Negative.  Fact Sheet for Patients: EntrepreneurPulse.com.au  Fact Sheet for Healthcare Providers: IncredibleEmployment.be  This test is not yet approved or cleared by the [place] FDA and  has been authorized for detection and/or diagnosis of SARS-CoV-2 by FDA under an Emergency Use Authorization (EUA).  This EUA will remain in effect (meaning this test can be used)  for the duration of  the COVID-19 declaration under Section 564(b)(1) of the Act, 21 U.S.C. section 360bbb-3(b)(1), unless the authorization is terminated or revoked sooner.      Influenza A by PCR NEGATIVE NEGATIVE Final   Influenza B by PCR NEGATIVE NEGATIVE Final    Comment: (NOTE) The Xpert Xpress SARS-CoV-2/FLU/RSV plus assay is intended as an aid in the diagnosis of influenza from Nasopharyngeal swab specimens and should not be used as a sole basis for treatment. Nasal washings and aspirates are unacceptable for Xpert Xpress SARS-CoV-2/FLU/RSV testing.  Fact Sheet for Patients: EntrepreneurPulse.com.au  Fact Sheet for Healthcare Providers: IncredibleEmployment.be  This test is not yet approved or cleared by the [place] FDA and has been authorized for detection and/or diagnosis of SARS-CoV-2 by FDA under an Emergency Use Authorization (EUA). This EUA will remain in effect (meaning this test can be used) for the duration of the COVID-19 declaration under Section 564(b)(1) of the Act, 21 U.S.C. section 360bbb-3(b)(1), unless the authorization is terminated or revoked.  Performed at [facility], [place] [address], [place], [place] [address]     [facility] If 7PM-7AM, please contact night-coverage at [email or web address], Office  [phone]   07/16/2021, 5:09 PM  LOS: 2 days

## 2021-07-16 NOTE — Progress Notes (Signed)
BLE venous duplex has been completed.  Results can be found under chart review under CV PROC. 07/16/2021 6:04 PM Johnathen Testa RVT, RDMS

## 2021-07-17 ENCOUNTER — Inpatient Hospital Stay (HOSPITAL_COMMUNITY): Payer: Medicare (Managed Care)

## 2021-07-17 DIAGNOSIS — J9601 Acute respiratory failure with hypoxia: Secondary | ICD-10-CM | POA: Diagnosis not present

## 2021-07-17 DIAGNOSIS — U071 COVID-19: Secondary | ICD-10-CM | POA: Diagnosis not present

## 2021-07-17 DIAGNOSIS — E43 Unspecified severe protein-calorie malnutrition: Secondary | ICD-10-CM | POA: Diagnosis not present

## 2021-07-17 DIAGNOSIS — R601 Generalized edema: Secondary | ICD-10-CM | POA: Diagnosis not present

## 2021-07-17 DIAGNOSIS — I3139 Other pericardial effusion (noninflammatory): Secondary | ICD-10-CM

## 2021-07-17 LAB — TYPE AND SCREEN
ABO/RH(D): O POS
Antibody Screen: NEGATIVE

## 2021-07-17 LAB — COMPREHENSIVE METABOLIC PANEL
ALT: 20 U/L (ref 0–44)
ALT: 12 U/L (ref 0–44)
AST: 26 U/L (ref 15–41)
AST: 43 U/L — ABNORMAL HIGH (ref 15–41)
Albumin: <1.5 g/dL — ABNORMAL LOW (ref 3.5–5.0)
Albumin: <1.5 g/dL — ABNORMAL LOW (ref 3.5–5.0)
Alkaline Phosphatase: 48 U/L (ref 38–126)
Alkaline Phosphatase: 43 U/L (ref 38–126)
Anion gap: 18 — ABNORMAL HIGH (ref 5–15)
BUN: 23 mg/dL (ref 8–23)
BUN: 22 mg/dL (ref 8–23)
CO2: 11 mmol/L — ABNORMAL LOW (ref 22–32)
CO2: <7 mmol/L — ABNORMAL LOW (ref 22–32)
Calcium: 6.8 mg/dL — ABNORMAL LOW (ref 8.9–10.3)
Calcium: 6.2 mg/dL — CL (ref 8.9–10.3)
Chloride: 100 mmol/L (ref 98–111)
Chloride: 104 mmol/L (ref 98–111)
Creatinine, Ser: 1.52 mg/dL — ABNORMAL HIGH (ref 0.44–1.00)
Creatinine, Ser: 1.53 mg/dL — ABNORMAL HIGH (ref 0.44–1.00)
GFR, Estimated: 39 mL/min — ABNORMAL LOW (ref >60)
GFR, Estimated: 38 mL/min — ABNORMAL LOW (ref >60)
Glucose, Bld: 89 mg/dL (ref 70–99)
Glucose, Bld: 375 mg/dL — ABNORMAL HIGH (ref 70–99)
Potassium: 3.8 mmol/L (ref 3.5–5.1)
Potassium: 4.8 mmol/L (ref 3.5–5.1)
Sodium: 129 mmol/L — ABNORMAL LOW (ref 135–145)
Sodium: 135 mmol/L (ref 135–145)
Total Bilirubin: 0.8 mg/dL (ref 0.3–1.2)
Total Bilirubin: 0.3 mg/dL (ref 0.3–1.2)
Total Protein: <3 g/dL — ABNORMAL LOW (ref 6.5–8.1)
Total Protein: <3 g/dL — ABNORMAL LOW (ref 6.5–8.1)

## 2021-07-17 LAB — CBC WITH DIFFERENTIAL/PLATELET
Abs Immature Granulocytes: 1.75 10*3/uL — ABNORMAL HIGH (ref 0.00–0.07)
Basophils Absolute: 0 10*3/uL (ref 0.0–0.1)
Basophils Relative: 1 %
Eosinophils Absolute: 0 10*3/uL (ref 0.0–0.5)
Eosinophils Relative: 1 %
HCT: 28.4 % — ABNORMAL LOW (ref 36.0–46.0)
Hemoglobin: 9.3 g/dL — ABNORMAL LOW (ref 12.0–15.0)
Immature Granulocytes: 56 %
Lymphocytes Relative: 6 %
Lymphs Abs: 0.2 10*3/uL — ABNORMAL LOW (ref 0.7–4.0)
MCH: 32.4 pg (ref 26.0–34.0)
MCHC: 32.7 g/dL (ref 30.0–36.0)
MCV: 99 fL (ref 80.0–100.0)
Monocytes Absolute: 0.2 10*3/uL (ref 0.1–1.0)
Monocytes Relative: 5 %
Neutro Abs: 1 10*3/uL — ABNORMAL LOW (ref 1.7–7.7)
Neutrophils Relative %: 31 %
Platelets: 45 10*3/uL — ABNORMAL LOW (ref 150–400)
RBC: 2.87 MIL/uL — ABNORMAL LOW (ref 3.87–5.11)
RDW: 21 % — ABNORMAL HIGH (ref 11.5–15.5)
Smear Review: DECREASED
WBC: 3.1 10*3/uL — ABNORMAL LOW (ref 4.0–10.5)
nRBC: 1.6 % — ABNORMAL HIGH (ref 0.0–0.2)

## 2021-07-17 LAB — HEMOGLOBIN AND HEMATOCRIT, BLOOD
HCT: 7.9 % — ABNORMAL LOW (ref 36.0–46.0)
Hemoglobin: 2.1 g/dL — CL (ref 12.0–15.0)

## 2021-07-17 LAB — GLUCOSE, CAPILLARY
Glucose-Capillary: 85 mg/dL (ref 70–99)
Glucose-Capillary: 91 mg/dL (ref 70–99)
Glucose-Capillary: 82 mg/dL (ref 70–99)
Glucose-Capillary: 42 mg/dL — CL (ref 70–99)
Glucose-Capillary: 170 mg/dL — ABNORMAL HIGH (ref 70–99)

## 2021-07-17 LAB — URINALYSIS, ROUTINE W REFLEX MICROSCOPIC
Bilirubin Urine: NEGATIVE
Glucose, UA: NEGATIVE mg/dL
Ketones, ur: NEGATIVE mg/dL
Nitrite: POSITIVE — AB
Protein, ur: 30 mg/dL — AB
Specific Gravity, Urine: 1.01 (ref 1.005–1.030)
pH: 6 (ref 5.0–8.0)

## 2021-07-17 LAB — PROCALCITONIN: Procalcitonin: 3.73 ng/mL

## 2021-07-17 LAB — ECHOCARDIOGRAM LIMITED
Area-P 1/2: 4.29 cm2
Height: 62 in
S' Lateral: 3.2 cm
Weight: 2194.02 oz

## 2021-07-17 LAB — ZINC: Zinc: 18 ug/dL — ABNORMAL LOW (ref 44–115)

## 2021-07-17 LAB — COPPER, SERUM: Copper: 27 ug/dL — ABNORMAL LOW (ref 80–158)

## 2021-07-17 LAB — URINALYSIS, MICROSCOPIC (REFLEX)
RBC / HPF: >50 RBC/hpf (ref 0–5)
Squamous Epithelial / HPF: NONE SEEN (ref 0–5)

## 2021-07-17 LAB — MAGNESIUM
Magnesium: 1.5 mg/dL — ABNORMAL LOW (ref 1.7–2.4)
Magnesium: 2.4 mg/dL (ref 1.7–2.4)

## 2021-07-17 LAB — LACTATE DEHYDROGENASE: LDH: 340 U/L — ABNORMAL HIGH (ref 98–192)

## 2021-07-17 LAB — PHOSPHORUS
Phosphorus: 1.7 mg/dL — ABNORMAL LOW (ref 2.5–4.6)
Phosphorus: 5.3 mg/dL — ABNORMAL HIGH (ref 2.5–4.6)

## 2021-07-17 LAB — MRSA NEXT GEN BY PCR, NASAL: MRSA by PCR Next Gen: NOT DETECTED

## 2021-07-17 LAB — C-REACTIVE PROTEIN: CRP: 22.7 mg/dL — ABNORMAL HIGH (ref <1.0)

## 2021-07-17 LAB — BETA-HYDROXYBUTYRIC ACID: Beta-Hydroxybutyric Acid: 0.07 mmol/L (ref 0.05–0.27)

## 2021-07-17 LAB — LACTIC ACID, PLASMA
Lactic Acid, Venous: >9 mmol/L (ref 0.5–1.9)
Lactic Acid, Venous: >9 mmol/L (ref 0.5–1.9)

## 2021-07-17 LAB — D-DIMER, QUANTITATIVE: D-Dimer, Quant: 3.73 ug/mL-FEU — ABNORMAL HIGH (ref 0.00–0.50)

## 2021-07-17 MED ORDER — FENTANYL CITRATE PF 50 MCG/ML IJ SOSY: 50.0000 ug | PREFILLED_SYRINGE | INTRAMUSCULAR | Status: DC | PRN

## 2021-07-17 MED ORDER — SODIUM CHLORIDE 0.9 % IV SOLN: INTRAVENOUS | Status: DC | PRN

## 2021-07-17 MED ORDER — HEPARIN SODIUM (PORCINE) 5000 UNIT/ML IJ SOLN: 5000.0000 [IU] | Freq: Three times a day (TID) | INTRAMUSCULAR | Status: DC

## 2021-07-17 MED ORDER — SODIUM CHLORIDE 0.9 % IV SOLN
100.0000 mg | Freq: Every day | INTRAVENOUS | Status: DC
  Administered 2021-07-17: 100 mg via INTRAVENOUS
  Filled 2021-07-17: qty 20

## 2021-07-17 MED ORDER — POLYETHYLENE GLYCOL 3350 17 G PO PACK: 17.0000 g | PACK | Freq: Every day | ORAL | Status: DC

## 2021-07-17 MED ORDER — SODIUM PHOSPHATES 45 MMOLE/15ML IV SOLN
30.0000 mmol | Freq: Once | INTRAVENOUS | Status: AC
  Administered 2021-07-17: 30 mmol via INTRAVENOUS
  Filled 2021-07-17: qty 10

## 2021-07-17 MED ORDER — VASOPRESSIN 20 UNITS/100 ML INFUSION FOR SHOCK
0.0000 [IU]/min | INTRAVENOUS | Status: DC
  Administered 2021-07-17: 0.04 [IU]/min via INTRAVENOUS

## 2021-07-17 MED ORDER — DEXTROSE 50 % IV SOLN
INTRAVENOUS | Status: AC
  Administered 2021-07-17: 25 g via INTRAVENOUS
  Filled 2021-07-17: qty 50

## 2021-07-17 MED ORDER — SODIUM BICARBONATE 8.4 % IV SOLN
INTRAVENOUS | Status: DC
  Filled 2021-07-17 (×2): qty 1000

## 2021-07-17 MED ORDER — CHLORHEXIDINE GLUCONATE 0.12% ORAL RINSE (MEDLINE KIT)
15.0000 mL | Freq: Two times a day (BID) | OROMUCOSAL | Status: DC
  Administered 2021-07-17: 15 mL via OROMUCOSAL

## 2021-07-17 MED ORDER — SODIUM CHLORIDE 0.9 % IV SOLN
250.0000 mL | INTRAVENOUS | Status: DC
  Administered 2021-07-17: 250 mL via INTRAVENOUS

## 2021-07-17 MED ORDER — STERILE WATER FOR INJECTION IV SOLN
INTRAVENOUS | Status: DC
  Filled 2021-07-17: qty 1000

## 2021-07-17 MED ORDER — PROPOFOL 1000 MG/100ML IV EMUL
0.0000 ug/kg/min | INTRAVENOUS | Status: DC
Start: 2021-07-17 — End: 2021-07-18
  Filled 2021-07-17: qty 100

## 2021-07-17 MED ORDER — ZINC SULFATE 220 (50 ZN) MG PO CAPS
220.0000 mg | ORAL_CAPSULE | Freq: Two times a day (BID) | ORAL | Status: DC
  Administered 2021-07-17: 220 mg via ORAL
  Filled 2021-07-17: qty 1

## 2021-07-17 MED ORDER — THIAMINE HCL 100 MG/ML IJ SOLN
500.0000 mg | INTRAVENOUS | Status: DC
  Filled 2021-07-17: qty 5

## 2021-07-17 MED ORDER — FENTANYL 2500MCG IN NS 250ML (10MCG/ML) PREMIX INFUSION: 0.0000 ug/h | INTRAVENOUS | Status: DC

## 2021-07-17 MED ORDER — CYANOCOBALAMIN 1000 MCG/ML IJ SOLN
1000.0000 ug | Freq: Every day | INTRAMUSCULAR | Status: DC
  Administered 2021-07-17: 1000 ug via INTRAMUSCULAR
  Filled 2021-07-17 (×2): qty 1

## 2021-07-17 MED ORDER — EPINEPHRINE HCL 5 MG/250ML IV SOLN IN NS
0.5000 ug/min | INTRAVENOUS | Status: DC
  Administered 2021-07-17: 20 ug/min via INTRAVENOUS
  Filled 2021-07-17: qty 250

## 2021-07-17 MED ORDER — DEXTROSE 50 % IV SOLN
12.5000 g | INTRAVENOUS | Status: AC
  Administered 2021-07-17: 12.5 g via INTRAVENOUS
  Filled 2021-07-17: qty 50

## 2021-07-17 MED ORDER — LACTATED RINGERS IV BOLUS
1000.0000 mL | Freq: Once | INTRAVENOUS | Status: AC
  Administered 2021-07-17: 1000 mL via INTRAVENOUS

## 2021-07-17 MED ORDER — SODIUM CHLORIDE 0.9 % IV SOLN
2.0000 g | Freq: Two times a day (BID) | INTRAVENOUS | Status: DC
  Administered 2021-07-17: 2 g via INTRAVENOUS
  Filled 2021-07-17: qty 2

## 2021-07-17 MED ORDER — ENOXAPARIN SODIUM 60 MG/0.6ML IJ SOSY: 60.0000 mg | PREFILLED_SYRINGE | Freq: Two times a day (BID) | INTRAMUSCULAR | Status: DC

## 2021-07-17 MED ORDER — NOREPINEPHRINE 4 MG/250ML-% IV SOLN
2.0000 ug/min | INTRAVENOUS | Status: DC
  Administered 2021-07-17: 40 ug/min via INTRAVENOUS
  Administered 2021-07-17: 10 ug/min via INTRAVENOUS
  Administered 2021-07-17: 40 ug/min via INTRAVENOUS
  Filled 2021-07-17 (×3): qty 250

## 2021-07-17 MED ORDER — NALOXONE HCL 0.4 MG/ML IJ SOLN
0.4000 mg | INTRAMUSCULAR | Status: DC | PRN
  Administered 2021-07-17: 0.4 mg via INTRAVENOUS
  Filled 2021-07-17: qty 1

## 2021-07-17 MED ORDER — VANCOMYCIN HCL IN DEXTROSE 1-5 GM/200ML-% IV SOLN
1000.0000 mg | Freq: Once | INTRAVENOUS | Status: DC
  Filled 2021-07-17: qty 200

## 2021-07-17 MED ORDER — NOREPINEPHRINE 4 MG/250ML-% IV SOLN: 0.0000 ug/min | INTRAVENOUS | Status: DC

## 2021-07-17 MED ORDER — DOCUSATE SODIUM 50 MG/5ML PO LIQD: 100.0000 mg | Freq: Two times a day (BID) | ORAL | Status: DC

## 2021-07-17 MED ORDER — DEXTROSE 50 % IV SOLN: 25.0000 g | INTRAVENOUS | Status: AC

## 2021-07-17 MED ORDER — MAGNESIUM SULFATE 2 GM/50ML IV SOLN
2.0000 g | Freq: Once | INTRAVENOUS | Status: AC
  Administered 2021-07-17: 2 g via INTRAVENOUS
  Filled 2021-07-17: qty 50

## 2021-07-17 MED ORDER — EPINEPHRINE HCL 5 MG/250ML IV SOLN IN NS
INTRAVENOUS | Status: AC
  Administered 2021-07-17: 20 ug/min via INTRAVENOUS
  Filled 2021-07-17: qty 250

## 2021-07-17 MED ORDER — CHLORHEXIDINE GLUCONATE CLOTH 2 % EX PADS: 6.0000 | MEDICATED_PAD | Freq: Every day | CUTANEOUS | Status: DC

## 2021-07-17 MED ORDER — VITAL 1.5 CAL PO LIQD: 1000.0000 mL | ORAL | Status: DC

## 2021-07-17 MED ORDER — CYANOCOBALAMIN 1000 MCG/ML IJ SOLN
1000.0000 ug | INTRAMUSCULAR | Status: DC
Start: 2021-07-24 — End: 2021-07-17

## 2021-07-17 MED ORDER — ORAL CARE MOUTH RINSE
15.0000 mL | OROMUCOSAL | Status: DC
  Administered 2021-07-17 (×3): 15 mL via OROMUCOSAL

## 2021-07-18 LAB — BLOOD CULTURE ID PANEL (REFLEXED) - BCID2
A.calcoaceticus-baumannii: NOT DETECTED
Bacteroides fragilis: NOT DETECTED
Candida albicans: NOT DETECTED
Candida auris: NOT DETECTED
Candida glabrata: NOT DETECTED
Candida krusei: NOT DETECTED
Candida parapsilosis: NOT DETECTED
Candida tropicalis: NOT DETECTED
Cryptococcus neoformans/gattii: NOT DETECTED
Enterobacter cloacae complex: NOT DETECTED
Enterobacterales: NOT DETECTED
Enterococcus Faecium: NOT DETECTED
Enterococcus faecalis: NOT DETECTED
Escherichia coli: NOT DETECTED
Haemophilus influenzae: NOT DETECTED
Klebsiella aerogenes: NOT DETECTED
Klebsiella oxytoca: NOT DETECTED
Klebsiella pneumoniae: NOT DETECTED
Listeria monocytogenes: NOT DETECTED
Meth resistant mecA/C and MREJ: NOT DETECTED
Neisseria meningitidis: NOT DETECTED
Proteus species: NOT DETECTED
Pseudomonas aeruginosa: NOT DETECTED
Salmonella species: NOT DETECTED
Serratia marcescens: NOT DETECTED
Staphylococcus aureus (BCID): DETECTED — AB
Staphylococcus epidermidis: NOT DETECTED
Staphylococcus lugdunensis: NOT DETECTED
Staphylococcus species: DETECTED — AB
Stenotrophomonas maltophilia: NOT DETECTED
Streptococcus agalactiae: NOT DETECTED
Streptococcus pneumoniae: NOT DETECTED
Streptococcus pyogenes: DETECTED — AB
Streptococcus species: DETECTED — AB

## 2021-07-19 LAB — VITAMIN B1: Vitamin B1 (Thiamine): 65.3 nmol/L — ABNORMAL LOW (ref 66.5–200.0)

## 2021-07-19 LAB — VITAMIN C: Vitamin C: 0.6 mg/dL (ref 0.4–2.0)

## 2021-07-21 LAB — URINE CULTURE: Culture: >=100000 — AB

## 2021-07-22 LAB — CULTURE, BLOOD (ROUTINE X 2): Culture: NO GROWTH

## 2021-07-24 LAB — CULTURE, BLOOD (ROUTINE X 2): Special Requests: ADEQUATE

## 2021-07-28 LAB — VITAMIN A: Vitamin A (Retinoic Acid): <2.5 ug/dL — ABNORMAL LOW (ref 22.0–69.5)

## 2021-07-29 IMAGING — CT CT HEAD W/O CM
3 of 4 series · 15 of 47 positions shown, 18 images · non-contrast
Comparison: Head CT dated 06/29/2020.

CLINICAL DATA: 58-year-old female with delirium.

EXAM:
CT HEAD WITHOUT CONTRAST
TECHNIQUE: Contiguous axial images were obtained from the base of the skull
through the vertex without intravenous contrast.

[Series 4: head 2.0 h70h · axial · 0.43mm/px · z∈[-127,-9]mm · 9 of 75 slices shown, 12 images]
[im 8/75  brain]
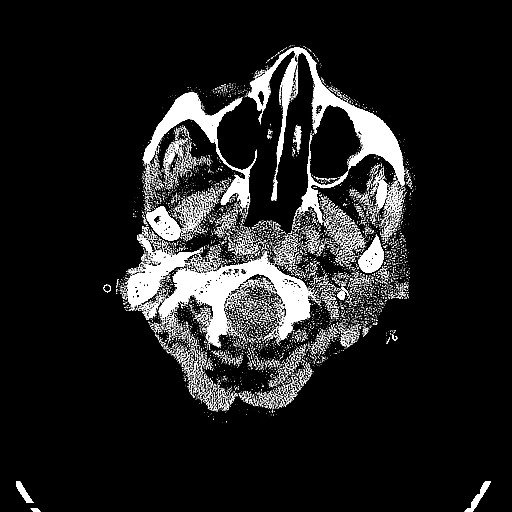
[im 8/75  bone]
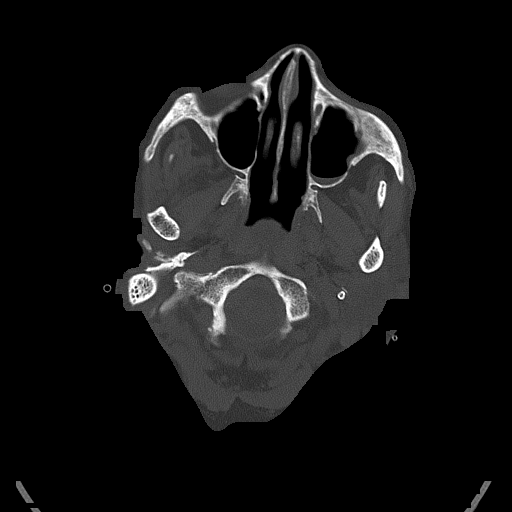
[im 15/75  brain]
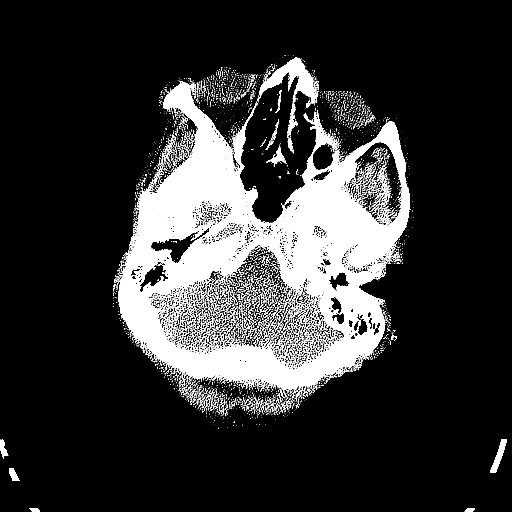
[im 23/75  brain]
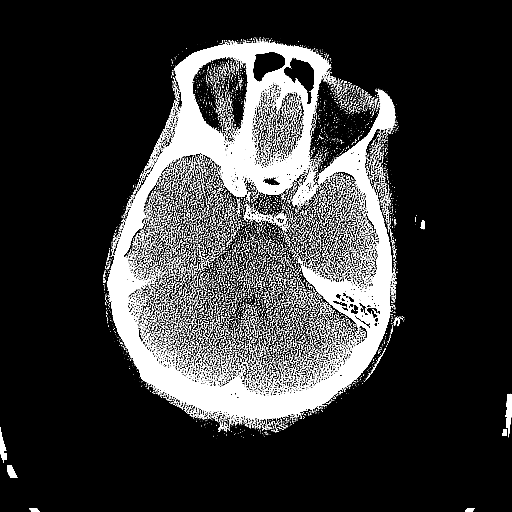
[im 30/75  brain]
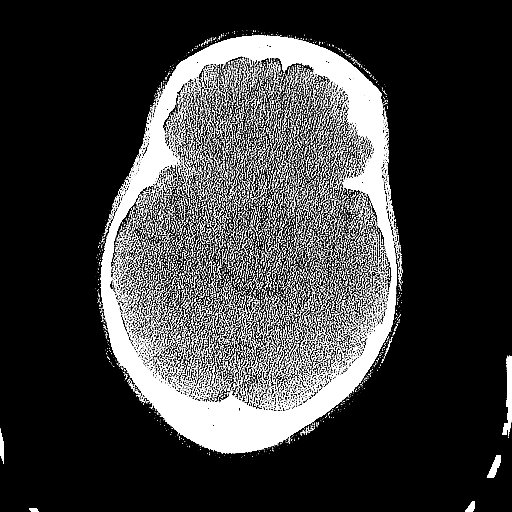
[im 38/75  brain]
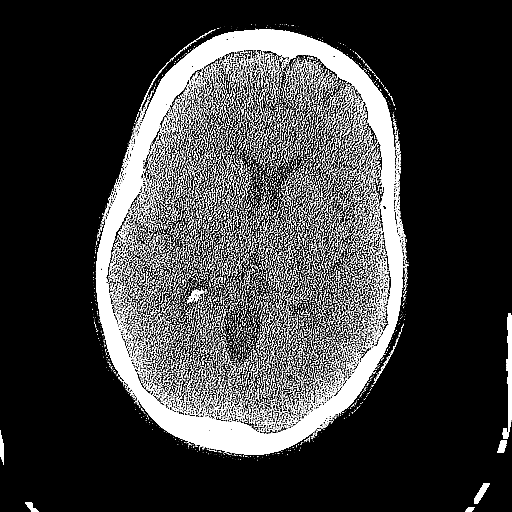
[im 38/75  bone]
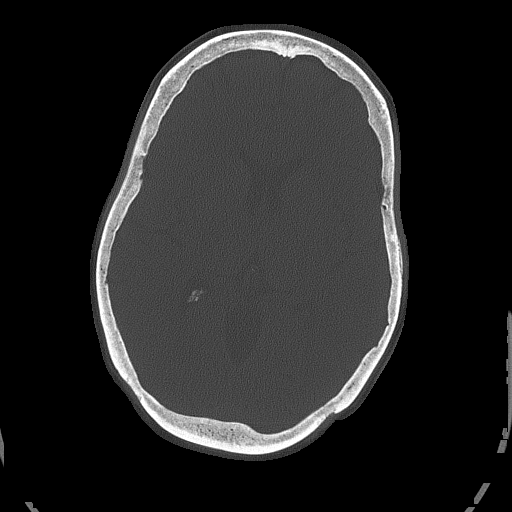
[im 45/75  brain]
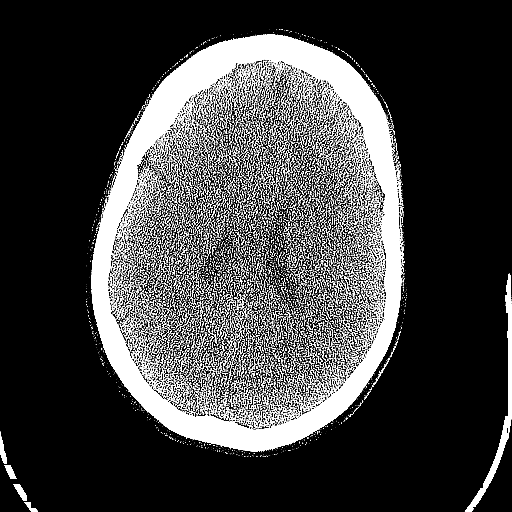
[im 52/75  brain]
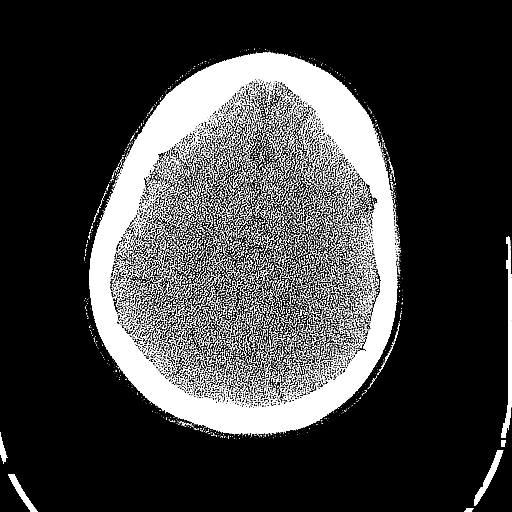
[im 60/75  brain]
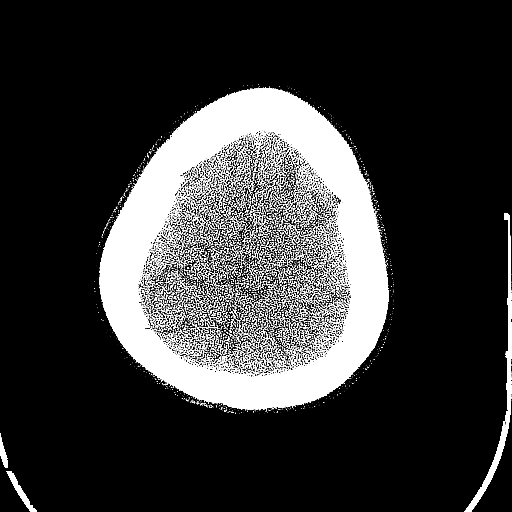
[im 67/75  brain]
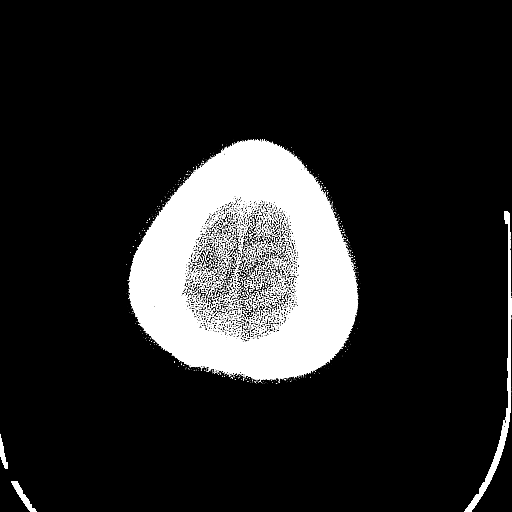
[im 67/75  bone]
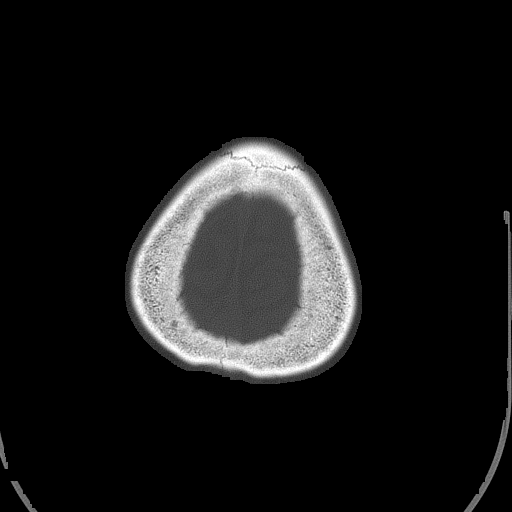

[Series 5: head 3.0 mpr cor · coronal · 0.33mm/px · 3 of 71 slices shown]
[im 24/71  brain]
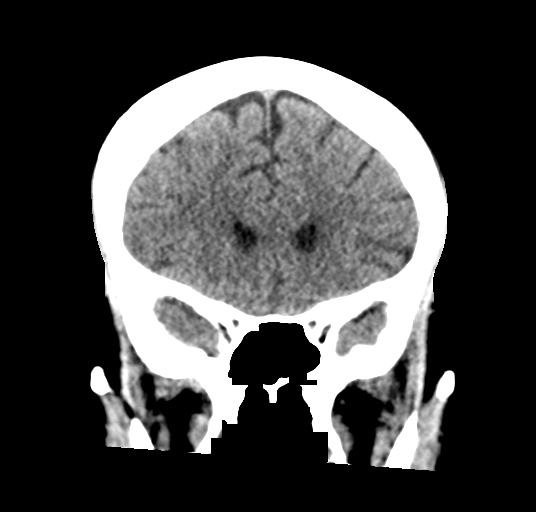
[im 32/71  brain]
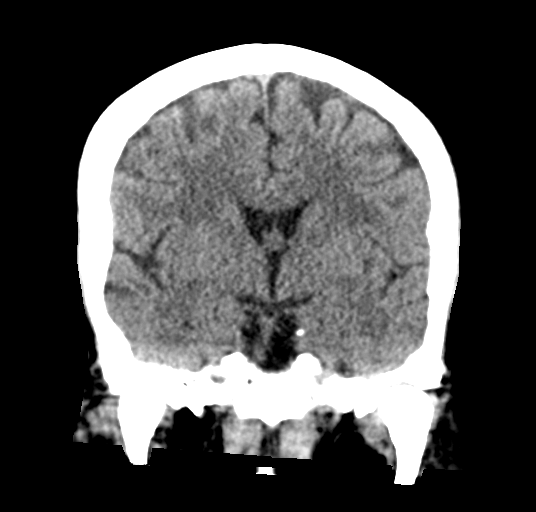
[im 39/71  brain]
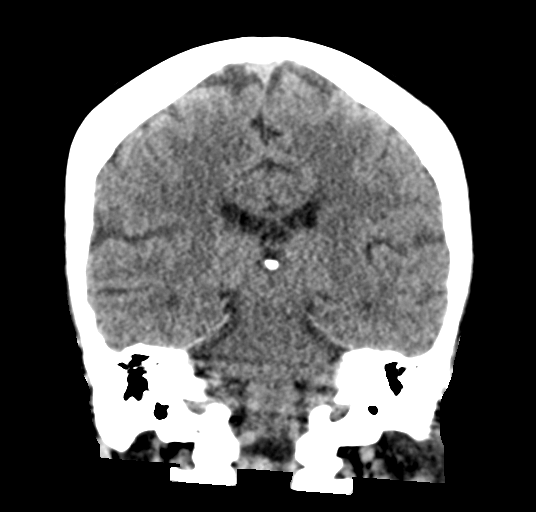

[Series 6: head 3.0 mpr sag · sagittal · 0.34mm/px · 3 of 67 slices shown]
[im 23/67  brain]
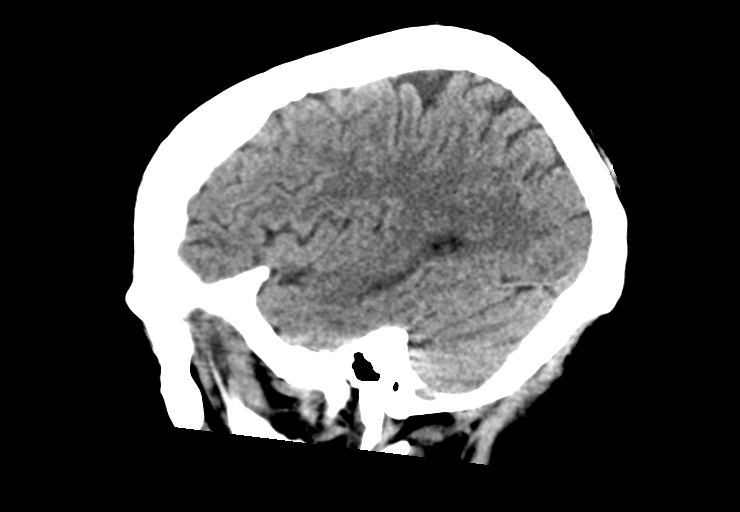
[im 34/67  brain]
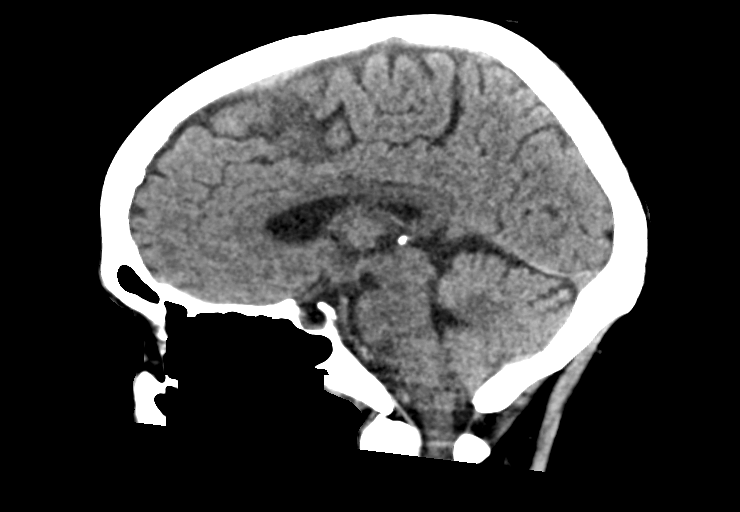
[im 45/67  brain]
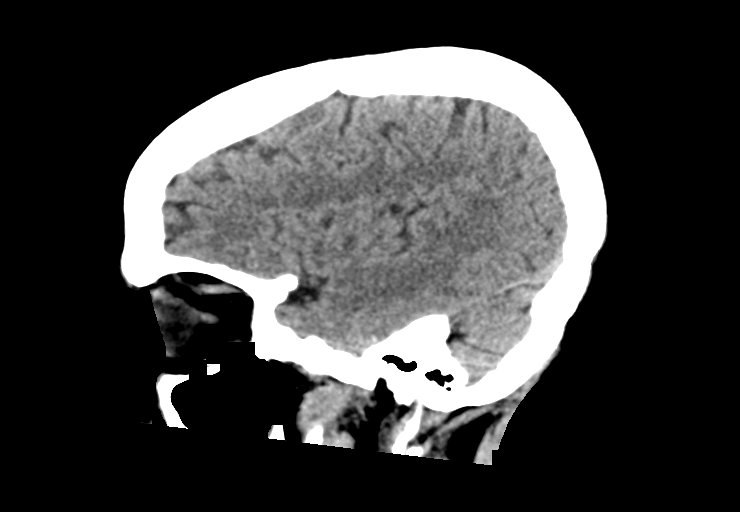

[15 of 47 positions shown; findings below may reference images not displayed]

FINDINGS: Brain: The ventricles and sulci appropriate size for patient's age.
The gray-white matter discrimination is preserved. There is no acute
intracranial hemorrhage. No mass effect or midline shift. No
extra-axial fluid collection.

Vascular: No hyperdense vessel or unexpected calcification.

Skull: Normal. Negative for fracture or focal lesion.

Sinuses/Orbits: Mild mucoperiosteal thickening of the left maxillary
sinus. The remainder of the visualized paranasal sinuses and mastoid
air cells are clear.

Other: None
IMPRESSION: Unremarkable noncontrast CT of the brain.

## 2021-07-29 IMAGING — CT CT ABD-PELV W/O CM
2 of 4 series · 17 of 46 positions shown, 19 images · non-contrast
Comparison: 11/18/2019

CLINICAL DATA: Altered level of consciousness, unresponsive, acute
abdominal pain

EXAM:
CT ABDOMEN AND PELVIS WITHOUT CONTRAST
TECHNIQUE: Multidetector CT imaging of the abdomen and pelvis was performed
following the standard protocol without IV contrast.

[Series 3: ap without · axial · non-contrast · 0.72mm/px · z∈[-393,-18]mm · 14 of 85 slices shown, 16 images]
[im 5/85  soft-tissue]
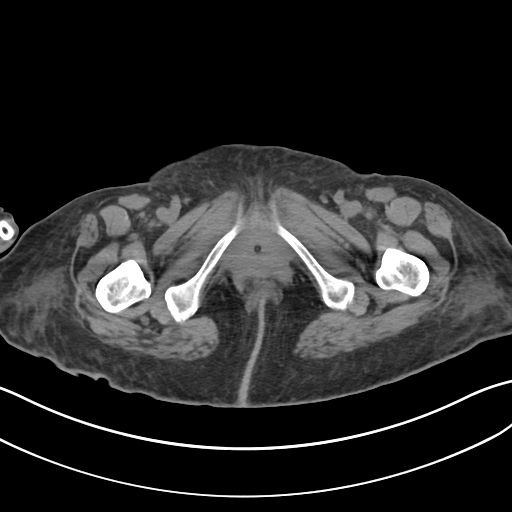
[im 5/85  bone]
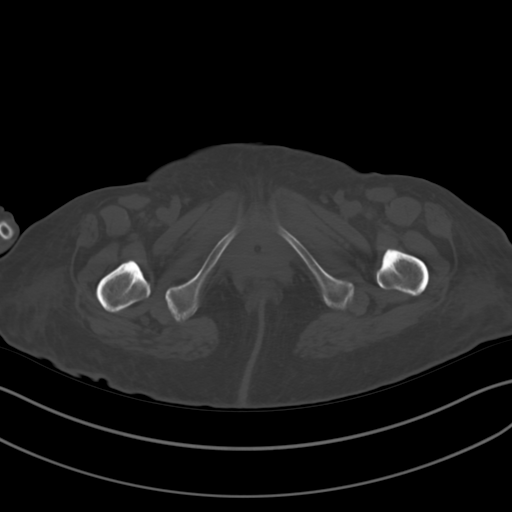
[im 9/85  soft-tissue]
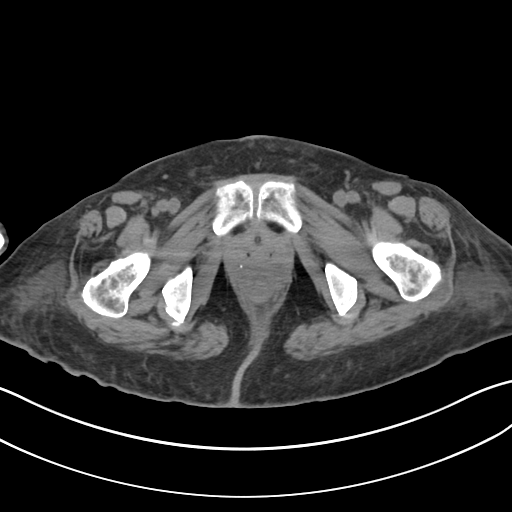
[im 18/85  soft-tissue]
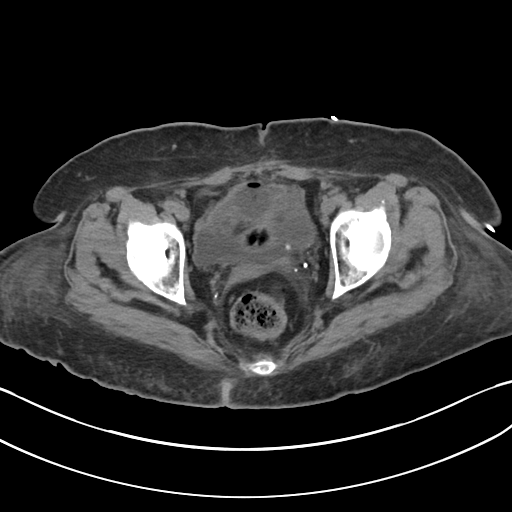
[im 23/85  soft-tissue]
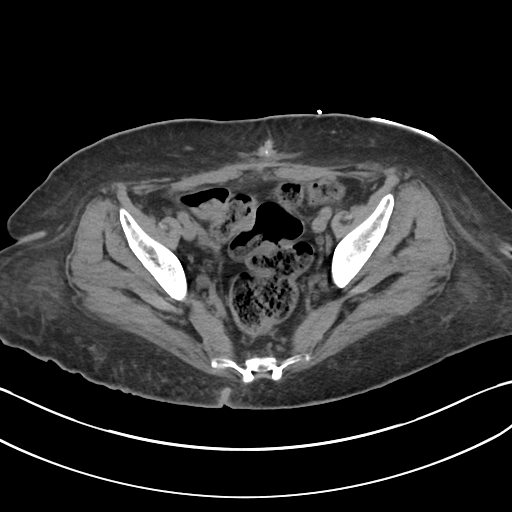
[im 27/85  soft-tissue]
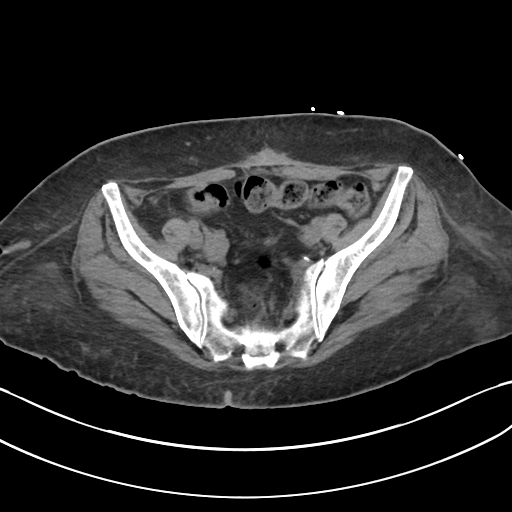
[im 36/85  soft-tissue]
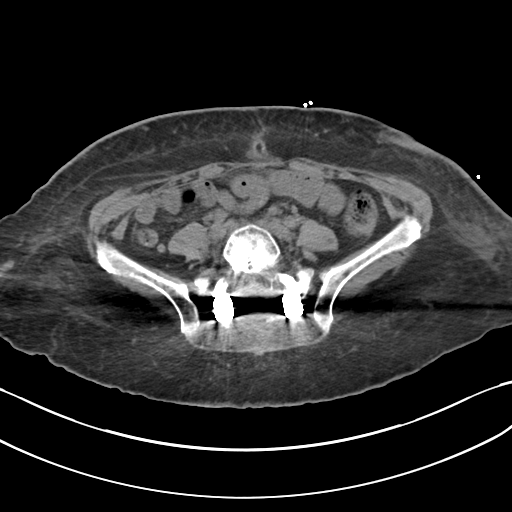
[im 40/85  soft-tissue]
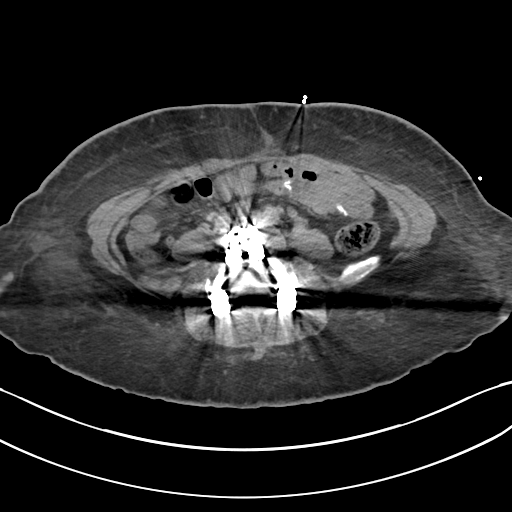
[im 45/85  soft-tissue]
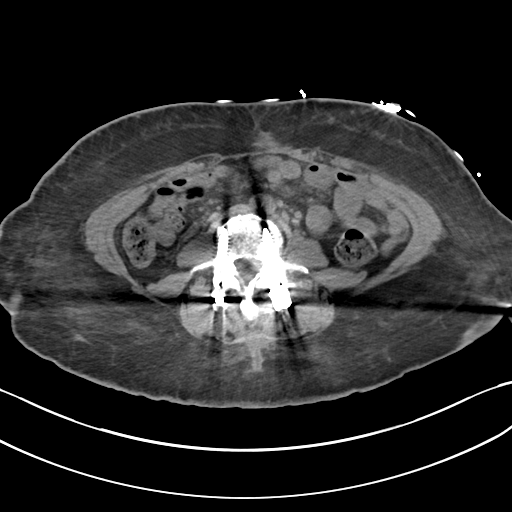
[im 49/85  soft-tissue]
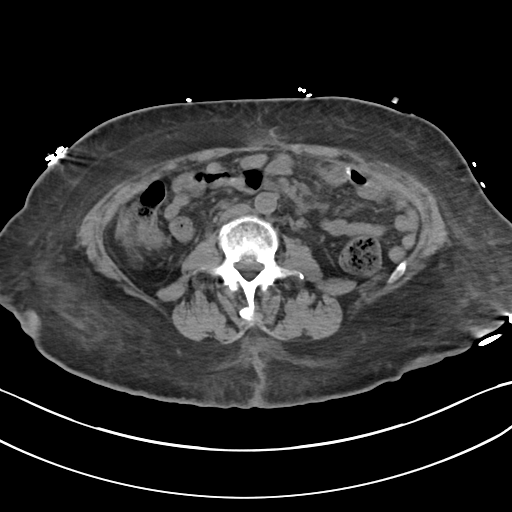
[im 49/85  bone]
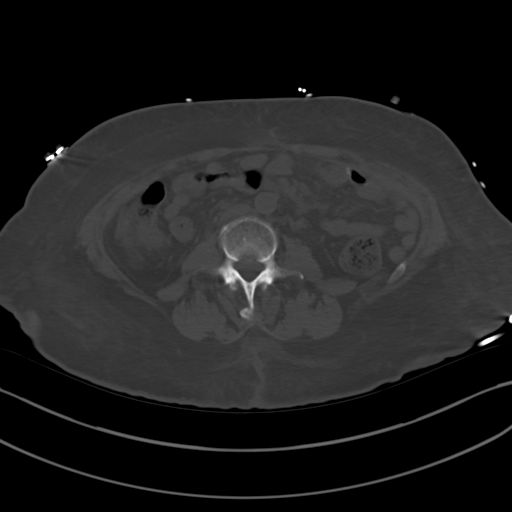
[im 58/85  soft-tissue]
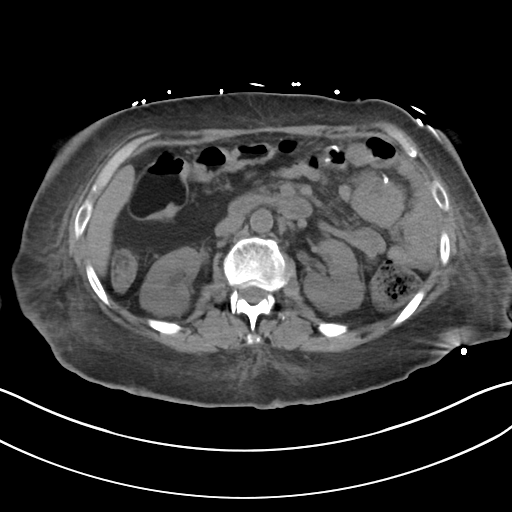
[im 62/85  soft-tissue]
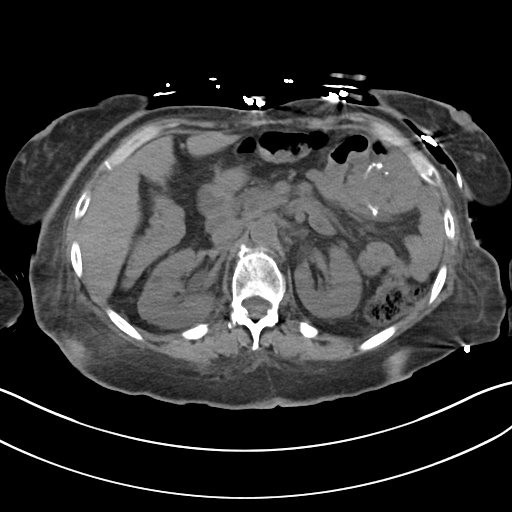
[im 67/85  soft-tissue]
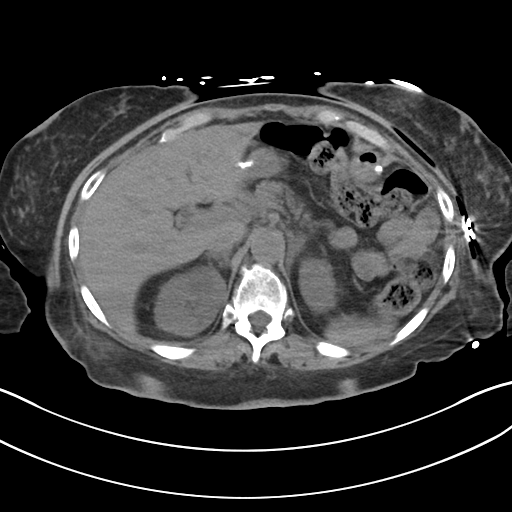
[im 76/85  soft-tissue]
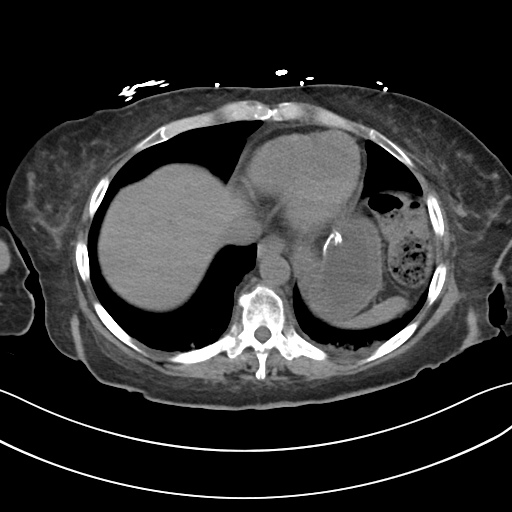
[im 80/85  soft-tissue]
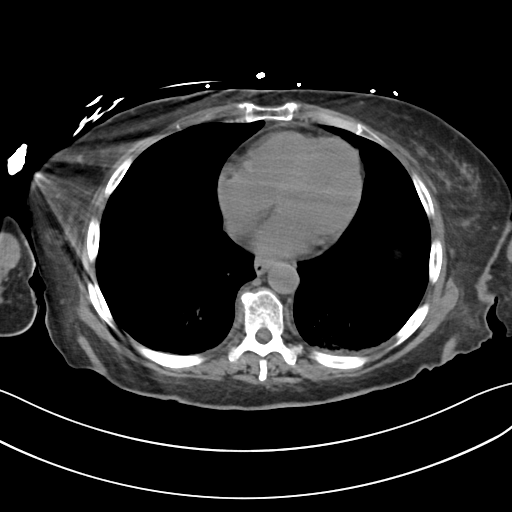

[Series 6: cor · coronal · 0.76mm/px · 3 of 80 slices shown]
[im 27/80  soft-tissue]
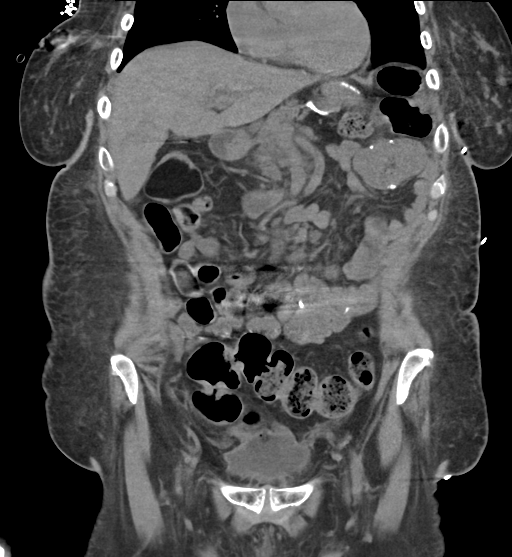
[im 36/80  soft-tissue]
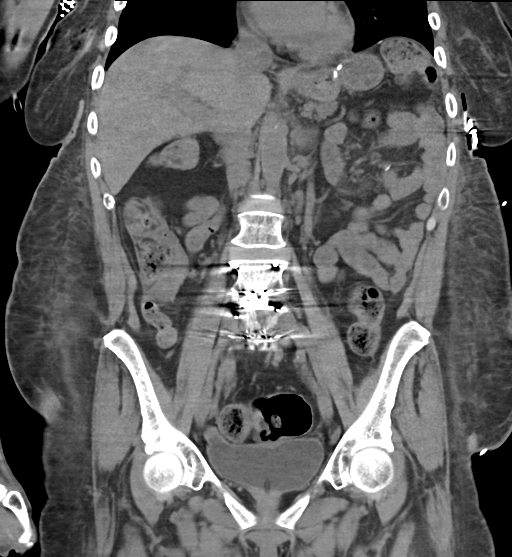
[im 44/80  soft-tissue]
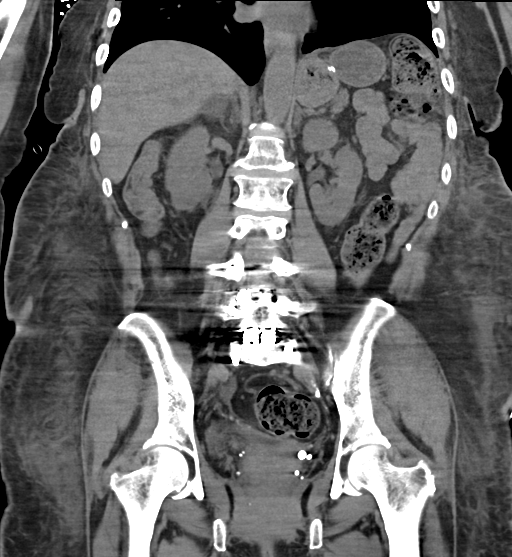

[17 of 46 positions shown; findings below may reference images not displayed]

FINDINGS: Lower chest: No acute pleural or parenchymal lung disease.

Hepatobiliary: No focal liver abnormality is seen. Status post
cholecystectomy. No biliary dilatation.

Pancreas: Unremarkable. No pancreatic ductal dilatation or
surrounding inflammatory changes.

Spleen: Normal in size without focal abnormality.

Adrenals/Urinary Tract: Stable right renal cyst. No urinary tract
calculi or obstructive uropathy. Foley catheter partially
decompresses the bladder. The adrenals are unremarkable.

Stomach/Bowel: No bowel obstruction or ileus. Normal appendix right
lower quadrant. Postsurgical changes from bariatric surgery.

Vascular/Lymphatic: No significant vascular findings are present. No
enlarged abdominal or pelvic lymph nodes.

Reproductive: Status post hysterectomy. No adnexal masses.

Other: No free fluid or free gas.  No abdominal wall hernia.

Musculoskeletal: No acute or destructive bony lesions. Stable
postsurgical changes from L4 through S1. Reconstructed images
demonstrate no additional findings.
IMPRESSION: 1. No acute intra-abdominal or intrapelvic process.

## 2021-07-29 IMAGING — DX DG CHEST 1V PORT
1 series · 1 of 1 positions shown · non-contrast
Comparison: 05/03/2020

CLINICAL DATA: Altered mental status.

EXAM:
PORTABLE CHEST 1 VIEW

[chest ap]
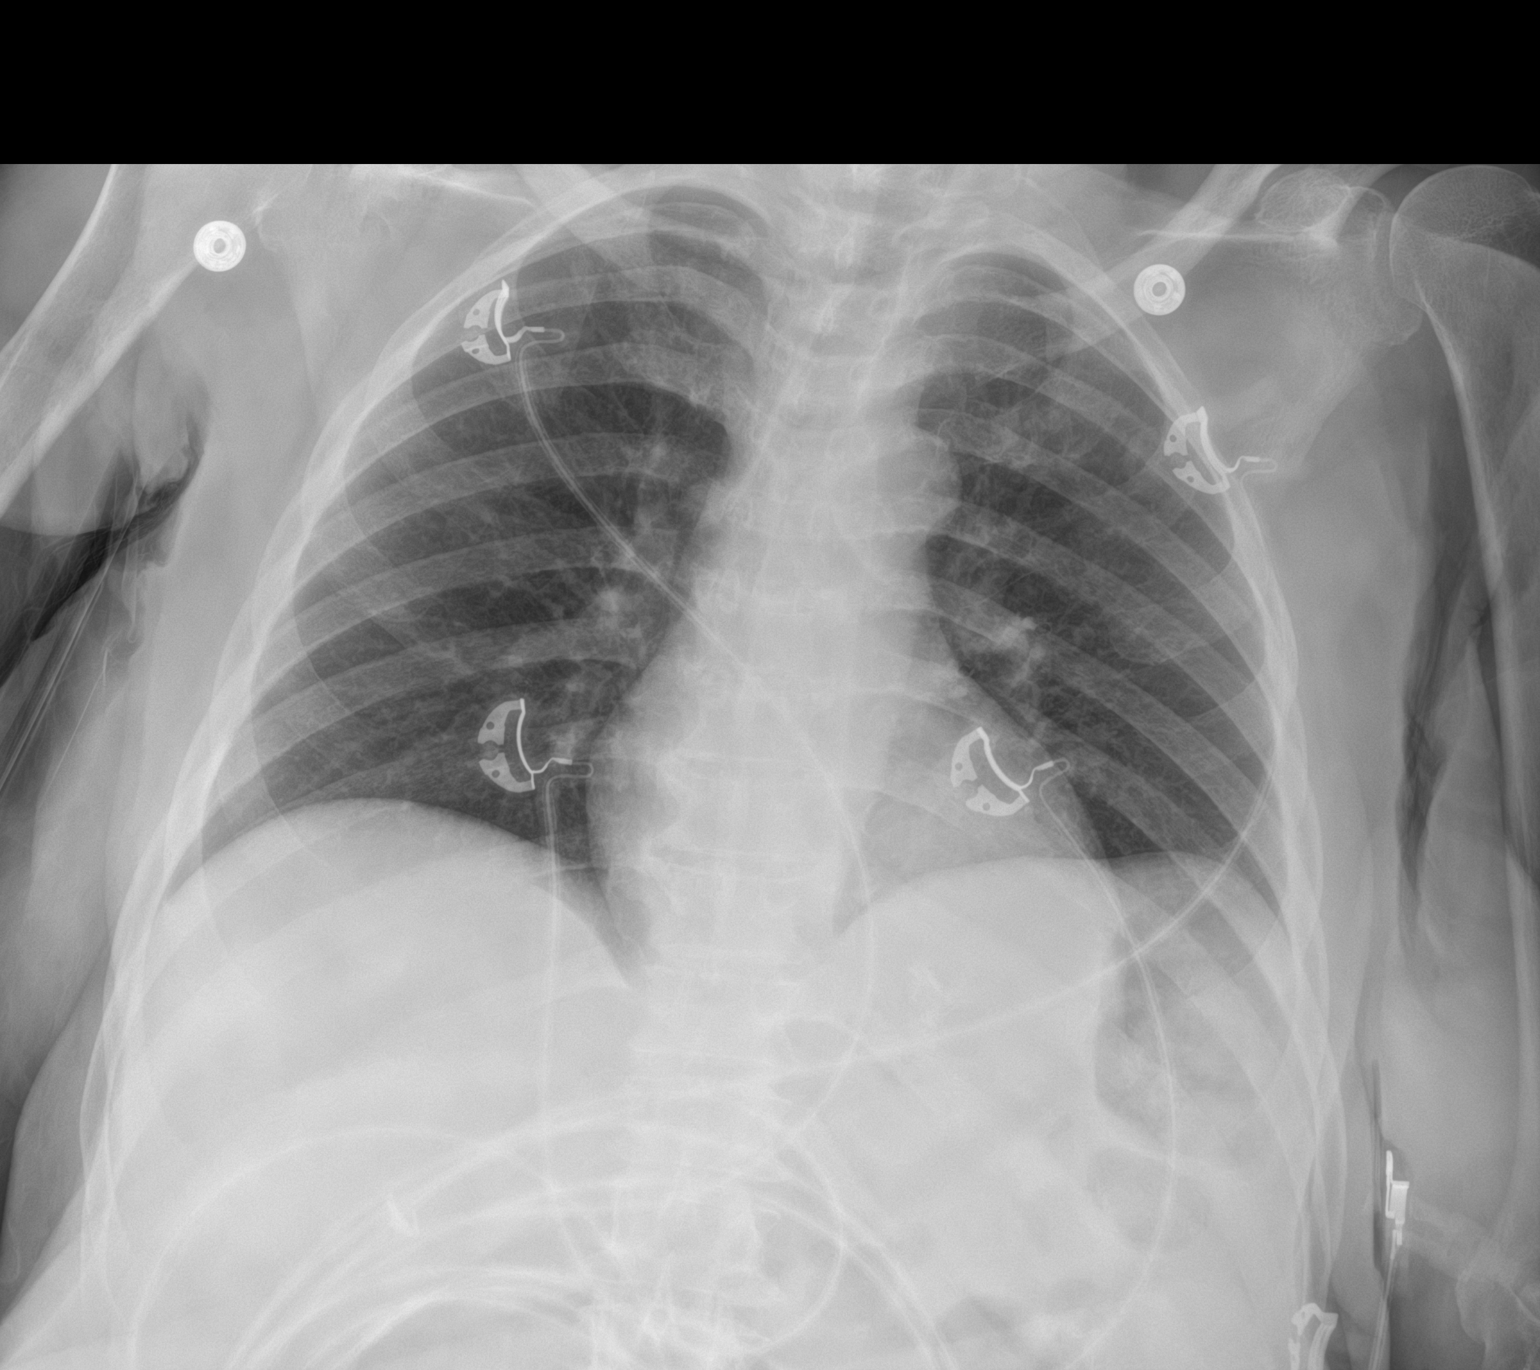

[1 of 1 positions shown; findings below may reference images not displayed]

FINDINGS: 0244 hours. Low volume film. The lungs are clear without focal
pneumonia, edema, pneumothorax or pleural effusion. Interstitial
markings are diffusely coarsened with chronic features. The
cardiopericardial silhouette is within normal limits for size. The
visualized bony structures of the thorax show no acute abnormality.
Telemetry leads overlie the chest.
IMPRESSION: Chronic interstitial changes without acute cardiopulmonary findings.

## 2021-07-30 IMAGING — CT CT ANGIO CHEST
2 of 6 series · 19 of 36 positions shown · IV contrast (omnipaque)
Comparison: 11/18/2019

CLINICAL DATA: Weakness, dizziness, elevated D-dimer, question
pulmonary embolism

EXAM:
CT ANGIOGRAPHY CHEST WITH CONTRAST
TECHNIQUE: Multidetector CT imaging of the chest was performed using the
standard protocol during bolus administration of intravenous
contrast. Multiplanar CT image reconstructions and MIPs were
obtained to evaluate the vascular anatomy.
CONTRAST:  75mL OMNIPAQUE IOHEXOL 350 MG/ML SOLN IV

[Series 7: pe thins · axial · 0.61mm/px · z∈[-226,-17]mm · 18 of 332 slices shown]
[im 17/332  lung]
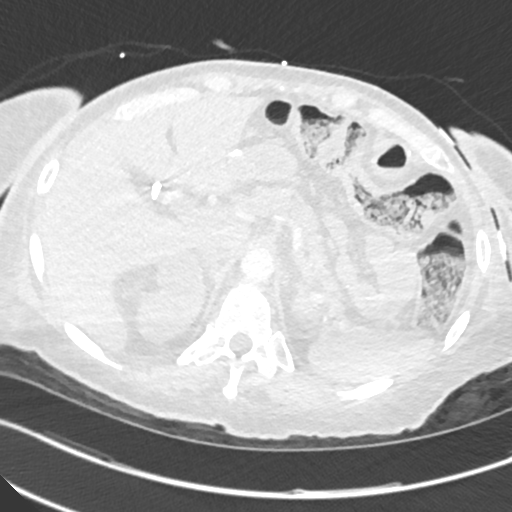
[im 34/332  mediastinal]
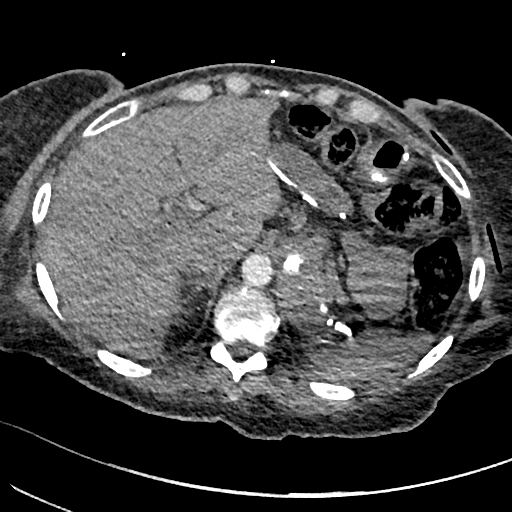
[im 50/332  lung]
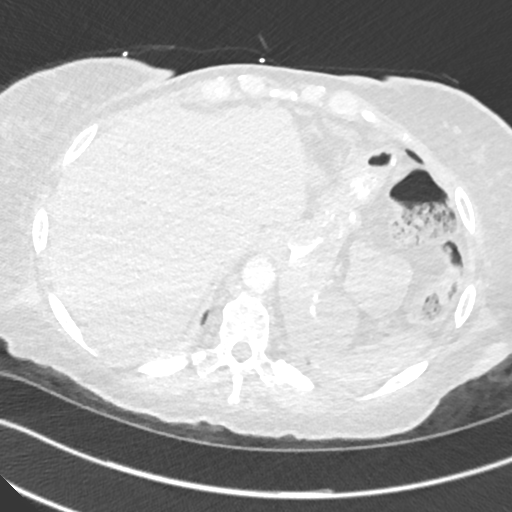
[im 67/332  mediastinal]
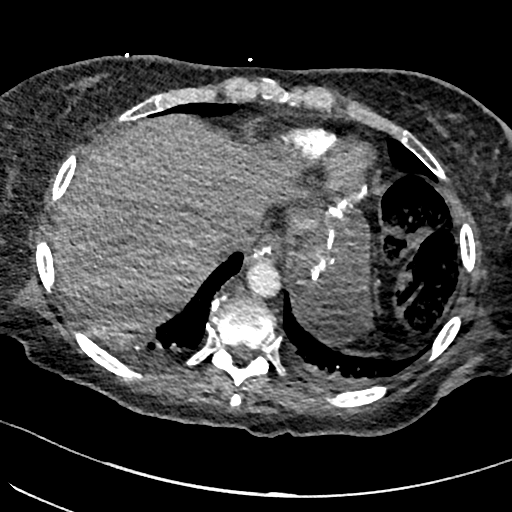
[im 83/332  lung]
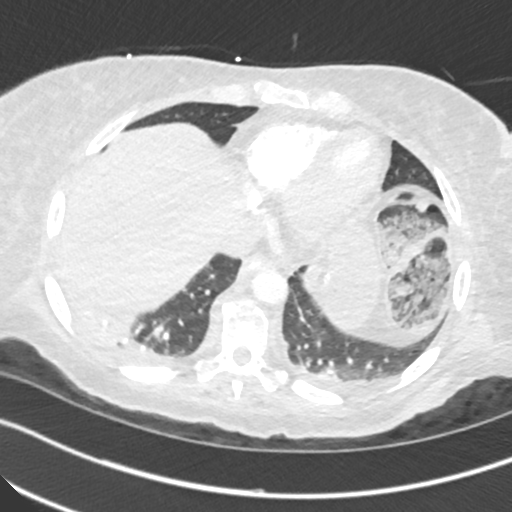
[im 100/332  mediastinal]
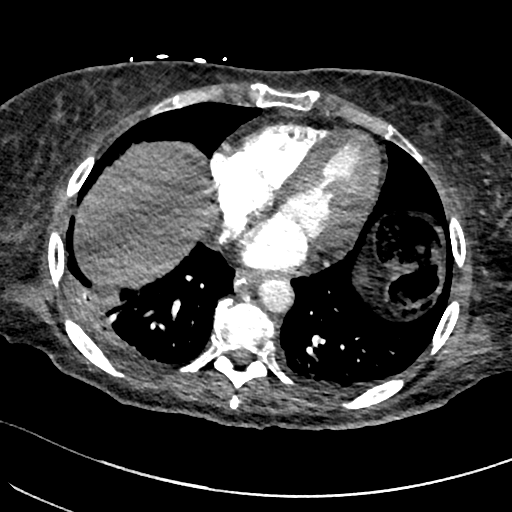
[im 116/332  lung]
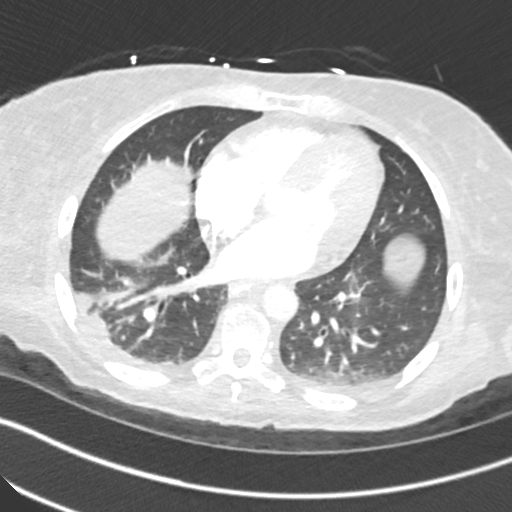
[im 133/332  mediastinal]
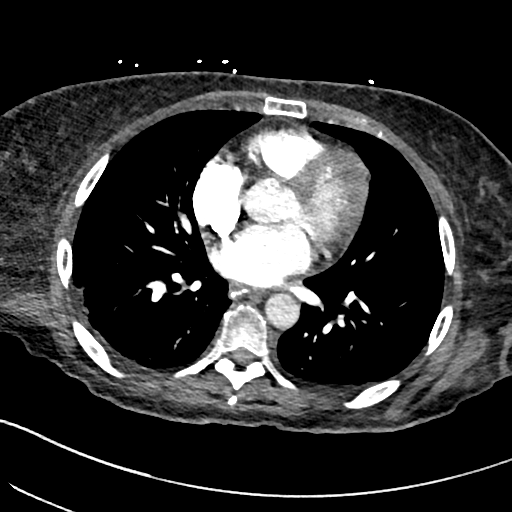
[im 149/332  lung]
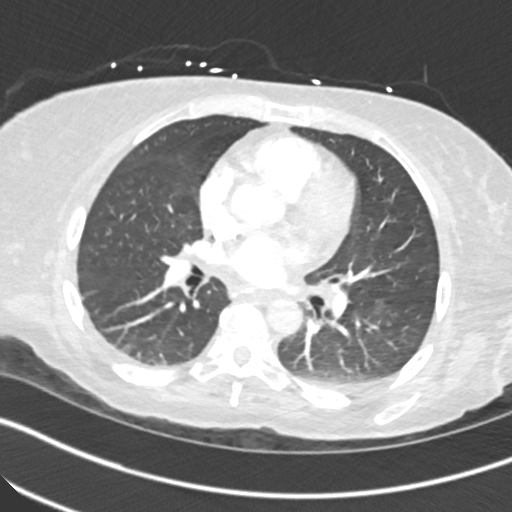
[im 183/332  mediastinal]
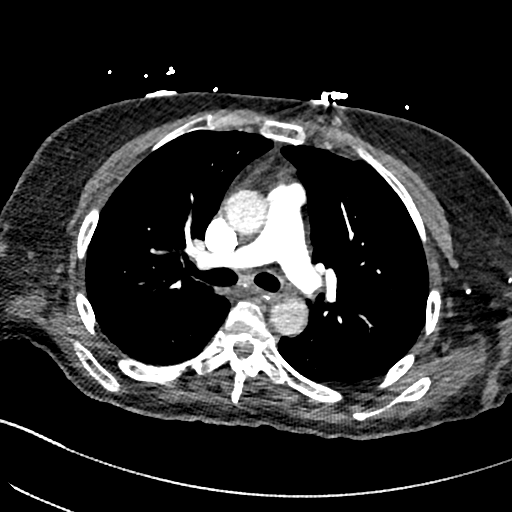
[im 199/332  lung]
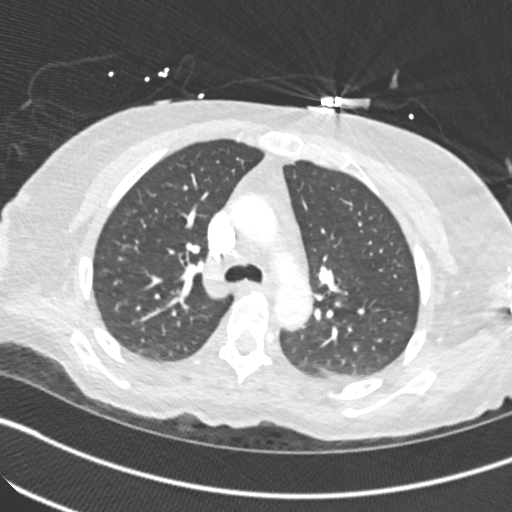
[im 216/332  mediastinal]
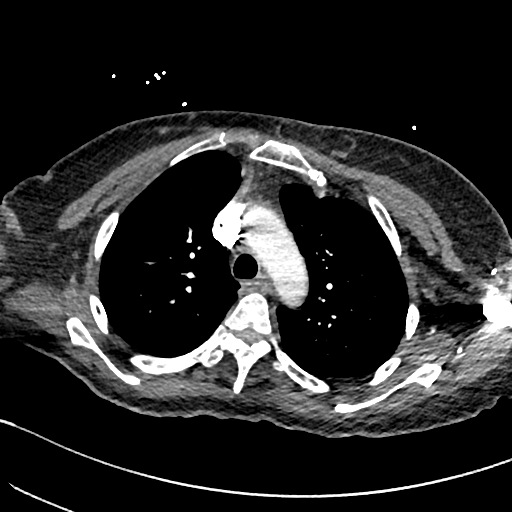
[im 232/332  lung]
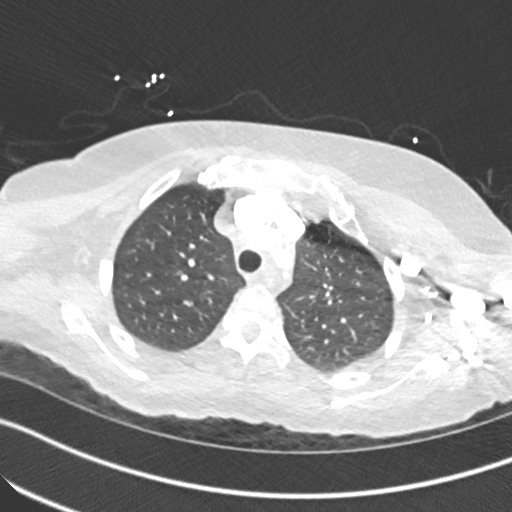
[im 249/332  mediastinal]
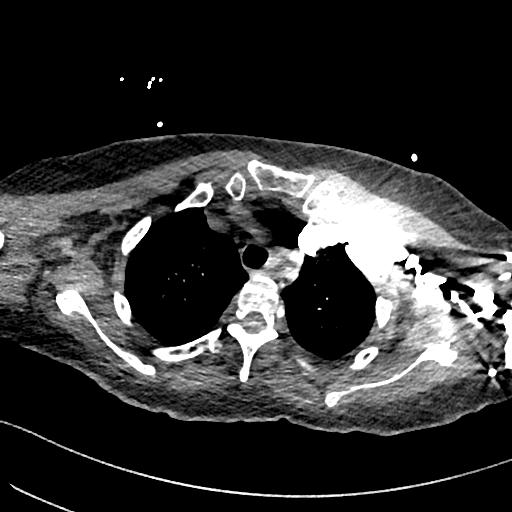
[im 265/332  lung]
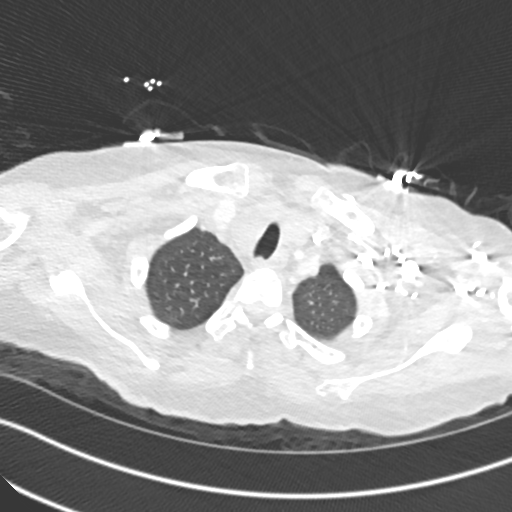
[im 282/332  mediastinal]
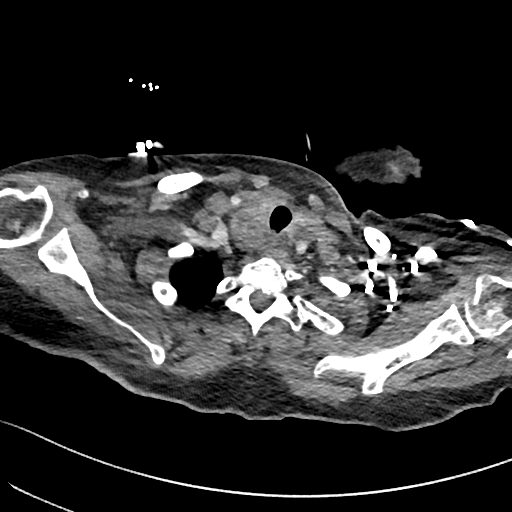
[im 298/332  lung]
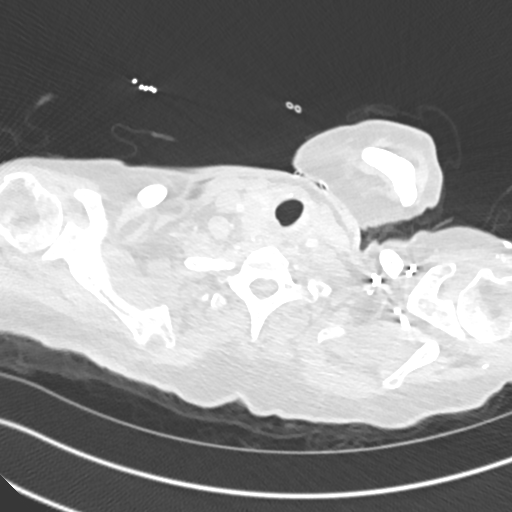
[im 315/332  mediastinal]
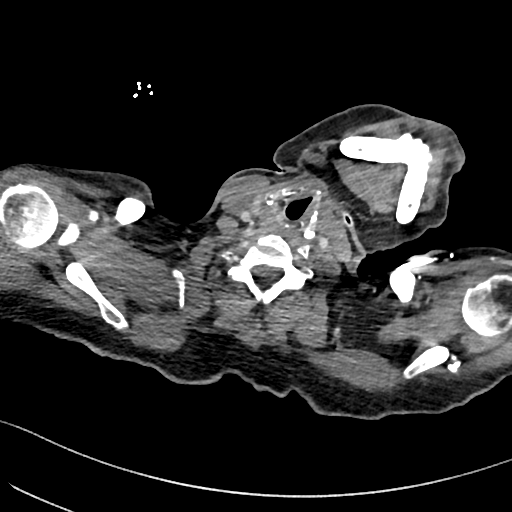

[Series 9: pe 2mm cor · coronal · 0.47mm/px · 1 of 111 slices shown]
[im 56/111  mediastinal]
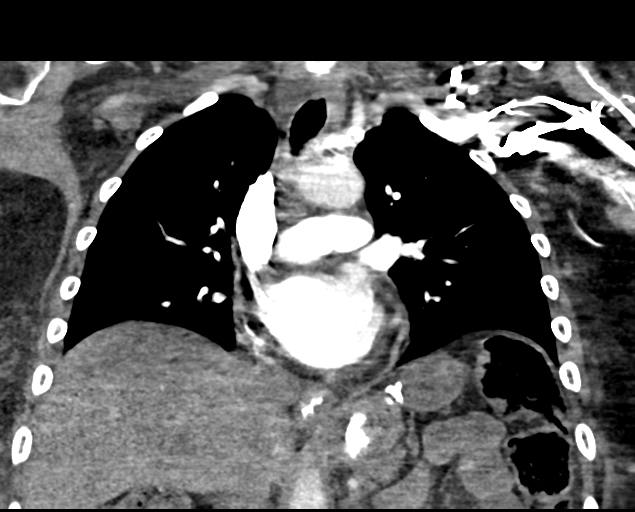

[19 of 36 positions shown; findings below may reference images not displayed]

FINDINGS: Cardiovascular: Atherosclerotic calcification aorta, minimally in
coronary arteries. Heart upper normal size. No pericardial effusion.
Pulmonary arteries adequately opacified and patent. No evidence of
pulmonary embolism.

Mediastinum/Nodes: Small amount of high density medication within
esophagus into stomach. Asymmetric prominence of the RIGHT thyroid
lobe versus LEFT with a stable linear calcification. No discrete
mass. No thoracic adenopathy.

Lungs/Pleura: Dependent atelectasis in BILATERAL lower lobes. Tiny
BILATERAL pleural effusions. Remaining lungs clear. No infiltrate or
pneumothorax.

Upper Abdomen: Gallbladder surgically absent. Prior gastric bypass
surgery. Cyst at upper pole LEFT kidney unchanged.

Musculoskeletal: Unremarkable

Review of the MIP images confirms the above findings.
IMPRESSION: No evidence of pulmonary embolism.

Tiny BILATERAL pleural effusions with dependent atelectasis in
BILATERAL lower lobes.

Post cholecystectomy and gastric bypass surgery per

Aortic Atherosclerosis ([id number]).

## 2021-07-30 NOTE — Progress Notes (Signed)
Date and time results received: 06-Aug-2021   Test: Lactic Acid Critical Value:  > 9  Name of Provider Notified: Dr. Leonides Cave  Orders Received? Or Actions Taken?:  No new Orders given

## 2021-07-30 NOTE — Progress Notes (Signed)
RT in room to get ABG, patient blood pressure low, resp shallow. Rapid called

## 2021-07-30 NOTE — Code Documentation (Signed)
  Patient Name: NAIDA ESCALANTE   MRN: [id number]   Date of Birth/ Sex: [birth year] , female      Admission Date: 07/27/2021  Attending Provider: Lanier Clam, MD  Primary Diagnosis: COVID-19 virus infection    Indication: Pt was in her usual state of health until this PM, when she was noted to be PEA arrest. Code blue was subsequently called. At the time of arrival on scene, ACLS protocol was underway.   Technical Description:  - CPR performance duration:  6 minutes minute  - Was defibrillation or cardioversion used? No   - Was external pacer placed? No  - Was patient intubated pre/post CPR? Yes   Medications Administered: Y = Yes; Blank = No Amiodarone    Atropine    Calcium    Epinephrine  y  Lidocaine    Magnesium    Norepinephrine    Phenylephrine    Sodium bicarbonate    Vasopressin     Post CPR evaluation:  - Final Status - Was patient successfully resuscitated ? Yes - What is current rhythm? Sinus brady - What is current hemodynamic status? Hypotensive on 3 pressers   Miscellaneous Information:  - Labs sent, including: Cbc, cmp, lytes,   - Primary team notified?  Yes  - Family Notified? Yes  - Additional notes/ transfer status: In ICU     Marianna Payment, MD  Jul 29, 2021, 3:45 PM

## 2021-07-30 NOTE — Progress Notes (Signed)
Confirmed with family that patients daughter Carter Kitten, patients daughter has purse and personal  belongings. Anselm Pancoast, RN witness.

## 2021-07-30 NOTE — Progress Notes (Signed)
Date and time results received: 2021-07-19 (use smartphrase ".now" to insert current time)  Test:  Hemoglobin Critical Value: 2.1  Name of Provider Notified: Dr. Marianna Payment  Orders Received? Or Actions Taken?:  No new orders given. Care is not being escalated per husband.

## 2021-07-30 NOTE — Significant Event (Signed)
Critical hemoglobin result 2.1.  No signs of bleeding GI tract via NG tube or via rectum.  Stools appear watery, brown.  Possible hemorrhage intrathoracic after repeated chest compressions.  Possible spontaneous bleed elsewhere.  Unfortunately, she is too unstable to transport for any intervention.  Additionally, she appears to have neurologic devastation from prolonged hypotension, hypoxemia.  Continue current plan of care, do not escalate care, withdrawal or pursue comfort care once family arrives.  No role for transfusions or other interventions at this time given futility.

## 2021-07-30 NOTE — Progress Notes (Signed)
   28-Jul-2021 1510  Clinical Encounter Type  Visited With Patient not available  Visit Type Code  Referral From Nurse  Consult/Referral To Chaplain   Chaplain responded to code blue. There was no support person present at this time. Chaplain was present when Dr. Silas Flood spoke with the patient's son on the phone. Chaplain provided staff check with attending RN. Chaplain remains available for follow-up spiritual/emotional support as needed. This note was prepared by Jeanine Luz, M.Div..  For questions please contact by phone [phone].

## 2021-07-30 NOTE — Progress Notes (Signed)
Patient very anxious and unsettled, MD notified and on the floor to examine patient. New orders given and carried out.  Josian Lanese, Tivis Ringer, RN

## 2021-07-30 NOTE — Progress Notes (Signed)
Notified Dr. Marianna Payment about unable to obtain pulse ox. Pt. Temp was 93 rectally and warming blanket was placed. MD aware and no orders were placed.

## 2021-07-30 NOTE — Progress Notes (Signed)
ANTICOAGULATION CONSULT NOTE - Initial Consult  Pharmacy Consult for Lovenox Indication: DVT recent (04/15/2021)  Allergies  Allergen Reactions   Shrimp [Shellfish Allergy] Anaphylaxis   Naproxen Other (See Comments)    Makes stomach cramp and burn badly   Shellfish-Derived Products     Other reaction(s): GI Upset (intolerance)    Patient Measurements: Height: 5\' 2"  (157.5 cm) Weight: 62.2 kg (137 lb 2 oz) IBW/kg (Calculated) : 50.1   Vital Signs: Temp: 98.7 F (37.1 C) (10/19 0807) Temp Source: Axillary (10/19 0807) BP: 105/83 (10/19 0807) Pulse Rate: 98 (10/19 0807)  Labs: Recent Labs    07/15/21 0042 07/16/21 0133 August 14, 2021 0457  HGB 10.2* 9.0*  --   HCT 30.1* 26.8*  --   PLT 233 173  --   CREATININE 1.05* 1.19* 1.52*    Estimated Creatinine Clearance: 33.7 mL/min (A) (by C-G formula based on SCr of 1.52 mg/dL (H)).   Medical History: Past Medical History:  Diagnosis Date   Arthritis    Calculus of gallbladder without mention of cholecystitis or obstruction    Dizziness    Fibromyalgia    GERD (gastroesophageal reflux disease)    Goiter    Hypercholesteremia    Hypertension    Lower back pain    Migraine    Nontoxic uninodular goiter    sees dr vollmer at [facility]   Obesity    PONV (postoperative nausea and vomiting)    Sleep apnea    STOPBANG=5   Stroke ([place]) 2000    Medications:  Medications Prior to Admission  Medication Sig Dispense Refill Last Dose   acetaminophen (TYLENOL) 500 MG tablet Take 500-1,000 mg by mouth every 6 (six) hours as needed for moderate pain or headache.   Past Month   albuterol (VENTOLIN HFA) 108 (90 Base) MCG/ACT inhaler Inhale 1-2 puffs into the lungs every 6 (six) hours as needed for wheezing or shortness of breath.   Past Month   aspirin EC 81 MG tablet Take 81 mg by mouth daily.   07/28/2021   D3-50 1.25 MG (50000 UT) capsule Take 50,000 Units by mouth every Monday, Wednesday, and Friday.   07/15/2021    diclofenac Sodium (VOLTAREN) 1 % GEL Apply 4 g topically 2 (two) times daily as needed (back pain).   Past Month   DULoxetine (CYMBALTA) 60 MG capsule Take 60 mg by mouth daily.   07/11/2021   escitalopram (LEXAPRO) 10 MG tablet Take 10 mg by mouth at bedtime.   07/11/2021   ferrous sulfate 325 (65 FE) MG EC tablet Take 325 mg by mouth daily.   07/07/2021   fluticasone (FLONASE) 50 MCG/ACT nasal spray Place 1 spray into both nostrils daily as needed for allergies.   0 Past Month   folic acid (FOLVITE) 1 MG tablet Take 1 mg by mouth daily.  11 07/10/2021   HYDROcodone-acetaminophen (NORCO) 10-325 MG tablet Take 1 tablet by mouth 2 (two) times daily as needed for moderate pain or severe pain.   Past Week   loratadine (CLARITIN) 10 MG tablet Take 10 mg by mouth daily.    07/09/2021   losartan (COZAAR) 50 MG tablet Take 50 mg by mouth daily.   06/29/2021   Magnesium Oxide 400 MG CAPS Take 1 capsule (400 mg total) by mouth daily. 15 capsule 0 07/21/2021   metoprolol succinate (TOPROL-XL) 100 MG 24 hr tablet Take 100 mg by mouth at bedtime.    07/11/2021   mirtazapine (REMERON) 7.5 MG tablet Take 7.5  mg by mouth at bedtime.   07/11/2021   morphine (MS CONTIN) 15 MG 12 hr tablet Take 15 mg by mouth in the morning and at bedtime.   07/01/2021   ondansetron (ZOFRAN-ODT) 4 MG disintegrating tablet Take 4 mg by mouth every 8 (eight) hours as needed for nausea/vomiting.   Past Month   pantoprazole (PROTONIX) 40 MG tablet Take 1 tablet (40 mg total) by mouth 2 (two) times daily. 60 tablet 0 07/05/2021   polyethylene glycol (MIRALAX / GLYCOLAX) 17 g packet Take 17 g by mouth daily as needed for mild constipation. 14 each 0 unk   spironolactone (ALDACTONE) 25 MG tablet Take 1 tablet (25 mg total) by mouth daily. 30 tablet 0 07/22/2021   APIXABAN (ELIQUIS) VTE STARTER PACK (10MG  AND 5MG ) Take as directed on package: start with two-5mg  tablets twice daily for 7 days. On day 8, switch to one-5mg  tablet twice daily.  (Patient not taking: No sig reported) 1 each 0 Completed Course   clindamycin (CLEOCIN) 300 MG capsule Take 1 capsule (300 mg total) by mouth 4 (four) times daily. X 7 days (Patient not taking: No sig reported) 28 capsule 0 Completed Course   erythromycin ophthalmic ointment Place a 1/2 inch ribbon of ointment into the lower eyelid four times a day for 7 days (Patient not taking: No sig reported) 3.5 g 0 Completed Course   furosemide (LASIX) 40 MG tablet Take 40 mg by mouth See admin instructions. Qd x 5 days (Patient not taking: Reported on 07/13/2021)   Completed Course   isosorbide mononitrate (IMDUR) 30 MG 24 hr tablet Take 1 tablet (30 mg total) by mouth daily. (Patient not taking: No sig reported) 30 tablet 0 Not Taking   KLOR-CON M20 20 MEQ tablet Take 20 mEq by mouth See admin instructions. Qd x 10 days (Patient not taking: Reported on 07/13/2021)   Completed Course   pregabalin (LYRICA) 75 MG capsule Take 1 capsule (75 mg total) by mouth 3 (three) times daily. (Patient not taking: No sig reported) 90 capsule 0 Not Taking    Assessment: 60 y.o female on apixaban for h/o recent PE diagnosed 04/15/21. She presented to hospital on 10/14 with worsening leg edema over past several months.  Incidental finding in ED COVID-19 positive.  10/18 noted BLE edema and RLE pain>  BLE venous duplex performed 10/18 whiche was negative.   On 10/19 Pharmacy consulted to switch from apixaban to SQ Lovenox for VTE.  H/o recent PE.   10/15 CT head: No evidence of acute intracranial abnormality 10/16: CT head: normal  Goal of Therapy:  Monitor platelets by anticoagulation protocol: Yes   Plan:  Stop apixaban , switch to Lovenox 60 mg sq q12h start now. Monitor for bleeding,CBC,  renal function change F/u for restart of apixaban    Thank you for allowing pharmacy to be part of this patients care team. Nicole Cella, [place] Pharmacist [phone] 19-Jul-2021,8:15 AM Please check AMION for all [place] phone numbers After 10:00 PM, call [place]

## 2021-07-30 NOTE — Progress Notes (Signed)
Brief Nutrition Note  Discussed pt with RN and during ICU rounds. Cortrak placed today when pt on the floor. Shortly after, pt with Code Blue and respiratory arrest. Pt transferred to the ICU and intubated.  Discussed pt with CCM MD who would like to start trickle tube feeds today via Cortrak tube with no advancement. Spoke with radiologist who believes Cortrak tube tip extends past gastric pouch into small bowel based on available x-ray images and comparison to past CT scans. Discussed with CCM who agrees with starting tube feeds at trickle rate today via Cortrak. Will monitor for ability to advance tube feeds to goal. Will follow up with pt tomorrow.  RD to order: - Vital 1.5 @ 20 ml/hr (480 ml/day)  Tube feeding regimen provides 720 kcal, 32 grams of protein, and 367 ml of H2O.    Gustavus Bryant, MS, RD, LDN Inpatient Clinical Dietitian Please see AMiON for contact information.

## 2021-07-30 NOTE — Progress Notes (Signed)
Patient was transported to 53M03 without any complications. Report was given to 53M RT.

## 2021-07-30 NOTE — Procedures (Signed)
Cortrak  Person Inserting Tube:  Hewitt Garner, Creola Corn, RD Tube Type:  Cortrak - 43 inches Tube Size:  10 Tube Location:  Left nare Initial Placement:  Stomach Secured by: Bridle Technique Used to Measure Tube Placement:  Marking at nare/corner of mouth Cortrak Secured At:  66 cm  Cortrak Tube Team Note:  Consult received to place a Cortrak feeding tube.   X-ray is required, abdominal x-ray has been ordered by the Cortrak team. Please confirm tube placement before using the Cortrak tube.   If the tube becomes dislodged please keep the tube and contact the Cortrak team at [email or web address] ([email or web address]) for replacement.  If after hours and replacement cannot be delayed, place a NG tube and confirm placement with an abdominal x-ray.    Larkin Ina, MS, RD, LDN (she/her/hers) RD pager number and weekend/on-call pager number located in [place].

## 2021-07-30 NOTE — Code Documentation (Signed)
  Patient Name: Jodi Bowman   MRN: [id number]   Date of Birth/ Sex: Mar 21, 1961 , female      Admission Date: 06/30/2021  Attending Provider: Lanier Clam, MD  Primary Diagnosis: COVID-19 virus infection    Indication: PEA arrest with hypotension in ICU   Technical Description:  - CPR performance duration:  12 minute  - Was defibrillation or cardioversion used? No   - Was external pacer placed? Yes  - Was patient intubated pre/post CPR? No already intubated   Medications Administered: Y = Yes; Blank = No Amiodarone    Atropine    Calcium    Epinephrine  Y x 3  Lidocaine    Magnesium    Norepinephrine    Phenylephrine    Sodium bicarbonate  Y   Vasopressin     Post CPR evaluation:  - Final Status - Was patient successfully resuscitated ? Yes - What is current rhythm? Sinus - What is current hemodynamic status? tenuous  Miscellaneous Information:  - Labs sent, including: CBC, CMP, ABG, troponin  - Primary team notified?  I am primary  - Family Notified? Yes  - Additional notes/ transfer status: N/a     Lanier Clam, MD  August 10, 2021, 3:30 PM

## 2021-07-30 NOTE — Code Documentation (Signed)
Responded to CODE BLUE on floor.  Respiratory arrest.  Pulses never lost.  Encephalopathic, extremely hypotensive.  Norepinephrine started.  The decision was made to place an airway given encephalopathy and instability.  Appreciate anesthesia assistance with providing glide scope.  Airway was placed.  Shortly thereafter she became more hypotensive after obtaining normotensive blood pressure on 40 of norepinephrine.  It was determined that the I/O line placed earlier was malfunctioning.  Pressors were transitioned to peripheral IV.  100 mcg of epinephrine was pushed.  Blood pressures improved, peripheral pulses improved.  Chest x-ray confirmed ET tube placement.  The, instructed RT to retract by 3 centimeters.  She was transported to the ICU on the ventilator in critical and unstable condition.

## 2021-07-30 NOTE — Consult Note (Addendum)
Hospital Consult    Reason for Consult:  loss of doppler signals after code blue Requesting Physician:  Elgergawy  MRN #:  [id number]  History obtained from chart as pt is intubated.    History of Present Illness: This is a 60 y.o. female who presented to the hospital a few days ago with dizziness, weakness and syncope as well as sob.  TEE in June 2022 revealed EF of 60-65%.  Her covid test this admission is positive.    She has had LLE pain and RLE swelling.  She had a duplex and this was negative for DVT / SVT.  She is on apixaban as she was diagnosed with PE in July.   Code blue was called this morning for respiratory arrest.  Pulses were not lost.  She was hypotensive.  She was intubated.  She required Levophed and epi.  Doppler signals were not able to be obtained and therefore Vascular surgery was consulted.     She has hx of gastric bypass surgery, OSA, HTN, Stroke.  She had a TEE earlier this year that was negative for cardiac dysfunction.      Past Medical History:  Diagnosis Date   Arthritis    Calculus of gallbladder without mention of cholecystitis or obstruction    Dizziness    Fibromyalgia    GERD (gastroesophageal reflux disease)    Goiter    Hypercholesteremia    Hypertension    Lower back pain    Migraine    Nontoxic uninodular goiter    sees dr vollmer at [facility]   Obesity    PONV (postoperative nausea and vomiting)    Sleep apnea    STOPBANG=5   Stroke ([place]) 2000    Past Surgical History:  Procedure Laterality Date   ABDOMINAL HYSTERECTOMY     BOWEL RESECTION N/A 03/06/2018   Procedure: SMALL BOWEL ANASTAMOSIS;  Surgeon: Clovis Riley, MD;  Location: [place];  Service: General;  Laterality: N/A;   CATARACT EXTRACTION W/ INTRAOCULAR LENS IMPLANT Bilateral    CESAREAN SECTION  yrs ago   done x 2   CHOLECYSTECTOMY  01/05/2012   Procedure: LAPAROSCOPIC CHOLECYSTECTOMY WITH INTRAOPERATIVE CHOLANGIOGRAM;  Surgeon: Pedro Earls, MD;  Location: [facility];  Service: General;  Laterality: N/A;   COLONOSCOPY  10/08/2012   Procedure: COLONOSCOPY;  Surgeon: Beryle Beams, MD;  Location: [facility];  Service: Endoscopy;  Laterality: N/A;   KNEE ARTHROSCOPY  one 1995 and 1 in 1997   both knees done   LAPAROSCOPY N/A 03/06/2018   Procedure: LAPAROSCOPY DIAGNOSTIC WITH LYSIS OF ADHESIONS;  Surgeon: Clovis Riley, MD;  Location: [facility];  Service: General;  Laterality: N/A;   LAPAROTOMY N/A 03/06/2018   Procedure: EXPLORATORY LAPAROTOMY, RESECTION OF DISTAL ROUX, CLOSURE OF INTERNAL HERNIA;  Surgeon: Clovis Riley, MD;  Location: [facility];  Service: General;  Laterality: N/A;   LEFT HEART CATH AND CORONARY ANGIOGRAPHY N/A 08/29/2019   Procedure: LEFT HEART CATH AND CORONARY ANGIOGRAPHY;  Surgeon: Charolette Forward, MD;  Location: [facility];  Service: Cardiovascular;  Laterality: N/A;   LUMBAR WOUND DEBRIDEMENT N/A 04/03/2019   Procedure: LUMBAR WOUND WASHOUT;  Surgeon: Judith Part, MD;  Location: [facility];  Service: Neurosurgery;  Laterality: N/A;   POSTERIOR LUMBAR FUSION  03/04/2019   surgery for endometriosis  yrs ago   TEE WITHOUT CARDIOVERSION N/A 03/22/2021   Procedure: TRANSESOPHAGEAL ECHOCARDIOGRAM (TEE);  Surgeon: Skeet Latch, MD;  Location: [facility];  Service: Cardiovascular;  Laterality: N/A;   thryoid biopsy  December 01, 2011    at [facility]    Allergies  Allergen Reactions   Shrimp [Shellfish Allergy] Anaphylaxis   Naproxen Other (See Comments)    Makes stomach cramp and burn badly   Shellfish-Derived Products     Other reaction(s): GI Upset (intolerance)    Prior to Admission medications   Medication Sig Start Date End Date Taking? Authorizing Provider  acetaminophen (TYLENOL) 500 MG tablet Take 500-1,000 mg by mouth every 6 (six) hours as needed for moderate pain or headache.   Yes [provider]  albuterol (VENTOLIN HFA) 108 (90 Base) MCG/ACT inhaler Inhale 1-2 puffs into the lungs every 6 (six) hours as  needed for wheezing or shortness of breath.   Yes [provider]  aspirin EC 81 MG tablet Take 81 mg by mouth daily.   Yes [provider]  D3-50 1.25 MG (50000 UT) capsule Take 50,000 Units by mouth every Monday, Wednesday, and Friday. 05/06/20  Yes [provider]  diclofenac Sodium (VOLTAREN) 1 % GEL Apply 4 g topically 2 (two) times daily as needed (back pain).   Yes [provider]  DULoxetine (CYMBALTA) 60 MG capsule Take 60 mg by mouth daily. 08/11/19  Yes [provider]  escitalopram (LEXAPRO) 10 MG tablet Take 10 mg by mouth at bedtime. 05/04/20  Yes [provider]  ferrous sulfate 325 (65 FE) MG EC tablet Take 325 mg by mouth daily. 07/26/19  Yes [provider]  fluticasone (FLONASE) 50 MCG/ACT nasal spray Place 1 spray into both nostrils daily as needed for allergies.  11/08/17  Yes [provider]  folic acid (FOLVITE) 1 MG tablet Take 1 mg by mouth daily. 10/14/17  Yes [provider]  HYDROcodone-acetaminophen (NORCO) 10-325 MG tablet Take 1 tablet by mouth 2 (two) times daily as needed for moderate pain or severe pain. 02/16/20  Yes [provider]  loratadine (CLARITIN) 10 MG tablet Take 10 mg by mouth daily.    Yes [provider]  losartan (COZAAR) 50 MG tablet Take 50 mg by mouth daily. 05/17/21  Yes [provider]  Magnesium Oxide 400 MG CAPS Take 1 capsule (400 mg total) by mouth daily. 09/28/19  Yes Carlisle Cater, PA-C  metoprolol succinate (TOPROL-XL) 100 MG 24 hr tablet Take 100 mg by mouth at bedtime.  07/10/19  Yes [provider]  mirtazapine (REMERON) 7.5 MG tablet Take 7.5 mg by mouth at bedtime. 07/30/20  Yes [provider]  morphine (MS CONTIN) 15 MG 12 hr tablet Take 15 mg by mouth in the morning and at bedtime. 06/12/21  Yes [provider]  ondansetron (ZOFRAN-ODT) 4 MG disintegrating tablet Take 4 mg by mouth every 8 (eight) hours as  needed for nausea/vomiting. 06/07/20  Yes [provider]  pantoprazole (PROTONIX) 40 MG tablet Take 1 tablet (40 mg total) by mouth 2 (two) times daily. 03/15/19  Yes Angiulli, Lavon Paganini, PA-C  polyethylene glycol (MIRALAX / GLYCOLAX) 17 g packet Take 17 g by mouth daily as needed for mild constipation. 03/08/19  Yes Viona Gilmore D, NP  spironolactone (ALDACTONE) 25 MG tablet Take 1 tablet (25 mg total) by mouth daily. 04/29/21 07/13/21 Yes Deno Etienne, DO  APIXABAN [place]ne [place]) VTE STARTER PACK (10MG  AND 5MG ) Take as directed on package: start with two-5mg  tablets twice daily for 7 days. On day 8, switch to one-5mg  tablet twice daily. Patient not taking: No sig reported 04/15/21  Pattricia Boss, MD  clindamycin (CLEOCIN) 300 MG capsule Take 1 capsule (300 mg total) by mouth 4 (four) times daily. X 7 days Patient not taking: No sig reported 04/29/21   Deno Etienne, DO  erythromycin ophthalmic ointment Place a 1/2 inch ribbon of ointment into the lower eyelid four times a day for 7 days Patient not taking: No sig reported 04/29/21   Deno Etienne, DO  furosemide (LASIX) 40 MG tablet Take 40 mg by mouth See admin instructions. Qd x 5 days Patient not taking: Reported on 07/13/2021 06/12/21   [provider]  isosorbide mononitrate (IMDUR) 30 MG 24 hr tablet Take 1 tablet (30 mg total) by mouth daily. Patient not taking: No sig reported 11/18/19   Krista Blue L, PA-C  KLOR-CON M20 20 MEQ tablet Take 20 mEq by mouth See admin instructions. Qd x 10 days Patient not taking: Reported on 07/13/2021 06/12/21   [provider]  pregabalin (LYRICA) 75 MG capsule Take 1 capsule (75 mg total) by mouth 3 (three) times daily. Patient not taking: No sig reported 03/15/19   Angiulli, Lavon Paganini, PA-C  metoprolol (LOPRESSOR) 50 MG tablet Take 50 mg by mouth 2 (two) times daily.    12/04/11  [provider]  simvastatin (ZOCOR) 20 MG tablet Take 20 mg by mouth at bedtime.    12/04/11  [provider]    Social History   Socioeconomic History   Marital status: Married    Spouse name: Not on file   Number of children: 2   Years of education: 14   Highest education level: Not on file  Occupational History   Occupation: Unemployed  Tobacco Use   Smoking status: Never   Smokeless tobacco: Never  Vaping Use   Vaping Use: Never used  Substance and Sexual Activity   Alcohol use: Not Currently    Alcohol/week: 0.0 standard drinks   Drug use: No   Sexual activity: Not Currently  Other Topics Concern   Not on file  Social History Narrative   Lives at home with family.   Right-handed.   No caffeine use.   Social Determinants of Health   Financial Resource Strain: Not on file  Food Insecurity: Not on file  Transportation Needs: Not on file  Physical Activity: Not on file  Stress: Not on file  Social Connections: Not on file  Intimate Partner Violence: Not on file    Family History  Problem Relation Age of Onset   Neuropathy Mother    Diabetes Mother    Hypertension Mother    Transient ischemic attack Mother    Seizures Mother    Dementia Mother    Other Father        MVA   Neuropathy Sister    Neuropathy Brother    Cancer Brother        colon and lung   Cancer Maternal Grandmother        colon    ROS: [x]  Positive   [ ]  Negative   [ ]  All sytems reviewed and are negative Unable to obtain due to intubation.  Physical Examination  Vitals:   2021/08/04 0400 2021-08-04 0807  BP: (!) 118/100 105/83  Pulse:  98  Resp: 19 20  Temp:  98.7 F (37.1 C)  SpO2:     Body mass index is 25.08 kg/m.  General:  WDWN in NAD Gait: Not observed HENT: WNL, normocephalic Pulmonary: normal non-labored breathing Cardiac: regular Abdomen:  soft Skin: without  rashes; + large bullae posterior RLE  Vascular Exam/Pulses: Faint right AT doppler signal; unable to palpate femoral pulses but doppler signals present bilaterally Extremities: large superficial wound  calf on the RLE Musculoskeletal: no muscle wasting or atrophy  Neurologic: A&O X 3; speech is fluent/normal Psychiatric:  The pt is intubated and unable to assess   CBC    Component Value Date/Time   WBC 3.1 (L) 09-Aug-2021 0457   RBC 2.87 (L) 08/09/2021 0457   HGB 9.3 (L) 08/09/21 0457   HCT 28.4 (L) 2021-08-09 0457   HCT 25.6 (L) 08/26/2019 0406   PLT 45 (L) 09-Aug-2021 0457   MCV 99.0 09-Aug-2021 0457   MCH 32.4 Aug 09, 2021 0457   MCHC 32.7 2021/08/09 0457   RDW 21.0 (H) 08-09-21 0457   LYMPHSABS 0.2 (L) 2021-08-09 0457   MONOABS 0.2 08-09-2021 0457   EOSABS 0.0 2021-08-09 0457   BASOSABS 0.0 08/09/21 0457    BMET    Component Value Date/Time   NA 129 (L) 09-Aug-2021 0457   K 3.8 2021/08/09 0457   CL 100 August 09, 2021 0457   CO2 11 (L) 2021/08/09 0457   GLUCOSE 89 08-09-2021 0457   BUN 23 08/09/2021 0457   CREATININE 1.52 (H) August 09, 2021 0457   CALCIUM 6.8 (L) 08/09/21 0457   GFRNONAA 39 (L) Aug 09, 2021 0457   GFRAA >60 06/30/2020 0144    COAGS: Lab Results  Component Value Date   INR 1.1 07/13/2021   INR 1.6 (H) 04/29/2021   INR 1.1 03/27/2021     Non-Invasive Vascular Imaging:   DVT duplex negative BLE on 08/09/2021   ASSESSMENT/PLAN: This is a 60 y.o. female who is covid + admitted to hospital with worsening leg edema who required emergent intubation with Code blue this am.  -there is a faint right AT doppler signal.  I am unable to palpate femoral pulses but there is doppler signals present bilateral femoral signals.  Creatinine elevated today at 1.5.  may need CTA aortobifem with runoff.  Pt did receive norepi and epi earlier but does appear to be on pressors currently.   -Dr. Virl Cagey to see pt and determine plan.  Leontine Locket, PA-C [facility] [phone]  VASCULAR STAFF ADDENDUM: I have independently interviewed and examined the patient. I agree with the above.   In short, patient seen and examined this afternoon.  This  note is a late entry, but all information was discussed with the covering provider as well as nursing. Patient COVID-positive with recent cardiac arrest. When seen, levo was being weaned from 40-40mcg. Patient was intubated and sedated Bear hugger on the legs 2+ palpable femoral arteries bilaterally, multiphasic posterior tibial signal in the right foot, multiphasic anterior tibial signal in the left foot Initial poor signals likely due to shock and pressor requirement. No concern for acute limb ischemia in the lower extremities at this time Recommend formal ABIs versus bilateral lower extremity duplex ultrasounds for objective measurements.  Please call if questions or concerns arise,   Cassandria Santee, MD [facility] Phone Number: [phone] 09-Aug-2021 7:54 PM

## 2021-07-30 NOTE — Progress Notes (Signed)
Code blue callled

## 2021-07-30 NOTE — Progress Notes (Addendum)
Patient was respiratory arrest from 20 W and needed to be intubated on the floor. When patient arrived to ICU she was very unstable and a central line was placed so the patient could stabilize.   AT 1342 patient CODE BLUE. See code documentation for further information. ROSC achieved at 1351.   Patient continued to decompensate. MDs were notified, orders were given, and family was notified.   AT 1510 patient CODE Blue. See code documentation for further information. ROSC achieved at 1515.   During ICU stay husband was admitted to ED. AT 20 Husband was brought up to the unit by Dannielle Karvonen to see patient and get updated by MDs. At this time husband made the patient a DNR and decided not to escalate care.

## 2021-07-30 NOTE — Procedures (Signed)
Arterial Catheter Insertion Procedure Note  JAMILLE FISHER  [id number]  [birth year]  Date:07-26-2021  Time:2:35 PM    Provider Performing: Cordella Register    Procedure: Insertion of Arterial Line [phone]) without US guidance  Indication(s) Blood pressure monitoring and/or need for frequent ABGs  Consent Unable to obtain consent due to inability to find a medical decision maker for patient.  All reasonable efforts were made.  Another independent medical provider, Dr. Silas Flood , confirmed the benefits of this procedure outweigh the risks.  Anesthesia None   Time Out Verified patient identification, verified procedure, site/side was marked, verified correct patient position, special equipment/implants available, medications/allergies/relevant history reviewed, required imaging and test results available.   Sterile Technique Maximal sterile technique including full sterile barrier drape, hand hygiene, sterile gown, sterile gloves, mask, hair covering, sterile ultrasound probe cover (if used).   Procedure Description Area of catheter insertion was cleaned with chlorhexidine and draped in sterile fashion. Without real-time ultrasound guidance an arterial catheter was placed into the right radial artery.  Appropriate arterial tracings confirmed on monitor.     Complications/Tolerance None; patient tolerated the procedure well.   EBL Minimal   Specimen(s) None

## 2021-07-30 NOTE — Progress Notes (Signed)
   August 05, 2021 1343  Clinical Encounter Type  Visited With Patient not available;Family  Visit Type Code  Referral From Nurse  Consult/Referral To Chaplain   Chaplain responded to the code. There was no support person present at the time. Chaplain accompanied AC Loralee Pacas while she contacted the patient's son, [place]. Antonio arrived, and Chaplain escorted him to the unit's family meeting room. The Chaplain provided emotional grief support to the son as Dr. Catalina Antigua Hunsucker explained the patient's condition and care goals. The patient's husband is also a patient in the ED. The son left with the doctor to provide an update to his stepfather in ED24. Chaplain remains available for follow-up spiritual/emotional support as needed. This note was prepared by Jeanine Luz, M.Div..  For questions please contact by phone [phone].

## 2021-07-30 NOTE — Progress Notes (Signed)
[facility]  PROGRESS NOTE  Jodi Bowman YPP:[id number] DOB: 06/14/1961 DOA: 07/04/2021 PCP: Aletha Halim., PA-C   Brief HPI:    60 year old female with a history of OSA, hypertension, prior stroke, gastric bypass in 2017 came to hospital with worsening leg edema for the past several months.  CT abdominal scan in July was not impressive for liver abnormalities, 2D echo including TEE done earlier this year was negative for cardiac dysfunction.  She had history of gastric bypass surgery and has been having loose stools for months. Today she came to ED when she would have dizziness, generalized weakness, syncope on standing.  Also has been having shortness of breath for past 3 days. In the ED COVID-19 RT-PCR test came back positive.  10/17-patient complained of  left-sided facial weakness and left leg weakness.  Code stroke was called, she was seen by neurology.  CT head was unremarkable.  Neurology felt that this was recrudescence of her prior stroke symptoms or potentially new small vessel stroke.  MRI brain without contrast was ordered.  10/19 -Patient is significantly lethargic this morning, as well with low blood pressure, did receive some oxycodone this morning, she received Narcan with no significant change in mental status, blood pressure remains low, and she is altered, blood work significant for multiple electrolyte abnormalities which she did develop overnight including hyponatremia, hypocalcemia, hypophosphatemia, hypomagnesemia, as well her work-up Significant for borderline B12, and low copper and zinc, thiamine level still pending.  Subjective   Response was called given patient is hypotensive, altered, did require intubation, and started on pressors.   Assessment/Plan:   Acute Respiratory failure -Patient was intubated emergently by PCCM as she is unable to protect her airways.  Hypotension -Patient with severe hypotension, started on pressor supports, she is  currently on Levophed, transferred to ICU.   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  Hypoglycemia -Continue with IV fluids  Left-sided facial droop/left lower extremity weakness -CT head negative for stroke -Symptoms seem to have resolved -Follow MRI brain without contrast  Lower extremity pain/right lower extremity swelling -Patient complains of pain in the left lower extremity, venous Dopplers negative for DVT -Patient is already on apixaban -Right lower extremity with significant swelling, and significant acne thickened bullae as well, could not detect pulses with Dopplers, so vascular surgery were consulted.  Hypoglycemia -Patient having recurrent episodes of hypoglycemia -Continue D10 half-normal saline at 50 mL/h  Hypokalemia -Replete  Urinary retention -Continue with Foley catheter   Syncope -Continue telemetry monitoring -Orthostatic vital signs every 4 hours x3 ordered, currently pending  -Had TEE earlier in June 2022, which showed EF of 60-65%, no wall motion abnormalities  Recent history of pulmonary embolism -Diagnosed in July of this year -We will continue with anticoagulation with apixaban  Scheduled medications:    calcium carbonate  1 tablet Oral TID    Chlorhexidine Gluconate Cloth  6 each Topical Daily   cyanocobalamin  1,000 mcg Intramuscular  Daily   Followed by   Derrill Memo ON 07/24/2021] cyanocobalamin  1,000 mcg Intramuscular Weekly   docusate  100 mg Per Tube BID   enoxaparin (LOVENOX) injection  60 mg Subcutaneous Q12H   feeding supplement (PROSource TF)  45 mL Per Tube BID   LORazepam  1 mg Intravenous Once   methylPREDNISolone (SOLU-MEDROL) injection  60 mg Intravenous Daily   multivitamin with minerals  1 tablet Oral BID   polyethylene glycol  17 g Per Tube Daily   zinc sulfate  220 mg Oral BID     Data Reviewed:   CBG:  Recent Labs  Lab 07/16/21 2038 07/16/21 2348 07-30-2021 0104 Jul 30, 2021 0438 July 30, 2021 0806  GLUCAP 84 67* 85 91 82    SpO2: 95 % O2 Flow Rate (L/min): 2 L/min FiO2 (%): 100 %    Vitals:   07/30/21 0000 2021-07-30 0400 07/30/2021 0500 Jul 30, 2021 0807  BP: (!) 144/127 (!) 118/100  105/83  Pulse: (!) 111   98  Resp: 20 19  20   Temp: 98.4 F (36.9 C)   98.7 F (37.1 C)  TempSrc: Oral   Axillary  SpO2:      Weight:   62.2 kg   Height:         Intake/Output Summary (Last 24 hours) at 2021/07/30 1109 Last data filed at 30-Jul-2021 0530 Gross per 24 hour  Intake 1442.67 ml  Output 900 ml  Net 542.67 ml    10/17 1901 - 10/19 0700 In: 2217.4 [P.O.:100; I.V.:1917] Out: 1100 [Urine:1100]  Filed Weights   07/18/2021 2039 07/13/21 2255 2021/07/30 0500  Weight: 51.3 kg 100.1 kg 62.2 kg    Data Reviewed: Basic Metabolic Panel: Recent Labs  Lab 07/04/2021 2058 07/13/21 0120 07/07/2021 0132 07/15/21 0042 07/16/21 0133 07/16/21 0859 07/16/21 1649 07-30-2021 0457  NA 138  --  139 138 136  --   --  129*  K 2.7*  --  3.5 3.3* 4.3  --   --  3.8  CL 105  --  106 105 104  --   --  100  CO2 27  --  25 22 21*  --   --  11*  GLUCOSE 89  --  81 84 80 81  --  89  BUN 12  --  13 15 19   --   --  23  CREATININE 0.91  --  0.85 1.05* 1.19*  --   --  1.52*  CALCIUM 7.4*  --  7.5* 7.8* 7.3*  --   --  6.8*  MG  --   1.3* 1.8  --   --   --  1.6* 1.5*  PHOS  --   --   --   --   --   --  2.1* 1.7*   Liver Function Tests: Recent Labs  Lab 07/13/21 0120 07/27/2021 0132 07/15/21 0042 07/16/21 0133 07/30/2021 0457  AST 23 32 35 28 26  ALT 34 31 35 24 20  ALKPHOS 74 65 71 68 48  BILITOT 2.9* 1.5* 1.3* 0.9 0.8  PROT 4.8* 4.4* 4.9* 3.8* <3.0*  ALBUMIN 1.6* <1.5* <1.5* <1.5* <1.5*   No results for input(s): LIPASE, AMYLASE in the last 168 hours. No results for input(s): AMMONIA in the last 168 hours. CBC: Recent Labs  Lab 06/30/2021 2058 07/06/2021 0132 07/15/21 0042 07/16/21 0133 2021-07-30 0457  WBC 7.5 10.8* 11.1* 8.4 3.1*  NEUTROABS 5.3 9.8* 10.4* 7.1 1.0*  HGB 8.7* 8.7* 10.2* 9.0* 9.3*  HCT 26.3* 25.6*  30.1* 26.8* 28.4*  MCV 98.1 95.9 98.0 98.2 99.0  PLT 282 277 233 173 45*   Cardiac Enzymes: No results for input(s): CKTOTAL, CKMB, CKMBINDEX, TROPONINI in the last 168 hours. BNP (last 3 results) Recent Labs    04/29/21 1309 06/30/2021 2058  BNP 82.5 42.6    ProBNP (last 3 results) No results for input(s): PROBNP in the last 8760 hours.  CBG: Recent Labs  Lab 07/16/21 2038 07/16/21 2348 2021-08-11 0104 Aug 11, 2021 0438 08-11-21 0806  GLUCAP 84 67* 85 91 82       Radiology Reports  VAS [place] LOWER EXTREMITY VENOUS (DVT)  Result Date: Aug 11, 2021  Lower Venous DVT Study Patient Name:  ADRINNE SZE [facility]  Date of Exam:   07/16/2021 Medical Rec #: [id number]         Accession #:    [id number] Date of Birth: 03/12/1961        Patient Gender: F Patient Age:   23 years Exam Location:  [facility] Procedure:      VAS [place] LOWER EXTREMITY VENOUS (DVT) Referring Phys: Frederich Chick LAMA --------------------------------------------------------------------------------  Indications: BLE edema and RLE pain.  Risk Factors: COVID+. Comparison Study: Previous exam 03/08/20 - LLEV negative 09/28/19 BLEV negative                   for DVT Performing Technologist: Jody Hill RVT, RDMS  Examination Guidelines: A  complete evaluation includes B-mode imaging, spectral Doppler, color Doppler, and power Doppler as needed of all accessible portions of each vessel. Bilateral testing is considered an integral part of a complete examination. Limited examinations for reoccurring indications may be performed as noted. The reflux portion of the exam is performed with the patient in reverse Trendelenburg.  +---------+---------------+---------+-----------+----------+-------------------+ RIGHT    CompressibilityPhasicitySpontaneityPropertiesThrombus Aging      +---------+---------------+---------+-----------+----------+-------------------+ CFV      Full           Yes      Yes                  wall thickening     +---------+---------------+---------+-----------+----------+-------------------+ SFJ      Full                                         wall thickening     +---------+---------------+---------+-----------+----------+-------------------+ FV Prox  Full           Yes      Yes                                      +---------+---------------+---------+-----------+----------+-------------------+ FV Mid   Full           Yes      Yes                                      +---------+---------------+---------+-----------+----------+-------------------+ FV DistalFull           Yes      Yes                                      +---------+---------------+---------+-----------+----------+-------------------+ PFV      Full                                                             +---------+---------------+---------+-----------+----------+-------------------+  POP      Full                                                             +---------+---------------+---------+-----------+----------+-------------------+ PTV                     Yes      Yes                  Not well visualized +---------+---------------+---------+-----------+----------+-------------------+ PERO                     Yes      Yes                  Not well visualized +---------+---------------+---------+-----------+----------+-------------------+ only one of paired PeroV and PTV's seen on this exam - patent by color and doppler. Patient unable to tolerate compression due to pain.  +---------+---------------+---------+-----------+----------+--------------+ LEFT     CompressibilityPhasicitySpontaneityPropertiesThrombus Aging +---------+---------------+---------+-----------+----------+--------------+ CFV      Full           Yes      Yes                                 +---------+---------------+---------+-----------+----------+--------------+ SFJ      Full                                                        +---------+---------------+---------+-----------+----------+--------------+ FV Prox  Full           Yes      Yes                                 +---------+---------------+---------+-----------+----------+--------------+ FV Mid   Full           Yes      Yes                                 +---------+---------------+---------+-----------+----------+--------------+ FV DistalFull           Yes      Yes                                 +---------+---------------+---------+-----------+----------+--------------+ PFV      Full                                                        +---------+---------------+---------+-----------+----------+--------------+ POP      Full           Yes      Yes                                 +---------+---------------+---------+-----------+----------+--------------+  PTV      Full                                                        +---------+---------------+---------+-----------+----------+--------------+ PERO     Full                                                        +---------+---------------+---------+-----------+----------+--------------+     Summary: BILATERAL: - No evidence of deep vein thrombosis seen in the  lower extremities, bilaterally. - No evidence of superficial venous thrombosis in the lower extremities, bilaterally. -No evidence of popliteal cyst, bilaterally.   *See table(s) above for measurements and observations. Electronically signed by Deitra Mayo MD on 08-04-21 at 7:13:24 AM.    Final        Antibiotics: Anti-infectives (From admission, onward)    Start     Dose/Rate Route Frequency Ordered Stop   07/15/21 1000  remdesivir 100 mg in sodium chloride 0.9 % 100 mL IVPB       See Hyperspace for full Linked Orders Report.   100 mg 200 mL/hr over 30 Minutes Intravenous Daily 07/26/2021 1004 07/19/21 0959   07/27/2021 1100  remdesivir 200 mg in sodium chloride 0.9% 250 mL IVPB       See Hyperspace for full Linked Orders Report.   200 mg 580 mL/hr over 30 Minutes Intravenous Once 07/03/2021 1004 07/11/2021 1203   07/13/21 1015  molnupiravir EUA (LAGEVRIO) capsule 800 mg  Status:  Discontinued        4 capsule Oral 2 times daily 07/13/21 1002 07/19/2021 1004   07/13/21 1000  nirmatrelvir/ritonavir EUA (renal dosing) (PAXLOVID) 2 tablet  Status:  Discontinued        2 tablet Oral 2 times daily 07/13/21 0307 07/13/21 1002         DVT prophylaxis: Apixaban>> lovenox  Code Status: Full code  Family Communication:  Husband updated by phone   Consultants: PCCM vascular  Procedures: Intubated by PCCM    Objective    Physical Examination:  Patient is currently intubated , chronically ill-appearing.   Diminished air entry at the bases bilaterally, no wheezing or rhonchi.   , Nontender, bowel sounds present  Patient with significant anasarca, but mainly right lower extremity with significant swelling, pulses diminished at periphery, she had bullae with surrounding demises, see picture below.        COVID-19 Labs  Recent Labs    07/15/21 0042 07/15/21 1553 07/16/21 0133 2021-08-04 0457  DDIMER 1.87*  --  1.87* 3.73*  FERRITIN  --  626*  --   --   CRP 27.1*  --   29.2* 22.7*    Lab Results  Component Value Date   SARSCOV2NAA POSITIVE (A) 07/13/2021   SARSCOV2NAA NEGATIVE 03/27/2021   [place] NEGATIVE 03/12/2021   [place] NEGATIVE 08/27/2020            Recent Results (from the past 240 hour(s))  Resp Panel by RT-PCR (Flu A&B, Covid) Nasopharyngeal Swab     Status: Abnormal   Collection Time: 07/13/21  1:03 AM   Specimen: Nasopharyngeal Swab; Nasopharyngeal(NP) swabs in vial transport medium  Result Value  Ref Range Status   SARS Coronavirus 2 by RT PCR POSITIVE (A) NEGATIVE Final    Comment: RESULT CALLED TO, READ BACK BY AND VERIFIED WITH: C COBB,RN@0253  07/13/21 [place] (NOTE) SARS-CoV-2 target nucleic acids are DETECTED.  The SARS-CoV-2 RNA is generally detectable in upper respiratory specimens during the acute phase of infection. Positive results are indicative of the presence of the identified virus, but do not rule out bacterial infection or co-infection with other pathogens not detected by the test. Clinical correlation with patient history and other diagnostic information is necessary to determine patient infection status. The expected result is Negative.  Fact Sheet for Patients: EntrepreneurPulse.com.au  Fact Sheet for Healthcare Providers: IncredibleEmployment.be  This test is not yet approved or cleared by the [place] FDA and  has been authorized for detection and/or diagnosis of SARS-CoV-2 by FDA under an Emergency Use Authorization (EUA).  This EUA will remain in effect (meaning this test can be used)  for the duration of  the COVID-19 declaration under Section 564(b)(1) of the Act, 21 U.S.C. section 360bbb-3(b)(1), unless the authorization is terminated or revoked sooner.     Influenza A by PCR NEGATIVE NEGATIVE Final   Influenza B by PCR NEGATIVE NEGATIVE Final    Comment: (NOTE) The Xpert Xpress SARS-CoV-2/FLU/RSV plus assay is intended as an aid in the  diagnosis of influenza from Nasopharyngeal swab specimens and should not be used as a sole basis for treatment. Nasal washings and aspirates are unacceptable for Xpert Xpress SARS-CoV-2/FLU/RSV testing.  Fact Sheet for Patients: EntrepreneurPulse.com.au  Fact Sheet for Healthcare Providers: IncredibleEmployment.be  This test is not yet approved or cleared by the [place] FDA and has been authorized for detection and/or diagnosis of SARS-CoV-2 by FDA under an Emergency Use Authorization (EUA). This EUA will remain in effect (meaning this test can be used) for the duration of the COVID-19 declaration under Section 564(b)(1) of the Act, 21 U.S.C. section 360bbb-3(b)(1), unless the authorization is terminated or revoked.  Performed at [facility], [place] [address], [place], [place] [address]     Jarrod Mcenery MD   [facility] If 7PM-7AM, please contact night-coverage at [email or web address], Office  [phone]   2021-07-26, 11:09 AM  LOS: 3 days

## 2021-07-30 NOTE — Progress Notes (Signed)
   07/25/2021 1030  Clinical Encounter Type  Visited With Patient not available  Visit Type Code   CH responded to Code Blue alarm on 5W while visiting another pt.  Medical team attending to pt.  No family present at this time.  Chaplains remain available if needed.

## 2021-07-30 NOTE — Progress Notes (Signed)
RRT called for a Routine ABG in 5W30. On arrival, pt's extremities were noted to have poor perfusion, 2 attempts were made in right radial and right brachial. Minimal blood return, blood pressure taken and showed a MAP of 42. Rapid Response called due to acute need for intervention, respirations then assisted via BVM and discussed with MD and decision to pull CODE blue alarm was made for respiratory arrest. No pulses lost but only felt centrally. Pt transported to 3M03 after stabilized without any further complications.

## 2021-07-30 NOTE — Consult Note (Signed)
NAME:  Jodi Bowman, MRN:  [id number], DOB:  [birth year], LOS: 3 ADMISSION DATE:  07/07/2021, CONSULTATION DATE:  07-30-21 REFERRING MD:  Urban Gibson, CHIEF COMPLAINT:  Respiratory arrest   Brief History   Jodi Bowman is a 60 y.o. with a pertinent PMH of OSA, HTN, CVA, gastric bypass 2017, who presented after a syncopal episode. Patient with chronic LE edema and loose stool s/p gastric by pass surgery (2019).  In the ED she was found to be COVID positive.   HPI:   CC: Respiratory Arrest  Jodi Bowman is a 60 y.o. with a pertinent PMH of OSA, HTN, CVA, gastric bypass 2017, who presented after a syncopal episode. Patient with chronic LE edema and loose stool s/p gastric by pass surgery (2019).  In the ED she was found to be COVID positive. Since admission patient developed left facial weakness and leg weakness. Code stroke was called and CT head was performed, which was unremarkable. Thought to be 2/2 to recrudescence of prior stroke. MRI was ordered and pending. On 10/19 the patient became more lethargic with hypotension and AMS.   Since then the patient developed respiratory failure requiring intubation and transfer to the ICU.  CVC access was gained and levo was started. Shortly after, the patient became more hypotensive and bradycardic and developed PEA arrest requiring 9 minutes of chest compressions, epi and bicrab. Since ROSC, patient has been unresponsive off sedation.  Past Medical History   has a past medical history of Arthritis, Calculus of gallbladder without mention of cholecystitis or obstruction, Dizziness, Fibromyalgia, GERD (gastroesophageal reflux disease), Goiter, Hypercholesteremia, Hypertension, Lower back pain, Migraine, Nontoxic uninodular goiter, Obesity, PONV (postoperative nausea and vomiting), Sleep apnea, and Stroke ([place]) (2000). Significant Hospital Events   10/14 admitted for syncope 10/19 respiratory decomp and PEA arrest   Consults:   neurology  Procedures:  10/19 CVC in RT IJ 10/19 ET placed 10/19 Aline placed  Significant Diagnostic Tests:   DG CHEST PORT 1 VIEW  Result Date: 2021/07/30 CLINICAL DATA:  Intubation EXAM: PORTABLE CHEST 1 VIEW COMPARISON:  07/15/2021 FINDINGS: Interval placement of endotracheal tube with distal tip terminating in the right mainstem bronchus. Interval placement of large bore enteric tube, distal tip terminating in the gastric body. Normal heart size. Hazy opacity within the left lung, likely atelectasis. Small bilateral pleural effusions. No pneumothorax. No displaced fracture is identified. IMPRESSION: Interval placement of endotracheal tube with distal tip terminating in the right mainstem bronchus. Retraction of 4-5 cm is recommended. These results will be called to the ordering clinician or representative by the Radiologist Assistant, and communication documented in the PACS or [employer]. Electronically Signed   By: Davina Poke D.O.   On: 07/30/2021 11:24   VAS [place] LOWER EXTREMITY VENOUS (DVT)  Result Date: 30-Jul-2021  Lower Venous DVT Study Patient Name:  Jodi Bowman [facility]  Date of Exam:   07/16/2021 Medical Rec #: [id number]         Accession #:    [id number] Date of Birth: August 25, 1961        Patient Gender: F Patient Age:   26 years Exam Location:  [facility] Procedure:      VAS [place] LOWER EXTREMITY VENOUS (DVT) Referring Phys: Frederich Chick LAMA --------------------------------------------------------------------------------  Indications: BLE edema and RLE pain.  Risk Factors: COVID+. Comparison Study: Previous exam 03/08/20 - LLEV negative 09/28/19 BLEV negative                   for  DVT Performing Technologist: Rogelia Rohrer RVT, RDMS  Examination Guidelines: A complete evaluation includes B-mode imaging, spectral Doppler, color Doppler, and power Doppler as needed of all accessible portions of each vessel. Bilateral testing is considered an integral part of a complete examination.  Limited examinations for reoccurring indications may be performed as noted. The reflux portion of the exam is performed with the patient in reverse Trendelenburg.  +---------+---------------+---------+-----------+----------+-------------------+ RIGHT    CompressibilityPhasicitySpontaneityPropertiesThrombus Aging      +---------+---------------+---------+-----------+----------+-------------------+ CFV      Full           Yes      Yes                  wall thickening     +---------+---------------+---------+-----------+----------+-------------------+ SFJ      Full                                         wall thickening     +---------+---------------+---------+-----------+----------+-------------------+ FV Prox  Full           Yes      Yes                                      +---------+---------------+---------+-----------+----------+-------------------+ FV Mid   Full           Yes      Yes                                      +---------+---------------+---------+-----------+----------+-------------------+ FV DistalFull           Yes      Yes                                      +---------+---------------+---------+-----------+----------+-------------------+ PFV      Full                                                             +---------+---------------+---------+-----------+----------+-------------------+ POP      Full                                                             +---------+---------------+---------+-----------+----------+-------------------+ PTV                     Yes      Yes                  Not well visualized +---------+---------------+---------+-----------+----------+-------------------+ PERO                    Yes      Yes                  Not well visualized +---------+---------------+---------+-----------+----------+-------------------+ only one of paired PeroV and PTV's  seen on this exam - patent by color and doppler.  Patient unable to tolerate compression due to pain.  +---------+---------------+---------+-----------+----------+--------------+ LEFT     CompressibilityPhasicitySpontaneityPropertiesThrombus Aging +---------+---------------+---------+-----------+----------+--------------+ CFV      Full           Yes      Yes                                 +---------+---------------+---------+-----------+----------+--------------+ SFJ      Full                                                        +---------+---------------+---------+-----------+----------+--------------+ FV Prox  Full           Yes      Yes                                 +---------+---------------+---------+-----------+----------+--------------+ FV Mid   Full           Yes      Yes                                 +---------+---------------+---------+-----------+----------+--------------+ FV DistalFull           Yes      Yes                                 +---------+---------------+---------+-----------+----------+--------------+ PFV      Full                                                        +---------+---------------+---------+-----------+----------+--------------+ POP      Full           Yes      Yes                                 +---------+---------------+---------+-----------+----------+--------------+ PTV      Full                                                        +---------+---------------+---------+-----------+----------+--------------+ PERO     Full                                                        +---------+---------------+---------+-----------+----------+--------------+     Summary: BILATERAL: - No evidence of deep vein thrombosis seen in the lower extremities, bilaterally. - No evidence of superficial venous thrombosis in the lower extremities, bilaterally. -No evidence of popliteal cyst, bilaterally.   *See table(s) above for measurements and  observations.  Electronically signed by Deitra Mayo MD on 07-20-2021 at 7:13:24 AM.    Final    Micro Data:  10/14 COVID positive 10/19 Bcx pending 10/19 Ucx pending   Antimicrobials:  10/19 Vanc  10/19 Cefepime 10/17 Remdesivir     Interim History/Subjective:  Patient not responsive s/p PEA arrest.   Objective:  Blood pressure 105/83, pulse 98, temperature 98.7 F (37.1 C), temperature source Axillary, resp. rate 20, height 5\' 2"  (1.575 m), weight 62.2 kg, SpO2 95 %.    Vent Mode: PRVC FiO2 (%):  [100 %] 100 % Set Rate:  [30 bmp] 30 bmp Vt Set:  [400 mL] 400 mL PEEP:  [5 cmH20] 5 cmH20 Plateau Pressure:  [20 cmH20] 20 cmH20   Intake/Output Summary (Last 24 hours) at 20-Jul-2021 1241 Last data filed at 20-Jul-2021 0530 Gross per 24 hour  Intake 1442.67 ml  Output 900 ml  Net 542.67 ml   Filed Weights   07/07/2021 2039 07/13/21 2255 2021/07/20 0500  Weight: 51.3 kg 100.1 kg 62.2 kg    Examination: General: acutely ill patient HEENT: no EOM and sluggish pupils  Lungs: MV sounds Cardiovascular: tachy>brady>pea, no obvious murmurs Abdomen: distended but soft Extremities: cool and decreased pulses Neuro: not responsive. GU: n/a  Patient Lines/Drains/Airways Status     Active Line/Drains/Airways     Name Placement date Placement time Site Days   Peripheral IV 07/15/21 20 G 1.88" Left Antecubital 07/15/21  0923  Antecubital  2   Peripheral IV 07/15/21 20 G 1.88" Anterior;Upper;Right Arm 07/15/21  1317  Arm  2   CVC Triple Lumen July 20, 2021 Left Internal jugular 07-20-2021  1130  -- less than 1   Urethral Catheter Double-lumen 16 Fr. 07/13/21  1003  Double-lumen  4   Airway 7.5 mm 07-20-21  1030  -- less than 1   Incision (Closed) 03/04/19 Back 03/04/19  1135  -- 866   Incision (Closed) 04/03/19 Back 04/03/19  0903  -- 836   Incision - 3 Ports Abdomen Umbilicus Right;Medial Right;Lateral 03/06/18  --  -- 1229   Small Bore Feeding Tube 10 Fr. Left nare Marking at nare/corner of  mouth 66 cm July 20, 2021  1002  Left nare  less than 1            Resolved Hospital Problem list   N/a  Assessment & Plan:   Acute hypoxic respiratory failure s/p intubation: Possible aspiration to to resp compromise due to acidosis: COVID 19 pneumonia: Concern for severe metabolic acidosis driving acute decompensation and respiratory arrest. Possibly 2/2 to underlying septic shock. Concern for possible aspiration event. Will get CXR today - Titrate ventilator to SpO2 >92% - Will Get CXR and ABG now.  - Pulm hygiene  - VAP protocol - PAD protocol  - SBT daily when possible - Will need brain imaging to eval  Septic Shock: - unknown source - Will replace foley cath - Start broad spectrum ab  - Bcx pending  Cardiac Arrest, PEA 2x: - Poor/guarded prognosis - Patient achieved ROSC x2 but unresponsive off sedation  - Will need repeat imaging.   Best Practice:  Diet: NPO Pain/Anxiety/Delirium protocol (if indicated): yes,  VAP protocol (if indicated): yes DVT prophylaxis: not indicated GI prophylaxis: PPI Glucose control: SSI Yes Lines: Central line Foley:  Yes, and it is still needed Mobility: bed rest, Code Status: DNR Family Communication: yes Disposition: ICU  Labs   CBC: Recent Labs  Lab 06/30/2021 2058 07/10/2021 0132 07/15/21 0042 07/16/21 0133 07/20/21  0457  WBC 7.5 10.8* 11.1* 8.4 3.1*  NEUTROABS 5.3 9.8* 10.4* 7.1 1.0*  HGB 8.7* 8.7* 10.2* 9.0* 9.3*  HCT 26.3* 25.6* 30.1* 26.8* 28.4*  MCV 98.1 95.9 98.0 98.2 99.0  PLT 282 277 233 173 45*    Basic Metabolic Panel: Recent Labs  Lab 07/13/2021 2058 07/13/21 0120 07/15/2021 0132 07/15/21 0042 07/16/21 0133 07/16/21 0859 07/16/21 1649 23-Jul-2021 0457  NA 138  --  139 138 136  --   --  129*  K 2.7*  --  3.5 3.3* 4.3  --   --  3.8  CL 105  --  106 105 104  --   --  100  CO2 27  --  25 22 21*  --   --  11*  GLUCOSE 89  --  81 84 80 81  --  89  BUN 12  --  13 15 19   --   --  23  CREATININE 0.91  --   0.85 1.05* 1.19*  --   --  1.52*  CALCIUM 7.4*  --  7.5* 7.8* 7.3*  --   --  6.8*  MG  --  1.3* 1.8  --   --   --  1.6* 1.5*  PHOS  --   --   --   --   --   --  2.1* 1.7*   GFR: Estimated Creatinine Clearance: 33.7 mL/min (A) (by C-G formula based on SCr of 1.52 mg/dL (H)). Recent Labs  Lab 07/11/2021 0132 07/15/21 0042 07/16/21 0133 23-Jul-2021 0457  WBC 10.8* 11.1* 8.4 3.1*    Liver Function Tests: Recent Labs  Lab 07/13/21 0120 07/11/2021 0132 07/15/21 0042 07/16/21 0133 July 23, 2021 0457  AST 23 32 35 28 26  ALT 34 31 35 24 20  ALKPHOS 74 65 71 68 48  BILITOT 2.9* 1.5* 1.3* 0.9 0.8  PROT 4.8* 4.4* 4.9* 3.8* <3.0*  ALBUMIN 1.6* <1.5* <1.5* <1.5* <1.5*   No results for input(s): LIPASE, AMYLASE in the last 168 hours. No results for input(s): AMMONIA in the last 168 hours.  ABG    Component Value Date/Time   TCO2 25 03/27/2021 1429     Coagulation Profile: Recent Labs  Lab 07/13/21 0405  INR 1.1    Cardiac Enzymes: No results for input(s): CKTOTAL, CKMB, CKMBINDEX, TROPONINI in the last 168 hours.  HbA1C: Hgb A1c MFr Bld  Date/Time Value Ref Range Status  03/12/2021 07:33 PM 4.3 (L) 4.8 - 5.6 % Final    Comment:    (NOTE)         Prediabetes: 5.7 - 6.4         Diabetes: >6.4         Glycemic control for adults with diabetes: <7.0   06/29/2020 02:31 PM 4.7 (L) 4.8 - 5.6 % Final    Comment:    (NOTE) Pre diabetes:          5.7%-6.4%  Diabetes:              >6.4%  Glycemic control for   <7.0% adults with diabetes     CBG: Recent Labs  Lab 07/16/21 2348 July 23, 2021 0104 23-Jul-2021 0438 Jul 23, 2021 0806 07-23-21 1213  GLUCAP 67* 85 91 82 42*    Review of Systems:   See Above.  Past Medical History  She,  has a past medical history of Arthritis, Calculus of gallbladder without mention of cholecystitis or obstruction, Dizziness, Fibromyalgia, GERD (gastroesophageal reflux disease), Goiter, Hypercholesteremia, Hypertension, Lower  back pain, Migraine,  Nontoxic uninodular goiter, Obesity, PONV (postoperative nausea and vomiting), Sleep apnea, and Stroke ([place]) (2000).   Surgical History    Past Surgical History:  Procedure Laterality Date   ABDOMINAL HYSTERECTOMY     BOWEL RESECTION N/A 03/06/2018   Procedure: SMALL BOWEL ANASTAMOSIS;  Surgeon: Clovis Riley, MD;  Location: [place];  Service: General;  Laterality: N/A;   CATARACT EXTRACTION W/ INTRAOCULAR LENS IMPLANT Bilateral    CESAREAN SECTION  yrs ago   done x 2   CHOLECYSTECTOMY  01/05/2012   Procedure: LAPAROSCOPIC CHOLECYSTECTOMY WITH INTRAOPERATIVE CHOLANGIOGRAM;  Surgeon: Pedro Earls, MD;  Location: [facility];  Service: General;  Laterality: N/A;   COLONOSCOPY  10/08/2012   Procedure: COLONOSCOPY;  Surgeon: Beryle Beams, MD;  Location: [facility];  Service: Endoscopy;  Laterality: N/A;   KNEE ARTHROSCOPY  one 1995 and 1 in 1997   both knees done   LAPAROSCOPY N/A 03/06/2018   Procedure: LAPAROSCOPY DIAGNOSTIC WITH LYSIS OF ADHESIONS;  Surgeon: Clovis Riley, MD;  Location: [place];  Service: General;  Laterality: N/A;   LAPAROTOMY N/A 03/06/2018   Procedure: EXPLORATORY LAPAROTOMY, RESECTION OF DISTAL ROUX, CLOSURE OF INTERNAL HERNIA;  Surgeon: Clovis Riley, MD;  Location: [place];  Service: General;  Laterality: N/A;   LEFT HEART CATH AND CORONARY ANGIOGRAPHY N/A 08/29/2019   Procedure: LEFT HEART CATH AND CORONARY ANGIOGRAPHY;  Surgeon: Charolette Forward, MD;  Location: [place] [facility];  Service: Cardiovascular;  Laterality: N/A;   LUMBAR WOUND DEBRIDEMENT N/A 04/03/2019   Procedure: LUMBAR WOUND WASHOUT;  Surgeon: Judith Part, MD;  Location: [place];  Service: Neurosurgery;  Laterality: N/A;   POSTERIOR LUMBAR FUSION  03/04/2019   surgery for endometriosis  yrs ago   TEE WITHOUT CARDIOVERSION N/A 03/22/2021   Procedure: TRANSESOPHAGEAL ECHOCARDIOGRAM (TEE);  Surgeon: Skeet Latch, MD;  [facility];  Service: Cardiovascular;  Laterality: N/A;    thryoid biopsy  December 01, 2011    at [place]   reports that she has never smoked. She has never used smokeless tobacco. She reports that she does not currently use alcohol. She reports that she does not use drugs.   Family History   Her family history includes Cancer in her brother and maternal grandmother; Dementia in her mother; Diabetes in her mother; Hypertension in her mother; Neuropathy in her brother, mother, and sister; Other in her father; Seizures in her mother; Transient ischemic attack in her mother.   Allergies Allergies  Allergen Reactions   Shrimp [Shellfish Allergy] Anaphylaxis   Naproxen Other (See Comments)    Makes stomach cramp and burn badly   Shellfish-Derived Products     Other reaction(s): GI Upset (intolerance)     Home Medications  Prior to Admission medications   Medication Sig Start Date End Date Taking? Authorizing Provider  acetaminophen (TYLENOL) 500 MG tablet Take 500-1,000 mg by mouth every 6 (six) hours as needed for moderate pain or headache.   Yes [provider]  albuterol (VENTOLIN HFA) 108 (90 Base) MCG/ACT inhaler Inhale 1-2 puffs into the lungs every 6 (six) hours as needed for wheezing or shortness of breath.   Yes [provider]  aspirin EC 81 MG tablet Take 81 mg by mouth daily.   Yes [provider]  D3-50 1.25 MG (50000 UT) capsule Take 50,000 Units by mouth every Monday, Wednesday, and Friday. 05/06/20  Yes [provider]  diclofenac Sodium (  VOLTAREN) 1 % GEL Apply 4 g topically 2 (two) times daily as needed (back pain).   Yes [provider]  DULoxetine (CYMBALTA) 60 MG capsule Take 60 mg by mouth daily. 08/11/19  Yes [provider]  escitalopram (LEXAPRO) 10 MG tablet Take 10 mg by mouth at bedtime. 05/04/20  Yes [provider]  ferrous sulfate 325 (65 FE) MG EC tablet Take 325 mg by mouth daily. 07/26/19  Yes [provider]  fluticasone (FLONASE) 50  MCG/ACT nasal spray Place 1 spray into both nostrils daily as needed for allergies.  11/08/17  Yes [provider]  folic acid (FOLVITE) 1 MG tablet Take 1 mg by mouth daily. 10/14/17  Yes [provider]  HYDROcodone-acetaminophen (NORCO) 10-325 MG tablet Take 1 tablet by mouth 2 (two) times daily as needed for moderate pain or severe pain. 02/16/20  Yes [provider]  loratadine (CLARITIN) 10 MG tablet Take 10 mg by mouth daily.    Yes [provider]  losartan (COZAAR) 50 MG tablet Take 50 mg by mouth daily. 05/17/21  Yes [provider]  Magnesium Oxide 400 MG CAPS Take 1 capsule (400 mg total) by mouth daily. 09/28/19  Yes Carlisle Cater, PA-C  metoprolol succinate (TOPROL-XL) 100 MG 24 hr tablet Take 100 mg by mouth at bedtime.  07/10/19  Yes [provider]  mirtazapine (REMERON) 7.5 MG tablet Take 7.5 mg by mouth at bedtime. 07/30/20  Yes [provider]  morphine (MS CONTIN) 15 MG 12 hr tablet Take 15 mg by mouth in the morning and at bedtime. 06/12/21  Yes [provider]  ondansetron (ZOFRAN-ODT) 4 MG disintegrating tablet Take 4 mg by mouth every 8 (eight) hours as needed for nausea/vomiting. 06/07/20  Yes [provider]  pantoprazole (PROTONIX) 40 MG tablet Take 1 tablet (40 mg total) by mouth 2 (two) times daily. 03/15/19  Yes Angiulli, Lavon Paganini, PA-C  polyethylene glycol (MIRALAX / GLYCOLAX) 17 g packet Take 17 g by mouth daily as needed for mild constipation. 03/08/19  Yes Viona Gilmore D, NP  spironolactone (ALDACTONE) 25 MG tablet Take 1 tablet (25 mg total) by mouth daily. 04/29/21 07/13/21 Yes Deno Etienne, DO  APIXABAN Arne [place]) VTE STARTER PACK (10MG  AND 5MG ) Take as directed on package: start with two-5mg  tablets twice daily for 7 days. On day 8, switch to one-5mg  tablet twice daily. Patient not taking: No sig reported 04/15/21   Pattricia Boss, MD  clindamycin (CLEOCIN) 300 MG capsule Take 1 capsule (300 mg  total) by mouth 4 (four) times daily. X 7 days Patient not taking: No sig reported 04/29/21   Deno Etienne, DO  erythromycin ophthalmic ointment Place a 1/2 inch ribbon of ointment into the lower eyelid four times a day for 7 days Patient not taking: No sig reported 04/29/21   Deno Etienne, DO  furosemide (LASIX) 40 MG tablet Take 40 mg by mouth See admin instructions. Qd x 5 days Patient not taking: Reported on 07/13/2021 06/12/21   [provider]  isosorbide mononitrate (IMDUR) 30 MG 24 hr tablet Take 1 tablet (30 mg total) by mouth daily. Patient not taking: No sig reported 11/18/19   Krista Blue L, PA-C  KLOR-CON M20 20 MEQ tablet Take 20 mEq by mouth See admin instructions. Qd x 10 days Patient not taking: Reported on 07/13/2021 06/12/21   [provider]  pregabalin (LYRICA) 75 MG capsule Take 1 capsule (75 mg total) by mouth 3 (three) times daily.  Patient not taking: No sig reported 03/15/19   Angiulli, Lavon Paganini, PA-C  metoprolol (LOPRESSOR) 50 MG tablet Take 50 mg by mouth 2 (two) times daily.    12/04/11  [provider]  simvastatin (ZOCOR) 20 MG tablet Take 20 mg by mouth at bedtime.    12/04/11  [provider]     Critical care time: [place], D.O.  Internal Medicine Resident, PGY-3 [facility]  Pager: [phone] 12:41 PM, August 11, 2021

## 2021-07-30 NOTE — Progress Notes (Signed)
  Echocardiogram 2D Echocardiogram has been performed.  Jodi Bowman 08/01/2021, 3:14 PM

## 2021-07-30 NOTE — Procedures (Signed)
Intubation Procedure Note  CLOVIA REINE  [id number]  1960-12-24  Date:24-Jul-2021  Time:11:17 AM   Provider Performing:Sayaka Hoeppner R Keyairra Kolinski    Procedure: Intubation (03403)  Indication(s) Respiratory Failure  Consent Unable to obtain consent due to emergent nature of procedure.   Anesthesia Etomidate and Rocuronium   Time Out Verified patient identification, verified procedure, site/side was marked, verified correct patient position, special equipment/implants available, medications/allergies/relevant history reviewed, required imaging and test results available.   Sterile Technique Usual hand hygeine, masks, and gloves were used   Procedure Description Patient positioned in bed supine.  Sedation given as noted above.  Patient was intubated with endotracheal tube using Glidescope.  View was Grade 2 only posterior commissure .  Number of attempts was 1.  Colorimetric CO2 detector was consistent with tracheal placement.   Complications/Tolerance None; patient tolerated the procedure well. Chest X-ray is ordered to verify placement.   EBL none   Specimen(s) None

## 2021-07-30 DEATH — deceased

## 2021-08-08 DIAGNOSIS — R6521 Severe sepsis with septic shock: Secondary | ICD-10-CM | POA: Diagnosis not present

## 2021-08-08 DIAGNOSIS — E872 Acidosis, unspecified: Secondary | ICD-10-CM

## 2021-08-08 DIAGNOSIS — Z515 Encounter for palliative care: Secondary | ICD-10-CM

## 2021-08-08 DIAGNOSIS — J9601 Acute respiratory failure with hypoxia: Secondary | ICD-10-CM

## 2021-08-08 DIAGNOSIS — A419 Sepsis, unspecified organism: Secondary | ICD-10-CM | POA: Diagnosis not present

## 2021-08-08 DIAGNOSIS — Z66 Do not resuscitate: Secondary | ICD-10-CM | POA: Diagnosis not present

## 2021-08-08 DIAGNOSIS — R627 Adult failure to thrive: Secondary | ICD-10-CM | POA: Diagnosis present

## 2021-08-19 MED FILL — Medication: Qty: 1 | Status: AC

## 2021-08-29 NOTE — Death Summary Note (Signed)
DEATH SUMMARY   Patient Details  Name: Jodi Bowman MRN: [id number] DOB: 02-27-1961  Admission/Discharge Information   Admit Date:  August 03, 2021  Date of Death: Date of Death: August 08, 2021  Time of Death: Time of Death: 04-Jan-1899  Length of Stay: 3  Referring Physician: Aletha Halim., PA-C   Reason(s) for Hospitalization  Failure to thrive in adult  Diagnoses  Preliminary cause of death:  Secondary Diagnoses (including complications and co-morbidities):  Principal Problem:   Septic shock ([place]) Active Problems:   Anasarca   COVID-19 virus infection   Protein-calorie malnutrition, severe   Failure to thrive in adult   DNR (do not resuscitate)   Comfort measures only status   Acute hypoxemic respiratory failure ([place])   Lactic acidosis   Brief Hospital Course (including significant findings, care, treatment, and services provided and events leading to death)  Jodi Bowman is a 60 y.o. year old female who was admitted with deconditioning, severe protein calorie malnutrition, failure to thrive.  Incidentally COVID-positive.  Difficulty taking intake.  Question of potential small bowel or partial small bowel obstruction.  Unable to take p.o.  NG in place for deep compression.  4-5 days into admission she was noted to be a bit encephalopathic.  This was in the morning.  Notably she has several electrolyte derangements on morning labs.  PCCM was consulted.  At time of my evaluation a CODE BLUE had been called.  Subsequently she was intubated.  She is encephalopathic.  GCS of 3.  Profoundly hypotensive.  Multiple pressors going through I/O.  I so so appeared to have failed.  Switched to peripheral pressors with improvement in blood pressures temporarily.  She was transferred to the ICU.  Placed on broad-spectrum antibiotics given concern for septic shock.  Escalate to 3 vasopressors.  High doses.  Central line was placed.  Arterial line was placed.  Subsequent blood cultures notable for  strep pyogenes and MRSA bacteremia.  She had multiple CODE BLUE resuscitation including CPR and epinephrine in intensive care unit.  Extremely unstable.  Showed early signs of severe anoxic encephalopathy.  Discussed with family, son and husband.  Decision was made to make her comfort care.  Effort to have family visit.  Unfortunatel, she passed a few hours later on full support at 1900 on 08-08-21.    Pertinent Labs and Studies  Significant Diagnostic Studies CT Head Wo Contrast  Result Date: 07/13/2021 CLINICAL DATA:  Head/neck trauma EXAM: CT HEAD WITHOUT CONTRAST CT CERVICAL SPINE WITHOUT CONTRAST TECHNIQUE: Multidetector CT imaging of the head and cervical spine was performed following the standard protocol without intravenous contrast. Multiplanar CT image reconstructions of the cervical spine were also generated. COMPARISON:  03/27/2021 FINDINGS: CT HEAD FINDINGS Brain: No evidence of acute infarction, hemorrhage, hydrocephalus, extra-axial collection or mass lesion/mass effect. Mild subcortical white matter and periventricular small vessel ischemic changes. Vascular: No hyperdense vessel or unexpected calcification. Skull: Normal. Negative for fracture or focal lesion. Sinuses/Orbits: The visualized paranasal sinuses are essentially clear. The mastoid air cells are unopacified. Other: None. CT CERVICAL SPINE FINDINGS Alignment: Normal cervical lordosis. Skull base and vertebrae: No acute fracture. No primary bone lesion or focal pathologic process. Soft tissues and spinal canal: No prevertebral fluid or swelling. No visible canal hematoma. Disc levels: Mild degenerative changes of the mid cervical spine. Spinal canal is patent Upper chest: 2.4 cm right thyroid nodule with coarse calcification (series 4/image 90), chronic. This was previously evaluated on remote thyroid biopsy. This has been evaluated  on previous imaging. (ref: J Am Coll Radiol. 2015 Feb;12(2): 143-50). Other: Visualized lungs are  notable for layering moderate right and trace left pleural effusions. IMPRESSION: No evidence of acute intracranial abnormality. Mild small vessel ischemic changes. No evidence of traumatic injury to the cervical spine. Mild degenerative changes. Layering moderate right and trace left pleural effusions. Electronically Signed   By: Julian Hy M.D.   On: 07/13/2021 00:34   CT Cervical Spine Wo Contrast  Result Date: 07/13/2021 CLINICAL DATA:  Head/neck trauma EXAM: CT HEAD WITHOUT CONTRAST CT CERVICAL SPINE WITHOUT CONTRAST TECHNIQUE: Multidetector CT imaging of the head and cervical spine was performed following the standard protocol without intravenous contrast. Multiplanar CT image reconstructions of the cervical spine were also generated. COMPARISON:  03/27/2021 FINDINGS: CT HEAD FINDINGS Brain: No evidence of acute infarction, hemorrhage, hydrocephalus, extra-axial collection or mass lesion/mass effect. Mild subcortical white matter and periventricular small vessel ischemic changes. Vascular: No hyperdense vessel or unexpected calcification. Skull: Normal. Negative for fracture or focal lesion. Sinuses/Orbits: The visualized paranasal sinuses are essentially clear. The mastoid air cells are unopacified. Other: None. CT CERVICAL SPINE FINDINGS Alignment: Normal cervical lordosis. Skull base and vertebrae: No acute fracture. No primary bone lesion or focal pathologic process. Soft tissues and spinal canal: No prevertebral fluid or swelling. No visible canal hematoma. Disc levels: Mild degenerative changes of the mid cervical spine. Spinal canal is patent Upper chest: 2.4 cm right thyroid nodule with coarse calcification (series 4/image 90), chronic. This was previously evaluated on remote thyroid biopsy. This has been evaluated on previous imaging. (ref: J Am Coll Radiol. 2015 Feb;12(2): 143-50). Other: Visualized lungs are notable for layering moderate right and trace left pleural effusions.  IMPRESSION: No evidence of acute intracranial abnormality. Mild small vessel ischemic changes. No evidence of traumatic injury to the cervical spine. Mild degenerative changes. Layering moderate right and trace left pleural effusions. Electronically Signed   By: Julian Hy M.D.   On: 07/13/2021 00:34   US RENAL  Result Date: 07/13/2021 CLINICAL DATA:  Urinary retention EXAM: RENAL / URINARY TRACT ULTRASOUND COMPLETE COMPARISON:  None. FINDINGS: Right Kidney: Renal measurements: 8.4 x 5.1 x 3.8 cm = volume: 87 mL. Increased renal cortical echogenicity. Simple cyst. No mass or hydronephrosis visualized. Left Kidney: Renal measurements: 8.4 x 5.2 x 3.6 cm = volume: 82 mL. Increased renal cortical echogenicity. No mass or hydronephrosis visualized. Bladder: Appears normal for degree of bladder distention. Other: None. IMPRESSION: Increased renal cortical echogenicity, in keeping with medical renal disease. No hydronephrosis. Electronically Signed   By: Delanna Ahmadi M.D.   On: 07/13/2021 12:02   DG CHEST PORT 1 VIEW  Result Date: 08/01/2021 CLINICAL DATA:  Central line placement EXAM: PORTABLE CHEST 1 VIEW COMPARISON:  08/01/2021 FINDINGS: Endotracheal tube tip 5 cm above the carina. Soft feeding tube enters the stomach. Left internal jugular central line tip in the SVC 4 cm above the right atrium. No pneumothorax. Mild volume loss in the lower lobes, left more than right. IMPRESSION: Left internal jugular central line tip in the SVC 4 cm above the right atrium. No pneumothorax. Persistent lower lobe volume loss left worse than right. Electronically Signed   By: Nelson Chimes M.D.   On: 2021-08-01 12:38   DG CHEST PORT 1 VIEW  Result Date: 08-01-2021 CLINICAL DATA:  Intubation EXAM: PORTABLE CHEST 1 VIEW COMPARISON:  06/29/2021 FINDINGS: Interval placement of endotracheal tube with distal tip terminating in the right mainstem bronchus. Interval placement of large bore  enteric tube, distal tip  terminating in the gastric body. Normal heart size. Hazy opacity within the left lung, likely atelectasis. Small bilateral pleural effusions. No pneumothorax. No displaced fracture is identified. IMPRESSION: Interval placement of endotracheal tube with distal tip terminating in the right mainstem bronchus. Retraction of 4-5 cm is recommended. These results will be called to the ordering clinician or representative by the Radiologist Assistant, and communication documented in the PACS or [employer]. Electronically Signed   By: Davina Poke D.O.   On: 2021/08/11 11:24   DG Chest Portable 1 View  Result Date: 06/29/2021 CLINICAL DATA:  Shortness of breath and hypertension. EXAM: PORTABLE CHEST 1 VIEW COMPARISON:  April 29, 2021 FINDINGS: Mild atelectasis is seen within the right lung base. A small right pleural effusion is also seen. No pneumothorax is identified. The heart size and mediastinal contours are within normal limits. Radiopaque surgical clips are seen within the right upper quadrant. A spinal stimulator wire is seen with its distal tip overlying the T10 vertebral body. IMPRESSION: 1. Mild right basilar atelectasis. 2. Small right pleural effusion. Electronically Signed   By: Virgina Norfolk M.D.   On: 07/29/2021 21:46   DG Chest Port 1V same Day  Result Date: 07/22/2021 CLINICAL DATA:  Dyspnea EXAM: PORTABLE CHEST 1 VIEW COMPARISON:  07/04/2021 FINDINGS: Stable heart size. Low lung volumes. Increasing bilateral pleural effusions with associated bibasilar opacities. No pneumothorax. IMPRESSION: Increasing bilateral pleural effusions with associated bibasilar opacities, likely atelectasis. Electronically Signed   By: Davina Poke D.O.   On: 07/15/2021 15:00   CT HEAD CODE STROKE WO CONTRAST  Result Date: 07/29/2021 CLINICAL DATA:  Code stroke.  Acute neurologic deficit EXAM: CT HEAD WITHOUT CONTRAST TECHNIQUE: Contiguous axial images were obtained from the base of the skull  through the vertex without intravenous contrast. COMPARISON:  None. FINDINGS: Brain: There is no mass, hemorrhage or extra-axial collection. The size and configuration of the ventricles and extra-axial CSF spaces are normal. The brain parenchyma is normal, without evidence of acute or chronic infarction. Vascular: No abnormal hyperdensity of the major intracranial arteries or dural venous sinuses. No intracranial atherosclerosis. Skull: The visualized skull base, calvarium and extracranial soft tissues are normal. Sinuses/Orbits: No fluid levels or advanced mucosal thickening of the visualized paranasal sinuses. No mastoid or middle ear effusion. The orbits are normal. ASPECTS [facility] Stroke Program Early CT Score) - Ganglionic level infarction (caudate, lentiform nuclei, internal capsule, insula, M1-M3 cortex): 7 - Supraganglionic infarction (M4-M6 cortex): 3 Total score (0-10 with 10 being normal): 10 IMPRESSION: 1. Normal head CT. 2. ASPECTS is 10. These results were communicated to Dr. Donnetta Simpers at 9:11 pm on 07/26/2021 by text page via the [facility] messaging system. Electronically Signed   By: Ulyses Jarred M.D.   On: 07/05/2021 21:12   VAS [place] LOWER EXTREMITY VENOUS (DVT)  Result Date: 08/11/2021  Lower Venous DVT Study Patient Name:  [facility]  Date of Exam:   07/16/2021 Medical Rec #: [id number]         Accession #:    [id number] Date of Birth: 01-Sep-1961        Patient Gender: F Patient Age:   17 years Exam Location:  [facility] Procedure:      VAS [place] LOWER EXTREMITY VENOUS (DVT) Referring Phys: Frederich Chick LAMA --------------------------------------------------------------------------------  Indications: BLE edema and RLE pain.  Risk Factors: COVID+. Comparison Study: Previous exam 03/08/20 - LLEV negative 09/28/19 BLEV negative  for DVT Performing Technologist: Jody Hill RVT, RDMS  Examination Guidelines: A complete evaluation includes B-mode imaging, spectral Doppler,  color Doppler, and power Doppler as needed of all accessible portions of each vessel. Bilateral testing is considered an integral part of a complete examination. Limited examinations for reoccurring indications may be performed as noted. The reflux portion of the exam is performed with the patient in reverse Trendelenburg.  +---------+---------------+---------+-----------+----------+-------------------+ RIGHT    CompressibilityPhasicitySpontaneityPropertiesThrombus Aging      +---------+---------------+---------+-----------+----------+-------------------+ CFV      Full           Yes      Yes                  wall thickening     +---------+---------------+---------+-----------+----------+-------------------+ SFJ      Full                                         wall thickening     +---------+---------------+---------+-----------+----------+-------------------+ FV Prox  Full           Yes      Yes                                      +---------+---------------+---------+-----------+----------+-------------------+ FV Mid   Full           Yes      Yes                                      +---------+---------------+---------+-----------+----------+-------------------+ FV DistalFull           Yes      Yes                                      +---------+---------------+---------+-----------+----------+-------------------+ PFV      Full                                                             +---------+---------------+---------+-----------+----------+-------------------+ POP      Full                                                             +---------+---------------+---------+-----------+----------+-------------------+ PTV                     Yes      Yes                  Not well visualized +---------+---------------+---------+-----------+----------+-------------------+ PERO                    Yes      Yes                  Not well visualized  +---------+---------------+---------+-----------+----------+-------------------+ only one of paired PeroV  and PTV's seen on this exam - patent by color and doppler. Patient unable to tolerate compression due to pain.  +---------+---------------+---------+-----------+----------+--------------+ LEFT     CompressibilityPhasicitySpontaneityPropertiesThrombus Aging +---------+---------------+---------+-----------+----------+--------------+ CFV      Full           Yes      Yes                                 +---------+---------------+---------+-----------+----------+--------------+ SFJ      Full                                                        +---------+---------------+---------+-----------+----------+--------------+ FV Prox  Full           Yes      Yes                                 +---------+---------------+---------+-----------+----------+--------------+ FV Mid   Full           Yes      Yes                                 +---------+---------------+---------+-----------+----------+--------------+ FV DistalFull           Yes      Yes                                 +---------+---------------+---------+-----------+----------+--------------+ PFV      Full                                                        +---------+---------------+---------+-----------+----------+--------------+ POP      Full           Yes      Yes                                 +---------+---------------+---------+-----------+----------+--------------+ PTV      Full                                                        +---------+---------------+---------+-----------+----------+--------------+ PERO     Full                                                        +---------+---------------+---------+-----------+----------+--------------+     Summary: BILATERAL: - No evidence of deep vein thrombosis seen in the lower extremities, bilaterally. - No evidence of superficial  venous thrombosis in the lower extremities, bilaterally. -No evidence of popliteal cyst, bilaterally.   *See table(s) above for  measurements and observations. Electronically signed by Deitra Mayo MD on July 26, 2021 at 7:13:24 AM.    Final    ECHOCARDIOGRAM LIMITED  Result Date: 26-Jul-2021    ECHOCARDIOGRAM LIMITED REPORT   Patient Name:   ADRA SHEPLER [facility] Date of Exam: Jul 26, 2021 Medical Rec #:  [id number]        Height:       62.0 in Accession #:    [id number]       Weight:       137.1 lb Date of Birth:  04-Apr-1961       BSA:          1.628 m Patient Age:    34 years         BP:           109/86 mmHg Patient Gender: F                HR:           105 bpm. Exam Location:  Inpatient Procedure: Limited Echo, Cardiac Doppler and Color Doppler STAT ECHO Indications:    Pericardial effusion  History:        Patient has prior history of Echocardiogram examinations, most                 recent 03/14/2021. Signs/Symptoms:Shortness of Breath; Risk                 Factors:Sleep Apnea and Hypertension. Hx stroke.  Sonographer:    Clayton Lefort RDCS (AE) Referring Phys: [place]  1. Left ventricular ejection fraction, by estimation, is 20 to 25%. The left ventricle has severely decreased function. The left ventricle demonstrates global hypokinesis.  2. The right ventricle lack full expansion but no evidence of diastolic collapse. The right ventricular size is normal.  3. There is blood noted in the pericardial space with appears to have formed clot around the apex of the right and left ventricle. Small pericardial effusion is present. The pericardial effusion is circumferential. There is no evidence of cardiac tamponade.  4. The mitral valve is normal in structure. No evidence of mitral valve regurgitation. No evidence of mitral stenosis.  5. The aortic valve is normal in structure. Aortic valve regurgitation is not visualized. No aortic stenosis is present. FINDINGS  Left Ventricle: Left  ventricular ejection fraction, by estimation, is 20 to 25%. The left ventricle has severely decreased function. The left ventricle demonstrates global hypokinesis. The left ventricular internal cavity size was normal in size. There is no left ventricular hypertrophy. Right Ventricle: The right ventricle lack full expansion but no evidence of diastolic collapse. The right ventricular size is normal. No increase in right ventricular wall thickness. Left Atrium: Left atrial size was normal in size. Right Atrium: Right atrial size was normal in size. Pericardium: There is blood noted in the pericardial space with appears to have formed clot around the apex of the right and left ventricle. A small pericardial effusion is present. The pericardial effusion is circumferential. There is no evidence of cardiac tamponade. Mitral Valve: The mitral valve is normal in structure. No evidence of mitral valve stenosis. Tricuspid Valve: The tricuspid valve is normal in structure. Tricuspid valve regurgitation is not demonstrated. No evidence of tricuspid stenosis. Aortic Valve: The aortic valve is normal in structure. Aortic valve regurgitation is not visualized. No aortic stenosis is present. Pulmonic Valve: The pulmonic valve was normal in structure. Pulmonic valve regurgitation is not visualized. No evidence of pulmonic stenosis.  Aorta: The aortic root is normal in size and structure. Venous: IVC assessment for right atrial pressure unable to be performed due to mechanical ventilation. IAS/Shunts: No atrial level shunt detected by color flow Doppler. LEFT VENTRICLE PLAX 2D LVIDd:         3.60 cm   Diastology LVIDs:         3.20 cm   LV e' medial:   3.26 cm/s LV PW:         0.80 cm   LV E/e' medial: 14.8 LV IVS:        0.70 cm LVOT diam:     1.90 cm LVOT Area:     2.84 cm  LEFT ATRIUM         Index LA diam:    2.10 cm 1.29 cm/m   AORTA Ao Root diam: 2.80 cm MITRAL VALVE               TRICUSPID VALVE MV Area (PHT): 4.29 cm    TR  Peak grad:   11.2 mmHg MV Decel Time: 177 msec    TR Vmax:        167.00 cm/s MV E velocity: 48.30 cm/s MV A velocity: 55.80 cm/s  SHUNTS MV E/A ratio:  0.87        Systemic Diam: 1.90 cm Kardie Tobb DO Electronically signed by Berniece Salines DO Signature Date/Time: 08/12/21/3:39:53 PM    Final     Microbiology No results found for this or any previous visit (from the past 240 hour(s)).  Lab Basic Metabolic Panel: No results for input(s): NA, K, CL, CO2, GLUCOSE, BUN, CREATININE, CALCIUM, MG, PHOS in the last 168 hours. Liver Function Tests: No results for input(s): AST, ALT, ALKPHOS, BILITOT, PROT, ALBUMIN in the last 168 hours. No results for input(s): LIPASE, AMYLASE in the last 168 hours. No results for input(s): AMMONIA in the last 168 hours. CBC: No results for input(s): WBC, NEUTROABS, HGB, HCT, MCV, PLT in the last 168 hours. Cardiac Enzymes: No results for input(s): CKTOTAL, CKMB, CKMBINDEX, TROPONINI in the last 168 hours. Sepsis Labs: No results for input(s): PROCALCITON, WBC, LATICACIDVEN in the last 168 hours.  Procedures/Operations  Intubation, central line placement, arterial line placement   Bonna Gains Ayaz Sondgeroth 08/08/2021, 2:52 PM

## 2022-01-01 ENCOUNTER — Ambulatory Visit: Payer: Medicare (Managed Care) | Admitting: Neurology

## 2022-02-11 IMAGING — CT CT HEAD CODE STROKE
4 series · 16 of 47 positions shown, 18 images · non-contrast
Comparison: CT head August 27, 2020

CLINICAL DATA: Code stroke.  Neuro deficit, acute stroke suspected.

EXAM:
CT HEAD WITHOUT CONTRAST
TECHNIQUE: Contiguous axial images were obtained from the base of the skull
through the vertex without intravenous contrast.

[Series 3: head wo · axial · 0.42mm/px · z∈[+1156,+1262]mm · 7 of 29 slices shown, 9 images]
[im 4/29  brain]
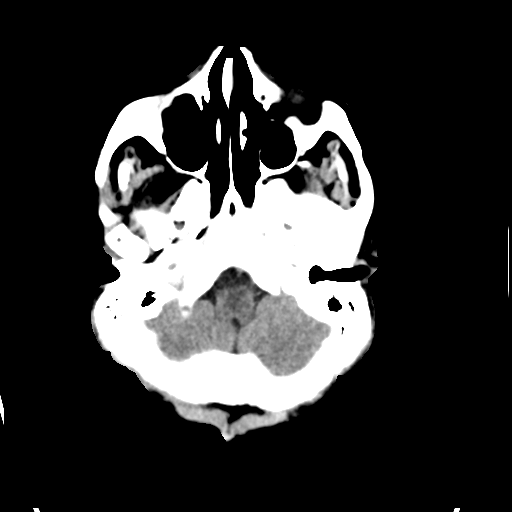
[im 4/29  bone]
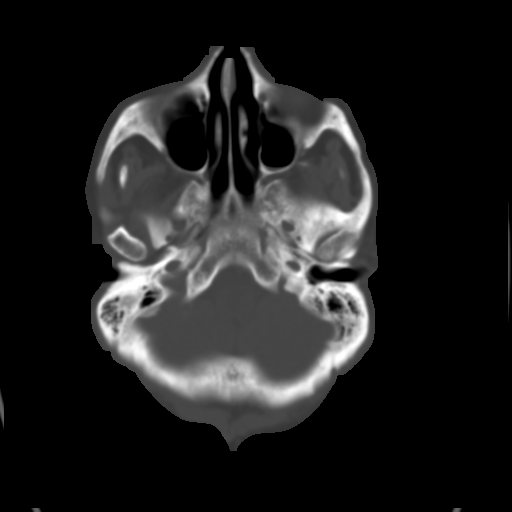
[im 8/29  brain]
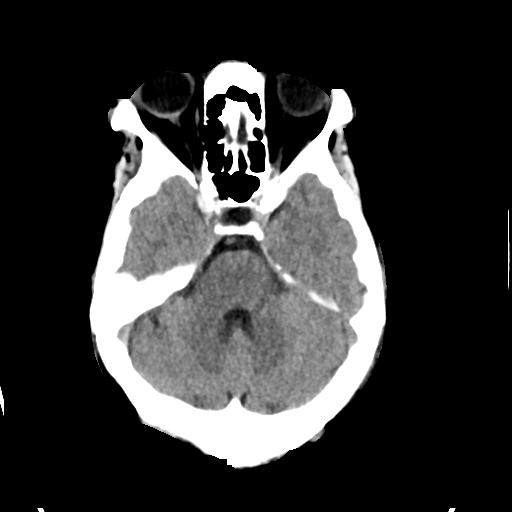
[im 11/29  brain]
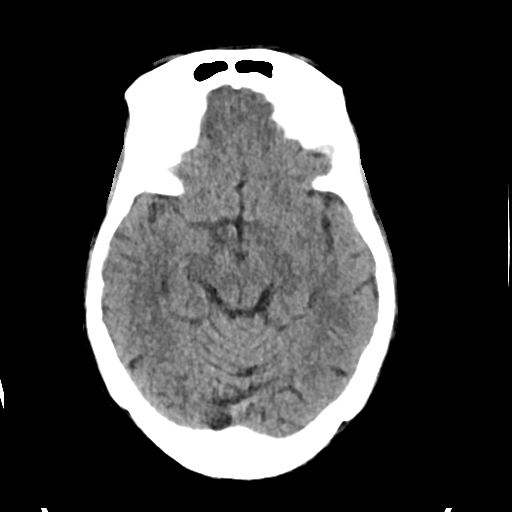
[im 15/29  brain]
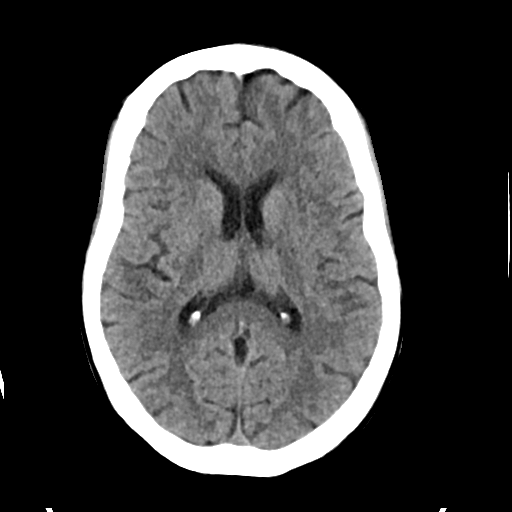
[im 18/29  brain]
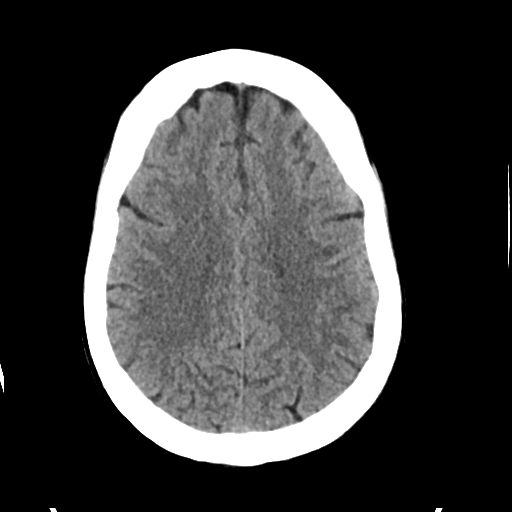
[im 18/29  bone]
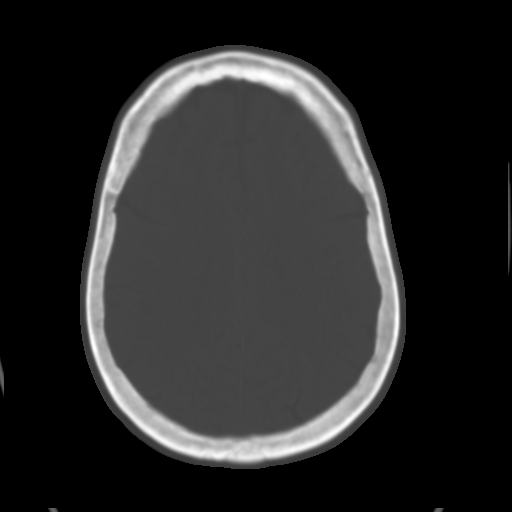
[im 22/29  brain]
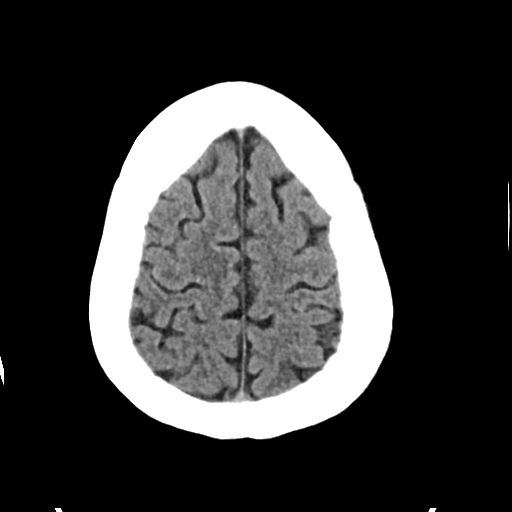
[im 25/29  brain]
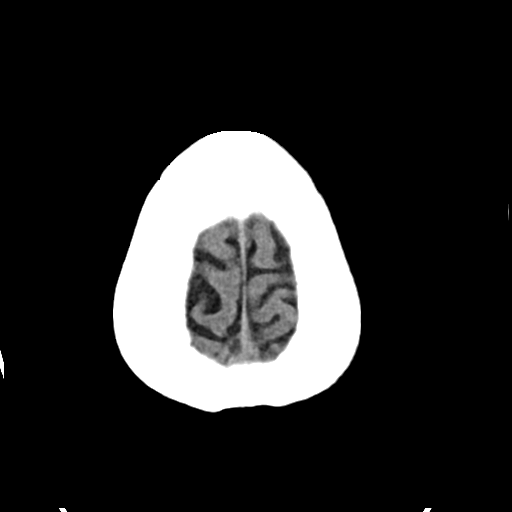

[Series 4: head bone · axial · 0.42mm/px · z∈[+1156,+1184]mm · 3 of 72 slices shown]
[im 8/72  bone]
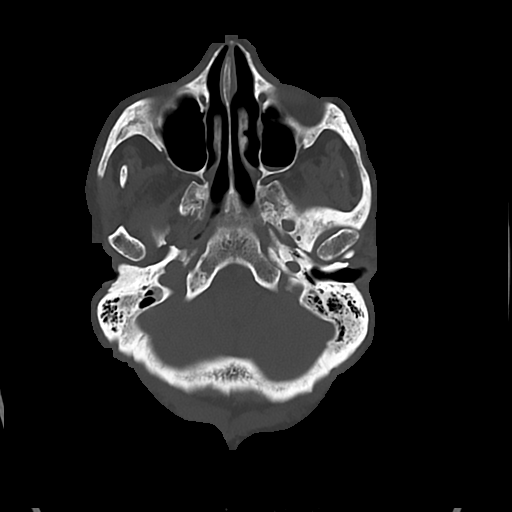
[im 15/72  bone]
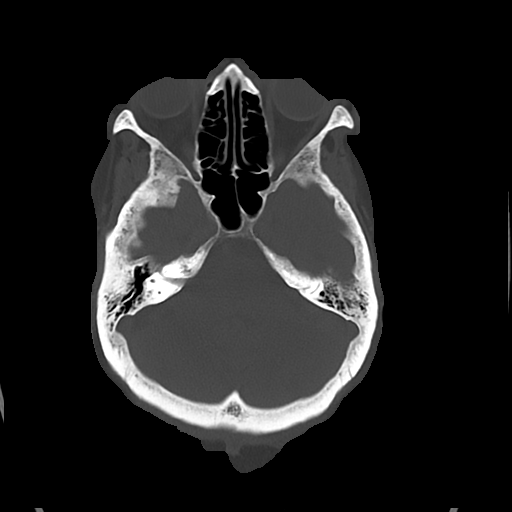
[im 22/72  bone]
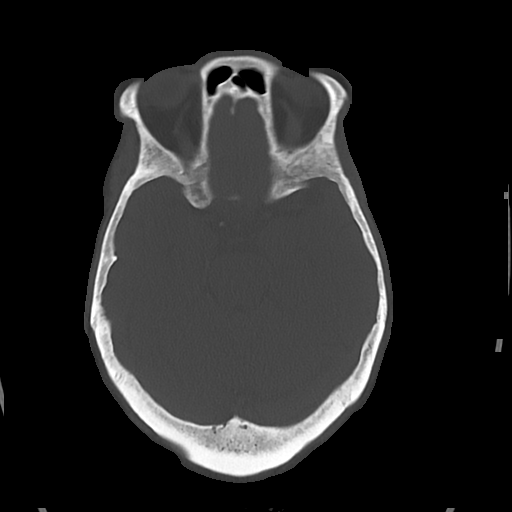

[Series 5: cor soft · coronal · 0.30mm/px · 3 of 66 slices shown]
[im 22/66  brain]
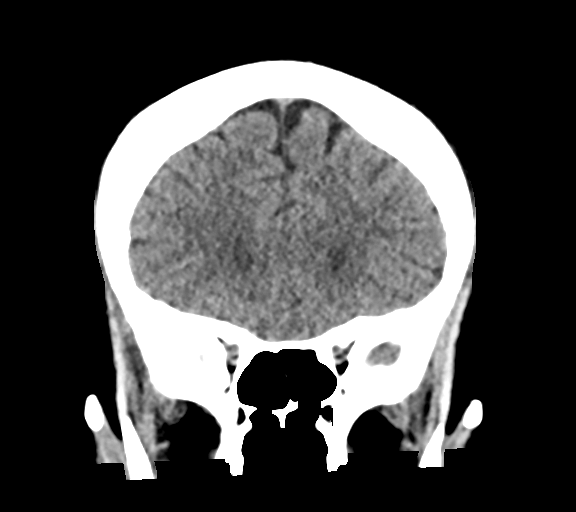
[im 29/66  brain]
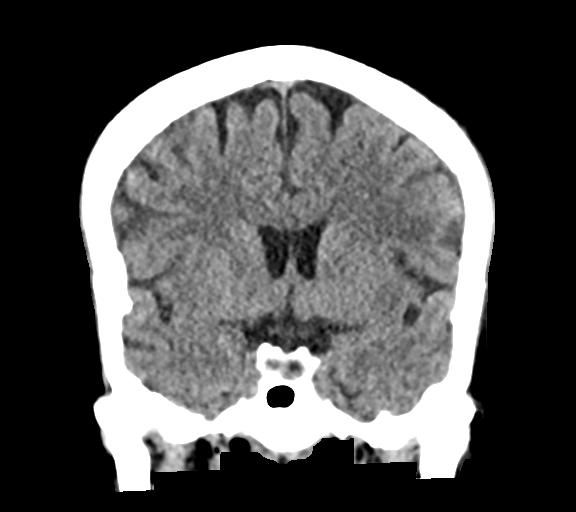
[im 37/66  brain]
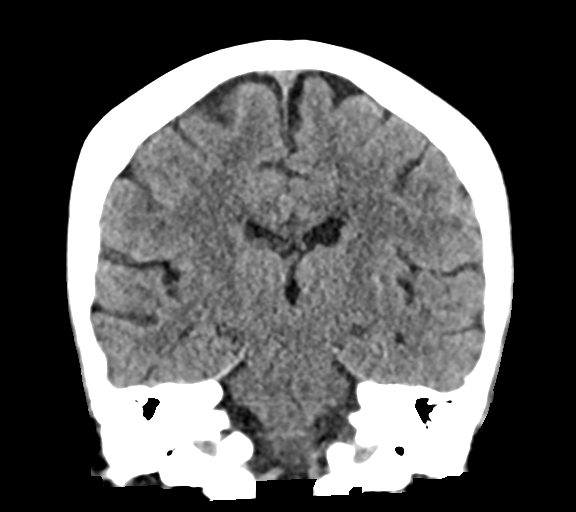

[Series 6: sag soft · sagittal · 0.30mm/px · 3 of 52 slices shown]
[im 18/52  brain]
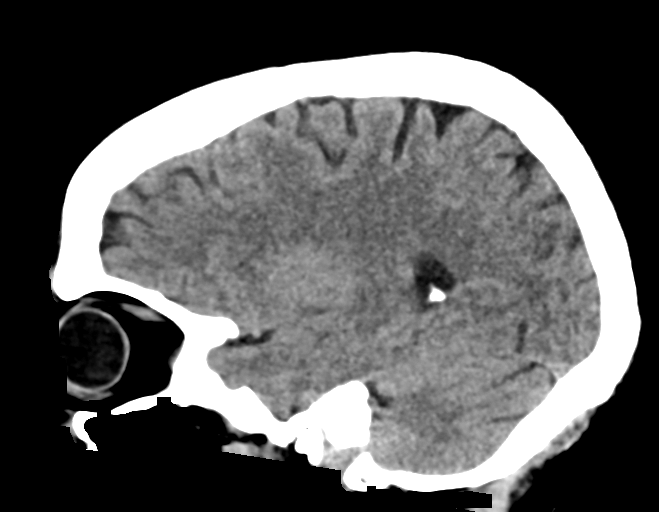
[im 26/52  brain]
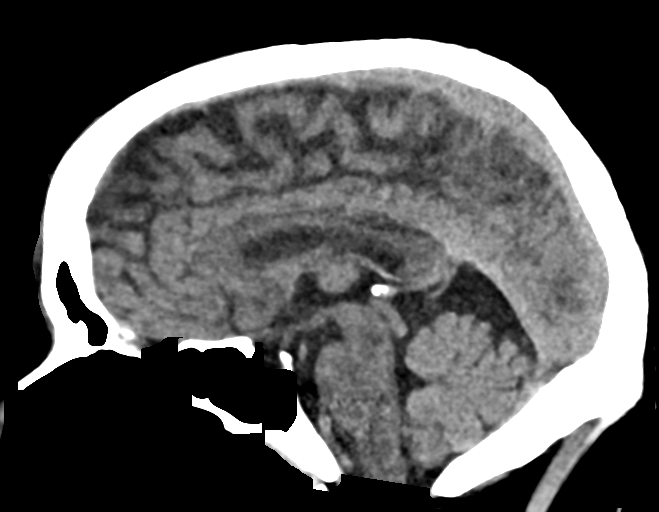
[im 35/52  brain]
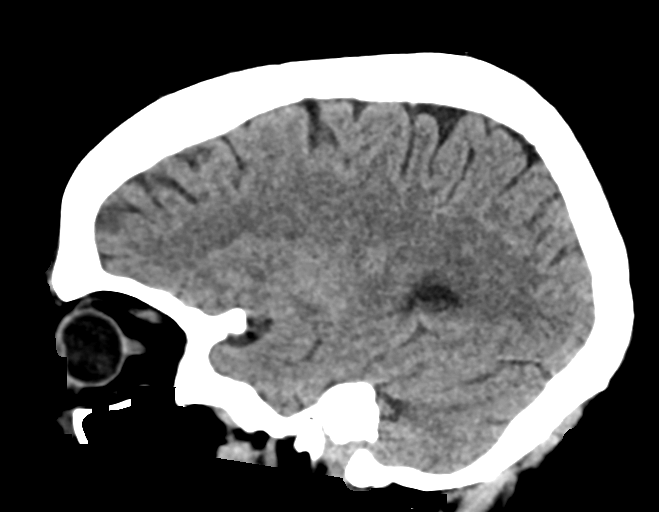

[16 of 47 positions shown; findings below may reference images not displayed]

FINDINGS: Brain: No evidence of acute large vascular territory infarction,
hemorrhage, hydrocephalus, extra-axial collection or mass
lesion/mass effect. Mild patchy white matter hypoattenuation, most
likely related chronic microvascular disease.

Vascular: No hyperdense vessel identified.

Skull: No acute fracture.

Sinuses/Orbits: Small amount of fluid layering in the left maxillary
sinus. Otherwise, clear visualized sinuses.

Other: No mastoid effusions.

ASPECTS ([place] Stroke Program Early CT Score) Total score (0-10
with 10 being normal): 10.
IMPRESSION: 1. No evidence of acute intracranial abnormality. ASPECTS is 10.
2. Mild chronic microvascular ischemic disease.

Code stroke imaging results were communicated on 03/12/2021 at [DATE] to provider Dr. Polden via secure text paging.

## 2022-02-13 IMAGING — MR MR HEAD W/O CM
9 of 10 series · 37 of 48 positions shown · non-contrast
Comparison: Noncontrast head CT and CT angiogram head/neck
03/12/2021. Brain MRI 06/29/2020.

CLINICAL DATA: Stroke, follow-up. Additional provided: Weakness,
facial droop.

EXAM:
MRI HEAD WITHOUT CONTRAST
TECHNIQUE: Multiplanar, multiecho pulse sequences of the brain and surrounding
structures were obtained without intravenous contrast.

[Series 4: DWI · axial · 3.0mm · 1.09mm/px · z∈[-90,+44]mm · 11 of 94 slices shown (1 of 4)]
[im 1/94]
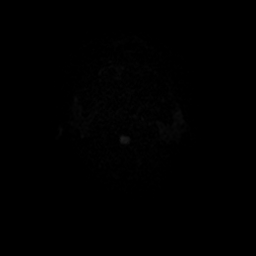
[im 10/94]
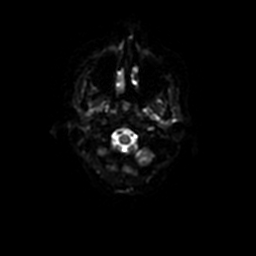
[im 19/94]
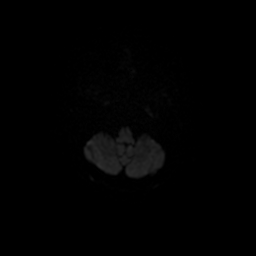
[im 28/94]
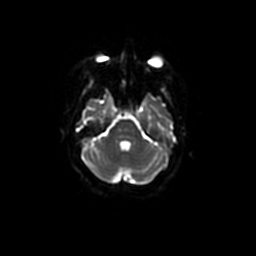
[im 38/94]
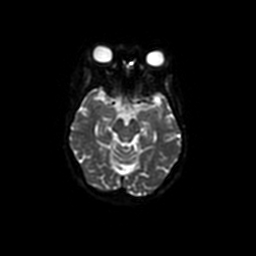
[im 47/94]
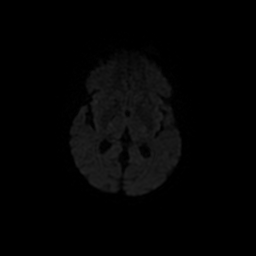
[im 56/94]
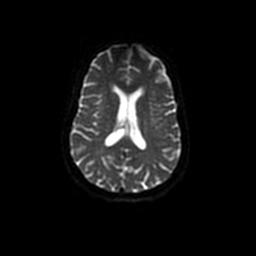
[im 66/94]
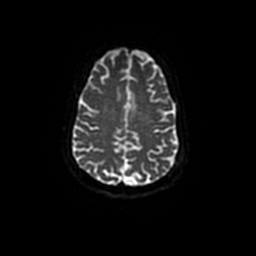
[im 75/94]
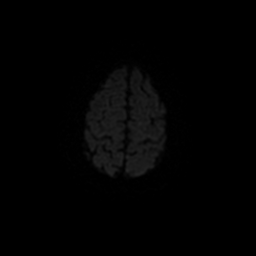
[im 84/94]
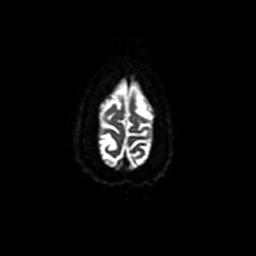
[im 94/94]
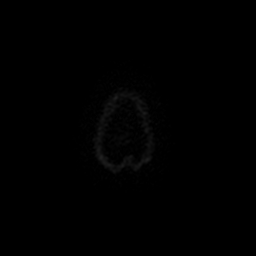

[Series 7: DWI · coronal · 5.0mm · 1.09mm/px · 8 of 72 slices shown (2 of 4)]
[im 1/72]
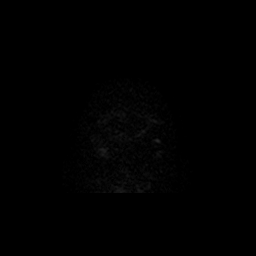
[im 11/72]
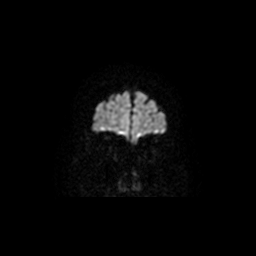
[im 21/72]
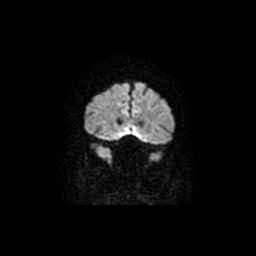
[im 31/72]
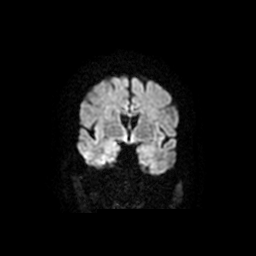
[im 41/72]
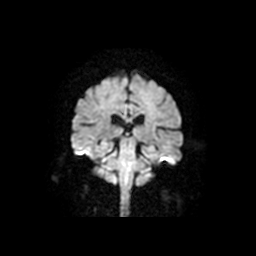
[im 51/72]
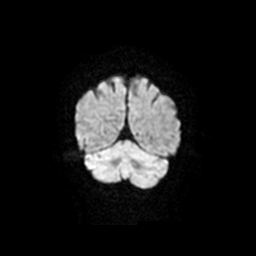
[im 61/72]
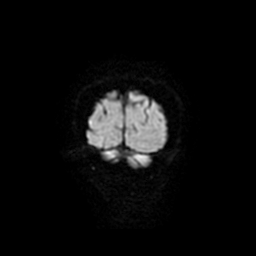
[im 72/72]
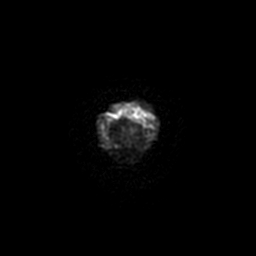

[Series 8: T1 · sagittal · 5.0mm · 0.49mm/px · 2 of 23 slices shown (1 of 2)]
[im 1/23]
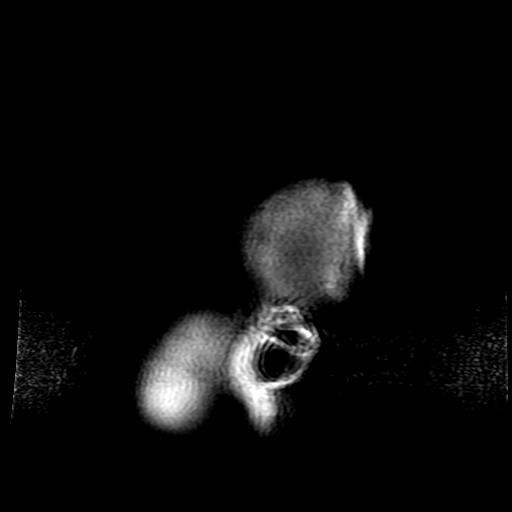
[im 23/23]
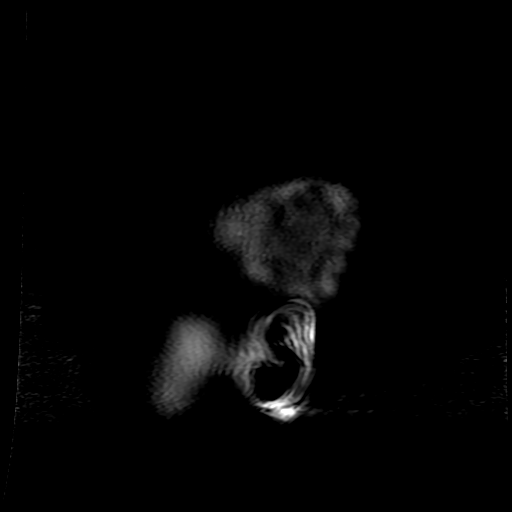

[Series 9: FLAIR · axial · 5.0mm · 0.43mm/px · z∈[-83,+51]mm · 2 of 24 slices shown]
[im 1/24]
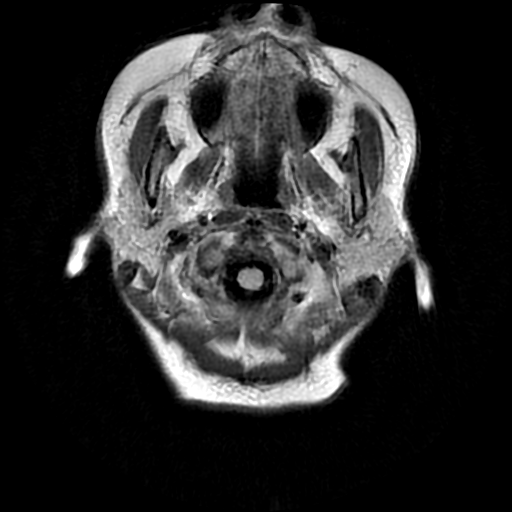
[im 24/24]
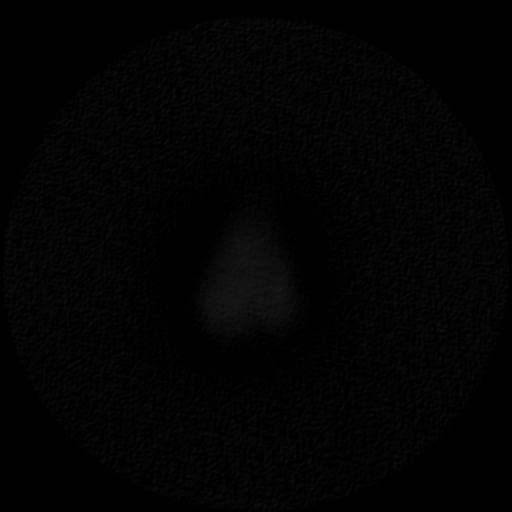

[Series 11: T1 · axial · 3.0mm · 0.47mm/px · 1 of 96 slices shown (2 of 2)]
[im 1/96]
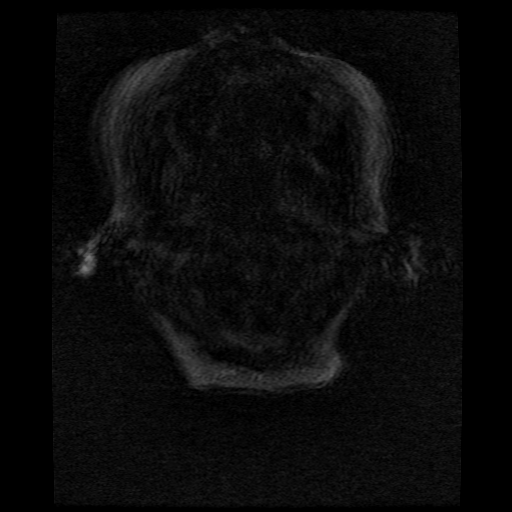

[Series 12: T2 · axial · 5.0mm · 0.43mm/px · z∈[-83,+51]mm · 2 of 24 slices shown (1 of 2)]
[im 1/24]
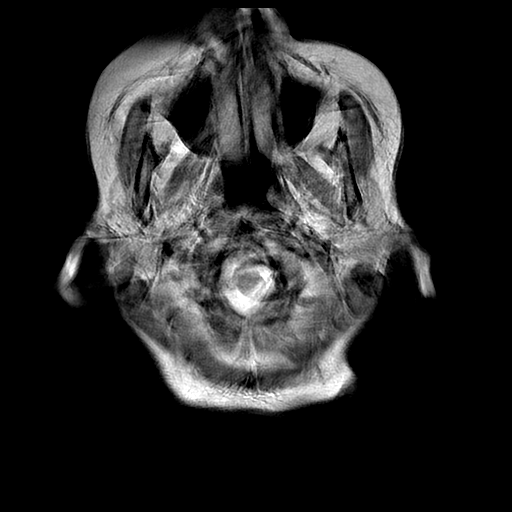
[im 24/24]
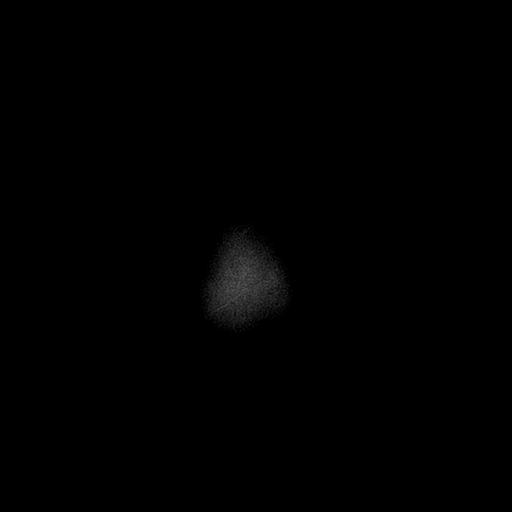

[Series 13: T2 · coronal · 5.0mm · 0.43mm/px · 3 of 30 slices shown (2 of 2)]
[im 1/30]
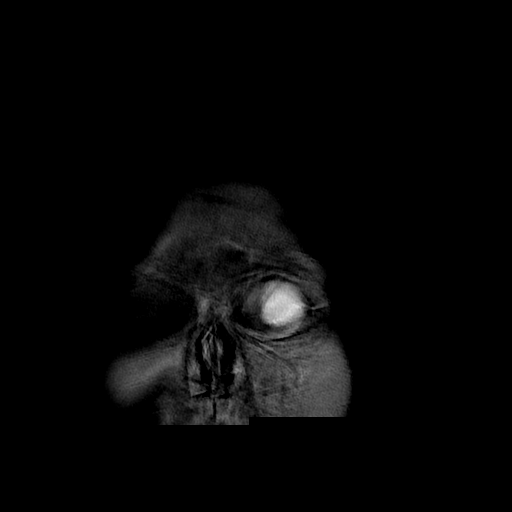
[im 15/30]
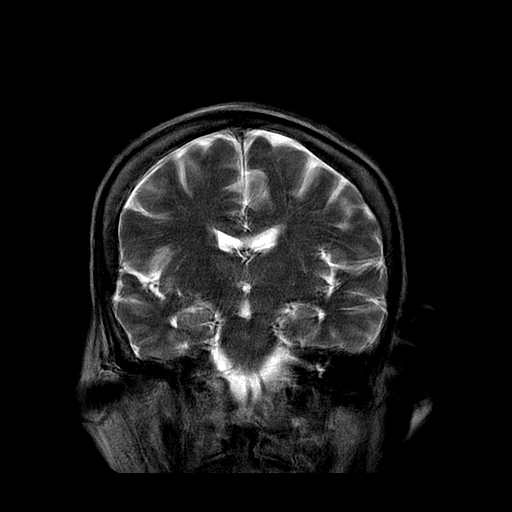
[im 30/30]
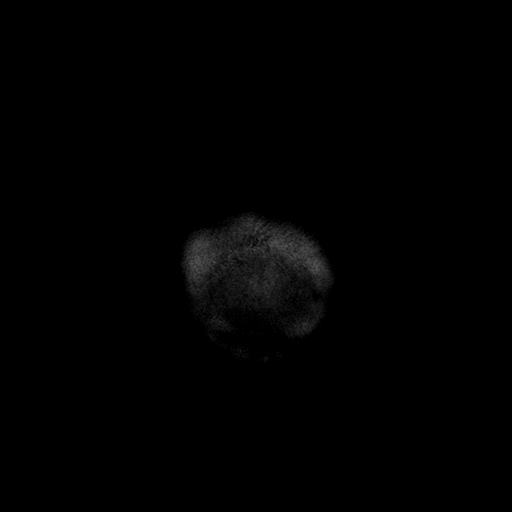

[Series 400: DWI · axial · 3.0mm · 1.09mm/px · z∈[-90,+44]mm · 5 of 47 slices shown (3 of 4)]
[im 1/47]
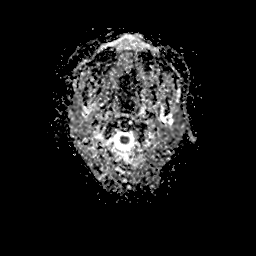
[im 12/47]
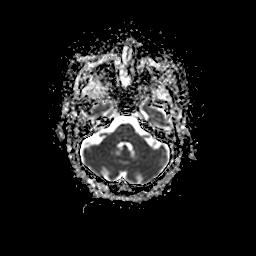
[im 24/47]
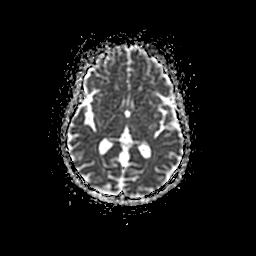
[im 35/47]
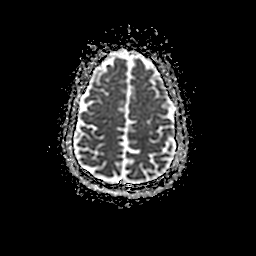
[im 47/47]
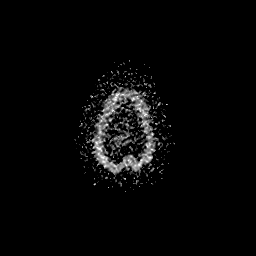

[Series 700: DWI · coronal · 5.0mm · 1.09mm/px · 3 of 32 slices shown (4 of 4)]
[im 1/32]
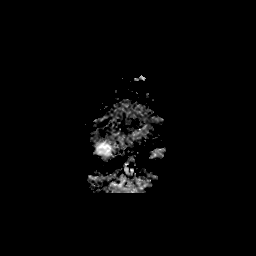
[im 16/32]
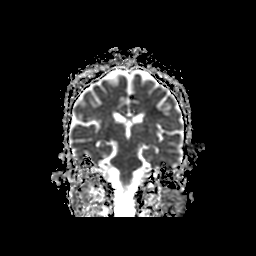
[im 32/32]
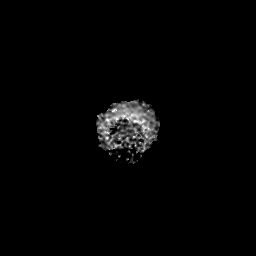

[37 of 48 positions shown; findings below may reference images not displayed]

FINDINGS: Brain:

The examination is intermittently motion degraded, limiting
evaluation. Most notably, there is moderate motion degradation of
the sagittal T1 weighted sequence, moderate motion degradation of
the axial T2 FLAIR sequence, severe motion degradation of the axial
T1 weighted sequence, moderate/severe motion degradation of the
axial T2 sequence and moderate/severe motion degradation of the
coronal T2 sequence.

Cerebral volume is normal for age.

Mild multifocal T2/FLAIR hyperintensity within the cerebral white
matter and pons, nonspecific but compatible with chronic small
vessel ischemic disease.

There is no acute infarct.

No evidence of intracranial mass.

No chronic intracranial blood products.

No extra-axial fluid collection.

No midline shift.

Partially empty sella turcica.

Vascular: Expected proximal arterial flow voids.

Skull and upper cervical spine: No focal marrow lesion is
identified.

Sinuses/Orbits: Visualized orbits show no acute finding. No
significant paranasal sinus disease at the imaged levels.
IMPRESSION: Motion degraded examination, as described and limiting evaluation.

The diffusion-weighted imaging is of good quality. No evidence of
acute infarction.

No acute intracranial abnormality is identified.

Mild chronic small vessel ischemic changes within the cerebral white
matter and pons, stable as compared to the brain MRI of 06/29/2020.

## 2022-02-18 IMAGING — RF DG ESOPHAGUS
15 of 16 series · 23 of 24 positions shown · non-contrast
Comparison: CT evaluation from Thursday July, 2020

CLINICAL DATA: Chronic swallowing dysfunction of solids over 4
years.

EXAM:
ESOPHOGRAM/BARIUM SWALLOW
TECHNIQUE: Single contrast examination was performed using  thin barium.
FLUOROSCOPY TIME:  Fluoroscopy Time:  2 minutes 48 seconds
Radiation Exposure Index (if provided by the fluoroscopic device):
11.1 mGy
Number of Acquired Spot Images: 0

[Series 1: cp_standard · 0.35mm/px · 2 of 27 frames shown (1 of 15)]
[frame 5/27]
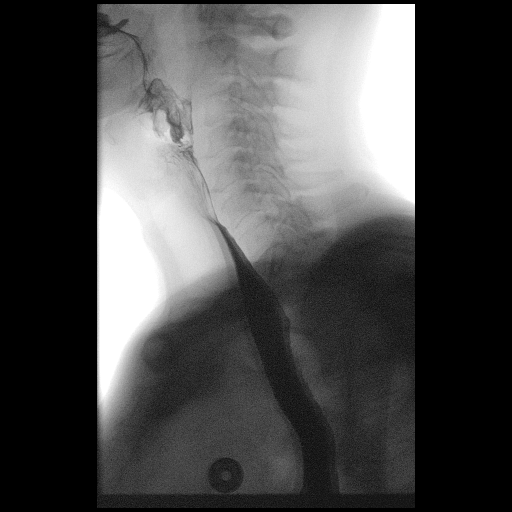
[frame 23/27]
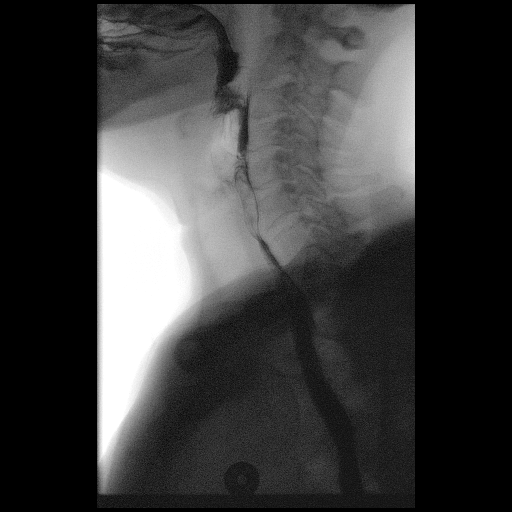

[Series 2: cp_standard · 0.35mm/px · 1 of 145 frames shown (2 of 15)]
[frame 22/145]
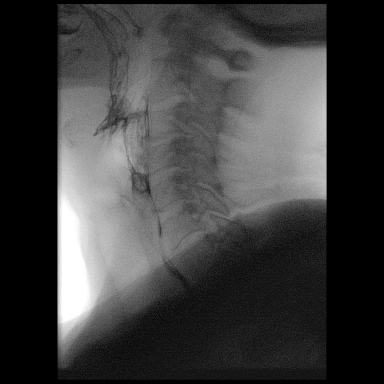

[Series 3: cp_standard · 0.37mm/px · 2 of 53 frames shown (3 of 15)]
[frame 8/53]
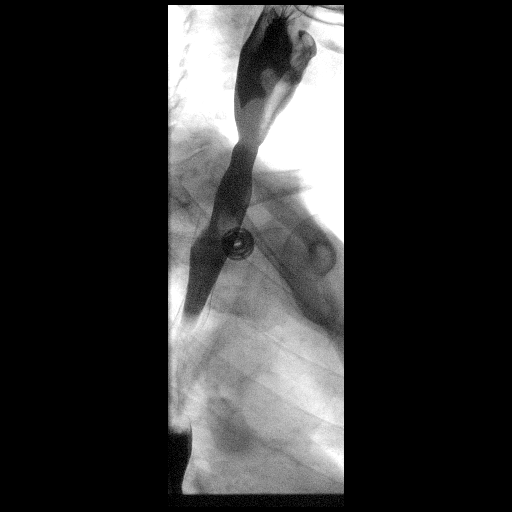
[frame 46/53]
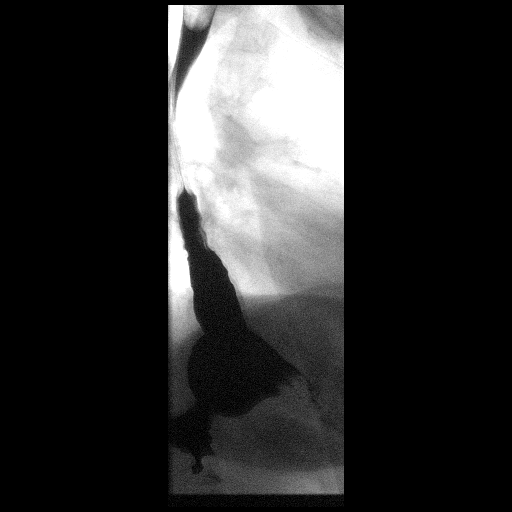

[Series 4: cp_standard · 0.38mm/px · 1 of 33 frames shown (4 of 15)]
[frame 17/33]
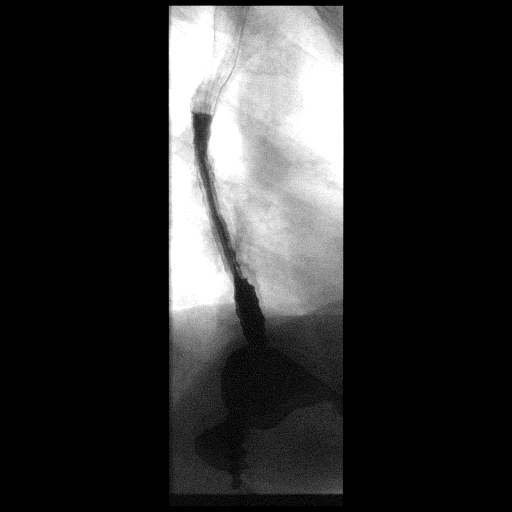

[Series 5: cp_standard · 0.36mm/px · 2 of 56 frames shown (5 of 15)]
[frame 1/56]
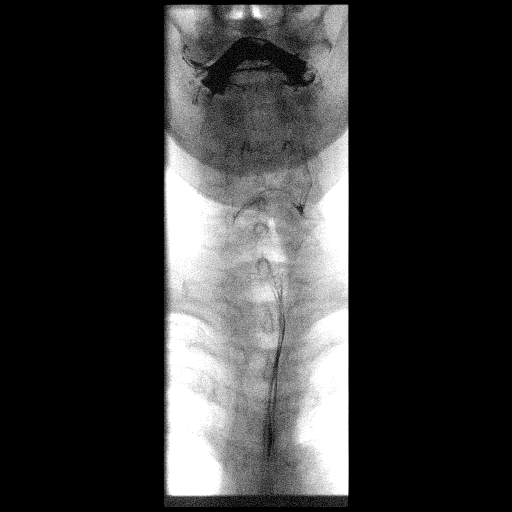
[frame 48/56]
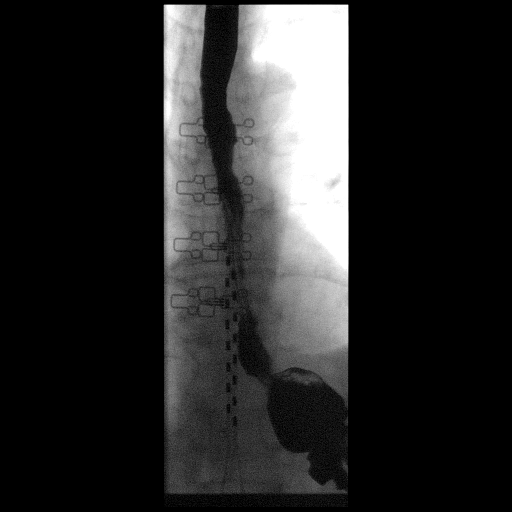

[Series 6: cp_standard · 0.42mm/px · 1 of 34 frames shown (6 of 15)]
[frame 29/34]
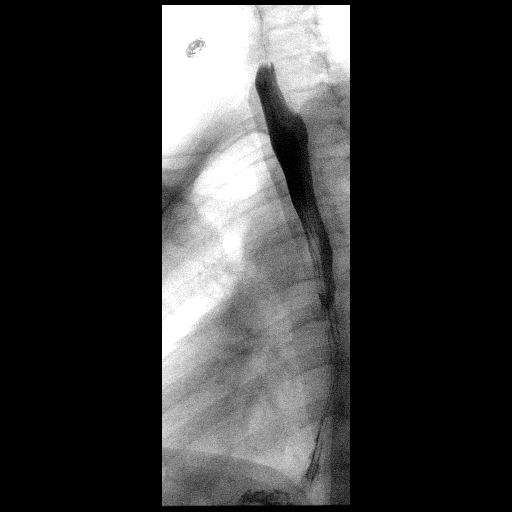

[Series 7: cp_standard · 0.43mm/px · 2 of 9 frames shown (7 of 15)]
[frame 2/9]
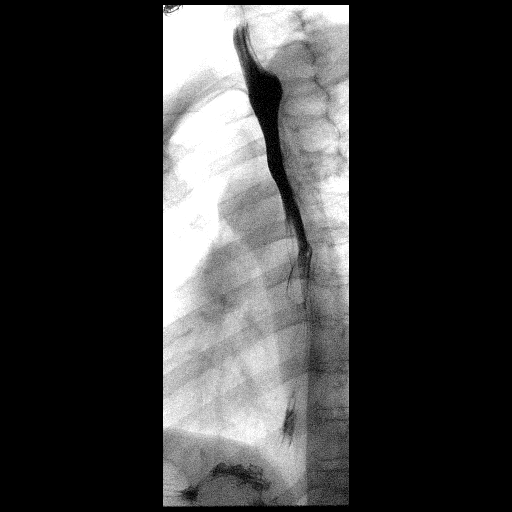
[frame 8/9]
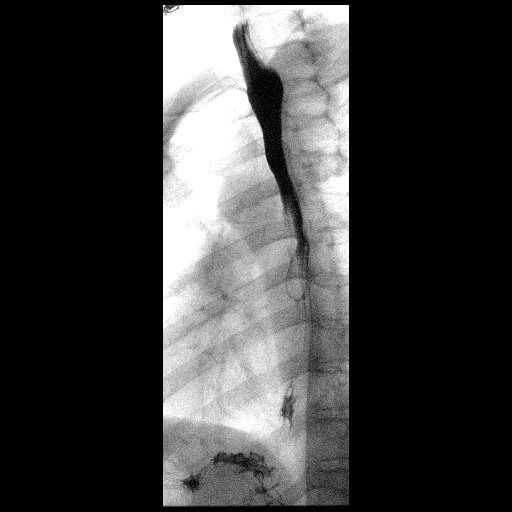

[Series 9: cp_standard · 0.43mm/px · 1 of 14 frames shown (8 of 15)]
[frame 3/14]
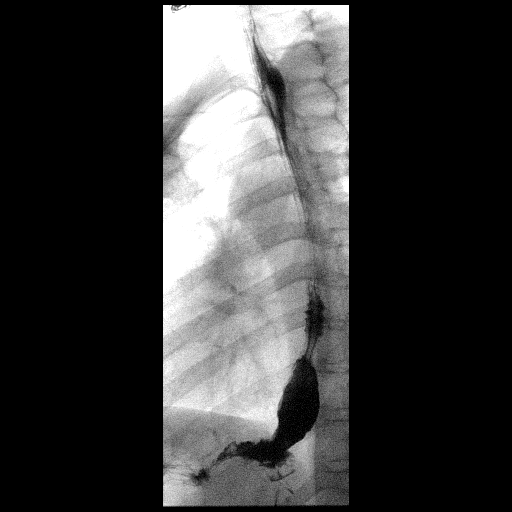

[Series 10: cp_standard · 0.44mm/px · 2 of 15 frames shown (9 of 15)]
[frame 2/15]
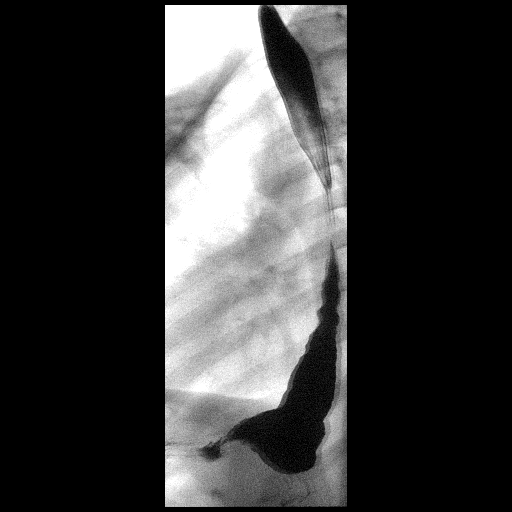
[frame 13/15]
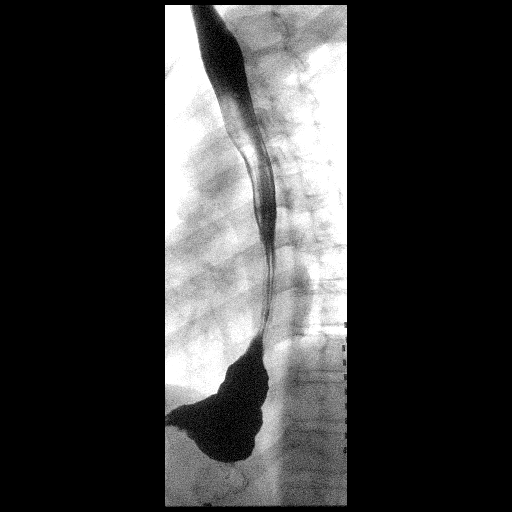

[Series 11: cp_standard · 0.44mm/px · 1 of 27 frames shown (10 of 15)]
[frame 14/27]
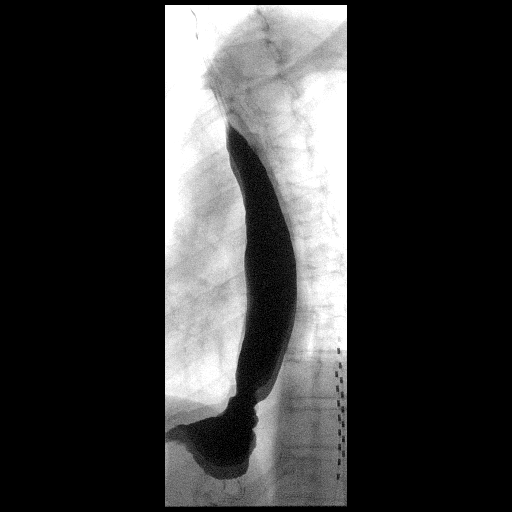

[Series 12: cp_standard · 0.43mm/px · 2 of 20 frames shown (11 of 15)]
[frame 4/20]
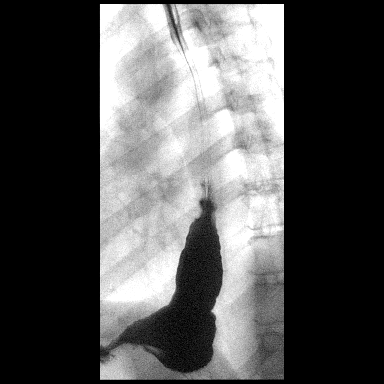
[frame 20/20]
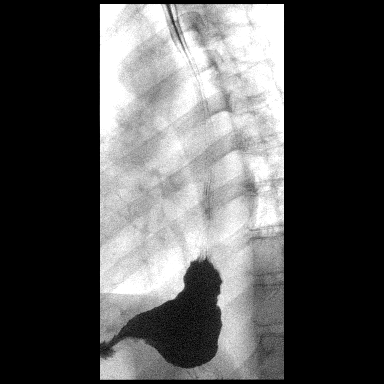

[Series 13: cp_standard · 0.43mm/px · 1 of 30 frames shown (12 of 15)]
[frame 16/30]
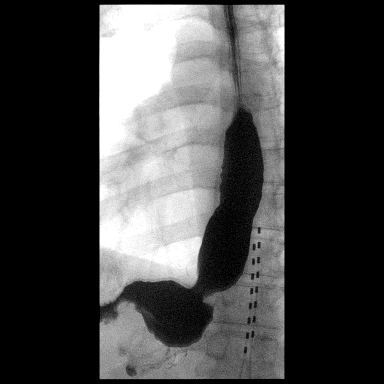

[Series 14: cp_standard · 0.43mm/px · 2 of 10 frames shown (13 of 15)]
[frame 2/10]
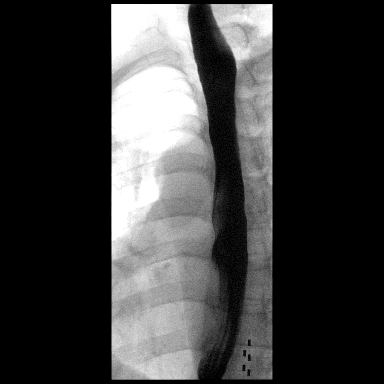
[frame 9/10]
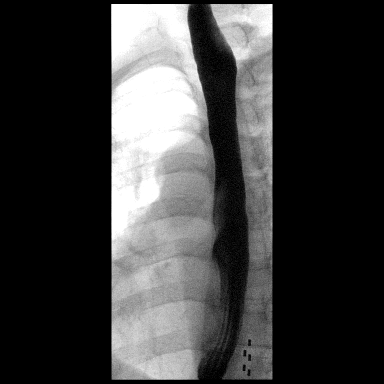

[Series 15: cp_standard · 0.43mm/px · 1 of 10 frames shown (14 of 15)]
[frame 6/10]
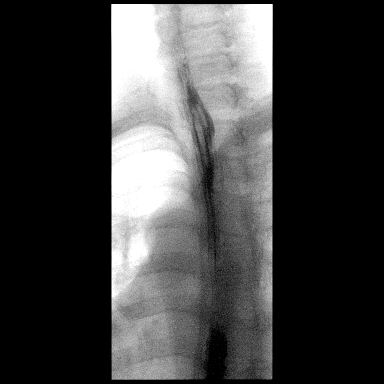

[Series 16: cp_standard · 0.40mm/px · 2 of 28 frames shown (15 of 15)]
[frame 5/28]
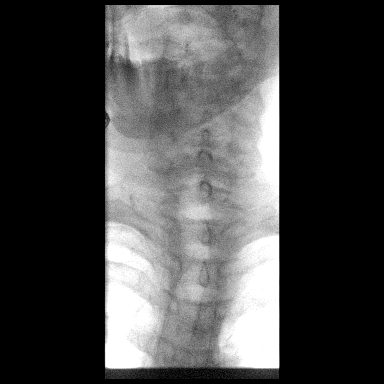
[frame 24/28]
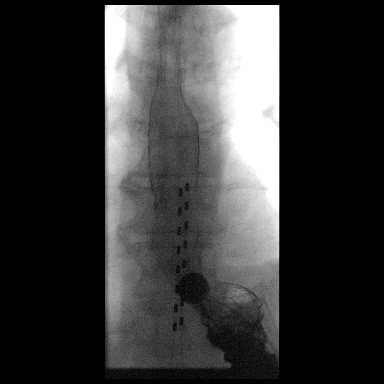

[23 of 24 positions shown; findings below may reference images not displayed]

FINDINGS: Thin barium administered in AP and lateral projections without signs
of swallow dysfunction grossly.

Thin barium administered in oblique projection without signs of
fixed narrowing of the esophagus. Signs of fold thickening are
noted.

Incidental note is made of changes of gastric bypass which are
incompletely assessed on the current exam.

Small hiatal hernia and patulous appearance of distal esophagus
without signs of stricture.

Single swallow assessment shows intact primary wave but with
proximal escape of much of the ingested bolus. Esophageal
dysmotility and tertiary peristaltic activity noted.

Upon swallowing of barium tablet it passes easily into the gastric
pouch.

During single swallow assessment in prone RAO position there was
some reflux to the level of the proximal esophagus from the gastric
pouch.
IMPRESSION: 1. Esophageal dysmotility and tertiary peristaltic activity.
2. Small hiatal hernia and patulous appearance of distal esophagus.
3. No signs of fixed narrowing of the esophagus. Gastroesophageal
reflux.
4. Fold thickening related to esophagitis.

## 2022-02-26 IMAGING — CT CT HEAD CODE STROKE
3 of 4 series · 15 of 47 positions shown, 18 images · non-contrast
Comparison: Brain MRI 03/14/2021. Noncontrast head CT and CT
angiogram head/neck 03/12/2021.

CLINICAL DATA: Code stroke. Neuro deficit, acute, stroke suspected.
Additional history obtained from electronic medical record: Left arm
and leg weakness.

EXAM:
CT HEAD WITHOUT CONTRAST
TECHNIQUE: Contiguous axial images were obtained from the base of the skull
through the vertex without intravenous contrast.

[Series 4: head 2.0 h70h · axial · 0.45mm/px · z∈[+1253,+1379]mm · 9 of 79 slices shown, 12 images]
[im 8/79  brain]
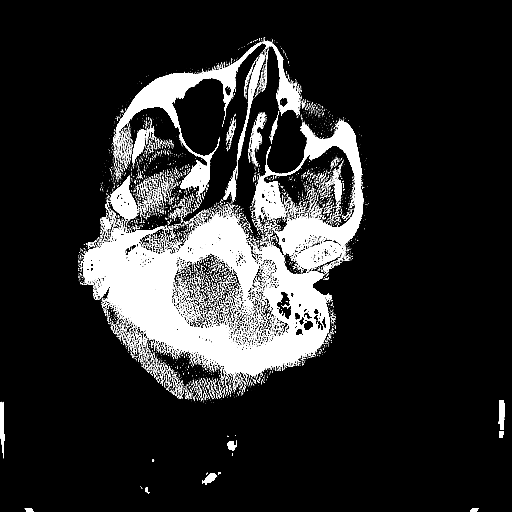
[im 8/79  bone]
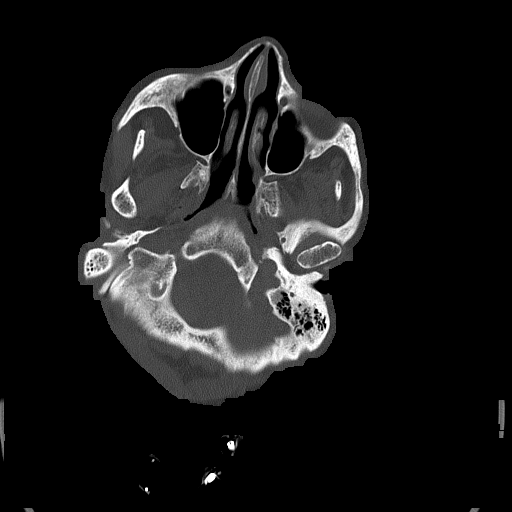
[im 16/79  brain]
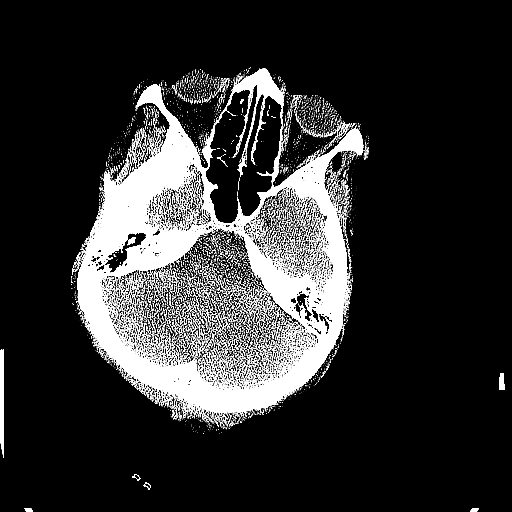
[im 24/79  brain]
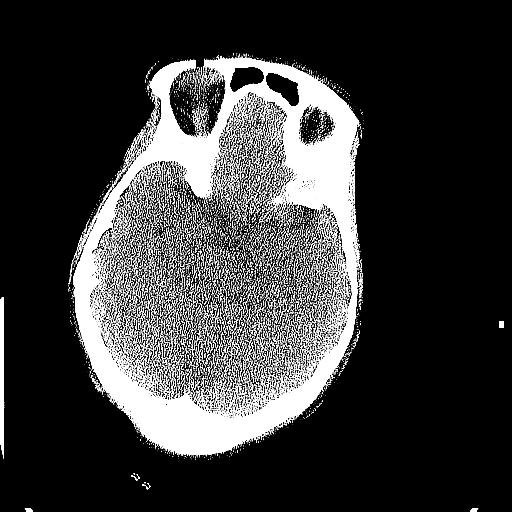
[im 32/79  brain]
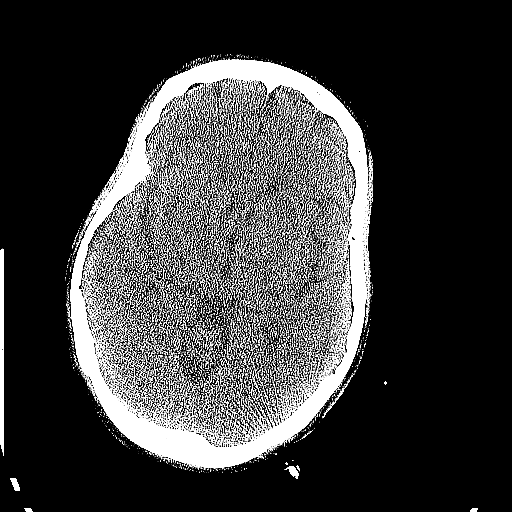
[im 40/79  brain]
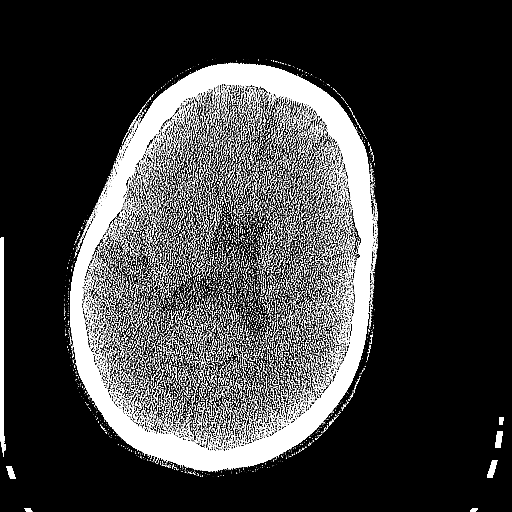
[im 40/79  bone]
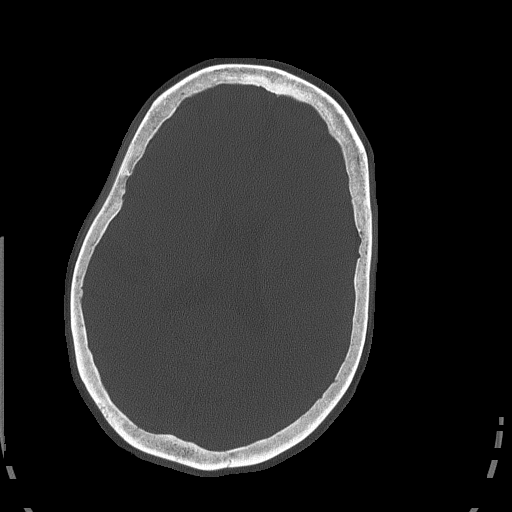
[im 47/79  brain]
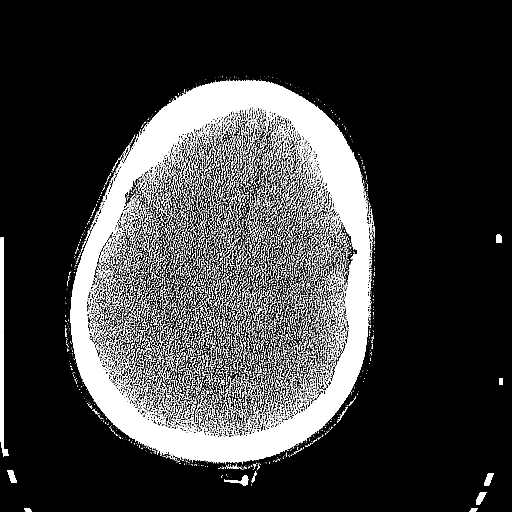
[im 55/79  brain]
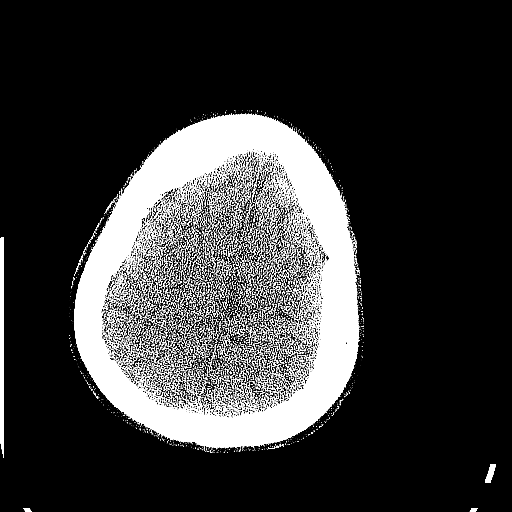
[im 63/79  brain]
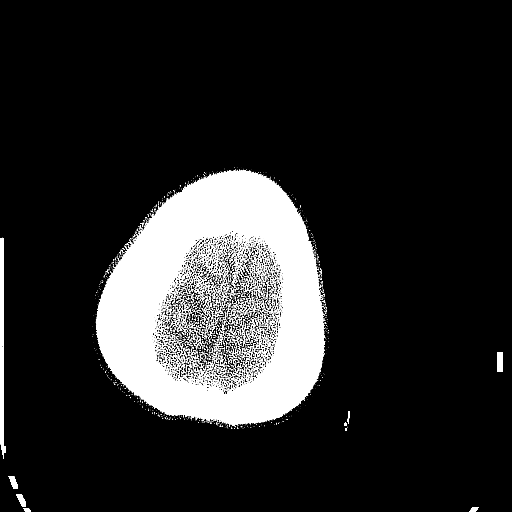
[im 71/79  brain]
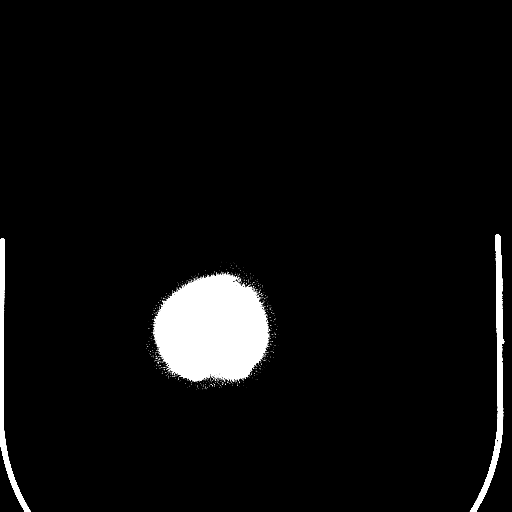
[im 71/79  bone]
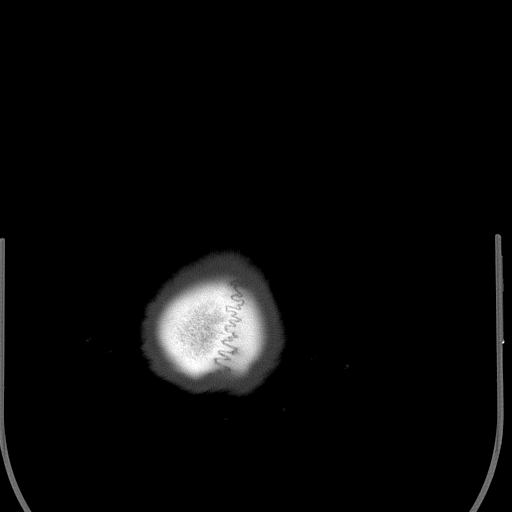

[Series 5: head 3.0 mpr cor · coronal · 0.34mm/px · 3 of 67 slices shown]
[im 23/67  brain]
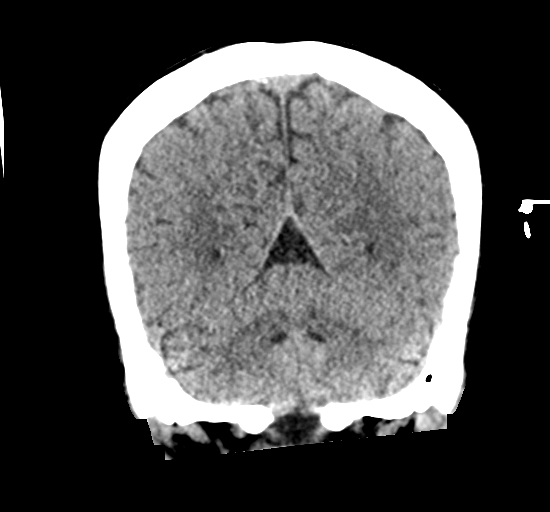
[im 30/67  brain]
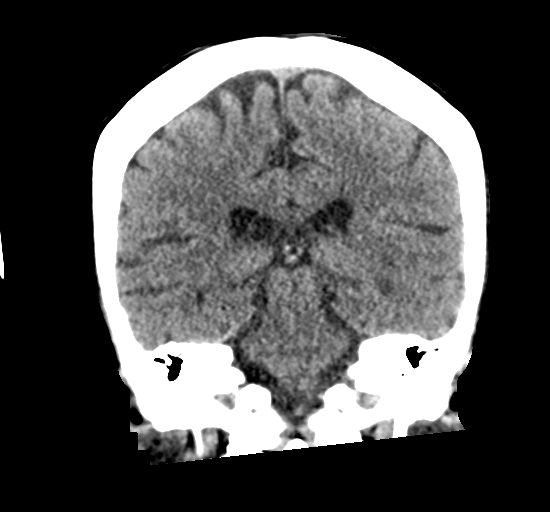
[im 37/67  brain]
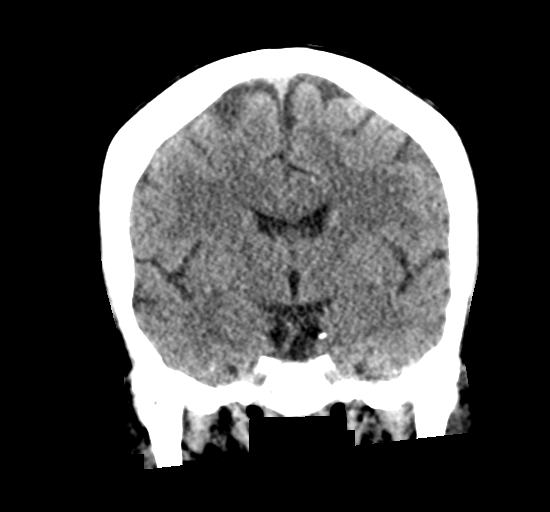

[Series 6: head 3.0 mpr sag · sagittal · 0.31mm/px · 3 of 66 slices shown]
[im 25/66  brain]
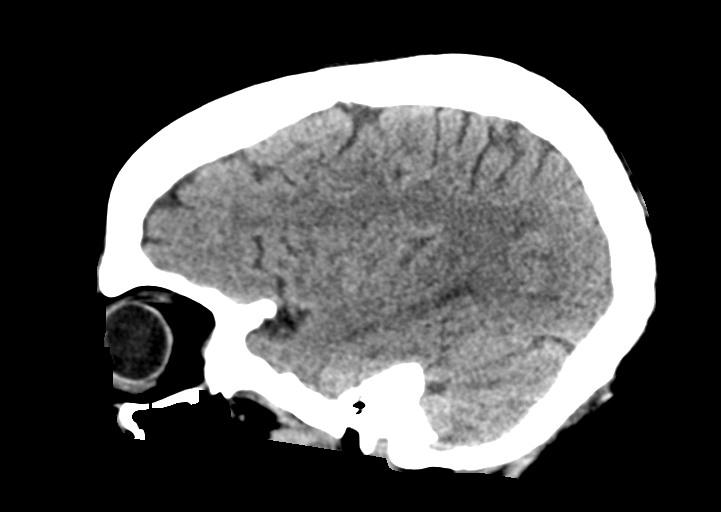
[im 34/66  brain]
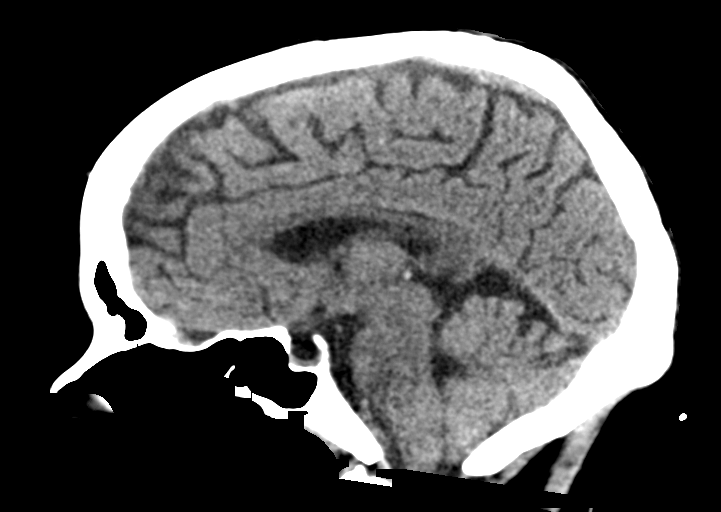
[im 42/66  brain]
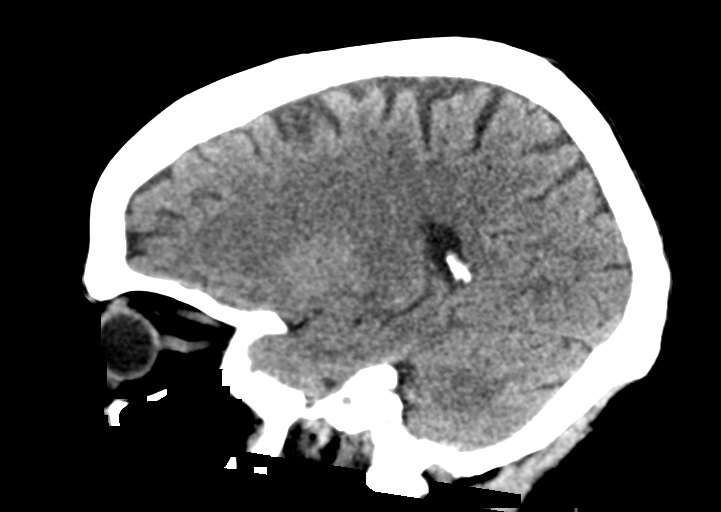

[15 of 47 positions shown; findings below may reference images not displayed]

FINDINGS: Brain:

Cerebral volume is normal for age.

There is no acute intracranial hemorrhage.

No demarcated cortical infarct.

No extra-axial fluid collection.

No evidence of an intracranial mass.

No midline shift.

Partially empty sella turcica.

Vascular: No hyperdense vessel.  Atherosclerotic calcifications.

Skull: Normal. Negative for fracture or focal lesion.

Sinuses/Orbits: Visualized orbits show no acute finding. No
significant paranasal sinus disease at the imaged levels.

ASPECTS (Alberta Stroke Program Early CT Score)

- Ganglionic level infarction (caudate, lentiform nuclei, internal
capsule, insula, M1-M3 cortex): 7

- Supraganglionic infarction (M4-M6 cortex): 3

Total score (0-10 with 10 being normal): 10

These results were communicated to Dr. Hermila At [DATE] pmon
03/27/2021by text page via the [employer] messaging system.
IMPRESSION: No evidence of acute intracranial abnormality.  ASPECTS is 10.

## 2022-03-17 IMAGING — CT CT ANGIO CHEST
2 of 7 series · 16 of 46 positions shown · IV contrast (APPLIED)
Comparison: 04/15/2021, 08/27/2020

CLINICAL DATA: Chest pain, abdominal distension

EXAM:
CT ANGIOGRAPHY CHEST
CT ABDOMEN AND PELVIS WITH CONTRAST
TECHNIQUE: Multidetector CT imaging of the chest was performed using the
standard protocol during bolus administration of intravenous
contrast. Multiplanar CT image reconstructions and MIPs were
obtained to evaluate the vascular anatomy. Multidetector CT imaging
of the abdomen and pelvis was performed using the standard protocol
during bolus administration of intravenous contrast.
CONTRAST:  100mL OMNIPAQUE IOHEXOL 350 MG/ML SOLN

[Series 7: thins · axial · 0.61mm/px · z∈[-644,-432]mm · 13 of 341 slices shown]
[im 19/341  lung]
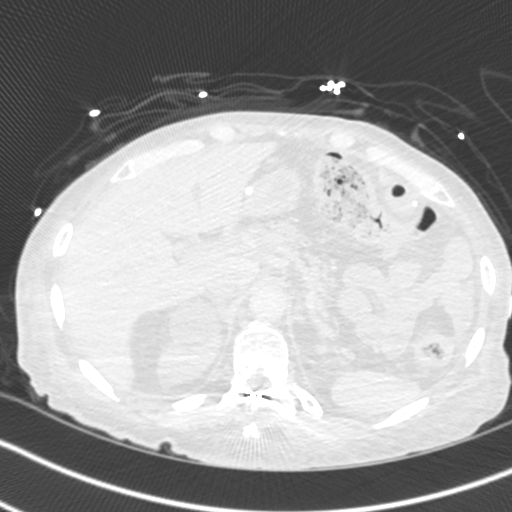
[im 38/341  soft-tissue]
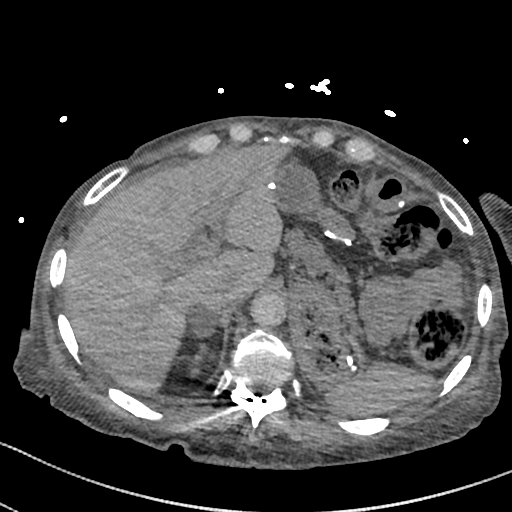
[im 76/341  lung]
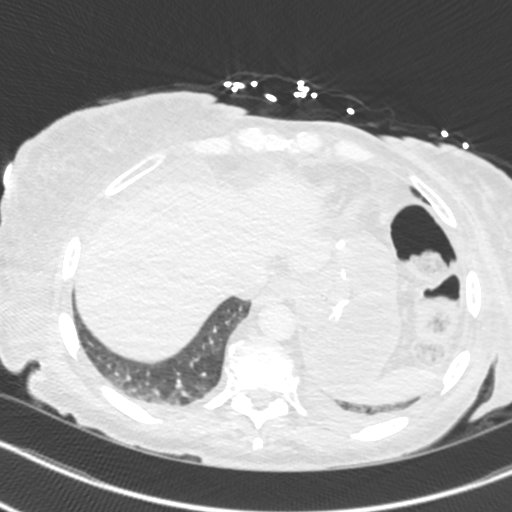
[im 95/341  soft-tissue]
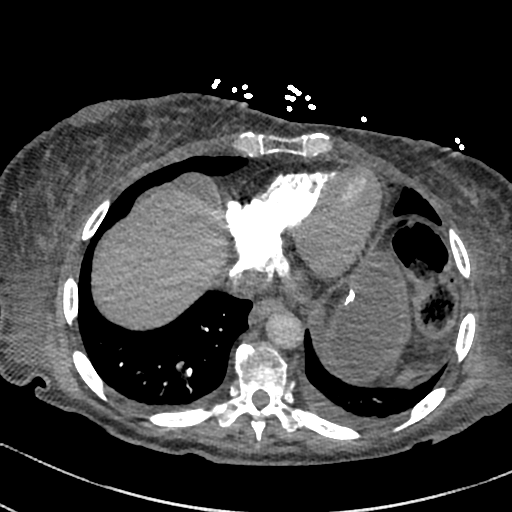
[im 114/341  lung]
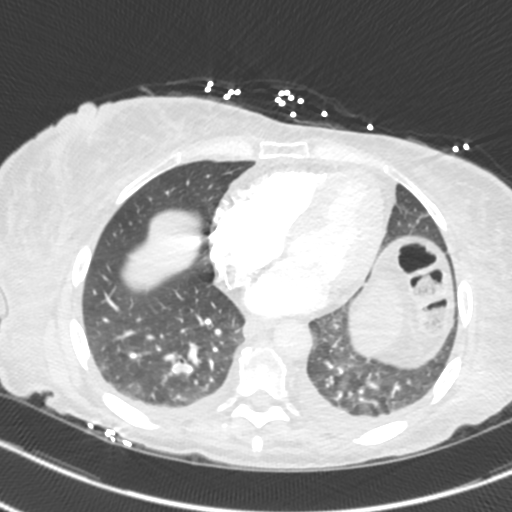
[im 152/341  soft-tissue]
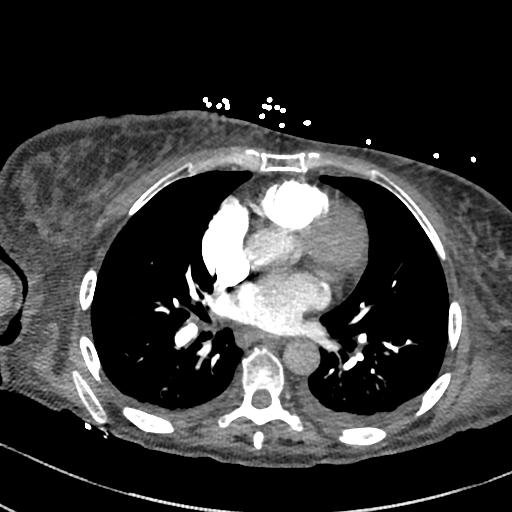
[im 171/341  lung]
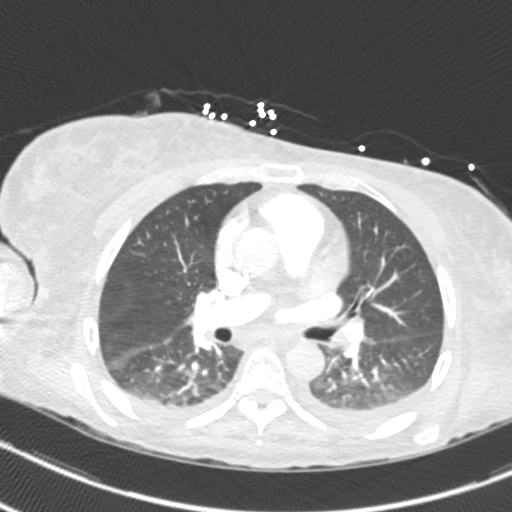
[im 189/341  soft-tissue]
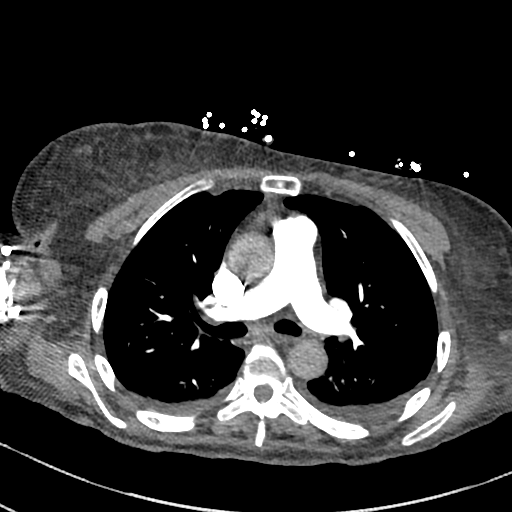
[im 227/341  lung]
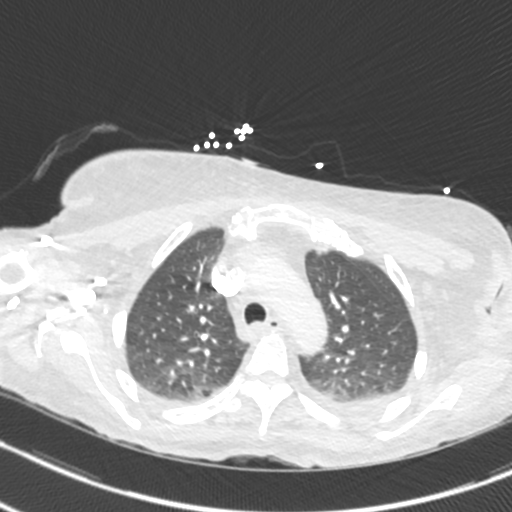
[im 246/341  soft-tissue]
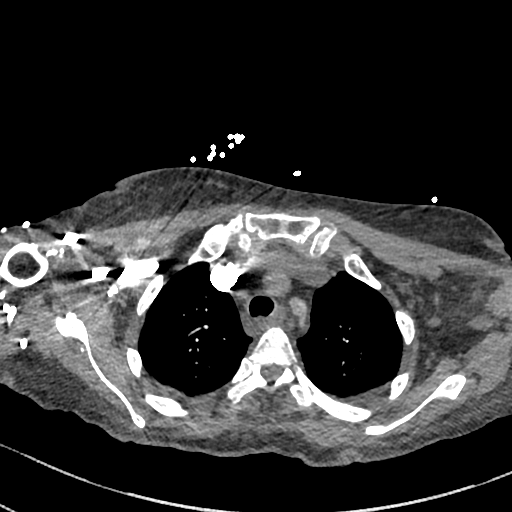
[im 265/341  lung]
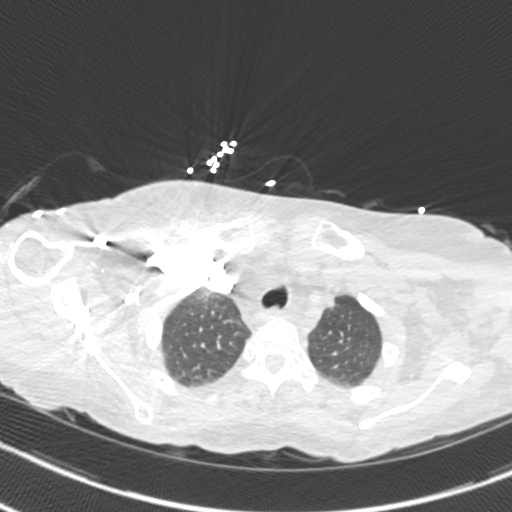
[im 303/341  soft-tissue]
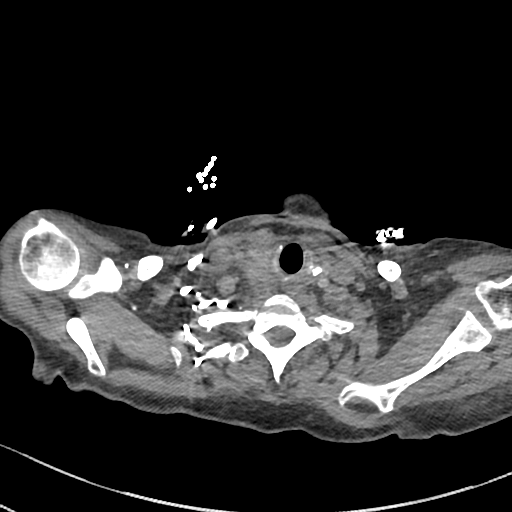
[im 322/341  lung]
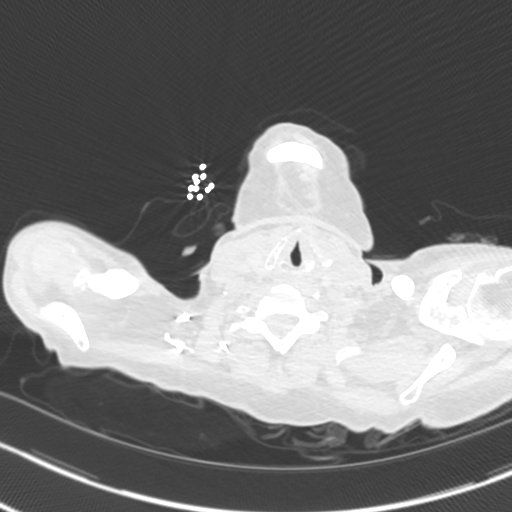

[Series 9: cor · coronal · 0.54mm/px · 3 of 107 slices shown]
[im 27/107  soft-tissue]
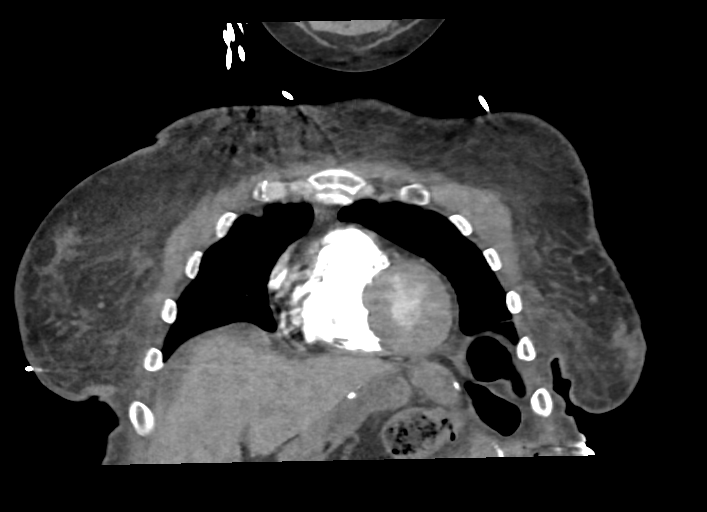
[im 54/107  soft-tissue]
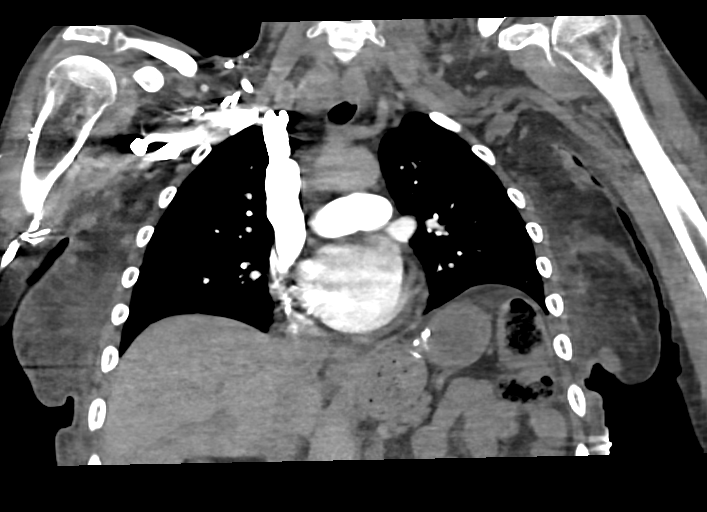
[im 80/107  soft-tissue]
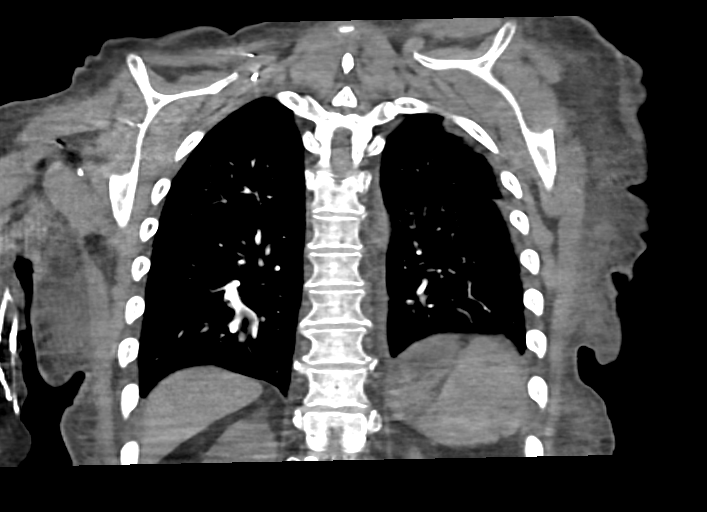

[16 of 46 positions shown; findings below may reference images not displayed]

FINDINGS: CTA CHEST FINDINGS

Cardiovascular: This is a technically adequate evaluation of the
pulmonary vasculature. Acute segmental right lower lobe pulmonary
embolus within the posterior basilar segment. Clot burden is
minimal. No other filling defects.

The heart is unremarkable without pericardial effusion. Normal
caliber of the thoracic aorta.

Mediastinum/Nodes: Stable multinodular goiter, previously evaluated
in 2050. Trachea and esophagus are unremarkable. No pathologic
adenopathy.

Lungs/Pleura: There are small bilateral pleural effusions. No acute
airspace disease or pneumothorax. Dependent hypoventilatory changes
are seen within the lower lobes. The central airways are patent.

Musculoskeletal: No acute or destructive bony lesions. Diffuse chest
wall edema consistent with anasarca. Reconstructed images
demonstrate no additional findings.

Review of the MIP images confirms the above findings.

CT ABDOMEN and PELVIS FINDINGS

Hepatobiliary: Gallbladder is surgically absent. No focal liver
abnormalities. Mild prominence of the intrahepatic and extrahepatic
biliary system is likely related to prior cholecystectomy.

Pancreas: Unremarkable. No pancreatic ductal dilatation or
surrounding inflammatory changes.

Spleen: Normal in size without focal abnormality.

Adrenals/Urinary Tract: Bilateral renal cysts are again noted. No
urinary tract calculi or obstructive uropathy. The adrenals and
bladder are grossly normal.

Stomach/Bowel: No bowel obstruction or ileus. Normal appendix right
lower quadrant. Postsurgical changes from previous bariatric
surgery. No bowel wall thickening or inflammatory change.

Vascular/Lymphatic: No significant vascular findings are present. No
enlarged abdominal or pelvic lymph nodes.

Reproductive: Status post hysterectomy. No adnexal masses.

Other: Diffuse body wall edema consistent with anasarca. No free
intraperitoneal fluid or free gas. No abdominal wall hernia.

Musculoskeletal: No acute or destructive bony lesions. Postsurgical
changes are seen within the lower lumbar spine. Spinal stimulator
again noted, leads within the central thoracic canal.

Review of the MIP images confirms the above findings.
IMPRESSION: 1. Right lower lobe segmental pulmonary embolus. Minimal clot
burden. No evidence of right heart strain.
2. Small bilateral pleural effusions.
3. Diffuse body wall edema compatible with anasarca.

Critical Value/emergent results were called by telephone at the time
of interpretation on 04/15/2021 at [DATE] to provider DR LEMRANI, who
verbally acknowledged these results.

## 2022-03-17 IMAGING — DX DG CHEST 2V
2 series · 2 of 2 positions shown · non-contrast
Comparison: 08/27/2020

CLINICAL DATA: Chest pain

EXAM:
CHEST - 2 VIEW

[chest lat]
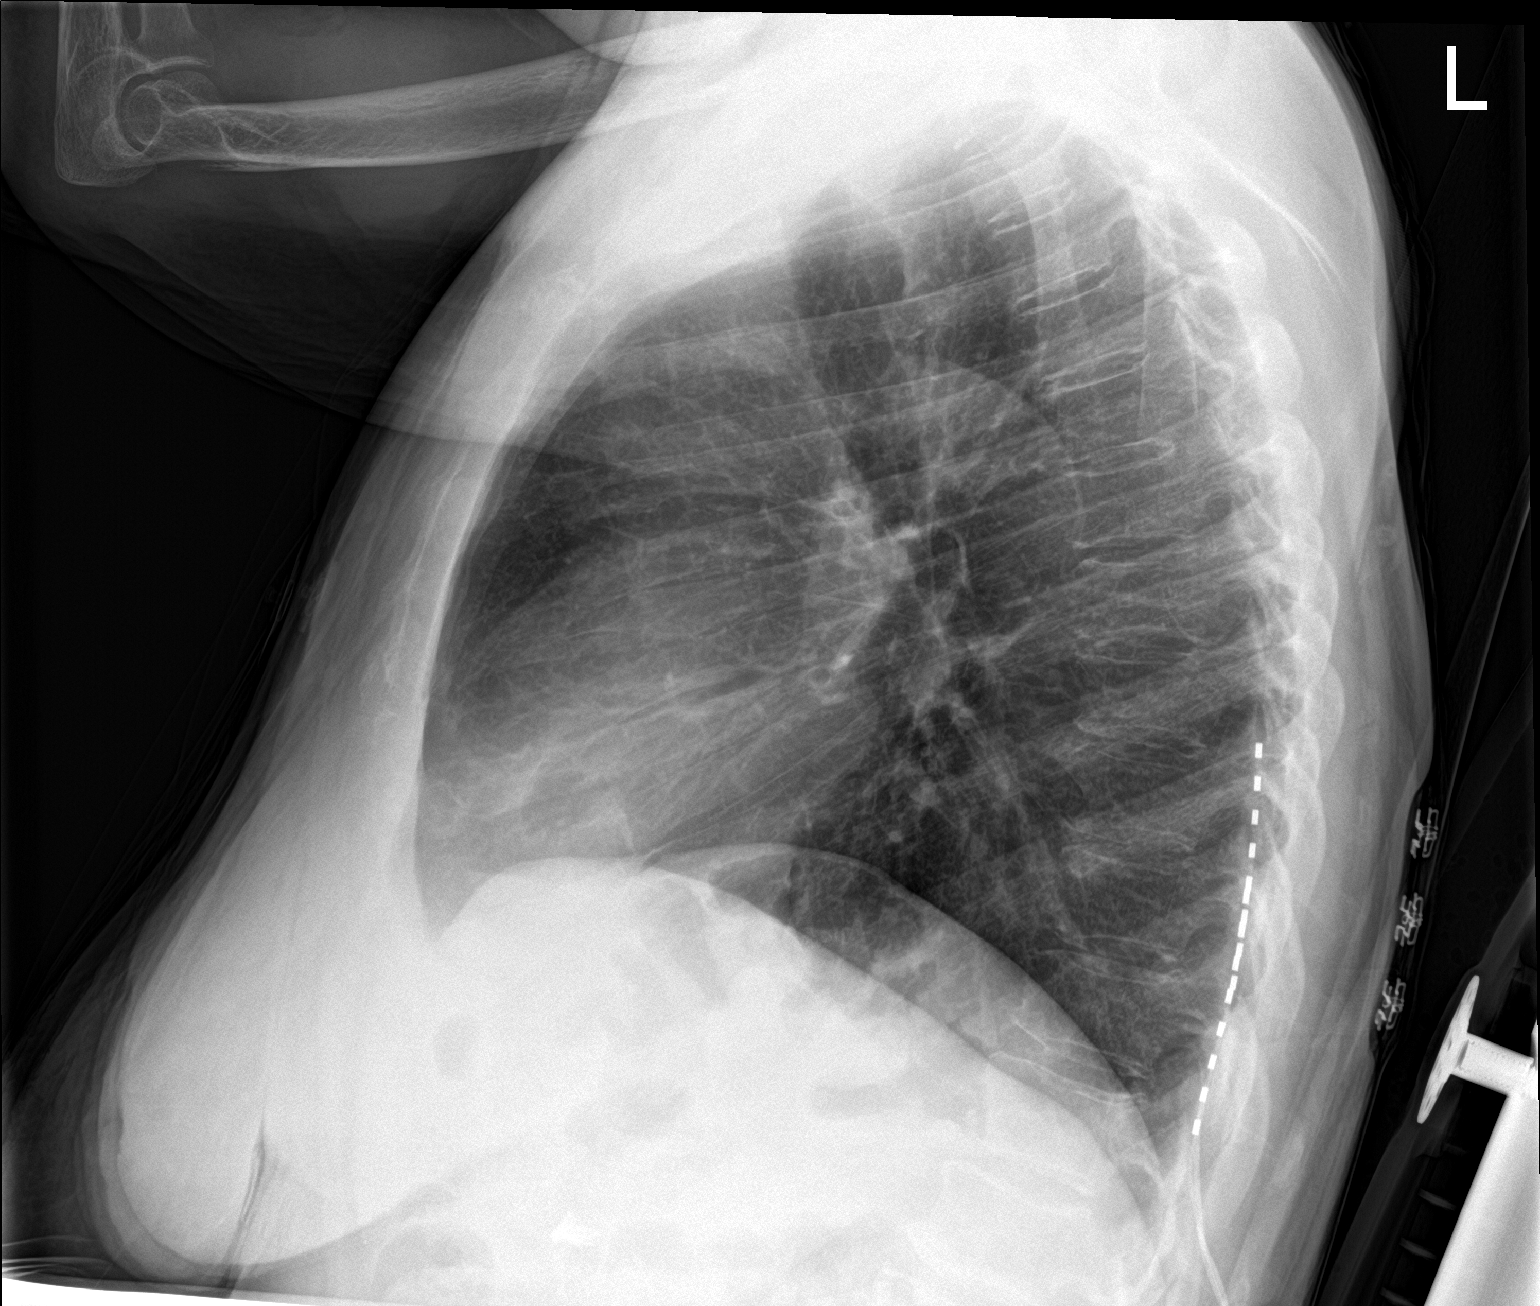

[chest ap]
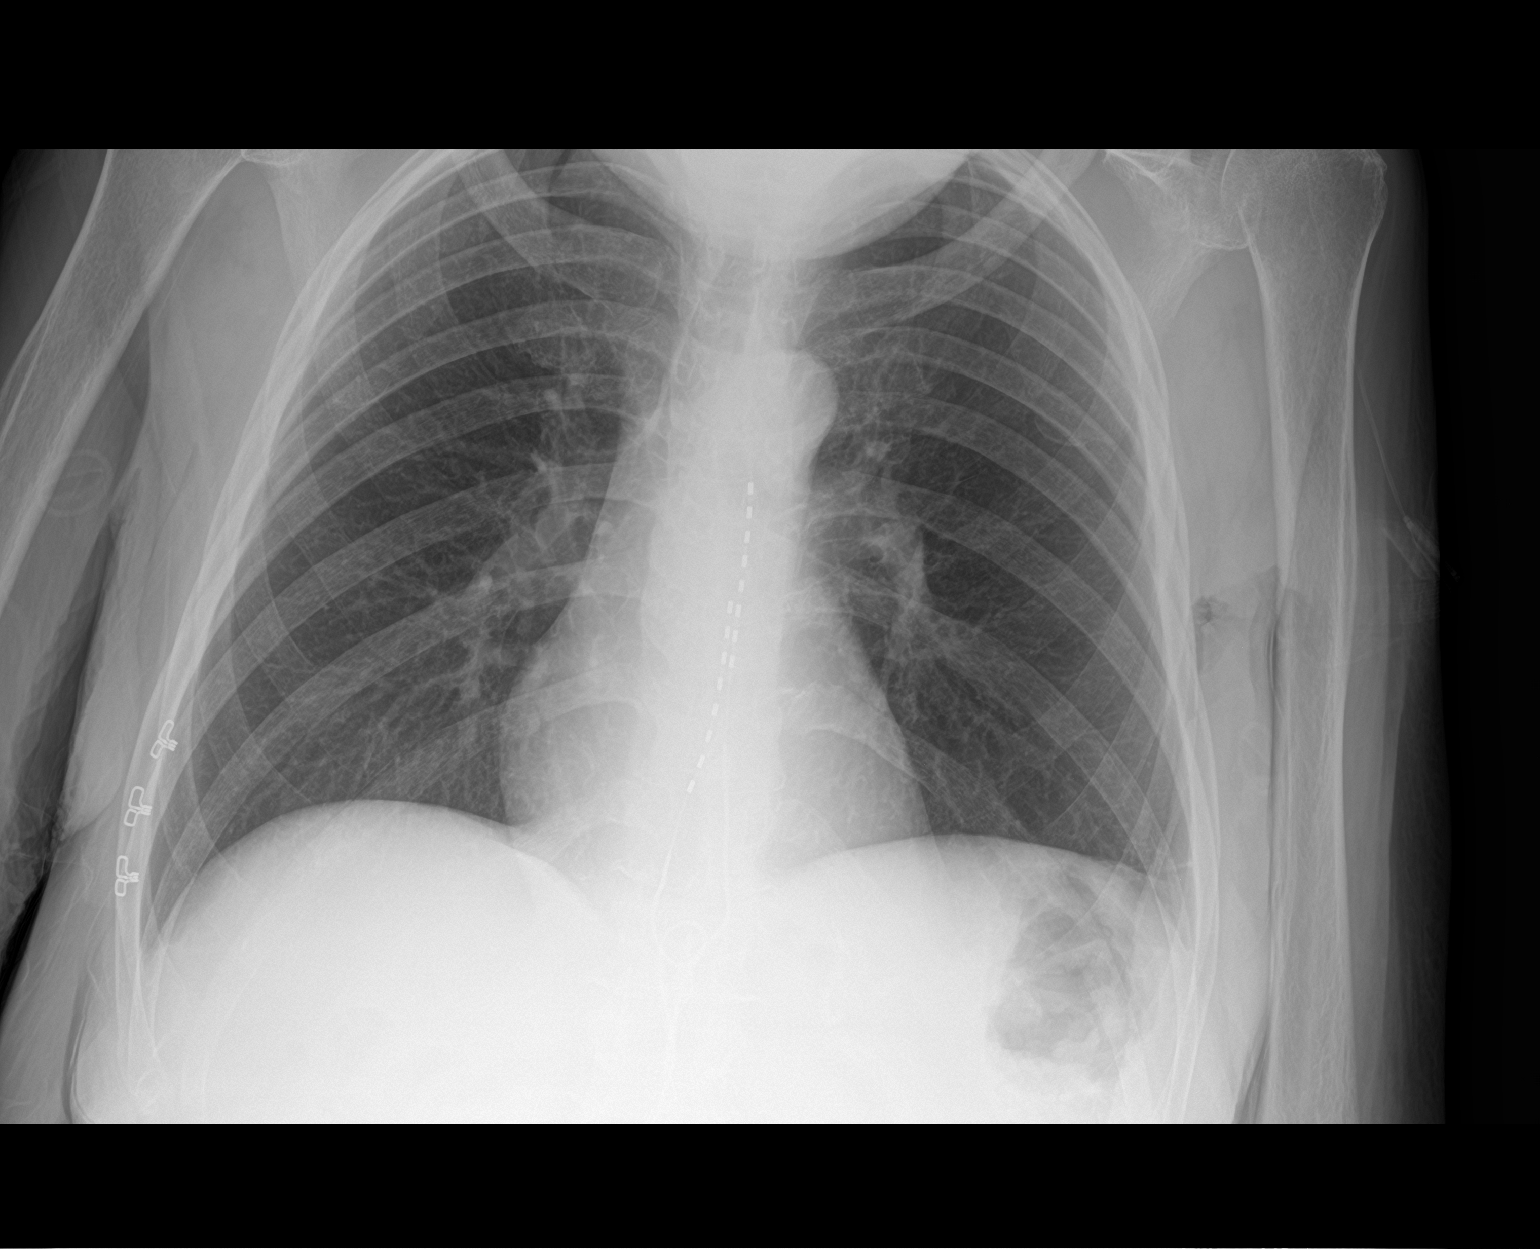

[2 of 2 positions shown; findings below may reference images not displayed]

FINDINGS: Cardiac shadow is stable. Spinal stimulator is now seen in place.
The lungs are well aerated bilaterally. No acute bony abnormality is
seen.
IMPRESSION: No active cardiopulmonary disease.

## 2022-03-17 IMAGING — CT CT ABD-PELV W/ CM
2 of 5 series · 15 of 46 positions shown, 17 images · IV contrast (omnipaque)
Comparison: 04/15/2021, 08/27/2020

CLINICAL DATA: Chest pain, abdominal distension

EXAM:
CT ANGIOGRAPHY CHEST
CT ABDOMEN AND PELVIS WITH CONTRAST
TECHNIQUE: Multidetector CT imaging of the chest was performed using the
standard protocol during bolus administration of intravenous
contrast. Multiplanar CT image reconstructions and MIPs were
obtained to evaluate the vascular anatomy. Multidetector CT imaging
of the abdomen and pelvis was performed using the standard protocol
during bolus administration of intravenous contrast.
CONTRAST:  100mL OMNIPAQUE IOHEXOL 350 MG/ML SOLN

[Series 3: abdomen 5.0 (person_name) · axial · 0.70mm/px · z∈[-964,-584]mm · 12 of 88 slices shown, 14 images]
[im 6/88  soft-tissue]
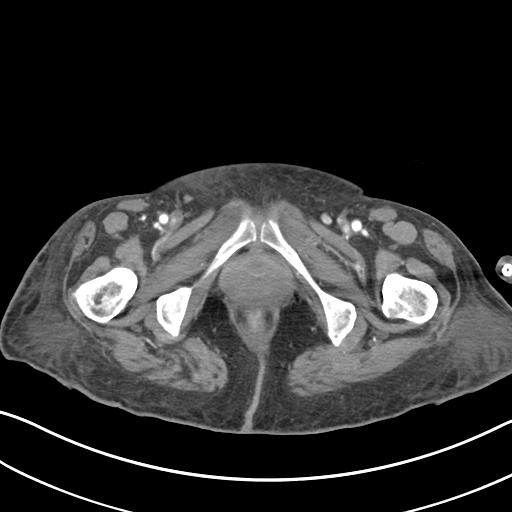
[im 6/88  bone]
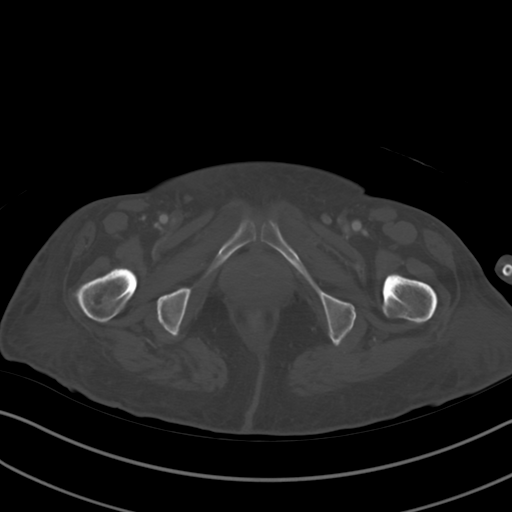
[im 11/88  soft-tissue]
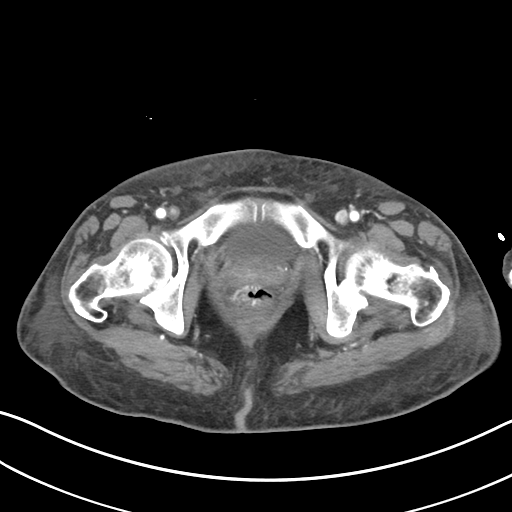
[im 22/88  soft-tissue]
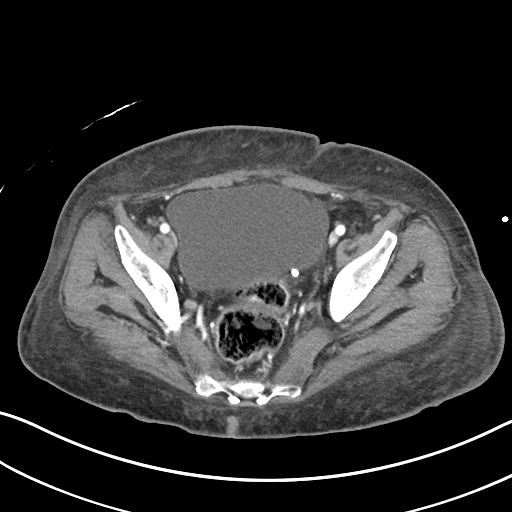
[im 28/88  soft-tissue]
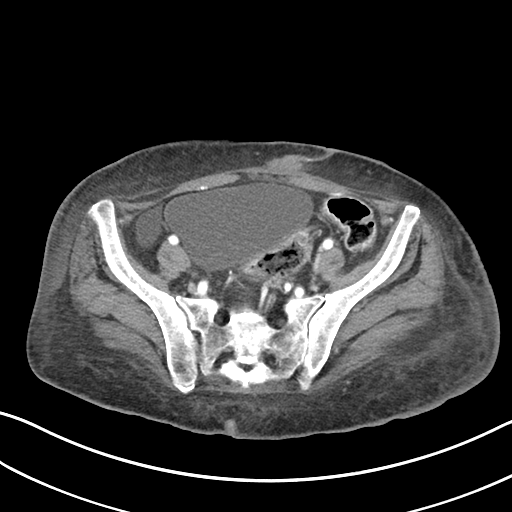
[im 33/88  soft-tissue]
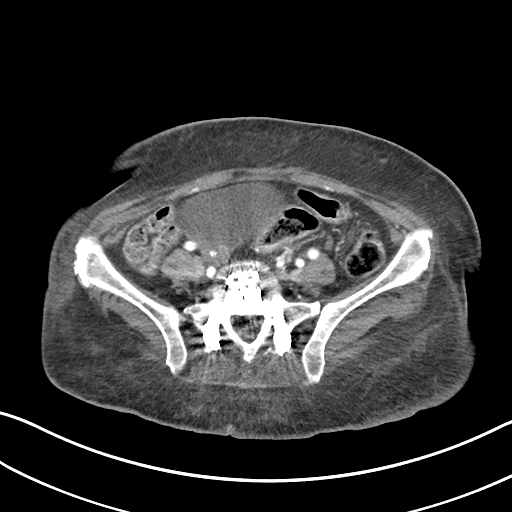
[im 39/88  soft-tissue]
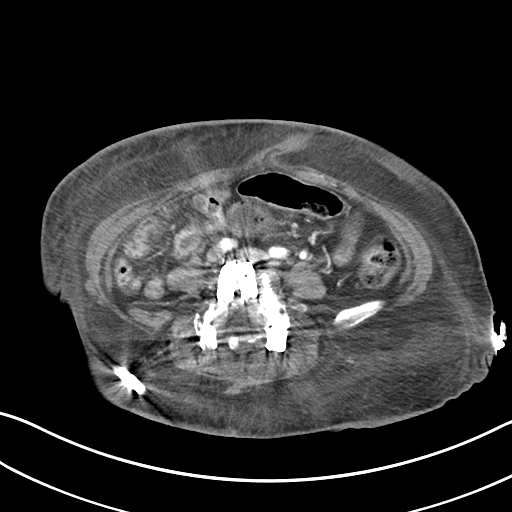
[im 49/88  soft-tissue]
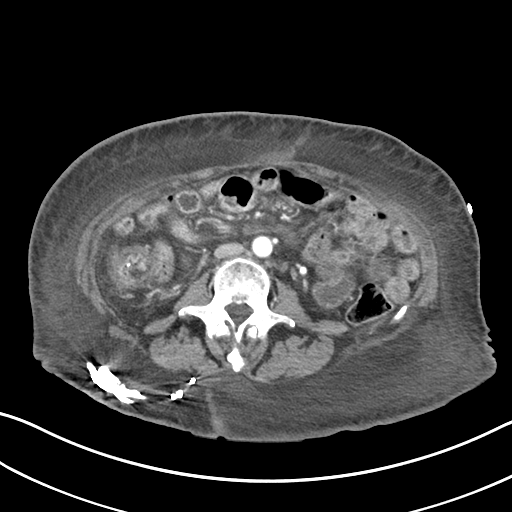
[im 55/88  soft-tissue]
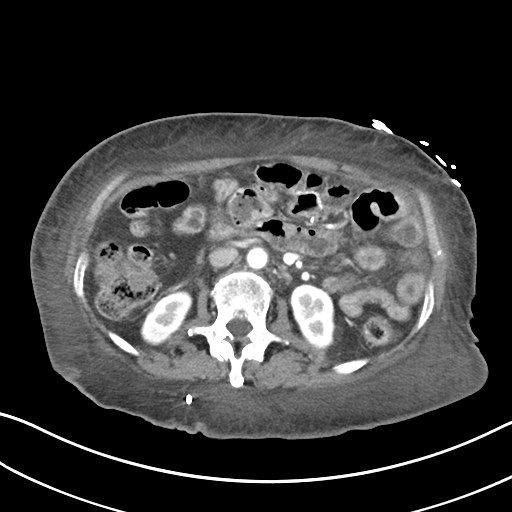
[im 60/88  soft-tissue]
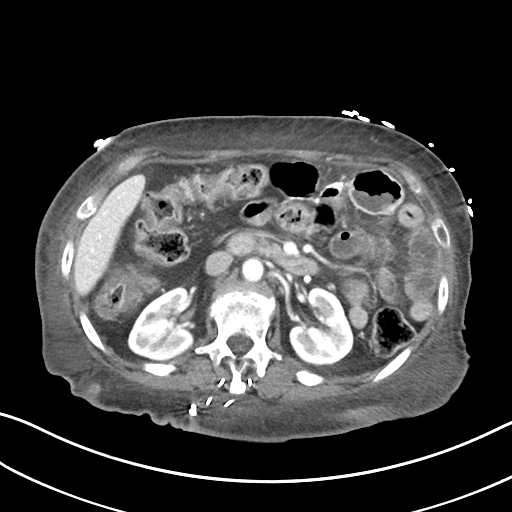
[im 60/88  bone]
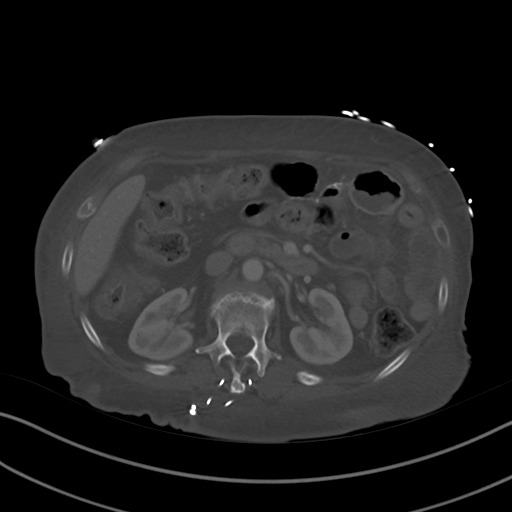
[im 66/88  soft-tissue]
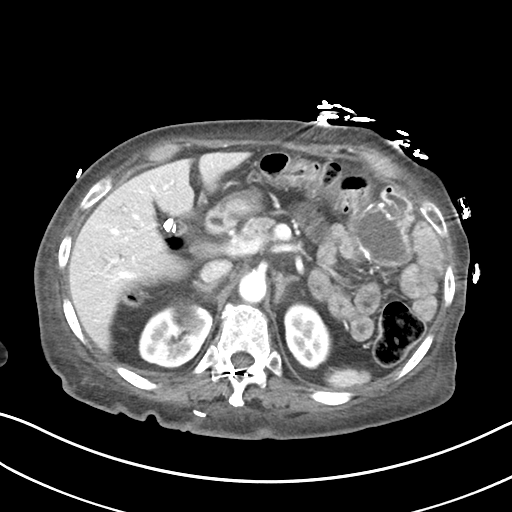
[im 77/88  soft-tissue]
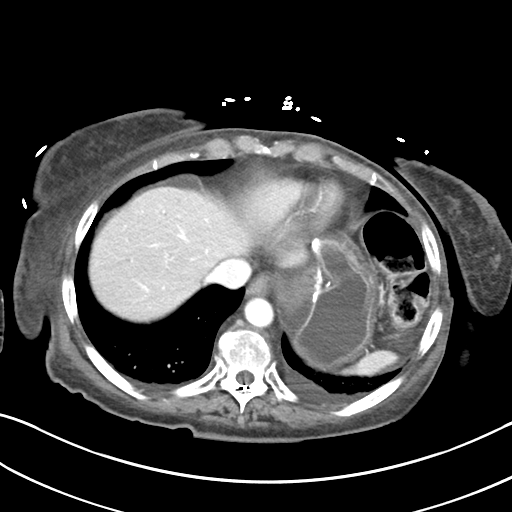
[im 82/88  soft-tissue]
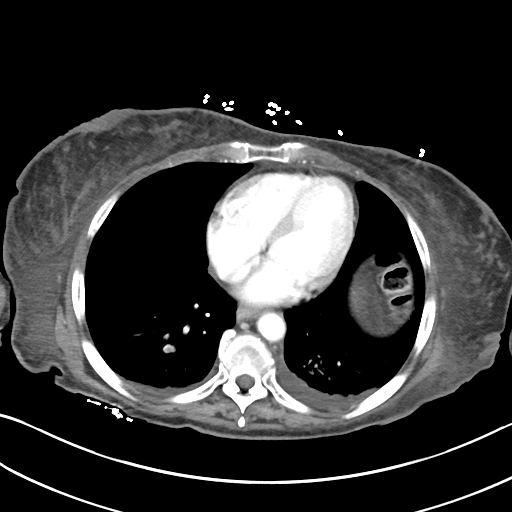

[Series 6: abdomen 3.0 (person_name) · coronal · 0.69mm/px · 3 of 77 slices shown]
[im 26/77  soft-tissue]
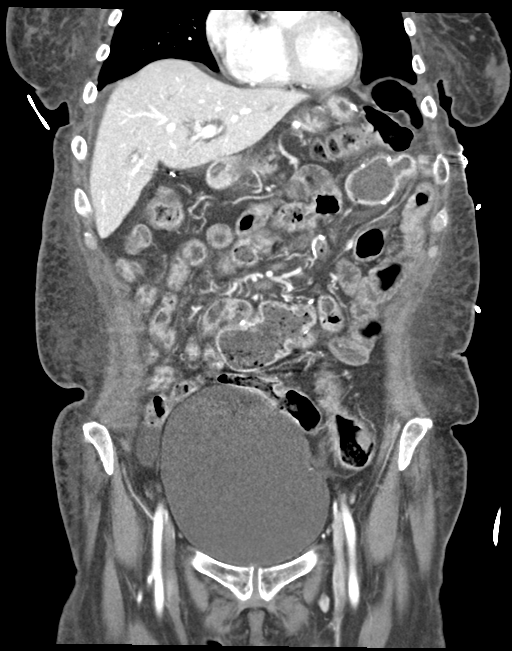
[im 34/77  soft-tissue]
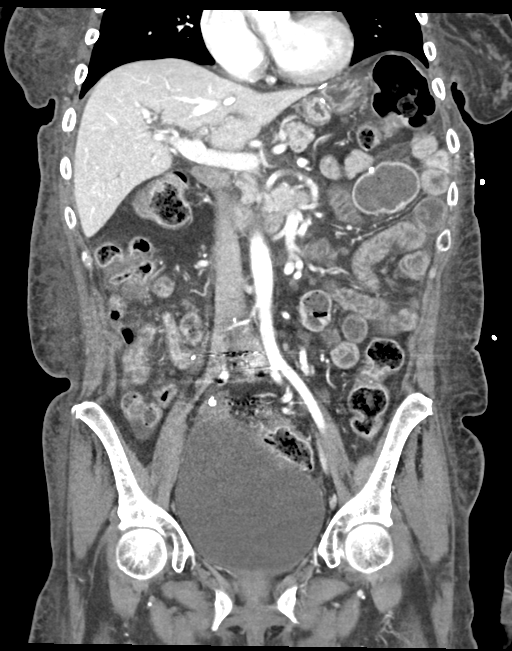
[im 43/77  soft-tissue]
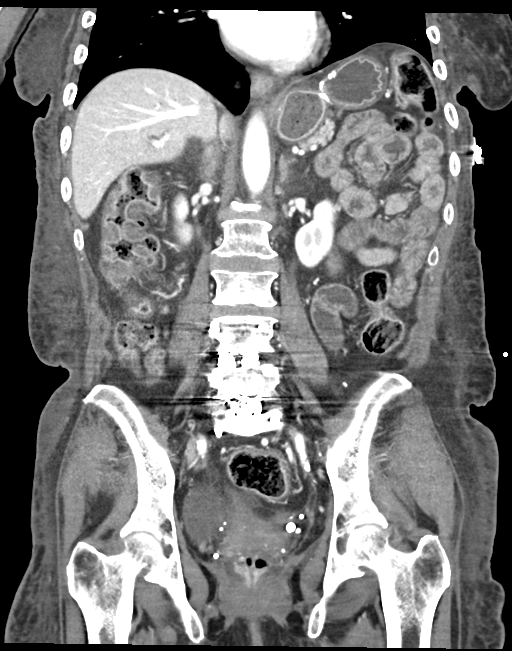

[15 of 46 positions shown; findings below may reference images not displayed]

FINDINGS: CTA CHEST FINDINGS

Cardiovascular: This is a technically adequate evaluation of the
pulmonary vasculature. Acute segmental right lower lobe pulmonary
embolus within the posterior basilar segment. Clot burden is
minimal. No other filling defects.

The heart is unremarkable without pericardial effusion. Normal
caliber of the thoracic aorta.

Mediastinum/Nodes: Stable multinodular goiter, previously evaluated
in 2050. Trachea and esophagus are unremarkable. No pathologic
adenopathy.

Lungs/Pleura: There are small bilateral pleural effusions. No acute
airspace disease or pneumothorax. Dependent hypoventilatory changes
are seen within the lower lobes. The central airways are patent.

Musculoskeletal: No acute or destructive bony lesions. Diffuse chest
wall edema consistent with anasarca. Reconstructed images
demonstrate no additional findings.

Review of the MIP images confirms the above findings.

CT ABDOMEN and PELVIS FINDINGS

Hepatobiliary: Gallbladder is surgically absent. No focal liver
abnormalities. Mild prominence of the intrahepatic and extrahepatic
biliary system is likely related to prior cholecystectomy.

Pancreas: Unremarkable. No pancreatic ductal dilatation or
surrounding inflammatory changes.

Spleen: Normal in size without focal abnormality.

Adrenals/Urinary Tract: Bilateral renal cysts are again noted. No
urinary tract calculi or obstructive uropathy. The adrenals and
bladder are grossly normal.

Stomach/Bowel: No bowel obstruction or ileus. Normal appendix right
lower quadrant. Postsurgical changes from previous bariatric
surgery. No bowel wall thickening or inflammatory change.

Vascular/Lymphatic: No significant vascular findings are present. No
enlarged abdominal or pelvic lymph nodes.

Reproductive: Status post hysterectomy. No adnexal masses.

Other: Diffuse body wall edema consistent with anasarca. No free
intraperitoneal fluid or free gas. No abdominal wall hernia.

Musculoskeletal: No acute or destructive bony lesions. Postsurgical
changes are seen within the lower lumbar spine. Spinal stimulator
again noted, leads within the central thoracic canal.

Review of the MIP images confirms the above findings.
IMPRESSION: 1. Right lower lobe segmental pulmonary embolus. Minimal clot
burden. No evidence of right heart strain.
2. Small bilateral pleural effusions.
3. Diffuse body wall edema compatible with anasarca.

Critical Value/emergent results were called by telephone at the time
of interpretation on 04/15/2021 at [DATE] to provider DR LEMRANI, who
verbally acknowledged these results.

## 2022-03-31 IMAGING — CT CT ORBITS W/ CM
3 series · 13 of 47 positions shown, 15 images · IV contrast (omnipaque)
Comparison: No pertinent prior exam.

CLINICAL DATA: Left facial swelling

EXAM:
CT ORBITS WITH CONTRAST
TECHNIQUE: Multidetector CT images was performed according to the standard
protocol following intravenous contrast administration.
CONTRAST:  75mL OMNIPAQUE IOHEXOL 300 MG/ML  SOLN

[Series 3: orbits w/ 2.0 ax st · axial · 0.32mm/px · z∈[+1091,+1169]mm · 7 of 48 slices shown, 9 images]
[im 5/48  brain]
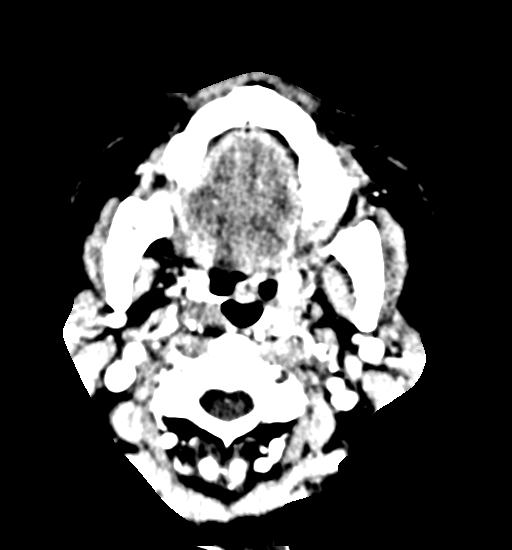
[im 5/48  bone]
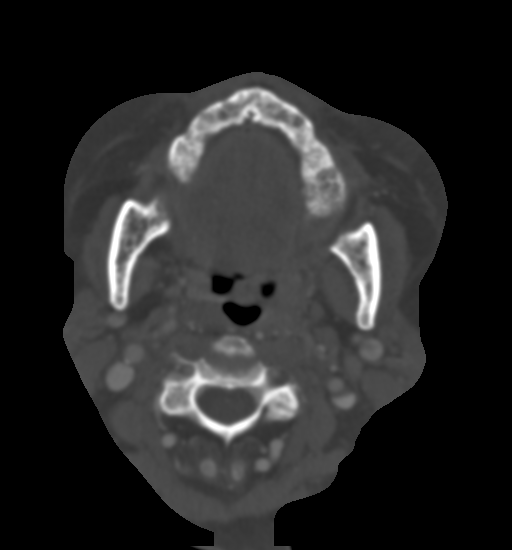
[im 12/48  bone]
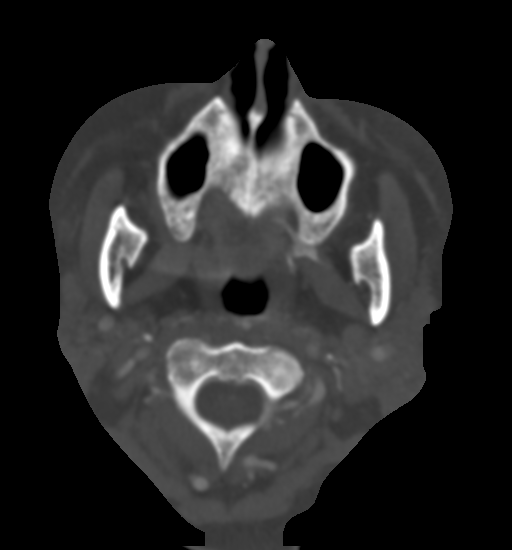
[im 18/48  bone]
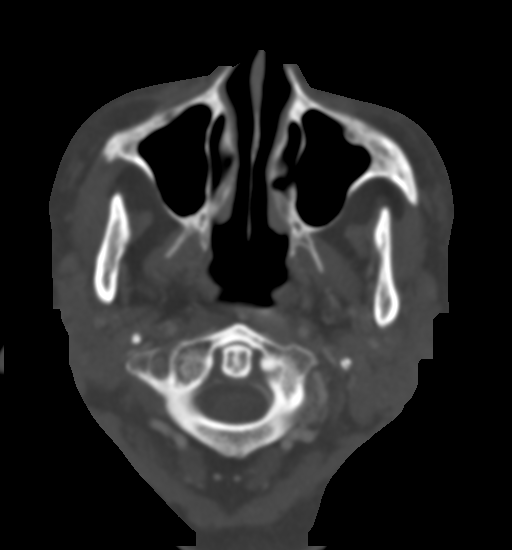
[im 25/48  bone]
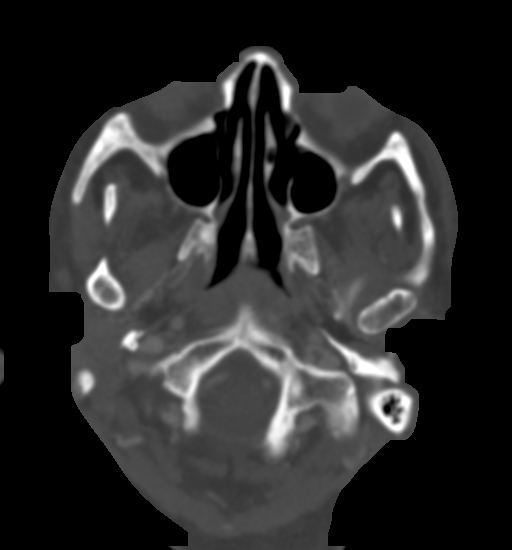
[im 31/48  brain]
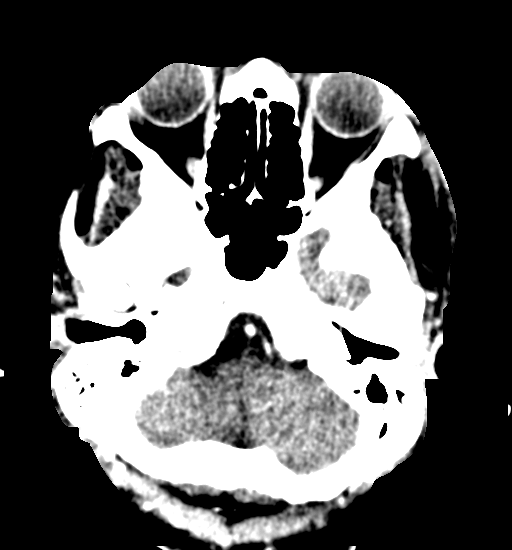
[im 31/48  bone]
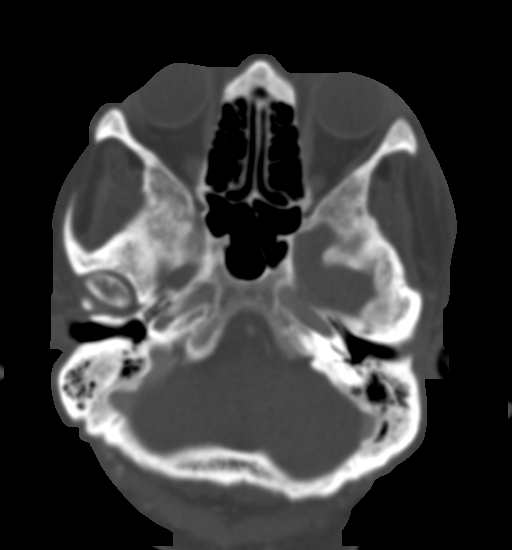
[im 38/48  bone]
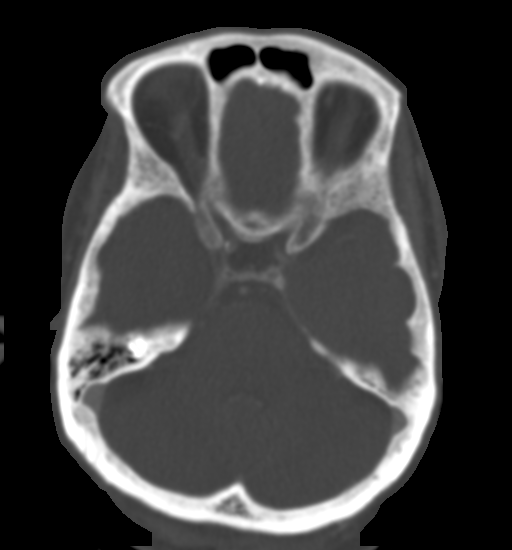
[im 44/48  bone]
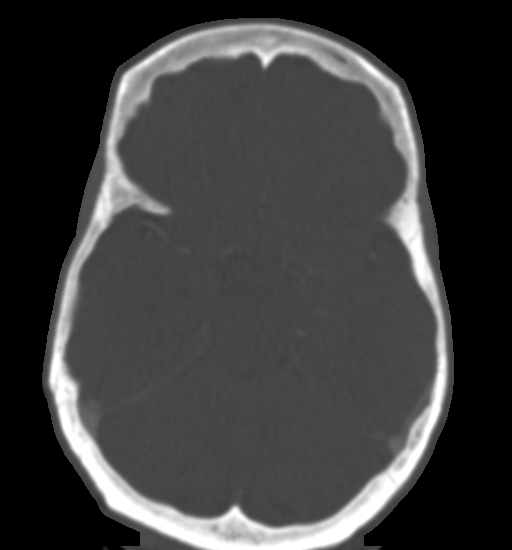

[Series 7: orbits w/ 2.0 cor st · coronal · 0.19mm/px · 3 of 90 slices shown]
[im 30/90  bone]
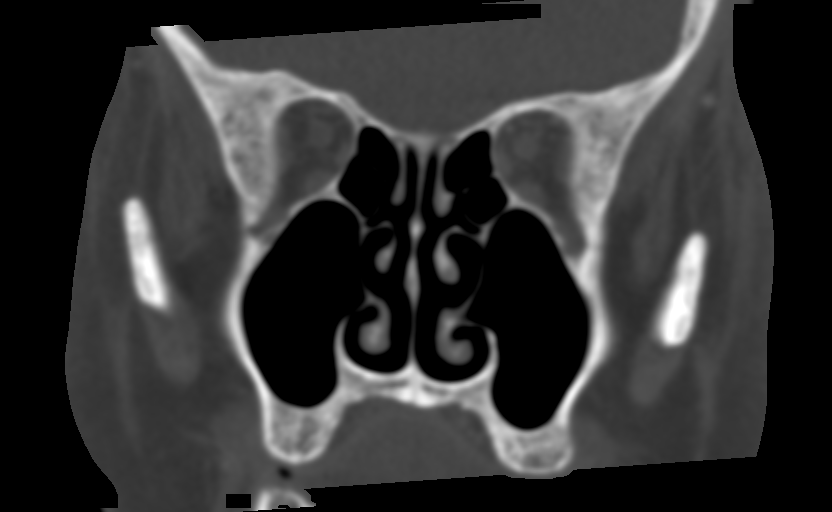
[im 40/90  bone]
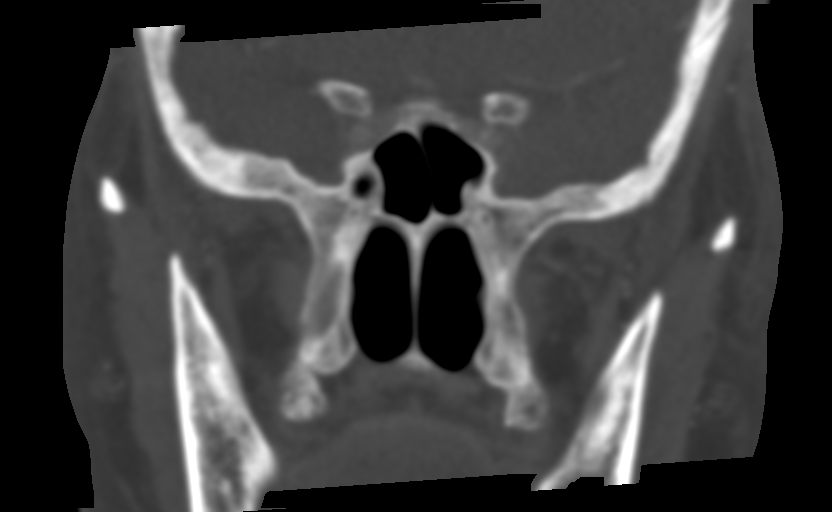
[im 50/90  bone]
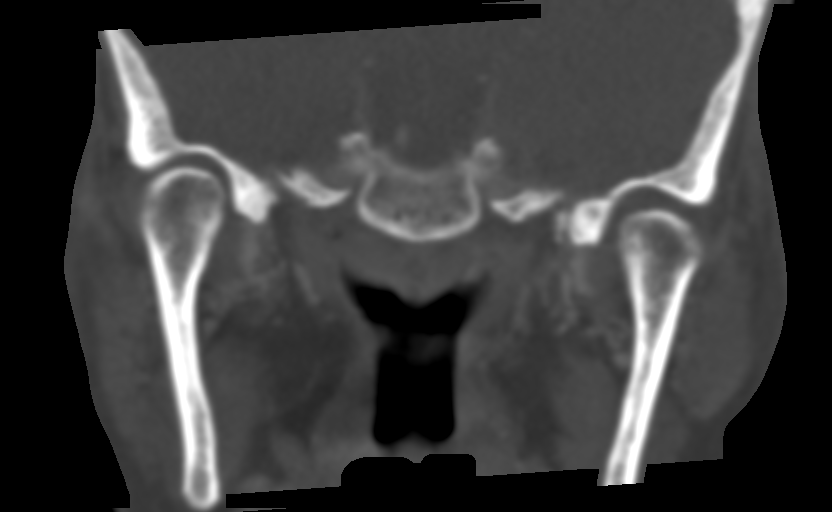

[Series 8: orbits w/ 2.0 sag st · sagittal · 0.19mm/px · 3 of 74 slices shown]
[im 25/74  bone]
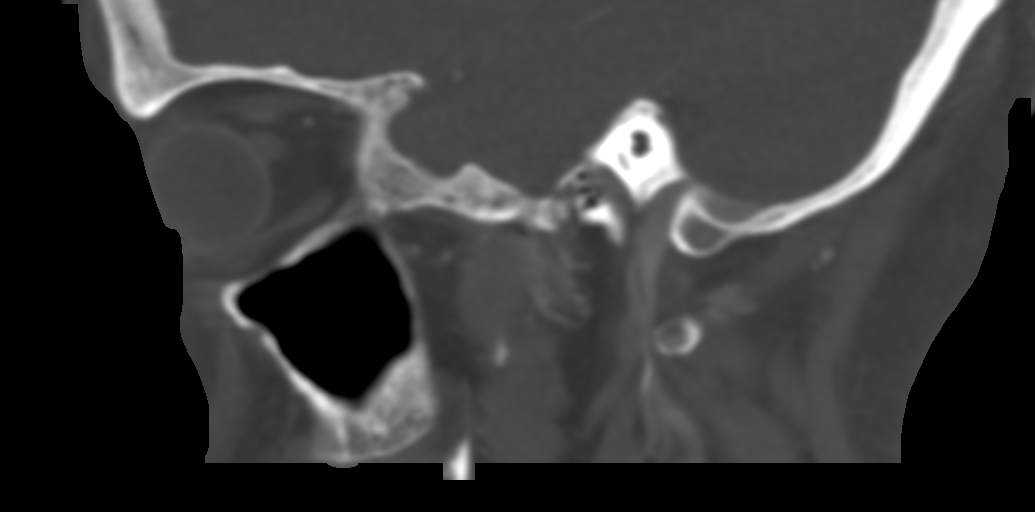
[im 37/74  bone]
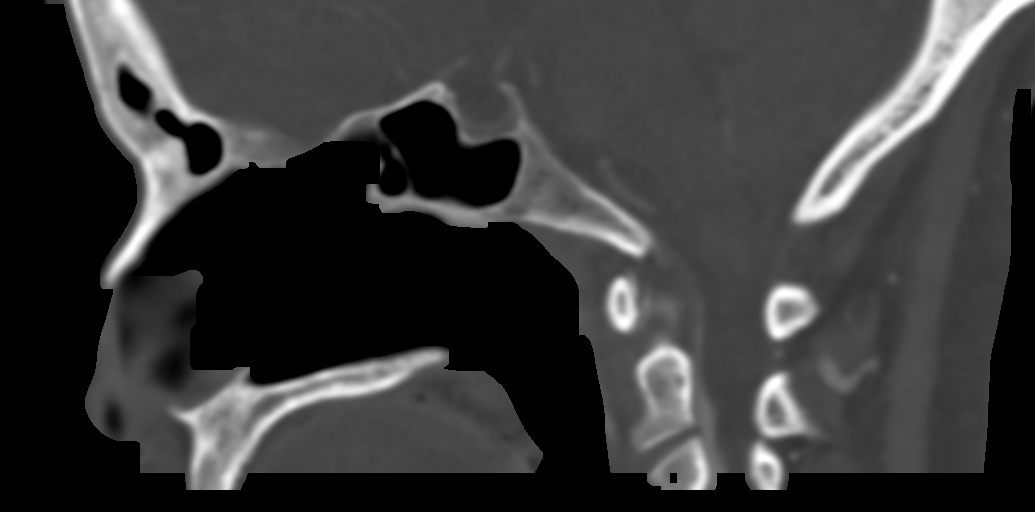
[im 49/74  bone]
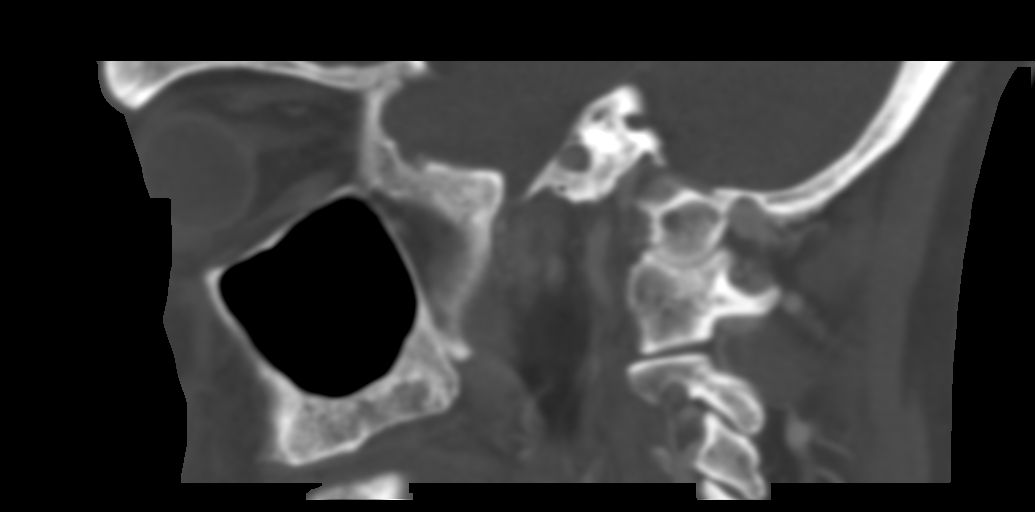

[13 of 47 positions shown; findings below may reference images not displayed]

FINDINGS: Orbits: No orbital mass or evidence of inflammation. Normal
appearance of the globes, optic nerve-sheath complexes, extraocular
muscles, orbital fat and lacrimal glands.

Visible paranasal sinuses: Clear.

Soft tissues: There is mild subcutaneous inflammatory change in the
left face. No fluid collection.

Osseous: No fracture or aggressive lesion.

Limited intracranial: No acute or significant finding.
IMPRESSION: Mild subcutaneous inflammatory change in the left face, likely
cellulitis. No abscess or fluid collection. No orbital abnormality.

## 2022-03-31 IMAGING — DX DG CHEST 2V
2 series · 2 of 2 positions shown · non-contrast
Comparison: 04/15/2021 CT a chest and chest radiograph from
04/15/2021

CLINICAL DATA: Shortness of breath, chest pain

EXAM:
CHEST - 2 VIEW

[chest pa]
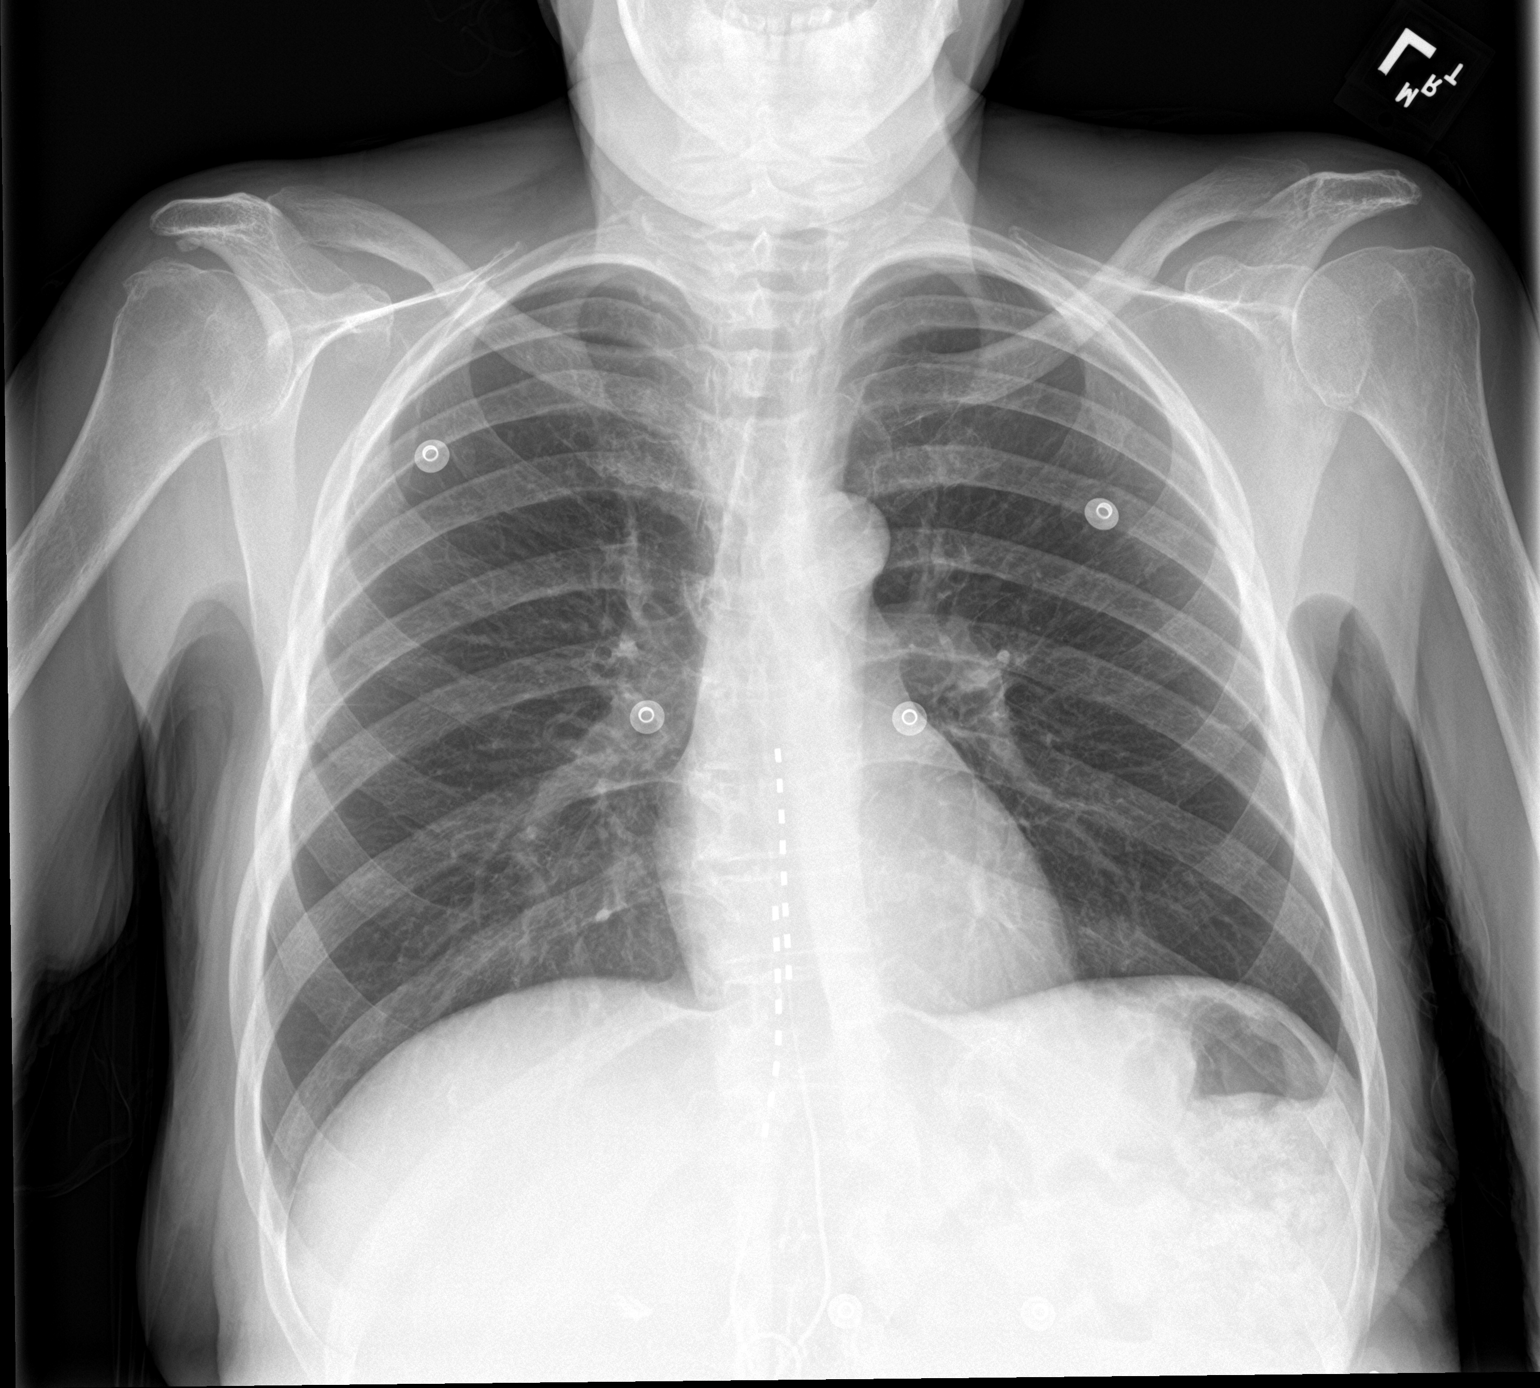

[chest lat]
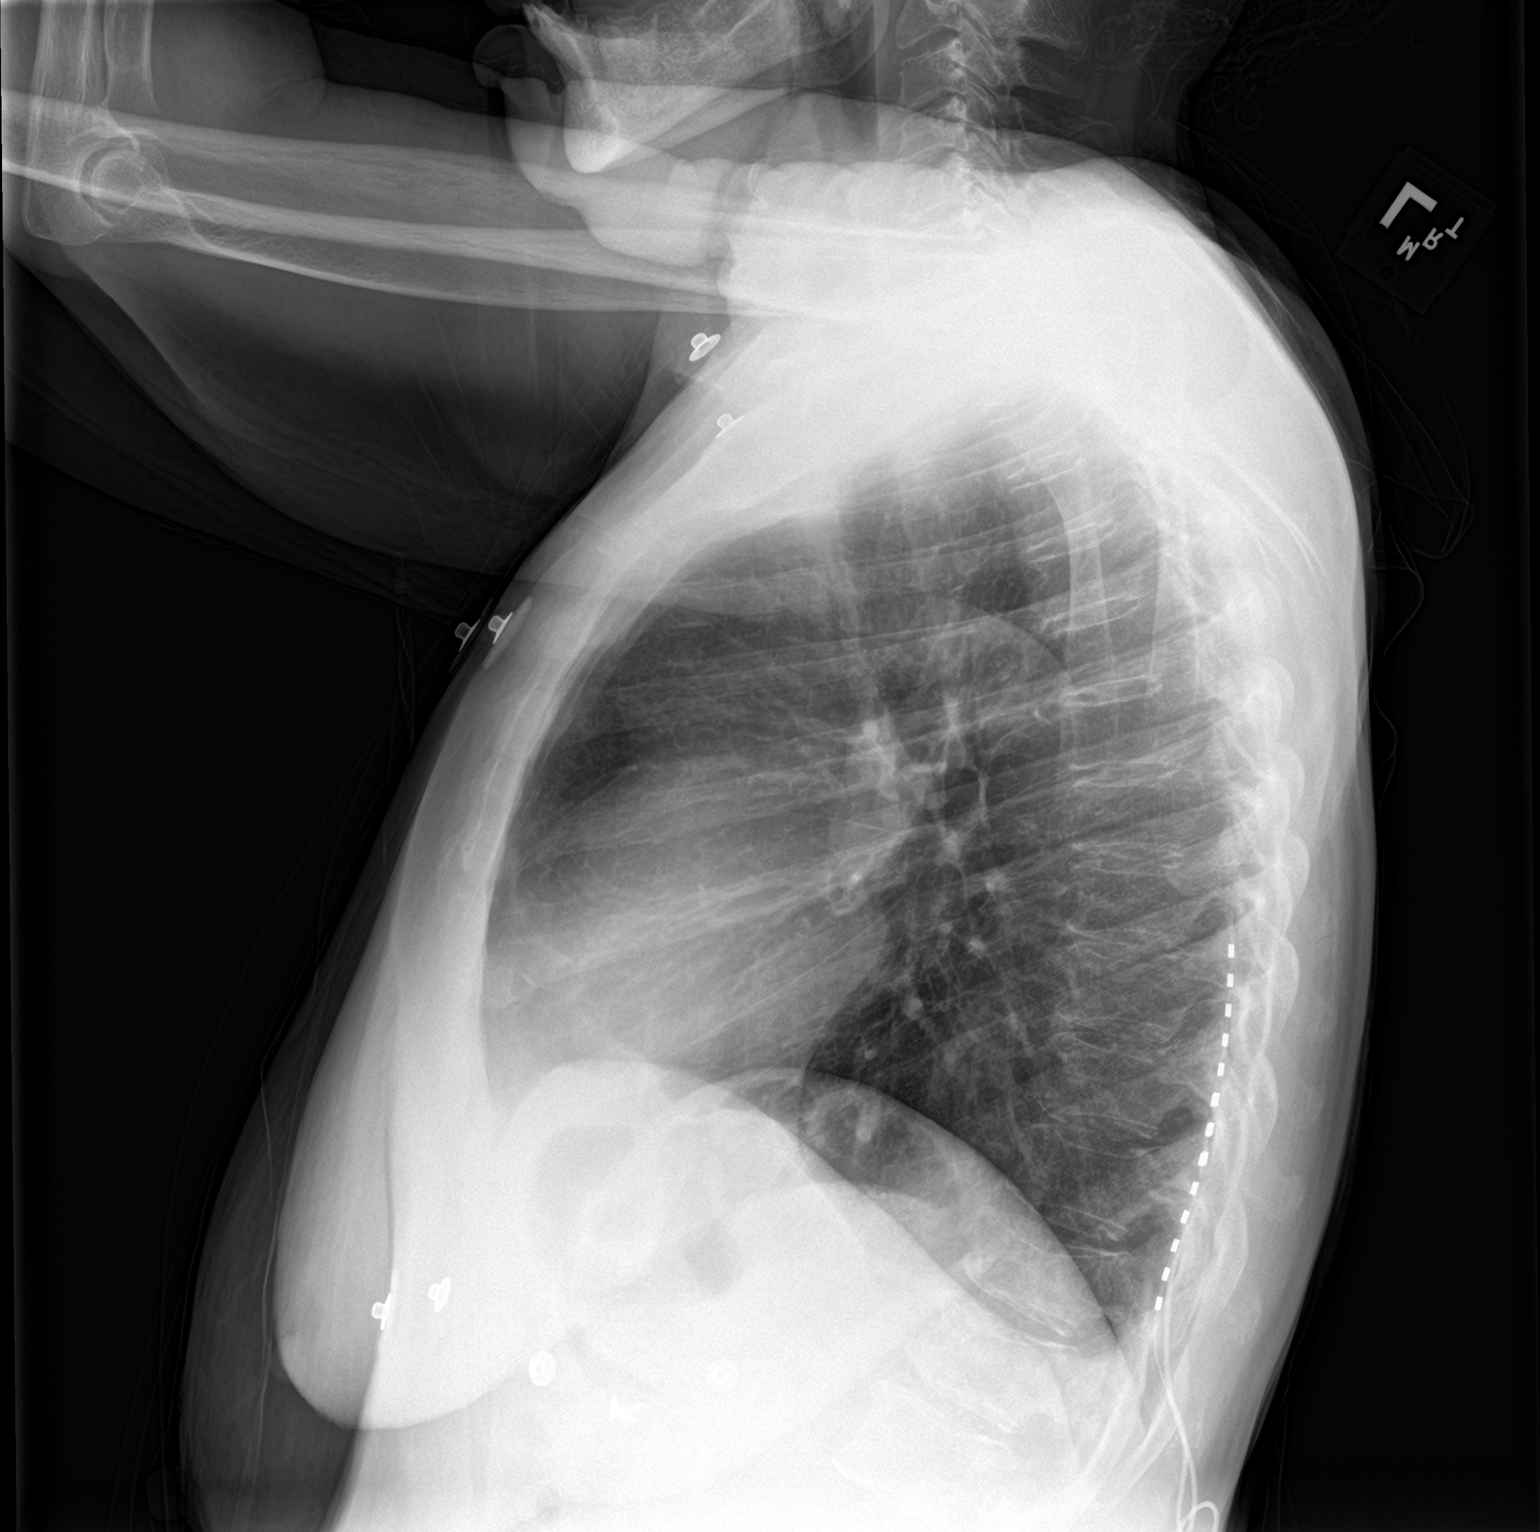

[2 of 2 positions shown; findings below may reference images not displayed]

FINDINGS: Similar appearance of dorsal column stimulator with left-sided leads
from T7-8 through T9-10, and right-sided leads from T12.

The lungs appear clear. Cardiac and mediastinal contours normal. No
pleural effusion identified.
IMPRESSION: 1.  No active cardiopulmonary disease is radiographically apparent.

## 2022-06-14 IMAGING — US US RENAL
1 series · 14 of 25 positions shown · non-contrast
Comparison: None.

CLINICAL DATA: Urinary retention

EXAM:
RENAL / URINARY TRACT ULTRASOUND COMPLETE

[Series 1: us renal · 14 of 37 slices shown]
[im 1/37]
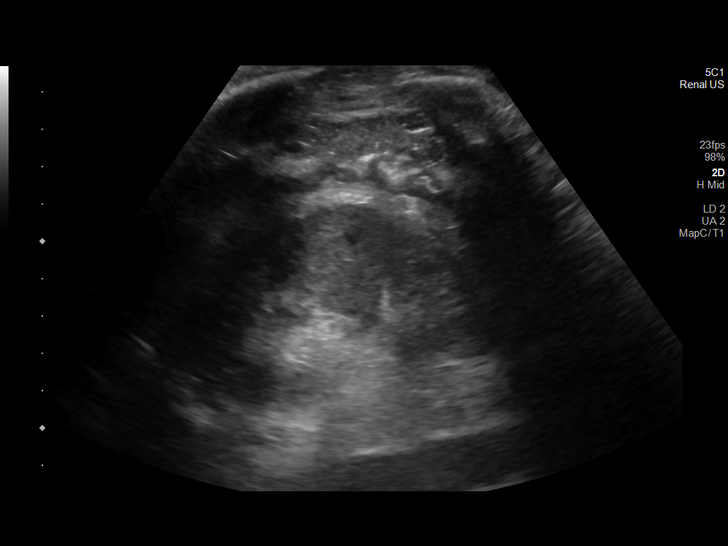
[im 4/37]
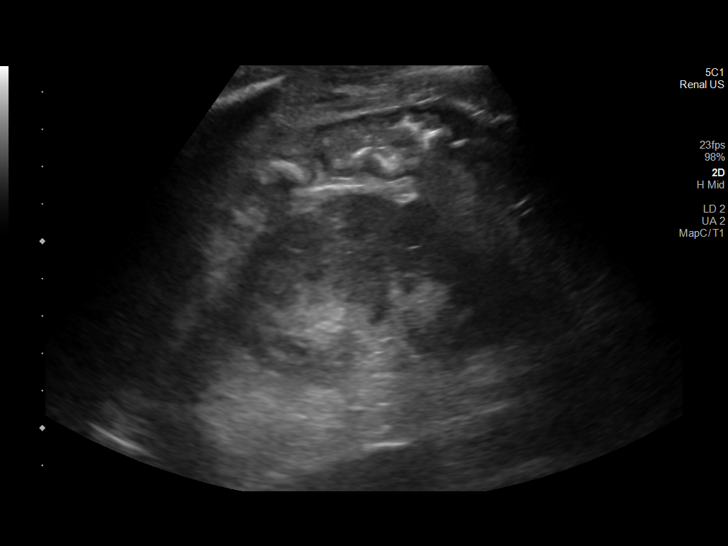
[im 7/37]
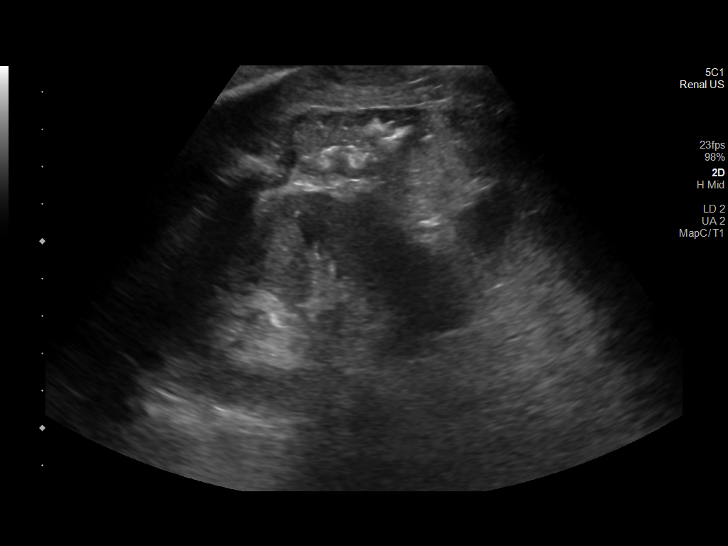
[im 10/37]
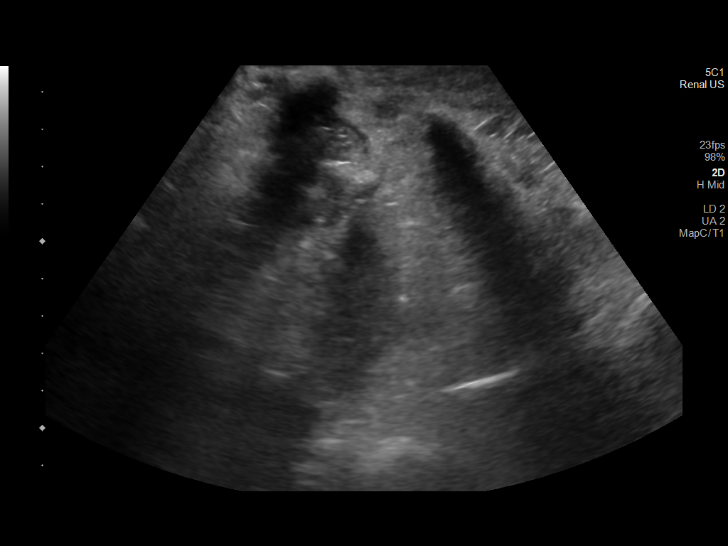
[im 13/37]
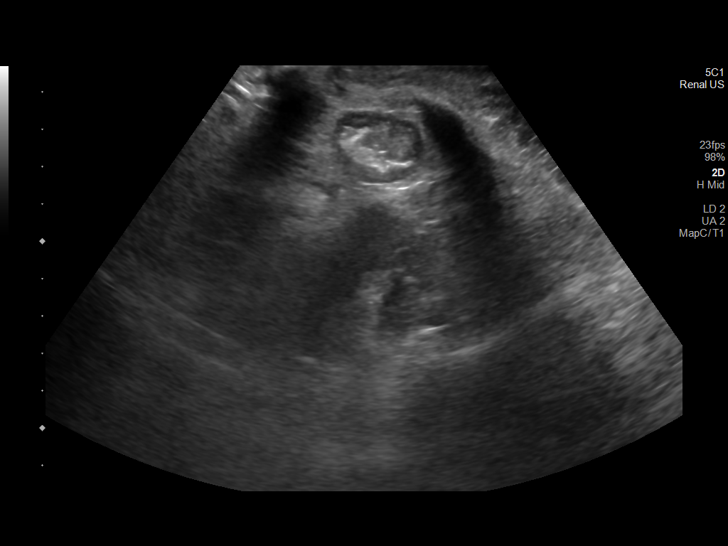
[im 14/37]
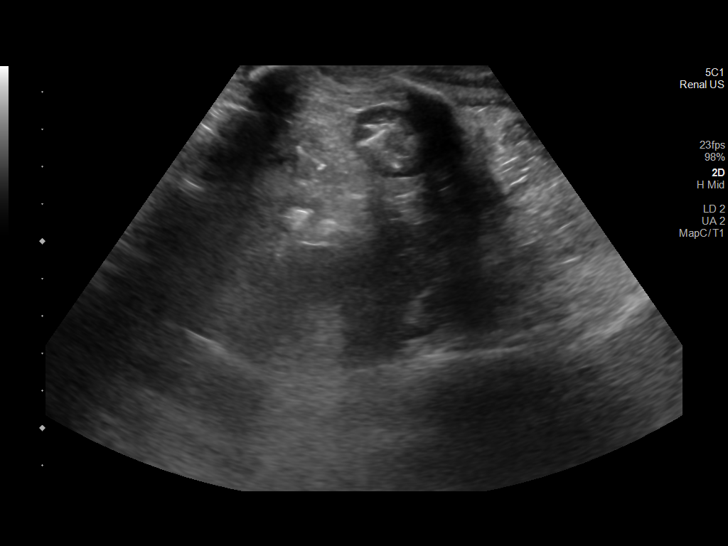
[im 17/37]
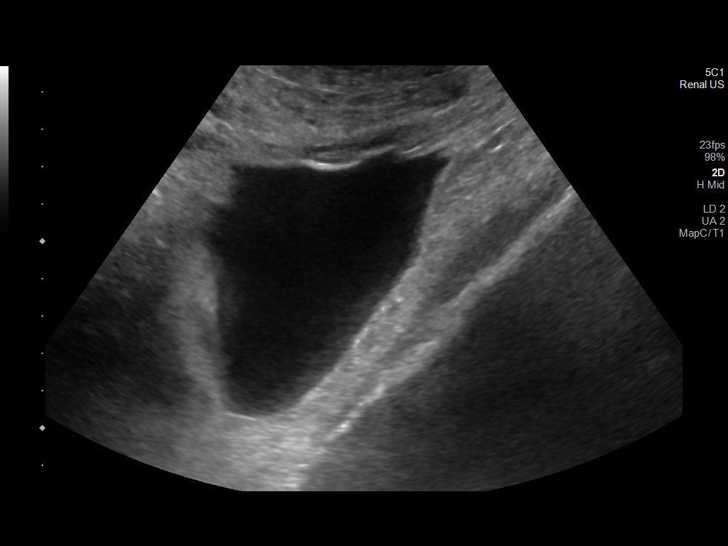
[im 20/37]
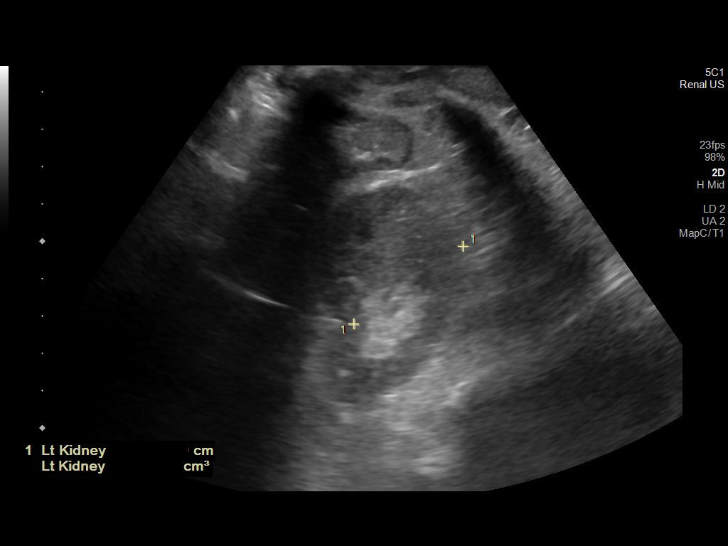
[im 23/37]
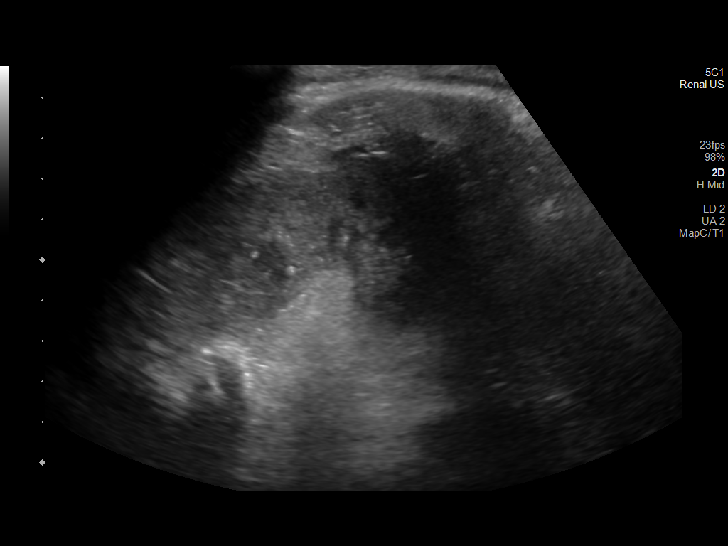
[im 25/37]
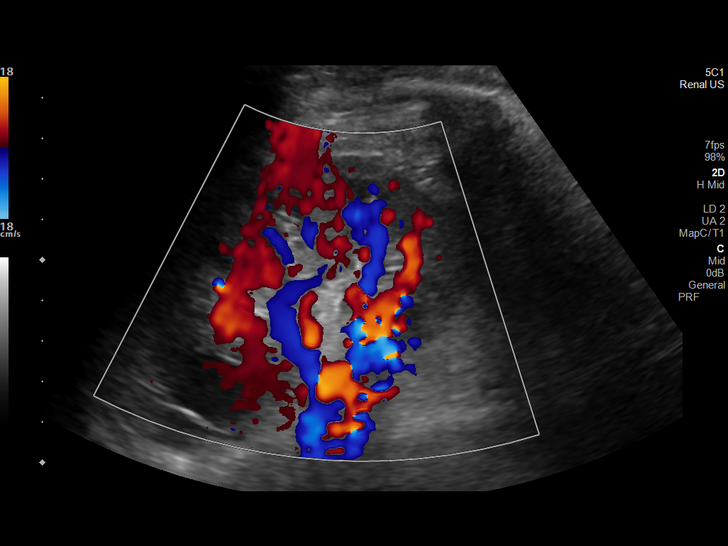
[im 28/37]
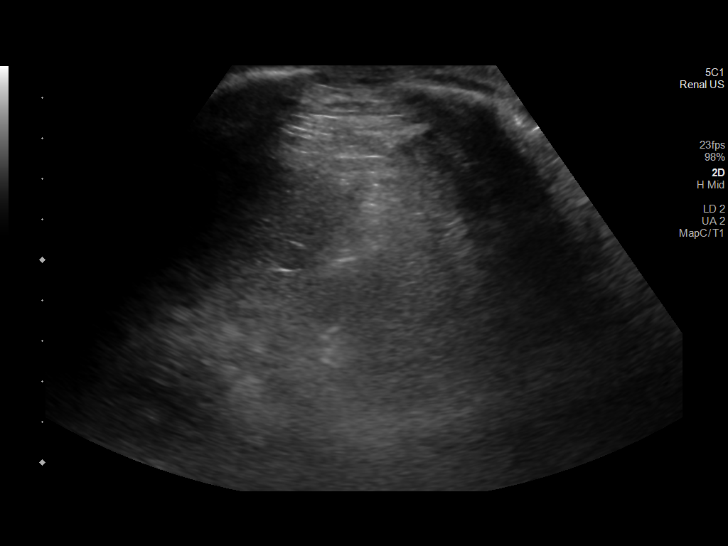
[im 31/37]
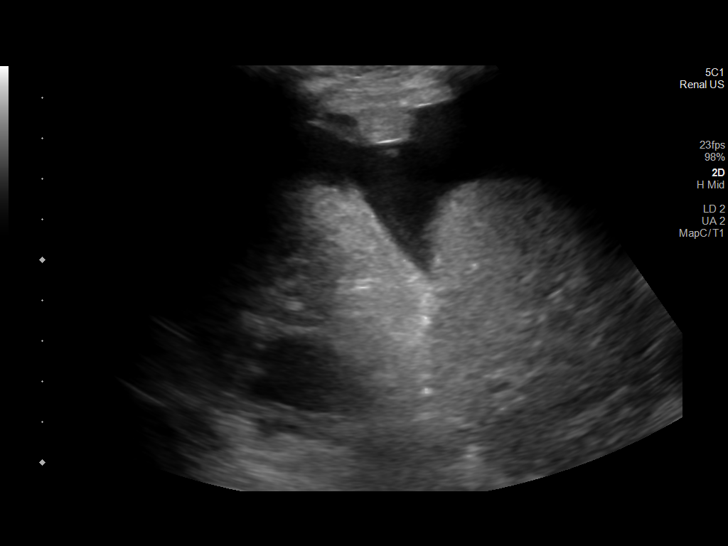
[im 34/37]
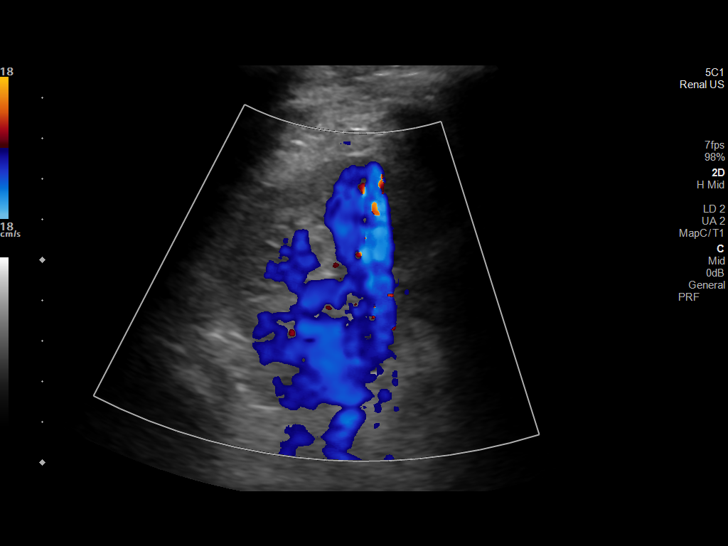
[im 37/37]
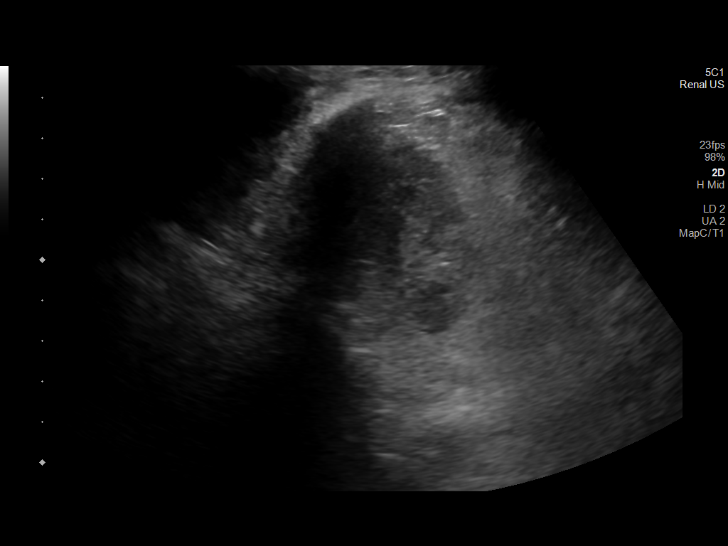

[14 of 25 positions shown; findings below may reference images not displayed]

FINDINGS: Right Kidney:

Renal measurements: 8.4 x 5.1 x 3.8 cm = volume: 87 mL. Increased
renal cortical echogenicity. Simple cyst. No mass or hydronephrosis
visualized.

Left Kidney:

Renal measurements: 8.4 x 5.2 x 3.6 cm = volume: 82 mL. Increased
renal cortical echogenicity. No mass or hydronephrosis visualized.

Bladder:

Appears normal for degree of bladder distention.

Other:

None.
IMPRESSION: Increased renal cortical echogenicity, in keeping with medical renal
disease. No hydronephrosis.

## 2022-06-14 IMAGING — CT CT HEAD W/O CM
4 series · 16 of 47 positions shown, 18 images · non-contrast
Comparison: 03/27/2021

CLINICAL DATA: Head/neck trauma

EXAM:
CT HEAD WITHOUT CONTRAST
CT CERVICAL SPINE WITHOUT CONTRAST
TECHNIQUE: Multidetector CT imaging of the head and cervical spine was
performed following the standard protocol without intravenous
contrast. Multiplanar CT image reconstructions of the cervical spine
were also generated.

[Series 2: head wo · axial · 0.41mm/px · z∈[-654,-534]mm · 7 of 33 slices shown, 9 images]
[im 5/33  brain]
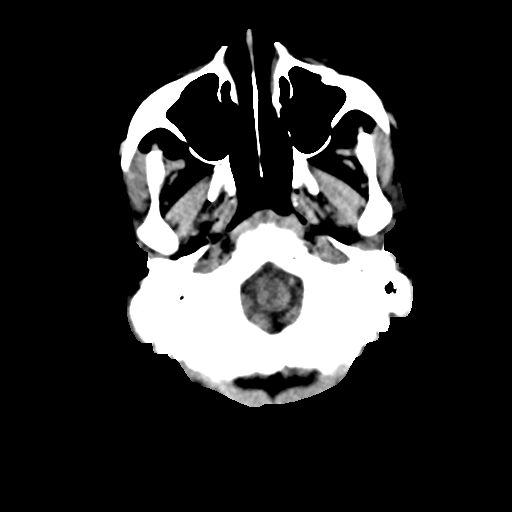
[im 5/33  bone]
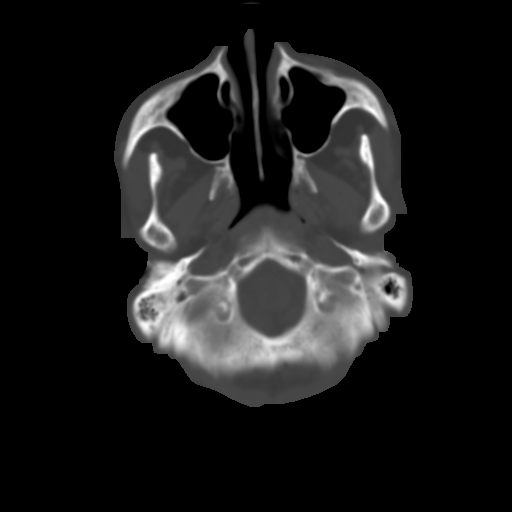
[im 9/33  brain]
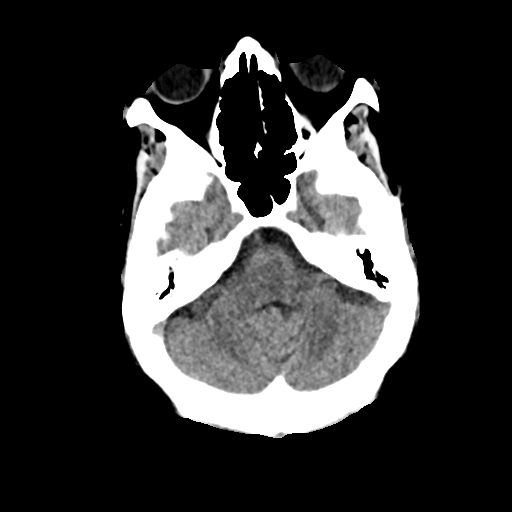
[im 13/33  brain]
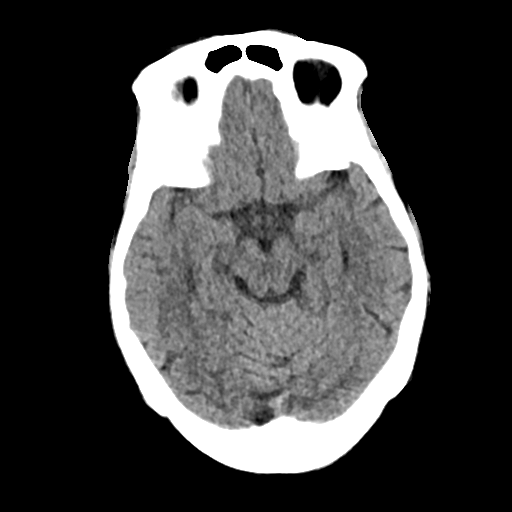
[im 17/33  brain]
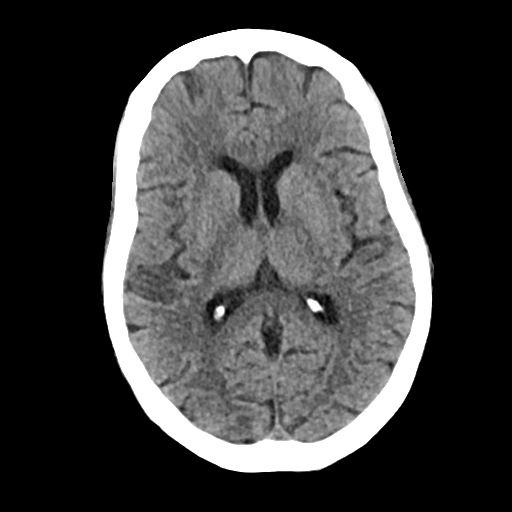
[im 21/33  brain]
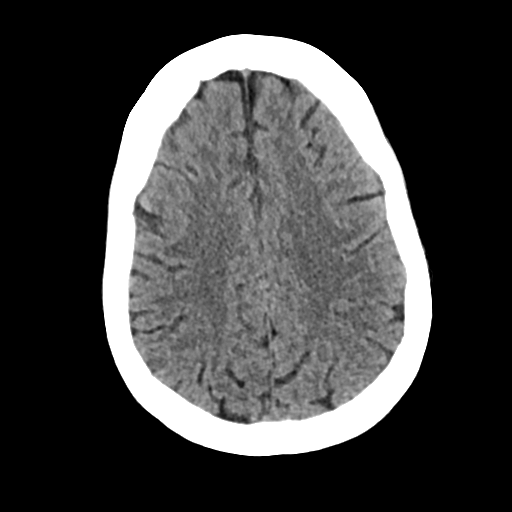
[im 21/33  bone]
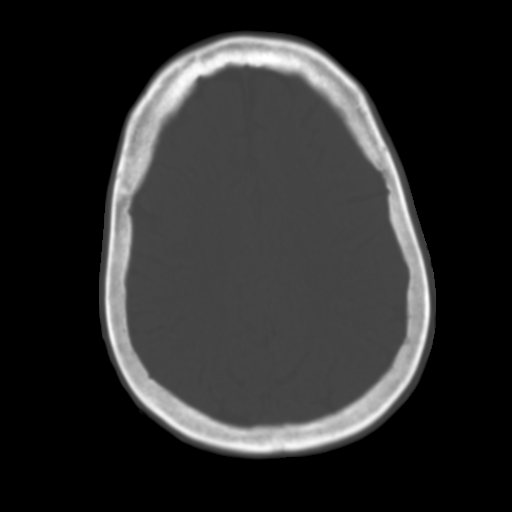
[im 25/33  brain]
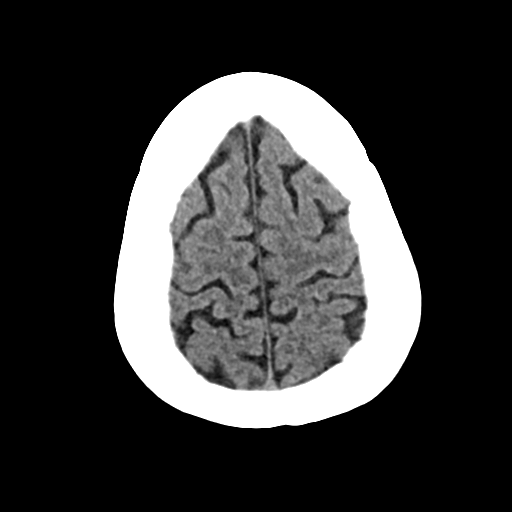
[im 29/33  brain]
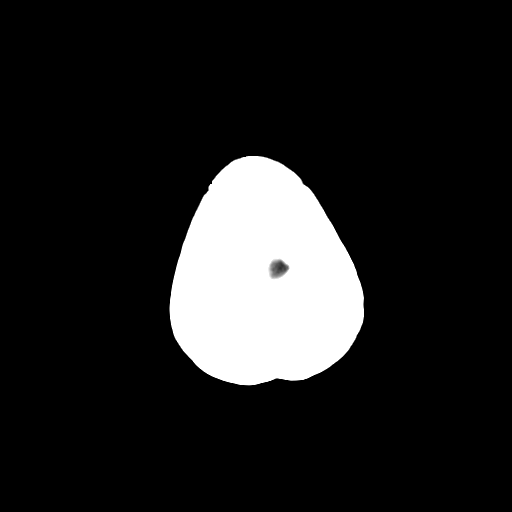

[Series 3: head bone · axial · 0.41mm/px · z∈[-658,-626]mm · 3 of 82 slices shown]
[im 9/82  bone]
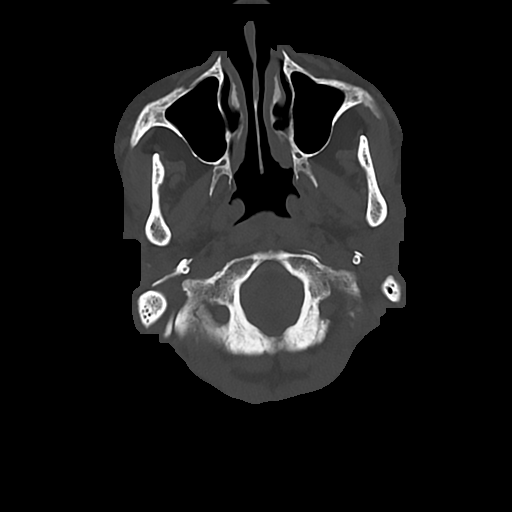
[im 17/82  bone]
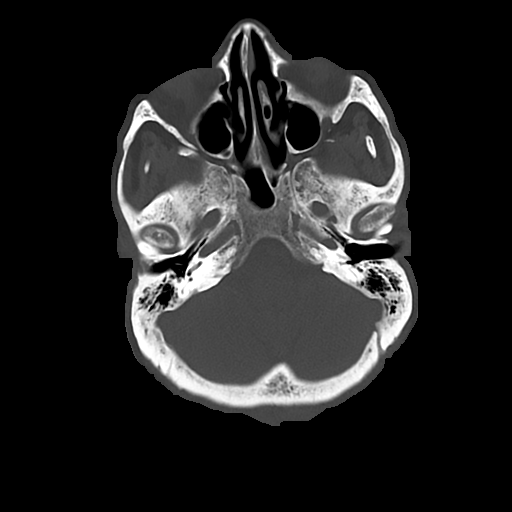
[im 25/82  bone]
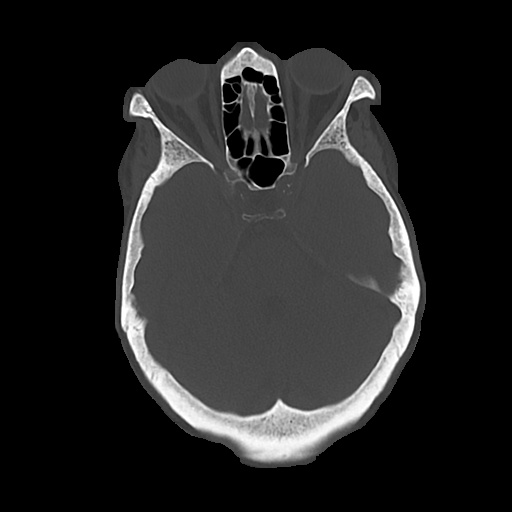

[Series 4: cor soft · coronal · 0.30mm/px · 3 of 69 slices shown]
[im 23/69  brain]
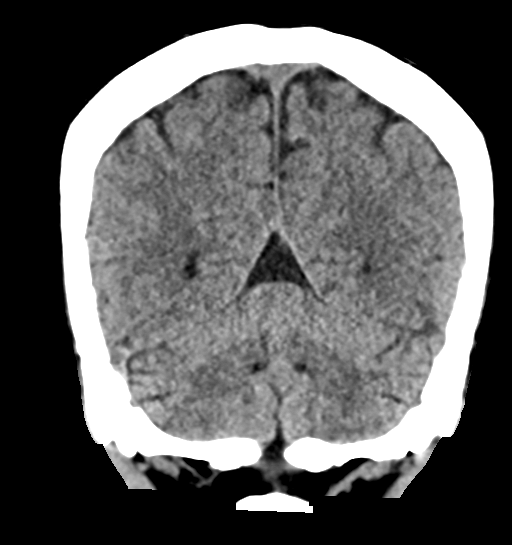
[im 31/69  brain]
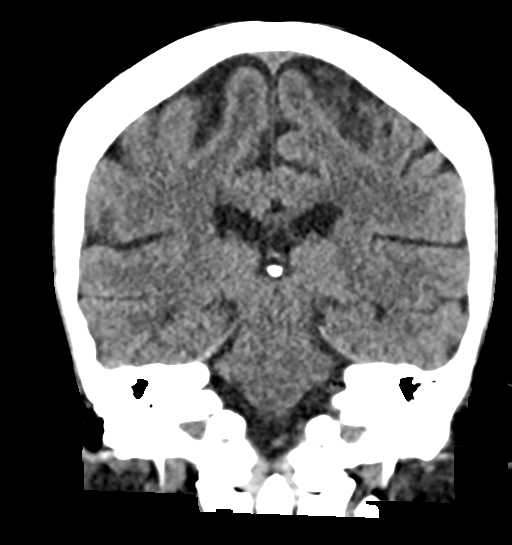
[im 38/69  brain]
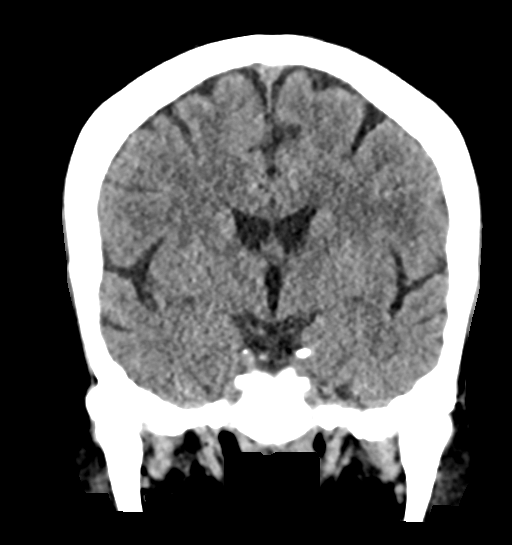

[Series 5: sag soft · sagittal · 0.33mm/px · 3 of 51 slices shown]
[im 17/51  brain]
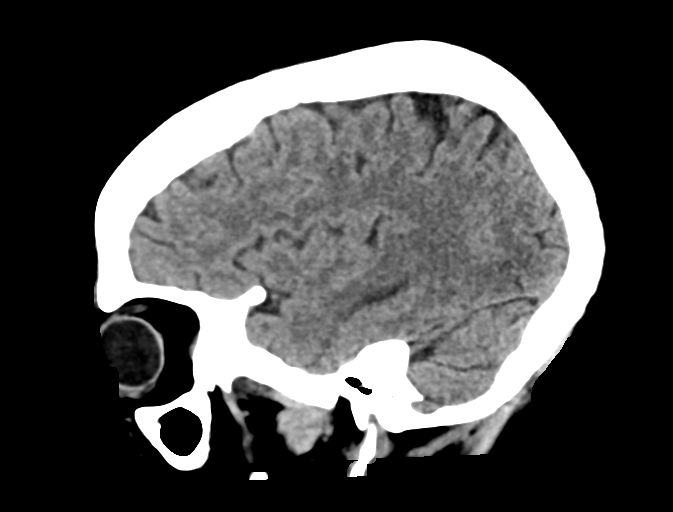
[im 26/51  brain]
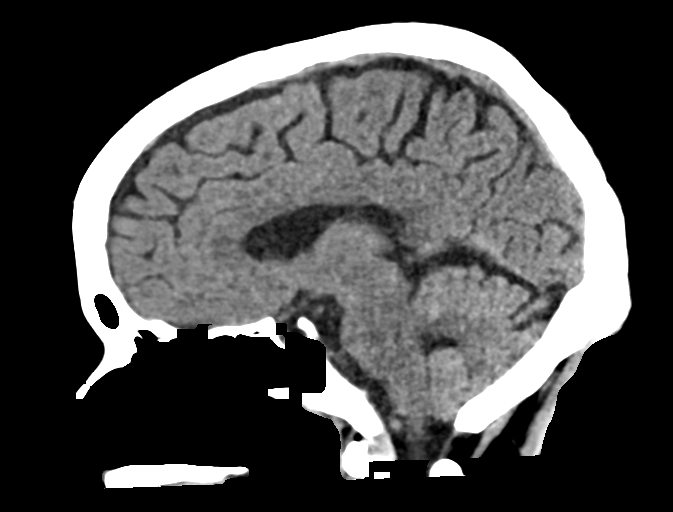
[im 34/51  brain]
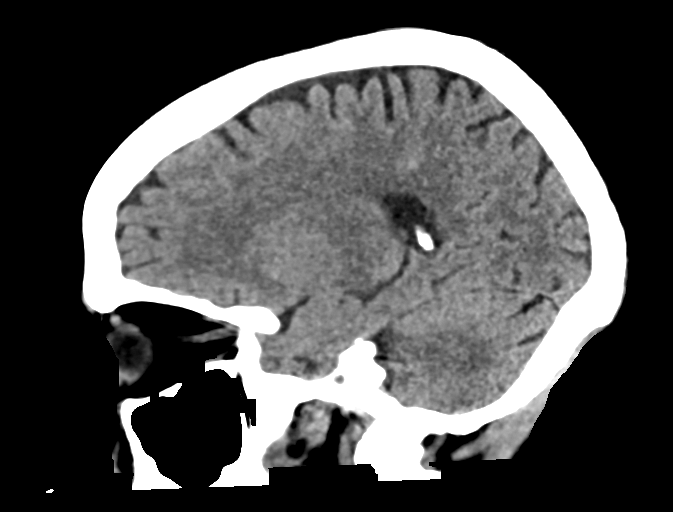

[16 of 47 positions shown; findings below may reference images not displayed]

FINDINGS: CT HEAD FINDINGS

Brain: No evidence of acute infarction, hemorrhage, hydrocephalus,
extra-axial collection or mass lesion/mass effect.

Mild subcortical white matter and periventricular small vessel
ischemic changes.

Vascular: No hyperdense vessel or unexpected calcification.

Skull: Normal. Negative for fracture or focal lesion.

Sinuses/Orbits: The visualized paranasal sinuses are essentially
clear. The mastoid air cells are unopacified.

Other: None.

CT CERVICAL SPINE FINDINGS

Alignment: Normal cervical lordosis.

Skull base and vertebrae: No acute fracture. No primary bone lesion
or focal pathologic process.

Soft tissues and spinal canal: No prevertebral fluid or swelling. No
visible canal hematoma.

Disc levels: Mild degenerative changes of the mid cervical spine.
Spinal canal is patent

Upper chest: 2.4 cm right thyroid nodule with coarse calcification
(series 4/image 90), chronic. This was previously evaluated on
remote thyroid biopsy. This has been evaluated on previous imaging.
(ref: [HOSPITAL]. [DATE]): 143-50).

Other: Visualized lungs are notable for layering moderate right and
trace left pleural effusions.
IMPRESSION: No evidence of acute intracranial abnormality. Mild small vessel
ischemic changes.

No evidence of traumatic injury to the cervical spine. Mild
degenerative changes.

Layering moderate right and trace left pleural effusions.

## 2022-06-14 IMAGING — CT CT CERVICAL SPINE W/O CM
4 series · 14 of 33 positions shown, 16 images · non-contrast
Comparison: 03/27/2021

CLINICAL DATA: Head/neck trauma

EXAM:
CT HEAD WITHOUT CONTRAST
CT CERVICAL SPINE WITHOUT CONTRAST
TECHNIQUE: Multidetector CT imaging of the head and cervical spine was
performed following the standard protocol without intravenous
contrast. Multiplanar CT image reconstructions of the cervical spine
were also generated.

[Series 4: c spine soft · axial · 0.32mm/px · z∈[-768,-740]mm · 2 of 99 slices shown]
[im 15/99  soft-tissue]
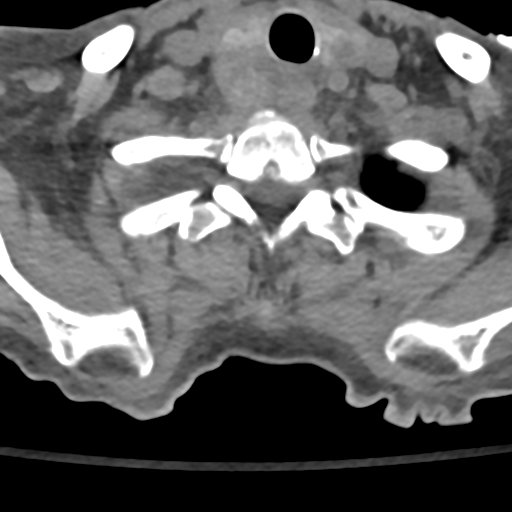
[im 29/99  soft-tissue]
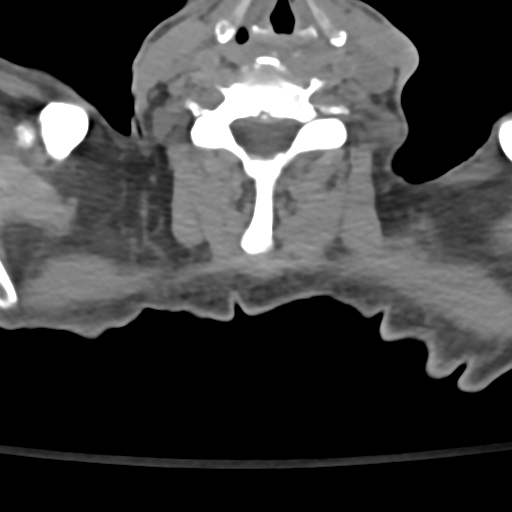

[Series 7: sag bone · sagittal · 0.29mm/px · 5 of 69 slices shown, 6 images]
[im 23/69  bone]
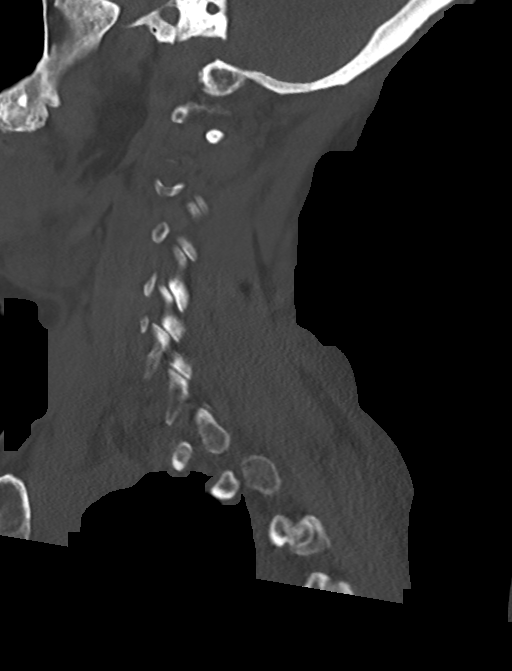
[im 29/69  bone]
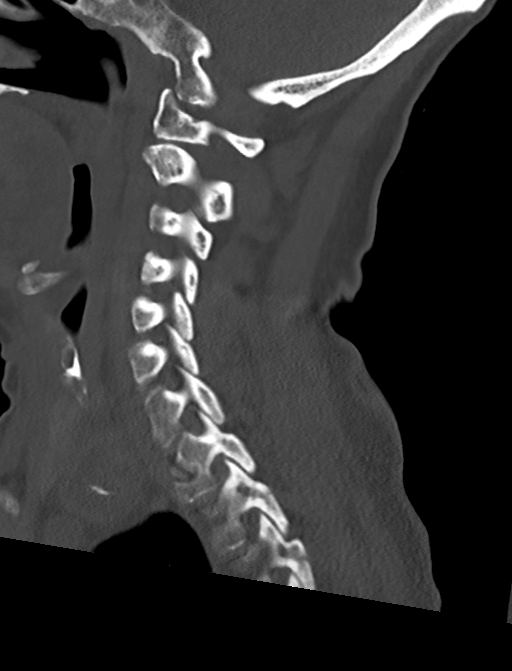
[im 35/69  soft-tissue]
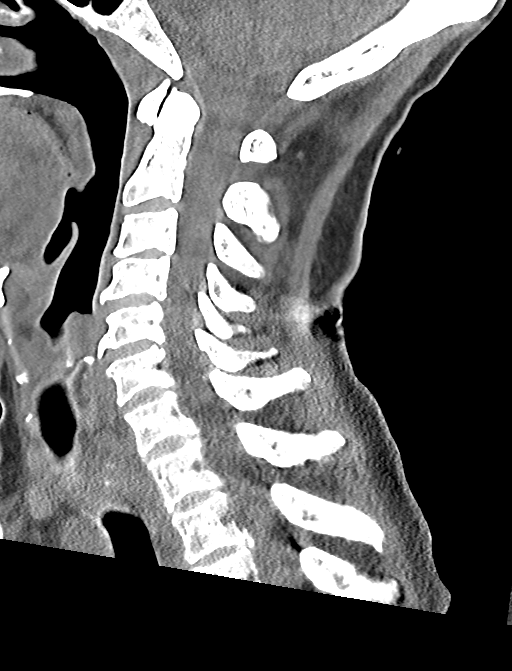
[im 35/69  bone]
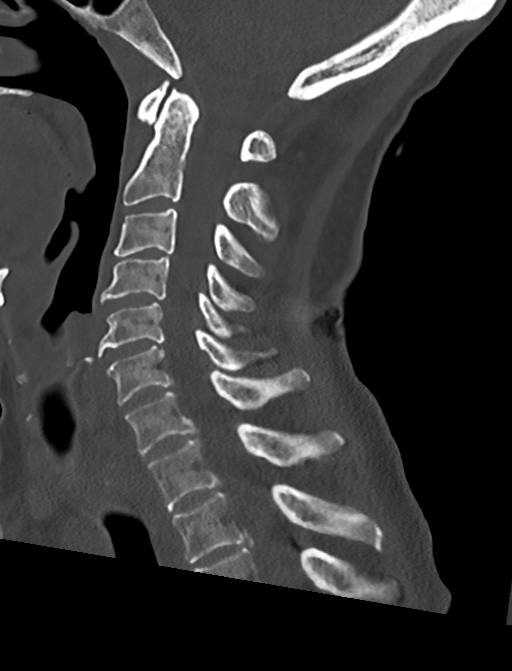
[im 40/69  bone]
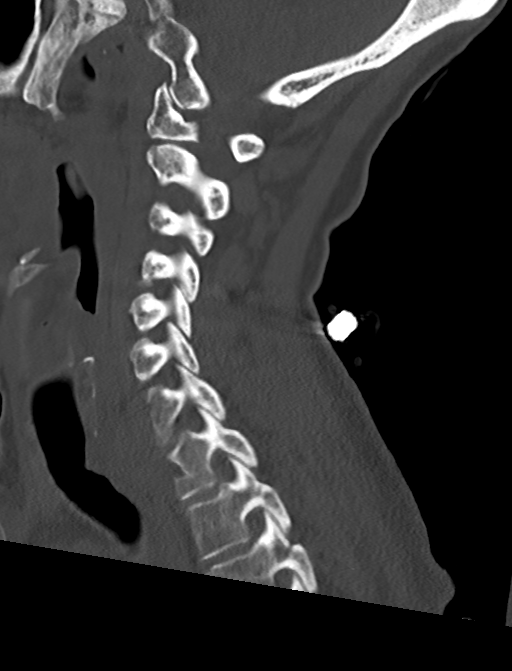
[im 46/69  bone]
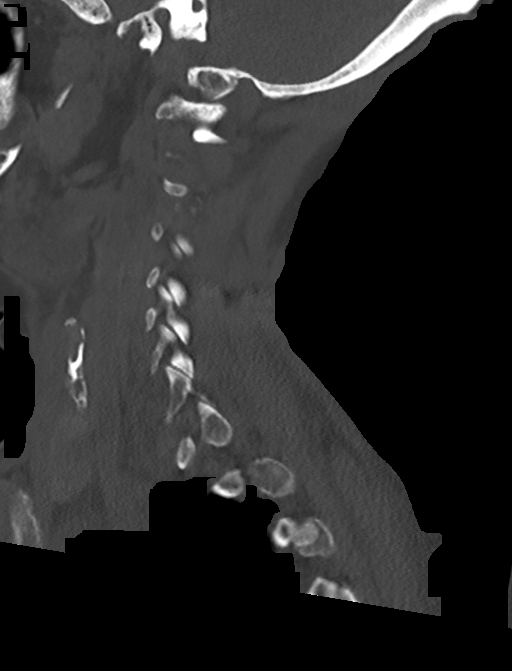

[Series 8: cor bone · coronal · 0.27mm/px · 3 of 72 slices shown]
[im 15/72  bone]
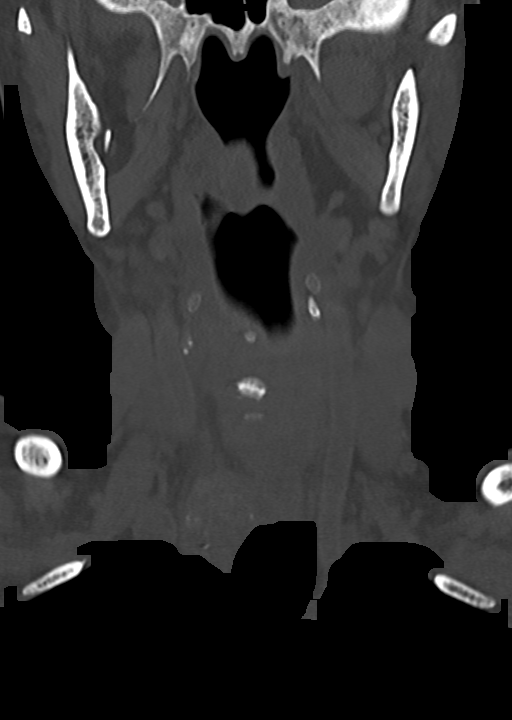
[im 29/72  bone]
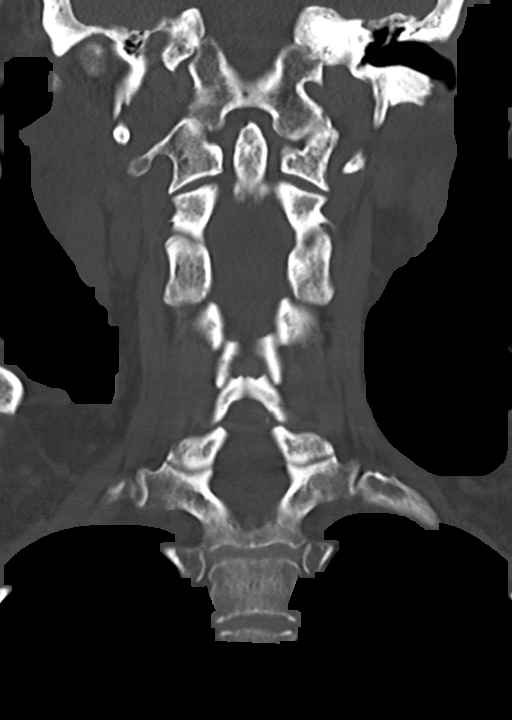
[im 43/72  bone]
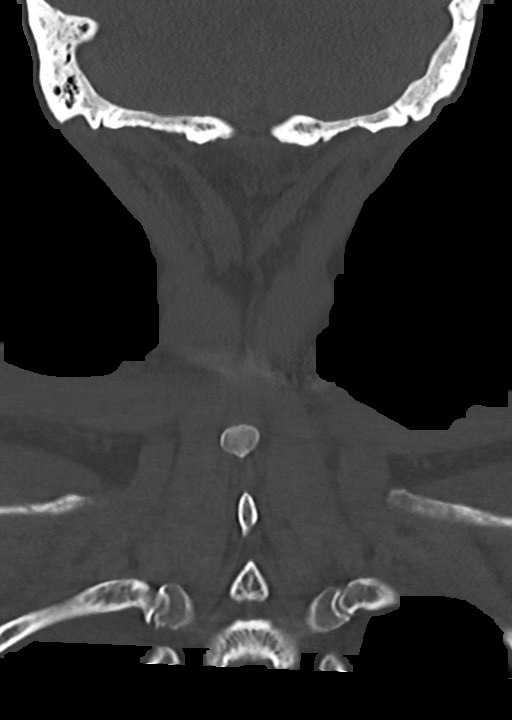

[Series 9: orthogonal axials · axial · 0.21mm/px · z∈[-787,-683]mm · 4 of 83 slices shown, 5 images]
[im 17/83  soft-tissue]
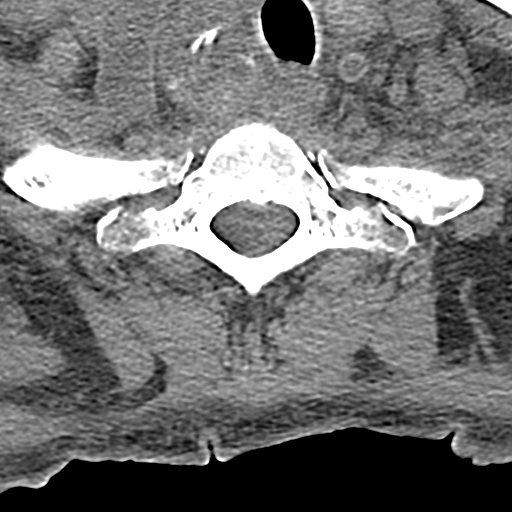
[im 17/83  bone]
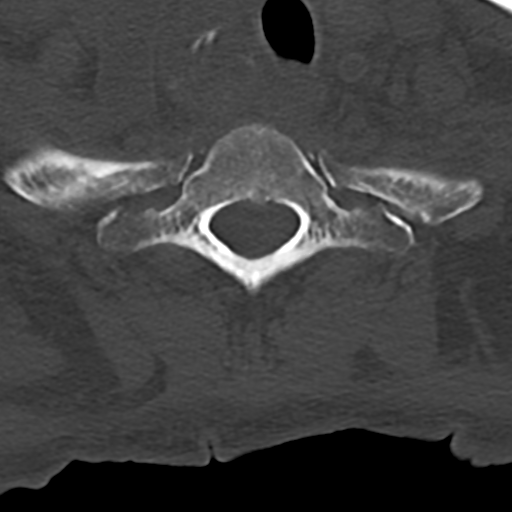
[im 33/83  bone]
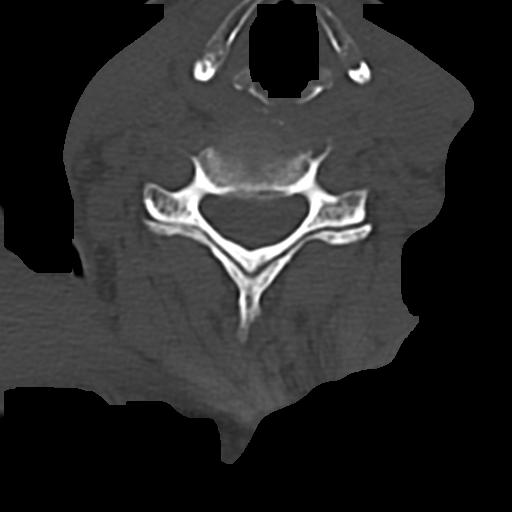
[im 50/83  bone]
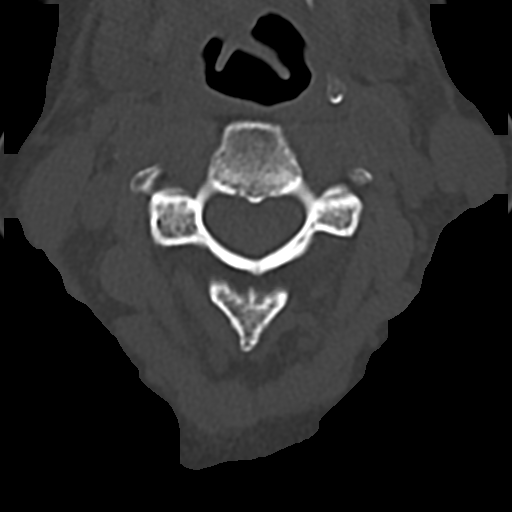
[im 66/83  bone]
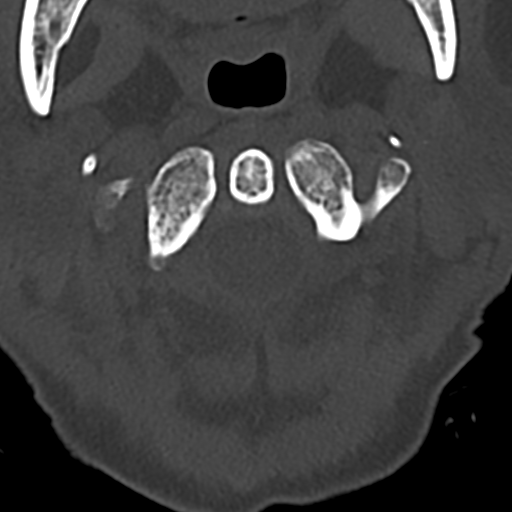

[14 of 33 positions shown; findings below may reference images not displayed]

FINDINGS: CT HEAD FINDINGS

Brain: No evidence of acute infarction, hemorrhage, hydrocephalus,
extra-axial collection or mass lesion/mass effect.

Mild subcortical white matter and periventricular small vessel
ischemic changes.

Vascular: No hyperdense vessel or unexpected calcification.

Skull: Normal. Negative for fracture or focal lesion.

Sinuses/Orbits: The visualized paranasal sinuses are essentially
clear. The mastoid air cells are unopacified.

Other: None.

CT CERVICAL SPINE FINDINGS

Alignment: Normal cervical lordosis.

Skull base and vertebrae: No acute fracture. No primary bone lesion
or focal pathologic process.

Soft tissues and spinal canal: No prevertebral fluid or swelling. No
visible canal hematoma.

Disc levels: Mild degenerative changes of the mid cervical spine.
Spinal canal is patent

Upper chest: 2.4 cm right thyroid nodule with coarse calcification
(series 4/image 90), chronic. This was previously evaluated on
remote thyroid biopsy. This has been evaluated on previous imaging.
(ref: [HOSPITAL]. [DATE]): 143-50).

Other: Visualized lungs are notable for layering moderate right and
trace left pleural effusions.
IMPRESSION: No evidence of acute intracranial abnormality. Mild small vessel
ischemic changes.

No evidence of traumatic injury to the cervical spine. Mild
degenerative changes.

Layering moderate right and trace left pleural effusions.

## 2022-06-15 IMAGING — CT CT HEAD CODE STROKE
3 series · 15 of 47 positions shown, 18 images · non-contrast
Comparison: None.

CLINICAL DATA: Code stroke.  Acute neurologic deficit

EXAM:
CT HEAD WITHOUT CONTRAST
TECHNIQUE: Contiguous axial images were obtained from the base of the skull
through the vertex without intravenous contrast.

[Series 4: head 5.0 h30s · axial · 0.42mm/px · z∈[-105,+20]mm · 9 of 31 slices shown, 12 images]
[im 3/31  brain]
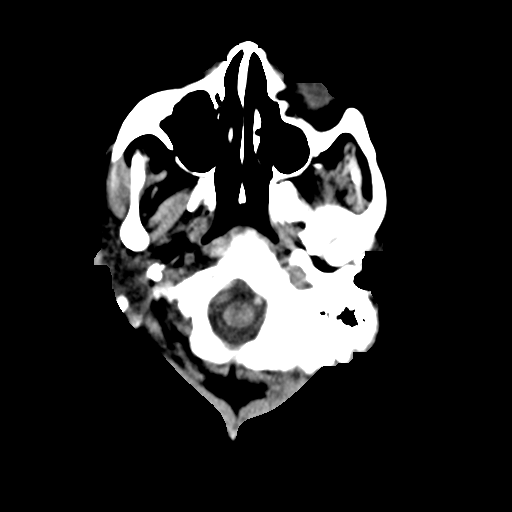
[im 3/31  bone]
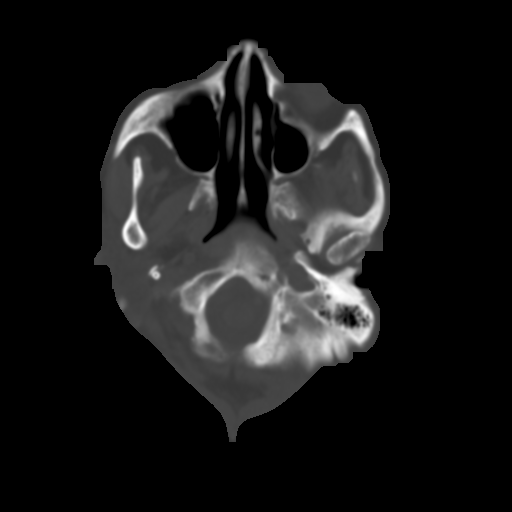
[im 6/31  brain]
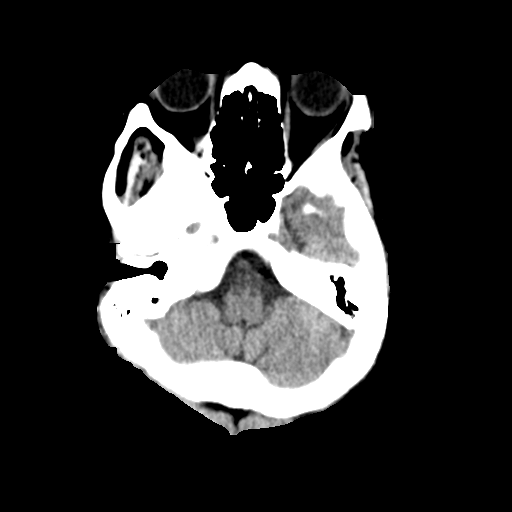
[im 9/31  brain]
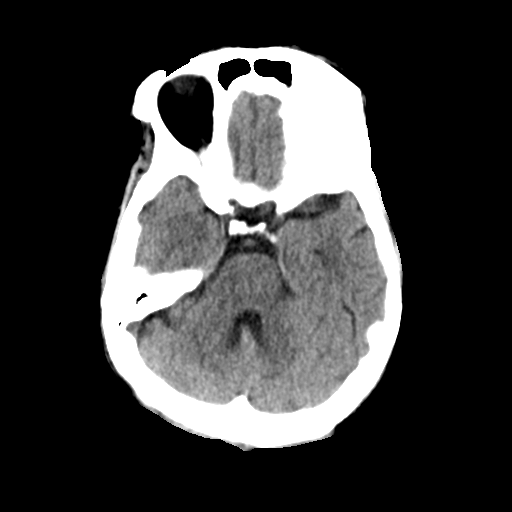
[im 12/31  brain]
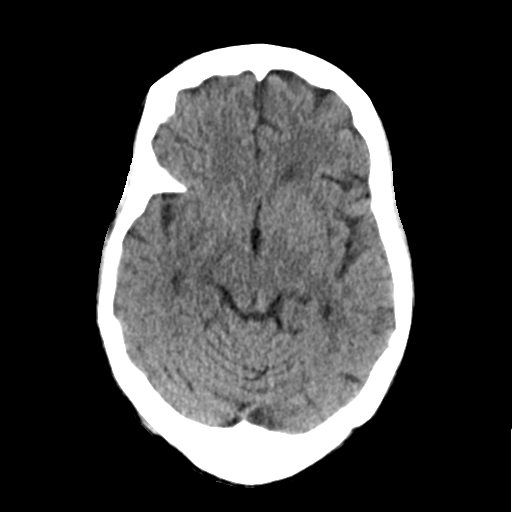
[im 16/31  brain]
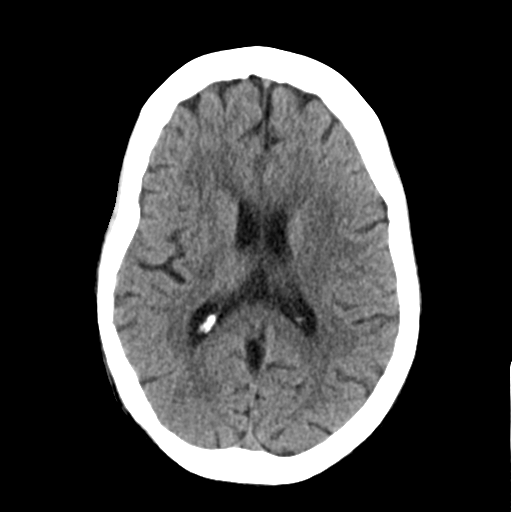
[im 16/31  bone]
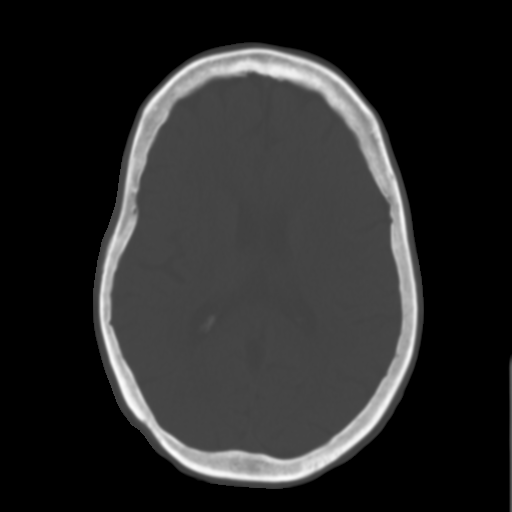
[im 19/31  brain]
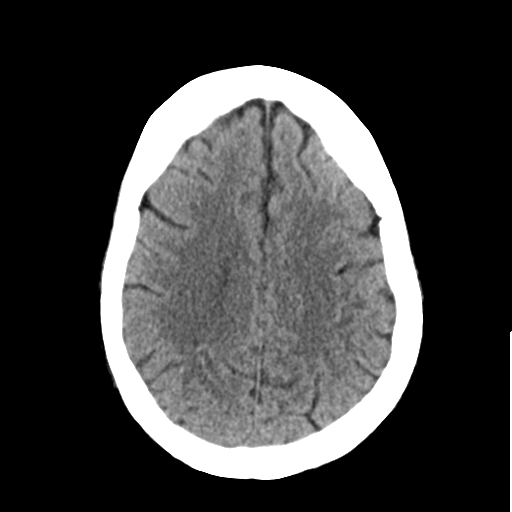
[im 22/31  brain]
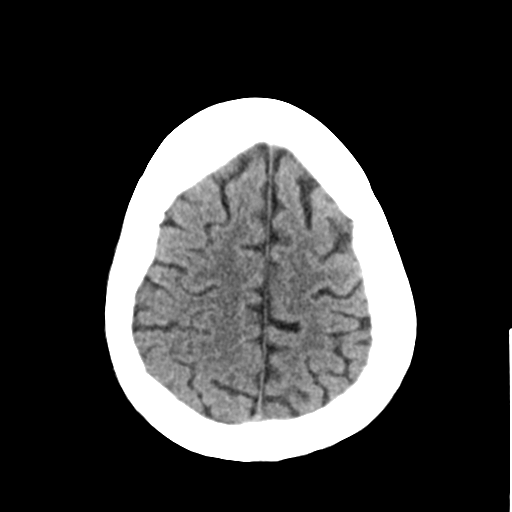
[im 25/31  brain]
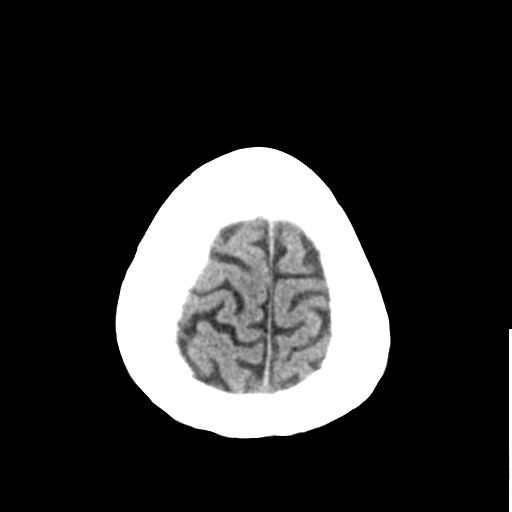
[im 28/31  brain]
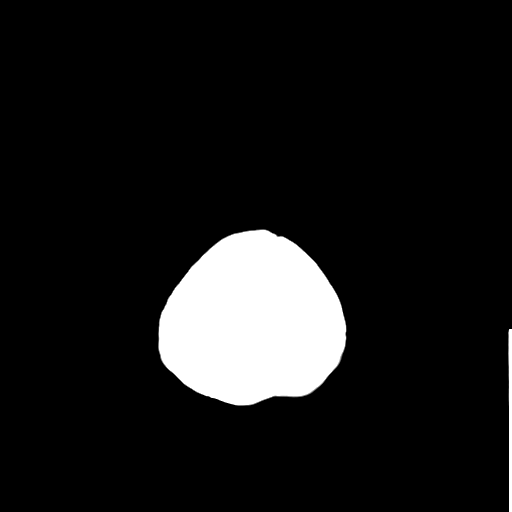
[im 28/31  bone]
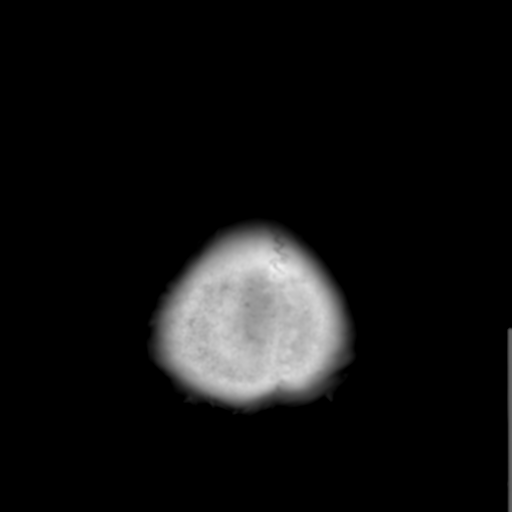

[Series 5: head 3.0 mpr cor · coronal · 0.29mm/px · 3 of 67 slices shown]
[im 23/67  brain]
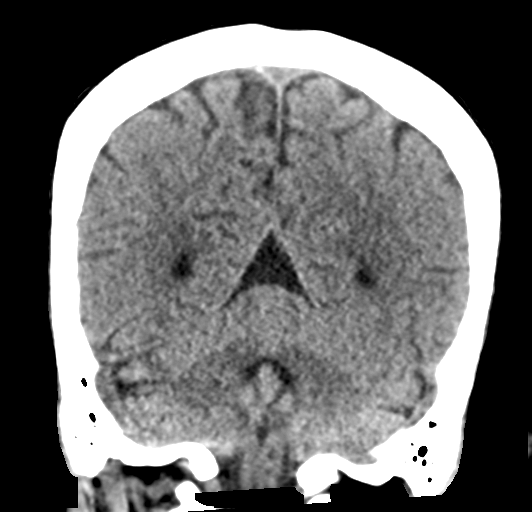
[im 30/67  brain]
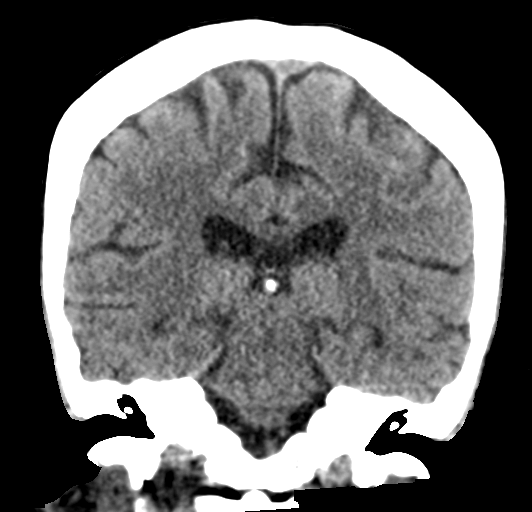
[im 37/67  brain]
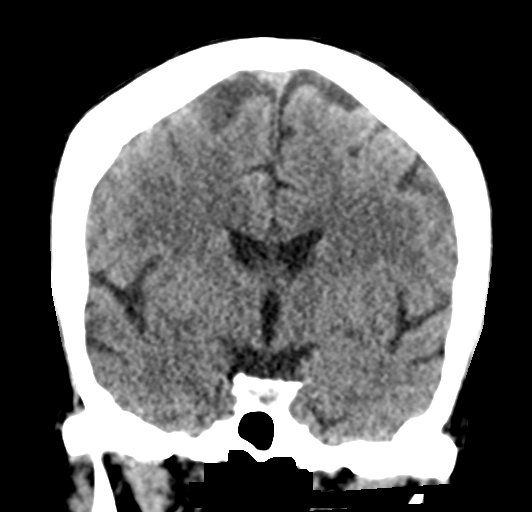

[Series 6: head 3.0 mpr sag · sagittal · 0.31mm/px · 3 of 51 slices shown]
[im 17/51  brain]
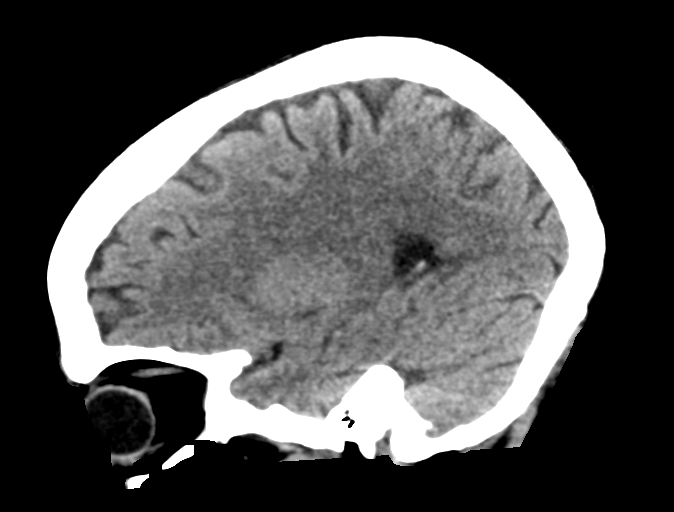
[im 26/51  brain]
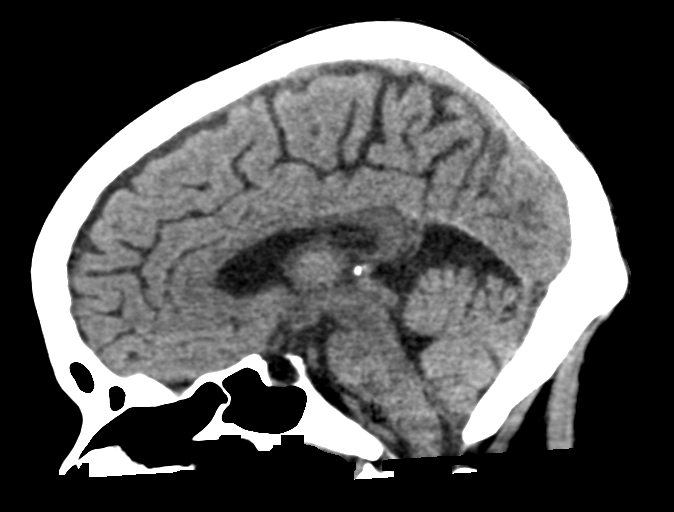
[im 34/51  brain]
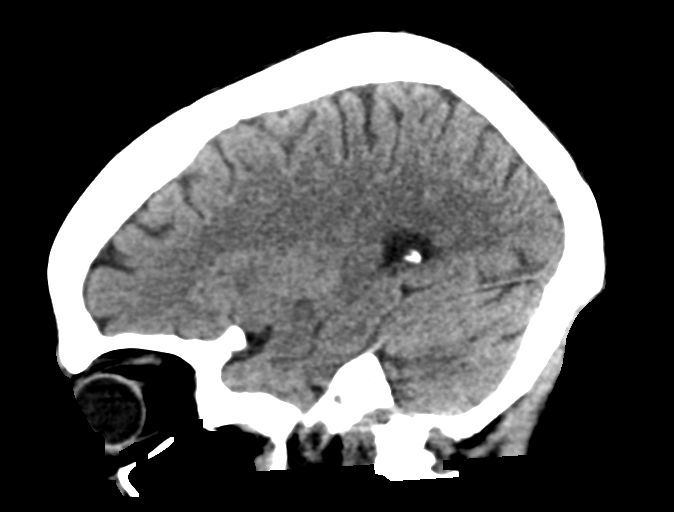

[15 of 47 positions shown; findings below may reference images not displayed]

FINDINGS: Brain: There is no mass, hemorrhage or extra-axial collection. The
size and configuration of the ventricles and extra-axial CSF spaces
are normal. The brain parenchyma is normal, without evidence of
acute or chronic infarction.

Vascular: No abnormal hyperdensity of the major intracranial
arteries or dural venous sinuses. No intracranial atherosclerosis.

Skull: The visualized skull base, calvarium and extracranial soft
tissues are normal.

Sinuses/Orbits: No fluid levels or advanced mucosal thickening of
the visualized paranasal sinuses. No mastoid or middle ear effusion.
The orbits are normal.

ASPECTS (Alberta Stroke Program Early CT Score)

- Ganglionic level infarction (caudate, lentiform nuclei, internal
capsule, insula, M1-M3 cortex): 7

- Supraganglionic infarction (M4-M6 cortex): 3

Total score (0-10 with 10 being normal): 10
IMPRESSION: 1. Normal head CT.
2. ASPECTS is 10.

These results were communicated to Dr. Yesica Vanesa Drada at [DATE]
on 07/14/2021 by text page via the [employer] messaging system.

## 2022-06-15 IMAGING — DX DG CHEST 1V PORT SAME DAY
1 series · 1 of 1 positions shown · non-contrast
Comparison: 07/12/2021

CLINICAL DATA: Dyspnea

EXAM:
PORTABLE CHEST 1 VIEW

[chest]
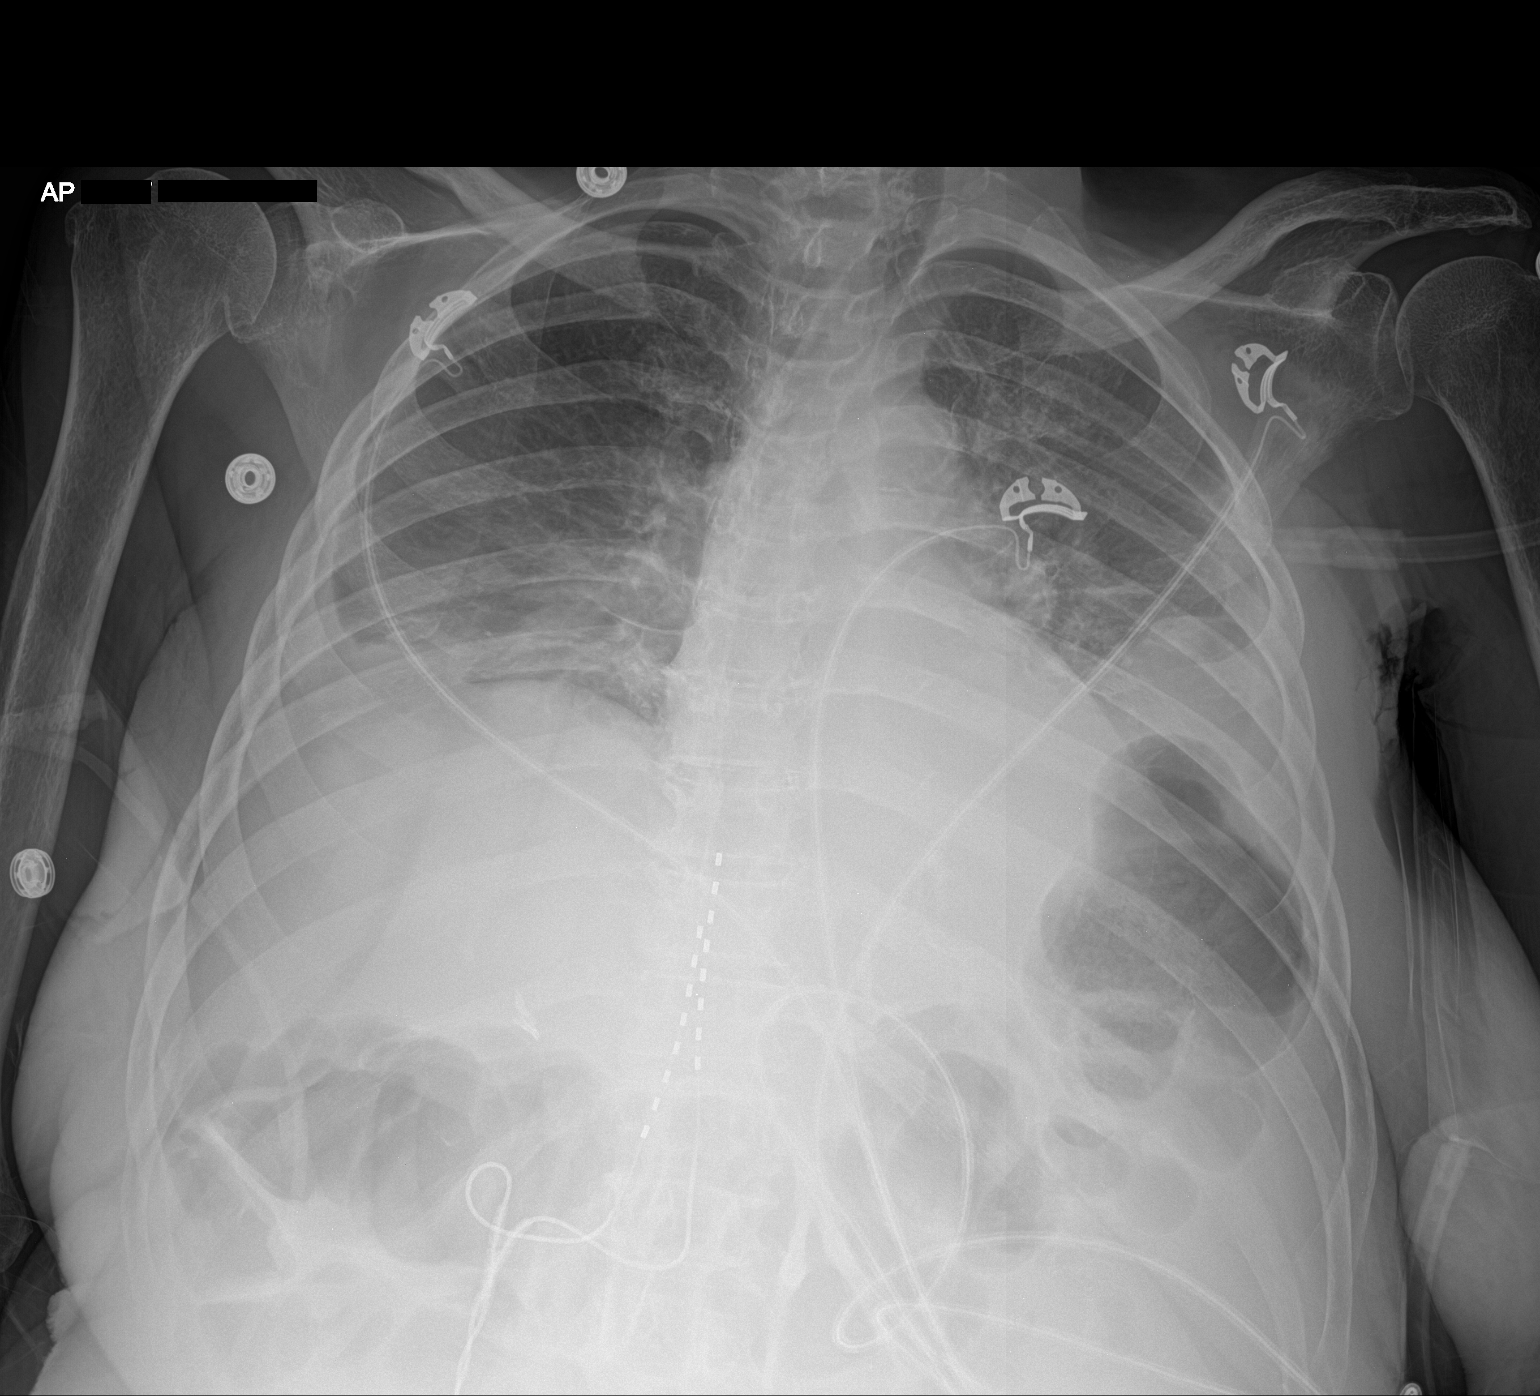

[1 of 1 positions shown; findings below may reference images not displayed]

FINDINGS: Stable heart size. Low lung volumes. Increasing bilateral pleural
effusions with associated bibasilar opacities. No pneumothorax.
IMPRESSION: Increasing bilateral pleural effusions with associated bibasilar
opacities, likely atelectasis.

## 2022-06-18 IMAGING — DX DG CHEST 1V PORT
1 series · 1 of 1 positions shown · non-contrast
Comparison: 07/17/2021

CLINICAL DATA: Central line placement

EXAM:
PORTABLE CHEST 1 VIEW

[chest ap]
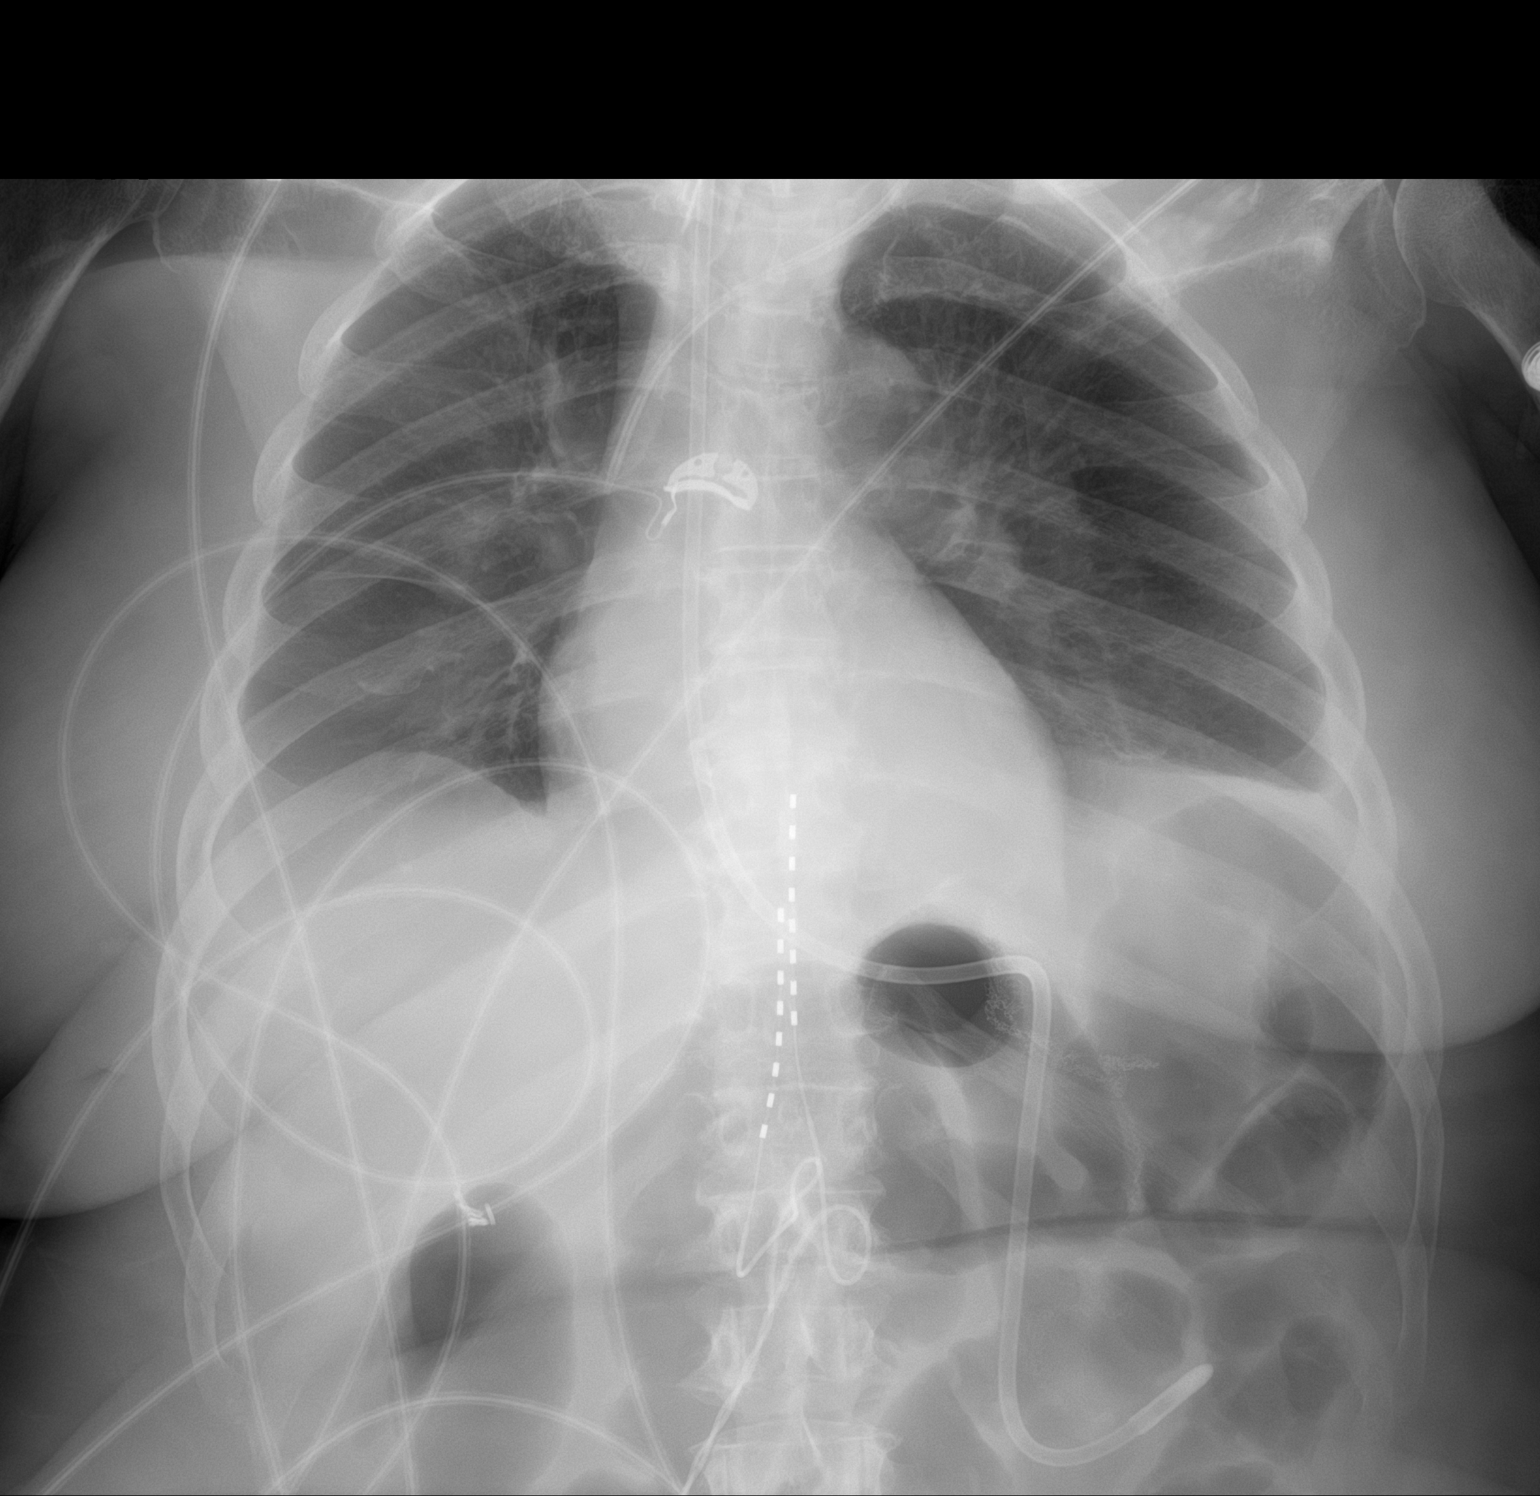

[1 of 1 positions shown; findings below may reference images not displayed]

FINDINGS: Endotracheal tube tip 5 cm above the carina. Soft feeding tube
enters the stomach. Left internal jugular central line tip in the
SVC 4 cm above the right atrium. No pneumothorax. Mild volume loss
in the lower lobes, left more than right.
IMPRESSION: Left internal jugular central line tip in the SVC 4 cm above the
right atrium. No pneumothorax. Persistent lower lobe volume loss
left worse than right.

## 2022-06-18 IMAGING — DX DG CHEST 1V PORT
1 series · 1 of 1 positions shown · non-contrast
Comparison: 07/14/2021

CLINICAL DATA: Intubation

EXAM:
PORTABLE CHEST 1 VIEW

[chest ap]
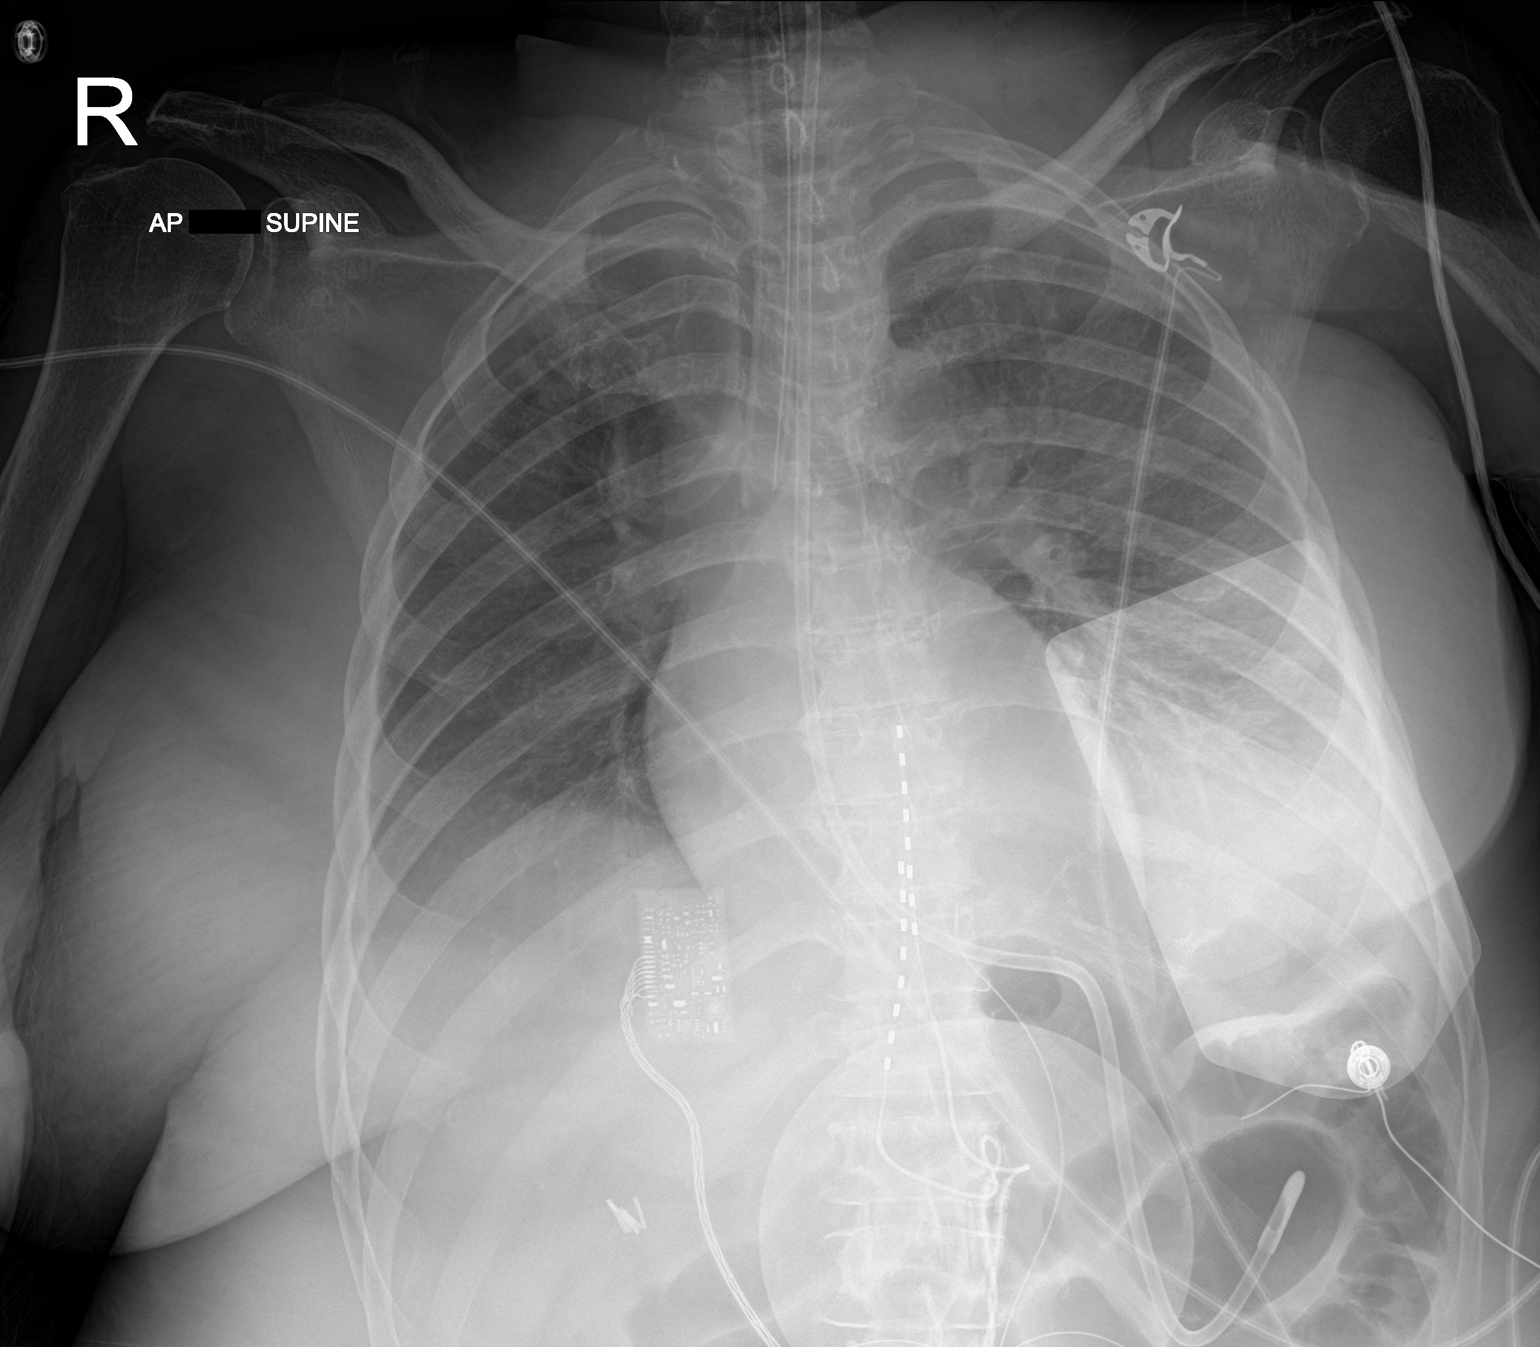

[1 of 1 positions shown; findings below may reference images not displayed]

FINDINGS: Interval placement of endotracheal tube with distal tip terminating
in the right mainstem bronchus. Interval placement of large bore
enteric tube, distal tip terminating in the gastric body. Normal
heart size. Hazy opacity within the left lung, likely atelectasis.
Small bilateral pleural effusions. No pneumothorax. No displaced
fracture is identified.
IMPRESSION: Interval placement of endotracheal tube with distal tip terminating
in the right mainstem bronchus. Retraction of 4-5 cm is recommended.

These results will be called to the ordering clinician or
representative by the Radiologist Assistant, and communication
documented in the PACS or [REDACTED].
